# Patient Record
Sex: Female | Born: 1960
Health system: Southern US, Community
[De-identification: ages and names within clinical notes are randomized; demographics above are authoritative.]

## PROBLEM LIST (undated history)

## (undated) DIAGNOSIS — Z973 Presence of spectacles and contact lenses: Secondary | ICD-10-CM

## (undated) DIAGNOSIS — IMO0002 Reserved for concepts with insufficient information to code with codable children: Secondary | ICD-10-CM

## (undated) DIAGNOSIS — Z87442 Personal history of urinary calculi: Secondary | ICD-10-CM

## (undated) DIAGNOSIS — I2510 Atherosclerotic heart disease of native coronary artery without angina pectoris: Secondary | ICD-10-CM

## (undated) DIAGNOSIS — M329 Systemic lupus erythematosus, unspecified: Secondary | ICD-10-CM

## (undated) DIAGNOSIS — R319 Hematuria, unspecified: Secondary | ICD-10-CM

## (undated) DIAGNOSIS — E785 Hyperlipidemia, unspecified: Secondary | ICD-10-CM

## (undated) DIAGNOSIS — Z720 Tobacco use: Secondary | ICD-10-CM

## (undated) DIAGNOSIS — N83209 Unspecified ovarian cyst, unspecified side: Secondary | ICD-10-CM

## (undated) DIAGNOSIS — F329 Major depressive disorder, single episode, unspecified: Secondary | ICD-10-CM

## (undated) DIAGNOSIS — I011 Acute rheumatic endocarditis: Secondary | ICD-10-CM

## (undated) DIAGNOSIS — E119 Type 2 diabetes mellitus without complications: Secondary | ICD-10-CM

## (undated) DIAGNOSIS — R519 Headache, unspecified: Secondary | ICD-10-CM

## (undated) DIAGNOSIS — I10 Essential (primary) hypertension: Secondary | ICD-10-CM

## (undated) DIAGNOSIS — F32A Depression, unspecified: Secondary | ICD-10-CM

## (undated) DIAGNOSIS — L732 Hidradenitis suppurativa: Secondary | ICD-10-CM

## (undated) DIAGNOSIS — N200 Calculus of kidney: Secondary | ICD-10-CM

## (undated) DIAGNOSIS — R350 Frequency of micturition: Secondary | ICD-10-CM

## (undated) DIAGNOSIS — K219 Gastro-esophageal reflux disease without esophagitis: Secondary | ICD-10-CM

## (undated) DIAGNOSIS — I2111 ST elevation (STEMI) myocardial infarction involving right coronary artery: Secondary | ICD-10-CM

## (undated) DIAGNOSIS — J189 Pneumonia, unspecified organism: Secondary | ICD-10-CM

## (undated) DIAGNOSIS — I639 Cerebral infarction, unspecified: Secondary | ICD-10-CM

## (undated) DIAGNOSIS — G4730 Sleep apnea, unspecified: Secondary | ICD-10-CM

## (undated) DIAGNOSIS — M419 Scoliosis, unspecified: Secondary | ICD-10-CM

## (undated) DIAGNOSIS — M069 Rheumatoid arthritis, unspecified: Secondary | ICD-10-CM

## (undated) DIAGNOSIS — Z9889 Other specified postprocedural states: Secondary | ICD-10-CM

## (undated) DIAGNOSIS — I25810 Atherosclerosis of coronary artery bypass graft(s) without angina pectoris: Secondary | ICD-10-CM

## (undated) DIAGNOSIS — E669 Obesity, unspecified: Secondary | ICD-10-CM

## (undated) DIAGNOSIS — F419 Anxiety disorder, unspecified: Secondary | ICD-10-CM

## (undated) DIAGNOSIS — Z95820 Peripheral vascular angioplasty status with implants and grafts: Secondary | ICD-10-CM

## (undated) DIAGNOSIS — R112 Nausea with vomiting, unspecified: Secondary | ICD-10-CM

## (undated) DIAGNOSIS — E236 Other disorders of pituitary gland: Secondary | ICD-10-CM

## (undated) HISTORY — PX: BREAST BIOPSY: SHX20

## (undated) HISTORY — DX: Hyperlipidemia, unspecified: E78.5

## (undated) HISTORY — PX: ABDOMINAL HYSTERECTOMY: SHX81

## (undated) HISTORY — DX: Depression, unspecified: F32.A

## (undated) HISTORY — DX: Cerebral infarction, unspecified: I63.9

## (undated) HISTORY — PX: DILATION AND CURETTAGE OF UTERUS: SHX78

## (undated) HISTORY — DX: Scoliosis, unspecified: M41.9

## (undated) HISTORY — PX: TUBAL LIGATION: SHX77

## (undated) HISTORY — DX: Anxiety disorder, unspecified: F41.9

## (undated) HISTORY — PX: TONSILLECTOMY: SUR1361

## (undated) HISTORY — DX: Major depressive disorder, single episode, unspecified: F32.9

---

## 1997-10-05 ENCOUNTER — Encounter: Admission: RE | Admit: 1997-10-05 | Discharge: 1997-10-05 | Payer: Self-pay | Admitting: Family Medicine

## 1997-11-08 ENCOUNTER — Encounter: Admission: RE | Admit: 1997-11-08 | Discharge: 1997-11-08 | Payer: Self-pay | Admitting: Family Medicine

## 1997-11-15 ENCOUNTER — Encounter: Admission: RE | Admit: 1997-11-15 | Discharge: 1997-11-15 | Payer: Self-pay | Admitting: Family Medicine

## 1997-12-04 ENCOUNTER — Encounter: Admission: RE | Admit: 1997-12-04 | Discharge: 1997-12-04 | Payer: Self-pay | Admitting: Family Medicine

## 1997-12-11 ENCOUNTER — Encounter: Admission: RE | Admit: 1997-12-11 | Discharge: 1997-12-11 | Payer: Self-pay | Admitting: Family Medicine

## 1997-12-11 ENCOUNTER — Other Ambulatory Visit: Admission: RE | Admit: 1997-12-11 | Discharge: 1997-12-11 | Payer: Self-pay | Admitting: *Deleted

## 1997-12-27 ENCOUNTER — Encounter: Admission: RE | Admit: 1997-12-27 | Discharge: 1997-12-27 | Payer: Self-pay | Admitting: Family Medicine

## 1998-01-02 ENCOUNTER — Encounter: Admission: RE | Admit: 1998-01-02 | Discharge: 1998-01-02 | Payer: Self-pay | Admitting: Sports Medicine

## 1998-02-20 ENCOUNTER — Encounter: Admission: RE | Admit: 1998-02-20 | Discharge: 1998-02-20 | Payer: Self-pay | Admitting: Sports Medicine

## 1998-03-02 ENCOUNTER — Encounter: Admission: RE | Admit: 1998-03-02 | Discharge: 1998-03-02 | Payer: Self-pay | Admitting: Family Medicine

## 1998-03-20 ENCOUNTER — Encounter: Admission: RE | Admit: 1998-03-20 | Discharge: 1998-03-20 | Payer: Self-pay | Admitting: Sports Medicine

## 2006-11-19 ENCOUNTER — Encounter: Payer: Self-pay | Admitting: Family Medicine

## 2010-08-21 ENCOUNTER — Inpatient Hospital Stay (INDEPENDENT_AMBULATORY_CARE_PROVIDER_SITE_OTHER)
Admission: RE | Admit: 2010-08-21 | Discharge: 2010-08-21 | Disposition: A | Discharge status: Home / Self Care | Payer: Self-pay | Source: Ambulatory Visit | Attending: Emergency Medicine | Admitting: Emergency Medicine

## 2010-08-21 DIAGNOSIS — I10 Essential (primary) hypertension: Secondary | ICD-10-CM

## 2010-08-21 LAB — POCT I-STAT, CHEM 8
BUN: 9 mg/dL (ref 6–23)
Calcium, Ion: 1.2 mmol/L (ref 1.12–1.32)
Chloride: 105 mEq/L (ref 96–112)
Creatinine, Ser: 0.8 mg/dL (ref 0.4–1.2)
Glucose, Bld: 94 mg/dL (ref 70–99)
HCT: 47 % — ABNORMAL HIGH (ref 36.0–46.0)
Hemoglobin: 16 g/dL — ABNORMAL HIGH (ref 12.0–15.0)
Potassium: 3.3 mEq/L — ABNORMAL LOW (ref 3.5–5.1)
Sodium: 143 mEq/L (ref 135–145)
TCO2: 27 mmol/L (ref 0–100)

## 2011-05-21 ENCOUNTER — Encounter (HOSPITAL_COMMUNITY): Payer: Self-pay | Admitting: *Deleted

## 2011-05-21 ENCOUNTER — Emergency Department (INDEPENDENT_AMBULATORY_CARE_PROVIDER_SITE_OTHER)
Admission: EM | Admit: 2011-05-21 | Discharge: 2011-05-21 | Disposition: A | Discharge status: Home Health | Payer: Self-pay | Source: Home / Self Care | Attending: Family Medicine | Admitting: Family Medicine

## 2011-05-21 DIAGNOSIS — L02411 Cutaneous abscess of right axilla: Secondary | ICD-10-CM

## 2011-05-21 DIAGNOSIS — L0291 Cutaneous abscess, unspecified: Secondary | ICD-10-CM

## 2011-05-21 DIAGNOSIS — IMO0002 Reserved for concepts with insufficient information to code with codable children: Secondary | ICD-10-CM

## 2011-05-21 DIAGNOSIS — L0390 Cellulitis, unspecified: Secondary | ICD-10-CM

## 2011-05-21 HISTORY — DX: Essential (primary) hypertension: I10

## 2011-05-21 HISTORY — DX: Systemic lupus erythematosus, unspecified: M32.9

## 2011-05-21 HISTORY — DX: Reserved for concepts with insufficient information to code with codable children: IMO0002

## 2011-05-21 MED ORDER — HYDROCODONE-ACETAMINOPHEN 5-325 MG PO TABS: ORAL_TABLET | ORAL | Status: AC

## 2011-05-21 MED ORDER — SULFAMETHOXAZOLE-TRIMETHOPRIM 800-160 MG PO TABS: 1.0000 | ORAL_TABLET | Freq: Two times a day (BID) | ORAL | Status: AC

## 2011-05-21 NOTE — ED Notes (Signed)
Pt c/o "knots" under both arms and on breasts for a long time.  States that they come and go, sometimes they drain, sometimes they don't.  States they are painful, can't put deodorant on because of the pain.  Has one on left inner breast that is open, no drainage noted at present time.  Pt has been out of meds for HTN, RA and Lupus for several months.

## 2011-05-21 NOTE — Discharge Instructions (Signed)
Take antibiotics as directed. Apply warm compresses to affected areas 2-3 times a day. Return to care. Should symptoms not improve, or worsen in any way. Or if you have any fever.

## 2011-05-21 NOTE — ED Provider Notes (Signed)
History     CSN: [id number]  Arrival date & time 05/21/11  0804   First MD Initiated Contact with Patient 05/21/11 0813      Chief Complaint  Patient presents with  . Recurrent Skin Infections    (Consider location/radiation/quality/duration/timing/severity/associated sxs/prior treatment) HPI Comments: [name] [name] presents for evaluation of multiple painful, "knots" underneath her right armpit. She also reports 1 over her left breast. She reports a 4 year history of these abscesses. She reports that they mostly spontaneously drained themselves and, that she's never seen a provider for these.  [name] presenting with abscess. The history is provided by the patient.  Abscess  This is a recurrent problem. The problem occurs frequently. The problem has been unchanged. The abscess is present on the right arm (LEFT breast and RIGHT axilla). The abscess is characterized by painfulness and draining.    Past Medical History  Diagnosis Date  . Arthritis   . Hypertension   . Lupus     Past Surgical History  Procedure Date  . Abdominal hysterectomy   . Breast biopsy     No family history on file.  History  Substance Use Topics  . Smoking status: Current Everyday Smoker -- 0.5 packs/day    Types: Cigarettes  . Smokeless tobacco: Not on file  . Alcohol Use: No    OB History    Grav Para Term Preterm Abortions TAB SAB Ect Mult Living                  Review of Systems  Constitutional: Negative.   HENT: Negative.   Eyes: Negative.   Respiratory: Negative.   Cardiovascular: Negative.   Gastrointestinal: Negative.   Genitourinary: Negative.   Musculoskeletal: Negative.   Skin: Negative.        Abscesses   Neurological: Negative.     Allergies  Review of patient's allergies indicates no known allergies.  Home Medications   Current Outpatient Rx  Name Route Sig Dispense Refill  . ACETAMINOPHEN ER 650 MG PO TBCR Oral Take 1,300 mg by mouth every 8  (eight) hours as needed.    Marland Kitchen HYDROCODONE-ACETAMINOPHEN 5-325 MG PO TABS  Take one to two tablets every 4 to 6 hours as needed for pain 20 tablet 0  . SULFAMETHOXAZOLE-TRIMETHOPRIM 800-160 MG PO TABS Oral Take 1 tablet by mouth 2 (two) times daily. 14 tablet 0    BP 175/96  Pulse 90  Temp(Src) 98 F (36.7 C) (Oral)  Resp 18  SpO2 96%  Physical Exam  Nursing note and vitals reviewed. Constitutional: She is oriented to person, place, and time. She appears well-developed and well-nourished.  HENT:  Head: Normocephalic and atraumatic.  Eyes: EOM are normal.  Neck: Normal range of motion.  Pulmonary/Chest: Effort normal.  Musculoskeletal: Normal range of motion.  Neurological: She is alert and oriented to person, place, and time.  Skin: Skin is warm and dry. Lesion noted.          Multiple abscesses beneath RIGHT axilla; one 1 cm lesion over LEFT breast near sternal border, draining bloody and purulent discharge  Psychiatric: Her behavior is normal.    ED Course  INCISION AND DRAINAGE Date/Time: 05/21/2011 9:20 AM Performed by: Richardo Priest Authorized by: Richardo Priest Consent: Verbal consent obtained. Risks and benefits: risks, benefits and alternatives were discussed Consent given by: patient Patient understanding: patient states understanding of the procedure being performed Site marked: the operative site was marked Patient identity confirmed: verbally  with patient and arm band Type: abscess Body area: upper extremity (RIGHT axilla) Anesthesia: local infiltration Local anesthetic: lidocaine 2% without epinephrine Anesthetic total: 4 ml Patient sedated: no Scalpel size: 11 Needle gauge: 27. Incision type: single straight Complexity: simple Drainage: bloody Drainage amount: scant Wound treatment: wound left open   (including critical care time)  Labs Reviewed - No data to display No results found.   1. Abscess of axilla, right   2. Abscess and cellulitis        MDM  I&D RIGHT axillary abscess; rx for TMP-SMX given with hydrocodone PRN; warm compresses, return if no improvement        Richardo Priest, MD 05/21/11 [phone]

## 2011-07-28 ENCOUNTER — Emergency Department (HOSPITAL_COMMUNITY): Payer: Self-pay

## 2011-07-28 ENCOUNTER — Emergency Department (HOSPITAL_COMMUNITY)
Admission: EM | Admit: 2011-07-28 | Discharge: 2011-07-28 | Disposition: A | Discharge status: Home / Self Care | Payer: Self-pay | Attending: Emergency Medicine | Admitting: Emergency Medicine

## 2011-07-28 ENCOUNTER — Encounter (HOSPITAL_COMMUNITY): Payer: Self-pay | Admitting: Emergency Medicine

## 2011-07-28 DIAGNOSIS — R0602 Shortness of breath: Secondary | ICD-10-CM | POA: Insufficient documentation

## 2011-07-28 DIAGNOSIS — R079 Chest pain, unspecified: Secondary | ICD-10-CM | POA: Insufficient documentation

## 2011-07-28 DIAGNOSIS — Z8739 Personal history of other diseases of the musculoskeletal system and connective tissue: Secondary | ICD-10-CM | POA: Insufficient documentation

## 2011-07-28 DIAGNOSIS — I10 Essential (primary) hypertension: Secondary | ICD-10-CM | POA: Insufficient documentation

## 2011-07-28 DIAGNOSIS — R51 Headache: Secondary | ICD-10-CM | POA: Insufficient documentation

## 2011-07-28 DIAGNOSIS — M329 Systemic lupus erythematosus, unspecified: Secondary | ICD-10-CM | POA: Insufficient documentation

## 2011-07-28 LAB — BASIC METABOLIC PANEL
BUN: 9 mg/dL (ref 6–23)
CO2: 28 mEq/L (ref 19–32)
Calcium: 10 mg/dL (ref 8.4–10.5)
Chloride: 101 mEq/L (ref 96–112)
Creatinine, Ser: 0.72 mg/dL (ref 0.50–1.10)
GFR calc Af Amer: >90 mL/min (ref >90)
GFR calc non Af Amer: >90 mL/min (ref >90)
Glucose, Bld: 84 mg/dL (ref 70–99)
Potassium: 3.9 mEq/L (ref 3.5–5.1)
Sodium: 139 mEq/L (ref 135–145)

## 2011-07-28 LAB — CBC
HCT: 47.2 % — ABNORMAL HIGH (ref 36.0–46.0)
Hemoglobin: 15.9 g/dL — ABNORMAL HIGH (ref 12.0–15.0)
MCH: 29.8 pg (ref 26.0–34.0)
MCHC: 33.7 g/dL (ref 30.0–36.0)
MCV: 88.6 fL (ref 78.0–100.0)
Platelets: 335 10*3/uL (ref 150–400)
RBC: 5.33 MIL/uL — ABNORMAL HIGH (ref 3.87–5.11)
RDW: 15.3 % (ref 11.5–15.5)
WBC: 11.7 10*3/uL — ABNORMAL HIGH (ref 4.0–10.5)

## 2011-07-28 LAB — TROPONIN I: Troponin I: <0.3 ng/mL (ref <0.30)

## 2011-07-28 MED ORDER — HYDROCHLOROTHIAZIDE 25 MG PO TABS: 25.0000 mg | ORAL_TABLET | Freq: Every day | ORAL | Status: DC

## 2011-07-28 MED ORDER — LISINOPRIL-HYDROCHLOROTHIAZIDE 20-12.5 MG PO TABS: 1.0000 | ORAL_TABLET | Freq: Every day | ORAL | Status: DC

## 2011-07-28 MED ORDER — KETOROLAC TROMETHAMINE 30 MG/ML IJ SOLN
30.0000 mg | Freq: Once | INTRAMUSCULAR | Status: AC
  Administered 2011-07-28: 30 mg via INTRAVENOUS
  Filled 2011-07-28: qty 1

## 2011-07-28 MED ORDER — SODIUM CHLORIDE 0.9 % IV BOLUS (SEPSIS)
1000.0000 mL | Freq: Once | INTRAVENOUS | Status: AC
  Administered 2011-07-28: 1000 mL via INTRAVENOUS

## 2011-07-28 NOTE — Discharge Instructions (Signed)
Return here if any worsening in your condition.  Followup with resources below her primary doctor.    RESOURCE GUIDE  Dental Problems  Patients with Medicaid: [facility] [phone]                                           [phone] [address] Phone:  [phone]                                                  Phone:  [phone]  If unable to pay or uninsured, contact:  [facility] or [facility]. to become qualified for the adult dental clinic.  Chronic Pain Problems Contact [facility]  [phone] Patients need to be referred by their primary care doctor.  Insufficient Money for Medicine Contact [employer]:  call "211" or [facility] [phone].  No Primary Care Doctor Call [facility]  [phone] Other agencies that provide inexpensive medical care    [facility]  [phone]    [facility]  [phone]    [facility]  [phone]    [facility]  [phone]    [facility]  [phone]    [facility]  [phone]  Psychological Services [facility]  [phone] [facility] Services  [phone] [facility]   [phone] (emergency services [phone])  Substance Abuse Resources [facility]  [phone] [facility] 7813866391 [facility] [place] [phone] Floydene Flock [phone] Residential & Outpatient Substance Abuse Program  [phone]  Abuse/Neglect [facility] Child Abuse Hotline 4097242023 [facility] Child Abuse Hotline 650-512-5958 (After Hours)  Emergency Shelter [facility] [phone]  Maternity Homes [facility] at [facility] [place] [facility] [place] [phone] [facility] [phone]  MRSA Hotline #:   505-290-7142    [facility] Resources  [facility]     [employer]                           [facility] Dept. [address] [place]                       [address]      [address]                                                  Cristobal Goldmann Phone:  [phone]                                   Phone:  [phone]  Phone:  [phone]  [facility] Phone:  [phone]  [facility] Child Abuse Hotline [phone])050-1860 [phone] (After Hours)

## 2011-07-28 NOTE — ED Provider Notes (Signed)
History     CSN: [id number]  Arrival date & time 07/28/11  1652   First MD Initiated Contact with Patient 07/28/11 2035      Chief Complaint  Patient presents with  . Hypertension  . Chest Pain    (Consider location/radiation/quality/duration/timing/severity/associated sxs/prior treatment) HPI Patient presents emergency department with elevated blood pressure and headache for the last 3 days.  She states she chronically has headache, and she states, that she has not had her blood pressure medicines in about a year.  Patient states that she gets off and on, heaviness in her chest and has been gone for a long time and that is not a new issue.  She, states she has been seen for this in the past.  Patient states this, time.  She is not having any problems with that issue here in the emergency department.  Patient states that the headache is spread throughout her entire head as a dull, throbbing headache.  She denies visual changes, weakness, difficulty urinating, nausea, vomiting, abdominal pain, shortness of breath, back pain, fever, or dizziness.  She states that the light seems to bother her eyes and makes her headache, worse.  No other aggravating factors.  Patient states that nothing seems to make her headache better.  Past Medical History  Diagnosis Date  . Arthritis   . Hypertension   . Lupus     Past Surgical History  Procedure Date  . Abdominal hysterectomy   . Breast biopsy     History reviewed. No pertinent family history.  History  Substance Use Topics  . Smoking status: Current Everyday Smoker -- 0.5 packs/day    Types: Cigarettes  . Smokeless tobacco: Not on file  . Alcohol Use: No    OB History    Grav Para Term Preterm Abortions TAB SAB Ect Mult Living                  Review of Systems All other systems negative except as documented in the HPI. All pertinent positives and negatives as reviewed in the HPI.  Allergies  Review of patient's allergies  indicates no known allergies.  Home Medications   Current Outpatient Rx  Name Route Sig Dispense Refill  . ACETAMINOPHEN ER 650 MG PO TBCR Oral Take 1,300 mg by mouth every 8 (eight) hours as needed. For pain      BP 153/91  Pulse 73  Temp(Src) 97.4 F (36.3 C) (Oral)  Resp 18  SpO2 99%  Physical Exam Physical Examination: General appearance - alert, well appearing, and in no distress, oriented to person, place, and time and overweight Mental status - alert, oriented to person, place, and time, normal mood, behavior, speech, dress, motor activity, and thought processes Eyes - pupils equal and reactive, extraocular eye movements intact Ears - bilateral TM's and external ear canals normal Nose - normal and patent, no erythema, discharge or polyps Mouth - mucous membranes moist, pharynx normal without lesions Neck - supple, no significant adenopathy Chest - clear to auscultation, no wheezes, rales or rhonchi, symmetric air entry, no tachypnea, retractions or cyanosis Heart - normal rate, regular rhythm, normal S1, S2, no murmurs, rubs, clicks or gallops Neurological - alert, oriented, normal speech, no focal findings or movement disorder noted, neck supple without rigidity, DTR's normal and symmetric, motor and sensory grossly normal bilaterally, normal muscle tone, no tremors, strength 5/5  ED Course  Procedures (including critical care time)  Labs Reviewed  CBC - Abnormal; Notable for the  following:    WBC 11.7 (*)    RBC 5.33 (*)    Hemoglobin 15.9 (*)    HCT 47.2 (*)    All other components within normal limits  BASIC METABOLIC PANEL  TROPONIN I   Dg Chest 2 View  07/28/2011  *RADIOLOGY REPORT*  Clinical Data: Chest tightness with shortness of breath and cough. Smoker.  History of rheumatoid arthritis and systemic lupus erythematosus.  CHEST - 2 VIEW  Comparison: None.  Findings: The heart size is at the upper limits of normal.  The mediastinal contours are normal.  The  lungs are clear.  There is no pleural effusion or pneumothorax.  A mild scoliosis is noted.  No acute osseous findings are seen.  IMPRESSION: Borderline heart size.  No acute cardiopulmonary process.  Original Report Authenticated By: Gerrianne Scale, M.D.   Patient is not displaying any signs of hypertensive crisis or emergency at this time.  Patient states that this headache is similar to her chronic headaches, but over the last 3 days it seems constant.  She is not having a chest pain, shortness of breath at this time.  Her blood pressure last check was 153 systolically.  Patient is advised that she is going to need a followup with her primary doctor.   MDM   MDM Reviewed: nursing note and vitals Interpretation: labs and x-ray           Carlyle Dolly, [name] 07/28/11 2246

## 2011-07-28 NOTE — ED Notes (Signed)
Reports for last 3 days, having headache, checked BP today 182/115, and pulse 80; reports recent stressors, and has not had BP meds in over year ; reports heaviness in chest and nausea, tingling in L fingers

## 2011-07-28 NOTE — ED Notes (Signed)
rx x 1, pt voiced understanding to f/u with PCP 

## 2011-07-29 NOTE — ED Provider Notes (Signed)
Medical screening examination/treatment/procedure(s) were performed by non-physician practitioner and as supervising physician I was immediately available for consultation/collaboration.  Tayvin Preslar L Karys Meckley, MD 07/29/11 0029 

## 2011-12-09 ENCOUNTER — Emergency Department (HOSPITAL_COMMUNITY)
Admission: EM | Admit: 2011-12-09 | Discharge: 2011-12-09 | Disposition: A | Discharge status: Home / Self Care | Payer: Medicare Other | Attending: Emergency Medicine | Admitting: Emergency Medicine

## 2011-12-09 ENCOUNTER — Encounter (HOSPITAL_COMMUNITY): Payer: Self-pay | Admitting: Emergency Medicine

## 2011-12-09 DIAGNOSIS — I10 Essential (primary) hypertension: Secondary | ICD-10-CM

## 2011-12-09 DIAGNOSIS — L089 Local infection of the skin and subcutaneous tissue, unspecified: Secondary | ICD-10-CM | POA: Insufficient documentation

## 2011-12-09 DIAGNOSIS — L02411 Cutaneous abscess of right axilla: Secondary | ICD-10-CM

## 2011-12-09 DIAGNOSIS — F17200 Nicotine dependence, unspecified, uncomplicated: Secondary | ICD-10-CM | POA: Insufficient documentation

## 2011-12-09 MED ORDER — HYDROCHLOROTHIAZIDE 25 MG PO TABS: 25.0000 mg | ORAL_TABLET | Freq: Every day | ORAL | Status: DC

## 2011-12-09 MED ORDER — SULFAMETHOXAZOLE-TRIMETHOPRIM 800-160 MG PO TABS: 2.0000 | ORAL_TABLET | Freq: Two times a day (BID) | ORAL | Status: DC

## 2011-12-09 MED ORDER — HYDROCODONE-ACETAMINOPHEN 5-325 MG PO TABS: 1.0000 | ORAL_TABLET | ORAL | Status: DC | PRN

## 2011-12-09 MED ORDER — HYDROCODONE-ACETAMINOPHEN 5-325 MG PO TABS
1.0000 | ORAL_TABLET | Freq: Once | ORAL | Status: AC
  Administered 2011-12-09: 1 via ORAL
  Filled 2011-12-09: qty 1

## 2011-12-09 NOTE — ED Provider Notes (Signed)
History     CSN: [id number]  Arrival date & time 12/09/11  1151   First MD Initiated Contact with Patient 12/09/11 1226      Chief Complaint  Patient presents with  . Abscess    (Consider location/radiation/quality/duration/timing/severity/associated sxs/prior treatment) HPI Comments: Patient with hx lupus and rheumatoid arthritis, currently not receiving treatment, reports abscesses in right and left axilla, as well as lesions along her upper thighs.  States she has been getting these intermittently since 2008 when she was diagnosed with lupus.  The lesions on her thighs began in the past few days.  The lesion in her right axilla has been present x 3 weeks - states it is painful, sore, has been increasing in size.  Denies any drainage, denies fevers.  States the lesions usually pop and drain spontaneously, but she has had one I&D of abscess in the left axilla some time ago.    [name] presenting with abscess. The history is provided by the patient.  Abscess  Pertinent negatives include no fever.    Past Medical History  Diagnosis Date  . Arthritis   . Hypertension   . Lupus     Past Surgical History  Procedure Date  . Abdominal hysterectomy   . Breast biopsy     History reviewed. No pertinent family history.  History  Substance Use Topics  . Smoking status: Current Every Day Smoker -- 0.5 packs/day    Types: Cigarettes  . Smokeless tobacco: Not on file  . Alcohol Use: No    OB History    Grav Para Term Preterm Abortions TAB SAB Ect Mult Living                  Review of Systems  Constitutional: Negative for fever and chills.  Musculoskeletal: Negative for myalgias.  Skin: Positive for color change.    Allergies  Review of patient's allergies indicates no known allergies.  Home Medications   Current Outpatient Rx  Name Route Sig Dispense Refill  . ACETAMINOPHEN ER 650 MG PO TBCR Oral Take 1,300 mg by mouth every 8 (eight) hours as  needed. For pain    . LISINOPRIL-HYDROCHLOROTHIAZIDE 20-12.5 MG PO TABS Oral Take 1 tablet by mouth daily. 30 tablet 0    BP 176/104  Pulse 86  Temp 97.9 F (36.6 C) (Oral)  Resp 16  SpO2 94%  Physical Exam  Nursing note and vitals reviewed. Constitutional: She appears well-developed and well-nourished. No distress.  HENT:  Head: Normocephalic and atraumatic.  Neck: Neck supple.  Pulmonary/Chest: Effort normal.  Neurological: She is alert.  Skin: She is not diaphoretic.       ED Course  Procedures (including critical care time)  Labs Reviewed - No data to display No results found.   Filed Vitals:   12/09/11 1155  BP: 176/104  Pulse: 86  Temp: 97.9 F (36.6 C)  Resp: 16   INCISION AND DRAINAGE Performed by: Trixie Dredge B Consent: Verbal consent obtained. Risks and benefits: risks, benefits and alternatives were discussed Type: abscess  Body area: right axilla  Anesthesia: local infiltration  Local anesthetic: lidocaine 2% with epinephrine  Anesthetic total: 6 ml  Complexity: complex Blunt dissection to break up loculations  Drainage: purulent  Drainage amount: moderate  Packing material: 1/4 in iodoform gauze  Patient tolerance: Patient tolerated the procedure well with no immediate complications.  2:01 PM I had a long discussion with the patient about hypertension.  Pt is  currently in 8/10 pain, has been declining pain medications.  States she does have a headache but this is her typical headache that she has had for years, it is unchanged.  Denies CP, SOB, or focal neurological deficits.  No AMS.  Patient is aware of the risks of hypertension.  States that her blood pressure is always elevated, even at home, and is usually around 160/100.  She was previously on lisinopril and HCTZ, but has not been for many years. Pt currently does not have PCP.  Though I believe BP is elevated in part because of pain, the patient knows her pressure is elevated at home  when she is not in pain and is able to tell me numbers.  I therefore have prescribed 25mg  HCTZ for her and have asked her to continue to work to find a PCP (I have given her resources) and to have her BP rechecked within a week.    1. Abscess of right axilla   2. Skin pustule   3. Hypertension       MDM  Pt with right axillary abscess, also with 3 pustules on thigh and small early abscess in left axilla.   Pt is afebrile, nontoxic.  I&D performed in ED.  Left axillary abscess is very small, I do not think it would benefit from I&D attempt at this time.  Pt also found to be hypertensive, please see discussion above.  No CP, SOB, AMS.  Though home medications state patient has lisinopril-HCTZ, pt states she has not been on medications for "a long time."  Pt to have two day follow up in ED for packing removal and recheck of abscess.  Also will need BP recheck within one week.  Discussed diagnosis, care plan, follow up with patient.  Pt given return precautions.  Pt verbalizes understanding and agrees with plan.           Marlene [place], [place] 12/09/11 1407

## 2011-12-09 NOTE — Discharge Instructions (Signed)
Read the information below.  Please return to the ER or your primary care provider in two days for an abscess recheck and packing removal.  If you develop increased redness, swelling, red streaks coming from the area, or fevers greater than 100.4, return to the ER immediately for a recheck.  Use warm moist compresses several times daily on your other lesions and any new lesions in the future.  Please have your blood pressure rechecked within the next week.  Use the prescribed medication as directed.  Please discuss all new medications with your pharmacist.  Do not take additional tylenol while taking the prescribed pain medication to avoid overdose.  You may return to the Emergency Department at any time for worsening condition or any new symptoms that concern you.    Abscess An abscess (boil or furuncle) is an infected area that contains a collection of pus.  SYMPTOMS Signs and symptoms of an abscess include pain, tenderness, redness, or hardness. You may feel a moveable soft area under your skin. An abscess can occur anywhere in the body.  TREATMENT  A surgical cut (incision) may be made over your abscess to drain the pus. Gauze may be packed into the space or a drain may be looped through the abscess cavity (pocket). This provides a drain that will allow the cavity to heal from the inside outwards. The abscess may be painful for a few days, but should feel much better if it was drained.  Your abscess, if seen early, may not have localized and may not have been drained. If not, another appointment may be required if it does not get better on its own or with medications. HOME CARE INSTRUCTIONS   Only take over-the-counter or prescription medicines for pain, discomfort, or fever as directed by your caregiver.   Take your antibiotics as directed if they were prescribed. Finish them even if you start to feel better.   Keep the skin and clothes clean around your abscess.   If the abscess was drained,  you will need to use gauze dressing to collect any draining pus. Dressings will typically need to be changed 3 or more times a day.   The infection may spread by skin contact with others. Avoid skin contact as much as possible.   Practice good hygiene. This includes regular hand washing, cover any draining skin lesions, and do not share personal care items.   If you participate in sports, do not share athletic equipment, towels, whirlpools, or personal care items. Shower after every practice or tournament.   If a draining area cannot be adequately covered:   Do not participate in sports.   Children should not participate in day care until the wound has healed or drainage stops.   If your caregiver has given you a follow-up appointment, it is very important to keep that appointment. Not keeping the appointment could result in a much worse infection, chronic or permanent injury, pain, and disability. If there is any problem keeping the appointment, you must call back to this facility for assistance.  SEEK MEDICAL CARE IF:   You develop increased pain, swelling, redness, drainage, or bleeding in the wound site.   You develop signs of generalized infection including muscle aches, chills, fever, or a general ill feeling.   You have an oral temperature above 102 F (38.9 C).  MAKE SURE YOU:   Understand these instructions.   Will watch your condition.   Will get help right away if you are not  doing well or get worse.  Document Released: 12/11/2004 Document Revised: 02/20/2011 Document Reviewed: 10/05/2007 [facility] Patient Information 2012 [place], [place].  Abscess Care After An abscess (also called a boil or furuncle) is an infected area that contains a collection of pus. Signs and symptoms of an abscess include pain, tenderness, redness, or hardness, or you may feel a moveable soft area under your skin. An abscess can occur anywhere in the body. The infection may spread to surrounding  tissues causing cellulitis. A cut (incision) by the surgeon was made over your abscess and the pus was drained out. Gauze may have been packed into the space to provide a drain that will allow the cavity to heal from the inside outwards. The boil may be painful for 5 to 7 days. Most people with a boil do not have high fevers. Your abscess, if seen early, may not have localized, and may not have been lanced. If not, another appointment may be required for this if it does not get better on its own or with medications. HOME CARE INSTRUCTIONS   Only take over-the-counter or prescription medicines for pain, discomfort, or fever as directed by your caregiver.   When you bathe, soak and then remove gauze or iodoform packs at least daily or as directed by your caregiver. You may then wash the wound gently with mild soapy water. Repack with gauze or do as your caregiver directs.  SEEK IMMEDIATE MEDICAL CARE IF:   You develop increased pain, swelling, redness, drainage, or bleeding in the wound site.   You develop signs of generalized infection including muscle aches, chills, fever, or a general ill feeling.   An oral temperature above 102 F (38.9 C) develops, not controlled by medication.  See your caregiver for a recheck if you develop any of the symptoms described above. If medications (antibiotics) were prescribed, take them as directed. Document Released: 09/19/2004 Document Revised: 02/20/2011 Document Reviewed: 05/17/2007 [facility] Patient Information 2012 [place], [place].  RESOURCE GUIDE  Chronic Pain Problems: Contact [facility]  [phone] Patients need to be referred by their primary care doctor.  Insufficient Money for Medicine: Contact United Way:  call "211" or [facility] 9783020238.  No Primary Care Doctor: - Call [facility]  [phone] - can help you locate a primary care doctor that  accepts your insurance, provides certain services, etc. - Physician  Referral [phone]  Agencies that provide inexpensive medical care: - [facility]  [phone] - [facility]  [phone] - [facility]  [phone] [facility]  [phone] - Planned Parenthood  [phone] [facility]  [phone]  Medicaid-accepting [facility] Providers: - [facility]- [address]  [phone], Mon-Fri 9am-7pm, Sat 9am-1pm - [facility]- [address] [place], [address] [place]  [phone] - [facility]- [address], [address] [place]  [phone] [facility]- [address]  [phone] - Renaye Rakers- [address] [place], [address], [phone]  Only accepts [place] patients after they have their name  applied to their card  Self Pay (no insurance) in [place]: - Sickle Cell Patients: Dr Willey Blade, [facility]  [address] [place], [phone] - [facility]- [address] [place]  [phone]       [facility]- [address] [phone]  S, [address]       -     [employer]- see information above (Speak to [employer] if you do not have insurance)       -  [facility]- [address] [place], [phone]       -  [facility]- [address],  [phone]       -  [facility]- [address], [phone]       -  Dr Julio Sicks-  [address], [address], [place], [phone]       -  [facility]- [address], [phone]       -  [facility]- [address] [phone], also [address], [phone]       -    [facility]- [address] [place], [phone], 1st & 3rd Saturday   every month, 10am-1pm  1) Find a Doctor and Pay Out of Pocket Although you won't have to find out who is covered by your insurance plan, it is a good idea to ask around and get recommendations. You will then need to  call the office and see if the doctor you have chosen will accept you as a new patient and what types of options they offer for patients who are self-pay. Some doctors offer discounts or will set up payment plans for their patients who do not have insurance, but you will need to ask so you aren't surprised when you get to your appointment.  2) Contact Your Local Health Department Not all health departments have doctors that can see patients for sick visits, but many do, so it is worth a call to see if yours does. If you don't know where your local health department is, you can check in your phone book. The CDC also has a tool to help you locate your state's health department, and many state websites also have listings of all of their local health departments.  3) Find a Walk-in Clinic If your illness is not likely to be very severe or complicated, you may want to try a walk in clinic. These are popping up all over the country in pharmacies, drugstores, and shopping centers. They're usually staffed by nurse practitioners or physician assistants that have been trained to treat common illnesses and complaints. They're usually fairly quick and inexpensive. However, if you have serious medical issues or chronic medical problems, these are probably not your best option  STD Testing - [facility] Department of [facility], STD Clinic, [address], [place], phone [phone] or [phone].  Monday - Friday, call for an appointment. [facility] Department of [employer], STD Clinic, [address], [place], phone [phone] or [phone].  Monday - Friday, call for an appointment.  Abuse/Neglect: [facility] Child Abuse Hotline 934-263-2261 [facility] Child Abuse Hotline 434-267-1315 (After Hours)  Emergency Shelter:  [facility] 514-013-8528  Maternity Homes: - [facility] [place] [facility] [phone] - [facility] [phone]  MRSA Hotline #:   517-626-3669  [facility] Resources  [facility]  United Way [facility] Dept. [address]                 [address]         [address] [place] [address]  [address]  Cristobal Goldmann Phone:  [phone]                                  Phone:  [phone]                   Phone:  [phone]  [facility], [phone] - [facility] - [phone]       -     [facility] in [place], [address],                                  [phone], [facility] Child Abuse Hotline 207-510-7551 or 9381185814 (After Hours)   Behavioral Health Services  Substance Abuse Resources: - [facility] Drug [facility]  671-112-8171 - [facility] [phone] - [facility] [phone] Floydene Flock [phone] - Residential & Outpatient Substance Abuse Program  [phone]  Psychological Services: [facility]  334-800-2321 [employer] Services  [phone] - [facility], [phone] [place]. [address] [place], ACCESS LINE: (604)[phone] or (828) [phone], [employer]  Dental Assistance  If unable to pay or uninsured, contact:  [facility] or [facility]. to become qualified for the adult dental clinic.  Patients with Medicaid: [facility] [phone] [address], [phone], [phone]  If unable to pay, or uninsured, contact [facility] [phone]) [phone]) or [facility] Department [phone] in [place], [phone] in [facility]) to become qualified for the adult dental clinic  Other Low-Cost Community Dental Services: - [facility]- [address] [place], [place], [place], [address], [phone],  [address], 2nd and 4th Thursday of the month at 6:30am.  10 clients each day by appointment, can sometimes see walk-in patients if someone does not show for an appointment. [facility]- [address] [place], [place], [address], [phone] - [facility]- [address] [place], [place], [address], [phone] - [facility]- [phone] [facility] Department- [phone] [facility] Department- [phone]

## 2011-12-09 NOTE — ED Notes (Signed)
Pt c/o multiple axillary abscess to left and right side with the more severe on right with pain and swelling

## 2011-12-10 NOTE — ED Provider Notes (Signed)
Medical screening examination/treatment/procedure(s) were performed by non-physician practitioner and as supervising physician I was immediately available for consultation/collaboration.   Lilah Mijangos Y. Daveena Elmore, MD 12/10/11 1343 

## 2011-12-12 ENCOUNTER — Encounter (HOSPITAL_COMMUNITY): Payer: Self-pay | Admitting: Physical Medicine and Rehabilitation

## 2011-12-12 ENCOUNTER — Emergency Department (HOSPITAL_COMMUNITY)
Admission: EM | Admit: 2011-12-12 | Discharge: 2011-12-12 | Disposition: A | Discharge status: Home / Self Care | Payer: Medicare Other | Attending: Emergency Medicine | Admitting: Emergency Medicine

## 2011-12-12 DIAGNOSIS — I10 Essential (primary) hypertension: Secondary | ICD-10-CM | POA: Insufficient documentation

## 2011-12-12 DIAGNOSIS — Z4800 Encounter for change or removal of nonsurgical wound dressing: Secondary | ICD-10-CM | POA: Insufficient documentation

## 2011-12-12 DIAGNOSIS — M329 Systemic lupus erythematosus, unspecified: Secondary | ICD-10-CM | POA: Insufficient documentation

## 2011-12-12 DIAGNOSIS — IMO0002 Reserved for concepts with insufficient information to code with codable children: Secondary | ICD-10-CM | POA: Insufficient documentation

## 2011-12-12 DIAGNOSIS — F17200 Nicotine dependence, unspecified, uncomplicated: Secondary | ICD-10-CM | POA: Insufficient documentation

## 2011-12-12 DIAGNOSIS — M129 Arthropathy, unspecified: Secondary | ICD-10-CM | POA: Insufficient documentation

## 2011-12-12 NOTE — ED Notes (Signed)
Pt presents to department for evaluation of wound re-check. States she had R axillary abscess drained 12/09/11. Wound healing without difficulty. No drainage noted. Denies pain at the time. She is alert and oriented x4. No signs of distress at the time.

## 2011-12-12 NOTE — ED Notes (Signed)
Wound to left axilla opened on Tuesday, today looks well. Packing has came out. Drainage minimal.

## 2011-12-12 NOTE — ED Provider Notes (Signed)
History     CSN: [id number]  Arrival date & time 12/12/11  1301   First MD Initiated Contact with Patient 12/12/11 1501      Chief Complaint  Patient presents with  . Wound Check    (Consider location/radiation/quality/duration/timing/severity/associated sxs/prior treatment) HPI Comments: 51 year old female presents emergency department for recheck and packing removal from an abscess under her right axilla that was drained on September 24. She denies worsening of the abscess. Denies increased redness or swelling. States her pain is a lot less than it was before she had it drained. Denies fever or chills, nausea or vomiting. She is still taking the antibiotics as directed.  [name] presenting with wound check. The history is provided by the patient.  Wound Check     Past Medical History  Diagnosis Date  . Arthritis   . Hypertension   . Lupus     Past Surgical History  Procedure Date  . Abdominal hysterectomy   . Breast biopsy     No family history on file.  History  Substance Use Topics  . Smoking status: Current Every Day Smoker -- 0.5 packs/day    Types: Cigarettes  . Smokeless tobacco: Not on file  . Alcohol Use: No    OB History    Grav Para Term Preterm Abortions TAB SAB Ect Mult Living                  Review of Systems  Constitutional: Negative for fever, chills and activity change.  Gastrointestinal: Negative for nausea and vomiting.  Musculoskeletal: Negative for arthralgias.  Skin: Negative for color change.    Allergies  Review of patient's allergies indicates no known allergies.  Home Medications   Current Outpatient Rx  Name Route Sig Dispense Refill  . ACETAMINOPHEN ER 650 MG PO TBCR Oral Take 1,300 mg by mouth every 8 (eight) hours as needed. For pain    . HYDROCHLOROTHIAZIDE 25 MG PO TABS Oral Take 1 tablet (25 mg total) by mouth daily. 30 tablet 0  . HYDROCODONE-ACETAMINOPHEN 5-325 MG PO TABS Oral Take 1 tablet by  mouth every 4 (four) hours as needed for pain. 10 tablet 0  . SULFAMETHOXAZOLE-TRIMETHOPRIM 800-160 MG PO TABS Oral Take 2 tablets by mouth 2 (two) times daily. 20 tablet 0    BP 158/91  Pulse 100  Temp 98.5 F (36.9 C) (Oral)  Resp 18  SpO2 95%  Physical Exam  Constitutional: She is oriented to person, place, and time. She appears well-developed and well-nourished. No distress.  HENT:  Head: Normocephalic and atraumatic.  Mouth/Throat: Oropharynx is clear and moist.  Eyes: Conjunctivae normal are normal.  Neck: Normal range of motion.  Cardiovascular: Normal rate, regular rhythm and normal heart sounds.   Pulmonary/Chest: Effort normal and breath sounds normal.  Musculoskeletal: Normal range of motion. She exhibits no edema.  Lymphadenopathy:    She has no axillary adenopathy.  Neurological: She is alert and oriented to person, place, and time.  Skin: Skin is warm and dry. She is not diaphoretic.       5 mm lesion present under right axilla without induration. Packing removed. No active drainage. No edema or erythema. Mild tenderness to palpation  Psychiatric: She has a normal mood and affect. Her behavior is normal.    ED Course  Procedures (including critical care time)  Labs Reviewed - No data to display No results found.   1. Abscess packing removal  MDM  51 year old female presenting for packing removal. Abscess is healing well. No erythema, edema or induration. No active drainage. Packing removed without any problem. Reinforced the importance of continuing the entire course of antibiotics. Close return precautions discussed.        Trevor Mace, [name] 12/12/11 1530

## 2011-12-13 NOTE — ED Provider Notes (Signed)
Medical screening examination/treatment/procedure(s) were performed by non-physician practitioner and as supervising physician I was immediately available for consultation/collaboration.   Hurman Horn, MD 12/13/11 2126

## 2011-12-27 ENCOUNTER — Emergency Department (HOSPITAL_COMMUNITY): Payer: Medicare Other

## 2011-12-27 ENCOUNTER — Encounter (HOSPITAL_COMMUNITY): Payer: Self-pay | Admitting: *Deleted

## 2011-12-27 ENCOUNTER — Emergency Department (HOSPITAL_COMMUNITY)
Admission: EM | Admit: 2011-12-27 | Discharge: 2011-12-27 | Disposition: A | Discharge status: Home / Self Care | Payer: Medicare Other | Attending: Emergency Medicine | Admitting: Emergency Medicine

## 2011-12-27 DIAGNOSIS — I10 Essential (primary) hypertension: Secondary | ICD-10-CM | POA: Insufficient documentation

## 2011-12-27 DIAGNOSIS — J069 Acute upper respiratory infection, unspecified: Secondary | ICD-10-CM | POA: Insufficient documentation

## 2011-12-27 DIAGNOSIS — J329 Chronic sinusitis, unspecified: Secondary | ICD-10-CM

## 2011-12-27 DIAGNOSIS — M329 Systemic lupus erythematosus, unspecified: Secondary | ICD-10-CM | POA: Insufficient documentation

## 2011-12-27 DIAGNOSIS — M129 Arthropathy, unspecified: Secondary | ICD-10-CM | POA: Insufficient documentation

## 2011-12-27 DIAGNOSIS — B349 Viral infection, unspecified: Secondary | ICD-10-CM

## 2011-12-27 DIAGNOSIS — F17200 Nicotine dependence, unspecified, uncomplicated: Secondary | ICD-10-CM | POA: Insufficient documentation

## 2011-12-27 MED ORDER — SALINE NASAL SPRAY 0.65 % NA SOLN: 1.0000 | NASAL | Status: DC | PRN

## 2011-12-27 MED ORDER — IBUPROFEN 400 MG PO TABS: 400.0000 mg | ORAL_TABLET | Freq: Four times a day (QID) | ORAL | Status: DC | PRN

## 2011-12-27 MED ORDER — AZITHROMYCIN 250 MG PO TABS: 250.0000 mg | ORAL_TABLET | Freq: Every day | ORAL | Status: DC

## 2011-12-27 NOTE — ED Notes (Signed)
Patient reports she has felt achy for 3 days,  She now has cough.  Patient states today she coughed up thick yellow mucous with blood.  Patient unsure if she has had a fever,  She has had hot/cold spells

## 2011-12-27 NOTE — ED Provider Notes (Addendum)
History    This chart was scribed for [name] Kaplan, MD, MD by [name] [name]. The patient was seen in room TR10C and the patient's care was started at 11:41AM. CSN: [id number]  Arrival date & time 12/27/11  4098    Chief Complaint  Patient presents with  . Generalized Body Aches  . URI    (Consider location/radiation/quality/duration/timing/severity/associated sxs/prior treatment) The history is provided by the patient. No language interpreter was used.   [name] [name] is a 51 y.o. female who presents to the Emergency Department complaining of constant, moderate nonproductive cough onset 4 days ago. Pt reports having nasal congestion with yellow mucous and blood. She reports that she has associated chest wall pain due to cough and generalized body aches. She states she has moderate headache and chills. Denies fever, nausea and vomiting. Pt has hx of RA, lupus and HTN. She is not currently taking medication for lupus due to insurance complications. Pt reports that she smokes 1 pack of cigarettes every 3-4 days. Denies any other pain.   Past Medical History  Diagnosis Date  . Arthritis   . Hypertension   . Lupus     Past Surgical History  Procedure Date  . Abdominal hysterectomy   . Breast biopsy     No family history on file.  History  Substance Use Topics  . Smoking status: Current Every Day Smoker -- 0.5 packs/day    Types: Cigarettes  . Smokeless tobacco: Not on file  . Alcohol Use: No    OB History    Grav Para Term Preterm Abortions TAB SAB Ect Mult Living                  Review of Systems  Constitutional: Positive for chills.  HENT: Positive for congestion and sinus pressure.   Respiratory: Positive for cough.   Neurological: Positive for headaches.  All other systems reviewed and are negative.    Allergies  Review of patient's allergies indicates no known allergies.  Home Medications   Current Outpatient Rx  Name Route Sig Dispense Refill  .  ACETAMINOPHEN ER 650 MG PO TBCR Oral Take 1,300 mg by mouth every 8 (eight) hours as needed. For pain    . HYDROCHLOROTHIAZIDE 25 MG PO TABS Oral Take 1 tablet (25 mg total) by mouth daily. 30 tablet 0  . SULFAMETHOXAZOLE-TRIMETHOPRIM 800-160 MG PO TABS Oral Take 2 tablets by mouth 2 (two) times daily. X 10 DAYS.      BP 157/87  Pulse 90  Temp 99.3 F (37.4 C) (Oral)  Resp 20  Ht 5\' 7"  (1.702 m)  Wt 210 lb (95.255 kg)  BMI 32.89 kg/m2  SpO2 92%  Physical Exam  Nursing note and vitals reviewed. Constitutional: She is oriented to person, place, and time. She appears well-developed and well-nourished. No distress.  HENT:  Head: Normocephalic and atraumatic.  Left Ear: External ear normal.  Mouth/Throat: No oropharyngeal exudate.       Right ear ext erythematous  No bulging on right  positive light reflex on right  Mild erythema of oropharynx   Cardiovascular: Normal rate, regular rhythm and normal heart sounds.   No murmur heard. Pulmonary/Chest: Effort normal and breath sounds normal. No respiratory distress. She has no wheezes. She has no rales.  Lymphadenopathy:    She has cervical adenopathy.  Neurological: She is alert and oriented to person, place, and time.  Skin: Skin is warm and dry.  Psychiatric: She has a normal mood and  affect. Her behavior is normal.    ED Course  Procedures (including critical care time) DIAGNOSTIC STUDIES: Oxygen Saturation is 92% on room air, normal by my interpretation.    COORDINATION OF CARE: 11:47 AM Discussed ED treatment with pt     Labs Reviewed - No data to display Dg Chest 2 View  12/27/2011  *RADIOLOGY REPORT*  Clinical Data: Chest tightness and shortness of breath.  CHEST - 2 VIEW  Comparison: Chest x-ray 07/28/2011.  Findings: Lung volumes are normal.  No consolidative airspace disease.  No pleural effusions.  No pneumothorax.  No pulmonary nodule or mass noted.  Pulmonary vasculature and the cardiomediastinal silhouette are  within normal limits.  Prominent epicardial fat pad again noted on the lateral projection.  IMPRESSION: 1. No radiographic evidence of acute cardiopulmonary disease.   Original Report Authenticated By: Florencia Reasons, M.D.      No diagnosis found.    MDM  Medical screening examination/treatment/procedure(s) were performed by me as the supervising physician. Scribe service was utilized for documentation only.  DDX includes: Viral syndrome Influenza Pharyngitis Sinusitis Pneumonia  Pt comes in with URI like sx, associated with some subjective chills, pleuritic type chest pain, and yellow-green discharge with the sinus drain. Suspect viral syndrome, vs bacterial sinusitis, or superimposed bacterial infection. No fevers. Pt will be sent home with AB for wait and watch approach.   [name] Kaplan, MD 12/27/11 1610  [name] Kaplan, MD 12/27/11 1218

## 2012-02-21 ENCOUNTER — Emergency Department (HOSPITAL_COMMUNITY)
Admission: EM | Admit: 2012-02-21 | Discharge: 2012-02-22 | Disposition: A | Discharge status: Home / Self Care | Payer: Medicare Other | Attending: Emergency Medicine | Admitting: Emergency Medicine

## 2012-02-21 ENCOUNTER — Emergency Department (HOSPITAL_COMMUNITY): Payer: Medicare Other

## 2012-02-21 ENCOUNTER — Encounter (HOSPITAL_COMMUNITY): Payer: Self-pay | Admitting: *Deleted

## 2012-02-21 DIAGNOSIS — N200 Calculus of kidney: Secondary | ICD-10-CM | POA: Insufficient documentation

## 2012-02-21 DIAGNOSIS — Z79899 Other long term (current) drug therapy: Secondary | ICD-10-CM | POA: Insufficient documentation

## 2012-02-21 DIAGNOSIS — R110 Nausea: Secondary | ICD-10-CM | POA: Insufficient documentation

## 2012-02-21 DIAGNOSIS — N83202 Unspecified ovarian cyst, left side: Secondary | ICD-10-CM

## 2012-02-21 DIAGNOSIS — R61 Generalized hyperhidrosis: Secondary | ICD-10-CM | POA: Insufficient documentation

## 2012-02-21 DIAGNOSIS — M329 Systemic lupus erythematosus, unspecified: Secondary | ICD-10-CM | POA: Insufficient documentation

## 2012-02-21 DIAGNOSIS — F17200 Nicotine dependence, unspecified, uncomplicated: Secondary | ICD-10-CM | POA: Insufficient documentation

## 2012-02-21 DIAGNOSIS — I10 Essential (primary) hypertension: Secondary | ICD-10-CM | POA: Insufficient documentation

## 2012-02-21 DIAGNOSIS — Z8739 Personal history of other diseases of the musculoskeletal system and connective tissue: Secondary | ICD-10-CM | POA: Insufficient documentation

## 2012-02-21 DIAGNOSIS — N83209 Unspecified ovarian cyst, unspecified side: Secondary | ICD-10-CM | POA: Insufficient documentation

## 2012-02-21 LAB — CBC WITH DIFFERENTIAL/PLATELET
Basophils Absolute: 0.1 10*3/uL (ref 0.0–0.1)
Basophils Relative: 1 % (ref 0–1)
Eosinophils Absolute: 0.3 10*3/uL (ref 0.0–0.7)
Eosinophils Relative: 3 % (ref 0–5)
HCT: 44.7 % (ref 36.0–46.0)
Hemoglobin: 15.4 g/dL — ABNORMAL HIGH (ref 12.0–15.0)
Lymphocytes Relative: 37 % (ref 12–46)
Lymphs Abs: 4.1 10*3/uL — ABNORMAL HIGH (ref 0.7–4.0)
MCH: 30.7 pg (ref 26.0–34.0)
MCHC: 34.5 g/dL (ref 30.0–36.0)
MCV: 89 fL (ref 78.0–100.0)
Monocytes Absolute: 0.8 10*3/uL (ref 0.1–1.0)
Monocytes Relative: 7 % (ref 3–12)
Neutro Abs: 5.8 10*3/uL (ref 1.7–7.7)
Neutrophils Relative %: 53 % (ref 43–77)
Platelets: 282 10*3/uL (ref 150–400)
RBC: 5.02 MIL/uL (ref 3.87–5.11)
RDW: 14.8 % (ref 11.5–15.5)
WBC: 11.1 10*3/uL — ABNORMAL HIGH (ref 4.0–10.5)

## 2012-02-21 LAB — URINALYSIS, ROUTINE W REFLEX MICROSCOPIC
Bilirubin Urine: NEGATIVE
Glucose, UA: NEGATIVE mg/dL
Ketones, ur: NEGATIVE mg/dL
Leukocytes, UA: NEGATIVE
Nitrite: NEGATIVE
Protein, ur: NEGATIVE mg/dL
Specific Gravity, Urine: 1.018 (ref 1.005–1.030)
Urobilinogen, UA: 1 mg/dL (ref 0.0–1.0)
pH: 7.5 (ref 5.0–8.0)

## 2012-02-21 LAB — COMPREHENSIVE METABOLIC PANEL
ALT: 18 U/L (ref 0–35)
AST: 21 U/L (ref 0–37)
Albumin: 3.6 g/dL (ref 3.5–5.2)
Alkaline Phosphatase: 83 U/L (ref 39–117)
BUN: 13 mg/dL (ref 6–23)
CO2: 23 mEq/L (ref 19–32)
Calcium: 9.4 mg/dL (ref 8.4–10.5)
Chloride: 106 mEq/L (ref 96–112)
Creatinine, Ser: 0.95 mg/dL (ref 0.50–1.10)
GFR calc Af Amer: 78 mL/min — ABNORMAL LOW (ref >90)
GFR calc non Af Amer: 67 mL/min — ABNORMAL LOW (ref >90)
Glucose, Bld: 117 mg/dL — ABNORMAL HIGH (ref 70–99)
Potassium: 4.1 mEq/L (ref 3.5–5.1)
Sodium: 141 mEq/L (ref 135–145)
Total Bilirubin: 0.3 mg/dL (ref 0.3–1.2)
Total Protein: 7.9 g/dL (ref 6.0–8.3)

## 2012-02-21 LAB — URINE MICROSCOPIC-ADD ON

## 2012-02-21 LAB — LIPASE, BLOOD: Lipase: 32 U/L (ref 11–59)

## 2012-02-21 MED ORDER — ONDANSETRON HCL 4 MG/2ML IJ SOLN
4.0000 mg | Freq: Once | INTRAMUSCULAR | Status: AC
  Administered 2012-02-21: 4 mg via INTRAVENOUS
  Filled 2012-02-21: qty 2

## 2012-02-21 MED ORDER — SODIUM CHLORIDE 0.9 % IV SOLN
1000.0000 mL | Freq: Once | INTRAVENOUS | Status: AC
  Administered 2012-02-21: 1000 mL via INTRAVENOUS

## 2012-02-21 MED ORDER — HYDROMORPHONE HCL PF 1 MG/ML IJ SOLN
1.0000 mg | Freq: Once | INTRAMUSCULAR | Status: AC
  Administered 2012-02-21: 1 mg via INTRAVENOUS
  Filled 2012-02-21: qty 1

## 2012-02-21 MED ORDER — FENTANYL CITRATE 0.05 MG/ML IJ SOLN
100.0000 ug | Freq: Once | INTRAMUSCULAR | Status: AC
  Administered 2012-02-21: 100 ug via INTRAVENOUS
  Filled 2012-02-21: qty 2

## 2012-02-21 MED ORDER — KETOROLAC TROMETHAMINE 30 MG/ML IJ SOLN
30.0000 mg | Freq: Once | INTRAMUSCULAR | Status: AC
  Administered 2012-02-21: 30 mg via INTRAVENOUS
  Filled 2012-02-21: qty 1

## 2012-02-21 NOTE — ED Provider Notes (Signed)
History     CSN: [id number]  Arrival date & time 02/21/12  2153   First MD Initiated Contact with Patient 02/21/12 2307      Chief Complaint  Patient presents with  . Abdominal Pain    (Consider location/radiation/quality/duration/timing/severity/associated sxs/prior treatment) HPI Comments: 50 year old female who presents with a complaint of left lower quadrant pain associated with left flank pain and left hip pain which radiates to her left groin. This awoke her from sleep approximately 2 hours ago, has been relatively constant, is poorly described but is associated with nausea and diaphoresis. She vaguely remembers having a several years ago, she was evaluated by a nurse and thought to have had a kidney stone but did not register as an ER patient at that time. She denies any history of other abdominal symptoms though she does have lupus, rheumatoid arthritis and hypertension. She had no medication prior to arrival. There is no pain on the right side of the abdomen, no fevers or chills, no shortness of breath, cough, back pain, chest pain, swelling of the legs, rashes. She does endorse mild loose stools today.  [name] presenting with abdominal pain. The history is provided by the patient.  Abdominal Pain The primary symptoms of the illness include abdominal pain.    Past Medical History  Diagnosis Date  . Arthritis   . Hypertension   . Lupus     Past Surgical History  Procedure Date  . Abdominal hysterectomy   . Breast biopsy     No family history on file.  History  Substance Use Topics  . Smoking status: Current Every Day Smoker -- 0.5 packs/day    Types: Cigarettes  . Smokeless tobacco: Not on file  . Alcohol Use: No    OB History    Grav Para Term Preterm Abortions TAB SAB Ect Mult Living                  Review of Systems  Gastrointestinal: Positive for abdominal pain.  All other systems reviewed and are negative.    Allergies   Review of patient's allergies indicates no known allergies.  Home Medications   Current Outpatient Rx  Name  Route  Sig  Dispense  Refill  . ACETAMINOPHEN ER 650 MG PO TBCR   Oral   Take 1,300 mg by mouth every 8 (eight) hours as needed. For pain         . HYDROCODONE-ACETAMINOPHEN 5-500 MG PO TABS   Oral   Take 1-2 tablets by mouth every 6 (six) hours as needed for pain.   15 tablet   0   . NAPROXEN 500 MG PO TABS   Oral   Take 1 tablet (500 mg total) by mouth 2 (two) times daily with a meal.   30 tablet   0   . PROMETHAZINE HCL 25 MG PO TABS   Oral   Take 1 tablet (25 mg total) by mouth every 6 (six) hours as needed for nausea.   12 tablet   0   . TAMSULOSIN HCL 0.4 MG PO CAPS   Oral   Take 1 capsule (0.4 mg total) by mouth 2 (two) times daily.   10 capsule   0     BP 170/96  Pulse 88  Temp 97.8 F (36.6 C) (Oral)  Resp 16  SpO2 94%  Physical Exam  Nursing note and vitals reviewed. Constitutional: She appears well-developed and well-nourished. No distress.  HENT:  Head: Normocephalic and atraumatic.  Mouth/Throat: Oropharynx is clear and moist. No oropharyngeal exudate.  Eyes: Conjunctivae normal and EOM are normal. Pupils are equal, round, and reactive to light. Right eye exhibits no discharge. Left eye exhibits no discharge. No scleral icterus.  Neck: Normal range of motion. Neck supple. No JVD present. No thyromegaly present.  Cardiovascular: Normal rate, regular rhythm, normal heart sounds and intact distal pulses.  Exam reveals no gallop and no friction rub.   No murmur heard. Pulmonary/Chest: Effort normal and breath sounds normal. No respiratory distress. She has no wheezes. She has no rales.  Abdominal: Soft. Bowel sounds are normal. She exhibits no distension and no mass. There is no tenderness.       No CVA tenderness  Musculoskeletal: Normal range of motion. She exhibits no edema and no tenderness.  Lymphadenopathy:    She has no cervical  adenopathy.  Neurological: She is alert. Coordination normal.  Skin: Skin is warm and dry. No rash noted. No erythema.  Psychiatric: She has a normal mood and affect. Her behavior is normal.    ED Course  Procedures (including critical care time)  Labs Reviewed  CBC WITH DIFFERENTIAL - Abnormal; Notable for the following:    WBC 11.1 (*)     Hemoglobin 15.4 (*)     Lymphs Abs 4.1 (*)     All other components within normal limits  COMPREHENSIVE METABOLIC PANEL - Abnormal; Notable for the following:    Glucose, Bld 117 (*)     GFR calc non Af Amer 67 (*)     GFR calc Af Amer 78 (*)     All other components within normal limits  URINALYSIS, ROUTINE W REFLEX MICROSCOPIC - Abnormal; Notable for the following:    Hgb urine dipstick MODERATE (*)     All other components within normal limits  LIPASE, BLOOD  URINE MICROSCOPIC-ADD ON   Ct Abdomen Pelvis Wo Contrast  02/22/2012  *RADIOLOGY REPORT*  Clinical Data: Left lower back and lower abdominal pain.  Possible nephrolithiasis in the past.  CT ABDOMEN AND PELVIS WITHOUT CONTRAST  Technique:  Multidetector CT imaging of the abdomen and pelvis was performed following the standard protocol without intravenous contrast.  Comparison: Radiographs obtained earlier today.  Findings: Adjacent 10 mm and 3 mm calculi in the lower pole of the left kidney.  Mild to moderate dilatation of the left renal collecting system and ureter to the level of a 6 mm oval calcification in the mid pelvis.  On the sagittal reconstruction image number 68, this appears to be within the ureter at that location.  The ureter distal to that location is normal in caliber. This calcification is visible on the scout image, overlying the inferior aspect of the sacrum.  No bladder, right ureteral or right renal calculi are seen.  Unremarkable noncontrasted appearance of the liver, spleen, gallbladder, pancreas and adrenal glands.  Surgically absent uterus.  Normal appearing right ovary.   Adjacent 5.0 and 1.4 cm left ovarian cysts.  Multiple colonic diverticula without evidence of diverticulitis. Normal appearing appendix in the right mid to lower pelvis.  Mild atheromatous arterial calcifications without aneurysm.  Mild dependent atelectasis at both lung bases.  Lumbar and lower thoracic spine degenerative changes.  IMPRESSION:  1.  6 mm distal left ureteral calculus, causing mild to moderate left hydronephrosis. 2.  Adjacent nonobstructing lower pole left renal calculi. 3.  5.0 and 1.4 cm left ovarian cysts.  These could be better characterized with a  follow-up elective pelvic ultrasound. 4.  Colonic diverticulosis.   Original Report Authenticated By: Beckie Salts, M.D.    Dg Abd Acute W/chest  02/21/2012  *RADIOLOGY REPORT*  Clinical Data: Left lower quadrant pain.  ACUTE ABDOMEN SERIES (ABDOMEN 2 VIEW & CHEST 1 VIEW)  Comparison: Chest x-ray 12/27/2011  Findings: Heart is borderline enlarged.  No confluent airspace opacities or effusions in the lungs.  Nonobstructive bowel gas pattern.  No free air.  Left lower pole renal stones noted, the largest measuring 9 mm.  No suspicious calcifications visualized over the expected course of the ureters. No organomegaly.  No acute bony abnormality.  IMPRESSION: Left lower pole nephrolithiasis.  No evidence of bowel obstruction or free air.  Borderline heart size.   Original Report Authenticated By: Charlett Nose, M.D.      1. Kidney stone on left side   2. Ovarian cyst, left       MDM  The patient is moving a bit gingerly but overall has no tenderness on her exam, I suspect this is likely ureterolithiasis as she does have mild hematuria. She does not appear in acute distress, is giving a complete history and has her memory intact and has no signs of cardiovascular compromise. She does not have a fever, no tachycardia, oxygen levels on my exam are 98%. We'll obtain a CT scan of the abdomen and pelvis to further evaluate her left-sided lower  quadrant pain.  Laboratory results show a mild leukocytosis of 11,000, normal electrolytes, renal function and liver function and a normal lipase. The acute abdominal series was ordered to show nephrolithiasis on the left and no obstructions or free air. There was no signs of pulmonary infiltrates on the x-ray  Toradol, Dilaudid, Zofran.   After medications the patient is much improved. She has a CT scan that shows a distal left ureteral stone with mild hydronephrosis, she has no pain or nausea at this time and has agreed to followup as needed, pain medications given to the patient for home as well as Flomax, she appears stable at this time, she has been made aware of her ovarian cysts and will followup with the OB/GYN.     Vida Roller, MD 02/22/12 [phone]

## 2012-02-21 NOTE — ED Notes (Signed)
Patient transported to X-ray 

## 2012-02-21 NOTE — ED Notes (Signed)
Pt reports left side hip pain, lower abdominal pain, and lower back pain. Pt states that she had similar pain a couple of yrs ago and was told she may have past a kidney stone at that time. Pt reports nausea, and diarrhea with the abdominal pain.Rates pain 10/10 at this time.

## 2012-02-22 MED ORDER — PROMETHAZINE HCL 25 MG PO TABS: 25.0000 mg | ORAL_TABLET | Freq: Four times a day (QID) | ORAL | Status: DC | PRN

## 2012-02-22 MED ORDER — HYDROMORPHONE HCL PF 1 MG/ML IJ SOLN
1.0000 mg | Freq: Once | INTRAMUSCULAR | Status: AC
  Administered 2012-02-22: 1 mg via INTRAVENOUS
  Filled 2012-02-22: qty 1

## 2012-02-22 MED ORDER — TAMSULOSIN HCL 0.4 MG PO CAPS: 0.4000 mg | ORAL_CAPSULE | Freq: Two times a day (BID) | ORAL | Status: DC

## 2012-02-22 MED ORDER — NAPROXEN 500 MG PO TABS: 500.0000 mg | ORAL_TABLET | Freq: Two times a day (BID) | ORAL | Status: DC

## 2012-02-22 MED ORDER — HYDROCODONE-ACETAMINOPHEN 5-500 MG PO TABS: 1.0000 | ORAL_TABLET | Freq: Four times a day (QID) | ORAL | Status: DC | PRN

## 2012-02-22 NOTE — ED Notes (Signed)
Family at bedside. 

## 2012-04-10 DIAGNOSIS — I10 Essential (primary) hypertension: Secondary | ICD-10-CM | POA: Insufficient documentation

## 2012-04-10 DIAGNOSIS — J111 Influenza due to unidentified influenza virus with other respiratory manifestations: Secondary | ICD-10-CM | POA: Insufficient documentation

## 2012-04-10 DIAGNOSIS — J159 Unspecified bacterial pneumonia: Secondary | ICD-10-CM | POA: Insufficient documentation

## 2012-04-10 DIAGNOSIS — F17200 Nicotine dependence, unspecified, uncomplicated: Secondary | ICD-10-CM | POA: Insufficient documentation

## 2012-04-10 DIAGNOSIS — Z8739 Personal history of other diseases of the musculoskeletal system and connective tissue: Secondary | ICD-10-CM | POA: Insufficient documentation

## 2012-04-10 DIAGNOSIS — Z79899 Other long term (current) drug therapy: Secondary | ICD-10-CM | POA: Insufficient documentation

## 2012-04-11 ENCOUNTER — Emergency Department (HOSPITAL_COMMUNITY): Payer: Medicare Other

## 2012-04-11 ENCOUNTER — Encounter (HOSPITAL_COMMUNITY): Payer: Self-pay | Admitting: Emergency Medicine

## 2012-04-11 ENCOUNTER — Emergency Department (HOSPITAL_COMMUNITY)
Admission: EM | Admit: 2012-04-11 | Discharge: 2012-04-11 | Disposition: A | Discharge status: Home / Self Care | Payer: Medicare Other | Attending: Emergency Medicine | Admitting: Emergency Medicine

## 2012-04-11 DIAGNOSIS — J111 Influenza due to unidentified influenza virus with other respiratory manifestations: Secondary | ICD-10-CM

## 2012-04-11 DIAGNOSIS — J189 Pneumonia, unspecified organism: Secondary | ICD-10-CM

## 2012-04-11 LAB — CBC WITH DIFFERENTIAL/PLATELET
Basophils Absolute: 0 10*3/uL (ref 0.0–0.1)
Basophils Relative: 1 % (ref 0–1)
Eosinophils Absolute: 0 10*3/uL (ref 0.0–0.7)
Eosinophils Relative: 0 % (ref 0–5)
HCT: 47.5 % — ABNORMAL HIGH (ref 36.0–46.0)
Hemoglobin: 15.9 g/dL — ABNORMAL HIGH (ref 12.0–15.0)
Lymphocytes Relative: 29 % (ref 12–46)
Lymphs Abs: 2.5 10*3/uL (ref 0.7–4.0)
MCH: 29.3 pg (ref 26.0–34.0)
MCHC: 33.5 g/dL (ref 30.0–36.0)
MCV: 87.6 fL (ref 78.0–100.0)
Monocytes Absolute: 0.9 10*3/uL (ref 0.1–1.0)
Monocytes Relative: 11 % (ref 3–12)
Neutro Abs: 5.1 10*3/uL (ref 1.7–7.7)
Neutrophils Relative %: 59 % (ref 43–77)
Platelets: 270 10*3/uL (ref 150–400)
RBC: 5.42 MIL/uL — ABNORMAL HIGH (ref 3.87–5.11)
RDW: 15.3 % (ref 11.5–15.5)
WBC: 8.6 10*3/uL (ref 4.0–10.5)

## 2012-04-11 LAB — COMPREHENSIVE METABOLIC PANEL
ALT: 16 U/L (ref 0–35)
AST: 20 U/L (ref 0–37)
Albumin: 3.6 g/dL (ref 3.5–5.2)
Alkaline Phosphatase: 96 U/L (ref 39–117)
BUN: 11 mg/dL (ref 6–23)
CO2: 25 mEq/L (ref 19–32)
Calcium: 9.7 mg/dL (ref 8.4–10.5)
Chloride: 100 mEq/L (ref 96–112)
Creatinine, Ser: 0.82 mg/dL (ref 0.50–1.10)
GFR calc Af Amer: >90 mL/min (ref >90)
GFR calc non Af Amer: 80 mL/min — ABNORMAL LOW (ref >90)
Glucose, Bld: 134 mg/dL — ABNORMAL HIGH (ref 70–99)
Potassium: 3.8 mEq/L (ref 3.5–5.1)
Sodium: 138 mEq/L (ref 135–145)
Total Bilirubin: 0.5 mg/dL (ref 0.3–1.2)
Total Protein: 8.3 g/dL (ref 6.0–8.3)

## 2012-04-11 LAB — POCT I-STAT TROPONIN I: Troponin i, poc: 0.02 ng/mL (ref 0.00–0.08)

## 2012-04-11 MED ORDER — CLARITHROMYCIN 500 MG PO TABS: 500.0000 mg | ORAL_TABLET | Freq: Two times a day (BID) | ORAL | Status: DC

## 2012-04-11 MED ORDER — HYDROCOD POLST-CHLORPHEN POLST 10-8 MG/5ML PO LQCR: 5.0000 mL | Freq: Two times a day (BID) | ORAL | Status: DC | PRN

## 2012-04-11 MED ORDER — ACETAMINOPHEN 325 MG PO TABS
650.0000 mg | ORAL_TABLET | Freq: Once | ORAL | Status: AC
  Administered 2012-04-11: 650 mg via ORAL
  Filled 2012-04-11: qty 2

## 2012-04-11 MED ORDER — ALBUTEROL SULFATE HFA 108 (90 BASE) MCG/ACT IN AERS
2.0000 | INHALATION_SPRAY | RESPIRATORY_TRACT | Status: DC | PRN
  Administered 2012-04-11: 2 via RESPIRATORY_TRACT
  Filled 2012-04-11: qty 6.7

## 2012-04-11 NOTE — ED Notes (Signed)
Rx x 2.  Pt voiced understanding to f/u with PCP and return for worsening condition.  

## 2012-04-11 NOTE — ED Notes (Addendum)
Patient complaining of multiple symptoms -- productive cough with yellow sputum production, headache, bilateral ear pain and ringing, fatigue, loss of appetite, chills, generalized body aches, right hip pain, abdominal pain, nausea, and chest pain, and shortness of breath.

## 2012-04-11 NOTE — ED Provider Notes (Signed)
History     CSN: [id number]  Arrival date & time 04/10/12  2356   First MD Initiated Contact with Patient 04/11/12 0120      Chief Complaint  Patient presents with  . Flu-Like Symptoms     (Consider location/radiation/quality/duration/timing/severity/associated sxs/prior treatment) HPI Is a 52 year old female with a five-day history of generalized body aches, fever, cough productive of yellow sputum, nasal congestion, pressure in the ears, mild shortness of breath and nausea. She denies vomiting or diarrhea. She has taken over-the-counter medications without relief. She states her chest and abdomen are sore from coughing. Her symptoms are moderate to severe, but she waited until now because she does not like to have to visit a doctor. She believes she caught this illness from a family member.  Past Medical History  Diagnosis Date  . Arthritis   . Hypertension   . Lupus     Past Surgical History  Procedure Date  . Abdominal hysterectomy   . Breast biopsy     History reviewed. No pertinent family history.  History  Substance Use Topics  . Smoking status: Current Every Day Smoker -- 0.5 packs/day    Types: Cigarettes  . Smokeless tobacco: Not on file  . Alcohol Use: No    OB History    Grav Para Term Preterm Abortions TAB SAB Ect Mult Living                  Review of Systems  All other systems reviewed and are negative.    Allergies  Review of patient's allergies indicates no known allergies.  Home Medications   Current Outpatient Rx  Name  Route  Sig  Dispense  Refill  . ACETAMINOPHEN ER 650 MG PO TBCR   Oral   Take 1,300 mg by mouth every 8 (eight) hours as needed. For pain         . HYDROCODONE-ACETAMINOPHEN 5-500 MG PO TABS   Oral   Take 1-2 tablets by mouth every 6 (six) hours as needed for pain.   15 tablet   0   . NAPROXEN 500 MG PO TABS   Oral   Take 1 tablet (500 mg total) by mouth 2 (two) times daily with a meal.   30 tablet   0   .  PROMETHAZINE HCL 25 MG PO TABS   Oral   Take 1 tablet (25 mg total) by mouth every 6 (six) hours as needed for nausea.   12 tablet   0   . TAMSULOSIN HCL 0.4 MG PO CAPS   Oral   Take 1 capsule (0.4 mg total) by mouth 2 (two) times daily.   10 capsule   0     BP 171/89  Pulse 94  Temp 99.4 F (37.4 C) (Oral)  Resp 22  SpO2 92%  Physical Exam General: Well-developed, well-nourished female in no acute distress; appearance consistent with age of record HENT: normocephalic, atraumatic; no pharyngeal erythema or exudate; no erythema of TMs; nasal congestion Eyes: pupils equal round and reactive to light; extraocular muscles intact Neck: supple Heart: regular rate and rhythm Lungs: clear to auscultation bilaterally but deep breathing stimulates paroxysms of cough Abdomen: soft; nondistended; mild diffuse tenderness; no masses or hepatosplenomegaly; bowel sounds present Extremities: No deformity; full range of motion Neurologic: Awake, alert and oriented; motor function intact in all extremities and symmetric; no facial droop Skin: Warm and dry Psychiatric: Normal mood and affect    ED Course  Procedures (including critical care  time)     MDM   Nursing notes and vitals signs, including pulse oximetry, reviewed.  Summary of this visit's results, reviewed by myself:  Labs:  Results for orders placed during the hospital encounter of 04/11/12 (from the past 24 hour(s))  CBC WITH DIFFERENTIAL     Status: Abnormal   Collection Time   04/11/12 12:12 AM      Component Value Range   WBC 8.6  4.0 - 10.5 K/uL   RBC 5.42 (*) 3.87 - 5.11 MIL/uL   Hemoglobin 15.9 (*) 12.0 - 15.0 g/dL   HCT 16.1 (*) 09.6 - 04.5 %   MCV 87.6  78.0 - 100.0 fL   MCH 29.3  26.0 - 34.0 pg   MCHC 33.5  30.0 - 36.0 g/dL   RDW 40.9  81.1 - 91.4 %   Platelets 270  150 - 400 K/uL   Neutrophils Relative 59  43 - 77 %   Neutro Abs 5.1  1.7 - 7.7 K/uL   Lymphocytes Relative 29  12 - 46 %   Lymphs Abs 2.5   0.7 - 4.0 K/uL   Monocytes Relative 11  3 - 12 %   Monocytes Absolute 0.9  0.1 - 1.0 K/uL   Eosinophils Relative 0  0 - 5 %   Eosinophils Absolute 0.0  0.0 - 0.7 K/uL   Basophils Relative 1  0 - 1 %   Basophils Absolute 0.0  0.0 - 0.1 K/uL  COMPREHENSIVE METABOLIC PANEL     Status: Abnormal   Collection Time   04/11/12 12:12 AM      Component Value Range   Sodium 138  135 - 145 mEq/L   Potassium 3.8  3.5 - 5.1 mEq/L   Chloride 100  96 - 112 mEq/L   CO2 25  19 - 32 mEq/L   Glucose, Bld 134 (*) 70 - 99 mg/dL   BUN 11  6 - 23 mg/dL   Creatinine, Ser 7.82  0.50 - 1.10 mg/dL   Calcium 9.7  8.4 - 95.6 mg/dL   Total Protein 8.3  6.0 - 8.3 g/dL   Albumin 3.6  3.5 - 5.2 g/dL   AST 20  0 - 37 U/L   ALT 16  0 - 35 U/L   Alkaline Phosphatase 96  39 - 117 U/L   Total Bilirubin 0.5  0.3 - 1.2 mg/dL   GFR calc non Af Amer 80 (*) >90 mL/min   GFR calc Af Amer >90  >90 mL/min  POCT I-STAT TROPONIN I     Status: Normal   Collection Time   04/11/12 12:25 AM      Component Value Range   Troponin i, poc 0.02  0.00 - 0.08 ng/mL   Comment 3             Imaging Studies: Dg Chest 2 View  04/11/2012  *RADIOLOGY REPORT*  Clinical Data: Shortness of breath and chest pain; worsening cough and fever.  CHEST - 2 VIEW  Comparison: Chest radiograph performed 02/21/2012  Findings: The lungs are well-aerated.  Mild right basilar airspace opacity raises concern for mild right lower lobe pneumonia.  There is no evidence of pleural effusion or pneumothorax.  The heart is normal in size; the mediastinal contour is within normal limits.  No acute osseous abnormalities are seen.  IMPRESSION: Mild right lower lobe pneumonia noted.   Original Report Authenticated By: Tonia Ghent, M.D.  Hanley Seamen, MD 04/11/12 [phone]

## 2012-05-01 ENCOUNTER — Other Ambulatory Visit: Payer: Self-pay

## 2012-05-15 DIAGNOSIS — I639 Cerebral infarction, unspecified: Secondary | ICD-10-CM

## 2012-05-15 HISTORY — DX: Cerebral infarction, unspecified: I63.9

## 2012-05-27 ENCOUNTER — Emergency Department (INDEPENDENT_AMBULATORY_CARE_PROVIDER_SITE_OTHER)
Admission: EM | Admit: 2012-05-27 | Discharge: 2012-05-27 | Disposition: A | Discharge status: Home / Self Care | Payer: Medicare Other | Source: Home / Self Care | Attending: Emergency Medicine | Admitting: Emergency Medicine

## 2012-05-27 ENCOUNTER — Encounter (HOSPITAL_COMMUNITY): Payer: Self-pay | Admitting: *Deleted

## 2012-05-27 DIAGNOSIS — IMO0002 Reserved for concepts with insufficient information to code with codable children: Secondary | ICD-10-CM

## 2012-05-27 DIAGNOSIS — L02411 Cutaneous abscess of right axilla: Secondary | ICD-10-CM

## 2012-05-27 MED ORDER — SULFAMETHOXAZOLE-TRIMETHOPRIM 800-160 MG PO TABS: 1.0000 | ORAL_TABLET | Freq: Two times a day (BID) | ORAL | Status: DC

## 2012-05-27 NOTE — ED Notes (Signed)
Pt   Reports    An  abcess  Under   r  Armpit       X  1  Week              The  Area  Is tender  To  The  Touch               She  Has    abcess  In  Past

## 2012-05-27 NOTE — ED Provider Notes (Signed)
History     CSN: [id number]  Arrival date & time 05/27/12  1022   First MD Initiated Contact with Patient 05/27/12 1042      Chief Complaint  Patient presents with  . Abscess    (Consider location/radiation/quality/duration/timing/severity/associated sxs/prior treatment) HPI Comments: 52 year old female complaining of an abscess in the right axilla. It began approximately one week ago she has a long history of recurrent abscesses in the legs and groin areas. She has had them incised and drained at the emergency department. She denies fever or other constitutional symptoms.   Past Medical History  Diagnosis Date  . Arthritis   . Hypertension   . Lupus     Past Surgical History  Procedure Laterality Date  . Abdominal hysterectomy    . Breast biopsy      History reviewed. No pertinent family history.  History  Substance Use Topics  . Smoking status: Current Every Day Smoker -- 0.50 packs/day    Types: Cigarettes  . Smokeless tobacco: Not on file  . Alcohol Use: No    OB History   Grav Para Term Preterm Abortions TAB SAB Ect Mult Living                  Review of Systems  Constitutional: Negative.   Gastrointestinal: Negative.   Skin:       As per history of present illness  Neurological: Negative.     Allergies  Review of patient's allergies indicates no known allergies.  Home Medications   Current Outpatient Rx  Name  Route  Sig  Dispense  Refill  . acetaminophen (TYLENOL ARTHRITIS PAIN) 650 MG CR tablet   Oral   Take 1,300 mg by mouth every 8 (eight) hours as needed. For pain         . chlorpheniramine-HYDROcodone (TUSSIONEX PENNKINETIC ER) 10-8 MG/5ML LQCR   Oral   Take 5 mLs by mouth every 12 (twelve) hours as needed (for cough or body aches).   115 mL   0   . clarithromycin (BIAXIN) 500 MG tablet   Oral   Take 1 tablet (500 mg total) by mouth 2 (two) times daily.   14 tablet   0   . HYDROcodone-acetaminophen (VICODIN) 5-500 MG per  tablet   Oral   Take 1-2 tablets by mouth every 6 (six) hours as needed for pain.   15 tablet   0   . naproxen (NAPROSYN) 500 MG tablet   Oral   Take 1 tablet (500 mg total) by mouth 2 (two) times daily with a meal.   30 tablet   0   . promethazine (PHENERGAN) 25 MG tablet   Oral   Take 1 tablet (25 mg total) by mouth every 6 (six) hours as needed for nausea.   12 tablet   0   . sulfamethoxazole-trimethoprim (SEPTRA DS) 800-160 MG per tablet   Oral   Take 1 tablet by mouth 2 (two) times daily. X 5 days   10 tablet   0   . Tamsulosin HCl (FLOMAX) 0.4 MG CAPS   Oral   Take 1 capsule (0.4 mg total) by mouth 2 (two) times daily.   10 capsule   0     BP 172/92  Pulse 81  Temp(Src) 97.7 F (36.5 C) (Oral)  Resp 16  SpO2 100%  Physical Exam  Nursing note and vitals reviewed. Constitutional: She is oriented to person, place, and time. She appears well-developed and well-nourished. No distress.  Neck: Normal range of motion. Neck supple.  Pulmonary/Chest: Effort normal.  Neurological: She is alert and oriented to person, place, and time.  Skin: Skin is warm and dry.  The right axillary lesion is primarily annular in raise. It measures 3 cm across. Exquisitely tender. It is erythematous with mild erythema extending into the surrounding healthy scan by 2 and half centimeters. There is a small pustule on top of the area of swelling and fluctuance.   And he and a will a ED Course  INCISION AND DRAINAGE Date/Time: 05/27/2012 11:38 AM Performed by: Phineas Real, Ardith Test Authorized by: Leslee Home C Consent: Verbal consent obtained. Risks and benefits: risks, benefits and alternatives were discussed Consent given by: patient Patient identity confirmed: verbally with patient Type: abscess Body area: upper extremity Location details: right arm Anesthesia: local infiltration Local anesthetic: lidocaine 2% with epinephrine Anesthetic total: 3 ml Patient sedated: no Scalpel size:  11 Incision type: single straight Complexity: simple Drainage: purulent Drainage amount: copious Wound treatment: drain placed Packing material: 1/4 in gauze   (including critical care time)  Labs Reviewed - No data to display No results found.   1. Abscess of axilla, right       MDM  The abscess was incised and drained. The packing was approximately 1/2-1 inch just to keep it open. The patient is instructed to apply warm compresses and she may remove the packing in 2 days. Is not require that she followup unless she is not getting better or if this gets worse. She has had several of these before and is familiar with the care and progression of disease. She is instructed on hand washing, cleaning of the skin, cleaning the areas which are prone to abscesses with alcohol every day. Culture obtained        Hayden Rasmussen, NP 05/27/12 1139  Hayden Rasmussen, NP 05/27/12 1147

## 2012-05-28 NOTE — ED Provider Notes (Signed)
Medical screening examination/treatment/procedure(s) were performed by non-physician practitioner and as supervising physician I was immediately available for consultation/collaboration.  David Keller, M.D.  David C Keller, MD 05/28/12 2334 

## 2012-05-30 LAB — CULTURE, ROUTINE-ABSCESS

## 2012-05-31 NOTE — ED Notes (Signed)
Abscess culture: Staph. Species (coagulase neg.)  Pt. treated with Septra DS.  Not on sensitivity.  Dr. Lorenz Coaster said it is a contaminant but pt. Should finish antibiotic since she did have an abscess. Vassie Moselle 05/31/2012

## 2012-06-03 ENCOUNTER — Emergency Department (HOSPITAL_COMMUNITY): Payer: Medicare Other

## 2012-06-03 ENCOUNTER — Inpatient Hospital Stay (HOSPITAL_COMMUNITY)
Admission: EM | Admit: 2012-06-03 | Discharge: 2012-06-05 | DRG: 066 | Disposition: A | Discharge status: Home / Self Care | Payer: Medicare Other | Attending: Internal Medicine | Admitting: Internal Medicine

## 2012-06-03 ENCOUNTER — Inpatient Hospital Stay (HOSPITAL_COMMUNITY): Payer: Medicare Other

## 2012-06-03 ENCOUNTER — Encounter (HOSPITAL_COMMUNITY): Payer: Self-pay

## 2012-06-03 DIAGNOSIS — I639 Cerebral infarction, unspecified: Secondary | ICD-10-CM

## 2012-06-03 DIAGNOSIS — Z833 Family history of diabetes mellitus: Secondary | ICD-10-CM

## 2012-06-03 DIAGNOSIS — Z8673 Personal history of transient ischemic attack (TIA), and cerebral infarction without residual deficits: Secondary | ICD-10-CM | POA: Insufficient documentation

## 2012-06-03 DIAGNOSIS — Z794 Long term (current) use of insulin: Secondary | ICD-10-CM | POA: Diagnosis present

## 2012-06-03 DIAGNOSIS — F17200 Nicotine dependence, unspecified, uncomplicated: Secondary | ICD-10-CM | POA: Diagnosis present

## 2012-06-03 DIAGNOSIS — E66811 Obesity, class 1: Secondary | ICD-10-CM | POA: Diagnosis present

## 2012-06-03 DIAGNOSIS — E1149 Type 2 diabetes mellitus with other diabetic neurological complication: Secondary | ICD-10-CM | POA: Diagnosis present

## 2012-06-03 DIAGNOSIS — I6350 Cerebral infarction due to unspecified occlusion or stenosis of unspecified cerebral artery: Principal | ICD-10-CM | POA: Diagnosis present

## 2012-06-03 DIAGNOSIS — IMO0002 Reserved for concepts with insufficient information to code with codable children: Secondary | ICD-10-CM

## 2012-06-03 DIAGNOSIS — E1165 Type 2 diabetes mellitus with hyperglycemia: Secondary | ICD-10-CM | POA: Diagnosis present

## 2012-06-03 DIAGNOSIS — Z823 Family history of stroke: Secondary | ICD-10-CM

## 2012-06-03 DIAGNOSIS — Z6834 Body mass index (BMI) 34.0-34.9, adult: Secondary | ICD-10-CM

## 2012-06-03 DIAGNOSIS — M329 Systemic lupus erythematosus, unspecified: Secondary | ICD-10-CM | POA: Diagnosis present

## 2012-06-03 DIAGNOSIS — E669 Obesity, unspecified: Secondary | ICD-10-CM | POA: Diagnosis present

## 2012-06-03 DIAGNOSIS — E119 Type 2 diabetes mellitus without complications: Secondary | ICD-10-CM | POA: Diagnosis present

## 2012-06-03 DIAGNOSIS — R471 Dysarthria and anarthria: Secondary | ICD-10-CM | POA: Diagnosis present

## 2012-06-03 DIAGNOSIS — Z23 Encounter for immunization: Secondary | ICD-10-CM

## 2012-06-03 DIAGNOSIS — E236 Other disorders of pituitary gland: Secondary | ICD-10-CM | POA: Insufficient documentation

## 2012-06-03 DIAGNOSIS — B1920 Unspecified viral hepatitis C without hepatic coma: Secondary | ICD-10-CM | POA: Diagnosis present

## 2012-06-03 DIAGNOSIS — M069 Rheumatoid arthritis, unspecified: Secondary | ICD-10-CM

## 2012-06-03 DIAGNOSIS — G459 Transient cerebral ischemic attack, unspecified: Secondary | ICD-10-CM

## 2012-06-03 DIAGNOSIS — E1142 Type 2 diabetes mellitus with diabetic polyneuropathy: Secondary | ICD-10-CM | POA: Diagnosis present

## 2012-06-03 DIAGNOSIS — L732 Hidradenitis suppurativa: Secondary | ICD-10-CM

## 2012-06-03 DIAGNOSIS — I10 Essential (primary) hypertension: Secondary | ICD-10-CM | POA: Diagnosis present

## 2012-06-03 HISTORY — DX: Obesity, unspecified: E66.9

## 2012-06-03 HISTORY — DX: Acute rheumatic endocarditis: I01.1

## 2012-06-03 HISTORY — DX: Hidradenitis suppurativa: L73.2

## 2012-06-03 HISTORY — DX: Other disorders of pituitary gland: E23.6

## 2012-06-03 LAB — CBC
HCT: 46.2 % — ABNORMAL HIGH (ref 36.0–46.0)
Hemoglobin: 15.7 g/dL — ABNORMAL HIGH (ref 12.0–15.0)
MCH: 29.7 pg (ref 26.0–34.0)
MCHC: 34 g/dL (ref 30.0–36.0)
MCV: 87.5 fL (ref 78.0–100.0)
Platelets: 301 10*3/uL (ref 150–400)
RBC: 5.28 MIL/uL — ABNORMAL HIGH (ref 3.87–5.11)
RDW: 14.8 % (ref 11.5–15.5)
WBC: 9.4 10*3/uL (ref 4.0–10.5)

## 2012-06-03 LAB — BASIC METABOLIC PANEL
BUN: 13 mg/dL (ref 6–23)
CO2: 20 mEq/L (ref 19–32)
Calcium: 9.5 mg/dL (ref 8.4–10.5)
Chloride: 103 mEq/L (ref 96–112)
Creatinine, Ser: 0.67 mg/dL (ref 0.50–1.10)
GFR calc Af Amer: >90 mL/min (ref >90)
GFR calc non Af Amer: >90 mL/min (ref >90)
Glucose, Bld: 84 mg/dL (ref 70–99)
Potassium: 4.8 mEq/L (ref 3.5–5.1)
Sodium: 136 mEq/L (ref 135–145)

## 2012-06-03 LAB — CBC WITH DIFFERENTIAL/PLATELET
Basophils Absolute: 0.1 10*3/uL (ref 0.0–0.1)
Basophils Relative: 1 % (ref 0–1)
Eosinophils Absolute: 0.4 10*3/uL (ref 0.0–0.7)
Eosinophils Relative: 4 % (ref 0–5)
HCT: 46.1 % — ABNORMAL HIGH (ref 36.0–46.0)
Hemoglobin: 16 g/dL — ABNORMAL HIGH (ref 12.0–15.0)
Lymphocytes Relative: 44 % (ref 12–46)
Lymphs Abs: 3.8 10*3/uL (ref 0.7–4.0)
MCH: 29.6 pg (ref 26.0–34.0)
MCHC: 34.7 g/dL (ref 30.0–36.0)
MCV: 85.2 fL (ref 78.0–100.0)
Monocytes Absolute: 0.6 10*3/uL (ref 0.1–1.0)
Monocytes Relative: 7 % (ref 3–12)
Neutro Abs: 3.8 10*3/uL (ref 1.7–7.7)
Neutrophils Relative %: 44 % (ref 43–77)
Platelets: 272 10*3/uL (ref 150–400)
RBC: 5.41 MIL/uL — ABNORMAL HIGH (ref 3.87–5.11)
RDW: 15.1 % (ref 11.5–15.5)
WBC: 8.5 10*3/uL (ref 4.0–10.5)

## 2012-06-03 LAB — HEPATIC FUNCTION PANEL
ALT: 18 U/L (ref 0–35)
AST: 30 U/L (ref 0–37)
Albumin: 3.5 g/dL (ref 3.5–5.2)
Alkaline Phosphatase: 94 U/L (ref 39–117)
Bilirubin, Direct: <0.1 mg/dL (ref 0.0–0.3)
Total Bilirubin: 0.4 mg/dL (ref 0.3–1.2)
Total Protein: 7.8 g/dL (ref 6.0–8.3)

## 2012-06-03 LAB — CREATININE, SERUM
Creatinine, Ser: 0.74 mg/dL (ref 0.50–1.10)
GFR calc Af Amer: >90 mL/min (ref >90)
GFR calc non Af Amer: >90 mL/min (ref >90)

## 2012-06-03 LAB — ANTITHROMBIN III: AntiThromb III Func: 112 % (ref 75–120)

## 2012-06-03 LAB — APTT: aPTT: 36 seconds (ref 24–37)

## 2012-06-03 LAB — PROTIME-INR
INR: 1.04 (ref 0.00–1.49)
Prothrombin Time: 13.5 seconds (ref 11.6–15.2)

## 2012-06-03 MED ORDER — SULFAMETHOXAZOLE-TRIMETHOPRIM 800-160 MG PO TABS: 1.0000 | ORAL_TABLET | Freq: Two times a day (BID) | ORAL | Status: DC

## 2012-06-03 MED ORDER — SODIUM CHLORIDE 0.9 % IV BOLUS (SEPSIS)
1000.0000 mL | Freq: Once | INTRAVENOUS | Status: AC
  Administered 2012-06-03: 1000 mL via INTRAVENOUS

## 2012-06-03 MED ORDER — LORAZEPAM 2 MG/ML IJ SOLN
0.5000 mg | Freq: Once | INTRAMUSCULAR | Status: AC
  Administered 2012-06-03: 23:00:00 via INTRAVENOUS
  Filled 2012-06-03: qty 1

## 2012-06-03 MED ORDER — TAMSULOSIN HCL 0.4 MG PO CAPS
0.4000 mg | ORAL_CAPSULE | Freq: Two times a day (BID) | ORAL | Status: DC
  Administered 2012-06-03 – 2012-06-04 (×2): 0.4 mg via ORAL
  Filled 2012-06-03 (×7): qty 1

## 2012-06-03 MED ORDER — METOCLOPRAMIDE HCL 5 MG/ML IJ SOLN
10.0000 mg | Freq: Once | INTRAMUSCULAR | Status: DC
  Filled 2012-06-03: qty 2

## 2012-06-03 MED ORDER — HEPARIN SODIUM (PORCINE) 5000 UNIT/ML IJ SOLN
5000.0000 [IU] | Freq: Three times a day (TID) | INTRAMUSCULAR | Status: DC
  Administered 2012-06-03 – 2012-06-05 (×5): 5000 [IU] via SUBCUTANEOUS
  Filled 2012-06-03 (×8): qty 1

## 2012-06-03 MED ORDER — SULFAMETHOXAZOLE-TMP DS 800-160 MG PO TABS
1.0000 | ORAL_TABLET | Freq: Two times a day (BID) | ORAL | Status: DC
  Administered 2012-06-03 – 2012-06-04 (×2): 1 via ORAL
  Filled 2012-06-03 (×4): qty 1

## 2012-06-03 NOTE — H&P (Signed)
[facility] History and Physical  [name] [name] WUJ:[id number] DOB: 11/28/1960 DOA: 06/03/2012  Referring physician: Forrestine Him Resident PCP: Geraldo Pitter, MD  Specialists: Neuro  Chief Complaint: ? TIA  HPI: [name] [name] is a 52 y.o. female with known medical issues and followed by [facility] primarily, presente dot [facility] ed 3/20 after being at [facility] this afternoon.  As she was pulling into her driveway she had a sensation that her mouth was going numb, then she felt like she was talking but was talking "gibberish" and not making any sense-Her daughter-in-law was sitting with her in the car at the time. This had happened before around Thanksgiving 201-she didn't present for medical therpay at the time.  NO physical symptoms accompanied her findings, no Seizure like acitvity, no LOC, No Bowel or urinaery incontinence Was able to walk out of the truck and felt a little nauseous She recently presented to [facility] with an abcess on 05/27/2012 and is still on antibiotics for this She is a tangential historian and tells me that she's been diagnosed with rheumatoid arthritis in 2008 and has also been told that she might have lupus. She goes on to relate that she does not have regular medical care and is in the process of getting her disability. Her disability diagnosis would be rheumatoid arthritis.  She relates that she had 2 vaginal deliveries and that after her second delivery 9010 months later she had a complete hysterectomy because of heavy bleeding and fibroids, making the diagnosis of Sheehan syndrome little less likely   Basic metabolic panel was completely normal Hemoglobin 16.0 with hematocrit of 46.1 indicating potential heme concentration otherwise labs were normal CT of the head showed empty sella partially empty sella  Review of Systems: The patient denies chest pain nausea vomiting shortness of breath blurred vision double vision does have frequent headaches which are not unusual for  her no falls or weakness however did feel unsteady initially after the episode this afternoon. Slurred speech was present but has since resolved. No dysuria no fever no chills    Past Medical History  Diagnosis Date  . Arthritis   . Hypertension   . Lupus    Past Surgical History  Procedure Laterality Date  . Abdominal hysterectomy    . Breast biopsy     Social History:  reports that she has been smoking Cigarettes.  She has been smoking about 0.50 packs per day. She does not have any smokeless tobacco history on file. She reports that she does not drink alcohol or use illicit drugs.   No Known Allergies  History reviewed. No pertinent family history.   Prior to Admission medications   Medication Sig Start Date End Date Taking? Authorizing Provider  acetaminophen (TYLENOL ARTHRITIS PAIN) 650 MG CR tablet Take 1,300 mg by mouth every 8 (eight) hours as needed. For pain   Yes Historical Provider, MD  sulfamethoxazole-trimethoprim (SEPTRA DS) 800-160 MG per tablet Take 1 tablet by mouth 2 (two) times daily. X 5 days 05/27/12  Yes Hayden Rasmussen, NP  Tamsulosin HCl (FLOMAX) 0.4 MG CAPS Take 1 capsule (0.4 mg total) by mouth 2 (two) times daily. 02/22/12  Yes Vida Roller, MD   Physical Exam: Filed Vitals:   06/03/12 1615 06/03/12 1630 06/03/12 1645 06/03/12 1700  BP: 177/87 151/74 139/83 145/92  Pulse: 74 68 76 74  Temp:      TempSrc:      Resp: 24 15 19 18   SpO2: 100% 96% 99% 97%  General:  Present obese after rectal female no apparent distress  Eyes: EOMI NCAT, vision to direct confrontation is normal finger-nose-finger test is normal  ENT: No twisting of the mouth, uvula midline tongue to the midline  Neck: Soft supple, sternocleidomastoid equally strong bilaterally including also temporalis musculature shoulder shrug is normal  Cardiovascular: S1-S2 no murmur or gallop  Respiratory: Clinically clear  Abdomen: Soft nontender nondistended  Skin: No lower extremity  pitting edema however mild swelling  Musculoskeletal: Range of motion  Psychiatric: Euthymic and pleasant  Neurologic: See above in addition power is 5/5 reflexes 2/3 coordination grossly is intact gait not assessed Babinski bilaterally downward  Labs on Admission:  Basic Metabolic Panel:  Recent Labs Lab 06/03/12 1534  NA 136  K 4.8  CL 103  CO2 20  GLUCOSE 84  BUN 13  CREATININE 0.67  CALCIUM 9.5   Liver Function Tests:  Recent Labs Lab 06/03/12 1534  AST 30  ALT 18  ALKPHOS 94  BILITOT 0.4  PROT 7.8  ALBUMIN 3.5   No results found for this basename: LIPASE, AMYLASE,  in the last 168 hours No results found for this basename: AMMONIA,  in the last 168 hours CBC:  Recent Labs Lab 06/03/12 1534  WBC 8.5  NEUTROABS 3.8  HGB 16.0*  HCT 46.1*  MCV 85.2  PLT 272   Cardiac Enzymes: No results found for this basename: CKTOTAL, CKMB, CKMBINDEX, TROPONINI,  in the last 168 hours  BNP (last 3 results) No results found for this basename: PROBNP,  in the last 8760 hours CBG: No results found for this basename: GLUCAP,  in the last 168 hours  Radiological Exams on Admission: Ct Head Wo Contrast  06/03/2012  *RADIOLOGY REPORT*  Clinical Data: Right facial and tongue numbness.  Nausea and headache.  CT HEAD WITHOUT CONTRAST  Technique:  Contiguous axial images were obtained from the base of the skull through the vertex without contrast.  Comparison: None.  Findings: The brain stem, cerebellum, cerebral peduncles, thalami, basal ganglia, basilar cisterns, and ventricular system appear unremarkable.  At least partially empty sella noted. No intracranial hemorrhage, mass lesion, or acute infarction is identified.  IMPRESSION:  1.  Empty sella or partially empty sella. 2.   Otherwise, no significant abnormality identified.   Original Report Authenticated By: Gaylyn Rong, M.D.     EKG: Independently reviewed. None performed  Assessment/Plan Active Problems:    Lupus   TIA (transient ischemic attack)   Empty sella syndrome   HTN (hypertension)   Rheumatoid arthritis   Hidradenitis axillaris   Obesity (BMI 30.0-34.9)   Hypertension   Hepatitis C   1. TIA-will get usual workup including MRI/carotid Dopplers/Echo-I. have already informed hospital service to see patient. Given her young age unusual presentation and prior issue with a stroke probably in November October of last year we'll get a hypercoagulable panel-she will need at least aspirin or now 2. ? Rheumatoid/Lupus-lupus anticoagulant anti-double-stranded DNA as well as rheumatoid factor have been ordered-patient states that she was on treatment with medications as recently as last year [facility] [place] 3. Hypertension-we will start patient on low dose of amlodipine in the morning 4. ? Empty sella syndrome-patient relates that she had excessive bleeding a couple of months after delivery of her last child in 73-1990. This could represent Sheehan syndrome. At present time as the getting MRI anyway, I will await the result of that and then test and screen for hormonal deficiencies associated with same.  It is very unlikely that she would have significant deficiencies given it has been almost 20 years since that event 5. Recent hidradenitis-patient completed course of Bactrim 6. ? Hepatitis C.-will get hepatitis C antibodies by laser screening to screen for the same. If this turns out positive, patient may benefit from consul at [facility] for infectious disease here in [place] as an outpatient  Neuro hospital service was consulted  Code Status: Full (must indicate code status--if unknown or must be presumed, indicate so) Family Communication: Cousin at bedside (indicate person spoken with, if applicable, with phone number if by telephone) Disposition Plan: Inpatient (indicate anticipated LOS)  Time spent: 86  Mahala Menghini [facility] Pager [phone]  If  7PM-7AM, please contact night-coverage [email or web address] Password [facility] 06/03/2012, 5:24 PM

## 2012-06-03 NOTE — ED Provider Notes (Signed)
History     CSN: [id number]  Arrival date & time 06/03/12  1316   First MD Initiated Contact with Patient 06/03/12 1329      Chief Complaint  Patient presents with  . Numbness    (Consider location/radiation/quality/duration/timing/severity/associated sxs/prior treatment) Patient is a 52 y.o. female presenting with neurologic complaint. The history is provided by the patient.  Neurologic Problem The primary symptoms include speech change. Primary symptoms do not include fever, nausea or vomiting. The symptoms began 1 to 2 hours ago. The episode lasted 15 minutes. The symptoms are resolved. Context: spontaneously.  Features of the speech change include inability to articulate.  Associated symptoms comments: Tongue numbness, facial droooping, headache.    Past Medical History  Diagnosis Date  . Arthritis   . Hypertension   . Lupus     Past Surgical History  Procedure Laterality Date  . Abdominal hysterectomy    . Breast biopsy      History reviewed. No pertinent family history.  History  Substance Use Topics  . Smoking status: Current Every Day Smoker -- 0.50 packs/day    Types: Cigarettes  . Smokeless tobacco: Not on file  . Alcohol Use: No    OB History   Grav Para Term Preterm Abortions TAB SAB Ect Mult Living                  Review of Systems  Constitutional: Negative for fever.  HENT: Negative for congestion.   Respiratory: Negative for cough and shortness of breath.   Cardiovascular: Negative for chest pain.  Gastrointestinal: Negative for nausea, vomiting, abdominal pain and diarrhea.  Genitourinary: Negative for difficulty urinating.  Neurological: Positive for speech change.  All other systems reviewed and are negative.    Allergies  Review of patient's allergies indicates no known allergies.  Home Medications   Current Outpatient Rx  Name  Route  Sig  Dispense  Refill  . acetaminophen (TYLENOL ARTHRITIS PAIN) 650 MG CR tablet   Oral  Take 1,300 mg by mouth every 8 (eight) hours as needed. For pain         . sulfamethoxazole-trimethoprim (SEPTRA DS) 800-160 MG per tablet   Oral   Take 1 tablet by mouth 2 (two) times daily. X 5 days   10 tablet   0   . Tamsulosin HCl (FLOMAX) 0.4 MG CAPS   Oral   Take 1 capsule (0.4 mg total) by mouth 2 (two) times daily.   10 capsule   0     BP 153/88  Pulse 78  Temp(Src) 98.6 F (37 C) (Oral)  Resp 21  SpO2 99%  Physical Exam  Nursing note and vitals reviewed. Constitutional: She is oriented to person, place, and time. She appears well-developed and well-nourished. No distress.  HENT:  Head: Normocephalic and atraumatic.  Mouth/Throat: Oropharynx is clear and moist.  Eyes: Conjunctivae are normal. Pupils are equal, round, and reactive to light. No scleral icterus.  Neck: Neck supple.  Cardiovascular: Normal rate, regular rhythm, normal heart sounds and intact distal pulses.   No murmur heard. Pulmonary/Chest: Effort normal and breath sounds normal. No stridor. No respiratory distress. She has no rales.  Abdominal: Soft. Bowel sounds are normal. She exhibits no distension. There is no tenderness.  Musculoskeletal: Normal range of motion.  Neurological: She is alert and oriented to person, place, and time. She has normal strength. No cranial nerve deficit or sensory deficit. Coordination and gait normal. GCS eye subscore is 4. GCS verbal  subscore is 5. GCS motor subscore is 6.  Skin: Skin is warm and dry. No rash noted.  Psychiatric: She has a normal mood and affect. Her behavior is normal.    ED Course  Procedures (including critical care time)  Labs Reviewed  CBC WITH DIFFERENTIAL - Abnormal; Notable for the following:    RBC 5.41 (*)    Hemoglobin 16.0 (*)    HCT 46.1 (*)    All other components within normal limits  BASIC METABOLIC PANEL  HEPATIC FUNCTION PANEL  PROTIME-INR  APTT  ANTITHROMBIN III  PROTEIN C ACTIVITY  PROTEIN C, TOTAL  PROTEIN S  ACTIVITY  PROTEIN S, TOTAL  LUPUS ANTICOAGULANT PANEL  BETA-2-GLYCOPROTEIN I ABS, IGG/M/A  HOMOCYSTEINE  FACTOR 5 LEIDEN  CARDIOLIPIN ANTIBODIES, IGG, IGM, IGA  ANTI-DNA ANTIBODY, DOUBLE-STRANDED  HEPATITIS C ANTIBODY  RHEUMATOID FACTOR   Ct Head Wo Contrast  06/03/2012  *RADIOLOGY REPORT*  Clinical Data: Right facial and tongue numbness.  Nausea and headache.  CT HEAD WITHOUT CONTRAST  Technique:  Contiguous axial images were obtained from the base of the skull through the vertex without contrast.  Comparison: None.  Findings: The brain stem, cerebellum, cerebral peduncles, thalami, basal ganglia, basilar cisterns, and ventricular system appear unremarkable.  At least partially empty sella noted. No intracranial hemorrhage, mass lesion, or acute infarction is identified.  IMPRESSION:  1.  Empty sella or partially empty sella. 2.   Otherwise, no significant abnormality identified.   Original Report Authenticated By: Gaylyn Rong, M.D.   All radiology studies independently viewed by me.      1. TIA   MDM  52 yo female with history concerning for TIA.  Neuro exam normal.  CT head shows nothing acute, does show empty sella (which was relayed to patient).  Plan to admit to internal medicine for TIA workup.          Rennis Petty, MD 06/03/12 [id number]

## 2012-06-03 NOTE — ED Notes (Signed)
Pt presents with onset of numbness to R side of face and tongue while driving from [employer].  Pt reports difficulty talking - occurred x 10 minutes ago.  Pt reports symptoms are resolved at present.  Pt reports nausea and headache that have worsened x 3 months.

## 2012-06-03 NOTE — Consult Note (Signed)
Referring Physician: Dr. Robb Matar    Chief Complaint: Transient facial numbness and speech difficulty.  HPI: [name] [name] is an 52 y.o. female with a history of hypertension, lupus and rheumatoid arthritis who came to the emergency room following an episode of transient speech abnormality and numbness involving left side of her face. She had a headache at the time as well. Symptoms lasted about 20 minutes. Speech was described as gibberish. Patient had a similar spell in November 2013 but did not seek medical attention. She has not been on antiplatelet therapy. CT scan of the head showed no acute intracranial abnormality. NIH score at the time of this evaluation was 0.  LSN:  tPA Given: No: Symptoms resolved MRankin: 0  Past Medical History  Diagnosis Date  . Arthritis   . Hypertension   . Lupus   . Rheumatoid aortitis   . Empty sella   . Hidradenitis axillaris   . Obesity (BMI 30.0-34.9)   . Hepatitis C     Family History  Problem Relation Age of Onset  . Diabetes Mother   . Stroke Maternal Aunt      Medications:  Prior to Admission:  Prescriptions prior to admission  Medication Sig Dispense Refill  . acetaminophen (TYLENOL ARTHRITIS PAIN) 650 MG CR tablet Take 1,300 mg by mouth every 8 (eight) hours as needed. For pain      . sulfamethoxazole-trimethoprim (SEPTRA DS) 800-160 MG per tablet Take 1 tablet by mouth 2 (two) times daily. X 5 days  10 tablet  0  . Tamsulosin HCl (FLOMAX) 0.4 MG CAPS Take 1 capsule (0.4 mg total) by mouth 2 (two) times daily.  10 capsule  0     Physical Examination: Blood pressure 157/98, pulse 74, temperature 97.9 F (36.6 C), temperature source Oral, resp. rate 18, SpO2 100.00%.  Neurologic Examination: Mental Status: Alert, oriented, thought content appropriate.  Speech fluent without evidence of aphasia. Able to follow commands without difficulty. Cranial Nerves: II-Visual fields were normal. III/IV/VI-Pupils were equal and reacted.  Extraocular movements were full and conjugate.    V/VII-no facial numbness and no facial weakness. VIII-normal. X-normal speech and symmetrical palatal movement. XII-midline tongue extension Motor: 5/5 bilaterally with normal tone and bulk Sensory: Normal throughout. Deep Tendon Reflexes: 1+ and symmetric. Plantars: Flexor bilaterally Cerebellar: Normal finger-to-nose testing. Carotid auscultation: Normal  Ct Head Wo Contrast  06/03/2012  *RADIOLOGY REPORT*  Clinical Data: Right facial and tongue numbness.  Nausea and headache.  CT HEAD WITHOUT CONTRAST  Technique:  Contiguous axial images were obtained from the base of the skull through the vertex without contrast.  Comparison: None.  Findings: The brain stem, cerebellum, cerebral peduncles, thalami, basal ganglia, basilar cisterns, and ventricular system appear unremarkable.  At least partially empty sella noted. No intracranial hemorrhage, mass lesion, or acute infarction is identified.  IMPRESSION:  1.  Empty sella or partially empty sella. 2.   Otherwise, no significant abnormality identified.   Original Report Authenticated By: Gaylyn Rong, M.D.     Assessment: 52 y.o. female presenting with probable transient ischemic attack. It's likely that she had transient transient ischemic attack as well in November 2013.  Stroke Risk Factors - hypertension  Plan: 1. HgbA1c, fasting lipid panel 2. MRI, MRA  of the brain without contrast 3. PT consult, OT consult, Speech consult 4. Echocardiogram 5. Carotid dopplers 6. Prophylactic therapy-Antiplatelet med: Aspirin 81 mg per day 7. Risk factor modification 8. Telemetry monitoring   C.R. Roseanne Reno, MD [facility] [phone]  06/03/2012, 9:24 PM

## 2012-06-03 NOTE — ED Provider Notes (Signed)
52 year old female had an episode of questionable facial droop and difficulty speaking. This lasted about 15 minutes. She noted tingling in both hands but no focal numbness. She denies any headache. Similar episode about 3 months ago which resolved and she never sought treatment for it. On exam, cranial nerves are intact and speech is normal. There are no carotid bruits. Lungs are clear and heart has regular rate and rhythm. There no focal neurologic findings. She apparently did have a transient ischemic attack and will need to be admitted for TIA workup.  I saw and evaluated the patient, reviewed the resident's note and I agree with the findings and plan.   Dione Booze, MD 06/03/12 [phone]

## 2012-06-04 ENCOUNTER — Inpatient Hospital Stay (HOSPITAL_COMMUNITY): Payer: Medicare Other

## 2012-06-04 ENCOUNTER — Encounter (HOSPITAL_COMMUNITY): Payer: Self-pay | Admitting: General Practice

## 2012-06-04 DIAGNOSIS — I517 Cardiomegaly: Secondary | ICD-10-CM

## 2012-06-04 DIAGNOSIS — I6350 Cerebral infarction due to unspecified occlusion or stenosis of unspecified cerebral artery: Principal | ICD-10-CM

## 2012-06-04 DIAGNOSIS — E1149 Type 2 diabetes mellitus with other diabetic neurological complication: Secondary | ICD-10-CM

## 2012-06-04 DIAGNOSIS — G459 Transient cerebral ischemic attack, unspecified: Secondary | ICD-10-CM

## 2012-06-04 DIAGNOSIS — E1165 Type 2 diabetes mellitus with hyperglycemia: Secondary | ICD-10-CM | POA: Diagnosis present

## 2012-06-04 DIAGNOSIS — Z794 Long term (current) use of insulin: Secondary | ICD-10-CM | POA: Diagnosis present

## 2012-06-04 DIAGNOSIS — I10 Essential (primary) hypertension: Secondary | ICD-10-CM

## 2012-06-04 LAB — ANTI-DNA ANTIBODY, DOUBLE-STRANDED: ds DNA Ab: 4 IU/mL (ref <30)

## 2012-06-04 LAB — LUPUS ANTICOAGULANT PANEL
DRVVT: 38.5 secs (ref <42.9)
Lupus Anticoagulant: NOT DETECTED
PTT Lupus Anticoagulant: 36.9 secs (ref 28.0–43.0)

## 2012-06-04 LAB — RAPID URINE DRUG SCREEN, HOSP PERFORMED
Amphetamines: NOT DETECTED
Barbiturates: NOT DETECTED
Benzodiazepines: NOT DETECTED
Cocaine: NOT DETECTED
Opiates: NOT DETECTED
Tetrahydrocannabinol: NOT DETECTED

## 2012-06-04 LAB — PROTEIN S ACTIVITY: Protein S Activity: 109 % (ref 69–129)

## 2012-06-04 LAB — GLUCOSE, CAPILLARY
Glucose-Capillary: 113 mg/dL — ABNORMAL HIGH (ref 70–99)
Glucose-Capillary: 128 mg/dL — ABNORMAL HIGH (ref 70–99)
Glucose-Capillary: 154 mg/dL — ABNORMAL HIGH (ref 70–99)
Glucose-Capillary: 169 mg/dL — ABNORMAL HIGH (ref 70–99)
Glucose-Capillary: 219 mg/dL — ABNORMAL HIGH (ref 70–99)

## 2012-06-04 LAB — CARDIOLIPIN ANTIBODIES, IGG, IGM, IGA
Anticardiolipin IgA: 11 APL U/mL — ABNORMAL LOW (ref <22)
Anticardiolipin IgG: 2 GPL U/mL — ABNORMAL LOW (ref <23)
Anticardiolipin IgM: 3 MPL U/mL — ABNORMAL LOW (ref <11)

## 2012-06-04 LAB — BETA-2-GLYCOPROTEIN I ABS, IGG/M/A
Beta-2 Glyco I IgG: 0 G Units (ref <20)
Beta-2-Glycoprotein I IgA: 8 A Units (ref <20)
Beta-2-Glycoprotein I IgM: 4 M Units (ref <20)

## 2012-06-04 LAB — HOMOCYSTEINE: Homocysteine: 11.7 umol/L (ref 4.0–15.4)

## 2012-06-04 LAB — RHEUMATOID FACTOR: Rhuematoid fact SerPl-aCnc: <10 IU/mL (ref <=14)

## 2012-06-04 LAB — LIPID PANEL
Cholesterol: 258 mg/dL — ABNORMAL HIGH (ref 0–200)
HDL: 44 mg/dL (ref >39)
LDL Cholesterol: 144 mg/dL — ABNORMAL HIGH (ref 0–99)
Total CHOL/HDL Ratio: 5.9 RATIO
Triglycerides: 348 mg/dL — ABNORMAL HIGH (ref <150)
VLDL: 70 mg/dL — ABNORMAL HIGH (ref 0–40)

## 2012-06-04 LAB — HEMOGLOBIN A1C
Hgb A1c MFr Bld: 7.3 % — ABNORMAL HIGH (ref <5.7)
Mean Plasma Glucose: 163 mg/dL — ABNORMAL HIGH (ref <117)

## 2012-06-04 LAB — PROTEIN S, TOTAL: Protein S Ag, Total: 100 % (ref 60–150)

## 2012-06-04 LAB — HEPATITIS C ANTIBODY: HCV Ab: NEGATIVE

## 2012-06-04 LAB — PROTEIN C ACTIVITY: Protein C Activity: 200 % — ABNORMAL HIGH (ref 75–133)

## 2012-06-04 LAB — PROTEIN C, TOTAL: Protein C, Total: 128 % (ref 72–160)

## 2012-06-04 MED ORDER — ASPIRIN 325 MG PO TABS
325.0000 mg | ORAL_TABLET | Freq: Every day | ORAL | Status: DC
  Administered 2012-06-04 – 2012-06-05 (×2): 325 mg via ORAL
  Filled 2012-06-04 (×3): qty 1

## 2012-06-04 MED ORDER — LISINOPRIL 10 MG PO TABS
10.0000 mg | ORAL_TABLET | Freq: Every day | ORAL | Status: DC
  Administered 2012-06-04 – 2012-06-05 (×2): 10 mg via ORAL
  Filled 2012-06-04 (×2): qty 1

## 2012-06-04 MED ORDER — ATORVASTATIN CALCIUM 40 MG PO TABS
40.0000 mg | ORAL_TABLET | Freq: Every day | ORAL | Status: DC
  Administered 2012-06-04: 40 mg via ORAL
  Filled 2012-06-04 (×2): qty 1

## 2012-06-04 MED ORDER — INSULIN GLARGINE 100 UNIT/ML ~~LOC~~ SOLN
10.0000 [IU] | Freq: Every day | SUBCUTANEOUS | Status: DC
  Administered 2012-06-04: 10 [IU] via SUBCUTANEOUS
  Filled 2012-06-04 (×2): qty 0.1

## 2012-06-04 NOTE — Discharge Summary (Signed)
[facility] PROGRESS NOTE  Assessment/Plan:  CVA (cerebral infarction): - MRI 3.21.2014 as below, acute stroke. - Carotid doppler no ICA stenosis. - ehco pending. - ASA, statins.  - HBgA1c high, metformin as an outpatient.  DM II: - Lantus for now. - metformin as an outpatient.    Lupus: - stable, follow up with PCP.    Hypertension: - High start lisinopril.    Obesity (BMI 30.0-34.9) - counseling    Code Status: full Family Communication: none  Disposition Plan: home   Consultants:  none  Procedures: - MRI 3.21.2014: Multifocal areas of acute infarction affect the left posterior frontal, and parietal cortex as well as the associated subcortical white matter. No hemorrhage. - carotid doppler 3.21.2014: NO ICA stenosis.   Antibiotics:  none (indicate start date, and stop date if known)  HPI/Subjective: Feels better symptoms almost resolved.  Objective: Filed Vitals:   06/04/12 0107 06/04/12 0307 06/04/12 0536 06/04/12 1000  BP: 133/73 137/79 151/80 142/72  Pulse: 85 81 87 88  Temp: 98.3 F (36.8 C) 97.6 F (36.4 C) 97.9 F (36.6 C) 97.7 F (36.5 C)  TempSrc: Oral Oral Oral Oral  Resp: 16 20 18 18   Height:  5\' 7"  (1.702 m)    Weight:  99.428 kg (219 lb 3.2 oz)    SpO2: 98% 100% 95% 98%   No intake or output data in the 24 hours ending 06/04/12 1149 Filed Weights   06/04/12 0307  Weight: 99.428 kg (219 lb 3.2 oz)    Exam:  General: Alert, awake, oriented x3, in no acute distress.  HEENT: No bruits, no goiter.  Heart: Regular rate and rhythm, without murmurs, rubs, gallops.  Lungs: Good air movement, clear to auscultation Abdomen: Soft, nontender, nondistended, positive bowel sounds.  Neuro: Grossly intact, nonfocal.   Data Reviewed: Basic Metabolic Panel:  Recent Labs Lab 06/03/12 1534 06/03/12 1955  NA 136  --   K 4.8  --   CL 103  --   CO2 20  --   GLUCOSE 84  --   BUN 13  --   CREATININE 0.67 0.74  CALCIUM 9.5  --     Liver Function Tests:  Recent Labs Lab 06/03/12 1534  AST 30  ALT 18  ALKPHOS 94  BILITOT 0.4  PROT 7.8  ALBUMIN 3.5   No results found for this basename: LIPASE, AMYLASE,  in the last 168 hours No results found for this basename: AMMONIA,  in the last 168 hours CBC:  Recent Labs Lab 06/03/12 1534 06/03/12 1955  WBC 8.5 9.4  NEUTROABS 3.8  --   HGB 16.0* 15.7*  HCT 46.1* 46.2*  MCV 85.2 87.5  PLT 272 301   Cardiac Enzymes: No results found for this basename: CKTOTAL, CKMB, CKMBINDEX, TROPONINI,  in the last 168 hours BNP (last 3 results) No results found for this basename: PROBNP,  in the last 8760 hours CBG:  Recent Labs Lab 06/04/12 0051 06/04/12 0646 06/04/12 1118  GLUCAP 169* 154* 113*    Recent Results (from the past 240 hour(s))  CULTURE, ROUTINE-ABSCESS     Status: None   Collection Time    05/27/12 11:47 AM      Result Value Range Status   Specimen Description ABSCESS AXILLA RIGHT   Final   Special Requests NONE   Final   Gram Stain     Final   Value: MODERATE WBC PRESENT,BOTH PMN AND MONONUCLEAR     FEW SQUAMOUS EPITHELIAL CELLS PRESENT  FEW GRAM POSITIVE COCCI IN CLUSTERS     IN PAIRS   Culture     Final   Value: MODERATE STAPHYLOCOCCUS SPECIES (COAGULASE NEGATIVE)     Note: RIFAMPIN AND GENTAMICIN SHOULD NOT BE USED AS SINGLE DRUGS FOR TREATMENT OF STAPH INFECTIONS.   Report Status 05/30/2012 FINAL   Final   Organism ID, Bacteria STAPHYLOCOCCUS SPECIES (COAGULASE NEGATIVE)   Final     Studies: Ct Head Wo Contrast  06/03/2012  *RADIOLOGY REPORT*  Clinical Data: Right facial and tongue numbness.  Nausea and headache.  CT HEAD WITHOUT CONTRAST  Technique:  Contiguous axial images were obtained from the base of the skull through the vertex without contrast.  Comparison: None.  Findings: The brain stem, cerebellum, cerebral peduncles, thalami, basal ganglia, basilar cisterns, and ventricular system appear unremarkable.  At least partially  empty sella noted. No intracranial hemorrhage, mass lesion, or acute infarction is identified.  IMPRESSION:  1.  Empty sella or partially empty sella. 2.   Otherwise, no significant abnormality identified.   Original Report Authenticated By: Gaylyn Rong, M.D.    Mri Brain Without Contrast  06/04/2012  *RADIOLOGY REPORT*  Clinical Data:  Slurred speech and left facial numbness.  Headache. History of lupus.  MRI HEAD WITHOUT CONTRAST MRA HEAD WITHOUT CONTRAST  Technique:  Multiplanar, multiecho pulse sequences of the brain and surrounding structures were obtained without intravenous contrast. Angiographic images of the head were obtained using MRA technique without contrast.  Comparison:  CT head 06/03/2012.  MRI HEAD  Findings:  Multiple areas of restricted diffusion affect the left posterior frontal and parietal cortex as well as the associated subcortical and periventricular white matter consistent with acute infarction.  There is no associated hemorrhage or mass effect.  The ventricles are normal in size and midline.  Slight atrophy is present.  Moderate chronic microvascular ischemic change is noted, nonspecific in nature.  No large vessel infarct.  Marked empty sella.  No tonsillar herniation.  Negative osseous structures. Mild chronic sinus disease.  IMPRESSION: Multifocal areas of acute infarction affect the left posterior frontal, and parietal cortex as well as the associated subcortical white matter.  No hemorrhage.  Chronic white matter changes as described.  MRA HEAD  Findings: The right carotid system is normal.  The left carotid system is normal.  The basilar artery shows mild proximal irregularity without flow limiting stenosis.  Vertebrals are codominant without stenosis.  Unremarkable right ACA and MCA.  Left MCA is abnormal demonstrating a 50-75% stenosis proximal M1 segment and a 75-90% stenosis in the distal M1 segment affecting the trifurcation branches. Only one of the normal three left  MCA M3 branches displays distal flow related enhancement, and that vessel is reduced in size and intensity.  The right PCA is diffusely diseased with no significant abnormality of the left PCA.  There is no cerebellar branch occlusion. There is no visible intracranial aneurysm.  IMPRESSION: Significant narrowing involving the proximal and distal left middle cerebral artery and its major branches with decreased number of visualized proximal M2 and M3 vessels. Similar diffuse changes distal right PCA.  Atherosclerosis versus lupus vasculitis are considerations.  The findings likely represent a hemodynamically significant left MCA intracranial stenosis.  Formal catheter angiogram may be warranted.   Original Report Authenticated By: Davonna Belling, M.D.    Mr Mra Head/brain Wo Cm  06/04/2012  *RADIOLOGY REPORT*  Clinical Data:  Slurred speech and left facial numbness.  Headache. History of lupus.  MRI HEAD WITHOUT CONTRAST  MRA HEAD WITHOUT CONTRAST  Technique:  Multiplanar, multiecho pulse sequences of the brain and surrounding structures were obtained without intravenous contrast. Angiographic images of the head were obtained using MRA technique without contrast.  Comparison:  CT head 06/03/2012.  MRI HEAD  Findings:  Multiple areas of restricted diffusion affect the left posterior frontal and parietal cortex as well as the associated subcortical and periventricular white matter consistent with acute infarction.  There is no associated hemorrhage or mass effect.  The ventricles are normal in size and midline.  Slight atrophy is present.  Moderate chronic microvascular ischemic change is noted, nonspecific in nature.  No large vessel infarct.  Marked empty sella.  No tonsillar herniation.  Negative osseous structures. Mild chronic sinus disease.  IMPRESSION: Multifocal areas of acute infarction affect the left posterior frontal, and parietal cortex as well as the associated subcortical white matter.  No hemorrhage.   Chronic white matter changes as described.  MRA HEAD  Findings: The right carotid system is normal.  The left carotid system is normal.  The basilar artery shows mild proximal irregularity without flow limiting stenosis.  Vertebrals are codominant without stenosis.  Unremarkable right ACA and MCA.  Left MCA is abnormal demonstrating a 50-75% stenosis proximal M1 segment and a 75-90% stenosis in the distal M1 segment affecting the trifurcation branches. Only one of the normal three left MCA M3 branches displays distal flow related enhancement, and that vessel is reduced in size and intensity.  The right PCA is diffusely diseased with no significant abnormality of the left PCA.  There is no cerebellar branch occlusion. There is no visible intracranial aneurysm.  IMPRESSION: Significant narrowing involving the proximal and distal left middle cerebral artery and its major branches with decreased number of visualized proximal M2 and M3 vessels. Similar diffuse changes distal right PCA.  Atherosclerosis versus lupus vasculitis are considerations.  The findings likely represent a hemodynamically significant left MCA intracranial stenosis.  Formal catheter angiogram may be warranted.   Original Report Authenticated By: Davonna Belling, M.D.     Scheduled Meds: . aspirin  325 mg Oral Daily  . heparin  5,000 Units Subcutaneous Q8H  . sulfamethoxazole-trimethoprim  1 tablet Oral Q12H  . tamsulosin  0.4 mg Oral BID   Continuous Infusions:    Marinda Elk  [facility] Pager [phone]. If 8PM-8AM, please contact night-coverage at [email or web address], password [facility] 06/04/2012, 11:49 AM  LOS: 1 day

## 2012-06-04 NOTE — Progress Notes (Signed)
  Echocardiogram 2D Echocardiogram has been performed.  [name] [name] 06/04/2012, 9:55 AM

## 2012-06-04 NOTE — Evaluation (Signed)
Physical Therapy Evaluation Patient Details Name: MALANIA GAWTHROP MRN: [id number] DOB: 12-10-1960 Today's Date: 06/04/2012 Time: 3664-4034 PT Time Calculation (min): 29 min  PT Assessment / Plan / Recommendation Clinical Impression  52 y.o. female with a history of hypertension, lupus and rheumatoid arthritis who came to the emergency room following an episode of transient speech abnormality and numbness involving left side of her face. MRI identified multifocal areas of acute infarction affecting the left posterior frontal, and parietal cortex as well as the associated subcortical white matter.   Presents to PT today at or near her baseline with mobility. Education provided concerning her risk factors for CVA including HTN, sedentary lifestyle and smoking as well as sxs of CVA and the importance of immediate medical attention should she develop any of these symptoms. Pt verbalizes understanding. Education also provided concerning beginning an exercise program and energy conservation especially with her diagnosis of RA. Pt appears very knowledgeable and verbalizes understanding.  No further PT needs at this time. Patient is aware that if her mobility becomes further impaired from the RA she can seek an OPPT prescription.    PT Assessment  Patent does not need any further PT services    Follow Up Recommendations  No PT follow up    Does the patient have the potential to tolerate intense rehabilitation      Barriers to Discharge        Equipment Recommendations  None recommended by PT    Recommendations for Other Services     Frequency      Precautions / Restrictions Precautions Precautions: None Precaution Comments: has RA and Lupus Restrictions Weight Bearing Restrictions: No   Pertinent Vitals/Pain Does endorse a headache but she reports her RN is aware of this      Mobility  Bed Mobility Bed Mobility: Supine to Sit Supine to Sit: 6: Modified independent (Device/Increase  time);With rails;HOB elevated (30 degrees) Transfers Transfers: Sit to Stand;Stand to Sit Sit to Stand: 7: Independent;From bed Stand to Sit: 7: Independent;To bed Ambulation/Gait Ambulation/Gait Assistance: 6: Modified independent (Device/Increase time) Ambulation Distance (Feet): 500 Feet Assistive device: None Ambulation/Gait Assistance Details: walks with a slow pace because of some pain in her feet, able to adjust her speed and avoid objects in the hallway with little difficulty, did stagger a couple of times but able to correct without assist; denies a history of falls Gait Pattern: Within Functional Limits Stairs: Yes Stairs Assistance: 7: Independent Stair Management Technique: No rails;Step to pattern;Forwards Number of Stairs: 3        Visit Information  Last PT Received On: 06/04/12 Assistance Needed: +1    Subjective Data  Subjective: I feel back to normal. 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, she also is disabled but has been managing fine with cooking/cleaning; her son and his wife also live with them and can help out as well but are both full time students; her husband has equipment she can borrow if she needs it (RW, cane, w/c); she reports her family is very attentive and keeps a good eye on her Prior Function Level of Independence: Independent Able to Take Stairs?: Yes Driving: Yes Vocation: On disability Comments: just diagnosed with RA and Lupus in  2012 Communication Communication: No difficulties    Cognition  Cognition Overall Cognitive Status: Appears within functional limits for tasks assessed/performed Arousal/Alertness:  Awake/alert Orientation Level: Appears intact for tasks assessed Behavior During Session: [facility] for tasks performed    Extremity/Trunk Assessment Right Upper Extremity Assessment RUE ROM/Strength/Tone: Within functional levels RUE Sensation: WFL - Light Touch RUE Coordination: WFL - gross/fine motor Left Upper Extremity Assessment LUE ROM/Strength/Tone: Within functional levels LUE Sensation: WFL - Light Touch LUE Coordination: WFL - gross/fine motor Right Lower Extremity Assessment RLE ROM/Strength/Tone: Within functional levels RLE Sensation: WFL - Light Touch RLE Coordination: WFL - gross/fine motor Left Lower Extremity Assessment LLE ROM/Strength/Tone: Within functional levels LLE Sensation: WFL - Light Touch LLE Coordination: WFL - gross/fine motor Trunk Assessment Trunk Assessment: Normal   Balance    End of Session PT - End of Session Equipment Utilized During Treatment: Gait belt Activity Tolerance: Patient tolerated treatment well Patient left: in bed;with call bell/phone within reach Nurse Communication: Mobility status  GP     [facility] HELEN 06/04/2012, 1:25 PM

## 2012-06-04 NOTE — Progress Notes (Addendum)
Pt had brief episode of recurrent CVA symptoms (slurred speech, R sided facial numbness).  All resolved currently, MD notified, STAT head CT ordered. Will continue to monitor closely.

## 2012-06-04 NOTE — Progress Notes (Signed)
*  PRELIMINARY RESULTS* Vascular Ultrasound Carotid Duplex (Doppler) has been completed.  Preliminary findings: Bilateral: No hemodynamically significat ICA stenosis. Vertebral artery flow is antegrade.  Cathie Beams 06/04/2012, 9:26 AM

## 2012-06-04 NOTE — Progress Notes (Signed)
Stroke Team Progress Note  HISTORY  [name] [name] is an 52 y.o. female with a history of hypertension, lupus and rheumatoid arthritis who came to the emergency room following an episode of transient speech abnormality and numbness involving left side of her face. She had a headache at the time as well. Symptoms lasted about 20 minutes. Speech was described as gibberish. Patient had a similar spell in November 2013 but did not seek medical attention. She has not been on antiplatelet therapy. CT scan of the head showed no acute intracranial abnormality. NIH score at the time of this evaluation was 0.   LSN: 1230  tPA Given: No: Symptoms resolved  MRankin: 0  Patient was not a TPA candidate secondary to resolution of symptoms. She was admitted to the neuro floor for further evaluation and treatment.  SUBJECTIVE She is lying in bed with lights out stating lights bother her .  Overall she feels her condition has somewhat improved but still complains of diffuse headache; however states that her speech has remained normal.  OBJECTIVE Most recent Vital Signs: Filed Vitals:   06/04/12 0107 06/04/12 0307 06/04/12 0536 06/04/12 1000  BP: 133/73 137/79 151/80 142/72  Pulse: 85 81 87 88  Temp: 98.3 F (36.8 C) 97.6 F (36.4 C) 97.9 F (36.6 C) 97.7 F (36.5 C)  TempSrc: Oral Oral Oral Oral  Resp: 16 20 18 18   Height:  5\' 7"  (1.702 m)    Weight:  99.428 kg (219 lb 3.2 oz)    SpO2: 98% 100% 95% 98%   CBG (last 3)   Recent Labs  06/04/12 0051 06/04/12 0646 06/04/12 1118  GLUCAP 169* 154* 113*    IV Fluid Intake:     MEDICATIONS  . aspirin  325 mg Oral Daily  . atorvastatin  40 mg Oral q1800  . heparin  5,000 Units Subcutaneous Q8H  . insulin glargine  10 Units Subcutaneous QHS  . lisinopril  10 mg Oral Daily  . tamsulosin  0.4 mg Oral BID   PRN:    Diet:  Cardiac thin liquids Activity:  Bathroom privileges with assistance DVT Prophylaxis:  Heparin 5000u sq q 8  hours  CLINICALLY SIGNIFICANT STUDIES Basic Metabolic Panel:  Recent Labs Lab 06/03/12 1534 06/03/12 1955  NA 136  --   K 4.8  --   CL 103  --   CO2 20  --   GLUCOSE 84  --   BUN 13  --   CREATININE 0.67 0.74  CALCIUM 9.5  --    Liver Function Tests:  Recent Labs Lab 06/03/12 1534  AST 30  ALT 18  ALKPHOS 94  BILITOT 0.4  PROT 7.8  ALBUMIN 3.5   CBC:  Recent Labs Lab 06/03/12 1534 06/03/12 1955  WBC 8.5 9.4  NEUTROABS 3.8  --   HGB 16.0* 15.7*  HCT 46.1* 46.2*  MCV 85.2 87.5  PLT 272 301   Coagulation:  Recent Labs Lab 06/03/12 1854  LABPROT 13.5  INR 1.04   Lipid Panel    Component Value Date/Time   CHOL 258* 06/04/2012 0630   TRIG 348* 06/04/2012 0630   HDL 44 06/04/2012 0630   CHOLHDL 5.9 06/04/2012 0630   VLDL 70* 06/04/2012 0630   LDLCALC 144* 06/04/2012 0630   HgbA1C  Lab Results  Component Value Date   HGBA1C 7.3* 06/03/2012    Urine Drug Screen:     Component Value Date/Time   LABOPIA NONE DETECTED 06/04/2012 0448   COCAINSCRNUR  NONE DETECTED 06/04/2012 0448   LABBENZ NONE DETECTED 06/04/2012 0448   AMPHETMU NONE DETECTED 06/04/2012 0448   THCU NONE DETECTED 06/04/2012 0448   LABBARB NONE DETECTED 06/04/2012 0448    Ct Head Wo Contrast  06/03/2012  *RADIOLOGY REPORT*  Clinical Data: Right facial and tongue numbness.  Nausea and headache.  CT HEAD WITHOUT CONTRAST  Technique:  Contiguous axial images were obtained from the base of the skull through the vertex without contrast.  Comparison: None.  Findings: The brain stem, cerebellum, cerebral peduncles, thalami, basal ganglia, basilar cisterns, and ventricular system appear unremarkable.  At least partially empty sella noted. No intracranial hemorrhage, mass lesion, or acute infarction is identified.  IMPRESSION:  1.  Empty sella or partially empty sella. 2.   Otherwise, no significant abnormality identified.   Original Report Authenticated By: Gaylyn Rong, M.D.    Mri Brain Without  Contrast  06/04/2012  *RADIOLOGY REPORT*  Clinical Data:  Slurred speech and left facial numbness.  Headache. History of lupus.  MRI HEAD WITHOUT CONTRAST MRA HEAD WITHOUT CONTRAST  Technique:  Multiplanar, multiecho pulse sequences of the brain and surrounding structures were obtained without intravenous contrast. Angiographic images of the head were obtained using MRA technique without contrast.  Comparison:  CT head 06/03/2012.  MRI HEAD  Findings:  Multiple areas of restricted diffusion affect the left posterior frontal and parietal cortex as well as the associated subcortical and periventricular white matter consistent with acute infarction.  There is no associated hemorrhage or mass effect.  The ventricles are normal in size and midline.  Slight atrophy is present.  Moderate chronic microvascular ischemic change is noted, nonspecific in nature.  No large vessel infarct.  Marked empty sella.  No tonsillar herniation.  Negative osseous structures. Mild chronic sinus disease.  IMPRESSION: Multifocal areas of acute infarction affect the left posterior frontal, and parietal cortex as well as the associated subcortical white matter.  No hemorrhage.  Chronic white matter changes as described.  MRA HEAD  Findings: The right carotid system is normal.  The left carotid system is normal.  The basilar artery shows mild proximal irregularity without flow limiting stenosis.  Vertebrals are codominant without stenosis.  Unremarkable right ACA and MCA.  Left MCA is abnormal demonstrating a 50-75% stenosis proximal M1 segment and a 75-90% stenosis in the distal M1 segment affecting the trifurcation branches. Only one of the normal three left MCA M3 branches displays distal flow related enhancement, and that vessel is reduced in size and intensity.  The right PCA is diffusely diseased with no significant abnormality of the left PCA.  There is no cerebellar branch occlusion. There is no visible intracranial aneurysm.   IMPRESSION: Significant narrowing involving the proximal and distal left middle cerebral artery and its major branches with decreased number of visualized proximal M2 and M3 vessels. Similar diffuse changes distal right PCA.  Atherosclerosis versus lupus vasculitis are considerations.  The findings likely represent a hemodynamically significant left MCA intracranial stenosis.  Formal catheter angiogram may be warranted.   Original Report Authenticated By: Davonna Belling, M.D.    Mr Mra Head/brain Wo Cm  06/04/2012  *RADIOLOGY REPORT*  Clinical Data:  Slurred speech and left facial numbness.  Headache. History of lupus.  MRI HEAD WITHOUT CONTRAST MRA HEAD WITHOUT CONTRAST  Technique:  Multiplanar, multiecho pulse sequences of the brain and surrounding structures were obtained without intravenous contrast. Angiographic images of the head were obtained using MRA technique without contrast.  Comparison:  CT head  06/03/2012.  MRI HEAD  Findings:  Multiple areas of restricted diffusion affect the left posterior frontal and parietal cortex as well as the associated subcortical and periventricular white matter consistent with acute infarction.  There is no associated hemorrhage or mass effect.  The ventricles are normal in size and midline.  Slight atrophy is present.  Moderate chronic microvascular ischemic change is noted, nonspecific in nature.  No large vessel infarct.  Marked empty sella.  No tonsillar herniation.  Negative osseous structures. Mild chronic sinus disease.  IMPRESSION: Multifocal areas of acute infarction affect the left posterior frontal, and parietal cortex as well as the associated subcortical white matter.  No hemorrhage.  Chronic white matter changes as described.  MRA HEAD  Findings: The right carotid system is normal.  The left carotid system is normal.  The basilar artery shows mild proximal irregularity without flow limiting stenosis.  Vertebrals are codominant without stenosis.  Unremarkable  right ACA and MCA.  Left MCA is abnormal demonstrating a 50-75% stenosis proximal M1 segment and a 75-90% stenosis in the distal M1 segment affecting the trifurcation branches. Only one of the normal three left MCA M3 branches displays distal flow related enhancement, and that vessel is reduced in size and intensity.  The right PCA is diffusely diseased with no significant abnormality of the left PCA.  There is no cerebellar branch occlusion. There is no visible intracranial aneurysm.  IMPRESSION: Significant narrowing involving the proximal and distal left middle cerebral artery and its major branches with decreased number of visualized proximal M2 and M3 vessels. Similar diffuse changes distal right PCA.  Atherosclerosis versus lupus vasculitis are considerations.  The findings likely represent a hemodynamically significant left MCA intracranial stenosis.  Formal catheter angiogram may be warranted.   Original Report Authenticated By: Davonna Belling, M.D.    2D Echocardiogram  EF 65%, no wall motion abnormalities, atrium normal size  Carotid Doppler  Bilateral: No hemodynamically significat ICA stenosis. Vertebral artery flow is antegrade.  Therapy Recommendations no physical therapy follow up recommended   Physical Exam   GENERAL EXAM: Patient is in no distress  CARDIOVASCULAR: Regular rate and rhythm, no murmurs, no carotid bruits  NEUROLOGIC: MENTAL STATUS: awake, alert, language fluent, comprehension intact, naming intact CRANIAL NERVE: pupils equal and reactive to light, visual fields full to confrontation, extraocular muscles intact, no nystagmus, facial sensation and strength symmetric, uvula midline, shoulder shrug symmetric, tongue midline. MOTOR: normal bulk and tone, full strength in the BUE, LLE; SUBTLE RLE WEAKNESS (4+/5). SENSORY: normal and symmetric to light touch, pinprick, temperature, vibration and proprioception COORDINATION: finger-nose-finger, fine finger movements,  heel-shin normal REFLEXES: deep tendon reflexes present and symmetric GAIT/STATION: narrow based gait; able to walk on toes, heels and tandem; romberg is negative     ASSESSMENT [name] [name] is a 52 y.o. female presenting with transient speech abnormalities and left facial numbness. Imaging confirms a left posterior frontal and parietal cortex infarct. Infarct felt to be embolic, workup underway.  On no anticoagulants  prior to admission. Now on aspirin 325 mg orally every day for secondary stroke prevention. Patient with resultant headache.   Hypertension; goal 130/80  Lupus  Rheumatoid arthritis  Hyperlipidemia, LDL 144; goal < 70 in diabetic patients  Diabetes mellitus hgb a1c 7.3; goal < 7.0  Hospital day # 1  TREATMENT/PLAN  Continue aspirin 325 mg orally every day for secondary stroke prevention.  Risk factor modification  Statin added  Diabetes diagnosed and addressed  Hypertension addressed.  Patient to make appt with primary MD (at this time Dr. Parke Simmers) for intensive management of risk factors.  She is to continue routine management of her Lupus at [facility]. She agrees to make appt to be seen soon so that she can discuss this hospitalization with her physician and further care plans involving Lupus treatment.  Please have patient follow up with neurology in 2 months.  Gwendolyn Lima. Manson Passey, [employer] [facility] [employer], MBA, MHA [facility] Pager: [phone] 06/04/2012 2:10 PM  I have personally obtained a history, examined the patient, evaluated imaging results, and formulated the assessment and plan of care. I agree with the above. Left posterior frontal and parietal ischemic infarcts, likely related to intracranial atherosclerosis and artery-artery embolism. Recommend medical management (aspirin, BP, lipid, diabetes control). Given intracranial atherosclerosis > 70%, could consider 3 months of aspirin 81mg  + plavix 75mg  daily. Needs follow up of lupus as well.   Suanne Marker, MD 06/04/2012, 9:09 PM Certified in Neurology, Neurophysiology and Neuroimaging [facility] - Stroke Team  Please refer to [email or web address] for on-call Stroke MD

## 2012-06-05 ENCOUNTER — Other Ambulatory Visit: Payer: Self-pay

## 2012-06-05 LAB — GLUCOSE, CAPILLARY
Glucose-Capillary: 184 mg/dL — ABNORMAL HIGH (ref 70–99)
Glucose-Capillary: 88 mg/dL (ref 70–99)

## 2012-06-05 MED ORDER — ASPIRIN 81 MG PO TABS: 162.0000 mg | ORAL_TABLET | Freq: Every day | ORAL | Status: DC

## 2012-06-05 MED ORDER — METFORMIN HCL 500 MG PO TABS: 500.0000 mg | ORAL_TABLET | Freq: Two times a day (BID) | ORAL | Status: DC

## 2012-06-05 MED ORDER — PRAVASTATIN SODIUM 40 MG PO TABS: 40.0000 mg | ORAL_TABLET | Freq: Every day | ORAL | Status: DC

## 2012-06-05 MED ORDER — LISINOPRIL 10 MG PO TABS: 10.0000 mg | ORAL_TABLET | Freq: Every day | ORAL | Status: DC

## 2012-06-05 MED ORDER — PNEUMOCOCCAL VAC POLYVALENT 25 MCG/0.5ML IJ INJ: 0.5000 mL | INJECTION | INTRAMUSCULAR | Status: DC

## 2012-06-05 MED ORDER — INFLUENZA VIRUS VACC SPLIT PF IM SUSP: 0.5000 mL | INTRAMUSCULAR | Status: DC

## 2012-06-05 NOTE — Progress Notes (Signed)
Stroke Team Progress Note  HISTORY  [name] [name] is a 52 y.o. female with a history of hypertension, lupus and rheumatoid arthritis who came to the emergency room following an episode of transient speech abnormality and numbness involving left side of her face. She had a headache at the time as well. Symptoms lasted about 20 minutes. Speech was described as gibberish. Patient had a similar spell in November 2013 but did not seek medical attention. She has not been on antiplatelet therapy. CT scan of the head showed no acute intracranial abnormality. NIH score at the time of this evaluation was 0.   LSN: 1230  tPA Given: No: Symptoms resolved  MRankin: 0  Patient was not a TPA candidate secondary to resolution of symptoms. She was admitted to the neuro floor for further evaluation and treatment.  SUBJECTIVE No family members present this morning. The patient states that she feels well. She had an episode of dysarthria while talking on the phone last evening that lasted 5-10 minutes. Dr. Roseanne Reno was contacted and a CT scan was ordered. The episode resolved and she has had no further symptoms. We discussed smoking cessation.  OBJECTIVE Most recent Vital Signs: Filed Vitals:   06/04/12 1718 06/04/12 2200 06/05/12 0113 06/05/12 0542  BP: 136/79 145/68 140/85 150/83  Pulse: 87 81 83 79  Temp: 98 F (36.7 C) 98.1 F (36.7 C) 98.2 F (36.8 C) 98 F (36.7 C)  TempSrc: Oral Oral Oral Oral  Resp: 18 18 16 16   Height:      Weight:      SpO2: 100% 100% 100% 98%   CBG (last 3)   Recent Labs  06/04/12 1723 06/04/12 2123 06/05/12 0634  GLUCAP 219* 128* 184*    IV Fluid Intake:     MEDICATIONS  . aspirin  325 mg Oral Daily  . atorvastatin  40 mg Oral q1800  . heparin  5,000 Units Subcutaneous Q8H  . insulin glargine  10 Units Subcutaneous QHS  . lisinopril  10 mg Oral Daily  . tamsulosin  0.4 mg Oral BID   PRN:    Diet:  Cardiac thin liquids Activity:  Bathroom privileges with  assistance DVT Prophylaxis:  Heparin 5000u sq q 8 hours  CLINICALLY SIGNIFICANT STUDIES Basic Metabolic Panel:   Recent Labs Lab 06/03/12 1534 06/03/12 1955  NA 136  --   K 4.8  --   CL 103  --   CO2 20  --   GLUCOSE 84  --   BUN 13  --   CREATININE 0.67 0.74  CALCIUM 9.5  --    Liver Function Tests:   Recent Labs Lab 06/03/12 1534  AST 30  ALT 18  ALKPHOS 94  BILITOT 0.4  PROT 7.8  ALBUMIN 3.5   CBC:   Recent Labs Lab 06/03/12 1534 06/03/12 1955  WBC 8.5 9.4  NEUTROABS 3.8  --   HGB 16.0* 15.7*  HCT 46.1* 46.2*  MCV 85.2 87.5  PLT 272 301   Coagulation:   Recent Labs Lab 06/03/12 1854  LABPROT 13.5  INR 1.04   Lipid Panel    Component Value Date/Time   CHOL 258* 06/04/2012 0630   TRIG 348* 06/04/2012 0630   HDL 44 06/04/2012 0630   CHOLHDL 5.9 06/04/2012 0630   VLDL 70* 06/04/2012 0630   LDLCALC 144* 06/04/2012 0630   HgbA1C  Lab Results  Component Value Date   HGBA1C 7.3* 06/03/2012    Urine Drug Screen:  Component Value Date/Time   LABOPIA NONE DETECTED 06/04/2012 0448   COCAINSCRNUR NONE DETECTED 06/04/2012 0448   LABBENZ NONE DETECTED 06/04/2012 0448   AMPHETMU NONE DETECTED 06/04/2012 0448   THCU NONE DETECTED 06/04/2012 0448   LABBARB NONE DETECTED 06/04/2012 0448    Ct Head Wo Contrast 06/03/12  IMPRESSION:  1.  Empty sella or partially empty sella. 2.   Otherwise, no significant abnormality identified.   CT Head 06/04/2012 IMPRESSION:  No CT evidence of acute intracranial abnormality. The tiny  infarctions identified on recent MR are not well visualized on this  study.  MRI HEAD WITHOUT CONTRAST  06/04/12 IMPRESSION: Multifocal areas of acute infarction affect the left posterior frontal, and parietal cortex as well as the associated subcortical white matter.  No hemorrhage.  Chronic white matter changes as described.    MRA HEAD  06/04/12 The basilar artery shows mild proximal irregularity without flow limiting stenosis.   Vertebrals are codominant without stenosis.  Unremarkable right ACA and MCA.  Left MCA is abnormal demonstrating a 50-75% stenosis proximal M1 segment and a 75-90% stenosis in the distal M1 segment affecting the trifurcation branches. Only one of the normal three left MCA M3 branches displays distal flow related enhancement, and that vessel is reduced in size and intensity.  The right PCA is diffusely diseased with no significant abnormality of the left PCA.  There is no cerebellar branch occlusion. There is no visible intracranial aneurysm.  IMPRESSION: Significant narrowing involving the proximal and distal left middle cerebral artery and its major branches with decreased number of visualized proximal M2 and M3 vessels. Similar diffuse changes distal right PCA.  Atherosclerosis versus lupus vasculitis are considerations.  The findings likely represent a hemodynamically significant left MCA intracranial stenosis.  Formal catheter angiogram may be warranted.     2D Echocardiogram  EF 65%, no wall motion abnormalities, atrium normal size  Carotid Doppler  Bilateral: No hemodynamically significat ICA stenosis. Vertebral artery flow is antegrade.  Therapy Recommendations no physical therapy follow up recommended   Physical Exam   General - pleasant 52 year old female in bed in no acute distress. Heart - Regular rate and rhythm - no murmer Lungs - Clear to auscultation Extremities - Distal pulses weak but intact - trace edema bilaterally. Skin - Warm and dry  NEUROLOGIC: MENTAL STATUS: awake, alert, language fluent, comprehension intact CRANIAL NERVE: pupils equal and reactive to light, extraocular muscles intact, no nystagmus, facial sensation and strength symmetric, uvula midline, tongue midline. MOTOR: normal bulk and tone, full strength in the BUE, LLE; SUBTLE RLE WEAKNESS (4+/5). SENSORY: normal and symmetric to light touch COORDINATION: finger-nose-finger, fine finger  movements  ASSESSMENT [name] is a 52 y.o. female presenting with transient speech abnormalities and left facial numbness. Imaging confirms a left posterior frontal and parietal cortex infarct. Infarct felt to be embolic, workup underway.  On no anticoagulants  prior to admission. Now on aspirin 325 mg orally every day for secondary stroke prevention. Patient with resultant headache. She had a brief episode of dysarthria last evening. A CT of the head was unchanged.   Hypertension; goal 130/80  Lupus  Rheumatoid arthritis  Hyperlipidemia, LDL 144; goal < 70 in diabetic patients  Diabetes mellitus hgb a1c 7.3; goal < 7.0  Tobacco history  Obesity  Hospital day # 2  TREATMENT/PLAN  Continue aspirin 325 mg orally every day for secondary stroke prevention. Consider adding Plavix and decreasing aspirin to 81 mg daily secondary to recurrent symptoms of  dysarthria.  Risk factor modification  Statin added  Diabetes diagnosed and addressed  Hypertension addressed.  Patient to make appt with primary MD (at this time Dr. Parke Simmers) for intensive management of risk factors.  She is to continue routine management of her Lupus at [facility]. She agrees to make appt to be seen soon so that she can discuss this hospitalization with her physician and further care plans involving Lupus treatment.  Please have patient follow up with neurology in 2 months.  Smoking cessation discussed.  Delton See [name] [facility] Pager [phone] 06/05/2012, 8:58 AM  I have evaluated imaging results, and formulated the assessment and plan of care. I agree with the above. Patient was discharged before I could see her today. Left posterior frontal and parietal ischemic infarcts, likely related to intracranial atherosclerosis and artery-artery embolism. Recommend medical management (aspirin, BP, lipid, diabetes control). Given intracranial atherosclerosis > 70%, could consider 3 months  of aspirin 81mg  + plavix 75mg  daily. Needs follow up of lupus as well.  Suanne Marker, MD 06/05/2012, 2:25 PM Certified in Neurology, Neurophysiology and Neuroimaging [facility] - Stroke Team  Please refer to [email or web address] for on-call Stroke MD

## 2012-06-05 NOTE — Discharge Summary (Signed)
Physician Discharge Summary  [name] [name] XBJ:[id number] DOB: [birth year] DOA: 06/03/2012  PCP: Geraldo Pitter, MD  Admit date: 06/03/2012 Discharge date: 06/05/2012  Time spent: 30 minutes  Recommendations for Outpatient Follow-up:  1. Follow up with PCP titrate diabetes medications.  Discharge Diagnoses:  Principal Problem:   Type II or unspecified type diabetes mellitus with neurological manifestations, not stated as uncontrolled(250.60) Active Problems:   CVA (cerebral infarction)   Lupus   Hypertension   Rheumatoid arthritis   Obesity (BMI 30.0-34.9)   Discharge Condition: stable  Diet recommendation: low carb diet.  Filed Weights   06/04/12 0307  Weight: 99.428 kg (219 lb 3.2 oz)    History of present illness:  52 y.o. female with known medical issues and followed by [facility] primarily, presente dot [facility] ed 3/20 after being at [facility] this afternoon. As she was pulling into her driveway she had a sensation that her mouth was going numb, then she felt like she was talking but was talking "gibberish" and not making any sense-Her daughter-in-law was sitting with her in the car at the time.  This had happened before around Thanksgiving 201-she didn't present for medical therpay at the time. NO physical symptoms accompanied her findings, no Seizure like acitvity, no LOC, No Bowel or urinaery incontinence  Was able to walk out of the truck and felt a little nauseous  She recently presented to [facility] with an abcess on 05/27/2012 and is still on antibiotics for this  She is a tangential historian and tells me that she's been diagnosed with rheumatoid arthritis in 2008 and has also been told that she might have lupus. She goes on to relate that she does not have regular medical care and is in the process of getting her disability. Her disability diagnosis would be rheumatoid arthritis.  She relates that she had 2 vaginal deliveries and that after her second delivery 9010 months later she had a  complete hysterectomy because of heavy bleeding and fibroids, making the diagnosis of Sheehan syndrome little less likely  Basic metabolic panel was completely normal  Hemoglobin 16.0 with hematocrit of 46.1 indicating potential heme concentration otherwise labs were normal  CT of the head showed empty sella partially empty sella   Hospital Course:  CVA (cerebral infarction):  - MRI 3.21.2014 as below, acute stroke.  - Carotid doppler no ICA stenosis.  - ehco no AS or thrombus. - ASA, statins.  - HBgA1c high, metformin as an outpatient.   DM II:  - treat with lantus and SSI while in house.  - metformin as an outpatient.   Lupus:  - stable, follow up with PCP.   Hypertension:  - High start lisinopril.   Obesity (BMI 30.0-34.9)  - counseling   Procedures:  ECHO  MRI  Carotid doppler  Consultations:  neurology  Discharge Exam: Filed Vitals:   06/04/12 1718 06/04/12 2200 06/05/12 0113 06/05/12 0542  BP: 136/79 145/68 140/85 150/83  Pulse: 87 81 83 79  Temp: 98 F (36.7 C) 98.1 F (36.7 C) 98.2 F (36.8 C) 98 F (36.7 C)  TempSrc: Oral Oral Oral Oral  Resp: 18 18 16 16   Height:      Weight:      SpO2: 100% 100% 100% 98%    General: A&O x3 Cardiovascular: RRR Respiratory: good air movement CTA B/L  Discharge Instructions  Discharge Orders   Future Orders Complete By Expires     Diet - low sodium heart healthy  As directed  Increase activity slowly  As directed         Medication List    STOP taking these medications       sulfamethoxazole-trimethoprim 800-160 MG per tablet  Commonly known as:  SEPTRA DS     tamsulosin 0.4 MG Caps  Commonly known as:  FLOMAX      TAKE these medications       aspirin 81 MG tablet  Take 2 tablets (162 mg total) by mouth daily.     lisinopril 10 MG tablet  Commonly known as:  PRINIVIL,ZESTRIL  Take 1 tablet (10 mg total) by mouth daily.     metFORMIN 500 MG tablet  Commonly known as:  GLUCOPHAGE   Take 1 tablet (500 mg total) by mouth 2 (two) times daily with a meal.     pravastatin 40 MG tablet  Commonly known as:  PRAVACHOL  Take 1 tablet (40 mg total) by mouth daily.     TYLENOL ARTHRITIS PAIN 650 MG CR tablet  Generic drug:  acetaminophen  Take 1,300 mg by mouth every 8 (eight) hours as needed. For pain           Follow-up Information   Follow up with Geraldo Pitter, MD In 2 weeks. (hospital follow up)    Contact information:   [address] [place] [address] [phone]        The results of significant diagnostics from this hospitalization (including imaging, microbiology, ancillary and laboratory) are listed below for reference.    Significant Diagnostic Studies: Ct Head Wo Contrast  06/04/2012  *RADIOLOGY REPORT*  Clinical Data: Acute left CVA.  Slurred speech and facial numbness  CT HEAD WITHOUT CONTRAST  Technique:  Contiguous axial images were obtained from the base of the skull through the vertex without contrast.  Comparison: 06/03/2012 CT and MRI  Findings: No acute intracranial abnormalities are identified, including mass lesion or mass effect, hydrocephalus, extra-axial fluid collection, midline shift, hemorrhage, or acute infarction.  The visualized bony calvarium is unremarkable.  The tiny left-sided infarctions identified on MR are difficult to visualize on this study.  IMPRESSION: No CT evidence of acute intracranial abnormality.  The tiny infarctions identified on recent MR are not well visualized on this study.   Original Report Authenticated By: Harmon Pier, M.D.    Ct Head Wo Contrast  06/03/2012  *RADIOLOGY REPORT*  Clinical Data: Right facial and tongue numbness.  Nausea and headache.  CT HEAD WITHOUT CONTRAST  Technique:  Contiguous axial images were obtained from the base of the skull through the vertex without contrast.  Comparison: None.  Findings: The brain stem, cerebellum, cerebral peduncles, thalami, basal ganglia, basilar  cisterns, and ventricular system appear unremarkable.  At least partially empty sella noted. No intracranial hemorrhage, mass lesion, or acute infarction is identified.  IMPRESSION:  1.  Empty sella or partially empty sella. 2.   Otherwise, no significant abnormality identified.   Original Report Authenticated By: Gaylyn Rong, M.D.    Mri Brain Without Contrast  06/04/2012  *RADIOLOGY REPORT*  Clinical Data:  Slurred speech and left facial numbness.  Headache. History of lupus.  MRI HEAD WITHOUT CONTRAST MRA HEAD WITHOUT CONTRAST  Technique:  Multiplanar, multiecho pulse sequences of the brain and surrounding structures were obtained without intravenous contrast. Angiographic images of the head were obtained using MRA technique without contrast.  Comparison:  CT head 06/03/2012.  MRI HEAD  Findings:  Multiple areas of restricted diffusion affect the left posterior frontal  and parietal cortex as well as the associated subcortical and periventricular white matter consistent with acute infarction.  There is no associated hemorrhage or mass effect.  The ventricles are normal in size and midline.  Slight atrophy is present.  Moderate chronic microvascular ischemic change is noted, nonspecific in nature.  No large vessel infarct.  Marked empty sella.  No tonsillar herniation.  Negative osseous structures. Mild chronic sinus disease.  IMPRESSION: Multifocal areas of acute infarction affect the left posterior frontal, and parietal cortex as well as the associated subcortical white matter.  No hemorrhage.  Chronic white matter changes as described.  MRA HEAD  Findings: The right carotid system is normal.  The left carotid system is normal.  The basilar artery shows mild proximal irregularity without flow limiting stenosis.  Vertebrals are codominant without stenosis.  Unremarkable right ACA and MCA.  Left MCA is abnormal demonstrating a 50-75% stenosis proximal M1 segment and a 75-90% stenosis in the distal M1  segment affecting the trifurcation branches. Only one of the normal three left MCA M3 branches displays distal flow related enhancement, and that vessel is reduced in size and intensity.  The right PCA is diffusely diseased with no significant abnormality of the left PCA.  There is no cerebellar branch occlusion. There is no visible intracranial aneurysm.  IMPRESSION: Significant narrowing involving the proximal and distal left middle cerebral artery and its major branches with decreased number of visualized proximal M2 and M3 vessels. Similar diffuse changes distal right PCA.  Atherosclerosis versus lupus vasculitis are considerations.  The findings likely represent a hemodynamically significant left MCA intracranial stenosis.  Formal catheter angiogram may be warranted.   Original Report Authenticated By: Davonna Belling, M.D.    Mr Mra Head/brain Wo Cm  06/04/2012  *RADIOLOGY REPORT*  Clinical Data:  Slurred speech and left facial numbness.  Headache. History of lupus.  MRI HEAD WITHOUT CONTRAST MRA HEAD WITHOUT CONTRAST  Technique:  Multiplanar, multiecho pulse sequences of the brain and surrounding structures were obtained without intravenous contrast. Angiographic images of the head were obtained using MRA technique without contrast.  Comparison:  CT head 06/03/2012.  MRI HEAD  Findings:  Multiple areas of restricted diffusion affect the left posterior frontal and parietal cortex as well as the associated subcortical and periventricular white matter consistent with acute infarction.  There is no associated hemorrhage or mass effect.  The ventricles are normal in size and midline.  Slight atrophy is present.  Moderate chronic microvascular ischemic change is noted, nonspecific in nature.  No large vessel infarct.  Marked empty sella.  No tonsillar herniation.  Negative osseous structures. Mild chronic sinus disease.  IMPRESSION: Multifocal areas of acute infarction affect the left posterior frontal, and parietal  cortex as well as the associated subcortical white matter.  No hemorrhage.  Chronic white matter changes as described.  MRA HEAD  Findings: The right carotid system is normal.  The left carotid system is normal.  The basilar artery shows mild proximal irregularity without flow limiting stenosis.  Vertebrals are codominant without stenosis.  Unremarkable right ACA and MCA.  Left MCA is abnormal demonstrating a 50-75% stenosis proximal M1 segment and a 75-90% stenosis in the distal M1 segment affecting the trifurcation branches. Only one of the normal three left MCA M3 branches displays distal flow related enhancement, and that vessel is reduced in size and intensity.  The right PCA is diffusely diseased with no significant abnormality of the left PCA.  There is no cerebellar branch  occlusion. There is no visible intracranial aneurysm.  IMPRESSION: Significant narrowing involving the proximal and distal left middle cerebral artery and its major branches with decreased number of visualized proximal M2 and M3 vessels. Similar diffuse changes distal right PCA.  Atherosclerosis versus lupus vasculitis are considerations.  The findings likely represent a hemodynamically significant left MCA intracranial stenosis.  Formal catheter angiogram may be warranted.   Original Report Authenticated By: Davonna Belling, M.D.     Microbiology: Recent Results (from the past 240 hour(s))  CULTURE, ROUTINE-ABSCESS     Status: None   Collection Time    05/27/12 11:47 AM      Result Value Range Status   Specimen Description ABSCESS AXILLA RIGHT   Final   Special Requests NONE   Final   Gram Stain     Final   Value: MODERATE WBC PRESENT,BOTH PMN AND MONONUCLEAR     FEW SQUAMOUS EPITHELIAL CELLS PRESENT     FEW GRAM POSITIVE COCCI IN CLUSTERS     IN PAIRS   Culture     Final   Value: MODERATE STAPHYLOCOCCUS SPECIES (COAGULASE NEGATIVE)     Note: RIFAMPIN AND GENTAMICIN SHOULD NOT BE USED AS SINGLE DRUGS FOR TREATMENT OF STAPH  INFECTIONS.   Report Status 05/30/2012 FINAL   Final   Organism ID, Bacteria STAPHYLOCOCCUS SPECIES (COAGULASE NEGATIVE)   Final     Labs: Basic Metabolic Panel:  Recent Labs Lab 06/03/12 1534 06/03/12 1955  NA 136  --   K 4.8  --   CL 103  --   CO2 20  --   GLUCOSE 84  --   BUN 13  --   CREATININE 0.67 0.74  CALCIUM 9.5  --    Liver Function Tests:  Recent Labs Lab 06/03/12 1534  AST 30  ALT 18  ALKPHOS 94  BILITOT 0.4  PROT 7.8  ALBUMIN 3.5   No results found for this basename: LIPASE, AMYLASE,  in the last 168 hours No results found for this basename: AMMONIA,  in the last 168 hours CBC:  Recent Labs Lab 06/03/12 1534 06/03/12 1955  WBC 8.5 9.4  NEUTROABS 3.8  --   HGB 16.0* 15.7*  HCT 46.1* 46.2*  MCV 85.2 87.5  PLT 272 301   Cardiac Enzymes: No results found for this basename: CKTOTAL, CKMB, CKMBINDEX, TROPONINI,  in the last 168 hours BNP: BNP (last 3 results) No results found for this basename: PROBNP,  in the last 8760 hours CBG:  Recent Labs Lab 06/04/12 0646 06/04/12 1118 06/04/12 1723 06/04/12 2123 06/05/12 0634  GLUCAP 154* 113* 219* 128* 184*       Signed:  Marinda Elk  [facility] 06/05/2012, 8:17 AM

## 2012-06-05 NOTE — Progress Notes (Signed)
Patient discharge teaching complete and all paperwork signed.  Prescription information given.  IV removed and telemetry discontinued.  Patient awaiting her ride.  Will continue to monitor until patient leaves.  [name]

## 2012-06-07 LAB — FACTOR 5 LEIDEN

## 2012-06-17 ENCOUNTER — Emergency Department (HOSPITAL_COMMUNITY)
Admission: EM | Admit: 2012-06-17 | Discharge: 2012-06-17 | Disposition: A | Discharge status: Home / Self Care | Payer: Medicare Other | Attending: Emergency Medicine | Admitting: Emergency Medicine

## 2012-06-17 ENCOUNTER — Encounter (HOSPITAL_COMMUNITY): Payer: Self-pay | Admitting: *Deleted

## 2012-06-17 DIAGNOSIS — Z79899 Other long term (current) drug therapy: Secondary | ICD-10-CM | POA: Insufficient documentation

## 2012-06-17 DIAGNOSIS — Z7982 Long term (current) use of aspirin: Secondary | ICD-10-CM | POA: Insufficient documentation

## 2012-06-17 DIAGNOSIS — N23 Unspecified renal colic: Secondary | ICD-10-CM

## 2012-06-17 DIAGNOSIS — Z87442 Personal history of urinary calculi: Secondary | ICD-10-CM | POA: Insufficient documentation

## 2012-06-17 DIAGNOSIS — I10 Essential (primary) hypertension: Secondary | ICD-10-CM | POA: Insufficient documentation

## 2012-06-17 DIAGNOSIS — M129 Arthropathy, unspecified: Secondary | ICD-10-CM | POA: Insufficient documentation

## 2012-06-17 DIAGNOSIS — Z8742 Personal history of other diseases of the female genital tract: Secondary | ICD-10-CM | POA: Insufficient documentation

## 2012-06-17 DIAGNOSIS — E669 Obesity, unspecified: Secondary | ICD-10-CM | POA: Insufficient documentation

## 2012-06-17 DIAGNOSIS — Z90710 Acquired absence of both cervix and uterus: Secondary | ICD-10-CM | POA: Insufficient documentation

## 2012-06-17 DIAGNOSIS — F17200 Nicotine dependence, unspecified, uncomplicated: Secondary | ICD-10-CM | POA: Insufficient documentation

## 2012-06-17 DIAGNOSIS — R110 Nausea: Secondary | ICD-10-CM | POA: Insufficient documentation

## 2012-06-17 DIAGNOSIS — Z862 Personal history of diseases of the blood and blood-forming organs and certain disorders involving the immune mechanism: Secondary | ICD-10-CM | POA: Insufficient documentation

## 2012-06-17 DIAGNOSIS — Z8739 Personal history of other diseases of the musculoskeletal system and connective tissue: Secondary | ICD-10-CM | POA: Insufficient documentation

## 2012-06-17 DIAGNOSIS — Z8619 Personal history of other infectious and parasitic diseases: Secondary | ICD-10-CM | POA: Insufficient documentation

## 2012-06-17 DIAGNOSIS — Z872 Personal history of diseases of the skin and subcutaneous tissue: Secondary | ICD-10-CM | POA: Insufficient documentation

## 2012-06-17 DIAGNOSIS — Z8639 Personal history of other endocrine, nutritional and metabolic disease: Secondary | ICD-10-CM | POA: Insufficient documentation

## 2012-06-17 HISTORY — DX: Unspecified ovarian cyst, unspecified side: N83.209

## 2012-06-17 LAB — CBC WITH DIFFERENTIAL/PLATELET
Basophils Absolute: 0 10*3/uL (ref 0.0–0.1)
Basophils Relative: 0 % (ref 0–1)
Eosinophils Absolute: 0.4 10*3/uL (ref 0.0–0.7)
Eosinophils Relative: 4 % (ref 0–5)
HCT: 44.5 % (ref 36.0–46.0)
Hemoglobin: 15.5 g/dL — ABNORMAL HIGH (ref 12.0–15.0)
Lymphocytes Relative: 39 % (ref 12–46)
Lymphs Abs: 3.7 10*3/uL (ref 0.7–4.0)
MCH: 30 pg (ref 26.0–34.0)
MCHC: 34.8 g/dL (ref 30.0–36.0)
MCV: 86.2 fL (ref 78.0–100.0)
Monocytes Absolute: 0.5 10*3/uL (ref 0.1–1.0)
Monocytes Relative: 6 % (ref 3–12)
Neutro Abs: 4.7 10*3/uL (ref 1.7–7.7)
Neutrophils Relative %: 50 % (ref 43–77)
Platelets: 328 10*3/uL (ref 150–400)
RBC: 5.16 MIL/uL — ABNORMAL HIGH (ref 3.87–5.11)
RDW: 14.9 % (ref 11.5–15.5)
WBC: 9.5 10*3/uL (ref 4.0–10.5)

## 2012-06-17 LAB — URINALYSIS, MICROSCOPIC ONLY
Bilirubin Urine: NEGATIVE
Glucose, UA: NEGATIVE mg/dL
Ketones, ur: NEGATIVE mg/dL
Leukocytes, UA: NEGATIVE
Nitrite: NEGATIVE
Protein, ur: 30 mg/dL — AB
Specific Gravity, Urine: 1.024 (ref 1.005–1.030)
Urobilinogen, UA: 1 mg/dL (ref 0.0–1.0)
pH: 5 (ref 5.0–8.0)

## 2012-06-17 LAB — COMPREHENSIVE METABOLIC PANEL
ALT: 18 U/L (ref 0–35)
AST: 19 U/L (ref 0–37)
Albumin: 3.7 g/dL (ref 3.5–5.2)
Alkaline Phosphatase: 96 U/L (ref 39–117)
BUN: 13 mg/dL (ref 6–23)
CO2: 23 mEq/L (ref 19–32)
Calcium: 9.8 mg/dL (ref 8.4–10.5)
Chloride: 105 mEq/L (ref 96–112)
Creatinine, Ser: 0.89 mg/dL (ref 0.50–1.10)
GFR calc Af Amer: 84 mL/min — ABNORMAL LOW (ref >90)
GFR calc non Af Amer: 73 mL/min — ABNORMAL LOW (ref >90)
Glucose, Bld: 111 mg/dL — ABNORMAL HIGH (ref 70–99)
Potassium: 4 mEq/L (ref 3.5–5.1)
Sodium: 140 mEq/L (ref 135–145)
Total Bilirubin: 0.3 mg/dL (ref 0.3–1.2)
Total Protein: 8 g/dL (ref 6.0–8.3)

## 2012-06-17 LAB — LIPASE, BLOOD: Lipase: 27 U/L (ref 11–59)

## 2012-06-17 MED ORDER — HYDROCODONE-ACETAMINOPHEN 5-325 MG PO TABS: 2.0000 | ORAL_TABLET | Freq: Four times a day (QID) | ORAL | Status: DC | PRN

## 2012-06-17 MED ORDER — HYDROCODONE-ACETAMINOPHEN 5-325 MG PO TABS
2.0000 | ORAL_TABLET | Freq: Once | ORAL | Status: AC
  Administered 2012-06-17: 2 via ORAL
  Filled 2012-06-17: qty 2

## 2012-06-17 NOTE — ED Provider Notes (Signed)
History     CSN: [id number]  Arrival date & time 06/17/12  1844   First MD Initiated Contact with Patient 06/17/12 2007      Chief Complaint  Patient presents with  . Abdominal Pain    (Consider location/radiation/quality/duration/timing/severity/associated sxs/prior treatment) Patient is a 52 y.o. female presenting with abdominal pain.  Abdominal Pain  Pt reports moderate aching pain in L flank, gradually moving to LLQ, associated with nausea but no vomiting. Similar to previous kidney stone pain. She has history of ovarian cysts but this pain is different. Recently admission for multiple problems primary stroke, taking ASA. No issues related to that.   Past Medical History  Diagnosis Date  . Arthritis   . Hypertension   . Lupus   . Rheumatoid aortitis   . Empty sella   . Hidradenitis axillaris   . Obesity (BMI 30.0-34.9)   . Hepatitis C   . Renal disorder     kidney stones  . Ovarian cyst     Past Surgical History  Procedure Laterality Date  . Abdominal hysterectomy    . Breast biopsy      Family History  Problem Relation Age of Onset  . Diabetes Mother   . Stroke Maternal Aunt     History  Substance Use Topics  . Smoking status: Current Every Day Smoker -- 0.50 packs/day    Types: Cigarettes  . Smokeless tobacco: Not on file  . Alcohol Use: No    OB History   Grav Para Term Preterm Abortions TAB SAB Ect Mult Living                  Review of Systems  Gastrointestinal: Positive for abdominal pain.   All other systems reviewed and are negative except as noted in HPI.   Allergies  Review of patient's allergies indicates no known allergies.  Home Medications   Current Outpatient Rx  Name  Route  Sig  Dispense  Refill  . acetaminophen (TYLENOL ARTHRITIS PAIN) 650 MG CR tablet   Oral   Take 1,300 mg by mouth every 8 (eight) hours as needed for pain. For pain         . aspirin 81 MG tablet   Oral   Take 81 mg by mouth 2 (two) times daily.        Marland Kitchen lisinopril (PRINIVIL,ZESTRIL) 10 MG tablet   Oral   Take 1 tablet (10 mg total) by mouth daily.   30 tablet   0   . metFORMIN (GLUCOPHAGE) 500 MG tablet   Oral   Take 1 tablet (500 mg total) by mouth 2 (two) times daily with a meal.   30 tablet   0   . pravastatin (PRAVACHOL) 40 MG tablet   Oral   Take 40 mg by mouth at bedtime.           BP 150/91  Pulse 75  Temp(Src) 97.7 F (36.5 C) (Oral)  Resp 20  SpO2 99%  Physical Exam  Nursing note and vitals reviewed. Constitutional: She is oriented to person, place, and time. She appears well-developed and well-nourished.  HENT:  Head: Normocephalic and atraumatic.  Eyes: EOM are normal. Pupils are equal, round, and reactive to light.  Neck: Normal range of motion. Neck supple.  Cardiovascular: Normal rate, normal heart sounds and intact distal pulses.   Pulmonary/Chest: Effort normal and breath sounds normal.  Abdominal: Bowel sounds are normal. She exhibits no distension. There is no tenderness. There is no  rebound and no guarding.  Musculoskeletal: Normal range of motion. She exhibits no edema and no tenderness.  Neurological: She is alert and oriented to person, place, and time. She has normal strength. No cranial nerve deficit or sensory deficit.  Skin: Skin is warm and dry. No rash noted.  Psychiatric: She has a normal mood and affect.    ED Course  Procedures (including critical care time)  Labs Reviewed  CBC WITH DIFFERENTIAL - Abnormal; Notable for the following:    RBC 5.16 (*)    Hemoglobin 15.5 (*)    All other components within normal limits  COMPREHENSIVE METABOLIC PANEL - Abnormal; Notable for the following:    Glucose, Bld 111 (*)    GFR calc non Af Amer 73 (*)    GFR calc Af Amer 84 (*)    All other components within normal limits  URINALYSIS, MICROSCOPIC ONLY - Abnormal; Notable for the following:    APPearance CLOUDY (*)    Hgb urine dipstick LARGE (*)    Protein, ur 30 (*)     Squamous Epithelial / LPF FEW (*)    All other components within normal limits  LIPASE, BLOOD   No results found.   No diagnosis found.    MDM  Bloodwork unremarkable. She has hematuria, no signs of infection. Discussed imaging with the patient, but given her history of stones and symptoms consistent with renal colic with hold off for now. Pain meds, fluids at home. Urology follow up if does not improve.         Charles B. Bernette Mayers, MD 06/17/12 2056

## 2012-06-17 NOTE — ED Notes (Signed)
Pt with L lower back pain last night that has radiated to LLQ.   Hx of kidney stones and ovarian cysts, but states this feels like the last time she had a kidney stone.  C/o dark cloudy urine and denies vag discharge.

## 2012-07-06 ENCOUNTER — Ambulatory Visit (INDEPENDENT_AMBULATORY_CARE_PROVIDER_SITE_OTHER): Payer: Medicare Other | Admitting: Diagnostic Neuroimaging

## 2012-07-06 ENCOUNTER — Encounter: Payer: Self-pay | Admitting: Diagnostic Neuroimaging

## 2012-07-06 VITALS — BP 182/102 | HR 88 | Temp 97.8°F | Ht 67.0 in | Wt 224.0 lb

## 2012-07-06 DIAGNOSIS — I6350 Cerebral infarction due to unspecified occlusion or stenosis of unspecified cerebral artery: Secondary | ICD-10-CM

## 2012-07-06 DIAGNOSIS — I639 Cerebral infarction, unspecified: Secondary | ICD-10-CM

## 2012-07-06 MED ORDER — CLOPIDOGREL BISULFATE 75 MG PO TABS: 75.0000 mg | ORAL_TABLET | Freq: Every day | ORAL | Status: DC

## 2012-07-06 MED ORDER — ASPIRIN 81 MG PO TABS: 81.0000 mg | ORAL_TABLET | Freq: Every day | ORAL | Status: DC

## 2012-07-06 NOTE — Progress Notes (Signed)
[facility]  PATIENT: [name] [name] DOB: [birth year]  REFERRING CLINICIAN: [facility] / [facility] HISTORY FROM: patient REASON FOR VISIT: hospital follow up   HISTORICAL  CHIEF COMPLAINT:  Chief Complaint  Patient presents with  . Cerebrovascular Accident    HISTORY OF PRESENT ILLNESS:   52 y.o. female with a history of hypertension, lupus and rheumatoid arthritis who came to the emergency room following an episode of transient speech abnormality and numbness involving left side of her face. She had a headache at the time as well. Symptoms lasted about 20 minutes. Speech was described as gibberish. Patient had a similar spell in November 2013 but did not seek medical attention. She has not been on antiplatelet therapy. CT scan of the head showed no acute intracranial abnormality. NIH score at the time of this evaluation was 0.  Since hospital discharge, doing well. Some intermittent,    REVIEW OF SYSTEMS: Full 14 system review of systems performed and notable only for fatigue chest pain swelling in legs blurred vision eye pain joint pains joint swelling frequent infections depression anxiety none of sleep decreased energy frequent infections blood in urine headache.  ALLERGIES: No Known Allergies  HOME MEDICATIONS: Outpatient Prescriptions Prior to Visit  Medication Sig Dispense Refill  . metFORMIN (GLUCOPHAGE) 500 MG tablet Take 1 tablet (500 mg total) by mouth 2 (two) times daily with a meal.  30 tablet  0  . pravastatin (PRAVACHOL) 40 MG tablet Take 40 mg by mouth at bedtime.      Marland Kitchen aspirin 81 MG tablet Take 81 mg by mouth 2 (two) times daily.      Marland Kitchen acetaminophen (TYLENOL ARTHRITIS PAIN) 650 MG CR tablet Take 1,300 mg by mouth every 8 (eight) hours as needed for pain. For pain      . HYDROcodone-acetaminophen (NORCO/VICODIN) 5-325 MG per tablet Take 2 tablets by mouth every 6 (six) hours as needed for pain.  30 tablet  0  . lisinopril (PRINIVIL,ZESTRIL) 10  MG tablet Take 1 tablet (10 mg total) by mouth daily.  30 tablet  0   No facility-administered medications prior to visit.    PAST MEDICAL HISTORY: Past Medical History  Diagnosis Date  . Arthritis   . Hypertension   . Lupus   . Rheumatoid aortitis   . Empty sella   . Hidradenitis axillaris   . Obesity (BMI 30.0-34.9)   . Hepatitis C   . Renal disorder     kidney stones  . Ovarian cyst   . CVA (cerebral infarction)   . Scoliosis   . Diabetes mellitus without complication   . Hyperlipemia   . Anxiety and depression   . Stroke March 2014    left MCA branches    PAST SURGICAL HISTORY: Past Surgical History  Procedure Laterality Date  . Abdominal hysterectomy    . Breast biopsy      FAMILY HISTORY: Family History  Problem Relation Age of Onset  . Diabetes Mother   . Heart failure Mother   . Stroke Maternal Aunt   . Diabetes      maternal  . Breast cancer    . Alzheimer's disease      maternal    SOCIAL HISTORY:  History   Social History  . Marital Status: Married    Spouse Name: N/A    Number of Children: 2  . Years of Education: college   Occupational History  . Disability    Social History Main Topics  .  Smoking status: Current Every Day Smoker -- 0.25 packs/day    Types: Cigarettes  . Smokeless tobacco: Not on file  . Alcohol Use: No  . Drug Use: No  . Sexually Active: Not Currently   Other Topics Concern  . Not on file   Social History Narrative   Patient currently lives at home with her disabled husband who was shot in 2009 and he is total care. She lives with her son and daughter and there is an 8 that comes in. Patient currently is on disability   She occasionally drinks caffeine.      PHYSICAL EXAM  Filed Vitals:   07/06/12 0934  BP: 182/102  Pulse: 88  Temp: 97.8 F (36.6 C)  TempSrc: Oral  Height: 5\' 7"  (1.702 m)  Weight: 224 lb (101.606 kg)   Body mass index is 35.08 kg/(m^2).  GENERAL EXAM: Patient is in no  distress  CARDIOVASCULAR: Regular rate and rhythm, no murmurs, no carotid bruits  NEUROLOGIC: MENTAL STATUS: awake, alert, language fluent, comprehension intact, naming intact CRANIAL NERVE: no papilledema on fundoscopic exam, pupils equal and reactive to light, visual fields full to confrontation, extraocular muscles intact, no nystagmus, DECREASED ON LEFT FACE TO LT, FACIAL STRENGTH SYMM, uvula midline, shoulder shrug symmetric, tongue midline. MOTOR: normal bulk and tone, full strength in the BUE, BLE SENSORY: normal and symmetric to light touch, pinprick, temperature, vibration and proprioception COORDINATION: finger-nose-finger, fine finger movements normal REFLEXES: deep tendon reflexes present and symmetric GAIT/STATION: narrow based gait; MILD DIFF WITH TOE, HEEL, TANDEM. romberg is negative   DIAGNOSTIC DATA (LABS, IMAGING, TESTING) - I reviewed patient records, labs, notes, testing and imaging myself where available.  Lab Results  Component Value Date   WBC 9.5 06/17/2012   HGB 15.5* 06/17/2012   HCT 44.5 06/17/2012   MCV 86.2 06/17/2012   PLT 328 06/17/2012      Component Value Date/Time   NA 140 06/17/2012 1854   K 4.0 06/17/2012 1854   CL 105 06/17/2012 1854   CO2 23 06/17/2012 1854   GLUCOSE 111* 06/17/2012 1854   BUN 13 06/17/2012 1854   CREATININE 0.89 06/17/2012 1854   CALCIUM 9.8 06/17/2012 1854   PROT 8.0 06/17/2012 1854   ALBUMIN 3.7 06/17/2012 1854   AST 19 06/17/2012 1854   ALT 18 06/17/2012 1854   ALKPHOS 96 06/17/2012 1854   BILITOT 0.3 06/17/2012 1854   GFRNONAA 73* 06/17/2012 1854   GFRAA 84* 06/17/2012 1854   Lab Results  Component Value Date   CHOL 258* 06/04/2012   HDL 44 06/04/2012   LDLCALC 144* 06/04/2012   TRIG 348* 06/04/2012   CHOLHDL 5.9 06/04/2012   Lab Results  Component Value Date   HGBA1C 7.3* 06/03/2012   No results found for this basename: VITAMINB12   No results found for this basename: TSH    06/04/12 MRI HEAD Multifocal areas of acute infarction affect the  left posterior frontal, and parietal cortex as well as the associated subcortical white matter. No hemorrhage. Chronic white matter changes as described.   06/04/12 MRA HEAD   The basilar artery shows mild proximal irregularity without flow limiting stenosis. Vertebrals are codominant without stenosis. Unremarkable right ACA and MCA. Left MCA is abnormal demonstrating a 50-75% stenosis proximal M1 segment and a 75-90% stenosis in the distal M1 segment affecting the trifurcation branches. Only one of the normal three left MCA M3 branches displays distal flow related enhancement, and that vessel is reduced in size and intensity.  The right PCA is diffusely diseased with no significant abnormality of the left PCA. There is no cerebellar branch occlusion. There is no visible intracranial aneurysm. IMPRESSION: Significant narrowing involving the proximal and distal left middle cerebral artery and its major branches with decreased number of visualized proximal M2 and M3 vessels. Similar diffuse changes distal right PCA. Atherosclerosis versus lupus vasculitis are considerations. The findings likely represent a hemodynamically significant left MCA intracranial stenosis. Formal catheter angiogram may be warranted.   2D Echocardiogram EF 65%, no wall motion abnormalities, atrium normal size   Carotid Doppler Bilateral: No hemodynamically significat ICA stenosis. Vertebral artery flow is antegrade.    ASSESSMENT AND PLAN  52 y.o. year old female  has a past medical history of Arthritis; Hypertension; Lupus; Rheumatoid aortitis; Empty sella; Hidradenitis axillaris; Obesity (BMI 30.0-34.9); Hepatitis C; Renal disorder; Ovarian cyst; CVA (cerebral infarction); Scoliosis; Diabetes mellitus without complication; Hyperlipemia; Anxiety and depression; and Stroke (March 2014). here with left posterior frontal and parietal ischemic infarcts (June 03, 2012), likely related to intracranial atherosclerosis and artery-artery  embolism.   I recommend medical management (aspirin, BP, lipid, diabetes control). Given intracranial atherosclerosis > 70%, I will start plavix 75mg  daily; 3 months of aspirin 81mg  + plavix 75mg  daily, then plavix alone.    Suanne Marker, MD 07/06/2012, 10:03 AM Certified in Neurology, Neurophysiology and Neuroimaging  [facility] [address], [address] [place], [place] [address] [phone]

## 2012-07-06 NOTE — Patient Instructions (Signed)
Stroke (Cerebrovascular Accident) A stroke (cerebrovascular accident, CVA) means you have a brain injury from blocked circulation or bleeding in the brain. Blocked circulation usually comes from a clot. RISK FACTORS  High blood pressure (hypertension).   High cholesterol.   Diabetes.   Heart disease.   The buildup of fatty deposits in the blood vessels (peripheral artery disease or atherosclerosis).   An abnormal heart rhythm (atrial fibrillation).   Obesity.   Smoking.   Taking oral contraceptives (especially in combination with smoking).   Physical inactivity.   A diet high in fats, salt (sodium), and calories.   Alcohol use.   Use of illegal drugs (especially cocaine and methamphetamine).   Being a female.   Being an African American.   Age over 55.   Family history of stroke.   Previous history of blood clots, a "warning stroke" (transient ischemic attack, TIA), or heart attack.   Sickle cell disease.  SYMPTOMS  The symptoms of a stroke depend on the part of the brain that is affected. It is important to seek treatment within 4 hours of the start of symptoms because you may receive a "clot dissolving" medication that cannot be given after that time. Even if you don't know when your symptoms began, get treatment as soon as possible. Symptoms of a stroke may progress or change over the first several days. Symptoms may include:  Sudden weakness or numbness of the face, arm, or leg, especially on one side of the body.   Sudden confusion.   Trouble speaking (aphasia) or understanding.   Sudden trouble seeing in one or both eyes.   Sudden trouble walking.   Dizziness.   Loss of balance or coordination.   Sudden severe headache with no known cause.  HOME CARE INSTRUCTIONS   Medicines: Aspirin and blood thinners may be used to prevent another stroke. Blood thinners need to be used exactly as instructed. Medicines may also be used to control risk factors for a  stroke. Be sure you understand all your medicine instructions.   Diet: Certain diets may be prescribed to address high blood pressure, high cholesterol, diabetes, or obesity. A diet that includes 5 or more servings of fruits and vegetables a day may reduce the risk of stroke. Foods may need to be a special consistency (soft or pureed), or small bites may need to be taken in order to avoid aspirating or choking.   Maintain a healthy weight.   Stay physically active. It is recommended that you get at least 30 minutes of activity on most or all days.   Do not smoke.   Limit alcohol use.   Stop drug abuse.   Home safety: A safe home environment is important to reduce the risk of falls. Your caregiver may arrange for specialists to evaluate your home. Having grab bars in the bedroom and bathroom is often important. Your caregiver may arrange for special equipment to be used at home, such as raised toilets and a seat for the shower.   Physical, occupational, and speech therapy: Ongoing therapy may be needed to maximize your recovery after a stroke. If you have been advised to use a walker or a cane, use it at all times. Be sure to keep your therapy appointments.   Follow all instructions for follow-up with your caregiver. This is VERY important. This includes any referrals, physical therapy, rehabilitation, and laboratory tests. Proper treatment also prevents another stroke from occurring.  SEEK IMMEDIATE MEDICAL CARE IF:   You have   sudden weakness or numbness of the face, arm, or leg, especially on one side of the body.   You have sudden confusion.   You have trouble speaking (aphasia) or understanding.   You have sudden trouble seeing in one or both eyes.   You have sudden trouble walking.   You have dizziness.   You have loss of balance or coordination.   You have a sudden, severe headache with no known cause.   You have a fever.   You are coughing or have difficulty breathing.    You have new chest pain, angina, or an irregular heartbeat.  Any of these symptoms may represent a serious problem that is an emergency. Do not wait to see if the symptoms will go away. Get medical help at once. Call your local emergency services (911 in [place] Do not drive yourself to the hospital. Document Released: 03/03/2005 Document Revised: 09/16/2010 Document Reviewed: 08/01/2009 [employer] Patient Information 2012 [employer]. 

## 2012-08-11 ENCOUNTER — Ambulatory Visit: Payer: Medicare Other | Admitting: Diagnostic Neuroimaging

## 2012-09-24 ENCOUNTER — Encounter (HOSPITAL_COMMUNITY): Payer: Self-pay | Admitting: Emergency Medicine

## 2012-09-24 ENCOUNTER — Emergency Department (HOSPITAL_COMMUNITY): Payer: Medicare Other

## 2012-09-24 ENCOUNTER — Emergency Department (HOSPITAL_COMMUNITY)
Admission: EM | Admit: 2012-09-24 | Discharge: 2012-09-24 | Disposition: A | Discharge status: Home / Self Care | Payer: Medicare Other | Attending: Emergency Medicine | Admitting: Emergency Medicine

## 2012-09-24 DIAGNOSIS — Z8639 Personal history of other endocrine, nutritional and metabolic disease: Secondary | ICD-10-CM | POA: Insufficient documentation

## 2012-09-24 DIAGNOSIS — I10 Essential (primary) hypertension: Secondary | ICD-10-CM | POA: Insufficient documentation

## 2012-09-24 DIAGNOSIS — F17200 Nicotine dependence, unspecified, uncomplicated: Secondary | ICD-10-CM | POA: Insufficient documentation

## 2012-09-24 DIAGNOSIS — Z8659 Personal history of other mental and behavioral disorders: Secondary | ICD-10-CM | POA: Insufficient documentation

## 2012-09-24 DIAGNOSIS — Z8739 Personal history of other diseases of the musculoskeletal system and connective tissue: Secondary | ICD-10-CM | POA: Insufficient documentation

## 2012-09-24 DIAGNOSIS — G459 Transient cerebral ischemic attack, unspecified: Secondary | ICD-10-CM | POA: Insufficient documentation

## 2012-09-24 DIAGNOSIS — E119 Type 2 diabetes mellitus without complications: Secondary | ICD-10-CM | POA: Insufficient documentation

## 2012-09-24 DIAGNOSIS — E669 Obesity, unspecified: Secondary | ICD-10-CM | POA: Insufficient documentation

## 2012-09-24 DIAGNOSIS — Z862 Personal history of diseases of the blood and blood-forming organs and certain disorders involving the immune mechanism: Secondary | ICD-10-CM | POA: Insufficient documentation

## 2012-09-24 DIAGNOSIS — Z7982 Long term (current) use of aspirin: Secondary | ICD-10-CM | POA: Insufficient documentation

## 2012-09-24 DIAGNOSIS — E785 Hyperlipidemia, unspecified: Secondary | ICD-10-CM | POA: Insufficient documentation

## 2012-09-24 DIAGNOSIS — Z79899 Other long term (current) drug therapy: Secondary | ICD-10-CM | POA: Insufficient documentation

## 2012-09-24 DIAGNOSIS — Z872 Personal history of diseases of the skin and subcutaneous tissue: Secondary | ICD-10-CM | POA: Insufficient documentation

## 2012-09-24 DIAGNOSIS — Z87442 Personal history of urinary calculi: Secondary | ICD-10-CM | POA: Insufficient documentation

## 2012-09-24 DIAGNOSIS — Z8673 Personal history of transient ischemic attack (TIA), and cerebral infarction without residual deficits: Secondary | ICD-10-CM | POA: Insufficient documentation

## 2012-09-24 DIAGNOSIS — Z8619 Personal history of other infectious and parasitic diseases: Secondary | ICD-10-CM | POA: Insufficient documentation

## 2012-09-24 DIAGNOSIS — Z8742 Personal history of other diseases of the female genital tract: Secondary | ICD-10-CM | POA: Insufficient documentation

## 2012-09-24 LAB — COMPREHENSIVE METABOLIC PANEL
ALT: 19 U/L (ref 0–35)
AST: 18 U/L (ref 0–37)
Albumin: 3.4 g/dL — ABNORMAL LOW (ref 3.5–5.2)
Alkaline Phosphatase: 93 U/L (ref 39–117)
BUN: 11 mg/dL (ref 6–23)
CO2: 28 mEq/L (ref 19–32)
Calcium: 9.5 mg/dL (ref 8.4–10.5)
Chloride: 102 mEq/L (ref 96–112)
Creatinine, Ser: 0.73 mg/dL (ref 0.50–1.10)
GFR calc Af Amer: >90 mL/min (ref >90)
GFR calc non Af Amer: >90 mL/min (ref >90)
Glucose, Bld: 164 mg/dL — ABNORMAL HIGH (ref 70–99)
Potassium: 3.6 mEq/L (ref 3.5–5.1)
Sodium: 139 mEq/L (ref 135–145)
Total Bilirubin: 0.2 mg/dL — ABNORMAL LOW (ref 0.3–1.2)
Total Protein: 7.6 g/dL (ref 6.0–8.3)

## 2012-09-24 LAB — CBC
HCT: 42.7 % (ref 36.0–46.0)
Hemoglobin: 14.2 g/dL (ref 12.0–15.0)
MCH: 29.2 pg (ref 26.0–34.0)
MCHC: 33.3 g/dL (ref 30.0–36.0)
MCV: 87.7 fL (ref 78.0–100.0)
Platelets: 294 10*3/uL (ref 150–400)
RBC: 4.87 MIL/uL (ref 3.87–5.11)
RDW: 15.8 % — ABNORMAL HIGH (ref 11.5–15.5)
WBC: 10.7 10*3/uL — ABNORMAL HIGH (ref 4.0–10.5)

## 2012-09-24 LAB — POCT I-STAT, CHEM 8
BUN: 11 mg/dL (ref 6–23)
Calcium, Ion: 1.18 mmol/L (ref 1.12–1.23)
Chloride: 104 mEq/L (ref 96–112)
Creatinine, Ser: 0.8 mg/dL (ref 0.50–1.10)
Glucose, Bld: 168 mg/dL — ABNORMAL HIGH (ref 70–99)
HCT: 46 % (ref 36.0–46.0)
Hemoglobin: 15.6 g/dL — ABNORMAL HIGH (ref 12.0–15.0)
Potassium: 3.5 mEq/L (ref 3.5–5.1)
Sodium: 143 mEq/L (ref 135–145)
TCO2: 26 mmol/L (ref 0–100)

## 2012-09-24 LAB — POCT I-STAT TROPONIN I: Troponin i, poc: 0 ng/mL (ref 0.00–0.08)

## 2012-09-24 LAB — DIFFERENTIAL
Basophils Absolute: 0.1 10*3/uL (ref 0.0–0.1)
Basophils Relative: 1 % (ref 0–1)
Eosinophils Absolute: 0.5 10*3/uL (ref 0.0–0.7)
Eosinophils Relative: 4 % (ref 0–5)
Lymphocytes Relative: 38 % (ref 12–46)
Lymphs Abs: 4 10*3/uL (ref 0.7–4.0)
Monocytes Absolute: 0.8 10*3/uL (ref 0.1–1.0)
Monocytes Relative: 8 % (ref 3–12)
Neutro Abs: 5.3 10*3/uL (ref 1.7–7.7)
Neutrophils Relative %: 50 % (ref 43–77)

## 2012-09-24 LAB — GLUCOSE, CAPILLARY: Glucose-Capillary: 117 mg/dL — ABNORMAL HIGH (ref 70–99)

## 2012-09-24 LAB — APTT: aPTT: 32 seconds (ref 24–37)

## 2012-09-24 LAB — PROTIME-INR
INR: 0.99 (ref 0.00–1.49)
Prothrombin Time: 12.9 seconds (ref 11.6–15.2)

## 2012-09-24 LAB — TROPONIN I: Troponin I: <0.3 ng/mL (ref <0.30)

## 2012-09-24 MED ORDER — LORAZEPAM 2 MG/ML IJ SOLN
1.0000 mg | Freq: Once | INTRAMUSCULAR | Status: AC
  Administered 2012-09-24: 1 mg via INTRAMUSCULAR
  Filled 2012-09-24: qty 1

## 2012-09-24 MED ORDER — LORAZEPAM 1 MG PO TABS
1.0000 mg | ORAL_TABLET | Freq: Once | ORAL | Status: AC
  Administered 2012-09-24: 1 mg via ORAL
  Filled 2012-09-24: qty 1

## 2012-09-24 NOTE — ED Notes (Signed)
PT. REPORTS RIGHT FACIAL NUMBNESS  WHILE WAITING WITH FAMILY AT WAITING AREA THIS EVENING, PT. STATED  " STRANGE FEELING" ON MY RIGHT FACE , SPEECH CLEAR , NO FACIAL ASYMMETRY , EQUAL GRIPS , ALERT AND ORIENTED , RESPIRATIONS UNLABORED . PT. STATED HISTORY OF " MINI STROKES".

## 2012-09-24 NOTE — ED Provider Notes (Signed)
Patient signed out to me to follow up on MRI. This brief episode of right facial numbness and tingling. No neurologic deficit noted initially and repeat examination this morning shows no neurologic deficit. Symptoms remain resolved. MRI does not show an acute stroke. MRI was compared to MRI in March. The MRI in March showed an area that was thought to be infarction. MRI today, however, does not show an obvious area of sequela for that area today. Additionally areas of abnormal signal in the deep/periventricular white matter in the right pons. This raises the possibility of multiple sclerosis. Case was briefly discussed with Doctor Amada Jupiter, neurology. He did confirm that no additional imaging or treatment is necessary at this time, outpatient followup with her neurologist is appropriate.  Gilda Crease, MD 09/24/12 [phone]

## 2012-09-24 NOTE — ED Notes (Signed)
PT transported to MRI. MRI contacted this RN to state that pt is requesting medication to help her "relax" EDP aware

## 2012-09-24 NOTE — ED Notes (Addendum)
MD at bedside. 

## 2012-09-24 NOTE — ED Notes (Signed)
Pt refusing cardiac monitoring at this time, states the cardiac leads/stickers are irritating the skin.

## 2012-09-24 NOTE — ED Provider Notes (Signed)
History    CSN: [id number] Arrival date & time 09/24/12  Rich Fuchs  First MD Initiated Contact with Patient 09/24/12 0335     Chief Complaint  Patient presents with  . Numbness   (Consider location/radiation/quality/duration/timing/severity/associated sxs/prior Treatment) HPI HX per PT  - in the waiting room with her husband, developed R facial numbness that lasted seconds maybe up to a minute. No change in speech.  She has h/o TIA with similar presentation but at that time she also had speech deficits - this was about 3 months ago  - had CVA work up at that time. Tonight no weakness, no trouble with vision or gait. She takes ASA and plavix.  Symptoms were MOD in severity.   Past Medical History  Diagnosis Date  . Arthritis   . Hypertension   . Lupus   . Rheumatoid aortitis   . Empty sella   . Hidradenitis axillaris   . Obesity (BMI 30.0-34.9)   . Hepatitis C   . Renal disorder     kidney stones  . Ovarian cyst   . CVA (cerebral infarction)   . Scoliosis   . Diabetes mellitus without complication   . Hyperlipemia   . Anxiety and depression   . Stroke March 2014    left MCA branches   Past Surgical History  Procedure Laterality Date  . Abdominal hysterectomy    . Breast biopsy     Family History  Problem Relation Age of Onset  . Diabetes Mother   . Heart failure Mother   . Stroke Maternal Aunt   . Diabetes      maternal  . Breast cancer    . Alzheimer's disease      maternal   History  Substance Use Topics  . Smoking status: Current Every Day Smoker -- 0.25 packs/day    Types: Cigarettes  . Smokeless tobacco: Not on file  . Alcohol Use: No   OB History   Grav Para Term Preterm Abortions TAB SAB Ect Mult Living                 Review of Systems  Constitutional: Negative for fever and chills.  HENT: Negative for neck pain and neck stiffness.   Eyes: Negative for pain.  Respiratory: Negative for shortness of breath.   Cardiovascular: Negative for chest  pain.  Gastrointestinal: Negative for abdominal pain.  Genitourinary: Negative for dysuria.  Musculoskeletal: Negative for back pain.  Skin: Negative for rash.  Neurological: Positive for numbness. Negative for seizures, weakness and headaches.  All other systems reviewed and are negative.    Allergies  Review of patient's allergies indicates no known allergies.  Home Medications   Current Outpatient Rx  Name  Route  Sig  Dispense  Refill  . aspirin 81 MG tablet   Oral   Take 1 tablet (81 mg total) by mouth daily.   30 tablet      . clopidogrel (PLAVIX) 75 MG tablet   Oral   Take 1 tablet (75 mg total) by mouth daily.   30 tablet   11   . metFORMIN (GLUCOPHAGE) 500 MG tablet   Oral   Take 1 tablet (500 mg total) by mouth 2 (two) times daily with a meal.   30 tablet   0   . Olmesartan-Amlodipine-HCTZ (TRIBENZOR PO)   Oral   Take 1 tablet by mouth daily.         . pravastatin (PRAVACHOL) 40 MG tablet   Oral  Take 40 mg by mouth at bedtime.          BP 131/84  Pulse 70  Temp(Src) 97.5 F (36.4 C) (Oral)  Resp 17  SpO2 98% Physical Exam  Constitutional: She is oriented to person, place, and time. She appears well-developed and well-nourished.  HENT:  Head: Normocephalic and atraumatic.  Eyes: EOM are normal. Pupils are equal, round, and reactive to light.  Neck: Neck supple.  Cardiovascular: Normal rate, regular rhythm and intact distal pulses.   Pulmonary/Chest: Effort normal and breath sounds normal. No respiratory distress.  Abdominal: Soft. She exhibits no distension. There is no tenderness.  Musculoskeletal: Normal range of motion. She exhibits no edema.  Neurological: She is alert and oriented to person, place, and time. She displays normal reflexes. No cranial nerve deficit. She exhibits normal muscle tone. Coordination normal.  No facial droop. No pronator drift. Sensorium to light touch equal and intact throughout  Skin: Skin is warm and dry.     ED Course  Procedures (including critical care time) Labs Reviewed  CBC - Abnormal; Notable for the following:    WBC 10.7 (*)    RDW 15.8 (*)    All other components within normal limits  COMPREHENSIVE METABOLIC PANEL - Abnormal; Notable for the following:    Glucose, Bld 164 (*)    Albumin 3.4 (*)    Total Bilirubin 0.2 (*)    All other components within normal limits  GLUCOSE, CAPILLARY - Abnormal; Notable for the following:    Glucose-Capillary 117 (*)    All other components within normal limits  POCT I-STAT, CHEM 8 - Abnormal; Notable for the following:    Glucose, Bld 168 (*)    Hemoglobin 15.6 (*)    All other components within normal limits  PROTIME-INR  APTT  DIFFERENTIAL  TROPONIN I  POCT I-STAT TROPONIN I   Ct Head (brain) Wo Contrast  09/24/2012   *RADIOLOGY REPORT*  Clinical Data: Stabbing headache with numbness in the right side of the face.  CT HEAD WITHOUT CONTRAST  Technique:  Contiguous axial images were obtained from the base of the skull through the vertex without contrast.  Comparison: 06/04/2012  Findings: Tiny punctate areas of low attenuation change in the deep white matter on the left likely represent old lacunar infarcts. The ventricles and sulci are symmetrical without significant effacement, displacement, or dilatation. No mass effect or midline shift. No abnormal extra-axial fluid collections. The grey-white matter junction is distinct. Basal cisterns are not effaced. No acute intracranial hemorrhage. No depressed skull fractures. Visualized paranasal sinuses and mastoid air cells are not opacified.  No significant changes since the previous study.  IMPRESSION: No acute intracranial abnormalities.   Original Report Authenticated By: Burman Nieves, M.D.   4:07 AM d/w NEU Dr Roseanne Reno - he recs MRI and if negative, does not need any further work up. MRI ordered  MDM  Brief TIA symptoms, recent TIA admit and work up.   CT, labs obtained reviewed  MR  pending - DR Blinda Leatherwood to follow   Sunnie Nielsen, MD 09/24/12 [phone]

## 2012-10-05 ENCOUNTER — Ambulatory Visit (INDEPENDENT_AMBULATORY_CARE_PROVIDER_SITE_OTHER): Payer: Medicare Other | Admitting: Diagnostic Neuroimaging

## 2012-10-05 VITALS — BP 122/76 | HR 87 | Temp 98.5°F | Ht 67.0 in | Wt 221.0 lb

## 2012-10-05 DIAGNOSIS — I639 Cerebral infarction, unspecified: Secondary | ICD-10-CM

## 2012-10-05 DIAGNOSIS — E785 Hyperlipidemia, unspecified: Secondary | ICD-10-CM

## 2012-10-05 DIAGNOSIS — I10 Essential (primary) hypertension: Secondary | ICD-10-CM

## 2012-10-05 DIAGNOSIS — E1149 Type 2 diabetes mellitus with other diabetic neurological complication: Secondary | ICD-10-CM

## 2012-10-05 DIAGNOSIS — I6350 Cerebral infarction due to unspecified occlusion or stenosis of unspecified cerebral artery: Secondary | ICD-10-CM

## 2012-10-05 MED ORDER — PRAVASTATIN SODIUM 40 MG PO TABS: 40.0000 mg | ORAL_TABLET | Freq: Every day | ORAL | Status: DC

## 2012-10-05 NOTE — Progress Notes (Signed)
[facility]  PATIENT: [name] [name] DOB: 1960-10-21   HISTORY FROM: patient, chart REASON FOR VISIT: stroke followup   HISTORICAL  CHIEF COMPLAINT:  Chief Complaint  Patient presents with  . Follow-up    Rv#    HISTORY OF PRESENT ILLNESS: Update 10/05/12:  Patient returns for follow up from stroke on 07/06/12.  She has no residual weakness or speech difficulty.  Having occasional headaches with sharp pains through the top of her head.  She states she is under extreme stress, husband in hospital now in [place] who had repeat abdominal surgery is not doing well, may not survive.  No new neurological problems.  Has been taking Plavix and aspirin daily since discharge.  Prior HPI:  52 y.o. AA female with a history of hypertension, lupus and rheumatoid arthritis who came to the emergency room following an episode of transient speech abnormality and numbness involving left side of her face on 06/04/12. She had a headache at the time as well. Symptoms lasted about 20 minutes. Speech was described as gibberish. Patient had a similar spell in November 2013 but did not seek medical attention. She has not been on antiplatelet therapy. CT scan of the head showed no acute intracranial abnormality. NIH score at the time of this evaluation was 0.  REVIEW OF SYSTEMS: Full 14 system review of systems performed and notable only for:  Constitutional: fatigue  Cardiovascular: swelling in legs  Ear/Nose/Throat: N/A Skin: N/A  Eyes: eye pain  Respiratory: N/A  Gastroitestinal: N/A  Hematology/Lymphatic: N/A  Endocrine: N/A Musculoskeletal:N/A  Allergy/Immunology: N/A  Neurological: headache Psychiatric: Insomnia, sleepiness   ALLERGIES: No Known Allergies  HOME MEDICATIONS: Outpatient Prescriptions Prior to Visit  Medication Sig Dispense Refill  . aspirin 81 MG tablet Take 1 tablet (81 mg total) by mouth daily.  30 tablet    . clopidogrel (PLAVIX) 75 MG tablet Take  75 mg by mouth at bedtime.      . metFORMIN (GLUCOPHAGE) 500 MG tablet Take 1 tablet (500 mg total) by mouth 2 (two) times daily with a meal.  30 tablet  0  . Olmesartan-Amlodipine-HCTZ (TRIBENZOR) 40-10-25 MG TABS Take 1 tablet by mouth daily.      . pravastatin (PRAVACHOL) 40 MG tablet Take 40 mg by mouth at bedtime.       No facility-administered medications prior to visit.    PAST MEDICAL HISTORY: Past Medical History  Diagnosis Date  . Arthritis   . Hypertension   . Lupus   . Rheumatoid aortitis   . Empty sella   . Hidradenitis axillaris   . Obesity (BMI 30.0-34.9)   . Hepatitis C   . Renal disorder     kidney stones  . Ovarian cyst   . CVA (cerebral infarction)   . Scoliosis   . Diabetes mellitus without complication   . Hyperlipemia   . Anxiety and depression   . Stroke March 2014    left MCA branches    PAST SURGICAL HISTORY: Past Surgical History  Procedure Laterality Date  . Abdominal hysterectomy    . Breast biopsy      FAMILY HISTORY: Family History  Problem Relation Age of Onset  . Diabetes Mother   . Heart failure Mother   . Stroke Maternal Aunt   . Diabetes      maternal  . Breast cancer    . Alzheimer's disease      maternal    SOCIAL HISTORY: History   Social History  .  Marital Status: Married    Spouse Name: N/A    Number of Children: 2  . Years of Education: college   Occupational History  . Disability    Social History Main Topics  . Smoking status: Current Every Day Smoker -- 0.25 packs/day    Types: Cigarettes  . Smokeless tobacco: Not on file  . Alcohol Use: No  . Drug Use: No  . Sexually Active: Not Currently   Other Topics Concern  . Not on file   Social History Narrative   Patient currently lives at home with her disabled husband who was shot in 2009 and he is total care. She lives with her son and daughter and there is an 8 that comes in. Patient currently is on disability   She occasionally drinks caffeine.       PHYSICAL EXAM  Filed Vitals:   10/05/12 1047  BP: 122/76  Pulse: 87  Temp: 98.5 F (36.9 C)  TempSrc: Oral  Height: 5\' 7"  (1.702 m)  Weight: 221 lb (100.245 kg)   Body mass index is 34.61 kg/(m^2).  Generalized: In no acute distress, pleasant AA female  Neck: Supple, no carotid bruits   Cardiac: Regular rate rhythm, no murmur   Pulmonary: Clear to auscultation bilaterally   Musculoskeletal: No deformity   Neurological examination   Mentation: Alert oriented to time, place, history taking, language fluent, and causual conversation  Cranial nerve II-XII: Pupils were equal round reactive to light extraocular movements were full, visual field were full on confrontational test. facial sensation and strength were normal. hearing was intact to finger rubbing bilaterally. Uvula tongue midline. head turning and shoulder shrug and were normal and symmetric.Tongue protrusion into cheek strength was normal. MOTOR: normal bulk and tone, full strength in the BUE, BLE, fine finger movements normal, no pronator drift SENSORY: normal and symmetric to light touch, pinprick, temperature, vibration and proprioception COORDINATION: finger-nose-finger, heel-to-shin bilaterally, there was no truncal ataxia REFLEXES: Brachioradialis 2/2, biceps 2/2, triceps 2/2, patellar 2/2, Achilles 2/2, plantar responses were flexor bilaterally. GAIT/STATION: Rising up from seated position without assistance, normal stance, without trunk ataxia, moderate stride, good arm swing, smooth turning, Unable to perform tiptoe(due to pain), heel walking and tandem without difficulty.   DIAGNOSTIC DATA (LABS, IMAGING, TESTING) - I reviewed patient records, labs, notes, testing and imaging myself where available.  Lab Results  Component Value Date   WBC 10.7* 09/24/2012   HGB 15.6* 09/24/2012   HCT 46.0 09/24/2012   MCV 87.7 09/24/2012   PLT 294 09/24/2012      Component Value Date/Time   NA 143 09/24/2012 0058    K 3.5 09/24/2012 0058   CL 104 09/24/2012 0058   CO2 28 09/24/2012 0043   GLUCOSE 168* 09/24/2012 0058   BUN 11 09/24/2012 0058   CREATININE 0.80 09/24/2012 0058   CALCIUM 9.5 09/24/2012 0043   PROT 7.6 09/24/2012 0043   ALBUMIN 3.4* 09/24/2012 0043   AST 18 09/24/2012 0043   ALT 19 09/24/2012 0043   ALKPHOS 93 09/24/2012 0043   BILITOT 0.2* 09/24/2012 0043   GFRNONAA >90 09/24/2012 0043   GFRAA >90 09/24/2012 0043   Lab Results  Component Value Date   CHOL 258* 06/04/2012   HDL 44 06/04/2012   LDLCALC 144* 06/04/2012   TRIG 348* 06/04/2012   CHOLHDL 5.9 06/04/2012   Lab Results  Component Value Date   HGBA1C 7.3* 06/03/2012   No results found for this basename: VITAMINB12   No results found for  this basename: TSH   06/04/12 MRI HEAD  Multifocal areas of acute infarction affect the left posterior frontal, and parietal cortex as well as the associated subcortical white matter. No hemorrhage. Chronic white matter changes as described.  06/04/12 MRA HEAD  The basilar artery shows mild proximal irregularity without flow limiting stenosis. Vertebrals are codominant without stenosis. Unremarkable right ACA and MCA. Left MCA is abnormal demonstrating a 50-75% stenosis proximal M1 segment and a 75-90% stenosis in the distal M1 segment affecting the trifurcation branches. Only one of the normal three left MCA M3 branches displays distal flow related enhancement, and that vessel is reduced in size and intensity. The right PCA is diffusely diseased with no significant abnormality of the left PCA. There is no cerebellar branch occlusion. There is no visible intracranial aneurysm. IMPRESSION: Significant narrowing involving the proximal and distal left middle cerebral artery and its major branches with decreased number of visualized proximal M2 and M3 vessels. Similar diffuse changes distal right PCA. Atherosclerosis versus lupus vasculitis are considerations. The findings likely represent a hemodynamically  significant left MCA intracranial stenosis. Formal catheter angiogram may be warranted.  2D Echocardiogram EF 65%, no wall motion abnormalities, atrium normal size  Carotid Doppler Bilateral: No hemodynamically significat ICA stenosis. Vertebral artery flow is antegrade.   ASSESSMENT AND PLAN 52 y.o. year old female has a past medical history of Arthritis; Hypertension; Lupus; Rheumatoid aortitis; Empty sella; Hidradenitis axillaris; Obesity (BMI 30.0-34.9); Hepatitis C; Renal disorder; Ovarian cyst; CVA (cerebral infarction); Scoliosis; Diabetes mellitus without complication; Hyperlipemia; Anxiety and depression; and Stroke (March 2014). here with left posterior frontal and parietal ischemic infarcts (June 03, 2012), likely related to intracranial atherosclerosis and artery-artery embolism.   PLAN: Continue clopidogrel 75 mg orally every day  for secondary stroke prevention and maintain strict control of hypertension with blood pressure goal below 130/90, diabetes with hemoglobin A1c goal below 6.5% and lipids with LDL cholesterol goal below 70 mg/dL.   Stop taking Aspirin 81mg .  Followup in 3-6 months.  Ambreen Tufte NP-C 10/05/2012, 11:27 AM  [facility] [address] [place], [place] [address] [phone]  up

## 2012-10-05 NOTE — Progress Notes (Signed)
I reviewed note and agree with plan.   Suanne Marker, MD 10/05/2012, 1:30 PM Certified in Neurology, Neurophysiology and Neuroimaging  [facility] [address], [address] [place], [place] [address] [phone]

## 2012-10-05 NOTE — Patient Instructions (Signed)
Continue clopidogrel 75 mg orally every day  for secondary stroke prevention and maintain strict control of hypertension with blood pressure goal below 130/90, diabetes with hemoglobin A1c goal below 6.5% and lipids with LDL cholesterol goal below 100 mg/dL.  Followup in 3-6 months.  STROKE/TIA INSTRUCTIONS SMOKING Cigarette smoking nearly doubles your risk of having a stroke & is the single most alterable risk factor  If you smoke or have smoked in the last 12 months, you are advised to quit smoking for your health.  Most of the excess cardiovascular risk related to smoking disappears within a year of stopping.  Ask you doctor about anti-smoking medications  [place] Quit Line: 1-800-QUIT NOW  Free Smoking Cessation Classes ([phone]  CHOLESTEROL Know your levels; limit fat & cholesterol in your diet  Lab Results  Component Value Date   CHOL 258* 06/04/2012   HDL 44 06/04/2012   LDLCALC 144* 06/04/2012   TRIG 348* 06/04/2012   CHOLHDL 5.9 06/04/2012      Many patients benefit from treatment even if their cholesterol is at goal.  Goal: Total Cholesterol less than 160  Goal:  LDL less than 100  Goal:  HDL greater than 40  Goal:  Triglycerides less than 150  BLOOD PRESSURE American Stroke Association blood pressure target is less that 120/80 mm/Hg  Your discharge blood pressure is:  BP: 122/76 mmHg  Monitor your blood pressure  Limit your salt and alcohol intake  Many individuals will require more than one medication for high blood pressure  DIABETES (A1c is a blood sugar average for last 3 months) Goal A1c is under 7% (A1c is blood sugar average for last 3 months)  Diabetes: Diagnosis of diabetes:  Your A1c:7.3 %    Lab Results  Component Value Date   HGBA1C 7.3* 06/03/2012    Your A1c can be lowered with medications, healthy diet, and exercise.  Check your blood sugar as directed by your physician  Call your physician if you experience unexplained or low blood sugars.   PHYSICAL ACTIVITY/REHABILITATION Goal is 30 minutes at least 4 days per week    Activity decreases your risk of heart attack and stroke and makes your heart stronger.  It helps control your weight and blood pressure; helps you relax and can improve your mood.  Participate in a regular exercise program.  Talk with your doctor about the best form of exercise for you (dancing, walking, swimming, cycling).  DIET/WEIGHT Goal is to maintain a healthy weight  Your height is:  Height: 5\' 7"  (170.2 cm) Your current weight is: Weight: 221 lb (100.245 kg) Your body Mass Index (BMI) is:  BMI (Calculated): 34.7  Following the type of diet specifically designed for you will help prevent another stroke.  Your goal Body Mass Index (BMI) is 19-24.  Healthy food habits can help reduce 3 risk factors for stroke:  High cholesterol, hypertension, and excess weight.

## 2012-10-26 ENCOUNTER — Ambulatory Visit: Payer: Medicare Other | Admitting: Dietician

## 2012-11-17 ENCOUNTER — Encounter: Payer: Self-pay | Admitting: Dietician

## 2012-11-17 ENCOUNTER — Encounter: Payer: Medicare Other | Attending: Family Medicine | Admitting: Dietician

## 2012-11-17 VITALS — Ht 67.0 in | Wt 219.3 lb

## 2012-11-17 DIAGNOSIS — E785 Hyperlipidemia, unspecified: Secondary | ICD-10-CM

## 2012-11-17 DIAGNOSIS — E1149 Type 2 diabetes mellitus with other diabetic neurological complication: Secondary | ICD-10-CM | POA: Insufficient documentation

## 2012-11-17 DIAGNOSIS — E669 Obesity, unspecified: Secondary | ICD-10-CM | POA: Insufficient documentation

## 2012-11-17 DIAGNOSIS — I10 Essential (primary) hypertension: Secondary | ICD-10-CM

## 2012-11-17 DIAGNOSIS — Z713 Dietary counseling and surveillance: Secondary | ICD-10-CM | POA: Insufficient documentation

## 2012-11-17 DIAGNOSIS — I639 Cerebral infarction, unspecified: Secondary | ICD-10-CM

## 2012-11-17 NOTE — Progress Notes (Signed)
Medical Nutrition Therapy:  Appt start time: 0800 end time:  0900.  Assessment:  Pt referred for Overweight/Obesity. Primary concerns today: Hx of CVA, DM type II, HTN, Hyperlipidemia, Lupus. Current Labs include TC 268, TG 263, LDL 162, VLDL 53, HgbA1c 7.1. Recent BP 136/87. Pt c/o ageusia and anosmia transiently.  MEDICATIONS: see list   DIETARY INTAKE: Usual eating pattern includes 1-3 meals and 0-2 snacks per day. Everyday foods include CIB, chix.  Avoided foods include numerous, especially aspartame-sweetened foods, fish b/c daughter is allergic, most legumes.    24-hr recall:  B ( AM): carnation instant breakfast with milk (1%), sometimes OJ  Snk ( AM): none  L ( PM): sometimes no lunch. chix salad on crackers with caffeine free pepsi or sweet tea. Snk ( PM): maybe fruit D ( PM): usually baked chix as protein or fried chix. Sides may include broccoli, string beans, collard greens. Whole wheat pasta with butter. Very little rice, sometimes fried rice from [employer]. Sometimes will skip this meal. Snk ( PM): grapes, bowl of applesauce, crackers.  Beverages: mostly caffeine free tea, caffeine free tea, water, low fat milk. No EtOH.  Refuses to drink diet or sugar free drinks due to distaste. Claims to love nuts and seeds and eat them regularly.   Usual physical activity: no formal exercise. 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 Claims she does not have the energy to "do anything".   Progress Towards Goal(s):  In progress.   Nutritional Diagnosis:  [place]-3.3 Overweight/obesity As related to irregular eating pattern, high intake of sweetened drinks.  As evidenced by BMI>30.    Intervention:  Nutrition counseling provided regarding better control of DM, total CHO reduction, and increasing intake of more heart healthful foods. RD demonstrated the Plate Method for portioning foods for both DM and weight control. Pt claims to not much like plain  water, but agreed to limit sweet beverages to 1 per day, try new tea recipes with different non-nutritive sweeteners. Rd and pt reviewed best foods for heart health, with focus on choosing more high fiber foods (whole grain, veg, fruit), lean proteins, and beneficial fats (fish, nuts, seeds). Because pt is unable to eat fish regularly and dislikes soy, RD recommended 3000 mg fish oil supplement per day to assist in reducing blood lipids, blood pressure. Basics of sodium reduction and DASH diet covered also.  Handouts given during visit include:  How to read supplement label  Heart Healthful Foods  DASH diet  Monitoring/Evaluation:  Dietary intake, exercise, CHO control, and body weight in 6 week(s).

## 2013-01-03 ENCOUNTER — Ambulatory Visit: Payer: Medicare Other | Admitting: Dietician

## 2013-01-20 ENCOUNTER — Other Ambulatory Visit: Payer: Self-pay

## 2013-02-03 ENCOUNTER — Ambulatory Visit: Payer: Medicare Other | Admitting: Diagnostic Neuroimaging

## 2013-02-07 ENCOUNTER — Ambulatory Visit: Payer: Medicare Other | Admitting: Diagnostic Neuroimaging

## 2013-02-16 ENCOUNTER — Ambulatory Visit: Payer: Medicare Other | Admitting: Nurse Practitioner

## 2014-06-22 DIAGNOSIS — L309 Dermatitis, unspecified: Secondary | ICD-10-CM | POA: Diagnosis not present

## 2014-06-22 DIAGNOSIS — E785 Hyperlipidemia, unspecified: Secondary | ICD-10-CM | POA: Diagnosis not present

## 2014-06-22 DIAGNOSIS — Z8673 Personal history of transient ischemic attack (TIA), and cerebral infarction without residual deficits: Secondary | ICD-10-CM | POA: Diagnosis not present

## 2014-06-22 DIAGNOSIS — E1129 Type 2 diabetes mellitus with other diabetic kidney complication: Secondary | ICD-10-CM | POA: Diagnosis not present

## 2014-06-22 DIAGNOSIS — R768 Other specified abnormal immunological findings in serum: Secondary | ICD-10-CM | POA: Diagnosis not present

## 2014-06-22 DIAGNOSIS — I10 Essential (primary) hypertension: Secondary | ICD-10-CM | POA: Diagnosis not present

## 2014-06-22 DIAGNOSIS — E119 Type 2 diabetes mellitus without complications: Secondary | ICD-10-CM | POA: Diagnosis not present

## 2014-06-22 DIAGNOSIS — M069 Rheumatoid arthritis, unspecified: Secondary | ICD-10-CM | POA: Diagnosis not present

## 2014-07-06 DIAGNOSIS — E1121 Type 2 diabetes mellitus with diabetic nephropathy: Secondary | ICD-10-CM | POA: Diagnosis not present

## 2014-07-06 DIAGNOSIS — R809 Proteinuria, unspecified: Secondary | ICD-10-CM | POA: Diagnosis not present

## 2014-07-06 DIAGNOSIS — R319 Hematuria, unspecified: Secondary | ICD-10-CM | POA: Diagnosis not present

## 2014-07-06 DIAGNOSIS — E785 Hyperlipidemia, unspecified: Secondary | ICD-10-CM | POA: Diagnosis not present

## 2014-07-13 DIAGNOSIS — H5203 Hypermetropia, bilateral: Secondary | ICD-10-CM | POA: Diagnosis not present

## 2014-07-13 DIAGNOSIS — H5210 Myopia, unspecified eye: Secondary | ICD-10-CM | POA: Diagnosis not present

## 2014-09-29 DIAGNOSIS — R310 Gross hematuria: Secondary | ICD-10-CM | POA: Diagnosis not present

## 2014-09-29 DIAGNOSIS — E1121 Type 2 diabetes mellitus with diabetic nephropathy: Secondary | ICD-10-CM | POA: Diagnosis not present

## 2014-09-29 DIAGNOSIS — E785 Hyperlipidemia, unspecified: Secondary | ICD-10-CM | POA: Diagnosis not present

## 2014-09-29 DIAGNOSIS — I10 Essential (primary) hypertension: Secondary | ICD-10-CM | POA: Diagnosis not present

## 2015-01-19 DIAGNOSIS — R809 Proteinuria, unspecified: Secondary | ICD-10-CM | POA: Diagnosis not present

## 2015-01-19 DIAGNOSIS — L0293 Carbuncle, unspecified: Secondary | ICD-10-CM | POA: Diagnosis not present

## 2015-01-19 DIAGNOSIS — E1121 Type 2 diabetes mellitus with diabetic nephropathy: Secondary | ICD-10-CM | POA: Diagnosis not present

## 2015-01-19 DIAGNOSIS — I10 Essential (primary) hypertension: Secondary | ICD-10-CM | POA: Diagnosis not present

## 2015-04-04 DIAGNOSIS — E1121 Type 2 diabetes mellitus with diabetic nephropathy: Secondary | ICD-10-CM | POA: Diagnosis not present

## 2015-04-04 DIAGNOSIS — I10 Essential (primary) hypertension: Secondary | ICD-10-CM | POA: Diagnosis not present

## 2015-04-04 DIAGNOSIS — Z7984 Long term (current) use of oral hypoglycemic drugs: Secondary | ICD-10-CM | POA: Diagnosis not present

## 2015-04-04 DIAGNOSIS — E785 Hyperlipidemia, unspecified: Secondary | ICD-10-CM | POA: Diagnosis not present

## 2015-04-04 DIAGNOSIS — Z23 Encounter for immunization: Secondary | ICD-10-CM | POA: Diagnosis not present

## 2015-05-30 ENCOUNTER — Other Ambulatory Visit: Payer: Self-pay

## 2015-05-30 DIAGNOSIS — Z1231 Encounter for screening mammogram for malignant neoplasm of breast: Secondary | ICD-10-CM

## 2015-05-30 DIAGNOSIS — Z803 Family history of malignant neoplasm of breast: Secondary | ICD-10-CM

## 2015-06-12 ENCOUNTER — Emergency Department (HOSPITAL_COMMUNITY): Payer: Commercial Managed Care - HMO

## 2015-06-12 ENCOUNTER — Inpatient Hospital Stay (HOSPITAL_COMMUNITY)
Admission: EM | Admit: 2015-06-12 | Discharge: 2015-06-25 | DRG: 234 | Disposition: A | Discharge status: Home / Self Care | Payer: Commercial Managed Care - HMO | Attending: Cardiothoracic Surgery | Admitting: Cardiothoracic Surgery

## 2015-06-12 ENCOUNTER — Encounter (HOSPITAL_COMMUNITY): Payer: Self-pay | Admitting: Emergency Medicine

## 2015-06-12 ENCOUNTER — Observation Stay (HOSPITAL_COMMUNITY): Payer: Commercial Managed Care - HMO

## 2015-06-12 DIAGNOSIS — Z7902 Long term (current) use of antithrombotics/antiplatelets: Secondary | ICD-10-CM | POA: Diagnosis not present

## 2015-06-12 DIAGNOSIS — Z8673 Personal history of transient ischemic attack (TIA), and cerebral infarction without residual deficits: Secondary | ICD-10-CM

## 2015-06-12 DIAGNOSIS — R9439 Abnormal result of other cardiovascular function study: Secondary | ICD-10-CM | POA: Diagnosis not present

## 2015-06-12 DIAGNOSIS — R0602 Shortness of breath: Secondary | ICD-10-CM

## 2015-06-12 DIAGNOSIS — F17210 Nicotine dependence, cigarettes, uncomplicated: Secondary | ICD-10-CM | POA: Diagnosis present

## 2015-06-12 DIAGNOSIS — R0789 Other chest pain: Secondary | ICD-10-CM | POA: Diagnosis not present

## 2015-06-12 DIAGNOSIS — D62 Acute posthemorrhagic anemia: Secondary | ICD-10-CM | POA: Diagnosis not present

## 2015-06-12 DIAGNOSIS — Z720 Tobacco use: Secondary | ICD-10-CM | POA: Diagnosis present

## 2015-06-12 DIAGNOSIS — E119 Type 2 diabetes mellitus without complications: Secondary | ICD-10-CM | POA: Diagnosis present

## 2015-06-12 DIAGNOSIS — I25110 Atherosclerotic heart disease of native coronary artery with unstable angina pectoris: Secondary | ICD-10-CM | POA: Diagnosis present

## 2015-06-12 DIAGNOSIS — I2510 Atherosclerotic heart disease of native coronary artery without angina pectoris: Secondary | ICD-10-CM

## 2015-06-12 DIAGNOSIS — Z833 Family history of diabetes mellitus: Secondary | ICD-10-CM | POA: Diagnosis not present

## 2015-06-12 DIAGNOSIS — I214 Non-ST elevation (NSTEMI) myocardial infarction: Secondary | ICD-10-CM | POA: Diagnosis present

## 2015-06-12 DIAGNOSIS — I10 Essential (primary) hypertension: Secondary | ICD-10-CM | POA: Diagnosis present

## 2015-06-12 DIAGNOSIS — R079 Chest pain, unspecified: Secondary | ICD-10-CM

## 2015-06-12 DIAGNOSIS — Z8619 Personal history of other infectious and parasitic diseases: Secondary | ICD-10-CM | POA: Diagnosis not present

## 2015-06-12 DIAGNOSIS — E1165 Type 2 diabetes mellitus with hyperglycemia: Secondary | ICD-10-CM | POA: Diagnosis not present

## 2015-06-12 DIAGNOSIS — Z6833 Body mass index (BMI) 33.0-33.9, adult: Secondary | ICD-10-CM

## 2015-06-12 DIAGNOSIS — E8770 Fluid overload, unspecified: Secondary | ICD-10-CM | POA: Diagnosis not present

## 2015-06-12 DIAGNOSIS — M069 Rheumatoid arthritis, unspecified: Secondary | ICD-10-CM | POA: Diagnosis present

## 2015-06-12 DIAGNOSIS — I080 Rheumatic disorders of both mitral and aortic valves: Secondary | ICD-10-CM | POA: Diagnosis not present

## 2015-06-12 DIAGNOSIS — Z794 Long term (current) use of insulin: Secondary | ICD-10-CM | POA: Diagnosis present

## 2015-06-12 DIAGNOSIS — M329 Systemic lupus erythematosus, unspecified: Secondary | ICD-10-CM | POA: Diagnosis present

## 2015-06-12 DIAGNOSIS — Z951 Presence of aortocoronary bypass graft: Secondary | ICD-10-CM

## 2015-06-12 DIAGNOSIS — K5900 Constipation, unspecified: Secondary | ICD-10-CM | POA: Diagnosis not present

## 2015-06-12 DIAGNOSIS — R52 Pain, unspecified: Secondary | ICD-10-CM

## 2015-06-12 DIAGNOSIS — E669 Obesity, unspecified: Secondary | ICD-10-CM | POA: Diagnosis present

## 2015-06-12 DIAGNOSIS — J811 Chronic pulmonary edema: Secondary | ICD-10-CM | POA: Diagnosis not present

## 2015-06-12 DIAGNOSIS — J9811 Atelectasis: Secondary | ICD-10-CM | POA: Diagnosis not present

## 2015-06-12 DIAGNOSIS — E1121 Type 2 diabetes mellitus with diabetic nephropathy: Secondary | ICD-10-CM

## 2015-06-12 DIAGNOSIS — E785 Hyperlipidemia, unspecified: Secondary | ICD-10-CM | POA: Diagnosis present

## 2015-06-12 DIAGNOSIS — M47812 Spondylosis without myelopathy or radiculopathy, cervical region: Secondary | ICD-10-CM | POA: Diagnosis not present

## 2015-06-12 DIAGNOSIS — Z7984 Long term (current) use of oral hypoglycemic drugs: Secondary | ICD-10-CM

## 2015-06-12 HISTORY — DX: Calculus of kidney: N20.0

## 2015-06-12 HISTORY — DX: Rheumatoid arthritis, unspecified: M06.9

## 2015-06-12 HISTORY — DX: Other specified postprocedural states: Z98.890

## 2015-06-12 HISTORY — DX: Tobacco use: Z72.0

## 2015-06-12 HISTORY — DX: Type 2 diabetes mellitus without complications: E11.9

## 2015-06-12 HISTORY — DX: Pneumonia, unspecified organism: J18.9

## 2015-06-12 HISTORY — DX: Nausea with vomiting, unspecified: R11.2

## 2015-06-12 HISTORY — DX: Anxiety disorder, unspecified: F41.9

## 2015-06-12 LAB — COMPREHENSIVE METABOLIC PANEL
ALT: 24 U/L (ref 14–54)
AST: 26 U/L (ref 15–41)
Albumin: 3.8 g/dL (ref 3.5–5.0)
Alkaline Phosphatase: 88 U/L (ref 38–126)
Anion gap: 11 (ref 5–15)
BUN: 11 mg/dL (ref 6–20)
CO2: 26 mmol/L (ref 22–32)
Calcium: 10.1 mg/dL (ref 8.9–10.3)
Chloride: 103 mmol/L (ref 101–111)
Creatinine, Ser: 0.76 mg/dL (ref 0.44–1.00)
GFR calc Af Amer: >60 mL/min (ref >60)
GFR calc non Af Amer: >60 mL/min (ref >60)
Glucose, Bld: 171 mg/dL — ABNORMAL HIGH (ref 65–99)
Potassium: 4 mmol/L (ref 3.5–5.1)
Sodium: 140 mmol/L (ref 135–145)
Total Bilirubin: 0.6 mg/dL (ref 0.3–1.2)
Total Protein: 8.1 g/dL (ref 6.5–8.1)

## 2015-06-12 LAB — LIPID PANEL
Cholesterol: 308 mg/dL — ABNORMAL HIGH (ref 0–200)
HDL: 52 mg/dL (ref >40)
LDL Cholesterol: 197 mg/dL — ABNORMAL HIGH (ref 0–99)
Total CHOL/HDL Ratio: 5.9 RATIO
Triglycerides: 296 mg/dL — ABNORMAL HIGH (ref <150)
VLDL: 59 mg/dL — ABNORMAL HIGH (ref 0–40)

## 2015-06-12 LAB — CBC
HCT: 48.9 % — ABNORMAL HIGH (ref 36.0–46.0)
Hemoglobin: 16.2 g/dL — ABNORMAL HIGH (ref 12.0–15.0)
MCH: 29.7 pg (ref 26.0–34.0)
MCHC: 33.1 g/dL (ref 30.0–36.0)
MCV: 89.7 fL (ref 78.0–100.0)
Platelets: 320 10*3/uL (ref 150–400)
RBC: 5.45 MIL/uL — ABNORMAL HIGH (ref 3.87–5.11)
RDW: 15.2 % (ref 11.5–15.5)
WBC: 9.6 10*3/uL (ref 4.0–10.5)

## 2015-06-12 LAB — I-STAT TROPONIN, ED
Troponin i, poc: 0.02 ng/mL (ref 0.00–0.08)
Troponin i, poc: 0.74 ng/mL (ref 0.00–0.08)

## 2015-06-12 LAB — TROPONIN I: Troponin I: 0.79 ng/mL (ref <0.031)

## 2015-06-12 LAB — GLUCOSE, CAPILLARY
Glucose-Capillary: 167 mg/dL — ABNORMAL HIGH (ref 65–99)
Glucose-Capillary: 171 mg/dL — ABNORMAL HIGH (ref 65–99)

## 2015-06-12 MED ORDER — GI COCKTAIL ~~LOC~~
30.0000 mL | Freq: Once | ORAL | Status: AC
  Administered 2015-06-12: 30 mL via ORAL

## 2015-06-12 MED ORDER — SIMVASTATIN 40 MG PO TABS
40.0000 mg | ORAL_TABLET | Freq: Every day | ORAL | Status: DC
  Administered 2015-06-12 – 2015-06-13 (×2): 40 mg via ORAL
  Filled 2015-06-12 (×2): qty 1

## 2015-06-12 MED ORDER — GI COCKTAIL ~~LOC~~
30.0000 mL | Freq: Once | ORAL | Status: DC
  Filled 2015-06-12: qty 30

## 2015-06-12 MED ORDER — MORPHINE SULFATE (PF) 2 MG/ML IV SOLN: 2.0000 mg | INTRAVENOUS | Status: DC | PRN

## 2015-06-12 MED ORDER — INSULIN ASPART 100 UNIT/ML ~~LOC~~ SOLN: 0.0000 [IU] | Freq: Every day | SUBCUTANEOUS | Status: DC

## 2015-06-12 MED ORDER — GERITOL COMPLETE PO TABS: ORAL_TABLET | Freq: Every day | ORAL | Status: DC

## 2015-06-12 MED ORDER — CANAGLIFLOZIN 300 MG PO TABS
300.0000 mg | ORAL_TABLET | Freq: Every day | ORAL | Status: DC
  Administered 2015-06-14 – 2015-06-18 (×5): 300 mg via ORAL
  Filled 2015-06-12 (×15): qty 1

## 2015-06-12 MED ORDER — ENOXAPARIN SODIUM 40 MG/0.4ML ~~LOC~~ SOLN: 40.0000 mg | SUBCUTANEOUS | Status: DC

## 2015-06-12 MED ORDER — INSULIN ASPART 100 UNIT/ML ~~LOC~~ SOLN: 0.0000 [IU] | Freq: Three times a day (TID) | SUBCUTANEOUS | Status: DC

## 2015-06-12 MED ORDER — OLMESARTAN-AMLODIPINE-HCTZ 40-10-25 MG PO TABS: 1.0000 | ORAL_TABLET | Freq: Every day | ORAL | Status: DC

## 2015-06-12 MED ORDER — ASPIRIN 81 MG PO CHEW
324.0000 mg | CHEWABLE_TABLET | Freq: Once | ORAL | Status: DC
Start: 2015-06-12 — End: 2015-06-19
  Filled 2015-06-12: qty 4

## 2015-06-12 MED ORDER — ASPIRIN 325 MG PO TABS
325.0000 mg | ORAL_TABLET | Freq: Every day | ORAL | Status: DC
Start: 2015-06-13 — End: 2015-06-13

## 2015-06-12 MED ORDER — AMLODIPINE BESYLATE 10 MG PO TABS
10.0000 mg | ORAL_TABLET | Freq: Every day | ORAL | Status: DC
  Administered 2015-06-13 – 2015-06-14 (×2): 10 mg via ORAL
  Filled 2015-06-12 (×2): qty 1

## 2015-06-12 MED ORDER — ONDANSETRON HCL 4 MG/2ML IJ SOLN: 4.0000 mg | Freq: Four times a day (QID) | INTRAMUSCULAR | Status: DC | PRN

## 2015-06-12 MED ORDER — TAB-A-VITE/IRON PO TABS
1.0000 | ORAL_TABLET | Freq: Every day | ORAL | Status: DC
  Administered 2015-06-13 – 2015-06-18 (×6): 1 via ORAL
  Filled 2015-06-12 (×14): qty 1

## 2015-06-12 MED ORDER — ACETAMINOPHEN 325 MG PO TABS: 650.0000 mg | ORAL_TABLET | ORAL | Status: DC | PRN

## 2015-06-12 MED ORDER — CLOPIDOGREL BISULFATE 75 MG PO TABS: 75.0000 mg | ORAL_TABLET | Freq: Every day | ORAL | Status: DC

## 2015-06-12 MED ORDER — NITROGLYCERIN 0.4 MG SL SUBL: 0.4000 mg | SUBLINGUAL_TABLET | SUBLINGUAL | Status: DC | PRN

## 2015-06-12 MED ORDER — IRBESARTAN 150 MG PO TABS: 300.0000 mg | ORAL_TABLET | Freq: Every day | ORAL | Status: DC

## 2015-06-12 MED ORDER — GI COCKTAIL ~~LOC~~
30.0000 mL | Freq: Four times a day (QID) | ORAL | Status: DC | PRN
Start: 2015-06-12 — End: 2015-06-19
  Filled 2015-06-12 (×2): qty 30

## 2015-06-12 MED ORDER — HYDROCHLOROTHIAZIDE 25 MG PO TABS: 25.0000 mg | ORAL_TABLET | Freq: Every day | ORAL | Status: DC

## 2015-06-12 NOTE — Progress Notes (Signed)
2045 Provider on call paged to inform of (+) Troponin-not clearly noted that MD's are aware.  Pt also had short run of NSVT on telemetry.  Upon assessment pt c/o mild indigestion BP 132/68 EKG obtained with no acute changes noted.GI cocktail given per orders. Provider paged and informed EKG was available to view in EPIC. GI cocktail effective and pt reports indigestion resolved. Will continue to monitor. Jessie Foot, RN

## 2015-06-12 NOTE — ED Provider Notes (Signed)
CSN: [id number]     Arrival date & time 06/12/15  1113 History   First MD Initiated Contact with Patient 06/12/15 1125     Chief Complaint  Patient presents with  . Chest Pain     (Consider location/radiation/quality/duration/timing/severity/associated sxs/prior Treatment) HPI Comments: Hx of chest pains/gas pains  Started bilateral jaws to throat, choking sensation, can't pinpoint where it comes from Squeezing pain  Radiate to right arm and left arm This time got worse, usually go somewhere quiet and it will ease off, however this time would not Stayed around Had just walked out to take out trash when pain started Pain not exertional however Pain better with nitro Cough for 3 weeks, neck pain with cough. Cp with cough as well, separate pain then pain described earlier..  No nausea,  No shortness of breath, no sweating  A few years of intermittent CP. Stroke in 2013. DM, possible lupus, htn, hlpd  Left arm still feels funny, mild pain Also notes stress, anxiety since husband passed away in 08/22/22   Patient is a 55 y.o. female presenting with chest pain.  Chest Pain Associated symptoms: cough   Associated symptoms: no abdominal pain, no back pain, no diaphoresis (all the itme no change), no fever, no headache, no nausea, no shortness of breath and not vomiting     Past Medical History  Diagnosis Date  . Arthritis   . Hypertension   . Lupus ([place])   . 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 Empty sella ([place])   . Hidradenitis axillaris   . Obesity (BMI 30.0-34.9)   . Hepatitis C   . Renal disorder     kidney stones  . Ovarian cyst   . CVA (cerebral infarction)   . Scoliosis   . Diabetes mellitus without complication ([place])   . Hyperlipemia   . Anxiety and depression   . Stroke [facility]) March 2014    left MCA branches  . Tobacco abuse    Past Surgical History  Procedure Laterality Date  . Abdominal hysterectomy    . Breast biopsy     Family History  Problem Relation Age of  Onset  . Diabetes Mother   . Heart failure Mother   . Stroke Maternal Aunt   . Diabetes      maternal  . Breast cancer    . Alzheimer's disease      maternal   Social History  Substance Use Topics  . Smoking status: Current Every Day Smoker -- 0.25 packs/day    Types: Cigarettes  . Smokeless tobacco: None     Comment: Attempting to quit, currently smokes about 1/5 to 1/4 pack per day.  . Alcohol Use: No   OB History    No data available     Review of Systems  Constitutional: Negative for fever and diaphoresis (all the itme no change).  HENT: Negative for sore throat.   Eyes: Negative for visual disturbance.  Respiratory: Positive for cough. Negative for shortness of breath.   Cardiovascular: Positive for chest pain.  Gastrointestinal: Negative for nausea, vomiting, abdominal pain and diarrhea.  Genitourinary: Negative for difficulty urinating.  Musculoskeletal: Negative for back pain and neck pain.  Skin: Negative for rash.  Neurological: Negative for syncope, light-headedness and headaches.      Allergies  Review of patient's allergies indicates no known allergies.  Home Medications   Prior to Admission medications   Medication Sig Start Date End Date Taking? Authorizing Provider  Biotin 1 MG CAPS Take  1 mg by mouth daily.   Yes Historical Provider, MD  Cholecalciferol (VITAMIN D3) 50000 units CAPS Take 50,000 Units by mouth once a week. 04/09/15  Yes Historical Provider, MD  clopidogrel (PLAVIX) 75 MG tablet Take 75 mg by mouth at bedtime. 07/06/12  Yes Vikram R Penumalli, MD  INVOKANA 300 MG TABS tablet Take 300 mg by mouth daily. 06/05/15  Yes Historical Provider, MD  Iron-Vitamins (GERITOL COMPLETE PO) Take 1 tablet by mouth daily.   Yes Historical Provider, MD  JANUVIA 100 MG tablet Take 100 mg by mouth daily. 04/06/15  Yes Historical Provider, MD  metFORMIN (GLUCOPHAGE) 1000 MG tablet Take 1,000 mg by mouth 2 (two) times daily. 04/06/15  Yes Historical Provider, MD   Olmesartan-Amlodipine-HCTZ (TRIBENZOR) 40-10-25 MG TABS Take 1 tablet by mouth daily.   Yes Historical Provider, MD  simvastatin (ZOCOR) 40 MG tablet Take 40 mg by mouth daily. 04/10/15  Yes Historical Provider, MD  metFORMIN (GLUCOPHAGE) 500 MG tablet Take 1 tablet (500 mg total) by mouth 2 (two) times daily with a meal. Patient not taking: Reported on 06/12/2015 06/05/12   Charlynne Cousins, MD  pravastatin (PRAVACHOL) 40 MG tablet Take 1 tablet (40 mg total) by mouth at bedtime. Patient not taking: Reported on 06/12/2015 10/05/12   Philmore Pali, NP   BP 133/86 mmHg  Pulse 83  Temp(Src) 98.9 F (37.2 C) (Oral)  Resp 16  Ht 5\' 7"  (1.702 m)  Wt 214 lb 3.2 oz (97.16 kg)  BMI 33.54 kg/m2  SpO2 100% Physical Exam  Constitutional: She is oriented to person, place, and time. She appears well-developed and well-nourished. No distress.  HENT:  Head: Normocephalic and atraumatic.  Eyes: Conjunctivae and EOM are normal.  Neck: Normal range of motion.  Cardiovascular: Normal rate, regular rhythm, normal heart sounds and intact distal pulses.  Exam reveals no gallop and no friction rub.   No murmur heard. Pulmonary/Chest: Effort normal and breath sounds normal. No respiratory distress. She has no wheezes. She has no rales. She exhibits tenderness.  Abdominal: Soft. She exhibits no distension. There is no tenderness. There is no guarding.  Musculoskeletal: She exhibits no edema or tenderness.  Neurological: She is alert and oriented to person, place, and time.  Skin: Skin is warm and dry. No rash noted. She is not diaphoretic. No erythema.  Nursing note and vitals reviewed.   ED Course  Procedures (including critical care time) Labs Review Labs Reviewed  CBC - Abnormal; Notable for the following:    RBC 5.45 (*)    Hemoglobin 16.2 (*)    HCT 48.9 (*)    All other components within normal limits  COMPREHENSIVE METABOLIC PANEL - Abnormal; Notable for the following:    Glucose, Bld 171 (*)     All other components within normal limits  LIPID PANEL - Abnormal; Notable for the following:    Cholesterol 308 (*)    Triglycerides 296 (*)    VLDL 59 (*)    LDL Cholesterol 197 (*)    All other components within normal limits  GLUCOSE, CAPILLARY - Abnormal; Notable for the following:    Glucose-Capillary 167 (*)    All other components within normal limits  I-STAT TROPOININ, ED - Abnormal; Notable for the following:    Troponin i, poc 0.74 (*)    All other components within normal limits  HEMOGLOBIN A1C  TROPONIN I  TROPONIN I  I-STAT TROPOININ, ED  I-STAT TROPOININ, ED    Imaging Review  Dg Chest 2 View  06/12/2015  CLINICAL DATA:  Sharp mid chest pain radiating into the right arm EXAM: CHEST  2 VIEW COMPARISON:  04/11/2012 FINDINGS: There is mild right middle lobe atelectasis. There is no focal parenchymal opacity. There is no pleural effusion or pneumothorax. The heart and mediastinal contours are unremarkable. The osseous structures are unremarkable. IMPRESSION: No active cardiopulmonary disease. Electronically Signed   By: Kathreen Devoid   On: 06/12/2015 13:00   Dg Cervical Spine With Flex & Extend  06/12/2015  CLINICAL DATA:  Neck pain and bilateral hand numbness and tingling for 2 weeks with no known injury EXAM: CERVICAL SPINE COMPLETE WITH FLEXION AND EXTENSION VIEWS COMPARISON:  None. FINDINGS: Normal alignment with no prevertebral soft tissue swelling. No fracture. There is degenerative change at the atlantodental articulation. Moderate C4-5, C5-6, and C6-7 degenerative disc disease with mild degenerative disc disease at C7-T1. Prominent anterior osteophytes are noted at C4-5, C5-6, and C6-7. There is mild C5-6 and C6-7 facet arthropathy. There is no significant foraminal narrowing. IMPRESSION: Degenerative changes with no acute findings Electronically Signed   By: Skipper Cliche M.D.   On: 06/12/2015 15:08   I have personally reviewed and evaluated these images and lab  results as part of my medical decision-making.   EKG Interpretation   Date/Time:  Tuesday June 12 2015 11:18:48 EDT Ventricular Rate:  78 PR Interval:  186 QRS Duration: 98 QT Interval:  392 QTC Calculation: 446 R Axis:   -83 Text Interpretation:  Normal sinus rhythm Left axis deviation Incomplete  right bundle branch block Cannot rule out Anterior infarct , age  undetermined Abnormal ECG No significant change since last tracing  Confirmed by [facility] MD, Samreen Seltzer ([id number]) on 06/12/2015 1:13:56 PM      MDM   Final diagnoses:  Chest pain, unspecified chest pain type    55 year old female with a history of diabetes, hypertension, hyperlipidemia, stroke, possible lupus, possible rheumatoid arthritis presents with concern for chest pain with radiation to the jaw and bilateral arms. EKG shows incomplete right bundle branch block, without significant change from prior. Initial troponin is negative. Patient without dyspnea, no shortness of breath, and have low suspicion for pulmonary embolus. She has equal pulses bilaterally, no findings to suggest dissection on x-ray, and have low suspicion for this based off of history and physical exam.  Patient was given aspirin, and reported improvement of pain with nitroglycerin. Her HEART score is a 6. We will admit to hospitalist medicine for further chest pain evaluation.   [name], MD 06/12/15 [phone]

## 2015-06-12 NOTE — ED Notes (Addendum)
Pt had sharp cp this am at 10 am rads to rt arm no n/v/sob/ sweating on plavix for TIA has iv left wrist 20 1 nitro that helped but no asa die to plavix

## 2015-06-12 NOTE — H&P (Signed)
[facility] History and Physical  [name] [name] [id number] DOB: [birth year] DOA: 06/12/2015  Referring physician: Billy Fischer PCP: Elyn Peers, MD   Chief Complaint: chest pain  HPI: [name] [name] is a 55 y.o. female with a past medical history that includes obesity diabetes, hypertension, previous stroke, hepatitis C, rheumatoid arthritis presents to emergency Department chief complaint of shortness of breath. Initial evaluation concerning for ACS.  Information is obtained from the patient. She reports chronic general pain related to rheumatoid arthritis but states intermittent "different" pain over the last 3-4 days. She reports right anterior chest pain radiating to bilateral jaw associated with a feeling of fullness in her throat worse with cough. In addition she reports bilateral hand numbness and tingling. She denies any upper extremity weakness. She denies any recent falls or history neck injury. She does report a history of degenerative disc disease. She denies any shortness of breath but does report intermittent nonproductive cough. She denies abdominal pain nausea vomiting lower extremity edema or orthopnea. She denies any diaphoresis visual disturbances syncope or near-syncope. She denies any dysuria hematuria frequency or urgency. She denies any constipation diarrhea melena or bright red blood per rectum.  In the emergency department she's afebrile hemodynamically stable and not hypoxic  Review of Systems:  10 point review of systems complete and all systems are negative except as indicated in the history of present illness   Past Medical History  Diagnosis Date  . Arthritis   . Hypertension   . Lupus ([place])   . 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 Empty sella ([place])   . Hidradenitis axillaris   . Obesity (BMI 30.0-34.9)   . Hepatitis C   . Renal disorder     kidney stones  . Ovarian cyst   . CVA (cerebral infarction)   . Scoliosis   . Diabetes mellitus without  complication ([place])   . Hyperlipemia   . Anxiety and depression   . Stroke [facility]) March 2014    left MCA branches  . Tobacco abuse    Past Surgical History  Procedure Laterality Date  . Abdominal hysterectomy    . Breast biopsy     Social History:  reports that she has been smoking Cigarettes.  She has been smoking about 0.25 packs per day. She does not have any smokeless tobacco history on file. She reports that she does not drink alcohol or use illicit drugs. She is currently unemployed she lives at home with her son she is independent with ADLs No Known Allergies  Family History  Problem Relation Age of Onset  . Diabetes Mother   . Heart failure Mother   . Stroke Maternal Aunt   . Diabetes      maternal  . Breast cancer    . Alzheimer's disease      maternal     Prior to Admission medications   Medication Sig Start Date End Date Taking? Authorizing Provider  Biotin 1 MG CAPS Take 1 mg by mouth daily.   Yes Historical Provider, MD  Cholecalciferol (VITAMIN D3) 50000 units CAPS Take 50,000 Units by mouth once a week. 04/09/15  Yes Historical Provider, MD  clopidogrel (PLAVIX) 75 MG tablet Take 75 mg by mouth at bedtime. 07/06/12  Yes Vikram R Penumalli, MD  INVOKANA 300 MG TABS tablet Take 300 mg by mouth daily. 06/05/15  Yes Historical Provider, MD  Iron-Vitamins (GERITOL COMPLETE PO) Take 1 tablet by mouth daily.   Yes Historical Provider, MD  JANUVIA 100 MG  tablet Take 100 mg by mouth daily. 04/06/15  Yes Historical Provider, MD  metFORMIN (GLUCOPHAGE) 1000 MG tablet Take 1,000 mg by mouth 2 (two) times daily. 04/06/15  Yes Historical Provider, MD  Olmesartan-Amlodipine-HCTZ (TRIBENZOR) 40-10-25 MG TABS Take 1 tablet by mouth daily.   Yes Historical Provider, MD  simvastatin (ZOCOR) 40 MG tablet Take 40 mg by mouth daily. 04/10/15  Yes Historical Provider, MD  metFORMIN (GLUCOPHAGE) 500 MG tablet Take 1 tablet (500 mg total) by mouth 2 (two) times daily with a meal. Patient not  taking: Reported on 06/12/2015 06/05/12   Charlynne Cousins, MD  pravastatin (PRAVACHOL) 40 MG tablet Take 1 tablet (40 mg total) by mouth at bedtime. Patient not taking: Reported on 06/12/2015 10/05/12   Philmore Pali, NP   Physical Exam: Filed Vitals:   06/12/15 1119 06/12/15 1215 06/12/15 1253 06/12/15 1330  BP: 142/80 152/94 146/88 131/82  Pulse: 84 83 83 83  Temp: 97.7 F (36.5 C)     TempSrc: Oral     Resp: 20 18 17 15   SpO2: 100% 99% 100% 100%    Wt Readings from Last 3 Encounters:  11/17/12 99.474 kg (219 lb 4.8 oz)  10/05/12 100.245 kg (221 lb)  07/06/12 101.606 kg (224 lb)    General:  Appears calm and comfortable, obese Eyes: PERRL, normal lids, irises & conjunctiva ENT: grossly normal hearing, lips & tongue, mucous membranes of her mouth are moist and pink Neck: no LAD, masses or thyromegaly, full range of motion Cardiovascular: RRR, no m/r/g. No LE edema. Respiratory: CTA bilaterally, no w/r/r. Normal respiratory effort. Abdomen: soft, ntnd obese positive bowel sounds no guarding or rebounding Skin: no rash or induration seen on limited exam Musculoskeletal: grossly normal tone BUE/BLE Psychiatric: grossly normal mood and affect, speech fluent and appropriate Neurologic: grossly non-focal. Alert and oriented cranial nerves II through XII           Labs on Admission:  Basic Metabolic Panel:  Recent Labs Lab 06/12/15 1231  NA 140  K 4.0  CL 103  CO2 26  GLUCOSE 171*  BUN 11  CREATININE 0.76  CALCIUM 10.1   Liver Function Tests:  Recent Labs Lab 06/12/15 1231  AST 26  ALT 24  ALKPHOS 88  BILITOT 0.6  PROT 8.1  ALBUMIN 3.8   No results for input(s): LIPASE, AMYLASE in the last 168 hours. No results for input(s): AMMONIA in the last 168 hours. CBC:  Recent Labs Lab 06/12/15 1231  WBC 9.6  HGB 16.2*  HCT 48.9*  MCV 89.7  PLT 320   Cardiac Enzymes: No results for input(s): CKTOTAL, CKMB, CKMBINDEX, TROPONINI in the last 168 hours.  BNP  (last 3 results) No results for input(s): BNP in the last 8760 hours.  ProBNP (last 3 results) No results for input(s): PROBNP in the last 8760 hours.  CBG: No results for input(s): GLUCAP in the last 168 hours.  Radiological Exams on Admission: Dg Chest 2 View  06/12/2015  CLINICAL DATA:  Sharp mid chest pain radiating into the right arm EXAM: CHEST  2 VIEW COMPARISON:  04/11/2012 FINDINGS: There is mild right middle lobe atelectasis. There is no focal parenchymal opacity. There is no pleural effusion or pneumothorax. The heart and mediastinal contours are unremarkable. The osseous structures are unremarkable. IMPRESSION: No active cardiopulmonary disease. Electronically Signed   By: Kathreen Devoid   On: 06/12/2015 13:00    EKG: Independently reviewed. Normal sinus rhythm Left axis deviation Incomplete right  bundle branch block   Assessment/Plan Principal Problem:   Chest pain at rest Active Problems:   CVA (cerebral infarction)   HTN (hypertension)   Obesity (BMI 30.0-34.9)   Diabetes ([place])   Tobacco abuse  1. Chest pain at rest. Pain quite atypical. Patient with heart score 6. Risk factors include obesity, diabetes, hypertension, lipidemia. Initial troponin negative. EKG sinus rhythm right incomplete complete bundle branch block. Patient pain-free on admission. -Admit to telemetry -Cycle troponin -Serial EKG -Obtain a lipid panel -Provide nitroglycerin and GI cocktail -Continue aspirin and statin -Obtain flexion-extension C-spine film -Patient would likely benefit from a stress test but reports she has appointment with PCP April 11 and would like to arrange outpatient. If she rules out recommend considering this strategy  #2. Diabetes. Patient on oral agents. Reports compliance. Serum glucose 171 on admission -We will obtain a hemoglobin A1c -Hold oral agents for now -Use sliding scale insulin for optimal control.  3.Hypertension. Blood pressure controlled in the emergency  department. Home medications include HCTZ, amlodipine -Continue home medications -Monitor closely  #4. CVA. History of same. Recent on Plavix. No deficits  5. Tobacco use. Cessation counseling offered   Code Status: full DVT Prophylaxis: Family Communication: son at bedside Disposition Plan: home likely 24 hours  Time spent: 63 minutes  [facility]

## 2015-06-13 ENCOUNTER — Encounter (HOSPITAL_COMMUNITY)
Admission: EM | Disposition: A | Discharge status: Home / Self Care | Payer: Self-pay | Source: Home / Self Care | Attending: Internal Medicine

## 2015-06-13 ENCOUNTER — Encounter (HOSPITAL_COMMUNITY): Payer: Self-pay | Admitting: Cardiology

## 2015-06-13 DIAGNOSIS — Z720 Tobacco use: Secondary | ICD-10-CM

## 2015-06-13 DIAGNOSIS — M069 Rheumatoid arthritis, unspecified: Secondary | ICD-10-CM

## 2015-06-13 DIAGNOSIS — E119 Type 2 diabetes mellitus without complications: Secondary | ICD-10-CM | POA: Diagnosis not present

## 2015-06-13 DIAGNOSIS — R9439 Abnormal result of other cardiovascular function study: Secondary | ICD-10-CM | POA: Diagnosis not present

## 2015-06-13 DIAGNOSIS — Z8673 Personal history of transient ischemic attack (TIA), and cerebral infarction without residual deficits: Secondary | ICD-10-CM | POA: Diagnosis not present

## 2015-06-13 DIAGNOSIS — I25110 Atherosclerotic heart disease of native coronary artery with unstable angina pectoris: Secondary | ICD-10-CM | POA: Diagnosis not present

## 2015-06-13 DIAGNOSIS — E785 Hyperlipidemia, unspecified: Secondary | ICD-10-CM | POA: Diagnosis not present

## 2015-06-13 DIAGNOSIS — I2510 Atherosclerotic heart disease of native coronary artery without angina pectoris: Secondary | ICD-10-CM

## 2015-06-13 DIAGNOSIS — I10 Essential (primary) hypertension: Secondary | ICD-10-CM

## 2015-06-13 DIAGNOSIS — M329 Systemic lupus erythematosus, unspecified: Secondary | ICD-10-CM

## 2015-06-13 DIAGNOSIS — E1121 Type 2 diabetes mellitus with diabetic nephropathy: Secondary | ICD-10-CM

## 2015-06-13 DIAGNOSIS — I214 Non-ST elevation (NSTEMI) myocardial infarction: Secondary | ICD-10-CM | POA: Diagnosis not present

## 2015-06-13 DIAGNOSIS — R079 Chest pain, unspecified: Secondary | ICD-10-CM | POA: Diagnosis not present

## 2015-06-13 DIAGNOSIS — E1165 Type 2 diabetes mellitus with hyperglycemia: Secondary | ICD-10-CM

## 2015-06-13 HISTORY — PX: CARDIAC CATHETERIZATION: SHX172

## 2015-06-13 LAB — TSH: TSH: 0.943 u[IU]/mL (ref 0.350–4.500)

## 2015-06-13 LAB — GLUCOSE, CAPILLARY
Glucose-Capillary: 187 mg/dL — ABNORMAL HIGH (ref 65–99)
Glucose-Capillary: 156 mg/dL — ABNORMAL HIGH (ref 65–99)
Glucose-Capillary: 126 mg/dL — ABNORMAL HIGH (ref 65–99)
Glucose-Capillary: 191 mg/dL — ABNORMAL HIGH (ref 65–99)

## 2015-06-13 LAB — PROTIME-INR
INR: 1.14 (ref 0.00–1.49)
Prothrombin Time: 14.8 seconds (ref 11.6–15.2)

## 2015-06-13 LAB — D-DIMER, QUANTITATIVE (NOT AT ARMC): D-Dimer, Quant: <0.27 ug/mL-FEU (ref 0.00–0.50)

## 2015-06-13 LAB — HEMOGLOBIN A1C
Hgb A1c MFr Bld: 8.9 % — ABNORMAL HIGH (ref 4.8–5.6)
Mean Plasma Glucose: 209 mg/dL

## 2015-06-13 LAB — TROPONIN I: Troponin I: 0.75 ng/mL (ref <0.031)

## 2015-06-13 SURGERY — LEFT HEART CATH AND CORONARY ANGIOGRAPHY

## 2015-06-13 MED ORDER — HEPARIN SODIUM (PORCINE) 1000 UNIT/ML IJ SOLN
INTRAMUSCULAR | Status: DC | PRN
  Administered 2015-06-13: 5000 [IU] via INTRAVENOUS

## 2015-06-13 MED ORDER — LIDOCAINE HCL (PF) 1 % IJ SOLN
INTRAMUSCULAR | Status: DC | PRN
  Administered 2015-06-13: 4 mL

## 2015-06-13 MED ORDER — IOPAMIDOL (ISOVUE-370) INJECTION 76%
INTRAVENOUS | Status: DC | PRN
  Administered 2015-06-13: 95 mL via INTRAVENOUS

## 2015-06-13 MED ORDER — HEPARIN (PORCINE) IN NACL 2-0.9 UNIT/ML-% IJ SOLN
INTRAMUSCULAR | Status: DC | PRN
  Administered 2015-06-13: 1000 mL

## 2015-06-13 MED ORDER — VERAPAMIL HCL 2.5 MG/ML IV SOLN
INTRAVENOUS | Status: AC
  Filled 2015-06-13: qty 2

## 2015-06-13 MED ORDER — HEPARIN SODIUM (PORCINE) 1000 UNIT/ML IJ SOLN
INTRAMUSCULAR | Status: AC
  Filled 2015-06-13: qty 1

## 2015-06-13 MED ORDER — IOPAMIDOL (ISOVUE-370) INJECTION 76%
INTRAVENOUS | Status: AC
  Filled 2015-06-13: qty 50

## 2015-06-13 MED ORDER — SODIUM CHLORIDE 0.9% FLUSH
3.0000 mL | Freq: Two times a day (BID) | INTRAVENOUS | Status: DC
  Administered 2015-06-13: 3 mL via INTRAVENOUS

## 2015-06-13 MED ORDER — DOXYCYCLINE HYCLATE 100 MG PO TABS
100.0000 mg | ORAL_TABLET | Freq: Two times a day (BID) | ORAL | Status: DC
  Administered 2015-06-13 – 2015-06-18 (×12): 100 mg via ORAL
  Filled 2015-06-13 (×12): qty 1

## 2015-06-13 MED ORDER — SODIUM CHLORIDE 0.9 % IV SOLN
INTRAVENOUS | Status: DC
Start: 2015-06-13 — End: 2015-06-13
  Administered 2015-06-13: 10:00:00 via INTRAVENOUS

## 2015-06-13 MED ORDER — MOMETASONE FURO-FORMOTEROL FUM 200-5 MCG/ACT IN AERO
2.0000 | INHALATION_SPRAY | Freq: Two times a day (BID) | RESPIRATORY_TRACT | Status: DC
  Administered 2015-06-14 – 2015-06-18 (×9): 2 via RESPIRATORY_TRACT
  Filled 2015-06-13: qty 8.8

## 2015-06-13 MED ORDER — IRBESARTAN 150 MG PO TABS
150.0000 mg | ORAL_TABLET | Freq: Every day | ORAL | Status: DC
  Administered 2015-06-13 – 2015-06-18 (×6): 150 mg via ORAL
  Filled 2015-06-13 (×6): qty 1

## 2015-06-13 MED ORDER — LIDOCAINE HCL (PF) 1 % IJ SOLN
INTRAMUSCULAR | Status: AC
  Filled 2015-06-13: qty 30

## 2015-06-13 MED ORDER — FENTANYL CITRATE (PF) 100 MCG/2ML IJ SOLN
INTRAMUSCULAR | Status: DC | PRN
  Administered 2015-06-13 (×3): 25 ug via INTRAVENOUS

## 2015-06-13 MED ORDER — ASPIRIN 81 MG PO CHEW
81.0000 mg | CHEWABLE_TABLET | Freq: Once | ORAL | Status: AC
  Administered 2015-06-13: 81 mg via ORAL
  Filled 2015-06-13: qty 1

## 2015-06-13 MED ORDER — MIDAZOLAM HCL 2 MG/2ML IJ SOLN
INTRAMUSCULAR | Status: DC | PRN
  Administered 2015-06-13 (×3): 1 mg via INTRAVENOUS

## 2015-06-13 MED ORDER — SODIUM CHLORIDE 0.9 % IV SOLN
250.0000 mL | INTRAVENOUS | Status: DC | PRN
  Administered 2015-06-17: 10 mL/h via INTRAVENOUS

## 2015-06-13 MED ORDER — FENTANYL CITRATE (PF) 100 MCG/2ML IJ SOLN
INTRAMUSCULAR | Status: AC
  Filled 2015-06-13: qty 2

## 2015-06-13 MED ORDER — SODIUM CHLORIDE 0.9% FLUSH: 3.0000 mL | INTRAVENOUS | Status: DC | PRN

## 2015-06-13 MED ORDER — SODIUM CHLORIDE 0.9% FLUSH
3.0000 mL | Freq: Two times a day (BID) | INTRAVENOUS | Status: DC
  Administered 2015-06-14 – 2015-06-16 (×4): 3 mL via INTRAVENOUS

## 2015-06-13 MED ORDER — SODIUM CHLORIDE 0.9 % IV SOLN: 250.0000 mL | INTRAVENOUS | Status: DC | PRN

## 2015-06-13 MED ORDER — NITROGLYCERIN 2 % TD OINT
0.5000 [in_us] | TOPICAL_OINTMENT | Freq: Three times a day (TID) | TRANSDERMAL | Status: DC
  Administered 2015-06-13 – 2015-06-19 (×18): 0.5 [in_us] via TOPICAL
  Filled 2015-06-13: qty 30
  Filled 2015-06-13: qty 0.5

## 2015-06-13 MED ORDER — SODIUM CHLORIDE 0.9 % WEIGHT BASED INFUSION: 3.0000 mL/kg/h | INTRAVENOUS | Status: AC

## 2015-06-13 MED ORDER — HYDROCHLOROTHIAZIDE 25 MG PO TABS
25.0000 mg | ORAL_TABLET | Freq: Every day | ORAL | Status: DC
  Administered 2015-06-14 – 2015-06-18 (×5): 25 mg via ORAL
  Filled 2015-06-13 (×5): qty 1

## 2015-06-13 MED ORDER — MIDAZOLAM HCL 2 MG/2ML IJ SOLN
INTRAMUSCULAR | Status: AC
  Filled 2015-06-13: qty 2

## 2015-06-13 MED ORDER — HEPARIN (PORCINE) IN NACL 2-0.9 UNIT/ML-% IJ SOLN
INTRAMUSCULAR | Status: AC
  Filled 2015-06-13: qty 1000

## 2015-06-13 MED ORDER — VERAPAMIL HCL 2.5 MG/ML IV SOLN
INTRAVENOUS | Status: DC | PRN
  Administered 2015-06-13 (×2): 10 mL via INTRA_ARTERIAL

## 2015-06-13 MED ORDER — IOPAMIDOL (ISOVUE-370) INJECTION 76%
INTRAVENOUS | Status: AC
  Filled 2015-06-13: qty 100

## 2015-06-13 SURGICAL SUPPLY — 13 items
CATH INFINITI 4FR 145 PIGTAIL (CATHETERS) ×1 IMPLANT
CATH INFINITI 5 FR 3DRC (CATHETERS) ×1 IMPLANT
CATH INFINITI 5 FR JL3.5 (CATHETERS) ×1 IMPLANT
CATH INFINITI 5FR AL1 (CATHETERS) ×1 IMPLANT
CATH INFINITI 5FR ANG PIGTAIL (CATHETERS) ×1 IMPLANT
CATH INFINITI JR4 5F (CATHETERS) ×1 IMPLANT
GLIDESHEATH SLEND SS 6F .021 (SHEATH) ×1 IMPLANT
KIT HEART LEFT (KITS) ×2 IMPLANT
PACK CARDIAC CATHETERIZATION (CUSTOM PROCEDURE TRAY) ×2 IMPLANT
SYR MEDRAD MARK V 150ML (SYRINGE) ×2 IMPLANT
TRANSDUCER W/STOPCOCK (MISCELLANEOUS) ×2 IMPLANT
TUBING CIL FLEX 10 FLL-RA (TUBING) ×1 IMPLANT
WIRE SAFE-T 1.5MM-J .035X260CM (WIRE) ×1 IMPLANT

## 2015-06-13 NOTE — Consult Note (Signed)
[address]       [place] [address]             [place] Record [id number] Date of Birth: 09-Nov-1960  Referring: Dr. Raniya Golembeski [name]  Primary Care: [name], [name] Dimes, [name]  Chief Complaint:    Chief Complaint  Patient presents with  . Chest Pain   patient examined, coronary angiograms personally reviewed and counseled with patient.  History of Present Illness:    Very nice 55 year old diabetic AA female smoker presented to the ED with substernal squeezing chest pain, right neck pain of increasing severity awhile walking. She presented to the ED where she had nonspecific EKG changes and positive troponin. She was admitted and prepared for cardiac catheterization. Her chest pain resolved. Cardiac catheterization demonstrated severe three-vessel coronary artery disease with tortuous coronary vessels from her history of hypertension. The patient has been on Plavix since a hypertensive stroke 3 years ago. Catheterization was via right radial artery without complication. Because of her severe three-vessel coronary disease, diabetes and unstable angina she is felt a candidate for surgical coronary revascularization.  The patient denies strong family history for CAD or CABG. Risk factors include active smoking, hypertension, dyslipidemia. Medical history is also positive for mild rheumatoid arthritis, obesity, an empty sella syndrome. Pre-CABG Dopplers and echocardiogram are pending.  Current Activity/ Functional Status: She is disabled and lives with her son and daughter-in-law. She is able to ambulate without assistance.  Zubrod Score: At the time of surgery this patient's most appropriate activity status/level should be described as: []     0    Normal activity, no symptoms []     1    Restricted in physical strenuous activity but ambulatory, able to do out light work [x]     2    Ambulatory and capable of self care,  unable to do work activities, up and about                 more than 50%  Of the time                            []     3    Only limited self care, in bed greater than 50% of waking hours []     4    Completely disabled, no self care, confined to bed or chair []     5    Moribund  Past Medical History  Diagnosis Date  . Hypertension   . Lupus ([place]) dx'd 2008    "they think I have this; have to get retested" (06/12/2015)  . 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 Empty sella ([place])   . Hidradenitis axillaris   . Obesity (BMI 30.0-34.9)   . Ovarian cyst   . Scoliosis   . Hyperlipemia   . Anxiety and depression   . Tobacco abuse   . Kidney stones   . PONV (postoperative nausea and vomiting)   . Anxiety   . Pneumonia X 2  . Type II diabetes mellitus ([place])   . Rheumatoid arthritis ([place])   . Stroke [facility]) March 2014    left MCA branches; "they say I have them all the time", denies residual on 06/12/2015    Past Surgical History  Procedure Laterality Date  . Tonsillectomy  1960s  . Breast biopsy Bilateral   . Dilation and curettage of  uterus    . Abdominal hysterectomy    . Tubal ligation      History  Smoking status  . Current Every Day Smoker -- 0.50 packs/day for 36 years  . Types: Cigarettes  Smokeless tobacco  . Never Used    History  Alcohol Use No    Social History   Social History  . Marital Status: Widowed    Spouse Name: N/A  . Number of Children: 2  . Years of Education: college   Occupational History  . Disability    Social History Main Topics  . Smoking status: Current Every Day Smoker -- 0.50 packs/day for 36 years    Types: Cigarettes  . Smokeless tobacco: Never Used  . Alcohol Use: No  . Drug Use: No  . Sexual Activity: No   Other Topics Concern  . Not on file   Social History Narrative   Patient currently lives at home with her disabled husband who was shot in 2009 and he is total care. She lives with her son and daughter and there is an 8 that comes  in. Patient currently is on disability   She occasionally drinks caffeine.     No Known Allergies  Current Facility-Administered Medications  Medication Dose Route Frequency Provider Last Rate Last Dose  . 0.9 %  sodium chloride infusion  250 mL Intravenous PRN [name] [name]      . 0.9% sodium chloride infusion  3 mL/kg/hr Intravenous Continuous [name] Portman [name] Martinique, [name] 291.9 mL/hr at 06/13/15 1635 3 mL/kg/hr at 06/13/15 1635  . acetaminophen (TYLENOL) tablet 650 mg  650 mg Oral Q4H PRN [name] [name]      . amLODipine (NORVASC) tablet 10 mg  10 mg Oral Daily [name] Quan, [name]   10 mg at 06/13/15 0951  . aspirin chewable tablet 324 mg  324 mg Oral Once [name] Morgan, [name]   324 mg at 06/12/15 1203  . canagliflozin [facility]) tablet 300 mg  300 mg Oral Daily [name] Octave Black, [name]   300 mg at 06/12/15 1700  . doxycycline (VIBRA-TABS) tablet 100 mg  100 mg Oral Q12H [name] K Rai, [name]   100 mg at 06/13/15 0950  . gi cocktail (Maalox,Lidocaine,Donnatal)  30 mL Oral QID PRN [name] [name]      . gi cocktail (Maalox,Lidocaine,Donnatal)  30 mL Oral Once [name] [name]      . Derrill Memo ON 06/14/2015] hydrochlorothiazide (HYDRODIURIL) tablet 25 mg  25 mg Oral Daily [name] K Kilroy, [name]      . insulin aspart (novoLOG) injection 0-15 Units  0-15 Units Subcutaneous TID WC [name] [name]   0 Units at 06/12/15 1700  . insulin aspart (novoLOG) injection 0-5 Units  0-5 Units Subcutaneous QHS [name] [name]   0 Units at 06/12/15 2200  . irbesartan (AVAPRO) tablet 150 mg  150 mg Oral Daily [name] Quan, [name]   150 mg at 06/13/15 0951  . mometasone-formoterol (DULERA) 200-5 MCG/ACT inhaler 2 puff  2 puff Inhalation BID [name] Poot, [name]      . morphine 2 MG/ML injection 2 mg  2 mg Intravenous Q2H PRN [name] [name]      . multivitamins with iron tablet 1 tablet  1 tablet Oral Daily [name] [name]   1 tablet at 06/13/15 [phone]  . nitroGLYCERIN (NITROGLYN) 2 % ointment 0.5 inch  0.5 inch Topical 3  times per day [name] Joiner  K Kilroy, [name]   0.5 inch at 06/13/15 0951  . nitroGLYCERIN (NITROSTAT) SL tablet 0.4 mg  0.4 mg Sublingual Q5 min PRN [name] Morgan, [name]      . ondansetron [facility]) injection 4 mg  4 mg Intravenous Q6H PRN [name] [name]      . simvastatin (ZOCOR) tablet 40 mg  40 mg Oral q1800 [name] [name]   40 mg at 06/13/15 1832  . sodium chloride flush (NS) 0.9 % injection 3 mL  3 mL Intravenous Q12H Avelina Mcclurkin [name] Martinique, [name]      . sodium chloride flush (NS) 0.9 % injection 3 mL  3 mL Intravenous PRN Ethaniel Garfield [name] Martinique, [name]        Prescriptions prior to admission  Medication Sig Dispense Refill Last Dose  . Biotin 1 MG CAPS Take 1 mg by mouth daily.   06/12/2015 at Unknown time  . Cholecalciferol (VITAMIN D3) 50000 units CAPS Take 50,000 Units by mouth once a week.   Past Week at Unknown time  . clopidogrel (PLAVIX) 75 MG tablet Take 75 mg by mouth at bedtime.   06/11/2015 at Unknown time  . INVOKANA 300 MG TABS tablet Take 300 mg by mouth daily.   06/11/2015 at Unknown time  . Iron-Vitamins (GERITOL COMPLETE PO) Take 1 tablet by mouth daily.   06/12/2015 at Unknown time  . JANUVIA 100 MG tablet Take 100 mg by mouth daily.   06/11/2015 at Unknown time  . metFORMIN (GLUCOPHAGE) 1000 MG tablet Take 1,000 mg by mouth 2 (two) times daily.   06/11/2015 at Unknown time  . Olmesartan-Amlodipine-HCTZ (TRIBENZOR) 40-10-25 MG TABS Take 1 tablet by mouth daily.   06/12/2015 at Unknown time  . simvastatin (ZOCOR) 40 MG tablet Take 40 mg by mouth daily.   06/11/2015 at Unknown time  . pravastatin (PRAVACHOL) 40 MG tablet Take 1 tablet (40 mg total) by mouth at bedtime. (Patient not taking: Reported on 06/12/2015) 30 tablet 3 Not Taking at Unknown time  . [DISCONTINUED] metFORMIN (GLUCOPHAGE) 500 MG tablet Take 1 tablet (500 mg total) by mouth 2 (two) times daily with a meal. (Patient not taking: Reported on 06/12/2015) 30 tablet 0 Not Taking at Unknown time    Family History  Problem Relation Age of Onset  .  Diabetes Mother   . Heart failure Mother   . Stroke Maternal Aunt   . Diabetes      maternal  . Breast cancer    . Alzheimer's disease      maternal     Review of Systems:       Cardiac Review of Systems: Y or N  Chest Pain [ yes   ]  Resting SOB [  no ] Exertional SOB  [ no ]  Orthopnea [ no ]   Pedal Edema [  no ]    Palpitations [no  ] Syncope  [ no ]   Presyncope [ no  ]  General Review of Systems: [Y] = yes [  ]=no Constitional: recent weight change [  ]; anorexia [  ]; fatigue [  ]; nausea [  ]; night sweats [  ]; fever [  ]; or chills [  ]  Dental: poor dentition[  ]; Last Dentist visit: One year  Eye : blurred vision [  ]; diplopia [   ]; vision changes [  ];  Amaurosis fugax[  ]; Resp: cough [  ];  wheezing[  ];  hemoptysis[  ]; shortness of breath[  ]; paroxysmal nocturnal dyspnea[  ]; dyspnea on exertion[  ]; or orthopnea[  ];  GI:  gallstones[  ], vomiting[  ];  dysphagia[  ]; melena[  ];  hematochezia [  ]; heartburn[  ];   Hx of  Colonoscopy[  ]; GU: kidney stones [place]  ]; hematuria[  ];   dysuria [  ];  nocturia[  ];  history of     obstruction [  ]; urinary frequency [  ]             Skin: rash, swelling[  ];, hair loss[  ];  peripheral edema[  ];  or itching[  ]; Musculosketetal: myalgias[  ];  joint swelling[yes rheumatoid arthritis of hips and hands ];  joint erythema[  ];  joint pain[  yes rheumatoid arthritis];  back pain[  ];  Heme/Lymph: bruising[  ];  bleeding[ no significant bleeding problem while on Plavix ];  anemia[  ];  Neuro: TIA[  ];  headaches[  ];  stroke[yes old lacunar infarct  ];  vertigo[  ];  seizures[  ];   paresthesias[  ];  difficulty walking[  ]; no numbness of her extremities  Psych:depression[  ]; anxiety[  ];  Endocrine: diabetes[yes on oral metformin ];  thyroid dysfunction[  ];  Immunizations: Flu [  ]; Pneumococcal[  ];  Other: Right-hand dominant  Physical Exam: BP 102/59  mmHg  Pulse 0  Temp(Src) 97.7 F (36.5 C) (Oral)  Resp 23  Ht 5\' 7"  (1.702 [name])  Wt 214 lb 6.4 oz (97.251 kg)  BMI 33.57 kg/m2  SpO2 98%       Physical Exam  General: Pleasant overweight AA female in no distress HEENT: Normocephalic pupils equal , dentition adequate Neck: Supple without JVD, adenopathy, or bruit Chest: Clear to auscultation, symmetrical breath sounds, no rhonchi, no tenderness             or deformity Cardiovascular: Regular rate and rhythm, no murmur, no gallop, peripheral pulses             palpable in all extremities Abdomen:  Soft, nontender, no palpable mass or organomegaly Extremities: Warm, well-perfused, no clubbing cyanosis edema or tenderness, no hematoma at right radial artery catheterization  site              no venous stasis changes of the legs Rectal/GU: Deferred Neuro: Grossly non--focal and symmetrical throughout Skin: Clean and dry without rash or ulceration   Diagnostic Studies & Laboratory data:     Recent Radiology Findings:   Dg Chest 2 View  06/12/2015  CLINICAL DATA:  Sharp mid chest pain radiating into the right arm EXAM: CHEST  2 VIEW COMPARISON:  04/11/2012 FINDINGS: There is mild right middle lobe atelectasis. There is no focal parenchymal opacity. There is no pleural effusion or pneumothorax. The heart and mediastinal contours are unremarkable. The osseous structures are unremarkable. IMPRESSION: No active cardiopulmonary disease. Electronically Signed   By: Kathreen Devoid   On: 06/12/2015 13:00   Dg Cervical Spine With Flex & Extend  06/12/2015  CLINICAL DATA:  Neck pain and bilateral hand numbness and tingling for 2 weeks with no known injury EXAM: CERVICAL SPINE COMPLETE WITH  FLEXION AND EXTENSION VIEWS COMPARISON:  None. FINDINGS: Normal alignment with no prevertebral soft tissue swelling. No fracture. There is degenerative change at the atlantodental articulation. Moderate C4-5, C5-6, and C6-7 degenerative disc disease with mild  degenerative disc disease at C7-T1. Prominent anterior osteophytes are noted at C4-5, C5-6, and C6-7. There is mild C5-6 and C6-7 facet arthropathy. There is no significant foraminal narrowing. IMPRESSION: Degenerative changes with no acute findings Electronically Signed   By: Skipper Cliche [name].D.   On: 06/12/2015 15:08     I have independently reviewed the above radiologic studies. Chest x-ray without evidence of CHF  Recent Lab Findings: Lab Results  Component Value Date   WBC 9.6 06/12/2015   HGB 16.2* 06/12/2015   HCT 48.9* 06/12/2015   PLT 320 06/12/2015   GLUCOSE 171* 06/12/2015   CHOL 308* 06/12/2015   TRIG 296* 06/12/2015   HDL 52 06/12/2015   LDLCALC 197* 06/12/2015   ALT 24 06/12/2015   AST 26 06/12/2015   NA 140 06/12/2015   K 4.0 06/12/2015   CL 103 06/12/2015   CREATININE 0.76 06/12/2015   BUN 11 06/12/2015   CO2 26 06/12/2015   TSH 0.943 06/13/2015   INR 1.14 06/13/2015   HGBA1C 8.9* 06/12/2015      Assessment / Plan:     Non-ST elevation MI with unstable angina   Severe three-vessel coronary artery disease in a diabetic pattern      Hypertension     Rheumatoid arthritis     Chronic Plavix from previous CVA   This patient would benefit from multivessel CABG for reservation of LV function and improved survival. We'll plan CABG this admission after Plavix washout. Platelet assay for P2 Y 12 activity will be checked in a.[name]. The patient understands the plan for CABG and agrees to proceed with surgery.   @ME1 @ 06/13/2015 7:55 PM

## 2015-06-13 NOTE — Care Management Obs Status (Signed)
[place] NOTIFICATION   Patient Details  Name: [name] [name] MRN: [id number] Date of Birth: Jul 29, 1960   Medicare Observation Status Notification Given:  Yes (chest pain)    Bethena Roys, RN 06/13/2015, 12:35 PM

## 2015-06-13 NOTE — Progress Notes (Signed)
Inpatient Diabetes Program Recommendations  AACE/ADA: New Consensus Statement on Inpatient Glycemic Control (2015)  Target Ranges:  Prepandial:   less than 140 mg/dL      Peak postprandial:   less than 180 mg/dL (1-2 hours)      Critically ill patients:  140 - 180 mg/dL   Spoke with patient about diabetes and home regimen for diabetes control. Patient reports that she is followed by her PCP for DM management at [facility] practice and last saw her PCP this past January. Patient reports recently having her medication adjusted. Patient also reports recently joining [employer] and starting to "focus on herself since her husbands passing." Patient has joined the silver shoes program of [employer] and has just recently found out about a food program with them with her insurance. Patient reports that she lives with her son and his family and they buy the groceries. She reports that they cannot afford to eat healthier. Inquired about prior A1C and patient reports that she does not really want to talk about her A1c she knows all about it and she has a plan to decrease her A1c. DM 2 runs in her family and she does not want to be placed on insulin.  Discussed glucose and A1C goals. Discussed importance of checking CBGs and maintaining good CBG control to prevent long-term and short-term complications. Discussed impact of nutrition, exercise, stress, sickness, and medications on diabetes control. Patient verbalized understanding of information discussed and she states that she has no further questions at this time related to diabetes.  Thanks, Tama Headings RN, MSN, [facility] Inpatient Diabetes Coordinator Team Pager [phone] (8a-5p)

## 2015-06-13 NOTE — Progress Notes (Signed)
Nutrition Education Note  RD consulted for nutrition education regarding a CHO modified and Heart Healthy diet.   Lipid Panel     Component Value Date/Time   CHOL 308* 06/12/2015 1514   TRIG 296* 06/12/2015 1514   HDL 52 06/12/2015 1514   CHOLHDL 5.9 06/12/2015 1514   VLDL 59* 06/12/2015 1514   LDLCALC 197* 06/12/2015 1514   HGB A1C MFR BLD  Date/Time Value Ref Range Status  06/12/2015 04:55 PM 8.9* 4.8 - 5.6 % Final    Comment:    (NOTE)         Pre-diabetes: 5.7 - 6.4         Diabetes: >6.4         Glycemic control for adults with diabetes: <7.0     RD provided "Heart Healthy Nutrition Therapy" and CHO counting handouts from the Academy of [employer]etics. Reviewed patient's dietary recall. Provided examples on ways to decrease sodium and fat intake in diet. Discouraged intake of processed foods and use of salt shaker. Encouraged fresh fruits and vegetables as well as whole grain sources of carbohydrates to maximize fiber intake. Teach back method used.  Patient was very talkative, wanted to tell me about her home life and the passing of her husband last June. She does not feel that her dietary needs are supported by her children, who buy the groceries and with whom she lives. Patient is interested in diet education with a dietitian after discharge. She plans to discuss this with her physician during her upcoming appointment April 11. Recommend OP diabetes diet education.   Expect fair compliance.  Body mass index is 33.57 kg/(m^2). Pt meets criteria for obesity based on current BMI.  Current diet order is NPO for a procedure (cardiac cath) today. She typically has a good appetite and eats well. No weight loss noted. Labs and medications reviewed. No further nutrition interventions warranted at this time. RD contact information provided. If additional nutrition issues arise, please re-consult RD.  Molli Barrows, RD, LDN, [place] Pager [phone] After Hours Pager  [phone]
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
## 2015-06-13 NOTE — Progress Notes (Signed)
[facility]                                                                              Patient Demographics  [name] [name], is a 55 y.o. female, DOB - [birth year], [id number]  Admit date - 06/12/2015   Admitting Physician [name] Dickens, MD  Outpatient Primary MD for the patient is [name], [name], MD  LOS -   days    Chief Complaint  Patient presents with  . Chest Pain       Brief HPI   [name] [name] is a 55 y.o. female with a past medical history that includes obesity diabetes, hypertension, previous stroke, hepatitis C, rheumatoid arthritis presented with chest pain. Patient reported chronic general pain related to rheumatoid arthritis but stated aslo intermittent "different" pain over the last 3-4 days. She reports right anterior chest pain radiating to bilateral jaw associated with a feeling of fullness in her throat worse with cough. In addition she reports bilateral hand numbness and tingling.   Assessment & Plan    Principal Problem:   NSTEMI (non-ST elevated myocardial infarction) ([place]) -  Troponins positive, d-dimer less than 0.27  -  Risk factors include obesity, diabetes, hypertension, hyperlipidemia, smoking - Cardiology consulted, recommended cardiac cath  Diabetes mellitus, uncontrolled - Hemoglobin A1c 8.9, patient absolutely does not want to go on insulin.  - place on sliding scale insulin, diabetic coordinator consult, nutrition. Patient admits that she does not follow carb modified diet as her children has been doing the grocery shopping.  Hypertension. Blood pressure controlled in the emergency department. Home medications include HCTZ, amlodipine -Continue home medications  Hyperlipidemia - Cholesterol 208, triglycerides 296, LDL 197, continue statin  History of CVA : Recent on Plavix. No deficits   Tobacco use. - Patient counseled on tobacco cessation, she declined a nicotine patch   Code Status: Full code    Family Communication: Discussed in detail with the patient, all imaging results, lab results explained to the patient   Disposition Plan: Pending cardiac cath today  Time Spent in minutes   25 minutes  Procedures    Consults   Cardiology  DVT Prophylaxis  Lovenox  Medications  Scheduled Meds: . amLODipine  10 mg Oral Daily  . aspirin  324 mg Oral Once  . canagliflozin  300 mg Oral Daily  . clopidogrel  75 mg Oral QHS  . doxycycline  100 mg Oral Q12H  . enoxaparin (LOVENOX) injection  40 mg Subcutaneous Q24H  . gi cocktail  30 mL Oral Once  . [START ON 06/14/2015] hydrochlorothiazide  25 mg Oral Daily  . insulin aspart  0-15 Units Subcutaneous TID WC  . insulin aspart  0-5 Units Subcutaneous QHS  . irbesartan  150 mg Oral Daily  . multivitamins with iron  1 tablet Oral Daily  . nitroGLYCERIN  0.5 inch Topical 3 times per day  . simvastatin  40 mg Oral q1800  . sodium chloride flush  3 mL Intravenous Q12H   Continuous Infusions: . sodium chloride 75 mL/hr at 06/13/15 0952   PRN Meds:.sodium chloride, acetaminophen, gi cocktail, morphine injection, nitroGLYCERIN, ondansetron (ZOFRAN)  IV, sodium chloride flush   Antibiotics   Anti-infectives    Start     Dose/Rate Route Frequency Ordered Stop   06/13/15 1000  doxycycline (VIBRA-TABS) tablet 100 mg     100 mg Oral Every 12 hours 06/13/15 0831          Subjective:   [name] [name] was seen and examined today.  Patient denied any chest pain.  Patient denies dizziness,  abdominal pain, N/V/D/C, new weakness, numbess, tingling. No acute events overnight.    Objective:   Filed Vitals:   06/12/15 1600 06/12/15 1618 06/12/15 2037 06/13/15 0355  BP:  133/86 132/68 101/53  Pulse:  83 77 69  Temp:  98.9 F (37.2 C) 98.4 F (36.9 C) 98.3 F (36.8 C)  TempSrc:  Oral Oral Oral  Resp:  16 18 17   Height: 5\' 7"  (1.702 m)     Weight: 97.16 kg (214 lb 3.2 oz)   97.251 kg (214 lb 6.4 oz)  SpO2:  100% 94% 100%     Intake/Output Summary (Last 24 hours) at 06/13/15 1228 Last data filed at 06/13/15 0829  Gross per 24 hour  Intake      0 ml  Output    500 ml  Net   -500 ml     Wt Readings from Last 3 Encounters:  06/13/15 97.251 kg (214 lb 6.4 oz)  11/17/12 99.474 kg (219 lb 4.8 oz)  10/05/12 100.245 kg (221 lb)     Exam  General: Alert and oriented x 3, NAD  HEENT:  PERRLA, EOMI, Anicteric Sclera, mucous membranes moist.   Neck: Supple, no JVD, no masses  CVS: S1 S2 auscultated, no rubs, murmurs or gallops. Regular rate and rhythm.  Respiratory: Clear to auscultation bilaterally, no wheezing, rales or rhonchi  Abdomen: Soft, nontender, nondistended, + bowel sounds  Ext: no cyanosis clubbing or edema  Neuro: AAOx3, Cr N's II- XII. Strength 5/5 upper and lower extremities bilaterally  Skin: No rashes  Psych: Normal affect and demeanor, alert and oriented x3    Data Reviewed:  I have personally reviewed following labs and imaging studies  Micro Results No results found for this or any previous visit (from the past 240 hour(s)).  Radiology Reports Dg Chest 2 View  06/12/2015  CLINICAL DATA:  Sharp mid chest pain radiating into the right arm EXAM: CHEST  2 VIEW COMPARISON:  04/11/2012 FINDINGS: There is mild right middle lobe atelectasis. There is no focal parenchymal opacity. There is no pleural effusion or pneumothorax. The heart and mediastinal contours are unremarkable. The osseous structures are unremarkable. IMPRESSION: No active cardiopulmonary disease. Electronically Signed   By: Kathreen Devoid   On: 06/12/2015 13:00   Dg Cervical Spine With Flex & Extend  06/12/2015  CLINICAL DATA:  Neck pain and bilateral hand numbness and tingling for 2 weeks with no known injury EXAM: CERVICAL SPINE COMPLETE WITH FLEXION AND EXTENSION VIEWS COMPARISON:  None. FINDINGS: Normal alignment with no prevertebral soft tissue swelling. No fracture. There is degenerative change at the  atlantodental articulation. Moderate C4-5, C5-6, and C6-7 degenerative disc disease with mild degenerative disc disease at C7-T1. Prominent anterior osteophytes are noted at C4-5, C5-6, and C6-7. There is mild C5-6 and C6-7 facet arthropathy. There is no significant foraminal narrowing. IMPRESSION: Degenerative changes with no acute findings Electronically Signed   By: Skipper Cliche M.D.   On: 06/12/2015 15:08    CBC  Recent Labs Lab 06/12/15 1231  WBC 9.6  HGB  16.2*  HCT 48.9*  PLT 320  MCV 89.7  MCH 29.7  MCHC 33.1  RDW 15.2    Chemistries   Recent Labs Lab 06/12/15 1231  NA 140  K 4.0  CL 103  CO2 26  GLUCOSE 171*  BUN 11  CREATININE 0.76  CALCIUM 10.1  AST 26  ALT 24  ALKPHOS 88  BILITOT 0.6   ------------------------------------------------------------------------------------------------------------------ estimated creatinine clearance is 94.1 mL/min (by C-G formula based on Cr of 0.76). ------------------------------------------------------------------------------------------------------------------  Recent Labs  06/12/15 1655  HGBA1C 8.9*   ------------------------------------------------------------------------------------------------------------------  Recent Labs  06/12/15 1514  CHOL 308*  HDL 52  LDLCALC 197*  TRIG 296*  CHOLHDL 5.9   ------------------------------------------------------------------------------------------------------------------  Recent Labs  06/13/15 0904  TSH 0.943   ------------------------------------------------------------------------------------------------------------------ No results for input(s): VITAMINB12, FOLATE, FERRITIN, TIBC, IRON, RETICCTPCT in the last 72 hours.  Coagulation profile  Recent Labs Lab 06/13/15 0904  INR 1.14     Recent Labs  06/13/15 0904  DDIMER <0.27    Cardiac Enzymes  Recent Labs Lab 06/12/15 1932 06/13/15 0400  TROPONINI 0.79* 0.75*    ------------------------------------------------------------------------------------------------------------------ Invalid input(s): POCBNP   Recent Labs  06/12/15 1623 06/12/15 2041 06/13/15 0746 06/13/15 1136  GLUCAP 167* 171* 187* 156*     Britny Riel M.D. [facility] 06/13/2015, 12:28 PM  Pager: [phone] Between 7am to 7pm - call Pager - [phone]  After 7pm go to [email or web address] - password [email or web address]  Call night coverage person covering after 7pm
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
## 2015-06-13 NOTE — Interval H&P Note (Signed)
History and Physical Interval Note:  06/13/2015 2:56 PM  [name] [name]  has presented today for surgery, with the diagnosis of cp  The various methods of treatment have been discussed with the patient and family. After consideration of risks, benefits and other options for treatment, the patient has consented to  Procedure(s): Left Heart Cath and Coronary Angiography (N/A) as a surgical intervention .  The patient's history has been reviewed, patient examined, no change in status, stable for surgery.  I have reviewed the patient's chart and labs.  Questions were answered to the patient's satisfaction.    Cath Lab Visit (complete for each Cath Lab visit)  Clinical Evaluation Leading to the Procedure:   ACS: Yes.    Non-ACS:    Anginal Classification: CCS IV  Anti-ischemic medical therapy: Minimal Therapy (1 class of medications)  Non-Invasive Test Results: No non-invasive testing performed  Prior CABG: No previous CABG       Collier [facility] 06/13/2015 2:56 PM

## 2015-06-14 ENCOUNTER — Other Ambulatory Visit: Payer: Self-pay | Admitting: *Deleted

## 2015-06-14 ENCOUNTER — Observation Stay (HOSPITAL_BASED_OUTPATIENT_CLINIC_OR_DEPARTMENT_OTHER): Payer: Commercial Managed Care - HMO

## 2015-06-14 ENCOUNTER — Observation Stay (HOSPITAL_COMMUNITY): Payer: Commercial Managed Care - HMO

## 2015-06-14 ENCOUNTER — Encounter (HOSPITAL_COMMUNITY): Payer: Self-pay | Admitting: Cardiology

## 2015-06-14 DIAGNOSIS — Z8673 Personal history of transient ischemic attack (TIA), and cerebral infarction without residual deficits: Secondary | ICD-10-CM | POA: Diagnosis not present

## 2015-06-14 DIAGNOSIS — E785 Hyperlipidemia, unspecified: Secondary | ICD-10-CM | POA: Diagnosis not present

## 2015-06-14 DIAGNOSIS — I2510 Atherosclerotic heart disease of native coronary artery without angina pectoris: Secondary | ICD-10-CM

## 2015-06-14 DIAGNOSIS — I10 Essential (primary) hypertension: Secondary | ICD-10-CM | POA: Diagnosis not present

## 2015-06-14 DIAGNOSIS — I214 Non-ST elevation (NSTEMI) myocardial infarction: Secondary | ICD-10-CM | POA: Diagnosis not present

## 2015-06-14 DIAGNOSIS — E119 Type 2 diabetes mellitus without complications: Secondary | ICD-10-CM | POA: Diagnosis not present

## 2015-06-14 LAB — HEPATIC FUNCTION PANEL
ALT: 21 U/L (ref 14–54)
AST: 23 U/L (ref 15–41)
Albumin: 3.4 g/dL — ABNORMAL LOW (ref 3.5–5.0)
Alkaline Phosphatase: 81 U/L (ref 38–126)
Bilirubin, Direct: <0.1 mg/dL — ABNORMAL LOW (ref 0.1–0.5)
Total Bilirubin: 0.5 mg/dL (ref 0.3–1.2)
Total Protein: 7.5 g/dL (ref 6.5–8.1)

## 2015-06-14 LAB — URINALYSIS, ROUTINE W REFLEX MICROSCOPIC
Bilirubin Urine: NEGATIVE
Glucose, UA: >1000 mg/dL — AB
Ketones, ur: NEGATIVE mg/dL
Nitrite: NEGATIVE
Protein, ur: NEGATIVE mg/dL
Specific Gravity, Urine: 1.037 — ABNORMAL HIGH (ref 1.005–1.030)
pH: 5 (ref 5.0–8.0)

## 2015-06-14 LAB — PROTIME-INR
INR: 1.11 (ref 0.00–1.49)
Prothrombin Time: 14.5 seconds (ref 11.6–15.2)

## 2015-06-14 LAB — GLUCOSE, CAPILLARY
Glucose-Capillary: 204 mg/dL — ABNORMAL HIGH (ref 65–99)
Glucose-Capillary: 206 mg/dL — ABNORMAL HIGH (ref 65–99)
Glucose-Capillary: 146 mg/dL — ABNORMAL HIGH (ref 65–99)

## 2015-06-14 LAB — ECHOCARDIOGRAM COMPLETE
Height: 67 in
Weight: 3449.6 oz

## 2015-06-14 LAB — BASIC METABOLIC PANEL
Anion gap: 11 (ref 5–15)
BUN: 15 mg/dL (ref 6–20)
CO2: 23 mmol/L (ref 22–32)
Calcium: 9.5 mg/dL (ref 8.9–10.3)
Chloride: 105 mmol/L (ref 101–111)
Creatinine, Ser: 0.82 mg/dL (ref 0.44–1.00)
GFR calc Af Amer: >60 mL/min (ref >60)
GFR calc non Af Amer: >60 mL/min (ref >60)
Glucose, Bld: 191 mg/dL — ABNORMAL HIGH (ref 65–99)
Potassium: 4.1 mmol/L (ref 3.5–5.1)
Sodium: 139 mmol/L (ref 135–145)

## 2015-06-14 LAB — URINE MICROSCOPIC-ADD ON: Bacteria, UA: NONE SEEN

## 2015-06-14 LAB — PLATELET INHIBITION P2Y12: Platelet Function  P2Y12: 182 [PRU] — ABNORMAL LOW (ref 194–418)

## 2015-06-14 LAB — SURGICAL PCR SCREEN
MRSA, PCR: NEGATIVE
Staphylococcus aureus: NEGATIVE

## 2015-06-14 MED ORDER — ASPIRIN 81 MG PO CHEW
81.0000 mg | CHEWABLE_TABLET | Freq: Every day | ORAL | Status: DC
  Administered 2015-06-14 – 2015-06-18 (×5): 81 mg via ORAL
  Filled 2015-06-14 (×5): qty 1

## 2015-06-14 MED ORDER — FLUCONAZOLE 150 MG PO TABS
150.0000 mg | ORAL_TABLET | Freq: Once | ORAL | Status: AC
Start: 2015-06-14 — End: 2015-06-14
  Administered 2015-06-14: 150 mg via ORAL
  Filled 2015-06-14: qty 1

## 2015-06-14 MED ORDER — IPRATROPIUM-ALBUTEROL 0.5-2.5 (3) MG/3ML IN SOLN: 3.0000 mL | Freq: Four times a day (QID) | RESPIRATORY_TRACT | Status: DC | PRN

## 2015-06-14 MED ORDER — METOPROLOL TARTRATE 25 MG PO TABS
25.0000 mg | ORAL_TABLET | Freq: Two times a day (BID) | ORAL | Status: DC
  Administered 2015-06-14 – 2015-06-18 (×10): 25 mg via ORAL
  Filled 2015-06-14 (×10): qty 1

## 2015-06-14 MED ORDER — ATORVASTATIN CALCIUM 40 MG PO TABS
40.0000 mg | ORAL_TABLET | Freq: Every day | ORAL | Status: DC
  Administered 2015-06-14 – 2015-06-24 (×11): 40 mg via ORAL
  Filled 2015-06-14 (×12): qty 1

## 2015-06-14 MED ORDER — IPRATROPIUM-ALBUTEROL 0.5-2.5 (3) MG/3ML IN SOLN
3.0000 mL | Freq: Three times a day (TID) | RESPIRATORY_TRACT | Status: DC
  Administered 2015-06-14: 3 mL via RESPIRATORY_TRACT
  Filled 2015-06-14 (×2): qty 3

## 2015-06-14 MED ORDER — INSULIN GLARGINE 100 UNIT/ML ~~LOC~~ SOLN
5.0000 [IU] | Freq: Every day | SUBCUTANEOUS | Status: DC
  Administered 2015-06-15 – 2015-06-16 (×2): 5 [IU] via SUBCUTANEOUS
  Filled 2015-06-14 (×4): qty 0.05

## 2015-06-14 MED ORDER — INSULIN ASPART 100 UNIT/ML ~~LOC~~ SOLN: 4.0000 [IU] | Freq: Three times a day (TID) | SUBCUTANEOUS | Status: DC

## 2015-06-14 NOTE — Progress Notes (Signed)
1 Day Post-Op Procedure(s) (LRB): Left Heart Cath and Coronary Angiography (N/A) Subjective: Stable on iv heparin plavix washout in progress for later CABG  Objective: Vital signs in last 24 hours: Temp:  [98.2 F (36.8 C)-98.6 F (37 C)] 98.2 F (36.8 C) (03/30 1425) Pulse Rate:  [77-78] 77 (03/30 1425) Cardiac Rhythm:  [-] Normal sinus rhythm (03/30 0746) Resp:  [18] 18 (03/30 1425) BP: (102-128)/(53-91) 124/70 mmHg (03/30 1425) SpO2:  [95 %-98 %] 95 % (03/30 1425) Weight:  [215 lb 9.6 oz (97.796 kg)] 215 lb 9.6 oz (97.796 kg) (03/30 0540)  Hemodynamic parameters for last 24 hours:    Intake/Output from previous day: 03/29 0701 - 03/30 0700 In: 480 [P.O.:480] Out: 800 [Urine:800] Intake/Output this shift: Total I/O In: 480 [P.O.:480] Out: -     Lab Results:  Recent Labs  06/12/15 1231  WBC 9.6  HGB 16.2*  HCT 48.9*  PLT 320   BMET:  Recent Labs  06/12/15 1231 06/14/15 0740  NA 140 139  K 4.0 4.1  CL 103 105  CO2 26 23  GLUCOSE 171* 191*  BUN 11 15  CREATININE 0.76 0.82  CALCIUM 10.1 9.5    PT/INR:  Recent Labs  06/14/15 0325  LABPROT 14.5  INR 1.11   ABG    Component Value Date/Time   TCO2 26 09/24/2012 0058   CBG (last 3)   Recent Labs  06/13/15 2123 06/14/15 0741 06/14/15 1626  GLUCAP 191* 204* 206*    Assessment/Plan: S/P Procedure(s) (LRB): Left Heart Cath and Coronary Angiography (N/A) Her P2Y12 is low- CABG scheduled for tues am April 4      [name] III 06/14/2015

## 2015-06-14 NOTE — Progress Notes (Signed)
Patient Name: MANASVI BERNDT Date of Encounter: 06/14/2015   SUBJECTIVE  Complain of diet. No chest pain and SOB.   CURRENT MEDS . amLODipine  10 mg Oral Daily  . aspirin  324 mg Oral Once  . canagliflozin  300 mg Oral Daily  . doxycycline  100 mg Oral Q12H  . gi cocktail  30 mL Oral Once  . hydrochlorothiazide  25 mg Oral Daily  . insulin aspart  0-15 Units Subcutaneous TID WC  . insulin aspart  0-5 Units Subcutaneous QHS  . ipratropium-albuterol  3 mL Nebulization TID  . irbesartan  150 mg Oral Daily  . mometasone-formoterol  2 puff Inhalation BID  . multivitamins with iron  1 tablet Oral Daily  . nitroGLYCERIN  0.5 inch Topical 3 times per day  . simvastatin  40 mg Oral q1800  . sodium chloride flush  3 mL Intravenous Q12H    OBJECTIVE  Filed Vitals:   06/13/15 1845 06/13/15 2108 06/14/15 0540 06/14/15 0925  BP: 102/59 128/77 105/53 126/69  Pulse:  77 78   Temp:  98.6 F (37 C) 98.3 F (36.8 C)   TempSrc:  Oral Oral   Resp:  18 18   Height:      Weight:   215 lb 9.6 oz (97.796 kg)   SpO2:  98% 95%     Intake/Output Summary (Last 24 hours) at 06/14/15 0952 Last data filed at 06/14/15 0541  Gross per 24 hour  Intake    480 ml  Output    800 ml  Net   -320 ml   Filed Weights   06/12/15 1600 06/13/15 0355 06/14/15 0540  Weight: 214 lb 3.2 oz (97.16 kg) 214 lb 6.4 oz (97.251 kg) 215 lb 9.6 oz (97.796 kg)    PHYSICAL EXAM  General: Pleasant, NAD. Neuro: Alert and oriented X 3. Moves all extremities spontaneously. Psych: Normal affect. HEENT:  Normal  Neck: Supple without bruits or JVD. Lungs:  Resp regular and unlabored, CTA with course breath sound. Just had nebulizer treatment.  Heart: RRR no s3, s4, or murmurs. Abdomen: Soft, non-tender, non-distended, BS + x 4.  Extremities: No clubbing, cyanosis or edema. DP/PT/Radials 2+ and equal bilaterally. R radial cath site without hematoma.   Accessory Clinical Findings  CBC  Recent Labs   06/12/15 1231  WBC 9.6  HGB 16.2*  HCT 48.9*  MCV 89.7  PLT 99991111   Basic Metabolic Panel  Recent Labs  06/12/15 1231 06/14/15 0740  NA 140 139  K 4.0 4.1  CL 103 105  CO2 26 23  GLUCOSE 171* 191*  BUN 11 15  CREATININE 0.76 0.82  CALCIUM 10.1 9.5   Liver Function Tests  Recent Labs  06/12/15 1231 06/14/15 0325  AST 26 23  ALT 24 21  ALKPHOS 88 81  BILITOT 0.6 0.5  PROT 8.1 7.5  ALBUMIN 3.8 3.4*   No results for input(s): LIPASE, AMYLASE in the last 72 hours. Cardiac Enzymes  Recent Labs  06/12/15 1932 06/13/15 0400  TROPONINI 0.79* 0.75*   BNP Invalid input(s): POCBNP D-Dimer  Recent Labs  06/13/15 0904  DDIMER <0.27   Hemoglobin A1C  Recent Labs  06/12/15 1655  HGBA1C 8.9*   Fasting Lipid Panel  Recent Labs  06/12/15 1514  CHOL 308*  HDL 52  LDLCALC 197*  TRIG 296*  CHOLHDL 5.9   Thyroid Function Tests  Recent Labs  06/13/15 0904  TSH 0.943    TELE  Sinus  rhythm  Radiology/Studies  Dg Chest 2 View  06/12/2015  CLINICAL DATA:  Sharp mid chest pain radiating into the right arm EXAM: CHEST  2 VIEW COMPARISON:  04/11/2012 FINDINGS: There is mild right middle lobe atelectasis. There is no focal parenchymal opacity. There is no pleural effusion or pneumothorax. The heart and mediastinal contours are unremarkable. The osseous structures are unremarkable. IMPRESSION: No active cardiopulmonary disease. Electronically Signed   By: Kathreen Devoid   On: 06/12/2015 13:00   Dg Cervical Spine With Flex & Extend  06/12/2015  CLINICAL DATA:  Neck pain and bilateral hand numbness and tingling for 2 weeks with no known injury EXAM: CERVICAL SPINE COMPLETE WITH FLEXION AND EXTENSION VIEWS COMPARISON:  None. FINDINGS: Normal alignment with no prevertebral soft tissue swelling. No fracture. There is degenerative change at the atlantodental articulation. Moderate C4-5, C5-6, and C6-7 degenerative disc disease with mild degenerative disc disease at  C7-T1. Prominent anterior osteophytes are noted at C4-5, C5-6, and C6-7. There is mild C5-6 and C6-7 facet arthropathy. There is no significant foraminal narrowing. IMPRESSION: Degenerative changes with no acute findings Electronically Signed   By: Skipper Cliche M.D.   On: 06/12/2015 15:08   Cath 06/13/15 Left Heart Cath and Coronary Angiography    Conclusion     Ost RCA to Mid RCA lesion, 80% stenosed.  Prox LAD lesion, 85% stenosed.  Lat 1st Mrg-1 lesion, 90% stenosed.  Lat 1st Mrg-2 lesion, 85% stenosed.  The left ventricular systolic function is normal.  1. Severe 3 vessel obstructive CAD 2. Normal LV function  Plan: I reviewed study with Dr. Burt Knack. She has complex 3 vessel disease. Percutaneous intervention would be difficult given marked tortuosity of the LCx/OM and diffuse disease in the RCA. With history of DM she may be more appropriately treated with CABG. Will consult surgery. If she is felt not to be a surgical candidate then complex PCI would be an option. For future cath procedures I would recommend a femoral approach.    ASSESSMENT AND PLAN  1. NSTEMI (non-ST elevated myocardial infarction) (HCC) - Troponin of 0.79-->0.75. Cath showed evere three-vessel coronary artery disease with tortuous coronary vessels from her history of hypertension. Seen by surgery plan for CABG this admission once washed out plavix. P2Y12 of 182. LDL 197. A1c of 8.9. Needs better control of DM and cholesterol.  - On Zocor to 40mg --> consider change to lipitor 80mg  and adding BB.    Otherwise per primary   Lupus ([place])   History of stroke   HTN (hypertension)   Rheumatoid arthritis ([place])   Obesity (BMI 30.0-34.9)   Type 2 diabetes mellitus, uncontrolled (HCC)   Tobacco abuse   Dyslipidemia-LDL 197   Pain in the chest   Diabetes mellitus without complication ([place])     Signed, Bhagat,Bhavinkumar [name] Pager [phone]  Personally seen and examined. Agree with  above.  -CABG -P2Y12 182, mildly inhibited. Off Plavix.  -Will change zocor to atorva 40mg .  -Will stop amlodipine and start metoprolol 25mg  BID  Candee Furbish, MD

## 2015-06-14 NOTE — Progress Notes (Addendum)
Pre-op Cardiac Surgery  Carotid Findings:  There is no obvious evidence of hemodynamically significant right internal carotid artery stenosis. The left internal carotid artery exhibits 1-39% stenosis. Vertebral arteries are patent with antegrade flow.  Upper Extremity Right Left  Brachial Pressures 139-Triphasic 136-Triphasic  Radial Waveforms Triphasic Triphasic  Ulnar Waveforms Triphasic Triphasic  Palmar Arch (Allen's Test) Signal is unaffected with radial compression, signal obliterates with ulnar compression. Signal is unaffected with radial compression, decreases >50% with ulnar compression.    Lower  Extremity Right Left  Dorsalis Pedis 156-Monophasic 135-Biphasic  Anterior Tibial    Posterior Tibial 137-Biphasic 115-Biphasic  Ankle/Brachial Indices 1.12 0.97   Bilateral ABIs are within normal limits.  06/14/2015 1:01 PM Maudry Mayhew, RVT, RDCS, [place], [place]

## 2015-06-14 NOTE — Progress Notes (Signed)
  Echocardiogram 2D Echocardiogram has been performed.  Johny Chess 06/14/2015, 12:18 PM

## 2015-06-14 NOTE — Progress Notes (Addendum)
[facility]                                                                              Patient Demographics  [name] [name], is a 55 y.o. female, DOB - [birth year], LE:[id number]  Admit date - 06/12/2015   Admitting Physician Waldemar Dickens, MD  Outpatient Primary MD for the patient is [name], [name], MD  LOS -   days    Chief Complaint  Patient presents with  . Chest Pain       Brief HPI   [name] [name] is a 55 y.o. female with a past medical history that includes obesity diabetes, hypertension, previous stroke, hepatitis C, rheumatoid arthritis presented with chest pain. Patient reported chronic general pain related to rheumatoid arthritis but stated aslo intermittent "different" pain over the last 3-4 days. She reports right anterior chest pain radiating to bilateral jaw associated with a feeling of fullness in her throat worse with cough. In addition she reports bilateral hand numbness and tingling.   Assessment & Plan    Principal Problem:   NSTEMI (non-ST elevated myocardial infarction) ([place])-  Risk factors include obesity, diabetes, hypertension, hyperlipidemia, smoking -  Troponins positive, d-dimer less than 0.27  - [facility] cath 3/29 showed severe three-vessel obstructive CAD with normal LV function. Patient was recommended CABG. Patient has been seen by cardiothoracic surgery, defer timing of CABG to [facility]  Diabetes mellitus, uncontrolled - Hemoglobin A1c 8.9, patient absolutely does not want to go on insulin.  - place on sliding scale insulin, diabetic coordinator consult, nutrition. Patient admits that she does not follow carb modified diet as her children has been doing the grocery shopping. - In the setting of pending surgery, will control her blood sugars more aggressively, placed on meal coverage, Lantus, continue sliding scale insulin  Hypertension. Blood pressure controlled in the emergency department. Home medications include  HCTZ, amlodipine -Per cardiology, stop amlodipine and patient placed on beta blocker  Hyperlipidemia - Cholesterol 208, triglycerides 296, LDL 197, continue statin - Zocor changed to atorvastatin 40 mg daily  History of CVA : Recent on Plavix. No deficits   Tobacco use. - Patient counseled on tobacco cessation, she declined a nicotine patch   Depression - Patient's husband had passed last year, his birthday was in April, hence patient is feeling anxious. She however declines to be on any antianxiety or antidepressants.  Boils in the axilla likely due to staph infection, patient reports recurrent boils - Placed on doxycycline on 3/29  Code Status: Full code   Family Communication: Discussed in detail with the patient, all imaging results, lab results explained to the patient   Disposition Plan: Pending cardiac cath today  Time Spent in minutes   25 minutes  Procedures    Consults   Cardiology  DVT Prophylaxis  Lovenox  Medications  Scheduled Meds: . aspirin  324 mg Oral Once  . aspirin  81 mg Oral Daily  . atorvastatin  40 mg Oral q1800  . canagliflozin  300 mg Oral Daily  . doxycycline  100 mg Oral Q12H  . gi cocktail  30 mL Oral Once  .  hydrochlorothiazide  25 mg Oral Daily  . insulin aspart  0-15 Units Subcutaneous TID WC  . insulin aspart  0-5 Units Subcutaneous QHS  . ipratropium-albuterol  3 mL Nebulization TID  . irbesartan  150 mg Oral Daily  . metoprolol tartrate  25 mg Oral BID  . mometasone-formoterol  2 puff Inhalation BID  . multivitamins with iron  1 tablet Oral Daily  . nitroGLYCERIN  0.5 inch Topical 3 times per day  . sodium chloride flush  3 mL Intravenous Q12H   Continuous Infusions:   PRN Meds:.sodium chloride, acetaminophen, gi cocktail, morphine injection, nitroGLYCERIN, ondansetron (ZOFRAN) IV, sodium chloride flush   Antibiotics   Anti-infectives    Start     Dose/Rate Route Frequency Ordered Stop   06/14/15 0930  fluconazole  (DIFLUCAN) tablet 150 mg     150 mg Oral  Once 06/14/15 0855 06/14/15 0950   06/13/15 1000  doxycycline (VIBRA-TABS) tablet 100 mg     100 mg Oral Every 12 hours 06/13/15 0831          Subjective:   [name] [name] was seen and examined today.  Patient denied any chest pain. Patient denies dizziness,  abdominal pain, N/V/D/C, new weakness, numbess, tingling. No acute events overnight.    Objective:   Filed Vitals:   06/13/15 1845 06/13/15 2108 06/14/15 0540 06/14/15 0925  BP: 102/59 128/77 105/53 126/69  Pulse:  77 78   Temp:  98.6 F (37 C) 98.3 F (36.8 C)   TempSrc:  Oral Oral   Resp:  18 18   Height:      Weight:   97.796 kg (215 lb 9.6 oz)   SpO2:  98% 95%     Intake/Output Summary (Last 24 hours) at 06/14/15 1200 Last data filed at 06/14/15 1031  Gross per 24 hour  Intake    720 ml  Output    800 ml  Net    -80 ml     Wt Readings from Last 3 Encounters:  06/14/15 97.796 kg (215 lb 9.6 oz)  11/17/12 99.474 kg (219 lb 4.8 oz)  10/05/12 100.245 kg (221 lb)     Exam  General: Alert and oriented x 3, NAD  HEENT:    Neck:   CVS: S1 S2 clear, RRR   Respiratory: Clear to auscultation bilaterally, no wheezing, rales or rhonchi  Abdomen: Soft, nontender, nondistended, + bowel sounds  Ext: no cyanosis clubbing or edema  Neuro: no new deficits  Skin: No rashes  Psych: Normal affect and demeanor, alert and oriented x3    Data Reviewed:  I have personally reviewed following labs and imaging studies  Micro Results Recent Results (from the past 240 hour(s))  Surgical pcr screen     Status: None   Collection Time: 06/13/15 10:37 PM  Result Value Ref Range Status   MRSA, PCR NEGATIVE NEGATIVE Final   Staphylococcus aureus NEGATIVE NEGATIVE Final    Comment:        The Xpert SA Assay (FDA approved for NASAL specimens in patients over 30 years of age), is one component of a comprehensive surveillance program.  Test performance has been validated by  [facility] for patients greater than or equal to 75 year old. It is not intended to diagnose infection nor to guide or monitor treatment.     Radiology Reports Dg Chest 2 View  06/12/2015  CLINICAL DATA:  Sharp mid chest pain radiating into the right arm EXAM: CHEST  2 VIEW COMPARISON:  04/11/2012 FINDINGS: There is mild right middle lobe atelectasis. There is no focal parenchymal opacity. There is no pleural effusion or pneumothorax. The heart and mediastinal contours are unremarkable. The osseous structures are unremarkable. IMPRESSION: No active cardiopulmonary disease. Electronically Signed   By: Kathreen Devoid   On: 06/12/2015 13:00   Dg Cervical Spine With Flex & Extend  06/12/2015  CLINICAL DATA:  Neck pain and bilateral hand numbness and tingling for 2 weeks with no known injury EXAM: CERVICAL SPINE COMPLETE WITH FLEXION AND EXTENSION VIEWS COMPARISON:  None. FINDINGS: Normal alignment with no prevertebral soft tissue swelling. No fracture. There is degenerative change at the atlantodental articulation. Moderate C4-5, C5-6, and C6-7 degenerative disc disease with mild degenerative disc disease at C7-T1. Prominent anterior osteophytes are noted at C4-5, C5-6, and C6-7. There is mild C5-6 and C6-7 facet arthropathy. There is no significant foraminal narrowing. IMPRESSION: Degenerative changes with no acute findings Electronically Signed   By: Skipper Cliche M.D.   On: 06/12/2015 15:08    CBC  Recent Labs Lab 06/12/15 1231  WBC 9.6  HGB 16.2*  HCT 48.9*  PLT 320  MCV 89.7  MCH 29.7  MCHC 33.1  RDW 15.2    Chemistries   Recent Labs Lab 06/12/15 1231 06/14/15 0325 06/14/15 0740  NA 140  --  139  K 4.0  --  4.1  CL 103  --  105  CO2 26  --  23  GLUCOSE 171*  --  191*  BUN 11  --  15  CREATININE 0.76  --  0.82  CALCIUM 10.1  --  9.5  AST 26 23  --   ALT 24 21  --   ALKPHOS 88 81  --   BILITOT 0.6 0.5  --     ------------------------------------------------------------------------------------------------------------------ estimated creatinine clearance is 92 mL/min (by C-G formula based on Cr of 0.82). ------------------------------------------------------------------------------------------------------------------  Recent Labs  06/12/15 1655  HGBA1C 8.9*   ------------------------------------------------------------------------------------------------------------------  Recent Labs  06/12/15 1514  CHOL 308*  HDL 52  LDLCALC 197*  TRIG 296*  CHOLHDL 5.9   ------------------------------------------------------------------------------------------------------------------  Recent Labs  06/13/15 0904  TSH 0.943   ------------------------------------------------------------------------------------------------------------------ No results for input(s): VITAMINB12, FOLATE, FERRITIN, TIBC, IRON, RETICCTPCT in the last 72 hours.  Coagulation profile  Recent Labs Lab 06/13/15 0904 06/14/15 0325  INR 1.14 1.11     Recent Labs  06/13/15 0904  DDIMER <0.27    Cardiac Enzymes  Recent Labs Lab 06/12/15 1932 06/13/15 0400  TROPONINI 0.79* 0.75*   ------------------------------------------------------------------------------------------------------------------ Invalid input(s): POCBNP   Recent Labs  06/12/15 2041 06/13/15 0746 06/13/15 1136 06/13/15 1624 06/13/15 2123 06/14/15 0741  GLUCAP 171* 187* 156* 126* 191* 204*     Yusra Ravert M.D. [facility] 06/14/2015, 12:00 PM  Pager: [phone] Between 7am to 7pm - call Pager - [phone]  After 7pm go to [email or web address] - password [email or web address]  Call night coverage person covering after 7pm

## 2015-06-14 NOTE — Progress Notes (Signed)
[id number] Pt is very upset and wants to go home until surgery date. Listened to pt and she told me how she is missing her husband who died in 08-24-2022 and feels depressed. Offered to get chaplain to see pt but she declined. Pt did not want to walk at this time. 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 ECHO came to get pt so I left [facility] booklet, care guide and wrote down how to view pre op video if she wants to watch.  Will follow up at a later date. Graylon Good RN BSN 06/14/2015 11:34 AM

## 2015-06-15 ENCOUNTER — Observation Stay (HOSPITAL_COMMUNITY): Payer: Commercial Managed Care - HMO

## 2015-06-15 DIAGNOSIS — E119 Type 2 diabetes mellitus without complications: Secondary | ICD-10-CM | POA: Diagnosis present

## 2015-06-15 DIAGNOSIS — Z8673 Personal history of transient ischemic attack (TIA), and cerebral infarction without residual deficits: Secondary | ICD-10-CM | POA: Diagnosis not present

## 2015-06-15 DIAGNOSIS — I2510 Atherosclerotic heart disease of native coronary artery without angina pectoris: Secondary | ICD-10-CM | POA: Diagnosis not present

## 2015-06-15 DIAGNOSIS — R0789 Other chest pain: Secondary | ICD-10-CM

## 2015-06-15 DIAGNOSIS — E1165 Type 2 diabetes mellitus with hyperglycemia: Secondary | ICD-10-CM | POA: Diagnosis present

## 2015-06-15 DIAGNOSIS — I10 Essential (primary) hypertension: Secondary | ICD-10-CM | POA: Diagnosis present

## 2015-06-15 DIAGNOSIS — Z7984 Long term (current) use of oral hypoglycemic drugs: Secondary | ICD-10-CM | POA: Diagnosis not present

## 2015-06-15 DIAGNOSIS — R079 Chest pain, unspecified: Secondary | ICD-10-CM | POA: Diagnosis present

## 2015-06-15 DIAGNOSIS — E785 Hyperlipidemia, unspecified: Secondary | ICD-10-CM | POA: Diagnosis present

## 2015-06-15 DIAGNOSIS — R9439 Abnormal result of other cardiovascular function study: Secondary | ICD-10-CM | POA: Diagnosis not present

## 2015-06-15 DIAGNOSIS — I25110 Atherosclerotic heart disease of native coronary artery with unstable angina pectoris: Secondary | ICD-10-CM | POA: Diagnosis present

## 2015-06-15 DIAGNOSIS — Z833 Family history of diabetes mellitus: Secondary | ICD-10-CM | POA: Diagnosis not present

## 2015-06-15 DIAGNOSIS — E669 Obesity, unspecified: Secondary | ICD-10-CM

## 2015-06-15 DIAGNOSIS — F17210 Nicotine dependence, cigarettes, uncomplicated: Secondary | ICD-10-CM | POA: Diagnosis present

## 2015-06-15 DIAGNOSIS — R52 Pain, unspecified: Secondary | ICD-10-CM

## 2015-06-15 DIAGNOSIS — K5900 Constipation, unspecified: Secondary | ICD-10-CM | POA: Diagnosis not present

## 2015-06-15 DIAGNOSIS — Z8619 Personal history of other infectious and parasitic diseases: Secondary | ICD-10-CM | POA: Diagnosis not present

## 2015-06-15 DIAGNOSIS — I214 Non-ST elevation (NSTEMI) myocardial infarction: Secondary | ICD-10-CM | POA: Diagnosis present

## 2015-06-15 DIAGNOSIS — M069 Rheumatoid arthritis, unspecified: Secondary | ICD-10-CM | POA: Diagnosis present

## 2015-06-15 DIAGNOSIS — E8770 Fluid overload, unspecified: Secondary | ICD-10-CM | POA: Diagnosis not present

## 2015-06-15 DIAGNOSIS — Z6833 Body mass index (BMI) 33.0-33.9, adult: Secondary | ICD-10-CM | POA: Diagnosis not present

## 2015-06-15 DIAGNOSIS — Z7902 Long term (current) use of antithrombotics/antiplatelets: Secondary | ICD-10-CM | POA: Diagnosis not present

## 2015-06-15 DIAGNOSIS — D62 Acute posthemorrhagic anemia: Secondary | ICD-10-CM | POA: Diagnosis not present

## 2015-06-15 DIAGNOSIS — M329 Systemic lupus erythematosus, unspecified: Secondary | ICD-10-CM | POA: Diagnosis present

## 2015-06-15 LAB — BASIC METABOLIC PANEL
Anion gap: 14 (ref 5–15)
BUN: 16 mg/dL (ref 6–20)
CO2: 21 mmol/L — ABNORMAL LOW (ref 22–32)
Calcium: 9.9 mg/dL (ref 8.9–10.3)
Chloride: 102 mmol/L (ref 101–111)
Creatinine, Ser: 0.87 mg/dL (ref 0.44–1.00)
GFR calc Af Amer: >60 mL/min (ref >60)
GFR calc non Af Amer: >60 mL/min (ref >60)
Glucose, Bld: 223 mg/dL — ABNORMAL HIGH (ref 65–99)
Potassium: 3.8 mmol/L (ref 3.5–5.1)
Sodium: 137 mmol/L (ref 135–145)

## 2015-06-15 LAB — PULMONARY FUNCTION TEST
DL/VA % pred: 93 %
DL/VA: 4.7 ml/min/mmHg/L
DLCO cor % pred: 62 %
DLCO cor: 16.75 ml/min/mmHg
DLCO unc % pred: 64 %
DLCO unc: 17.48 ml/min/mmHg
FEF 25-75 Post: 1.33 L/sec
FEF 25-75 Pre: 1.73 L/sec
FEF2575-%Change-Post: -23 %
FEF2575-%Pred-Post: 56 %
FEF2575-%Pred-Pre: 73 %
FEV1-%Change-Post: -4 %
FEV1-%Pred-Post: 73 %
FEV1-%Pred-Pre: 77 %
FEV1-Post: 1.73 L
FEV1-Pre: 1.82 L
FEV1FVC-%Change-Post: 4 %
FEV1FVC-%Pred-Pre: 99 %
FEV6-%Change-Post: -8 %
FEV6-%Pred-Post: 72 %
FEV6-%Pred-Pre: 79 %
FEV6-Post: 2.09 L
FEV6-Pre: 2.29 L
FEV6FVC-%Change-Post: 0 %
FEV6FVC-%Pred-Post: 103 %
FEV6FVC-%Pred-Pre: 103 %
FVC-%Change-Post: -8 %
FVC-%Pred-Post: 70 %
FVC-%Pred-Pre: 77 %
FVC-Post: 2.09 L
FVC-Pre: 2.29 L
Post FEV1/FVC ratio: 83 %
Post FEV6/FVC ratio: 100 %
Pre FEV1/FVC ratio: 79 %
Pre FEV6/FVC Ratio: 100 %
RV % pred: 96 %
RV: 1.94 L
TLC % pred: 80 %
TLC: 4.33 L

## 2015-06-15 LAB — GLUCOSE, CAPILLARY
Glucose-Capillary: 177 mg/dL — ABNORMAL HIGH (ref 65–99)
Glucose-Capillary: 162 mg/dL — ABNORMAL HIGH (ref 65–99)
Glucose-Capillary: 169 mg/dL — ABNORMAL HIGH (ref 65–99)
Glucose-Capillary: 180 mg/dL — ABNORMAL HIGH (ref 65–99)

## 2015-06-15 LAB — HEPARIN LEVEL (UNFRACTIONATED): Heparin Unfractionated: 0.22 IU/mL — ABNORMAL LOW (ref 0.30–0.70)

## 2015-06-15 LAB — CBC
HCT: 45.6 % (ref 36.0–46.0)
Hemoglobin: 14.9 g/dL (ref 12.0–15.0)
MCH: 29.2 pg (ref 26.0–34.0)
MCHC: 32.7 g/dL (ref 30.0–36.0)
MCV: 89.2 fL (ref 78.0–100.0)
Platelets: 316 10*3/uL (ref 150–400)
RBC: 5.11 MIL/uL (ref 3.87–5.11)
RDW: 15.4 % (ref 11.5–15.5)
WBC: 10.4 10*3/uL (ref 4.0–10.5)

## 2015-06-15 LAB — PLATELET INHIBITION P2Y12: Platelet Function  P2Y12: 150 [PRU] — ABNORMAL LOW (ref 194–418)

## 2015-06-15 MED ORDER — ALBUTEROL SULFATE (2.5 MG/3ML) 0.083% IN NEBU: 2.5000 mg | INHALATION_SOLUTION | Freq: Once | RESPIRATORY_TRACT | Status: DC

## 2015-06-15 MED ORDER — HEPARIN BOLUS VIA INFUSION
4000.0000 [IU] | Freq: Once | INTRAVENOUS | Status: AC
  Administered 2015-06-15: 4000 [IU] via INTRAVENOUS
  Filled 2015-06-15: qty 4000

## 2015-06-15 MED ORDER — HEPARIN (PORCINE) IN NACL 100-0.45 UNIT/ML-% IJ SOLN
1400.0000 [IU]/h | INTRAMUSCULAR | Status: DC
  Administered 2015-06-15: 1250 [IU]/h via INTRAVENOUS
  Administered 2015-06-15 – 2015-06-18 (×5): 1400 [IU]/h via INTRAVENOUS
  Filled 2015-06-15 (×7): qty 250

## 2015-06-15 NOTE — Progress Notes (Signed)
ANTICOAGULATION CONSULT NOTE - Initial Consult  Pharmacy Consult for heparin Indication: CAD awaiting Plavix washout prior to OR  No Known Allergies  Patient Measurements: Height: 5\' 7"  (170.2 cm) Weight: 213 lb 3.2 oz (96.707 kg) IBW/kg (Calculated) : 61.6 Heparin Dosing Weight: 83  Vital Signs: Temp: 98.1 F (36.7 C) (03/31 0505) Temp Source: Oral (03/31 0505) BP: 116/72 mmHg (03/31 0505) Pulse Rate: 79 (03/31 0505)  Labs:  Recent Labs  06/12/15 1231 06/12/15 1932 06/13/15 0400 06/13/15 0904 06/14/15 0325 06/14/15 0740 06/15/15 0453  HGB 16.2*  --   --   --   --   --  14.9  HCT 48.9*  --   --   --   --   --  45.6  PLT 320  --   --   --   --   --  316  LABPROT  --   --   --  14.8 14.5  --   --   INR  --   --   --  1.14 1.11  --   --   CREATININE 0.76  --   --   --   --  0.82 0.87  TROPONINI  --  0.79* 0.75*  --   --   --   --     Estimated Creatinine Clearance: 86.2 mL/min (by C-G formula based on Cr of 0.87).   Medical History: Past Medical History  Diagnosis Date  . Hypertension   . Lupus ([place]) dx'd 2008    "they think I have this; have to get retested" (06/12/2015)  . 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 Empty sella ([place])   . Hidradenitis axillaris   . Obesity (BMI 30.0-34.9)   . Ovarian cyst   . Scoliosis   . Hyperlipemia   . Anxiety and depression   . Tobacco abuse   . Kidney stones   . PONV (postoperative nausea and vomiting)   . Anxiety   . Pneumonia X 2  . Type II diabetes mellitus ([place])   . Rheumatoid arthritis ([place])   . Stroke [facility]) March 2014    left MCA branches; "they say I have them all the time", denies residual on 06/12/2015    Medications:  Scheduled:  . albuterol  2.5 mg Nebulization Once  . aspirin  324 mg Oral Once  . aspirin  81 mg Oral Daily  . atorvastatin  40 mg Oral q1800  . canagliflozin  300 mg Oral Daily  . doxycycline  100 mg Oral Q12H  . gi cocktail  30 mL Oral Once  . hydrochlorothiazide  25 mg Oral Daily  . insulin  aspart  0-15 Units Subcutaneous TID WC  . insulin aspart  0-5 Units Subcutaneous QHS  . insulin aspart  4 Units Subcutaneous TID WC  . insulin glargine  5 Units Subcutaneous QHS  . irbesartan  150 mg Oral Daily  . metoprolol tartrate  25 mg Oral BID  . mometasone-formoterol  2 puff Inhalation BID  . multivitamins with iron  1 tablet Oral Daily  . nitroGLYCERIN  0.5 inch Topical 3 times per day  . sodium chloride flush  3 mL Intravenous Q12H    Assessment: 55 yo female admitted with ACS, cath showed severe 3V CAD, planning for CABG on Tuesday after Plavix washout.  Pharmacy asked by Dr. Prescott Gum to start IV heparin until procedure.  Scr 0.87, CrCl ~ 86 ml/min.  Spoke to patient, no known hx bleeding, no anticoag PTA.  Goal of Therapy:  Heparin level 0.3-0.7 units/ml Monitor platelets by anticoagulation protocol: Yes   Plan:  1. Start IV heparin with bolus of 4000 units x 1. 2. Then start heparin gtt at 1250 units/hr. 3. Check heparin level and CBC in 6 hrs. 4. Daily heparin level and CBC.  Uvaldo Rising, BCPS  Clinical Pharmacist Pager [phone]  06/15/2015 8:48 AM

## 2015-06-15 NOTE — Progress Notes (Signed)
ANTICOAGULATION CONSULT NOTE - Initial Consult  Pharmacy Consult for heparin Indication: CAD awaiting Plavix washout prior to OR  No Known Allergies  Patient Measurements: Height: 5\' 7"  (170.2 cm) Weight: 213 lb 3.2 oz (96.707 kg) IBW/kg (Calculated) : 61.6 Heparin Dosing Weight: 83  Vital Signs: Temp: 98.1 F (36.7 C) (03/31 0505) Temp Source: Oral (03/31 0505) BP: 129/79 mmHg (03/31 0853) Pulse Rate: 81 (03/31 0853)  Labs:  Recent Labs  06/12/15 1932 06/13/15 0400 06/13/15 0904 06/14/15 0325 06/14/15 0740 06/15/15 0453 06/15/15 1545  HGB  --   --   --   --   --  14.9  --   HCT  --   --   --   --   --  45.6  --   PLT  --   --   --   --   --  316  --   LABPROT  --   --  14.8 14.5  --   --   --   INR  --   --  1.14 1.11  --   --   --   HEPARINUNFRC  --   --   --   --   --   --  0.22*  CREATININE  --   --   --   --  0.82 0.87  --   TROPONINI 0.79* 0.75*  --   --   --   --   --     Estimated Creatinine Clearance: 86.2 mL/min (by C-G formula based on Cr of 0.87).   Medical History: Past Medical History  Diagnosis Date  . Hypertension   . Lupus ([place]) dx'd 2008    "they think I have this; have to get retested" (06/12/2015)  . 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 Empty sella ([place])   . Hidradenitis axillaris   . Obesity (BMI 30.0-34.9)   . Ovarian cyst   . Scoliosis   . Hyperlipemia   . Anxiety and depression   . Tobacco abuse   . Kidney stones   . PONV (postoperative nausea and vomiting)   . Anxiety   . Pneumonia X 2  . Type II diabetes mellitus ([place])   . Rheumatoid arthritis ([place])   . Stroke [facility]) March 2014    left MCA branches; "they say I have them all the time", denies residual on 06/12/2015    Medications:  Scheduled:  . albuterol  2.5 mg Nebulization Once  . aspirin  324 mg Oral Once  . aspirin  81 mg Oral Daily  . atorvastatin  40 mg Oral q1800  . canagliflozin  300 mg Oral Daily  . doxycycline  100 mg Oral Q12H  . gi cocktail  30 mL Oral Once  .  hydrochlorothiazide  25 mg Oral Daily  . insulin aspart  0-15 Units Subcutaneous TID WC  . insulin aspart  0-5 Units Subcutaneous QHS  . insulin aspart  4 Units Subcutaneous TID WC  . insulin glargine  5 Units Subcutaneous QHS  . irbesartan  150 mg Oral Daily  . metoprolol tartrate  25 mg Oral BID  . mometasone-formoterol  2 puff Inhalation BID  . multivitamins with iron  1 tablet Oral Daily  . nitroGLYCERIN  0.5 inch Topical 3 times per day  . sodium chloride flush  3 mL Intravenous Q12H    Assessment: 55 yo female admitted with ACS, cath showed severe 3V CAD, planning for CABG on Tuesday after Plavix washout.  Pharmacy asked by  Dr. Prescott Gum to start IV heparin until procedure.  No anticoag PTA. First HL came back slightly low at 0.22.   Goal of Therapy:  Heparin level 0.3-0.7 units/ml Monitor platelets by anticoagulation protocol: Yes   Plan:  Increase heparin gtt to 1400 units/hr Check 6 hr HL Monitor daily HL, CBC, s/s of bleed  Elenor Quinones, PharmD, [facility] Clinical Pharmacist Pager [phone] 06/15/2015 4:45 PM

## 2015-06-15 NOTE — Progress Notes (Signed)
Patient Name: [name] [name] Date of Encounter: 06/15/2015   SUBJECTIVE  Complain of diet. No chest pain and SOB. Dr. Darcey Nora note reviewed.   CURRENT MEDS . albuterol  2.5 mg Nebulization Once  . aspirin  324 mg Oral Once  . aspirin  81 mg Oral Daily  . atorvastatin  40 mg Oral q1800  . canagliflozin  300 mg Oral Daily  . doxycycline  100 mg Oral Q12H  . gi cocktail  30 mL Oral Once  . heparin  4,000 Units Intravenous Once  . hydrochlorothiazide  25 mg Oral Daily  . insulin aspart  0-15 Units Subcutaneous TID WC  . insulin aspart  0-5 Units Subcutaneous QHS  . insulin aspart  4 Units Subcutaneous TID WC  . insulin glargine  5 Units Subcutaneous QHS  . irbesartan  150 mg Oral Daily  . metoprolol tartrate  25 mg Oral BID  . mometasone-formoterol  2 puff Inhalation BID  . multivitamins with iron  1 tablet Oral Daily  . nitroGLYCERIN  0.5 inch Topical 3 times per day  . sodium chloride flush  3 mL Intravenous Q12H    OBJECTIVE  Filed Vitals:   06/14/15 1953 06/14/15 2155 06/15/15 0505 06/15/15 0853  BP:  136/71 116/72 129/79  Pulse:  80 79 81  Temp:  97.9 F (36.6 C) 98.1 F (36.7 C)   TempSrc:  Oral Oral   Resp:  18 18   Height:      Weight:   213 lb 3.2 oz (96.707 kg)   SpO2: 96% 97% 100%     Intake/Output Summary (Last 24 hours) at 06/15/15 0930 Last data filed at 06/15/15 0400  Gross per 24 hour  Intake   1200 ml  Output      0 ml  Net   1200 ml   Filed Weights   06/13/15 0355 06/14/15 0540 06/15/15 0505  Weight: 214 lb 6.4 oz (97.251 kg) 215 lb 9.6 oz (97.796 kg) 213 lb 3.2 oz (96.707 kg)    PHYSICAL EXAM  General: Pleasant, NAD. Neuro: Alert and oriented X 3. Moves all extremities spontaneously. Psych: Normal affect. HEENT:  Normal  Neck: Supple without bruits or JVD. Lungs:  Resp regular and unlabored, CTA with course breath sound. Just had nebulizer treatment.  Heart: RRR no s3, s4, or murmurs. Abdomen: Soft, non-tender, non-distended, BS +  x 4.  Extremities: No clubbing, cyanosis or edema. DP/PT/Radials 2+ and equal bilaterally. R radial cath site without hematoma.   Accessory Clinical Findings  CBC  Recent Labs  06/12/15 1231 06/15/15 0453  WBC 9.6 10.4  HGB 16.2* 14.9  HCT 48.9* 45.6  MCV 89.7 89.2  PLT 320 [id number]   Basic Metabolic Panel  Recent Labs  06/14/15 0740 06/15/15 0453  NA 139 137  K 4.1 3.8  CL 105 102  CO2 23 21*  GLUCOSE 191* 223*  BUN 15 16  CREATININE 0.82 0.87  CALCIUM 9.5 9.9   Liver Function Tests  Recent Labs  06/12/15 1231 06/14/15 0325  AST 26 23  ALT 24 21  ALKPHOS 88 81  BILITOT 0.6 0.5  PROT 8.1 7.5  ALBUMIN 3.8 3.4*   No results for input(s): LIPASE, AMYLASE in the last 72 hours. Cardiac Enzymes  Recent Labs  06/12/15 1932 06/13/15 0400  TROPONINI 0.79* 0.75*   BNP Invalid input(s): POCBNP D-Dimer  Recent Labs  06/13/15 0904  DDIMER <0.27   Hemoglobin A1C  Recent Labs  06/12/15 1655  HGBA1C 8.9*   Fasting Lipid Panel  Recent Labs  06/12/15 1514  CHOL 308*  HDL 52  LDLCALC 197*  TRIG 296*  CHOLHDL 5.9   Thyroid Function Tests  Recent Labs  06/13/15 0904  TSH 0.943    TELE  Sinus rhythm  Radiology/Studies  Dg Chest 2 View  06/12/2015  CLINICAL DATA:  Sharp mid chest pain radiating into the right arm EXAM: CHEST  2 VIEW COMPARISON:  04/11/2012 FINDINGS: There is mild right middle lobe atelectasis. There is no focal parenchymal opacity. There is no pleural effusion or pneumothorax. The heart and mediastinal contours are unremarkable. The osseous structures are unremarkable. IMPRESSION: No active cardiopulmonary disease. Electronically Signed   By: Kathreen Devoid   On: 06/12/2015 13:00   Dg Cervical Spine With Flex & Extend  06/12/2015  CLINICAL DATA:  Neck pain and bilateral hand numbness and tingling for 2 weeks with no known injury EXAM: CERVICAL SPINE COMPLETE WITH FLEXION AND EXTENSION VIEWS COMPARISON:  None. FINDINGS: Normal  alignment with no prevertebral soft tissue swelling. No fracture. There is degenerative change at the atlantodental articulation. Moderate C4-5, C5-6, and C6-7 degenerative disc disease with mild degenerative disc disease at C7-T1. Prominent anterior osteophytes are noted at C4-5, C5-6, and C6-7. There is mild C5-6 and C6-7 facet arthropathy. There is no significant foraminal narrowing. IMPRESSION: Degenerative changes with no acute findings Electronically Signed   By: Skipper Cliche M.D.   On: 06/12/2015 15:08   Cath 06/13/15 Left Heart Cath and Coronary Angiography    Conclusion     Ost RCA to Mid RCA lesion, 80% stenosed.  Prox LAD lesion, 85% stenosed.  Lat 1st Mrg-1 lesion, 90% stenosed.  Lat 1st Mrg-2 lesion, 85% stenosed.  The left ventricular systolic function is normal.  1. Severe 3 vessel obstructive CAD 2. Normal LV function  Plan: I reviewed study with Dr. Burt Knack. She has complex 3 vessel disease. Percutaneous intervention would be difficult given marked tortuosity of the LCx/OM and diffuse disease in the RCA. With history of DM she may be more appropriately treated with CABG. Will consult surgery. If she is felt not to be a surgical candidate then complex PCI would be an option. For future cath procedures I would recommend a femoral approach.    ASSESSMENT AND PLAN  1. NSTEMI (non-ST elevated myocardial infarction) ([facility]) - Troponin of 0.79-->0.75. Cath showed evere three-vessel coronary artery disease with tortuous coronary vessels from her history of hypertension. Seen by surgery plan for CABG this admission once washed out plavix. P2Y12 of 182. LDL 197. A1c of 8.9. Needs better control of DM and cholesterol.  - On Zocor to 40mg --> consider change to lipitor 80mg  and adding BB.    Otherwise per primary   Lupus ([place])   History of stroke   HTN (hypertension)   Rheumatoid arthritis ([place])   Obesity (BMI 30.0-34.9)   Type 2 diabetes mellitus, uncontrolled (HCC)    Tobacco abuse   Dyslipidemia-LDL 197   Pain in the chest   Diabetes mellitus without complication ([place])     -CABG Tuesday 06/19/15. -P2Y12 182, mildly inhibited. Off Plavix. Washout -Changed zocor to atorva 40mg .  -Stopped amlodipine and started metoprolol 25mg  BID  Candee Furbish, MD

## 2015-06-15 NOTE — Progress Notes (Signed)
CARDIAC REHAB PHASE I   PRE:  Rate/Rhythm: 80 SR  BP:  Supine:   Sitting: 122/75  Standing:    SaO2: 97%RA  MODE:  Ambulation: 550 ft   POST:  Rate/Rhythm: 77 SR  BP:  Supine:   Sitting: 125/79  Standing:    SaO2: 97%RA 1100-1145 Pt in better spirits today. Very talkative. Pt walked 550 ft on RA with steady gait and no CP. Pt has IS and stated no difficulty in use. Stated she has started looking at [facility] booklet but has not seen video yet. Discussed with pt importance of walking and IS after surgery and that she would need to adhere to sternal precautions. Pt stated she is aware of all that. Discussed with pt that she will need someone with her 24/7 after discharge and she stated that would have to be worked out as her son works nights and sleeps during day plus has a 57 year old. Will need to see case manager after surgery. 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

## 2015-06-15 NOTE — Progress Notes (Signed)
[facility]                                                                              Patient Demographics  [name] [name], is a 55 y.o. female, DOB - Sep 17, 1960, [id number]  Admit date - 06/12/2015   Admitting Physician Waldemar Dickens, MD  Outpatient Primary MD for the patient is [name], [name], MD  LOS -   days    Chief Complaint  Patient presents with  . Chest Pain       Brief HPI   [name] [name] is a 55 y.o. female with a past medical history that includes obesity diabetes, hypertension, previous stroke, hepatitis C, rheumatoid arthritis presented with chest pain. Patient reported chronic general pain related to rheumatoid arthritis but stated aslo intermittent "different" pain over the last 3-4 days. She reports right anterior chest pain radiating to bilateral jaw associated with a feeling of fullness in her throat worse with cough. In addition she reports bilateral hand numbness and tingling.   Assessment & Plan    Principal Problem:   NSTEMI (non-ST elevated myocardial infarction) ([place])-  Risk factors include obesity, diabetes, hypertension, hyperlipidemia, smoking -  Troponins positive, d-dimer less than 0.27  - Hardy cath 3/29 showed severe three-vessel obstructive CAD with normal LV function. Patient was recommended CABG. Patient has been seen by cardiothoracic surgery, defer timing of CABG to CTVS - 2-D echo showed EF of 50-55%  Diabetes mellitus, uncontrolled - Hemoglobin A1c 8.9, patient absolutely does not want to go on insulin.  - place on sliding scale insulin, diabetic coordinator consult, nutrition. Patient admits that she does not follow carb modified diet as her children has been doing the grocery shopping. - In the setting of pending surgery, will control her blood sugars more aggressively, placed on meal coverage, Lantus, continue sliding scale insulin. Blood sugars better controlled  Hypertension. Blood pressure  controlled in the emergency department. Home medications include HCTZ, amlodipine -Per cardiology, stop amlodipine and patient placed on beta blocker  Hyperlipidemia - Cholesterol 208, triglycerides 296, LDL 197, continue statin - Zocor changed to atorvastatin 80 mg   History of CVA : Recent on Plavix. No deficits   Tobacco use. - Patient counseled on tobacco cessation, she declined a nicotine patch   Depression - Patient's husband had passed last year, his birthday was in April, hence patient is feeling anxious. She however declines to be on any antianxiety or antidepressants.  Boils in the axilla likely due to staph infection, patient reports recurrent boils - Placed on doxycycline on 3/29  Code Status: Full code   Family Communication: Discussed in detail with the patient, all imaging results, lab results explained to the patient   Disposition Plan: CABG planned next week  Time Spent in minutes   15 minutes  Procedures  Cardiac cath  Consults   Cardiology CTVS  DVT Prophylaxis  Lovenox  Medications  Scheduled Meds: . albuterol  2.5 mg Nebulization Once  . aspirin  324 mg Oral Once  . aspirin  81 mg Oral Daily  . atorvastatin  40 mg Oral q1800  . canagliflozin  300 mg  Oral Daily  . doxycycline  100 mg Oral Q12H  . gi cocktail  30 mL Oral Once  . hydrochlorothiazide  25 mg Oral Daily  . insulin aspart  0-15 Units Subcutaneous TID WC  . insulin aspart  0-5 Units Subcutaneous QHS  . insulin aspart  4 Units Subcutaneous TID WC  . insulin glargine  5 Units Subcutaneous QHS  . irbesartan  150 mg Oral Daily  . metoprolol tartrate  25 mg Oral BID  . mometasone-formoterol  2 puff Inhalation BID  . multivitamins with iron  1 tablet Oral Daily  . nitroGLYCERIN  0.5 inch Topical 3 times per day  . sodium chloride flush  3 mL Intravenous Q12H   Continuous Infusions: . heparin 1,250 Units/hr (06/15/15 0958)   PRN Meds:.sodium chloride, acetaminophen, gi cocktail,  ipratropium-albuterol, morphine injection, nitroGLYCERIN, ondansetron (ZOFRAN) IV, sodium chloride flush   Antibiotics   Anti-infectives    Start     Dose/Rate Route Frequency Ordered Stop   06/14/15 0930  fluconazole (DIFLUCAN) tablet 150 mg     150 mg Oral  Once 06/14/15 0855 06/14/15 0950   06/13/15 1000  doxycycline (VIBRA-TABS) tablet 100 mg     100 mg Oral Every 12 hours 06/13/15 0831          Subjective:   [name] [name] was seen and examined today.  Patient denied any chest pain. Patient denies dizziness,  abdominal pain, N/V/D/C, new weakness, numbess, tingling. No acute events overnight.    Objective:   Filed Vitals:   06/14/15 1953 06/14/15 2155 06/15/15 0505 06/15/15 0853  BP:  136/71 116/72 129/79  Pulse:  80 79 81  Temp:  97.9 F (36.6 C) 98.1 F (36.7 C)   TempSrc:  Oral Oral   Resp:  18 18   Height:      Weight:   96.707 kg (213 lb 3.2 oz)   SpO2: 96% 97% 100%     Intake/Output Summary (Last 24 hours) at 06/15/15 1225 Last data filed at 06/15/15 0400  Gross per 24 hour  Intake    960 ml  Output      0 ml  Net    960 ml     Wt Readings from Last 3 Encounters:  06/15/15 96.707 kg (213 lb 3.2 oz)  11/17/12 99.474 kg (219 lb 4.8 oz)  10/05/12 100.245 kg (221 lb)     Exam  General: Alert and oriented x 3, NAD  HEENT:    Neck:   CVS: S1 S2 clear, RRR   Respiratory: Clear to auscultation bilaterally, no wheezing, rales or rhonchi  Abdomen: Soft, nontender, nondistended, + bowel sounds  Ext: no cyanosis clubbing or edema  Neuro: no new deficits  Skin: No rashes  Psych: Normal affect and demeanor, alert and oriented x3    Data Reviewed:  I have personally reviewed following labs and imaging studies  Micro Results Recent Results (from the past 240 hour(s))  Surgical pcr screen     Status: None   Collection Time: 06/13/15 10:37 PM  Result Value Ref Range Status   MRSA, PCR NEGATIVE NEGATIVE Final   Staphylococcus aureus NEGATIVE  NEGATIVE Final    Comment:        The Xpert SA Assay (FDA approved for NASAL specimens in patients over 51 years of age), is one component of a comprehensive surveillance program.  Test performance has been validated by [facility] for patients greater than or equal to 69 year old. It is not  intended to diagnose infection nor to guide or monitor treatment.     Radiology Reports Dg Chest 2 View  06/12/2015  CLINICAL DATA:  Sharp mid chest pain radiating into the right arm EXAM: CHEST  2 VIEW COMPARISON:  04/11/2012 FINDINGS: There is mild right middle lobe atelectasis. There is no focal parenchymal opacity. There is no pleural effusion or pneumothorax. The heart and mediastinal contours are unremarkable. The osseous structures are unremarkable. IMPRESSION: No active cardiopulmonary disease. Electronically Signed   By: Kathreen Devoid   On: 06/12/2015 13:00   Dg Cervical Spine With Flex & Extend  06/12/2015  CLINICAL DATA:  Neck pain and bilateral hand numbness and tingling for 2 weeks with no known injury EXAM: CERVICAL SPINE COMPLETE WITH FLEXION AND EXTENSION VIEWS COMPARISON:  None. FINDINGS: Normal alignment with no prevertebral soft tissue swelling. No fracture. There is degenerative change at the atlantodental articulation. Moderate C4-5, C5-6, and C6-7 degenerative disc disease with mild degenerative disc disease at C7-T1. Prominent anterior osteophytes are noted at C4-5, C5-6, and C6-7. There is mild C5-6 and C6-7 facet arthropathy. There is no significant foraminal narrowing. IMPRESSION: Degenerative changes with no acute findings Electronically Signed   By: Skipper Cliche M.D.   On: 06/12/2015 15:08    CBC  Recent Labs Lab 06/12/15 1231 06/15/15 0453  WBC 9.6 10.4  HGB 16.2* 14.9  HCT 48.9* 45.6  PLT 320 316  MCV 89.7 89.2  MCH 29.7 29.2  MCHC 33.1 32.7  RDW 15.2 15.4    Chemistries   Recent Labs Lab 06/12/15 1231 06/14/15 0325 06/14/15 0740 06/15/15 0453  NA  140  --  139 137  K 4.0  --  4.1 3.8  CL 103  --  105 102  CO2 26  --  23 21*  GLUCOSE 171*  --  191* 223*  BUN 11  --  15 16  CREATININE 0.76  --  0.82 0.87  CALCIUM 10.1  --  9.5 9.9  AST 26 23  --   --   ALT 24 21  --   --   ALKPHOS 88 81  --   --   BILITOT 0.6 0.5  --   --    ------------------------------------------------------------------------------------------------------------------ estimated creatinine clearance is 86.2 mL/min (by C-G formula based on Cr of 0.87). ------------------------------------------------------------------------------------------------------------------  Recent Labs  06/12/15 1655  HGBA1C 8.9*   ------------------------------------------------------------------------------------------------------------------  Recent Labs  06/12/15 1514  CHOL 308*  HDL 52  LDLCALC 197*  TRIG 296*  CHOLHDL 5.9   ------------------------------------------------------------------------------------------------------------------  Recent Labs  06/13/15 0904  TSH 0.943   ------------------------------------------------------------------------------------------------------------------ No results for input(s): VITAMINB12, FOLATE, FERRITIN, TIBC, IRON, RETICCTPCT in the last 72 hours.  Coagulation profile  Recent Labs Lab 06/13/15 0904 06/14/15 0325  INR 1.14 1.11     Recent Labs  06/13/15 0904  DDIMER <0.27    Cardiac Enzymes  Recent Labs Lab 06/12/15 1932 06/13/15 0400  TROPONINI 0.79* 0.75*   ------------------------------------------------------------------------------------------------------------------ Invalid input(s): POCBNP   Recent Labs  06/13/15 2123 06/14/15 0741 06/14/15 1626 06/14/15 2157 06/15/15 0721 06/15/15 1210  GLUCAP 191* 204* 206* 146* 177* 162*     RAI,RIPUDEEP M.D. [facility] 06/15/2015, 12:25 PM  Pager: [phone] Between 7am to 7pm - call Pager - [phone]  After 7pm go to [email or web address] -  password [email or web address]  Call night coverage person covering after 7pm

## 2015-06-16 DIAGNOSIS — R9439 Abnormal result of other cardiovascular function study: Secondary | ICD-10-CM

## 2015-06-16 LAB — BASIC METABOLIC PANEL
Anion gap: 14 (ref 5–15)
BUN: 19 mg/dL (ref 6–20)
CO2: 20 mmol/L — ABNORMAL LOW (ref 22–32)
Calcium: 10 mg/dL (ref 8.9–10.3)
Chloride: 103 mmol/L (ref 101–111)
Creatinine, Ser: 0.78 mg/dL (ref 0.44–1.00)
GFR calc Af Amer: >60 mL/min (ref >60)
GFR calc non Af Amer: >60 mL/min (ref >60)
Glucose, Bld: 158 mg/dL — ABNORMAL HIGH (ref 65–99)
Potassium: 4 mmol/L (ref 3.5–5.1)
Sodium: 137 mmol/L (ref 135–145)

## 2015-06-16 LAB — CBC
HCT: 46.8 % — ABNORMAL HIGH (ref 36.0–46.0)
Hemoglobin: 15.7 g/dL — ABNORMAL HIGH (ref 12.0–15.0)
MCH: 29.7 pg (ref 26.0–34.0)
MCHC: 33.5 g/dL (ref 30.0–36.0)
MCV: 88.6 fL (ref 78.0–100.0)
Platelets: 257 10*3/uL (ref 150–400)
RBC: 5.28 MIL/uL — ABNORMAL HIGH (ref 3.87–5.11)
RDW: 15.2 % (ref 11.5–15.5)
WBC: 11 10*3/uL — ABNORMAL HIGH (ref 4.0–10.5)

## 2015-06-16 LAB — GLUCOSE, CAPILLARY
Glucose-Capillary: 205 mg/dL — ABNORMAL HIGH (ref 65–99)
Glucose-Capillary: 217 mg/dL — ABNORMAL HIGH (ref 65–99)
Glucose-Capillary: 164 mg/dL — ABNORMAL HIGH (ref 65–99)
Glucose-Capillary: 192 mg/dL — ABNORMAL HIGH (ref 65–99)

## 2015-06-16 LAB — HEPARIN LEVEL (UNFRACTIONATED)
Heparin Unfractionated: 0.43 IU/mL (ref 0.30–0.70)
Heparin Unfractionated: 0.46 IU/mL (ref 0.30–0.70)

## 2015-06-16 NOTE — Progress Notes (Signed)
ANTICOAGULATION CONSULT NOTE - Follow Up Consult  Pharmacy Consult for Heparin  Indication: Awaiting CABG  No Known Allergies  Patient Measurements: Height: 5\' 7"  (170.2 cm) Weight: 213 lb 3.2 oz (96.707 kg) IBW/kg (Calculated) : 61.6  Vital Signs: Temp: 98.9 F (37.2 C) (03/31 1950) Temp Source: Oral (03/31 1950) BP: 125/79 mmHg (03/31 1950) Pulse Rate: 87 (03/31 1950)  Labs:  Recent Labs  06/13/15 0400 06/13/15 0904 06/14/15 0325 06/14/15 0740 06/15/15 0453 06/15/15 1545 06/15/15 2346  HGB  --   --   --   --  14.9  --   --   HCT  --   --   --   --  45.6  --   --   PLT  --   --   --   --  316  --   --   LABPROT  --  14.8 14.5  --   --   --   --   INR  --  1.14 1.11  --   --   --   --   HEPARINUNFRC  --   --   --   --   --  0.22* 0.46  CREATININE  --   --   --  0.82 0.87  --   --   TROPONINI 0.75*  --   --   --   --   --   --     Estimated Creatinine Clearance: 86.2 mL/min (by C-G formula based on Cr of 0.87).  Assessment: On heparin awaiting CABG, heparin level is therapeutic x 1 after rate increase  Goal of Therapy:  Heparin level 0.3-0.7 units/ml Monitor platelets by anticoagulation protocol: Yes   Plan:  -Cont heparin at 1400 units/hr -Confirmatory HL with AM labs  Narda Bonds 06/16/2015,12:37 AM

## 2015-06-16 NOTE — Progress Notes (Addendum)
ANTICOAGULATION CONSULT NOTE - Follow Up Consult  Pharmacy Consult for Heparin  Indication: Awaiting CABG  No Known Allergies  Patient Measurements: Height: 5\' 7"  (170.2 cm) Weight: 211 lb 9.6 oz (95.981 kg) IBW/kg (Calculated) : 61.6  Vital Signs: Temp: 98 F (36.7 C) (04/01 0540) Temp Source: Oral (04/01 0540) BP: 127/74 mmHg (04/01 0540) Pulse Rate: 76 (04/01 0540)  Labs:  Recent Labs  06/13/15 0904 06/14/15 0325 06/14/15 0740 06/15/15 0453 06/15/15 1545 06/15/15 2346 06/16/15 0420 06/16/15 0428  HGB  --   --   --  14.9  --   --  15.7*  --   HCT  --   --   --  45.6  --   --  46.8*  --   PLT  --   --   --  316  --   --  257  --   LABPROT 14.8 14.5  --   --   --   --   --   --   INR 1.14 1.11  --   --   --   --   --   --   HEPARINUNFRC  --   --   --   --  0.22* 0.46  --  0.43  CREATININE  --   --  0.82 0.87  --   --  0.78  --     Estimated Creatinine Clearance: 93.5 mL/min (by C-G formula based on Cr of 0.78).  Assessment: 55 yo woman admitted 06/12/2015 for NSTEMI found to have severe 3-vessel disease. Pharmacy consulted to dose heparin awaiting CABG after Plavix washout.  PMH obesity, diabetes, hypertension, previous stroke, hepatitis C, rheumatoid arthritis   HL 0.43 therapeutic on heparin 1400 units/h. Hgb 15.7, plt wnl. No noted bleeding. 3/31 P2Y12 150.   Goal of Therapy:  Heparin level 0.3-0.7 units/ml Monitor platelets by anticoagulation protocol: Yes   Plan:  -Continue heparin 1400 units/hr -Daily HL, CBC -Monitor s/sx bleeding -Planned CABG Tuesday   Heloise Ochoa, [place].D., BCPS PGY2 Cardiology Pharmacy Resident Pager: [phone] 06/16/2015,8:58 AM

## 2015-06-16 NOTE — Progress Notes (Signed)
[facility]                                                                              Patient Demographics  [name] [name], is a 55 y.o. female, DOB - 01/24/1961, [id number]  Admit date - 06/12/2015   Admitting Physician Waldemar Dickens, MD  Outpatient Primary MD for the patient is [name], [name], MD  LOS -   days    Chief Complaint  Patient presents with  . Chest Pain       Brief HPI   [name] [name] is a 55 y.o. female with a past medical history that includes obesity diabetes, hypertension, previous stroke, hepatitis C, rheumatoid arthritis presented with chest pain. Patient reported chronic general pain related to rheumatoid arthritis but stated aslo intermittent "different" pain over the last 3-4 days. She reports right anterior chest pain radiating to bilateral jaw associated with a feeling of fullness in her throat worse with cough. In addition she reports bilateral hand numbness and tingling.   Assessment & Plan    Principal Problem:   NSTEMI (non-ST elevated myocardial infarction) ([place])-  Risk factors include obesity, diabetes, hypertension, hyperlipidemia, smoking -  Troponins positive, d-dimer less than 0.27  - Hardy cath 3/29 showed severe three-vessel obstructive CAD with normal LV function. Patient was recommended CABG. Patient has been seen by cardiothoracic surgery, defer timing of CABG to CTVS - 2-D echo showed EF of 50-55%  Diabetes mellitus, uncontrolled - Hemoglobin A1c 8.9, patient absolutely does not want to go on insulin.  - place on sliding scale insulin, diabetic coordinator consult, nutrition. Patient admits that she does not follow carb modified diet as her children has been doing the grocery shopping. - In the setting of pending surgery, will control her blood sugars more aggressively, placed on meal coverage, Lantus, continue sliding scale insulin. Blood sugars better controlled  Hypertension. Blood pressure  controlled in the emergency department. Home medications include HCTZ, amlodipine -Per cardiology, stop amlodipine and patient placed on beta blocker  Hyperlipidemia - Cholesterol 208, triglycerides 296, LDL 197, continue statin - Zocor changed to atorvastatin 80 mg   History of CVA : Recent on Plavix. No deficits   Tobacco use. - Patient counseled on tobacco cessation, she declined a nicotine patch   Depression - Patient's husband had passed last year, his birthday was in April, hence patient is feeling anxious. She however declines to be on any antianxiety or antidepressants.  Boils in the axilla likely due to staph infection, patient reports recurrent boils - Placed on doxycycline on 3/29  Code Status: Full code   Family Communication: Discussed in detail with the patient, all imaging results, lab results explained to the patient   Disposition Plan: CABG planned next week  Time Spent in minutes   15 minutes  Procedures  Cardiac cath  Consults   Cardiology CTVS  DVT Prophylaxis  Lovenox  Medications  Scheduled Meds: . albuterol  2.5 mg Nebulization Once  . aspirin  324 mg Oral Once  . aspirin  81 mg Oral Daily  . atorvastatin  40 mg Oral q1800  . canagliflozin  300 mg  Oral Daily  . doxycycline  100 mg Oral Q12H  . gi cocktail  30 mL Oral Once  . hydrochlorothiazide  25 mg Oral Daily  . insulin aspart  0-15 Units Subcutaneous TID WC  . insulin aspart  0-5 Units Subcutaneous QHS  . insulin aspart  4 Units Subcutaneous TID WC  . insulin glargine  5 Units Subcutaneous QHS  . irbesartan  150 mg Oral Daily  . metoprolol tartrate  25 mg Oral BID  . mometasone-formoterol  2 puff Inhalation BID  . multivitamins with iron  1 tablet Oral Daily  . nitroGLYCERIN  0.5 inch Topical 3 times per day  . sodium chloride flush  3 mL Intravenous Q12H   Continuous Infusions: . heparin 1,400 Units/hr (06/15/15 2348)   PRN Meds:.sodium chloride, acetaminophen, gi cocktail,  ipratropium-albuterol, morphine injection, nitroGLYCERIN, ondansetron (ZOFRAN) IV, sodium chloride flush   Antibiotics   Anti-infectives    Start     Dose/Rate Route Frequency Ordered Stop   06/14/15 0930  fluconazole (DIFLUCAN) tablet 150 mg     150 mg Oral  Once 06/14/15 0855 06/14/15 0950   06/13/15 1000  doxycycline (VIBRA-TABS) tablet 100 mg     100 mg Oral Every 12 hours 06/13/15 0831          Subjective:   [name] [name] was seen and examined today. No acute issues, no complaints. Awaiting CABG early next week, denies dizziness,  abdominal pain, N/V/D/C, new weakness, numbess, tingling. No acute events overnight.    Objective:   Filed Vitals:   06/15/15 1950 06/15/15 2138 06/16/15 0540 06/16/15 0851  BP: 125/79  127/74   Pulse: 87  76   Temp: 98.9 F (37.2 C)  98 F (36.7 C)   TempSrc: Oral  Oral   Resp: 18  18   Height:      Weight:   95.981 kg (211 lb 9.6 oz)   SpO2: 97% 98% 95% 97%    Intake/Output Summary (Last 24 hours) at 06/16/15 1053 Last data filed at 06/15/15 2105  Gross per 24 hour  Intake    480 ml  Output    910 ml  Net   -430 ml     Wt Readings from Last 3 Encounters:  06/16/15 95.981 kg (211 lb 9.6 oz)  11/17/12 99.474 kg (219 lb 4.8 oz)  10/05/12 100.245 kg (221 lb)     Exam  General: Alert and oriented x 3, NAD  HEENT:    Neck:   CVS: S1 S2 clear, RRR   Respiratory: CTAB  Abdomen: Soft, nontender, nondistended, + bowel sounds  Ext: no cyanosis clubbing or edema  Neuro: no new deficits  Skin: No rashes  Psych: Normal affect and demeanor, alert and oriented x3    Data Reviewed:  I have personally reviewed following labs and imaging studies  Micro Results Recent Results (from the past 240 hour(s))  Surgical pcr screen     Status: None   Collection Time: 06/13/15 10:37 PM  Result Value Ref Range Status   MRSA, PCR NEGATIVE NEGATIVE Final   Staphylococcus aureus NEGATIVE NEGATIVE Final    Comment:        The Xpert  SA Assay (FDA approved for NASAL specimens in patients over 62 years of age), is one component of a comprehensive surveillance program.  Test performance has been validated by [facility] for patients greater than or equal to 57 year old. It is not intended to diagnose infection nor to guide or  monitor treatment.     Radiology Reports Dg Chest 2 View  06/12/2015  CLINICAL DATA:  Sharp mid chest pain radiating into the right arm EXAM: CHEST  2 VIEW COMPARISON:  04/11/2012 FINDINGS: There is mild right middle lobe atelectasis. There is no focal parenchymal opacity. There is no pleural effusion or pneumothorax. The heart and mediastinal contours are unremarkable. The osseous structures are unremarkable. IMPRESSION: No active cardiopulmonary disease. Electronically Signed   By: Kathreen Devoid   On: 06/12/2015 13:00   Dg Cervical Spine With Flex & Extend  06/12/2015  CLINICAL DATA:  Neck pain and bilateral hand numbness and tingling for 2 weeks with no known injury EXAM: CERVICAL SPINE COMPLETE WITH FLEXION AND EXTENSION VIEWS COMPARISON:  None. FINDINGS: Normal alignment with no prevertebral soft tissue swelling. No fracture. There is degenerative change at the atlantodental articulation. Moderate C4-5, C5-6, and C6-7 degenerative disc disease with mild degenerative disc disease at C7-T1. Prominent anterior osteophytes are noted at C4-5, C5-6, and C6-7. There is mild C5-6 and C6-7 facet arthropathy. There is no significant foraminal narrowing. IMPRESSION: Degenerative changes with no acute findings Electronically Signed   By: Skipper Cliche M.D.   On: 06/12/2015 15:08    CBC  Recent Labs Lab 06/12/15 1231 06/15/15 0453 06/16/15 0420  WBC 9.6 10.4 11.0*  HGB 16.2* 14.9 15.7*  HCT 48.9* 45.6 46.8*  PLT 320 316 257  MCV 89.7 89.2 88.6  MCH 29.7 29.2 29.7  MCHC 33.1 32.7 33.5  RDW 15.2 15.4 15.2    Chemistries   Recent Labs Lab 06/12/15 1231 06/14/15 0325 06/14/15 0740  06/15/15 0453 06/16/15 0420  NA 140  --  139 137 137  K 4.0  --  4.1 3.8 4.0  CL 103  --  105 102 103  CO2 26  --  23 21* 20*  GLUCOSE 171*  --  191* 223* 158*  BUN 11  --  15 16 19   CREATININE 0.76  --  0.82 0.87 0.78  CALCIUM 10.1  --  9.5 9.9 10.0  AST 26 23  --   --   --   ALT 24 21  --   --   --   ALKPHOS 88 81  --   --   --   BILITOT 0.6 0.5  --   --   --    ------------------------------------------------------------------------------------------------------------------ estimated creatinine clearance is 93.5 mL/min (by C-G formula based on Cr of 0.78). ------------------------------------------------------------------------------------------------------------------ No results for input(s): HGBA1C in the last 72 hours. ------------------------------------------------------------------------------------------------------------------ No results for input(s): CHOL, HDL, LDLCALC, TRIG, CHOLHDL, LDLDIRECT in the last 72 hours. ------------------------------------------------------------------------------------------------------------------ No results for input(s): TSH, T4TOTAL, T3FREE, THYROIDAB in the last 72 hours.  Invalid input(s): FREET3 ------------------------------------------------------------------------------------------------------------------ No results for input(s): VITAMINB12, FOLATE, FERRITIN, TIBC, IRON, RETICCTPCT in the last 72 hours.  Coagulation profile  Recent Labs Lab 06/13/15 0904 06/14/15 0325  INR 1.14 1.11    No results for input(s): DDIMER in the last 72 hours.  Cardiac Enzymes  Recent Labs Lab 06/12/15 1932 06/13/15 0400  TROPONINI 0.79* 0.75*   ------------------------------------------------------------------------------------------------------------------ Invalid input(s): POCBNP   Recent Labs  06/14/15 2157 06/15/15 0721 06/15/15 1210 06/15/15 1612 06/15/15 2137 06/16/15 0725  GLUCAP 146* 177* 162* 169* 180* 205*      RAI,RIPUDEEP M.D. [facility] 06/16/2015, 10:53 AM  Pager: [phone] Between 7am to 7pm - call Pager - [phone]  After 7pm go to [email or web address] - password [email or web address]  Call night coverage person covering after 7pm

## 2015-06-16 NOTE — Progress Notes (Signed)
CARDIAC REHAB PHASE I   PRE:  Rate/Rhythm: Sinus 77  BP:    Sitting: 115/61     SaO2: 99% room air  MODE:  Ambulation: 800 ft   POST:  Rate/Rhythem: 78  BP:    Sitting: 120/67     SaO2: 99% room air  [name] [name] Patient  Ambulated in the hallway  Without complaints or symptoms, tolerated ambulation without difficulty. Patient assisted  back to bed  With family present.

## 2015-06-16 NOTE — Progress Notes (Signed)
Patient Name: [name] [name] Date of Encounter: 06/16/2015   SUBJECTIVE  No CP or dyspnea  CURRENT MEDS . albuterol  2.5 mg Nebulization Once  . aspirin  324 mg Oral Once  . aspirin  81 mg Oral Daily  . atorvastatin  40 mg Oral q1800  . canagliflozin  300 mg Oral Daily  . doxycycline  100 mg Oral Q12H  . gi cocktail  30 mL Oral Once  . hydrochlorothiazide  25 mg Oral Daily  . insulin aspart  0-15 Units Subcutaneous TID WC  . insulin aspart  0-5 Units Subcutaneous QHS  . insulin aspart  4 Units Subcutaneous TID WC  . insulin glargine  5 Units Subcutaneous QHS  . irbesartan  150 mg Oral Daily  . metoprolol tartrate  25 mg Oral BID  . mometasone-formoterol  2 puff Inhalation BID  . multivitamins with iron  1 tablet Oral Daily  . nitroGLYCERIN  0.5 inch Topical 3 times per day  . sodium chloride flush  3 mL Intravenous Q12H    OBJECTIVE  Filed Vitals:   06/15/15 1950 06/15/15 2138 06/16/15 0540 06/16/15 0851  BP: 125/79  127/74   Pulse: 87  76   Temp: 98.9 F (37.2 C)  98 F (36.7 C)   TempSrc: Oral  Oral   Resp: 18  18   Height:      Weight:   211 lb 9.6 oz (95.981 kg)   SpO2: 97% 98% 95% 97%    Intake/Output Summary (Last 24 hours) at 06/16/15 0929 Last data filed at 06/15/15 2105  Gross per 24 hour  Intake    480 ml  Output    910 ml  Net   -430 ml   Filed Weights   06/14/15 0540 06/15/15 0505 06/16/15 0540  Weight: 215 lb 9.6 oz (97.796 kg) 213 lb 3.2 oz (96.707 kg) 211 lb 9.6 oz (95.981 kg)    PHYSICAL EXAM  General: Pleasant, NAD. Neuro: Alert and oriented X 3. Moves all extremities spontaneously. HEENT:  Normal  Neck: Supple  Lungs:  CTA  Heart: RRR  Abdomen: Soft, non-tender, non-distended Extremities: No edema. R radial cath site without hematoma.   Accessory Clinical Findings  CBC  Recent Labs  06/15/15 0453 06/16/15 0420  WBC 10.4 11.0*  HGB 14.9 15.7*  HCT 45.6 46.8*  MCV 89.2 88.6  PLT 316 [id number]   Basic Metabolic  Panel  Recent Labs  06/15/15 0453 06/16/15 0420  NA 137 137  K 3.8 4.0  CL 102 103  CO2 21* 20*  GLUCOSE 223* 158*  BUN 16 19  CREATININE 0.87 0.78  CALCIUM 9.9 10.0   Liver Function Tests  Recent Labs  06/14/15 0325  AST 23  ALT 21  ALKPHOS 81  BILITOT 0.5  PROT 7.5  ALBUMIN 3.4*    TELE  Sinus rhythm  Radiology/Studies  Dg Chest 2 View  06/12/2015  CLINICAL DATA:  Sharp mid chest pain radiating into the right arm EXAM: CHEST  2 VIEW COMPARISON:  04/11/2012 FINDINGS: There is mild right middle lobe atelectasis. There is no focal parenchymal opacity. There is no pleural effusion or pneumothorax. The heart and mediastinal contours are unremarkable. The osseous structures are unremarkable. IMPRESSION: No active cardiopulmonary disease. Electronically Signed   By: Kathreen Devoid   On: 06/12/2015 13:00   Dg Cervical Spine With Flex & Extend  06/12/2015  CLINICAL DATA:  Neck pain and bilateral hand numbness and tingling for 2 weeks with  no known injury EXAM: CERVICAL SPINE COMPLETE WITH FLEXION AND EXTENSION VIEWS COMPARISON:  None. FINDINGS: Normal alignment with no prevertebral soft tissue swelling. No fracture. There is degenerative change at the atlantodental articulation. Moderate C4-5, C5-6, and C6-7 degenerative disc disease with mild degenerative disc disease at C7-T1. Prominent anterior osteophytes are noted at C4-5, C5-6, and C6-7. There is mild C5-6 and C6-7 facet arthropathy. There is no significant foraminal narrowing. IMPRESSION: Degenerative changes with no acute findings Electronically Signed   By: Skipper Cliche M.D.   On: 06/12/2015 15:08   Cath 06/13/15 Left Heart Cath and Coronary Angiography    Conclusion     Ost RCA to Mid RCA lesion, 80% stenosed.  Prox LAD lesion, 85% stenosed.  Lat 1st Mrg-1 lesion, 90% stenosed.  Lat 1st Mrg-2 lesion, 85% stenosed.  The left ventricular systolic function is normal.  1. Severe 3 vessel obstructive CAD 2.  Normal LV function  Plan: I reviewed study with Dr. Burt Knack. She has complex 3 vessel disease. Percutaneous intervention would be difficult given marked tortuosity of the LCx/OM and diffuse disease in the RCA. With history of DM she may be more appropriately treated with CABG. Will consult surgery. If she is felt not to be a surgical candidate then complex PCI would be an option. For future cath procedures I would recommend a femoral approach.    ASSESSMENT AND PLAN  1. NSTEMI (non-ST elevated myocardial infarction) (HCC) - Troponin of 0.79-->0.75. Cath showed evere three-vessel coronary artery disease with tortuous coronary vessels from her history of hypertension. Seen by surgery plan for CABG 06/19/15. 2. Hyperlipidemia-continue statin. 3. Hypertension-continue present medications.  Kirk Ruths, MD

## 2015-06-17 LAB — CBC
HCT: 44.9 % (ref 36.0–46.0)
Hemoglobin: 14.5 g/dL (ref 12.0–15.0)
MCH: 28.6 pg (ref 26.0–34.0)
MCHC: 32.3 g/dL (ref 30.0–36.0)
MCV: 88.6 fL (ref 78.0–100.0)
Platelets: 309 10*3/uL (ref 150–400)
RBC: 5.07 MIL/uL (ref 3.87–5.11)
RDW: 15.2 % (ref 11.5–15.5)
WBC: 11.5 10*3/uL — ABNORMAL HIGH (ref 4.0–10.5)

## 2015-06-17 LAB — BASIC METABOLIC PANEL
Anion gap: 14 (ref 5–15)
BUN: 23 mg/dL — ABNORMAL HIGH (ref 6–20)
CO2: 24 mmol/L (ref 22–32)
Calcium: 10 mg/dL (ref 8.9–10.3)
Chloride: 99 mmol/L — ABNORMAL LOW (ref 101–111)
Creatinine, Ser: 0.95 mg/dL (ref 0.44–1.00)
GFR calc Af Amer: >60 mL/min (ref >60)
GFR calc non Af Amer: >60 mL/min (ref >60)
Glucose, Bld: 161 mg/dL — ABNORMAL HIGH (ref 65–99)
Potassium: 3.7 mmol/L (ref 3.5–5.1)
Sodium: 137 mmol/L (ref 135–145)

## 2015-06-17 LAB — GLUCOSE, CAPILLARY
Glucose-Capillary: 184 mg/dL — ABNORMAL HIGH (ref 65–99)
Glucose-Capillary: 159 mg/dL — ABNORMAL HIGH (ref 65–99)
Glucose-Capillary: 174 mg/dL — ABNORMAL HIGH (ref 65–99)
Glucose-Capillary: 206 mg/dL — ABNORMAL HIGH (ref 65–99)

## 2015-06-17 LAB — HEPARIN LEVEL (UNFRACTIONATED): Heparin Unfractionated: 0.53 IU/mL (ref 0.30–0.70)

## 2015-06-17 LAB — PLATELET INHIBITION P2Y12: Platelet Function  P2Y12: 191 [PRU] — ABNORMAL LOW (ref 194–418)

## 2015-06-17 MED ORDER — INSULIN GLARGINE 100 UNIT/ML ~~LOC~~ SOLN
8.0000 [IU] | Freq: Every day | SUBCUTANEOUS | Status: DC
  Administered 2015-06-17 – 2015-06-18 (×2): 8 [IU] via SUBCUTANEOUS
  Filled 2015-06-17 (×3): qty 0.08

## 2015-06-17 NOTE — Progress Notes (Signed)
Pt refuses day shift insulin, states they only take it before bed.

## 2015-06-17 NOTE — Progress Notes (Signed)
[facility]                                                                              Patient Demographics  [name] [name], is a 55 y.o. female, DOB - 07-31-1960, [id number]  Admit date - 06/12/2015   Admitting Physician Waldemar Dickens, MD  Outpatient Primary MD for the patient is [name], [name], MD  LOS -   days    Chief Complaint  Patient presents with  . Chest Pain       Brief HPI   [name] [name] is a 55 y.o. female with a past medical history that includes obesity diabetes, hypertension, previous stroke, hepatitis C, rheumatoid arthritis presented with chest pain. Patient reported chronic general pain related to rheumatoid arthritis but stated aslo intermittent "different" pain over the last 3-4 days. She reports right anterior chest pain radiating to bilateral jaw associated with a feeling of fullness in her throat worse with cough. In addition she reports bilateral hand numbness and tingling.   Assessment & Plan    Principal Problem:   NSTEMI (non-ST elevated myocardial infarction) ([place])-  Risk factors include obesity, diabetes, hypertension, hyperlipidemia, smoking -  Troponins positive, d-dimer less than 0.27  - [facility] cath 3/29 showed severe three-vessel obstructive CAD with normal LV function. Patient was recommended CABG, planned on Tuesday, 4/4.  - 2-D echo showed EF of 50-55%  Diabetes mellitus, uncontrolled - Hemoglobin A1c 8.9, patient absolutely does not want to go on insulin.  - place on sliding scale insulin, diabetic coordinator consult, nutrition. Patient admits that she does not follow carb modified diet as her children has been doing the grocery shopping. -Patient refuses sliding scale insulin, increase Lantus to 8 units at bedtime  Hypertension. Blood pressure controlled in the emergency department. Home medications include HCTZ, amlodipine -Per cardiology, stop amlodipine and patient placed on beta  blocker  Hyperlipidemia - Cholesterol 208, triglycerides 296, LDL 197, continue statin - Zocor changed to atorvastatin 80 mg   History of CVA : Recent on Plavix. No deficits   Tobacco use. - Patient counseled on tobacco cessation, she declined a nicotine patch   Depression - Patient's husband had passed last year, his birthday was in April, hence patient is feeling anxious. She however declines to be on any antianxiety or antidepressants.  Boils in the axilla likely due to staph infection, patient reports recurrent boils - Placed on doxycycline on 3/29, dc 4/4  Code Status: Full code   Family Communication: Discussed in detail with the patient, all imaging results, lab results explained to the patient   Disposition Plan: CABG planned next week  Time Spent in minutes   15 minutes  Procedures  Cardiac cath  Consults   Cardiology CTVS  DVT Prophylaxis  Lovenox  Medications  Scheduled Meds: . albuterol  2.5 mg Nebulization Once  . aspirin  324 mg Oral Once  . aspirin  81 mg Oral Daily  . atorvastatin  40 mg Oral q1800  . canagliflozin  300 mg Oral Daily  . doxycycline  100 mg Oral Q12H  . gi cocktail  30 mL Oral Once  .  hydrochlorothiazide  25 mg Oral Daily  . insulin aspart  0-15 Units Subcutaneous TID WC  . insulin aspart  0-5 Units Subcutaneous QHS  . insulin aspart  4 Units Subcutaneous TID WC  . insulin glargine  5 Units Subcutaneous QHS  . irbesartan  150 mg Oral Daily  . metoprolol tartrate  25 mg Oral BID  . mometasone-formoterol  2 puff Inhalation BID  . multivitamins with iron  1 tablet Oral Daily  . nitroGLYCERIN  0.5 inch Topical 3 times per day  . sodium chloride flush  3 mL Intravenous Q12H   Continuous Infusions: . heparin 1,400 Units/hr (06/17/15 1133)   PRN Meds:.sodium chloride, acetaminophen, gi cocktail, ipratropium-albuterol, morphine injection, nitroGLYCERIN, ondansetron (ZOFRAN) IV, sodium chloride flush   Antibiotics    Anti-infectives    Start     Dose/Rate Route Frequency Ordered Stop   06/14/15 0930  fluconazole (DIFLUCAN) tablet 150 mg     150 mg Oral  Once 06/14/15 0855 06/14/15 0950   06/13/15 1000  doxycycline (VIBRA-TABS) tablet 100 mg     100 mg Oral Every 12 hours 06/13/15 0831          Subjective:   [name] [name] was seen and examined today. No acute issues, no complaints. Denies dizziness,  abdominal pain, N/V/D/C, new weakness, numbess, tingling. No acute events overnight.    Objective:   Filed Vitals:   06/16/15 2100 06/17/15 0511 06/17/15 0912 06/17/15 1019  BP: 125/73 109/59  117/77  Pulse: 70 72  83  Temp: 98.2 F (36.8 C) 98.1 F (36.7 C)    TempSrc:      Resp: 21 20    Height:      Weight:  95.709 kg (211 lb)    SpO2: 99% 99% 97%     Intake/Output Summary (Last 24 hours) at 06/17/15 1233 Last data filed at 06/17/15 1133  Gross per 24 hour  Intake  827.7 ml  Output    700 ml  Net  127.7 ml     Wt Readings from Last 3 Encounters:  06/17/15 95.709 kg (211 lb)  11/17/12 99.474 kg (219 lb 4.8 oz)  10/05/12 100.245 kg (221 lb)     Exam  General: Alert and oriented x 3, NAD  HEENT:    Neck:   CVS: S1 S2 clear, RRR   Respiratory: CTAB  Abdomen: Soft, NT, ND, NBS  Ext: no cyanosis clubbing or edema  Neuro: no new deficits  Skin: No rashes  Psych: Normal affect and demeanor, alert and oriented x3    Data Reviewed:  I have personally reviewed following labs and imaging studies  Micro Results Recent Results (from the past 240 hour(s))  Surgical pcr screen     Status: None   Collection Time: 06/13/15 10:37 PM  Result Value Ref Range Status   MRSA, PCR NEGATIVE NEGATIVE Final   Staphylococcus aureus NEGATIVE NEGATIVE Final    Comment:        The Xpert SA Assay (FDA approved for NASAL specimens in patients over 60 years of age), is one component of a comprehensive surveillance program.  Test performance has been validated by [facility]  for patients greater than or equal to 57 year old. It is not intended to diagnose infection nor to guide or monitor treatment.     Radiology Reports Dg Chest 2 View  06/12/2015  CLINICAL DATA:  Sharp mid chest pain radiating into the right arm EXAM: CHEST  2 VIEW COMPARISON:  04/11/2012  FINDINGS: There is mild right middle lobe atelectasis. There is no focal parenchymal opacity. There is no pleural effusion or pneumothorax. The heart and mediastinal contours are unremarkable. The osseous structures are unremarkable. IMPRESSION: No active cardiopulmonary disease. Electronically Signed   By: Kathreen Devoid   On: 06/12/2015 13:00   Dg Cervical Spine With Flex & Extend  06/12/2015  CLINICAL DATA:  Neck pain and bilateral hand numbness and tingling for 2 weeks with no known injury EXAM: CERVICAL SPINE COMPLETE WITH FLEXION AND EXTENSION VIEWS COMPARISON:  None. FINDINGS: Normal alignment with no prevertebral soft tissue swelling. No fracture. There is degenerative change at the atlantodental articulation. Moderate C4-5, C5-6, and C6-7 degenerative disc disease with mild degenerative disc disease at C7-T1. Prominent anterior osteophytes are noted at C4-5, C5-6, and C6-7. There is mild C5-6 and C6-7 facet arthropathy. There is no significant foraminal narrowing. IMPRESSION: Degenerative changes with no acute findings Electronically Signed   By: Skipper Cliche M.D.   On: 06/12/2015 15:08    CBC  Recent Labs Lab 06/12/15 1231 06/15/15 0453 06/16/15 0420 06/17/15 0559  WBC 9.6 10.4 11.0* 11.5*  HGB 16.2* 14.9 15.7* 14.5  HCT 48.9* 45.6 46.8* 44.9  PLT 320 316 257 309  MCV 89.7 89.2 88.6 88.6  MCH 29.7 29.2 29.7 28.6  MCHC 33.1 32.7 33.5 32.3  RDW 15.2 15.4 15.2 15.2    Chemistries   Recent Labs Lab 06/12/15 1231 06/14/15 0325 06/14/15 0740 06/15/15 0453 06/16/15 0420 06/17/15 0559  NA 140  --  139 137 137 137  K 4.0  --  4.1 3.8 4.0 3.7  CL 103  --  105 102 103 99*  CO2 26  --  23  21* 20* 24  GLUCOSE 171*  --  191* 223* 158* 161*  BUN 11  --  15 16 19  23*  CREATININE 0.76  --  0.82 0.87 0.78 0.95  CALCIUM 10.1  --  9.5 9.9 10.0 10.0  AST 26 23  --   --   --   --   ALT 24 21  --   --   --   --   ALKPHOS 88 81  --   --   --   --   BILITOT 0.6 0.5  --   --   --   --    ------------------------------------------------------------------------------------------------------------------ estimated creatinine clearance is 78.5 mL/min (by C-G formula based on Cr of 0.95). ------------------------------------------------------------------------------------------------------------------ No results for input(s): HGBA1C in the last 72 hours. ------------------------------------------------------------------------------------------------------------------ No results for input(s): CHOL, HDL, LDLCALC, TRIG, CHOLHDL, LDLDIRECT in the last 72 hours. ------------------------------------------------------------------------------------------------------------------ No results for input(s): TSH, T4TOTAL, T3FREE, THYROIDAB in the last 72 hours.  Invalid input(s): FREET3 ------------------------------------------------------------------------------------------------------------------ No results for input(s): VITAMINB12, FOLATE, FERRITIN, TIBC, IRON, RETICCTPCT in the last 72 hours.  Coagulation profile  Recent Labs Lab 06/13/15 0904 06/14/15 0325  INR 1.14 1.11    No results for input(s): DDIMER in the last 72 hours.  Cardiac Enzymes  Recent Labs Lab 06/12/15 1932 06/13/15 0400  TROPONINI 0.79* 0.75*   ------------------------------------------------------------------------------------------------------------------ Invalid input(s): POCBNP   Recent Labs  06/16/15 0725 06/16/15 1141 06/16/15 1623 06/16/15 2143 06/17/15 0751 06/17/15 1150  GLUCAP 205* 217* 164* 192* 184* 159*     RAI,RIPUDEEP M.D. [facility] 06/17/2015, 12:33 PM  Pager: [phone] Between  7am to 7pm - call Pager - [phone]  After 7pm go to [email or web address] - password [email or web address]  Call night coverage person covering after 7pm

## 2015-06-17 NOTE — Progress Notes (Signed)
ANTICOAGULATION CONSULT NOTE - Follow Up Consult  Pharmacy Consult for Heparin  Indication: Awaiting CABG  No Known Allergies  Patient Measurements: Height: 5\' 7"  (170.2 cm) Weight: 211 lb (95.709 kg) IBW/kg (Calculated) : 61.6  Vital Signs: Temp: 98.1 F (36.7 C) (04/02 0511) BP: 109/59 mmHg (04/02 0511) Pulse Rate: 72 (04/02 0511)  Labs:  Recent Labs  06/15/15 0453  06/15/15 2346 06/16/15 0420 06/16/15 0428 06/17/15 0559  HGB 14.9  --   --  15.7*  --  14.5  HCT 45.6  --   --  46.8*  --  44.9  PLT 316  --   --  257  --  309  HEPARINUNFRC  --   < > 0.46  --  0.43 0.53  CREATININE 0.87  --   --  0.78  --  0.95  < > = values in this interval not displayed.  Estimated Creatinine Clearance: 78.5 mL/min (by C-G formula based on Cr of 0.95).  Assessment: 55 yo woman admitted 06/12/2015 for NSTEMI found to have severe 3-vessel disease. Pharmacy consulted to dose heparin awaiting CABG after Plavix washout.  PMH obesity, diabetes, hypertension, previous stroke, hepatitis C, rheumatoid arthritis   HL 0.53, continues to be therapeutic on heparin 1400 units/h. CBC stable, wnl. No noted bleeding. 4/01 P2Y12 191.   Goal of Therapy:  Heparin level 0.3-0.7 units/ml Monitor platelets by anticoagulation protocol: Yes   Plan:  -Continue heparin 1400 units/hr -Daily HL, CBC -Monitor s/sx bleeding -Planned CABG Tuesday   Heloise Ochoa, [place].D., BCPS PGY2 Cardiology Pharmacy Resident Pager: [phone] 06/17/2015,9:04 AM

## 2015-06-17 NOTE — Progress Notes (Signed)
Patient Name: [name] [name] Date of Encounter: 06/17/2015   SUBJECTIVE  No CP or dyspnea  CURRENT MEDS . albuterol  2.5 mg Nebulization Once  . aspirin  324 mg Oral Once  . aspirin  81 mg Oral Daily  . atorvastatin  40 mg Oral q1800  . canagliflozin  300 mg Oral Daily  . doxycycline  100 mg Oral Q12H  . gi cocktail  30 mL Oral Once  . hydrochlorothiazide  25 mg Oral Daily  . insulin aspart  0-15 Units Subcutaneous TID WC  . insulin aspart  0-5 Units Subcutaneous QHS  . insulin aspart  4 Units Subcutaneous TID WC  . insulin glargine  5 Units Subcutaneous QHS  . irbesartan  150 mg Oral Daily  . metoprolol tartrate  25 mg Oral BID  . mometasone-formoterol  2 puff Inhalation BID  . multivitamins with iron  1 tablet Oral Daily  . nitroGLYCERIN  0.5 inch Topical 3 times per day  . sodium chloride flush  3 mL Intravenous Q12H    OBJECTIVE  Filed Vitals:   06/16/15 1103 06/16/15 1351 06/16/15 2100 06/17/15 0511  BP: 114/64 100/65 125/73 109/59  Pulse:  81 70 72  Temp:  98.7 F (37.1 C) 98.2 F (36.8 C) 98.1 F (36.7 C)  TempSrc:  Oral    Resp:   21 20  Height:      Weight:    211 lb (95.709 kg)  SpO2:  97% 99% 99%    Intake/Output Summary (Last 24 hours) at 06/17/15 0815 Last data filed at 06/17/15 0514  Gross per 24 hour  Intake    680 ml  Output    700 ml  Net    -20 ml   Filed Weights   06/15/15 0505 06/16/15 0540 06/17/15 0511  Weight: 213 lb 3.2 oz (96.707 kg) 211 lb 9.6 oz (95.981 kg) 211 lb (95.709 kg)    PHYSICAL EXAM  General: Pleasant, NAD. Neuro: Alert and oriented X 3. Moves all extremities spontaneously. HEENT:  Normal  Neck: Supple  Lungs:  CTA  Heart: RRR  Abdomen: Soft, non-tender, non-distended Extremities: No edema. R radial cath site without hematoma.   Accessory Clinical Findings  CBC  Recent Labs  06/16/15 0420 06/17/15 0559  WBC 11.0* 11.5*  HGB 15.7* 14.5  HCT 46.8* 44.9  MCV 88.6 88.6  PLT 257 [id number]   Basic Metabolic  Panel  Recent Labs  06/15/15 0453 06/16/15 0420  NA 137 137  K 3.8 4.0  CL 102 103  CO2 21* 20*  GLUCOSE 223* 158*  BUN 16 19  CREATININE 0.87 0.78  CALCIUM 9.9 10.0   Liver Function Tests No results for input(s): AST, ALT, ALKPHOS, BILITOT, PROT, ALBUMIN in the last 72 hours.  TELE  Sinus rhythm  Radiology/Studies  Dg Chest 2 View  06/12/2015  CLINICAL DATA:  Sharp mid chest pain radiating into the right arm EXAM: CHEST  2 VIEW COMPARISON:  04/11/2012 FINDINGS: There is mild right middle lobe atelectasis. There is no focal parenchymal opacity. There is no pleural effusion or pneumothorax. The heart and mediastinal contours are unremarkable. The osseous structures are unremarkable. IMPRESSION: No active cardiopulmonary disease. Electronically Signed   By: Kathreen Devoid   On: 06/12/2015 13:00   Dg Cervical Spine With Flex & Extend  06/12/2015  CLINICAL DATA:  Neck pain and bilateral hand numbness and tingling for 2 weeks with no known injury EXAM: CERVICAL SPINE COMPLETE WITH FLEXION AND EXTENSION  VIEWS COMPARISON:  None. FINDINGS: Normal alignment with no prevertebral soft tissue swelling. No fracture. There is degenerative change at the atlantodental articulation. Moderate C4-5, C5-6, and C6-7 degenerative disc disease with mild degenerative disc disease at C7-T1. Prominent anterior osteophytes are noted at C4-5, C5-6, and C6-7. There is mild C5-6 and C6-7 facet arthropathy. There is no significant foraminal narrowing. IMPRESSION: Degenerative changes with no acute findings Electronically Signed   By: Skipper Cliche M.D.   On: 06/12/2015 15:08   Cath 06/13/15 Left Heart Cath and Coronary Angiography    Conclusion     Ost RCA to Mid RCA lesion, 80% stenosed.  Prox LAD lesion, 85% stenosed.  Lat 1st Mrg-1 lesion, 90% stenosed.  Lat 1st Mrg-2 lesion, 85% stenosed.  The left ventricular systolic function is normal.  1. Severe 3 vessel obstructive CAD 2. Normal LV  function  Plan: I reviewed study with Dr. Burt Knack. She has complex 3 vessel disease. Percutaneous intervention would be difficult given marked tortuosity of the LCx/OM and diffuse disease in the RCA. With history of DM she may be more appropriately treated with CABG. Will consult surgery. If she is felt not to be a surgical candidate then complex PCI would be an option. For future cath procedures I would recommend a femoral approach.    ASSESSMENT AND PLAN  1. NSTEMI (non-ST elevated myocardial infarction) (HCC) - Troponin of 0.79-->0.75. Cath showed evere three-vessel coronary artery disease with tortuous coronary vessels from her history of hypertension. Seen by surgery plan for CABG 06/19/15. 2. Hyperlipidemia-continue statin. 3. Hypertension-continue present medications.  Kirk Ruths, MD

## 2015-06-17 NOTE — Plan of Care (Signed)
Problem: Phase I Progression Outcomes Goal: Anginal pain relieved Outcome: Progressing Pt ambulated in hall 500 ft, minimal assist, no assistive devices. No complaints of chest pain at this time. Awaiting son for further education for CABG planned for Tuesday. Pt has no further questions at this time. VSS. Will continue to monitor.

## 2015-06-18 ENCOUNTER — Ambulatory Visit: Payer: Medicare Other

## 2015-06-18 DIAGNOSIS — I2510 Atherosclerotic heart disease of native coronary artery without angina pectoris: Secondary | ICD-10-CM

## 2015-06-18 LAB — ABO/RH: ABO/RH(D): B POS

## 2015-06-18 LAB — CBC
HCT: 46.3 % — ABNORMAL HIGH (ref 36.0–46.0)
Hemoglobin: 15.1 g/dL — ABNORMAL HIGH (ref 12.0–15.0)
MCH: 28.8 pg (ref 26.0–34.0)
MCHC: 32.6 g/dL (ref 30.0–36.0)
MCV: 88.4 fL (ref 78.0–100.0)
Platelets: 316 10*3/uL (ref 150–400)
RBC: 5.24 MIL/uL — ABNORMAL HIGH (ref 3.87–5.11)
RDW: 15.2 % (ref 11.5–15.5)
WBC: 11.5 10*3/uL — ABNORMAL HIGH (ref 4.0–10.5)

## 2015-06-18 LAB — BLOOD GAS, ARTERIAL
Acid-base deficit: 0.4 mmol/L (ref 0.0–2.0)
Bicarbonate: 23.8 mEq/L (ref 20.0–24.0)
Drawn by: 362771
FIO2: 0.21
O2 Saturation: 95.3 %
Patient temperature: 98.6
TCO2: 25 mmol/L (ref 0–100)
pCO2 arterial: 39.8 mmHg (ref 35.0–45.0)
pH, Arterial: 7.394 (ref 7.350–7.450)
pO2, Arterial: 85.2 mmHg (ref 80.0–100.0)

## 2015-06-18 LAB — GLUCOSE, CAPILLARY
Glucose-Capillary: 248 mg/dL — ABNORMAL HIGH (ref 65–99)
Glucose-Capillary: 166 mg/dL — ABNORMAL HIGH (ref 65–99)
Glucose-Capillary: 159 mg/dL — ABNORMAL HIGH (ref 65–99)
Glucose-Capillary: 279 mg/dL — ABNORMAL HIGH (ref 65–99)

## 2015-06-18 LAB — HEPARIN LEVEL (UNFRACTIONATED): Heparin Unfractionated: 0.55 IU/mL (ref 0.30–0.70)

## 2015-06-18 LAB — PREPARE RBC (CROSSMATCH)

## 2015-06-18 MED ORDER — SODIUM CHLORIDE 0.9 % IV SOLN
INTRAVENOUS | Status: DC
  Filled 2015-06-18: qty 30

## 2015-06-18 MED ORDER — PLASMA-LYTE 148 IV SOLN
INTRAVENOUS | Status: AC
  Administered 2015-06-19: 500 mL
  Filled 2015-06-18: qty 2.5

## 2015-06-18 MED ORDER — BISACODYL 5 MG PO TBEC
5.0000 mg | DELAYED_RELEASE_TABLET | Freq: Once | ORAL | Status: AC
  Administered 2015-06-18: 5 mg via ORAL
  Filled 2015-06-18: qty 1

## 2015-06-18 MED ORDER — PHENYLEPHRINE HCL 10 MG/ML IJ SOLN
30.0000 ug/min | INTRAMUSCULAR | Status: DC
  Filled 2015-06-18: qty 2

## 2015-06-18 MED ORDER — POTASSIUM CHLORIDE 2 MEQ/ML IV SOLN
80.0000 meq | INTRAVENOUS | Status: DC
  Filled 2015-06-18: qty 40

## 2015-06-18 MED ORDER — TRANEXAMIC ACID (OHS) BOLUS VIA INFUSION
15.0000 mg/kg | INTRAVENOUS | Status: DC
Start: 2015-06-19 — End: 2015-06-19
  Filled 2015-06-18: qty 1428

## 2015-06-18 MED ORDER — DEXTROSE 5 % IV SOLN
1.5000 g | INTRAVENOUS | Status: DC
  Filled 2015-06-18: qty 1.5

## 2015-06-18 MED ORDER — TEMAZEPAM 15 MG PO CAPS: 15.0000 mg | ORAL_CAPSULE | Freq: Once | ORAL | Status: DC | PRN

## 2015-06-18 MED ORDER — SODIUM CHLORIDE 0.9 % IV SOLN
INTRAVENOUS | Status: DC
  Administered 2015-06-19: 70 mL/h via INTRAVENOUS
  Filled 2015-06-18: qty 40

## 2015-06-18 MED ORDER — EPINEPHRINE HCL 1 MG/ML IJ SOLN
0.0000 ug/min | INTRAVENOUS | Status: DC
  Filled 2015-06-18: qty 4

## 2015-06-18 MED ORDER — DOPAMINE-DEXTROSE 3.2-5 MG/ML-% IV SOLN
0.0000 ug/kg/min | INTRAVENOUS | Status: DC
  Filled 2015-06-18: qty 250

## 2015-06-18 MED ORDER — DEXMEDETOMIDINE HCL IN NACL 400 MCG/100ML IV SOLN
0.1000 ug/kg/h | INTRAVENOUS | Status: DC
  Administered 2015-06-19: .3 ug/kg/h via INTRAVENOUS
  Filled 2015-06-18: qty 100

## 2015-06-18 MED ORDER — METOPROLOL TARTRATE 12.5 MG HALF TABLET
12.5000 mg | ORAL_TABLET | Freq: Once | ORAL | Status: AC
  Administered 2015-06-19: 12.5 mg via ORAL
  Filled 2015-06-18: qty 1

## 2015-06-18 MED ORDER — ALPRAZOLAM 0.25 MG PO TABS: 0.2500 mg | ORAL_TABLET | ORAL | Status: DC | PRN

## 2015-06-18 MED ORDER — CHLORHEXIDINE GLUCONATE 4 % EX LIQD
60.0000 mL | Freq: Once | CUTANEOUS | Status: AC
  Administered 2015-06-19: 4 via TOPICAL
  Filled 2015-06-18: qty 60

## 2015-06-18 MED ORDER — NITROGLYCERIN IN D5W 200-5 MCG/ML-% IV SOLN
2.0000 ug/min | INTRAVENOUS | Status: AC
  Administered 2015-06-19: 10 ug/min via INTRAVENOUS
  Administered 2015-06-19: 25 ug/min via INTRAVENOUS
  Administered 2015-06-19: 10 ug/min via INTRAVENOUS
  Filled 2015-06-18: qty 250

## 2015-06-18 MED ORDER — SODIUM CHLORIDE 0.9 % IV SOLN
INTRAVENOUS | Status: AC
  Administered 2015-06-19: 2.6 [IU]/h via INTRAVENOUS
  Administered 2015-06-19: 1 [IU]/h via INTRAVENOUS
  Filled 2015-06-18: qty 2.5

## 2015-06-18 MED ORDER — DEXTROSE 5 % IV SOLN
750.0000 mg | INTRAVENOUS | Status: DC
  Filled 2015-06-18: qty 750

## 2015-06-18 MED ORDER — DIAZEPAM 5 MG PO TABS
5.0000 mg | ORAL_TABLET | Freq: Once | ORAL | Status: AC
  Administered 2015-06-19: 5 mg via ORAL
  Filled 2015-06-18: qty 1

## 2015-06-18 MED ORDER — CHLORHEXIDINE GLUCONATE 0.12 % MT SOLN
15.0000 mL | Freq: Once | OROMUCOSAL | Status: AC
  Administered 2015-06-19: 15 mL via OROMUCOSAL
  Filled 2015-06-18: qty 15

## 2015-06-18 MED ORDER — MAGNESIUM SULFATE 50 % IJ SOLN
40.0000 meq | INTRAMUSCULAR | Status: DC
  Filled 2015-06-18: qty 10

## 2015-06-18 MED ORDER — CHLORHEXIDINE GLUCONATE 4 % EX LIQD
60.0000 mL | Freq: Once | CUTANEOUS | Status: AC
  Administered 2015-06-18: 4 via TOPICAL
  Filled 2015-06-18: qty 60

## 2015-06-18 MED ORDER — VANCOMYCIN HCL 10 G IV SOLR
1500.0000 mg | INTRAVENOUS | Status: DC
  Filled 2015-06-18: qty 1500

## 2015-06-18 NOTE — Care Management Note (Addendum)
Case Management Note  Patient Details  Name: [name] [name] MRN: [id number] Date of Birth: [birth year]  Subjective/Objective:   Pt admitted for CAD/NSTEMI: Plan for CABG 06-19-15 (Plavix Washout). Pt has family support.  Pt stays with son and his family.                  Action/Plan: CM to continue to monitor post procedure for additional needs.    Expected Discharge Date:                  Expected Discharge Plan:  Home/Self Care  In-House Referral:  NA  Discharge planning Services  CM Consult  Post Acute Care Choice:    Choice offered to:     DME Arranged:    DME Agency:     HH Arranged:    HH Agency:     Status of Service:  In process, will continue to follow  Medicare Important Message Given:  Yes Date Medicare IM Given:    Medicare IM give by:    Date Additional Medicare IM Given:    Additional Medicare Important Message give by:     If discussed at [facility] of Stay Meetings, dates discussed:    Additional Comments:  Bethena Roys, RN 06/18/2015, 1:53 PM

## 2015-06-18 NOTE — Progress Notes (Signed)
5 Days Post-Op Procedure(s) (LRB): Left Heart Cath and Coronary Angiography (N/A) Subjective: Platelet function improving slowly with plavix washout Stable on iv heparin Plan CABG in AM  Objective: Vital signs in last 24 hours: Temp:  [98.2 F (36.8 C)-98.3 F (36.8 C)] 98.2 F (36.8 C) (04/03 0505) Pulse Rate:  [68-83] 68 (04/03 0505) Cardiac Rhythm:  [-] Normal sinus rhythm (04/02 2040) Resp:  [18-21] 21 (04/03 0505) BP: (117-131)/(68-77) 125/68 mmHg (04/03 0505) SpO2:  [97 %-99 %] 99 % (04/03 0505) Weight:  [209 lb 14.4 oz (95.21 kg)] 209 lb 14.4 oz (95.21 kg) (04/03 0505)  Hemodynamic parameters for last 24 hours:  nsr  Intake/Output from previous day: 04/02 0701 - 04/03 0700 In: 1593.4 [P.O.:1120; I.V.:473.4] Out: 2500 [Urine:2500] Intake/Output this shift:         Exam    General- alert and comfortable   Lungs- clear without rales, wheezes   Cor- regular rate and rhythm, no murmur , gallop   Abdomen- soft, non-tender   Extremities - warm, non-tender, minimal edema   Neuro- oriented, appropriate, no focal weakness   Lab Results:  Recent Labs  06/17/15 0559 06/18/15 0518  WBC 11.5* 11.5*  HGB 14.5 15.1*  HCT 44.9 46.3*  PLT 309 316   BMET:  Recent Labs  06/16/15 0420 06/17/15 0559  NA 137 137  K 4.0 3.7  CL 103 99*  CO2 20* 24  GLUCOSE 158* 161*  BUN 19 23*  CREATININE 0.78 0.95  CALCIUM 10.0 10.0    PT/INR: No results for input(s): LABPROT, INR in the last 72 hours. ABG    Component Value Date/Time   TCO2 26 09/24/2012 0058   CBG (last 3)   Recent Labs  06/17/15 1150 06/17/15 1630 06/17/15 2056  GLUCAP 159* 174* 206*    Assessment/Plan: S/P Procedure(s) (LRB): Left Heart Cath and Coronary Angiography (N/A) CABG in am Orders placed Procedure, benefits and risks discussed with patient   LOS: 3 days    Tharon Aquas Trigt III 06/18/2015

## 2015-06-18 NOTE — Progress Notes (Addendum)
SUBJECTIVE:  No CP. Ready for surgery.  OBJECTIVE:   Vitals:   Filed Vitals:   06/17/15 2008 06/17/15 2052 06/18/15 0505 06/18/15 0810  BP:  131/70 125/68   Pulse:  81 68 76  Temp:  98.3 F (36.8 C) 98.2 F (36.8 C)   TempSrc:  Oral    Resp:  18 21 20   Height:      Weight:   209 lb 14.4 oz (95.21 kg)   SpO2: 97% 99% 99% 96%   I&O's:   Intake/Output Summary (Last 24 hours) at 06/18/15 0852 Last data filed at 06/18/15 0700  Gross per 24 hour  Intake 1353.43 ml  Output   2500 ml  Net -1146.57 ml   TELEMETRY: Reviewed telemetry pt in NSR:     PHYSICAL EXAM General: Well developed, well nourished, in no acute distress Head:   Normal cephalic and atramatic  Lungs:   Clear bilaterally to auscultation. Heart:   HRRR S1 S2  No JVD.   Abdomen: abdomen soft and non-tender Msk:  Back normal,  Normal strength and tone for age. Extremities:  Tr ankle edema bilaterally.   Neuro: Alert and oriented. Psych:  Normal affect, responds appropriately Skin: No rash   LABS: Basic Metabolic Panel:  Recent Labs  06/16/15 0420 06/17/15 0559  NA 137 137  K 4.0 3.7  CL 103 99*  CO2 20* 24  GLUCOSE 158* 161*  BUN 19 23*  CREATININE 0.78 0.95  CALCIUM 10.0 10.0   Liver Function Tests: No results for input(s): AST, ALT, ALKPHOS, BILITOT, PROT, ALBUMIN in the last 72 hours. No results for input(s): LIPASE, AMYLASE in the last 72 hours. CBC:  Recent Labs  06/17/15 0559 06/18/15 0518  WBC 11.5* 11.5*  HGB 14.5 15.1*  HCT 44.9 46.3*  MCV 88.6 88.4  PLT 309 316   Cardiac Enzymes: No results for input(s): CKTOTAL, CKMB, CKMBINDEX, TROPONINI in the last 72 hours. BNP: Invalid input(s): POCBNP D-Dimer: No results for input(s): DDIMER in the last 72 hours. Hemoglobin A1C: No results for input(s): HGBA1C in the last 72 hours. Fasting Lipid Panel: No results for input(s): CHOL, HDL, LDLCALC, TRIG, CHOLHDL, LDLDIRECT in the last 72 hours. Thyroid Function Tests: No  results for input(s): TSH, T4TOTAL, T3FREE, THYROIDAB in the last 72 hours.  Invalid input(s): FREET3 Anemia Panel: No results for input(s): VITAMINB12, FOLATE, FERRITIN, TIBC, IRON, RETICCTPCT in the last 72 hours. Coag Panel:   Lab Results  Component Value Date   INR 1.11 06/14/2015   INR 1.14 06/13/2015   INR 0.99 09/24/2012    RADIOLOGY: Dg Chest 2 View  06/12/2015  CLINICAL DATA:  Sharp mid chest pain radiating into the right arm EXAM: CHEST  2 VIEW COMPARISON:  04/11/2012 FINDINGS: There is mild right middle lobe atelectasis. There is no focal parenchymal opacity. There is no pleural effusion or pneumothorax. The heart and mediastinal contours are unremarkable. The osseous structures are unremarkable. IMPRESSION: No active cardiopulmonary disease. Electronically Signed   By: Kathreen Devoid   On: 06/12/2015 13:00   Dg Cervical Spine With Flex & Extend  06/12/2015  CLINICAL DATA:  Neck pain and bilateral hand numbness and tingling for 2 weeks with no known injury EXAM: CERVICAL SPINE COMPLETE WITH FLEXION AND EXTENSION VIEWS COMPARISON:  None. FINDINGS: Normal alignment with no prevertebral soft tissue swelling. No fracture. There is degenerative change at the atlantodental articulation. Moderate C4-5, C5-6, and C6-7 degenerative disc disease with mild degenerative disc disease at C7-T1. Prominent  anterior osteophytes are noted at C4-5, C5-6, and C6-7. There is mild C5-6 and C6-7 facet arthropathy. There is no significant foraminal narrowing. IMPRESSION: Degenerative changes with no acute findings Electronically Signed   By: Skipper Cliche M.D.   On: 06/12/2015 15:08      ASSESSMENT: Kathyrn Lass:  1)   CAD/NSTEMI: Plan for CABG in AM tomorrow.  She is mentally ready to have this done.  No angina.  She wants to know which leg will be used for vein harvesting. No sx of CHF.  Hyperlipidemia: Continue statin given CAD.  LDL very high, 197 in 3/17.  BP stable.  Questions answered.   [name], MD  06/18/2015  8:52 AM

## 2015-06-18 NOTE — Progress Notes (Signed)
[facility]                                                                              Patient Demographics  [name] [name], is a 55 y.o. female, DOB - 1961/02/27, LE:[id number]  Admit date - 06/12/2015   Admitting Physician Waldemar Dickens, MD  Outpatient Primary MD for the patient is [name], [name], MD  LOS -   days    Chief Complaint  Patient presents with  . Chest Pain       Brief HPI   [name] [name] is a 55 y.o. female with a past medical history that includes obesity diabetes, hypertension, previous stroke, hepatitis C, rheumatoid arthritis presented with chest pain. Patient reported chronic general pain related to rheumatoid arthritis but stated aslo intermittent "different" pain over the last 3-4 days. She reports right anterior chest pain radiating to bilateral jaw associated with a feeling of fullness in her throat worse with cough. In addition she reports bilateral hand numbness and tingling.   Assessment & Plan    Principal Problem:   NSTEMI (non-ST elevated myocardial infarction) ([place])-  Risk factors include obesity, diabetes, hypertension, hyperlipidemia, smoking -  Troponins positive, d-dimer less than 0.27  - Hardy cath 3/29 showed severe three-vessel obstructive CAD with normal LV function. Patient was recommended CABG, planned on Tuesday, 4/4.  - 2-D echo showed EF of 50-55%  Diabetes mellitus, uncontrolled - Hemoglobin A1c 8.9, patient absolutely does not want to go on insulin.  - place on sliding scale insulin, diabetic coordinator consult, nutrition. Patient admits that she does not follow carb modified diet as her children has been doing the grocery shopping. -Patient refuses sliding scale insulin, increase Lantus to 8 units at bedtime  Hypertension. Blood pressure controlled in the emergency department. Home medications include HCTZ, amlodipine -Per cardiology, stop amlodipine and patient placed on beta  blocker  Hyperlipidemia - Cholesterol 208, triglycerides 296, LDL 197, continue statin - Zocor changed to atorvastatin 80 mg   History of CVA : Recent on Plavix. No deficits   Tobacco use. - Patient counseled on tobacco cessation, she declined a nicotine patch   Depression, anxiety due to her late husband's birthday in April - Feels a lot better now. Patient has good family support.  Boils in the axilla likely due to staph infection, patient reports recurrent boils - Placed on doxycycline on 3/29, dc 4/4  Code Status: Full code   Family Communication: Discussed in detail with the patient, all imaging results, lab results explained to the patient   Disposition Plan: CABG planned tomorrow. No acute medical issues.   Time Spent in minutes   15 minutes  Procedures  Cardiac cath  Consults   Cardiology CTVS  DVT Prophylaxis  Lovenox  Medications  Scheduled Meds: . albuterol  2.5 mg Nebulization Once  . [START ON 06/19/2015] aminocaproic acid (AMICAR) for OHS   Intravenous To OR  . aspirin  324 mg Oral Once  . aspirin  81 mg Oral Daily  . atorvastatin  40 mg Oral q1800  . bisacodyl  5 mg Oral Once  . canagliflozin  300 mg Oral  Daily  . [START ON 06/19/2015] cefUROXime (ZINACEF)  IV  1.5 g Intravenous To OR  . [START ON 06/19/2015] cefUROXime (ZINACEF)  IV  750 mg Intravenous To OR  . chlorhexidine  60 mL Topical Once   And  . [START ON 06/19/2015] chlorhexidine  60 mL Topical Once  . [START ON 06/19/2015] chlorhexidine  15 mL Mouth/Throat Once  . [START ON 06/19/2015] dexmedetomidine  0.1-0.7 mcg/kg/hr Intravenous To OR  . [START ON 06/19/2015] diazepam  5 mg Oral Once  . [START ON 06/19/2015] DOPamine  0-10 mcg/kg/min Intravenous To OR  . doxycycline  100 mg Oral Q12H  . [START ON 06/19/2015] epinephrine  0-10 mcg/min Intravenous To OR  . gi cocktail  30 mL Oral Once  . [START ON 06/19/2015] heparin-papaverine-plasmalyte irrigation   Irrigation To OR  . [START ON 06/19/2015] heparin  30,000 units/NS 1000 mL solution for CELLSAVER   Other To OR  . hydrochlorothiazide  25 mg Oral Daily  . insulin aspart  0-15 Units Subcutaneous TID WC  . insulin aspart  0-5 Units Subcutaneous QHS  . insulin aspart  4 Units Subcutaneous TID WC  . insulin glargine  8 Units Subcutaneous QHS  . [START ON 06/19/2015] insulin (NOVOLIN-R) infusion   Intravenous To OR  . irbesartan  150 mg Oral Daily  . [START ON 06/19/2015] magnesium sulfate  40 mEq Other To OR  . [START ON 06/19/2015] metoprolol tartrate  12.5 mg Oral Once  . metoprolol tartrate  25 mg Oral BID  . mometasone-formoterol  2 puff Inhalation BID  . multivitamins with iron  1 tablet Oral Daily  . nitroGLYCERIN  0.5 inch Topical 3 times per day  . [START ON 06/19/2015] nitroGLYCERIN  2-200 mcg/min Intravenous To OR  . [START ON 06/19/2015] phenylephrine (NEO-SYNEPHRINE) Adult infusion  30-200 mcg/min Intravenous To OR  . [START ON 06/19/2015] potassium chloride  80 mEq Other To OR  . sodium chloride flush  3 mL Intravenous Q12H  . [START ON 06/19/2015] tranexamic acid  15 mg/kg Intravenous To OR  . [START ON 06/19/2015] vancomycin  1,500 mg Intravenous To OR   Continuous Infusions: . heparin 1,400 Units/hr (06/18/15 0537)   PRN Meds:.sodium chloride, acetaminophen, ALPRAZolam, gi cocktail, ipratropium-albuterol, morphine injection, nitroGLYCERIN, ondansetron (ZOFRAN) IV, sodium chloride flush, temazepam   Antibiotics   Anti-infectives    Start     Dose/Rate Route Frequency Ordered Stop   06/19/15 0400  vancomycin (VANCOCIN) 1,500 mg in sodium chloride 0.9 % 250 mL IVPB     1,500 mg 125 mL/hr over 120 Minutes Intravenous To Surgery 06/18/15 1023 06/20/15 0400   06/19/15 0400  cefUROXime (ZINACEF) 1.5 g in dextrose 5 % 50 mL IVPB     1.5 g 100 mL/hr over 30 Minutes Intravenous To Surgery 06/18/15 1023 06/20/15 0400   06/19/15 0400  cefUROXime (ZINACEF) 750 mg in dextrose 5 % 50 mL IVPB     750 mg 100 mL/hr over 30 Minutes Intravenous To  Surgery 06/18/15 0856 06/20/15 0400   06/14/15 0930  fluconazole (DIFLUCAN) tablet 150 mg     150 mg Oral  Once 06/14/15 0855 06/14/15 0950   06/13/15 1000  doxycycline (VIBRA-TABS) tablet 100 mg     100 mg Oral Every 12 hours 06/13/15 0831          Subjective:   [name] [name] was seen and examined today. No complaints, asking about vein harvesting. No acute issues, no complaints. Denies dizziness,  abdominal pain, N/V/D/C, new weakness,  numbess, tingling. No acute events overnight.    Objective:   Filed Vitals:   06/17/15 2008 06/17/15 2052 06/18/15 0505 06/18/15 0810  BP:  131/70 125/68   Pulse:  81 68 76  Temp:  98.3 F (36.8 C) 98.2 F (36.8 C)   TempSrc:  Oral    Resp:  18 21 20   Height:      Weight:   95.21 kg (209 lb 14.4 oz)   SpO2: 97% 99% 99% 96%    Intake/Output Summary (Last 24 hours) at 06/18/15 1351 Last data filed at 06/18/15 0700  Gross per 24 hour  Intake 909.73 ml  Output   1700 ml  Net -790.27 ml     Wt Readings from Last 3 Encounters:  06/18/15 95.21 kg (209 lb 14.4 oz)  11/17/12 99.474 kg (219 lb 4.8 oz)  10/05/12 100.245 kg (221 lb)     Exam  General: Alert and oriented x 3, NAD  HEENT:    Neck:   CVS: S1 S2 clear, RRR   Respiratory: CTAB  Abdomen: Soft, NT, ND, NBS  Ext: no cyanosis clubbing or edema  Neuro: no new deficits  Skin: No rashes  Psych: Normal affect and demeanor, alert and oriented x3    Data Reviewed:  I have personally reviewed following labs and imaging studies  Micro Results Recent Results (from the past 240 hour(s))  Surgical pcr screen     Status: None   Collection Time: 06/13/15 10:37 PM  Result Value Ref Range Status   MRSA, PCR NEGATIVE NEGATIVE Final   Staphylococcus aureus NEGATIVE NEGATIVE Final    Comment:        The Xpert SA Assay (FDA approved for NASAL specimens in patients over 13 years of age), is one component of a comprehensive surveillance program.  Test performance has been  validated by [facility] for patients greater than or equal to 11 year old. It is not intended to diagnose infection nor to guide or monitor treatment.     Radiology Reports Dg Chest 2 View  06/12/2015  CLINICAL DATA:  Sharp mid chest pain radiating into the right arm EXAM: CHEST  2 VIEW COMPARISON:  04/11/2012 FINDINGS: There is mild right middle lobe atelectasis. There is no focal parenchymal opacity. There is no pleural effusion or pneumothorax. The heart and mediastinal contours are unremarkable. The osseous structures are unremarkable. IMPRESSION: No active cardiopulmonary disease. Electronically Signed   By: Kathreen Devoid   On: 06/12/2015 13:00   Dg Cervical Spine With Flex & Extend  06/12/2015  CLINICAL DATA:  Neck pain and bilateral hand numbness and tingling for 2 weeks with no known injury EXAM: CERVICAL SPINE COMPLETE WITH FLEXION AND EXTENSION VIEWS COMPARISON:  None. FINDINGS: Normal alignment with no prevertebral soft tissue swelling. No fracture. There is degenerative change at the atlantodental articulation. Moderate C4-5, C5-6, and C6-7 degenerative disc disease with mild degenerative disc disease at C7-T1. Prominent anterior osteophytes are noted at C4-5, C5-6, and C6-7. There is mild C5-6 and C6-7 facet arthropathy. There is no significant foraminal narrowing. IMPRESSION: Degenerative changes with no acute findings Electronically Signed   By: Skipper Cliche M.D.   On: 06/12/2015 15:08    CBC  Recent Labs Lab 06/12/15 1231 06/15/15 0453 06/16/15 0420 06/17/15 0559 06/18/15 0518  WBC 9.6 10.4 11.0* 11.5* 11.5*  HGB 16.2* 14.9 15.7* 14.5 15.1*  HCT 48.9* 45.6 46.8* 44.9 46.3*  PLT 320 316 257 309 316  MCV 89.7 89.2 88.6 88.6  88.4  MCH 29.7 29.2 29.7 28.6 28.8  MCHC 33.1 32.7 33.5 32.3 32.6  RDW 15.2 15.4 15.2 15.2 15.2    Chemistries   Recent Labs Lab 06/12/15 1231 06/14/15 0325 06/14/15 0740 06/15/15 0453 06/16/15 0420 06/17/15 0559  NA 140  --  139 137  137 137  K 4.0  --  4.1 3.8 4.0 3.7  CL 103  --  105 102 103 99*  CO2 26  --  23 21* 20* 24  GLUCOSE 171*  --  191* 223* 158* 161*  BUN 11  --  15 16 19  23*  CREATININE 0.76  --  0.82 0.87 0.78 0.95  CALCIUM 10.1  --  9.5 9.9 10.0 10.0  AST 26 23  --   --   --   --   ALT 24 21  --   --   --   --   ALKPHOS 88 81  --   --   --   --   BILITOT 0.6 0.5  --   --   --   --    ------------------------------------------------------------------------------------------------------------------ estimated creatinine clearance is 78.3 mL/min (by C-G formula based on Cr of 0.95). ------------------------------------------------------------------------------------------------------------------ No results for input(s): HGBA1C in the last 72 hours. ------------------------------------------------------------------------------------------------------------------ No results for input(s): CHOL, HDL, LDLCALC, TRIG, CHOLHDL, LDLDIRECT in the last 72 hours. ------------------------------------------------------------------------------------------------------------------ No results for input(s): TSH, T4TOTAL, T3FREE, THYROIDAB in the last 72 hours.  Invalid input(s): FREET3 ------------------------------------------------------------------------------------------------------------------ No results for input(s): VITAMINB12, FOLATE, FERRITIN, TIBC, IRON, RETICCTPCT in the last 72 hours.  Coagulation profile  Recent Labs Lab 06/13/15 0904 06/14/15 0325  INR 1.14 1.11    No results for input(s): DDIMER in the last 72 hours.  Cardiac Enzymes  Recent Labs Lab 06/12/15 1932 06/13/15 0400  TROPONINI 0.79* 0.75*   ------------------------------------------------------------------------------------------------------------------ Invalid input(s): POCBNP   Recent Labs  06/17/15 0751 06/17/15 1150 06/17/15 1630 06/17/15 2056 06/18/15 0845 06/18/15 1148  GLUCAP 184* 159* 174* 61* 248* 166*      Austine Kelsay M.D. [facility] 06/18/2015, 1:51 PM  Pager: [phone] Between 7am to 7pm - call Pager - [phone]  After 7pm go to [email or web address] - password [email or web address]  Call night coverage person covering after 7pm

## 2015-06-18 NOTE — Progress Notes (Signed)
CARDIAC REHAB PHASE I   PRE:  Rate/Rhythm: 76 SR  BP:  Sitting: 131/84        SaO2: 98 RA  MODE:  Ambulation: 700 ft   POST:  Rate/Rhythm: 77 SR  BP:  Sitting: 132/77         SaO2: 100 RA  Pt eager to walk, talkative. Pt ambulated 700 ft on RA, IV, independent, steady gait tolerated well, no complaints. Answered pt's questions regarding post-op activity progression. Pt to bed per pt request after walk, call bell within reach. Will follow post-op.   [id number]  Lenna Sciara, RN, BSN 06/18/2015 11:50 AM

## 2015-06-18 NOTE — Care Management Important Message (Signed)
Important Message  Patient Details  Name: [name] [name] MRN: [id number] Date of Birth: 11/16/1960   Medicare Important Message Given:  Yes    Nathen May 06/18/2015, 11:34 AM

## 2015-06-18 NOTE — Progress Notes (Signed)
ANTICOAGULATION CONSULT NOTE - Follow Up Consult  Pharmacy Consult for Heparin  Indication: Awaiting CABG  No Known Allergies  Patient Measurements: Height: 5\' 7"  (170.2 cm) Weight: 209 lb 14.4 oz (95.21 kg) IBW/kg (Calculated) : 61.6  Vital Signs: Temp: 98.2 F (36.8 C) (04/03 0505) BP: 125/68 mmHg (04/03 0505) Pulse Rate: 76 (04/03 0810)  Labs:  Recent Labs  06/16/15 0420 06/16/15 0428 06/17/15 0559 06/18/15 0518  HGB 15.7*  --  14.5 15.1*  HCT 46.8*  --  44.9 46.3*  PLT 257  --  309 316  HEPARINUNFRC  --  0.43 0.53 0.55  CREATININE 0.78  --  0.95  --     Estimated Creatinine Clearance: 78.3 mL/min (by C-G formula based on Cr of 0.95).  Assessment: 55 yo woman admitted 06/12/2015 for NSTEMI found to have severe 3-vessel disease. Pharmacy consulted to dose heparin awaiting CABG after Plavix washout.  PMH obesity, diabetes, hypertension, previous stroke, hepatitis C, rheumatoid arthritis   Heparin level therapeutic at 0.53>0.55 on heparin 1400 units/h. CBC stable, wnl. No noted bleeding. 4/01 P2Y12 191.   Goal of Therapy:  Heparin level 0.3-0.7 units/ml Monitor platelets by anticoagulation protocol: Yes   Plan:  -Continue heparin 1400 units/hr -Daily HL, CBC -Monitor s/sx bleeding -Planned CABG Tuesday   Thank you for allowing [place] to participate in this patients care. Jens Som, PharmD Pager: [phone]  06/18/2015,10:07 AM

## 2015-06-19 ENCOUNTER — Encounter (HOSPITAL_COMMUNITY): Payer: Self-pay | Admitting: Anesthesiology

## 2015-06-19 ENCOUNTER — Inpatient Hospital Stay (HOSPITAL_COMMUNITY): Payer: Commercial Managed Care - HMO

## 2015-06-19 ENCOUNTER — Inpatient Hospital Stay (HOSPITAL_COMMUNITY): Payer: Commercial Managed Care - HMO | Admitting: Anesthesiology

## 2015-06-19 ENCOUNTER — Encounter (HOSPITAL_COMMUNITY)
Admission: EM | Disposition: A | Discharge status: Home / Self Care | Payer: Self-pay | Source: Home / Self Care | Attending: Internal Medicine

## 2015-06-19 HISTORY — PX: CORONARY ARTERY BYPASS GRAFT: SHX141

## 2015-06-19 HISTORY — PX: TEE WITHOUT CARDIOVERSION: SHX5443

## 2015-06-19 LAB — POCT I-STAT 3, ART BLOOD GAS (G3+)
Acid-Base Excess: 1 mmol/L (ref 0.0–2.0)
Acid-Base Excess: 1 mmol/L (ref 0.0–2.0)
Acid-Base Excess: 1 mmol/L (ref 0.0–2.0)
Acid-base deficit: 3 mmol/L — ABNORMAL HIGH (ref 0.0–2.0)
Bicarbonate: 25.3 mEq/L — ABNORMAL HIGH (ref 20.0–24.0)
Bicarbonate: 26.4 mEq/L — ABNORMAL HIGH (ref 20.0–24.0)
Bicarbonate: 24.8 mEq/L — ABNORMAL HIGH (ref 20.0–24.0)
Bicarbonate: 23.6 mEq/L (ref 20.0–24.0)
Bicarbonate: 26.3 mEq/L — ABNORMAL HIGH (ref 20.0–24.0)
Bicarbonate: 26.3 mEq/L — ABNORMAL HIGH (ref 20.0–24.0)
O2 Saturation: 100 %
O2 Saturation: 100 %
O2 Saturation: 99 %
O2 Saturation: 89 %
O2 Saturation: 95 %
O2 Saturation: 95 %
Patient temperature: 36.3
Patient temperature: 36.8
Patient temperature: 37.5
TCO2: 27 mmol/L (ref 0–100)
TCO2: 28 mmol/L (ref 0–100)
TCO2: 26 mmol/L (ref 0–100)
TCO2: 25 mmol/L (ref 0–100)
TCO2: 28 mmol/L (ref 0–100)
TCO2: 28 mmol/L (ref 0–100)
pCO2 arterial: 41.8 mmHg (ref 35.0–45.0)
pCO2 arterial: 42.5 mmHg (ref 35.0–45.0)
pCO2 arterial: 39.2 mmHg (ref 35.0–45.0)
pCO2 arterial: 47.5 mmHg — ABNORMAL HIGH (ref 35.0–45.0)
pCO2 arterial: 44.7 mmHg (ref 35.0–45.0)
pCO2 arterial: 45.9 mmHg — ABNORMAL HIGH (ref 35.0–45.0)
pH, Arterial: 7.39 (ref 7.350–7.450)
pH, Arterial: 7.401 (ref 7.350–7.450)
pH, Arterial: 7.41 (ref 7.350–7.450)
pH, Arterial: 7.301 — ABNORMAL LOW (ref 7.350–7.450)
pH, Arterial: 7.377 (ref 7.350–7.450)
pH, Arterial: 7.367 (ref 7.350–7.450)
pO2, Arterial: 406 mmHg — ABNORMAL HIGH (ref 80.0–100.0)
pO2, Arterial: 484 mmHg — ABNORMAL HIGH (ref 80.0–100.0)
pO2, Arterial: 147 mmHg — ABNORMAL HIGH (ref 80.0–100.0)
pO2, Arterial: 60 mmHg — ABNORMAL LOW (ref 80.0–100.0)
pO2, Arterial: 76 mmHg — ABNORMAL LOW (ref 80.0–100.0)
pO2, Arterial: 79 mmHg — ABNORMAL LOW (ref 80.0–100.0)

## 2015-06-19 LAB — POCT I-STAT, CHEM 8
BUN: 24 mg/dL — ABNORMAL HIGH (ref 6–20)
BUN: 19 mg/dL (ref 6–20)
BUN: 22 mg/dL — ABNORMAL HIGH (ref 6–20)
BUN: 17 mg/dL (ref 6–20)
BUN: 20 mg/dL (ref 6–20)
BUN: 17 mg/dL (ref 6–20)
BUN: 17 mg/dL (ref 6–20)
BUN: 13 mg/dL (ref 6–20)
Calcium, Ion: 1.31 mmol/L — ABNORMAL HIGH (ref 1.12–1.23)
Calcium, Ion: 1.04 mmol/L — ABNORMAL LOW (ref 1.12–1.23)
Calcium, Ion: 1.25 mmol/L — ABNORMAL HIGH (ref 1.12–1.23)
Calcium, Ion: 0.99 mmol/L — ABNORMAL LOW (ref 1.12–1.23)
Calcium, Ion: 1.09 mmol/L — ABNORMAL LOW (ref 1.12–1.23)
Calcium, Ion: 0.94 mmol/L — ABNORMAL LOW (ref 1.12–1.23)
Calcium, Ion: 1.21 mmol/L (ref 1.12–1.23)
Calcium, Ion: 1.18 mmol/L (ref 1.12–1.23)
Chloride: 102 mmol/L (ref 101–111)
Chloride: 103 mmol/L (ref 101–111)
Chloride: 96 mmol/L — ABNORMAL LOW (ref 101–111)
Chloride: 100 mmol/L — ABNORMAL LOW (ref 101–111)
Chloride: 97 mmol/L — ABNORMAL LOW (ref 101–111)
Chloride: 99 mmol/L — ABNORMAL LOW (ref 101–111)
Chloride: 103 mmol/L (ref 101–111)
Chloride: 106 mmol/L (ref 101–111)
Creatinine, Ser: 0.7 mg/dL (ref 0.44–1.00)
Creatinine, Ser: 0.4 mg/dL — ABNORMAL LOW (ref 0.44–1.00)
Creatinine, Ser: 0.6 mg/dL (ref 0.44–1.00)
Creatinine, Ser: 0.5 mg/dL (ref 0.44–1.00)
Creatinine, Ser: 0.5 mg/dL (ref 0.44–1.00)
Creatinine, Ser: 0.5 mg/dL (ref 0.44–1.00)
Creatinine, Ser: 0.5 mg/dL (ref 0.44–1.00)
Creatinine, Ser: 0.6 mg/dL (ref 0.44–1.00)
Glucose, Bld: 186 mg/dL — ABNORMAL HIGH (ref 65–99)
Glucose, Bld: 148 mg/dL — ABNORMAL HIGH (ref 65–99)
Glucose, Bld: 174 mg/dL — ABNORMAL HIGH (ref 65–99)
Glucose, Bld: 129 mg/dL — ABNORMAL HIGH (ref 65–99)
Glucose, Bld: 151 mg/dL — ABNORMAL HIGH (ref 65–99)
Glucose, Bld: 126 mg/dL — ABNORMAL HIGH (ref 65–99)
Glucose, Bld: 168 mg/dL — ABNORMAL HIGH (ref 65–99)
Glucose, Bld: 129 mg/dL — ABNORMAL HIGH (ref 65–99)
HCT: 47 % — ABNORMAL HIGH (ref 36.0–46.0)
HCT: 35 % — ABNORMAL LOW (ref 36.0–46.0)
HCT: 44 % (ref 36.0–46.0)
HCT: 33 % — ABNORMAL LOW (ref 36.0–46.0)
HCT: 37 % (ref 36.0–46.0)
HCT: 32 % — ABNORMAL LOW (ref 36.0–46.0)
HCT: 35 % — ABNORMAL LOW (ref 36.0–46.0)
HCT: 42 % (ref 36.0–46.0)
Hemoglobin: 16 g/dL — ABNORMAL HIGH (ref 12.0–15.0)
Hemoglobin: 11.9 g/dL — ABNORMAL LOW (ref 12.0–15.0)
Hemoglobin: 15 g/dL (ref 12.0–15.0)
Hemoglobin: 11.2 g/dL — ABNORMAL LOW (ref 12.0–15.0)
Hemoglobin: 12.6 g/dL (ref 12.0–15.0)
Hemoglobin: 10.9 g/dL — ABNORMAL LOW (ref 12.0–15.0)
Hemoglobin: 11.9 g/dL — ABNORMAL LOW (ref 12.0–15.0)
Hemoglobin: 14.3 g/dL (ref 12.0–15.0)
Potassium: 4.2 mmol/L (ref 3.5–5.1)
Potassium: 4 mmol/L (ref 3.5–5.1)
Potassium: 4 mmol/L (ref 3.5–5.1)
Potassium: 3.6 mmol/L (ref 3.5–5.1)
Potassium: 4.5 mmol/L (ref 3.5–5.1)
Potassium: 4.9 mmol/L (ref 3.5–5.1)
Potassium: 3.6 mmol/L (ref 3.5–5.1)
Potassium: 4.6 mmol/L (ref 3.5–5.1)
Sodium: 137 mmol/L (ref 135–145)
Sodium: 132 mmol/L — ABNORMAL LOW (ref 135–145)
Sodium: 136 mmol/L (ref 135–145)
Sodium: 134 mmol/L — ABNORMAL LOW (ref 135–145)
Sodium: 135 mmol/L (ref 135–145)
Sodium: 138 mmol/L (ref 135–145)
Sodium: 138 mmol/L (ref 135–145)
Sodium: 140 mmol/L (ref 135–145)
TCO2: 28 mmol/L (ref 0–100)
TCO2: 26 mmol/L (ref 0–100)
TCO2: 27 mmol/L (ref 0–100)
TCO2: 25 mmol/L (ref 0–100)
TCO2: 26 mmol/L (ref 0–100)
TCO2: 21 mmol/L (ref 0–100)
TCO2: 23 mmol/L (ref 0–100)
TCO2: 22 mmol/L (ref 0–100)

## 2015-06-19 LAB — BASIC METABOLIC PANEL
Anion gap: 15 (ref 5–15)
BUN: 24 mg/dL — ABNORMAL HIGH (ref 6–20)
CO2: 23 mmol/L (ref 22–32)
Calcium: 10.1 mg/dL (ref 8.9–10.3)
Chloride: 99 mmol/L — ABNORMAL LOW (ref 101–111)
Creatinine, Ser: 1.11 mg/dL — ABNORMAL HIGH (ref 0.44–1.00)
GFR calc Af Amer: >60 mL/min (ref >60)
GFR calc non Af Amer: 54 mL/min — ABNORMAL LOW (ref >60)
Glucose, Bld: 176 mg/dL — ABNORMAL HIGH (ref 65–99)
Potassium: 4 mmol/L (ref 3.5–5.1)
Sodium: 137 mmol/L (ref 135–145)

## 2015-06-19 LAB — CBC
HCT: 45.5 % (ref 36.0–46.0)
HCT: 41.2 % (ref 36.0–46.0)
HCT: 38.2 % (ref 36.0–46.0)
Hemoglobin: 14.9 g/dL (ref 12.0–15.0)
Hemoglobin: 13.3 g/dL (ref 12.0–15.0)
Hemoglobin: 12.7 g/dL (ref 12.0–15.0)
MCH: 28.9 pg (ref 26.0–34.0)
MCH: 28.5 pg (ref 26.0–34.0)
MCH: 29.5 pg (ref 26.0–34.0)
MCHC: 32.7 g/dL (ref 30.0–36.0)
MCHC: 32.3 g/dL (ref 30.0–36.0)
MCHC: 33.2 g/dL (ref 30.0–36.0)
MCV: 88.3 fL (ref 78.0–100.0)
MCV: 88.4 fL (ref 78.0–100.0)
MCV: 88.8 fL (ref 78.0–100.0)
Platelets: 290 10*3/uL (ref 150–400)
Platelets: 192 10*3/uL (ref 150–400)
Platelets: 191 10*3/uL (ref 150–400)
RBC: 5.15 MIL/uL — ABNORMAL HIGH (ref 3.87–5.11)
RBC: 4.66 MIL/uL (ref 3.87–5.11)
RBC: 4.3 MIL/uL (ref 3.87–5.11)
RDW: 15.2 % (ref 11.5–15.5)
RDW: 14.8 % (ref 11.5–15.5)
RDW: 15.1 % (ref 11.5–15.5)
WBC: 12 10*3/uL — ABNORMAL HIGH (ref 4.0–10.5)
WBC: 20.7 10*3/uL — ABNORMAL HIGH (ref 4.0–10.5)
WBC: 15.3 10*3/uL — ABNORMAL HIGH (ref 4.0–10.5)

## 2015-06-19 LAB — HEMOGLOBIN AND HEMATOCRIT, BLOOD
HCT: 29.9 % — ABNORMAL LOW (ref 36.0–46.0)
Hemoglobin: 10.1 g/dL — ABNORMAL LOW (ref 12.0–15.0)

## 2015-06-19 LAB — PLATELET COUNT: Platelets: 203 10*3/uL (ref 150–400)

## 2015-06-19 LAB — POCT I-STAT 4, (NA,K, GLUC, HGB,HCT)
Glucose, Bld: 122 mg/dL — ABNORMAL HIGH (ref 65–99)
HCT: 41 % (ref 36.0–46.0)
Hemoglobin: 13.9 g/dL (ref 12.0–15.0)
Potassium: 3.4 mmol/L — ABNORMAL LOW (ref 3.5–5.1)
Sodium: 142 mmol/L (ref 135–145)

## 2015-06-19 LAB — APTT
aPTT: 71 seconds — ABNORMAL HIGH (ref 24–37)
aPTT: 33 seconds (ref 24–37)

## 2015-06-19 LAB — PROTIME-INR
INR: 1.51 — ABNORMAL HIGH (ref 0.00–1.49)
Prothrombin Time: 18.3 seconds — ABNORMAL HIGH (ref 11.6–15.2)

## 2015-06-19 LAB — CREATININE, SERUM
Creatinine, Ser: 0.7 mg/dL (ref 0.44–1.00)
GFR calc Af Amer: >60 mL/min (ref >60)
GFR calc non Af Amer: >60 mL/min (ref >60)

## 2015-06-19 LAB — GLUCOSE, CAPILLARY: Glucose-Capillary: 209 mg/dL — ABNORMAL HIGH (ref 65–99)

## 2015-06-19 LAB — HEPARIN LEVEL (UNFRACTIONATED): Heparin Unfractionated: 0.45 IU/mL (ref 0.30–0.70)

## 2015-06-19 LAB — MAGNESIUM: Magnesium: 2.8 mg/dL — ABNORMAL HIGH (ref 1.7–2.4)

## 2015-06-19 SURGERY — CORONARY ARTERY BYPASS GRAFTING (CABG)
Anesthesia: General | Site: Chest

## 2015-06-19 MED ORDER — VECURONIUM BROMIDE 10 MG IV SOLR
INTRAVENOUS | Status: DC | PRN
  Administered 2015-06-19 (×5): 5 mg via INTRAVENOUS

## 2015-06-19 MED ORDER — SODIUM CHLORIDE 0.9% FLUSH
3.0000 mL | Freq: Two times a day (BID) | INTRAVENOUS | Status: DC
  Administered 2015-06-20 – 2015-06-24 (×7): 3 mL via INTRAVENOUS

## 2015-06-19 MED ORDER — MORPHINE SULFATE (PF) 2 MG/ML IV SOLN
2.0000 mg | INTRAVENOUS | Status: DC | PRN
  Administered 2015-06-19 – 2015-06-20 (×6): 4 mg via INTRAVENOUS
  Administered 2015-06-21 (×2): 2 mg via INTRAVENOUS
  Filled 2015-06-19: qty 1
  Filled 2015-06-19 (×3): qty 2
  Filled 2015-06-19 (×2): qty 1
  Filled 2015-06-19 (×2): qty 2

## 2015-06-19 MED ORDER — FENTANYL CITRATE (PF) 250 MCG/5ML IJ SOLN
INTRAMUSCULAR | Status: AC
  Filled 2015-06-19: qty 30

## 2015-06-19 MED ORDER — TRAMADOL HCL 50 MG PO TABS
50.0000 mg | ORAL_TABLET | ORAL | Status: DC | PRN
  Administered 2015-06-20 – 2015-06-24 (×10): 100 mg via ORAL
  Filled 2015-06-19 (×11): qty 2

## 2015-06-19 MED ORDER — ACETAMINOPHEN 500 MG PO TABS
1000.0000 mg | ORAL_TABLET | Freq: Four times a day (QID) | ORAL | Status: AC
  Administered 2015-06-20 – 2015-06-24 (×19): 1000 mg via ORAL
  Filled 2015-06-19 (×21): qty 2

## 2015-06-19 MED ORDER — DEXTROSE 5 % IV SOLN
1.5000 g | Freq: Two times a day (BID) | INTRAVENOUS | Status: DC
  Administered 2015-06-19 – 2015-06-20 (×2): 1.5 g via INTRAVENOUS
  Filled 2015-06-19 (×2): qty 1.5

## 2015-06-19 MED ORDER — DEXTROSE 5 % IV SOLN
INTRAVENOUS | Status: DC | PRN
  Administered 2015-06-19: 08:00:00 via INTRAVENOUS

## 2015-06-19 MED ORDER — CHLORHEXIDINE GLUCONATE 0.12% ORAL RINSE (MEDLINE KIT)
15.0000 mL | Freq: Two times a day (BID) | OROMUCOSAL | Status: DC
Start: 2015-06-19 — End: 2015-06-19

## 2015-06-19 MED ORDER — SODIUM CHLORIDE 0.9 % IV SOLN
INTRAVENOUS | Status: DC
  Filled 2015-06-19 (×3): qty 2.5

## 2015-06-19 MED ORDER — SODIUM CHLORIDE 0.9 % IJ SOLN
INTRAMUSCULAR | Status: DC | PRN
  Administered 2015-06-19 (×3): via TOPICAL

## 2015-06-19 MED ORDER — DEXTROSE 5 % IV SOLN
1.5000 g | INTRAVENOUS | Status: DC | PRN
  Administered 2015-06-19: .75 g via INTRAVENOUS
  Administered 2015-06-19: 1.5 g via INTRAVENOUS

## 2015-06-19 MED ORDER — METOPROLOL TARTRATE 12.5 MG HALF TABLET
12.5000 mg | ORAL_TABLET | Freq: Two times a day (BID) | ORAL | Status: DC
Start: 2015-06-19 — End: 2015-06-25
  Administered 2015-06-20 – 2015-06-25 (×10): 12.5 mg via ORAL
  Filled 2015-06-19 (×11): qty 1

## 2015-06-19 MED ORDER — INSULIN REGULAR BOLUS VIA INFUSION
0.0000 [IU] | Freq: Three times a day (TID) | INTRAVENOUS | Status: DC
  Filled 2015-06-19: qty 10

## 2015-06-19 MED ORDER — OXYCODONE HCL 5 MG PO TABS
5.0000 mg | ORAL_TABLET | ORAL | Status: DC | PRN
  Administered 2015-06-23: 5 mg via ORAL
  Administered 2015-06-23: 10 mg via ORAL
  Filled 2015-06-19: qty 2
  Filled 2015-06-19: qty 1

## 2015-06-19 MED ORDER — POTASSIUM CHLORIDE 10 MEQ/50ML IV SOLN
10.0000 meq | Freq: Once | INTRAVENOUS | Status: AC
  Administered 2015-06-19: 10 meq via INTRAVENOUS

## 2015-06-19 MED ORDER — MAGNESIUM SULFATE 4 GM/100ML IV SOLN
4.0000 g | Freq: Once | INTRAVENOUS | Status: AC
  Administered 2015-06-19: 4 g via INTRAVENOUS
  Filled 2015-06-19: qty 100

## 2015-06-19 MED ORDER — BISACODYL 5 MG PO TBEC
10.0000 mg | DELAYED_RELEASE_TABLET | Freq: Every day | ORAL | Status: DC
  Administered 2015-06-20 – 2015-06-24 (×5): 10 mg via ORAL
  Filled 2015-06-19 (×5): qty 2

## 2015-06-19 MED ORDER — BISACODYL 10 MG RE SUPP: 10.0000 mg | Freq: Every day | RECTAL | Status: DC

## 2015-06-19 MED ORDER — VANCOMYCIN HCL IN DEXTROSE 1-5 GM/200ML-% IV SOLN
1000.0000 mg | Freq: Once | INTRAVENOUS | Status: AC
  Administered 2015-06-19: 1000 mg via INTRAVENOUS
  Filled 2015-06-19: qty 200

## 2015-06-19 MED ORDER — PROTAMINE SULFATE 10 MG/ML IV SOLN
INTRAVENOUS | Status: AC
  Filled 2015-06-19: qty 5

## 2015-06-19 MED ORDER — MIDAZOLAM HCL 10 MG/2ML IJ SOLN
INTRAMUSCULAR | Status: AC
  Filled 2015-06-19: qty 2

## 2015-06-19 MED ORDER — ONDANSETRON HCL 4 MG/2ML IJ SOLN
4.0000 mg | Freq: Four times a day (QID) | INTRAMUSCULAR | Status: DC | PRN
  Administered 2015-06-20: 4 mg via INTRAVENOUS
  Filled 2015-06-19: qty 2

## 2015-06-19 MED ORDER — VANCOMYCIN HCL 1000 MG IV SOLR
1500.0000 mg | INTRAVENOUS | Status: DC | PRN
  Administered 2015-06-19: 1500 mg via INTRAVENOUS

## 2015-06-19 MED ORDER — FAMOTIDINE IN NACL 20-0.9 MG/50ML-% IV SOLN
20.0000 mg | Freq: Two times a day (BID) | INTRAVENOUS | Status: DC
  Administered 2015-06-19: 20 mg via INTRAVENOUS

## 2015-06-19 MED ORDER — PROTAMINE SULFATE 10 MG/ML IV SOLN
INTRAVENOUS | Status: DC | PRN
  Administered 2015-06-19: 350 mg via INTRAVENOUS

## 2015-06-19 MED ORDER — ASPIRIN 81 MG PO CHEW: 324.0000 mg | CHEWABLE_TABLET | Freq: Every day | ORAL | Status: DC

## 2015-06-19 MED ORDER — ACETAMINOPHEN 160 MG/5ML PO SOLN: 1000.0000 mg | Freq: Four times a day (QID) | ORAL | Status: AC

## 2015-06-19 MED ORDER — LACTATED RINGERS IV SOLN: 500.0000 mL | Freq: Once | INTRAVENOUS | Status: DC | PRN

## 2015-06-19 MED ORDER — DEXMEDETOMIDINE HCL IN NACL 200 MCG/50ML IV SOLN
0.0000 ug/kg/h | INTRAVENOUS | Status: DC
  Filled 2015-06-19: qty 50

## 2015-06-19 MED ORDER — DOPAMINE-DEXTROSE 3.2-5 MG/ML-% IV SOLN: 0.0000 ug/kg/min | INTRAVENOUS | Status: DC

## 2015-06-19 MED ORDER — SODIUM CHLORIDE 0.9 % IV SOLN: INTRAVENOUS | Status: DC

## 2015-06-19 MED ORDER — DOPAMINE-DEXTROSE 1.6-5 MG/ML-% IV SOLN
INTRAVENOUS | Status: DC | PRN
  Administered 2015-06-19: 3 ug/kg/min via INTRAVENOUS

## 2015-06-19 MED ORDER — CALCIUM CHLORIDE 10 % IV SOLN
INTRAVENOUS | Status: DC | PRN
  Administered 2015-06-19: 250 mg via INTRAVENOUS
  Administered 2015-06-19: 500 mg via INTRAVENOUS

## 2015-06-19 MED ORDER — HEMOSTATIC AGENTS (NO CHARGE) OPTIME
TOPICAL | Status: DC | PRN
  Administered 2015-06-19: 1 via TOPICAL

## 2015-06-19 MED ORDER — POTASSIUM CHLORIDE 10 MEQ/50ML IV SOLN
10.0000 meq | INTRAVENOUS | Status: AC
  Administered 2015-06-19 (×3): 10 meq via INTRAVENOUS

## 2015-06-19 MED ORDER — LIDOCAINE HCL (CARDIAC) 20 MG/ML IV SOLN
INTRAVENOUS | Status: DC | PRN
  Administered 2015-06-19: 80 mg via INTRAVENOUS

## 2015-06-19 MED ORDER — MIDAZOLAM HCL 5 MG/5ML IJ SOLN
INTRAMUSCULAR | Status: DC | PRN
  Administered 2015-06-19: 3 mg via INTRAVENOUS
  Administered 2015-06-19: 2 mg via INTRAVENOUS
  Administered 2015-06-19: 4 mg via INTRAVENOUS
  Administered 2015-06-19 (×2): 3 mg via INTRAVENOUS
  Administered 2015-06-19: 2 mg via INTRAVENOUS
  Administered 2015-06-19: 3 mg via INTRAVENOUS

## 2015-06-19 MED ORDER — SODIUM CHLORIDE 0.9 % IV SOLN: 250.0000 mL | INTRAVENOUS | Status: DC

## 2015-06-19 MED ORDER — FENTANYL CITRATE (PF) 250 MCG/5ML IJ SOLN
INTRAMUSCULAR | Status: AC
  Filled 2015-06-19: qty 10

## 2015-06-19 MED ORDER — HEPARIN SODIUM (PORCINE) 1000 UNIT/ML IJ SOLN
INTRAMUSCULAR | Status: AC
  Filled 2015-06-19: qty 1

## 2015-06-19 MED ORDER — ALBUMIN HUMAN 5 % IV SOLN
250.0000 mL | INTRAVENOUS | Status: AC | PRN
  Administered 2015-06-19 (×3): 250 mL via INTRAVENOUS
  Filled 2015-06-19: qty 250

## 2015-06-19 MED ORDER — METOPROLOL TARTRATE 1 MG/ML IV SOLN: 2.5000 mg | INTRAVENOUS | Status: DC | PRN

## 2015-06-19 MED ORDER — 0.9 % SODIUM CHLORIDE (POUR BTL) OPTIME
TOPICAL | Status: DC | PRN
  Administered 2015-06-19: 6000 mL

## 2015-06-19 MED ORDER — SODIUM CHLORIDE 0.9 % IV SOLN
INTRAVENOUS | Status: DC | PRN
  Administered 2015-06-19: 08:00:00 via INTRAVENOUS

## 2015-06-19 MED ORDER — LEVALBUTEROL HCL 1.25 MG/0.5ML IN NEBU
1.2500 mg | INHALATION_SOLUTION | Freq: Four times a day (QID) | RESPIRATORY_TRACT | Status: DC
  Administered 2015-06-19 – 2015-06-20 (×2): 1.25 mg via RESPIRATORY_TRACT
  Filled 2015-06-19 (×2): qty 0.5

## 2015-06-19 MED ORDER — PHENYLEPHRINE HCL 10 MG/ML IJ SOLN
INTRAMUSCULAR | Status: DC | PRN
  Administered 2015-06-19: 80 ug via INTRAVENOUS
  Administered 2015-06-19: 120 ug via INTRAVENOUS
  Administered 2015-06-19: 80 ug via INTRAVENOUS

## 2015-06-19 MED ORDER — LACTATED RINGERS IV SOLN
INTRAVENOUS | Status: DC | PRN
  Administered 2015-06-19 (×2): via INTRAVENOUS

## 2015-06-19 MED ORDER — PROPOFOL 10 MG/ML IV BOLUS
INTRAVENOUS | Status: DC | PRN
  Administered 2015-06-19: 150 mg via INTRAVENOUS

## 2015-06-19 MED ORDER — LACTATED RINGERS IV SOLN: INTRAVENOUS | Status: DC

## 2015-06-19 MED ORDER — CHLORHEXIDINE GLUCONATE 0.12 % MT SOLN
15.0000 mL | OROMUCOSAL | Status: AC
  Administered 2015-06-19: 15 mL via OROMUCOSAL

## 2015-06-19 MED ORDER — ACETAMINOPHEN 650 MG RE SUPP
650.0000 mg | Freq: Once | RECTAL | Status: AC
  Administered 2015-06-19: 650 mg via RECTAL

## 2015-06-19 MED ORDER — ASPIRIN EC 325 MG PO TBEC
325.0000 mg | DELAYED_RELEASE_TABLET | Freq: Every day | ORAL | Status: DC
  Administered 2015-06-20: 325 mg via ORAL
  Filled 2015-06-19: qty 1

## 2015-06-19 MED ORDER — SODIUM CHLORIDE 0.9% FLUSH
3.0000 mL | INTRAVENOUS | Status: DC | PRN
Start: 2015-06-20 — End: 2015-06-25

## 2015-06-19 MED ORDER — MORPHINE SULFATE (PF) 2 MG/ML IV SOLN
1.0000 mg | INTRAVENOUS | Status: AC | PRN
  Filled 2015-06-19: qty 2

## 2015-06-19 MED ORDER — METOPROLOL TARTRATE 25 MG/10 ML ORAL SUSPENSION: 12.5000 mg | Freq: Two times a day (BID) | ORAL | Status: DC

## 2015-06-19 MED ORDER — PANTOPRAZOLE SODIUM 40 MG PO TBEC
40.0000 mg | DELAYED_RELEASE_TABLET | Freq: Every day | ORAL | Status: DC
  Administered 2015-06-21 – 2015-06-25 (×5): 40 mg via ORAL
  Filled 2015-06-19 (×5): qty 1

## 2015-06-19 MED ORDER — MOMETASONE FURO-FORMOTEROL FUM 200-5 MCG/ACT IN AERO
2.0000 | INHALATION_SPRAY | Freq: Two times a day (BID) | RESPIRATORY_TRACT | Status: DC
  Administered 2015-06-19 – 2015-06-24 (×10): 2 via RESPIRATORY_TRACT
  Filled 2015-06-19 (×2): qty 8.8

## 2015-06-19 MED ORDER — SODIUM CHLORIDE 0.45 % IV SOLN: INTRAVENOUS | Status: DC | PRN

## 2015-06-19 MED ORDER — MIDAZOLAM HCL 2 MG/2ML IJ SOLN: 2.0000 mg | INTRAMUSCULAR | Status: DC | PRN

## 2015-06-19 MED ORDER — DOCUSATE SODIUM 100 MG PO CAPS
200.0000 mg | ORAL_CAPSULE | Freq: Every day | ORAL | Status: DC
  Administered 2015-06-20 – 2015-06-25 (×6): 200 mg via ORAL
  Filled 2015-06-19 (×6): qty 2

## 2015-06-19 MED ORDER — ARTIFICIAL TEARS OP OINT
TOPICAL_OINTMENT | OPHTHALMIC | Status: DC | PRN
  Administered 2015-06-19: 1 via OPHTHALMIC

## 2015-06-19 MED ORDER — PROTAMINE SULFATE 10 MG/ML IV SOLN
INTRAVENOUS | Status: AC
  Filled 2015-06-19: qty 25

## 2015-06-19 MED ORDER — SODIUM CHLORIDE 0.9 % IV SOLN
20.0000 mg | INTRAVENOUS | Status: DC | PRN
  Administered 2015-06-19: 20 ug/min via INTRAVENOUS

## 2015-06-19 MED ORDER — ACETAMINOPHEN 160 MG/5ML PO SOLN: 650.0000 mg | Freq: Once | ORAL | Status: AC

## 2015-06-19 MED ORDER — NITROGLYCERIN IN D5W 200-5 MCG/ML-% IV SOLN: 0.0000 ug/min | INTRAVENOUS | Status: DC

## 2015-06-19 MED ORDER — CETYLPYRIDINIUM CHLORIDE 0.05 % MT LIQD: 7.0000 mL | Freq: Two times a day (BID) | OROMUCOSAL | Status: DC

## 2015-06-19 MED ORDER — ROCURONIUM BROMIDE 100 MG/10ML IV SOLN
INTRAVENOUS | Status: DC | PRN
  Administered 2015-06-19: 50 mg via INTRAVENOUS

## 2015-06-19 MED ORDER — PROPOFOL 10 MG/ML IV BOLUS
INTRAVENOUS | Status: AC
  Filled 2015-06-19: qty 20

## 2015-06-19 MED ORDER — FENTANYL CITRATE (PF) 100 MCG/2ML IJ SOLN
INTRAMUSCULAR | Status: DC | PRN
  Administered 2015-06-19: 250 ug via INTRAVENOUS
  Administered 2015-06-19: 300 ug via INTRAVENOUS
  Administered 2015-06-19: 250 ug via INTRAVENOUS
  Administered 2015-06-19: 100 ug via INTRAVENOUS
  Administered 2015-06-19 (×2): 250 ug via INTRAVENOUS
  Administered 2015-06-19: 100 ug via INTRAVENOUS
  Administered 2015-06-19: 500 ug via INTRAVENOUS

## 2015-06-19 MED ORDER — CHLORHEXIDINE GLUCONATE 0.12 % MT SOLN
15.0000 mL | Freq: Two times a day (BID) | OROMUCOSAL | Status: DC
  Administered 2015-06-19: 15 mL via OROMUCOSAL

## 2015-06-19 MED ORDER — METHYLPREDNISOLONE SODIUM SUCC 125 MG IJ SOLR
INTRAMUSCULAR | Status: AC
  Filled 2015-06-19: qty 2

## 2015-06-19 MED ORDER — ANTISEPTIC ORAL RINSE SOLUTION (CORINZ)
7.0000 mL | OROMUCOSAL | Status: DC
  Administered 2015-06-19 (×2): 7 mL via OROMUCOSAL

## 2015-06-19 MED ORDER — PHENYLEPHRINE HCL 10 MG/ML IJ SOLN
0.0000 ug/min | INTRAVENOUS | Status: DC
  Filled 2015-06-19 (×2): qty 2

## 2015-06-19 MED ORDER — HEPARIN SODIUM (PORCINE) 1000 UNIT/ML IJ SOLN
INTRAMUSCULAR | Status: DC | PRN
  Administered 2015-06-19: 3000 [IU] via INTRAVENOUS
  Administered 2015-06-19: 37000 [IU] via INTRAVENOUS

## 2015-06-19 MED FILL — Magnesium Sulfate Inj 50%: INTRAMUSCULAR | Qty: 10 | Status: AC

## 2015-06-19 MED FILL — Potassium Chloride Inj 2 mEq/ML: INTRAVENOUS | Qty: 40 | Status: AC

## 2015-06-19 MED FILL — Heparin Sodium (Porcine) Inj 1000 Unit/ML: INTRAMUSCULAR | Qty: 30 | Status: AC

## 2015-06-19 SURGICAL SUPPLY — 107 items
ADAPTER CARDIO PERF ANTE/RETRO (ADAPTER) ×4 IMPLANT
ADPR PRFSN 84XANTGRD RTRGD (ADAPTER) ×2
BAG DECANTER FOR FLEXI CONT (MISCELLANEOUS) ×4 IMPLANT
BANDAGE ACE 4X5 VEL STRL LF (GAUZE/BANDAGES/DRESSINGS) ×2 IMPLANT
BANDAGE ACE 6X5 VEL STRL LF (GAUZE/BANDAGES/DRESSINGS) ×2 IMPLANT
BANDAGE ELASTIC 4 VELCRO ST LF (GAUZE/BANDAGES/DRESSINGS) ×4 IMPLANT
BANDAGE ELASTIC 6 VELCRO ST LF (GAUZE/BANDAGES/DRESSINGS) ×4 IMPLANT
BASKET HEART  (ORDER IN 25'S) (MISCELLANEOUS) ×1
BASKET HEART (ORDER IN 25'S) (MISCELLANEOUS) ×1
BASKET HEART (ORDER IN 25S) (MISCELLANEOUS) ×2 IMPLANT
BINDER BREAST XXLRG (GAUZE/BANDAGES/DRESSINGS) ×2 IMPLANT
BLADE STERNUM SYSTEM 6 (BLADE) ×4 IMPLANT
BLADE SURG 12 STRL SS (BLADE) ×4 IMPLANT
BLADE SURG ROTATE 9660 (MISCELLANEOUS) IMPLANT
BNDG GAUZE ELAST 4 BULKY (GAUZE/BANDAGES/DRESSINGS) ×4 IMPLANT
CANISTER SUCTION 2500CC (MISCELLANEOUS) ×8 IMPLANT
CANNULA ARTERIAL NVNT 3/8 20FR (MISCELLANEOUS) ×2 IMPLANT
CANNULA GUNDRY RCSP 15FR (MISCELLANEOUS) ×4 IMPLANT
CATH CPB KIT VANTRIGT (MISCELLANEOUS) ×4 IMPLANT
CATH ROBINSON RED A/P 18FR (CATHETERS) ×12 IMPLANT
CATH THORACIC 36FR RT ANG (CATHETERS) ×4 IMPLANT
CLIP FOGARTY SPRING 6M (CLIP) ×2 IMPLANT
CLIP RETRACTION 3.0MM CORONARY (MISCELLANEOUS) ×2 IMPLANT
COVER SURGICAL LIGHT HANDLE (MISCELLANEOUS) ×4 IMPLANT
CRADLE DONUT ADULT HEAD (MISCELLANEOUS) ×4 IMPLANT
DRAIN CHANNEL 32F RND 10.7 FF (WOUND CARE) ×4 IMPLANT
DRAIN WOUND SNY 15 RND (WOUND CARE) ×2 IMPLANT
DRAPE CARDIOVASCULAR INCISE (DRAPES) ×4
DRAPE SLUSH/WARMER DISC (DRAPES) ×4 IMPLANT
DRAPE SRG 135X102X78XABS (DRAPES) ×2 IMPLANT
DRSG AQUACEL AG ADV 3.5X14 (GAUZE/BANDAGES/DRESSINGS) ×4 IMPLANT
ELECT BLADE 4.0 EZ CLEAN MEGAD (MISCELLANEOUS) ×4
ELECT BLADE 6.5 EXT (BLADE) ×4 IMPLANT
ELECT CAUTERY BLADE 6.4 (BLADE) ×4 IMPLANT
ELECT REM PT RETURN 9FT ADLT (ELECTROSURGICAL) ×8
ELECTRODE BLDE 4.0 EZ CLN MEGD (MISCELLANEOUS) ×2 IMPLANT
ELECTRODE REM PT RTRN 9FT ADLT (ELECTROSURGICAL) ×4 IMPLANT
EVACUATOR SILICONE 100CC (DRAIN) ×2 IMPLANT
FELT TEFLON 1X6 (MISCELLANEOUS) ×4 IMPLANT
GAUZE SPONGE 4X4 12PLY STRL (GAUZE/BANDAGES/DRESSINGS) ×8 IMPLANT
GLOVE BIO SURGEON STRL SZ7.5 (GLOVE) ×12 IMPLANT
GOWN STRL REUS W/ TWL LRG LVL3 (GOWN DISPOSABLE) ×8 IMPLANT
GOWN STRL REUS W/TWL LRG LVL3 (GOWN DISPOSABLE) ×28
GRAFT VASC ANGIOGRAFT 61-70 (Tissue) IMPLANT
HEMOSTAT POWDER SURGIFOAM 1G (HEMOSTASIS) ×12 IMPLANT
HEMOSTAT SURGICEL 2X14 (HEMOSTASIS) ×4 IMPLANT
INSERT FOGARTY XLG (MISCELLANEOUS) IMPLANT
KIT BASIN OR (CUSTOM PROCEDURE TRAY) ×4 IMPLANT
KIT ROOM TURNOVER OR (KITS) ×4 IMPLANT
KIT SUCTION CATH 14FR (SUCTIONS) ×4 IMPLANT
KIT VASOVIEW 6 PRO VH 2400 (KITS) ×4 IMPLANT
LEAD PACING MYOCARDI (MISCELLANEOUS) ×4 IMPLANT
MARKER GRAFT CORONARY BYPASS (MISCELLANEOUS) ×12 IMPLANT
NS IRRIG 1000ML POUR BTL (IV SOLUTION) ×22 IMPLANT
PACK OPEN HEART (CUSTOM PROCEDURE TRAY) ×4 IMPLANT
PAD ARMBOARD 7.5X6 YLW CONV (MISCELLANEOUS) ×8 IMPLANT
PAD ELECT DEFIB RADIOL ZOLL (MISCELLANEOUS) ×4 IMPLANT
PENCIL BUTTON HOLSTER BLD 10FT (ELECTRODE) ×4 IMPLANT
PUNCH AORTIC ROTATE 4.0MM (MISCELLANEOUS) IMPLANT
PUNCH AORTIC ROTATE 4.5MM 8IN (MISCELLANEOUS) IMPLANT
PUNCH AORTIC ROTATE 5MM 8IN (MISCELLANEOUS) IMPLANT
SET CARDIOPLEGIA MPS 5001102 (MISCELLANEOUS) ×2 IMPLANT
SPONGE GAUZE 4X4 12PLY STER LF (GAUZE/BANDAGES/DRESSINGS) ×4 IMPLANT
SPONGE LAP 4X18 X RAY DECT (DISPOSABLE) ×2 IMPLANT
SURGIFLO W/THROMBIN 8M KIT (HEMOSTASIS) ×4 IMPLANT
SUT BONE WAX W31G (SUTURE) ×4 IMPLANT
SUT ETHILON 3 0 FSL (SUTURE) ×4 IMPLANT
SUT MNCRL AB 4-0 PS2 18 (SUTURE) IMPLANT
SUT PROLENE 3 0 SH DA (SUTURE) ×6 IMPLANT
SUT PROLENE 3 0 SH1 36 (SUTURE) IMPLANT
SUT PROLENE 4 0 RB 1 (SUTURE) ×4
SUT PROLENE 4 0 SH DA (SUTURE) ×4 IMPLANT
SUT PROLENE 4-0 RB1 .5 CRCL 36 (SUTURE) ×2 IMPLANT
SUT PROLENE 5 0 C 1 36 (SUTURE) IMPLANT
SUT PROLENE 6 0 C 1 30 (SUTURE) ×4 IMPLANT
SUT PROLENE 6 0 CC (SUTURE) ×12 IMPLANT
SUT PROLENE 8 0 BV175 6 (SUTURE) IMPLANT
SUT PROLENE BLUE 7 0 (SUTURE) ×8 IMPLANT
SUT SILK  1 MH (SUTURE) ×2
SUT SILK 1 MH (SUTURE) IMPLANT
SUT SILK 2 0 SH CR/8 (SUTURE) IMPLANT
SUT SILK 3 0 SH CR/8 (SUTURE) IMPLANT
SUT STEEL 6MS V (SUTURE) ×6 IMPLANT
SUT STEEL SZ 6 DBL 3X14 BALL (SUTURE) ×6 IMPLANT
SUT VIC AB 1 CTX 36 (SUTURE) ×12
SUT VIC AB 1 CTX36XBRD ANBCTR (SUTURE) ×4 IMPLANT
SUT VIC AB 2-0 CT1 27 (SUTURE) ×16
SUT VIC AB 2-0 CT1 TAPERPNT 27 (SUTURE) IMPLANT
SUT VIC AB 2-0 CTX 27 (SUTURE) ×2 IMPLANT
SUT VIC AB 3-0 SH 27 (SUTURE) ×8
SUT VIC AB 3-0 SH 27X BRD (SUTURE) IMPLANT
SUT VIC AB 3-0 X1 27 (SUTURE) ×6 IMPLANT
SUTURE E-PAK OPEN HEART (SUTURE) ×4 IMPLANT
SYSTEM SAHARA CHEST DRAIN ATS (WOUND CARE) ×4 IMPLANT
TAPE CLOTH SURG 4X10 WHT LF (GAUZE/BANDAGES/DRESSINGS) ×2 IMPLANT
TAPE CLOTH SURG 6X10 WHT LF (GAUZE/BANDAGES/DRESSINGS) ×2 IMPLANT
TAPE PAPER 2X10 WHT MICROPORE (GAUZE/BANDAGES/DRESSINGS) ×2 IMPLANT
TOWEL OR 17X24 6PK STRL BLUE (TOWEL DISPOSABLE) ×8 IMPLANT
TOWEL OR 17X26 10 PK STRL BLUE (TOWEL DISPOSABLE) ×8 IMPLANT
TRAY FOLEY IC TEMP SENS 16FR (CATHETERS) ×4 IMPLANT
TUBE CONNECTING 20'X1/4 (TUBING) ×2
TUBE CONNECTING 20X1/4 (TUBING) ×2 IMPLANT
TUBING INSUFFLATION (TUBING) ×4 IMPLANT
UNDERPAD 30X30 INCONTINENT (UNDERPADS AND DIAPERS) ×4 IMPLANT
VEIN SAPHENOUS CRYO  61-70CM (Tissue) ×2 IMPLANT
VEIN SAPHENOUS CRYO 61-70CM (Tissue) ×2 IMPLANT
WATER STERILE IRR 1000ML POUR (IV SOLUTION) ×8 IMPLANT

## 2015-06-19 NOTE — Plan of Care (Signed)
Patient undergoing CABG today, will be under CTVS care post surgery. I will sign off.   Tallyn Holroyd M.D. [facility] 06/19/2015, 10:59 AM  Pager: [phone]

## 2015-06-19 NOTE — Anesthesia Preprocedure Evaluation (Addendum)
Anesthesia Evaluation  Patient identified by MRN, date of birth, ID band Patient awake    Reviewed: Allergy & Precautions, NPO status , Patient's Chart, lab work & pertinent test results, reviewed documented beta blocker date and time   History of Anesthesia Complications (+) PONV and history of anesthetic complications  Airway Mallampati: II  TM Distance: >3 FB Neck ROM: Full    Dental  (+) Dental Advisory Given, Missing   Pulmonary Current Smoker,    Pulmonary exam normal breath sounds clear to auscultation       Cardiovascular hypertension, Pt. on home beta blockers and Pt. on medications + CAD, + Past MI and + Peripheral Vascular Disease  Normal cardiovascular exam Rhythm:Regular Rate:Normal  cath 3/29 showed severe three-vessel obstructive CAD with normal LV function  Echo 06/14/15: Study Conclusions  - Left ventricle: The cavity size was normal. Wall thickness was increased in a pattern of mild LVH. Systolic function was normal. The estimated ejection fraction was in the range of 55% to 60%. Wall motion was normal; there were no regional wall motion abnormalities. Doppler parameters are consistent with abnormal left ventricular relaxation (grade 1 diastolic dysfunction). The  E/e&' ratio is between 8-15, suggesting indeterminate LV filling pressure. - Left atrium: The atrium was normal in size.  Impressions:  - LVEF 55-60%, mild to moderate LVH, normal wall motion, diastolic dysfunction with indeterminate LV Filling pressure, normal LA size.   Neuro/Psych PSYCHIATRIC DISORDERS Anxiety CVA, No Residual Symptoms    GI/Hepatic negative GI ROS, Neg liver ROS, (+) Hepatitis -, C  Endo/Other  diabetes, Type 2Lupus Obesity  Renal/GU negative Renal ROS     Musculoskeletal  (+) Arthritis , Rheumatoid disorders,    Abdominal   Peds  Hematology negative hematology ROS (+)   Anesthesia Other Findings Day of  surgery medications reviewed with the patient.  Reproductive/Obstetrics                        Anesthesia Physical Anesthesia Plan  ASA: IV  Anesthesia Plan: General   Post-op Pain Management:    Induction: Intravenous  Airway Management Planned: Oral ETT  Additional Equipment: Arterial line, CVP, PA Cath, Ultrasound Guidance Line Placement and TEE  Intra-op Plan:   Post-operative Plan: Post-operative intubation/ventilation  Informed Consent: I have reviewed the patients History and Physical, chart, labs and discussed the procedure including the risks, benefits and alternatives for the proposed anesthesia with the patient or authorized representative who has indicated his/her understanding and acceptance.   Dental advisory given  Plan Discussed with: CRNA  Anesthesia Plan Comments: (Risks/benefits of general anesthesia discussed with patient including risk of damage to teeth, lips, gum, and tongue, nausea/vomiting, allergic reactions to medications, and the possibility of heart attack, stroke and death.  All patient questions answered.  Patient wishes to proceed.)        Anesthesia Quick Evaluation

## 2015-06-19 NOTE — Procedures (Signed)
Extubation Procedure Note  Patient Details:   Name: [name] [name] DOB: 09-Jun-1960 MRN: [id number]   Airway Documentation: Pt had audible cuff leak prior to extubation, following commands, awake. NIF -50 cmh20 and VC 0.9 L/min.  Pt extubated to 4 lpm Gray sat 98% strong cough with splinting.  Tolerated well IS instruction given.  Will continue to monitor pt.    Evaluation  O2 sats: stable throughout Complications: No apparent complications Patient did tolerate procedure well. Bilateral Breath Sounds: Clear, Diminished   Yes  Ned Grace 06/19/2015, 6:11 PM

## 2015-06-19 NOTE — Anesthesia Procedure Notes (Addendum)
Procedure Name: Intubation Date/Time: 06/19/2015 7:45 AM Performed by: Jacquiline Doe A Pre-anesthesia Checklist: Patient identified, Timeout performed, Emergency Drugs available, Suction available and Patient being monitored Patient Re-evaluated:Patient Re-evaluated prior to inductionOxygen Delivery Method: Circle system utilized Preoxygenation: Pre-oxygenation with 100% oxygen Intubation Type: IV induction and Cricoid Pressure applied Ventilation: Mask ventilation without difficulty Laryngoscope Size: Mac and 4 Grade View: Grade I Tube type: Oral Tube size: 8.0 mm Number of attempts: 1 Airway Equipment and Method: Stylet Placement Confirmation: ETT inserted through vocal cords under direct vision,  breath sounds checked- equal and bilateral and positive ETCO2 Secured at: 22 cm Tube secured with: Tape Dental Injury: Teeth and Oropharynx as per pre-operative assessment

## 2015-06-19 NOTE — Progress Notes (Deleted)
  Echocardiogram 2D Echocardiogram has been performed.  Jennette Dubin 06/19/2015, 8:12 AM

## 2015-06-19 NOTE — Progress Notes (Signed)
TCTS BRIEF SICU PROGRESS NOTE  Day of Surgery  S/P Procedure(s) (LRB): CORONARY ARTERY BYPASS GRAFTING (CABG) TIMES FOUR USING LEFT INTERNAL MAMMARY, RIGHT SAPHENOUS LEG VEIN AND CRYO SAPHENOUS VEIN (N/A) TRANSESOPHAGEAL ECHOCARDIOGRAM (TEE) (N/A)   Extubated uneventfully NSR w/ stable hemodynamics on Neo drip Breathing comfortably w/ O2 sats 99% on 4 L/min Chest tube output low UOP excellent Labs okay  Plan: Continue routine early postop  Rexene Alberts, MD 06/19/2015 8:54 PM

## 2015-06-19 NOTE — Progress Notes (Signed)
The patient was examined and preop studies reviewed. There has been no change from the prior exam and the patient is ready for surgery.  Plan CABG on [name] [name]

## 2015-06-19 NOTE — Transfer of Care (Signed)
Immediate Anesthesia Transfer of Care Note  Patient: [name] [name]  Procedure(s) Performed: Procedure(s): CORONARY ARTERY BYPASS GRAFTING (CABG) TIMES FOUR USING LEFT INTERNAL MAMMARY, RIGHT SAPHENOUS LEG VEIN AND CRYO SAPHENOUS VEIN (N/A) TRANSESOPHAGEAL ECHOCARDIOGRAM (TEE) (N/A)  Patient Location: SICU  Anesthesia Type:General  Level of Consciousness: sedated and Patient remains intubated per anesthesia plan  Airway & Oxygen Therapy: Patient remains intubated per anesthesia plan and Patient placed on Ventilator (see vital sign flow sheet for setting)  Post-op Assessment: Report given to RN and Post -op Vital signs reviewed and stable  Post vital signs: Reviewed and stable  Last Vitals:  Filed Vitals:   06/19/15 0525 06/19/15 0537  BP: 122/79 127/70  Pulse: 76 73  Temp: 36.8 C 37.1 C  Resp: 20     Complications: No apparent anesthesia complications

## 2015-06-19 NOTE — Brief Op Note (Signed)
06/12/2015 - 06/19/2015      [address]       [place] [address]             [phone]     06/12/2015 - 06/19/2015  12:50 PM  PATIENT:  [name] [name]  55 y.o. female  PRE-OPERATIVE DIAGNOSIS:  CAD  POST-OPERATIVE DIAGNOSIS:  CAD  PROCEDURE:  Procedure(s): CORONARY ARTERY BYPASS GRAFTING (CABG) TIMES FOUR USING LEFT INTERNAL MAMMARY, RIGHT SAPHENOUS LEG VEIN AND CRYO SAPHENOUS VEIN.(LIMA-LAD; SVG-RCA; CRYOVEIN-OM) TRANSESOPHAGEAL ECHOCARDIOGRAM (TEE)  SURGEON:  Surgeon(s): [name], MD  PHYSICIAN ASSISTANT: WAYNE GOLD [name]  ANESTHESIA:   general  PATIENT CONDITION:  ICU - intubated and hemodynamically stable.  PRE-OPERATIVE WEIGHT: 94kg  COMP: NO KNOWN

## 2015-06-19 NOTE — Progress Notes (Signed)
  Echocardiogram Echocardiogram Transesophageal has been performed.  Jennette Dubin 06/19/2015, 8:12 AM

## 2015-06-20 ENCOUNTER — Encounter (HOSPITAL_COMMUNITY): Payer: Self-pay | Admitting: Cardiothoracic Surgery

## 2015-06-20 ENCOUNTER — Inpatient Hospital Stay (HOSPITAL_COMMUNITY): Payer: Commercial Managed Care - HMO

## 2015-06-20 LAB — GLUCOSE, CAPILLARY
Glucose-Capillary: 107 mg/dL — ABNORMAL HIGH (ref 65–99)
Glucose-Capillary: 108 mg/dL — ABNORMAL HIGH (ref 65–99)
Glucose-Capillary: 117 mg/dL — ABNORMAL HIGH (ref 65–99)
Glucose-Capillary: 118 mg/dL — ABNORMAL HIGH (ref 65–99)
Glucose-Capillary: 118 mg/dL — ABNORMAL HIGH (ref 65–99)
Glucose-Capillary: 119 mg/dL — ABNORMAL HIGH (ref 65–99)
Glucose-Capillary: 120 mg/dL — ABNORMAL HIGH (ref 65–99)
Glucose-Capillary: 121 mg/dL — ABNORMAL HIGH (ref 65–99)
Glucose-Capillary: 123 mg/dL — ABNORMAL HIGH (ref 65–99)
Glucose-Capillary: 129 mg/dL — ABNORMAL HIGH (ref 65–99)
Glucose-Capillary: 129 mg/dL — ABNORMAL HIGH (ref 65–99)
Glucose-Capillary: 130 mg/dL — ABNORMAL HIGH (ref 65–99)
Glucose-Capillary: 133 mg/dL — ABNORMAL HIGH (ref 65–99)
Glucose-Capillary: 141 mg/dL — ABNORMAL HIGH (ref 65–99)
Glucose-Capillary: 144 mg/dL — ABNORMAL HIGH (ref 65–99)
Glucose-Capillary: 146 mg/dL — ABNORMAL HIGH (ref 65–99)
Glucose-Capillary: 153 mg/dL — ABNORMAL HIGH (ref 65–99)
Glucose-Capillary: 157 mg/dL — ABNORMAL HIGH (ref 65–99)
Glucose-Capillary: 160 mg/dL — ABNORMAL HIGH (ref 65–99)
Glucose-Capillary: 168 mg/dL — ABNORMAL HIGH (ref 65–99)
Glucose-Capillary: 172 mg/dL — ABNORMAL HIGH (ref 65–99)
Glucose-Capillary: 173 mg/dL — ABNORMAL HIGH (ref 65–99)
Glucose-Capillary: 176 mg/dL — ABNORMAL HIGH (ref 65–99)
Glucose-Capillary: 89 mg/dL (ref 65–99)
Glucose-Capillary: 98 mg/dL (ref 65–99)

## 2015-06-20 LAB — CBC
HCT: 38.4 % (ref 36.0–46.0)
HCT: 37.7 % (ref 36.0–46.0)
Hemoglobin: 12 g/dL (ref 12.0–15.0)
Hemoglobin: 11.9 g/dL — ABNORMAL LOW (ref 12.0–15.0)
MCH: 27.6 pg (ref 26.0–34.0)
MCH: 28.1 pg (ref 26.0–34.0)
MCHC: 31.3 g/dL (ref 30.0–36.0)
MCHC: 31.6 g/dL (ref 30.0–36.0)
MCV: 88.5 fL (ref 78.0–100.0)
MCV: 88.9 fL (ref 78.0–100.0)
Platelets: 195 10*3/uL (ref 150–400)
Platelets: 168 10*3/uL (ref 150–400)
RBC: 4.34 MIL/uL (ref 3.87–5.11)
RBC: 4.24 MIL/uL (ref 3.87–5.11)
RDW: 15.1 % (ref 11.5–15.5)
RDW: 15.3 % (ref 11.5–15.5)
WBC: 15.6 10*3/uL — ABNORMAL HIGH (ref 4.0–10.5)
WBC: 18.8 10*3/uL — ABNORMAL HIGH (ref 4.0–10.5)

## 2015-06-20 LAB — POCT I-STAT, CHEM 8
BUN: 15 mg/dL (ref 6–20)
Calcium, Ion: 1.23 mmol/L (ref 1.12–1.23)
Chloride: 101 mmol/L (ref 101–111)
Creatinine, Ser: 0.8 mg/dL (ref 0.44–1.00)
Glucose, Bld: 148 mg/dL — ABNORMAL HIGH (ref 65–99)
HCT: 41 % (ref 36.0–46.0)
Hemoglobin: 13.9 g/dL (ref 12.0–15.0)
Potassium: 4.2 mmol/L (ref 3.5–5.1)
Sodium: 140 mmol/L (ref 135–145)
TCO2: 25 mmol/L (ref 0–100)

## 2015-06-20 LAB — MAGNESIUM
Magnesium: 2.4 mg/dL (ref 1.7–2.4)
Magnesium: 2.3 mg/dL (ref 1.7–2.4)

## 2015-06-20 LAB — DIFFERENTIAL
Basophils Absolute: 0 10*3/uL (ref 0.0–0.1)
Basophils Relative: 0 %
Eosinophils Absolute: 0 10*3/uL (ref 0.0–0.7)
Eosinophils Relative: 0 %
Lymphocytes Relative: 10 %
Lymphs Abs: 1.5 10*3/uL (ref 0.7–4.0)
Monocytes Absolute: 1.4 10*3/uL — ABNORMAL HIGH (ref 0.1–1.0)
Monocytes Relative: 9 %
Neutro Abs: 12.7 10*3/uL — ABNORMAL HIGH (ref 1.7–7.7)
Neutrophils Relative %: 81 %

## 2015-06-20 LAB — POCT I-STAT 3, ART BLOOD GAS (G3+)
Bicarbonate: 25.4 mEq/L — ABNORMAL HIGH (ref 20.0–24.0)
O2 Saturation: 92 %
Patient temperature: 100.6
TCO2: 27 mmol/L (ref 0–100)
pCO2 arterial: 44 mmHg (ref 35.0–45.0)
pH, Arterial: 7.374 (ref 7.350–7.450)
pO2, Arterial: 68 mmHg — ABNORMAL LOW (ref 80.0–100.0)

## 2015-06-20 LAB — BASIC METABOLIC PANEL
Anion gap: 10 (ref 5–15)
BUN: 9 mg/dL (ref 6–20)
CO2: 22 mmol/L (ref 22–32)
Calcium: 8.5 mg/dL — ABNORMAL LOW (ref 8.9–10.3)
Chloride: 107 mmol/L (ref 101–111)
Creatinine, Ser: 0.72 mg/dL (ref 0.44–1.00)
GFR calc Af Amer: >60 mL/min (ref >60)
GFR calc non Af Amer: >60 mL/min (ref >60)
Glucose, Bld: 120 mg/dL — ABNORMAL HIGH (ref 65–99)
Potassium: 4.3 mmol/L (ref 3.5–5.1)
Sodium: 139 mmol/L (ref 135–145)

## 2015-06-20 LAB — CREATININE, SERUM
Creatinine, Ser: 0.93 mg/dL (ref 0.44–1.00)
GFR calc Af Amer: >60 mL/min (ref >60)
GFR calc non Af Amer: >60 mL/min (ref >60)

## 2015-06-20 MED ORDER — FUROSEMIDE 10 MG/ML IJ SOLN
20.0000 mg | Freq: Two times a day (BID) | INTRAMUSCULAR | Status: AC
  Administered 2015-06-21: 20 mg via INTRAVENOUS
  Filled 2015-06-20: qty 2

## 2015-06-20 MED ORDER — METOCLOPRAMIDE HCL 5 MG/ML IJ SOLN
10.0000 mg | Freq: Four times a day (QID) | INTRAMUSCULAR | Status: DC
  Administered 2015-06-20 – 2015-06-23 (×14): 10 mg via INTRAVENOUS
  Filled 2015-06-20 (×14): qty 2

## 2015-06-20 MED ORDER — FUROSEMIDE 10 MG/ML IJ SOLN
40.0000 mg | Freq: Once | INTRAMUSCULAR | Status: AC
  Administered 2015-06-20: 40 mg via INTRAVENOUS
  Filled 2015-06-20: qty 4

## 2015-06-20 MED ORDER — LEVALBUTEROL HCL 1.25 MG/0.5ML IN NEBU
1.2500 mg | INHALATION_SOLUTION | Freq: Four times a day (QID) | RESPIRATORY_TRACT | Status: DC
  Administered 2015-06-20 – 2015-06-21 (×6): 1.25 mg via RESPIRATORY_TRACT
  Filled 2015-06-20 (×6): qty 0.5

## 2015-06-20 MED ORDER — KETOROLAC TROMETHAMINE 15 MG/ML IJ SOLN
15.0000 mg | Freq: Once | INTRAMUSCULAR | Status: AC
  Administered 2015-06-20: 15 mg via INTRAVENOUS
  Filled 2015-06-20: qty 1

## 2015-06-20 MED ORDER — FUROSEMIDE 10 MG/ML IJ SOLN
20.0000 mg | Freq: Once | INTRAMUSCULAR | Status: AC
  Administered 2015-06-20: 20 mg via INTRAVENOUS
  Filled 2015-06-20: qty 2

## 2015-06-20 MED ORDER — INSULIN DETEMIR 100 UNIT/ML ~~LOC~~ SOLN
14.0000 [IU] | Freq: Two times a day (BID) | SUBCUTANEOUS | Status: DC
  Administered 2015-06-20 – 2015-06-22 (×6): 14 [IU] via SUBCUTANEOUS
  Filled 2015-06-20 (×8): qty 0.14

## 2015-06-20 MED ORDER — INSULIN ASPART 100 UNIT/ML ~~LOC~~ SOLN
0.0000 [IU] | SUBCUTANEOUS | Status: DC
  Administered 2015-06-20 (×3): 2 [IU] via SUBCUTANEOUS

## 2015-06-20 MED ORDER — ASPIRIN EC 81 MG PO TBEC
81.0000 mg | DELAYED_RELEASE_TABLET | Freq: Every day | ORAL | Status: DC
  Administered 2015-06-21 – 2015-06-25 (×5): 81 mg via ORAL
  Filled 2015-06-20 (×5): qty 1

## 2015-06-20 MED ORDER — CLOPIDOGREL BISULFATE 75 MG PO TABS
75.0000 mg | ORAL_TABLET | Freq: Every day | ORAL | Status: DC
  Administered 2015-06-21 – 2015-06-25 (×5): 75 mg via ORAL
  Filled 2015-06-20 (×5): qty 1

## 2015-06-20 NOTE — Anesthesia Postprocedure Evaluation (Signed)
Anesthesia Post Note  Patient: [name] [name]  Procedure(s) Performed: Procedure(s) (LRB): CORONARY ARTERY BYPASS GRAFTING (CABG) TIMES FOUR USING LEFT INTERNAL MAMMARY, RIGHT SAPHENOUS LEG VEIN AND CRYO SAPHENOUS VEIN (N/A) TRANSESOPHAGEAL ECHOCARDIOGRAM (TEE) (N/A)  Patient location during evaluation: SICU Anesthesia Type: General Level of consciousness: sedated and patient remains intubated per anesthesia plan Pain management: pain level controlled Vital Signs Assessment: post-procedure vital signs reviewed and stable Respiratory status: patient remains intubated per anesthesia plan and patient on ventilator - see flowsheet for VS Cardiovascular status: stable (phenylephrine gtt) Anesthetic complications: no    Last Vitals:  Filed Vitals:   06/20/15 0600 06/20/15 0700  BP: 128/62 116/79  Pulse: 80 81  Temp: 37.5 C 37.5 C  Resp: 21 21    Last Pain:  Filed Vitals:   06/20/15 0720  PainSc: 0-No pain                 Catalina Gravel

## 2015-06-20 NOTE — Op Note (Signed)
[name] [name], ALTHEIDE NO.:  [id number]  MEDICAL RECORD NO.:  [id number]  LOCATION:  2S04C                        FACILITY:  [facility]  PHYSICIAN:  [name], M.D.  DATE OF BIRTH:  07/07/1960  DATE OF PROCEDURE:  06/19/2015 DATE OF DISCHARGE:                              OPERATIVE REPORT   OPERATION: 1. Coronary artery bypass grafting X3 (left internal mammary artery     LAD, saphenous vein graft to right coronary artery, cryopreserved     saphenous vein graft to circumflex marginal 2). 2. Endoscopic harvest of right leg vein.  Exposure and exploration of     left leg but no vein harvested.  SURGEON:  [name], M.D.  ASSISTANT:  John Giovanni, P.A.-C.  PREOPERATIVE DIAGNOSIS:  Severe three-vessel coronary artery disease, non-ST-elevation myocardial infarction.  POSTOPERATIVE DIAGNOSIS:  Severe three-vessel coronary artery disease, non-ST-elevation myocardial infarction.  ANESTHESIA:  General.  CLINICAL NOTE:  The patient is a 56 year old, obese, diabetic smoker who presented with non-ST-elevation MI and symptoms of unstable angina. Cardiac echo and catheterization were performed which demonstrated preserved LV systolic function both with LVH and severe three-vessel coronary disease in a diabetic pattern.  She was recommended for surgical coronary revascularization.  Prior to surgery, I reviewed the results of the cardiac catheterization and echo with the patient.  I discussed the indications and expected benefits of coronary bypass surgery for treatment of her severe coronary disease.  I discussed the major aspects of the surgery including the use of general anesthesia in cardiopulmonary bypass, the location of the surgical incisions, and the expected postoperative hospital recovery.  I discussed in detail with the patient the risks of the operation to her including risk of stroke, MI, bleeding, blood transfusion requirement, postoperative  infection, and postoperative pulmonary problems including pleural effusion.  After reviewing these issues, she demonstrated her understanding and agreed to proceed with surgery under what I felt was an informed consent.  OPERATIVE FINDINGS: 1. Mammary artery was small but with adequate flow. 2. Extremely poor saphenous vein conduit.  No vein was found usable in     the left leg.  Only a short segment of vein was found in the right     leg using an open thigh incision.  Because of the poor status of     the patient's native saphenous vein, a segment of cryopreserved     vein was used to the circumflex marginal -- this was a fairly long     graft and her native vein was not long enough to reach the     circumflex anastomosis. 3. Post-cardiopulmonary bypass LV function was preserved, normal.  OPERATIVE PROCEDURE:  The patient was brought to the operating room and placed supine on the operating table.  General anesthesia was induced under invasive hemodynamic monitoring.  A transesophageal echo probe was placed by the anesthesiologist.  The chest, abdomen, and legs were prepped with Betadine and draped as a sterile field.  A sternal incision was made as both legs were opened to find adequate saphenous vein conduit.  The left internal mammary artery was harvested as a pedicle graft from its origin at the subclavian vessels.  It was a small but adequate vessel with adequate flow.  A sternal retractor was placed using the deep blades due to the patient's obese body habitus.  The pericardium was opened and suspended. Pursestrings were placed in ascending aorta and right atrium.  After the saphenous vein had been harvested, the patient was heparinized and the ACT was documented as being therapeutic.  The patient was then cannulated through the pursestrings and placed on cardiopulmonary bypass.  The coronaries were identified for grafting.  The coronaries were diffusely diseased in the  diabetic type pattern.  The LAD was intramyocardial.  A proximal site for the anastomosis was identified. The circumflex was diffusely diseased and the anastomosis was placed distally.  The right coronary had high-grade proximal and mid-vessel disease and the anastomosis was placed to the distal RCA before the bifurcation.  The native saphenous vein, the cryopreserved saphenous vein, and the mammary artery were all prepared for the distal anastomoses. Cardioplegia cannulas were placed both antegrade and retrograde cold blood cardioplegia.  The patient was cooled to 32 degrees and the aortic crossclamp was applied.  A liter of cold blood cardioplegia was delivered in split doses between the antegrade aortic and retrograde coronary sinus catheters.  There was good cardioplegic arrest and septal temperature dropped less than 14 degrees.  Cardioplegia was delivered every 20 minutes.  The distal coronary anastomoses were performed.  The first distal anastomosis was to the distal RCA.  This was a 1.5-mm vessel.  It was thickened.  It had a proximal 80% stenosis.  A reverse saphenous vein of the native vein was sewn end-to-side with running 7-0 Prolene with good flow through the graft.  Cardioplegia was redosed.  The second distal anastomosis was the distal branch of the circumflex marginal.  This had diffuse disease.  It was 1.5-mm vessel.  A cryopreserved saphenous vein was sewn end-to-side with running 7-0 Prolene.  There was good flow through the graft.  Cardioplegia was redosed.  The third distal anastomosis was to the proximal portion of the LAD. More distally, it was deeply intramyocardial.  It was 1.7-mm vessel. The left IMA pedicle was brought through an opening and the left lateral pericardium was brought down onto the LAD and sewn end-to-side with running 8-0 Prolene.  There was good flow through the anastomosis after briefly releasing the pedicle bulldog on the mammary artery.   The bulldog was reapplied and the pedicle was secured to the epicardium with 6-0 Prolene.  Cardioplegia was redosed.  While the crossclamp was still in place, 2 proximal anastomoses were performed using a 4.5-mm punch and running 6-0 Prolene.  Prior to tying down the final proximal anastomosis, air was vented from the coronaries with a dose of retrograde warm blood cardioplegia.  The crossclamp was removed.  The heart resumed a spontaneous rhythm.  The vein grafts were de-aired and opened and each had good flow and hemostasis was documented at the proximal distal anastomoses.  The patient was rewarmed and reperfused.  Temporary pacing wires were applied.  The lungs were expanded.  It should be noted that the patient's left pleural space was obliterated with adhesions.  The patient was then weaned off cardiopulmonary bypass without difficulty without needing significant inotrope.  Echo showed preserved LV function.  Protamine was administered without adverse reaction.  The cannulas were removed.  The mediastinum was irrigated. The superior pericardial fat was closed over the aorta.  Anterior mediastinal left pleural chest tubes were placed and brought out through separate incisions.  The sternum was closed with wire.  The pectoralis fascia was closed with a running #1 Vicryl.  The subcutaneous and skin layers were closed in running Vicryl and sterile dressings were applied.  Total cardiopulmonary bypass time was 130 minutes.     [name], M.D.     PV/MEDQ  D:  06/19/2015  T:  06/20/2015  Job:  [id number]

## 2015-06-20 NOTE — Progress Notes (Signed)
1 Day Post-Op Procedure(s) (LRB): CORONARY ARTERY BYPASS GRAFTING (CABG) TIMES FOUR USING LEFT INTERNAL MAMMARY, RIGHT SAPHENOUS LEG VEIN AND CRYO SAPHENOUS VEIN (N/A) TRANSESOPHAGEAL ECHOCARDIOGRAM (TEE) (N/A) Subjective: Doing well after CABG 3 Stable hemodynamics off drips Progression orders in place Plan diuresis and mobilization today Because patient had cryopreserved vein use because of lack of native conduit she will resume her postop Plavix  Objective: Vital signs in last 24 hours: Temp:  [96.8 F (36 C)-100.6 F (38.1 C)] 99.5 F (37.5 C) (04/05 0800) Pulse Rate:  [70-90] 84 (04/05 0800) Cardiac Rhythm:  [-] Normal sinus rhythm (04/05 0800) Resp:  [12-25] 22 (04/05 0800) BP: (81-141)/(49-79) 129/66 mmHg (04/05 0800) SpO2:  [92 %-100 %] 95 % (04/05 0800) FiO2 (%):  [40 %-60 %] 40 % (04/04 1800) Weight:  [222 lb 0.1 oz (100.7 kg)] 222 lb 0.1 oz (100.7 kg) (04/05 0500)  Hemodynamic parameters for last 24 hours: PAP: (22-38)/(8-20) 36/17 mmHg CO:  [3.9 L/min-6.2 L/min] 6.2 L/min CI:  [1.9 L/min/m2-3 L/min/m2] 3 L/min/m2  Intake/Output from previous day: 04/04 0701 - 04/05 0700 In: 6343.1 [I.V.:4429.1; Blood:754; NG/GT:30; IV [id number] Out: [id number]; Drains:50; Blood:1400; Chest Tube:330] Intake/Output this shift: Total I/O In: 50 [IV Piggyback:50] Out: 225 [Urine:225]       Exam    General- alert and comfortable   Lungs- clear without rales, wheezes   Cor- regular rate and rhythm, no murmur , gallop   Abdomen- soft, non-tender   Extremities - warm, non-tender, minimal edema   Neuro- oriented, appropriate, no focal weakness   Lab Results:  Recent Labs  06/19/15 2039 06/20/15 0308  WBC 15.3* 15.6*  HGB 12.7 12.0  HCT 38.2 38.4  PLT 191 195   BMET:  Recent Labs  06/19/15 0407  06/19/15 2036 06/19/15 2039 06/20/15 0308  NA 137  < > 140  --  139  K 4.0  < > 4.6  --  4.3  CL 99*  < > 106  --  107  CO2 23  --   --   --  22  GLUCOSE  176*  < > 129*  --  120*  BUN 24*  < > 13  --  9  CREATININE 1.11*  < > 0.60 0.70 0.72  CALCIUM 10.1  --   --   --  8.5*  < > = values in this interval not displayed.  PT/INR:  Recent Labs  06/19/15 1455  LABPROT 18.3*  INR 1.51*   ABG    Component Value Date/Time   PHART 7.374 06/20/2015 0305   HCO3 25.4* 06/20/2015 0305   TCO2 27 06/20/2015 0305   ACIDBASEDEF 3.0* 06/19/2015 1435   O2SAT 92.0 06/20/2015 0305   CBG (last 3)   Recent Labs  06/20/15 0409 06/20/15 0514 06/20/15 0604  GLUCAP 129* 119* 133*    Assessment/Plan: S/P Procedure(s) (LRB): CORONARY ARTERY BYPASS GRAFTING (CABG) TIMES FOUR USING LEFT INTERNAL MAMMARY, RIGHT SAPHENOUS LEG VEIN AND CRYO SAPHENOUS VEIN (N/A) TRANSESOPHAGEAL ECHOCARDIOGRAM (TEE) (N/A) Mobilize Diuresis Diabetes control d/c tubes/lines See progression orders   LOS: 5 days    Tharon Aquas Trigt III 06/20/2015

## 2015-06-20 NOTE — Progress Notes (Signed)
Pt with persistent nausea despite PRN Zofran; MD paged for poss Reglan orders; new orders placed; will administer when available; pt sitting up in chair at this time; VSS; will cont. To monitor.  [name] [name] Reason

## 2015-06-20 NOTE — Progress Notes (Signed)
Patient ID: [name] [name], female   DOB: [birth year], 55 y.o.   MRN: [id number]   SICU Evening Rounds:   Hemodynamically stable   Walked a little today   Urine output good  CT output low  CBC    Component Value Date/Time   WBC 18.8* 06/20/2015 1650   RBC 4.24 06/20/2015 1650   HGB 13.9 06/20/2015 1653   HCT 41.0 06/20/2015 1653   PLT 168 06/20/2015 1650   MCV 88.9 06/20/2015 1650   MCH 28.1 06/20/2015 1650   MCHC 31.6 06/20/2015 1650   RDW 15.3 06/20/2015 1650   LYMPHSABS 1.5 06/20/2015 0308   MONOABS 1.4* 06/20/2015 0308   EOSABS 0.0 06/20/2015 0308   BASOSABS 0.0 06/20/2015 0308     BMET    Component Value Date/Time   NA 140 06/20/2015 1653   K 4.2 06/20/2015 1653   CL 101 06/20/2015 1653   CO2 22 06/20/2015 0308   GLUCOSE 148* 06/20/2015 1653   BUN 15 06/20/2015 1653   CREATININE 0.80 06/20/2015 1653   CALCIUM 8.5* 06/20/2015 0308   GFRNONAA >60 06/20/2015 1650   GFRAA >60 06/20/2015 1650     A/P:  Stable postop course. Continue current plans

## 2015-06-21 ENCOUNTER — Inpatient Hospital Stay (HOSPITAL_COMMUNITY): Payer: Commercial Managed Care - HMO

## 2015-06-21 LAB — BASIC METABOLIC PANEL
Anion gap: 9 (ref 5–15)
BUN: 12 mg/dL (ref 6–20)
CO2: 28 mmol/L (ref 22–32)
Calcium: 8.8 mg/dL — ABNORMAL LOW (ref 8.9–10.3)
Chloride: 103 mmol/L (ref 101–111)
Creatinine, Ser: 0.75 mg/dL (ref 0.44–1.00)
GFR calc Af Amer: >60 mL/min (ref >60)
GFR calc non Af Amer: >60 mL/min (ref >60)
Glucose, Bld: 128 mg/dL — ABNORMAL HIGH (ref 65–99)
Potassium: 4.1 mmol/L (ref 3.5–5.1)
Sodium: 140 mmol/L (ref 135–145)

## 2015-06-21 LAB — GLUCOSE, CAPILLARY
Glucose-Capillary: 100 mg/dL — ABNORMAL HIGH (ref 65–99)
Glucose-Capillary: 104 mg/dL — ABNORMAL HIGH (ref 65–99)
Glucose-Capillary: 110 mg/dL — ABNORMAL HIGH (ref 65–99)
Glucose-Capillary: 145 mg/dL — ABNORMAL HIGH (ref 65–99)
Glucose-Capillary: 161 mg/dL — ABNORMAL HIGH (ref 65–99)
Glucose-Capillary: 97 mg/dL (ref 65–99)

## 2015-06-21 LAB — CBC
HCT: 36.7 % (ref 36.0–46.0)
Hemoglobin: 11.5 g/dL — ABNORMAL LOW (ref 12.0–15.0)
MCH: 28.3 pg (ref 26.0–34.0)
MCHC: 31.3 g/dL (ref 30.0–36.0)
MCV: 90.2 fL (ref 78.0–100.0)
Platelets: 188 10*3/uL (ref 150–400)
RBC: 4.07 MIL/uL (ref 3.87–5.11)
RDW: 15.5 % (ref 11.5–15.5)
WBC: 20.4 10*3/uL — ABNORMAL HIGH (ref 4.0–10.5)

## 2015-06-21 MED ORDER — INSULIN ASPART 100 UNIT/ML ~~LOC~~ SOLN
0.0000 [IU] | Freq: Three times a day (TID) | SUBCUTANEOUS | Status: DC
  Administered 2015-06-21: 4 [IU] via SUBCUTANEOUS
  Administered 2015-06-21 – 2015-06-25 (×8): 2 [IU] via SUBCUTANEOUS

## 2015-06-21 MED ORDER — SODIUM CHLORIDE 0.9 % IV SOLN
250.0000 mL | INTRAVENOUS | Status: DC | PRN
Start: 2015-06-21 — End: 2015-06-25

## 2015-06-21 MED ORDER — ALUM & MAG HYDROXIDE-SIMETH 200-200-20 MG/5ML PO SUSP: 15.0000 mL | ORAL | Status: DC | PRN

## 2015-06-21 MED ORDER — MAGNESIUM HYDROXIDE 400 MG/5ML PO SUSP: 30.0000 mL | Freq: Every day | ORAL | Status: DC | PRN

## 2015-06-21 MED ORDER — POTASSIUM CHLORIDE CRYS ER 20 MEQ PO TBCR
20.0000 meq | EXTENDED_RELEASE_TABLET | Freq: Every day | ORAL | Status: DC
  Administered 2015-06-21 – 2015-06-25 (×5): 20 meq via ORAL
  Filled 2015-06-21 (×4): qty 1

## 2015-06-21 MED ORDER — LEVALBUTEROL HCL 1.25 MG/0.5ML IN NEBU
1.2500 mg | INHALATION_SOLUTION | Freq: Two times a day (BID) | RESPIRATORY_TRACT | Status: DC
  Administered 2015-06-21 – 2015-06-23 (×5): 1.25 mg via RESPIRATORY_TRACT
  Filled 2015-06-21 (×5): qty 0.5

## 2015-06-21 MED ORDER — SODIUM CHLORIDE 0.9% FLUSH: 3.0000 mL | INTRAVENOUS | Status: DC | PRN

## 2015-06-21 MED ORDER — MOVING RIGHT ALONG BOOK
Freq: Once | Status: AC
  Administered 2015-06-21: 12:00:00
  Filled 2015-06-21: qty 1

## 2015-06-21 MED ORDER — GUAIFENESIN ER 600 MG PO TB12
600.0000 mg | ORAL_TABLET | Freq: Two times a day (BID) | ORAL | Status: DC
  Administered 2015-06-21 – 2015-06-25 (×9): 600 mg via ORAL
  Filled 2015-06-21 (×9): qty 1

## 2015-06-21 MED ORDER — SODIUM CHLORIDE 0.9% FLUSH
3.0000 mL | Freq: Two times a day (BID) | INTRAVENOUS | Status: DC
  Administered 2015-06-21 – 2015-06-24 (×4): 3 mL via INTRAVENOUS

## 2015-06-21 MED ORDER — FUROSEMIDE 40 MG PO TABS
40.0000 mg | ORAL_TABLET | Freq: Every day | ORAL | Status: DC
  Administered 2015-06-22 – 2015-06-25 (×4): 40 mg via ORAL
  Filled 2015-06-21 (×4): qty 1

## 2015-06-21 MED FILL — Sodium Bicarbonate IV Soln 8.4%: INTRAVENOUS | Qty: 50 | Status: AC

## 2015-06-21 MED FILL — Electrolyte-R (PH 7.4) Solution: INTRAVENOUS | Qty: 6000 | Status: AC

## 2015-06-21 MED FILL — Lidocaine HCl IV Inj 20 MG/ML: INTRAVENOUS | Qty: 5 | Status: AC

## 2015-06-21 MED FILL — Mannitol IV Soln 20%: INTRAVENOUS | Qty: 500 | Status: AC

## 2015-06-21 MED FILL — Heparin Sodium (Porcine) Inj 1000 Unit/ML: INTRAMUSCULAR | Qty: 10 | Status: AC

## 2015-06-21 MED FILL — Sodium Chloride IV Soln 0.9%: INTRAVENOUS | Qty: 3000 | Status: AC

## 2015-06-21 NOTE — Progress Notes (Signed)
2 Days Post-Op Procedure(s) (LRB): CORONARY ARTERY BYPASS GRAFTING (CABG) TIMES FOUR USING LEFT INTERNAL MAMMARY, RIGHT SAPHENOUS LEG VEIN AND CRYO SAPHENOUS VEIN (N/A) TRANSESOPHAGEAL ECHOCARDIOGRAM (TEE) (N/A) Subjective: Nausea better Walked in hallway  Objective: Vital signs in last 24 hours: Temp:  [97.1 F (36.2 C)-99.5 F (37.5 C)] 98.4 F (36.9 C) (04/06 0400) Pulse Rate:  [72-94] 87 (04/06 0700) Cardiac Rhythm:  [-] Normal sinus rhythm (04/06 0700) Resp:  [13-27] 16 (04/06 0700) BP: (99-134)/(32-96) 102/55 mmHg (04/06 0700) SpO2:  [92 %-96 %] 92 % (04/06 0700) Weight:  [215 lb 13.3 oz (97.9 kg)] 215 lb 13.3 oz (97.9 kg) (04/06 0625)  Hemodynamic parameters for last 24 hours: PAP: (36)/(17) 36/17 mmHg  Intake/Output from previous day: 04/05 0701 - 04/06 0700 In: 1050 [P.O.:420; I.V.:580; IV Piggyback:50] Out: 2885 [Urine:2795; Drains:20; Chest Tube:70] Intake/Output this shift:         Exam    General- alert and comfortable   Lungs- clear without rales, wheezes   Cor- regular rate and rhythm, no murmur , gallop   Abdomen- soft, non-tender   Extremities - warm, non-tender, minimal edema   Neuro- oriented, appropriate, no focal weakness   Lab Results:  Recent Labs  06/20/15 1650 06/20/15 1653 06/21/15 0421  WBC 18.8*  --  20.4*  HGB 11.9* 13.9 11.5*  HCT 37.7 41.0 36.7  PLT 168  --  188   BMET:  Recent Labs  06/20/15 0308  06/20/15 1653 06/21/15 0421  NA 139  --  140 140  K 4.3  --  4.2 4.1  CL 107  --  101 103  CO2 22  --   --  28  GLUCOSE 120*  --  148* 128*  BUN 9  --  15 12  CREATININE 0.72  < > 0.80 0.75  CALCIUM 8.5*  --   --  8.8*  < > = values in this interval not displayed.  PT/INR:  Recent Labs  06/19/15 1455  LABPROT 18.3*  INR 1.51*   ABG    Component Value Date/Time   PHART 7.374 06/20/2015 0305   HCO3 25.4* 06/20/2015 0305   TCO2 25 06/20/2015 1653   ACIDBASEDEF 3.0* 06/19/2015 1435   O2SAT 92.0 06/20/2015 0305    CBG (last 3)   Recent Labs  06/20/15 1934 06/21/15 0001 06/21/15 0412  GLUCAP 146* 110* 104*    Assessment/Plan: S/P Procedure(s) (LRB): CORONARY ARTERY BYPASS GRAFTING (CABG) TIMES FOUR USING LEFT INTERNAL MAMMARY, RIGHT SAPHENOUS LEG VEIN AND CRYO SAPHENOUS VEIN (N/A) TRANSESOPHAGEAL ECHOCARDIOGRAM (TEE) (N/A) Diabetes control d/c tubes/lines Plan for transfer to step-down: see transfer orders cont plavix for cryovein conduit   LOS: 6 days    [name] [name] 06/21/2015

## 2015-06-21 NOTE — Progress Notes (Signed)
Foley removed. Pt due to void at 9:15PM on 4/6. JP drain discontinued per order. Gauze dressing applied to R thigh. Will continue to monitor.   Fritz Pickerel, RN

## 2015-06-21 NOTE — Progress Notes (Signed)
Pt received from 2S RN. Pt oriented to room and equipment. VSS, CCMD notified. Call light within reach, will continue to monitor.  Fritz Pickerel, RN

## 2015-06-21 NOTE — Progress Notes (Signed)
CARDIAC REHAB PHASE I   PRE:  Rate/Rhythm: 90 SR  BP:  Supine: 106/61  Sitting:   Standing:    SaO2: 93% 3L  MODE:  Ambulation: 280 ft   POST:  Rate/Rhythm: 100  BP:  Supine:   Sitting: 106/49  Standing:    SaO2: 88% 3L to 100% with rest 1500-1540 Pt walked 280 ft on 3L with rolling walker and asst x 1 with steady gait. Tolerated well. Stopped a few times to cough. To recliner after walk with call bell. Sats low on 3L at 88% but soon to 100% with rest.  Left on 3L.   Graylon Good, RN BSN  06/21/2015 3:34 PM

## 2015-06-21 NOTE — Progress Notes (Signed)
Chest tube removed without incident, pt tolerated well

## 2015-06-22 ENCOUNTER — Inpatient Hospital Stay (HOSPITAL_COMMUNITY): Payer: Commercial Managed Care - HMO

## 2015-06-22 ENCOUNTER — Other Ambulatory Visit: Payer: Self-pay

## 2015-06-22 LAB — CBC
HCT: 34.3 % — ABNORMAL LOW (ref 36.0–46.0)
Hemoglobin: 10.7 g/dL — ABNORMAL LOW (ref 12.0–15.0)
MCH: 28.4 pg (ref 26.0–34.0)
MCHC: 31.2 g/dL (ref 30.0–36.0)
MCV: 91 fL (ref 78.0–100.0)
Platelets: 194 10*3/uL (ref 150–400)
RBC: 3.77 MIL/uL — ABNORMAL LOW (ref 3.87–5.11)
RDW: 15.6 % — ABNORMAL HIGH (ref 11.5–15.5)
WBC: 19.9 10*3/uL — ABNORMAL HIGH (ref 4.0–10.5)

## 2015-06-22 LAB — TYPE AND SCREEN
ABO/RH(D): B POS
Antibody Screen: NEGATIVE
Unit division: 0
Unit division: 0

## 2015-06-22 LAB — BASIC METABOLIC PANEL
Anion gap: 10 (ref 5–15)
BUN: 12 mg/dL (ref 6–20)
CO2: 27 mmol/L (ref 22–32)
Calcium: 8.9 mg/dL (ref 8.9–10.3)
Chloride: 103 mmol/L (ref 101–111)
Creatinine, Ser: 0.71 mg/dL (ref 0.44–1.00)
GFR calc Af Amer: >60 mL/min (ref >60)
GFR calc non Af Amer: >60 mL/min (ref >60)
Glucose, Bld: 127 mg/dL — ABNORMAL HIGH (ref 65–99)
Potassium: 4.1 mmol/L (ref 3.5–5.1)
Sodium: 140 mmol/L (ref 135–145)

## 2015-06-22 LAB — GLUCOSE, CAPILLARY
Glucose-Capillary: 115 mg/dL — ABNORMAL HIGH (ref 65–99)
Glucose-Capillary: 113 mg/dL — ABNORMAL HIGH (ref 65–99)
Glucose-Capillary: 104 mg/dL — ABNORMAL HIGH (ref 65–99)
Glucose-Capillary: 107 mg/dL — ABNORMAL HIGH (ref 65–99)

## 2015-06-22 NOTE — Progress Notes (Signed)
CARDIAC REHAB PHASE I   PRE:  Rate/Rhythm: 81 SR    BP: sitting 114/52    SaO2: 89 1L  MODE:  Ambulation: 440 ft   POST:  Rate/Rhythm: 104 ST    BP: sitting 117/72     SaO2: 92 2L  Pt needs motivation to walk. Steady walking with RW (has one at home). Used 2L, SaO2 88-89 2L while walking, 92 2L on return to room. Encouraged x2 more walks today and also IS more often. Pt to bed, sts she has been in recliner all morning. Ed completed with fair reception. Sts she is motivated to quit smoking. Will send referral to [facility]. [place], ACSM 06/22/2015 11:56 AM

## 2015-06-22 NOTE — Progress Notes (Addendum)
Pacing wires removed. Pt VSS. Will continue to monitor. Bedrest until 1:15 PM. Bed rest completed. Pt VSS. No complaints. Call light within reach, will continue to monitor.  Fritz Pickerel, RN

## 2015-06-22 NOTE — Consult Note (Signed)
   [facility] CM Inpatient Consult   06/22/2015  [name] [name] 08/03/1960 [id number] Patient evaluated for community based chronic disease management services with [facility] as a benefit of patient's [facility]. Patient is a S/P CABG.  HX of DM.  Met  with patient at bedside to explain [facility] services.  Patient states she would like the support of post hospital monitoring as her daughter works in the day and son works at night but sleeps during the day.  She feels sure that her son will be able to take her to appointments but she would like follow up on her progress and new medications.  Consent form signed. Patient is hopeful to discharge over the weekend.  Patient will receive post hospital discharge call and will be evaluated for monthly home visits for assessments and disease process education.  Left contact information and [facility] literature at bedside. Made Inpatient Case Manager aware that [facility] following. Of note, [facility] Care Management services does not replace or interfere with any services that are arranged by inpatient case management or social work.  For additional questions or referrals please contact:   Natividad Brood, RN BSN [facility] Liaison  [phone] business mobile phone Toll free office [phone]
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
## 2015-06-22 NOTE — Care Management Note (Signed)
Case Management Note Previous CM note initiated by Jacqlyn Krauss RN, CM  Patient Details  Name: [name] [name] MRN: [id number] Date of Birth: Feb 12, 1961  Subjective/Objective:   Pt admitted for CAD/NSTEMI: Plan for CABG 06-19-15 (Plavix [place]). Pt has family support.  Pt stays with son and his family.                  Action/Plan: CM to continue to monitor post procedure for additional needs.    Expected Discharge Date:                  Expected Discharge Plan:  Home/Self Care  In-House Referral:  NA  Discharge planning Services  CM Consult  Post Acute Care Choice:    Choice offered to:     DME Arranged:    DME Agency:     HH Arranged:    HH Agency:     Status of Service:  In process, will continue to follow  Medicare Important Message Given:  Yes Date Medicare IM Given:    Medicare IM give by:    Date Additional Medicare IM Given:    Additional Medicare Important Message give by:     If discussed at [place] of Stay Meetings, dates discussed:    Additional Comments:  06/22/15- 1130- Marvetta Gibbons RN, BSN- pt tx from 2S -4/6- s/p CABG on 06/19/15-- pt stays with son and his family- anticipate return home with family- CM to follow  Dahlia Client Romeo Rabon, RN 06/22/2015, 11:34 AM

## 2015-06-22 NOTE — Care Management Important Message (Signed)
Important Message  Patient Details  Name: [name] [name] MRN: [id number] Date of Birth: 09/17/1960   Medicare Important Message Given:  Yes    Tawn Fitzner Abena 06/22/2015, 10:59 AM

## 2015-06-22 NOTE — Progress Notes (Addendum)
      [address]       [place] [address]             [phone]      3 Days Post-Op Procedure(s) (LRB): CORONARY ARTERY BYPASS GRAFTING (CABG) TIMES FOUR USING LEFT INTERNAL MAMMARY, RIGHT SAPHENOUS LEG VEIN AND CRYO SAPHENOUS VEIN (N/A) TRANSESOPHAGEAL ECHOCARDIOGRAM (TEE) (N/A)   Subjective:  [name] [name] states she is doing well for the most part.  She has some mild chest discomfort and she states her right leg up near her groin is tender.  Objective: Vital signs in last 24 hours: Temp:  [97.9 F (36.6 C)-98.6 F (37 C)] 98.4 F (36.9 C) (04/07 0432) Pulse Rate:  [80-90] 80 (04/07 0432) Cardiac Rhythm:  [-] Normal sinus rhythm (04/06 1900) Resp:  [14-21] 18 (04/07 0432) BP: (102-111)/(47-61) 105/58 mmHg (04/07 0432) SpO2:  [91 %-97 %] 97 % (04/07 0432) Weight:  [213 lb 6.5 oz (96.8 kg)] 213 lb 6.5 oz (96.8 kg) (04/07 0432)  Intake/Output from previous day: 04/06 0701 - 04/07 0700 In: -  Out: 1190 [Urine:1180; Drains:10]  General appearance: alert, cooperative and no distress Heart: regular rate and rhythm Lungs: clear to auscultation bilaterally Abdomen: soft, non-tender; bowel sounds normal; no masses,  no organomegaly Extremities: edema trace Wound: clean and dry  Lab Results:  Recent Labs  06/21/15 0421 06/22/15 0330  WBC 20.4* 19.9*  HGB 11.5* 10.7*  HCT 36.7 34.3*  PLT 188 194   BMET:  Recent Labs  06/21/15 0421 06/22/15 0330  NA 140 140  K 4.1 4.1  CL 103 103  CO2 28 27  GLUCOSE 128* 127*  BUN 12 12  CREATININE 0.75 0.71  CALCIUM 8.8* 8.9    PT/INR:  Recent Labs  06/19/15 1455  LABPROT 18.3*  INR 1.51*   ABG    Component Value Date/Time   PHART 7.374 06/20/2015 0305   HCO3 25.4* 06/20/2015 0305   TCO2 25 06/20/2015 1653   ACIDBASEDEF 3.0* 06/19/2015 1435   O2SAT 92.0 06/20/2015 0305   CBG (last 3)   Recent Labs  06/21/15 1657 06/21/15 2149 06/22/15 0641  GLUCAP 161* 97 115*     Assessment/Plan: S/P Procedure(s) (LRB): CORONARY ARTERY BYPASS GRAFTING (CABG) TIMES FOUR USING LEFT INTERNAL MAMMARY, RIGHT SAPHENOUS LEG VEIN AND CRYO SAPHENOUS VEIN (N/A) TRANSESOPHAGEAL ECHOCARDIOGRAM (TEE) (N/A)  1. CV- NSR, HR, BP controlled- will continue Lopressor, if BP improves will add low dose ACE prior to discharge 2. Pulm- wean oxygen as tolerated, continue IS, CXR with bilateral atelectasis 3. Renal- creatinine stable, weight is trending down, continue Lasix 4. DM-CBGs controlled, continue insulin today, can transition to oral medications tomorrow 5. Expected blood loss anemia, stable Hgb is at 10.7 6. Dispo- patient stable, maintaining NSR will d/c EPW today, likely d/c over the weekend once off oxygen   LOS: 7 days    [name], [name] 06/22/2015  Follow groin incision for open leg saphenous vein harvest Encourage ambulation and deep breathing patient examined and medical record reviewed,agree with above note. [name] [name] 06/22/2015

## 2015-06-22 NOTE — Progress Notes (Signed)
Utilization review completed.  

## 2015-06-23 ENCOUNTER — Encounter (HOSPITAL_COMMUNITY): Payer: Self-pay | Admitting: *Deleted

## 2015-06-23 LAB — GLUCOSE, CAPILLARY
Glucose-Capillary: 85 mg/dL (ref 65–99)
Glucose-Capillary: 146 mg/dL — ABNORMAL HIGH (ref 65–99)
Glucose-Capillary: 133 mg/dL — ABNORMAL HIGH (ref 65–99)
Glucose-Capillary: 144 mg/dL — ABNORMAL HIGH (ref 65–99)

## 2015-06-23 MED ORDER — OXYCODONE HCL 5 MG PO TABS
5.0000 mg | ORAL_TABLET | ORAL | Status: DC | PRN
  Administered 2015-06-24 – 2015-06-25 (×2): 5 mg via ORAL
  Filled 2015-06-23 (×2): qty 1

## 2015-06-23 MED ORDER — SORBITOL 70 % PO SOLN: 60.0000 mL | Freq: Once | ORAL | Status: DC

## 2015-06-23 MED ORDER — METFORMIN HCL 500 MG PO TABS
500.0000 mg | ORAL_TABLET | Freq: Two times a day (BID) | ORAL | Status: DC
  Administered 2015-06-23 – 2015-06-25 (×5): 500 mg via ORAL
  Filled 2015-06-23 (×5): qty 1

## 2015-06-23 MED ORDER — LINAGLIPTIN 5 MG PO TABS
5.0000 mg | ORAL_TABLET | Freq: Every day | ORAL | Status: DC
  Administered 2015-06-23 – 2015-06-25 (×3): 5 mg via ORAL
  Filled 2015-06-23 (×3): qty 1

## 2015-06-23 MED ORDER — SORBITOL 70 % SOLN
60.0000 mL | Freq: Once | Status: AC
  Administered 2015-06-23: 60 mL via ORAL
  Filled 2015-06-23: qty 60

## 2015-06-23 NOTE — Progress Notes (Signed)
Pt refused ambulation at this time due to being sleepy and incisional soreness. Pt reports recent pain medication made her groggy. Pt states she will walk 3 times today with nursing staff.  Pt instructed on importance of ambulation for her recovery. Pt verbalized understanding.

## 2015-06-23 NOTE — Progress Notes (Addendum)
      [address]       [place] [address]             [phone]        4 Days Post-Op Procedure(s) (LRB): CORONARY ARTERY BYPASS GRAFTING (CABG) TIMES FOUR USING LEFT INTERNAL MAMMARY, RIGHT SAPHENOUS LEG VEIN AND CRYO SAPHENOUS VEIN (N/A) TRANSESOPHAGEAL ECHOCARDIOGRAM (TEE) (N/A)  Subjective: Patient receiving breathing treatment this am.  Objective: Vital signs in last 24 hours: Temp:  [98.1 F (36.7 C)-98.6 F (37 C)] 98.1 F (36.7 C) (04/08 0532) Pulse Rate:  [72-80] 73 (04/08 0532) Cardiac Rhythm:  [-] Normal sinus rhythm (04/08 0700) Resp:  [18] 18 (04/08 0532) BP: (92-116)/(48-64) 116/64 mmHg (04/08 0532) SpO2:  [93 %-98 %] 97 % (04/08 0532) Weight:  [213 lb 3.2 oz (96.707 kg)] 213 lb 3.2 oz (96.707 kg) (04/08 0532)  Pre op weight 95 kg Current Weight  06/23/15 213 lb 3.2 oz (96.707 kg)       Intake/Output from previous day: 04/07 0701 - 04/08 0700 In: 840 [P.O.:840] Out: 1550 [Urine:1550]   Physical Exam:  Cardiovascular: RRR Pulmonary: Diminished at bases Abdomen: Soft, non tender, bowel sounds present. Extremities: Mild bilateral lower extremity edema. Wounds: Aquacel removed today. Sternal wound is clean and dry. Moisture under breasts so dry gauze placed. Right upper thigh wound is clean and dry  Lab Results: CBC: Recent Labs  06/21/15 0421 06/22/15 0330  WBC 20.4* 19.9*  HGB 11.5* 10.7*  HCT 36.7 34.3*  PLT 188 194   BMET:  Recent Labs  06/21/15 0421 06/22/15 0330  NA 140 140  K 4.1 4.1  CL 103 103  CO2 28 27  GLUCOSE 128* 127*  BUN 12 12  CREATININE 0.75 0.71  CALCIUM 8.8* 8.9    PT/INR:  Lab Results  Component Value Date   INR 1.51* 06/19/2015   INR 1.11 06/14/2015   INR 1.14 06/13/2015   ABG:  INR: Will add last result for INR, ABG once components are confirmed Will add last 4 CBG results once components are confirmed  Assessment/Plan:  1. CV - SR in the 70's. On Lopressor 12.5 mg bid,  Plavix 75 mg daily. 2.  Pulmonary - On 1 liter of oxygen via [place]. Wean to room air as tolerates. On Mucinex 600 mg bid. Encourage incentive spirometer. 3. Volume Overload - On Lasix 40 mg daily. 4.  Acute blood loss anemia - H and H yesterday 10.7 and 34.3 5. DM-CBGs 104/107/85. On Insulin. Pre op HGA1C 8.9. Will restart Metformin and Januvia as taken pre op.Will need close follow up with medical doctor after discharge. 6. Remove EPW  ZIMMERMAN,DONIELLE MPA-C 06/23/2015,8:58 AM  Continue daily Lasix on discharge Mon patient examined and medical record reviewed,agree with above note. Tharon Aquas Trigt III 06/23/2015

## 2015-06-24 LAB — CBC
HCT: 33.6 % — ABNORMAL LOW (ref 36.0–46.0)
Hemoglobin: 10.5 g/dL — ABNORMAL LOW (ref 12.0–15.0)
MCH: 28.2 pg (ref 26.0–34.0)
MCHC: 31.3 g/dL (ref 30.0–36.0)
MCV: 90.3 fL (ref 78.0–100.0)
Platelets: 293 10*3/uL (ref 150–400)
RBC: 3.72 MIL/uL — ABNORMAL LOW (ref 3.87–5.11)
RDW: 15.4 % (ref 11.5–15.5)
WBC: 15.6 10*3/uL — ABNORMAL HIGH (ref 4.0–10.5)

## 2015-06-24 LAB — GLUCOSE, CAPILLARY
Glucose-Capillary: 127 mg/dL — ABNORMAL HIGH (ref 65–99)
Glucose-Capillary: 116 mg/dL — ABNORMAL HIGH (ref 65–99)
Glucose-Capillary: 140 mg/dL — ABNORMAL HIGH (ref 65–99)
Glucose-Capillary: 128 mg/dL — ABNORMAL HIGH (ref 65–99)

## 2015-06-24 MED ORDER — LEVALBUTEROL HCL 1.25 MG/0.5ML IN NEBU: 1.2500 mg | INHALATION_SOLUTION | Freq: Three times a day (TID) | RESPIRATORY_TRACT | Status: DC | PRN

## 2015-06-24 NOTE — Progress Notes (Signed)
Patient ambulated 500 ft on room air with walker. She tolerated it well.

## 2015-06-24 NOTE — Progress Notes (Addendum)
      [address]       [place] [address]             [phone]        5 Days Post-Op Procedure(s) (LRB): CORONARY ARTERY BYPASS GRAFTING (CABG) TIMES FOUR USING LEFT INTERNAL MAMMARY, RIGHT SAPHENOUS LEG VEIN AND CRYO SAPHENOUS VEIN (N/A) TRANSESOPHAGEAL ECHOCARDIOGRAM (TEE) (N/A)  Subjective: Patient with constipation yesterday. Given laxatives and had a good bowel movement.  Objective: Vital signs in last 24 hours: Temp:  [98.2 F (36.8 C)-99.2 F (37.3 C)] 98.6 F (37 C) (04/09 0424) Pulse Rate:  [79-84] 83 (04/09 0424) Cardiac Rhythm:  [-] Normal sinus rhythm (04/09 0700) Resp:  [18-20] 18 (04/09 0424) BP: (122-133)/(49-68) 133/68 mmHg (04/09 0424) SpO2:  [92 %-99 %] 97 % (04/09 0424) Weight:  [209 lb 14.4 oz (95.21 kg)] 209 lb 14.4 oz (95.21 kg) (04/09 0424)  Pre op weight 95 kg Current Weight  06/24/15 209 lb 14.4 oz (95.21 kg)       Intake/Output from previous day: 04/08 0701 - 04/09 0700 In: -  Out: 800 [Urine:800]   Physical Exam:  Cardiovascular: RRR Pulmonary: Slightly diminished at bases Abdomen: Soft, non tender, bowel sounds present. Extremities: Mild bilateral lower extremity edema. Wounds: Sternal wound is clean and dry. Right upper thigh wound is clean and dry  Lab Results: CBC:  Recent Labs  06/22/15 0330 06/24/15 0326  WBC 19.9* 15.6*  HGB 10.7* 10.5*  HCT 34.3* 33.6*  PLT 194 293   BMET:   Recent Labs  06/22/15 0330  NA 140  K 4.1  CL 103  CO2 27  GLUCOSE 127*  BUN 12  CREATININE 0.71  CALCIUM 8.9    PT/INR:  Lab Results  Component Value Date   INR 1.51* 06/19/2015   INR 1.11 06/14/2015   INR 1.14 06/13/2015   ABG:  INR: Will add last result for INR, ABG once components are confirmed Will add last 4 CBG results once components are confirmed  Assessment/Plan:  1. CV - SR in the 70's. On Lopressor 12.5 mg bid, Plavix 75 mg daily. May be able to restart Tribenzor at reduced dose in  am. 2.  Pulmonary - On room air. On Mucinex 600 mg bid. Encourage incentive spirometer. 3. Volume Overload - On Lasix 40 mg daily. 4.  Acute blood loss anemia - H and H stable at 10.5 and 33.6 5. DM-CBGs 133/144/127. On Insulin. Pre op HGA1C 8.9. Will restart Metformin and Januvia as taken pre op.Will need close follow up with medical doctor after discharge. 6. Likely discharge in am  ZIMMERMAN,DONIELLE MPA-C 06/24/2015,8:27 AM   Plan discharge Monday a.m. patient examined and medical record reviewed,agree with above note. Tharon Aquas Trigt III 06/24/2015

## 2015-06-24 NOTE — Progress Notes (Signed)
Patient ambulated 549ft with walker and on room air. She tolerated it well.

## 2015-06-25 ENCOUNTER — Telehealth: Payer: Self-pay | Admitting: Thoracic Surgery (Cardiothoracic Vascular Surgery)

## 2015-06-25 LAB — GLUCOSE, CAPILLARY: Glucose-Capillary: 152 mg/dL — ABNORMAL HIGH (ref 65–99)

## 2015-06-25 MED ORDER — PROMETHAZINE HCL 12.5 MG PO TABS: 12.5000 mg | ORAL_TABLET | Freq: Four times a day (QID) | ORAL | Status: DC | PRN

## 2015-06-25 MED ORDER — OLMESARTAN-AMLODIPINE-HCTZ 20-5-12.5 MG PO TABS: 1.0000 | ORAL_TABLET | Freq: Every day | ORAL | Status: DC

## 2015-06-25 MED ORDER — OXYCODONE HCL 5 MG PO TABS: 5.0000 mg | ORAL_TABLET | ORAL | Status: DC | PRN

## 2015-06-25 MED ORDER — METOPROLOL TARTRATE 25 MG PO TABS: 12.5000 mg | ORAL_TABLET | Freq: Two times a day (BID) | ORAL | Status: DC

## 2015-06-25 MED ORDER — ASPIRIN 81 MG PO TBEC: 81.0000 mg | DELAYED_RELEASE_TABLET | Freq: Every day | ORAL | Status: DC

## 2015-06-25 NOTE — Progress Notes (Addendum)
Orders for discharge received from [facility], [place].  Pt assessment complete and consistent with report received from offgoing nightshift RN. Pt is comfortable and ready for discharge.  Chest tube sutures removed, per Donielle's instructions, lower mid-sternal incisions to remain in place until follow up appt and covered with gauze to keep the moist area dry.  Pt understand the importance of keeping dry gauze in place, sending home with several packs.  IV and telemetry removed, CCMD notified.  Pt son should be here between 9:30-10:00 for pickup.  D/C instructions to be reviewed following unit progression meeting this morning.

## 2015-06-25 NOTE — Discharge Instructions (Signed)
Use dry 2x2 for lower sternal wound. Change dressing daily. Please call office if develops redness, drainage, fever, or chills.  Endoscopic Saphenous Vein Harvesting, Care After Refer to this sheet in the next few weeks. These instructions provide you with information on caring for yourself after your procedure. Your health care provider may also give you more specific instructions. Your treatment has been planned according to current medical practices, but problems sometimes occur. Call your health care provider if you have any problems or questions after your procedure. HOME CARE INSTRUCTIONS Medicine  Take whatever pain medicine your surgeon prescribes. Follow the directions carefully. Do not take over-the-counter pain medicine unless your surgeon says it is okay. Some pain medicine can cause bleeding problems for several weeks after surgery.  Follow your surgeon's instructions about driving. You will probably not be permitted to drive after heart surgery.  Take any medicines your surgeon prescribes. Any medicines you took before your heart surgery should be checked with your health care provider before you start taking them again. Wound care  If your surgeon has prescribed an elastic bandage or stocking, ask how long you should wear it.  Check the area around your surgical cuts (incisions) whenever your bandages (dressings) are changed. Look for any redness or swelling.  You will need to return to have the stitches (sutures) or staples taken out. Ask your surgeon when to do that.  Ask your surgeon when you can shower or bathe. Activity  Try to keep your legs raised when you are sitting.  Do any exercises your health care providers have given you. These may include deep breathing exercises, coughing, walking, or other exercises. SEEK MEDICAL CARE IF:  You have any questions about your medicines.  You have more leg pain, especially if your pain medicine stops working.  New or  growing bruises develop on your leg.  Your leg swells, feels tight, or becomes red.  You have numbness in your leg. SEEK IMMEDIATE MEDICAL CARE IF:  Your pain gets much worse.  Blood or fluid leaks from any of the incisions.  Your incisions become warm, swollen, or red.  You have chest pain.  You have trouble breathing.  You have a fever.  You have more pain near your leg incision. MAKE SURE YOU:  Understand these instructions.  Will watch your condition.  Will get help right away if you are not doing well or get worse.   This information is not intended to replace advice given to you by your health care provider. Make sure you discuss any questions you have with your health care provider.   Document Released: 11/13/2010 Document Revised: 03/24/2014 Document Reviewed: 11/13/2010 Elsevier Interactive Patient Education 2016 Elsevier [employer]. Coronary Artery Bypass Grafting, Care After These instructions give you information on caring for yourself after your procedure. Your doctor may also give you more specific instructions. Call your doctor if you have any problems or questions after your procedure.  HOME CARE  Only take medicine as told by your doctor. Take medicines exactly as told. Do not stop taking medicines or start any new medicines without talking to your doctor first.  Take your pulse as told by your doctor.  Do deep breathing as told by your doctor. Use your breathing device (incentive spirometer), if given, to practice deep breathing several times a day. Support your chest with a pillow or your arms when you take deep breaths or cough.  Keep the area clean, dry, and protected where the surgery cuts (incisions)  were made. Remove bandages (dressings) only as told by your doctor. If strips were applied to surgical area, do not take them off. They fall off on their own.  Check the surgery area daily for puffiness (swelling), redness, or leaking fluid.  If surgery cuts  were made in your legs:  Avoid crossing your legs.  Avoid sitting for long periods of time. Change positions every 30 minutes.  Raise your legs when you are sitting. Place them on pillows.  Wear stockings that help keep blood clots from forming in your legs (compression stockings).  Only take sponge baths until your doctor says it is okay to take showers. Pat the surgery area dry. Do not rub the surgery area with a washcloth or towel. Do not bathe, swim, or use a hot tub until your doctor says it is okay.  Eat foods that are high in fiber. These include raw fruits and vegetables, whole grains, beans, and nuts. Choose lean meats. Avoid canned, processed, and fried foods.  Drink enough fluids to keep your pee (urine) clear or pale yellow.  Weigh yourself every day.  Rest and limit activity as told by your doctor. You may be told to:  Stop any activity if you have chest pain, shortness of breath, changes in heartbeat, or dizziness. Get help right away if this happens.  Move around often for short amounts of time or take short walks as told by your doctor. Gradually become more active. You may need help to strengthen your muscles and build endurance.  Avoid lifting, pushing, or pulling anything heavier than 10 pounds (4.5 kg) for at least 6 weeks after surgery.  Do not drive until your doctor says it is okay.  Ask your doctor when you can go back to work.  Ask your doctor when you can begin sexual activity again.  Follow up with your doctor as told. GET HELP IF:  You have puffiness, redness, more pain, or fluid draining from the incision site.  You have a fever.  You have puffiness in your ankles or legs.  You have pain in your legs.  You gain 2 or more pounds (0.9 kg) a day.  You feel sick to your stomach (nauseous) or throw up (vomit).  You have watery poop (diarrhea). GET HELP RIGHT AWAY IF:  You have chest pain that goes to your jaw or arms.  You have shortness of  breath.  You have a fast or irregular heartbeat.  You notice a "clicking" in your breastbone when you move.  You have numbness or weakness in your arms or legs.  You feel dizzy or light-headed. MAKE SURE YOU:  Understand these instructions.  Will watch your condition.  Will get help right away if you are not doing well or get worse.   This information is not intended to replace advice given to you by your health care provider. Make sure you discuss any questions you have with your health care provider.   Document Released: 03/08/2013 Document Reviewed: 03/08/2013 Elsevier Interactive Patient Education [employer].

## 2015-06-25 NOTE — Progress Notes (Addendum)
      [id number]       [place] [address]             [phone]        6 Days Post-Op Procedure(s) (LRB): CORONARY ARTERY BYPASS GRAFTING (CABG) TIMES FOUR USING LEFT INTERNAL MAMMARY, RIGHT SAPHENOUS LEG VEIN AND CRYO SAPHENOUS VEIN (N/A) TRANSESOPHAGEAL ECHOCARDIOGRAM (TEE) (N/A)  Subjective: Patient looking forward to going home.  Objective: Vital signs in last 24 hours: Temp:  [98.4 F (36.9 C)-98.9 F (37.2 C)] 98.9 F (37.2 C) (04/10 0449) Pulse Rate:  [80-89] 84 (04/10 0449) Cardiac Rhythm:  [-] Normal sinus rhythm (04/09 2026) Resp:  [16-20] 16 (04/10 0449) BP: (132-155)/(69-83) 155/77 mmHg (04/10 0449) SpO2:  [96 %-99 %] 96 % (04/10 0449) Weight:  [208 lb 14.4 oz (94.756 kg)] 208 lb 14.4 oz (94.756 kg) (04/10 0449)  Pre op weight 95 kg Current Weight  06/25/15 208 lb 14.4 oz (94.756 kg)       Intake/Output from previous day: 04/09 0701 - 04/10 0700 In: 720 [P.O.:720] Out: -    Physical Exam:  Cardiovascular: RRR Pulmonary: Slightly diminished at bases Abdomen: Soft, non tender, bowel sounds present. Extremities: Mild bilateral lower extremity edema. Wounds: Sternal wound is clean and dry. Inferior sternal wound has 2 nylon sutures. These will remain until after discharge. No erythema or drainage.Right upper thigh wound is clean and dry   Lab Results: CBC:  Recent Labs  06/24/15 0326  WBC 15.6*  HGB 10.5*  HCT 33.6*  PLT 293   BMET:  No results for input(s): NA, K, CL, CO2, GLUCOSE, BUN, CREATININE, CALCIUM in the last 72 hours.  PT/INR:  Lab Results  Component Value Date   INR 1.51* 06/19/2015   INR 1.11 06/14/2015   INR 1.14 06/13/2015   ABG:  INR: Will add last result for INR, ABG once components are confirmed Will add last 4 CBG results once components are confirmed  Assessment/Plan:  1. CV - SR in the 70's. On Lopressor 12.5 mg bid, Plavix 75 mg daily. Will restart Tribenzor at reduced dose at  discharge. 2.  Pulmonary - On room air. On Mucinex 600 mg bid. Encourage incentive spirometer. 3. Volume Overload - On Lasix 40 mg daily. 4.  Acute blood loss anemia - H and H stable at 10.5 and 33.6 5. DM-CBGs 140/128/152. On Insulin. Pre op HGA1C 8.9. Continue Metformin and Januvia as taken pre op.Will need close follow up with medical doctor after discharge. 6. Patient instructed to use dry 2x2s with tape for bottom of sternal wound. She is to keep area dry and change dressing daily. 7. Discharge  ZIMMERMAN,DONIELLE MPA-C 06/25/2015,7:21 AM

## 2015-06-25 NOTE — Telephone Encounter (Signed)
Discharged today Had requested nausea med but none prescribed Prescription for phenergan placed electronically

## 2015-06-26 ENCOUNTER — Other Ambulatory Visit: Payer: Self-pay | Admitting: *Deleted

## 2015-06-26 NOTE — Patient Outreach (Signed)
Referral received from hospital liaison to initiate transition of care program.  Member admitted to hospital with coronary artery disease, status post coronary artery bypass grafting (CABG).  According to chart, she also has history of lupus, stroke, hypertension, and diabetes.  She was discharged on 4/10.    Call placed to member to start program, this care manager introduces self and purpose of call.  She reports that she is "doing well."  She states that she remains sore, but is not having a lot of pain.  She confirms that she did pick up her prescription for Phenergan for nausea relief.  She denies any ongoing nausea.  She also denies any signs/symptoms of infection, and report she is compliant with her restrictions.  She reports taking all medications as prescribed, and state she has checked her weight and blood pressure today, and will continue with daily monitoring.  She inquires about her Olmesartan-Amlodipine-HCTZ dose, which has been cut in half.  She state she is waiting for the pharmacy to send the new dose in the mail.  Advised to continue the current pills she already has, cutting the pill in half until she has the correct dose.  She verbalizes understanding.  Member confirms follow up appointment with cardiology and cardiac surgeon, states that her son will provide transportation.  She agrees to continue transition of care program, and agrees to initial home visit, scheduled for next week.  She denies the need for an urgent home visit as she lives with her son and daughter in law and state they are "policing me, making sure I do what I'm supposed to."  Contact information provided, encouraged to contact with concerns/questions.  Valente David, BSN, [place] [facility]  [facility] Care Manager [phone]

## 2015-06-30 ENCOUNTER — Encounter: Payer: Self-pay | Admitting: *Deleted

## 2015-07-05 ENCOUNTER — Other Ambulatory Visit: Payer: Self-pay | Admitting: *Deleted

## 2015-07-05 ENCOUNTER — Encounter: Payer: Self-pay | Admitting: *Deleted

## 2015-07-05 NOTE — Patient Outreach (Signed)
[facility]) Care Management   07/05/2015  [name] [name] May 14, 1960 [id number]  [name] [name] is an 55 y.o. female  Subjective:   Member state she has been "doing pretty good."  She state she has been getting better since her discharge, following restriction guidelines.  "I have no choice but to follow orders, my son and daughter in law are going to make sure of that."  She does state she experiences some soreness, but denies chest pain.  She report she has quit smoking and has no desire for another cigarette.  She report taking all medications as prescribed, using [employer] mail order to decrease cost.  Denies any problems with refills.  She state that she has made an appointment with her PCP for tomorrow, her son will take her.  Objective:   Review of Systems  Constitutional: Negative.   HENT: Negative.   Eyes: Negative.   Respiratory: Negative.   Cardiovascular: Negative.   Gastrointestinal: Negative.   Genitourinary: Negative.   Musculoskeletal: Negative.   Skin: Negative.   Neurological: Negative.   Endo/Heme/Allergies: Negative.   Psychiatric/Behavioral: Negative.     Physical Exam  Constitutional: She is oriented to person, place, and time. She appears well-developed and well-nourished.  Neck: Normal range of motion.  Cardiovascular: Normal rate, regular rhythm and normal heart sounds.   Respiratory: Effort normal and breath sounds normal.  GI: Soft. Bowel sounds are normal.  Musculoskeletal: Normal range of motion.  Neurological: She is alert and oriented to person, place, and time.  Skin: Skin is warm and dry.    BP 118/68 mmHg  Pulse 97  Ht 1.676 m (5\' 6" )  Wt 209 lb (94.802 kg)  BMI 33.75 kg/m2  SpO2 96%   Encounter Medications:   Outpatient Encounter Prescriptions as of 07/05/2015  Medication Sig Note  . aspirin EC 81 MG EC tablet Take 1 tablet (81 mg total) by mouth daily.   . Biotin 1 MG CAPS Take 1 mg by mouth daily.   .  Cholecalciferol (VITAMIN D3) 50000 units CAPS Take 50,000 Units by mouth once a week. 06/12/2015: Received from: External Pharmacy Received Sig:   . clopidogrel (PLAVIX) 75 MG tablet Take 75 mg by mouth at bedtime.   . INVOKANA 300 MG TABS tablet Take 300 mg by mouth daily. 06/12/2015: Received from: External Pharmacy Received Sig:   . Iron-Vitamins (GERITOL COMPLETE PO) Take 1 tablet by mouth daily.   Marland Kitchen JANUVIA 100 MG tablet Take 100 mg by mouth daily. 06/12/2015: Received from: External Pharmacy Received Sig:   . metFORMIN (GLUCOPHAGE) 1000 MG tablet Take 1,000 mg by mouth 2 (two) times daily. 06/12/2015: Received from: External Pharmacy Received Sig:   . metoprolol tartrate (LOPRESSOR) 25 MG tablet Take 0.5 tablets (12.5 mg total) by mouth 2 (two) times daily.   . Olmesartan-Amlodipine-HCTZ 20-5-12.5 MG TABS Take 1 tablet by mouth daily.   Marland Kitchen oxyCODONE (OXY IR/ROXICODONE) 5 MG immediate release tablet Take 1 tablet (5 mg total) by mouth every 4 (four) hours as needed for severe pain.   . promethazine (PHENERGAN) 12.5 MG tablet Take 1 tablet (12.5 mg total) by mouth every 6 (six) hours as needed for nausea or vomiting.   . simvastatin (ZOCOR) 40 MG tablet Take 40 mg by mouth daily. 06/12/2015: Received from: External Pharmacy Received Sig:    No facility-administered encounter medications on file as of 07/05/2015.    Functional Status:   In your present state of health, do you have any  difficulty performing the following activities: 07/05/2015 06/12/2015  Hearing? N -  Vision? N -  Difficulty concentrating or making decisions? N -  Walking or climbing stairs? N -  Dressing or bathing? N -  Doing errands, shopping? N N  Preparing Food and eating ? N -  Using the Toilet? N -  In the past six months, have you accidently leaked urine? N -  Do you have problems with loss of bowel control? N -  Managing your Medications? N -  Managing your Finances? N -  Housekeeping or managing your Housekeeping? N  -    Fall/Depression Screening:    PHQ 2/9 Scores 07/05/2015  PHQ - 2 Score 0   Fall Risk  07/05/2015  Falls in the past year? No    Assessment:    Member's mobility is improving.  Her incision is healing with no signs/symptoms of infection.  She denies fever.  She state she has a glucose meter, scale, and blood pressure monitor but has not started her self-monitoring yet.  She state she was trying to readjust to being back at home and will start this week.  Discussed the importance of self-monitoring: blood pressure for hypertension (decrease risk of stroke and heart attack), daily weights (monitor fluid status), and blood sugars (management of diabetes and proper wound healing).  Proper diet also discussed (heart healthy and diabetic).  She verbalizes understanding and state that she is interested in seeing a nutritionist to gain more knowledge on proper food choices.  She will discuss with her PCP tomorrow.    She denies any Education officer, museum or pharmacist needs at this time.  She is provided with [facility] calendar to record readings.  Denies any other concerns at this time.  Contact information provided, encouraged to contact with questions or concerns.  Plan:   Will continue with transition of care calls next week. Will email EMMI education videos on recover of coronary artery bypass grafting and management of diabetes, including diet control.  THN CM Care Plan Problem One        Most Recent Value   Care Plan Problem One  Recent hospitalization    Role Documenting the Problem One  Care Management Coordinator   Care Plan for Problem One  Active   THN Long Term Goal (31-90 days)  Member will not be readmitted to hospital within the next 31 days   THN Long Term Goal Start Date  06/26/15   Interventions for Problem One Long Term Goal  Discussed with member the importance of following discharge instructions, including follow up appointments, medications, diet, to decrease the risk of readmission    THN CM Short Term Goal #1 (0-30 days)  Member will have follow up appointment within the next 2 weeks   THN CM Short Term Goal #1 Start Date  06/26/15   Interventions for Short Term Goal #1  Discussed with member the importance of following discharge instructions, including follow up appointments, medications, diet, to decrease the risk of readmission.  Appointment with cardiology confirmed for 4/25, appointment with PCP confirmed for 4/21, transportation confirmed   THN CM Short Term Goal #2 (0-30 days)  Member will report taking all medications as prescribed over the next 4 weeks   THN CM Short Term Goal #2 Start Date  06/26/15   Interventions for Short Term Goal #2  Discussed with member the importance of following discharge instructions, including follow up appointments, medications, diet, to decrease the risk of readmission.  Medications reviewed,  Valente David, BSN, [place] [facility]  [facility] Care Manager [phone]

## 2015-07-06 ENCOUNTER — Other Ambulatory Visit: Payer: Self-pay | Admitting: *Deleted

## 2015-07-06 DIAGNOSIS — I10 Essential (primary) hypertension: Secondary | ICD-10-CM | POA: Diagnosis not present

## 2015-07-06 DIAGNOSIS — G8918 Other acute postprocedural pain: Secondary | ICD-10-CM

## 2015-07-06 DIAGNOSIS — Z7984 Long term (current) use of oral hypoglycemic drugs: Secondary | ICD-10-CM | POA: Diagnosis not present

## 2015-07-06 DIAGNOSIS — I2510 Atherosclerotic heart disease of native coronary artery without angina pectoris: Secondary | ICD-10-CM | POA: Diagnosis not present

## 2015-07-06 DIAGNOSIS — Z09 Encounter for follow-up examination after completed treatment for conditions other than malignant neoplasm: Secondary | ICD-10-CM | POA: Diagnosis not present

## 2015-07-06 DIAGNOSIS — E1121 Type 2 diabetes mellitus with diabetic nephropathy: Secondary | ICD-10-CM | POA: Diagnosis not present

## 2015-07-06 DIAGNOSIS — E785 Hyperlipidemia, unspecified: Secondary | ICD-10-CM | POA: Diagnosis not present

## 2015-07-06 MED ORDER — OXYCODONE HCL 5 MG PO TABS: 5.0000 mg | ORAL_TABLET | ORAL | Status: DC | PRN

## 2015-07-06 NOTE — Telephone Encounter (Signed)
Ms. Harkless has called requesting a refill for Oxycodone s/p CABG on 06/19/15 and discharged on 06/25/15. I informed her that a new signed script would be available today at our office and she agreed.

## 2015-07-06 NOTE — Telephone Encounter (Signed)
Per message from patient left on the refill voicemail, she would like the oxycodone rx sent to [facility]

## 2015-07-10 ENCOUNTER — Ambulatory Visit (INDEPENDENT_AMBULATORY_CARE_PROVIDER_SITE_OTHER): Payer: Commercial Managed Care - HMO | Admitting: Physician Assistant

## 2015-07-10 ENCOUNTER — Encounter: Payer: Self-pay | Admitting: Physician Assistant

## 2015-07-10 VITALS — BP 120/76 | HR 88 | Ht 66.0 in | Wt 205.0 lb

## 2015-07-10 DIAGNOSIS — L089 Local infection of the skin and subcutaneous tissue, unspecified: Secondary | ICD-10-CM

## 2015-07-10 DIAGNOSIS — T814XXA Infection following a procedure, initial encounter: Secondary | ICD-10-CM

## 2015-07-10 DIAGNOSIS — E669 Obesity, unspecified: Secondary | ICD-10-CM

## 2015-07-10 DIAGNOSIS — E785 Hyperlipidemia, unspecified: Secondary | ICD-10-CM | POA: Diagnosis not present

## 2015-07-10 DIAGNOSIS — I10 Essential (primary) hypertension: Secondary | ICD-10-CM

## 2015-07-10 DIAGNOSIS — IMO0001 Reserved for inherently not codable concepts without codable children: Secondary | ICD-10-CM

## 2015-07-10 DIAGNOSIS — T8141XA Infection following a procedure, superficial incisional surgical site, initial encounter: Secondary | ICD-10-CM | POA: Insufficient documentation

## 2015-07-10 DIAGNOSIS — Z951 Presence of aortocoronary bypass graft: Secondary | ICD-10-CM

## 2015-07-10 DIAGNOSIS — I2510 Atherosclerotic heart disease of native coronary artery without angina pectoris: Secondary | ICD-10-CM

## 2015-07-10 MED ORDER — CEPHALEXIN 500 MG PO CAPS: 500.0000 mg | ORAL_CAPSULE | Freq: Two times a day (BID) | ORAL | Status: DC

## 2015-07-10 NOTE — Patient Instructions (Signed)
Medication Instructions:   START TAKING KEFLEX 500 MG TWICE A DAY FOR 10 DAYS  If you need a refill on your cardiac medications before your next appointment, please call your pharmacy.  Labwork: NONE ORDER TODAY    Testing/Procedures: NONE ORDER TODAY    Follow-Up: WITH DR Martinique IN 3 MONTHS     Any Other Special Instructions Will Be Listed Below (If Applicable).
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
## 2015-07-12 ENCOUNTER — Other Ambulatory Visit: Payer: Self-pay | Admitting: *Deleted

## 2015-07-12 NOTE — Patient Outreach (Signed)
Weekly transition of care call placed to member, no answer, HIPPA compliant voice message left.  Will await call back and continue with weekly calls next week.  Valente David, BSN, [place] Management  [facility] Care Manager [phone]

## 2015-07-12 NOTE — Patient Outreach (Signed)
Call received back from member.  She state that her follow up appointments with her PCP and cardiologist were "good."  She state that her PCP is scheduling her to see a dietitian and reports now being on an antibiotic for an infection at her surgical site on her leg, stating that she bumped it.  She state today that it looks better and is no longer draining.  She denies any signs/symptoms of infection.  She denies any chest pain/discomfort, and reports a follow up visit with the surgeon next week.  She state she has started checking her blood pressure and weight, but have not do so today.  She states "my blood pressure is good and my weight is the same."  She is aware of when to contact the physician.  She does express some sadness about not being able to perform her normal activities.  Reassurance provided, stating that she will continue to improve and will be able to do more soon.  She is aware that cardiac rehab is a recommendation and is looking forward to starting.  Denies any concerns at this time, encouraged to contact with questions.  Valente David, BSN, [facility] Management  [facility] Care Manager [phone]

## 2015-07-17 ENCOUNTER — Other Ambulatory Visit: Payer: Self-pay | Admitting: Cardiothoracic Surgery

## 2015-07-17 DIAGNOSIS — Z951 Presence of aortocoronary bypass graft: Secondary | ICD-10-CM

## 2015-07-18 ENCOUNTER — Ambulatory Visit
Admission: RE | Admit: 2015-07-18 | Discharge: 2015-07-18 | Disposition: A | Discharge status: Home / Self Care | Payer: Commercial Managed Care - HMO | Source: Ambulatory Visit | Attending: Cardiothoracic Surgery | Admitting: Cardiothoracic Surgery

## 2015-07-18 ENCOUNTER — Other Ambulatory Visit: Payer: Self-pay | Admitting: *Deleted

## 2015-07-18 ENCOUNTER — Encounter: Payer: Self-pay | Admitting: Cardiothoracic Surgery

## 2015-07-18 ENCOUNTER — Ambulatory Visit (INDEPENDENT_AMBULATORY_CARE_PROVIDER_SITE_OTHER): Payer: Self-pay | Admitting: Cardiothoracic Surgery

## 2015-07-18 VITALS — BP 123/76 | HR 86 | Resp 16 | Ht 66.0 in | Wt 205.0 lb

## 2015-07-18 DIAGNOSIS — J9811 Atelectasis: Secondary | ICD-10-CM | POA: Diagnosis not present

## 2015-07-18 DIAGNOSIS — Z951 Presence of aortocoronary bypass graft: Secondary | ICD-10-CM

## 2015-07-18 DIAGNOSIS — G8918 Other acute postprocedural pain: Secondary | ICD-10-CM

## 2015-07-18 DIAGNOSIS — I25110 Atherosclerotic heart disease of native coronary artery with unstable angina pectoris: Secondary | ICD-10-CM

## 2015-07-18 MED ORDER — OXYCODONE HCL 5 MG PO TABS: 5.0000 mg | ORAL_TABLET | ORAL | Status: DC | PRN

## 2015-07-18 NOTE — Progress Notes (Signed)
PCP is [name], [name], MD Referring Provider is Martinique, Peter M, MD  Chief Complaint  Patient presents with  . Routine Post Op    s/p CABG X 4..06/19/15 with a CXR    HPI:55 year old returns for one month followup after multivessel CABG. The patient has diabetes and rheumatoid arthritis. The patient had poor saphenous vein conduit and a segment of CryoVein was used for one of the 3 bypasses. She was sent home on Plavix. She's had no recurrent angina or symptoms of CHF. The sternal incision is well-healed. The distal left leg incision has opened and is nonhealing. She is been taking Keflex as prescribed by her primary physician. This currently appears clean but is a defect in the subcutaneous tissue of 2 x 0.5 cm this was cleaned with peroxide and saline, topical silver was applied and a Neosporin dressing.  The patient is interested in starting phase II cardiac rehabilitation and this will be facilitated. The patient can resume driving in light lifting but nothing more than 15 pounds until July. She was given prescription for Plavix refill and her combination antihypertensive refill   Past Medical History  Diagnosis Date  . Hypertension   . Lupus ([place]) dx'd 2008    "they think I have this; have to get retested" (06/12/2015)  . 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 Empty sella ([place])   . Hidradenitis axillaris   . Obesity (BMI 30.0-34.9)   . Ovarian cyst   . Scoliosis   . Hyperlipemia   . Anxiety and depression   . Tobacco abuse   . Kidney stones   . PONV (postoperative nausea and vomiting)   . Anxiety   . Pneumonia X 2  . Type II diabetes mellitus ([place])   . Rheumatoid arthritis ([place])   . Stroke [facility]) March 2014    left MCA branches; "they say I have them all the time", denies residual on 06/12/2015    Past Surgical History  Procedure Laterality Date  . Tonsillectomy  1960s  . Breast biopsy Bilateral   . Dilation and curettage of uterus    . Abdominal hysterectomy     . Tubal ligation    . Cardiac catheterization N/A 06/13/2015    Procedure: Left Heart Cath and Coronary Angiography;  Surgeon: Peter M Martinique, MD;  Location: [place] [facility];  Service: Cardiovascular;  Laterality: N/A;  . Coronary artery bypass graft N/A 06/19/2015    Procedure: CORONARY ARTERY BYPASS GRAFTING (CABG) TIMES FOUR USING LEFT INTERNAL MAMMARY, RIGHT SAPHENOUS LEG VEIN AND CRYO SAPHENOUS VEIN;  Surgeon: [name], MD;  Location: [place];  Service: Open Heart Surgery;  Laterality: N/A;  . Tee without cardioversion N/A 06/19/2015    Procedure: TRANSESOPHAGEAL ECHOCARDIOGRAM (TEE);  Surgeon: [name], MD;  Location: [place];  Service: Open Heart Surgery;  Laterality: N/A;    Family History  Problem Relation Age of Onset  . Diabetes Mother   . Heart failure Mother   . Stroke Maternal Aunt   . Diabetes      maternal  . Breast cancer    . Alzheimer's disease      maternal    Social History Social History  Substance Use Topics  . Smoking status: Former Smoker -- 0.50 packs/day for 36 years    Types: Cigarettes    Quit date: 06/12/2014  . Smokeless tobacco: Never Used  . Alcohol Use: No    Current Outpatient Prescriptions  Medication Sig Dispense Refill  . aspirin EC  81 MG EC tablet Take 1 tablet (81 mg total) by mouth daily.    . cephALEXin (KEFLEX) 500 MG capsule Take 1 capsule (500 mg total) by mouth 2 (two) times daily. 20 capsule 0  . Cholecalciferol (VITAMIN D3) 50000 units CAPS Take 50,000 Units by mouth once a week.    . clopidogrel (PLAVIX) 75 MG tablet Take 75 mg by mouth at bedtime.    . INVOKANA 300 MG TABS tablet Take 300 mg by mouth daily.    . Iron-Vitamins (GERITOL COMPLETE PO) Take 1 tablet by mouth daily.    Marland Kitchen JANUVIA 100 MG tablet Take 100 mg by mouth daily.    . metFORMIN (GLUCOPHAGE) 1000 MG tablet Take 1,000 mg by mouth 2 (two) times daily.    . metoprolol tartrate (LOPRESSOR) 25 MG tablet Take 0.5 tablets (12.5 mg total) by mouth 2 (two)  times daily. 30 tablet 1  . Olmesartan-Amlodipine-HCTZ 20-5-12.5 MG TABS Take 1 tablet by mouth daily. (Patient taking differently: Take 0.5 tablets by mouth daily. ) 30 tablet 1  . promethazine (PHENERGAN) 12.5 MG tablet Take 1 tablet (12.5 mg total) by mouth every 6 (six) hours as needed for nausea or vomiting. 15 tablet 0  . simvastatin (ZOCOR) 40 MG tablet Take 40 mg by mouth daily.    Marland Kitchen oxyCODONE (OXY IR/ROXICODONE) 5 MG immediate release tablet Take 1 tablet (5 mg total) by mouth every 4 (four) hours as needed for severe pain. 40 tablet 0   No current facility-administered medications for this visit.    No Known Allergies  Review of Systems   Minimal pain Improving exercise tolerance and appetite and strength  BP 123/76 mmHg  Pulse 86  Resp 16  Ht 5\' 6"  (1.676 m)  Wt 205 lb (92.987 kg)  BMI 33.10 kg/m2  SpO2 98% Physical Exam Alert and comfortable Lungs clear Sternum stable well-healed Heart rhythm or air without murmur or gallop Right leg incision well-healed Distal left leg incision has opened and has a superficial ulceration which appears clean without cellulitis  Diagnostic Tests: Chest x-rays clear  Impression: Doing well one month postop CABG She'll start cardiac rehabilitation and driving She will treat the superficial ulceration of her left leg with soap and water and Neosporin Band-Aid Return in 10 days for left leg wound check Return in 4 weeks for general review of her recovery     Len Childs, MD [facility] [phone]

## 2015-07-19 ENCOUNTER — Other Ambulatory Visit: Payer: Self-pay | Admitting: *Deleted

## 2015-07-19 NOTE — Patient Outreach (Signed)
Weekly transition of care call placed to member.  She state she is doing "pretty good."  She had a follow up appointment with the cardiac surgeon yesterday, no concerns discussed.  She state that she is healing well, leg incision still healing.  She reports that she has another appointment on 5/15 to assess her leg again.  She state she has been cleared to drive and do some light housework.  She continues to weigh herself (205 pounds today) but again deny checking her blood sugar and blood pressure.  Again discussed the importance of daily self monitoring to manage her diabetes and hypertension, she verbalizes understanding.    Member states that the heart surgeon discussed involvement with cardiac rehab, which will also involve nutrition management.  She denies checking her email for assigned EMMI education.  Encouraged to check email and review material.    She denies any questions or concerns at this time.  Will follow up next week with final transition of care call.  Valente David, BSN, [place] Management  [facility] Care Manager [phone]

## 2015-07-23 ENCOUNTER — Telehealth (HOSPITAL_COMMUNITY): Payer: Self-pay | Admitting: *Deleted

## 2015-07-23 NOTE — Telephone Encounter (Signed)
Returned call from message left by pt.  Pt given CPT [id number]) for contacting [facility] Medicare. Pt to call to verify coverage for cardiac rehab and any financial responsibility.  Pt given contact information for cardiac rehab. Cherre Huger, BSN

## 2015-07-25 ENCOUNTER — Other Ambulatory Visit: Payer: Self-pay | Admitting: *Deleted

## 2015-07-25 DIAGNOSIS — G8918 Other acute postprocedural pain: Secondary | ICD-10-CM

## 2015-07-25 MED ORDER — TRAMADOL HCL 50 MG PO TABS: 50.0000 mg | ORAL_TABLET | Freq: Four times a day (QID) | ORAL | Status: DC | PRN

## 2015-07-25 MED ORDER — TRAMADOL HCL 50 MG PO TABS
50.0000 mg | ORAL_TABLET | Freq: Four times a day (QID) | ORAL | Status: DC | PRN
Start: 2015-07-25 — End: 2015-09-10

## 2015-07-27 ENCOUNTER — Other Ambulatory Visit: Payer: Self-pay | Admitting: *Deleted

## 2015-07-27 ENCOUNTER — Encounter: Payer: Self-pay | Admitting: *Deleted

## 2015-07-27 NOTE — Patient Outreach (Signed)
Weekly transition of care call placed to member.  She state she is "pretty good."  She reports that although she has not started driving again, she has been out more over the past week.  She state she has been a little down some days, especially with this weekend's holiday coming up.  She has not taken the time to begin self-monitoring, stating she has not felt like doing it, "I just been lazy."  With the anniversary of her husband's death approaching, along with other recent stressors, discussed with member the opportunity to seek counseling or therapy.  She denies the need, stating "I know what I need to do, I just need to take my time and deal with it."    Member state she continue to gain her appetite back, and is looking forward to the nutrition/diabetes class that she will begin later this month.  She will begin cardiac rehab next month, but report that she will only be able to attend for 2 weeks, 6 sessions, due to her copay.  She state someone in the department is looking into other options so that she will be able to attend longer.  She also expresses concern regarding her hospital bill, stating that she will apply for indigent funds.  She is a former employee of the Systems developer for [employer] and state she is aware of how to complete the paperwork.  She has follow up appointments with the cardiologist and cardiac surgeon later this month and next month.    Made aware that her transition of care program has been completed, all goals met.  Discussed with member the opportunity of working with a Engineer, maintenance for ongoing education and management of diabetes (A1C 8.9).  She denies the need, stating that her biggest concern was her diet.  She feels that she will have all the education she need once she meet with the dietician and attend the classes.  Encouraged to contact [facility] if she feel she is in need of additional resources after her class.  She verbalizes understanding.  Will notify care  management assistant and PCP of case closure.  Valente David, BSN, [place] Management  [facility] Care Manager [phone]

## 2015-07-30 ENCOUNTER — Ambulatory Visit (INDEPENDENT_AMBULATORY_CARE_PROVIDER_SITE_OTHER): Payer: Self-pay | Admitting: Surgical

## 2015-07-30 VITALS — BP 138/86 | HR 94 | Resp 20 | Ht 66.0 in | Wt 205.0 lb

## 2015-07-30 DIAGNOSIS — T814XXD Infection following a procedure, subsequent encounter: Secondary | ICD-10-CM

## 2015-07-30 DIAGNOSIS — I25110 Atherosclerotic heart disease of native coronary artery with unstable angina pectoris: Secondary | ICD-10-CM

## 2015-07-30 DIAGNOSIS — Z951 Presence of aortocoronary bypass graft: Secondary | ICD-10-CM

## 2015-07-30 DIAGNOSIS — IMO0001 Reserved for inherently not codable concepts without codable children: Secondary | ICD-10-CM

## 2015-07-30 NOTE — Progress Notes (Signed)
[address]       [place] [address]             [phone]                  Lalanya C Durocher [facility] Medical Record [id number] Date of Birth: [birth year]  Referring [id number], Ander Slade, MD Primary Cardiology: Primary Care:[name], [name], MD  Chief Complaint:  Follow Up Visit Status post CABG with left lower extremity EVH site wound infection. History of Present Illness:      The patient is a 55 year old female status post CABG times 3 on 06/19/2015. He underwent bilateral lower extremity attempted vein harvest, however the vein on the left was not usable and was not removed. She did develop a wound infection and has subsequently been treated with oral antibiotics and dressing changes. She is seen on today's date for follow-up.       Zubrod Score: At the time of surgery this patient's most appropriate activity status/level should be described as: []     0    Normal activity, no symptoms []     1    Restricted in physical strenuous activity but ambulatory, able to do out light work []     2    Ambulatory and capable of self care, unable to do work activities, up and about                 >50 % of waking hours                                                                                   []     3    Only limited self care, in bed greater than 50% of waking hours []     4    Completely disabled, no self care, confined to bed or chair []     5    Moribund  History  Smoking status  . Former Smoker -- 0.50 packs/day for 36 years  . Types: Cigarettes  . Quit date: 06/12/2014  Smokeless tobacco  . Never Used       No Known Allergies  Current Outpatient Prescriptions  Medication Sig Dispense Refill  . aspirin EC 81 MG EC tablet Take 1 tablet (81 mg total) by mouth daily.    . Cholecalciferol (VITAMIN D3) 50000 units CAPS Take 50,000 Units by mouth once a week.    . clopidogrel (PLAVIX) 75 MG tablet Take 75 mg by mouth at bedtime.    .  INVOKANA 300 MG TABS tablet Take 300 mg by mouth daily.    . Iron-Vitamins (GERITOL COMPLETE PO) Take 1 tablet by mouth daily.    Marland Kitchen JANUVIA 100 MG tablet Take 100 mg by mouth daily.    . metFORMIN (GLUCOPHAGE) 1000 MG tablet Take 1,000 mg by mouth 2 (two) times daily.    . metoprolol tartrate (LOPRESSOR) 25 MG tablet Take 0.5 tablets (12.5 mg total) by mouth 2 (two) times daily. 30 tablet 1  . Olmesartan-Amlodipine-HCTZ 20-5-12.5 MG TABS Take 1 tablet by mouth daily. (Patient taking differently: Take 0.5 tablets by mouth daily. ) 30 tablet 1  .  oxyCODONE (OXY IR/ROXICODONE) 5 MG immediate release tablet Take 1 tablet (5 mg total) by mouth every 4 (four) hours as needed for severe pain. 40 tablet 0  . promethazine (PHENERGAN) 12.5 MG tablet Take 1 tablet (12.5 mg total) by mouth every 6 (six) hours as needed for nausea or vomiting. 15 tablet 0  . simvastatin (ZOCOR) 40 MG tablet Take 40 mg by mouth daily.    . traMADol (ULTRAM) 50 MG tablet Take 1 tablet (50 mg total) by mouth every 6 (six) hours as needed. 30 tablet 0  . cephALEXin (KEFLEX) 500 MG capsule Take 1 capsule (500 mg total) by mouth 2 (two) times daily. (Patient not taking: Reported on 07/30/2015) 20 capsule 0   No current facility-administered medications for this visit.       Physical Exam: BP 138/86 mmHg  Pulse 94  Resp 20  Ht 5\' 6"  (1.676 m)  Wt 205 lb (92.987 kg)  BMI 33.10 kg/m2  SpO2 98%  Wound: The left lower leg EVH site is examined and shows moderate amount of beefy red granulation tissue. There is no evidence of cellulitis or subcutaneous necrosis/purulence  Diagnostic Studies & Laboratory data:         Recent Radiology Findings: No results found.    I have independently reviewed the above radiology findings and reviewed findings  with the patient.  Recent Labs: Lab Results  Component Value Date   WBC 15.6* 06/24/2015   HGB 10.5* 06/24/2015   HCT 33.6* 06/24/2015   PLT 293 06/24/2015   GLUCOSE 127*  06/22/2015   CHOL 308* 06/12/2015   TRIG 296* 06/12/2015   HDL 52 06/12/2015   LDLCALC 197* 06/12/2015   ALT 21 06/14/2015   AST 23 06/14/2015   NA 140 06/22/2015   K 4.1 06/22/2015   CL 103 06/22/2015   CREATININE 0.71 06/22/2015   BUN 12 06/22/2015   CO2 27 06/22/2015   TSH 0.943 06/13/2015   INR 1.51* 06/19/2015   HGBA1C 8.9* 06/12/2015      Assessment / Plan:  Left lower extremity EVH site infection with superficial dehiscence that is healing well. She does not require antibiotics at this time. We'll change dressing to wet-to-dry saline 2 x 2's twice a day. She is scheduled for follow-up appointment on 531 with Dr. Prescott Gum.        Ireene Ballowe E 07/30/2015 2:03 PM

## 2015-07-30 NOTE — Patient Instructions (Signed)
Changed to normal saline wet to dry dressings,  follow-up as scheduled.

## 2015-08-02 ENCOUNTER — Encounter: Payer: Self-pay | Admitting: *Deleted

## 2015-08-02 NOTE — Progress Notes (Signed)
Patient ID: [name] [name], female   DOB: [birth year], 55 y.o.   MRN: [id number] On 07/19/15 a referral was faxed to [facility] to contact [name] to begin Phase II per the request of Dr. Tharon Aquas Trigt.

## 2015-08-09 ENCOUNTER — Ambulatory Visit: Payer: Commercial Managed Care - HMO | Admitting: Dietician

## 2015-08-15 ENCOUNTER — Encounter: Payer: Self-pay | Admitting: Cardiothoracic Surgery

## 2015-08-22 ENCOUNTER — Ambulatory Visit (INDEPENDENT_AMBULATORY_CARE_PROVIDER_SITE_OTHER): Payer: Self-pay | Admitting: Cardiothoracic Surgery

## 2015-08-22 ENCOUNTER — Encounter: Payer: Self-pay | Admitting: Cardiothoracic Surgery

## 2015-08-22 VITALS — BP 150/90 | HR 90 | Resp 20 | Ht 66.0 in | Wt 205.0 lb

## 2015-08-22 DIAGNOSIS — Z951 Presence of aortocoronary bypass graft: Secondary | ICD-10-CM

## 2015-08-22 DIAGNOSIS — IMO0001 Reserved for inherently not codable concepts without codable children: Secondary | ICD-10-CM

## 2015-08-22 DIAGNOSIS — T814XXD Infection following a procedure, subsequent encounter: Secondary | ICD-10-CM

## 2015-08-22 DIAGNOSIS — I25110 Atherosclerotic heart disease of native coronary artery with unstable angina pectoris: Secondary | ICD-10-CM

## 2015-08-22 NOTE — Progress Notes (Signed)
PCP is [name], [name], MD Referring Provider is Martinique, Rabab Currington M, MD  Chief Complaint  Patient presents with  . Routine Post Op    [id number] office visit for wound check of left leg saphenous vein harvest site which developed skin separation and was treated with local dressing changes for healing by secondary intent. Is now healed with a dry eschar. Patient had CABG over 2 months ago. She is ready to start cardiac rehabilitation and normal activity levels. She knows she should not lift more than 20 pounds until 3 months after surgery. Smoking cessation was emphasized to the patient. Past Medical History  Diagnosis Date  . Hypertension   . Lupus ([place]) dx'd 2008    "they think I have this; have to get retested" (06/12/2015)  . 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 Empty sella ([place])   . Hidradenitis axillaris   . Obesity (BMI 30.0-34.9)   . Ovarian cyst   . Scoliosis   . Hyperlipemia   . Anxiety and depression   . Tobacco abuse   . Kidney stones   . PONV (postoperative nausea and vomiting)   . Anxiety   . Pneumonia X 2  . Type II diabetes mellitus ([place])   . Rheumatoid arthritis ([place])   . Stroke [facility]) March 2014    left MCA branches; "they say I have them all the time", denies residual on 06/12/2015    Past Surgical History  Procedure Laterality Date  . Tonsillectomy  1960s  . Breast biopsy Bilateral   . Dilation and curettage of uterus    . Abdominal hysterectomy    . Tubal ligation    . Cardiac catheterization N/A 06/13/2015    Procedure: Left Heart Cath and Coronary Angiography;  Surgeon: Mckenzi Buonomo M [place]ique, MD;  Location: [facility];  Service: Cardiovascular;  Laterality: N/A;  . Coronary artery bypass graft N/A 06/19/2015    Procedure: CORONARY ARTERY BYPASS GRAFTING (CABG) TIMES FOUR USING LEFT INTERNAL MAMMARY, RIGHT SAPHENOUS LEG VEIN AND CRYO SAPHENOUS VEIN;  Surgeon: [name], MD;  Location: [place];  Service: Open Heart Surgery;  Laterality: N/A;  . Tee  without cardioversion N/A 06/19/2015    Procedure: TRANSESOPHAGEAL ECHOCARDIOGRAM (TEE);  Surgeon: [name], MD;  Location: [place];  Service: Open Heart Surgery;  Laterality: N/A;    Family History  Problem Relation Age of Onset  . Diabetes Mother   . Heart failure Mother   . Stroke Maternal Aunt   . Diabetes      maternal  . Breast cancer    . Alzheimer's disease      maternal    Social History Social History  Substance Use Topics  . Smoking status: Former Smoker -- 0.50 packs/day for 36 years    Types: Cigarettes    Quit date: 06/12/2014  . Smokeless tobacco: Never Used  . Alcohol Use: No    Current Outpatient Prescriptions  Medication Sig Dispense Refill  . aspirin EC 81 MG EC tablet Take 1 tablet (81 mg total) by mouth daily.    . Cholecalciferol (VITAMIN D3) 50000 units CAPS Take 50,000 Units by mouth once a week.    . clopidogrel (PLAVIX) 75 MG tablet Take 75 mg by mouth at bedtime.    . INVOKANA 300 MG TABS tablet Take 300 mg by mouth daily.    . Iron-Vitamins (GERITOL COMPLETE PO) Take 1 tablet by mouth daily.    Marland Kitchen JANUVIA 100 MG tablet Take 100 mg by mouth  daily.    . metFORMIN (GLUCOPHAGE) 1000 MG tablet Take 1,000 mg by mouth 2 (two) times daily.    . metoprolol tartrate (LOPRESSOR) 25 MG tablet Take 0.5 tablets (12.5 mg total) by mouth 2 (two) times daily. 30 tablet 1  . Olmesartan-Amlodipine-HCTZ 20-5-12.5 MG TABS Take 1 tablet by mouth daily. (Patient taking differently: Take 0.5 tablets by mouth daily. ) 30 tablet 1  . promethazine (PHENERGAN) 12.5 MG tablet Take 1 tablet (12.5 mg total) by mouth every 6 (six) hours as needed for nausea or vomiting. 15 tablet 0  . simvastatin (ZOCOR) 40 MG tablet Take 40 mg by mouth daily.    . traMADol (ULTRAM) 50 MG tablet Take 1 tablet (50 mg total) by mouth every 6 (six) hours as needed. 30 tablet 0   No current facility-administered medications for this visit.    No Known Allergies  Review of Systems   No  drainage or pain at the left leg harvest site.  BP 150/90 mmHg  Pulse 90  Resp 20  Ht 5\' 6"  (1.676 m)  Wt 205 lb (92.987 kg)  BMI 33.10 kg/m2  SpO2 98% Physical Exam  Alert and comfortable Breath sounds clear Heart rhythm regular No pedal edema Sternal incision well-healed Left leg incision with healed superficial skin separation  Diagnostic Tests: None  Impression: Leg incision healed Continue with postop recovery, cardiac rehabilitation, healthy diet and regular exercise Recommend long-term Plavix for improved patency of cryopreserved saphenous vein allograft used for a vein bypass graft Plan: Return as needed  Len Childs, MD [facility] [phone]

## 2015-08-28 ENCOUNTER — Encounter (HOSPITAL_COMMUNITY): Payer: Self-pay

## 2015-08-28 ENCOUNTER — Encounter (HOSPITAL_COMMUNITY)
Admission: RE | Admit: 2015-08-28 | Discharge: 2015-08-28 | Disposition: A | Discharge status: Home / Self Care | Payer: Commercial Managed Care - HMO | Source: Ambulatory Visit | Attending: Cardiology | Admitting: Cardiology

## 2015-08-28 VITALS — BP 138/88 | HR 91 | Ht 67.0 in | Wt 211.2 lb

## 2015-08-28 DIAGNOSIS — Z951 Presence of aortocoronary bypass graft: Secondary | ICD-10-CM

## 2015-08-28 DIAGNOSIS — Z79899 Other long term (current) drug therapy: Secondary | ICD-10-CM | POA: Diagnosis not present

## 2015-08-28 DIAGNOSIS — I214 Non-ST elevation (NSTEMI) myocardial infarction: Secondary | ICD-10-CM | POA: Diagnosis not present

## 2015-08-29 NOTE — Progress Notes (Signed)
Cardiac Individual Treatment Plan  Patient Details  Name: [name] [name] MRN: [id number] Date of Birth: [birth year] Referring Provider:        CARDIAC REHAB PHASE II ORIENTATION from 08/28/2015 in [place]   Referring Provider  Doren Custard MD      Initial Encounter Date:       CARDIAC REHAB PHASE II ORIENTATION from 08/28/2015 in [place]   Date  08/28/15   Referring Provider  Doren Custard MD      Visit Diagnosis: S/P CABG (coronary artery bypass graft)  Patient's Home Medications on Admission:  Current outpatient prescriptions:  .  aspirin EC 81 MG EC tablet, Take 1 tablet (81 mg total) by mouth daily., Disp: , Rfl:  .  Cholecalciferol (VITAMIN D3) 50000 units CAPS, Take 50,000 Units by mouth once a week., Disp: , Rfl:  .  clopidogrel (PLAVIX) 75 MG tablet, Take 75 mg by mouth at bedtime., Disp: , Rfl:  .  INVOKANA 300 MG TABS tablet, Take 300 mg by mouth daily., Disp: , Rfl:  .  Iron-Vitamins (GERITOL COMPLETE PO), Take 1 tablet by mouth daily., Disp: , Rfl:  .  JANUVIA 100 MG tablet, Take 100 mg by mouth daily., Disp: , Rfl:  .  metFORMIN (GLUCOPHAGE) 1000 MG tablet, Take 1,000 mg by mouth 2 (two) times daily., Disp: , Rfl:  .  metoprolol tartrate (LOPRESSOR) 25 MG tablet, Take 0.5 tablets (12.5 mg total) by mouth 2 (two) times daily., Disp: 30 tablet, Rfl: 1 .  Olmesartan-Amlodipine-HCTZ 20-5-12.5 MG TABS, Take 1 tablet by mouth daily. (Patient taking differently: Take 0.5 tablets by mouth daily. ), Disp: 30 tablet, Rfl: 1 .  promethazine (PHENERGAN) 12.5 MG tablet, Take 1 tablet (12.5 mg total) by mouth every 6 (six) hours as needed for nausea or vomiting., Disp: 15 tablet, Rfl: 0 .  simvastatin (ZOCOR) 40 MG tablet, Take 40 mg by mouth daily., Disp: , Rfl:  .  traMADol (ULTRAM) 50 MG tablet, Take 1 tablet (50 mg total) by mouth every 6 (six) hours as needed., Disp: 30 tablet, Rfl: 0  Past Medical  History: Past Medical History  Diagnosis Date  . Hypertension   . Lupus ([place]) dx'd 2008    "they think I have this; have to get retested" (06/12/2015)  . 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 Empty sella ([place])   . Hidradenitis axillaris   . Obesity (BMI 30.0-34.9)   . Ovarian cyst   . Scoliosis   . Hyperlipemia   . Anxiety and depression   . Tobacco abuse   . Kidney stones   . PONV (postoperative nausea and vomiting)   . Anxiety   . Pneumonia X 2  . Type II diabetes mellitus ([place])   . Rheumatoid arthritis ([place])   . Stroke [facility]) March 2014    left MCA branches; "they say I have them all the time", denies residual on 06/12/2015    Tobacco Use: History  Smoking status  . Former Smoker -- 0.50 packs/day for 36 years  . Types: Cigarettes  . Quit date: 06/12/2014  Smokeless tobacco  . Never Used    Labs: Recent Review Flowsheet Data    Labs for ITP Cardiac and Pulmonary Rehab Latest Ref Rng 06/19/2015 06/19/2015 06/19/2015 06/20/2015 06/20/2015   PHART 7.350 - 7.450 7.377 7.367 - 7.374 -   PCO2ART 35.0 - 45.0 mmHg 44.7 45.9(H) - 44.0 -   HCO3 20.0 - 24.0 mEq/L 26.3(H)  26.3(H) - 25.4(H) -   TCO2 0 - 100 mmol/L 28 28 22 27 25    O2SAT - 95.0 95.0 - 92.0 -      Capillary Blood Glucose: Lab Results  Component Value Date   GLUCAP 152* 06/25/2015   GLUCAP 128* 06/24/2015   GLUCAP 140* 06/24/2015   GLUCAP 116* 06/24/2015   GLUCAP 127* 06/24/2015     Exercise Target Goals: Date: 08/28/15  Exercise Program Goal: Individual exercise prescription set with THRR, safety & activity barriers. Participant demonstrates ability to understand and report RPE using BORG scale, to self-measure pulse accurately, and to acknowledge the importance of the exercise prescription.  Exercise Prescription Goal: Starting with aerobic activity 30 plus minutes a day, 3 days per week for initial exercise prescription. Provide home exercise prescription and guidelines that participant acknowledges understanding  prior to discharge.  Activity Barriers & Risk Stratification:     Activity Barriers & Cardiac Risk Stratification - 08/28/15 1703    Activity Barriers & Cardiac Risk Stratification   Activity Barriers Arthritis   Cardiac Risk Stratification High      6 Minute Walk:     6 Minute Walk      08/28/15 1705       6 Minute Walk   Phase Initial     Distance 1610 feet     Walk Time 6 minutes     # of Rest Breaks 0     MPH 3.05     METS 4.13     RPE 11     VO2 Peak 14.45     Symptoms Yes (comment)     Comments Bilateral ankle soreness secondary to RA and bone spurs.     Resting HR 91 bpm     Resting BP 138/88 mmHg     Max Ex. HR 114 bpm     Max Ex. BP 144/90 mmHg     2 Minute Post BP 132/90 mmHg        Initial Exercise Prescription:     Initial Exercise Prescription - 08/29/15 0700    Date of Initial Exercise RX and Referring Provider   Date 08/28/15   Referring Provider Doren Custard MD   Treadmill   MPH 2.5   Grade 1   Minutes 10   METs 3.26   Bike   Level 1.2   Minutes 10   METs 3.38   NuStep   Level 3   Minutes 10   METs 2.5   Prescription Details   Frequency (times per week) 3   Duration Progress to 30 minutes of continuous aerobic without signs/symptoms of physical distress   Intensity   THRR 40-80% of Max Heartrate 66-132   Ratings of Perceived Exertion 11-13   Progression   Progression Continue to progress workloads to maintain intensity without signs/symptoms of physical distress.   Resistance Training   Training Prescription Yes   Weight 2 lb   Reps 10-12      Perform Capillary Blood Glucose checks as needed.  Exercise Prescription Changes:   Exercise Comments:   Discharge Exercise Prescription (Final Exercise Prescription Changes):   Nutrition:  Target Goals: Understanding of nutrition guidelines, daily intake of sodium 1500mg , cholesterol 200mg , calories 30% from fat and 7% or less from saturated fats, daily to have 5 or more  servings of fruits and vegetables.  Biometrics:     Pre Biometrics - 08/28/15 1701    Pre Biometrics   Waist Circumference 38.75 inches   Hip Circumference  45.75 inches   Waist to Hip Ratio 0.85 %   Triceps Skinfold 42 mm   % Body Fat 43.9 %   Grip Strength 31.5 kg   Flexibility 7.5 in   Single Leg Stand 4.31 seconds       Nutrition Therapy Plan and Nutrition Goals:   Nutrition Discharge: Nutrition Scores:   Nutrition Goals Re-Evaluation:   Psychosocial: Target Goals: Acknowledge presence or absence of depression, maximize coping skills, provide positive support system. Participant is able to verbalize types and ability to use techniques and skills needed for reducing stress and depression.  Initial Review & Psychosocial Screening:     Initial Psych Review & Screening - 08/28/15 1649    Initial Review   Current issues with History of Depression;Current Anxiety/Panic;Current Stress Concerns   Family Dynamics   Good Support System? No   Concerns No support system;Recent loss of significant other   Comments pt husband passed away one year ago, patient was his caregiver    Barriers   Psychosocial barriers to participate in program The patient should benefit from training in stress management and relaxation.   Screening Interventions   Interventions Encouraged to exercise;Program counselor consult      Quality of Life Scores:   PHQ-9:     Recent Review Flowsheet Data    Depression screen [facility] 2/9 07/05/2015   Decreased Interest 0   Down, Depressed, Hopeless 0   PHQ - 2 Score 0      Psychosocial Evaluation and Intervention:   Psychosocial Re-Evaluation:   Vocational Rehabilitation: Provide vocational rehab assistance to qualifying candidates.   Vocational Rehab Evaluation & Intervention:     Vocational Rehab - 08/28/15 1650    Initial Vocational Rehab Evaluation & Intervention   Assessment shows need for Vocational Rehabilitation No       Education: Education Goals: Education classes will be provided on a weekly basis, covering required topics. Participant will state understanding/return demonstration of topics presented.  Learning Barriers/Preferences:     Learning Barriers/Preferences - 08/28/15 1644    Learning Barriers/Preferences   Learning Barriers Sight   Learning Preferences Skilled Demonstration;Verbal Instruction      Education Topics: Count Your Pulse:  -Group instruction provided by verbal instruction, demonstration, patient participation and written materials to support subject.  Instructors address importance of being able to find your pulse and how to count your pulse when at home without a heart monitor.  Patients get hands on experience counting their pulse with staff help and individually.   Heart Attack, Angina, and Risk Factor Modification:  -Group instruction provided by verbal instruction, video, and written materials to support subject.  Instructors address signs and symptoms of angina and heart attacks.    Also discuss risk factors for heart disease and how to make changes to improve heart health risk factors.   Functional Fitness:  -Group instruction provided by verbal instruction, demonstration, patient participation, and written materials to support subject.  Instructors address safety measures for doing things around the house.  Discuss how to get up and down off the floor, how to pick things up properly, how to safely get out of a chair without assistance, and balance training.   Meditation and Mindfulness:  -Group instruction provided by verbal instruction, patient participation, and written materials to support subject.  Instructor addresses importance of mindfulness and meditation practice to help reduce stress and improve awareness.  Instructor also leads participants through a meditation exercise.    Stretching for Flexibility and Mobility:  -  Group instruction provided by verbal  instruction, patient participation, and written materials to support subject.  Instructors lead participants through series of stretches that are designed to increase flexibility thus improving mobility.  These stretches are additional exercise for major muscle groups that are typically performed during regular warm up and cool down.   Hands Only CPR Anytime:  -Group instruction provided by verbal instruction, video, patient participation and written materials to support subject.  Instructors co-teach with AHA video for hands only CPR.  Participants get hands on experience with mannequins.   Nutrition I class: Heart Healthy Eating:  -Group instruction provided by PowerPoint slides, verbal discussion, and written materials to support subject matter. The instructor gives an explanation and review of the Therapeutic Lifestyle Changes diet recommendations, which includes a discussion on lipid goals, dietary fat, sodium, fiber, plant stanol/sterol esters, sugar, and the components of a well-balanced, healthy diet.   Nutrition II class: Lifestyle Skills:  -Group instruction provided by PowerPoint slides, verbal discussion, and written materials to support subject matter. The instructor gives an explanation and review of label reading, grocery shopping for heart health, heart healthy recipe modifications, and ways to make healthier choices when eating out.   Diabetes Question & Answer:  -Group instruction provided by PowerPoint slides, verbal discussion, and written materials to support subject matter. The instructor gives an explanation and review of diabetes co-morbidities, pre- and post-prandial blood glucose goals, pre-exercise blood glucose goals, signs, symptoms, and treatment of hypoglycemia and hyperglycemia, and foot care basics.   Diabetes Blitz:  -Group instruction provided by PowerPoint slides, verbal discussion, and written materials to support subject matter. The instructor gives an  explanation and review of the physiology behind type 1 and type 2 diabetes, diabetes medications and rational behind using different medications, pre- and post-prandial blood glucose recommendations and Hemoglobin A1c goals, diabetes diet, and exercise including blood glucose guidelines for exercising safely.    Portion Distortion:  -Group instruction provided by PowerPoint slides, verbal discussion, written materials, and food models to support subject matter. The instructor gives an explanation of serving size versus portion size, changes in portions sizes over the last 20 years, and what consists of a serving from each food group.   Stress Management:  -Group instruction provided by verbal instruction, video, and written materials to support subject matter.  Instructors review role of stress in heart disease and how to cope with stress positively.     Exercising on Your Own:  -Group instruction provided by verbal instruction, power point, and written materials to support subject.  Instructors discuss benefits of exercise, components of exercise, frequency and intensity of exercise, and end points for exercise.  Also discuss use of nitroglycerin and activating EMS.  Review options of places to exercise outside of rehab.  Review guidelines for sex with heart disease.   Cardiac Drugs I:  -Group instruction provided by verbal instruction and written materials to support subject.  Instructor reviews cardiac drug classes: antiplatelets, anticoagulants, beta blockers, and statins.  Instructor discusses reasons, side effects, and lifestyle considerations for each drug class.   Cardiac Drugs II:  -Group instruction provided by verbal instruction and written materials to support subject.  Instructor reviews cardiac drug classes: angiotensin converting enzyme inhibitors (ACE-I), angiotensin II receptor blockers (ARBs), nitrates, and calcium channel blockers.  Instructor discusses reasons, side effects,  and lifestyle considerations for each drug class.   Anatomy and Physiology of the Circulatory System:  -Group instruction provided by verbal instruction, video, and written materials to  support subject.  Reviews functional anatomy of heart, how it relates to various diagnoses, and what role the heart plays in the overall system.   Knowledge Questionnaire Score:   Core Components/Risk Factors/Patient Goals at Admission:     Personal Goals and Risk Factors at Admission - 08/28/15 1645    Core Components/Risk Factors/Patient Goals on Admission    Weight Management Obesity;Yes   Intervention Obesity: Provide education and appropriate resources to help participant work on and attain dietary goals.;Weight Management/Obesity: Establish reasonable short term and long term weight goals.   Admit Weight 211 lb 3.2 oz (95.8 kg)   Expected Outcomes Short Term: Continue to assess and modify interventions until short term weight is achieved;Long Term: Adherence to nutrition and physical activity/exercise program aimed toward attainment of established weight goal   Sedentary Yes   Intervention Provide advice, education, support and counseling about physical activity/exercise needs.;Develop an individualized exercise prescription for aerobic and resistive training based on initial evaluation findings, risk stratification, comorbidities and participant's personal goals.   Expected Outcomes Achievement of increased cardiorespiratory fitness and enhanced flexibility, muscular endurance and strength shown through measurements of functional capacity and personal statement of participant.   Increase Strength and Stamina Yes   Intervention Provide advice, education, support and counseling about physical activity/exercise needs.;Develop an individualized exercise prescription for aerobic and resistive training based on initial evaluation findings, risk stratification, comorbidities and participant's personal goals.    Expected Outcomes Achievement of increased cardiorespiratory fitness and enhanced flexibility, muscular endurance and strength shown through measurements of functional capacity and personal statement of participant.   Tobacco Cessation Yes   Intervention Assist the participant in steps to quit. Provide individualized education and counseling about committing to Tobacco Cessation, relapse prevention, and pharmacological support that can be provided by physician.;Advice worker, assist with locating and accessing local/national Quit Smoking programs, and support quit date choice.   Expected Outcomes Short Term: Will demonstrate readiness to quit, by selecting a quit date.   Diabetes Yes   Intervention Provide education about proper nutrition, including hydration, and aerobic/resistive exercise prescription along with prescribed medications to achieve blood glucose in normal ranges: Fasting glucose 65-99 mg/dL;Provide education about signs/symptoms and action to take for hypo/hyperglycemia.   Expected Outcomes Short Term: Participant verbalizes understanding of the signs/symptoms and immediate care of hyper/hypoglycemia, proper foot care and importance of medication, aerobic/resistive exercise and nutrition plan for blood glucose control.   Hypertension Yes   Intervention Provide education on lifestyle modifcations including regular physical activity/exercise, weight management, moderate sodium restriction and increased consumption of fresh fruit, vegetables, and low fat dairy, alcohol moderation, and smoking cessation.;Monitor prescription use compliance.   Expected Outcomes Short Term: Continued assessment and intervention until BP is < 140/72mm HG in hypertensive participants. < 130/74mm HG in hypertensive participants with diabetes, heart failure or chronic kidney disease.   Lipids Yes   Intervention Provide education and support for participant on nutrition & aerobic/resistive exercise  along with prescribed medications to achieve LDL 70mg , HDL >40mg .   Expected Outcomes Short Term: Participant states understanding of desired cholesterol values and is compliant with medications prescribed. Participant is following exercise prescription and nutrition guidelines.   Stress Yes   Intervention Offer individual and/or small group education and counseling on adjustment to heart disease, stress management and health-related lifestyle change. Teach and support self-help strategies.;Refer participants experiencing significant psychosocial distress to appropriate mental health specialists for further evaluation and treatment. When possible, include family members and significant others in education/counseling sessions.  Expected Outcomes Short Term: Participant demonstrates changes in health-related behavior, relaxation and other stress management skills, ability to obtain effective social support, and compliance with psychotropic medications if prescribed.;Long Term: Emotional wellbeing is indicated by absence of clinically significant psychosocial distress or social isolation.      Core Components/Risk Factors/Patient Goals Review:    Core Components/Risk Factors/Patient Goals at Discharge (Final Review):    ITP Comments:     ITP Comments      08/28/15 1642           ITP Comments Dr. Fransico Him, MD Medical Director           Comments: Patient attended orientation from 1330 to 1530 to review rules and guidelines for program. Completed 6 minute walk test, Intitial ITP, and exercise prescription.  VSS. Telemetry-sinus rhythm, occasional PAC.  Asymptomatic.

## 2015-09-03 ENCOUNTER — Encounter (HOSPITAL_COMMUNITY): Payer: Self-pay

## 2015-09-03 ENCOUNTER — Encounter (HOSPITAL_COMMUNITY)
Admission: RE | Admit: 2015-09-03 | Discharge: 2015-09-03 | Disposition: A | Discharge status: Home / Self Care | Payer: Commercial Managed Care - HMO | Source: Ambulatory Visit | Attending: Cardiology | Admitting: Cardiology

## 2015-09-03 DIAGNOSIS — I214 Non-ST elevation (NSTEMI) myocardial infarction: Secondary | ICD-10-CM | POA: Diagnosis not present

## 2015-09-03 DIAGNOSIS — Z951 Presence of aortocoronary bypass graft: Secondary | ICD-10-CM

## 2015-09-03 DIAGNOSIS — Z79899 Other long term (current) drug therapy: Secondary | ICD-10-CM | POA: Diagnosis not present

## 2015-09-03 LAB — GLUCOSE, CAPILLARY
Glucose-Capillary: 95 mg/dL (ref 65–99)
Glucose-Capillary: 105 mg/dL — ABNORMAL HIGH (ref 65–99)

## 2015-09-03 NOTE — Progress Notes (Signed)
Daily Session Note  Patient Details  Name: [name] [name] MRN: [id number] Date of Birth: 03-23-1960 Referring Provider:        CARDIAC REHAB PHASE II ORIENTATION from 08/28/2015 in [place]   Referring Provider  Doren Custard MD      Encounter Date: 09/03/2015  Check In:     Session Check In - 09/03/15 1418    Check-In   Location [facility]   Staff Present [name], RN, BSN;Amber Fair, MS, ACSM RCEP, Exercise Physiologist;Maria Whitaker, RN, BSN   Supervising physician immediately available to respond to emergencies [facility] immediately available   Physician(s) Dr. Marily Memos    Medication changes reported     No   Fall or balance concerns reported    No   Warm-up and Cool-down Performed as group-led instruction   Resistance Training Performed Yes   VAD Patient? No   Pain Assessment   Currently in Pain? No/denies      Capillary Blood Glucose: Results for orders placed or performed during the hospital encounter of 09/03/15 (from the past 24 hour(s))  Glucose, capillary     Status: None   Collection Time: 09/03/15  2:07 PM  Result Value Ref Range   Glucose-Capillary 95 65 - 99 mg/dL     Goals Met:  Exercise tolerated well  Goals Unmet:  Not Applicable  Comments: Pt started cardiac rehab today.  Pt tolerated light exercise without difficulty. VSS, telemetry-sinus rhythm, asymptomatic.  Medication list reconciled. Pt denies barriers to medicaiton compliance.  PSYCHOSOCIAL ASSESSMENT:  PHQ-1. Pt exhibits positive coping skills, hopeful outlook with supportive family.  Pt husband passed away one year ago.  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  Pt does have grandchildren in her home.  No psychosocial needs identified at this time, no psychosocial interventions necessary.    Pt enjoys playing crossword puzzles, reading and is planning to work on a  genealogy project or pursue continuing her education.  .   Pt oriented to exercise equipment and routine.    Understanding verbalized.    Dr. Fransico Him is Medical Director for Cardiac Rehab at [facility].

## 2015-09-05 ENCOUNTER — Encounter (HOSPITAL_COMMUNITY)
Admission: RE | Admit: 2015-09-05 | Discharge: 2015-09-05 | Disposition: A | Discharge status: Home / Self Care | Payer: Commercial Managed Care - HMO | Source: Ambulatory Visit | Attending: Cardiology | Admitting: Cardiology

## 2015-09-05 DIAGNOSIS — Z951 Presence of aortocoronary bypass graft: Secondary | ICD-10-CM

## 2015-09-07 ENCOUNTER — Encounter (HOSPITAL_COMMUNITY)
Admission: RE | Admit: 2015-09-07 | Discharge: 2015-09-07 | Disposition: A | Discharge status: Home / Self Care | Payer: Commercial Managed Care - HMO | Source: Ambulatory Visit | Attending: Cardiology | Admitting: Cardiology

## 2015-09-07 DIAGNOSIS — I214 Non-ST elevation (NSTEMI) myocardial infarction: Secondary | ICD-10-CM | POA: Diagnosis not present

## 2015-09-07 DIAGNOSIS — Z951 Presence of aortocoronary bypass graft: Secondary | ICD-10-CM | POA: Diagnosis not present

## 2015-09-07 DIAGNOSIS — Z79899 Other long term (current) drug therapy: Secondary | ICD-10-CM | POA: Diagnosis not present

## 2015-09-07 NOTE — Progress Notes (Signed)
(  late entry for 09/05/15 1330)  Pt arrived at cardiac rehab tearful, c/o stress, anxiety and duress from her family situation. Pt was upset with her adult son's behavior and attitude.  Apparently pt works nights, sleeps during the day and needed to rest before he was able to provide transportation for pt to visit the housing office. Pt offered emotional support and reassurance. Pt did not exercise.  Pt given snack.  Pt offered counseling appt with Jeanella Craze. Pt declined at this time. Will continue to monitor and provide stress management education.

## 2015-09-07 NOTE — Progress Notes (Signed)
Cardiac Individual Treatment Plan  Patient Details  Name: [name] [name] MRN: [id number] Date of Birth: 19-Apr-1960 Referring Provider:        CARDIAC REHAB PHASE II ORIENTATION from 08/28/2015 in [place]   Referring Provider  Doren Custard MD      Initial Encounter Date:       CARDIAC REHAB PHASE II ORIENTATION from 08/28/2015 in [place]   Date  08/28/15   Referring Provider  Doren Custard MD      Visit Diagnosis: No diagnosis found.  Patient's Home Medications on Admission:  Current outpatient prescriptions:  .  aspirin EC 81 MG EC tablet, Take 1 tablet (81 mg total) by mouth daily., Disp: , Rfl:  .  Cholecalciferol (VITAMIN D3) 50000 units CAPS, Take 50,000 Units by mouth once a week., Disp: , Rfl:  .  clopidogrel (PLAVIX) 75 MG tablet, Take 75 mg by mouth at bedtime., Disp: , Rfl:  .  INVOKANA 300 MG TABS tablet, Take 300 mg by mouth daily., Disp: , Rfl:  .  Iron-Vitamins (GERITOL COMPLETE PO), Take 1 tablet by mouth daily., Disp: , Rfl:  .  JANUVIA 100 MG tablet, Take 100 mg by mouth daily., Disp: , Rfl:  .  metFORMIN (GLUCOPHAGE) 1000 MG tablet, Take 1,000 mg by mouth 2 (two) times daily., Disp: , Rfl:  .  metoprolol tartrate (LOPRESSOR) 25 MG tablet, Take 0.5 tablets (12.5 mg total) by mouth 2 (two) times daily., Disp: 30 tablet, Rfl: 1 .  Olmesartan-Amlodipine-HCTZ 20-5-12.5 MG TABS, Take 1 tablet by mouth daily. (Patient taking differently: Take 0.5 tablets by mouth daily. ), Disp: 30 tablet, Rfl: 1 .  promethazine (PHENERGAN) 12.5 MG tablet, Take 1 tablet (12.5 mg total) by mouth every 6 (six) hours as needed for nausea or vomiting., Disp: 15 tablet, Rfl: 0 .  simvastatin (ZOCOR) 40 MG tablet, Take 40 mg by mouth daily., Disp: , Rfl:  .  traMADol (ULTRAM) 50 MG tablet, Take 1 tablet (50 mg total) by mouth every 6 (six) hours as needed., Disp: 30 tablet, Rfl: 0  Past Medical History: Past Medical History   Diagnosis Date  . Hypertension   . Lupus ([place]) dx'd 2008    "they think I have this; have to get retested" (06/12/2015)  . 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 Empty sella ([place])   . Hidradenitis axillaris   . Obesity (BMI 30.0-34.9)   . Ovarian cyst   . Scoliosis   . Hyperlipemia   . Anxiety and depression   . Tobacco abuse   . Kidney stones   . PONV (postoperative nausea and vomiting)   . Anxiety   . Pneumonia X 2  . Type II diabetes mellitus ([place])   . Rheumatoid arthritis ([place])   . Stroke [facility]) March 2014    left MCA branches; "they say I have them all the time", denies residual on 06/12/2015    Tobacco Use: History  Smoking status  . Former Smoker -- 0.50 packs/day for 36 years  . Types: Cigarettes  . Quit date: 06/12/2014  Smokeless tobacco  . Never Used    Labs: Recent Review Flowsheet Data    Labs for ITP Cardiac and Pulmonary Rehab Latest Ref Rng 06/19/2015 06/19/2015 06/19/2015 06/20/2015 06/20/2015   PHART 7.350 - 7.450 7.377 7.367 - 7.374 -   PCO2ART 35.0 - 45.0 mmHg 44.7 45.9(H) - 44.0 -   HCO3 20.0 - 24.0 mEq/L 26.3(H) 26.3(H) - 25.4(H) -  TCO2 0 - 100 mmol/L 28 28 22 27 25    O2SAT - 95.0 95.0 - 92.0 -      Capillary Blood Glucose: Lab Results  Component Value Date   GLUCAP 95 09/03/2015   GLUCAP 105* 09/03/2015   GLUCAP 152* 06/25/2015   GLUCAP 128* 06/24/2015   GLUCAP 140* 06/24/2015     Exercise Target Goals:    Exercise Program Goal: Individual exercise prescription set with THRR, safety & activity barriers. Participant demonstrates ability to understand and report RPE using BORG scale, to self-measure pulse accurately, and to acknowledge the importance of the exercise prescription.  Exercise Prescription Goal: Starting with aerobic activity 30 plus minutes a day, 3 days per week for initial exercise prescription. Provide home exercise prescription and guidelines that participant acknowledges understanding prior to discharge.  Activity Barriers & Risk  Stratification:     Activity Barriers & Cardiac Risk Stratification - 08/28/15 1703    Activity Barriers & Cardiac Risk Stratification   Activity Barriers Arthritis   Cardiac Risk Stratification High      6 Minute Walk:     6 Minute Walk      08/28/15 1705       6 Minute Walk   Phase Initial     Distance 1610 feet     Walk Time 6 minutes     # of Rest Breaks 0     MPH 3.05     METS 4.13     RPE 11     VO2 Peak 14.45     Symptoms Yes (comment)     Comments Bilateral ankle soreness secondary to RA and bone spurs.     Resting HR 91 bpm     Resting BP 138/88 mmHg     Max Ex. HR 114 bpm     Max Ex. BP 144/90 mmHg     2 Minute Post BP 132/90 mmHg        Initial Exercise Prescription:     Initial Exercise Prescription - 08/29/15 0700    Date of Initial Exercise RX and Referring Provider   Date 08/28/15   Referring Provider Doren Custard MD   Treadmill   MPH 2.5   Grade 1   Minutes 10   METs 3.26   Bike   Level 1.2   Minutes 10   METs 3.38   NuStep   Level 3   Minutes 10   METs 2.5   Prescription Details   Frequency (times per week) 3   Duration Progress to 30 minutes of continuous aerobic without signs/symptoms of physical distress   Intensity   THRR 40-80% of Max Heartrate 66-132   Ratings of Perceived Exertion 11-13   Progression   Progression Continue to progress workloads to maintain intensity without signs/symptoms of physical distress.   Resistance Training   Training Prescription Yes   Weight 2 lb   Reps 10-12      Perform Capillary Blood Glucose checks as needed.  Exercise Prescription Changes:   Exercise Comments:   Discharge Exercise Prescription (Final Exercise Prescription Changes):   Nutrition:  Target Goals: Understanding of nutrition guidelines, daily intake of sodium 1500mg , cholesterol 200mg , calories 30% from fat and 7% or less from saturated fats, daily to have 5 or more servings of fruits and vegetables.  Biometrics:      Pre Biometrics - 08/28/15 1701    Pre Biometrics   Waist Circumference 38.75 inches   Hip Circumference 45.75 inches   Waist to  Hip Ratio 0.85 %   Triceps Skinfold 42 mm   % Body Fat 43.9 %   Grip Strength 31.5 kg   Flexibility 7.5 in   Single Leg Stand 4.31 seconds       Nutrition Therapy Plan and Nutrition Goals:     Nutrition Therapy & Goals - 09/03/15 1017    Nutrition Therapy   Diet Diabetic, Therapeutic Lifestyle Changes   Personal Nutrition Goals   Personal Goal #1 0.5-2 lb wt loss/week to a goal wt loss of 6-24 lb at graduation from Cardiac Rehab   Personal Goal #2 Improved glycemic control as evidenced by a decrease in HbA1c from 8.9 to a goal of 7.0 or less   Intervention Plan   Intervention Prescribe, educate and counsel regarding individualized specific dietary modifications aiming towards targeted core components such as weight, hypertension, lipid management, diabetes, heart failure and other comorbidities.   Expected Outcomes Short Term Goal: Understand basic principles of dietary content, such as calories, fat, sodium, cholesterol and nutrients.;Long Term Goal: Adherence to prescribed nutrition plan.      Nutrition Discharge: Nutrition Scores:     Nutrition Assessments - 09/03/15 1016    MEDFICTS Scores   Pre Score --      Nutrition Goals Re-Evaluation:   Psychosocial: Target Goals: Acknowledge presence or absence of depression, maximize coping skills, provide positive support system. Participant is able to verbalize types and ability to use techniques and skills needed for reducing stress and depression.  Initial Review & Psychosocial Screening:     Initial Psych Review & Screening - 08/28/15 1649    Initial Review   Current issues with History of Depression;Current Anxiety/Panic;Current Stress Concerns   Family Dynamics   Good Support System? No   Concerns No support system;Recent loss of significant other   Comments pt husband passed away  one year ago, patient was his caregiver    Barriers   Psychosocial barriers to participate in program The patient should benefit from training in stress management and relaxation.   Screening Interventions   Interventions Encouraged to exercise;Program counselor consult      Quality of Life Scores:   PHQ-9:     Recent Review Flowsheet Data    Depression screen [facility] 2/9 09/03/2015 07/05/2015   Decreased Interest 0 0   Down, Depressed, Hopeless 1 0   PHQ - 2 Score 1 0      Psychosocial Evaluation and Intervention:   Psychosocial Re-Evaluation:   Vocational Rehabilitation: Provide vocational rehab assistance to qualifying candidates.   Vocational Rehab Evaluation & Intervention:     Vocational Rehab - 08/28/15 1650    Initial Vocational Rehab Evaluation & Intervention   Assessment shows need for Vocational Rehabilitation No      Education: Education Goals: Education classes will be provided on a weekly basis, covering required topics. Participant will state understanding/return demonstration of topics presented.  Learning Barriers/Preferences:     Learning Barriers/Preferences - 08/28/15 1644    Learning Barriers/Preferences   Learning Barriers Sight   Learning Preferences Skilled Demonstration;Verbal Instruction      Education Topics: Count Your Pulse:  -Group instruction provided by verbal instruction, demonstration, patient participation and written materials to support subject.  Instructors address importance of being able to find your pulse and how to count your pulse when at home without a heart monitor.  Patients get hands on experience counting their pulse with staff help and individually.   Heart Attack, Angina, and Risk Factor Modification:  -Group instruction  provided by verbal instruction, video, and written materials to support subject.  Instructors address signs and symptoms of angina and heart attacks.    Also discuss risk factors for heart disease  and how to make changes to improve heart health risk factors.   Functional Fitness:  -Group instruction provided by verbal instruction, demonstration, patient participation, and written materials to support subject.  Instructors address safety measures for doing things around the house.  Discuss how to get up and down off the floor, how to pick things up properly, how to safely get out of a chair without assistance, and balance training.   Meditation and Mindfulness:  -Group instruction provided by verbal instruction, patient participation, and written materials to support subject.  Instructor addresses importance of mindfulness and meditation practice to help reduce stress and improve awareness.  Instructor also leads participants through a meditation exercise.    Stretching for Flexibility and Mobility:  -Group instruction provided by verbal instruction, patient participation, and written materials to support subject.  Instructors lead participants through series of stretches that are designed to increase flexibility thus improving mobility.  These stretches are additional exercise for major muscle groups that are typically performed during regular warm up and cool down.          CARDIAC REHAB PHASE II EXERCISE from 09/05/2015 in [place]   Date  09/05/15   Educator  Luetta Nutting Iline Oven   Instruction Review Code  2- meets goals/outcomes      Hands Only CPR Anytime:  -Group instruction provided by verbal instruction, video, patient participation and written materials to support subject.  Instructors co-teach with AHA video for hands only CPR.  Participants get hands on experience with mannequins.   Nutrition I class: Heart Healthy Eating:  -Group instruction provided by PowerPoint slides, verbal discussion, and written materials to support subject matter. The instructor gives an explanation and review of the Therapeutic Lifestyle Changes diet  recommendations, which includes a discussion on lipid goals, dietary fat, sodium, fiber, plant stanol/sterol esters, sugar, and the components of a well-balanced, healthy diet.   Nutrition II class: Lifestyle Skills:  -Group instruction provided by PowerPoint slides, verbal discussion, and written materials to support subject matter. The instructor gives an explanation and review of label reading, grocery shopping for heart health, heart healthy recipe modifications, and ways to make healthier choices when eating out.   Diabetes Question & Answer:  -Group instruction provided by PowerPoint slides, verbal discussion, and written materials to support subject matter. The instructor gives an explanation and review of diabetes co-morbidities, pre- and post-prandial blood glucose goals, pre-exercise blood glucose goals, signs, symptoms, and treatment of hypoglycemia and hyperglycemia, and foot care basics.   Diabetes Blitz:  -Group instruction provided by PowerPoint slides, verbal discussion, and written materials to support subject matter. The instructor gives an explanation and review of the physiology behind type 1 and type 2 diabetes, diabetes medications and rational behind using different medications, pre- and post-prandial blood glucose recommendations and Hemoglobin A1c goals, diabetes diet, and exercise including blood glucose guidelines for exercising safely.    Portion Distortion:  -Group instruction provided by PowerPoint slides, verbal discussion, written materials, and food models to support subject matter. The instructor gives an explanation of serving size versus portion size, changes in portions sizes over the last 20 years, and what consists of a serving from each food group.   Stress Management:  -Group instruction provided by verbal instruction, video, and written materials to  support subject matter.  Instructors review role of stress in heart disease and how to cope with stress  positively.     Exercising on Your Own:  -Group instruction provided by verbal instruction, power point, and written materials to support subject.  Instructors discuss benefits of exercise, components of exercise, frequency and intensity of exercise, and end points for exercise.  Also discuss use of nitroglycerin and activating EMS.  Review options of places to exercise outside of rehab.  Review guidelines for sex with heart disease.   Cardiac Drugs I:  -Group instruction provided by verbal instruction and written materials to support subject.  Instructor reviews cardiac drug classes: antiplatelets, anticoagulants, beta blockers, and statins.  Instructor discusses reasons, side effects, and lifestyle considerations for each drug class.   Cardiac Drugs II:  -Group instruction provided by verbal instruction and written materials to support subject.  Instructor reviews cardiac drug classes: angiotensin converting enzyme inhibitors (ACE-I), angiotensin II receptor blockers (ARBs), nitrates, and calcium channel blockers.  Instructor discusses reasons, side effects, and lifestyle considerations for each drug class.   Anatomy and Physiology of the Circulatory System:  -Group instruction provided by verbal instruction, video, and written materials to support subject.  Reviews functional anatomy of heart, how it relates to various diagnoses, and what role the heart plays in the overall system.   Knowledge Questionnaire Score:   Core Components/Risk Factors/Patient Goals at Admission:     Personal Goals and Risk Factors at Admission - 08/28/15 1645    Core Components/Risk Factors/Patient Goals on Admission    Weight Management Obesity;Yes   Intervention Obesity: Provide education and appropriate resources to help participant work on and attain dietary goals.;Weight Management/Obesity: Establish reasonable short term and long term weight goals.   Admit Weight 211 lb 3.2 oz (95.8 kg)   Expected  Outcomes Short Term: Continue to assess and modify interventions until short term weight is achieved;Long Term: Adherence to nutrition and physical activity/exercise program aimed toward attainment of established weight goal   Sedentary Yes   Intervention Provide advice, education, support and counseling about physical activity/exercise needs.;Develop an individualized exercise prescription for aerobic and resistive training based on initial evaluation findings, risk stratification, comorbidities and participant's personal goals.   Expected Outcomes Achievement of increased cardiorespiratory fitness and enhanced flexibility, muscular endurance and strength shown through measurements of functional capacity and personal statement of participant.   Increase Strength and Stamina Yes   Intervention Provide advice, education, support and counseling about physical activity/exercise needs.;Develop an individualized exercise prescription for aerobic and resistive training based on initial evaluation findings, risk stratification, comorbidities and participant's personal goals.   Expected Outcomes Achievement of increased cardiorespiratory fitness and enhanced flexibility, muscular endurance and strength shown through measurements of functional capacity and personal statement of participant.   Tobacco Cessation Yes   Intervention Assist the participant in steps to quit. Provide individualized education and counseling about committing to Tobacco Cessation, relapse prevention, and pharmacological support that can be provided by physician.;Advice worker, assist with locating and accessing local/national Quit Smoking programs, and support quit date choice.   Expected Outcomes Short Term: Will demonstrate readiness to quit, by selecting a quit date.   Diabetes Yes   Intervention Provide education about proper nutrition, including hydration, and aerobic/resistive exercise prescription along with  prescribed medications to achieve blood glucose in normal ranges: Fasting glucose 65-99 mg/dL;Provide education about signs/symptoms and action to take for hypo/hyperglycemia.   Expected Outcomes Short Term: Participant verbalizes understanding of the signs/symptoms and  immediate care of hyper/hypoglycemia, proper foot care and importance of medication, aerobic/resistive exercise and nutrition plan for blood glucose control.   Hypertension Yes   Intervention Provide education on lifestyle modifcations including regular physical activity/exercise, weight management, moderate sodium restriction and increased consumption of fresh fruit, vegetables, and low fat dairy, alcohol moderation, and smoking cessation.;Monitor prescription use compliance.   Expected Outcomes Short Term: Continued assessment and intervention until BP is < 140/69mm HG in hypertensive participants. < 130/62mm HG in hypertensive participants with diabetes, heart failure or chronic kidney disease.   Lipids Yes   Intervention Provide education and support for participant on nutrition & aerobic/resistive exercise along with prescribed medications to achieve LDL 70mg , HDL >40mg .   Expected Outcomes Short Term: Participant states understanding of desired cholesterol values and is compliant with medications prescribed. Participant is following exercise prescription and nutrition guidelines.   Stress Yes   Intervention Offer individual and/or small group education and counseling on adjustment to heart disease, stress management and health-related lifestyle change. Teach and support self-help strategies.;Refer participants experiencing significant psychosocial distress to appropriate mental health specialists for further evaluation and treatment. When possible, include family members and significant others in education/counseling sessions.   Expected Outcomes Short Term: Participant demonstrates changes in health-related behavior, relaxation and  other stress management skills, ability to obtain effective social support, and compliance with psychotropic medications if prescribed.;Long Term: Emotional wellbeing is indicated by absence of clinically significant psychosocial distress or social isolation.      Core Components/Risk Factors/Patient Goals Review:    Core Components/Risk Factors/Patient Goals at Discharge (Final Review):    ITP Comments:     ITP Comments      08/28/15 1642           ITP Comments Dr. Fransico Him, MD Medical Director           Comments: Pt is making expected progress toward personal goals after completing 3 sessions. Recommend continued exercise and life style modification education including  stress management and relaxation techniques to decrease cardiac risk profile.

## 2015-09-10 ENCOUNTER — Other Ambulatory Visit: Payer: Self-pay | Admitting: *Deleted

## 2015-09-10 ENCOUNTER — Encounter (HOSPITAL_COMMUNITY)
Admission: RE | Admit: 2015-09-10 | Discharge: 2015-09-10 | Disposition: A | Discharge status: Home / Self Care | Payer: Commercial Managed Care - HMO | Source: Ambulatory Visit | Attending: Cardiology | Admitting: Cardiology

## 2015-09-10 DIAGNOSIS — I214 Non-ST elevation (NSTEMI) myocardial infarction: Secondary | ICD-10-CM | POA: Diagnosis not present

## 2015-09-10 DIAGNOSIS — Z951 Presence of aortocoronary bypass graft: Secondary | ICD-10-CM

## 2015-09-10 DIAGNOSIS — Z79899 Other long term (current) drug therapy: Secondary | ICD-10-CM | POA: Diagnosis not present

## 2015-09-10 DIAGNOSIS — G8918 Other acute postprocedural pain: Secondary | ICD-10-CM

## 2015-09-10 MED ORDER — TRAMADOL HCL 50 MG PO TABS: 50.0000 mg | ORAL_TABLET | Freq: Four times a day (QID) | ORAL | Status: DC | PRN

## 2015-09-12 ENCOUNTER — Encounter (HOSPITAL_COMMUNITY)
Admission: RE | Admit: 2015-09-12 | Discharge: 2015-09-12 | Disposition: A | Discharge status: Home / Self Care | Payer: Commercial Managed Care - HMO | Source: Ambulatory Visit | Attending: Cardiology | Admitting: Cardiology

## 2015-09-12 DIAGNOSIS — Z79899 Other long term (current) drug therapy: Secondary | ICD-10-CM | POA: Diagnosis not present

## 2015-09-12 DIAGNOSIS — Z951 Presence of aortocoronary bypass graft: Secondary | ICD-10-CM

## 2015-09-12 DIAGNOSIS — I214 Non-ST elevation (NSTEMI) myocardial infarction: Secondary | ICD-10-CM | POA: Diagnosis not present

## 2015-09-14 ENCOUNTER — Encounter (HOSPITAL_COMMUNITY)
Admission: RE | Admit: 2015-09-14 | Discharge: 2015-09-14 | Disposition: A | Discharge status: Home / Self Care | Payer: Commercial Managed Care - HMO | Source: Ambulatory Visit | Attending: Cardiology | Admitting: Cardiology

## 2015-09-14 ENCOUNTER — Encounter (HOSPITAL_COMMUNITY): Payer: Self-pay

## 2015-09-14 DIAGNOSIS — Z79899 Other long term (current) drug therapy: Secondary | ICD-10-CM | POA: Diagnosis not present

## 2015-09-14 DIAGNOSIS — Z951 Presence of aortocoronary bypass graft: Secondary | ICD-10-CM

## 2015-09-14 DIAGNOSIS — I214 Non-ST elevation (NSTEMI) myocardial infarction: Secondary | ICD-10-CM | POA: Diagnosis not present

## 2015-09-14 NOTE — Progress Notes (Signed)
[name] [name] 55 y.o. female Nutrition Note Spoke with pt. Nutrition Plan discussed with pt. Pt has not returned her Nutrition Survey at this time. Pt has been trying to follow a Therapeutic Lifestyle Changes diet. Pt's ability to follow diet limited by pt finances and pt lives with her son and daughter-in-law, who buy the groceries. Pt is diabetic. Last A1c indicates blood poorly controlled. Pt wants to lose wt. Wt loss tips reviewed. Pt expressed understanding of the information reviewed. Pt aware of nutrition education classes offered.  Lab Results  Component Value Date   HGBA1C 8.9* 06/12/2015   Wt Readings from Last 3 Encounters:  08/28/15 211 lb 3.2 oz (95.8 kg)  08/22/15 205 lb (92.987 kg)  07/30/15 205 lb (92.987 kg)    Nutrition Diagnosis ? Food-and nutrition-related knowledge deficit related to lack of exposure to information as related to diagnosis of: ? CVD ? DM ? Obesity related to excessive energy intake as evidenced by a BMI of 33.1  Nutrition Intervention ? Pt's individual nutrition plan reviewed with pt. ? Benefits of adopting Therapeutic Lifestyle Changes discussed when Medficts reviewed. ? Pt to attend the Portion Distortion class ? Pt to attend the Diabetes Q & A class ? Pt to attend the   ? Nutrition I class                  ? Nutrition II class     ? Diabetes Blitz class ? Pt given handouts for: ? Nutrition I class ? Nutrition II class ? Diabetes Blitz Class ? 1500 kcal, 5 day menu ideas ? Continue client-centered nutrition education by RD, as part of interdisciplinary care. Goal(s) ? Pt to identify and limit food sources of saturated fat, trans fat, and sodium ? Pt to identify food quantities necessary to achieve weight loss of 6-24 lb (2.7-10.9 kg) at graduation from cardiac rehab.  ? CBG concentrations in the normal range or as close to normal as is safely possible. Monitor and Evaluate progress toward nutrition goal with team. Derek Mound, M.Ed, RD, LDN,  CDE 09/14/2015 2:48 PM

## 2015-09-14 NOTE — Progress Notes (Signed)
Discharge Summary  Patient Details  Name: [name] [name] MRN: [id number] Date of Birth: 18-Mar-1960 Referring Provider:        [place] [facility] from 08/28/2015 in [place]   Referring Provider  Doren Custard MD       Number of Visits: 6 Reason for Discharge:  Early Exit:  Insurance, pt with high insurance copayment, exited program early. Pt plans to exercise on her own at local [employer] gym.   Smoking History:  History  Smoking status  . Former Smoker -- 0.50 packs/day for 36 years  . Types: Cigarettes  . Quit date: 06/12/2014  Smokeless tobacco  . Never Used    Diagnosis:  S/P CABG (coronary artery bypass graft)  ADL [facility]:   Initial Exercise Prescription:     Initial Exercise Prescription - 08/29/15 0700    Date of Initial Exercise RX and Referring Provider   Date 08/28/15   Referring Provider Doren Custard MD   Treadmill   MPH 2.5   Grade 1   Minutes 10   METs 3.26   Bike   Level 1.2   Minutes 10   METs 3.38   NuStep   Level 3   Minutes 10   METs 2.5   Prescription Details   Frequency (times per week) 3   Duration Progress to 30 minutes of continuous aerobic without signs/symptoms of physical distress   Intensity   THRR 40-80% of Max Heartrate 66-132   Ratings of Perceived Exertion 11-13   Progression   Progression Continue to progress workloads to maintain intensity without signs/symptoms of physical distress.   Resistance Training   Training Prescription Yes   Weight 2 lb   Reps 10-12      Discharge Exercise Prescription (Final Exercise Prescription Changes):   Functional Capacity:     6 Minute Walk      08/28/15 1705       6 Minute Walk   Phase Initial     Distance 1610 feet     Walk Time 6 minutes     # of Rest Breaks 0     MPH 3.05     METS 4.13     RPE 11     VO2 Peak 14.45     Symptoms Yes (comment)     Comments Bilateral ankle soreness secondary to RA and bone spurs.     Resting HR 91 bpm     Resting BP 138/88 mmHg     Max Ex. HR 114 bpm     Max Ex. BP 144/90 mmHg     2 Minute Post BP 132/90 mmHg        Psychological, QOL, Others - Outcomes: PHQ 2/9: Depression screen [facility] 2/9 09/14/2015 09/03/2015 07/05/2015  Decreased Interest 0 0 0  Down, Depressed, Hopeless 1 1 0  PHQ - 2 Score 1 1 0    Quality of Life:   Personal Goals: Goals established at orientation with interventions provided to work toward goal.     Personal Goals and Risk Factors at Admission - 08/28/15 1645    Core Components/Risk Factors/Patient Goals on Admission    Weight Management Obesity;Yes   Intervention Obesity: Provide education and appropriate resources to help participant work on and attain dietary goals.;Weight Management/Obesity: Establish reasonable short term and long term weight goals.   Admit Weight 211 lb 3.2 oz (95.8 kg)   Expected Outcomes Short Term: Continue to assess and modify interventions until short  term weight is achieved;Long Term: Adherence to nutrition and physical activity/exercise program aimed toward attainment of established weight goal   Sedentary Yes   Intervention Provide advice, education, support and counseling about physical activity/exercise needs.;Develop an individualized exercise prescription for aerobic and resistive training based on initial evaluation findings, risk stratification, comorbidities and participant's personal goals.   Expected Outcomes Achievement of increased cardiorespiratory fitness and enhanced flexibility, muscular endurance and strength shown through measurements of functional capacity and personal statement of participant.   Increase Strength and Stamina Yes   Intervention Provide advice, education, support and counseling about physical activity/exercise needs.;Develop an individualized exercise prescription for aerobic and resistive training based on initial evaluation findings, risk stratification, comorbidities and  participant's personal goals.   Expected Outcomes Achievement of increased cardiorespiratory fitness and enhanced flexibility, muscular endurance and strength shown through measurements of functional capacity and personal statement of participant.   Tobacco Cessation Yes   Intervention Assist the participant in steps to quit. Provide individualized education and counseling about committing to Tobacco Cessation, relapse prevention, and pharmacological support that can be provided by physician.;Advice worker, assist with locating and accessing local/national Quit Smoking programs, and support quit date choice.   Expected Outcomes Short Term: Will demonstrate readiness to quit, by selecting a quit date.   Diabetes Yes   Intervention Provide education about proper nutrition, including hydration, and aerobic/resistive exercise prescription along with prescribed medications to achieve blood glucose in normal ranges: Fasting glucose 65-99 mg/dL;Provide education about signs/symptoms and action to take for hypo/hyperglycemia.   Expected Outcomes Short Term: Participant verbalizes understanding of the signs/symptoms and immediate care of hyper/hypoglycemia, proper foot care and importance of medication, aerobic/resistive exercise and nutrition plan for blood glucose control.   Hypertension Yes   Intervention Provide education on lifestyle modifcations including regular physical activity/exercise, weight management, moderate sodium restriction and increased consumption of fresh fruit, vegetables, and low fat dairy, alcohol moderation, and smoking cessation.;Monitor prescription use compliance.   Expected Outcomes Short Term: Continued assessment and intervention until BP is < 140/79mm HG in hypertensive participants. < 130/57mm HG in hypertensive participants with diabetes, heart failure or chronic kidney disease.   Lipids Yes   Intervention Provide education and support for participant on  nutrition & aerobic/resistive exercise along with prescribed medications to achieve LDL 70mg , HDL >40mg .   Expected Outcomes Short Term: Participant states understanding of desired cholesterol values and is compliant with medications prescribed. Participant is following exercise prescription and nutrition guidelines.   Stress Yes   Intervention Offer individual and/or small group education and counseling on adjustment to heart disease, stress management and health-related lifestyle change. Teach and support self-help strategies.;Refer participants experiencing significant psychosocial distress to appropriate mental health specialists for further evaluation and treatment. When possible, include family members and significant others in education/counseling sessions.   Expected Outcomes Short Term: Participant demonstrates changes in health-related behavior, relaxation and other stress management skills, ability to obtain effective social support, and compliance with psychotropic medications if prescribed.;Long Term: Emotional wellbeing is indicated by absence of clinically significant psychosocial distress or social isolation.       Personal Goals Discharge:   Nutrition & Weight - Outcomes:     Pre Biometrics - 08/28/15 1701    Pre Biometrics   Waist Circumference 38.75 inches   Hip Circumference 45.75 inches   Waist to Hip Ratio 0.85 %   Triceps Skinfold 42 mm   % Body Fat 43.9 %   Grip Strength 31.5 kg   Flexibility  7.5 in   Single Leg Stand 4.31 seconds       Nutrition:     Nutrition Therapy & Goals - 09/14/15 1456    Nutrition Therapy   Diet Carb Modified, Therapeutic Lifestyle Changes   Personal Nutrition Goals   Personal Goal #1 1-2 lb wt loss per week for a wt loss goal of 6-24 lb   Personal Goal #2 Improved glycemic control as evidenced by a decrease in pt's HbA1c with an ultimate goal of < 7.0   Intervention Plan   Intervention Prescribe, educate and counsel regarding  individualized specific dietary modifications aiming towards targeted core components such as weight, hypertension, lipid management, diabetes, heart failure and other comorbidities.;Nutrition handout(s) given to patient.  1500 kcal, 5 day menu ideas   Expected Outcomes Short Term Goal: Understand basic principles of dietary content, such as calories, fat, sodium, cholesterol and nutrients.;Long Term Goal: Adherence to prescribed nutrition plan.      Nutrition Discharge:     Nutrition Assessments - 09/14/15 1457    MEDFICTS Scores   Pre Score --  pt has not returned nutrition survey      Education Questionnaire Score:   Goals reviewed with patient; copy given to patient.

## 2015-09-30 ENCOUNTER — Other Ambulatory Visit: Payer: Self-pay | Admitting: Physician Assistant

## 2015-10-03 ENCOUNTER — Telehealth: Payer: Self-pay | Admitting: Cardiology

## 2015-10-03 MED ORDER — NITROGLYCERIN 0.4 MG SL SUBL: 0.4000 mg | SUBLINGUAL_TABLET | SUBLINGUAL | Status: DC | PRN

## 2015-10-03 NOTE — Telephone Encounter (Signed)
No available APP appointments this week.  Patient added on for EKG/VS check 7/21 NTG Rx sent to pharmacy - patient educated on use of this medication

## 2015-10-03 NOTE — Telephone Encounter (Signed)
New message  Pt c/o of Chest Pain: STAT if CP now or developed within 24 hours  1. Are you having CP right now? yes  2. Are you experiencing any other symptoms (ex. SOB, nausea, vomiting, sweating)? sob 3. How long have you been experiencing CP? 3 week  4. Is your CP continuous or coming and going? continuous  5. Have you taken Nitroglycerin? no ?

## 2015-10-03 NOTE — Telephone Encounter (Signed)
Per Dr. Oval Linsey - it is advised that patient have a PRN Rx for NTG 0.4 SL to use as needed for chest pain. Keep planned follow up with Dr. Martinique. Would be preferable to have patient seen this week by cardiology provider, if unable to schedule, bring in for EKG/VS check

## 2015-10-03 NOTE — Telephone Encounter (Signed)
Spoke with patient. She states she has been having chest pain for 3 weeks - s/p CABG x3 in April 2017. She describes the pain as a tightness and then describes as an ache "like when you have the flu". She states her chest hurts when she breathes in, when she yawns, when she burps. She states her pain is worse with upper extremity movement but she has been having left shoulder pain for 3 days. She states her arms are numb in the AM and her hands feel tight. She has shortness of breath associated w/onset of chest pain - she gets out of breath when walking to mailbox daily. She did not endorse SOB prior to chest pain onset. She is treating her pain with tramadol, which is what she has to take to help her rest. She complains of nausea. She states she feels hot and then cold. She also reports her sweat glands are infected and she is trying to get in to see her PCP for this and she has an active yeast infection for which she is taking OTC medications.   Advised patient that Dr. Martinique is OOO but will speak with DOD, Dr. Oval Linsey, for advice. Informed her that the recommendation may be that she need to go to ED for evaluation. She voiced understanding.

## 2015-10-04 ENCOUNTER — Encounter (HOSPITAL_COMMUNITY)
Admission: EM | Disposition: A | Discharge status: Home / Self Care | Payer: Self-pay | Source: Home / Self Care | Attending: Cardiology

## 2015-10-04 ENCOUNTER — Emergency Department (HOSPITAL_COMMUNITY): Payer: Commercial Managed Care - HMO

## 2015-10-04 ENCOUNTER — Inpatient Hospital Stay (HOSPITAL_COMMUNITY)
Admission: EM | Admit: 2015-10-04 | Discharge: 2015-10-08 | DRG: 247 | Disposition: A | Discharge status: Home / Self Care | Payer: Commercial Managed Care - HMO | Attending: Cardiology | Admitting: Cardiology

## 2015-10-04 ENCOUNTER — Encounter (HOSPITAL_COMMUNITY): Payer: Self-pay | Admitting: Student

## 2015-10-04 DIAGNOSIS — I10 Essential (primary) hypertension: Secondary | ICD-10-CM | POA: Diagnosis present

## 2015-10-04 DIAGNOSIS — Z7982 Long term (current) use of aspirin: Secondary | ICD-10-CM | POA: Diagnosis not present

## 2015-10-04 DIAGNOSIS — Z87891 Personal history of nicotine dependence: Secondary | ICD-10-CM | POA: Diagnosis not present

## 2015-10-04 DIAGNOSIS — I213 ST elevation (STEMI) myocardial infarction of unspecified site: Secondary | ICD-10-CM

## 2015-10-04 DIAGNOSIS — F419 Anxiety disorder, unspecified: Secondary | ICD-10-CM | POA: Diagnosis present

## 2015-10-04 DIAGNOSIS — E785 Hyperlipidemia, unspecified: Secondary | ICD-10-CM | POA: Diagnosis not present

## 2015-10-04 DIAGNOSIS — Z7984 Long term (current) use of oral hypoglycemic drugs: Secondary | ICD-10-CM | POA: Diagnosis not present

## 2015-10-04 DIAGNOSIS — I2119 ST elevation (STEMI) myocardial infarction involving other coronary artery of inferior wall: Principal | ICD-10-CM

## 2015-10-04 DIAGNOSIS — I25810 Atherosclerosis of coronary artery bypass graft(s) without angina pectoris: Secondary | ICD-10-CM | POA: Diagnosis present

## 2015-10-04 DIAGNOSIS — Z95820 Peripheral vascular angioplasty status with implants and grafts: Secondary | ICD-10-CM

## 2015-10-04 DIAGNOSIS — Z7902 Long term (current) use of antithrombotics/antiplatelets: Secondary | ICD-10-CM

## 2015-10-04 DIAGNOSIS — E1159 Type 2 diabetes mellitus with other circulatory complications: Secondary | ICD-10-CM

## 2015-10-04 DIAGNOSIS — Z955 Presence of coronary angioplasty implant and graft: Secondary | ICD-10-CM

## 2015-10-04 DIAGNOSIS — E119 Type 2 diabetes mellitus without complications: Secondary | ICD-10-CM | POA: Diagnosis present

## 2015-10-04 DIAGNOSIS — M069 Rheumatoid arthritis, unspecified: Secondary | ICD-10-CM | POA: Diagnosis present

## 2015-10-04 DIAGNOSIS — I2111 ST elevation (STEMI) myocardial infarction involving right coronary artery: Secondary | ICD-10-CM

## 2015-10-04 DIAGNOSIS — I252 Old myocardial infarction: Secondary | ICD-10-CM | POA: Diagnosis not present

## 2015-10-04 DIAGNOSIS — T82857A Stenosis of cardiac prosthetic devices, implants and grafts, initial encounter: Secondary | ICD-10-CM | POA: Diagnosis present

## 2015-10-04 DIAGNOSIS — Z8673 Personal history of transient ischemic attack (TIA), and cerebral infarction without residual deficits: Secondary | ICD-10-CM | POA: Diagnosis not present

## 2015-10-04 DIAGNOSIS — Z79899 Other long term (current) drug therapy: Secondary | ICD-10-CM | POA: Diagnosis not present

## 2015-10-04 DIAGNOSIS — E876 Hypokalemia: Secondary | ICD-10-CM | POA: Diagnosis present

## 2015-10-04 DIAGNOSIS — I2510 Atherosclerotic heart disease of native coronary artery without angina pectoris: Secondary | ICD-10-CM

## 2015-10-04 DIAGNOSIS — R079 Chest pain, unspecified: Secondary | ICD-10-CM | POA: Diagnosis present

## 2015-10-04 HISTORY — DX: Atherosclerosis of coronary artery bypass graft(s) without angina pectoris: I25.810

## 2015-10-04 HISTORY — DX: ST elevation (STEMI) myocardial infarction involving right coronary artery: I21.11

## 2015-10-04 HISTORY — PX: CARDIAC CATHETERIZATION: SHX172

## 2015-10-04 HISTORY — DX: Peripheral vascular angioplasty status with implants and grafts: Z95.820

## 2015-10-04 HISTORY — DX: Atherosclerotic heart disease of native coronary artery without angina pectoris: I25.10

## 2015-10-04 LAB — POCT I-STAT, CHEM 8
BUN: 17 mg/dL (ref 6–20)
Calcium, Ion: 1.06 mmol/L — ABNORMAL LOW (ref 1.13–1.30)
Chloride: 100 mmol/L — ABNORMAL LOW (ref 101–111)
Creatinine, Ser: 0.8 mg/dL (ref 0.44–1.00)
Glucose, Bld: 193 mg/dL — ABNORMAL HIGH (ref 65–99)
HCT: 47 % — ABNORMAL HIGH (ref 36.0–46.0)
Hemoglobin: 16 g/dL — ABNORMAL HIGH (ref 12.0–15.0)
Potassium: 3.6 mmol/L (ref 3.5–5.1)
Sodium: 136 mmol/L (ref 135–145)
TCO2: 24 mmol/L (ref 0–100)

## 2015-10-04 LAB — BASIC METABOLIC PANEL
Anion gap: 13 (ref 5–15)
BUN: 15 mg/dL (ref 6–20)
CO2: 21 mmol/L — ABNORMAL LOW (ref 22–32)
Calcium: 9.8 mg/dL (ref 8.9–10.3)
Chloride: 100 mmol/L — ABNORMAL LOW (ref 101–111)
Creatinine, Ser: 1 mg/dL (ref 0.44–1.00)
GFR calc Af Amer: >60 mL/min (ref >60)
GFR calc non Af Amer: >60 mL/min (ref >60)
Glucose, Bld: 187 mg/dL — ABNORMAL HIGH (ref 65–99)
Potassium: 3.7 mmol/L (ref 3.5–5.1)
Sodium: 134 mmol/L — ABNORMAL LOW (ref 135–145)

## 2015-10-04 LAB — PROTIME-INR
INR: 1.23 (ref 0.00–1.49)
Prothrombin Time: 15.7 seconds — ABNORMAL HIGH (ref 11.6–15.2)

## 2015-10-04 LAB — MRSA PCR SCREENING: MRSA by PCR: NEGATIVE

## 2015-10-04 LAB — GLUCOSE, CAPILLARY: Glucose-Capillary: 198 mg/dL — ABNORMAL HIGH (ref 65–99)

## 2015-10-04 LAB — POCT I-STAT TROPONIN I: Troponin i, poc: 29.38 ng/mL (ref 0.00–0.08)

## 2015-10-04 SURGERY — LEFT HEART CATH AND CORONARY ANGIOGRAPHY
Anesthesia: LOCAL

## 2015-10-04 MED ORDER — LIDOCAINE HCL (PF) 1 % IJ SOLN
INTRAMUSCULAR | Status: AC
  Filled 2015-10-04: qty 30

## 2015-10-04 MED ORDER — SIMVASTATIN 40 MG PO TABS
40.0000 mg | ORAL_TABLET | Freq: Every day | ORAL | Status: DC
  Administered 2015-10-04: 40 mg via ORAL
  Filled 2015-10-04: qty 1

## 2015-10-04 MED ORDER — METOPROLOL TARTRATE 5 MG/5ML IV SOLN
5.0000 mg | Freq: Once | INTRAVENOUS | Status: AC
  Administered 2015-10-04: 5 mg via INTRAVENOUS
  Filled 2015-10-04: qty 5

## 2015-10-04 MED ORDER — HEPARIN (PORCINE) IN NACL 100-0.45 UNIT/ML-% IJ SOLN
1150.0000 [IU]/h | INTRAMUSCULAR | Status: DC
  Administered 2015-10-04: 1150 [IU]/h via INTRAVENOUS
  Filled 2015-10-04: qty 250

## 2015-10-04 MED ORDER — NITROGLYCERIN 0.4 MG SL SUBL: 0.4000 mg | SUBLINGUAL_TABLET | SUBLINGUAL | Status: DC | PRN

## 2015-10-04 MED ORDER — TICAGRELOR 90 MG PO TABS
90.0000 mg | ORAL_TABLET | Freq: Two times a day (BID) | ORAL | Status: DC
  Administered 2015-10-04 – 2015-10-08 (×8): 90 mg via ORAL
  Filled 2015-10-04 (×8): qty 1

## 2015-10-04 MED ORDER — LINAGLIPTIN 5 MG PO TABS
5.0000 mg | ORAL_TABLET | Freq: Every day | ORAL | Status: DC
  Administered 2015-10-05 – 2015-10-08 (×4): 5 mg via ORAL
  Filled 2015-10-04 (×4): qty 1

## 2015-10-04 MED ORDER — VERAPAMIL HCL 2.5 MG/ML IV SOLN
INTRAVENOUS | Status: AC
  Filled 2015-10-04: qty 2

## 2015-10-04 MED ORDER — ENOXAPARIN SODIUM 40 MG/0.4ML ~~LOC~~ SOLN: 40.0000 mg | SUBCUTANEOUS | Status: DC

## 2015-10-04 MED ORDER — SODIUM CHLORIDE 0.9 % IV SOLN: Freq: Once | INTRAVENOUS | Status: DC

## 2015-10-04 MED ORDER — TICAGRELOR 90 MG PO TABS
ORAL_TABLET | ORAL | Status: AC
  Filled 2015-10-04: qty 2

## 2015-10-04 MED ORDER — SODIUM CHLORIDE 0.9% FLUSH: 3.0000 mL | INTRAVENOUS | Status: DC | PRN

## 2015-10-04 MED ORDER — SODIUM CHLORIDE 0.9 % IV BOLUS (SEPSIS)
500.0000 mL | Freq: Once | INTRAVENOUS | Status: AC
  Administered 2015-10-04: 500 mL via INTRAVENOUS

## 2015-10-04 MED ORDER — HEPARIN (PORCINE) IN NACL 2-0.9 UNIT/ML-% IJ SOLN
INTRAMUSCULAR | Status: DC | PRN
  Administered 2015-10-04: 16:00:00

## 2015-10-04 MED ORDER — ONDANSETRON HCL 4 MG/2ML IJ SOLN
4.0000 mg | Freq: Four times a day (QID) | INTRAMUSCULAR | Status: DC | PRN
  Administered 2015-10-04: 4 mg via INTRAVENOUS
  Filled 2015-10-04: qty 2

## 2015-10-04 MED ORDER — BIVALIRUDIN 250 MG IV SOLR
INTRAVENOUS | Status: AC
  Filled 2015-10-04: qty 250

## 2015-10-04 MED ORDER — NITROGLYCERIN 1 MG/10 ML FOR IR/CATH LAB
INTRA_ARTERIAL | Status: AC
  Filled 2015-10-04: qty 10

## 2015-10-04 MED ORDER — IOPAMIDOL (ISOVUE-370) INJECTION 76%
INTRAVENOUS | Status: DC | PRN
  Administered 2015-10-04: 220 mL via INTRA_ARTERIAL

## 2015-10-04 MED ORDER — ONDANSETRON HCL 4 MG/2ML IJ SOLN: 4.0000 mg | Freq: Four times a day (QID) | INTRAMUSCULAR | Status: DC | PRN

## 2015-10-04 MED ORDER — IOPAMIDOL (ISOVUE-370) INJECTION 76%
INTRAVENOUS | Status: AC
  Filled 2015-10-04: qty 125

## 2015-10-04 MED ORDER — FENTANYL CITRATE (PF) 100 MCG/2ML IJ SOLN
INTRAMUSCULAR | Status: DC | PRN
  Administered 2015-10-04 (×3): 25 ug via INTRAVENOUS

## 2015-10-04 MED ORDER — CLOPIDOGREL BISULFATE 75 MG PO TABS: 75.0000 mg | ORAL_TABLET | Freq: Every day | ORAL | Status: DC

## 2015-10-04 MED ORDER — BIVALIRUDIN BOLUS VIA INFUSION - CUPID
INTRAVENOUS | Status: DC | PRN
  Administered 2015-10-04: 72 mg via INTRAVENOUS

## 2015-10-04 MED ORDER — ACETAMINOPHEN 325 MG PO TABS: 650.0000 mg | ORAL_TABLET | ORAL | Status: DC | PRN

## 2015-10-04 MED ORDER — MIDAZOLAM HCL 2 MG/2ML IJ SOLN
INTRAMUSCULAR | Status: AC
  Filled 2015-10-04: qty 2

## 2015-10-04 MED ORDER — SODIUM CHLORIDE 0.9 % IV SOLN
250.0000 mg | INTRAVENOUS | Status: DC | PRN
  Administered 2015-10-04: 1 mg/kg/h via INTRAVENOUS

## 2015-10-04 MED ORDER — SODIUM CHLORIDE 0.9 % WEIGHT BASED INFUSION
1.0000 mL/kg/h | INTRAVENOUS | Status: AC
  Administered 2015-10-04: 1 mL/kg/h via INTRAVENOUS

## 2015-10-04 MED ORDER — IOPAMIDOL (ISOVUE-370) INJECTION 76%
INTRAVENOUS | Status: AC
  Filled 2015-10-04: qty 100

## 2015-10-04 MED ORDER — OLMESARTAN-AMLODIPINE-HCTZ 20-5-12.5 MG PO TABS: 0.5000 | ORAL_TABLET | Freq: Every day | ORAL | Status: DC

## 2015-10-04 MED ORDER — ACETAMINOPHEN 325 MG PO TABS
650.0000 mg | ORAL_TABLET | ORAL | Status: DC | PRN
  Administered 2015-10-04 – 2015-10-07 (×5): 650 mg via ORAL
  Filled 2015-10-04 (×6): qty 2

## 2015-10-04 MED ORDER — TICAGRELOR 90 MG PO TABS
ORAL_TABLET | ORAL | Status: DC | PRN
  Administered 2015-10-04: 180 mg via ORAL

## 2015-10-04 MED ORDER — HEPARIN (PORCINE) IN NACL 2-0.9 UNIT/ML-% IJ SOLN
INTRAMUSCULAR | Status: AC
  Filled 2015-10-04: qty 1000

## 2015-10-04 MED ORDER — SODIUM CHLORIDE 0.9% FLUSH
3.0000 mL | Freq: Two times a day (BID) | INTRAVENOUS | Status: DC
  Administered 2015-10-04 – 2015-10-07 (×7): 3 mL via INTRAVENOUS

## 2015-10-04 MED ORDER — NITROGLYCERIN 1 MG/10 ML FOR IR/CATH LAB
INTRA_ARTERIAL | Status: DC | PRN
Start: 2015-10-04 — End: 2015-10-04
  Administered 2015-10-04: 200 ug via INTRACORONARY

## 2015-10-04 MED ORDER — HEPARIN SODIUM (PORCINE) 5000 UNIT/ML IJ SOLN
5000.0000 [IU] | Freq: Three times a day (TID) | INTRAMUSCULAR | Status: DC
  Administered 2015-10-04 – 2015-10-07 (×2): 5000 [IU] via SUBCUTANEOUS
  Filled 2015-10-04 (×2): qty 1

## 2015-10-04 MED ORDER — METOPROLOL TARTRATE 12.5 MG HALF TABLET
12.5000 mg | ORAL_TABLET | Freq: Two times a day (BID) | ORAL | Status: DC
  Administered 2015-10-04: 12.5 mg via ORAL
  Filled 2015-10-04: qty 1

## 2015-10-04 MED ORDER — ASPIRIN 81 MG PO CHEW
324.0000 mg | CHEWABLE_TABLET | Freq: Once | ORAL | Status: AC
  Administered 2015-10-04: 324 mg via ORAL
  Filled 2015-10-04: qty 4

## 2015-10-04 MED ORDER — FENTANYL CITRATE (PF) 100 MCG/2ML IJ SOLN
INTRAMUSCULAR | Status: AC
  Filled 2015-10-04: qty 2

## 2015-10-04 MED ORDER — INSULIN ASPART 100 UNIT/ML ~~LOC~~ SOLN
0.0000 [IU] | Freq: Three times a day (TID) | SUBCUTANEOUS | Status: DC
  Administered 2015-10-05 (×3): 3 [IU] via SUBCUTANEOUS
  Administered 2015-10-06: 2 [IU] via SUBCUTANEOUS
  Administered 2015-10-06: 3 [IU] via SUBCUTANEOUS
  Administered 2015-10-06: 2 [IU] via SUBCUTANEOUS
  Administered 2015-10-07: 3 [IU] via SUBCUTANEOUS
  Administered 2015-10-07: 2 [IU] via SUBCUTANEOUS
  Administered 2015-10-08 (×2): 3 [IU] via SUBCUTANEOUS

## 2015-10-04 MED ORDER — MIDAZOLAM HCL 2 MG/2ML IJ SOLN
INTRAMUSCULAR | Status: DC | PRN
  Administered 2015-10-04 (×2): 1 mg via INTRAVENOUS

## 2015-10-04 MED ORDER — LIDOCAINE HCL (PF) 1 % IJ SOLN
INTRAMUSCULAR | Status: DC | PRN
  Administered 2015-10-04: 20 mL via INTRADERMAL

## 2015-10-04 MED ORDER — SODIUM CHLORIDE 0.9 % IV SOLN: 250.0000 mL | INTRAVENOUS | Status: DC | PRN

## 2015-10-04 MED ORDER — ASPIRIN 81 MG PO CHEW: 81.0000 mg | CHEWABLE_TABLET | Freq: Every day | ORAL | Status: DC

## 2015-10-04 MED ORDER — TRAZODONE HCL 50 MG PO TABS
50.0000 mg | ORAL_TABLET | Freq: Every evening | ORAL | Status: DC | PRN
  Administered 2015-10-04 – 2015-10-06 (×2): 50 mg via ORAL
  Filled 2015-10-04 (×2): qty 1

## 2015-10-04 MED ORDER — ASPIRIN EC 81 MG PO TBEC
81.0000 mg | DELAYED_RELEASE_TABLET | Freq: Every day | ORAL | Status: DC
  Administered 2015-10-05 – 2015-10-08 (×4): 81 mg via ORAL
  Filled 2015-10-04 (×4): qty 1

## 2015-10-04 MED ORDER — HEPARIN BOLUS VIA INFUSION
4000.0000 [IU] | Freq: Once | INTRAVENOUS | Status: AC
  Administered 2015-10-04: 4000 [IU] via INTRAVENOUS
  Filled 2015-10-04: qty 4000

## 2015-10-04 SURGICAL SUPPLY — 19 items
BALLN EMERGE MR 2.0X12 (BALLOONS) ×2
BALLOON EMERGE MR 2.0X12 (BALLOONS) IMPLANT
CATH INFINITI 5 FR RCB (CATHETERS) ×1 IMPLANT
CATH INFINITI 5FR AL1 (CATHETERS) ×1 IMPLANT
CATH INFINITI 5FR MULTPACK ANG (CATHETERS) ×1 IMPLANT
DEVICE WIRE ANGIOSEAL 6FR (Vascular Products) ×1 IMPLANT
GUIDE CATH RUNWAY 6FR AL 1 (CATHETERS) ×1 IMPLANT
GUIDE CATH RUNWAY 6FR CLS3.5 (CATHETERS) ×1 IMPLANT
KIT ENCORE 26 ADVANTAGE (KITS) ×1 IMPLANT
KIT HEART LEFT (KITS) ×2 IMPLANT
PACK CARDIAC CATHETERIZATION (CUSTOM PROCEDURE TRAY) ×2 IMPLANT
SHEATH PINNACLE 6F 10CM (SHEATH) ×1 IMPLANT
STENT PROMUS PREM MR 2.25X12 (Permanent Stent) ×1 IMPLANT
TRANSDUCER W/STOPCOCK (MISCELLANEOUS) ×2 IMPLANT
TUBING CIL FLEX 10 FLL-RA (TUBING) ×2 IMPLANT
VALVE GUARDIAN II ~~LOC~~ HEMO (MISCELLANEOUS) ×1 IMPLANT
WIRE ASAHI FIELDER XT 190CM (WIRE) ×2 IMPLANT
WIRE ASAHI PROWATER 180CM (WIRE) ×1 IMPLANT
WIRE EMERALD 3MM-J .035X150CM (WIRE) ×1 IMPLANT

## 2015-10-04 NOTE — ED Notes (Signed)
EDP and resident at bedside

## 2015-10-04 NOTE — ED Notes (Signed)
Patient to Cath Lab with RN/ED tech

## 2015-10-04 NOTE — Progress Notes (Signed)
Pt. Is complaining of intermittent shortness of breath.  Oxygen saturation is normal on room air and lungs are clear. Pt. Refused oxygen and said she thinks it might just be anxiety.  She thought she would be ok with something to help her sleep.  Pt. Was able to have long conversations with me and family over phone without becoming SOB.  It seems to be related to anxiety. MD made aware and trazodone ordered for sleep.  Will continue to monitor closely.

## 2015-10-04 NOTE — H&P (Signed)
History & Physical    Patient ID: [name] [name] MRN: [id number], DOB/AGE: 1960/12/29   Admit date: 10/04/2015   Primary Physician: Ileana Roup, MD Primary Cardiologist: Dr. Martinique   History of Present Illness    [name] [name] is a 55 y.o. female with past medical history of CAD (s/p CABG on 06/19/2015 w/ LIMA-LAD, SVG-RCA, SVG-OM2), Lupus, Type 2 DM, HTN, and HLD who presents to [facility] ED on 10/04/2015 for evaluation of chest pain.   She was recently hospitalized from 06/12/2015 for 06/25/2015. She initially presented with an NSTEMI with peak troponin of 0.79. Cath showed severe 3-vessel disease with 80% RCA stenosis, 85% Prox LAD stenosis, and 90% 1st Mrg with 85% 2nd Mrg. CABG was recommended and this was performed on 4/4 with the LIMA-LAD, SVG-RCA, and SVG-OM2. She tolerated the procedure and no complications were noted. She was started on ASA and Plavix. She was discharged in stable condition on 4/10.  She did develop a mild infection of her LLE and was on Keflex. On 5/15, she was seen by CT Surgery and noted to have superficial dehiscence of her vein graft site and was instructed to apply dressings. She went to cardiac rehab during the month of June and progressed well.  She presents today, reporting episodes of chest discomfort for the past 3 weeks. The pain can present at rest or with exertion. She reports being under intense stress due to the passing of her husband last year, and multiple aunts and uncles in the past few months and thinks her pain is worse when she is stressed. The pain has continued to intensity over the past few weeks, more notable worse yesterday. She had associated nausea and vomiting, and took several SL NTG throughout the day with minimal relief. She reports good compliance with her ASA and Plavix, having not missed any doses.  She reports giving up cigarettes in 05/2015 and has "not touched one since".   Upon arrival to the ED, she was noted to  have ST elevation in the inferior and lateral leads of her EKG. She was therefore taken emergently to the cardiac catheterization lab.   Past Medical History    Past Medical History  Diagnosis Date  . Hypertension   . Lupus ([place]) dx'd 2008    "they think I have this; have to get retested" (06/12/2015)  . 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 Empty sella ([place])   . Hidradenitis axillaris   . Obesity (BMI 30.0-34.9)   . Ovarian cyst   . Scoliosis   . Hyperlipemia   . Anxiety and depression   . Tobacco abuse   . Kidney stones   . PONV (postoperative nausea and vomiting)   . Anxiety   . Pneumonia X 2  . Type II diabetes mellitus ([place])   . Rheumatoid arthritis ([place])   . Stroke [facility]) March 2014    left MCA branches; "they say I have them all the time", denies residual on 06/12/2015  . CAD (coronary artery disease)     a. CABG 06/2015: LIMA-LAD, SVG-RCA, SVG-OM2    Past Surgical History  Procedure Laterality Date  . Tonsillectomy  1960s  . Breast biopsy Bilateral   . Dilation and curettage of uterus    . Abdominal hysterectomy    . Tubal ligation    . Cardiac catheterization N/A 06/13/2015    Procedure: Left Heart Cath and Coronary Angiography;  Surgeon: Peter M Martinique, MD;  Location: [facility] [facility];  Service: Cardiovascular;  Laterality: N/A;  . Coronary artery bypass graft N/A 06/19/2015    Procedure: CORONARY ARTERY BYPASS GRAFTING (CABG) TIMES FOUR USING LEFT INTERNAL MAMMARY, RIGHT SAPHENOUS LEG VEIN AND CRYO SAPHENOUS VEIN;  Surgeon: [name], MD;  Location: [place];  Service: Open Heart Surgery;  Laterality: N/A;  . Tee without cardioversion N/A 06/19/2015    Procedure: TRANSESOPHAGEAL ECHOCARDIOGRAM (TEE);  Surgeon: [name], MD;  Location: [place];  Service: Open Heart Surgery;  Laterality: N/A;     Allergies  No Known Allergies   Home Medications    Prior to Admission medications   Medication Sig Start Date End Date Taking? Authorizing Provider  aspirin EC 81  MG EC tablet Take 1 tablet (81 mg total) by mouth daily. 06/25/15   Donielle Liston Alba, [name]  Cholecalciferol (VITAMIN D3) 50000 units CAPS Take 50,000 Units by mouth once a week. 04/09/15   Historical Provider, MD  clopidogrel (PLAVIX) 75 MG tablet Take 75 mg by mouth at bedtime. 07/06/12   Penni Bombard, MD  INVOKANA 300 MG TABS tablet Take 300 mg by mouth daily. 06/05/15   Historical Provider, MD  Iron-Vitamins (GERITOL COMPLETE PO) Take 1 tablet by mouth daily.    Historical Provider, MD  JANUVIA 100 MG tablet Take 100 mg by mouth daily. 04/06/15   Historical Provider, MD  metFORMIN (GLUCOPHAGE) 1000 MG tablet Take 1,000 mg by mouth 2 (two) times daily. 04/06/15   Historical Provider, MD  metoprolol tartrate (LOPRESSOR) 25 MG tablet Take 0.5 tablets (12.5 mg total) by mouth 2 (two) times daily. 06/25/15   Donielle Liston Alba, [name]  nitroGLYCERIN (NITROSTAT) 0.4 MG SL tablet Place 1 tablet (0.4 mg total) under the tongue every 5 (five) minutes as needed for chest pain. MAX 3 doses 10/03/15   Peter M Martinique, MD  Olmesartan-Amlodipine-HCTZ 20-5-12.5 MG TABS Take 1 tablet by mouth daily. Patient taking differently: Take 0.5 tablets by mouth daily.  06/25/15   Donielle Liston Alba, [name]  promethazine (PHENERGAN) 12.5 MG tablet Take 1 tablet (12.5 mg total) by mouth every 6 (six) hours as needed for nausea or vomiting. 06/25/15   Melrose Nakayama, MD  simvastatin (ZOCOR) 40 MG tablet Take 40 mg by mouth daily. 04/10/15   Historical Provider, MD  traMADol (ULTRAM) 50 MG tablet Take 1 tablet (50 mg total) by mouth every 6 (six) hours as needed. 09/10/15   Grace Isaac, MD    Family History    Family History  Problem Relation Age of Onset  . Diabetes Mother   . Heart failure Mother   . Stroke Maternal Aunt   . Diabetes      maternal  . Breast cancer    . Alzheimer's disease      maternal    Social History    Social History   Social History  . Marital Status: Widowed    Spouse  Name: N/A  . Number of Children: 2  . Years of Education: college   Occupational History  . Disability    Social History Main Topics  . Smoking status: Former Smoker -- 0.50 packs/day for 36 years    Types: Cigarettes    Quit date: 06/12/2014  . Smokeless tobacco: Never Used  . Alcohol Use: No  . Drug Use: No  . Sexual Activity: No   Other Topics Concern  . Not on file   Social History Narrative   Patient currently lives at home with her disabled husband  who was shot in 2009 and he is total care. She lives with her son and daughter and there is an 8 that comes in. Patient currently is on disability   She occasionally drinks caffeine.      Review of Systems    General:  No chills, fever, night sweats or weight changes.  Cardiovascular:  No dyspnea on exertion, edema, orthopnea, palpitations, paroxysmal nocturnal dyspnea. Positive for chest pain.  Dermatological: No rash, lesions/masses Respiratory: No cough, dyspnea Urologic: No hematuria, dysuria Abdominal: No diarrhea, bright red blood per rectum, melena, or hematemesis. Positive for nausea and vomiting. Neurologic:  No visual changes, wkns, changes in mental status. All other systems reviewed and are otherwise negative except as noted above.  Physical Exam    Blood pressure 115/72, pulse 96, temperature 100.4 F (38 C), temperature source Oral, resp. rate 15, SpO2 98 %.  General: Well developed, well nourished,female in no acute distress. Head: Normocephalic, atraumatic, sclera non-icteric, no xanthomas, nares are without discharge. Dentition:  Neck: No carotid bruits. JVD not elevated.  Lungs: Respirations regular and unlabored, without wheezes or rales.  Heart: Regular rate and rhythm. No S3 or S4.  No murmur, no rubs, or gallops appreciated. Abdomen: Soft, non-tender, non-distended with normoactive bowel sounds. No hepatomegaly. No rebound/guarding. No obvious abdominal masses. Msk:  Strength and tone appear normal  for age. No joint deformities or effusions. Extremities: No clubbing or cyanosis. No edema.  Distal pedal pulses are 2+ bilaterally. Neuro: Alert and oriented X 3. Moves all extremities spontaneously. No focal deficits noted. Psych:  Responds to questions appropriately with a normal affect. Skin: No rashes or lesions noted  Labs    Troponin (Point of Care Test)  Recent Labs  10/04/15 1539  TROPIPOC 29.38*   No results for input(s): CKTOTAL, CKMB, TROPONINI in the last 72 hours. Lab Results  Component Value Date   WBC 15.6* 06/24/2015   HGB 16.0* 10/04/2015   HCT 47.0* 10/04/2015   MCV 90.3 06/24/2015   PLT 293 06/24/2015     Recent Labs Lab 10/04/15 1525 10/04/15 1552  NA 134* 136  K 3.7 3.6  CL 100* 100*  CO2 21*  --   BUN 15 17  CREATININE 1.00 0.80  CALCIUM 9.8  --   GLUCOSE 187* 193*   Lab Results  Component Value Date   CHOL 308* 06/12/2015   HDL 52 06/12/2015   LDLCALC 197* 06/12/2015   TRIG 296* 06/12/2015   Lab Results  Component Value Date   DDIMER <0.27 06/13/2015    No results found for: BNP No results found for: PROBNP  Recent Labs  10/04/15 1520  INR 1.23      Radiology Studies    No results found.  EKG & Cardiac Imaging    EKG: Sinus tachycardia, HR 122, ST elevation in the inferior and lateral leads.  ECHOCARDIOGRAM: 06/14/2015 Study Conclusions  - Left ventricle: The cavity size was normal. Wall thickness was  increased in a pattern of mild LVH. Systolic function was normal.  The estimated ejection fraction was in the range of 55% to 60%.  Wall motion was normal; there were no regional wall motion  abnormalities. Doppler parameters are consistent with abnormal  left ventricular relaxation (grade 1 diastolic dysfunction). The  E/e&' ratio is between 8-15, suggesting indeterminate LV filling  pressure. - Left atrium: The atrium was normal in size.  Impressions:  - LVEF 55-60%, mild to moderate LVH, normal wall  motion, diastolic  dysfunction with  indeterminate LV Filling pressure, normal LA  size. Assessment & Plan    1. STEMI - presents with 3 weeks of chest discomfort, present with exertion or at rest. Acutely worsened yesterday afternoon and associated with nausea and vomiting. Took SL NTG without any relief. - reports good compliance with ASA and Plavix following CABG.  - initial EKG shows ST elevation in the inferior and lateral leads. She was therefore taken emergently to the cardiac catheterization lab. Will cycle cardiac enzymes. Further recommendations pending cardiac catheterization results.  2. CAD - s/p CABG on 06/19/2015 w/ LIMA-LAD, SVG-RCA, SVG-OM2. - continue ASA, statin, and BB.  3. Type 2 DM - will hold Metformin for 48 hours following her cath. - SSI while admitted  4. HLD - currently on Simvastatin 40mg  daily. Consider switching to Atorvastatin 80mg  daily.   Signed, Erma Heritage, [name] 10/04/2015, 5:11 PM Pager: [phone]  I have examined the patient and reviewed assessment and plan and discussed with patient.  Agree with above as stated.  I personally examined the patient and reviewed her ECG and made the decision for her to have emergent cardiac cath. SVG to RCA and SVG to OM were occluded.  PCI of distal RCA was performed.  THere is a large distal OM which is occluded.   We were unable to cross this occlusion with a wire due to a side branch at the occlusion.  Continue aggressive secondary prevention.  Change Plavix to Brilinta.    Larae Grooms

## 2015-10-04 NOTE — Progress Notes (Signed)
[place] responded to Code STEMI. Patient was transferred to Cath lab. I escorted family to [facility] waiting area and informed nurse their location. I will be available for follow up as needed.  Darene Lamer Meliza Kage    10/04/15 1600  Clinical Encounter Type  Visited With Patient;Family  Visit Type Initial;Code;ED;Trauma (STEMI)  Referral From Nurse;Physician  Spiritual Encounters  Spiritual Needs Prayer;Emotional

## 2015-10-04 NOTE — ED Notes (Addendum)
Cp and left arm pain x 3 weeks  Nauseous no vomiting, took some nitro earlier in the week. Hurts to yawn and cough, pt states  .Bypass  Surgery 06/19/15

## 2015-10-04 NOTE — Progress Notes (Signed)
ANTICOAGULATION CONSULT NOTE - Initial Consult  Pharmacy Consult for Heparin Indication: chest pain/ACS  No Known Allergies  Patient Measurements:   Heparin Dosing Weight: 83 kg  Vital Signs: Temp: 100.4 F (38 C) (07/20 1523) Temp Source: Oral (07/20 1523) BP: 111/64 mmHg (07/20 1523) Pulse Rate: 114 (07/20 1523)  Labs: No results for input(s): HGB, HCT, PLT, APTT, LABPROT, INR, HEPARINUNFRC, HEPRLOWMOCWT, CREATININE, CKTOTAL, CKMB, TROPONINI in the last 72 hours.  CrCl cannot be calculated (Unknown ideal weight.).   Medical History: Past Medical History  Diagnosis Date  . Hypertension   . Lupus ([place]) dx'd 2008    "they think I have this; have to get retested" (06/12/2015)  . 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 Empty sella ([place])   . Hidradenitis axillaris   . Obesity (BMI 30.0-34.9)   . Ovarian cyst   . Scoliosis   . Hyperlipemia   . Anxiety and depression   . Tobacco abuse   . Kidney stones   . PONV (postoperative nausea and vomiting)   . Anxiety   . Pneumonia X 2  . Type II diabetes mellitus ([place])   . Rheumatoid arthritis ([place])   . Stroke [facility]) March 2014    left MCA branches; "they say I have them all the time", denies residual on 06/12/2015    Medications:   (Not in a hospital admission) Scheduled:  . metoprolol  5 mg Intravenous Once   Infusions:  . sodium chloride    . sodium chloride      Assessment: 55yo female presents with CP and L arm pain for 3 weeks. Pharmacy is consulted to dose heparin for ACS/chest pain.  Goal of Therapy:  Heparin level 0.3-0.7 units/ml Monitor platelets by anticoagulation protocol: Yes   Plan:  Give 4000 units bolus x 1 Start heparin infusion at 1150 units/hr Check anti-Xa level in 6 hours and daily while on heparin Continue to monitor H&H and platelets  Andrey Cota. Diona Foley, PharmD, [place] Clinical Pharmacist Pager [phone] 10/04/2015,3:37 PM

## 2015-10-04 NOTE — ED Provider Notes (Signed)
CSN: [id number]     Arrival date & time 10/04/15  1514 History   First MD Initiated Contact with Patient 10/04/15 1532     Chief Complaint  Patient presents with  . Chest Pain  . Arm Pain     (Consider location/radiation/quality/duration/timing/severity/associated sxs/prior Treatment) Patient is a 55 y.o. female presenting with chest pain and arm pain. The history is provided by the patient.  Chest Pain Pain location:  Substernal area Pain quality: pressure   Pain radiates to:  L arm Pain radiates to the back: no   Pain severity:  Severe Onset quality:  Gradual Duration:  3 weeks Timing:  Constant Progression:  Worsening Chronicity:  New Associated symptoms: nausea   Associated symptoms: no palpitations   Arm Pain Associated symptoms include chest pain and nausea.    Past Medical History  Diagnosis Date  . Hypertension   . Lupus ([place]) dx'd 2008    "they think I have this; have to get retested" (06/12/2015)  . 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 Empty sella ([place])   . Hidradenitis axillaris   . Obesity (BMI 30.0-34.9)   . Ovarian cyst   . Scoliosis   . Hyperlipemia   . Anxiety and depression   . Tobacco abuse   . Kidney stones   . PONV (postoperative nausea and vomiting)   . Anxiety   . Pneumonia X 2  . Type II diabetes mellitus ([place])   . Rheumatoid arthritis ([place])   . Stroke [facility]) March 2014    left MCA branches; "they say I have them all the time", denies residual on 06/12/2015   Past Surgical History  Procedure Laterality Date  . Tonsillectomy  1960s  . Breast biopsy Bilateral   . Dilation and curettage of uterus    . Abdominal hysterectomy    . Tubal ligation    . Cardiac catheterization N/A 06/13/2015    Procedure: Left Heart Cath and Coronary Angiography;  Surgeon: Peter M Martinique, MD;  Location: [place] [facility];  Service: Cardiovascular;  Laterality: N/A;  . Coronary artery bypass graft N/A 06/19/2015    Procedure: CORONARY ARTERY BYPASS GRAFTING (CABG) TIMES  FOUR USING LEFT INTERNAL MAMMARY, RIGHT SAPHENOUS LEG VEIN AND CRYO SAPHENOUS VEIN;  Surgeon: [name], MD;  Location: [facility];  Service: Open Heart Surgery;  Laterality: N/A;  . Tee without cardioversion N/A 06/19/2015    Procedure: TRANSESOPHAGEAL ECHOCARDIOGRAM (TEE);  Surgeon: [name], MD;  Location: [place];  Service: Open Heart Surgery;  Laterality: N/A;   Family History  Problem Relation Age of Onset  . Diabetes Mother   . Heart failure Mother   . Stroke Maternal Aunt   . Diabetes      maternal  . Breast cancer    . Alzheimer's disease      maternal   Social History  Substance Use Topics  . Smoking status: Former Smoker -- 0.50 packs/day for 36 years    Types: Cigarettes    Quit date: 06/12/2014  . Smokeless tobacco: Never Used  . Alcohol Use: No   OB History    No data available     Review of Systems  Unable to perform ROS: Acuity of condition  Cardiovascular: Positive for chest pain. Negative for palpitations and leg swelling.  Gastrointestinal: Positive for nausea.      Allergies  Review of patient's allergies indicates no known allergies.  Home Medications   Prior to Admission medications   Medication Sig Start Date  End Date Taking? Authorizing Provider  aspirin EC 81 MG EC tablet Take 1 tablet (81 mg total) by mouth daily. 06/25/15   Donielle Liston Alba, [name]  Cholecalciferol (VITAMIN D3) 50000 units CAPS Take 50,000 Units by mouth once a week. 04/09/15   Historical Provider, MD  clopidogrel (PLAVIX) 75 MG tablet Take 75 mg by mouth at bedtime. 07/06/12   Penni Bombard, MD  INVOKANA 300 MG TABS tablet Take 300 mg by mouth daily. 06/05/15   Historical Provider, MD  Iron-Vitamins (GERITOL COMPLETE PO) Take 1 tablet by mouth daily.    Historical Provider, MD  JANUVIA 100 MG tablet Take 100 mg by mouth daily. 04/06/15   Historical Provider, MD  metFORMIN (GLUCOPHAGE) 1000 MG tablet Take 1,000 mg by mouth 2 (two) times daily. 04/06/15   Historical  Provider, MD  metoprolol tartrate (LOPRESSOR) 25 MG tablet Take 0.5 tablets (12.5 mg total) by mouth 2 (two) times daily. 06/25/15   Donielle Liston Alba, [name]  nitroGLYCERIN (NITROSTAT) 0.4 MG SL tablet Place 1 tablet (0.4 mg total) under the tongue every 5 (five) minutes as needed for chest pain. MAX 3 doses 10/03/15   Peter M Martinique, MD  Olmesartan-Amlodipine-HCTZ 20-5-12.5 MG TABS Take 1 tablet by mouth daily. Patient taking differently: Take 0.5 tablets by mouth daily.  06/25/15   Donielle Liston Alba, [name]  promethazine (PHENERGAN) 12.5 MG tablet Take 1 tablet (12.5 mg total) by mouth every 6 (six) hours as needed for nausea or vomiting. 06/25/15   Melrose Nakayama, MD  simvastatin (ZOCOR) 40 MG tablet Take 40 mg by mouth daily. 04/10/15   Historical Provider, MD  traMADol (ULTRAM) 50 MG tablet Take 1 tablet (50 mg total) by mouth every 6 (six) hours as needed. 09/10/15   Grace Isaac, MD   BP 115/72 mmHg  Pulse 96  Temp(Src) 100.4 F (38 C) (Oral)  Resp 15  SpO2 98% Physical Exam  Constitutional: She is oriented to person, place, and time. She appears well-developed and well-nourished. No distress.  HENT:  Head: Normocephalic and atraumatic.  Eyes: Conjunctivae and EOM are normal. Pupils are equal, round, and reactive to light.  Neck: Normal range of motion. Neck supple.  Cardiovascular: Regular rhythm and normal heart sounds.  Tachycardia present.   Pulses:      Radial pulses are 2+ on the right side, and 2+ on the left side.  Pulmonary/Chest: Effort normal and breath sounds normal. No respiratory distress.  Abdominal: Soft. Bowel sounds are normal. There is no tenderness.  Musculoskeletal: Normal range of motion.  Neurological: She is alert and oriented to person, place, and time. She has normal reflexes. No cranial nerve deficit.  Skin: Skin is warm and dry. She is not diaphoretic.  Psychiatric: She has a normal mood and affect.    ED Course  Procedures (including  critical care time) Labs Review Labs Reviewed  POCT I-STAT TROPONIN I - Abnormal; Notable for the following:    Troponin i, poc 29.38 (*)    All other components within normal limits  BASIC METABOLIC PANEL  CBC  PROTIME-INR  HEPARIN LEVEL (UNFRACTIONATED)  I-STAT TROPOININ, ED  I-STAT TROPOININ, ED    Imaging Review No results found. I have personally reviewed and evaluated these images and lab results as part of my medical decision-making.   EKG Interpretation   Date/Time:  Thursday October 04 2015 15:21:54 EDT Ventricular Rate:  122 PR Interval:  160 QRS Duration: 94 QT Interval:  342 QTC Calculation: 487  R Axis:   81 Text Interpretation:   Critical Test Result: STEMI  AGE AND GENDER  SPECIFIC ECG ANALYSIs Sinus tachycardia with occasional Premature  ventricular complexes Possible Left atrial enlargement Inferior-posterior  infarct , possibly acute Anterolateral infarct , possibly acute ** **  ACUTE MI / STEMI ** ** Consider right ventricular involvement in acute  inferior infarct Abnormal ECG Confirmed by BELFI  MD, MELANIE [id number]) on  10/04/2015 3:27:16 PM Also confirmed by Jeneen Rinks  MD, [place] ([id number])  on  10/04/2015 3:34:03 PM      MDM   Final diagnoses:  ST elevation myocardial infarction (STEMI), unspecified artery [facility])    Patient is a 55 year old female with a history of CABG 3 months prior presented today for chest pain. Reports increasing nausea and chest pain over the last 3 weeks not radiate into her left arm. Call cardiologist to advise come to the emergency part.  On arrival found to have inferior lateral STEMI. No hypotension but tachycardic. Bolus started heparin bolusm aspirin, and 5 of Lopressor given. Pads placed on patient.  Cardiology was immediately at bedside. Patient subsequently taken to the Cath Lab without further acute events in the emergency department.  ECG were viewed by myself and incorporated into medical decision making.  Discussed pertinent  finding with patient or caregiver prior to admission with no further questions.  Pt care supervised by my attending Dr. Danella Sensing, MD PGY-3 Emergency Medicine     Geronimo Boot, MD 10/04/15 [place], MD 10/04/15 1600

## 2015-10-05 ENCOUNTER — Inpatient Hospital Stay (HOSPITAL_COMMUNITY): Payer: Commercial Managed Care - HMO

## 2015-10-05 ENCOUNTER — Encounter (HOSPITAL_COMMUNITY): Payer: Self-pay | Admitting: Interventional Cardiology

## 2015-10-05 DIAGNOSIS — I10 Essential (primary) hypertension: Secondary | ICD-10-CM

## 2015-10-05 DIAGNOSIS — I2510 Atherosclerotic heart disease of native coronary artery without angina pectoris: Secondary | ICD-10-CM

## 2015-10-05 DIAGNOSIS — E119 Type 2 diabetes mellitus without complications: Secondary | ICD-10-CM

## 2015-10-05 DIAGNOSIS — E785 Hyperlipidemia, unspecified: Secondary | ICD-10-CM

## 2015-10-05 DIAGNOSIS — Z951 Presence of aortocoronary bypass graft: Secondary | ICD-10-CM

## 2015-10-05 LAB — BASIC METABOLIC PANEL WITH GFR
Anion gap: 10 (ref 5–15)
BUN: 14 mg/dL (ref 6–20)
CO2: 26 mmol/L (ref 22–32)
Calcium: 9.4 mg/dL (ref 8.9–10.3)
Chloride: 102 mmol/L (ref 101–111)
Creatinine, Ser: 0.89 mg/dL (ref 0.44–1.00)
GFR calc Af Amer: >60 mL/min (ref >60)
GFR calc non Af Amer: >60 mL/min (ref >60)
Glucose, Bld: 167 mg/dL — ABNORMAL HIGH (ref 65–99)
Potassium: 3.4 mmol/L — ABNORMAL LOW (ref 3.5–5.1)
Sodium: 138 mmol/L (ref 135–145)

## 2015-10-05 LAB — GLUCOSE, CAPILLARY
Glucose-Capillary: 151 mg/dL — ABNORMAL HIGH (ref 65–99)
Glucose-Capillary: 151 mg/dL — ABNORMAL HIGH (ref 65–99)
Glucose-Capillary: 169 mg/dL — ABNORMAL HIGH (ref 65–99)
Glucose-Capillary: 170 mg/dL — ABNORMAL HIGH (ref 65–99)

## 2015-10-05 LAB — LIPID PANEL
Cholesterol: 160 mg/dL (ref 0–200)
HDL: 56 mg/dL (ref >40)
LDL Cholesterol: 76 mg/dL (ref 0–99)
Total CHOL/HDL Ratio: 2.9 RATIO
Triglycerides: 142 mg/dL (ref <150)
VLDL: 28 mg/dL (ref 0–40)

## 2015-10-05 LAB — TROPONIN I
Troponin I: 13.47 ng/mL (ref <0.03)
Troponin I: 14.97 ng/mL (ref <0.03)
Troponin I: 12.98 ng/mL (ref <0.03)

## 2015-10-05 LAB — POCT ACTIVATED CLOTTING TIME: Activated Clotting Time: 428 seconds

## 2015-10-05 MED ORDER — ISOSORBIDE MONONITRATE ER 30 MG PO TB24
15.0000 mg | ORAL_TABLET | Freq: Every day | ORAL | Status: DC
  Administered 2015-10-05 – 2015-10-08 (×4): 15 mg via ORAL
  Filled 2015-10-05 (×4): qty 1

## 2015-10-05 MED ORDER — METOPROLOL TARTRATE 25 MG PO TABS
25.0000 mg | ORAL_TABLET | Freq: Two times a day (BID) | ORAL | Status: DC
  Administered 2015-10-05 – 2015-10-06 (×4): 25 mg via ORAL
  Filled 2015-10-05 (×4): qty 1

## 2015-10-05 MED ORDER — POTASSIUM CHLORIDE CRYS ER 20 MEQ PO TBCR
40.0000 meq | EXTENDED_RELEASE_TABLET | Freq: Once | ORAL | Status: AC
  Administered 2015-10-05: 40 meq via ORAL
  Filled 2015-10-05: qty 2

## 2015-10-05 MED ORDER — ATORVASTATIN CALCIUM 80 MG PO TABS
80.0000 mg | ORAL_TABLET | Freq: Every day | ORAL | Status: DC
  Administered 2015-10-05 – 2015-10-07 (×3): 80 mg via ORAL
  Filled 2015-10-05 (×3): qty 1

## 2015-10-05 MED FILL — Bivalirudin For IV Soln 250 MG: INTRAVENOUS | Qty: 250 | Status: AC

## 2015-10-05 MED FILL — Sodium Chloride IV Soln 0.9%: INTRAVENOUS | Qty: 50 | Status: AC

## 2015-10-05 MED FILL — Verapamil HCl IV Soln 2.5 MG/ML: INTRAVENOUS | Qty: 2 | Status: AC

## 2015-10-05 NOTE — Progress Notes (Signed)
TELEMETRY: Reviewed telemetry pt in NSR: Filed Vitals:   10/05/15 0403 10/05/15 0500 10/05/15 0600 10/05/15 0732  BP: 112/74 108/66 101/61 111/71  Pulse: 92 95 89 86  Temp: 98.9 F (37.2 C)   98.7 F (37.1 C)  TempSrc: Oral   Oral  Resp: 29 22 21 21   Height:      Weight: 204 lb 12.9 oz (92.9 kg)     SpO2: 93% 96% 95% 93%    Intake/Output Summary (Last 24 hours) at 10/05/15 0752 Last data filed at 10/05/15 0400  Gross per 24 hour  Intake 889.05 ml  Output    500 ml  Net 389.05 ml   Filed Weights   10/04/15 1741 10/05/15 0403  Weight: 202 lb 13.2 oz (92 kg) 204 lb 12.9 oz (92.9 kg)    Subjective Still having some mild chest and left shoulder pain. Groin is sore when she coughs.  . sodium chloride   Intravenous Once  . aspirin EC  81 mg Oral Daily  . heparin  5,000 Units Subcutaneous Q8H  . insulin aspart  0-15 Units Subcutaneous TID WC  . linagliptin  5 mg Oral Daily  . metoprolol tartrate  12.5 mg Oral BID  . simvastatin  40 mg Oral Daily  . sodium chloride flush  3 mL Intravenous Q12H  . ticagrelor  90 mg Oral BID      LABS: Basic Metabolic Panel:  Recent Labs  10/04/15 1525 10/04/15 1552 10/05/15 0305  NA 134* 136 138  K 3.7 3.6 3.4*  CL 100* 100* 102  CO2 21*  --  26  GLUCOSE 187* 193* 167*  BUN 15 17 14   CREATININE 1.00 0.80 0.89  CALCIUM 9.8  --  9.4   Liver Function Tests: No results for input(s): AST, ALT, ALKPHOS, BILITOT, PROT, ALBUMIN in the last 72 hours. No results for input(s): LIPASE, AMYLASE in the last 72 hours. CBC:  Recent Labs  10/04/15 1552  HGB 16.0*  HCT 47.0*   Cardiac Enzymes: No results for input(s): CKTOTAL, CKMB, CKMBINDEX, TROPONINI in the last 72 hours. BNP: No results for input(s): PROBNP in the last 72 hours. D-Dimer: No results for input(s): DDIMER in the last 72 hours. Hemoglobin A1C: No results for input(s): HGBA1C in the last 72 hours. Fasting Lipid Panel:  Recent Labs  10/05/15 0305  CHOL 160    HDL 56  LDLCALC 76  TRIG 142  CHOLHDL 2.9   Thyroid Function Tests: No results for input(s): TSH, T4TOTAL, T3FREE, THYROIDAB in the last 72 hours.  Invalid input(s): FREET3   Radiology/Studies:  No results found.   Ecg today shows NSR with Q waves inferiorly and persistent ST elevation in the inferior leads.   Procedures    Coronary Stent Intervention   Left Heart Cath and Coronary Angiography    Conclusion     Prox LAD lesion, 85% stenosed. Patent LIMA to LAD.  Severe native 3 vessel CAD.  Lat 1st Mrg-2 lesion, 100% stenosed. Occluded SVG to OM.  Ost RCA to Mid RCA lesion, 70% stenosed. Ocluded SVG to RCA,  Dist RCA lesion, 99% stenosed. Post intervention with a 2.25 x 12 Promus Premier, there is a 0% residual stenosis.  Normal LVEDP.  Switch to Brilinta from Plavix. She needs aggressive secondary prevention. Patient with residual ST elevation in the inferior leads but her chest pain has improved significantly. Proximal RCA disease did not seem critical, compared to distal lesion.      Indications  Acute MI, inferior wall, initial episode of care ([place]) [I21.19 (ICD-10-CM)]    Technique and Indications    The risks, benefits, and details of the procedure were explained to the patient. The patient verbalized understanding and wanted to proceed. Informed written consent was obtained.  PROCEDURE TECHNIQUE: After Xylocaine anesthesia, a 4F sheath was placed in the right femoral artery with a single anterior needle wall stick. Left coronary angiography was done using a Judkins L4 guide catheter. Right coronary angiography was done using a Judkins R4 guide catheter. Left heart cath was done using a RCB catheter. The JR4, RCB nad AL1 were used for the SVGs. The IMA was engaged with the [place].   We first attempted to cross the OM. We were unable to cross due to a branch right at the occlusion. THen the PCI of the distal RCA , through a very tortuous vessel was  performed. IC NTG was given. Angioseal for hemostasis. .     Contrast: 220 cc  During this procedure the patient is administered a total of Versed 2 mg and Fentanyl 75 mg to achieve and maintain moderate conscious sedation. The patient's heart rate, blood pressure, and oxygen saturation are monitored continuously during the procedure. The period of conscious sedation is 76 minutes, of which I was present face-to-face 100% of this time.  Estimated blood loss <50 mL. There were no immediate complications during the procedure.    Coronary Findings    Dominance: Right   Left Main  . Vessel is normal in caliber. Vessel is angiographically normal.     Left Anterior Descending   . Prox LAD lesion, 85% stenosed. Discrete eccentric. Hazy     Left Circumflex   . First Obtuse Marginal Branch   The vessel is large in size.   . Lateral First Obtuse Marginal Branch   The vessel is large in size.   . Lat 1st Mrg-1 lesion, 90% stenosed. Discrete.   . Lat 1st Mrg-2 lesion, 100% stenosed.     Right Coronary Artery  . Vessel is normal in caliber. The RCA arises anteriorly   . Ost RCA to Mid RCA lesion, 70% stenosed. Diffuse.   . Dist RCA lesion, 99% stenosed.   Marland Kitchen PCI: The pre-interventional distal flow is normal (TIMI 3). Pre-stent angioplasty was performed. A drug-eluting stent was placed. The strut is apposed. No post-stent angioplasty was performed. The post-interventional distal flow is normal (TIMI 3). The intervention was successful. No complications occurred at this lesion. First attempted to cross OM occlusion but prowater and fielder would not cross.  . Supplies used: BALLOON EMERGE MR 2.0X12; STENT PROMUS PREM MR [id number]; ANGIOSEAL 6FR  . There is no residual stenosis post intervention.       Graft Angiography    LIMA Graft to Mid LAD  is large, and is anatomically normal.     Free Graft to RPDA  SVG There is severe disease in the graft.   . Origin lesion, 100% stenosed.       Free Graft to Lat 1st Mrg  SVG There is severe disease in the graft.   . Origin to Prox Graft lesion, 100% stenosed.          Left Heart    Aortic Valve There is no aortic valve stenosis.    Coronary Diagrams    Diagnostic Diagram           Post-Intervention Diagram            PHYSICAL EXAM General:  Well developed, well nourished, in no acute distress. Head: Normocephalic, atraumatic, sclera non-icteric, oropharynx is clear Neck: Negative for carotid bruits. JVD not elevated. No adenopathy Lungs: Clear bilaterally to auscultation without wheezes, rales, or rhonchi. Breathing is unlabored. Heart: RRR S1 S2 without murmurs, rubs, or gallops.  Abdomen: Soft, non-tender, non-distended with normoactive bowel sounds. No hepatomegaly. No rebound/guarding. No obvious abdominal masses. Msk:  Strength and tone appears normal for age. Extremities: No clubbing, cyanosis or edema.  Distal pedal pulses are 2+ and equal bilaterally. Right groin without hematoma or bruit. Neuro: Alert and oriented X 3. Moves all extremities spontaneously. Psych:  Responds to questions appropriately with a normal affect.  ASSESSMENT AND PLAN: 1. Acute inferior STEMI. S/p DES of distal RCA. Patient with early graft failure s/p CABG in April. SVG to OM and SVG to RCA occluded. LIMA to LAD patent. OM 1 now occluded. Dr Irish Lack tried to cross with wire without success. Distal RCA successfully stented. The proximal to mid RCA is diffusely diseased to 70%. Still having low grade chest pain. ST elevation persists post PCI. Will check Echo. Repeat troponin levels. Will need to intensify medical therapy. Increase metoprolol. Add oral nitrates. Change to high dose lipitor. Continue ASA and Brilinta. Will hold Tribenzor with low BP to allow titration of antianginal therapy. Keep in ICU today. 2. DM on Januvia and metformin at home. Metformin now on hold. On SSI. 3. Hypercholesterolemia. Switch to lipitor 80  mg daily. 4. HTN controlled. 5. Former smoker- quit March 2016.  6. Hypokalemia- will replete.   Present on Admission:  . STEMI (ST elevation myocardial infarction) ([place]) . Acute MI, inferior wall, initial episode of care ([place]) . Acute ST elevation myocardial infarction (STEMI) ([place]) . Dyslipidemia-LDL 197 . HTN (hypertension)  Signed, [name], [place] 10/05/2015 7:52 AM

## 2015-10-05 NOTE — Care Management Note (Signed)
Case Management Note  Patient Details  Name: [name] [name] MRN: [id number] Date of Birth: 02-Aug-1960  Subjective/Objective:                    Action/Plan:  Consult for Brilinta , 30 day free card given to nurse  . Patient in rest room   Entered benefits check and asked weekend case manager to be included  Expected Discharge Date:                  Expected Discharge Plan:  Home/Self Care  In-House Referral:     Discharge planning Services  CM Consult, Medication Assistance  Post Acute Care Choice:    Choice offered to:     DME Arranged:    DME Agency:     HH Arranged:    HH Agency:     Status of Service:  In process, will continue to follow  If discussed at Long Length of Stay Meetings, dates discussed:    Additional Comments:  Marilu Favre, RN 10/05/2015, 4:18 PM

## 2015-10-05 NOTE — Progress Notes (Addendum)
CARDIAC REHAB PHASE I   PRE:  Rate/Rhythm:94 SR  BP:  Sitting: 105/66        SaO2: 97 RA  MODE:  Ambulation: 270 ft   POST:  Rate/Rhythm: 95 SR  BP:  Sitting: 101/65         SaO2: 98 RA  Pt last seen by cardiac rehab in April 2017. Pt ambulated 270 ft on RA, handheld assist, steady gait, tolerated well.  Pt c/o of mild DOE, denies cp, dizziness, declined rest stop. Completed MI/stent education. Reviewed risk factors, MI book, anti-platelet therapy, stent card, activity restrictions, ntg, exercise, heart healthy diet, carb counting, portion control, and phase 2 cardiac rehab. Pt verbalized understanding, receptive to education. Encouraged ambulation as tolerated. Pt agrees to phase 2 cardiac rehab referral, will send to [facility] per pt request.  Pt to see case manager regarding brilinta prior to discharge. Pt to bed after walk, call bell within reach. Will follow.   BD:[id number]  Lenna Sciara, RN, BSN 10/05/2015 3:01 PM

## 2015-10-05 NOTE — Progress Notes (Signed)
  Echocardiogram 2D Echocardiogram has been performed.  [name] [name] 10/05/2015, 5:51 PM

## 2015-10-05 NOTE — Care Management Important Message (Signed)
Important Message  Patient Details  Name: [name] [name] MRN: [id number] Date of Birth: 1960-08-17   Medicare Important Message Given:  Yes    Loann Quill 10/05/2015, 8:16 AM

## 2015-10-05 NOTE — Progress Notes (Signed)
EKG CRITICAL VALUE     12 lead EKG performed.  Critical value noted.  Lanelle Bal, RN notified.   Martie Lee, CCT 10/05/2015 6:42 AM

## 2015-10-06 LAB — BASIC METABOLIC PANEL
Anion gap: 10 (ref 5–15)
BUN: 12 mg/dL (ref 6–20)
CO2: 22 mmol/L (ref 22–32)
Calcium: 9.2 mg/dL (ref 8.9–10.3)
Chloride: 105 mmol/L (ref 101–111)
Creatinine, Ser: 0.82 mg/dL (ref 0.44–1.00)
GFR calc Af Amer: >60 mL/min (ref >60)
GFR calc non Af Amer: >60 mL/min (ref >60)
Glucose, Bld: 171 mg/dL — ABNORMAL HIGH (ref 65–99)
Potassium: 4 mmol/L (ref 3.5–5.1)
Sodium: 137 mmol/L (ref 135–145)

## 2015-10-06 LAB — ECHOCARDIOGRAM COMPLETE
E decel time: 172 msec
E/e' ratio: 9.81
FS: 15 % — AB (ref 28–44)
Height: 67 in
IVS/LV PW RATIO, ED: 0.93
LA ID, A-P, ES: 35 mm
LA diam end sys: 35 mm
LA diam index: 1.72 cm/m2
LA vol A4C: 37.4 ml
LA vol index: 21 mL/m2
LA vol: 42.9 mL
LV E/e' medial: 9.81
LV E/e'average: 9.81
LV PW d: 10.7 mm — AB (ref 0.6–1.1)
LV e' LATERAL: 7.51 cm/s
LVOT area: 2.84 cm2
LVOT diameter: 19 mm
Lateral S' vel: 10.8 cm/s
MV Dec: 172
MV Peak grad: 2 mmHg
MV pk A vel: 104 m/s
MV pk E vel: 73.7 m/s
TAPSE: 11.9 mm
TDI e' lateral: 7.51
TDI e' medial: 8.27
Weight: 3276.92 oz

## 2015-10-06 LAB — GLUCOSE, CAPILLARY
Glucose-Capillary: 159 mg/dL — ABNORMAL HIGH (ref 65–99)
Glucose-Capillary: 147 mg/dL — ABNORMAL HIGH (ref 65–99)
Glucose-Capillary: 129 mg/dL — ABNORMAL HIGH (ref 65–99)
Glucose-Capillary: 125 mg/dL — ABNORMAL HIGH (ref 65–99)

## 2015-10-06 NOTE — Progress Notes (Signed)
CARDIAC REHAB PHASE I   PRE:  Rate/Rhythm: 87 SR  BP:   Sitting: 110/73     SaO2: 97% RA  MODE:  Ambulation: 350 ft   POST:  Rate/Rhythm: 91 SR  BP:   Sitting: 115/75     SaO2: 97% RA  1135-1204  Pt ambulated 344ft independently and with a steady gait. Pt denies SOB, dizziness, lightheadedness and CP. Returned pt to recliner with LE elevated and VSS. Call bell in reach and further discussion about CRPII at [facility].   [name] [name] D Jerrine Urschel,MS,ACSM-RCEP 10/06/2015 12:01 PM

## 2015-10-06 NOTE — Progress Notes (Signed)
DAILY PROGRESS NOTE  Subjective:  Symptoms improved somewhat overnight. Still with low grade chest pain and inferior ST elevation. Troponin peaked at 14.97 and trended down. Tachycardic this morning. Will review echo that was performed yesterday.  Objective:  Temp:  [98.2 F (36.8 C)-98.9 F (37.2 C)] 98.2 F (36.8 C) (07/22 0800) Pulse Rate:  [72-106] 83 (07/22 0800) Resp:  [16-27] 20 (07/21 2022) BP: (89-131)/(50-78) 107/61 mmHg (07/22 0800) SpO2:  [89 %-99 %] 97 % (07/22 0800) Weight:  [204 lb 12.9 oz (92.9 kg)] 204 lb 12.9 oz (92.9 kg) (07/22 0600) Weight change: 1 lb 15.8 oz (0.9 kg)  Intake/Output from previous day: 07/21 0701 - 07/22 0700 In: 680 [P.O.:680] Out: -   Intake/Output from this shift:    Medications: Current Facility-Administered Medications  Medication Dose Route Frequency Provider Last Rate Last Dose  . 0.9 %  sodium chloride infusion   Intravenous Once Tanna Furry, MD   0  at 10/04/15 1530  . 0.9 %  sodium chloride infusion  250 mL Intravenous PRN [name], MD      . acetaminophen (TYLENOL) tablet 650 mg  650 mg Oral Q4H PRN [name], MD   650 mg at 10/05/15 1808  . aspirin EC tablet 81 mg  81 mg Oral Daily Erma Heritage, [place]   81 mg at 10/05/15 1058  . atorvastatin (LIPITOR) tablet 80 mg  80 mg Oral q1800 Peter M Martinique, MD   80 mg at 10/05/15 1808  . heparin injection 5,000 Units  5,000 Units Subcutaneous Q8H [name], MD   5,000 Units at 10/04/15 2105  . insulin aspart (novoLOG) injection 0-15 Units  0-15 Units Subcutaneous TID [facility] Erma Heritage, [place]   3 Units at 10/06/15 [phone]  . isosorbide mononitrate (IMDUR) 24 hr tablet 15 mg  15 mg Oral Daily Peter M Martinique, MD   15 mg at 10/05/15 1058  . linagliptin (TRADJENTA) tablet 5 mg  5 mg Oral Daily Erma Heritage, [place]   5 mg at 10/05/15 1057  . metoprolol tartrate (LOPRESSOR) tablet 25 mg  25 mg Oral BID Peter M Martinique, MD   25 mg at 10/05/15 2117  .  nitroGLYCERIN (NITROSTAT) SL tablet 0.4 mg  0.4 mg Sublingual Q5 Min x 3 PRN Erma Heritage, [place]      . ondansetron [facility]) injection 4 mg  4 mg Intravenous Q6H PRN [name], MD   4 mg at 10/04/15 2025  . sodium chloride flush (NS) 0.9 % injection 3 mL  3 mL Intravenous Q12H [name], MD   3 mL at 10/05/15 2200  . sodium chloride flush (NS) 0.9 % injection 3 mL  3 mL Intravenous PRN [name], MD      . ticagrelor [facility]) tablet 90 mg  90 mg Oral BID [name], MD   90 mg at 10/05/15 2117  . traZODone (DESYREL) tablet 50 mg  50 mg Oral QHS PRN Cristina Gong, MD   50 mg at 10/04/15 2146    Physical Exam: General appearance: alert and no distress Neck: no carotid bruit and no JVD Lungs: clear to auscultation bilaterally Heart: regular rate and rhythm, S1, S2 normal, no murmur, click, rub or gallop Abdomen: soft, non-tender; bowel sounds normal; no masses,  no organomegaly Extremities: extremities normal, atraumatic, no cyanosis or edema Pulses: 2+ and symmetric Skin: Skin color, texture, turgor normal. No rashes or lesions Neurologic: Grossly normal  Psych: Pleasant  Lab Results: Results for orders placed or performed during the hospital encounter of 10/04/15 (from the past 48 hour(s))  Protime-INR     Status: Abnormal   Collection Time: 10/04/15  3:20 PM  Result Value Ref Range   Prothrombin Time 15.7 (H) 11.6 - 15.2 seconds   INR 1.23 0.00 - 1.49  Basic metabolic panel     Status: Abnormal   Collection Time: 10/04/15  3:25 PM  Result Value Ref Range   Sodium 134 (L) 135 - 145 mmol/L   Potassium 3.7 3.5 - 5.1 mmol/L   Chloride 100 (L) 101 - 111 mmol/L   CO2 21 (L) 22 - 32 mmol/L   Glucose, Bld 187 (H) 65 - 99 mg/dL   BUN 15 6 - 20 mg/dL   Creatinine, Ser 7.31 0.44 - 1.00 mg/dL   Calcium 9.8 8.9 - 06.5 mg/dL   GFR calc non Af Amer >60 >60 mL/min   GFR calc Af Amer >60 >60 mL/min    Comment: (NOTE) The eGFR has been calculated using  the CKD EPI equation. This calculation has not been validated in all clinical situations. eGFR's persistently <60 mL/min signify possible Chronic Kidney Disease.    Anion gap 13 5 - 15  POCT i-Stat troponin I     Status: Abnormal   Collection Time: 10/04/15  3:39 PM  Result Value Ref Range   Troponin i, poc 29.38 (HH) 0.00 - 0.08 ng/mL   Comment NOTIFIED PHYSICIAN    Comment 3            Comment: Due to the release kinetics of cTnI, a negative result within the first hours of the onset of symptoms does not rule out myocardial infarction with certainty. If myocardial infarction is still suspected, repeat the test at appropriate intervals.   I-STAT, chem 8     Status: Abnormal   Collection Time: 10/04/15  3:52 PM  Result Value Ref Range   Sodium 136 135 - 145 mmol/L   Potassium 3.6 3.5 - 5.1 mmol/L   Chloride 100 (L) 101 - 111 mmol/L   BUN 17 6 - 20 mg/dL   Creatinine, Ser 3.28 0.44 - 1.00 mg/dL   Glucose, Bld 671 (H) 65 - 99 mg/dL   Calcium, Ion 8.88 (L) 1.13 - 1.30 mmol/L   TCO2 24 0 - 100 mmol/L   Hemoglobin 16.0 (H) 12.0 - 15.0 g/dL   HCT 38.6 (H) 75.2 - 56.5 %  POCT Activated clotting time     Status: None   Collection Time: 10/04/15  4:39 PM  Result Value Ref Range   Activated Clotting Time 428 seconds  MRSA PCR Screening     Status: None   Collection Time: 10/04/15  5:59 PM  Result Value Ref Range   MRSA by PCR NEGATIVE NEGATIVE    Comment:        The GeneXpert MRSA Assay (FDA approved for NASAL specimens only), is one component of a comprehensive MRSA colonization surveillance program. It is not intended to diagnose MRSA infection nor to guide or monitor treatment for MRSA infections.   Glucose, capillary     Status: Abnormal   Collection Time: 10/04/15  9:17 PM  Result Value Ref Range   Glucose-Capillary 198 (H) 65 - 99 mg/dL   Comment 1 Capillary Specimen   Basic metabolic panel     Status: Abnormal   Collection Time: 10/05/15  3:05 AM  Result Value  Ref Range   Sodium 138  135 - 145 mmol/L   Potassium 3.4 (L) 3.5 - 5.1 mmol/L   Chloride 102 101 - 111 mmol/L   CO2 26 22 - 32 mmol/L   Glucose, Bld 167 (H) 65 - 99 mg/dL   BUN 14 6 - 20 mg/dL   Creatinine, Ser 0.89 0.44 - 1.00 mg/dL   Calcium 9.4 8.9 - 10.3 mg/dL   GFR calc non Af Amer >60 >60 mL/min   GFR calc Af Amer >60 >60 mL/min    Comment: (NOTE) The eGFR has been calculated using the CKD EPI equation. This calculation has not been validated in all clinical situations. eGFR's persistently <60 mL/min signify possible Chronic Kidney Disease.    Anion gap 10 5 - 15  Lipid panel     Status: None   Collection Time: 10/05/15  3:05 AM  Result Value Ref Range   Cholesterol 160 0 - 200 mg/dL   Triglycerides 142 <150 mg/dL   HDL 56 >40 mg/dL   Total CHOL/HDL Ratio 2.9 RATIO   VLDL 28 0 - 40 mg/dL   LDL Cholesterol 76 0 - 99 mg/dL    Comment:        Total Cholesterol/HDL:CHD Risk Coronary Heart Disease Risk Table                     Men   Women  1/2 Average Risk   3.4   3.3  Average Risk       5.0   4.4  2 X Average Risk   9.6   7.1  3 X Average Risk  23.4   11.0        Use the calculated Patient Ratio above and the CHD Risk Table to determine the patient's CHD Risk.        ATP III CLASSIFICATION (LDL):  <100     mg/dL   Optimal  100-129  mg/dL   Near or Above                    Optimal  130-159  mg/dL   Borderline  160-189  mg/dL   High  >190     mg/dL   Very High   Glucose, capillary     Status: Abnormal   Collection Time: 10/05/15  7:30 AM  Result Value Ref Range   Glucose-Capillary 151 (H) 65 - 99 mg/dL   Comment 1 Capillary Specimen   Troponin I     Status: Abnormal   Collection Time: 10/05/15  8:08 AM  Result Value Ref Range   Troponin I 13.47 (HH) <0.03 ng/mL    Comment: CRITICAL RESULT CALLED TO, READ BACK BY AND VERIFIED WITH: M.BAILEY,RN 10/05/15 0926 BY BSLADE   Glucose, capillary     Status: Abnormal   Collection Time: 10/05/15 12:23 PM  Result  Value Ref Range   Glucose-Capillary 151 (H) 65 - 99 mg/dL   Comment 1 Capillary Specimen   Troponin I     Status: Abnormal   Collection Time: 10/05/15  1:49 PM  Result Value Ref Range   Troponin I 14.97 (HH) <0.03 ng/mL    Comment: CRITICAL VALUE NOTED.  VALUE IS CONSISTENT WITH PREVIOUSLY REPORTED AND CALLED VALUE.  Glucose, capillary     Status: Abnormal   Collection Time: 10/05/15  3:53 PM  Result Value Ref Range   Glucose-Capillary 169 (H) 65 - 99 mg/dL   Comment 1 Capillary Specimen   Troponin I     Status: Abnormal  Collection Time: 10/05/15  7:40 PM  Result Value Ref Range   Troponin I 12.98 (HH) <0.03 ng/mL    Comment: CRITICAL VALUE NOTED.  VALUE IS CONSISTENT WITH PREVIOUSLY REPORTED AND CALLED VALUE.  Glucose, capillary     Status: Abnormal   Collection Time: 10/05/15 10:05 PM  Result Value Ref Range   Glucose-Capillary 170 (H) 65 - 99 mg/dL   Comment 1 Notify RN   Basic metabolic panel     Status: Abnormal   Collection Time: 10/06/15  3:24 AM  Result Value Ref Range   Sodium 137 135 - 145 mmol/L   Potassium 4.0 3.5 - 5.1 mmol/L   Chloride 105 101 - 111 mmol/L   CO2 22 22 - 32 mmol/L   Glucose, Bld 171 (H) 65 - 99 mg/dL   BUN 12 6 - 20 mg/dL   Creatinine, Ser 0.82 0.44 - 1.00 mg/dL   Calcium 9.2 8.9 - 10.3 mg/dL   GFR calc non Af Amer >60 >60 mL/min   GFR calc Af Amer >60 >60 mL/min    Comment: (NOTE) The eGFR has been calculated using the CKD EPI equation. This calculation has not been validated in all clinical situations. eGFR's persistently <60 mL/min signify possible Chronic Kidney Disease.    Anion gap 10 5 - 15    Imaging: No results found.  Assessment:  1. Principal Problem: 2.   STEMI (ST elevation myocardial infarction) ([place]) 3. Active Problems: 4.   HTN (hypertension) 5.   Dyslipidemia-LDL 197 6.   Diabetes mellitus without complication ([place]) 7.   S/P CABG x 3 8.   Acute MI, inferior wall, initial episode of care ([place]) 9.   Acute ST  elevation myocardial infarction (STEMI) ([place]) 10.   Plan:  1. Improving from STEMI with early loss of SVG to OM (CABG 3 months ago) - successful PCI to distal RCA lesion with a small Promus Premier stent. Now on Brillinta. Still with persistent ST elevation inferiorly and tachycardia. BP low normal. On metoprolol 25 mg BID, imdur 15 mg and now on Lipitor 80 mg daily. Ok to ambulate with CR today - given mild pain and persistent ST changes, will keep in the ICU again today.  Time Spent Directly with Patient:  30 minutes  Length of Stay:  LOS: 2 days   Pixie Casino, MD, [facility] Attending Cardiologist [place] 10/06/2015, 9:08 AM

## 2015-10-07 LAB — BASIC METABOLIC PANEL
Anion gap: 12 (ref 5–15)
BUN: 9 mg/dL (ref 6–20)
CO2: 23 mmol/L (ref 22–32)
Calcium: 9.6 mg/dL (ref 8.9–10.3)
Chloride: 102 mmol/L (ref 101–111)
Creatinine, Ser: 0.84 mg/dL (ref 0.44–1.00)
GFR calc Af Amer: >60 mL/min (ref >60)
GFR calc non Af Amer: >60 mL/min (ref >60)
Glucose, Bld: 164 mg/dL — ABNORMAL HIGH (ref 65–99)
Potassium: 4.5 mmol/L (ref 3.5–5.1)
Sodium: 137 mmol/L (ref 135–145)

## 2015-10-07 LAB — GLUCOSE, CAPILLARY
Glucose-Capillary: 158 mg/dL — ABNORMAL HIGH (ref 65–99)
Glucose-Capillary: 179 mg/dL — ABNORMAL HIGH (ref 65–99)
Glucose-Capillary: 144 mg/dL — ABNORMAL HIGH (ref 65–99)
Glucose-Capillary: 139 mg/dL — ABNORMAL HIGH (ref 65–99)

## 2015-10-07 MED ORDER — METOPROLOL TARTRATE 25 MG PO TABS
37.5000 mg | ORAL_TABLET | Freq: Two times a day (BID) | ORAL | Status: DC
  Administered 2015-10-07 – 2015-10-08 (×2): 37.5 mg via ORAL
  Filled 2015-10-07 (×2): qty 1

## 2015-10-07 NOTE — Progress Notes (Signed)
DAILY PROGRESS NOTE  Subjective:  Feels better today. Echo yesterday shows LVEF 55-60%, inferior hypokinesis and relatively hyperdynamic anterior wall motion. Still with persistent mild ST elevation - mild chest discomfort, more chest wall soreness.  Objective:  Temp:  [97.7 F (36.5 C)-100 F (37.8 C)] 100 F (37.8 C) (07/23 0800) Pulse Rate:  [68-88] 88 (07/23 0600) Resp:  [18] 18 (07/23 0800) BP: (90-185)/(53-165) 111/80 (07/23 0800) SpO2:  [95 %-98 %] 98 % (07/23 0800) Weight:  [205 lb 6.4 oz (93.2 kg)] 205 lb 6.4 oz (93.2 kg) (07/23 0600) Weight change: 9.5 oz (0.269 kg)  Intake/Output from previous day: 07/22 0701 - 07/23 0700 In: 640 [P.O.:640] Out: -   Intake/Output from this shift: No intake/output data recorded.  Medications: Current Facility-Administered Medications  Medication Dose Route Frequency Provider Last Rate Last Dose  . 0.9 %  sodium chloride infusion   Intravenous Once Tanna Furry, MD      . 0.9 %  sodium chloride infusion  250 mL Intravenous PRN [name], MD      . acetaminophen (TYLENOL) tablet 650 mg  650 mg Oral Q4H PRN [name], MD   650 mg at 10/06/15 1801  . aspirin EC tablet 81 mg  81 mg Oral Daily Erma Heritage, [place]   81 mg at 10/06/15 1032  . atorvastatin (LIPITOR) tablet 80 mg  80 mg Oral q1800 Peter M Martinique, MD   80 mg at 10/06/15 1801  . heparin injection 5,000 Units  5,000 Units Subcutaneous Q8H [name], MD   5,000 Units at 10/04/15 2105  . insulin aspart (novoLOG) injection 0-15 Units  0-15 Units Subcutaneous TID [facility] Erma Heritage, [place]   3 Units at 10/07/15 [id number]  . isosorbide mononitrate (IMDUR) 24 hr tablet 15 mg  15 mg Oral Daily Peter M Martinique, MD   15 mg at 10/06/15 1032  . linagliptin (TRADJENTA) tablet 5 mg  5 mg Oral Daily Erma Heritage, [place]   5 mg at 10/06/15 1032  . metoprolol tartrate (LOPRESSOR) tablet 25 mg  25 mg Oral BID Peter M Martinique, MD   25 mg at 10/06/15 2248  .  nitroGLYCERIN (NITROSTAT) SL tablet 0.4 mg  0.4 mg Sublingual Q5 Min x 3 PRN Erma Heritage, [place]      . ondansetron [facility]) injection 4 mg  4 mg Intravenous Q6H PRN [name], MD   4 mg at 10/04/15 2025  . sodium chloride flush (NS) 0.9 % injection 3 mL  3 mL Intravenous Q12H [name], MD   3 mL at 10/06/15 2200  . sodium chloride flush (NS) 0.9 % injection 3 mL  3 mL Intravenous PRN [name], MD      . ticagrelor [facility]) tablet 90 mg  90 mg Oral BID [name], MD   90 mg at 10/06/15 2248  . traZODone (DESYREL) tablet 50 mg  50 mg Oral QHS PRN Cristina Gong, MD   50 mg at 10/06/15 2248    Physical Exam: General appearance: alert and no distress Neck: no carotid bruit and no JVD Lungs: clear to auscultation bilaterally Heart: regular rate and rhythm, S1, S2 normal, no murmur, click, rub or gallop Abdomen: soft, non-tender; bowel sounds normal; no masses,  no organomegaly Extremities: extremities normal, atraumatic, no cyanosis or edema Pulses: 2+ and symmetric Skin: Skin color, texture, turgor normal. No rashes or lesions Neurologic: Grossly normal Psych: Pleasant  Lab Results:  Results for orders placed or performed during the hospital encounter of 10/04/15 (from the past 48 hour(s))  Glucose, capillary     Status: Abnormal   Collection Time: 10/05/15 12:23 PM  Result Value Ref Range   Glucose-Capillary 151 (H) 65 - 99 mg/dL   Comment 1 Capillary Specimen   Troponin I     Status: Abnormal   Collection Time: 10/05/15  1:49 PM  Result Value Ref Range   Troponin I 14.97 (HH) <0.03 ng/mL    Comment: CRITICAL VALUE NOTED.  VALUE IS CONSISTENT WITH PREVIOUSLY REPORTED AND CALLED VALUE.  Glucose, capillary     Status: Abnormal   Collection Time: 10/05/15  3:53 PM  Result Value Ref Range   Glucose-Capillary 169 (H) 65 - 99 mg/dL   Comment 1 Capillary Specimen   Troponin I     Status: Abnormal   Collection Time: 10/05/15  7:40 PM  Result  Value Ref Range   Troponin I 12.98 (HH) <0.03 ng/mL    Comment: CRITICAL VALUE NOTED.  VALUE IS CONSISTENT WITH PREVIOUSLY REPORTED AND CALLED VALUE.  Glucose, capillary     Status: Abnormal   Collection Time: 10/05/15 10:05 PM  Result Value Ref Range   Glucose-Capillary 170 (H) 65 - 99 mg/dL   Comment 1 Notify RN   Basic metabolic panel     Status: Abnormal   Collection Time: 10/06/15  3:24 AM  Result Value Ref Range   Sodium 137 135 - 145 mmol/L   Potassium 4.0 3.5 - 5.1 mmol/L   Chloride 105 101 - 111 mmol/L   CO2 22 22 - 32 mmol/L   Glucose, Bld 171 (H) 65 - 99 mg/dL   BUN 12 6 - 20 mg/dL   Creatinine, Ser 0.82 0.44 - 1.00 mg/dL   Calcium 9.2 8.9 - 10.3 mg/dL   GFR calc non Af Amer >60 >60 mL/min   GFR calc Af Amer >60 >60 mL/min    Comment: (NOTE) The eGFR has been calculated using the CKD EPI equation. This calculation has not been validated in all clinical situations. eGFR's persistently <60 mL/min signify possible Chronic Kidney Disease.    Anion gap 10 5 - 15  Glucose, capillary     Status: Abnormal   Collection Time: 10/06/15  7:52 AM  Result Value Ref Range   Glucose-Capillary 159 (H) 65 - 99 mg/dL   Comment 1 Capillary Specimen   Glucose, capillary     Status: Abnormal   Collection Time: 10/06/15 12:28 PM  Result Value Ref Range   Glucose-Capillary 147 (H) 65 - 99 mg/dL   Comment 1 Capillary Specimen   Glucose, capillary     Status: Abnormal   Collection Time: 10/06/15  5:59 PM  Result Value Ref Range   Glucose-Capillary 129 (H) 65 - 99 mg/dL   Comment 1 Capillary Specimen   Glucose, capillary     Status: Abnormal   Collection Time: 10/06/15 10:53 PM  Result Value Ref Range   Glucose-Capillary 125 (H) 65 - 99 mg/dL   Comment 1 Capillary Specimen   Glucose, capillary     Status: Abnormal   Collection Time: 10/07/15  7:42 AM  Result Value Ref Range   Glucose-Capillary 158 (H) 65 - 99 mg/dL   Comment 1 Notify RN     Imaging: No results  found.  Assessment:  Principal Problem:   STEMI (ST elevation myocardial infarction) ([place]) Active Problems:   HTN (hypertension)   Dyslipidemia-LDL 197   Diabetes mellitus without  complication (HCC)   S/P CABG x 3   Acute MI, inferior wall, initial episode of care [facility])   Acute ST elevation myocardial infarction (STEMI) ([place])   Plan:  1. Improving from STEMI with early loss of SVG to OM (CABG 3 months ago) - successful PCI to distal RCA lesion with a small Promus Premier stent. Now on Brillinta. HR remains elevated, increase b-blocker today. Echo shows preserved LVEF. Ok to transfer to telemetry today. Anticipate d/c tomorrow.  Time Spent Directly with Patient:  30 minutes  Length of Stay:  LOS: 3 days   Pixie Casino, MD, [facility] Attending Cardiologist [place] 10/07/2015, 10:04 AM

## 2015-10-08 ENCOUNTER — Telehealth: Payer: Self-pay | Admitting: Cardiology

## 2015-10-08 DIAGNOSIS — I25810 Atherosclerosis of coronary artery bypass graft(s) without angina pectoris: Secondary | ICD-10-CM

## 2015-10-08 DIAGNOSIS — Z95820 Peripheral vascular angioplasty status with implants and grafts: Secondary | ICD-10-CM

## 2015-10-08 HISTORY — DX: Peripheral vascular angioplasty status with implants and grafts: Z95.820

## 2015-10-08 HISTORY — DX: Atherosclerosis of coronary artery bypass graft(s) without angina pectoris: I25.810

## 2015-10-08 LAB — GLUCOSE, CAPILLARY
Glucose-Capillary: 158 mg/dL — ABNORMAL HIGH (ref 65–99)
Glucose-Capillary: 160 mg/dL — ABNORMAL HIGH (ref 65–99)

## 2015-10-08 MED ORDER — ISOSORBIDE MONONITRATE ER 30 MG PO TB24: 15.0000 mg | ORAL_TABLET | Freq: Every day | ORAL | 0 refills | Status: DC

## 2015-10-08 MED ORDER — ACETAMINOPHEN 325 MG PO TABS: 650.0000 mg | ORAL_TABLET | ORAL | Status: DC | PRN

## 2015-10-08 MED ORDER — TICAGRELOR 90 MG PO TABS: 90.0000 mg | ORAL_TABLET | Freq: Two times a day (BID) | ORAL | 0 refills | Status: DC

## 2015-10-08 MED ORDER — ATORVASTATIN CALCIUM 80 MG PO TABS: 80.0000 mg | ORAL_TABLET | Freq: Every day | ORAL | 0 refills | Status: DC

## 2015-10-08 MED ORDER — METOPROLOL TARTRATE 37.5 MG PO TABS: 37.5000 mg | ORAL_TABLET | Freq: Two times a day (BID) | ORAL | 3 refills | Status: DC

## 2015-10-08 MED ORDER — METFORMIN HCL 500 MG PO TABS: 1000.0000 mg | ORAL_TABLET | Freq: Two times a day (BID) | ORAL | Status: DC

## 2015-10-08 NOTE — Plan of Care (Signed)
Problem: Safety: Goal: Ability to remain free from injury will improve Outcome: Completed/Met Date Met: 10/08/15 Patient uses call light and phone appropriately to verbalize her needs and is able to get OOB independently without any problems. Patient is able to express her needs and know to call if she needs assistance, all personal belongings are within her reach.

## 2015-10-08 NOTE — Care Management Important Message (Signed)
Important Message  Patient Details  Name: [name] [name] MRN: [id number] Date of Birth: 11-09-1960   Medicare Important Message Given:  Yes    Virginio Isidore Abena 10/08/2015, 11:06 AM

## 2015-10-08 NOTE — Progress Notes (Signed)
CARDIAC REHAB PHASE I   PRE:  Rate/Rhythm: 83 SR    BP: sitting 121/71    SaO2: 96 RA  MODE:  Ambulation: 420 ft   POST:  Rate/Rhythm: 107 ST    BP: sitting 139/83     SaO2: 98 RA  Tolerated well, no c/o. Able to talk and walk. Reviewed NTG use, importance of Brilinta, and CRPII. Voiced understanding. [id number]   [place], ACSM 10/08/2015 10:51 AM

## 2015-10-08 NOTE — Discharge Summary (Signed)
Physician Discharge Summary       Patient ID: CHE BLATCHFORD MRN: [id number] DOB/AGE: March 26, 1960 55 y.o.  Admit date: 10/04/2015 Discharge date: 10/08/2015 Primary Cardiologist:Dr. Martinique   Discharge Diagnoses:  Principal Problem:   Acute ST elevation myocardial infarction (STEMI) involving right coronary artery [facility]) Active Problems:   S/P angioplasty with stent; 10/04/15 to distal RCA lesion. with Promus premier.   HTN (hypertension)   Dyslipidemia-LDL 197, goal < 70   Diabetes mellitus without complication (HCC)   S/P CABG x 3   Acute MI, inferior wall, initial episode of care [facility])   Acute ST elevation myocardial infarction (STEMI) ([place])   CAD (coronary artery disease) of artery bypass graft; occluded SVG to RCA and occluded SVG to OM   Discharged Condition: good  Procedures: 10/04/15 emergent cardiac cath and PCI by Dr. Irish Lack  Procedures   Coronary Stent Intervention  Left Heart Cath and Coronary Angiography  Conclusion    Prox LAD lesion, 85% stenosed. Patent LIMA to LAD.  Severe native 3 vessel CAD.  Lat 1st Mrg-2 lesion, 100% stenosed. Occluded SVG to OM.  Ost RCA to Mid RCA lesion, 70% stenosed. Ocluded SVG to RCA,  Dist RCA lesion, 99% stenosed. Post intervention with a 2.25 x 12 Promus Premier, there is a 0% residual stenosis.  Normal LVEDP.   Switch to Brilinta from Plavix.  She needs aggressive secondary prevention.  Patient with residual ST elevation in the inferior leads but her chest pain has improved significantly.  Proximal RCA disease did not seem critical, compared to distal lesion.    Indications   Acute MI, inferior wall, initial episode of care ([place]) [I21.19 (ICD-10-CM)]   ECHO 10/05/15:  LV EF: 55% -   60%  ------------------------------------------------------------------- Indications:      CAD of native vessels 414.01.  ------------------------------------------------------------------- History:   Risk factors:  Hypertension.  Diabetes mellitus. Dyslipidemia.  ------------------------------------------------------------------- Study Conclusions  - Left ventricle: The cavity size was normal. Wall thickness was   increased in a pattern of mild LVH. Systolic function was normal.   The estimated ejection fraction was in the range of 55% to 60%.   Inferior wall hypokinesis. Doppler parameters are consistent with   abnormal left ventricular relaxation (grade 1 diastolic   dysfunction). The E/e&' ratio is between 8-15, suggesting   indeterminate LV filling pressure. - Ventricular septum: Septal motion showed abnormal function and   dyssynergy. - Aortic valve: Trileaflet. Sclerosis without stenosis. There was   no regurgitation. - Mitral valve: Mildly thickened leaflets . There was trivial   regurgitation. - Right ventricle: The cavity size was normal. Systolic function is   low normal. - Right atrium: The atrium was normal in size. - Tricuspid valve: There was trivial regurgitation. - Pulmonary arteries: PA peak pressure: 24 mm Hg (S). - Inferior vena cava: The vessel was normal in size. The   respirophasic diameter changes were in the normal range (>= 50%),   consistent with normal central venous pressure.  Impressions:  - Compared to a recent echo in 06/2015, the LVEF is stable at   55-60%, however, there is inferior hypokinesis and relatively   hyperdynamic anterior wall motion.   Hospital Course:  55 y.o. female with past medical history of CAD (s/p CABG on 06/19/2015 w/ LIMA-LAD, SVG-RCA, SVG-OM2), Lupus, Type 2 DM, HTN, and HLD who presented to [facility] ED on 10/04/2015 for evaluation of chest pain.  In April hospitalized with NSTEMI and cath with severe 3-vessel disease with 80%  RCA stenosis, 85% Prox LAD stenosis, and 90% 1st Mrg with 85% 2nd Mrg. CABG was recommended and this was performed on 4/4 with the LIMA-LAD, SVG-RCA, and SVG-OM2. She was started on ASA and Plavix.  On presentation on  10/04/15 she complained of chest discomfort for the past 3 weeks. The pain came at rest or with exertion. She reported being under intense stress due to the passing of her husband last year, and multiple aunts and uncles in the past few months and thinks her pain is worse when she is stressed. The pain has continued to intensity over the past few weeks, more notable worse day prior to admit. She had associated nausea and vomiting, and took several SL NTG throughout the day with minimal relief. She reported good compliance with her ASA and Plavix, having not missed any doses.  She reports giving up cigarettes in 05/2015 and has "not touched one since".   Upon arrival to the ED, she was noted to have ST elevation in the inferior and lateral leads of her EKG. She was therefore taken emergently to the cardiac catheterization lab.  Cath revealed Patent LIMA to the LAD, occluded SVG to OM, occluded SVG to RCA.  Dis. RCA 99% stenosed and she underwent emergent PCI with Promus DES to distal RCA.     Post procedure she did well, her metformin was held with recent cath and she was placed on SSI.  She had hypokalemia and this was replaced.     Her troponin peaked at 14 and then began trending downward.  Now on Brillinta changed from plavix.  Still with persistent ST elevation inferiorly and tachycardia. BP low normal. On metoprolol 25 mg BID, imdur 15 mg and now on Lipitor 80 mg daily. Also ASA.   Echo with EF 55-60%.  BB increased 10/07/15.    10/08/15 pt is stable- she just walked with cardiac rehab without any complaints.  She continues with mild upper chest "twinges" but this does not increase with exertion.   She is seen by Dr. Ron Parker and found stable for discharge.    EKG yesterday with SR and continued ST elevation in inf leads with reciprocal changes in lat leads.     She has ambulated with cardiac rehab and has been recommended for her to resume at different level phase II.     She is on high dose  statin, she may resume her diabetic meds. Including metformin.  Glucose is controlled. No need for ACE with normal EF.  Pt was on Olmesartan-amlodipine-Hctz prior to admit.  Her BP here has been borderline so we did not resume at discharge.  This may need to be resumed on post hospital visit.  We changed her zocor to lipitor.  She will need refills on post hospital visit through her mail away pharmacy.    She will follow up TOC appt and then keep appt with Dr. Martinique in August.    Consults: None  Significant Diagnostic Studies:  BMP Latest Ref Rng & Units 10/07/2015 10/06/2015 10/05/2015  Glucose 65 - 99 mg/dL 164(H) 171(H) 167(H)  BUN 6 - 20 mg/dL 9 12 14   Creatinine 0.44 - 1.00 mg/dL 0.84 0.82 0.89  Sodium 135 - 145 mmol/L 137 137 138  Potassium 3.5 - 5.1 mmol/L 4.5 4.0 3.4(L)  Chloride 101 - 111 mmol/L 102 105 102  CO2 22 - 32 mmol/L 23 22 26   Calcium 8.9 - 10.3 mg/dL 9.6 9.2 9.4   CBC Latest Ref Rng &  Units 10/04/2015 06/24/2015 06/22/2015  WBC 4.0 - 10.5 K/uL - 15.6(H) 19.9(H)  Hemoglobin 12.0 - 15.0 g/dL 16.0(H) 10.5(L) 10.7(L)  Hematocrit 36.0 - 46.0 % 47.0(H) 33.6(L) 34.3(L)  Platelets 150 - 400 K/uL - 293 194   Troponin  29.38;  13.47; 14.97; 12.98  Lipid Panel     Component Value Date/Time   CHOL 160 10/05/2015 0305   TRIG 142 10/05/2015 0305   HDL 56 10/05/2015 0305   CHOLHDL 2.9 10/05/2015 0305   VLDL 28 10/05/2015 0305   LDLCALC 76 10/05/2015 0305    Discharge Exam: Blood pressure 115/73, pulse 87, temperature 98.2 F (36.8 C), resp. rate (!) 22, height 5\' 7"  (1.702 m), weight 206 lb (93.4 kg), SpO2 97 %. General:Pleasant affect, NAD Skin:Warm and dry, brisk capillary refill HEENT:normocephalic, sclera clear, mucus membranes moist Neck:supple, no JVD  Heart:S1S2 RRR without murmur, gallup, rub or click Lungs:clear without rales, rhonchi, or wheezes [id number], non tender, + BS, do not palpate liver spleen or masses Ext:no lower ext edema, 2+ pedal pulses, 2+  radial pulses, rt axilla area with cyst, not inflamed.  Neuro:alert and oriented X 3, MAE, follows commands, + facial symmetry Tele: SR with occ PVCs    Disposition: 01-Home or Self Care  Discharge Instructions    Amb Referral to Cardiac Rehabilitation    Complete by:  As directed   Diagnosis:   STEMI Coronary Stents         Medication List    STOP taking these medications   clopidogrel 75 MG tablet Commonly known as:  PLAVIX   Olmesartan-Amlodipine-HCTZ 20-5-12.5 MG Tabs   promethazine 12.5 MG tablet Commonly known as:  PHENERGAN   simvastatin 40 MG tablet Commonly known as:  ZOCOR     TAKE these medications   acetaminophen 325 MG tablet Commonly known as:  TYLENOL Take 2 tablets (650 mg total) by mouth every 4 (four) hours as needed for headache or mild pain.   aspirin 81 MG EC tablet Take 1 tablet (81 mg total) by mouth daily.   atorvastatin 80 MG tablet Commonly known as:  LIPITOR Take 1 tablet (80 mg total) by mouth daily at 6 PM.   GERITOL COMPLETE PO Take 1 tablet by mouth daily.   INVOKANA 300 MG Tabs tablet Generic drug:  canagliflozin Take 300 mg by mouth daily.   isosorbide mononitrate 30 MG 24 hr tablet Commonly known as:  IMDUR Take 0.5 tablets (15 mg total) by mouth daily.   JANUVIA 100 MG tablet Generic drug:  sitaGLIPtin Take 100 mg by mouth daily.   metFORMIN 1000 MG tablet Commonly known as:  GLUCOPHAGE Take 1,000 mg by mouth 2 (two) times daily.   Metoprolol Tartrate 37.5 MG Tabs Take 37.5 mg by mouth 2 (two) times daily. What changed:  medication strength  how much to take   nitroGLYCERIN 0.4 MG SL tablet Commonly known as:  NITROSTAT Place 1 tablet (0.4 mg total) under the tongue every 5 (five) minutes as needed for chest pain. MAX 3 doses   ticagrelor 90 MG Tabs tablet Commonly known as:  BRILINTA Take 1 tablet (90 mg total) by mouth 2 (two) times daily.   traMADol 50 MG tablet Commonly known as:  ULTRAM Take 1  tablet (50 mg total) by mouth every 6 (six) hours as needed.   Vitamin D3 50000 units Caps Take 50,000 Units by mouth once a week.      Follow-up Information    Angelena Form, [name] Follow up  on 10/12/2015.   Specialties:  Cardiology, Radiology Why:  at 11:30 AM at the [facility]-  this is Dr. Doug Sou PA.   Contact information: [address] [place] [address] [phone]        [name], MD Follow up on 10/25/2015.   Specialty:  Cardiology Why:  keep appointment with Dr. Martinique at [facility] office at 9: 15 AM Contact information: [address] [place] [address] [phone]            Discharge Instructions: Heart Healthy Diabetic Diet.   Call [facility] at [phone] if any bleeding, swelling or drainage at cath site.  May shower, no tub baths for 48 hours for groin sticks. No lifting over 5 pounds for 9 days.  No Driving for 7 days  No work until cleared in office  Do Not stop asprin and Brilinta- stopping could cause a heart attack.  Take 1 NTG, under your tongue, while sitting.  If no relief of pain may repeat NTG, one tab every 5 minutes up to 3 tablets total over 15 minutes.  If no relief CALL 911.  If you have dizziness/lightheadness  while taking NTG, stop taking and call 911.        Bring all meds with you to appt.   Signed: Cecilie Kicks Nurse Practitioner-Certified [facility]: [facility] 10/08/2015, 11:26 AM  Time spent on discharge : > 30 minutes.   Patient seen and examined. I agree with the assessment and plan as detailed above. See also my additional thoughts below.   The patient is bradycardia for discharge. The mild symptoms that she continues to have are not cardiac in origin. I have reviewed all of the data and I have spoken with her. I agree with the discharge note outlined above. I made the decision for discharge.  Dola Argyle, MD, [facility] 10/08/2015 11:42  AM

## 2015-10-08 NOTE — Care Management (Signed)
Late Entry: CM did provide pt with 30 day free card for Brilinta. CM did call [facility] on [address] and medication is available. No further needs from CM at this time. Bethena Roys, RN,BSN [phone]

## 2015-10-08 NOTE — Discharge Instructions (Signed)
Heart Healthy Diabetic Diet.   Call [facility] at [phone] if any bleeding, swelling or drainage at cath site.  May shower, no tub baths for 48 hours for groin sticks. No lifting over 5 pounds for 9 days.  No Driving for 7 days  No work until cleared in office  Do Not stop asprin and Brilinta- stopping could cause a heart attack.  Take 1 NTG, under your tongue, while sitting.  If no relief of pain may repeat NTG, one tab every 5 minutes up to 3 tablets total over 15 minutes.  If no relief CALL 911.  If you have dizziness/lightheadness  while taking NTG, stop taking and call 911.        Bring all meds with you to appointment.     Acute Coronary Syndrome Acute coronary syndrome (ACS) is a serious problem in which there is suddenly not enough blood and oxygen supplied to the heart. ACS may mean that one or more of the blood vessels in your heart (coronary arteries) may be blocked. ACS can result in chest pain or a heart attack (myocardial infarction or MI). CAUSES This condition is caused by atherosclerosis, which is the buildup of fat and cholesterol (plaque) on the inside of the arteries. Over time, the plaque may narrow or block the artery, and this will lessen blood flow to the heart. Plaque can also become weak and break off within a coronary artery to form a clot and cause a sudden blockage. RISK FACTORS The risks factors of this condition include:  High cholesterol levels.  High blood pressure (hypertension).  Smoking.  Diabetes.  Age.  Family history of chest pain, heart disease, or stroke.  Lack of exercise. SYMPTOMS The most common signs of this condition include:  Chest pain, which can be:  A crushing or squeezing in the chest.  A tightness, pressure, fullness, or heaviness in the chest.  Present for more than a few minutes, or it can stop and recur.  Pain in the arms, neck, jaw, or back.  Unexplained heartburn or  indigestion.  Shortness of breath.  Nausea.  Sudden cold sweats.  Feeling light-headed or dizzy. Sometimes, this condition has no symptoms. DIAGNOSIS ACS may be diagnosed through the following tests:  Electrocardiogram (ECG).  Blood tests.  Coronary angiogram. This is a procedure to look at the coronary arteries to see if there is any blockage. TREATMENT Treatment for ACS may include:  Healthy behavioral changes to reduce or control risk factors.  Medicine.  Coronary stenting.A stent helps to keep an artery open.  Coronary angioplasty. This procedure widens a narrowed or blocked artery.  Coronary artery bypass surgery. This will allow your blood to pass the blockage (bypass) to reach your heart. HOME CARE INSTRUCTIONS Eating and Drinking  Follow a heart-healthy diet. A dietitian can you help to educate you about healthy food options and changes.  Use healthy cooking methods such as roasting, grilling, broiling, baking, poaching, steaming, or stir-frying. Talk to a dietitian to learn more about healthy cooking methods. Medicines  Take medicines only as directed by your health care provider.  Do not take the following medicines unless your health care provider approves:  Nonsteroidal anti-inflammatory drugs (NSAIDs), such as ibuprofen, naproxen, or celecoxib.  Vitamin supplements that contain vitamin A, vitamin E, or both.  Hormone replacement therapy that contains estrogen with or without progestin.  Stop illegal drug use. Activities  Follow an exercise program that is approved by your health care provider.  Plan rest periods when you are fatigued. Lifestyle  Do not use any tobacco products, including cigarettes, chewing tobacco, or electronic cigarettes. If you need help quitting, ask your health care provider.  If you drink alcohol, and your health care provider approves, limit your alcohol intake to no more than 1 drink per day. One drink equals 12 ounces  of beer, 5 ounces of wine, or 1 ounces of hard liquor.  Learn to manage stress.  Maintain a healthy weight. Lose weight as approved by your health care provider. General Instructions  Manage other health conditions, such as hypertension and diabetes, as directed by your health care provider.  Keep all follow-up visits as directed by your health care provider. This is important.  Your health care provider may ask you to monitor your blood pressure. A blood pressure reading consists of a higher number over a lower number, such as 110 over 72, written as 110/72. Ideally, your blood pressure should be:  Below 140/90 if you have no other medical conditions.  Below 130/80 if you have diabetes or kidney disease. SEEK IMMEDIATE MEDICAL CARE IF:  You have pain in your chest, neck, arm, jaw, stomach, or back that lasts more than a few minutes, is recurring, or is not relieved by taking medicine under your tongue (sublingual nitroglycerin).  You have profuse sweating without cause.  You have unexplained:  Heartburn or indigestion.  Shortness of breath or difficulty breathing.  Nausea or vomiting.  Fatigue.  Feelings of nervousness or anxiety.  Weakness.  Diarrhea.  You have sudden light-headedness or dizziness.  You faint. These symptoms may represent a serious problem that is an emergency. Do not wait to see if the symptoms will go away. Get medical help right away. Call your local emergency services (911 in the [place] Do not drive yourself to the clinic or hospital.   This information is not intended to replace advice given to you by your health care provider. Make sure you discuss any questions you have with your health care provider.   Document Released: 03/03/2005 Document Revised: 03/24/2014 Document Reviewed: 07/05/2013 Elsevier Interactive Patient Education [employer].

## 2015-10-08 NOTE — Telephone Encounter (Signed)
TCM per Mickel Baas  7/28 @ 1130 w/ Joellen Jersey

## 2015-10-08 NOTE — Telephone Encounter (Signed)
Patient contacted regarding discharge from [facility] on 10/08/2015.  Patient understands to follow up with provider Marita Snellen on 07/28 at 11:30 at [facility]. Patient understands discharge instructions? Yes Patient understands medications and regiment? Yes Patient understands to bring all medications to this visit? Yes

## 2015-10-08 NOTE — Plan of Care (Signed)
Problem: Education: Goal: Knowledge of [facility] Education information/materials will improve Outcome: Completed/Met Date Met: 10/08/15 Patient has information and Welcome booklet and is very well educated on her procedures, meds, POC and discharge plans, all questions reviewed and answered and patient verbalized understanding.

## 2015-10-12 ENCOUNTER — Encounter: Payer: Self-pay | Admitting: Physician Assistant

## 2015-10-12 ENCOUNTER — Ambulatory Visit (INDEPENDENT_AMBULATORY_CARE_PROVIDER_SITE_OTHER): Payer: Commercial Managed Care - HMO | Admitting: Physician Assistant

## 2015-10-12 DIAGNOSIS — E119 Type 2 diabetes mellitus without complications: Secondary | ICD-10-CM | POA: Diagnosis not present

## 2015-10-12 DIAGNOSIS — I25708 Atherosclerosis of coronary artery bypass graft(s), unspecified, with other forms of angina pectoris: Secondary | ICD-10-CM

## 2015-10-12 DIAGNOSIS — I10 Essential (primary) hypertension: Secondary | ICD-10-CM

## 2015-10-12 DIAGNOSIS — Z951 Presence of aortocoronary bypass graft: Secondary | ICD-10-CM | POA: Insufficient documentation

## 2015-10-12 DIAGNOSIS — E785 Hyperlipidemia, unspecified: Secondary | ICD-10-CM

## 2015-10-12 NOTE — Patient Instructions (Signed)
Medication Instructions:  .Your physician recommends that you continue on your current medications as directed. Please refer to the Current Medication list given to you today.    Labwork: None ordered  Testing/Procedures: None ordered  Follow-Up: Your physician recommends that you schedule a follow-up appointment in: KEEP YOUR SCHEDULED APPT WITH DR. Martinique    Any Other Special Instructions Will Be Listed Below (If Applicable).     If you need a refill on your cardiac medications before your next appointment, please call your pharmacy.

## 2015-10-12 NOTE — Progress Notes (Signed)
Cardiology Office Note    Date:  10/12/2015   ID:  EIMAN EHRGOTT, DOB [birth year], MRN [id number]  PCP:  Kandice Hams, MD  Cardiologist:  Dr. Martinique  Chief Complaint: Hospital follow up s/p  STEMI and stent placement   History of Present Illness:   [name] [name] is a 55 y.o. female with past medical history of CAD (s/p CABG on 06/19/2015 w/ LIMA-LAD, SVG-RCA, SVG-OM2), Lupus, Type 2 DM, HTN, and HLD who presented for hospital follow up.   In April hospitalized with NSTEMI and cath with severe 3-vessel disease with 80% RCA stenosis, 85% Prox LAD stenosis, and 90% 1st Mrg with 85% 2nd Mrg. CABG was recommended and this was performed on 4/4 with the LIMA-LAD, SVG-RCA, and SVG-OM2. She was started on ASA and Plavix.  She presented 10/04/15 to [facility] with complained of chest discomfort for the past 3 weeks. Upon arrival to the ED, she was noted to have ST elevation in the inferior and lateral leads of her EKG. Emergently to cardiac catheterization revealed Patent LIMA to the LAD, occluded SVG to OM, occluded SVG to RCA.  Dis. RCA 99% stenosed and she underwent emergent PCI with Promus DES to distal RCA. Her troponin peaked at 14 and then began trending downward. Plavix changed to [place]. Pt had persistent ST elevation inferiorly and tachycardia. Increased BB.Echo with EF 55-60% inferior hypokinesis and relatively hyperdynamic anterior wall motion.  She continues with mild upper chest "twinges" but this does not increase with exertion. Zocor changed to lipitor. Pt was on Olmesartan-amlodipine-Hctz prior to admit.  Her BP here has been borderline so did not resume at discharge. Plan to restart during outpatient visit.   Presents today for follow up. Felling well. No exertional or rest chest pain or sob. Denies orthopnea, PND or syncope. She is planning to restart [employer] this fall (mostly online). She does not work.    Past Medical History:  Diagnosis Date  . Acute ST elevation myocardial  infarction (STEMI) involving right coronary artery ([place]) 10/04/2015  . Anxiety   . Anxiety and depression   . CAD (coronary artery disease)    a. CABG 06/2015: LIMA-LAD, SVG-RCA, SVG-OM2 b. STEMI s/p PCI to distal RCA lesion with a small Promus Premier stent. ( patent LIMA to the LAD, occluded SVG to OM, occluded SVG to RCA.)  . CAD (coronary artery disease) of artery bypass graft; occluded SVG to RCA and occluded SVG to OM 10/08/2015  . Empty sella ([place])   . Hidradenitis axillaris   . Hyperlipemia   . Hypertension   . Kidney stones   . Lupus ([place]) dx'd 2008   "they think I have this; have to get retested" (06/12/2015)  . Obesity (BMI 30.0-34.9)   . Ovarian cyst   . Pneumonia X 2  . PONV (postoperative nausea and vomiting)   . 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 Rheumatoid arthritis ([place])   . S/P angioplasty with stent; 10/04/15 to distal RCA lesion. with Promus premier. 10/08/2015  . Scoliosis   . Stroke [facility]) March 2014   left MCA branches; "they say I have them all the time", denies residual on 06/12/2015  . Tobacco abuse   . Type II diabetes mellitus ([place])     Past Surgical History:  Procedure Laterality Date  . ABDOMINAL HYSTERECTOMY    . BREAST BIOPSY Bilateral   . CARDIAC CATHETERIZATION N/A 06/13/2015   Procedure: Left Heart Cath and Coronary Angiography;  Surgeon: Peter M Martinique, MD;  Location:  [place] INVASIVE CV LAB;  Service: Cardiovascular;  Laterality: N/A;  . CARDIAC CATHETERIZATION N/A 10/04/2015   Procedure: Left Heart Cath and Coronary Angiography;  Surgeon: [name], MD;  Location: [place] CV LAB;  Service: Cardiovascular;  Laterality: N/A;  . CARDIAC CATHETERIZATION N/A 10/04/2015   Procedure: Coronary Stent Intervention;  Surgeon: [name], MD;  Location: [place] CV LAB;  Service: Cardiovascular;  Laterality: N/A;  . CORONARY ARTERY BYPASS GRAFT N/A 06/19/2015   Procedure: CORONARY ARTERY BYPASS GRAFTING (CABG) TIMES FOUR USING LEFT INTERNAL MAMMARY,  RIGHT SAPHENOUS LEG VEIN AND CRYO SAPHENOUS VEIN;  Surgeon: [name], MD;  Location: [place];  Service: Open Heart Surgery;  Laterality: N/A;  . DILATION AND CURETTAGE OF UTERUS    . TEE WITHOUT CARDIOVERSION N/A 06/19/2015   Procedure: TRANSESOPHAGEAL ECHOCARDIOGRAM (TEE);  Surgeon: [name], MD;  Location: [place];  Service: Open Heart Surgery;  Laterality: N/A;  . TONSILLECTOMY  1960s  . TUBAL LIGATION      Current Medications: Prior to Admission medications   Medication Sig Start Date End Date Taking? Authorizing Provider  acetaminophen (TYLENOL) 325 MG tablet Take 2 tablets (650 mg total) by mouth every 4 (four) hours as needed for headache or mild pain. 10/08/15   Isaiah Serge, NP  aspirin EC 81 MG EC tablet Take 1 tablet (81 mg total) by mouth daily. 06/25/15   Donielle Liston Alba, [name]  atorvastatin (LIPITOR) 80 MG tablet Take 1 tablet (80 mg total) by mouth daily at 6 PM. 10/08/15   Isaiah Serge, NP  Cholecalciferol (VITAMIN D3) 50000 units CAPS Take 50,000 Units by mouth once a week. 04/09/15   Historical Provider, MD  INVOKANA 300 MG TABS tablet Take 300 mg by mouth daily. 06/05/15   Historical Provider, MD  Iron-Vitamins (GERITOL COMPLETE PO) Take 1 tablet by mouth daily.    Historical Provider, MD  isosorbide mononitrate (IMDUR) 30 MG 24 hr tablet Take 0.5 tablets (15 mg total) by mouth daily. 10/08/15   Isaiah Serge, NP  JANUVIA 100 MG tablet Take 100 mg by mouth daily. 04/06/15   Historical Provider, MD  metFORMIN (GLUCOPHAGE) 1000 MG tablet Take 1,000 mg by mouth 2 (two) times daily. 04/06/15   Historical Provider, MD  Metoprolol Tartrate 37.5 MG TABS Take 37.5 mg by mouth 2 (two) times daily. 10/08/15   Isaiah Serge, NP  nitroGLYCERIN (NITROSTAT) 0.4 MG SL tablet Place 1 tablet (0.4 mg total) under the tongue every 5 (five) minutes as needed for chest pain. MAX 3 doses 10/03/15   Peter M Martinique, MD  ticagrelor (BRILINTA) 90 MG TABS tablet Take 1 tablet (90 mg total) by  mouth 2 (two) times daily. 10/08/15   Isaiah Serge, NP  traMADol (ULTRAM) 50 MG tablet Take 1 tablet (50 mg total) by mouth every 6 (six) hours as needed. 09/10/15   Grace Isaac, MD    Allergies:   Review of patient's allergies indicates no known allergies.   Social History   Social History  . Marital status: Widowed    Spouse name: N/A  . Number of children: 2  . Years of education: college   Occupational History  . Disability    Social History Main Topics  . Smoking status: Former Smoker    Packs/day: 0.50    Years: 36.00    Types: Cigarettes    Quit date: 06/12/2014  . Smokeless tobacco: Never Used  . Alcohol use No  .  Drug use: No  . Sexual activity: No   Other Topics Concern  . None   Social History Narrative   Patient currently lives at home with her disabled husband who was shot in 2009 and he is total care. She lives with her son and daughter and there is an 8 that comes in. Patient currently is on disability   She occasionally drinks caffeine.      Family History:  The patient's family history includes Diabetes in her mother; Heart failure in her mother; Stroke in her maternal aunt.   ROS:   Please see the history of present illness.    ROS All other systems reviewed and are negative.   PHYSICAL EXAM:   VS:  BP 120/70   Pulse 80   Ht 5\' 6"  (1.676 m)   Wt 209 lb (94.8 kg)   BMI 33.73 kg/m    GEN: Well nourished, well developed, in no acute distress  HEENT: normal  Neck: no JVD, carotid bruits, or masses Cardiac: RRR; no murmurs, rubs, or gallops,no edema. R groin cath site stable Respiratory:  clear to auscultation bilaterally, normal work of breathing GI: soft, nontender, nondistended, + BS MS: no deformity or atrophy  Skin: warm and dry, no rash Neuro:  Alert and Oriented x 3, Strength and sensation are intact Psych: euthymic mood, full affect  Wt Readings from Last 3 Encounters:  10/12/15 209 lb (94.8 kg)  10/08/15 206 lb (93.4 kg)    08/28/15 211 lb 3.2 oz (95.8 kg)      Studies/Labs Reviewed:   EKG:  EKG is ordered today.  The ekg ordered today demonstrates sinus rhythm at rate of 81 bpm, persistent ST elevated in inferior leads which seems improved. New TWI in lateral leads.   Recent Labs: 06/13/2015: TSH 0.943 06/14/2015: ALT 21 06/20/2015: Magnesium 2.3 06/24/2015: Platelets 293 10/04/2015: Hemoglobin 16.0 10/07/2015: BUN 9; Creatinine, Ser 0.84; Potassium 4.5; Sodium 137   Lipid Panel    Component Value Date/Time   CHOL 160 10/05/2015 0305   TRIG 142 10/05/2015 0305   HDL 56 10/05/2015 0305   CHOLHDL 2.9 10/05/2015 0305   VLDL 28 10/05/2015 0305   LDLCALC 76 10/05/2015 0305    Additional studies/ records that were reviewed today include:   Echocardiogram: 10/05/15 LV EF: 55% -   60%  ------------------------------------------------------------------- Indications:      CAD of native vessels 414.01.  ------------------------------------------------------------------- History:   Risk factors:  Hypertension. Diabetes mellitus. Dyslipidemia.  ------------------------------------------------------------------- Study Conclusions  - Left ventricle: The cavity size was normal. Wall thickness was   increased in a pattern of mild LVH. Systolic function was normal.   The estimated ejection fraction was in the range of 55% to 60%.   Inferior wall hypokinesis. Doppler parameters are consistent with   abnormal left ventricular relaxation (grade 1 diastolic   dysfunction). The E/e&' ratio is between 8-15, suggesting   indeterminate LV filling pressure. - Ventricular septum: Septal motion showed abnormal function and   dyssynergy. - Aortic valve: Trileaflet. Sclerosis without stenosis. There was   no regurgitation. - Mitral valve: Mildly thickened leaflets . There was trivial   regurgitation. - Right ventricle: The cavity size was normal. Systolic function is   low normal. - Right atrium: The atrium was  normal in size. - Tricuspid valve: There was trivial regurgitation. - Pulmonary arteries: PA peak pressure: 24 mm Hg (S). - Inferior vena cava: The vessel was normal in size. The   respirophasic diameter changes were  in the normal range (>= 50%),   consistent with normal central venous pressure.  Impressions:  - Compared to a recent echo in 06/2015, the LVEF is stable at   55-60%, however, there is inferior hypokinesis and relatively   hyperdynamic anterior wall motion.  Cardiac Catheterization: 10/04/15    Prox LAD lesion, 85% stenosed. Patent LIMA to LAD.  Severe native 3 vessel CAD.  Lat 1st Mrg-2 lesion, 100% stenosed. Occluded SVG to OM.  Ost RCA to Mid RCA lesion, 70% stenosed. Ocluded SVG to RCA,  Dist RCA lesion, 99% stenosed. Post intervention with a 2.25 x 12 Promus Premier, there is a 0% residual stenosis.  Normal LVEDP.   Switch to Brilinta from Plavix.  She needs aggressive secondary prevention.  Patient with residual ST elevation in the inferior leads but her chest pain has improved significantly.  Proximal RCA disease did not seem critical, compared to distal lesion.       ASSESSMENT & PLAN:    1. CAD s/p CABG 06/2015 - STEMI 10/04/15 with early loss of SVG to OM & SVG to RCA  s/p PCI to distal RCA lesion with a small Promus Premier stent. Patent LIMA to LAD.  Echo with EF 55-60% inferior hypokinesis and relatively hyperdynamic anterior wall motion.   - EKG today showed persistent ST elevated in inferior leads which seems improved. New TWI in lateral leads. She is asymptomatic currently. Will continue to follow closely. Advised to call or go to ER if chest pain returns.  - Continue ASA,  Brillinta (chnaged from plavix), statin, metoprolol and imdur.   2. HLD - 10/05/2015: Cholesterol 160; HDL 56; LDL Cholesterol 76; Triglycerides 142; VLDL 28  - Continue statin  3. HTN - BP stable and well controlled. Will continue BB and imdur. Would not resume   Olmesartan-amlodipine-Hctz today as bp stable. Will continue to follow.   4. DM - Managed by PCP   Medication Adjustments/Labs and Tests Ordered: Current medicines are reviewed at length with the patient today.  Concerns regarding medicines are outlined above.  Medication changes, Labs and Tests ordered today are listed in the Patient Instructions below. Patient Instructions  Medication Instructions:  .Your physician recommends that you continue on your current medications as directed. Please refer to the Current Medication list given to you today.    Labwork: None ordered  Testing/Procedures: None ordered  Follow-Up: Your physician recommends that you schedule a follow-up appointment in: KEEP YOUR SCHEDULED APPT WITH DR. Martinique    Any Other Special Instructions Will Be Listed Below (If Applicable).     If you need a refill on your cardiac medications before your next appointment, please call your pharmacy.      Jarrett Soho, [place]  10/12/2015 12:16 PM    [facility] [place], [place], [place]  [address] Phone: [phone]; Fax: [phone]

## 2015-10-15 ENCOUNTER — Ambulatory Visit: Payer: Medicare Other

## 2015-10-22 DIAGNOSIS — I2510 Atherosclerotic heart disease of native coronary artery without angina pectoris: Secondary | ICD-10-CM | POA: Diagnosis not present

## 2015-10-22 DIAGNOSIS — Z1231 Encounter for screening mammogram for malignant neoplasm of breast: Secondary | ICD-10-CM | POA: Diagnosis not present

## 2015-10-22 DIAGNOSIS — I10 Essential (primary) hypertension: Secondary | ICD-10-CM | POA: Diagnosis not present

## 2015-10-22 DIAGNOSIS — Z7984 Long term (current) use of oral hypoglycemic drugs: Secondary | ICD-10-CM | POA: Diagnosis not present

## 2015-10-22 DIAGNOSIS — E1121 Type 2 diabetes mellitus with diabetic nephropathy: Secondary | ICD-10-CM | POA: Diagnosis not present

## 2015-10-22 DIAGNOSIS — Z8673 Personal history of transient ischemic attack (TIA), and cerebral infarction without residual deficits: Secondary | ICD-10-CM | POA: Diagnosis not present

## 2015-10-22 DIAGNOSIS — E785 Hyperlipidemia, unspecified: Secondary | ICD-10-CM | POA: Diagnosis not present

## 2015-10-24 NOTE — Progress Notes (Signed)
Cardiology Office Note    Date:  10/25/2015   ID:  [name] [name], DOB 25-Apr-1960, MRN [id number]  PCP:  Leeroy Cha  Cardiologist:  Dr. Martinique  Chief Complaint: follow up CAD  History of Present Illness:   [name] [name] is a 55 y.o. female with past medical history of CAD (s/p CABG on 06/19/2015 w/ LIMA-LAD, SVG-RCA, SVG-OM2), Lupus, Type 2 DM, HTN, and HLD who presented for hospital follow up.   In April hospitalized with NSTEMI and cath with severe 3-vessel disease with 80% RCA stenosis, 85% Prox LAD stenosis, and 90% 1st Mrg with 85% 2nd Mrg. CABG was recommended and this was performed on 4/4 with the LIMA-LAD, SVG-RCA, and SVG-OM2. She was started on ASA and Plavix.  She presented 10/04/15 to [facility] with complained of chest discomfort for the past 3 weeks. Upon arrival to the ED, she was noted to have ST elevation in the inferior and lateral leads of her EKG. Emergently to cardiac catheterization revealed Patent LIMA to the LAD, occluded SVG to OM, occluded SVG to RCA.  Dis. RCA 99% stenosed and she underwent emergent PCI with Promus DES to distal RCA. Her troponin peaked at 14 and then began trending downward. Plavix changed to [place]. Pt had persistent ST elevation inferiorly and tachycardia. Increased BB.Echo with EF 55-60% inferior hypokinesis and relatively hyperdynamic anterior wall motion.  Zocor changed to lipitor.   Presents today for follow up. Feeling well. No exertional or rest chest pain or sob. Denies orthopnea, PND or syncope. She is planning to restart classes at [employer] this fall (mostly online). She does not work. She is walking daily. Wants to resume Cardiac Rehab. She is going to see Dr. Buddy Duty about her diabetes and is going to see a nutritionist as well. Very motivated to take care of herself.    Past Medical History:  Diagnosis Date  . Acute ST elevation myocardial infarction (STEMI) involving right coronary artery ([place]) 10/04/2015  . Anxiety   . Anxiety  and depression   . CAD (coronary artery disease)    a. CABG 06/2015: LIMA-LAD, SVG-RCA, SVG-OM2 b. STEMI s/p PCI to distal RCA lesion with a small Promus Premier stent. ( patent LIMA to the LAD, occluded SVG to OM, occluded SVG to RCA.)  . CAD (coronary artery disease) of artery bypass graft; occluded SVG to RCA and occluded SVG to OM 10/08/2015  . Empty sella ([place])   . Hidradenitis axillaris   . Hyperlipemia   . Hypertension   . Kidney stones   . Lupus ([place]) dx'd 2008   "they think I have this; have to get retested" (06/12/2015)  . Obesity (BMI 30.0-34.9)   . Ovarian cyst   . Pneumonia X 2  . PONV (postoperative nausea and vomiting)   . 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 Rheumatoid arthritis ([place])   . S/P angioplasty with stent; 10/04/15 to distal RCA lesion. with Promus premier. 10/08/2015  . Scoliosis   . Stroke [facility]) March 2014   left MCA branches; "they say I have them all the time", denies residual on 06/12/2015  . Tobacco abuse   . Type II diabetes mellitus ([place])     Past Surgical History:  Procedure Laterality Date  . ABDOMINAL HYSTERECTOMY    . BREAST BIOPSY Bilateral   . CARDIAC CATHETERIZATION N/A 06/13/2015   Procedure: Left Heart Cath and Coronary Angiography;  Surgeon: Peter M Martinique, MD;  Location: [place] CV LAB;  Service: Cardiovascular;  Laterality: N/A;  .  CARDIAC CATHETERIZATION N/A 10/04/2015   Procedure: Left Heart Cath and Coronary Angiography;  Surgeon: [name], MD;  Location: [place] CV LAB;  Service: Cardiovascular;  Laterality: N/A;  . CARDIAC CATHETERIZATION N/A 10/04/2015   Procedure: Coronary Stent Intervention;  Surgeon: [name], MD;  Location: [place] CV LAB;  Service: Cardiovascular;  Laterality: N/A;  . CORONARY ARTERY BYPASS GRAFT N/A 06/19/2015   Procedure: CORONARY ARTERY BYPASS GRAFTING (CABG) TIMES FOUR USING LEFT INTERNAL MAMMARY, RIGHT SAPHENOUS LEG VEIN AND CRYO SAPHENOUS VEIN;  Surgeon: [name], MD;  Location:  [place];  Service: Open Heart Surgery;  Laterality: N/A;  . DILATION AND CURETTAGE OF UTERUS    . TEE WITHOUT CARDIOVERSION N/A 06/19/2015   Procedure: TRANSESOPHAGEAL ECHOCARDIOGRAM (TEE);  Surgeon: [name], MD;  Location: [place];  Service: Open Heart Surgery;  Laterality: N/A;  . TONSILLECTOMY  1960s  . TUBAL LIGATION      Current Medications: Prior to Admission medications   Medication Sig Start Date End Date Taking? Authorizing Provider  acetaminophen (TYLENOL) 325 MG tablet Take 2 tablets (650 mg total) by mouth every 4 (four) hours as needed for headache or mild pain. 10/08/15   Isaiah Serge, NP  aspirin EC 81 MG EC tablet Take 1 tablet (81 mg total) by mouth daily. 06/25/15   Donielle Liston [place], [name]  atorvastatin (LIPITOR) 80 MG tablet Take 1 tablet (80 mg total) by mouth daily at 6 PM. 10/08/15   Isaiah Serge, NP  Cholecalciferol (VITAMIN D3) 50000 units CAPS Take 50,000 Units by mouth once a week. 04/09/15   Historical Provider, MD  INVOKANA 300 MG TABS tablet Take 300 mg by mouth daily. 06/05/15   Historical Provider, MD  Iron-Vitamins (GERITOL COMPLETE PO) Take 1 tablet by mouth daily.    Historical Provider, MD  isosorbide mononitrate (IMDUR) 30 MG 24 hr tablet Take 0.5 tablets (15 mg total) by mouth daily. 10/08/15   Isaiah Serge, NP  JANUVIA 100 MG tablet Take 100 mg by mouth daily. 04/06/15   Historical Provider, MD  metFORMIN (GLUCOPHAGE) 1000 MG tablet Take 1,000 mg by mouth 2 (two) times daily. 04/06/15   Historical Provider, MD  Metoprolol Tartrate 37.5 MG TABS Take 37.5 mg by mouth 2 (two) times daily. 10/08/15   Isaiah Serge, NP  nitroGLYCERIN (NITROSTAT) 0.4 MG SL tablet Place 1 tablet (0.4 mg total) under the tongue every 5 (five) minutes as needed for chest pain. MAX 3 doses 10/03/15   Peter M Martinique, MD  ticagrelor (BRILINTA) 90 MG TABS tablet Take 1 tablet (90 mg total) by mouth 2 (two) times daily. 10/08/15   Isaiah Serge, NP  traMADol (ULTRAM) 50 MG tablet Take  1 tablet (50 mg total) by mouth every 6 (six) hours as needed. 09/10/15   Grace Isaac, MD    Allergies:   Review of patient's allergies indicates no known allergies.   Social History   Social History  . Marital status: Widowed    Spouse name: N/A  . Number of children: 2  . Years of education: college   Occupational History  . Disability    Social History Main Topics  . Smoking status: Former Smoker    Packs/day: 0.50    Years: 36.00    Types: Cigarettes    Quit date: 06/12/2014  . Smokeless tobacco: Never Used  . Alcohol use No  . Drug use: No  . Sexual activity: No  Other Topics Concern  . None   Social History Narrative   Patient currently lives at home with her disabled husband who was shot in 2009 and he is total care. She lives with her son and daughter and there is an 8 that comes in. Patient currently is on disability   She occasionally drinks caffeine.      Family History:  The patient's family history includes Diabetes in her mother; Heart failure in her mother; Stroke in her maternal aunt.   ROS:   Please see the history of present illness.    ROS All other systems reviewed and are negative.   PHYSICAL EXAM:   VS:  BP 118/82   Pulse 80   Ht 5\' 7"  (1.702 m)   Wt 205 lb 9.6 oz (93.3 kg)   SpO2 99%   BMI 32.20 kg/m    GEN: Well nourished, well developed, in no acute distress  HEENT: normal  Neck: no JVD, carotid bruits, or masses Cardiac: RRR; no murmurs, rubs, or gallops,no edema. Respiratory:  clear to auscultation bilaterally, normal work of breathing GI: soft, nontender, nondistended, + BS MS: no deformity or atrophy  Skin: warm and dry, no rash Neuro:  Alert and Oriented x 3, Strength and sensation are intact Psych: euthymic mood, full affect  Wt Readings from Last 3 Encounters:  10/25/15 205 lb 9.6 oz (93.3 kg)  10/12/15 209 lb (94.8 kg)  10/08/15 206 lb (93.4 kg)      Studies/Labs Reviewed:   EKG:  EKG is not ordered today.       Recent Labs: 06/13/2015: TSH 0.943 06/14/2015: ALT 21 06/20/2015: Magnesium 2.3 06/24/2015: Platelets 293 10/04/2015: Hemoglobin 16.0 10/07/2015: BUN 9; Creatinine, Ser 0.84; Potassium 4.5; Sodium 137   Lipid Panel    Component Value Date/Time   CHOL 160 10/05/2015 0305   TRIG 142 10/05/2015 0305   HDL 56 10/05/2015 0305   CHOLHDL 2.9 10/05/2015 0305   VLDL 28 10/05/2015 0305   LDLCALC 76 10/05/2015 0305    Additional studies/ records that were reviewed today include:   Echocardiogram: 10/05/15 LV EF: 55% -   60%  ------------------------------------------------------------------- Indications:      CAD of native vessels 414.01.  ------------------------------------------------------------------- History:   Risk factors:  Hypertension. Diabetes mellitus. Dyslipidemia.  ------------------------------------------------------------------- Study Conclusions  - Left ventricle: The cavity size was normal. Wall thickness was   increased in a pattern of mild LVH. Systolic function was normal.   The estimated ejection fraction was in the range of 55% to 60%.   Inferior wall hypokinesis. Doppler parameters are consistent with   abnormal left ventricular relaxation (grade 1 diastolic   dysfunction). The E/e&' ratio is between 8-15, suggesting   indeterminate LV filling pressure. - Ventricular septum: Septal motion showed abnormal function and   dyssynergy. - Aortic valve: Trileaflet. Sclerosis without stenosis. There was   no regurgitation. - Mitral valve: Mildly thickened leaflets . There was trivial   regurgitation. - Right ventricle: The cavity size was normal. Systolic function is   low normal. - Right atrium: The atrium was normal in size. - Tricuspid valve: There was trivial regurgitation. - Pulmonary arteries: PA peak pressure: 24 mm Hg (S). - Inferior vena cava: The vessel was normal in size. The   respirophasic diameter changes were in the normal range (>= 50%),    consistent with normal central venous pressure.  Impressions:  - Compared to a recent echo in 06/2015, the LVEF is stable at   55-60%,  however, there is inferior hypokinesis and relatively   hyperdynamic anterior wall motion.  Cardiac Catheterization: 10/04/15    Prox LAD lesion, 85% stenosed. Patent LIMA to LAD.  Severe native 3 vessel CAD.  Lat 1st Mrg-2 lesion, 100% stenosed. Occluded SVG to OM.  Ost RCA to Mid RCA lesion, 70% stenosed. Ocluded SVG to RCA,  Dist RCA lesion, 99% stenosed. Post intervention with a 2.25 x 12 Promus Premier, there is a 0% residual stenosis.  Normal LVEDP.   Switch to Brilinta from Plavix.  She needs aggressive secondary prevention.  Patient with residual ST elevation in the inferior leads but her chest pain has improved significantly.  Proximal RCA disease did not seem critical, compared to distal lesion.       ASSESSMENT & PLAN:    1. CAD s/p CABG 06/2015 - STEMI 10/04/15 with early loss of SVG to OM & SVG to RCA  s/p PCI to distal RCA lesion with a  Promus Premier stent. Patent LIMA to LAD.  Echo with EF 55-60% inferior hypokinesis and relatively hyperdynamic anterior wall motion.   -  She is asymptomatic currently. Will refer back to cardiac Rehab. - Continue ASA,  Brillinta (changed from plavix), statin, metoprolol and imdur.   2. HLD - 10/05/2015: Cholesterol 160; HDL 56; LDL Cholesterol 76; Triglycerides 142; VLDL 28  - Continue statin  3. HTN - BP stable and well controlled. Will continue BB and imdur. Would not resume  Olmesartan-amlodipine-Hctz today as bp stable. Will continue to follow.   4. DM - Managed by PCP/ Dr. Buddy Duty   Medication Adjustments/Labs and Tests Ordered: Current medicines are reviewed at length with the patient today.  Concerns regarding medicines are outlined above.  Medication changes, Labs and Tests ordered today are listed in the Patient Instructions below. Patient Instructions  Continue your current  therapy  Do some aerobic activity daily  We will refer you back to Cardiac Rehab.  I will see you in 3 months.    Signed, [name], MD  10/25/2015 10:29 AM    [facility] [address], [place], [place]  [address] Phone: [phone]; Fax: [phone]

## 2015-10-25 ENCOUNTER — Telehealth (HOSPITAL_COMMUNITY): Payer: Self-pay | Admitting: *Deleted

## 2015-10-25 ENCOUNTER — Ambulatory Visit (INDEPENDENT_AMBULATORY_CARE_PROVIDER_SITE_OTHER): Payer: Commercial Managed Care - HMO | Admitting: Cardiology

## 2015-10-25 ENCOUNTER — Encounter: Payer: Self-pay | Admitting: Cardiology

## 2015-10-25 VITALS — BP 118/82 | HR 80 | Ht 67.0 in | Wt 205.6 lb

## 2015-10-25 DIAGNOSIS — I2510 Atherosclerotic heart disease of native coronary artery without angina pectoris: Secondary | ICD-10-CM

## 2015-10-25 DIAGNOSIS — E1121 Type 2 diabetes mellitus with diabetic nephropathy: Secondary | ICD-10-CM | POA: Diagnosis not present

## 2015-10-25 DIAGNOSIS — E785 Hyperlipidemia, unspecified: Secondary | ICD-10-CM

## 2015-10-25 DIAGNOSIS — I10 Essential (primary) hypertension: Secondary | ICD-10-CM

## 2015-10-25 DIAGNOSIS — E1165 Type 2 diabetes mellitus with hyperglycemia: Secondary | ICD-10-CM

## 2015-10-25 NOTE — Patient Instructions (Signed)
Continue your current therapy  Do some aerobic activity daily  We will refer you back to Cardiac Rehab.  I will see you in 3 months.

## 2015-10-25 NOTE — Telephone Encounter (Signed)
Pt seen in follow up at the office by Dr Martinique. Pt ok to return to cardiac rehab after her 7/20 stemi, stent. Requested pt to please call to schedule appt.  Contact information provided. Cherre Huger, BSN

## 2015-11-02 ENCOUNTER — Other Ambulatory Visit: Payer: Self-pay | Admitting: Internal Medicine

## 2015-11-02 DIAGNOSIS — Z803 Family history of malignant neoplasm of breast: Secondary | ICD-10-CM

## 2015-11-02 DIAGNOSIS — Z1231 Encounter for screening mammogram for malignant neoplasm of breast: Secondary | ICD-10-CM

## 2015-11-04 ENCOUNTER — Other Ambulatory Visit: Payer: Self-pay | Admitting: Cardiology

## 2015-11-06 ENCOUNTER — Telehealth: Payer: Self-pay | Admitting: Cardiology

## 2015-11-06 ENCOUNTER — Telehealth: Payer: Self-pay | Admitting: *Deleted

## 2015-11-06 MED ORDER — TICAGRELOR 90 MG PO TABS: 90.0000 mg | ORAL_TABLET | Freq: Two times a day (BID) | ORAL | 1 refills | Status: DC

## 2015-11-06 NOTE — Telephone Encounter (Signed)
[facility] at [address] left a msg on the refill vm stating that the patients insurance is refusing to let the claim go through for the brilinta until they are notified that the patient no longer takes clopidogrel. The number provided to call was [phone] and patients id number is [id number]. The patient also left a msg requesting a call at [phone] to discuss. She stated that she will be out of medication on Wednesday. Thanks, MI

## 2015-11-06 NOTE — Telephone Encounter (Signed)
[facility] local [facility]

## 2015-11-06 NOTE — Telephone Encounter (Signed)
New message    Chrystal verbalized that a call is needed for the pt to keep receiving Berlinta 90mg  2x day she will be completely out of the medication 11/07/15    *STAT* If patient is at the pharmacy, call can be transferred to refill team.   1. Which medications need to be refilled? (please list name of each medication and dose if known) Berlinta 90mg   2. Which pharmacy/location (including street and city if local pharmacy) is medication to be sent to? [facility]rystal verbalized that the location is unknown) 3. Do they need a 30 day or 90 day supply? unknown

## 2015-11-07 ENCOUNTER — Encounter: Payer: Self-pay | Admitting: Cardiology

## 2015-11-07 DIAGNOSIS — L02412 Cutaneous abscess of left axilla: Secondary | ICD-10-CM | POA: Diagnosis not present

## 2015-11-07 DIAGNOSIS — E1121 Type 2 diabetes mellitus with diabetic nephropathy: Secondary | ICD-10-CM | POA: Diagnosis not present

## 2015-11-07 DIAGNOSIS — Z7984 Long term (current) use of oral hypoglycemic drugs: Secondary | ICD-10-CM | POA: Diagnosis not present

## 2015-11-07 NOTE — Telephone Encounter (Signed)
This encounter was created in error - please disregard.

## 2015-11-07 NOTE — Telephone Encounter (Signed)
Spoke with pt pharmacy, questions regarding plavix and brilinta answered. Urgent request take 24 hours to complete. Spoke with pt, aware of above, samples of brilinta placed at the front desk of the [address] office for pt to pick up today. Ref # [id number].

## 2015-11-07 NOTE — Telephone Encounter (Signed)
New message  Pt call requesting to speak with Rn. Pt states she is having a difficult time refilling her medication because the insurance company is not approving the med. Pt ask to speak with nurse further, but would like RN to call the insurance company and explain pt med issue. Please call back to discuss

## 2015-11-12 NOTE — Addendum Note (Signed)
Encounter addended by: Rien Marland D Randy Whitener on: 11/12/2015 10:40 AM<BR>    Actions taken: Visit Navigator Flowsheet section accepted

## 2015-11-20 ENCOUNTER — Other Ambulatory Visit: Payer: Self-pay | Admitting: *Deleted

## 2015-11-20 DIAGNOSIS — Z0000 Encounter for general adult medical examination without abnormal findings: Secondary | ICD-10-CM | POA: Diagnosis not present

## 2015-11-20 DIAGNOSIS — I10 Essential (primary) hypertension: Secondary | ICD-10-CM | POA: Diagnosis not present

## 2015-11-20 DIAGNOSIS — N760 Acute vaginitis: Secondary | ICD-10-CM | POA: Diagnosis not present

## 2015-11-20 DIAGNOSIS — E1121 Type 2 diabetes mellitus with diabetic nephropathy: Secondary | ICD-10-CM | POA: Diagnosis not present

## 2015-11-20 DIAGNOSIS — Z780 Asymptomatic menopausal state: Secondary | ICD-10-CM | POA: Diagnosis not present

## 2015-11-20 DIAGNOSIS — Z1239 Encounter for other screening for malignant neoplasm of breast: Secondary | ICD-10-CM | POA: Diagnosis not present

## 2015-11-20 DIAGNOSIS — Z23 Encounter for immunization: Secondary | ICD-10-CM | POA: Diagnosis not present

## 2015-11-20 DIAGNOSIS — I2510 Atherosclerotic heart disease of native coronary artery without angina pectoris: Secondary | ICD-10-CM | POA: Diagnosis not present

## 2015-11-20 DIAGNOSIS — Z1211 Encounter for screening for malignant neoplasm of colon: Secondary | ICD-10-CM | POA: Diagnosis not present

## 2015-11-20 DIAGNOSIS — E785 Hyperlipidemia, unspecified: Secondary | ICD-10-CM | POA: Diagnosis not present

## 2015-11-20 MED ORDER — ISOSORBIDE MONONITRATE ER 30 MG PO TB24: 15.0000 mg | ORAL_TABLET | Freq: Every day | ORAL | 3 refills | Status: DC

## 2015-11-20 MED ORDER — NITROGLYCERIN 0.4 MG SL SUBL
0.4000 mg | SUBLINGUAL_TABLET | SUBLINGUAL | 1 refills | Status: DC | PRN
Start: 2015-11-20 — End: 2016-11-18

## 2015-11-20 MED ORDER — ATORVASTATIN CALCIUM 80 MG PO TABS: ORAL_TABLET | ORAL | 3 refills | Status: DC

## 2015-11-20 MED ORDER — METOPROLOL TARTRATE 37.5 MG PO TABS: 37.5000 mg | ORAL_TABLET | Freq: Two times a day (BID) | ORAL | 3 refills | Status: DC

## 2015-11-22 ENCOUNTER — Encounter (HOSPITAL_COMMUNITY): Payer: Self-pay

## 2015-11-22 ENCOUNTER — Encounter (HOSPITAL_COMMUNITY)
Admission: RE | Admit: 2015-11-22 | Discharge: 2015-11-22 | Disposition: A | Discharge status: Home / Self Care | Payer: Commercial Managed Care - HMO | Source: Ambulatory Visit | Attending: Cardiology | Admitting: Cardiology

## 2015-11-22 VITALS — BP 124/88 | HR 67 | Ht 67.0 in | Wt 209.2 lb

## 2015-11-22 DIAGNOSIS — I214 Non-ST elevation (NSTEMI) myocardial infarction: Secondary | ICD-10-CM | POA: Insufficient documentation

## 2015-11-22 DIAGNOSIS — Z79899 Other long term (current) drug therapy: Secondary | ICD-10-CM | POA: Insufficient documentation

## 2015-11-22 DIAGNOSIS — Z951 Presence of aortocoronary bypass graft: Secondary | ICD-10-CM | POA: Diagnosis not present

## 2015-11-22 DIAGNOSIS — I2111 ST elevation (STEMI) myocardial infarction involving right coronary artery: Secondary | ICD-10-CM

## 2015-11-22 NOTE — Progress Notes (Signed)
Cardiac Individual Treatment Plan  Patient Details  Name: [name] [name] MRN: [id number] Date of Birth: [birth year] Referring Provider:   Flowsheet Row CARDIAC REHAB PHASE II ORIENTATION from 11/22/2015 in [place]  Referring Provider  Martinique, Peter MD      Initial Encounter Date:  [place] from 11/22/2015 in [place]  Date  11/22/15  Referring Provider  Martinique, Peter MD      Visit Diagnosis: 10/11/15 ST elevation myocardial infarction involving right coronary artery [facility])  Patient's Home Medications on Admission:  Current Outpatient Prescriptions:  .  acetaminophen (TYLENOL) 325 MG tablet, Take 2 tablets (650 mg total) by mouth every 4 (four) hours as needed for headache or mild pain., Disp: , Rfl:  .  aspirin EC 81 MG EC tablet, Take 1 tablet (81 mg total) by mouth daily., Disp: , Rfl:  .  atorvastatin (LIPITOR) 80 MG tablet, TAKE 1 TABLET BY MOUTH DAILY AT 6PM, Disp: 90 tablet, Rfl: 3 .  Biotin 1 MG CAPS, Take 2 capsules by mouth., Disp: , Rfl:  .  Cholecalciferol (VITAMIN D3) 50000 units CAPS, Take 50,000 Units by mouth once a week., Disp: , Rfl:  .  INVOKANA 300 MG TABS tablet, Take 300 mg by mouth daily., Disp: , Rfl:  .  Iron-Vitamins (GERITOL COMPLETE PO), Take 1 tablet by mouth daily., Disp: , Rfl:  .  isosorbide mononitrate (IMDUR) 30 MG 24 hr tablet, Take 0.5 tablets (15 mg total) by mouth daily., Disp: 45 tablet, Rfl: 3 .  JANUVIA 100 MG tablet, Take 100 mg by mouth daily., Disp: , Rfl:  .  metFORMIN (GLUCOPHAGE) 1000 MG tablet, Take 1,000 mg by mouth 2 (two) times daily., Disp: , Rfl:  .  Metoprolol Tartrate 37.5 MG TABS, Take 37.5 mg by mouth 2 (two) times daily., Disp: 180 tablet, Rfl: 3 .  nitroGLYCERIN (NITROSTAT) 0.4 MG SL tablet, Place 1 tablet (0.4 mg total) under the tongue every 5 (five) minutes as needed for chest pain. MAX 3 doses, Disp: 25 tablet, Rfl: 1 .   ticagrelor (BRILINTA) 90 MG TABS tablet, Take 1 tablet (90 mg total) by mouth 2 (two) times daily., Disp: 180 tablet, Rfl: 1 .  traMADol (ULTRAM) 50 MG tablet, Take 1 tablet (50 mg total) by mouth every 6 (six) hours as needed. (Patient not taking: Reported on 11/22/2015), Disp: 30 tablet, Rfl: 0  Past Medical History: Past Medical History:  Diagnosis Date  . Acute ST elevation myocardial infarction (STEMI) involving right coronary artery ([place]) 10/04/2015  . Anxiety   . Anxiety and depression   . CAD (coronary artery disease)    a. CABG 06/2015: LIMA-LAD, SVG-RCA, SVG-OM2 b. STEMI s/p PCI to distal RCA lesion with a small Promus Premier stent. ( patent LIMA to the LAD, occluded SVG to OM, occluded SVG to RCA.)  . CAD (coronary artery disease) of artery bypass graft; occluded SVG to RCA and occluded SVG to OM 10/08/2015  . Empty sella ([place])   . Hidradenitis axillaris   . Hyperlipemia   . Hypertension   . Kidney stones   . Lupus ([place]) dx'd 2008   "they think I have this; have to get retested" (06/12/2015)  . Obesity (BMI 30.0-34.9)   . Ovarian cyst   . Pneumonia X 2  . PONV (postoperative nausea and vomiting)   . 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 Rheumatoid arthritis ([place])   . S/P angioplasty  with stent; 10/04/15 to distal RCA lesion. with Promus premier. 10/08/2015  . Scoliosis   . Stroke [facility]) March 2014   left MCA branches; "they say I have them all the time", denies residual on 06/12/2015  . Tobacco abuse   . Type II diabetes mellitus (H[facility])     Tobacco Use: History  Smoking Status  . Former Smoker  . Packs/day: 0.50  . Years: 36.00  . Types: Cigarettes  . Quit date: 06/12/2014  Smokeless Tobacco  . Never Used    Labs: Recent Review Flowsheet Data    Labs for ITP Cardiac and Pulmonary Rehab Latest Ref Rng & Units 06/19/2015 06/20/2015 06/20/2015 10/04/2015 10/05/2015   Cholestrol 0 - 200 mg/dL - - - - 160   LDLCALC 0 - 99 mg/dL - - - - 76   HDL >40 mg/dL - - - - 56   Trlycerides <150  mg/dL - - - - 142   Hemoglobin A1c 4.8 - 5.6 % - - - - -   PHART 7.350 - 7.450 - 7.374 - - -   PCO2ART 35.0 - 45.0 mmHg - 44.0 - - -   HCO3 20.0 - 24.0 mEq/L - 25.4(H) - - -   TCO2 0 - 100 mmol/L 22 27 25 24  -   ACIDBASEDEF 0.0 - 2.0 mmol/L - - - - -   O2SAT % - 92.0 - - -      Capillary Blood Glucose: Lab Results  Component Value Date   GLUCAP 160 (H) 10/08/2015   GLUCAP 158 (H) 10/08/2015   GLUCAP 139 (H) 10/07/2015   GLUCAP 144 (H) 10/07/2015   GLUCAP 179 (H) 10/07/2015     Exercise Target Goals: Date: 11/22/15  Exercise Program Goal: Individual exercise prescription set with THRR, safety & activity barriers. Participant demonstrates ability to understand and report RPE using BORG scale, to self-measure pulse accurately, and to acknowledge the importance of the exercise prescription.  Exercise Prescription Goal: Starting with aerobic activity 30 plus minutes a day, 3 days per week for initial exercise prescription. Provide home exercise prescription and guidelines that participant acknowledges understanding prior to discharge.  Activity Barriers & Risk Stratification:     Activity Barriers & Cardiac Risk Stratification - 11/22/15 0822      Activity Barriers & Cardiac Risk Stratification   Activity Barriers Back Problems;Other (comment)   Comments R upper arm pain    Cardiac Risk Stratification High      6 Minute Walk:     6 Minute Walk    Row Name 08/28/15 1705 11/22/15 1228       6 Minute Walk   Phase Initial Initial    Distance 1610 feet 1328 feet    Walk Time 6 minutes 6 minutes    # of Rest Breaks 0 0    MPH 3.05 2.52    METS 4.13 3.3    RPE 11 11    VO2 Peak 14.45 11.56    Symptoms Yes (comment) No    Comments Bilateral ankle soreness secondary to RA and bone spurs.  -    Resting HR 91 bpm 67 bpm    Resting BP 138/88 124/88    Max Ex. HR 114 bpm 94 bpm    Max Ex. BP 144/90 132/90    2 Minute Post BP 132/90 134/80       Initial Exercise  Prescription:     Initial Exercise Prescription - 11/22/15 1200      Date of Initial  Exercise RX and Referring Provider   Date 11/22/15   Referring Provider Martinique, Peter MD     Bike   Level --   Minutes --   METs --     Recumbant Bike   Level 2   Minutes 10   METs 2     NuStep   Level 3   Minutes 10   METs 2     Track   Laps 10   Minutes 10   METs 2.74     Prescription Details   Frequency (times per week) 3   Duration Progress to 30 minutes of continuous aerobic without signs/symptoms of physical distress     Intensity   THRR 40-80% of Max Heartrate 65-131   Ratings of Perceived Exertion 11-13   Perceived Dyspnea 0-4     Progression   Progression Continue to progress workloads to maintain intensity without signs/symptoms of physical distress.     Resistance Training   Training Prescription Yes   Weight 2 lbs   Reps 10-12      Perform Capillary Blood Glucose checks as needed.  Exercise Prescription Changes:     Exercise Prescription Changes    Row Name 09/17/15 1500             Exercise Review   Progression Yes         Response to Exercise   Blood Pressure (Admit) 132/80       Blood Pressure (Exercise) 128/90       Blood Pressure (Exit) 120/60       Heart Rate (Exercise) 99 bpm       Heart Rate (Exit) 136 bpm       Oxygen Saturation (Admit) 99 %       Rating of Perceived Exertion (Exercise) 11       Duration Progress to 30 minutes of continuous aerobic without signs/symptoms of physical distress       Intensity THRR unchanged         Progression   Progression Continue to progress workloads to maintain intensity without signs/symptoms of physical distress.       Average METs 2.5         Resistance Training   Training Prescription Yes       Weight 3LBS       Reps 10-12         Treadmill   MPH -       Grade -       Minutes -       METs -         Bike   Level 1.2       Minutes 10       METs 3.38         NuStep   Level 3        Minutes 10       METs 2         Track   Laps 11       Minutes 10       METs 2.92          Exercise Comments:   Discharge Exercise Prescription (Final Exercise Prescription Changes):     Exercise Prescription Changes - 09/17/15 1500      Exercise Review   Progression Yes     Response to Exercise   Blood Pressure (Admit) 132/80   Blood Pressure (Exercise) 128/90   Blood Pressure (Exit) 120/60   Heart Rate (Exercise) 99 bpm  Heart Rate (Exit) 136 bpm   Oxygen Saturation (Admit) 99 %   Rating of Perceived Exertion (Exercise) 11   Duration Progress to 30 minutes of continuous aerobic without signs/symptoms of physical distress   Intensity THRR unchanged     Progression   Progression Continue to progress workloads to maintain intensity without signs/symptoms of physical distress.   Average METs 2.5     Resistance Training   Training Prescription Yes   Weight 3LBS   Reps 10-12     Treadmill   MPH --   Grade --   Minutes --   METs --     Bike   Level 1.2   Minutes 10   METs 3.38     NuStep   Level 3   Minutes 10   METs 2     Track   Laps 11   Minutes 10   METs 2.92      Nutrition:  Target Goals: Understanding of nutrition guidelines, daily intake of sodium 1500mg , cholesterol 200mg , calories 30% from fat and 7% or less from saturated fats, daily to have 5 or more servings of fruits and vegetables.  Biometrics:     Pre Biometrics - 11/22/15 1336      Pre Biometrics   Waist Circumference 44 inches   Hip Circumference 47.75 inches   Waist to Hip Ratio 0.92 %   Triceps Skinfold 45 mm   % Body Fat 35.7 %   Grip Strength 32.5 kg   Flexibility 8 in   Single Leg Stand 4.8 seconds       Nutrition Therapy Plan and Nutrition Goals:     Nutrition Therapy & Goals - 09/14/15 1456      Nutrition Therapy   Diet Carb Modified, Therapeutic Lifestyle Changes     Personal Nutrition Goals   Personal Goal #1 1-2 lb wt loss per week for a wt loss goal  of 6-24 lb   Personal Goal #2 Improved glycemic control as evidenced by a decrease in pt's HbA1c with an ultimate goal of < 7.0     Intervention Plan   Intervention Prescribe, educate and counsel regarding individualized specific dietary modifications aiming towards targeted core components such as weight, hypertension, lipid management, diabetes, heart failure and other comorbidities.;Nutrition handout(s) given to patient.  1500 kcal, 5 day menu ideas   Expected Outcomes Short Term Goal: Understand basic principles of dietary content, such as calories, fat, sodium, cholesterol and nutrients.;Long Term Goal: Adherence to prescribed nutrition plan.      Nutrition Discharge: Nutrition Scores:     Nutrition Assessments - 09/14/15 1457      MEDFICTS Scores   Pre Score --  pt has not returned nutrition survey      Nutrition Goals Re-Evaluation:   Psychosocial: Target Goals: Acknowledge presence or absence of depression, maximize coping skills, provide positive support system. Participant is able to verbalize types and ability to use techniques and skills needed for reducing stress and depression.  Initial Review & Psychosocial Screening:     Initial Psych Review & Screening - 08/28/15 1649      Initial Review   Current issues with History of Depression;Current Anxiety/Panic;Current Stress Concerns     Family Dynamics   Good Support System? No   Concerns No support system;Recent loss of significant other   Comments pt husband passed away one year ago, patient was his caregiver      Barriers   Psychosocial barriers to participate in program The patient should  benefit from training in stress management and relaxation.     Screening Interventions   Interventions Encouraged to exercise;Program counselor consult      Quality of Life Scores:     Quality of Life - 11/12/15 1039      Quality of Life Scores   Health/Function Post 24 %   Socioeconomic Post 21.43 %    Psych/Spiritual Post 21.43 %   Family Post 24 %   GLOBAL Post 22.91 %      PHQ-9: Recent Review Flowsheet Data    Depression screen [facility] 2/9 09/14/2015 09/03/2015 07/05/2015   Decreased Interest 0 0 0   Down, Depressed, Hopeless 1 1 0   PHQ - 2 Score 1 1 0      Psychosocial Evaluation and Intervention:     Psychosocial Evaluation - 09/14/15 1615      Discharge Psychosocial Assessment & Intervention   Discharge Continue support measures as needed   Comments pt encouraged to exercise on her own at home and continue stress management techniques       Psychosocial Re-Evaluation:   Vocational Rehabilitation: Provide vocational rehab assistance to qualifying candidates.   Vocational Rehab Evaluation & Intervention:     Vocational Rehab - 08/28/15 1650      Initial Vocational Rehab Evaluation & Intervention   Assessment shows need for Vocational Rehabilitation No      Education: Education Goals: Education classes will be provided on a weekly basis, covering required topics. Participant will state understanding/return demonstration of topics presented.  Learning Barriers/Preferences:     Learning Barriers/Preferences - 11/22/15 0827      Learning Barriers/Preferences   Learning Barriers Sight   Learning Preferences Skilled Demonstration;Verbal Instruction      Education Topics: Count Your Pulse:  -Group instruction provided by verbal instruction, demonstration, patient participation and written materials to support subject.  Instructors address importance of being able to find your pulse and how to count your pulse when at home without a heart monitor.  Patients get hands on experience counting their pulse with staff help and individually.   Heart Attack, Angina, and Risk Factor Modification:  -Group instruction provided by verbal instruction, video, and written materials to support subject.  Instructors address signs and symptoms of angina and heart attacks.    Also  discuss risk factors for heart disease and how to make changes to improve heart health risk factors. Flowsheet Row CARDIAC REHAB PHASE II EXERCISE from 09/14/2015 in [place]  Date  09/07/15  Instruction Review Code  2- meets goals/outcomes      Functional Fitness:  -Group instruction provided by verbal instruction, demonstration, patient participation, and written materials to support subject.  Instructors address safety measures for doing things around the house.  Discuss how to get up and down off the floor, how to pick things up properly, how to safely get out of a chair without assistance, and balance training.   Meditation and Mindfulness:  -Group instruction provided by verbal instruction, patient participation, and written materials to support subject.  Instructor addresses importance of mindfulness and meditation practice to help reduce stress and improve awareness.  Instructor also leads participants through a meditation exercise.    Stretching for Flexibility and Mobility:  -Group instruction provided by verbal instruction, patient participation, and written materials to support subject.  Instructors lead participants through series of stretches that are designed to increase flexibility thus improving mobility.  These stretches are additional exercise for major muscle groups that  are typically performed during regular warm up and cool down. Flowsheet Row CARDIAC REHAB PHASE II EXERCISE from 09/14/2015 in [place]  Date  09/05/15  Educator  Luetta Nutting Iline Oven  Instruction Review Code  2- meets goals/outcomes      Hands Only CPR Anytime:  -Group instruction provided by verbal instruction, video, patient participation and written materials to support subject.  Instructors co-teach with AHA video for hands only CPR.  Participants get hands on experience with mannequins.   Nutrition I class: Heart Healthy Eating:   -Group instruction provided by PowerPoint slides, verbal discussion, and written materials to support subject matter. The instructor gives an explanation and review of the Therapeutic Lifestyle Changes diet recommendations, which includes a discussion on lipid goals, dietary fat, sodium, fiber, plant stanol/sterol esters, sugar, and the components of a well-balanced, healthy diet. Flowsheet Row CARDIAC REHAB PHASE II EXERCISE from 09/14/2015 in [place]  Date  09/14/15  Educator  RD  Instruction Review Code  Not applicable [class handout given]      Nutrition II class: Lifestyle Skills:  -Group instruction provided by PowerPoint slides, verbal discussion, and written materials to support subject matter. The instructor gives an explanation and review of label reading, grocery shopping for heart health, heart healthy recipe modifications, and ways to make healthier choices when eating out. Flowsheet Row CARDIAC REHAB PHASE II EXERCISE from 09/14/2015 in [place]  Date  09/14/15  Educator  RD  Instruction Review Code  Not applicable [class handout given]      Diabetes Question & Answer:  -Group instruction provided by PowerPoint slides, verbal discussion, and written materials to support subject matter. The instructor gives an explanation and review of diabetes co-morbidities, pre- and post-prandial blood glucose goals, pre-exercise blood glucose goals, signs, symptoms, and treatment of hypoglycemia and hyperglycemia, and foot care basics. Flowsheet Row CARDIAC REHAB PHASE II EXERCISE from 09/14/2015 in [place]  Date  09/14/15  Educator  RD  Instruction Review Code  Not applicable [class handout given]      Diabetes Blitz:  -Group instruction provided by PowerPoint slides, verbal discussion, and written materials to support subject matter. The instructor gives an explanation and review of the  physiology behind type 1 and type 2 diabetes, diabetes medications and rational behind using different medications, pre- and post-prandial blood glucose recommendations and Hemoglobin A1c goals, diabetes diet, and exercise including blood glucose guidelines for exercising safely.  Flowsheet Row CARDIAC REHAB PHASE II EXERCISE from 09/14/2015 in [place]  Date  09/14/15  Educator  RD  Instruction Review Code  Not applicable [class handout given]      Portion Distortion:  -Group instruction provided by PowerPoint slides, verbal discussion, written materials, and food models to support subject matter. The instructor gives an explanation of serving size versus portion size, changes in portions sizes over the last 20 years, and what consists of a serving from each food group. Flowsheet Row CARDIAC REHAB PHASE II EXERCISE from 09/14/2015 in [place]  Date  09/12/15  Educator  RD  Instruction Review Code  2- meets goals/outcomes      Stress Management:  -Group instruction provided by verbal instruction, video, and written materials to support subject matter.  Instructors review role of stress in heart disease and how to cope with stress positively.     Exercising on Your Own:  -  Group instruction provided by verbal instruction, power point, and written materials to support subject.  Instructors discuss benefits of exercise, components of exercise, frequency and intensity of exercise, and end points for exercise.  Also discuss use of nitroglycerin and activating EMS.  Review options of places to exercise outside of rehab.  Review guidelines for sex with heart disease.   Cardiac Drugs I:  -Group instruction provided by verbal instruction and written materials to support subject.  Instructor reviews cardiac drug classes: antiplatelets, anticoagulants, beta blockers, and statins.  Instructor discusses reasons, side effects, and lifestyle  considerations for each drug class.   Cardiac Drugs II:  -Group instruction provided by verbal instruction and written materials to support subject.  Instructor reviews cardiac drug classes: angiotensin converting enzyme inhibitors (ACE-I), angiotensin II receptor blockers (ARBs), nitrates, and calcium channel blockers.  Instructor discusses reasons, side effects, and lifestyle considerations for each drug class.   Anatomy and Physiology of the Circulatory System:  -Group instruction provided by verbal instruction, video, and written materials to support subject.  Reviews functional anatomy of heart, how it relates to various diagnoses, and what role the heart plays in the overall system.   Knowledge Questionnaire Score:     Knowledge Questionnaire Score - 11/12/15 1022      Knowledge Questionnaire Score   Post Score 20/24      Core Components/Risk Factors/Patient Goals at Admission:     Personal Goals and Risk Factors at Admission - 11/22/15 0828      Core Components/Risk Factors/Patient Goals on Admission    Weight Management Obesity;Weight Loss;Yes   Intervention Weight Management: Develop a combined nutrition and exercise program designed to reach desired caloric intake, while maintaining appropriate intake of nutrient and fiber, sodium and fats, and appropriate energy expenditure required for the weight goal.;Weight Management: Provide education and appropriate resources to help participant work on and attain dietary goals.;Weight Management/Obesity: Establish reasonable short term and long term weight goals.;Obesity: Provide education and appropriate resources to help participant work on and attain dietary goals.   Expected Outcomes Long Term: Adherence to nutrition and physical activity/exercise program aimed toward attainment of established weight goal;Short Term: Continue to assess and modify interventions until short term weight is achieved;Weight Maintenance: Understanding of  the daily nutrition guidelines, which includes 25-35% calories from fat, 7% or less cal from saturated fats, less than 200mg  cholesterol, less than 1.5gm of sodium, & 5 or more servings of fruits and vegetables daily;Weight Loss: Understanding of general recommendations for a balanced deficit meal plan, which promotes 1-2 lb weight loss per week and includes a negative energy balance of [phone] kcal/d;Understanding recommendations for meals to include 15-35% energy as protein, 25-35% energy from fat, 35-60% energy from carbohydrates, less than 200mg  of dietary cholesterol, 20-35 gm of total fiber daily;Understanding of distribution of calorie intake throughout the day with the consumption of 4-5 meals/snacks   Increase Strength and Stamina Yes   Intervention Provide advice, education, support and counseling about physical activity/exercise needs.;Develop an individualized exercise prescription for aerobic and resistive training based on initial evaluation findings, risk stratification, comorbidities and participant's personal goals.   Expected Outcomes Achievement of increased cardiorespiratory fitness and enhanced flexibility, muscular endurance and strength shown through measurements of functional capacity and personal statement of participant.   Improve shortness of breath with ADL's Yes   Intervention Provide education, individualized exercise plan and daily activity instruction to help decrease symptoms of SOB with activities of daily living.   Expected Outcomes Short Term: Achieves  a reduction of symptoms when performing activities of daily living.   Diabetes Yes   Intervention Provide education about signs/symptoms and action to take for hypo/hyperglycemia.;Provide education about proper nutrition, including hydration, and aerobic/resistive exercise prescription along with prescribed medications to achieve blood glucose in normal ranges: Fasting glucose 65-99 mg/dL   Expected Outcomes Short Term:  Participant verbalizes understanding of the signs/symptoms and immediate care of hyper/hypoglycemia, proper foot care and importance of medication, aerobic/resistive exercise and nutrition plan for blood glucose control.;Long Term: Attainment of HbA1C < 7%.   Hypertension Yes   Intervention Provide education on lifestyle modifcations including regular physical activity/exercise, weight management, moderate sodium restriction and increased consumption of fresh fruit, vegetables, and low fat dairy, alcohol moderation, and smoking cessation.;Monitor prescription use compliance.   Expected Outcomes Short Term: Continued assessment and intervention until BP is < 140/13mm HG in hypertensive participants. < 130/67mm HG in hypertensive participants with diabetes, heart failure or chronic kidney disease.;Long Term: Maintenance of blood pressure at goal levels.   Lipids Yes   Intervention Provide education and support for participant on nutrition & aerobic/resistive exercise along with prescribed medications to achieve LDL 70mg , HDL >40mg .   Expected Outcomes Short Term: Participant states understanding of desired cholesterol values and is compliant with medications prescribed. Participant is following exercise prescription and nutrition guidelines.;Long Term: Cholesterol controlled with medications as prescribed, with individualized exercise RX and with personalized nutrition plan. Value goals: LDL < 70mg , HDL > 40 mg.   Stress Yes   Intervention Offer individual and/or small group education and counseling on adjustment to heart disease, stress management and health-related lifestyle change. Teach and support self-help strategies.;Refer participants experiencing significant psychosocial distress to appropriate mental health specialists for further evaluation and treatment. When possible, include family members and significant others in education/counseling sessions.   Expected Outcomes Short Term: Participant  demonstrates changes in health-related behavior, relaxation and other stress management skills, ability to obtain effective social support, and compliance with psychotropic medications if prescribed.;Long Term: Emotional wellbeing is indicated by absence of clinically significant psychosocial distress or social isolation.   Personal Goal Other Yes   Personal Goal To "get some me time" and focus on self. Gain independence and get life back   Intervention Provide stress management classes and coping mechanisms to assist with improving self assurance and self confidence   Expected Outcomes Pt will be able to live independently, get life back and focus on self      Core Components/Risk Factors/Patient Goals Review:    Core Components/Risk Factors/Patient Goals at Discharge (Final Review):    ITP Comments:     ITP Comments    Row Name 08/28/15 1642 11/22/15 0819         ITP Comments Dr. Fransico Him, MD Medical Director  Dr. Fransico Him, MD Medical Director          Comments: Pt in today for cardiac rehab orientation from 0800 to 1000.  As part of pt cardiac rehab orientation, pt completed 6 minute walk test.  Pt tolerated well with no complaints of cp. Pt with mild shortness of breath she attributes to taking Brillinta.  Pt with some mild right arm discomfort that she has reported to Dr. Martinique.  Felt not to be cardiac in nature and mostly from  musculoskeletal from her heart surgery and pulling her book bag for school.  Monitor showed SR with T wave inversion.  This is present on pt most recent 12 lead ekg.  The above reading  reflect pt post cardiac rehab with prior cardiac event.  Pt is to bring back her present  questionnaires  On her first full day of exercise.  Pt is looking forward to focusing on herself. Cherre Huger, BSN

## 2015-11-22 NOTE — Progress Notes (Signed)
Cardiac Rehab Medication Review by a Pharmacist  Does the patient  feel that his/her medications are working for him/her?  yes  Has the patient been experiencing any side effects to the medications prescribed?  yes  Does the patient measure his/her own blood pressure or blood glucose at home?  yes   Does the patient have any problems obtaining medications due to transportation or finances?   no  Understanding of regimen: good Understanding of indications: good Potential of compliance: good   Pharmacist comments: [address] presenting for orientation to cardiac rehab. She has good understanding of her drug regimen and does not have problems with cost, especially since going to mail order. She does have some nausea occasionally, but isn't sure if this is due to any medication since she has had sporadic nausea throughout her life. She does check her blood pressure and sugars at home.    Myer Peer Grayland Ormond), PharmD  PGY1 Pharmacy Resident Pager: [phone] 11/22/2015 8:54 AM

## 2015-11-23 ENCOUNTER — Other Ambulatory Visit: Payer: Self-pay

## 2015-11-23 NOTE — Patient Outreach (Signed)
[facility]) Care Management  11/23/2015  [name] [name] Feb 24, 1961 [id number]   REFERRAL SOURCE: [facility] REFERRAL REASON: 2 myocardial infarctions within the past 3 months.  Previously active with [facility] care management and could benefit from follow up related to diet,exerecise, and overall health concerns due to haviing 2 myocardial infarctions so close together.    Telephone call to patient regarding [facility] referral.  Unable to reach patient. HIPAA compliant voice message left with call back phone number.   Plan: RNCM will attempt 2nd telephone outreach to patient within 1 week.   Quinn Plowman RN,BSN,CCM [facility] Telephonic  [phone]

## 2015-11-26 ENCOUNTER — Encounter (HOSPITAL_COMMUNITY): Payer: Self-pay

## 2015-11-26 ENCOUNTER — Other Ambulatory Visit: Payer: Self-pay

## 2015-11-26 ENCOUNTER — Encounter (HOSPITAL_COMMUNITY): Payer: Commercial Managed Care - HMO

## 2015-11-26 NOTE — Patient Outreach (Signed)
[facility]) Care Management  11/26/2015  [name] [name] 10-07-1960 [id number]   REFERRAL SOURCE: [facility] REFERRAL REASON: 2 myocardial infarctions within the past 3 months.  Previously active with [facility] care management and could benefit from follow up related to diet,exerecise, and overall health concerns due to haviing 2 myocardial infarctions so close together.    Second telephone call to patient regarding [facility] referral.  Unable to reach.  HIPAA compliant voice message left with call back phone number.   PLAN; RNCM will attempt 3rd telephone call to patient within  1 week.  Quinn Plowman RN,BSN,CCM [facility] Telephonic  [phone]

## 2015-11-27 ENCOUNTER — Encounter: Payer: Commercial Managed Care - HMO | Attending: Internal Medicine | Admitting: Skilled Nursing Facility1

## 2015-11-27 ENCOUNTER — Encounter: Payer: Self-pay | Admitting: Skilled Nursing Facility1

## 2015-11-27 DIAGNOSIS — E1121 Type 2 diabetes mellitus with diabetic nephropathy: Secondary | ICD-10-CM | POA: Insufficient documentation

## 2015-11-27 DIAGNOSIS — E1165 Type 2 diabetes mellitus with hyperglycemia: Secondary | ICD-10-CM

## 2015-11-27 DIAGNOSIS — Z713 Dietary counseling and surveillance: Secondary | ICD-10-CM | POA: Diagnosis not present

## 2015-11-27 NOTE — Patient Instructions (Addendum)
-  Try cooking your fish on the gill on the porch or cooking it in parchment paper with rosemary and olive oil --Always bring your meter with you everywhere you go -Always Properly dispose of your needles:  -Discard in a hard plastic/metal container with a lid (something the needle can't puncture)  -Write Do Not Recycle on the outside of the container  -Example: A laundry detergent bottle -Never use the same needle more than once -Eat 2-3 carbohydrate choices for each meal and 1 for each snack -A meal: carbohydrates, protein, vegetable -A snack: A Fruit OR Vegetable AND Protein  -Try to be more active -Always pay attention to your body keeping watchful of possible low blood sugar (below 70) or high blood sugar (above 200)  -For trail mix list: peanuts, raisins, sunflower seeds, cashews, pumpkin seeds, dark chocolate morsal  -Check your blood sugar every day and at different times throughout the day and write down what you have eaten  -Work on adapting stress relief mechanisms  -Test your blood sugar when you wake up at 2-3am with hunger

## 2015-11-27 NOTE — Progress Notes (Signed)
Diabetes Self-Management Education  Visit Type: First/Initial  Appt. Start Time: 2:00 Appt. End Time: 3:50  11/27/2015  [name], identified by name and date of birth, is a 55 y.o. female with a diagnosis of Diabetes: Type 2.   ASSESSMENT  Height 5\' 7"  (1.702 m), weight 206 lb (93.4 kg). Body mass index is 32.26 kg/m. Pt states she loves sweet tea. Pt states she starts cardiac rehab tomorrow. Pt states her husband passed away a year ago. Pt states she wants to write a book about caring for her husband who had sepsis from a gunshot wound. Pt states her physician told her she has a bacterial infection/sepsis. Pt states she struggles financially but does not qualify for food stamps so her children provide food for her but their food stamps are getting cut. Pt states she is in a college program currently. Pt states her first heart attack felt like she had the flu. Pt states one of her sons is in prison and the other son lives with her and his family. Pt states she wants to live alone. Pt states she feels like there are too many people in her space bothering her. Pt states she is lactose intolerant. Pt states she has been working on decreasing her sugar intake. Pt states she loves fish but she cannot have it because her daughter in law is allergic as well as shellfish due to her son being allergic. Pt states her physician wants her to test her blood sugar 7 times. Pt states her numbers have not been over 120-checking a couple times a week but never after a meal.  Pt is talkative.  For next visit: diet information.      Diabetes Self-Management Education - 11/27/15 1413      Visit Information   Visit Type First/Initial     Initial Visit   Diabetes Type Type 2   Are you currently following a meal plan? No   Are you taking your medications as prescribed? Yes   Date Diagnosed March 2014     Health Coping   How would you rate your overall health? Fair     Psychosocial Assessment    Patient Belief/Attitude about Diabetes Motivated to manage diabetes     Pre-Education Assessment   Patient understands the diabetes disease and treatment process. Needs Instruction   Patient understands incorporating nutritional management into lifestyle. Needs Instruction   Patient undertands incorporating physical activity into lifestyle. Needs Instruction   Patient understands using medications safely. Needs Instruction   Patient understands monitoring blood glucose, interpreting and using results Needs Instruction   Patient understands prevention, detection, and treatment of acute complications. Needs Instruction   Patient understands prevention, detection, and treatment of chronic complications. Needs Instruction   Patient understands how to develop strategies to address psychosocial issues. Needs Instruction   Patient understands how to develop strategies to promote health/change behavior. Needs Instruction     Complications   Last HgB A1C per patient/outside source 7.6 %   Have you had a dilated eye exam in the past 12 months? No   Have you had a dental exam in the past 12 months? No   Are you checking your feet? Yes   How many days per week are you checking your feet? 7     Dietary Intake   Breakfast none----maybe fruit somtimes or enlgish muffin with peanut butter----eggs   Beverage(s) sweet tea     Exercise   Exercise Type Light (walking / raking leaves)  How many days per week to you exercise? 7     Patient Education   Previous Diabetes Education No   Disease state  Definition of diabetes, type 1 and 2, and the diagnosis of diabetes;Factors that contribute to the development of diabetes   Nutrition management  Role of diet in the treatment of diabetes and the relationship between the three main macronutrients and blood glucose level;Food label reading, portion sizes and measuring food.;Carbohydrate counting;Reviewed blood glucose goals for pre and post meals and how to  evaluate the patients' food intake on their blood glucose level.;Meal timing in regards to the patients' current diabetes medication.;Information on hints to eating out and maintain blood glucose control.   Physical activity and exercise  Role of exercise on diabetes management, blood pressure control and cardiac health.   Monitoring Taught/evaluated SMBG meter.;Purpose and frequency of SMBG.;Yearly dilated eye exam;Daily foot exams;Identified appropriate SMBG and/or A1C goals.   Chronic complications Relationship between chronic complications and blood glucose control;Lipid levels, blood glucose control and heart disease;Dental care;Nephropathy, what it is, prevention of, the use of ACE, ARB's and early detection of through urine microalbumia.;Reviewed with patient heart disease, higher risk of, and prevention;Retinopathy and reason for yearly dilated eye exams;Assessed and discussed foot care and prevention of foot problems   Psychosocial adjustment Worked with patient to identify barriers to care and solutions;Role of stress on diabetes     Individualized Goals (developed by patient)   Nutrition Follow meal plan discussed;Adjust meds/carbs with exercise as discussed   Physical Activity Exercise 5-7 days per week;30 minutes per day   Monitoring  test blood glucose pre and post meals as discussed     Post-Education Assessment   Patient understands the diabetes disease and treatment process. Demonstrates understanding / competency   Patient understands incorporating nutritional management into lifestyle. Demonstrates understanding / competency   Patient undertands incorporating physical activity into lifestyle. Demonstrates understanding / competency   Patient understands using medications safely. Demonstrates understanding / competency   Patient understands monitoring blood glucose, interpreting and using results Demonstrates understanding / competency   Patient understands prevention, detection,  and treatment of acute complications. Demonstrates understanding / competency   Patient understands prevention, detection, and treatment of chronic complications. Demonstrates understanding / competency   Patient understands how to develop strategies to address psychosocial issues. Demonstrates understanding / competency   Patient understands how to develop strategies to promote health/change behavior. Demonstrates understanding / competency     Outcomes   Expected Outcomes Demonstrated interest in learning. Expect positive outcomes   Future DMSE PRN   Program Status Completed      Individualized Plan for Diabetes Self-Management Training:   Learning Objective:  Patient will have a greater understanding of diabetes self-management. Patient education plan is to attend individual and/or group sessions per assessed needs and concerns.   Plan:   Patient Instructions  -Try cooking your fish on the gill on the porch or cooking it in parchment paper with rosemary and olive oil --Always bring your meter with you everywhere you go -Always Properly dispose of your needles:  -Discard in a hard plastic/metal container with a lid (something the needle can't puncture)  -Write Do Not Recycle on the outside of the container  -Example: A laundry detergent bottle -Never use the same needle more than once -Eat 2-3 carbohydrate choices for each meal and 1 for each snack -A meal: carbohydrates, protein, vegetable -A snack: A Fruit OR Vegetable AND Protein  -Try to be more active -  Always pay attention to your body keeping watchful of possible low blood sugar (below 70) or high blood sugar (above 200)  -For trail mix list: peanuts, raisins, sunflower seeds, cashews, pumpkin seeds, dark chocolate morsal  -Check your blood sugar every day and at different times throughout the day and write down what you have eaten  -   Expected Outcomes:  Demonstrated interest in learning. Expect positive  outcomes  Education material provided: Living Well with Diabetes, Meal plan card, My Plate, Snack sheet and Support group flyer  If problems or questions, patient to contact team via:  Phone  Future DSME appointment: PRN

## 2015-11-28 ENCOUNTER — Encounter (HOSPITAL_COMMUNITY)
Admission: RE | Admit: 2015-11-28 | Discharge: 2015-11-28 | Disposition: A | Discharge status: Home / Self Care | Payer: Commercial Managed Care - HMO | Source: Ambulatory Visit | Attending: Cardiology | Admitting: Cardiology

## 2015-11-28 DIAGNOSIS — I214 Non-ST elevation (NSTEMI) myocardial infarction: Secondary | ICD-10-CM | POA: Diagnosis not present

## 2015-11-28 DIAGNOSIS — Z79899 Other long term (current) drug therapy: Secondary | ICD-10-CM | POA: Diagnosis not present

## 2015-11-28 DIAGNOSIS — I2111 ST elevation (STEMI) myocardial infarction involving right coronary artery: Secondary | ICD-10-CM

## 2015-11-28 DIAGNOSIS — Z951 Presence of aortocoronary bypass graft: Secondary | ICD-10-CM | POA: Diagnosis not present

## 2015-11-28 LAB — GLUCOSE, CAPILLARY
Glucose-Capillary: 120 mg/dL — ABNORMAL HIGH (ref 65–99)
Glucose-Capillary: 98 mg/dL (ref 65–99)

## 2015-11-28 NOTE — Progress Notes (Signed)
Daily Session Note  Patient Details  Name: [name] [name] MRN: [id number] Date of Birth: 10/31/1960 Referring Provider:   Flowsheet Row CARDIAC REHAB PHASE II ORIENTATION from 11/22/2015 in [place]  Referring Provider  Martinique, Peter MD      Encounter Date: 11/28/2015  Check In:   Capillary Blood Glucose: Results for orders placed or performed during the hospital encounter of 11/28/15 (from the past 24 hour(s))  Glucose, capillary     Status: None   Collection Time: 11/28/15  9:39 AM  Result Value Ref Range   Glucose-Capillary 98 65 - 99 mg/dL     Goals Met:  Exercise tolerated well Personal goals reviewed  Goals Unmet:  Not Applicable  Comments:  Pt started cardiac rehab today.  Pt tolerated light exercise without difficulty for exercise but did cut her third station due to headache. VSS slight elevation in bp. Medication list reconciled. Pt denies barriers to medicaiton compliance.  PSYCHOSOCIAL ASSESSMENT:  PHQ-0. Pt exhibits positive coping skills, hopeful outlook with supportive family. Pt admits to lessen anxiety due to having cardiac events so close together. Pt makes a conscious effort to remain positive and upbeat. Pt is in college and taking courses at [employer].  Pt is struggling a bit in her computer class but is seeking the assistance of tutors. Pt with financial difficulties regarding heart healthy choices.  Pt must depend on her family for food purchases.  Discussed pt needs with dietician. Pt given food from the cardiac rehab food pantry.    Pt enjoys reading, listening to music and cooking.Pt desires to get some me time prioritize self and gain independence with getting her life back. Pt is hopeful participation in cardiac rehab will help her in this process.   Pt oriented to exercise equipment and routine.    Understanding verbalized. Maurice Small RN, BSN  Dr. Fransico Him is Medical Director for Cardiac Rehab at [facility].

## 2015-11-29 ENCOUNTER — Other Ambulatory Visit: Payer: Self-pay

## 2015-11-29 ENCOUNTER — Encounter: Payer: Self-pay | Admitting: Skilled Nursing Facility1

## 2015-11-29 NOTE — Patient Outreach (Signed)
[facility]) Care Management  11/29/2015  [name] [name] 08/12/1960 [id number]   REFERRAL SOURCE: [facility] REFERRAL REASON: 2 myocardial infarctions within the past 3 months. Previously active with [facility] care management and could benefit from follow up related to diet,exerecise, and overall health concerns due to haviing 2 myocardial infarctions so close together.   Third telephone call to patient regarding [facility] referral. Unable to reach.  HIPAA compliant voice message left with call back phone number  PLAN; RNCM will send patient outreach letter.   Quinn Plowman RN,BSN,CCM [facility] Telephonic  [phone]

## 2015-11-30 ENCOUNTER — Encounter (HOSPITAL_COMMUNITY)
Admission: RE | Admit: 2015-11-30 | Discharge: 2015-11-30 | Disposition: A | Discharge status: Home / Self Care | Payer: Commercial Managed Care - HMO | Source: Ambulatory Visit | Attending: Cardiology | Admitting: Cardiology

## 2015-11-30 DIAGNOSIS — Z79899 Other long term (current) drug therapy: Secondary | ICD-10-CM | POA: Diagnosis not present

## 2015-11-30 DIAGNOSIS — Z951 Presence of aortocoronary bypass graft: Secondary | ICD-10-CM

## 2015-11-30 DIAGNOSIS — I214 Non-ST elevation (NSTEMI) myocardial infarction: Secondary | ICD-10-CM | POA: Diagnosis not present

## 2015-11-30 DIAGNOSIS — I2111 ST elevation (STEMI) myocardial infarction involving right coronary artery: Secondary | ICD-10-CM

## 2015-11-30 LAB — GLUCOSE, CAPILLARY
Glucose-Capillary: 112 mg/dL — ABNORMAL HIGH (ref 65–99)
Glucose-Capillary: 129 mg/dL — ABNORMAL HIGH (ref 65–99)

## 2015-11-30 NOTE — Progress Notes (Addendum)
Pt c/o ear pain with exercise at cardiac rehab while riding recumbent bicycle. Pt rates pain 5/10.  BP:  130/80, sinus rhythm, no acute ST-T changes on telemetry. Pain resolved immediately with rest. Exercise stopped. Pt describes as her anginal equivalent, however denies other episodes of pain since her cardiac event. Will continue to monitor. Pt instructed on proper NTG use including when to call 911. Understanding verbalized.

## 2015-11-30 NOTE — Progress Notes (Signed)
Cardiac Individual Treatment Plan  Patient Details  Name: [name] [name] MRN: [id number] Date of Birth: 1960/07/06 Referring Provider:   Flowsheet Row CARDIAC REHAB PHASE II ORIENTATION from 11/22/2015 in [place]  Referring Provider  Martinique, Peter MD      Initial Encounter Date:  [place] from 11/22/2015 in [place]  Date  11/22/15  Referring Provider  Martinique, Peter MD      Visit Diagnosis: 10/11/15 ST elevation myocardial infarction involving right coronary artery (HCC)  S/P CABG (coronary artery bypass graft)  Patient's Home Medications on Admission:  Current Outpatient Prescriptions:  .  acetaminophen (TYLENOL) 325 MG tablet, Take 2 tablets (650 mg total) by mouth every 4 (four) hours as needed for headache or mild pain., Disp: , Rfl:  .  aspirin EC 81 MG EC tablet, Take 1 tablet (81 mg total) by mouth daily., Disp: , Rfl:  .  atorvastatin (LIPITOR) 80 MG tablet, TAKE 1 TABLET BY MOUTH DAILY AT 6PM, Disp: 90 tablet, Rfl: 3 .  Biotin 1 MG CAPS, Take 2 capsules by mouth., Disp: , Rfl:  .  Cholecalciferol (VITAMIN D3) 50000 units CAPS, Take 50,000 Units by mouth once a week., Disp: , Rfl:  .  INVOKANA 300 MG TABS tablet, Take 300 mg by mouth daily., Disp: , Rfl:  .  Iron-Vitamins (GERITOL COMPLETE PO), Take 1 tablet by mouth daily., Disp: , Rfl:  .  isosorbide mononitrate (IMDUR) 30 MG 24 hr tablet, Take 0.5 tablets (15 mg total) by mouth daily., Disp: 45 tablet, Rfl: 3 .  JANUVIA 100 MG tablet, Take 100 mg by mouth daily., Disp: , Rfl:  .  metFORMIN (GLUCOPHAGE) 1000 MG tablet, Take 1,000 mg by mouth 2 (two) times daily., Disp: , Rfl:  .  Metoprolol Tartrate 37.5 MG TABS, Take 37.5 mg by mouth 2 (two) times daily., Disp: 180 tablet, Rfl: 3 .  nitroGLYCERIN (NITROSTAT) 0.4 MG SL tablet, Place 1 tablet (0.4 mg total) under the tongue every 5 (five) minutes as needed for chest  pain. MAX 3 doses, Disp: 25 tablet, Rfl: 1 .  ticagrelor (BRILINTA) 90 MG TABS tablet, Take 1 tablet (90 mg total) by mouth 2 (two) times daily., Disp: 180 tablet, Rfl: 1 .  traMADol (ULTRAM) 50 MG tablet, Take 1 tablet (50 mg total) by mouth every 6 (six) hours as needed., Disp: 30 tablet, Rfl: 0  Past Medical History: Past Medical History:  Diagnosis Date  . Acute ST elevation myocardial infarction (STEMI) involving right coronary artery ([place]) 10/04/2015  . Anxiety   . Anxiety and depression   . CAD (coronary artery disease)    a. CABG 06/2015: LIMA-LAD, SVG-RCA, SVG-OM2 b. STEMI s/p PCI to distal RCA lesion with a small Promus Premier stent. ( patent LIMA to the LAD, occluded SVG to OM, occluded SVG to RCA.)  . CAD (coronary artery disease) of artery bypass graft; occluded SVG to RCA and occluded SVG to OM 10/08/2015  . Empty sella ([place])   . Hidradenitis axillaris   . Hyperlipemia   . Hypertension   . Kidney stones   . Lupus ([place]) dx'd 2008   "they think I have this; have to get retested" (06/12/2015)  . Obesity (BMI 30.0-34.9)   . Ovarian cyst   . Pneumonia X 2  . PONV (postoperative nausea and vomiting)   . 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 Rheumatoid arthritis ([place])   . S/P  angioplasty with stent; 10/04/15 to distal RCA lesion. with Promus premier. 10/08/2015  . Scoliosis   . Stroke [facility]) March 2014   left MCA branches; "they say I have them all the time", denies residual on 06/12/2015  . Tobacco abuse   . Type II diabetes mellitus (HCC)     Tobacco Use: History  Smoking Status  . Former Smoker  . Packs/day: 0.50  . Years: 36.00  . Types: Cigarettes  . Quit date: 06/12/2014  Smokeless Tobacco  . Never Used    Labs: Recent Review Flowsheet Data    Labs for ITP Cardiac and Pulmonary Rehab Latest Ref Rng & Units 06/19/2015 06/20/2015 06/20/2015 10/04/2015 10/05/2015   Cholestrol 0 - 200 mg/dL - - - - 160   LDLCALC 0 - 99 mg/dL - - - - 76   HDL >40 mg/dL - - - - 56   Trlycerides  <150 mg/dL - - - - 142   Hemoglobin A1c 4.8 - 5.6 % - - - - -   PHART 7.350 - 7.450 - 7.374 - - -   PCO2ART 35.0 - 45.0 mmHg - 44.0 - - -   HCO3 20.0 - 24.0 mEq/L - 25.4(H) - - -   TCO2 0 - 100 mmol/L 22 27 25 24  -   ACIDBASEDEF 0.0 - 2.0 mmol/L - - - - -   O2SAT % - 92.0 - - -      Capillary Blood Glucose: Lab Results  Component Value Date   GLUCAP 112 (H) 11/30/2015   GLUCAP 129 (H) 11/30/2015   GLUCAP 98 11/28/2015   GLUCAP 120 (H) 11/28/2015   GLUCAP 160 (H) 10/08/2015     Exercise Target Goals:    Exercise Program Goal: Individual exercise prescription set with THRR, safety & activity barriers. Participant demonstrates ability to understand and report RPE using BORG scale, to self-measure pulse accurately, and to acknowledge the importance of the exercise prescription.  Exercise Prescription Goal: Starting with aerobic activity 30 plus minutes a day, 3 days per week for initial exercise prescription. Provide home exercise prescription and guidelines that participant acknowledges understanding prior to discharge.  Activity Barriers & Risk Stratification:     Activity Barriers & Cardiac Risk Stratification - 11/22/15 0822      Activity Barriers & Cardiac Risk Stratification   Activity Barriers Back Problems;Other (comment)   Comments R upper arm pain    Cardiac Risk Stratification High      6 Minute Walk:     6 Minute Walk    Row Name 08/28/15 1705 11/22/15 1228       6 Minute Walk   Phase Initial Initial    Distance 1610 feet 1328 feet    Walk Time 6 minutes 6 minutes    # of Rest Breaks 0 0    MPH 3.05 2.52    METS 4.13 3.3    RPE 11 11    VO2 Peak 14.45 11.56    Symptoms Yes (comment) No    Comments Bilateral ankle soreness secondary to RA and bone spurs.  -    Resting HR 91 bpm 67 bpm    Resting BP 138/88 124/88    Max Ex. HR 114 bpm 94 bpm    Max Ex. BP 144/90 132/90    2 Minute Post BP 132/90 134/80       Initial Exercise Prescription:      Initial Exercise Prescription - 11/22/15 1200      Date of Initial  Exercise RX and Referring Provider   Date 11/22/15   Referring Provider Martinique, Peter MD     Bike   Level --   Minutes --   METs --     Recumbant Bike   Level 2   Minutes 10   METs 2     NuStep   Level 3   Minutes 10   METs 2     Track   Laps 10   Minutes 10   METs 2.74     Prescription Details   Frequency (times per week) 3   Duration Progress to 30 minutes of continuous aerobic without signs/symptoms of physical distress     Intensity   THRR 40-80% of Max Heartrate 65-131   Ratings of Perceived Exertion 11-13   Perceived Dyspnea 0-4     Progression   Progression Continue to progress workloads to maintain intensity without signs/symptoms of physical distress.     Resistance Training   Training Prescription Yes   Weight 2 lbs   Reps 10-12      Perform Capillary Blood Glucose checks as needed.  Exercise Prescription Changes:     Exercise Prescription Changes    Row Name 09/17/15 1500 11/29/15 1100           Exercise Review   Progression Yes Yes        Response to Exercise   Blood Pressure (Admit) 132/80 128/90      Blood Pressure (Exercise) 128/90 142/80      Blood Pressure (Exit) 120/60 138/84      Heart Rate (Admit)  - 82 bpm      Heart Rate (Exercise) 99 bpm 103 bpm      Heart Rate (Exit) 136 bpm 83 bpm      Oxygen Saturation (Admit) 99 %  -      Rating of Perceived Exertion (Exercise) 11 11      Duration Progress to 30 minutes of continuous aerobic without signs/symptoms of physical distress Progress to 30 minutes of continuous aerobic without signs/symptoms of physical distress      Intensity THRR unchanged THRR unchanged        Progression   Progression Continue to progress workloads to maintain intensity without signs/symptoms of physical distress. Continue to progress workloads to maintain intensity without signs/symptoms of physical distress.      Average METs 2.5  2.6        Resistance Training   Training Prescription Yes Yes      Weight 3LBS 2 lbs      Reps 10-12 10-12        Treadmill   MPH -  -      Grade -  -      Minutes -  -      METs -  -        Bike   Level 1.2  -      Minutes 10  -      METs 3.38  -        Recumbant Bike   Level  - 2      Minutes  - 10      METs  - 2        NuStep   Level 3 3      Minutes 10 10      METs 2 2.1        Track   Laps 11 13      Minutes 10 10  METs 2.92 3.26         Exercise Comments:     Exercise Comments    Row Name 11/29/15 1121           Exercise Comments Pt is tolerating exercise well; there are no changes to current Ex Rx. Will continue to monitor pt progress with activity/exercisr          Discharge Exercise Prescription (Final Exercise Prescription Changes):     Exercise Prescription Changes - 11/29/15 1100      Exercise Review   Progression Yes     Response to Exercise   Blood Pressure (Admit) 128/90   Blood Pressure (Exercise) 142/80   Blood Pressure (Exit) 138/84   Heart Rate (Admit) 82 bpm   Heart Rate (Exercise) 103 bpm   Heart Rate (Exit) 83 bpm   Rating of Perceived Exertion (Exercise) 11   Duration Progress to 30 minutes of continuous aerobic without signs/symptoms of physical distress   Intensity THRR unchanged     Progression   Progression Continue to progress workloads to maintain intensity without signs/symptoms of physical distress.   Average METs 2.6     Resistance Training   Training Prescription Yes   Weight 2 lbs   Reps 10-12     Recumbant Bike   Level 2   Minutes 10   METs 2     NuStep   Level 3   Minutes 10   METs 2.1     Track   Laps 13   Minutes 10   METs 3.26      Nutrition:  Target Goals: Understanding of nutrition guidelines, daily intake of sodium 1500mg , cholesterol 200mg , calories 30% from fat and 7% or less from saturated fats, daily to have 5 or more servings of fruits and vegetables.  Biometrics:      Pre Biometrics - 11/22/15 1336      Pre Biometrics   Waist Circumference 44 inches   Hip Circumference 47.75 inches   Waist to Hip Ratio 0.92 %   Triceps Skinfold 45 mm   % Body Fat 35.7 %   Grip Strength 32.5 kg   Flexibility 8 in   Single Leg Stand 4.8 seconds       Nutrition Therapy Plan and Nutrition Goals:     Nutrition Therapy & Goals - 09/14/15 1456      Nutrition Therapy   Diet Carb Modified, Therapeutic Lifestyle Changes     Personal Nutrition Goals   Personal Goal #1 1-2 lb wt loss per week for a wt loss goal of 6-24 lb   Personal Goal #2 Improved glycemic control as evidenced by a decrease in pt's HbA1c with an ultimate goal of < 7.0     Intervention Plan   Intervention Prescribe, educate and counsel regarding individualized specific dietary modifications aiming towards targeted core components such as weight, hypertension, lipid management, diabetes, heart failure and other comorbidities.;Nutrition handout(s) given to patient.  1500 kcal, 5 day menu ideas   Expected Outcomes Short Term Goal: Understand basic principles of dietary content, such as calories, fat, sodium, cholesterol and nutrients.;Long Term Goal: Adherence to prescribed nutrition plan.      Nutrition Discharge: Nutrition Scores:     Nutrition Assessments - 11/23/15 1359      MEDFICTS Scores   Pre Score 6      Nutrition Goals Re-Evaluation:   Psychosocial: Target Goals: Acknowledge presence or absence of depression, maximize coping skills, provide positive support system. Participant is able to  verbalize types and ability to use techniques and skills needed for reducing stress and depression.  Initial Review & Psychosocial Screening:     Initial Psych Review & Screening - 11/30/15 1154      Initial Review   Current issues with History of Depression     Family Dynamics   Good Support System? No   Concerns Recent loss of significant other;Inappropriate over/under dependence on  family/friends   Comments pt overdependent on adult son.  pt husband passes away one year ago.       Barriers   Psychosocial barriers to participate in program The patient should benefit from training in stress management and relaxation.     Screening Interventions   Interventions Encouraged to exercise      Quality of Life Scores:     Quality of Life - 11/28/15 1230      Quality of Life Scores   Health/Function Pre 22.61 %   Socioeconomic Pre 19.2 %   Psych/Spiritual Pre 21.43 %   Family Pre 22.5 %   GLOBAL Pre 21.75 %      PHQ-9: Recent Review Flowsheet Data    Depression screen [facility] 2/9 11/29/2015 11/28/2015 09/14/2015 09/03/2015 07/05/2015   Decreased Interest 0 0 0 0 0   Down, Depressed, Hopeless 0 0 1 1 0   PHQ - 2 Score 0 0 1 1 0      Psychosocial Evaluation and Intervention:     Psychosocial Evaluation - 09/14/15 1615      Discharge Psychosocial Assessment & Intervention   Discharge Continue support measures as needed   Comments pt encouraged to exercise on her own at home and continue stress management techniques       Psychosocial Re-Evaluation:   Vocational Rehabilitation: Provide vocational rehab assistance to qualifying candidates.   Vocational Rehab Evaluation & Intervention:     Vocational Rehab - 08/28/15 1650      Initial Vocational Rehab Evaluation & Intervention   Assessment shows need for Vocational Rehabilitation No      Education: Education Goals: Education classes will be provided on a weekly basis, covering required topics. Participant will state understanding/return demonstration of topics presented.  Learning Barriers/Preferences:     Learning Barriers/Preferences - 11/22/15 0827      Learning Barriers/Preferences   Learning Barriers Sight   Learning Preferences Skilled Demonstration;Verbal Instruction      Education Topics: Count Your Pulse:  -Group instruction provided by verbal instruction, demonstration, patient  participation and written materials to support subject.  Instructors address importance of being able to find your pulse and how to count your pulse when at home without a heart monitor.  Patients get hands on experience counting their pulse with staff help and individually.   Heart Attack, Angina, and Risk Factor Modification:  -Group instruction provided by verbal instruction, video, and written materials to support subject.  Instructors address signs and symptoms of angina and heart attacks.    Also discuss risk factors for heart disease and how to make changes to improve heart health risk factors. Flowsheet Row CARDIAC REHAB PHASE II EXERCISE from 11/28/2015 in [place]  Date  09/07/15  Instruction Review Code  2- meets goals/outcomes      Functional Fitness:  -Group instruction provided by verbal instruction, demonstration, patient participation, and written materials to support subject.  Instructors address safety measures for doing things around the house.  Discuss how to get up and down off the floor, how to pick  things up properly, how to safely get out of a chair without assistance, and balance training.   Meditation and Mindfulness:  -Group instruction provided by verbal instruction, patient participation, and written materials to support subject.  Instructor addresses importance of mindfulness and meditation practice to help reduce stress and improve awareness.  Instructor also leads participants through a meditation exercise.    Stretching for Flexibility and Mobility:  -Group instruction provided by verbal instruction, patient participation, and written materials to support subject.  Instructors lead participants through series of stretches that are designed to increase flexibility thus improving mobility.  These stretches are additional exercise for major muscle groups that are typically performed during regular warm up and cool down. Flowsheet Row  CARDIAC REHAB PHASE II EXERCISE from 11/28/2015 in [place]  Date  09/05/15  Educator  Luetta Nutting Iline Oven  Instruction Review Code  2- meets goals/outcomes      Hands Only CPR Anytime:  -Group instruction provided by verbal instruction, video, patient participation and written materials to support subject.  Instructors co-teach with AHA video for hands only CPR.  Participants get hands on experience with mannequins.   Nutrition I class: Heart Healthy Eating:  -Group instruction provided by PowerPoint slides, verbal discussion, and written materials to support subject matter. The instructor gives an explanation and review of the Therapeutic Lifestyle Changes diet recommendations, which includes a discussion on lipid goals, dietary fat, sodium, fiber, plant stanol/sterol esters, sugar, and the components of a well-balanced, healthy diet. Flowsheet Row CARDIAC REHAB PHASE II EXERCISE from 11/28/2015 in [place]  Date  09/14/15  Educator  RD  Instruction Review Code  Not applicable [class handout given]      Nutrition II class: Lifestyle Skills:  -Group instruction provided by PowerPoint slides, verbal discussion, and written materials to support subject matter. The instructor gives an explanation and review of label reading, grocery shopping for heart health, heart healthy recipe modifications, and ways to make healthier choices when eating out. Flowsheet Row CARDIAC REHAB PHASE II EXERCISE from 11/28/2015 in [place]  Date  09/14/15  Educator  RD  Instruction Review Code  Not applicable [class handout given]      Diabetes Question & Answer:  -Group instruction provided by PowerPoint slides, verbal discussion, and written materials to support subject matter. The instructor gives an explanation and review of diabetes co-morbidities, pre- and post-prandial blood glucose goals, pre-exercise  blood glucose goals, signs, symptoms, and treatment of hypoglycemia and hyperglycemia, and foot care basics. Flowsheet Row CARDIAC REHAB PHASE II EXERCISE from 11/28/2015 in [place]  Date  09/14/15  Educator  RD  Instruction Review Code  Not applicable [class handout given]      Diabetes Blitz:  -Group instruction provided by PowerPoint slides, verbal discussion, and written materials to support subject matter. The instructor gives an explanation and review of the physiology behind type 1 and type 2 diabetes, diabetes medications and rational behind using different medications, pre- and post-prandial blood glucose recommendations and Hemoglobin A1c goals, diabetes diet, and exercise including blood glucose guidelines for exercising safely.  Flowsheet Row CARDIAC REHAB PHASE II EXERCISE from 11/28/2015 in [place]  Date  09/14/15  Educator  RD  Instruction Review Code  Not applicable [class handout given]      Portion Distortion:  -Group instruction provided by PowerPoint slides, verbal discussion, written materials, and food models  to support subject matter. The instructor gives an explanation of serving size versus portion size, changes in portions sizes over the last 20 years, and what consists of a serving from each food group. Flowsheet Row CARDIAC REHAB PHASE II EXERCISE from 11/28/2015 in [place]  Date  09/12/15  Educator  RD  Instruction Review Code  2- meets goals/outcomes      Stress Management:  -Group instruction provided by verbal instruction, video, and written materials to support subject matter.  Instructors review role of stress in heart disease and how to cope with stress positively.     Exercising on Your Own:  -Group instruction provided by verbal instruction, power point, and written materials to support subject.  Instructors discuss benefits of exercise, components  of exercise, frequency and intensity of exercise, and end points for exercise.  Also discuss use of nitroglycerin and activating EMS.  Review options of places to exercise outside of rehab.  Review guidelines for sex with heart disease.   Cardiac Drugs I:  -Group instruction provided by verbal instruction and written materials to support subject.  Instructor reviews cardiac drug classes: antiplatelets, anticoagulants, beta blockers, and statins.  Instructor discusses reasons, side effects, and lifestyle considerations for each drug class.   Cardiac Drugs II:  -Group instruction provided by verbal instruction and written materials to support subject.  Instructor reviews cardiac drug classes: angiotensin converting enzyme inhibitors (ACE-I), angiotensin II receptor blockers (ARBs), nitrates, and calcium channel blockers.  Instructor discusses reasons, side effects, and lifestyle considerations for each drug class. Flowsheet Row CARDIAC REHAB PHASE II EXERCISE from 11/28/2015 in [place]  Date  11/28/15  Educator  pharmacy  Instruction Review Code  2- meets goals/outcomes      Anatomy and Physiology of the Circulatory System:  -Group instruction provided by verbal instruction, video, and written materials to support subject.  Reviews functional anatomy of heart, how it relates to various diagnoses, and what role the heart plays in the overall system.   Knowledge Questionnaire Score:     Knowledge Questionnaire Score - 11/23/15 1359      Knowledge Questionnaire Score   Pre Score     DM 13/15      Core Components/Risk Factors/Patient Goals at Admission:     Personal Goals and Risk Factors at Admission - 11/22/15 0828      Core Components/Risk Factors/Patient Goals on Admission    Weight Management Obesity;Weight Loss;Yes   Intervention Weight Management: Develop a combined nutrition and exercise program designed to reach desired caloric intake, while  maintaining appropriate intake of nutrient and fiber, sodium and fats, and appropriate energy expenditure required for the weight goal.;Weight Management: Provide education and appropriate resources to help participant work on and attain dietary goals.;Weight Management/Obesity: Establish reasonable short term and long term weight goals.;Obesity: Provide education and appropriate resources to help participant work on and attain dietary goals.   Expected Outcomes Long Term: Adherence to nutrition and physical activity/exercise program aimed toward attainment of established weight goal;Short Term: Continue to assess and modify interventions until short term weight is achieved;Weight Maintenance: Understanding of the daily nutrition guidelines, which includes 25-35% calories from fat, 7% or less cal from saturated fats, less than 200mg  cholesterol, less than 1.5gm of sodium, & 5 or more servings of fruits and vegetables daily;Weight Loss: Understanding of general recommendations for a balanced deficit meal plan, which promotes 1-2 lb weight loss per week and includes a negative energy balance  of [phone] kcal/d;Understanding recommendations for meals to include 15-35% energy as protein, 25-35% energy from fat, 35-60% energy from carbohydrates, less than 200mg  of dietary cholesterol, 20-35 gm of total fiber daily;Understanding of distribution of calorie intake throughout the day with the consumption of 4-5 meals/snacks   Increase Strength and Stamina Yes   Intervention Provide advice, education, support and counseling about physical activity/exercise needs.;Develop an individualized exercise prescription for aerobic and resistive training based on initial evaluation findings, risk stratification, comorbidities and participant's personal goals.   Expected Outcomes Achievement of increased cardiorespiratory fitness and enhanced flexibility, muscular endurance and strength shown through measurements of functional  capacity and personal statement of participant.   Improve shortness of breath with ADL's Yes   Intervention Provide education, individualized exercise plan and daily activity instruction to help decrease symptoms of SOB with activities of daily living.   Expected Outcomes Short Term: Achieves a reduction of symptoms when performing activities of daily living.   Diabetes Yes   Intervention Provide education about signs/symptoms and action to take for hypo/hyperglycemia.;Provide education about proper nutrition, including hydration, and aerobic/resistive exercise prescription along with prescribed medications to achieve blood glucose in normal ranges: Fasting glucose 65-99 mg/dL   Expected Outcomes Short Term: Participant verbalizes understanding of the signs/symptoms and immediate care of hyper/hypoglycemia, proper foot care and importance of medication, aerobic/resistive exercise and nutrition plan for blood glucose control.;Long Term: Attainment of HbA1C < 7%.   Hypertension Yes   Intervention Provide education on lifestyle modifcations including regular physical activity/exercise, weight management, moderate sodium restriction and increased consumption of fresh fruit, vegetables, and low fat dairy, alcohol moderation, and smoking cessation.;Monitor prescription use compliance.   Expected Outcomes Short Term: Continued assessment and intervention until BP is < 140/74mm HG in hypertensive participants. < 130/36mm HG in hypertensive participants with diabetes, heart failure or chronic kidney disease.;Long Term: Maintenance of blood pressure at goal levels.   Lipids Yes   Intervention Provide education and support for participant on nutrition & aerobic/resistive exercise along with prescribed medications to achieve LDL 70mg , HDL >40mg .   Expected Outcomes Short Term: Participant states understanding of desired cholesterol values and is compliant with medications prescribed. Participant is following  exercise prescription and nutrition guidelines.;Long Term: Cholesterol controlled with medications as prescribed, with individualized exercise RX and with personalized nutrition plan. Value goals: LDL < 70mg , HDL > 40 mg.   Stress Yes   Intervention Offer individual and/or small group education and counseling on adjustment to heart disease, stress management and health-related lifestyle change. Teach and support self-help strategies.;Refer participants experiencing significant psychosocial distress to appropriate mental health specialists for further evaluation and treatment. When possible, include family members and significant others in education/counseling sessions.   Expected Outcomes Short Term: Participant demonstrates changes in health-related behavior, relaxation and other stress management skills, ability to obtain effective social support, and compliance with psychotropic medications if prescribed.;Long Term: Emotional wellbeing is indicated by absence of clinically significant psychosocial distress or social isolation.   Personal Goal Other Yes   Personal Goal To "get some me time" and focus on self. Gain independence and get life back   Intervention Provide stress management classes and coping mechanisms to assist with improving self assurance and self confidence   Expected Outcomes Pt will be able to live independently, get life back and focus on self      Core Components/Risk Factors/Patient Goals Review:    Core Components/Risk Factors/Patient Goals at Discharge (Final Review):    ITP Comments:  ITP Comments    Row Name 08/28/15 1642 11/22/15 0819         ITP Comments Dr. Fransico Him, MD Medical Director  Dr. Fransico Him, MD Medical Director          Comments: Pt is making expected progress toward personal goals after completing 2sessions.  Pt has returned to complete program.  Pt has better outlook and coping skills.  Pt reports she lives with her son and his family  including young children. There is little food in the home, especially heart healthy, diabetic  Items.   Recommend continued exercise and life style modification education including  stress management and relaxation techniques to decrease cardiac risk profile.

## 2015-11-30 NOTE — Patient Outreach (Signed)
Assigned to [employer].

## 2015-11-30 NOTE — Patient Outreach (Signed)
Assigned to Pharmacy.

## 2015-11-30 NOTE — Patient Outreach (Signed)
[facility]) Care Management  11/30/2015  [name] [name] [birth year] [id number]   REFERRAL SOURCE: [facility] REFERRAL REASON: 2 myocardial infarctions within the past 3 months. Previously active with [facility] care management and could benefit from follow up related to diet,exerecise, and overall health concerns due to haviing 2 myocardial infarctions so close together.  CONSENT;  Self/ patient  PROVIDERS:  Dr. Leeroy Cha, primary MD Dr. [name]  Cardiology Patient currently in cardiac rehab at [place]:  Patient lives with family. Transportation: Patient takes city bus  SUBJECTIVE: Telephone call to patient regarding [facility] referral.  HIPAA verified with patient.  Discussed and offered [facility] care management services to patient.  Patient states she is familiar with [facility] care management services because she has had them before. Patient verbally agreed to [facility] care management services.  Patient states she had open heart surgery in April 2017 and then had a heart attack in July 2017.  Patient states she just started cardiac rehab 11/28/15.  Patient states she is also in school.   Patient states she has a follow up appointment with her cardiologist in December 2017.  Patient states she is has follow up visits with her cardiologist every 3 months.  Patient reports she has diabetes.  Patient states her current A1c is 7.6 which is coming down. Patient states she has recently meet with a diabetic educator and she has implemented a plan for her to follow. Patient states she has started a food journal and is recording her emotional states as well.  Patient states she has a follow up visit with the diabetic education on December 27, 2015. Patient states her primary MD has referred her to an endocrinologist.  Patient states she has not had her first visit yet.  Patient states she does not feel she needs nursing follow up with [facility] care management at this time.  Patient states she feels she has sufficient follow up with her doctors, cardiac rehab, and the doctors and programs her primary has referred her to.  Patient states she has a very busy schedule.   Patient states her main concern is being able to afford groceries and pay her bills. Patient states this has become very stressful for her. Patient states has attempted in the past to get food stamps but was turned down. Patient states she receives a disability check monthly. Patient states she has heard about and looked into the [employer] program for food but is concerned it may not be worth the money if she is given food that she is not able to have due to her health problems.  Patient reports she is able to pay her copays to the doctor but is not able to pay her outstanding medical bills.   Patient states she needs assistance with transportation and would like to work on her advance directive. Patient states she is currently on approximately 17 medications. Patient states she is able to afford her medications at this time  Due to medication assistance program.   Patient verbally agreed to follow up with social worker. Patient verbally agreed to follow up with pharmacy to review her medications.  Patient verbally agreed to next outreach from [facility] care management.  ASSESSMENT: [facility] referral.  Patient would benefit from social work and pharmacy referral. Patient reports her main concerns/ needs are for community resources.  Due to polypharmacy referral to pharmacy will be of benefit for medication review. Per patients medical record,history of tobacco use.  Will  request [facility] care management pharmacist to discuss cessation program if still in use.    PLAN; RNCM will refer patient to Education officer, museum and pharmacy.    Quinn Plowman RN,BSN,CCM [facility] Telephonic  [phone]

## 2015-12-03 ENCOUNTER — Other Ambulatory Visit: Payer: Self-pay | Admitting: *Deleted

## 2015-12-03 ENCOUNTER — Encounter: Payer: Self-pay | Admitting: *Deleted

## 2015-12-03 ENCOUNTER — Encounter (HOSPITAL_COMMUNITY): Payer: Commercial Managed Care - HMO

## 2015-12-03 NOTE — Patient Outreach (Signed)
[name] [name]) Care Management  12/03/2015  [name] [name] 14-Jul-1960 [id number]    CSW received a new referral on patient from [name] [name], Telephonic Nurse Case Manager with [place] [facility], indicating that patient would benefit from social work services and resources to assist with referrals to various community agencies.  Mrs. [name] [name] reported that patient was referred to [place] through the [facility].  Patient had open heart surgery in April of 2017 and heart attack in July of 2017.  Mrs. [name] [name] reported that patient is refusing RNCM services, but may be interested in receiving CSW services.   Patient admitted to the representative with the [facility] that she is interested in obtaining food resources, such as [employer] through [employer] of [place], Physicist, medical through the [place] and information on the [employer], The [place] in [place] and the [place] in [place].  Apparently, patient is also in need of financial assistance, transportation assistance and assistance with completion of Advanced Directives ([place] documents). CSW was able to make initial contact with patient today to try and offer social work services and resources, as well as complete the initial phone assessment; however, patient denied needing social work services through [place] at this time.  CSW explained to patient that CSW will be mailing patient a packet of resource information which will include all of the following: Mobile Meals through [employer] of [facility] for [employer] through the [place] on the [employer] on The [place] in [place] on [address] in [place] and Public relations account executive on [employer] through [employer] of [place] on Wellsite geologist through [employer] on [place] for Tax adviser ([place] documents) [place] provided patient with CSW's contact information, encouraging patient to contact CSW in the near future if patient changes her mind and wants to receive social work services through [place] with [place] [facility].  Patient voiced understanding and was agreeable to this plan. [name] [name], BSW, MSW, LCSW  Licensed Education officer, environmental Health System  Mailing [place] [address] [place], [place] [address] Physical Address-[address], [place], [place] [address] Toll Free Main # [phone] Fax # [phone] Cell # [phone]  Fax # [phone]  [email or web address]

## 2015-12-05 ENCOUNTER — Telehealth (HOSPITAL_COMMUNITY): Payer: Self-pay | Admitting: Cardiac Rehabilitation

## 2015-12-05 ENCOUNTER — Encounter (HOSPITAL_COMMUNITY)
Admission: RE | Admit: 2015-12-05 | Discharge: 2015-12-05 | Disposition: A | Discharge status: Home / Self Care | Payer: Commercial Managed Care - HMO | Source: Ambulatory Visit | Attending: Cardiology | Admitting: Cardiology

## 2015-12-05 DIAGNOSIS — Z951 Presence of aortocoronary bypass graft: Secondary | ICD-10-CM

## 2015-12-05 DIAGNOSIS — I2111 ST elevation (STEMI) myocardial infarction involving right coronary artery: Secondary | ICD-10-CM

## 2015-12-05 NOTE — Telephone Encounter (Signed)
-----   Message from Francis Gaines,  sent at 12/05/2015 12:02 PM EDT ----- Regarding: RE: cardiac rehab  Thank you.  I have actually provided patient with a List of Psychologist, sport and exercise.  In addition, patient has been encouraged to complete [place] Applications with the [place], which has also been provided to patient.  Thanks, Nat Christen, BSW, MSW, [employer]  Licensed Clinical Social Worker  [place]  Mailing [place] [address] [place], [place] [address] Physical Address-[address], [place], [place] [address] Toll Free Main # [phone] Fax # [phone] Cell # [phone]  Fax # [phone]  [email or web address]       ----- Message ----- From: Lowell Guitar, RN Sent: 12/05/2015  11:16 AM To: Francis Gaines, LCSW Subject: cardiac rehab                                  Dear Di Kindle,  Pt is enrolled in cardiac rehab.  She was unable to exercise today due to stress and anxiety related to financial worries and emotional distress. Pt exhibits inappropriate coping skills.  I have recommended professional counseling for pt however she declined because she thought you would help arrange this for her.  I have called behavioral health on her behalf and set up counseling.  However she is interested in talking with you for financial  assistance.  I asked her to contact you today to discuss arranging your services.    Thank you, [name], RN, BSN Cardiac Pulmonary Rehab

## 2015-12-07 ENCOUNTER — Encounter (HOSPITAL_COMMUNITY)
Admission: RE | Admit: 2015-12-07 | Discharge: 2015-12-07 | Disposition: A | Discharge status: Home / Self Care | Payer: Commercial Managed Care - HMO | Source: Ambulatory Visit | Attending: Cardiology | Admitting: Cardiology

## 2015-12-07 ENCOUNTER — Telehealth: Payer: Self-pay | Admitting: Cardiology

## 2015-12-07 NOTE — Telephone Encounter (Signed)
Follow Up ° ° ° ° °Pt is returning call from earlier. Please call. °

## 2015-12-07 NOTE — Telephone Encounter (Signed)
Returned call to patient.She stated she needed a letter from [place] saying she needs air conditioning in her home due to her heart condition.Stated if she does not have letter by [place] 12/10/15 her power will be disconnected.Stated she wants to pick up letter at [facility] office.Spoke to Derek Mound RN in triage she will leave letter at front desk for pick up.

## 2015-12-07 NOTE — Telephone Encounter (Signed)
Please call,she needs a letter for Social Service for her light bill. She will give you the details
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
## 2015-12-07 NOTE — Telephone Encounter (Signed)
Returned call to patient no answer.LMTC. 

## 2015-12-07 NOTE — Progress Notes (Signed)
Pt arrived at cardiac rehab reporting is she is unable to exercise due to working with community agencies to get the resources she needs to pay her electric bill. Message sent to [facility] CSW to contact pt to given information.  Also counseling appt scheduled with Legrand Pitts at [facility] on 12/24/15 @ 10:00.  Pt plans to return to exercise on next regulary scheduled appt. Pt states she is in better spirits today.  Will continue to monitor.

## 2015-12-10 ENCOUNTER — Encounter (HOSPITAL_COMMUNITY)
Admission: RE | Admit: 2015-12-10 | Discharge: 2015-12-10 | Disposition: A | Discharge status: Home / Self Care | Payer: Commercial Managed Care - HMO | Source: Ambulatory Visit | Attending: Cardiology | Admitting: Cardiology

## 2015-12-10 ENCOUNTER — Other Ambulatory Visit: Payer: Self-pay | Admitting: *Deleted
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
## 2015-12-12 ENCOUNTER — Encounter (HOSPITAL_COMMUNITY)
Admission: RE | Admit: 2015-12-12 | Discharge: 2015-12-12 | Disposition: A | Discharge status: Home / Self Care | Payer: Commercial Managed Care - HMO | Source: Ambulatory Visit | Attending: Cardiology | Admitting: Cardiology

## 2015-12-12 ENCOUNTER — Other Ambulatory Visit: Payer: Self-pay | Admitting: *Deleted

## 2015-12-12 DIAGNOSIS — I2111 ST elevation (STEMI) myocardial infarction involving right coronary artery: Secondary | ICD-10-CM

## 2015-12-12 DIAGNOSIS — Z79899 Other long term (current) drug therapy: Secondary | ICD-10-CM | POA: Diagnosis not present

## 2015-12-12 DIAGNOSIS — Z951 Presence of aortocoronary bypass graft: Secondary | ICD-10-CM

## 2015-12-12 DIAGNOSIS — I214 Non-ST elevation (NSTEMI) myocardial infarction: Secondary | ICD-10-CM | POA: Diagnosis not present

## 2015-12-12 LAB — GLUCOSE, CAPILLARY: Glucose-Capillary: 129 mg/dL — ABNORMAL HIGH (ref 65–99)

## 2015-12-12 NOTE — Progress Notes (Signed)
Pt c/o sharp fleeting right sided chest pain while exercising at cardiac rehab today. Pain resolved spontaneously. Pt report similar symptoms in abdomen yesterday which she describes as "gas".  In fact, pt reports she has had frequent inconsistent stools over past few months which she attributes to medication side effect. Pt advised to discuss symptoms with her PCP. Pt states she contacted PCP yesterday and is awaiting reply.  Pt also reports she has not had communication with [facility] CSW.  LM for CSW to contact pt and left cell phone number. Pt also used her cell phone to call CSW.  Pt given information about [place] support group, [facility] Support group and appt card for [employer] counseling appt.  Pt verbalized understanding.

## 2015-12-12 NOTE — Patient Outreach (Signed)
[facility]) Care Management  12/12/2015  [name] [name] [birth year] [id number]   SW received a voicemail message from patient today, requesting that CSW contact her at CSW's earliest convenience.  CSW returned patient's call, but patient was unavailable.  A HIPAA complaint message was left for patient on voicemail.  CSW is currently awaiting a return call. Nat Christen, BSW, MSW, LCSW  Licensed Education officer, environmental Health System  Mailing [place] [address] [place], [place] [address] Physical Address-[address], [place],  [address] Toll Free Main # [phone] Fax # [phone] Cell # [phone]  Fax # [phone]  [email or web address]

## 2015-12-14 ENCOUNTER — Encounter (HOSPITAL_COMMUNITY): Payer: Commercial Managed Care - HMO

## 2015-12-17 ENCOUNTER — Other Ambulatory Visit: Payer: Self-pay | Admitting: *Deleted

## 2015-12-17 ENCOUNTER — Other Ambulatory Visit: Payer: Self-pay | Admitting: Cardiology

## 2015-12-17 ENCOUNTER — Encounter (HOSPITAL_COMMUNITY)
Admission: RE | Admit: 2015-12-17 | Discharge: 2015-12-17 | Disposition: A | Discharge status: Home / Self Care | Payer: Commercial Managed Care - HMO | Source: Ambulatory Visit | Attending: Cardiology | Admitting: Cardiology

## 2015-12-17 DIAGNOSIS — Z951 Presence of aortocoronary bypass graft: Secondary | ICD-10-CM | POA: Diagnosis not present

## 2015-12-17 DIAGNOSIS — I214 Non-ST elevation (NSTEMI) myocardial infarction: Secondary | ICD-10-CM | POA: Insufficient documentation

## 2015-12-17 DIAGNOSIS — Z79899 Other long term (current) drug therapy: Secondary | ICD-10-CM | POA: Diagnosis not present

## 2015-12-17 DIAGNOSIS — I2111 ST elevation (STEMI) myocardial infarction involving right coronary artery: Secondary | ICD-10-CM

## 2015-12-17 LAB — GLUCOSE, CAPILLARY
Glucose-Capillary: 100 mg/dL — ABNORMAL HIGH (ref 65–99)
Glucose-Capillary: 152 mg/dL — ABNORMAL HIGH (ref 65–99)

## 2015-12-17 MED ORDER — TICAGRELOR 90 MG PO TABS: 90.0000 mg | ORAL_TABLET | Freq: Two times a day (BID) | ORAL | 1 refills | Status: DC

## 2015-12-17 NOTE — Patient Outreach (Signed)
[facility]) Care Management  12/17/2015  [name] [name] Jul 30, 1960 [id number]   CSW continues to try and make contact with patient to ensure that patient received the packet of resource information that CSW mailed to patient's home, as well as answer any questions she may have; however, patient has been unavailable.  CSW will make another outreach attempt in one week, if CSW does not receive a return call from patient in the meantime. Nat Christen, BSW, MSW, LCSW  Licensed Education officer, environmental Health System  Mailing [place] [address] [place], [place] [address] Physical Address-[address] [place], [place], [place] [address] Toll Free Main # [phone] Fax # [phone] Cell # [phone]  Fax # [phone]  [email or web address]

## 2015-12-17 NOTE — Progress Notes (Signed)
[name] [name] 55 y.o. female Nutrition Note Spoke with pt. Nutrition Plan and Nutrition Survey goals reviewed with pt. Pt is following Step 2 of the Therapeutic Lifestyle Changes diet. Pt c/o decreased appetite. Yet, pt wt is up 1.8 kg. Pt wants to lose wt. Pt states she is overwhelmed with her problems at this time. Pt is diabetic. Last A1c indicates blood glucose poorly controlled. This Probation officer went over Diabetes Education test results. Pt checks CBG's once daily. Fasting CBG's reportedly 117-125 mg/dL. Pt reports financial difficulty buying food. Pt expressed understanding of the information reviewed. Pt aware of nutrition education classes offered. Pt declined to attend nutrition classes and nutrition class handouts.  Lab Results  Component Value Date   HGBA1C 8.9 (H) 06/12/2015   Wt Readings from Last 3 Encounters:  11/27/15 206 lb (93.4 kg)  11/22/15 209 lb 3.5 oz (94.9 kg)  10/25/15 205 lb 9.6 oz (93.3 kg)    Nutrition Diagnosis ? Food-and nutrition-related knowledge deficit related to lack of exposure to information as related to diagnosis of: ? CVD ? DM  ? Obesity related to excessive energy intake as evidenced by a BMI of 32.8  Nutrition Intervention ? Pt's individual nutrition plan reviewed with pt. ? Benefits of adopting Therapeutic Lifestyle Changes discussed when Medficts reviewed. ? Handout given for: Resources for Patients with Difficulty Buying Food ? Pt to attend the Portion Distortion class ? Pt to attend the Diabetes Q & A class ? Pt to attend the   ? Nutrition I class                   ? Nutrition II class     ? Diabetes Blitz class    ? Continue client-centered nutrition education by RD, as part of interdisciplinary care. Goal(s) ? Pt to identify food quantities necessary to achieve weight loss of 6-24 lb (2.7-10.9 kg) at graduation from cardiac rehab.  ? CBG concentrations in the normal range or as close to normal as is safely possible. Monitor and Evaluate  progress toward nutrition goal with team. Derek Mound, M.Ed, RD, LDN, CDE 12/17/2015 9:11 AM

## 2015-12-17 NOTE — Telephone Encounter (Signed)
Request sent to pharmacy

## 2015-12-19 ENCOUNTER — Encounter (HOSPITAL_COMMUNITY)
Admission: RE | Admit: 2015-12-19 | Discharge: 2015-12-19 | Disposition: A | Discharge status: Home / Self Care | Payer: Commercial Managed Care - HMO | Source: Ambulatory Visit | Attending: Cardiology | Admitting: Cardiology

## 2015-12-19 DIAGNOSIS — Z79899 Other long term (current) drug therapy: Secondary | ICD-10-CM | POA: Diagnosis not present

## 2015-12-19 DIAGNOSIS — Z951 Presence of aortocoronary bypass graft: Secondary | ICD-10-CM

## 2015-12-19 DIAGNOSIS — I2111 ST elevation (STEMI) myocardial infarction involving right coronary artery: Secondary | ICD-10-CM

## 2015-12-19 DIAGNOSIS — I214 Non-ST elevation (NSTEMI) myocardial infarction: Secondary | ICD-10-CM | POA: Diagnosis not present

## 2015-12-19 LAB — GLUCOSE, CAPILLARY: Glucose-Capillary: 128 mg/dL — ABNORMAL HIGH (ref 65–99)

## 2015-12-19 NOTE — Progress Notes (Signed)
Reviewed home exercise with pt today.  Pt plans to walk 10-15 min. bouts 2-3x/day; 3-4x/week in addition to coming to cardiac rehab. Reviewed THR, pulse, RPE, sign and symptoms, NTG use, and when to call 911 or MD.  Also discussed weather considerations and indoor options.  Pt voiced understanding.   Gillie Fleites [employer]

## 2015-12-21 ENCOUNTER — Encounter (HOSPITAL_COMMUNITY)
Admission: RE | Admit: 2015-12-21 | Discharge: 2015-12-21 | Disposition: A | Discharge status: Home / Self Care | Payer: Commercial Managed Care - HMO | Source: Ambulatory Visit | Attending: Cardiology | Admitting: Cardiology

## 2015-12-21 DIAGNOSIS — Z951 Presence of aortocoronary bypass graft: Secondary | ICD-10-CM | POA: Diagnosis not present

## 2015-12-21 DIAGNOSIS — I214 Non-ST elevation (NSTEMI) myocardial infarction: Secondary | ICD-10-CM | POA: Diagnosis not present

## 2015-12-21 DIAGNOSIS — I2111 ST elevation (STEMI) myocardial infarction involving right coronary artery: Secondary | ICD-10-CM

## 2015-12-21 DIAGNOSIS — Z79899 Other long term (current) drug therapy: Secondary | ICD-10-CM | POA: Diagnosis not present

## 2015-12-21 LAB — GLUCOSE, CAPILLARY: Glucose-Capillary: 141 mg/dL — ABNORMAL HIGH (ref 65–99)

## 2015-12-24 ENCOUNTER — Encounter (HOSPITAL_COMMUNITY): Payer: Commercial Managed Care - HMO

## 2015-12-24 ENCOUNTER — Ambulatory Visit (INDEPENDENT_AMBULATORY_CARE_PROVIDER_SITE_OTHER): Payer: Commercial Managed Care - HMO | Admitting: Psychology

## 2015-12-24 ENCOUNTER — Encounter: Payer: Self-pay | Admitting: *Deleted

## 2015-12-24 ENCOUNTER — Other Ambulatory Visit: Payer: Self-pay | Admitting: *Deleted

## 2015-12-24 DIAGNOSIS — F4320 Adjustment disorder, unspecified: Secondary | ICD-10-CM

## 2015-12-24 NOTE — Patient Outreach (Signed)
[facility]) Care Management  12/24/2015  [name] [name] May 22, 1960 [id number]   CSW made a third and final attempt to try and contact patient today to perform phone assessment, as well as assess and assist with social work needs and services, without success.  A HIPAA compliant message was left for patient on voicemail.  CSW  continues to await a return call.  CSW will mail an outreach letter to patient's home, encouraging patient to contact CSW at their earliest convenience, if patient is interested in receiving social work services through [place] with [facility].  If CSW does not receive a return call from patient within the next 10 business days, CSW will proceed with case closure.  Required number of phone attempts will have been made and outreach letter mailed.   Nat Christen, BSW, MSW, LCSW  Licensed Education officer, environmental Health System  Mailing [place] [address] [place], [place] [address] Physical Address-[address], [place], [place] [address] Toll Free Main # [phone] Fax # [phone] Cell # [phone]  Fax # [phone]  [email or web address]

## 2015-12-26 ENCOUNTER — Encounter (HOSPITAL_COMMUNITY)
Admission: RE | Admit: 2015-12-26 | Discharge: 2015-12-26 | Disposition: A | Discharge status: Home / Self Care | Payer: Commercial Managed Care - HMO | Source: Ambulatory Visit | Attending: Cardiology | Admitting: Cardiology

## 2015-12-26 ENCOUNTER — Encounter (HOSPITAL_COMMUNITY): Payer: Self-pay | Admitting: Psychology

## 2015-12-26 NOTE — Progress Notes (Signed)
Cardiac Individual Treatment Plan  Patient Details  Name: [name] [name] MRN: [id number] Date of Birth: May 21, 1960 Referring Provider:   Flowsheet Row CARDIAC REHAB PHASE II ORIENTATION from 11/22/2015 in [place]  Referring Provider  Martinique, Peter MD      Initial Encounter Date:  [place] PHASE II ORIENTATION from 11/22/2015 in [place]  Date  11/22/15  Referring Provider  Martinique, Peter MD      Visit Diagnosis: No diagnosis found.  Patient's Home Medications on Admission:  Current Outpatient Prescriptions:  .  acetaminophen (TYLENOL) 325 MG tablet, Take 2 tablets (650 mg total) by mouth every 4 (four) hours as needed for headache or mild pain., Disp: , Rfl:  .  aspirin EC 81 MG EC tablet, Take 1 tablet (81 mg total) by mouth daily., Disp: , Rfl:  .  atorvastatin (LIPITOR) 80 MG tablet, TAKE 1 TABLET BY MOUTH DAILY AT 6PM, Disp: 90 tablet, Rfl: 3 .  Biotin 1 MG CAPS, Take 2 capsules by mouth., Disp: , Rfl:  .  Cholecalciferol (VITAMIN D3) 50000 units CAPS, Take 50,000 Units by mouth once a week., Disp: , Rfl:  .  INVOKANA 300 MG TABS tablet, Take 300 mg by mouth daily., Disp: , Rfl:  .  Iron-Vitamins (GERITOL COMPLETE PO), Take 1 tablet by mouth daily., Disp: , Rfl:  .  isosorbide mononitrate (IMDUR) 30 MG 24 hr tablet, Take 0.5 tablets (15 mg total) by mouth daily., Disp: 45 tablet, Rfl: 3 .  JANUVIA 100 MG tablet, Take 100 mg by mouth daily., Disp: , Rfl:  .  metFORMIN (GLUCOPHAGE) 1000 MG tablet, Take 1,000 mg by mouth 2 (two) times daily., Disp: , Rfl:  .  Metoprolol Tartrate 37.5 MG TABS, Take 37.5 mg by mouth 2 (two) times daily., Disp: 180 tablet, Rfl: 3 .  nitroGLYCERIN (NITROSTAT) 0.4 MG SL tablet, Place 1 tablet (0.4 mg total) under the tongue every 5 (five) minutes as needed for chest pain. MAX 3 doses, Disp: 25 tablet, Rfl: 1 .  ticagrelor (BRILINTA) 90 MG TABS tablet, Take 1 tablet (90  mg total) by mouth 2 (two) times daily., Disp: 180 tablet, Rfl: 1 .  traMADol (ULTRAM) 50 MG tablet, Take 1 tablet (50 mg total) by mouth every 6 (six) hours as needed., Disp: 30 tablet, Rfl: 0  Past Medical History: Past Medical History:  Diagnosis Date  . Acute ST elevation myocardial infarction (STEMI) involving right coronary artery ([place]) 10/04/2015  . Anxiety   . Anxiety and depression   . CAD (coronary artery disease)    a. CABG 06/2015: LIMA-LAD, SVG-RCA, SVG-OM2 b. STEMI s/p PCI to distal RCA lesion with a small Promus Premier stent. ( patent LIMA to the LAD, occluded SVG to OM, occluded SVG to RCA.)  . CAD (coronary artery disease) of artery bypass graft; occluded SVG to RCA and occluded SVG to OM 10/08/2015  . Empty sella ([place])   . Hidradenitis axillaris   . Hyperlipemia   . Hypertension   . Kidney stones   . Lupus dx'd 2008   "they think I have this; have to get retested" (06/12/2015)  . Obesity (BMI 30.0-34.9)   . Ovarian cyst   . Pneumonia X 2  . PONV (postoperative nausea and vomiting)   . Rheumatoid aortitis   . Rheumatoid arthritis ([place])   . S/P angioplasty with stent; 10/04/15 to distal RCA lesion. with Promus premier. 10/08/2015  . Scoliosis   .  Stroke [facility]) March 2014   left MCA branches; "they say I have them all the time", denies residual on 06/12/2015  . Tobacco abuse   . Type II diabetes mellitus (HCC)     Tobacco Use: History  Smoking Status  . Former Smoker  . Packs/day: 0.50  . Years: 36.00  . Types: Cigarettes  . Quit date: 06/12/2014  Smokeless Tobacco  . Never Used    Labs: Recent Review Flowsheet Data    Labs for ITP Cardiac and Pulmonary Rehab Latest Ref Rng & Units 06/19/2015 06/20/2015 06/20/2015 10/04/2015 10/05/2015   Cholestrol 0 - 200 mg/dL - - - - 160   LDLCALC 0 - 99 mg/dL - - - - 76   HDL >40 mg/dL - - - - 56   Trlycerides <150 mg/dL - - - - 142   Hemoglobin A1c 4.8 - 5.6 % - - - - -   PHART 7.350 - 7.450 - 7.374 - - -   PCO2ART 35.0 -  45.0 mmHg - 44.0 - - -   HCO3 20.0 - 24.0 mEq/L - 25.4(H) - - -   TCO2 0 - 100 mmol/L '22 27 25 24 '$ -   ACIDBASEDEF 0.0 - 2.0 mmol/L - - - - -   O2SAT % - 92.0 - - -      Capillary Blood Glucose: Lab Results  Component Value Date   GLUCAP 141 (H) 12/21/2015   GLUCAP 128 (H) 12/19/2015   GLUCAP 152 (H) 12/17/2015   GLUCAP 100 (H) 12/17/2015   GLUCAP 129 (H) 12/12/2015     Exercise Target Goals:    Exercise Program Goal: Individual exercise prescription set with THRR, safety & activity barriers. Participant demonstrates ability to understand and report RPE using BORG scale, to self-measure pulse accurately, and to acknowledge the importance of the exercise prescription.  Exercise Prescription Goal: Starting with aerobic activity 30 plus minutes a day, 3 days per week for initial exercise prescription. Provide home exercise prescription and guidelines that participant acknowledges understanding prior to discharge.  Activity Barriers & Risk Stratification:     Activity Barriers & Cardiac Risk Stratification - 11/22/15 0822      Activity Barriers & Cardiac Risk Stratification   Activity Barriers Back Problems;Other (comment)   Comments R upper arm pain    Cardiac Risk Stratification High      6 Minute Walk:     6 Minute Walk    Row Name 08/28/15 1705 11/22/15 1228       6 Minute Walk   Phase Initial Initial    Distance 1610 feet 1328 feet    Walk Time 6 minutes 6 minutes    # of Rest Breaks 0 0    MPH 3.05 2.52    METS 4.13 3.3    RPE 11 11    VO2 Peak 14.45 11.56    Symptoms Yes (comment) No    Comments Bilateral ankle soreness secondary to RA and bone spurs.  -    Resting HR 91 bpm 67 bpm    Resting BP 138/88 124/88    Max Ex. HR 114 bpm 94 bpm    Max Ex. BP 144/90 132/90    2 Minute Post BP 132/90 134/80       Initial Exercise Prescription:     Initial Exercise Prescription - 11/22/15 1200      Date of Initial Exercise RX and Referring Provider    Date 11/22/15   Referring Provider Martinique, Peter MD  Bike   Level --   Minutes --   METs --     Recumbant Bike   Level 2   Minutes 10   METs 2     NuStep   Level 3   Minutes 10   METs 2     Track   Laps 10   Minutes 10   METs 2.74     Prescription Details   Frequency (times per week) 3   Duration Progress to 30 minutes of continuous aerobic without signs/symptoms of physical distress     Intensity   THRR 40-80% of Max Heartrate 65-131   Ratings of Perceived Exertion 11-13   Perceived Dyspnea 0-4     Progression   Progression Continue to progress workloads to maintain intensity without signs/symptoms of physical distress.     Resistance Training   Training Prescription Yes   Weight 2 lbs   Reps 10-12      Perform Capillary Blood Glucose checks as needed.  Exercise Prescription Changes:      Exercise Prescription Changes    Row Name 09/17/15 1500 11/29/15 1100 12/19/15 1600         Exercise Review   Progression Yes Yes  -       Response to Exercise   Blood Pressure (Admit) 132/80 128/90 128/84     Blood Pressure (Exercise) 128/90 142/80 122/80     Blood Pressure (Exit) 120/60 138/84 116/74     Heart Rate (Admit)  - 82 bpm 78 bpm     Heart Rate (Exercise) 99 bpm 103 bpm 95 bpm     Heart Rate (Exit) 136 bpm 83 bpm 74 bpm     Oxygen Saturation (Admit) 99 %  -  -     Rating of Perceived Exertion (Exercise) '11 11 11     '$ Comments  -  - Reviewed on 12/19/15     Duration Progress to 30 minutes of continuous aerobic without signs/symptoms of physical distress Progress to 30 minutes of continuous aerobic without signs/symptoms of physical distress Progress to 30 minutes of continuous aerobic without signs/symptoms of physical distress     Intensity THRR unchanged THRR unchanged THRR unchanged       Progression   Progression Continue to progress workloads to maintain intensity without signs/symptoms of physical distress. Continue to progress workloads to  maintain intensity without signs/symptoms of physical distress. Continue to progress workloads to maintain intensity without signs/symptoms of physical distress.     Average METs 2.5 2.6 2.2       Resistance Training   Training Prescription Yes Yes Yes     Weight 3LBS 2 lbs 2 lbs     Reps 10-12 10-12 10-12       Treadmill   MPH -  -  -     Grade -  -  -     Minutes -  -  -     METs -  -  -       Bike   Level 1.2  -  -     Minutes 10  -  -     METs 3.38  -  -       Recumbant Bike   Level  - 2 2.5     Minutes  - 10 10     METs  - 2 2       NuStep   Level '3 3 3     '$ Minutes 10 10 10  METs 2 2.1 1.7       Track   Laps '11 13 12     '$ Minutes '10 10 10     '$ METs 2.92 3.26 3.09       Home Exercise Plan   Plans to continue exercise at  -  - Home  Reviewed HEP on 12/19/15. See progress note     Frequency  -  - Add 2 additional days to program exercise sessions.        Exercise Comments:      Exercise Comments    Row Name 11/29/15 1121 12/19/15 1641         Exercise Comments Pt is tolerating exercise well; there are no changes to current Ex Rx. Will continue to monitor pt progress with activity/exercisr Reviewed METs and goals. Pt is tolerating exercise well; will continue to monitor exercise progression.         Discharge Exercise Prescription (Final Exercise Prescription Changes):     Exercise Prescription Changes - 12/19/15 1600      Response to Exercise   Blood Pressure (Admit) 128/84   Blood Pressure (Exercise) 122/80   Blood Pressure (Exit) 116/74   Heart Rate (Admit) 78 bpm   Heart Rate (Exercise) 95 bpm   Heart Rate (Exit) 74 bpm   Rating of Perceived Exertion (Exercise) 11   Comments Reviewed on 12/19/15   Duration Progress to 30 minutes of continuous aerobic without signs/symptoms of physical distress   Intensity THRR unchanged     Progression   Progression Continue to progress workloads to maintain intensity without signs/symptoms of physical  distress.   Average METs 2.2     Resistance Training   Training Prescription Yes   Weight 2 lbs   Reps 10-12     Recumbant Bike   Level 2.5   Minutes 10   METs 2     NuStep   Level 3   Minutes 10   METs 1.7     Track   Laps 12   Minutes 10   METs 3.09     Home Exercise Plan   Plans to continue exercise at [place] on 12/19/15. See progress note   Frequency Add 2 additional days to program exercise sessions.      Nutrition:  Target Goals: Understanding of nutrition guidelines, daily intake of sodium '1500mg'$ , cholesterol '200mg'$ , calories 30% from fat and 7% or less from saturated fats, daily to have 5 or more servings of fruits and vegetables.  Biometrics:     Pre Biometrics - 11/22/15 1336      Pre Biometrics   Waist Circumference 44 inches   Hip Circumference 47.75 inches   Waist to Hip Ratio 0.92 %   Triceps Skinfold 45 mm   % Body Fat 35.7 %   Grip Strength 32.5 kg   Flexibility 8 in   Single Leg Stand 4.8 seconds       Nutrition Therapy Plan and Nutrition Goals:     Nutrition Therapy & Goals - 09/14/15 1456      Nutrition Therapy   Diet Carb Modified, Therapeutic Lifestyle Changes     Personal Nutrition Goals   Personal Goal #1 1-2 lb wt loss per week for a wt loss goal of 6-24 lb   Personal Goal #2 Improved glycemic control as evidenced by a decrease in pt's HbA1c with an ultimate goal of < 7.0     Intervention Plan   Intervention Prescribe, educate and counsel regarding  individualized specific dietary modifications aiming towards targeted core components such as weight, hypertension, lipid management, diabetes, heart failure and other comorbidities.;Nutrition handout(s) given to patient.  1500 kcal, 5 day menu ideas   Expected Outcomes Short Term Goal: Understand basic principles of dietary content, such as calories, fat, sodium, cholesterol and nutrients.;Long Term Goal: Adherence to prescribed nutrition plan.      Nutrition  Discharge: Nutrition Scores:     Nutrition Assessments - 11/23/15 1359      MEDFICTS Scores   Pre Score 6      Nutrition Goals Re-Evaluation:     Nutrition Goals Re-Evaluation    Row Name 12/17/15 0920             Personal Goal #1 Re-Evaluation   Personal Goal #1 1-2 lb wt loss per week for a wt loss goal of 6-24 lb       Goal Progress Seen No       Comments Pt wt is up 1.8 kg.         Personal Goal #2 Re-Evaluation   Personal Goal #2 Improved glycemic control as evidenced by a decrease in pt's HbA1c with an ultimate goal of < 7.0       Goal Progress Seen Yes       Comments Per pt, last A1c 11/2015 was 7.6         Intervention Plan   Intervention Continue to educate, counsel and set short/long term goals regarding individualized specific personal dietary modifications.          Psychosocial: Target Goals: Acknowledge presence or absence of depression, maximize coping skills, provide positive support system. Participant is able to verbalize types and ability to use techniques and skills needed for reducing stress and depression.  Initial Review & Psychosocial Screening:     Initial Psych Review & Screening - 11/30/15 1154      Initial Review   Current issues with History of Depression     Family Dynamics   Good Support System? No   Concerns Recent loss of significant other;Inappropriate over/under dependence on family/friends   Comments pt overdependent on adult son.  pt husband passes away one year ago.       Barriers   Psychosocial barriers to participate in program The patient should benefit from training in stress management and relaxation.     Screening Interventions   Interventions Encouraged to exercise      Quality of Life Scores:     Quality of Life - 12/12/15 1721      Quality of Life Scores   Socioeconomic Pre --  pt presently lives with her son.  there are financial barriers iin the home.  difficulty affording food and utilities.  pt is  being followed by [facility] CSW   GLOBAL Pre --  pt has altered grief pattern from death of spouse 1 year ago. pt suffers from depression, stress and anxiety. pt has been referred to [place] for counseling.       PHQ-9: Recent Review Flowsheet Data    Depression screen [facility] 2/9 12/24/2015 11/29/2015 11/28/2015 09/14/2015 09/03/2015   Decreased Interest 0 0 0 0 0   Down, Depressed, Hopeless 0 0 0 1 1   PHQ - 2 Score 0 0 0 1 1   Altered sleeping 1 - - - -   Tired, decreased energy 1 - - - -   Change in appetite 1 - - - -   Feeling bad or failure about yourself  0 - - - -   Trouble concentrating 0 - - - -   Moving slowly or fidgety/restless 0 - - - -   Suicidal thoughts 0 - - - -   PHQ-9 Score 3 - - - -   Difficult doing work/chores Somewhat difficult - - - -      Psychosocial Evaluation and Intervention:     Psychosocial Evaluation - 12/26/15 1009      Psychosocial Evaluation & Interventions   Interventions Stress management education;Relaxation education;Encouraged to exercise with the program and follow exercise prescription;Therapist referral   Comments pt demonstrates significant situational stress with poor coping skills, pt referred to [facility]. 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 pt has submitted SCAT application and has met with her school advisor to make plans for academic success    Continued Psychosocial Services Needed Yes      Psychosocial Re-Evaluation:     Psychosocial Re-Evaluation    Row Name 12/07/15 0640 12/26/15 1015           Psychosocial Re-Evaluation   Interventions Therapist referral;Physician referral;Stress management education;Relaxation education;Encouraged to attend Cardiac Rehabilitation for the exercise Therapist referral;Stress management education;Relaxation education;Physician referral;Encouraged to attend Cardiac Rehabilitation for the exercise      Comments pt  has significant depression, situational stress and unresolved grief pattern.    -      Continued Psychosocial Services Needed Yes Yes         Vocational Rehabilitation: Provide vocational rehab assistance to qualifying candidates.   Vocational Rehab Evaluation & Intervention:     Vocational Rehab - 08/28/15 1650      Initial Vocational Rehab Evaluation & Intervention   Assessment shows need for Vocational Rehabilitation No      Education: Education Goals: Education classes will be provided on a weekly basis, covering required topics. Participant will state understanding/return demonstration of topics presented.  Learning Barriers/Preferences:     Learning Barriers/Preferences - 11/22/15 0827      Learning Barriers/Preferences   Learning Barriers Sight   Learning Preferences Skilled Demonstration;Verbal Instruction      Education Topics: Count Your Pulse:  -Group instruction provided by verbal instruction, demonstration, patient participation and written materials to support subject.  Instructors address importance of being able to find your pulse and how to count your pulse when at home without a heart monitor.  Patients get hands on experience counting their pulse with staff help and individually.   Heart Attack, Angina, and Risk Factor Modification:  -Group instruction provided by verbal instruction, video, and written materials to support subject.  Instructors address signs and symptoms of angina and heart attacks.    Also discuss risk factors for heart disease and how to make changes to improve heart health risk factors. Flowsheet Row CARDIAC REHAB PHASE II EXERCISE from 12/21/2015 in [facility] CARDIAC REHAB  Date  09/07/15  Instruction Review Code  2- meets goals/outcomes      Functional Fitness:  -Group instruction provided by verbal instruction, demonstration, patient participation, and written materials to support subject.  Instructors address  safety measures for doing things around the house.  Discuss how to get up and down off the floor, how to pick things up properly, how to safely get out of a chair without assistance, and balance training.   Meditation and Mindfulness:  -Group instruction provided by verbal instruction, patient participation, and written materials to support subject.  Instructor addresses importance of mindfulness and  meditation practice to help reduce stress and improve awareness.  Instructor also leads participants through a meditation exercise.    Stretching for Flexibility and Mobility:  -Group instruction provided by verbal instruction, patient participation, and written materials to support subject.  Instructors lead participants through series of stretches that are designed to increase flexibility thus improving mobility.  These stretches are additional exercise for major muscle groups that are typically performed during regular warm up and cool down. Flowsheet Row CARDIAC REHAB PHASE II EXERCISE from 12/21/2015 in [place]  Date  09/05/15  Educator  Luetta Nutting Iline Oven  Instruction Review Code  2- meets goals/outcomes      Hands Only CPR Anytime:  -Group instruction provided by verbal instruction, video, patient participation and written materials to support subject.  Instructors co-teach with AHA video for hands only CPR.  Participants get hands on experience with mannequins.   Nutrition I class: Heart Healthy Eating:  -Group instruction provided by PowerPoint slides, verbal discussion, and written materials to support subject matter. The instructor gives an explanation and review of the Therapeutic Lifestyle Changes diet recommendations, which includes a discussion on lipid goals, dietary fat, sodium, fiber, plant stanol/sterol esters, sugar, and the components of a well-balanced, healthy diet. Flowsheet Row CARDIAC REHAB PHASE II EXERCISE from 12/21/2015 in [place]  Date  09/14/15  Educator  RD  Instruction Review Code  Not applicable [class handout given]      Nutrition II class: Lifestyle Skills:  -Group instruction provided by PowerPoint slides, verbal discussion, and written materials to support subject matter. The instructor gives an explanation and review of label reading, grocery shopping for heart health, heart healthy recipe modifications, and ways to make healthier choices when eating out. Flowsheet Row CARDIAC REHAB PHASE II EXERCISE from 12/21/2015 in [place]  Date  09/14/15  Educator  RD  Instruction Review Code  Not applicable [class handout given]      Diabetes Question & Answer:  -Group instruction provided by PowerPoint slides, verbal discussion, and written materials to support subject matter. The instructor gives an explanation and review of diabetes co-morbidities, pre- and post-prandial blood glucose goals, pre-exercise blood glucose goals, signs, symptoms, and treatment of hypoglycemia and hyperglycemia, and foot care basics. Flowsheet Row CARDIAC REHAB PHASE II EXERCISE from 12/21/2015 in [place]  Date  09/14/15  Educator  RD  Instruction Review Code  Not applicable [class handout given]      Diabetes Blitz:  -Group instruction provided by PowerPoint slides, verbal discussion, and written materials to support subject matter. The instructor gives an explanation and review of the physiology behind type 1 and type 2 diabetes, diabetes medications and rational behind using different medications, pre- and post-prandial blood glucose recommendations and Hemoglobin A1c goals, diabetes diet, and exercise including blood glucose guidelines for exercising safely.  Flowsheet Row CARDIAC REHAB PHASE II EXERCISE from 12/21/2015 in [place]  Date  09/14/15  Educator  RD  Instruction Review Code  Not  applicable [class handout given]      Portion Distortion:  -Group instruction provided by PowerPoint slides, verbal discussion, written materials, and food models to support subject matter. The instructor gives an explanation of serving size versus portion size, changes in portions sizes over the last 20 years, and what consists of a serving from each food group. Flowsheet Row CARDIAC REHAB PHASE II EXERCISE from 12/21/2015  in [place]  Date  12/19/15  Educator  RD  Instruction Review Code  2- meets goals/outcomes      Stress Management:  -Group instruction provided by verbal instruction, video, and written materials to support subject matter.  Instructors review role of stress in heart disease and how to cope with stress positively.   Flowsheet Row CARDIAC REHAB PHASE II EXERCISE from 12/21/2015 in [place]  Date  12/12/15  Instruction Review Code  2- meets goals/outcomes      Exercising on Your Own:  -Group instruction provided by verbal instruction, power point, and written materials to support subject.  Instructors discuss benefits of exercise, components of exercise, frequency and intensity of exercise, and end points for exercise.  Also discuss use of nitroglycerin and activating EMS.  Review options of places to exercise outside of rehab.  Review guidelines for sex with heart disease. Flowsheet Row CARDIAC REHAB PHASE II EXERCISE from 12/21/2015 in [place]  Date  12/05/15  Instruction Review Code  2- meets goals/outcomes      Cardiac Drugs I:  -Group instruction provided by verbal instruction and written materials to support subject.  Instructor reviews cardiac drug classes: antiplatelets, anticoagulants, beta blockers, and statins.  Instructor discusses reasons, side effects, and lifestyle considerations for each drug class.   Cardiac Drugs II:  -Group instruction provided by  verbal instruction and written materials to support subject.  Instructor reviews cardiac drug classes: angiotensin converting enzyme inhibitors (ACE-I), angiotensin II receptor blockers (ARBs), nitrates, and calcium channel blockers.  Instructor discusses reasons, side effects, and lifestyle considerations for each drug class. Flowsheet Row CARDIAC REHAB PHASE II EXERCISE from 12/21/2015 in [place]  Date  11/28/15  Educator  pharmacy  Instruction Review Code  2- meets goals/outcomes      Anatomy and Physiology of the Circulatory System:  -Group instruction provided by verbal instruction, video, and written materials to support subject.  Reviews functional anatomy of heart, how it relates to various diagnoses, and what role the heart plays in the overall system.   Knowledge Questionnaire Score:     Knowledge Questionnaire Score - 11/23/15 1359      Knowledge Questionnaire Score   Pre Score     DM 13/15      Core Components/Risk Factors/Patient Goals at Admission:     Personal Goals and Risk Factors at Admission - 11/22/15 0828      Core Components/Risk Factors/Patient Goals on Admission    Weight Management Obesity;Weight Loss;Yes   Intervention Weight Management: Develop a combined nutrition and exercise program designed to reach desired caloric intake, while maintaining appropriate intake of nutrient and fiber, sodium and fats, and appropriate energy expenditure required for the weight goal.;Weight Management: Provide education and appropriate resources to help participant work on and attain dietary goals.;Weight Management/Obesity: Establish reasonable short term and long term weight goals.;Obesity: Provide education and appropriate resources to help participant work on and attain dietary goals.   Expected Outcomes Long Term: Adherence to nutrition and physical activity/exercise program aimed toward attainment of established weight goal;Short Term:  Continue to assess and modify interventions until short term weight is achieved;Weight Maintenance: Understanding of the daily nutrition guidelines, which includes 25-35% calories from fat, 7% or less cal from saturated fats, less than '200mg'$  cholesterol, less than 1.5gm of sodium, & 5 or more servings of fruits and vegetables daily;Weight Loss: Understanding of general recommendations for  a balanced deficit meal plan, which promotes 1-2 lb weight loss per week and includes a negative energy balance of [phone] kcal/d;Understanding recommendations for meals to include 15-35% energy as protein, 25-35% energy from fat, 35-60% energy from carbohydrates, less than '200mg'$  of dietary cholesterol, 20-35 gm of total fiber daily;Understanding of distribution of calorie intake throughout the day with the consumption of 4-5 meals/snacks   Increase Strength and Stamina Yes   Intervention Provide advice, education, support and counseling about physical activity/exercise needs.;Develop an individualized exercise prescription for aerobic and resistive training based on initial evaluation findings, risk stratification, comorbidities and participant's personal goals.   Expected Outcomes Achievement of increased cardiorespiratory fitness and enhanced flexibility, muscular endurance and strength shown through measurements of functional capacity and personal statement of participant.   Improve shortness of breath with ADL's Yes   Intervention Provide education, individualized exercise plan and daily activity instruction to help decrease symptoms of SOB with activities of daily living.   Expected Outcomes Short Term: Achieves a reduction of symptoms when performing activities of daily living.   Diabetes Yes   Intervention Provide education about signs/symptoms and action to take for hypo/hyperglycemia.;Provide education about proper nutrition, including hydration, and aerobic/resistive exercise prescription along with prescribed  medications to achieve blood glucose in normal ranges: Fasting glucose 65-99 mg/dL   Expected Outcomes Short Term: Participant verbalizes understanding of the signs/symptoms and immediate care of hyper/hypoglycemia, proper foot care and importance of medication, aerobic/resistive exercise and nutrition plan for blood glucose control.;Long Term: Attainment of HbA1C < 7%.   Hypertension Yes   Intervention Provide education on lifestyle modifcations including regular physical activity/exercise, weight management, moderate sodium restriction and increased consumption of fresh fruit, vegetables, and low fat dairy, alcohol moderation, and smoking cessation.;Monitor prescription use compliance.   Expected Outcomes Short Term: Continued assessment and intervention until BP is < 140/79m HG in hypertensive participants. < 130/852mHG in hypertensive participants with diabetes, heart failure or chronic kidney disease.;Long Term: Maintenance of blood pressure at goal levels.   Lipids Yes   Intervention Provide education and support for participant on nutrition & aerobic/resistive exercise along with prescribed medications to achieve LDL '70mg'$ , HDL >'40mg'$ .   Expected Outcomes Short Term: Participant states understanding of desired cholesterol values and is compliant with medications prescribed. Participant is following exercise prescription and nutrition guidelines.;Long Term: Cholesterol controlled with medications as prescribed, with individualized exercise RX and with personalized nutrition plan. Value goals: LDL < '70mg'$ , HDL > 40 mg.   Stress Yes   Intervention Offer individual and/or small group education and counseling on adjustment to heart disease, stress management and health-related lifestyle change. Teach and support self-help strategies.;Refer participants experiencing significant psychosocial distress to appropriate mental health specialists for further evaluation and treatment. When possible, include family  members and significant others in education/counseling sessions.   Expected Outcomes Short Term: Participant demonstrates changes in health-related behavior, relaxation and other stress management skills, ability to obtain effective social support, and compliance with psychotropic medications if prescribed.;Long Term: Emotional wellbeing is indicated by absence of clinically significant psychosocial distress or social isolation.   Personal Goal Other Yes   Personal Goal To "get some me time" and focus on self. Gain independence and get life back   Intervention Provide stress management classes and coping mechanisms to assist with improving self assurance and self confidence   Expected Outcomes Pt will be able to live independently, get life back and focus on self      Core Components/Risk Factors/Patient Goals  Review:      Goals and Risk Factor Review    Row Name 12/19/15 1642             Core Components/Risk Factors/Patient Goals Review   Personal Goals Review Other       Review Pt struggles to get "me time"; lives in a high stressfull situation. Discussed coping mechanisms for stress and different ways to distress and focus on self.        Expected Outcomes Pt will learn better coping mechanisms for stress and take better care of self.          Core Components/Risk Factors/Patient Goals at Discharge (Final Review):      Goals and Risk Factor Review - 12/19/15 1642      Core Components/Risk Factors/Patient Goals Review   Personal Goals Review Other   Review Pt struggles to get "me time"; lives in a high stressfull situation. Discussed coping mechanisms for stress and different ways to distress and focus on self.    Expected Outcomes Pt will learn better coping mechanisms for stress and take better care of self.      ITP Comments:     ITP Comments    Row Name 08/28/15 1642 11/22/15 0819 12/21/15 1351       ITP Comments Dr. Fransico Him, MD Medical Director  Dr. Fransico Him, MD Medical Director  Patient attended the "Taking the high out of high blood pressure" video/lecture education class on 12/21/15. Met outcomes/goals.        Comments: Pt is not  making expected progress toward personal goals after completing 5 sessions. Pt has frequent absences due to her rehab barriers, including transportation, college and poor coping skills.   Recommend continued exercise and life style modification education including  stress management and relaxation techniques to decrease cardiac risk profile.
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
## 2015-12-27 ENCOUNTER — Ambulatory Visit: Payer: Commercial Managed Care - HMO | Admitting: Skilled Nursing Facility1

## 2015-12-28 ENCOUNTER — Encounter (HOSPITAL_COMMUNITY)
Admission: RE | Admit: 2015-12-28 | Discharge: 2015-12-28 | Disposition: A | Discharge status: Home / Self Care | Payer: Commercial Managed Care - HMO | Source: Ambulatory Visit | Attending: Cardiology | Admitting: Cardiology

## 2015-12-28 DIAGNOSIS — I214 Non-ST elevation (NSTEMI) myocardial infarction: Secondary | ICD-10-CM | POA: Diagnosis not present

## 2015-12-28 DIAGNOSIS — Z951 Presence of aortocoronary bypass graft: Secondary | ICD-10-CM | POA: Diagnosis not present

## 2015-12-28 DIAGNOSIS — I2111 ST elevation (STEMI) myocardial infarction involving right coronary artery: Secondary | ICD-10-CM

## 2015-12-28 DIAGNOSIS — Z79899 Other long term (current) drug therapy: Secondary | ICD-10-CM | POA: Diagnosis not present

## 2015-12-31 ENCOUNTER — Encounter (HOSPITAL_COMMUNITY)
Admission: RE | Admit: 2015-12-31 | Discharge: 2015-12-31 | Disposition: A | Discharge status: Home / Self Care | Payer: Commercial Managed Care - HMO | Source: Ambulatory Visit | Attending: Cardiology | Admitting: Cardiology

## 2015-12-31 DIAGNOSIS — Z951 Presence of aortocoronary bypass graft: Secondary | ICD-10-CM

## 2015-12-31 DIAGNOSIS — I2111 ST elevation (STEMI) myocardial infarction involving right coronary artery: Secondary | ICD-10-CM

## 2015-12-31 DIAGNOSIS — Z79899 Other long term (current) drug therapy: Secondary | ICD-10-CM | POA: Diagnosis not present

## 2015-12-31 DIAGNOSIS — I214 Non-ST elevation (NSTEMI) myocardial infarction: Secondary | ICD-10-CM | POA: Diagnosis not present

## 2015-12-31 LAB — GLUCOSE, CAPILLARY
Glucose-Capillary: 99 mg/dL (ref 65–99)
Glucose-Capillary: 125 mg/dL — ABNORMAL HIGH (ref 65–99)

## 2016-01-02 ENCOUNTER — Encounter (HOSPITAL_COMMUNITY)
Admission: RE | Admit: 2016-01-02 | Discharge: 2016-01-02 | Disposition: A | Discharge status: Home / Self Care | Payer: Commercial Managed Care - HMO | Source: Ambulatory Visit | Attending: Cardiology | Admitting: Cardiology

## 2016-01-02 DIAGNOSIS — I214 Non-ST elevation (NSTEMI) myocardial infarction: Secondary | ICD-10-CM | POA: Diagnosis not present

## 2016-01-02 DIAGNOSIS — Z79899 Other long term (current) drug therapy: Secondary | ICD-10-CM | POA: Diagnosis not present

## 2016-01-02 DIAGNOSIS — Z951 Presence of aortocoronary bypass graft: Secondary | ICD-10-CM | POA: Diagnosis not present

## 2016-01-02 DIAGNOSIS — I2111 ST elevation (STEMI) myocardial infarction involving right coronary artery: Secondary | ICD-10-CM

## 2016-01-04 ENCOUNTER — Encounter (HOSPITAL_COMMUNITY)
Admission: RE | Admit: 2016-01-04 | Discharge: 2016-01-04 | Disposition: A | Discharge status: Home / Self Care | Payer: Commercial Managed Care - HMO | Source: Ambulatory Visit | Attending: Cardiology | Admitting: Cardiology

## 2016-01-04 DIAGNOSIS — I214 Non-ST elevation (NSTEMI) myocardial infarction: Secondary | ICD-10-CM | POA: Diagnosis not present

## 2016-01-04 DIAGNOSIS — Z951 Presence of aortocoronary bypass graft: Secondary | ICD-10-CM | POA: Diagnosis not present

## 2016-01-04 DIAGNOSIS — I2111 ST elevation (STEMI) myocardial infarction involving right coronary artery: Secondary | ICD-10-CM

## 2016-01-04 DIAGNOSIS — Z79899 Other long term (current) drug therapy: Secondary | ICD-10-CM | POA: Diagnosis not present

## 2016-01-07 ENCOUNTER — Other Ambulatory Visit: Payer: Self-pay | Admitting: *Deleted

## 2016-01-07 ENCOUNTER — Encounter (HOSPITAL_COMMUNITY)
Admission: RE | Admit: 2016-01-07 | Discharge: 2016-01-07 | Disposition: A | Discharge status: Home / Self Care | Payer: Commercial Managed Care - HMO | Source: Ambulatory Visit | Attending: Cardiology | Admitting: Cardiology

## 2016-01-07 ENCOUNTER — Encounter: Payer: Self-pay | Admitting: *Deleted

## 2016-01-07 DIAGNOSIS — Z79899 Other long term (current) drug therapy: Secondary | ICD-10-CM | POA: Diagnosis not present

## 2016-01-07 DIAGNOSIS — I214 Non-ST elevation (NSTEMI) myocardial infarction: Secondary | ICD-10-CM | POA: Diagnosis not present

## 2016-01-07 DIAGNOSIS — I2111 ST elevation (STEMI) myocardial infarction involving right coronary artery: Secondary | ICD-10-CM

## 2016-01-07 DIAGNOSIS — Z951 Presence of aortocoronary bypass graft: Secondary | ICD-10-CM

## 2016-01-07 NOTE — Progress Notes (Signed)
Completed [facility] eligibility form for patient to received services from [facility] due to her inability to  negotiate the terrain in her neighborhood to reach the bus stop. Pt given the completed form to take to [facility]. Pt appreciated the assistance. Cherre Huger, BSN

## 2016-01-07 NOTE — Patient Outreach (Signed)
[facility]) Care Management  01/07/2016  [name] [name] 1960/12/20 [id number]   CSW will proceed with case closure on patient, due to inability to establish initial phone contact, despite required number of attempts made and outreach letter mailed to patient's house, with no response.    CSW will notify patient's RNCM with [facility], of CSW's plans to close patient's case.  CSW will fax an update to patient's Primary Care Physician, Dr. Emilio Aspen ensure that they are aware of CSW's involvement with patient's plan of care.  CSW will submit a case closure request to Verlon Setting, Care Management Assistant with [facility], in the form of an In [employer].   Nat Christen, BSW, MSW, LCSW  Licensed Education officer, environmental [facility]  Mailing [place] [address] [place], [place] [address] Physical Address-[address], [place], [place] [address] Toll Free Main # [phone] Fax # [phone] Cell # [phone]  Fax # [phone]  [email or web address]

## 2016-01-09 ENCOUNTER — Encounter (HOSPITAL_COMMUNITY)
Admission: RE | Admit: 2016-01-09 | Discharge: 2016-01-09 | Disposition: A | Discharge status: Home / Self Care | Payer: Commercial Managed Care - HMO | Source: Ambulatory Visit | Attending: Cardiology | Admitting: Cardiology

## 2016-01-09 DIAGNOSIS — Z951 Presence of aortocoronary bypass graft: Secondary | ICD-10-CM

## 2016-01-09 DIAGNOSIS — I2111 ST elevation (STEMI) myocardial infarction involving right coronary artery: Secondary | ICD-10-CM

## 2016-01-09 DIAGNOSIS — Z79899 Other long term (current) drug therapy: Secondary | ICD-10-CM | POA: Diagnosis not present

## 2016-01-09 DIAGNOSIS — I214 Non-ST elevation (NSTEMI) myocardial infarction: Secondary | ICD-10-CM | POA: Diagnosis not present

## 2016-01-10 ENCOUNTER — Other Ambulatory Visit: Payer: Self-pay

## 2016-01-10 NOTE — Patient Outreach (Signed)
[name] [name] October 12, 1960 [id number]  Subjective: [name] [name] is a 25 YOF who was referred to [facility] by Quinn Plowman, RN for polypharmacy and complete medication review. Patient denies issues remembering (uses pillbox) or affording her medications at this time. Patient reports "not touching a cigarette" since March 2017. Quay Burow, [place], performed a CMR and provided her with [facility] pharmacy phone numbers to use if anything medication related is needed.   Objective:  Current Medications:  APAP 325 mg: 2 tabs q4h prn mild pain and headaches. Pt reports not needing for about 2 weeks.  ASA 81 mg: 1 tab daily. Atorvastatin 80 mg: 1 tab daily at 6pm. Biotin 1 mg: 2 caps daily. Vitamin D3 50,000 units: 1 cap wkly. Pt reports taking on Sundays. Invokana 300 mg: 1 tab daily. Geritol Complete: 1 tab daily. Isosorbide mononitrate 30 mg: 0.5 tab daily Metformin 1000 mg: 1 tab BID. Metoprolol tartrate 37.5 mg: 1 tab BID. Nitroglycerin 0.4 mg: 1 tab SL q16min prn chest pain, max 3 doses. Ticagrelor 90 mg: 1 tab BID. Tramadol 50 mg: 1 tab q6h prn. Pt reports not needing since July. OTC hydrocortisone: apply to incision site rash (from surgery) daily. Pt is self-treating and reports to have applied to rash for 1 week but has not worked. Cephalexin 500 mg: Pt reports finishing course (unknown duration) 1 month ago. Pt reports receiving this medication "every so often" for exacerbations of her overactive sweat glands.  OTC vaginal cream used for "yeast infections caused by either Januvia or invokana" for duration of 7 days, which works for 1-2 weeks until yeast infection returns.  Allergies: NKDA   Assessment:  Drugs sorted by system:  Neurologic/Psychologic: none reported.   Cardiovascular: ASA, atorvastatin, isosorbide mononitrate, metoprolol tartrate, nitroglycerin, ticagrelor.  Pulmonary/Allergy: none reported.  Gastrointestinal: none reported.  Endocrine:  invokana, Januvia, metformin.  Renal: none reported.   Topical: otc hydrocortisone  Pain: tramadol, apap  Ocular: none reported.  Miscellaneous: biotin, vit d3, geritol complete, cephalexin.  Duplications in therapy: none.  Gaps in therapy: Pt has DM, HTN, and CAD s/p CABG without an ACEI/ARB on profile currently. Pt was on olmesartan/hctz/amlodipine combo from April admission until July admission per 10/08/15 discharge summary due to stable borderline BP, to be reassessed at a follow-up appointment. No later notes mentioning ACEI/ARB found on Epic.  Medications to avoid in the elderly: Patient is under 5.  Drug interactions: none  Other issues noted: none  Plan: 1. If albumin to creatinine ration becomes elevated, consider adding and ACEI/ARB to preserve renal function in diabetes. 2. No other drug interaction or problems noted.  3. I will close her out to pharmacy since all her pharmacy issues have been resolved.  I am happy to assist in the future if any pharmacy issues arise again.   Deanne Coffer, PharmD, [place] [phone]

## 2016-01-11 ENCOUNTER — Encounter (HOSPITAL_COMMUNITY)
Admission: RE | Admit: 2016-01-11 | Discharge: 2016-01-11 | Disposition: A | Discharge status: Home / Self Care | Payer: Commercial Managed Care - HMO | Source: Ambulatory Visit | Attending: Cardiology | Admitting: Cardiology

## 2016-01-11 DIAGNOSIS — Z951 Presence of aortocoronary bypass graft: Secondary | ICD-10-CM | POA: Diagnosis not present

## 2016-01-11 DIAGNOSIS — I214 Non-ST elevation (NSTEMI) myocardial infarction: Secondary | ICD-10-CM | POA: Diagnosis not present

## 2016-01-11 DIAGNOSIS — I2111 ST elevation (STEMI) myocardial infarction involving right coronary artery: Secondary | ICD-10-CM

## 2016-01-11 DIAGNOSIS — Z79899 Other long term (current) drug therapy: Secondary | ICD-10-CM | POA: Diagnosis not present

## 2016-01-11 LAB — GLUCOSE, CAPILLARY: Glucose-Capillary: 114 mg/dL — ABNORMAL HIGH (ref 65–99)

## 2016-01-11 NOTE — Progress Notes (Signed)
Pt returns to rehab today.  Pt with multiple complaints ranging from shortness of breath to dizziness.  Pt reports feeling extremely stressed with family and school life.  Bp  Elevated 150/86.  Pt did not take her medications today.  Blood glucose checked 114.  Pt ate a plum for breakfast.  Pt given a snack to eat and was able to exercise two stations and felt better.  Difficult to assess physical symptoms due to pt emotional state and constant stress.  Pt feels her life is out of control and she is not supported by her family. Pt has a paper due today that she has not finished and she feels stressed about this. Pt desires to move out of the place she is living but due to limited resources she is unable to   Pt is considering counseling support but is reluctant to share the details of her life with someone.  Pt could go to a counselor at [facility] at no additional financial cost but struggles with  trust issues.  Pt plans to talk with her academeic counselor regarding course load.  Pt thinks she may sit out a semester and not take any classes for the spring.  Continue to support and encourage pt. Cherre Huger, BSN

## 2016-01-14 ENCOUNTER — Encounter (HOSPITAL_COMMUNITY)
Admission: RE | Admit: 2016-01-14 | Discharge: 2016-01-14 | Disposition: A | Discharge status: Home / Self Care | Payer: Commercial Managed Care - HMO | Source: Ambulatory Visit | Attending: Cardiology | Admitting: Cardiology

## 2016-01-14 DIAGNOSIS — I214 Non-ST elevation (NSTEMI) myocardial infarction: Secondary | ICD-10-CM | POA: Diagnosis not present

## 2016-01-14 DIAGNOSIS — Z79899 Other long term (current) drug therapy: Secondary | ICD-10-CM | POA: Diagnosis not present

## 2016-01-14 DIAGNOSIS — I2111 ST elevation (STEMI) myocardial infarction involving right coronary artery: Secondary | ICD-10-CM

## 2016-01-14 DIAGNOSIS — Z951 Presence of aortocoronary bypass graft: Secondary | ICD-10-CM

## 2016-01-16 ENCOUNTER — Encounter (HOSPITAL_COMMUNITY)
Admission: RE | Admit: 2016-01-16 | Discharge: 2016-01-16 | Disposition: A | Discharge status: Home / Self Care | Payer: Commercial Managed Care - HMO | Source: Ambulatory Visit | Attending: Cardiology | Admitting: Cardiology

## 2016-01-16 DIAGNOSIS — Z951 Presence of aortocoronary bypass graft: Secondary | ICD-10-CM

## 2016-01-16 DIAGNOSIS — I2111 ST elevation (STEMI) myocardial infarction involving right coronary artery: Secondary | ICD-10-CM

## 2016-01-16 DIAGNOSIS — I214 Non-ST elevation (NSTEMI) myocardial infarction: Secondary | ICD-10-CM | POA: Insufficient documentation

## 2016-01-16 DIAGNOSIS — Z79899 Other long term (current) drug therapy: Secondary | ICD-10-CM | POA: Insufficient documentation

## 2016-01-17 ENCOUNTER — Encounter: Payer: Commercial Managed Care - HMO | Attending: Internal Medicine | Admitting: Skilled Nursing Facility1

## 2016-01-17 ENCOUNTER — Encounter: Payer: Self-pay | Admitting: Skilled Nursing Facility1

## 2016-01-17 DIAGNOSIS — E1121 Type 2 diabetes mellitus with diabetic nephropathy: Secondary | ICD-10-CM | POA: Insufficient documentation

## 2016-01-17 DIAGNOSIS — Z713 Dietary counseling and surveillance: Secondary | ICD-10-CM | POA: Diagnosis not present

## 2016-01-17 DIAGNOSIS — E119 Type 2 diabetes mellitus without complications: Secondary | ICD-10-CM

## 2016-01-17 NOTE — Progress Notes (Signed)
  Diabetes Self-Management Education  Visit Type:    Appt. Start Time: 2:00 Appt. End Time: 3:50  01/17/2016  [name], identified by name and date of birth, is a 55 y.o. female with a diagnosis of Diabetes:  .   ASSESSMENT  There were no vitals taken for this visit. There is no height or weight on file to calculate BMI. Pt states she loves sweet tea. Pt states she starts cardiac rehab tomorrow. Pt states her husband passed away a year ago. Pt states she wants to write a book about caring for her husband who had sepsis from a gunshot wound. Pt states her physician told her she has a bacterial infection/sepsis. Pt states she struggles financially but does not qualify for food stamps so her children provide food for her but their food stamps are getting cut. Pt states she is in a college program currently. Pt states her first heart attack felt like she had the flu. Pt states one of her sons is in prison and the other son lives with her and his family. Pt states she wants to live alone. Pt states she feels like there are too many people in her space bothering her. Pt states she is lactose intolerant. Pt states she has been working on decreasing her sugar intake. Pt states she loves fish but she cannot have it because her daughter in law is allergic as well as shellfish due to her son being allergic. Pt states her physician wants her to test her blood sugar 7 times. Pt states her numbers have not been over 120-checking a couple times a week but never after a meal.  Pt is talkative.  For next visit: diet information.     Pt states she has not been working on anything but focusing on school including diabetes management. Pt states school ends the ninth of december and she is feeling very depressed. Pt states she just needs to clear her mind for a while. Pt states food is not a issue because she barley eats. Pt states she is working on her affording her groceries and living issue. Pt states her  son will get out of prison 2019 and she wants him to live with her. Pt states once she gets pre qualified for a mortgage.

## 2016-01-18 ENCOUNTER — Encounter (HOSPITAL_COMMUNITY)
Admission: RE | Admit: 2016-01-18 | Discharge: 2016-01-18 | Disposition: A | Discharge status: Home / Self Care | Payer: Commercial Managed Care - HMO | Source: Ambulatory Visit | Attending: Cardiology | Admitting: Cardiology

## 2016-01-18 DIAGNOSIS — Z951 Presence of aortocoronary bypass graft: Secondary | ICD-10-CM | POA: Diagnosis not present

## 2016-01-18 DIAGNOSIS — I2111 ST elevation (STEMI) myocardial infarction involving right coronary artery: Secondary | ICD-10-CM

## 2016-01-18 DIAGNOSIS — I214 Non-ST elevation (NSTEMI) myocardial infarction: Secondary | ICD-10-CM | POA: Diagnosis not present

## 2016-01-18 DIAGNOSIS — Z79899 Other long term (current) drug therapy: Secondary | ICD-10-CM | POA: Diagnosis not present

## 2016-01-21 ENCOUNTER — Encounter (HOSPITAL_COMMUNITY)
Admission: RE | Admit: 2016-01-21 | Discharge: 2016-01-21 | Disposition: A | Discharge status: Home / Self Care | Payer: Commercial Managed Care - HMO | Source: Ambulatory Visit | Attending: Cardiology | Admitting: Cardiology

## 2016-01-21 DIAGNOSIS — Z79899 Other long term (current) drug therapy: Secondary | ICD-10-CM | POA: Diagnosis not present

## 2016-01-21 DIAGNOSIS — Z951 Presence of aortocoronary bypass graft: Secondary | ICD-10-CM | POA: Diagnosis not present

## 2016-01-21 DIAGNOSIS — I2111 ST elevation (STEMI) myocardial infarction involving right coronary artery: Secondary | ICD-10-CM

## 2016-01-21 DIAGNOSIS — I214 Non-ST elevation (NSTEMI) myocardial infarction: Secondary | ICD-10-CM | POA: Diagnosis not present

## 2016-01-23 ENCOUNTER — Encounter (HOSPITAL_COMMUNITY)
Admission: RE | Admit: 2016-01-23 | Discharge: 2016-01-23 | Disposition: A | Discharge status: Home / Self Care | Payer: Commercial Managed Care - HMO | Source: Ambulatory Visit | Attending: Cardiology | Admitting: Cardiology

## 2016-01-23 DIAGNOSIS — Z951 Presence of aortocoronary bypass graft: Secondary | ICD-10-CM

## 2016-01-23 DIAGNOSIS — I2111 ST elevation (STEMI) myocardial infarction involving right coronary artery: Secondary | ICD-10-CM

## 2016-01-23 DIAGNOSIS — Z79899 Other long term (current) drug therapy: Secondary | ICD-10-CM | POA: Diagnosis not present

## 2016-01-23 DIAGNOSIS — I214 Non-ST elevation (NSTEMI) myocardial infarction: Secondary | ICD-10-CM | POA: Diagnosis not present

## 2016-01-23 LAB — GLUCOSE, CAPILLARY: Glucose-Capillary: 123 mg/dL — ABNORMAL HIGH (ref 65–99)

## 2016-01-23 NOTE — Progress Notes (Signed)
Cardiac Individual Treatment Plan  Patient Details  Name: [name] [name] MRN: [id number] Date of Birth: Jun 26, 1960 Referring Provider:   Flowsheet Row CARDIAC REHAB PHASE II ORIENTATION from 11/22/2015 in [facility]  Referring Provider  Martinique, Peter MD      Initial Encounter Date:  [facility] from 11/22/2015 in [place]  Date  11/22/15  Referring Provider  Martinique, Peter MD      Visit Diagnosis: 10/11/15 ST elevation myocardial infarction involving right coronary artery (HCC)  S/P CABG (coronary artery bypass graft)  Patient's Home Medications on Admission:  Current Outpatient Prescriptions:  .  acetaminophen (TYLENOL) 325 MG tablet, Take 2 tablets (650 mg total) by mouth every 4 (four) hours as needed for headache or mild pain., Disp: , Rfl:  .  aspirin EC 81 MG EC tablet, Take 1 tablet (81 mg total) by mouth daily., Disp: , Rfl:  .  atorvastatin (LIPITOR) 80 MG tablet, TAKE 1 TABLET BY MOUTH DAILY AT 6PM, Disp: 90 tablet, Rfl: 3 .  Biotin 1 MG CAPS, Take 2 capsules by mouth., Disp: , Rfl:  .  Cholecalciferol (VITAMIN D3) 50000 units CAPS, Take 50,000 Units by mouth once a week., Disp: , Rfl:  .  INVOKANA 300 MG TABS tablet, Take 300 mg by mouth daily., Disp: , Rfl:  .  Iron-Vitamins (GERITOL COMPLETE PO), Take 1 tablet by mouth daily., Disp: , Rfl:  .  isosorbide mononitrate (IMDUR) 30 MG 24 hr tablet, Take 0.5 tablets (15 mg total) by mouth daily., Disp: 45 tablet, Rfl: 3 .  JANUVIA 100 MG tablet, Take 100 mg by mouth daily., Disp: , Rfl:  .  metFORMIN (GLUCOPHAGE) 1000 MG tablet, Take 1,000 mg by mouth 2 (two) times daily., Disp: , Rfl:  .  Metoprolol Tartrate 37.5 MG TABS, Take 37.5 mg by mouth 2 (two) times daily., Disp: 180 tablet, Rfl: 3 .  nitroGLYCERIN (NITROSTAT) 0.4 MG SL tablet, Place 1 tablet (0.4 mg total) under the tongue every 5 (five) minutes as needed for chest  pain. MAX 3 doses, Disp: 25 tablet, Rfl: 1 .  ticagrelor (BRILINTA) 90 MG TABS tablet, Take 1 tablet (90 mg total) by mouth 2 (two) times daily., Disp: 180 tablet, Rfl: 1 .  traMADol (ULTRAM) 50 MG tablet, Take 1 tablet (50 mg total) by mouth every 6 (six) hours as needed., Disp: 30 tablet, Rfl: 0  Past Medical History: Past Medical History:  Diagnosis Date  . Acute ST elevation myocardial infarction (STEMI) involving right coronary artery ([place]) 10/04/2015  . Anxiety   . Anxiety and depression   . CAD (coronary artery disease)    a. CABG 06/2015: LIMA-LAD, SVG-RCA, SVG-OM2 b. STEMI s/p PCI to distal RCA lesion with a small Promus Premier stent. ( patent LIMA to the LAD, occluded SVG to OM, occluded SVG to RCA.)  . CAD (coronary artery disease) of artery bypass graft; occluded SVG to RCA and occluded SVG to OM 10/08/2015  . Empty sella ([place])   . Hidradenitis axillaris   . Hyperlipemia   . Hypertension   . Kidney stones   . Lupus dx'd 2008   "they think I have this; have to get retested" (06/12/2015)  . Obesity (BMI 30.0-34.9)   . Ovarian cyst   . Pneumonia X 2  . PONV (postoperative nausea and vomiting)   . Rheumatoid aortitis   . Rheumatoid arthritis ([place])   . S/P angioplasty with  stent; 10/04/15 to distal RCA lesion. with Promus premier. 10/08/2015  . Scoliosis   . Stroke [facility]) March 2014   left MCA branches; "they say I have them all the time", denies residual on 06/12/2015  . Tobacco abuse   . Type II diabetes mellitus (HCC)     Tobacco Use: History  Smoking Status  . Former Smoker  . Packs/day: 0.50  . Years: 36.00  . Types: Cigarettes  . Quit date: 06/12/2014  Smokeless Tobacco  . Never Used    Labs: Recent Review Flowsheet Data    Labs for ITP Cardiac and Pulmonary Rehab Latest Ref Rng & Units 06/19/2015 06/20/2015 06/20/2015 10/04/2015 10/05/2015   Cholestrol 0 - 200 mg/dL - - - - 160   LDLCALC 0 - 99 mg/dL - - - - 76   HDL >40 mg/dL - - - - 56   Trlycerides <150 mg/dL - -  - - 142   Hemoglobin A1c 4.8 - 5.6 % - - - - -   PHART 7.350 - 7.450 - 7.374 - - -   PCO2ART 35.0 - 45.0 mmHg - 44.0 - - -   HCO3 20.0 - 24.0 mEq/L - 25.4(H) - - -   TCO2 0 - 100 mmol/L _0 -   ACIDBASEDEF 0.0 - 2.0 mmol/L - - - - -   O2SAT % - 92.0 - - -      Capillary Blood Glucose: Lab Results  Component Value Date   GLUCAP 123 (H) 01/23/2016   GLUCAP 114 (H) 01/11/2016   GLUCAP 125 (H) 12/31/2015   GLUCAP 99 12/31/2015   GLUCAP 141 (H) 12/21/2015     Exercise Target Goals:    Exercise Program Goal: Individual exercise prescription set with THRR, safety & activity barriers. Participant demonstrates ability to understand and report RPE using BORG scale, to self-measure pulse accurately, and to acknowledge the importance of the exercise prescription.  Exercise Prescription Goal: Starting with aerobic activity 30 plus minutes a day, 3 days per week for initial exercise prescription. Provide home exercise prescription and guidelines that participant acknowledges understanding prior to discharge.  Activity Barriers & Risk Stratification:     Activity Barriers & Cardiac Risk Stratification - 11/22/15 0822      Activity Barriers & Cardiac Risk Stratification   Activity Barriers Back Problems;Other (comment)   Comments R upper arm pain    Cardiac Risk Stratification High      6 Minute Walk:     6 Minute Walk    Row Name 08/28/15 1705 11/22/15 1228       6 Minute Walk   Phase Initial Initial    Distance 1610 feet 1328 feet    Walk Time 6 minutes 6 minutes    # of Rest Breaks 0 0    MPH 3.05 2.52    METS 4.13 3.3    RPE 11 11    VO2 Peak 14.45 11.56    Symptoms Yes (comment) No    Comments Bilateral ankle soreness secondary to RA and bone spurs.  -    Resting HR 91 bpm 67 bpm    Resting BP 138/88 124/88    Max Ex. HR 114 bpm 94 bpm    Max Ex. BP 144/90 132/90    2 Minute Post BP 132/90 134/80       Initial Exercise Prescription:     Initial  Exercise Prescription - 11/22/15 1200      Date of Initial Exercise RX  and Referring Provider   Date 11/22/15   Referring Provider Martinique, Peter MD     Bike   Level --   Minutes --   METs --     Recumbant Bike   Level 2   Minutes 10   METs 2     NuStep   Level 3   Minutes 10   METs 2     Track   Laps 10   Minutes 10   METs 2.74     Prescription Details   Frequency (times per week) 3   Duration Progress to 30 minutes of continuous aerobic without signs/symptoms of physical distress     Intensity   THRR 40-80% of Max Heartrate 65-131   Ratings of Perceived Exertion 11-13   Perceived Dyspnea 0-4     Progression   Progression Continue to progress workloads to maintain intensity without signs/symptoms of physical distress.     Resistance Training   Training Prescription Yes   Weight 2 lbs   Reps 10-12      Perform Capillary Blood Glucose checks as needed.  Exercise Prescription Changes:     Exercise Prescription Changes    Row Name 09/17/15 1500 11/29/15 1100 12/19/15 1600 01/22/16 0900       Exercise Review   Progression Yes Yes  -  -      Response to Exercise   Blood Pressure (Admit) 132/80 128/90 128/84 158/96    Blood Pressure (Exercise) 128/90 142/80 122/80 174/98    Blood Pressure (Exit) 120/60 138/84 116/74 134/86    Heart Rate (Admit)  - 82 bpm 78 bpm 86 bpm    Heart Rate (Exercise) 99 bpm 103 bpm 95 bpm 102 bpm    Heart Rate (Exit) 136 bpm 83 bpm 74 bpm 79 bpm    Oxygen Saturation (Admit) 99 %  -  -  -    Rating of Perceived Exertion (Exercise) _0 Comments  -  - Reviewed on 12/19/15 Reviewed on 12/19/15    Duration Progress to 30 minutes of continuous aerobic without signs/symptoms of physical distress Progress to 30 minutes of continuous aerobic without signs/symptoms of physical distress Progress to 30 minutes of continuous aerobic without signs/symptoms of physical distress Progress to 30 minutes of continuous aerobic without  signs/symptoms of physical distress    Intensity THRR unchanged THRR unchanged THRR unchanged THRR unchanged      Progression   Progression Continue to progress workloads to maintain intensity without signs/symptoms of physical distress. Continue to progress workloads to maintain intensity without signs/symptoms of physical distress. Continue to progress workloads to maintain intensity without signs/symptoms of physical distress. Continue to progress workloads to maintain intensity without signs/symptoms of physical distress.    Average METs 2.5 2.6 2.2 2.6      Resistance Training   Training Prescription Yes Yes Yes Yes    Weight 3LBS 2 lbs 2 lbs 2lbs    Reps 10-12 10-12 10-12 10-12      Treadmill   MPH -  -  -  -    Grade -  -  -  -    Minutes -  -  -  -    METs -  -  -  -      Bike   Level 1.2  -  -  -    Minutes 10  -  -  -    METs 3.38  -  -  -  Recumbant Bike   Level  - 2 2.5 2.5    Minutes  - _0 METs  - 2 2 2.1      NuStep   Level _1 Minutes _2 METs 2 2.1 1.7 2.6      Track   Laps _3 Minutes _4 METs 2.92 3.26 3.09 3.09      Home Exercise Plan   Plans to continue exercise at  -  - Home  Reviewed HEP on 12/19/15. See progress note Home  Reviewed HEP on 12/19/15. See progress note    Frequency  -  - Add 2 additional days to program exercise sessions. Add 2 additional days to program exercise sessions.       Exercise Comments:     Exercise Comments    Row Name 11/29/15 1121 12/19/15 1641 01/22/16 0946       Exercise Comments Pt is tolerating exercise well; there are no changes to current Ex Rx. Will continue to monitor pt progress with activity/exercisr Reviewed METs and goals. Pt is tolerating exercise well; will continue to monitor exercise progression. Reviewed METs and goals. Pt is tolerating exercise well; will continue to monitor exercise progression.        Discharge Exercise Prescription (Final  Exercise Prescription Changes):     Exercise Prescription Changes - 01/22/16 0900      Response to Exercise   Blood Pressure (Admit) 158/96   Blood Pressure (Exercise) 174/98   Blood Pressure (Exit) 134/86   Heart Rate (Admit) 86 bpm   Heart Rate (Exercise) 102 bpm   Heart Rate (Exit) 79 bpm   Rating of Perceived Exertion (Exercise) 13   Comments Reviewed on 12/19/15   Duration Progress to 30 minutes of continuous aerobic without signs/symptoms of physical distress   Intensity THRR unchanged     Progression   Progression Continue to progress workloads to maintain intensity without signs/symptoms of physical distress.   Average METs 2.6     Resistance Training   Training Prescription Yes   Weight 2lbs   Reps 10-12     Recumbant Bike   Level 2.5   Minutes 10   METs 2.1     NuStep   Level 3   Minutes 10   METs 2.6     Track   Laps 12   Minutes 10   METs 3.09     Home Exercise Plan   Plans to continue exercise at [place] on 12/19/15. See progress note   Frequency Add 2 additional days to program exercise sessions.      Nutrition:  Target Goals: Understanding of nutrition guidelines, daily intake of sodium <1565m, cholesterol <209m calories 30% from fat and 7% or less from saturated fats, daily to have 5 or more servings of fruits and vegetables.  Biometrics:     Pre Biometrics - 11/22/15 1336      Pre Biometrics   Waist Circumference 44 inches   Hip Circumference 47.75 inches   Waist to Hip Ratio 0.92 %   Triceps Skinfold 45 mm   % Body Fat 35.7 %   Grip Strength 32.5 kg   Flexibility 8 in   Single Leg Stand 4.8 seconds       Nutrition Therapy Plan and Nutrition Goals:     Nutrition Therapy & Goals - 09/14/15  1456      Nutrition Therapy   Diet Carb Modified, Therapeutic Lifestyle Changes     Personal Nutrition Goals   Personal Goal #1 1-2 lb wt loss per week for a wt loss goal of 6-24 lb   Personal Goal #2 Improved glycemic  control as evidenced by a decrease in pt's HbA1c with an ultimate goal of < 7.0     Intervention Plan   Intervention Prescribe, educate and counsel regarding individualized specific dietary modifications aiming towards targeted core components such as weight, hypertension, lipid management, diabetes, heart failure and other comorbidities.;Nutrition handout(s) given to patient.  1500 kcal, 5 day menu ideas   Expected Outcomes Short Term Goal: Understand basic principles of dietary content, such as calories, fat, sodium, cholesterol and nutrients.;Long Term Goal: Adherence to prescribed nutrition plan.      Nutrition Discharge: Nutrition Scores:     Nutrition Assessments - 11/23/15 1359      MEDFICTS Scores   Pre Score 6      Nutrition Goals Re-Evaluation:     Nutrition Goals Re-Evaluation    Row Name 12/17/15 0920             Personal Goal #1 Re-Evaluation   Personal Goal #1 1-2 lb wt loss per week for a wt loss goal of 6-24 lb       Goal Progress Seen No       Comments Pt wt is up 1.8 kg.         Personal Goal #2 Re-Evaluation   Personal Goal #2 Improved glycemic control as evidenced by a decrease in pt's HbA1c with an ultimate goal of < 7.0       Goal Progress Seen Yes       Comments Per pt, last A1c 11/2015 was 7.6         Intervention Plan   Intervention Continue to educate, counsel and set short/long term goals regarding individualized specific personal dietary modifications.          Psychosocial: Target Goals: Acknowledge presence or absence of depression, maximize coping skills, provide positive support system. Participant is able to verbalize types and ability to use techniques and skills needed for reducing stress and depression.  Initial Review & Psychosocial Screening:     Initial Psych Review & Screening - 11/30/15 1154      Initial Review   Current issues with History of Depression     Family Dynamics   Good Support System? No   Concerns Recent  loss of significant other;Inappropriate over/under dependence on family/friends   Comments pt overdependent on adult son.  pt husband passes away one year ago.       Barriers   Psychosocial barriers to participate in program The patient should benefit from training in stress management and relaxation.     Screening Interventions   Interventions Encouraged to exercise      Quality of Life Scores:     Quality of Life - 12/12/15 1721      Quality of Life Scores   Socioeconomic Pre --  pt presently lives with her son.  there are financial barriers iin the home.  difficulty affording food and utilities.  pt is being followed by [facility] CSW   GLOBAL Pre --  pt has altered grief pattern from death of spouse 1 year ago. pt suffers from depression, stress and anxiety. pt has been referred to [place] for counseling.       PHQ-9: Recent Review Flowsheet Data  Depression screen [facility] 2/9 12/24/2015 11/29/2015 11/28/2015 09/14/2015 09/03/2015   Decreased Interest 0 0 0 0 0   Down, Depressed, Hopeless 0 0 0 1 1   PHQ - 2 Score 0 0 0 1 1   Altered sleeping 1 - - - -   Tired, decreased energy 1 - - - -   Change in appetite 1 - - - -   Feeling bad or failure about yourself  0 - - - -   Trouble concentrating 0 - - - -   Moving slowly or fidgety/restless 0 - - - -   Suicidal thoughts 0 - - - -   PHQ-9 Score 3 - - - -   Difficult doing work/chores Somewhat difficult - - - -      Psychosocial Evaluation and Intervention:     Psychosocial Evaluation - 12/26/15 1009      Psychosocial Evaluation & Interventions   Interventions Stress management education;Relaxation education;Encouraged to exercise with the program and follow exercise prescription;Therapist referral   Comments pt demonstrates significant situational stress with poor coping skills, pt referred to [facility]. 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 pt has  submitted [employer] application and has met with her school advisor to make plans for academic success    Continued Psychosocial Services Needed Yes      Psychosocial Re-Evaluation:     Psychosocial Re-Evaluation    [place] Name 12/07/15 0640 12/26/15 1015 01/22/16 0921         Psychosocial Re-Evaluation   Interventions Therapist referral;Physician referral;Stress management education;Relaxation education;Encouraged to attend Cardiac Rehabilitation for the exercise Therapist referral;Stress management education;Relaxation education;Physician referral;Encouraged to attend Cardiac Rehabilitation for the exercise Encouraged to attend Cardiac Rehabilitation for the exercise;Stress management education;Relaxation education     Comments pt has significant depression, situational stress and unresolved grief pattern.    - pt depression symptoms somewhat improving. pt continues to not be accepting of offers for physician or therapy referrals to help manage symptoms.  pt is learning to manage her psychosocial barriers such as family situational stress and transportation. pt has successfully applied for [employer] transportation and is looking into resources to live independently.       Continued Psychosocial Services Needed Yes Yes Yes        Vocational Rehabilitation: Provide vocational rehab assistance to qualifying candidates.   Vocational Rehab Evaluation & Intervention:     Vocational Rehab - 08/28/15 1650      Initial Vocational Rehab Evaluation & Intervention   Assessment shows need for Vocational Rehabilitation No      Education: Education Goals: Education classes will be provided on a weekly basis, covering required topics. Participant will state understanding/return demonstration of topics presented.  Learning Barriers/Preferences:     Learning Barriers/Preferences - 11/22/15 0827      Learning Barriers/Preferences   Learning Barriers Sight   Learning Preferences Skilled  Demonstration;Verbal Instruction      Education Topics: Count Your Pulse:  -Group instruction provided by verbal instruction, demonstration, patient participation and written materials to support subject.  Instructors address importance of being able to find your pulse and how to count your pulse when at home without a heart monitor.  Patients get hands on experience counting their pulse with staff help and individually.   Heart Attack, Angina, and Risk Factor Modification:  -Group instruction provided by verbal instruction, video, and written materials to support subject.  Instructors address signs and symptoms of angina and heart  attacks.    Also discuss risk factors for heart disease and how to make changes to improve heart health risk factors. Flowsheet Row CARDIAC REHAB PHASE II EXERCISE from 01/16/2016 in [place]  Date  01/02/16  Instruction Review Code  2- meets goals/outcomes      Functional Fitness:  -Group instruction provided by verbal instruction, demonstration, patient participation, and written materials to support subject.  Instructors address safety measures for doing things around the house.  Discuss how to get up and down off the floor, how to pick things up properly, how to safely get out of a chair without assistance, and balance training.   Meditation and Mindfulness:  -Group instruction provided by verbal instruction, patient participation, and written materials to support subject.  Instructor addresses importance of mindfulness and meditation practice to help reduce stress and improve awareness.  Instructor also leads participants through a meditation exercise.    Stretching for Flexibility and Mobility:  -Group instruction provided by verbal instruction, patient participation, and written materials to support subject.  Instructors lead participants through series of stretches that are designed to increase flexibility thus improving  mobility.  These stretches are additional exercise for major muscle groups that are typically performed during regular warm up and cool down. Flowsheet Row CARDIAC REHAB PHASE II EXERCISE from 01/16/2016 in [place]  Date  09/05/15  Educator  Luetta Nutting Iline Oven  Instruction Review Code  2- meets goals/outcomes      Hands Only CPR Anytime:  -Group instruction provided by verbal instruction, video, patient participation and written materials to support subject.  Instructors co-teach with AHA video for hands only CPR.  Participants get hands on experience with mannequins.   Nutrition I class: Heart Healthy Eating:  -Group instruction provided by PowerPoint slides, verbal discussion, and written materials to support subject matter. The instructor gives an explanation and review of the Therapeutic Lifestyle Changes diet recommendations, which includes a discussion on lipid goals, dietary fat, sodium, fiber, plant stanol/sterol esters, sugar, and the components of a well-balanced, healthy diet. Flowsheet Row CARDIAC REHAB PHASE II EXERCISE from 01/16/2016 in [place]  Date  09/14/15  Educator  RD  Instruction Review Code  Not applicable [class handout given]      Nutrition II class: Lifestyle Skills:  -Group instruction provided by PowerPoint slides, verbal discussion, and written materials to support subject matter. The instructor gives an explanation and review of label reading, grocery shopping for heart health, heart healthy recipe modifications, and ways to make healthier choices when eating out. Flowsheet Row CARDIAC REHAB PHASE II EXERCISE from 01/16/2016 in [place]  Date  09/14/15  Educator  RD  Instruction Review Code  Not applicable [class handout given]      Diabetes Question & Answer:  -Group instruction provided by PowerPoint slides, verbal discussion, and written materials  to support subject matter. The instructor gives an explanation and review of diabetes co-morbidities, pre- and post-prandial blood glucose goals, pre-exercise blood glucose goals, signs, symptoms, and treatment of hypoglycemia and hyperglycemia, and foot care basics. Flowsheet Row CARDIAC REHAB PHASE II EXERCISE from 01/16/2016 in [place]  Date  12/28/15  Educator  RD  Instruction Review Code  2- meets goals/outcomes [class handout given]      Diabetes Blitz:  -Group instruction provided by PowerPoint slides, verbal discussion, and written materials to support subject matter. The instructor gives  an explanation and review of the physiology behind type 1 and type 2 diabetes, diabetes medications and rational behind using different medications, pre- and post-prandial blood glucose recommendations and Hemoglobin A1c goals, diabetes diet, and exercise including blood glucose guidelines for exercising safely.  Flowsheet Row CARDIAC REHAB PHASE II EXERCISE from 01/16/2016 in [place]  Date  09/14/15  Educator  RD  Instruction Review Code  Not applicable [class handout given]      Portion Distortion:  -Group instruction provided by PowerPoint slides, verbal discussion, written materials, and food models to support subject matter. The instructor gives an explanation of serving size versus portion size, changes in portions sizes over the last 20 years, and what consists of a serving from each food group. Flowsheet Row CARDIAC REHAB PHASE II EXERCISE from 01/16/2016 in [place]  Date  12/19/15  Educator  RD  Instruction Review Code  2- meets goals/outcomes      Stress Management:  -Group instruction provided by verbal instruction, video, and written materials to support subject matter.  Instructors review role of stress in heart disease and how to cope with stress positively.   Flowsheet Row CARDIAC  REHAB PHASE II EXERCISE from 01/16/2016 in [place]  Date  12/12/15  Instruction Review Code  2- meets goals/outcomes      Exercising on Your Own:  -Group instruction provided by verbal instruction, power point, and written materials to support subject.  Instructors discuss benefits of exercise, components of exercise, frequency and intensity of exercise, and end points for exercise.  Also discuss use of nitroglycerin and activating EMS.  Review options of places to exercise outside of rehab.  Review guidelines for sex with heart disease. Flowsheet Row CARDIAC REHAB PHASE II EXERCISE from 01/16/2016 in [place]  Date  12/05/15  Instruction Review Code  2- meets goals/outcomes      Cardiac Drugs I:  -Group instruction provided by verbal instruction and written materials to support subject.  Instructor reviews cardiac drug classes: antiplatelets, anticoagulants, beta blockers, and statins.  Instructor discusses reasons, side effects, and lifestyle considerations for each drug class.   Cardiac Drugs II:  -Group instruction provided by verbal instruction and written materials to support subject.  Instructor reviews cardiac drug classes: angiotensin converting enzyme inhibitors (ACE-I), angiotensin II receptor blockers (ARBs), nitrates, and calcium channel blockers.  Instructor discusses reasons, side effects, and lifestyle considerations for each drug class. Flowsheet Row CARDIAC REHAB PHASE II EXERCISE from 01/16/2016 in [place]  Date  11/28/15  Educator  pharmacy  Instruction Review Code  2- meets goals/outcomes      Anatomy and Physiology of the Circulatory System:  -Group instruction provided by verbal instruction, video, and written materials to support subject.  Reviews functional anatomy of heart, how it relates to various diagnoses, and what role the heart plays in the overall  system. Flowsheet Row CARDIAC REHAB PHASE II EXERCISE from 01/16/2016 in [facility]  Date  01/16/16  Instruction Review Code  2- meets goals/outcomes      Knowledge Questionnaire Score:     Knowledge Questionnaire Score - 11/23/15 1359      Knowledge Questionnaire Score   Pre Score     DM 13/15      Core Components/Risk Factors/Patient Goals at Admission:     Personal Goals and Risk Factors at Admission - 11/22/15 2992  Core Components/Risk Factors/Patient Goals on Admission    Weight Management Obesity;Weight Loss;Yes   Intervention Weight Management: Develop a combined nutrition and exercise program designed to reach desired caloric intake, while maintaining appropriate intake of nutrient and fiber, sodium and fats, and appropriate energy expenditure required for the weight goal.;Weight Management: Provide education and appropriate resources to help participant work on and attain dietary goals.;Weight Management/Obesity: Establish reasonable short term and long term weight goals.;Obesity: Provide education and appropriate resources to help participant work on and attain dietary goals.   Expected Outcomes Long Term: Adherence to nutrition and physical activity/exercise program aimed toward attainment of established weight goal;Short Term: Continue to assess and modify interventions until short term weight is achieved;Weight Maintenance: Understanding of the daily nutrition guidelines, which includes 25-35% calories from fat, 7% or less cal from saturated fats, less than 274m cholesterol, less than 1.5gm of sodium, & 5 or more servings of fruits and vegetables daily;Weight Loss: Understanding of general recommendations for a balanced deficit meal plan, which promotes 1-2 lb weight loss per week and includes a negative energy balance of [phone] kcal/d;Understanding recommendations for meals to include 15-35% energy as protein, 25-35% energy from fat,  35-60% energy from carbohydrates, less than 2033mof dietary cholesterol, 20-35 gm of total fiber daily;Understanding of distribution of calorie intake throughout the day with the consumption of 4-5 meals/snacks   Increase Strength and Stamina Yes   Intervention Provide advice, education, support and counseling about physical activity/exercise needs.;Develop an individualized exercise prescription for aerobic and resistive training based on initial evaluation findings, risk stratification, comorbidities and participant's personal goals.   Expected Outcomes Achievement of increased cardiorespiratory fitness and enhanced flexibility, muscular endurance and strength shown through measurements of functional capacity and personal statement of participant.   Improve shortness of breath with ADL's Yes   Intervention Provide education, individualized exercise plan and daily activity instruction to help decrease symptoms of SOB with activities of daily living.   Expected Outcomes Short Term: Achieves a reduction of symptoms when performing activities of daily living.   Diabetes Yes   Intervention Provide education about signs/symptoms and action to take for hypo/hyperglycemia.;Provide education about proper nutrition, including hydration, and aerobic/resistive exercise prescription along with prescribed medications to achieve blood glucose in normal ranges: Fasting glucose 65-99 mg/dL   Expected Outcomes Short Term: Participant verbalizes understanding of the signs/symptoms and immediate care of hyper/hypoglycemia, proper foot care and importance of medication, aerobic/resistive exercise and nutrition plan for blood glucose control.;Long Term: Attainment of HbA1C < 7%.   Hypertension Yes   Intervention Provide education on lifestyle modifcations including regular physical activity/exercise, weight management, moderate sodium restriction and increased consumption of fresh fruit, vegetables, and low fat dairy,  alcohol moderation, and smoking cessation.;Monitor prescription use compliance.   Expected Outcomes Short Term: Continued assessment and intervention until BP is < 140/9049mG in hypertensive participants. < 130/21m41m in hypertensive participants with diabetes, heart failure or chronic kidney disease.;Long Term: Maintenance of blood pressure at goal levels.   Lipids Yes   Intervention Provide education and support for participant on nutrition & aerobic/resistive exercise along with prescribed medications to achieve LDL <70mg30mL >40mg.57mxpected Outcomes Short Term: Participant states understanding of desired cholesterol values and is compliant with medications prescribed. Participant is following exercise prescription and nutrition guidelines.;Long Term: Cholesterol controlled with medications as prescribed, with individualized exercise RX and with personalized nutrition plan. Value goals: LDL < 70mg, 42m> 40 mg.   Stress Yes  Intervention Offer individual and/or small group education and counseling on adjustment to heart disease, stress management and health-related lifestyle change. Teach and support self-help strategies.;Refer participants experiencing significant psychosocial distress to appropriate mental health specialists for further evaluation and treatment. When possible, include family members and significant others in education/counseling sessions.   Expected Outcomes Short Term: Participant demonstrates changes in health-related behavior, relaxation and other stress management skills, ability to obtain effective social support, and compliance with psychotropic medications if prescribed.;Long Term: Emotional wellbeing is indicated by absence of clinically significant psychosocial distress or social isolation.   Personal Goal Other Yes   Personal Goal To "get some me time" and focus on self. Gain independence and get life back   Intervention Provide stress management classes and coping  mechanisms to assist with improving self assurance and self confidence   Expected Outcomes Pt will be able to live independently, get life back and focus on self      Core Components/Risk Factors/Patient Goals Review:      Goals and Risk Factor Review    Row Name 12/19/15 1642 01/22/16 0946           Core Components/Risk Factors/Patient Goals Review   Personal Goals Review Other Other;Stress;Weight Management/Obesity      Review Pt struggles to get "me time"; lives in a high stressfull situation. Discussed coping mechanisms for stress and different ways to distress and focus on self.  Pt is not eating and has many stressors that has caused her to gain weight.      Expected Outcomes Pt will learn better coping mechanisms for stress and take better care of self. Pt will learn better coping mechanisms for stress and take better care of self.         Core Components/Risk Factors/Patient Goals at Discharge (Final Review):      Goals and Risk Factor Review - 01/22/16 0946      Core Components/Risk Factors/Patient Goals Review   Personal Goals Review Other;Stress;Weight Management/Obesity   Review Pt is not eating and has many stressors that has caused her to gain weight.   Expected Outcomes Pt will learn better coping mechanisms for stress and take better care of self.      ITP Comments:     ITP Comments    Row Name 08/28/15 1642 11/22/15 0819 12/21/15 1351       ITP Comments Dr. Fransico Him, MD Medical Director  Dr. Fransico Him, MD Medical Director  Patient attended the "Taking the high out of high blood pressure" video/lecture education class on 12/21/15. Met outcomes/goals.        Comments: Pt is making expected progress toward personal goals after completing 16 sessions. Recommend continued exercise and life style modification education including  stress management and relaxation techniques to decrease cardiac risk profile.

## 2016-01-25 ENCOUNTER — Encounter (HOSPITAL_COMMUNITY): Payer: Commercial Managed Care - HMO

## 2016-01-28 ENCOUNTER — Encounter (HOSPITAL_COMMUNITY): Payer: Commercial Managed Care - HMO

## 2016-01-29 DIAGNOSIS — Z780 Asymptomatic menopausal state: Secondary | ICD-10-CM | POA: Diagnosis not present

## 2016-01-30 ENCOUNTER — Encounter (HOSPITAL_COMMUNITY)
Admission: RE | Admit: 2016-01-30 | Discharge: 2016-01-30 | Disposition: A | Discharge status: Home / Self Care | Payer: Commercial Managed Care - HMO | Source: Ambulatory Visit | Attending: Cardiology | Admitting: Cardiology

## 2016-01-30 DIAGNOSIS — Z79899 Other long term (current) drug therapy: Secondary | ICD-10-CM | POA: Diagnosis not present

## 2016-01-30 DIAGNOSIS — Z951 Presence of aortocoronary bypass graft: Secondary | ICD-10-CM | POA: Diagnosis not present

## 2016-01-30 DIAGNOSIS — I2111 ST elevation (STEMI) myocardial infarction involving right coronary artery: Secondary | ICD-10-CM

## 2016-01-30 DIAGNOSIS — I214 Non-ST elevation (NSTEMI) myocardial infarction: Secondary | ICD-10-CM | POA: Diagnosis not present

## 2016-01-30 LAB — GLUCOSE, CAPILLARY: Glucose-Capillary: 135 mg/dL — ABNORMAL HIGH (ref 65–99)

## 2016-02-01 ENCOUNTER — Encounter (HOSPITAL_COMMUNITY)
Admission: RE | Admit: 2016-02-01 | Discharge: 2016-02-01 | Disposition: A | Discharge status: Home / Self Care | Payer: Commercial Managed Care - HMO | Source: Ambulatory Visit | Attending: Cardiology | Admitting: Cardiology

## 2016-02-01 DIAGNOSIS — I214 Non-ST elevation (NSTEMI) myocardial infarction: Secondary | ICD-10-CM | POA: Diagnosis not present

## 2016-02-01 DIAGNOSIS — I2111 ST elevation (STEMI) myocardial infarction involving right coronary artery: Secondary | ICD-10-CM

## 2016-02-01 DIAGNOSIS — Z79899 Other long term (current) drug therapy: Secondary | ICD-10-CM | POA: Diagnosis not present

## 2016-02-01 DIAGNOSIS — Z951 Presence of aortocoronary bypass graft: Secondary | ICD-10-CM | POA: Diagnosis not present

## 2016-02-01 LAB — GLUCOSE, CAPILLARY: Glucose-Capillary: 127 mg/dL — ABNORMAL HIGH (ref 65–99)

## 2016-02-04 ENCOUNTER — Encounter (HOSPITAL_COMMUNITY)
Admission: RE | Admit: 2016-02-04 | Discharge: 2016-02-04 | Disposition: A | Discharge status: Home / Self Care | Payer: Commercial Managed Care - HMO | Source: Ambulatory Visit | Attending: Cardiology | Admitting: Cardiology

## 2016-02-04 DIAGNOSIS — Z951 Presence of aortocoronary bypass graft: Secondary | ICD-10-CM | POA: Diagnosis not present

## 2016-02-04 DIAGNOSIS — Z79899 Other long term (current) drug therapy: Secondary | ICD-10-CM | POA: Diagnosis not present

## 2016-02-04 DIAGNOSIS — I214 Non-ST elevation (NSTEMI) myocardial infarction: Secondary | ICD-10-CM | POA: Diagnosis not present

## 2016-02-04 DIAGNOSIS — I2111 ST elevation (STEMI) myocardial infarction involving right coronary artery: Secondary | ICD-10-CM

## 2016-02-04 LAB — GLUCOSE, CAPILLARY: Glucose-Capillary: 123 mg/dL — ABNORMAL HIGH (ref 65–99)

## 2016-02-06 ENCOUNTER — Encounter (HOSPITAL_COMMUNITY)
Admission: RE | Admit: 2016-02-06 | Discharge: 2016-02-06 | Disposition: A | Discharge status: Home / Self Care | Payer: Commercial Managed Care - HMO | Source: Ambulatory Visit | Attending: Cardiology | Admitting: Cardiology

## 2016-02-06 DIAGNOSIS — Z951 Presence of aortocoronary bypass graft: Secondary | ICD-10-CM | POA: Diagnosis not present

## 2016-02-06 DIAGNOSIS — I2111 ST elevation (STEMI) myocardial infarction involving right coronary artery: Secondary | ICD-10-CM

## 2016-02-06 DIAGNOSIS — I214 Non-ST elevation (NSTEMI) myocardial infarction: Secondary | ICD-10-CM | POA: Diagnosis not present

## 2016-02-06 DIAGNOSIS — Z79899 Other long term (current) drug therapy: Secondary | ICD-10-CM | POA: Diagnosis not present

## 2016-02-06 LAB — GLUCOSE, CAPILLARY: Glucose-Capillary: 126 mg/dL — ABNORMAL HIGH (ref 65–99)

## 2016-02-08 ENCOUNTER — Emergency Department (HOSPITAL_COMMUNITY)
Admission: EM | Admit: 2016-02-08 | Discharge: 2016-02-08 | Disposition: A | Discharge status: Home / Self Care | Payer: Commercial Managed Care - HMO | Attending: Emergency Medicine | Admitting: Emergency Medicine

## 2016-02-08 ENCOUNTER — Encounter (HOSPITAL_COMMUNITY): Payer: Commercial Managed Care - HMO

## 2016-02-08 ENCOUNTER — Encounter (HOSPITAL_COMMUNITY): Payer: Self-pay | Admitting: Nurse Practitioner

## 2016-02-08 DIAGNOSIS — Y9389 Activity, other specified: Secondary | ICD-10-CM | POA: Insufficient documentation

## 2016-02-08 DIAGNOSIS — Z23 Encounter for immunization: Secondary | ICD-10-CM | POA: Insufficient documentation

## 2016-02-08 DIAGNOSIS — Z955 Presence of coronary angioplasty implant and graft: Secondary | ICD-10-CM | POA: Insufficient documentation

## 2016-02-08 DIAGNOSIS — Y929 Unspecified place or not applicable: Secondary | ICD-10-CM | POA: Diagnosis not present

## 2016-02-08 DIAGNOSIS — Z8673 Personal history of transient ischemic attack (TIA), and cerebral infarction without residual deficits: Secondary | ICD-10-CM | POA: Insufficient documentation

## 2016-02-08 DIAGNOSIS — W260XXA Contact with knife, initial encounter: Secondary | ICD-10-CM | POA: Diagnosis not present

## 2016-02-08 DIAGNOSIS — I2510 Atherosclerotic heart disease of native coronary artery without angina pectoris: Secondary | ICD-10-CM | POA: Diagnosis not present

## 2016-02-08 DIAGNOSIS — Z87891 Personal history of nicotine dependence: Secondary | ICD-10-CM | POA: Insufficient documentation

## 2016-02-08 DIAGNOSIS — E119 Type 2 diabetes mellitus without complications: Secondary | ICD-10-CM | POA: Diagnosis not present

## 2016-02-08 DIAGNOSIS — S61214A Laceration without foreign body of right ring finger without damage to nail, initial encounter: Secondary | ICD-10-CM | POA: Diagnosis not present

## 2016-02-08 DIAGNOSIS — Y999 Unspecified external cause status: Secondary | ICD-10-CM | POA: Diagnosis not present

## 2016-02-08 DIAGNOSIS — I10 Essential (primary) hypertension: Secondary | ICD-10-CM | POA: Diagnosis not present

## 2016-02-08 DIAGNOSIS — Z951 Presence of aortocoronary bypass graft: Secondary | ICD-10-CM | POA: Insufficient documentation

## 2016-02-08 DIAGNOSIS — Z7982 Long term (current) use of aspirin: Secondary | ICD-10-CM | POA: Diagnosis not present

## 2016-02-08 DIAGNOSIS — S61219A Laceration without foreign body of unspecified finger without damage to nail, initial encounter: Secondary | ICD-10-CM

## 2016-02-08 DIAGNOSIS — I252 Old myocardial infarction: Secondary | ICD-10-CM | POA: Diagnosis not present

## 2016-02-08 DIAGNOSIS — S6991XA Unspecified injury of right wrist, hand and finger(s), initial encounter: Secondary | ICD-10-CM | POA: Diagnosis present

## 2016-02-08 MED ORDER — LIDOCAINE HCL (PF) 1 % IJ SOLN
5.0000 mL | Freq: Once | INTRAMUSCULAR | Status: AC
  Administered 2016-02-08: 5 mL
  Filled 2016-02-08: qty 5

## 2016-02-08 MED ORDER — TETANUS-DIPHTH-ACELL PERTUSSIS 5-2.5-18.5 LF-MCG/0.5 IM SUSP
0.5000 mL | Freq: Once | INTRAMUSCULAR | Status: AC
  Administered 2016-02-08: 0.5 mL via INTRAMUSCULAR
  Filled 2016-02-08: qty 0.5

## 2016-02-08 NOTE — ED Triage Notes (Signed)
Pt presents with c/o finger laceration. She cut the tip of her R 4th digit on a knife while washing dishes tonight. Moderate pain at the site, oozing blood.

## 2016-02-08 NOTE — ED Notes (Signed)
Pt verbalized understanding discharge instructions and denies any further needs or questions at this time. VS stable, ambulatory and steady gait.   

## 2016-02-08 NOTE — ED Provider Notes (Signed)
[facility] DEPT Provider Note   CSN: [id number] Arrival date & time: 02/08/16  1740   By signing my name below, I, Neta Mends, attest that this documentation has been prepared under the direction and in the presence of Debroah Baller, NP. Electronically Signed: Neta Mends, ED Scribe. 02/08/2016. 6:16 PM.   History   Chief Complaint Chief Complaint  Patient presents with  . Laceration     The history is provided by the patient. No language interpreter was used.  Laceration   The incident occurred 1 to 2 hours ago. The laceration is located on the right hand. The laceration is 1 cm in size. The laceration mechanism was a a metal edge. The pain is moderate. The pain has been constant since onset. Her tetanus status is out of date.   HPI Comments:  [name] [name] is a 55 y.o. female with PMHx of DM and MI who presents to the Emergency Department complaining of a laceration that she sustained PTA. Pt reports that she cut the tip of her right 4th finger on a knife while washing dishes in the sink. Pt complains of moderate pain to the area. Tetanus in not UTD. Pt takes anticoagulates due to a previous MI. Pt has been able to partially control the bleeding. No other alleviating factors noted. Pt denies other associated symptoms.  Past Medical History:  Diagnosis Date  . Acute ST elevation myocardial infarction (STEMI) involving right coronary artery ([place]) 10/04/2015  . Anxiety   . Anxiety and depression   . CAD (coronary artery disease)    a. CABG 06/2015: LIMA-LAD, SVG-RCA, SVG-OM2 b. STEMI s/p PCI to distal RCA lesion with a small Promus Premier stent. ( patent LIMA to the LAD, occluded SVG to OM, occluded SVG to RCA.)  . CAD (coronary artery disease) of artery bypass graft; occluded SVG to RCA and occluded SVG to OM 10/08/2015  . Empty sella ([place])   . Hidradenitis axillaris   . Hyperlipemia   . Hypertension   . Kidney stones   . Lupus dx'd 2008   "they think I have  this; have to get retested" (06/12/2015)  . Obesity (BMI 30.0-34.9)   . Ovarian cyst   . Pneumonia X 2  . PONV (postoperative nausea and vomiting)   . Rheumatoid aortitis   . Rheumatoid arthritis ([place])   . S/P angioplasty with stent; 10/04/15 to distal RCA lesion. with Promus premier. 10/08/2015  . Scoliosis   . Stroke [facility]) March 2014   left MCA branches; "they say I have them all the time", denies residual on 06/12/2015  . Tobacco abuse   . Type II diabetes mellitus [facility])     Patient Active Problem List   Diagnosis Date Noted  . CAD (coronary artery disease)   . CAD (coronary artery disease) of artery bypass graft; occluded SVG to RCA and occluded SVG to OM 10/08/2015  . S/P angioplasty with stent; 10/04/15 to distal RCA lesion. with Promus premier. 10/08/2015  . Acute ST elevation myocardial infarction (STEMI) involving right coronary artery ([place]) 10/04/2015  . Acute ST elevation myocardial infarction (STEMI) ([place]) 10/04/2015  . Acute MI, inferior wall, initial episode of care ([place])   . Superficial incisional surgical site infection, medial left lower extremity 07/10/2015  . S/P CABG x 3 06/19/2015  . Abnormal stress test 06/15/2015  . Coronary artery disease involving native coronary artery of native heart without angina pectoris   . Pain   . NSTEMI (non-ST elevated myocardial infarction) (  [place]) 06/13/2015  . Dyslipidemia-LDL 197, goal < 70 06/13/2015  . Pain in the chest   . Diabetes mellitus without complication ([place])   . Chest pain 06/12/2015  . Tobacco abuse   . Type 2 diabetes mellitus, uncontrolled ([place]) 06/04/2012  . Lupus ([place]) 06/03/2012  . History of stroke 06/03/2012  . Empty sella syndrome ([place]) 06/03/2012  . HTN (hypertension) 06/03/2012  . Rheumatoid arthritis ([place]) 06/03/2012  . Hidradenitis axillaris 06/03/2012  . Obesity (BMI 30.0-34.9)     Past Surgical History:  Procedure Laterality Date  . ABDOMINAL HYSTERECTOMY    . BREAST BIOPSY Bilateral   .  CARDIAC CATHETERIZATION N/A 06/13/2015   Procedure: Left Heart Cath and Coronary Angiography;  Surgeon: Peter M Martinique, MD;  Location: [facility] CV LAB;  Service: Cardiovascular;  Laterality: N/A;  . CARDIAC CATHETERIZATION N/A 10/04/2015   Procedure: Left Heart Cath and Coronary Angiography;  Surgeon: [name], MD;  Location: [facility];  Service: Cardiovascular;  Laterality: N/A;  . CARDIAC CATHETERIZATION N/A 10/04/2015   Procedure: Coronary Stent Intervention;  Surgeon: [name], MD;  Location: [facility];  Service: Cardiovascular;  Laterality: N/A;  . CORONARY ARTERY BYPASS GRAFT N/A 06/19/2015   Procedure: CORONARY ARTERY BYPASS GRAFTING (CABG) TIMES FOUR USING LEFT INTERNAL MAMMARY, RIGHT SAPHENOUS LEG VEIN AND CRYO SAPHENOUS VEIN;  Surgeon: [name], MD;  Location: [facility];  Service: Open Heart Surgery;  Laterality: N/A;  . DILATION AND CURETTAGE OF UTERUS    . TEE WITHOUT CARDIOVERSION N/A 06/19/2015   Procedure: TRANSESOPHAGEAL ECHOCARDIOGRAM (TEE);  Surgeon: [name], MD;  Location: [facility];  Service: Open Heart Surgery;  Laterality: N/A;  . TONSILLECTOMY  1960s  . TUBAL LIGATION      OB History    No data available       Home Medications    Prior to Admission medications   Medication Sig Start Date End Date Taking? Authorizing Provider  acetaminophen (TYLENOL) 325 MG tablet Take 2 tablets (650 mg total) by mouth every 4 (four) hours as needed for headache or mild pain. 10/08/15   Isaiah Serge, NP  aspirin EC 81 MG EC tablet Take 1 tablet (81 mg total) by mouth daily. 06/25/15   Donielle Liston Alba, [name]  atorvastatin (LIPITOR) 80 MG tablet TAKE 1 TABLET BY MOUTH DAILY AT 6PM 11/20/15   Peter M Martinique, MD  Biotin 1 MG CAPS Take 2 capsules by mouth.    Historical Provider, MD  Cholecalciferol (VITAMIN D3) 50000 units CAPS Take 50,000 Units by mouth once a week. 04/09/15   Historical Provider, MD  INVOKANA 300 MG TABS tablet Take 300 mg  by mouth daily. 06/05/15   Historical Provider, MD  Iron-Vitamins (GERITOL COMPLETE PO) Take 1 tablet by mouth daily.    Historical Provider, MD  isosorbide mononitrate (IMDUR) 30 MG 24 hr tablet Take 0.5 tablets (15 mg total) by mouth daily. 11/20/15   Peter M Martinique, MD  JANUVIA 100 MG tablet Take 100 mg by mouth daily. 04/06/15   Historical Provider, MD  metFORMIN (GLUCOPHAGE) 1000 MG tablet Take 1,000 mg by mouth 2 (two) times daily. 04/06/15   Historical Provider, MD  Metoprolol Tartrate 37.5 MG TABS Take 37.5 mg by mouth 2 (two) times daily. 11/20/15   Peter M Martinique, MD  nitroGLYCERIN (NITROSTAT) 0.4 MG SL tablet Place 1 tablet (0.4 mg total) under the tongue every 5 (five) minutes as needed for chest pain. MAX 3  doses 11/20/15   Peter M Martinique, MD  ticagrelor (BRILINTA) 90 MG TABS tablet Take 1 tablet (90 mg total) by mouth 2 (two) times daily. 12/17/15   Peter M Martinique, MD  traMADol (ULTRAM) 50 MG tablet Take 1 tablet (50 mg total) by mouth every 6 (six) hours as needed. 09/10/15   Grace Isaac, MD    Family History Family History  Problem Relation Age of Onset  . Diabetes Mother   . Heart failure Mother   . Stroke Maternal Aunt   . Diabetes      maternal  . Breast cancer    . Alzheimer's disease      maternal  . Depression Son     Social History Social History  Substance Use Topics  . Smoking status: Former Smoker    Packs/day: 0.50    Years: 36.00    Types: Cigarettes    Quit date: 06/12/2014  . Smokeless tobacco: Never Used  . Alcohol use No     Allergies   Patient has no known allergies.   Review of Systems Review of Systems  Constitutional: Negative for fever.  Skin: Positive for wound.  all other systems negataive   Physical Exam Updated Vital Signs BP 132/84 (BP Location: Right Arm)   Pulse 74   Temp 98.1 F (36.7 C) (Oral)   Resp 17   SpO2 98%   Physical Exam  Constitutional: She appears well-developed and well-nourished. No distress.  HENT:    Head: Normocephalic and atraumatic.  Eyes: Conjunctivae and EOM are normal.  Neck: Neck supple.  Cardiovascular: Normal rate.   Pulmonary/Chest: Effort normal.  Abdominal: She exhibits no distension.  Musculoskeletal: Normal range of motion.       Right hand: She exhibits tenderness and laceration. Normal sensation noted. Normal strength noted.       Hands: 1 cm laceration to the palmar aspect of the distal aspect of the right ring finger.   Neurological: She is alert.  Skin: Skin is warm and dry.  Psychiatric: She has a normal mood and affect.  Nursing note and vitals reviewed.    ED Treatments / Results  DIAGNOSTIC STUDIES:  Oxygen Saturation is 100% on RA, normal by my interpretation.    COORDINATION OF CARE:  6:16 PM Discussed treatment plan with pt at bedside and pt agreed to plan.   Labs (all labs ordered are listed, but only abnormal results are displayed) Labs Reviewed - No data to display  Radiology No results found.  Procedures Procedures (including critical care time)  Medications Ordered in ED Medications  Tdap (BOOSTRIX) injection 0.5 mL (0.5 mLs Intramuscular Given 02/08/16 1846)  lidocaine (PF) (XYLOCAINE) 1 % injection 5 mL (5 mLs Infiltration Given 02/08/16 1846)     Initial Impression / Assessment and Plan / ED Course  Tetanus updated in ED. Laceration occurred < 12 hours prior to repair. Discussed laceration care with pt and answered questions. She will return for any signs of infection. Pt is hemodynamically stable with no complaints prior to dc.      Cleaned wound and irrigated it with normal saline 12ml, and attempted to place first suture, and patient jerked and stated that she did not need stitches. Will now use dermabond.   7:41 PM Dermabond used for lac repair. Discussed with the patient wound care.   I have reviewed the triage vital signs and the nursing notes.   Clinical Course     Final Clinical Impressions(s) / ED Diagnoses  Final diagnoses:  Laceration of finger, right, initial encounter    New Prescriptions Discharge Medication List as of 02/08/2016  7:40 PM    I personally performed the services described in this documentation, which was scribed in my presence. The recorded information has been reviewed and is accurate.     [place], [place] 02/09/16 2237    Leo Grosser, MD 02/09/16 [phone]

## 2016-02-11 ENCOUNTER — Encounter (HOSPITAL_COMMUNITY): Admission: RE | Admit: 2016-02-11 | Payer: Commercial Managed Care - HMO | Source: Ambulatory Visit

## 2016-02-11 DIAGNOSIS — H66003 Acute suppurative otitis media without spontaneous rupture of ear drum, bilateral: Secondary | ICD-10-CM | POA: Diagnosis not present

## 2016-02-11 DIAGNOSIS — Z7984 Long term (current) use of oral hypoglycemic drugs: Secondary | ICD-10-CM | POA: Diagnosis not present

## 2016-02-11 DIAGNOSIS — E1121 Type 2 diabetes mellitus with diabetic nephropathy: Secondary | ICD-10-CM | POA: Diagnosis not present

## 2016-02-11 DIAGNOSIS — I10 Essential (primary) hypertension: Secondary | ICD-10-CM | POA: Diagnosis not present

## 2016-02-13 ENCOUNTER — Encounter (HOSPITAL_COMMUNITY): Payer: Commercial Managed Care - HMO

## 2016-02-15 ENCOUNTER — Encounter (HOSPITAL_COMMUNITY): Payer: Commercial Managed Care - HMO

## 2016-02-18 ENCOUNTER — Encounter (HOSPITAL_COMMUNITY)
Admission: RE | Admit: 2016-02-18 | Discharge: 2016-02-18 | Disposition: A | Discharge status: Home / Self Care | Payer: Commercial Managed Care - HMO | Source: Ambulatory Visit | Attending: Cardiology | Admitting: Cardiology

## 2016-02-18 DIAGNOSIS — Z951 Presence of aortocoronary bypass graft: Secondary | ICD-10-CM | POA: Diagnosis not present

## 2016-02-18 DIAGNOSIS — Z79899 Other long term (current) drug therapy: Secondary | ICD-10-CM | POA: Insufficient documentation

## 2016-02-18 DIAGNOSIS — I214 Non-ST elevation (NSTEMI) myocardial infarction: Secondary | ICD-10-CM | POA: Insufficient documentation

## 2016-02-18 DIAGNOSIS — I2111 ST elevation (STEMI) myocardial infarction involving right coronary artery: Secondary | ICD-10-CM

## 2016-02-18 LAB — GLUCOSE, CAPILLARY: Glucose-Capillary: 131 mg/dL — ABNORMAL HIGH (ref 65–99)

## 2016-02-18 NOTE — Progress Notes (Signed)
Incomplete Session Note  Patient Details  Name: [name] [name] MRN: [id number] Date of Birth: 1960/08/12 Referring Provider:   Flowsheet Row CARDIAC REHAB PHASE II ORIENTATION from 11/22/2015 in [facility]  Referring Provider  Martinique, Peter MD      [name] did not complete her rehab session.  Pt met with exercise physiologist about her MET and Goals review. Pt c/o emotional distress and depression associated with family situation in her home. Pt declined offer for appt with PCP or counselor to discuss her concerns.  Pt has her niece with her.  Pt is also recovering from recent upper respiratory illness.  Pt plans to return to exercise on next schedule session.

## 2016-02-19 DIAGNOSIS — E1121 Type 2 diabetes mellitus with diabetic nephropathy: Secondary | ICD-10-CM | POA: Diagnosis not present

## 2016-02-19 DIAGNOSIS — E785 Hyperlipidemia, unspecified: Secondary | ICD-10-CM | POA: Diagnosis not present

## 2016-02-19 DIAGNOSIS — I10 Essential (primary) hypertension: Secondary | ICD-10-CM | POA: Diagnosis not present

## 2016-02-19 DIAGNOSIS — Z7984 Long term (current) use of oral hypoglycemic drugs: Secondary | ICD-10-CM | POA: Diagnosis not present

## 2016-02-19 DIAGNOSIS — I2510 Atherosclerotic heart disease of native coronary artery without angina pectoris: Secondary | ICD-10-CM | POA: Diagnosis not present

## 2016-02-20 ENCOUNTER — Encounter (HOSPITAL_COMMUNITY)
Admission: RE | Admit: 2016-02-20 | Discharge: 2016-02-20 | Disposition: A | Discharge status: Home / Self Care | Payer: Commercial Managed Care - HMO | Source: Ambulatory Visit | Attending: Cardiology | Admitting: Cardiology

## 2016-02-20 DIAGNOSIS — Z79899 Other long term (current) drug therapy: Secondary | ICD-10-CM | POA: Diagnosis not present

## 2016-02-20 DIAGNOSIS — Z951 Presence of aortocoronary bypass graft: Secondary | ICD-10-CM

## 2016-02-20 DIAGNOSIS — I214 Non-ST elevation (NSTEMI) myocardial infarction: Secondary | ICD-10-CM | POA: Diagnosis not present

## 2016-02-20 DIAGNOSIS — I2111 ST elevation (STEMI) myocardial infarction involving right coronary artery: Secondary | ICD-10-CM

## 2016-02-20 LAB — GLUCOSE, CAPILLARY: Glucose-Capillary: 194 mg/dL — ABNORMAL HIGH (ref 65–99)

## 2016-02-20 NOTE — Progress Notes (Signed)
Cardiac Individual Treatment Plan  Patient Details  Name: [name] [name] MRN: [id number] Date of Birth: Jun 26, 1960 Referring Provider:   Flowsheet Row CARDIAC REHAB PHASE II ORIENTATION from 11/22/2015 in [place]  Referring Provider  Martinique, Peter MD      Initial Encounter Date:  [facility] from 11/22/2015 in [place]  Date  11/22/15  Referring Provider  Martinique, Peter MD      Visit Diagnosis: 10/11/15 ST elevation myocardial infarction involving right coronary artery (HCC)  S/P CABG (coronary artery bypass graft)  Patient's Home Medications on Admission:  Current Outpatient Prescriptions:  .  acetaminophen (TYLENOL) 325 MG tablet, Take 2 tablets (650 mg total) by mouth every 4 (four) hours as needed for headache or mild pain., Disp: , Rfl:  .  aspirin EC 81 MG EC tablet, Take 1 tablet (81 mg total) by mouth daily., Disp: , Rfl:  .  atorvastatin (LIPITOR) 80 MG tablet, TAKE 1 TABLET BY MOUTH DAILY AT 6PM, Disp: 90 tablet, Rfl: 3 .  Biotin 1 MG CAPS, Take 2 capsules by mouth., Disp: , Rfl:  .  Cholecalciferol (VITAMIN D3) 50000 units CAPS, Take 50,000 Units by mouth once a week., Disp: , Rfl:  .  INVOKANA 300 MG TABS tablet, Take 300 mg by mouth daily., Disp: , Rfl:  .  Iron-Vitamins (GERITOL COMPLETE PO), Take 1 tablet by mouth daily., Disp: , Rfl:  .  isosorbide mononitrate (IMDUR) 30 MG 24 hr tablet, Take 0.5 tablets (15 mg total) by mouth daily., Disp: 45 tablet, Rfl: 3 .  JANUVIA 100 MG tablet, Take 100 mg by mouth daily., Disp: , Rfl:  .  metFORMIN (GLUCOPHAGE) 1000 MG tablet, Take 1,000 mg by mouth 2 (two) times daily., Disp: , Rfl:  .  Metoprolol Tartrate 37.5 MG TABS, Take 37.5 mg by mouth 2 (two) times daily., Disp: 180 tablet, Rfl: 3 .  nitroGLYCERIN (NITROSTAT) 0.4 MG SL tablet, Place 1 tablet (0.4 mg total) under the tongue every 5 (five) minutes as needed for chest  pain. MAX 3 doses, Disp: 25 tablet, Rfl: 1 .  ticagrelor (BRILINTA) 90 MG TABS tablet, Take 1 tablet (90 mg total) by mouth 2 (two) times daily., Disp: 180 tablet, Rfl: 1 .  traMADol (ULTRAM) 50 MG tablet, Take 1 tablet (50 mg total) by mouth every 6 (six) hours as needed., Disp: 30 tablet, Rfl: 0  Past Medical History: Past Medical History:  Diagnosis Date  . Acute ST elevation myocardial infarction (STEMI) involving right coronary artery ([place]) 10/04/2015  . Anxiety   . Anxiety and depression   . CAD (coronary artery disease)    a. CABG 06/2015: LIMA-LAD, SVG-RCA, SVG-OM2 b. STEMI s/p PCI to distal RCA lesion with a small Promus Premier stent. ( patent LIMA to the LAD, occluded SVG to OM, occluded SVG to RCA.)  . CAD (coronary artery disease) of artery bypass graft; occluded SVG to RCA and occluded SVG to OM 10/08/2015  . Empty sella ([place])   . Hidradenitis axillaris   . Hyperlipemia   . Hypertension   . Kidney stones   . Lupus dx'd 2008   "they think I have this; have to get retested" (06/12/2015)  . Obesity (BMI 30.0-34.9)   . Ovarian cyst   . Pneumonia X 2  . PONV (postoperative nausea and vomiting)   . Rheumatoid aortitis   . Rheumatoid arthritis ([place])   . S/P angioplasty with  stent; 10/04/15 to distal RCA lesion. with Promus premier. 10/08/2015  . Scoliosis   . Stroke [facility]) March 2014   left MCA branches; "they say I have them all the time", denies residual on 06/12/2015  . Tobacco abuse   . Type II diabetes mellitus (HCC)     Tobacco Use: History  Smoking Status  . Former Smoker  . Packs/day: 0.50  . Years: 36.00  . Types: Cigarettes  . Quit date: 06/12/2014  Smokeless Tobacco  . Never Used    Labs: Recent Review Flowsheet Data    Labs for ITP Cardiac and Pulmonary Rehab Latest Ref Rng & Units 06/19/2015 06/20/2015 06/20/2015 10/04/2015 10/05/2015   Cholestrol 0 - 200 mg/dL - - - - 160   LDLCALC 0 - 99 mg/dL - - - - 76   HDL >40 mg/dL - - - - 56   Trlycerides <150 mg/dL - -  - - 142   Hemoglobin A1c 4.8 - 5.6 % - - - - -   PHART 7.350 - 7.450 - 7.374 - - -   PCO2ART 35.0 - 45.0 mmHg - 44.0 - - -   HCO3 20.0 - 24.0 mEq/L - 25.4(H) - - -   TCO2 0 - 100 mmol/L _0 -   ACIDBASEDEF 0.0 - 2.0 mmol/L - - - - -   O2SAT % - 92.0 - - -      Capillary Blood Glucose: Lab Results  Component Value Date   GLUCAP 194 (H) 02/20/2016   GLUCAP 131 (H) 02/18/2016   GLUCAP 126 (H) 02/06/2016   GLUCAP 123 (H) 02/04/2016   GLUCAP 127 (H) 02/01/2016     Exercise Target Goals:    Exercise Program Goal: Individual exercise prescription set with THRR, safety & activity barriers. Participant demonstrates ability to understand and report RPE using BORG scale, to self-measure pulse accurately, and to acknowledge the importance of the exercise prescription.  Exercise Prescription Goal: Starting with aerobic activity 30 plus minutes a day, 3 days per week for initial exercise prescription. Provide home exercise prescription and guidelines that participant acknowledges understanding prior to discharge.  Activity Barriers & Risk Stratification:     Activity Barriers & Cardiac Risk Stratification - 11/22/15 0822      Activity Barriers & Cardiac Risk Stratification   Activity Barriers Back Problems;Other (comment)   Comments R upper arm pain    Cardiac Risk Stratification High      6 Minute Walk:     6 Minute Walk    Row Name 08/28/15 1705 11/22/15 1228       6 Minute Walk   Phase Initial Initial    Distance 1610 feet 1328 feet    Walk Time 6 minutes 6 minutes    # of Rest Breaks 0 0    MPH 3.05 2.52    METS 4.13 3.3    RPE 11 11    VO2 Peak 14.45 11.56    Symptoms Yes (comment) No    Comments Bilateral ankle soreness secondary to RA and bone spurs.  -    Resting HR 91 bpm 67 bpm    Resting BP 138/88 124/88    Max Ex. HR 114 bpm 94 bpm    Max Ex. BP 144/90 132/90    2 Minute Post BP 132/90 134/80       Initial Exercise Prescription:      Initial Exercise Prescription - 11/22/15 1200      Date of Initial Exercise  RX and Referring Provider   Date 11/22/15   Referring Provider Martinique, Peter MD     Bike   Level --   Minutes --   METs --     Recumbant Bike   Level 2   Minutes 10   METs 2     NuStep   Level 3   Minutes 10   METs 2     Track   Laps 10   Minutes 10   METs 2.74     Prescription Details   Frequency (times per week) 3   Duration Progress to 30 minutes of continuous aerobic without signs/symptoms of physical distress     Intensity   THRR 40-80% of Max Heartrate 65-131   Ratings of Perceived Exertion 11-13   Perceived Dyspnea 0-4     Progression   Progression Continue to progress workloads to maintain intensity without signs/symptoms of physical distress.     Resistance Training   Training Prescription Yes   Weight 2 lbs   Reps 10-12      Perform Capillary Blood Glucose checks as needed.  Exercise Prescription Changes:     Exercise Prescription Changes    Row Name 09/17/15 1500 11/29/15 1100 12/19/15 1600 01/22/16 0900 02/19/16 1000     Exercise Review   Progression Yes Yes  -  -  -     Response to Exercise   Blood Pressure (Admit) 132/80 128/90 128/84 158/96 160/108  150 98   Blood Pressure (Exercise) 128/90 142/80 122/80 174/98 140/100   Blood Pressure (Exit) 120/60 138/84 116/74 134/86 155/104  137 83   Heart Rate (Admit)  - 82 bpm 78 bpm 86 bpm 79 bpm   Heart Rate (Exercise) 99 bpm 103 bpm 95 bpm 102 bpm 104 bpm   Heart Rate (Exit) 136 bpm 83 bpm 74 bpm 79 bpm 78 bpm   Oxygen Saturation (Admit) 99 %  -  -  -  -   Rating of Perceived Exertion (Exercise) _0 Symptoms  -  -  -  - none   Comments  -  - Reviewed on 12/19/15 Reviewed on 12/19/15 Reviewed on 12/19/15   Duration Progress to 30 minutes of continuous aerobic without signs/symptoms of physical distress Progress to 30 minutes of continuous aerobic without signs/symptoms of physical distress Progress to 30  minutes of continuous aerobic without signs/symptoms of physical distress Progress to 30 minutes of continuous aerobic without signs/symptoms of physical distress Progress to 30 minutes of continuous aerobic without signs/symptoms of physical distress   Intensity _1      Progression   Progression Continue to progress workloads to maintain intensity without signs/symptoms of physical distress. Continue to progress workloads to maintain intensity without signs/symptoms of physical distress. Continue to progress workloads to maintain intensity without signs/symptoms of physical distress. Continue to progress workloads to maintain intensity without signs/symptoms of physical distress. Continue to progress workloads to maintain intensity without signs/symptoms of physical distress.   Average METs 2.5 2.6 2.2 2.6 3     Resistance Training   Training Prescription _2    Weight 3LBS 2 lbs 2 lbs 2lbs 2lbs   Reps 10-12 10-12 10-12 10-12 10-12     Treadmill   MPH -  -  -  -  -   Grade -  -  -  -  -   Minutes -  -  -  -  -  METs -  -  -  -  -     Bike   Level 1.2  -  -  -  -   Minutes 10  -  -  -  -   METs 3.38  -  -  -  -     Recumbant Bike   Level  - 2 2.5 2.5  -   Minutes  - _0 -   METs  - 2 2 2.1  -     NuStep   Level _1 Minutes _2 METs 2 2.1 1.7 2.6 2.6     Track   Laps _3 Minutes _4 3.43   METs 2.92 3.26 3.09 3.09 3.09     Home Exercise Plan   Plans to continue exercise at  -  - Home  Reviewed HEP on 12/19/15. See progress note Home  Reviewed HEP on 12/19/15. See progress note Home  Reviewed HEP on 12/19/15. See progress note   Frequency  -  - Add 2 additional days to program exercise sessions. Add 2 additional days to program exercise sessions. Add 2 additional days to program exercise sessions.      Exercise Comments:     Exercise  Comments    Row Name 11/29/15 1121 12/19/15 1641 01/22/16 0946 02/19/16 1052     Exercise Comments Pt is tolerating exercise well; there are no changes to current Ex Rx. Will continue to monitor pt progress with activity/exercisr Reviewed METs and goals. Pt is tolerating exercise well; will continue to monitor exercise progression. Reviewed METs and goals. Pt is tolerating exercise well; will continue to monitor exercise progression. Reviewed METs and goals. Pt is tolerating exercise well; will continue to monitor exercise progression.       Discharge Exercise Prescription (Final Exercise Prescription Changes):     Exercise Prescription Changes - 02/19/16 1000      Response to Exercise   Blood Pressure (Admit) 160/108  150 98   Blood Pressure (Exercise) 140/100   Blood Pressure (Exit) 155/104  137 83   Heart Rate (Admit) 79 bpm   Heart Rate (Exercise) 104 bpm   Heart Rate (Exit) 78 bpm   Rating of Perceived Exertion (Exercise) 13   Symptoms none   Comments Reviewed on 12/19/15   Duration Progress to 30 minutes of continuous aerobic without signs/symptoms of physical distress   Intensity THRR unchanged     Progression   Progression Continue to progress workloads to maintain intensity without signs/symptoms of physical distress.   Average METs 3     Resistance Training   Training Prescription Yes   Weight 2lbs   Reps 10-12     NuStep   Level 4   Minutes 10   METs 2.6     Track   Laps 14   Minutes 3.43   METs 3.09     Home Exercise Plan   Plans to continue exercise at [facility] on 12/19/15. See progress note   Frequency Add 2 additional days to program exercise sessions.      Nutrition:  Target Goals: Understanding of nutrition guidelines, daily intake of sodium <1532m, cholesterol <2075m calories 30% from fat and 7% or less from saturated fats, daily to have 5 or more servings of fruits and vegetables.  Biometrics:     Pre Biometrics - 11/22/15 1336  Pre Biometrics   Waist Circumference 44 inches   Hip Circumference 47.75 inches   Waist to Hip Ratio 0.92 %   Triceps Skinfold 45 mm   % Body Fat 35.7 %   Grip Strength 32.5 kg   Flexibility 8 in   Single Leg Stand 4.8 seconds       Nutrition Therapy Plan and Nutrition Goals:     Nutrition Therapy & Goals - 09/14/15 1456      Nutrition Therapy   Diet Carb Modified, Therapeutic Lifestyle Changes     Personal Nutrition Goals   Personal Goal #1 1-2 lb wt loss per week for a wt loss goal of 6-24 lb   Personal Goal #2 Improved glycemic control as evidenced by a decrease in pt's HbA1c with an ultimate goal of < 7.0     Intervention Plan   Intervention Prescribe, educate and counsel regarding individualized specific dietary modifications aiming towards targeted core components such as weight, hypertension, lipid management, diabetes, heart failure and other comorbidities.;Nutrition handout(s) given to patient.  1500 kcal, 5 day menu ideas   Expected Outcomes Short Term Goal: Understand basic principles of dietary content, such as calories, fat, sodium, cholesterol and nutrients.;Long Term Goal: Adherence to prescribed nutrition plan.      Nutrition Discharge: Nutrition Scores:     Nutrition Assessments - 11/23/15 1359      MEDFICTS Scores   Pre Score 6      Nutrition Goals Re-Evaluation:     Nutrition Goals Re-Evaluation    Row Name 12/17/15 0920             Personal Goal #1 Re-Evaluation   Personal Goal #1 1-2 lb wt loss per week for a wt loss goal of 6-24 lb       Goal Progress Seen No       Comments Pt wt is up 1.8 kg.         Personal Goal #2 Re-Evaluation   Personal Goal #2 Improved glycemic control as evidenced by a decrease in pt's HbA1c with an ultimate goal of < 7.0       Goal Progress Seen Yes       Comments Per pt, last A1c 11/2015 was 7.6         Intervention Plan   Intervention Continue to educate, counsel and set short/long term goals  regarding individualized specific personal dietary modifications.          Psychosocial: Target Goals: Acknowledge presence or absence of depression, maximize coping skills, provide positive support system. Participant is able to verbalize types and ability to use techniques and skills needed for reducing stress and depression.  Initial Review & Psychosocial Screening:     Initial Psych Review & Screening - 11/30/15 1154      Initial Review   Current issues with History of Depression     Family Dynamics   Good Support System? No   Concerns Recent loss of significant other;Inappropriate over/under dependence on family/friends   Comments pt overdependent on adult son.  pt husband passes away one year ago.       Barriers   Psychosocial barriers to participate in program The patient should benefit from training in stress management and relaxation.     Screening Interventions   Interventions Encouraged to exercise      Quality of Life Scores:     Quality of Life - 12/12/15 1721      Quality of Life Scores   Socioeconomic Pre --  pt presently lives with her son.  there are financial barriers iin the home.  difficulty affording food and utilities.  pt is being followed by [facility] CSW   GLOBAL Pre --  pt has altered grief pattern from death of spouse 1 year ago. pt suffers from depression, stress and anxiety. pt has been referred to [facility] for counseling.       PHQ-9: Recent Review Flowsheet Data    Depression screen [facility] 2/9 11/29/2015 11/28/2015 09/14/2015 09/03/2015 07/05/2015   Decreased Interest 0 0 0 0 0   Down, Depressed, Hopeless 0 0 1 1 0   PHQ - 2 Score 0 0 1 1 0      Psychosocial Evaluation and Intervention:     Psychosocial Evaluation - 12/26/15 1009      Psychosocial Evaluation & Interventions   Interventions Stress management education;Relaxation education;Encouraged to exercise with the program and follow exercise prescription;Therapist referral    Comments pt demonstrates significant situational stress with poor coping skills, pt referred to [facility] 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 pt has submitted [employer] application and has met with her school advisor to make plans for academic success    Continued Psychosocial Services Needed Yes      Psychosocial Re-Evaluation:     Psychosocial Re-Evaluation    Row Name 12/07/15 0640 12/26/15 1015 01/22/16 0921 02/20/16 0849 02/20/16 1016     Psychosocial Re-Evaluation   Interventions Therapist referral;Physician referral;Stress management education;Relaxation education;Encouraged to attend Cardiac Rehabilitation for the exercise Therapist referral;Stress management education;Relaxation education;Physician referral;Encouraged to attend Cardiac Rehabilitation for the exercise Encouraged to attend Cardiac Rehabilitation for the exercise;Stress management education;Relaxation education (P)  Encouraged to attend Cardiac Rehabilitation for the exercise;Stress management education;Relaxation education Encouraged to attend Cardiac Rehabilitation for the exercise;Relaxation education;Stress management education   Comments pt has significant depression, situational stress and unresolved grief pattern.    - pt depression symptoms somewhat improving. pt continues to not be accepting of offers for physician or therapy referrals to help manage symptoms.  pt is learning to manage her psychosocial barriers such as family situational stress and transportation. pt has successfully applied for [employer] transportation and is looking into resources to live independently.    - pt with continued depression symptoms.  pt continues to decline offer for physician and therapy referrals to help manage symptoms. however pt did keep scheduled PCP appt yesterday to address chronic health conditions.  pt with continued family situational stress.  pt encouraged to  continue learning coping strategies and self care strategies to more effectively manage stress.  pt has qualified for [employer] transportation which has been very beneficial for her.  pt is nearing the end of her cardiac rehab sessions.  pt is undecided how many more sessions she would like to attend. pt refused to take exit homework packet.    Continued Psychosocial Services Needed Yes Yes Yes  - No      Vocational Rehabilitation: Provide vocational rehab assistance to qualifying candidates.   Vocational Rehab Evaluation & Intervention:     Vocational Rehab - 08/28/15 1650      Initial Vocational Rehab Evaluation & Intervention   Assessment shows need for Vocational Rehabilitation No      Education: Education Goals: Education classes will be provided on a weekly basis, covering required topics. Participant will state understanding/return demonstration of topics presented.  Learning Barriers/Preferences:     Learning Barriers/Preferences - 11/22/15 0827      Learning  Barriers/Preferences   Learning Barriers Sight   Learning Preferences Skilled Demonstration;Verbal Instruction      Education Topics: Count Your Pulse:  -Group instruction provided by verbal instruction, demonstration, patient participation and written materials to support subject.  Instructors address importance of being able to find your pulse and how to count your pulse when at home without a heart monitor.  Patients get hands on experience counting their pulse with staff help and individually.   Heart Attack, Angina, and Risk Factor Modification:  -Group instruction provided by verbal instruction, video, and written materials to support subject.  Instructors address signs and symptoms of angina and heart attacks.    Also discuss risk factors for heart disease and how to make changes to improve heart health risk factors. Flowsheet Row CARDIAC REHAB PHASE II EXERCISE from 01/30/2016 in [place]  Date  01/02/16  Instruction Review Code  2- meets goals/outcomes      Functional Fitness:  -Group instruction provided by verbal instruction, demonstration, patient participation, and written materials to support subject.  Instructors address safety measures for doing things around the house.  Discuss how to get up and down off the floor, how to pick things up properly, how to safely get out of a chair without assistance, and balance training.   Meditation and Mindfulness:  -Group instruction provided by verbal instruction, patient participation, and written materials to support subject.  Instructor addresses importance of mindfulness and meditation practice to help reduce stress and improve awareness.  Instructor also leads participants through a meditation exercise.    Stretching for Flexibility and Mobility:  -Group instruction provided by verbal instruction, patient participation, and written materials to support subject.  Instructors lead participants through series of stretches that are designed to increase flexibility thus improving mobility.  These stretches are additional exercise for major muscle groups that are typically performed during regular warm up and cool down. Flowsheet Row CARDIAC REHAB PHASE II EXERCISE from 01/30/2016 in [place]  Date  09/05/15  Educator  Luetta Nutting Iline Oven  Instruction Review Code  2- meets goals/outcomes      Hands Only CPR Anytime:  -Group instruction provided by verbal instruction, video, patient participation and written materials to support subject.  Instructors co-teach with AHA video for hands only CPR.  Participants get hands on experience with mannequins.   Nutrition I class: Heart Healthy Eating:  -Group instruction provided by PowerPoint slides, verbal discussion, and written materials to support subject matter. The instructor gives an explanation and review of the Therapeutic  Lifestyle Changes diet recommendations, which includes a discussion on lipid goals, dietary fat, sodium, fiber, plant stanol/sterol esters, sugar, and the components of a well-balanced, healthy diet. Flowsheet Row CARDIAC REHAB PHASE II EXERCISE from 01/30/2016 in [place]  Date  09/14/15  Educator  RD  Instruction Review Code  Not applicable [class handout given]      Nutrition II class: Lifestyle Skills:  -Group instruction provided by PowerPoint slides, verbal discussion, and written materials to support subject matter. The instructor gives an explanation and review of label reading, grocery shopping for heart health, heart healthy recipe modifications, and ways to make healthier choices when eating out. Flowsheet Row CARDIAC REHAB PHASE II EXERCISE from 01/30/2016 in [place]  Date  09/14/15  Educator  RD  Instruction Review Code  Not applicable [class handout given]      Diabetes Question &  Answer:  -Group instruction provided by PowerPoint slides, verbal discussion, and written materials to support subject matter. The instructor gives an explanation and review of diabetes co-morbidities, pre- and post-prandial blood glucose goals, pre-exercise blood glucose goals, signs, symptoms, and treatment of hypoglycemia and hyperglycemia, and foot care basics. Flowsheet Row CARDIAC REHAB PHASE II EXERCISE from 01/30/2016 in [place]  Date  12/28/15  Educator  RD  Instruction Review Code  2- meets goals/outcomes [class handout given]      Diabetes Blitz:  -Group instruction provided by PowerPoint slides, verbal discussion, and written materials to support subject matter. The instructor gives an explanation and review of the physiology behind type 1 and type 2 diabetes, diabetes medications and rational behind using different medications, pre- and post-prandial blood glucose recommendations and  Hemoglobin A1c goals, diabetes diet, and exercise including blood glucose guidelines for exercising safely.  Flowsheet Row CARDIAC REHAB PHASE II EXERCISE from 01/30/2016 in [place]  Date  09/14/15  Educator  RD  Instruction Review Code  Not applicable [class handout given]      Portion Distortion:  -Group instruction provided by PowerPoint slides, verbal discussion, written materials, and food models to support subject matter. The instructor gives an explanation of serving size versus portion size, changes in portions sizes over the last 20 years, and what consists of a serving from each food group. Flowsheet Row CARDIAC REHAB PHASE II EXERCISE from 01/30/2016 in [place]  Date  12/19/15  Educator  RD  Instruction Review Code  2- meets goals/outcomes      Stress Management:  -Group instruction provided by verbal instruction, video, and written materials to support subject matter.  Instructors review role of stress in heart disease and how to cope with stress positively.   Flowsheet Row CARDIAC REHAB PHASE II EXERCISE from 01/30/2016 in [place]  Date  01/30/16  Instruction Review Code  2- meets goals/outcomes      Exercising on Your Own:  -Group instruction provided by verbal instruction, power point, and written materials to support subject.  Instructors discuss benefits of exercise, components of exercise, frequency and intensity of exercise, and end points for exercise.  Also discuss use of nitroglycerin and activating EMS.  Review options of places to exercise outside of rehab.  Review guidelines for sex with heart disease. Flowsheet Row CARDIAC REHAB PHASE II EXERCISE from 01/30/2016 in [place]  Date  12/05/15  Instruction Review Code  2- meets goals/outcomes      Cardiac Drugs I:  -Group instruction provided by verbal instruction and  written materials to support subject.  Instructor reviews cardiac drug classes: antiplatelets, anticoagulants, beta blockers, and statins.  Instructor discusses reasons, side effects, and lifestyle considerations for each drug class.   Cardiac Drugs II:  -Group instruction provided by verbal instruction and written materials to support subject.  Instructor reviews cardiac drug classes: angiotensin converting enzyme inhibitors (ACE-I), angiotensin II receptor blockers (ARBs), nitrates, and calcium channel blockers.  Instructor discusses reasons, side effects, and lifestyle considerations for each drug class. Flowsheet Row CARDIAC REHAB PHASE II EXERCISE from 01/30/2016 in [place]  Date  11/28/15  Educator  pharmacy  Instruction Review Code  2- meets goals/outcomes      Anatomy and Physiology of the Circulatory System:  -Group instruction provided by verbal instruction, video, and written materials to support subject.  Reviews functional anatomy of heart, how it relates to various diagnoses, and what role the heart plays in the overall system. Flowsheet Row CARDIAC REHAB PHASE II EXERCISE from 01/30/2016 in [place]  Date  01/16/16  Instruction Review Code  2- meets goals/outcomes      Knowledge Questionnaire Score:     Knowledge Questionnaire Score - 11/23/15 1359      Knowledge Questionnaire Score   Pre Score     DM 13/15      Core Components/Risk Factors/Patient Goals at Admission:     Personal Goals and Risk Factors at Admission - 11/22/15 0828      Core Components/Risk Factors/Patient Goals on Admission    Weight Management Obesity;Weight Loss;Yes   Intervention Weight Management: Develop a combined nutrition and exercise program designed to reach desired caloric intake, while maintaining appropriate intake of nutrient and fiber, sodium and fats, and appropriate energy expenditure required for the weight  goal.;Weight Management: Provide education and appropriate resources to help participant work on and attain dietary goals.;Weight Management/Obesity: Establish reasonable short term and long term weight goals.;Obesity: Provide education and appropriate resources to help participant work on and attain dietary goals.   Expected Outcomes Long Term: Adherence to nutrition and physical activity/exercise program aimed toward attainment of established weight goal;Short Term: Continue to assess and modify interventions until short term weight is achieved;Weight Maintenance: Understanding of the daily nutrition guidelines, which includes 25-35% calories from fat, 7% or less cal from saturated fats, less than 228m cholesterol, less than 1.5gm of sodium, & 5 or more servings of fruits and vegetables daily;Weight Loss: Understanding of general recommendations for a balanced deficit meal plan, which promotes 1-2 lb weight loss per week and includes a negative energy balance of [phone] kcal/d;Understanding recommendations for meals to include 15-35% energy as protein, 25-35% energy from fat, 35-60% energy from carbohydrates, less than 2045mof dietary cholesterol, 20-35 gm of total fiber daily;Understanding of distribution of calorie intake throughout the day with the consumption of 4-5 meals/snacks   Increase Strength and Stamina Yes   Intervention Provide advice, education, support and counseling about physical activity/exercise needs.;Develop an individualized exercise prescription for aerobic and resistive training based on initial evaluation findings, risk stratification, comorbidities and participant's personal goals.   Expected Outcomes Achievement of increased cardiorespiratory fitness and enhanced flexibility, muscular endurance and strength shown through measurements of functional capacity and personal statement of participant.   Improve shortness of breath with ADL's Yes   Intervention Provide education,  individualized exercise plan and daily activity instruction to help decrease symptoms of SOB with activities of daily living.   Expected Outcomes Short Term: Achieves a reduction of symptoms when performing activities of daily living.   Diabetes Yes   Intervention Provide education about signs/symptoms and action to take for hypo/hyperglycemia.;Provide education about proper nutrition, including hydration, and aerobic/resistive exercise prescription along with prescribed medications to achieve blood glucose in normal ranges: Fasting glucose 65-99 mg/dL   Expected Outcomes Short Term: Participant verbalizes understanding of the signs/symptoms and immediate care of hyper/hypoglycemia, proper foot care and importance of medication, aerobic/resistive exercise and nutrition plan for blood glucose control.;Long Term: Attainment of HbA1C < 7%.   Hypertension Yes   Intervention Provide education on lifestyle modifcations including regular physical activity/exercise, weight management, moderate sodium restriction and increased consumption of fresh fruit, vegetables, and low fat dairy, alcohol moderation, and smoking cessation.;Monitor prescription use compliance.   Expected Outcomes Short Term: Continued assessment and intervention until BP  is < 140/22m HG in hypertensive participants. < 130/898mHG in hypertensive participants with diabetes, heart failure or chronic kidney disease.;Long Term: Maintenance of blood pressure at goal levels.   Lipids Yes   Intervention Provide education and support for participant on nutrition & aerobic/resistive exercise along with prescribed medications to achieve LDL <7064mHDL >75m62m Expected Outcomes Short Term: Participant states understanding of desired cholesterol values and is compliant with medications prescribed. Participant is following exercise prescription and nutrition guidelines.;Long Term: Cholesterol controlled with medications as prescribed, with individualized  exercise RX and with personalized nutrition plan. Value goals: LDL < 70mg50mL > 40 mg.   Stress Yes   Intervention Offer individual and/or small group education and counseling on adjustment to heart disease, stress management and health-related lifestyle change. Teach and support self-help strategies.;Refer participants experiencing significant psychosocial distress to appropriate mental health specialists for further evaluation and treatment. When possible, include family members and significant others in education/counseling sessions.   Expected Outcomes Short Term: Participant demonstrates changes in health-related behavior, relaxation and other stress management skills, ability to obtain effective social support, and compliance with psychotropic medications if prescribed.;Long Term: Emotional wellbeing is indicated by absence of clinically significant psychosocial distress or social isolation.   Personal Goal Other Yes   Personal Goal To "get some me time" and focus on self. Gain independence and get life back   Intervention Provide stress management classes and coping mechanisms to assist with improving self assurance and self confidence   Expected Outcomes Pt will be able to live independently, get life back and focus on self      Core Components/Risk Factors/Patient Goals Review:      Goals and Risk Factor Review    Row Name 12/19/15 1642 01/22/16 0946 02/19/16 1052         Core Components/Risk Factors/Patient Goals Review   Personal Goals Review Other Other;Stress;Weight Management/Obesity Other;Stress     Review Pt struggles to get "me time"; lives in a high stressfull situation. Discussed coping mechanisms for stress and different ways to distress and focus on self.  Pt is not eating and has many stressors that has caused her to gain weight. Pt is depressed and has alot of family stress. Discussed meeting with counselor or Bob HJeanella Crazeis not interested at this time.     Expected  Outcomes Pt will learn better coping mechanisms for stress and take better care of self. Pt will learn better coping mechanisms for stress and take better care of self. Pt will learn better coping mechanisms for stress and take better care of self.        Core Components/Risk Factors/Patient Goals at Discharge (Final Review):      Goals and Risk Factor Review - 02/19/16 1052      Core Components/Risk Factors/Patient Goals Review   Personal Goals Review Other;Stress   Review Pt is depressed and has alot of family stress. Discussed meeting with counselor or Bob HJeanella Crazeis not interested at this time.   Expected Outcomes Pt will learn better coping mechanisms for stress and take better care of self.      ITP Comments:     ITP Comments    Row Name 08/28/15 1642 11/22/15 0819 12/21/15 1351       ITP Comments Dr. TraciFransico HimMedical Director  Dr. TraciFransico HimMedical Director  Patient attended the "Taking the high out of high blood pressure" video/lecture education class on 12/21/15. Met outcomes/goals.  Comments: Pt is making expected progress toward personal goals after completing 23 sessions. Recommend continued exercise and life style modification education including  stress management and relaxation techniques to decrease cardiac risk profile.

## 2016-02-20 NOTE — Progress Notes (Signed)
Cardiology Office Note    Date:  02/22/2016   ID:  [name] [name], DOB [birth year], MRN [id number]  PCP:  Leeroy Cha, MD  Cardiologist:  Dr. Martinique  Chief Complaint: follow up CAD  History of Present Illness:   [name] is a 55 y.o. female with past medical history of CAD (s/p CABG on 06/19/2015 w/ LIMA-LAD, SVG-RCA, SVG-OM2), Lupus, Type 2 DM, HTN, and HLD who is seen for follow up CAD.   In April hospitalized with NSTEMI and cath with severe 3-vessel disease with 80% RCA stenosis, 85% Prox LAD stenosis, and 90% 1st Mrg with 85% 2nd Mrg. CABG was recommended and this was performed on 4/4 with the LIMA-LAD, SVG-RCA, and SVG-OM2. She was started on ASA and Plavix.  She presented 10/04/15 to [facility] with complained of chest discomfort for the past 3 weeks. Upon arrival to the ED, she was noted to have ST elevation in the inferior and lateral leads of her EKG. Emergently to cardiac catheterization revealed Patent LIMA to the LAD, occluded SVG to OM, occluded SVG to RCA.  Dis. RCA 99% stenosed and she underwent emergent PCI with Promus DES to distal RCA. Her troponin peaked at 14 and then began trending downward. Plavix changed to [place]. Pt had persistent ST elevation inferiorly and tachycardia. Increased BB.Echo with EF 55-60% inferior hypokinesis and relatively hyperdynamic anterior wall motion.  Zocor changed to lipitor.   Presents today for follow up. Feeling well. No exertional or rest chest pain or sob. Denies orthopnea, PND or syncope. She is walking daily and eating more healthy. Last A1c down to 6.9%. Recent bronchitis treated with antibiotics. She has a lot of life stressors but maintains a positive attitude.    Past Medical History:  Diagnosis Date  . Acute ST elevation myocardial infarction (STEMI) involving right coronary artery ([place]) 10/04/2015  . Anxiety   . Anxiety and depression   . CAD (coronary artery disease)    a. CABG 06/2015: LIMA-LAD, SVG-RCA, SVG-OM2  b. STEMI s/p PCI to distal RCA lesion with a small Promus Premier stent. ( patent LIMA to the LAD, occluded SVG to OM, occluded SVG to RCA.)  . CAD (coronary artery disease) of artery bypass graft; occluded SVG to RCA and occluded SVG to OM 10/08/2015  . Empty sella ([place])   . Hidradenitis axillaris   . Hyperlipemia   . Hypertension   . Kidney stones   . Lupus dx'd 2008   "they think I have this; have to get retested" (06/12/2015)  . Obesity (BMI 30.0-34.9)   . Ovarian cyst   . Pneumonia X 2  . PONV (postoperative nausea and vomiting)   . Rheumatoid aortitis   . Rheumatoid arthritis ([place])   . S/P angioplasty with stent; 10/04/15 to distal RCA lesion. with Promus premier. 10/08/2015  . Scoliosis   . Stroke [facility]) March 2014   left MCA branches; "they say I have them all the time", denies residual on 06/12/2015  . Tobacco abuse   . Type II diabetes mellitus ([place])     Past Surgical History:  Procedure Laterality Date  . ABDOMINAL HYSTERECTOMY    . BREAST BIOPSY Bilateral   . CARDIAC CATHETERIZATION N/A 06/13/2015   Procedure: Left Heart Cath and Coronary Angiography;  Surgeon: Darlina Mccaughey M Martinique, MD;  Location: [place] CV [facility];  Service: Cardiovascular;  Laterality: N/A;  . CARDIAC CATHETERIZATION N/A 10/04/2015   Procedure: Left Heart Cath and Coronary Angiography;  Surgeon: [name], MD;  Location: [place] CV [facility];  Service: Cardiovascular;  Laterality: N/A;  . CARDIAC CATHETERIZATION N/A 10/04/2015   Procedure: Coronary Stent Intervention;  Surgeon: [name], MD;  Location: [place] CV [facility];  Service: Cardiovascular;  Laterality: N/A;  . CORONARY ARTERY BYPASS GRAFT N/A 06/19/2015   Procedure: CORONARY ARTERY BYPASS GRAFTING (CABG) TIMES FOUR USING LEFT INTERNAL MAMMARY, RIGHT SAPHENOUS LEG VEIN AND CRYO SAPHENOUS VEIN;  Surgeon: [name], MD;  Location: [place];  Service: Open Heart Surgery;  Laterality: N/A;  . DILATION AND CURETTAGE OF UTERUS    . TEE WITHOUT  CARDIOVERSION N/A 06/19/2015   Procedure: TRANSESOPHAGEAL ECHOCARDIOGRAM (TEE);  Surgeon: [name], MD;  Location: [place];  Service: Open Heart Surgery;  Laterality: N/A;  . TONSILLECTOMY  1960s  . TUBAL LIGATION      Current Medications: Prior to Admission medications   Medication Sig Start Date End Date Taking? Authorizing Provider  acetaminophen (TYLENOL) 325 MG tablet Take 2 tablets (650 mg total) by mouth every 4 (four) hours as needed for headache or mild pain. 10/08/15   Isaiah Serge, NP  aspirin EC 81 MG EC tablet Take 1 tablet (81 mg total) by mouth daily. 06/25/15   Donielle Liston Alba, [name]  atorvastatin (LIPITOR) 80 MG tablet Take 1 tablet (80 mg total) by mouth daily at 6 PM. 10/08/15   Isaiah Serge, NP  Cholecalciferol (VITAMIN D3) 50000 units CAPS Take 50,000 Units by mouth once a week. 04/09/15   Historical Provider, MD  INVOKANA 300 MG TABS tablet Take 300 mg by mouth daily. 06/05/15   Historical Provider, MD  Iron-Vitamins (GERITOL COMPLETE PO) Take 1 tablet by mouth daily.    Historical Provider, MD  isosorbide mononitrate (IMDUR) 30 MG 24 hr tablet Take 0.5 tablets (15 mg total) by mouth daily. 10/08/15   Isaiah Serge, NP  JANUVIA 100 MG tablet Take 100 mg by mouth daily. 04/06/15   Historical Provider, MD  metFORMIN (GLUCOPHAGE) 1000 MG tablet Take 1,000 mg by mouth 2 (two) times daily. 04/06/15   Historical Provider, MD  Metoprolol Tartrate 37.5 MG TABS Take 37.5 mg by mouth 2 (two) times daily. 10/08/15   Isaiah Serge, NP  nitroGLYCERIN (NITROSTAT) 0.4 MG SL tablet Place 1 tablet (0.4 mg total) under the tongue every 5 (five) minutes as needed for chest pain. MAX 3 doses 10/03/15   Artavius Stearns M Martinique, MD  ticagrelor (BRILINTA) 90 MG TABS tablet Take 1 tablet (90 mg total) by mouth 2 (two) times daily. 10/08/15   Isaiah Serge, NP  traMADol (ULTRAM) 50 MG tablet Take 1 tablet (50 mg total) by mouth every 6 (six) hours as needed. 09/10/15   Grace Isaac, MD     Allergies:   Patient has no known allergies.   Social History   Social History  . Marital status: Widowed    Spouse name: N/A  . Number of children: 2  . Years of education: college   Occupational History  . Disability    Social History Main Topics  . Smoking status: Former Smoker    Packs/day: 0.50    Years: 36.00    Types: Cigarettes    Quit date: 06/12/2014  . Smokeless tobacco: Never Used  . Alcohol use No  . Drug use: No  . Sexual activity: No   Other Topics Concern  . None   Social History Narrative   Patient currently lives at home with her disabled husband who  was shot in 2009 and he is total care. She lives with her son and daughter and there is an 8 that comes in. Patient currently is on disability   She occasionally drinks caffeine.      Family History:  The patient's family history includes Depression in her son; Diabetes in her mother; Heart failure in her mother; Stroke in her maternal aunt.   ROS:   Please see the history of present illness.    ROS All other systems reviewed and are negative.   PHYSICAL EXAM:   VS:  BP (!) 142/88   Pulse 74   Ht 5\' 6"  (1.676 m)   Wt 212 lb 6.4 oz (96.3 kg)   BMI 34.28 kg/m    GEN: Well nourished, well developed, in no acute distress  HEENT: normal  Neck: no JVD, carotid bruits, or masses Cardiac: RRR; no murmurs, rubs, or gallops,no edema. Respiratory:  clear to auscultation bilaterally, normal work of breathing GI: soft, nontender, nondistended, + BS MS: no deformity or atrophy  Skin: warm and dry, no rash Neuro:  Alert and Oriented x 3, Strength and sensation are intact Psych: euthymic mood, full affect  Wt Readings from Last 3 Encounters:  02/22/16 212 lb 6.4 oz (96.3 kg)  01/17/16 208 lb (94.3 kg)  11/27/15 206 lb (93.4 kg)      Studies/Labs Reviewed:   EKG:  EKG is not ordered today.     Recent Labs: 06/13/2015: TSH 0.943 06/14/2015: ALT 21 06/20/2015: Magnesium 2.3 06/24/2015: Platelets  293 10/04/2015: Hemoglobin 16.0 10/07/2015: BUN 9; Creatinine, Ser 0.84; Potassium 4.5; Sodium 137   Lipid Panel    Component Value Date/Time   CHOL 160 10/05/2015 0305   TRIG 142 10/05/2015 0305   HDL 56 10/05/2015 0305   CHOLHDL 2.9 10/05/2015 0305   VLDL 28 10/05/2015 0305   LDLCALC 76 10/05/2015 0305    Additional studies/ records that were reviewed today include:   Labs from primary care: 10/22/15: cholesterol 151, triglycerides 180, LDL 67, HDL 48. A1c 7.6%. CMET and CBC normal.   Echocardiogram: 10/05/15 LV EF: 55% -   60%  ------------------------------------------------------------------- Indications:      CAD of native vessels 414.01.  ------------------------------------------------------------------- History:   Risk factors:  Hypertension. Diabetes mellitus. Dyslipidemia.  ------------------------------------------------------------------- Study Conclusions  - Left ventricle: The cavity size was normal. Wall thickness was   increased in a pattern of mild LVH. Systolic function was normal.   The estimated ejection fraction was in the range of 55% to 60%.   Inferior wall hypokinesis. Doppler parameters are consistent with   abnormal left ventricular relaxation (grade 1 diastolic   dysfunction). The E/e&' ratio is between 8-15, suggesting   indeterminate LV filling pressure. - Ventricular septum: Septal motion showed abnormal function and   dyssynergy. - Aortic valve: Trileaflet. Sclerosis without stenosis. There was   no regurgitation. - Mitral valve: Mildly thickened leaflets . There was trivial   regurgitation. - Right ventricle: The cavity size was normal. Systolic function is   low normal. - Right atrium: The atrium was normal in size. - Tricuspid valve: There was trivial regurgitation. - Pulmonary arteries: PA peak pressure: 24 mm Hg (S). - Inferior vena cava: The vessel was normal in size. The   respirophasic diameter changes were in the normal range  (>= 50%),   consistent with normal central venous pressure.  Impressions:  - Compared to a recent echo in 06/2015, the LVEF is stable at   55-60%, however, there  is inferior hypokinesis and relatively   hyperdynamic anterior wall motion.  Cardiac Catheterization: 10/04/15    Prox LAD lesion, 85% stenosed. Patent LIMA to LAD.  Severe native 3 vessel CAD.  Lat 1st Mrg-2 lesion, 100% stenosed. Occluded SVG to OM.  Ost RCA to Mid RCA lesion, 70% stenosed. Ocluded SVG to RCA,  Dist RCA lesion, 99% stenosed. Post intervention with a 2.25 x 12 Promus Premier, there is a 0% residual stenosis.  Normal LVEDP.   Switch to Brilinta from Plavix.  She needs aggressive secondary prevention.  Patient with residual ST elevation in the inferior leads but her chest pain has improved significantly.  Proximal RCA disease did not seem critical, compared to distal lesion.       ASSESSMENT & PLAN:    1. CAD s/p CABG 06/2015 with early graft failure.  - STEMI 10/04/15 with early loss of SVG to OM & SVG to RCA  s/p PCI to distal RCA lesion with a  Promus Premier stent. Patent LIMA to LAD.  Echo with EF 55-60% inferior hypokinesis and relatively hyperdynamic anterior wall motion.   -  She is asymptomatic currently. She is graduating from Cardiac Rehab this month. - Continue ASA,  Brillinta (changed from plavix), statin, metoprolol and imdur.   2. HLD - Continue statin  3. HTN - BP stable and well controlled based on records from Rehab. Will continue BB and imdur. Would not resume  Olmesartan-amlodipine-Hctz today as bp stable. Will continue to follow.   4. DM - Managed by PCP   Medication Adjustments/Labs and Tests Ordered: Current medicines are reviewed at length with the patient today.  Concerns regarding medicines are outlined above.  Medication changes, Labs and Tests ordered today are listed in the Patient Instructions below. Patient Instructions  Continue your lifestyle modifications with  diet and exercise  Continue your current therapy  Watch your blood pressure and let me know if it stays high.    Signed, Monterrius Cardosa Martinique, MD  02/22/2016 12:43 PM    [facility] [address], [place], [place]  [address] Phone: [phone]; Fax: [phone]

## 2016-02-21 DIAGNOSIS — L732 Hidradenitis suppurativa: Secondary | ICD-10-CM | POA: Diagnosis not present

## 2016-02-22 ENCOUNTER — Ambulatory Visit (INDEPENDENT_AMBULATORY_CARE_PROVIDER_SITE_OTHER): Payer: Commercial Managed Care - HMO | Admitting: Cardiology

## 2016-02-22 ENCOUNTER — Encounter: Payer: Self-pay | Admitting: Cardiology

## 2016-02-22 ENCOUNTER — Encounter (HOSPITAL_COMMUNITY): Payer: Commercial Managed Care - HMO

## 2016-02-22 VITALS — BP 142/88 | HR 74 | Ht 66.0 in | Wt 212.4 lb

## 2016-02-22 DIAGNOSIS — I25708 Atherosclerosis of coronary artery bypass graft(s), unspecified, with other forms of angina pectoris: Secondary | ICD-10-CM

## 2016-02-22 DIAGNOSIS — Z951 Presence of aortocoronary bypass graft: Secondary | ICD-10-CM

## 2016-02-22 DIAGNOSIS — E119 Type 2 diabetes mellitus without complications: Secondary | ICD-10-CM

## 2016-02-22 DIAGNOSIS — M069 Rheumatoid arthritis, unspecified: Secondary | ICD-10-CM

## 2016-02-22 DIAGNOSIS — I10 Essential (primary) hypertension: Secondary | ICD-10-CM | POA: Diagnosis not present

## 2016-02-22 DIAGNOSIS — E1165 Type 2 diabetes mellitus with hyperglycemia: Secondary | ICD-10-CM

## 2016-02-22 DIAGNOSIS — E1121 Type 2 diabetes mellitus with diabetic nephropathy: Secondary | ICD-10-CM

## 2016-02-22 NOTE — Patient Instructions (Signed)
Continue your lifestyle modifications with diet and exercise  Continue your current therapy  Watch your blood pressure and let me know if it stays high.

## 2016-02-25 ENCOUNTER — Encounter (HOSPITAL_COMMUNITY): Payer: Commercial Managed Care - HMO

## 2016-02-27 ENCOUNTER — Encounter (HOSPITAL_COMMUNITY): Admission: RE | Admit: 2016-02-27 | Payer: Commercial Managed Care - HMO | Source: Ambulatory Visit

## 2016-02-29 ENCOUNTER — Encounter (HOSPITAL_COMMUNITY): Payer: Commercial Managed Care - HMO

## 2016-03-03 ENCOUNTER — Encounter (HOSPITAL_COMMUNITY): Payer: Commercial Managed Care - HMO

## 2016-03-05 ENCOUNTER — Encounter (HOSPITAL_COMMUNITY): Payer: Commercial Managed Care - HMO

## 2016-03-07 ENCOUNTER — Encounter (HOSPITAL_COMMUNITY): Admission: RE | Admit: 2016-03-07 | Payer: Commercial Managed Care - HMO | Source: Ambulatory Visit

## 2016-03-10 ENCOUNTER — Encounter (HOSPITAL_COMMUNITY): Payer: Commercial Managed Care - HMO

## 2016-03-12 ENCOUNTER — Encounter (HOSPITAL_COMMUNITY)
Admission: RE | Admit: 2016-03-12 | Discharge: 2016-03-12 | Disposition: A | Discharge status: Home / Self Care | Payer: Commercial Managed Care - HMO | Source: Ambulatory Visit | Attending: Cardiology | Admitting: Cardiology

## 2016-03-12 ENCOUNTER — Encounter (HOSPITAL_COMMUNITY): Payer: Self-pay

## 2016-03-12 VITALS — Ht 67.0 in | Wt 212.1 lb

## 2016-03-12 DIAGNOSIS — Z951 Presence of aortocoronary bypass graft: Secondary | ICD-10-CM | POA: Diagnosis not present

## 2016-03-12 DIAGNOSIS — I214 Non-ST elevation (NSTEMI) myocardial infarction: Secondary | ICD-10-CM | POA: Diagnosis not present

## 2016-03-12 DIAGNOSIS — Z79899 Other long term (current) drug therapy: Secondary | ICD-10-CM | POA: Diagnosis not present

## 2016-03-12 DIAGNOSIS — I2111 ST elevation (STEMI) myocardial infarction involving right coronary artery: Secondary | ICD-10-CM

## 2016-03-12 LAB — GLUCOSE, CAPILLARY: Glucose-Capillary: 130 mg/dL — ABNORMAL HIGH (ref 65–99)

## 2016-03-14 ENCOUNTER — Encounter (HOSPITAL_COMMUNITY): Payer: Commercial Managed Care - HMO

## 2016-03-17 ENCOUNTER — Encounter (HOSPITAL_COMMUNITY): Payer: Commercial Managed Care - HMO

## 2016-03-19 ENCOUNTER — Encounter (HOSPITAL_COMMUNITY): Payer: Commercial Managed Care - HMO

## 2016-03-21 ENCOUNTER — Encounter (HOSPITAL_COMMUNITY): Payer: Commercial Managed Care - HMO

## 2016-03-24 ENCOUNTER — Encounter (HOSPITAL_COMMUNITY): Payer: Commercial Managed Care - HMO

## 2016-03-31 NOTE — Addendum Note (Signed)
Encounter addended by: Jewel Baize, RD on: 03/31/2016  2:34 PM<BR>    Actions taken: Flowsheet data copied forward, Visit Navigator Flowsheet section accepted

## 2016-04-17 ENCOUNTER — Encounter (HOSPITAL_COMMUNITY): Payer: Self-pay | Admitting: Psychology

## 2016-04-17 NOTE — Progress Notes (Signed)
[name] [name] is a 56 y.o. female patient discharged from counseling as pt chose on intake to f/u w/ college she is attending for care.  Outpatient Therapist Discharge Summary  [facility]    1961/01/07   Admission Date: 12/24/15   Discharge Date:  04/17/16 Reason for Discharge:  Pt chose another provider of care Diagnosis:  Adjustment D/O  Comments:  Pt may return as needed  [facility], [facility]

## 2016-04-22 DIAGNOSIS — B373 Candidiasis of vulva and vagina: Secondary | ICD-10-CM | POA: Diagnosis not present

## 2016-04-22 DIAGNOSIS — H6693 Otitis media, unspecified, bilateral: Secondary | ICD-10-CM | POA: Diagnosis not present

## 2016-04-22 DIAGNOSIS — I2510 Atherosclerotic heart disease of native coronary artery without angina pectoris: Secondary | ICD-10-CM | POA: Diagnosis not present

## 2016-05-05 ENCOUNTER — Other Ambulatory Visit: Payer: Self-pay | Admitting: Cardiology

## 2016-05-06 NOTE — Telephone Encounter (Signed)
Rx(s) sent to pharmacy electronically.  

## 2016-05-19 DIAGNOSIS — R159 Full incontinence of feces: Secondary | ICD-10-CM | POA: Diagnosis not present

## 2016-06-05 DIAGNOSIS — H524 Presbyopia: Secondary | ICD-10-CM | POA: Diagnosis not present

## 2016-06-05 DIAGNOSIS — H2513 Age-related nuclear cataract, bilateral: Secondary | ICD-10-CM | POA: Diagnosis not present

## 2016-06-05 DIAGNOSIS — E119 Type 2 diabetes mellitus without complications: Secondary | ICD-10-CM | POA: Diagnosis not present

## 2016-06-18 DIAGNOSIS — E1121 Type 2 diabetes mellitus with diabetic nephropathy: Secondary | ICD-10-CM | POA: Diagnosis not present

## 2016-06-18 DIAGNOSIS — B373 Candidiasis of vulva and vagina: Secondary | ICD-10-CM | POA: Diagnosis not present

## 2016-06-18 DIAGNOSIS — I10 Essential (primary) hypertension: Secondary | ICD-10-CM | POA: Diagnosis not present

## 2016-06-18 DIAGNOSIS — I2510 Atherosclerotic heart disease of native coronary artery without angina pectoris: Secondary | ICD-10-CM | POA: Diagnosis not present

## 2016-06-18 DIAGNOSIS — Z1231 Encounter for screening mammogram for malignant neoplasm of breast: Secondary | ICD-10-CM | POA: Diagnosis not present

## 2016-06-18 DIAGNOSIS — Z7984 Long term (current) use of oral hypoglycemic drugs: Secondary | ICD-10-CM | POA: Diagnosis not present

## 2016-06-23 ENCOUNTER — Other Ambulatory Visit: Payer: Self-pay | Admitting: Internal Medicine

## 2016-06-23 DIAGNOSIS — Z1231 Encounter for screening mammogram for malignant neoplasm of breast: Secondary | ICD-10-CM

## 2016-07-09 ENCOUNTER — Ambulatory Visit: Payer: Self-pay

## 2016-07-29 ENCOUNTER — Ambulatory Visit: Payer: Self-pay

## 2016-09-03 ENCOUNTER — Telehealth: Payer: Self-pay

## 2016-09-03 NOTE — Telephone Encounter (Signed)
Received vocational rehab form.Dr.Jordan completed.Form faxed back to fax # [phone].

## 2016-09-09 ENCOUNTER — Ambulatory Visit
Admission: RE | Admit: 2016-09-09 | Discharge: 2016-09-09 | Disposition: A | Discharge status: Home / Self Care | Payer: Medicare HMO | Source: Ambulatory Visit | Attending: Internal Medicine | Admitting: Internal Medicine

## 2016-09-09 DIAGNOSIS — Z1231 Encounter for screening mammogram for malignant neoplasm of breast: Secondary | ICD-10-CM

## 2016-09-18 NOTE — Progress Notes (Signed)
Cardiology Office Note    Date:  09/19/2016   ID:  [name] [name], DOB December 16, 1960, MRN [id number]  PCP:  Leeroy Cha, MD  Cardiologist:  Dr. Martinique  Chief Complaint: follow up CAD  History of Present Illness:   [name] [name] is a 56 y.o. female with past medical history of CAD (s/p CABG on 06/19/2015 w/ LIMA-LAD, SVG-RCA, SVG-OM2), Lupus, Type 2 DM, HTN, and HLD who is seen for follow up CAD.   In April 2017 hospitalized with NSTEMI and cath with severe 3-vessel disease with 80% RCA stenosis, 85% Prox LAD stenosis, and 90% 1st Mrg with 85% 2nd Mrg. CABG was recommended and this was performed on 4/4 with the LIMA-LAD, SVG-RCA, and SVG-OM2. She was started on ASA and Plavix.  She presented 10/04/15 to [facility] with complained of chest discomfort for the past 3 weeks. Upon arrival to the ED, she was noted to have ST elevation in the inferior and lateral leads of her EKG. Emergently to cardiac catheterization revealed Patent LIMA to the LAD, occluded SVG to OM, occluded SVG to RCA.  Dis. RCA 99% stenosed and she underwent emergent PCI with Promus DES to distal RCA. Her troponin peaked at 14 and then began trending downward. Plavix changed to [place]. Pt had persistent ST elevation inferiorly and tachycardia. Increased BB.Echo with EF 55-60% inferior hypokinesis and relatively hyperdynamic anterior wall motion.  Zocor changed to lipitor.   Presents today for follow up. Feeling well. No exertional or rest chest pain. She does have chronic SOB which she attributes to Brilinta.  Denies orthopnea, PND or syncope. She is walking daily and trying to eat healthy.  She has a lot of life stressors but maintains a positive attitude.    Past Medical History:  Diagnosis Date  . Acute ST elevation myocardial infarction (STEMI) involving right coronary artery ([place]) 10/04/2015  . Anxiety   . Anxiety and depression   . CAD (coronary artery disease)    a. CABG 06/2015: LIMA-LAD, SVG-RCA, SVG-OM2 b.  STEMI s/p PCI to distal RCA lesion with a small Promus Premier stent. ( patent LIMA to the LAD, occluded SVG to OM, occluded SVG to RCA.)  . CAD (coronary artery disease) of artery bypass graft; occluded SVG to RCA and occluded SVG to OM 10/08/2015  . Empty sella ([place])   . Hidradenitis axillaris   . Hyperlipemia   . Hypertension   . Kidney stones   . Lupus dx'd 2008   "they think I have this; have to get retested" (06/12/2015)  . Obesity (BMI 30.0-34.9)   . Ovarian cyst   . Pneumonia X 2  . PONV (postoperative nausea and vomiting)   . Rheumatoid aortitis   . Rheumatoid arthritis ([place])   . S/P angioplasty with stent; 10/04/15 to distal RCA lesion. with Promus premier. 10/08/2015  . Scoliosis   . Stroke [facility]) March 2014   left MCA branches; "they say I have them all the time", denies residual on 06/12/2015  . Tobacco abuse   . Type II diabetes mellitus ([place])     Past Surgical History:  Procedure Laterality Date  . ABDOMINAL HYSTERECTOMY    . BREAST BIOPSY Left   . CARDIAC CATHETERIZATION N/A 06/13/2015   Procedure: Left Heart Cath and Coronary Angiography;  Surgeon: Navin Dogan M [place], MD;  Location: [place] [facility];  Service: Cardiovascular;  Laterality: N/A;  . CARDIAC CATHETERIZATION N/A 10/04/2015   Procedure: Left Heart Cath and Coronary Angiography;  Surgeon: [name],  MD;  Location: [place] [facility];  Service: Cardiovascular;  Laterality: N/A;  . CARDIAC CATHETERIZATION N/A 10/04/2015   Procedure: Coronary Stent Intervention;  Surgeon: [name], MD;  Location: [place] [facility];  Service: Cardiovascular;  Laterality: N/A;  . CORONARY ARTERY BYPASS GRAFT N/A 06/19/2015   Procedure: CORONARY ARTERY BYPASS GRAFTING (CABG) TIMES FOUR USING LEFT INTERNAL MAMMARY, RIGHT SAPHENOUS LEG VEIN AND CRYO SAPHENOUS VEIN;  Surgeon: [name], MD;  Location: [place];  Service: Open Heart Surgery;  Laterality: N/A;  . DILATION AND CURETTAGE OF UTERUS    . TEE WITHOUT  CARDIOVERSION N/A 06/19/2015   Procedure: TRANSESOPHAGEAL ECHOCARDIOGRAM (TEE);  Surgeon: [name], MD;  Location: [place];  Service: Open Heart Surgery;  Laterality: N/A;  . TONSILLECTOMY  1960s  . TUBAL LIGATION      Current Medications: Prior to Admission medications   Medication Sig Start Date End Date Taking? Authorizing Provider  acetaminophen (TYLENOL) 325 MG tablet Take 2 tablets (650 mg total) by mouth every 4 (four) hours as needed for headache or mild pain. 10/08/15   Isaiah Serge, NP  aspirin EC 81 MG EC tablet Take 1 tablet (81 mg total) by mouth daily. 06/25/15   Donielle Liston Alba, [name]  atorvastatin (LIPITOR) 80 MG tablet Take 1 tablet (80 mg total) by mouth daily at 6 PM. 10/08/15   Isaiah Serge, NP  Cholecalciferol (VITAMIN D3) 50000 units CAPS Take 50,000 Units by mouth once a week. 04/09/15   Historical Provider, MD  INVOKANA 300 MG TABS tablet Take 300 mg by mouth daily. 06/05/15   Historical Provider, MD  Iron-Vitamins (GERITOL COMPLETE PO) Take 1 tablet by mouth daily.    Historical Provider, MD  isosorbide mononitrate (IMDUR) 30 MG 24 hr tablet Take 0.5 tablets (15 mg total) by mouth daily. 10/08/15   Isaiah Serge, NP  JANUVIA 100 MG tablet Take 100 mg by mouth daily. 04/06/15   Historical Provider, MD  metFORMIN (GLUCOPHAGE) 1000 MG tablet Take 1,000 mg by mouth 2 (two) times daily. 04/06/15   Historical Provider, MD  Metoprolol Tartrate 37.5 MG TABS Take 37.5 mg by mouth 2 (two) times daily. 10/08/15   Isaiah Serge, NP  nitroGLYCERIN (NITROSTAT) 0.4 MG SL tablet Place 1 tablet (0.4 mg total) under the tongue every 5 (five) minutes as needed for chest pain. MAX 3 doses 10/03/15   Lesley Atkin M [place], MD  ticagrelor (BRILINTA) 90 MG TABS tablet Take 1 tablet (90 mg total) by mouth 2 (two) times daily. 10/08/15   Isaiah Serge, NP  traMADol (ULTRAM) 50 MG tablet Take 1 tablet (50 mg total) by mouth every 6 (six) hours as needed. 09/10/15   Grace Isaac, MD     Allergies:   Patient has no known allergies.   Social History   Social History  . Marital status: Widowed    Spouse name: N/A  . Number of children: 2  . Years of education: college   Occupational History  . Disability    Social History Main Topics  . Smoking status: Former Smoker    Packs/day: 0.50    Years: 36.00    Types: Cigarettes    Quit date: 06/12/2014  . Smokeless tobacco: Never Used  . Alcohol use No  . Drug use: No  . Sexual activity: No   Other Topics Concern  . None   Social History Narrative   Patient currently lives at home with her disabled  husband who was shot in 2009 and he is total care. She lives with her son and daughter and there is an 8 that comes in. Patient currently is on disability   She occasionally drinks caffeine.      Family History:  The patient's family history includes Breast cancer in her mother; Depression in her son; Diabetes in her mother; Heart failure in her mother; Stroke in her maternal aunt.   ROS:   Please see the history of present illness.    ROS All other systems reviewed and are negative.   PHYSICAL EXAM:   VS:  BP 122/84   Pulse 70   Ht 5\' 7"  (1.702 m)   Wt 220 lb (99.8 kg)   BMI 34.46 kg/m    GEN: Well nourished, well developed, in no acute distress  HEENT: normal  Neck: no JVD, carotid bruits, or masses Cardiac: RRR; no murmurs, rubs, or gallops,no edema. Respiratory:  clear to auscultation bilaterally, normal work of breathing GI: soft, nontender, nondistended, + BS MS: no deformity or atrophy  Skin: warm and dry, no rash Neuro:  Alert and Oriented x 3, Strength and sensation are intact Psych: euthymic mood, full affect  Wt Readings from Last 3 Encounters:  09/19/16 220 lb (99.8 kg)  03/25/16 212 lb 1.3 oz (96.2 kg)  02/22/16 212 lb 6.4 oz (96.3 kg)      Studies/Labs Reviewed:   EKG:  EKG is  ordered today.   NSR rate 70. Old inferior infarct. I have personally reviewed and interpreted this  study.   Recent Labs: 10/04/2015: Hemoglobin 16.0 10/07/2015: BUN 9; Creatinine, Ser 0.84; Potassium 4.5; Sodium 137   Lipid Panel    Component Value Date/Time   CHOL 160 10/05/2015 0305   TRIG 142 10/05/2015 0305   HDL 56 10/05/2015 0305   CHOLHDL 2.9 10/05/2015 0305   VLDL 28 10/05/2015 0305   LDLCALC 76 10/05/2015 0305    Additional studies/ records that were reviewed today include:   Labs from primary care: 10/22/15: cholesterol 151, triglycerides 180, LDL 67, HDL 48. A1c 7.6%. CMET and CBC normal.   Echocardiogram: 10/05/15 LV EF: 55% -   60%  ------------------------------------------------------------------- Indications:      CAD of native vessels 414.01.  ------------------------------------------------------------------- History:   Risk factors:  Hypertension. Diabetes mellitus. Dyslipidemia.  ------------------------------------------------------------------- Study Conclusions  - Left ventricle: The cavity size was normal. Wall thickness was   increased in a pattern of mild LVH. Systolic function was normal.   The estimated ejection fraction was in the range of 55% to 60%.   Inferior wall hypokinesis. Doppler parameters are consistent with   abnormal left ventricular relaxation (grade 1 diastolic   dysfunction). The E/e&' ratio is between 8-15, suggesting   indeterminate LV filling pressure. - Ventricular septum: Septal motion showed abnormal function and   dyssynergy. - Aortic valve: Trileaflet. Sclerosis without stenosis. There was   no regurgitation. - Mitral valve: Mildly thickened leaflets . There was trivial   regurgitation. - Right ventricle: The cavity size was normal. Systolic function is   low normal. - Right atrium: The atrium was normal in size. - Tricuspid valve: There was trivial regurgitation. - Pulmonary arteries: PA peak pressure: 24 mm Hg (S). - Inferior vena cava: The vessel was normal in size. The   respirophasic diameter changes were  in the normal range (>= 50%),   consistent with normal central venous pressure.  Impressions:  - Compared to a recent echo in 06/2015, the LVEF  is stable at   55-60%, however, there is inferior hypokinesis and relatively   hyperdynamic anterior wall motion.  Cardiac Catheterization: 10/04/15    Prox LAD lesion, 85% stenosed. Patent LIMA to LAD.  Severe native 3 vessel CAD.  Lat 1st Mrg-2 lesion, 100% stenosed. Occluded SVG to OM.  Ost RCA to Mid RCA lesion, 70% stenosed. Ocluded SVG to RCA,  Dist RCA lesion, 99% stenosed. Post intervention with a 2.25 x 12 Promus Premier, there is a 0% residual stenosis.  Normal LVEDP.   Switch to Brilinta from Plavix.  She needs aggressive secondary prevention.  Patient with residual ST elevation in the inferior leads but her chest pain has improved significantly.  Proximal RCA disease did not seem critical, compared to distal lesion.       ASSESSMENT & PLAN:    1. CAD s/p CABG 06/2015 with early graft failure.  - STEMI 10/04/15 with early loss of SVG to OM & SVG to RCA  s/p PCI to distal RCA lesion with a  Promus Premier stent. Patent LIMA to LAD.  Echo with EF 55-60% inferior hypokinesis and relatively hyperdynamic anterior wall motion.   -  She is asymptomatic currently.  - Continue ASA,  statin, metoprolol and imdur. She may discontinue Brilinta July 20.   2. HLD - Continue statin - will check complete blood work today.  3. HTN - BP stable and well controlled - Will continue BB and imdur.   4. DM - Managed by PCP   Medication Adjustments/Labs and Tests Ordered: Current medicines are reviewed at length with the patient today.  Concerns regarding medicines are outlined above.  Medication changes, Labs and Tests ordered today are listed in the Patient Instructions below. Patient Instructions  Continue your current therapy but you can stop Brilinta after July 20th  We will check lab work today  I will see you in 6 months.     Signed, Jhoan Schmieder [place], MD  09/19/2016 8:43 AM    [facility] [address], [place], [place]  [address] Phone: [phone]; Fax: [phone]

## 2016-09-19 ENCOUNTER — Ambulatory Visit (INDEPENDENT_AMBULATORY_CARE_PROVIDER_SITE_OTHER): Payer: Medicare HMO | Admitting: Cardiology

## 2016-09-19 ENCOUNTER — Encounter: Payer: Self-pay | Admitting: Cardiology

## 2016-09-19 VITALS — BP 122/84 | HR 70 | Ht 67.0 in | Wt 220.0 lb

## 2016-09-19 DIAGNOSIS — E785 Hyperlipidemia, unspecified: Secondary | ICD-10-CM

## 2016-09-19 DIAGNOSIS — Z951 Presence of aortocoronary bypass graft: Secondary | ICD-10-CM

## 2016-09-19 DIAGNOSIS — I10 Essential (primary) hypertension: Secondary | ICD-10-CM

## 2016-09-19 DIAGNOSIS — E119 Type 2 diabetes mellitus without complications: Secondary | ICD-10-CM

## 2016-09-19 DIAGNOSIS — I25708 Atherosclerosis of coronary artery bypass graft(s), unspecified, with other forms of angina pectoris: Secondary | ICD-10-CM

## 2016-09-19 NOTE — Patient Instructions (Signed)
Continue your current therapy but you can stop Brilinta after July 20th  We will check lab work today  I will see you in 6 months.

## 2016-09-22 LAB — BASIC METABOLIC PANEL
BUN/Creatinine Ratio: 13 (ref 9–23)
BUN: 11 mg/dL (ref 6–24)
CO2: 21 mmol/L (ref 20–29)
Calcium: 9.9 mg/dL (ref 8.7–10.2)
Chloride: 104 mmol/L (ref 96–106)
Creatinine, Ser: 0.85 mg/dL (ref 0.57–1.00)
GFR calc Af Amer: 88 mL/min/{1.73_m2} (ref >59)
GFR calc non Af Amer: 76 mL/min/{1.73_m2} (ref >59)
Glucose: 103 mg/dL — ABNORMAL HIGH (ref 65–99)
Potassium: 4.6 mmol/L (ref 3.5–5.2)
Sodium: 143 mmol/L (ref 134–144)

## 2016-09-22 LAB — CBC WITH DIFFERENTIAL/PLATELET
Basophils Absolute: 0.1 10*3/uL (ref 0.0–0.2)
Basos: 1 %
EOS (ABSOLUTE): 0.4 10*3/uL (ref 0.0–0.4)
Eos: 4 %
Hematocrit: 43.1 % (ref 34.0–46.6)
Hemoglobin: 14.2 g/dL (ref 11.1–15.9)
Immature Grans (Abs): 0 10*3/uL (ref 0.0–0.1)
Immature Granulocytes: 0 %
Lymphocytes Absolute: 2.8 10*3/uL (ref 0.7–3.1)
Lymphs: 32 %
MCH: 28.5 pg (ref 26.6–33.0)
MCHC: 32.9 g/dL (ref 31.5–35.7)
MCV: 86 fL (ref 79–97)
Monocytes Absolute: 0.7 10*3/uL (ref 0.1–0.9)
Monocytes: 8 %
Neutrophils Absolute: 4.7 10*3/uL (ref 1.4–7.0)
Neutrophils: 55 %
Platelets: 349 10*3/uL (ref 150–379)
RBC: 4.99 x10E6/uL (ref 3.77–5.28)
RDW: 15.4 % (ref 12.3–15.4)
WBC: 8.6 10*3/uL (ref 3.4–10.8)

## 2016-09-22 LAB — LIPID PANEL
Chol/HDL Ratio: 2.5 ratio (ref 0.0–4.4)
Cholesterol, Total: 157 mg/dL (ref 100–199)
HDL: 62 mg/dL (ref >39)
LDL Calculated: 77 mg/dL (ref 0–99)
Triglycerides: 89 mg/dL (ref 0–149)
VLDL Cholesterol Cal: 18 mg/dL (ref 5–40)

## 2016-09-22 LAB — HEPATIC FUNCTION PANEL
ALT: 31 IU/L (ref 0–32)
AST: 26 IU/L (ref 0–40)
Albumin: 4.3 g/dL (ref 3.5–5.5)
Alkaline Phosphatase: 98 IU/L (ref 39–117)
Bilirubin Total: 0.5 mg/dL (ref 0.0–1.2)
Bilirubin, Direct: 0.13 mg/dL (ref 0.00–0.40)
Total Protein: 7.5 g/dL (ref 6.0–8.5)

## 2016-09-22 LAB — HEMOGLOBIN A1C
Est. average glucose Bld gHb Est-mCnc: 146 mg/dL
Hgb A1c MFr Bld: 6.7 % — ABNORMAL HIGH (ref 4.8–5.6)

## 2016-11-18 ENCOUNTER — Other Ambulatory Visit: Payer: Self-pay | Admitting: Cardiology

## 2016-11-18 MED ORDER — METOPROLOL TARTRATE 37.5 MG PO TABS: 37.5000 mg | ORAL_TABLET | Freq: Two times a day (BID) | ORAL | 6 refills | Status: DC

## 2016-11-18 MED ORDER — ATORVASTATIN CALCIUM 80 MG PO TABS: ORAL_TABLET | ORAL | 6 refills | Status: DC

## 2016-11-18 MED ORDER — NITROGLYCERIN 0.4 MG SL SUBL: 0.4000 mg | SUBLINGUAL_TABLET | SUBLINGUAL | 6 refills | Status: DC | PRN

## 2016-11-18 MED ORDER — ISOSORBIDE MONONITRATE ER 30 MG PO TB24: 15.0000 mg | ORAL_TABLET | Freq: Every day | ORAL | 6 refills | Status: DC

## 2016-11-18 NOTE — Telephone Encounter (Signed)
°*  STAT* If patient is at the pharmacy, call can be transferred to refill team.   1. Which medications need to be refilled? (please list name of each medication and dose if known) Metoprolol 37.5 mg twice a day ( needs a new prescription sent )   2. Which pharmacy/location (including street and city if local pharmacy) is medication to be sent to?[facility] on [address]   3. Do they need a 30 day or 90 day supply? [place]

## 2016-11-25 DIAGNOSIS — E1121 Type 2 diabetes mellitus with diabetic nephropathy: Secondary | ICD-10-CM | POA: Diagnosis not present

## 2016-11-25 DIAGNOSIS — E785 Hyperlipidemia, unspecified: Secondary | ICD-10-CM | POA: Diagnosis not present

## 2016-11-25 DIAGNOSIS — I2102 ST elevation (STEMI) myocardial infarction involving left anterior descending coronary artery: Secondary | ICD-10-CM | POA: Diagnosis not present

## 2016-11-25 DIAGNOSIS — Z833 Family history of diabetes mellitus: Secondary | ICD-10-CM | POA: Diagnosis not present

## 2016-11-25 DIAGNOSIS — Z0000 Encounter for general adult medical examination without abnormal findings: Secondary | ICD-10-CM | POA: Diagnosis not present

## 2016-11-25 DIAGNOSIS — Z23 Encounter for immunization: Secondary | ICD-10-CM | POA: Diagnosis not present

## 2016-11-25 DIAGNOSIS — I10 Essential (primary) hypertension: Secondary | ICD-10-CM | POA: Diagnosis not present

## 2016-11-25 DIAGNOSIS — R159 Full incontinence of feces: Secondary | ICD-10-CM | POA: Diagnosis not present

## 2016-11-25 DIAGNOSIS — I2510 Atherosclerotic heart disease of native coronary artery without angina pectoris: Secondary | ICD-10-CM | POA: Diagnosis not present

## 2016-12-03 DIAGNOSIS — J029 Acute pharyngitis, unspecified: Secondary | ICD-10-CM | POA: Diagnosis not present

## 2016-12-03 DIAGNOSIS — J302 Other seasonal allergic rhinitis: Secondary | ICD-10-CM | POA: Diagnosis not present

## 2017-02-14 ENCOUNTER — Other Ambulatory Visit: Payer: Self-pay | Admitting: Cardiology

## 2017-02-27 DIAGNOSIS — Z1211 Encounter for screening for malignant neoplasm of colon: Secondary | ICD-10-CM | POA: Diagnosis not present

## 2017-02-27 DIAGNOSIS — K5730 Diverticulosis of large intestine without perforation or abscess without bleeding: Secondary | ICD-10-CM | POA: Diagnosis not present

## 2017-02-27 DIAGNOSIS — D126 Benign neoplasm of colon, unspecified: Secondary | ICD-10-CM | POA: Diagnosis not present

## 2017-03-02 ENCOUNTER — Other Ambulatory Visit: Payer: Self-pay

## 2017-03-02 ENCOUNTER — Observation Stay (HOSPITAL_COMMUNITY)
Admission: EM | Admit: 2017-03-02 | Discharge: 2017-03-03 | Disposition: A | Discharge status: Home / Self Care | Payer: Medicare HMO | Attending: Cardiology | Admitting: Cardiology

## 2017-03-02 ENCOUNTER — Encounter (HOSPITAL_COMMUNITY): Payer: Self-pay

## 2017-03-02 ENCOUNTER — Emergency Department (HOSPITAL_COMMUNITY): Payer: Medicare HMO

## 2017-03-02 DIAGNOSIS — I10 Essential (primary) hypertension: Secondary | ICD-10-CM | POA: Diagnosis not present

## 2017-03-02 DIAGNOSIS — Z95820 Peripheral vascular angioplasty status with implants and grafts: Secondary | ICD-10-CM

## 2017-03-02 DIAGNOSIS — Z87891 Personal history of nicotine dependence: Secondary | ICD-10-CM | POA: Diagnosis not present

## 2017-03-02 DIAGNOSIS — R079 Chest pain, unspecified: Secondary | ICD-10-CM | POA: Diagnosis not present

## 2017-03-02 DIAGNOSIS — Z8673 Personal history of transient ischemic attack (TIA), and cerebral infarction without residual deficits: Secondary | ICD-10-CM

## 2017-03-02 DIAGNOSIS — E785 Hyperlipidemia, unspecified: Secondary | ICD-10-CM | POA: Diagnosis not present

## 2017-03-02 DIAGNOSIS — I5043 Acute on chronic combined systolic (congestive) and diastolic (congestive) heart failure: Secondary | ICD-10-CM | POA: Diagnosis not present

## 2017-03-02 DIAGNOSIS — M329 Systemic lupus erythematosus, unspecified: Secondary | ICD-10-CM | POA: Diagnosis present

## 2017-03-02 DIAGNOSIS — Z951 Presence of aortocoronary bypass graft: Secondary | ICD-10-CM

## 2017-03-02 DIAGNOSIS — E119 Type 2 diabetes mellitus without complications: Secondary | ICD-10-CM | POA: Diagnosis not present

## 2017-03-02 DIAGNOSIS — R0902 Hypoxemia: Secondary | ICD-10-CM | POA: Diagnosis not present

## 2017-03-02 DIAGNOSIS — Z7982 Long term (current) use of aspirin: Secondary | ICD-10-CM | POA: Diagnosis not present

## 2017-03-02 DIAGNOSIS — I2510 Atherosclerotic heart disease of native coronary artery without angina pectoris: Secondary | ICD-10-CM | POA: Insufficient documentation

## 2017-03-02 DIAGNOSIS — I5033 Acute on chronic diastolic (congestive) heart failure: Secondary | ICD-10-CM | POA: Diagnosis not present

## 2017-03-02 DIAGNOSIS — E1165 Type 2 diabetes mellitus with hyperglycemia: Secondary | ICD-10-CM | POA: Diagnosis present

## 2017-03-02 DIAGNOSIS — Z7984 Long term (current) use of oral hypoglycemic drugs: Secondary | ICD-10-CM | POA: Diagnosis not present

## 2017-03-02 DIAGNOSIS — E669 Obesity, unspecified: Secondary | ICD-10-CM | POA: Insufficient documentation

## 2017-03-02 DIAGNOSIS — M069 Rheumatoid arthritis, unspecified: Secondary | ICD-10-CM | POA: Insufficient documentation

## 2017-03-02 DIAGNOSIS — I110 Hypertensive heart disease with heart failure: Secondary | ICD-10-CM | POA: Diagnosis not present

## 2017-03-02 DIAGNOSIS — Z955 Presence of coronary angioplasty implant and graft: Secondary | ICD-10-CM | POA: Insufficient documentation

## 2017-03-02 DIAGNOSIS — Z794 Long term (current) use of insulin: Secondary | ICD-10-CM | POA: Diagnosis present

## 2017-03-02 LAB — CBC
HCT: 47.1 % — ABNORMAL HIGH (ref 36.0–46.0)
HCT: 45.9 % (ref 36.0–46.0)
Hemoglobin: 15.3 g/dL — ABNORMAL HIGH (ref 12.0–15.0)
Hemoglobin: 15.1 g/dL — ABNORMAL HIGH (ref 12.0–15.0)
MCH: 28.6 pg (ref 26.0–34.0)
MCH: 28.8 pg (ref 26.0–34.0)
MCHC: 32.5 g/dL (ref 30.0–36.0)
MCHC: 32.9 g/dL (ref 30.0–36.0)
MCV: 88 fL (ref 78.0–100.0)
MCV: 87.6 fL (ref 78.0–100.0)
Platelets: 248 10*3/uL (ref 150–400)
Platelets: 279 10*3/uL (ref 150–400)
RBC: 5.35 MIL/uL — ABNORMAL HIGH (ref 3.87–5.11)
RBC: 5.24 MIL/uL — ABNORMAL HIGH (ref 3.87–5.11)
RDW: 16.4 % — ABNORMAL HIGH (ref 11.5–15.5)
RDW: 16 % — ABNORMAL HIGH (ref 11.5–15.5)
WBC: 10.2 10*3/uL (ref 4.0–10.5)
WBC: 11.5 10*3/uL — ABNORMAL HIGH (ref 4.0–10.5)

## 2017-03-02 LAB — BASIC METABOLIC PANEL
Anion gap: 11 (ref 5–15)
BUN: 14 mg/dL (ref 6–20)
CO2: 24 mmol/L (ref 22–32)
Calcium: 9.7 mg/dL (ref 8.9–10.3)
Chloride: 107 mmol/L (ref 101–111)
Creatinine, Ser: 0.82 mg/dL (ref 0.44–1.00)
GFR calc Af Amer: >60 mL/min (ref >60)
GFR calc non Af Amer: >60 mL/min (ref >60)
Glucose, Bld: 106 mg/dL — ABNORMAL HIGH (ref 65–99)
Potassium: 4.9 mmol/L (ref 3.5–5.1)
Sodium: 142 mmol/L (ref 135–145)

## 2017-03-02 LAB — I-STAT BETA HCG BLOOD, ED (MC, WL, AP ONLY): I-stat hCG, quantitative: <5 m[IU]/mL (ref <5)

## 2017-03-02 LAB — CREATININE, SERUM
Creatinine, Ser: 1.01 mg/dL — ABNORMAL HIGH (ref 0.44–1.00)
GFR calc Af Amer: >60 mL/min (ref >60)
GFR calc non Af Amer: >60 mL/min (ref >60)

## 2017-03-02 LAB — TROPONIN I
Troponin I: <0.03 ng/mL (ref <0.03)
Troponin I: <0.03 ng/mL (ref <0.03)

## 2017-03-02 LAB — I-STAT TROPONIN, ED: Troponin i, poc: 0 ng/mL (ref 0.00–0.08)

## 2017-03-02 LAB — BRAIN NATRIURETIC PEPTIDE: B Natriuretic Peptide: 79.5 pg/mL (ref 0.0–100.0)

## 2017-03-02 LAB — GLUCOSE, CAPILLARY
Glucose-Capillary: 153 mg/dL — ABNORMAL HIGH (ref 65–99)
Glucose-Capillary: 121 mg/dL — ABNORMAL HIGH (ref 65–99)

## 2017-03-02 LAB — LIPASE, BLOOD: Lipase: 30 U/L (ref 11–51)

## 2017-03-02 LAB — D-DIMER, QUANTITATIVE: D-Dimer, Quant: 0.34 ug/mL-FEU (ref 0.00–0.50)

## 2017-03-02 MED ORDER — NITROGLYCERIN 0.4 MG SL SUBL: 0.4000 mg | SUBLINGUAL_TABLET | SUBLINGUAL | Status: DC | PRN

## 2017-03-02 MED ORDER — ZOLPIDEM TARTRATE 5 MG PO TABS
5.0000 mg | ORAL_TABLET | Freq: Every evening | ORAL | Status: DC | PRN
  Administered 2017-03-02: 5 mg via ORAL
  Filled 2017-03-02: qty 1

## 2017-03-02 MED ORDER — HYDROMORPHONE HCL 1 MG/ML IJ SOLN
0.5000 mg | Freq: Once | INTRAMUSCULAR | Status: AC
  Administered 2017-03-02: 0.5 mg via INTRAVENOUS
  Filled 2017-03-02: qty 1

## 2017-03-02 MED ORDER — FUROSEMIDE 10 MG/ML IJ SOLN
40.0000 mg | Freq: Once | INTRAMUSCULAR | Status: AC
  Administered 2017-03-02: 40 mg via INTRAVENOUS
  Filled 2017-03-02: qty 4

## 2017-03-02 MED ORDER — ASPIRIN 300 MG RE SUPP: 300.0000 mg | RECTAL | Status: AC

## 2017-03-02 MED ORDER — HEPARIN SODIUM (PORCINE) 5000 UNIT/ML IJ SOLN
5000.0000 [IU] | Freq: Three times a day (TID) | INTRAMUSCULAR | Status: DC
  Administered 2017-03-03: 5000 [IU] via SUBCUTANEOUS
  Filled 2017-03-02: qty 1

## 2017-03-02 MED ORDER — ASPIRIN EC 81 MG PO TBEC
81.0000 mg | DELAYED_RELEASE_TABLET | Freq: Every day | ORAL | Status: DC
Start: 2017-03-03 — End: 2017-03-03
  Administered 2017-03-03: 81 mg via ORAL
  Filled 2017-03-02: qty 1

## 2017-03-02 MED ORDER — INSULIN ASPART 100 UNIT/ML ~~LOC~~ SOLN: 0.0000 [IU] | Freq: Every day | SUBCUTANEOUS | Status: DC

## 2017-03-02 MED ORDER — SODIUM CHLORIDE 0.9 % IV BOLUS (SEPSIS)
1000.0000 mL | Freq: Once | INTRAVENOUS | Status: AC
  Administered 2017-03-02: 1000 mL via INTRAVENOUS

## 2017-03-02 MED ORDER — KETOROLAC TROMETHAMINE 15 MG/ML IJ SOLN
15.0000 mg | Freq: Once | INTRAMUSCULAR | Status: AC
  Administered 2017-03-02: 15 mg via INTRAVENOUS
  Filled 2017-03-02: qty 1

## 2017-03-02 MED ORDER — OXYCODONE-ACETAMINOPHEN 5-325 MG PO TABS
2.0000 | ORAL_TABLET | Freq: Once | ORAL | Status: AC
  Administered 2017-03-02: 2 via ORAL
  Filled 2017-03-02: qty 2

## 2017-03-02 MED ORDER — INSULIN ASPART 100 UNIT/ML ~~LOC~~ SOLN: 0.0000 [IU] | Freq: Three times a day (TID) | SUBCUTANEOUS | Status: DC

## 2017-03-02 MED ORDER — ASPIRIN 81 MG PO CHEW: 324.0000 mg | CHEWABLE_TABLET | ORAL | Status: AC

## 2017-03-02 MED ORDER — ACETAMINOPHEN 325 MG PO TABS: 650.0000 mg | ORAL_TABLET | ORAL | Status: DC | PRN

## 2017-03-02 MED ORDER — ONDANSETRON HCL 4 MG/2ML IJ SOLN: 4.0000 mg | Freq: Four times a day (QID) | INTRAMUSCULAR | Status: DC | PRN

## 2017-03-02 MED ORDER — ONDANSETRON HCL 4 MG/2ML IJ SOLN
4.0000 mg | Freq: Once | INTRAMUSCULAR | Status: AC
  Administered 2017-03-02: 4 mg via INTRAVENOUS
  Filled 2017-03-02: qty 2

## 2017-03-02 NOTE — ED Triage Notes (Signed)
Pt from home with chest pain that started this morning. Pt took 3 nitros with some relief and 324mg  ASA. Pt is also having some shoulder pain on the left side. Pt has history of MI

## 2017-03-02 NOTE — ED Provider Notes (Signed)
[facility] EMERGENCY DEPARTMENT Provider Note   CSN: [id number] Arrival date & time: 03/02/17  3662     History   Chief Complaint Chief Complaint  Patient presents with  . Chest Pain    HPI [name] [name] is a 56 y.o. female.  HPI   56 year old female with extensive past medical history as below including coronary artery disease with MI x2 status post CABG who presents with left-sided upper chest pain.  The patient reports that over the last several days, she has had a sharp, pleuritic, but also persistent and occasional dull left upper chest pain.  The pain is worse with deep inspiration as well as exertion.  She has had associated moderate shortness of breath.  No cough.  No leg swelling.  She states that her symptoms do feel similar to her last myocardial infarction last year, so she subsequently presents.  She took 325 of aspirin and nitroglycerin, which did not help her pain.  Denies any history of blood clots.  Denies any recent immobilization.  She has been compliant with her aspirin.  Past Medical History:  Diagnosis Date  . Acute ST elevation myocardial infarction (STEMI) involving right coronary artery ([place]) 10/04/2015  . Anxiety   . Anxiety and depression   . CAD (coronary artery disease)    a. CABG 06/2015: LIMA-LAD, SVG-RCA, SVG-OM2 b. STEMI s/p PCI to distal RCA lesion with a small Promus Premier stent. ( patent LIMA to the LAD, occluded SVG to OM, occluded SVG to RCA.)  . CAD (coronary artery disease) of artery bypass graft; occluded SVG to RCA and occluded SVG to OM 10/08/2015  . Empty sella ([place])   . Hidradenitis axillaris   . Hyperlipemia   . Hypertension   . Kidney stones   . Lupus dx'd 2008   "they think I have this; have to get retested" (06/12/2015)  . Obesity (BMI 30.0-34.9)   . Ovarian cyst   . Pneumonia X 2  . PONV (postoperative nausea and vomiting)   . Rheumatoid aortitis   . Rheumatoid arthritis ([place])   . S/P angioplasty with  stent; 10/04/15 to distal RCA lesion. with Promus premier. 10/08/2015  . Scoliosis   . Stroke [facility]) March 2014   left MCA branches; "they say I have them all the time", denies residual on 06/12/2015  . Tobacco abuse   . Type II diabetes mellitus [facility])     Patient Active Problem List   Diagnosis Date Noted  . Chest pain in adult 03/02/2017  . Hypoxia   . Acute on chronic diastolic CHF (congestive heart failure) ([place])   . Hx of CABG   . S/P angioplasty with stent; 10/04/15 to distal RCA lesion. with Promus premier. 10/08/2015  . History of ST elevation myocardial infarction (STEMI) 10/04/2015  . Superficial incisional surgical site infection, medial left lower extremity 07/10/2015  . Dyslipidemia-LDL 197, goal < 70 06/13/2015  . Chest pain with moderate risk of acute coronary syndrome   . Tobacco abuse   . Type 2 diabetes mellitus, uncontrolled ([place]) 06/04/2012  . Lupus ([place]) 06/03/2012  . History of stroke 06/03/2012  . Empty sella syndrome ([place]) 06/03/2012  . HTN (hypertension) 06/03/2012  . Rheumatoid arthritis ([place]) 06/03/2012  . Hidradenitis axillaris 06/03/2012  . Obesity (BMI 30.0-34.9)     Past Surgical History:  Procedure Laterality Date  . ABDOMINAL HYSTERECTOMY    . BREAST BIOPSY Left   . CARDIAC CATHETERIZATION N/A 06/13/2015   Procedure: Left Heart Cath  and Coronary Angiography;  Surgeon: Peter M Martinique, MD;  Location: [facility] CV LAB;  Service: Cardiovascular;  Laterality: N/A;  . CARDIAC CATHETERIZATION N/A 10/04/2015   Procedure: Left Heart Cath and Coronary Angiography;  Surgeon: [name], MD;  Location: [facility] CV LAB;  Service: Cardiovascular;  Laterality: N/A;  . CARDIAC CATHETERIZATION N/A 10/04/2015   Procedure: Coronary Stent Intervention;  Surgeon: [name], MD;  Location: [facility] CV LAB;  Service: Cardiovascular;  Laterality: N/A;  . CORONARY ARTERY BYPASS GRAFT N/A 06/19/2015   Procedure: CORONARY ARTERY BYPASS GRAFTING (CABG)  TIMES FOUR USING LEFT INTERNAL MAMMARY, RIGHT SAPHENOUS LEG VEIN AND CRYO SAPHENOUS VEIN;  Surgeon: [name], MD;  Location: [facility];  Service: Open Heart Surgery;  Laterality: N/A;  . DILATION AND CURETTAGE OF UTERUS    . TEE WITHOUT CARDIOVERSION N/A 06/19/2015   Procedure: TRANSESOPHAGEAL ECHOCARDIOGRAM (TEE);  Surgeon: [name], MD;  Location: [facility];  Service: Open Heart Surgery;  Laterality: N/A;  . TONSILLECTOMY  1960s  . TUBAL LIGATION      OB History    No data available       Home Medications    Prior to Admission medications   Medication Sig Start Date End Date Taking? Authorizing Provider  acetaminophen (TYLENOL) 325 MG tablet Take 2 tablets (650 mg total) by mouth every 4 (four) hours as needed for headache or mild pain. 10/08/15  Yes Isaiah Serge, NP  aspirin EC 81 MG EC tablet Take 1 tablet (81 mg total) by mouth daily. 06/25/15  Yes Lars Pinks M, [name]  atorvastatin (LIPITOR) 80 MG tablet TAKE 1 TABLET BY MOUTH DAILY AT 6PM 11/18/16  Yes Martinique, Peter M, MD  BIOTIN PO Take 1 tablet by mouth daily.   Yes [provider]  cetirizine (ZYRTEC) 10 MG tablet Take 10 mg by mouth daily. 02/18/17  Yes [provider]  Cholecalciferol (VITAMIN D3) 50000 units CAPS Take 50,000 Units by mouth once a week. 04/09/15  Yes [provider]  INVOKANA 300 MG TABS tablet Take 300 mg by mouth daily. 06/05/15  Yes [provider]  Iron-Vitamins (GERITOL COMPLETE PO) Take 1 tablet by mouth daily.   Yes [provider]  isosorbide mononitrate (IMDUR) 30 MG 24 hr tablet Take 0.5 tablets (15 mg total) by mouth daily. 11/18/16  Yes Martinique, Peter M, MD  JANUVIA 100 MG tablet Take 100 mg by mouth daily. 04/06/15  Yes [provider]  metFORMIN (GLUCOPHAGE) 1000 MG tablet Take 1,000 mg by mouth 2 (two) times daily. 04/06/15  Yes [provider]  Metoprolol Tartrate 37.5 MG TABS TAKE 1 TABLET TWICE DAILY Patient taking  differently: TAKE 37.5MG  BY MOUTH TWICE DAILY 02/16/17  Yes Martinique, Peter M, MD  nitroGLYCERIN (NITROSTAT) 0.4 MG SL tablet Place 1 tablet (0.4 mg total) under the tongue every 5 (five) minutes as needed for chest pain. MAX 3 doses 11/18/16  Yes Martinique, Peter M, MD  ranitidine (ZANTAC) 150 MG tablet Take 150 mg by mouth 2 (two) times daily. 02/18/17  Yes [provider]  ticagrelor (BRILINTA) 90 MG TABS tablet Take 1 tablet (90 mg total) by mouth 2 (two) times daily. Patient not taking: Reported on 03/02/2017 05/06/16   Martinique, Peter M, MD    Family History Family History  Problem Relation Age of Onset  . Diabetes Mother   . Heart failure Mother   . Breast cancer Mother   . Stroke Maternal Aunt   .  Diabetes Unknown        maternal  . Breast cancer Unknown   . Alzheimer's disease Unknown        maternal  . Depression Son     Social History Social History   Tobacco Use  . Smoking status: Former Smoker    Packs/day: 0.50    Years: 36.00    Pack years: 18.00    Types: Cigarettes    Last attempt to quit: 06/12/2014    Years since quitting: 2.7  . Smokeless tobacco: Never Used  Substance Use Topics  . Alcohol use: No    Alcohol/week: 0.0 oz  . Drug use: No     Allergies   Patient has no known allergies.   Review of Systems Review of Systems  Constitutional: Positive for fatigue.  Respiratory: Positive for chest tightness and shortness of breath.   Cardiovascular: Positive for chest pain.  All other systems reviewed and are negative.    Physical Exam Updated Vital Signs BP 105/65   Pulse 79   Temp 98.2 F (36.8 C)   Resp 14   Ht 5\' 6"  (1.676 m)   Wt 95.3 kg (210 lb)   SpO2 93%   BMI 33.89 kg/m   Physical Exam  Constitutional: She is oriented to person, place, and time. She appears well-developed and well-nourished. No distress.  HENT:  Head: Normocephalic and atraumatic.  Eyes: Conjunctivae are normal.  Neck: Neck supple.  Cardiovascular: Normal  rate, regular rhythm and normal heart sounds. Exam reveals no friction rub.  No murmur heard. Pulmonary/Chest: Effort normal. No respiratory distress. She has no wheezes. She has rales in the right lower field and the left lower field.  Abdominal: She exhibits no distension.  Musculoskeletal: She exhibits no edema.  Mild edema to the bilateral lower extremities  Neurological: She is alert and oriented to person, place, and time. She exhibits normal muscle tone.  Skin: Skin is warm. Capillary refill takes less than 2 seconds.  Psychiatric: She has a normal mood and affect.  Nursing note and vitals reviewed.    ED Treatments / Results  Labs (all labs ordered are listed, but only abnormal results are displayed) Labs Reviewed  BASIC METABOLIC PANEL - Abnormal; Notable for the following components:      Result Value   Glucose, Bld 106 (*)    All other components within normal limits  CBC - Abnormal; Notable for the following components:   RBC 5.35 (*)    Hemoglobin 15.3 (*)    HCT 47.1 (*)    RDW 16.4 (*)    All other components within normal limits  CBC - Abnormal; Notable for the following components:   WBC 11.5 (*)    RBC 5.24 (*)    Hemoglobin 15.1 (*)    RDW 16.0 (*)    All other components within normal limits  BRAIN NATRIURETIC PEPTIDE  D-DIMER, QUANTITATIVE (NOT AT [facility])  LIPASE, BLOOD  TROPONIN I  TROPONIN I  HIV ANTIBODY (ROUTINE TESTING)  CREATININE, SERUM  I-STAT TROPONIN, ED  I-STAT BETA HCG BLOOD, ED ([facility], [facility], [facility] ONLY)    EKG  EKG Interpretation  Date/Time:  Monday March 02 2017 07:37:37 EST Ventricular Rate:  80 PR Interval:    QRS Duration: 90 QT Interval:  442 QTC Calculation: 510 R Axis:   0 Text Interpretation:  Sinus rhythm Probable left atrial enlargement Indeterminate axis Low voltage, precordial leads RSR' in V1 or V2, right VCD or RVH Borderline T abnormalities,  anterior leads Borderline prolonged QT interval Sicne last EKG, STEMI has  resolved Q waves in inferior leads are less evident Confirmed by Duffy Bruce [phone]) on 03/02/2017 7:47:23 AM Also confirmed by Duffy Bruce [phone]), editor Laurena Spies [phone])  on 03/02/2017 11:25:22 AM       Radiology Dg Chest 2 View  Result Date: 03/02/2017 CLINICAL DATA:  Chest pain and left shoulder pain EXAM: CHEST  2 VIEW COMPARISON:  07/18/2015 FINDINGS: Mild-to-moderate enlargement of the cardiopericardial silhouette. Prior CABG. Mild upper zone pulmonary vascular prominence with questionable Kerley B-lines peripherally at the left lung base. No pleural effusion. No discrete airspace opacities. Calcifications projecting over the anterior spine on the lateral projection probably represent the patient's prior left renal calculi. IMPRESSION: 1. Mild to moderate enlargement of the cardiopericardial silhouette with pulmonary venous hypertension and subtle Kerley B-lines at the left lung base suspicious for early interstitial edema. No overt airspace edema. Electronically Signed   By: Van Clines M.D.   On: 03/02/2017 08:21    Procedures Procedures (including critical care time)  Medications Ordered in ED Medications  aspirin chewable tablet 324 mg (324 mg Oral Not Given 03/02/17 1338)    Or  aspirin suppository 300 mg ( Rectal See Alternative 03/02/17 1338)  aspirin EC tablet 81 mg (not administered)  nitroGLYCERIN (NITROSTAT) SL tablet 0.4 mg (not administered)  acetaminophen (TYLENOL) tablet 650 mg (not administered)  ondansetron (ZOFRAN) injection 4 mg (not administered)  heparin injection 5,000 Units (not administered)  zolpidem (AMBIEN) tablet 5 mg (not administered)  insulin aspart (novoLOG) injection 0-15 Units (not administered)  insulin aspart (novoLOG) injection 0-5 Units (not administered)  furosemide (LASIX) injection 40 mg (not administered)  sodium chloride 0.9 % bolus 1,000 mL (0 mLs Intravenous Stopped 03/02/17 0923)  HYDROmorphone (DILAUDID)  injection 0.5 mg (0.5 mg Intravenous Given 03/02/17 0907)  ondansetron (ZOFRAN) injection 4 mg (4 mg Intravenous Given 03/02/17 0904)  furosemide (LASIX) injection 40 mg (40 mg Intravenous Given 03/02/17 0914)  ketorolac (TORADOL) 15 MG/ML injection 15 mg (15 mg Intravenous Given 03/02/17 1151)  oxyCODONE-acetaminophen (PERCOCET/ROXICET) 5-325 MG per tablet 2 tablet (2 tablets Oral Given 03/02/17 1151)     Initial Impression / Assessment and Plan / ED Course  I have reviewed the triage vital signs and the nursing notes.  Pertinent labs & imaging results that were available during my care of the patient were reviewed by me and considered in my medical decision making (see chart for details).     56 year old female with history of CABG and CAD here with atypical chest pain, though this is similar to her previous MIs.  She is borderline hypoxic and tachypneic on exam.  Chest x-ray concerning for new pulmonary edema.  She does not have a known history of CHF per her report, but she does have some inferolateral hypokinesis on her last echocardiogram with diastolic dysfunction.  Her symptoms are improving after nitro and aspirin.  Will consult cardiology.  Cardiology has evaluated.  Patient feels markedly improved after diuresing 1.5 L in the ED. Current plan of care is to follow-up delta troponin and reassess.  Patient able to ambulate with lowest sat of 90%.  However, in the room, patient has regular, good waveform sats of 88-89.  She endorses persistent severe orthopnea.  Given her ongoing orthopnea and dyspnea with hypoxia, will admit.  Final Clinical Impressions(s) / ED Diagnoses   Final diagnoses:  Acute on chronic combined systolic and diastolic congestive heart failure ([place])  Duffy Bruce, MD 03/02/17 [phone]

## 2017-03-02 NOTE — ED Notes (Signed)
Gave pt a [place] sandwich and something to drink

## 2017-03-02 NOTE — ED Notes (Signed)
Placed pt on 2L for sats in the 80's, pt sats came back up to low 90's

## 2017-03-02 NOTE — H&P (Addendum)
Cardiology Admission History and Physical:   Patient ID: [name] [name] [name]; MRN: [id number]; DOB: 07/07/1960   Admission date: 03/02/2017  Primary Care Provider: Leeroy Cha, MD Primary Cardiologist: Dr [name] [name]  Chief Complaint:  Lt shoulder and Lt lateral chest pain with inspiration  Patient Profile:   [name] [name] [name] is a 56 y.o. female with a history of CAD, DM, HTN, and HLD, seen in the ED with complaints of Lt shoulder and Lt lateral chest pain.  History of Present Illness:   [name] [name] [name] is a pleasant 56 y/o A female who we saw in March 2017 when she presented with a NSTEMI. Cath revealed 3V CAD and she underwent CABG x 3 on 07/05/15 with a LIMA-LAD, SVG-OM, SVG_RCA. Her LVF was normal. In July 2017 she presented with a STEMI. Cath revealed occlusion of her SVG-OM and SVG-RCA with distal RCA occlusion. She was treated with PCI and DES to her dRCA. She has done well since on medical Rx. LOV was in July 2018, Brilinta stopped then.  This past Friday she had a colonoscopy with polypectomy (2) by Dr [name] [name] [name]). She has had some gas pains over the weekend. Last night she developed Lt shoulder discomfort and Lt lateral chest pain, worse with inspiration. She denies any SOB or recent DOE.    Past Medical History:  Diagnosis Date  . Acute ST elevation myocardial infarction (STEMI) involving right coronary artery ([place]) 10/04/2015  . Anxiety   . Anxiety and depression   . CAD (coronary artery disease)    a. CABG 06/2015: LIMA-LAD, SVG-RCA, SVG-OM2 b. STEMI s/p PCI to distal RCA lesion with a small Promus Premier stent. ( patent LIMA to the LAD, occluded SVG to OM, occluded SVG to RCA.)  . CAD (coronary artery disease) of artery bypass graft; occluded SVG to RCA and occluded SVG to OM 10/08/2015  . Empty sella ([place])   . Hidradenitis axillaris   . Hyperlipemia   . Hypertension   . Kidney stones   . Lupus dx'd 2008   "they think I have this; have to get retested" (06/12/2015)    . Obesity (BMI 30.0-34.9)   . Ovarian cyst   . Pneumonia X 2  . PONV (postoperative nausea and vomiting)   . Rheumatoid aortitis   . Rheumatoid arthritis ([place])   . S/P angioplasty with stent; 10/04/15 to distal RCA lesion. with Promus premier. 10/08/2015  . Scoliosis   . Stroke [facility]) March 2014   left MCA branches; "they say I have them all the time", denies residual on 06/12/2015  . Tobacco abuse   . Type II diabetes mellitus ([place])     Past Surgical History:  Procedure Laterality Date  . ABDOMINAL HYSTERECTOMY    . BREAST BIOPSY Left   . CARDIAC CATHETERIZATION N/A 06/13/2015   Procedure: Left Heart Cath and Coronary Angiography;  Surgeon: Macsen Nuttall M Martinique, MD;  Location: [place] [facility];  Service: Cardiovascular;  Laterality: N/A;  . CARDIAC CATHETERIZATION N/A 10/04/2015   Procedure: Left Heart Cath and Coronary Angiography;  Surgeon: [name], MD;  Location: [place] [facility];  Service: Cardiovascular;  Laterality: N/A;  . CARDIAC CATHETERIZATION N/A 10/04/2015   Procedure: Coronary Stent Intervention;  Surgeon: [name], MD;  Location: [place] [facility];  Service: Cardiovascular;  Laterality: N/A;  . CORONARY ARTERY BYPASS GRAFT N/A 06/19/2015   Procedure: CORONARY ARTERY BYPASS GRAFTING (CABG) TIMES FOUR USING LEFT INTERNAL MAMMARY, RIGHT SAPHENOUS LEG VEIN AND CRYO SAPHENOUS VEIN;  Surgeon: [name], MD;  Location: [place];  Service: Open Heart Surgery;  Laterality: N/A;  . DILATION AND CURETTAGE OF UTERUS    . TEE WITHOUT CARDIOVERSION N/A 06/19/2015   Procedure: TRANSESOPHAGEAL ECHOCARDIOGRAM (TEE);  Surgeon: [name], MD;  Location: [place];  Service: Open Heart Surgery;  Laterality: N/A;  . TONSILLECTOMY  1960s  . TUBAL LIGATION       Medications Prior to Admission: Prior to Admission medications   Medication Sig Start Date End Date Taking? Authorizing Provider  acetaminophen (TYLENOL) 325 MG tablet Take 2 tablets (650 mg total) by mouth every  4 (four) hours as needed for headache or mild pain. 10/08/15  Yes Isaiah Serge, NP  aspirin EC 81 MG EC tablet Take 1 tablet (81 mg total) by mouth daily. 06/25/15  Yes Lars Pinks M, [name]  atorvastatin (LIPITOR) 80 MG tablet TAKE 1 TABLET BY MOUTH DAILY AT 6PM 11/18/16  Yes [name] [name], Gualberto Wahlen M, MD  BIOTIN PO Take 1 tablet by mouth daily.   Yes [provider]  cetirizine (ZYRTEC) 10 MG tablet Take 10 mg by mouth daily. 02/18/17  Yes [provider]  Cholecalciferol (VITAMIN D3) 50000 units CAPS Take 50,000 Units by mouth once a week. 04/09/15  Yes [provider]  INVOKANA 300 MG TABS tablet Take 300 mg by mouth daily. 06/05/15  Yes [provider]  Iron-Vitamins (GERITOL COMPLETE PO) Take 1 tablet by mouth daily.   Yes [provider]  isosorbide mononitrate (IMDUR) 30 MG 24 hr tablet Take 0.5 tablets (15 mg total) by mouth daily. 11/18/16  Yes [name] [name], Burna Atlas M, MD  JANUVIA 100 MG tablet Take 100 mg by mouth daily. 04/06/15  Yes [provider]  metFORMIN (GLUCOPHAGE) 1000 MG tablet Take 1,000 mg by mouth 2 (two) times daily. 04/06/15  Yes [provider]  Metoprolol Tartrate 37.5 MG TABS TAKE 1 TABLET TWICE DAILY Patient taking differently: TAKE 37.5MG  BY MOUTH TWICE DAILY 02/16/17  Yes [name] [name], Davieon Stockham M, MD  nitroGLYCERIN (NITROSTAT) 0.4 MG SL tablet Place 1 tablet (0.4 mg total) under the tongue every 5 (five) minutes as needed for chest pain. MAX 3 doses 11/18/16  Yes [name] [name], Jerett Odonohue M, MD  ranitidine (ZANTAC) 150 MG tablet Take 150 mg by mouth 2 (two) times daily. 02/18/17  Yes [provider]  ticagrelor (BRILINTA) 90 MG TABS tablet Take 1 tablet (90 mg total) by mouth 2 (two) times daily. Patient not taking: Reported on 03/02/2017 05/06/16   [name] [name], Okey Zelek M, MD     Allergies:   No Known Allergies  Social History:   Social History   Socioeconomic History  . Marital status: Widowed    Spouse name: Not on file  . Number of  children: 2  . Years of education: college  . Highest education level: Not on file  Social Needs  . Financial resource strain: Not on file  . Food insecurity - worry: Not on file  . Food insecurity - inability: Not on file  . Transportation needs - medical: Not on file  . Transportation needs - non-medical: Not on file  Occupational History  . Occupation: Disability  Tobacco Use  . Smoking status: Former Smoker    Packs/day: 0.50    Years: 36.00    Pack years: 18.00    Types: Cigarettes    Last attempt to quit: 06/12/2014    Years since quitting: 2.7  . Smokeless tobacco: Never Used  Substance and Sexual Activity  .  Alcohol use: No    Alcohol/week: 0.0 oz  . Drug use: No  . Sexual activity: No  Other Topics Concern  . Not on file  Social History Narrative   Patient currently lives at home with her disabled husband who was shot in 2009 and he is total care. She lives with her son and daughter and there is an 8 that comes in. Patient currently is on disability   She occasionally drinks caffeine.     Family History:   The patient's family history includes Alzheimer's disease in her unknown relative; Breast cancer in her mother and unknown relative; Depression in her son; Diabetes in her mother and unknown relative; Heart failure in her mother; Stroke in her maternal aunt.    ROS:  Please see the history of present illness.  All other ROS reviewed and negative.     Physical Exam/Data:   Vitals:   03/02/17 0930 03/02/17 0946 03/02/17 1000 03/02/17 1015  BP: (!) 148/95   (!) 153/107  Pulse: 81 82 83 82  Resp: 18 19 18 13   Temp:      SpO2: (!) 89% 94% 94% 96%  Weight:      Height:        Intake/Output Summary (Last 24 hours) at 03/02/2017 1118 Last data filed at 03/02/2017 1027 Gross per 24 hour  Intake 1000 ml  Output 800 ml  Net 200 ml   Filed Weights   03/02/17 0738  Weight: 210 lb (95.3 kg)   Body mass index is 33.89 kg/m.  General:  Well nourished, well  developed, in no acute distress HEENT: normal Lymph: no adenopathy Neck: no JVD Endocrine:  No thryomegaly Vascular: No carotid bruits; FA pulses 2+ bilaterally without bruits  Cardiac:  normal S1, S2; RRR; no murmur or rub Lungs:  clear to auscultation bilaterally, no wheezing, rhonchi or rales  Abd: soft, nontender, no hepatomegaly  Ext: no edema Musculoskeletal:  No deformities, BUE and BLE strength normal and equal Skin: warm and dry  Neuro:  CNs 2-12 intact, no focal abnormalities noted Psych:  Normal affect    EKG:  The ECG that was done 03/02/17 was personally reviewed and demonstrates NSR, incomplete RBBB, NSST changes  Relevant CV Studies: Echo 10/05/15- Impressions:  - Compared to a recent echo in 06/2015, the LVEF is stable at   55-60%, however, there is inferior hypokinesis and relatively   hyperdynamic anterior wall motion.   Laboratory Data:  Chemistry Recent Labs  Lab 03/02/17 0743  NA 142  K 4.9  CL 107  CO2 24  GLUCOSE 106*  BUN 14  CREATININE 0.82  CALCIUM 9.7  GFRNONAA >60  GFRAA >60  ANIONGAP 11    No results for input(s): PROT, ALBUMIN, AST, ALT, ALKPHOS, BILITOT in the last 168 hours. Hematology Recent Labs  Lab 03/02/17 0743  WBC 10.2  RBC 5.35*  HGB 15.3*  HCT 47.1*  MCV 88.0  MCH 28.6  MCHC 32.5  RDW 16.4*  PLT 248   Cardiac Enzymes Recent Labs  Lab 03/02/17 0743  TROPONINI <0.03    Recent Labs  Lab 03/02/17 0755  TROPIPOC 0.00    BNP Recent Labs  Lab 03/02/17 0743  BNP 79.5    DDimer  Recent Labs  Lab 03/02/17 0743  DDIMER 0.34    Radiology/Studies:  Dg Chest 2 View  Result Date: 03/02/2017 CLINICAL DATA:  Chest pain and left shoulder pain EXAM: CHEST  2 VIEW COMPARISON:  07/18/2015 FINDINGS: Mild-to-moderate enlargement  of the cardiopericardial silhouette. Prior CABG. Mild upper zone pulmonary vascular prominence with questionable Kerley B-lines peripherally at the left lung base. No pleural effusion.  No discrete airspace opacities. Calcifications projecting over the anterior spine on the lateral projection probably represent the patient's prior left renal calculi. IMPRESSION: 1. Mild to moderate enlargement of the cardiopericardial silhouette with pulmonary venous hypertension and subtle Kerley B-lines at the left lung base suspicious for early interstitial edema. No overt airspace edema. Electronically Signed   By: Van Clines M.D.   On: 03/02/2017 08:21    Assessment and Plan:   Chest pain- Atypical for [place] or CHF, probably referred pain from retained intestinal air from her colonoscopy.   CAD- S/p CABG x 3 in April 2017, s/p RCA PCI in July 2017  NIDDM- On Glucophage and Januvia  HLD- Last LDL 79   Plan: will check a second Troponin, if negative she can probably go home. Dr [name] [name] to see.    Severity of Illness: The appropriate patient status for this patient is OBSERVATION. Observation status is judged to be reasonable and necessary in order to provide the required intensity of service to ensure the patient's safety. The patient's presenting symptoms, physical exam findings, and initial radiographic and laboratory data in the context of their medical condition is felt to place them at decreased risk for further clinical deterioration. Furthermore, it is anticipated that the patient will be medically stable for discharge from the hospital within 2 midnights of admission. The following factors support the patient status of observation.   " The patient's presenting symptoms include chest pain. " The physical exam findings include . " The initial radiographic and laboratory data are unremarkable.     For questions or updates, please contact [place] Please consult [email or web address] for contact info under Cardiology/STEMI.    Signed, Kerin Ransom, [name]  03/02/2017 11:18 AM   Patient seen and examined and history reviewed. Agree with above findings and plan. Mrs.  [name] [name] is well known to me. History of CAD s/p CABG with early graft failure and subsequent PCI. Presents to ED with c/o left shoulder pain and pain in left lateral ribs. Pain in ribs worse with deep breath. She is s/p colonoscopy and polypectomy on Friday. She is passing a lot of gas. No other chest pain. On exam she is in no distress. Lungs are clear She does have pain on palpation in left anterior and lateral ribs  CV RRR without gallop or murmur No edema.  CXR reviewed - reported early edema but I am not impressed. BNP normal. Clear lungs. Ecg shows nonspecific TWA without acute change. I have personally reviewed and interpreted this study.  Troponin negative x 2.   My impression is that this is more of a musculoskeletal pain. It is somewhat difficult since her prior cardiac pain was atypical. Given negative assessment in the ED I think we should treat her conservatively with analgesics. Continue current cardiac meds. I would not recommend invasive evaluation with recent polypectomy and with atypical presentation.   [name] [name] [name], [place] 03/02/2017 12:14 PM  Returned to evaluate patient in the ED. Repeat troponin negative. Patient has persistent hypoxia on RA. Denies recent cough or asthma. Maybe slight swelling in feet. No orthopnea or PND. CXR suggests mild edema but BNP normal. D-dimer is normal so PE is unlikely. She has received IV lasix in the ED with excellent response negative 800 cc so far. Will admit overnight observation. Diuresis. Check Echo.   [name] Salina Martinique  MD, [facility]  03/02/2017 2:10 PM

## 2017-03-02 NOTE — Plan of Care (Signed)
VSS and WNL. No c/o pain or signs of distress 

## 2017-03-02 NOTE — ED Notes (Signed)
Checked pt pulse ox while ambulating; pt O2 sat 94% at start, 92% while ambulating, 96% upon returning to bedside.

## 2017-03-02 NOTE — ED Notes (Signed)
Patient transported to X-ray 

## 2017-03-03 ENCOUNTER — Observation Stay (HOSPITAL_BASED_OUTPATIENT_CLINIC_OR_DEPARTMENT_OTHER): Payer: Medicare HMO

## 2017-03-03 DIAGNOSIS — R079 Chest pain, unspecified: Secondary | ICD-10-CM

## 2017-03-03 DIAGNOSIS — I5043 Acute on chronic combined systolic (congestive) and diastolic (congestive) heart failure: Secondary | ICD-10-CM | POA: Diagnosis not present

## 2017-03-03 DIAGNOSIS — M069 Rheumatoid arthritis, unspecified: Secondary | ICD-10-CM | POA: Diagnosis not present

## 2017-03-03 DIAGNOSIS — Z1211 Encounter for screening for malignant neoplasm of colon: Secondary | ICD-10-CM | POA: Diagnosis not present

## 2017-03-03 DIAGNOSIS — M329 Systemic lupus erythematosus, unspecified: Secondary | ICD-10-CM | POA: Diagnosis not present

## 2017-03-03 DIAGNOSIS — D126 Benign neoplasm of colon, unspecified: Secondary | ICD-10-CM | POA: Diagnosis not present

## 2017-03-03 DIAGNOSIS — E785 Hyperlipidemia, unspecified: Secondary | ICD-10-CM | POA: Diagnosis not present

## 2017-03-03 DIAGNOSIS — I2510 Atherosclerotic heart disease of native coronary artery without angina pectoris: Secondary | ICD-10-CM | POA: Diagnosis not present

## 2017-03-03 DIAGNOSIS — I110 Hypertensive heart disease with heart failure: Secondary | ICD-10-CM | POA: Diagnosis not present

## 2017-03-03 DIAGNOSIS — E119 Type 2 diabetes mellitus without complications: Secondary | ICD-10-CM | POA: Diagnosis not present

## 2017-03-03 DIAGNOSIS — E669 Obesity, unspecified: Secondary | ICD-10-CM | POA: Diagnosis not present

## 2017-03-03 DIAGNOSIS — Z951 Presence of aortocoronary bypass graft: Secondary | ICD-10-CM | POA: Diagnosis not present

## 2017-03-03 LAB — BASIC METABOLIC PANEL
Anion gap: 13 (ref 5–15)
BUN: 15 mg/dL (ref 6–20)
CO2: 23 mmol/L (ref 22–32)
Calcium: 9.6 mg/dL (ref 8.9–10.3)
Chloride: 101 mmol/L (ref 101–111)
Creatinine, Ser: 0.99 mg/dL (ref 0.44–1.00)
GFR calc Af Amer: >60 mL/min (ref >60)
GFR calc non Af Amer: >60 mL/min (ref >60)
Glucose, Bld: 173 mg/dL — ABNORMAL HIGH (ref 65–99)
Potassium: 3.7 mmol/L (ref 3.5–5.1)
Sodium: 137 mmol/L (ref 135–145)

## 2017-03-03 LAB — ECHOCARDIOGRAM COMPLETE
Ao-asc: 35 cm
Area-P 1/2: 3.28 cm2
E decel time: 229 msec
E/e' ratio: 10.51
FS: 23 % — AB (ref 28–44)
Height: 66 in
IVS/LV PW RATIO, ED: 0.73
LA ID, A-P, ES: 41 mm
LA diam end sys: 41 mm
LA diam index: 1.9 cm/m2
LA vol A4C: 16.6 ml
LA vol index: 11.3 mL/m2
LA vol: 24.3 mL
LV E/e' medial: 10.51
LV E/e'average: 10.51
LV PW d: 15 mm — AB (ref 0.6–1.1)
LV e' LATERAL: 5.68 cm/s
LVOT area: 2.54 cm2
LVOT diameter: 18 mm
Lateral S' vel: 7.54 cm/s
MV Dec: 229
MV pk A vel: 82.9 m/s
MV pk E vel: 59.7 m/s
P 1/2 time: 67 ms
TAPSE: 12.6 mm
TDI e' lateral: 5.68
TDI e' medial: 4.87
Weight: 3396.8 oz

## 2017-03-03 LAB — GLUCOSE, CAPILLARY
Glucose-Capillary: 115 mg/dL — ABNORMAL HIGH (ref 65–99)
Glucose-Capillary: 113 mg/dL — ABNORMAL HIGH (ref 65–99)

## 2017-03-03 LAB — CBC
HCT: 46.6 % — ABNORMAL HIGH (ref 36.0–46.0)
Hemoglobin: 15 g/dL (ref 12.0–15.0)
MCH: 28.1 pg (ref 26.0–34.0)
MCHC: 32.2 g/dL (ref 30.0–36.0)
MCV: 87.4 fL (ref 78.0–100.0)
Platelets: 265 10*3/uL (ref 150–400)
RBC: 5.33 MIL/uL — ABNORMAL HIGH (ref 3.87–5.11)
RDW: 16 % — ABNORMAL HIGH (ref 11.5–15.5)
WBC: 8.8 10*3/uL (ref 4.0–10.5)

## 2017-03-03 LAB — HIV ANTIBODY (ROUTINE TESTING W REFLEX): HIV Screen 4th Generation wRfx: NONREACTIVE

## 2017-03-03 NOTE — Discharge Summary (Signed)
Discharge Summary    Patient ID: [name] [name],  MRN: [id number], DOB/AGE: 58-Oct-1962 58 y.o.  Admit date: 03/02/2017 Discharge date: 03/03/2017  Primary Care Provider: Leeroy Cha Primary Cardiologist: Dr. [name]  Discharge Diagnoses    Principal Problem:   Chest pain with moderate risk of acute coronary syndrome Active Problems:   Lupus ([place])   History of stroke   HTN (hypertension)   Rheumatoid arthritis ([place])   Obesity (BMI 30.0-34.9)   Type 2 diabetes mellitus, uncontrolled ([place])   Dyslipidemia-LDL 197, goal < 70   S/P angioplasty with stent; 10/04/15 to distal RCA lesion. with Promus premier.   Hx of CABG   Chest pain in adult   Allergies No Known Allergies  Diagnostic Studies/Procedures    TTE 03/03/17  Study Conclusions  - Left ventricle: The cavity size was normal. Wall thickness was   increased in a pattern of mild LVH. Systolic function was mildly   reduced. The estimated ejection fraction was in the range of 45%   to 50%. Severe inferior hypokinesis to akinesis. Doppler   parameters are consistent with abnormal left ventricular   relaxation (grade 1 diastolic dysfunction). The E/e&' ratio is   between 8-15, suggesting indeterminate LV filling pressure. - Aortic valve: Trileaflet. Sclerosis without stenosis. There was   no regurgitation. - Mitral valve: Mildly thickened leaflets . There was trivial   regurgitation. - Left atrium: The atrium was normal in size. - Inferior vena cava: The vessel was normal in size. The   respirophasic diameter changes were in the normal range (>= 50%),   consistent with normal central venous pressure. _____________   History of Present Illness     [name] [name] is a pleasant 56 y/o A female who we saw in March 2017 when she presented with a NSTEMI. Cath revealed 3V CAD and she underwent CABG x 3 on 07/05/15 with a LIMA-LAD, SVG-OM, SVG_RCA. Her LVF was normal. In July 2017 she presented with a  STEMI. Cath revealed occlusion of her SVG-OM and SVG-RCA with distal RCA occlusion managed with PCI/DES to her dRCA. She has done well since on medical Rx. LOV was in July 2018, Brilinta stopped then.  This past Friday she had a colonoscopy with polypectomy (2) by Dr Ruthann Cancer [facility]). She has had some gas pains over the weekend. Overnight 12/16 she developed Lt shoulder discomfort and Lt lateral chest pain, worse with inspiration. She denies any SOB or recent DOE.     Hospital Course     Consultants: None  1. Chest pain - Troponin negative x2. EKG non-ischemic - Telemetry monitoring without events - Chest pain is reproducible, possibly 2/2 strained muscle from cough vs referred pain from retained intestinal air from colonoscopy - L shoulder pain resolved - Deferring invasive ischemic evaluation at this time  2. CAD: NSTEMI 05/2015 s/p CABG 06/2015 with LIMA-LAD, SVG-OM, SVG-RCA; STEMI 09/2015 (found to have occlusion of SVG-OM and SVG-RCA, and dRCA occlusion managed with PCI/DES to dRCA) - Continued ASA, atorvastatin, and nitroglycerin - Imdur and metoprolol resumed at discharge   3. Early interstitial edema on CXR - BNP 79.5 - O2 sat decreased to 84% on RA yesterday (patient reports sleeping at the time) - now stable at 95% on RA - S/p Lasix 40mg  IV 12/17 - net output negative 1.3L - TTE with slightly reduced EF (45-50%), and severe inferior hypokinesis to akinesis; grade 1 diastolic dysfunction. Personally reviewed by Dr. Martinique who noted TTE to be overall  unchanged from 09/2015 - noted basal inferior wall is thinner but consistent with prior infarct.  - Should consider outpatient sleep study to assess for OSA  4. DM - On ISS inpatient and titrate as needed - Can resume home Invokana, Januvia, and metformin at discharge   _____________  Discharge Vitals Blood pressure (!) 141/78, pulse 96, temperature 98.9 F (37.2 C), temperature source Oral, resp. rate 16, height 5\' 6"  (1.676  m), weight 212 lb 4.8 oz (96.3 kg), SpO2 95 %.  Filed Weights   03/02/17 0738 03/02/17 1525 03/03/17 0419  Weight: 210 lb (95.3 kg) 211 lb 14.4 oz (96.1 kg) 212 lb 4.8 oz (96.3 kg)    Labs & Radiologic Studies    CBC Recent Labs    03/02/17 1333 03/03/17 0557  WBC 11.5* 8.8  HGB 15.1* 15.0  HCT 45.9 46.6*  MCV 87.6 87.4  PLT 279 606   Basic Metabolic Panel Recent Labs    03/02/17 0743 03/02/17 1333 03/03/17 0557  NA 142  --  137  K 4.9  --  3.7  CL 107  --  101  CO2 24  --  23  GLUCOSE 106*  --  173*  BUN 14  --  15  CREATININE 0.82 1.01* 0.99  CALCIUM 9.7  --  9.6   Liver Function Tests No results for input(s): AST, ALT, ALKPHOS, BILITOT, PROT, ALBUMIN in the last 72 hours. Recent Labs    03/02/17 0743  LIPASE 30   Cardiac Enzymes Recent Labs    03/02/17 0743 03/02/17 1159  TROPONINI <0.03 <0.03   BNP Invalid input(s): POCBNP D-Dimer Recent Labs    03/02/17 0743  DDIMER 0.34   Hemoglobin A1C No results for input(s): HGBA1C in the last 72 hours. Fasting Lipid Panel No results for input(s): CHOL, HDL, LDLCALC, TRIG, CHOLHDL, LDLDIRECT in the last 72 hours. Thyroid Function Tests No results for input(s): TSH, T4TOTAL, T3FREE, THYROIDAB in the last 72 hours.  Invalid input(s): FREET3 _____________  Dg Chest 2 View  Result Date: 03/02/2017 CLINICAL DATA:  Chest pain and left shoulder pain EXAM: CHEST  2 VIEW COMPARISON:  07/18/2015 FINDINGS: Mild-to-moderate enlargement of the cardiopericardial silhouette. Prior CABG. Mild upper zone pulmonary vascular prominence with questionable Kerley B-lines peripherally at the left lung base. No pleural effusion. No discrete airspace opacities. Calcifications projecting over the anterior spine on the lateral projection probably represent the patient's prior left renal calculi. IMPRESSION: 1. Mild to moderate enlargement of the cardiopericardial silhouette with pulmonary venous hypertension and subtle Kerley B-lines  at the left lung base suspicious for early interstitial edema. No overt airspace edema. Electronically Signed   By: Van Clines M.D.   On: 03/02/2017 08:21   Disposition   Pt is being discharged home today in good condition.  Follow-up Plans & Appointments    Follow-up Information    Martinique, Peter M, MD Follow up on 04/02/2017.   Specialty:  Cardiology Why:  Please arrive at 7:45am for your 8:00am appointment.  Contact information: [address] [place] [address] [phone]          Discharge Instructions    Diet - low sodium heart healthy   Complete by:  As directed    Increase activity slowly   Complete by:  As directed       Discharge Medications   Allergies as of 03/03/2017   No Known Allergies     Medication List    STOP taking these medications  ticagrelor 90 MG Tabs tablet Commonly known as:  BRILINTA     TAKE these medications   acetaminophen 325 MG tablet Commonly known as:  TYLENOL Take 2 tablets (650 mg total) by mouth every 4 (four) hours as needed for headache or mild pain.   aspirin 81 MG EC tablet Take 1 tablet (81 mg total) by mouth daily.   atorvastatin 80 MG tablet Commonly known as:  LIPITOR TAKE 1 TABLET BY MOUTH DAILY AT 6PM   BIOTIN PO Take 1 tablet by mouth daily.   cetirizine 10 MG tablet Commonly known as:  ZYRTEC Take 10 mg by mouth daily.   GERITOL COMPLETE PO Take 1 tablet by mouth daily.   INVOKANA 300 MG Tabs tablet Generic drug:  canagliflozin Take 300 mg by mouth daily.   isosorbide mononitrate 30 MG 24 hr tablet Commonly known as:  IMDUR Take 0.5 tablets (15 mg total) by mouth daily.   JANUVIA 100 MG tablet Generic drug:  sitaGLIPtin Take 100 mg by mouth daily.   metFORMIN 1000 MG tablet Commonly known as:  GLUCOPHAGE Take 1,000 mg by mouth 2 (two) times daily.   Metoprolol Tartrate 37.5 MG Tabs TAKE 1 TABLET TWICE DAILY What changed:    how much to take  how to take  this  when to take this   nitroGLYCERIN 0.4 MG SL tablet Commonly known as:  NITROSTAT Place 1 tablet (0.4 mg total) under the tongue every 5 (five) minutes as needed for chest pain. MAX 3 doses   ranitidine 150 MG tablet Commonly known as:  ZANTAC Take 150 mg by mouth 2 (two) times daily.   Vitamin D3 50000 units Caps Take 50,000 Units by mouth once a week.      Outstanding Labs/Studies   N/A  Duration of Discharge Encounter   Greater than 30 minutes including physician time.  Signed, Abigail Butts NP 03/03/2017, 2:50 PM  [phone]

## 2017-03-03 NOTE — Care Management Obs Status (Signed)
[place] NOTIFICATION   Patient Details  Name: [name] [name] MRN: [id number] Date of Birth: [birth year]   Medicare Observation Status Notification Given:  Yes    Carles Collet, RN 03/03/2017, 2:25 PM

## 2017-03-03 NOTE — Progress Notes (Signed)
Progress Note  Patient Name: [name] [name] Date of Encounter: 03/03/2017  Primary Cardiologist: Dr. Martinique  Subjective   Patient reports she was in her usual state of health until yesterday morning. Noted L lower chest pain radiating to L shoulder which was consistent with previous MI symptoms. States L shoulder pain has since resolved but is still with persistent L lower rib/chest pain. Does report some increased life stressors over the past month. Reports cough x1 week with some sputum. Denies any changes in activity - no heavy lifting, no injury to L shoulder, no new activities.   Inpatient Medications    Scheduled Meds: . aspirin  324 mg Oral NOW   Or  . aspirin  300 mg Rectal NOW  . aspirin EC  81 mg Oral Daily  . heparin  5,000 Units Subcutaneous Q8H  . insulin aspart  0-15 Units Subcutaneous TID WC  . insulin aspart  0-5 Units Subcutaneous QHS   Continuous Infusions:  PRN Meds: acetaminophen, nitroGLYCERIN, ondansetron (ZOFRAN) IV, zolpidem   Vital Signs    Vitals:   03/02/17 1525 03/02/17 1534 03/02/17 2116 03/03/17 0419  BP:  121/68 114/68 (!) 141/78  Pulse:  81 88 96  Resp:  18  15  Temp:  97.8 F (36.6 C) 98.7 F (37.1 C) 98.9 F (37.2 C)  TempSrc:  Oral Oral Oral  SpO2:   92% 95%  Weight: 211 lb 14.4 oz (96.1 kg)   212 lb 4.8 oz (96.3 kg)  Height: 5\' 6"  (1.676 m)       Intake/Output Summary (Last 24 hours) at 03/03/2017 0943 Last data filed at 03/03/2017 0422 Gross per 24 hour  Intake 698 ml  Output 2000 ml  Net -1302 ml   Filed Weights   03/02/17 0738 03/02/17 1525 03/03/17 0419  Weight: 210 lb (95.3 kg) 211 lb 14.4 oz (96.1 kg) 212 lb 4.8 oz (96.3 kg)    Telemetry    NSR. PVCs - Personally Reviewed  ECG    EKG 03/03/17 - Rate 89, NSR, non-ischemic - Personally Reviewed  Physical Exam   GEN: Sitting on the side of the bed in NAD Neck: No JVD Cardiac: RRR, no murmurs, rubs, or gallops.  Respiratory: Clear to auscultation  bilaterally; no wheezes, rales, rhonchi GI: NABS, Soft, nontender, non-distended  MS: Trace b/l LE edema; No deformity. TTP L lateral lower ribs under breast. No TTP of L shoulder Neuro:  Nonfocal  Psych: Normal affect   Labs    Chemistry Recent Labs  Lab 03/02/17 0743 03/02/17 1333 03/03/17 0557  NA 142  --  137  K 4.9  --  3.7  CL 107  --  101  CO2 24  --  23  GLUCOSE 106*  --  173*  BUN 14  --  15  CREATININE 0.82 1.01* 0.99  CALCIUM 9.7  --  9.6  GFRNONAA >60 >60 >60  GFRAA >60 >60 >60  ANIONGAP 11  --  13     Hematology Recent Labs  Lab 03/02/17 0743 03/02/17 1333 03/03/17 0557  WBC 10.2 11.5* 8.8  RBC 5.35* 5.24* 5.33*  HGB 15.3* 15.1* 15.0  HCT 47.1* 45.9 46.6*  MCV 88.0 87.6 87.4  MCH 28.6 28.8 28.1  MCHC 32.5 32.9 32.2  RDW 16.4* 16.0* 16.0*  PLT 248 279 265    Cardiac Enzymes Recent Labs  Lab 03/02/17 0743 03/02/17 1159  TROPONINI <0.03 <0.03    Recent Labs  Lab 03/02/17 0755  TROPIPOC  0.00     BNP Recent Labs  Lab 03/02/17 0743  BNP 79.5     DDimer  Recent Labs  Lab 03/02/17 0743  DDIMER 0.34     Radiology    Dg Chest 2 View  Result Date: 03/02/2017 CLINICAL DATA:  Chest pain and left shoulder pain EXAM: CHEST  2 VIEW COMPARISON:  07/18/2015 FINDINGS: Mild-to-moderate enlargement of the cardiopericardial silhouette. Prior CABG. Mild upper zone pulmonary vascular prominence with questionable Kerley B-lines peripherally at the left lung base. No pleural effusion. No discrete airspace opacities. Calcifications projecting over the anterior spine on the lateral projection probably represent the patient's prior left renal calculi. IMPRESSION: 1. Mild to moderate enlargement of the cardiopericardial silhouette with pulmonary venous hypertension and subtle Kerley B-lines at the left lung base suspicious for early interstitial edema. No overt airspace edema. Electronically Signed   By: Van Clines M.D.   On: 03/02/2017 08:21     Cardiac Studies   Ordered for TTE today.  Patient Profile     56 y.o. female with PMH of NSTEMI 05/2015 (found to have 3V CAD on cath, s/p CABG 06/2015 with LIMA-LAD, SVG-OM, SVG-RCA), STEMI 09/2015 (found to have occlusion of SVG-OM and SVG-RCA, and dRCA occlusion managed with PCI/DES to dRCA), last seen in the office 09/2016 at which time Brilinta was discontinued; now presenting s/p colonoscopy 12/14 with L lateral lower chest pain and L shoulder pain consistent with previous MI symptoms.   Assessment & Plan    1. Chest pain - Troponin negative x2. EKG non-ischemic - Chest pain is reproducible, possibly 2/2 strained muscle from cough vs referred pain from retained intestinal air from colonoscopy - L shoulder pain resolved - Deferring invasive ischemic evaluation at this time  2. CAD: NSTEMI 05/2015 s/p CABG 06/2015 with LIMA-LAD, SVG-OM, SVG-RCA; STEMI 09/2015 (found to have occlusion of SVG-OM and SVG-RCA, and dRCA occlusion managed with PCI/DES to dRCA) - Continue ASA, atorvastatin, and nitroglycerin - Can likely resume imdur, metoprolol at discharge   3. Early interstitial edema on CXR - BNP 79.5 - O2 sat decreased to 84% on RA yesterday (patient reports sleeping at the time) - now stable at 95% on RA - S/p Lasix 40mg  IV 12/17 - net output negative 1.3L - Will follow-up TTE today - Can consider outpatient sleep study to assess for OSA  4. DM - Continue ISS and titrate as needed - Can resume home Invokana, Januvia, and metformin at discharge  For questions or updates, please contact [facility] Please consult [email or web address] for contact info under Cardiology/STEMI.      Signed, Abigail Butts, [name]  03/03/2017, 9:43 AM   [phone]

## 2017-03-03 NOTE — Progress Notes (Signed)
  Echocardiogram 2D Echocardiogram has been performed.  [name] [name] 03/03/2017, 12:47 PM

## 2017-03-19 DIAGNOSIS — I5033 Acute on chronic diastolic (congestive) heart failure: Secondary | ICD-10-CM | POA: Diagnosis not present

## 2017-03-19 DIAGNOSIS — M059 Rheumatoid arthritis with rheumatoid factor, unspecified: Secondary | ICD-10-CM | POA: Diagnosis not present

## 2017-03-19 DIAGNOSIS — E559 Vitamin D deficiency, unspecified: Secondary | ICD-10-CM | POA: Diagnosis not present

## 2017-03-19 DIAGNOSIS — N393 Stress incontinence (female) (male): Secondary | ICD-10-CM | POA: Diagnosis not present

## 2017-03-19 DIAGNOSIS — E1165 Type 2 diabetes mellitus with hyperglycemia: Secondary | ICD-10-CM | POA: Diagnosis not present

## 2017-03-19 DIAGNOSIS — I2102 ST elevation (STEMI) myocardial infarction involving left anterior descending coronary artery: Secondary | ICD-10-CM | POA: Diagnosis not present

## 2017-03-19 DIAGNOSIS — N760 Acute vaginitis: Secondary | ICD-10-CM | POA: Diagnosis not present

## 2017-03-27 ENCOUNTER — Ambulatory Visit: Payer: Medicare HMO | Admitting: Physical Therapy

## 2017-03-31 NOTE — Progress Notes (Signed)
Cardiology Office Note    Date:  04/02/2017   ID:  [name] [name], DOB 1960/09/06, MRN [id number]  PCP:  Leeroy Cha, MD  Cardiologist:  Dr. Martinique  Chief Complaint: follow up CAD  History of Present Illness:   [name] [name] is a 57 y.o. female with past medical history of CAD (s/p CABG on 06/19/2015 w/ LIMA-LAD, SVG-RCA, SVG-OM2), Lupus, Type 2 DM, HTN, and HLD who is seen for follow up CAD.   In April 2017 hospitalized with NSTEMI and cath with severe 3-vessel disease with 80% RCA stenosis, 85% Prox LAD stenosis, and 90% 1st Mrg with 85% 2nd Mrg. CABG was recommended and this was performed on 4/4 with the LIMA-LAD, SVG-RCA, and SVG-OM2. She was started on ASA and Plavix.  She presented 10/04/15 to [facility] with complained of chest discomfort for the past 3 weeks. Upon arrival to the ED, she was noted to have ST elevation in the inferior and lateral leads of her EKG. Emergently to cardiac catheterization revealed Patent LIMA to the LAD, occluded SVG to OM, occluded SVG to RCA.  Dis. RCA 99% stenosed and she underwent emergent PCI with Promus DES to distal RCA. Her troponin peaked at 14 and then began trending downward. Plavix changed to Cora. Pt had persistent ST elevation inferiorly and tachycardia. Increased BB.Echo with EF 55-60% inferior hypokinesis and relatively hyperdynamic anterior wall motion.  Zocor changed to lipitor.   She was seen in December with atypical lateral chest pain, shoulder pain- worse with deep breathing. Troponin levels were normal. She had colonoscopy with polypectomy several days prior. There was mild edema noted on CXR and she was given IV lasix x1. Echo showed EF 45-50% that on my review was not significantly changed from prior. She was observed overnight and DC the next day.   Presents today for follow up. She reports she had a fever and diffuse myalgias last night. No fever this am.  She reports she has been under a lot of stress related to her  living arrangement. She does have some chest pain but mostly on right side toward her shoulder and changes with movement.   Denies orthopnea, PND or syncope. She has a hard time eating healthy since she is living with her son and eats basically whatever he brings home.    Past Medical History:  Diagnosis Date  . Acute ST elevation myocardial infarction (STEMI) involving right coronary artery ([place]) 10/04/2015  . Anxiety   . Anxiety and depression   . CAD (coronary artery disease)    a. CABG 06/2015: LIMA-LAD, SVG-RCA, SVG-OM2 b. STEMI s/p PCI to distal RCA lesion with a small Promus Premier stent. ( patent LIMA to the LAD, occluded SVG to OM, occluded SVG to RCA.)  . CAD (coronary artery disease) of artery bypass graft; occluded SVG to RCA and occluded SVG to OM 10/08/2015  . Empty sella ([place])   . Hidradenitis axillaris   . Hyperlipemia   . Hypertension   . Kidney stones   . Lupus dx'd 2008   "they think I have this; have to get retested" (06/12/2015)  . Obesity (BMI 30.0-34.9)   . Ovarian cyst   . Pneumonia X 2  . PONV (postoperative nausea and vomiting)   . Rheumatoid aortitis   . Rheumatoid arthritis ([place])   . S/P angioplasty with stent; 10/04/15 to distal RCA lesion. with Promus premier. 10/08/2015  . Scoliosis   . Stroke [facility]) March 2014   left MCA branches; "they say  I have them all the time", denies residual on 06/12/2015  . Tobacco abuse   . Type II diabetes mellitus ([place])     Past Surgical History:  Procedure Laterality Date  . ABDOMINAL HYSTERECTOMY    . BREAST BIOPSY Left   . CARDIAC CATHETERIZATION N/A 06/13/2015   Procedure: Left Heart Cath and Coronary Angiography;  Surgeon: Lylla Eifler M Martinique, MD;  Location: [place] CV LAB;  Service: Cardiovascular;  Laterality: N/A;  . CARDIAC CATHETERIZATION N/A 10/04/2015   Procedure: Left Heart Cath and Coronary Angiography;  Surgeon: [name], MD;  Location: [place] CV LAB;  Service: Cardiovascular;  Laterality: N/A;    . CARDIAC CATHETERIZATION N/A 10/04/2015   Procedure: Coronary Stent Intervention;  Surgeon: [name], MD;  Location: [place] CV LAB;  Service: Cardiovascular;  Laterality: N/A;  . CORONARY ARTERY BYPASS GRAFT N/A 06/19/2015   Procedure: CORONARY ARTERY BYPASS GRAFTING (CABG) TIMES FOUR USING LEFT INTERNAL MAMMARY, RIGHT SAPHENOUS LEG VEIN AND CRYO SAPHENOUS VEIN;  Surgeon: [name], MD;  Location: [place];  Service: Open Heart Surgery;  Laterality: N/A;  . DILATION AND CURETTAGE OF UTERUS    . TEE WITHOUT CARDIOVERSION N/A 06/19/2015   Procedure: TRANSESOPHAGEAL ECHOCARDIOGRAM (TEE);  Surgeon: [name], MD;  Location: [place];  Service: Open Heart Surgery;  Laterality: N/A;  . TONSILLECTOMY  1960s  . TUBAL LIGATION      Current Medications: Allergies as of 04/02/2017   No Known Allergies     Medication List        Accurate as of 04/02/17  8:23 AM. Always use your most recent med list.          acetaminophen 325 MG tablet Commonly known as:  TYLENOL Take 2 tablets (650 mg total) by mouth every 4 (four) hours as needed for headache or mild pain.   aspirin 81 MG EC tablet Take 1 tablet (81 mg total) by mouth daily.   atorvastatin 80 MG tablet Commonly known as:  LIPITOR TAKE 1 TABLET BY MOUTH DAILY AT 6PM   BIOTIN PO Take 1 tablet by mouth daily.   cetirizine 10 MG tablet Commonly known as:  ZYRTEC Take 10 mg by mouth daily.   GERITOL COMPLETE PO Take 1 tablet by mouth daily.   INVOKANA 300 MG Tabs tablet Generic drug:  canagliflozin Take 300 mg by mouth daily.   isosorbide mononitrate 30 MG 24 hr tablet Commonly known as:  IMDUR Take 0.5 tablets (15 mg total) by mouth daily.   JANUVIA 100 MG tablet Generic drug:  sitaGLIPtin Take 100 mg by mouth daily.   metFORMIN 1000 MG tablet Commonly known as:  GLUCOPHAGE Take 1,000 mg by mouth 2 (two) times daily.   Metoprolol Tartrate 37.5 MG Tabs TAKE 1 TABLET TWICE DAILY   nitroGLYCERIN 0.4 MG  SL tablet Commonly known as:  NITROSTAT Place 1 tablet (0.4 mg total) under the tongue every 5 (five) minutes as needed for chest pain. MAX 3 doses   ranitidine 150 MG tablet Commonly known as:  ZANTAC Take 150 mg by mouth 2 (two) times daily.   Vitamin D 2000 units tablet Take 2,000 Units by mouth daily. Not taking yet       Allergies:   Patient has no known allergies.   Social History   Socioeconomic History  . Marital status: Widowed    Spouse name: None  . Number of children: 2  . Years of education: college  . Highest education  level: None  Social Needs  . Financial resource strain: None  . Food insecurity - worry: None  . Food insecurity - inability: None  . Transportation needs - medical: None  . Transportation needs - non-medical: None  Occupational History  . Occupation: Disability  Tobacco Use  . Smoking status: Former Smoker    Packs/day: 0.50    Years: 36.00    Pack years: 18.00    Types: Cigarettes    Last attempt to quit: 06/12/2014    Years since quitting: 2.8  . Smokeless tobacco: Never Used  Substance and Sexual Activity  . Alcohol use: No    Alcohol/week: 0.0 oz  . Drug use: No  . Sexual activity: No  Other Topics Concern  . None  Social History Narrative   Patient currently lives at home with her disabled husband who was shot in 2009 and he is total care. She lives with her son and daughter and there is an 8 that comes in. Patient currently is on disability   She occasionally drinks caffeine.      Family History:  The patient's family history includes Alzheimer's disease in her unknown relative; Breast cancer in her mother and unknown relative; Depression in her son; Diabetes in her mother and unknown relative; Heart failure in her mother; Stroke in her maternal aunt.   ROS:   Please see the history of present illness.    ROS All other systems reviewed and are negative.   PHYSICAL EXAM:   VS:  BP 140/90 (BP Location: Right Arm, Patient  Position: Sitting, Cuff Size: Large)   Pulse 72   Ht 5\' 6"  (1.676 m)   Wt 219 lb 12.8 oz (99.7 kg)   BMI 35.48 kg/m    GENERAL:  Well appearing BF HEENT:  PERRL, EOMI, sclera are clear. Oropharynx is clear. NECK:  No jugular venous distention, carotid upstroke brisk and symmetric, no bruits, no thyromegaly or adenopathy LUNGS:  Clear to auscultation bilaterally CHEST:  Unremarkable HEART:  RRR,  PMI not displaced or sustained,S1 and S2 within normal limits, no S3, no S4: no clicks, no rubs, no murmurs ABD:  Soft, nontender. BS +, no masses or bruits. No hepatomegaly, no splenomegaly EXT:  2 + pulses throughout, no edema, no cyanosis no clubbing SKIN:  Warm and dry.  No rashes NEURO:  Alert and oriented x 3. Cranial nerves II through XII intact. PSYCH:  Cognitively intact    Wt Readings from Last 3 Encounters:  04/02/17 219 lb 12.8 oz (99.7 kg)  03/03/17 212 lb 4.8 oz (96.3 kg)  09/19/16 220 lb (99.8 kg)      Studies/Labs Reviewed:   EKG:  EKG is not ordered today.    Recent Labs: 09/19/2016: ALT 31 03/02/2017: B Natriuretic Peptide 79.5 03/03/2017: BUN 15; Creatinine, Ser 0.99; Hemoglobin 15.0; Platelets 265; Potassium 3.7; Sodium 137   Lipid Panel    Component Value Date/Time   CHOL 157 09/19/2016 0900   TRIG 89 09/19/2016 0900   HDL 62 09/19/2016 0900   CHOLHDL 2.5 09/19/2016 0900   CHOLHDL 2.9 10/05/2015 0305   VLDL 28 10/05/2015 0305   LDLCALC 77 09/19/2016 0900    Additional studies/ records that were reviewed today include:   Labs from primary care: 10/22/15: cholesterol 151, triglycerides 180, LDL 67, HDL 48. A1c 7.6%. CMET and CBC normal.  Dated 11/25/16: cholesterol 183, triglycerides 117, HDL 62, LDL 98. A1c 7.1%.    Echocardiogram: 10/05/15 LV EF: 55% -   60%  -------------------------------------------------------------------  Indications:      CAD of native vessels  414.01.  ------------------------------------------------------------------- History:   Risk factors:  Hypertension. Diabetes mellitus. Dyslipidemia.  ------------------------------------------------------------------- Study Conclusions  - Left ventricle: The cavity size was normal. Wall thickness was   increased in a pattern of mild LVH. Systolic function was normal.   The estimated ejection fraction was in the range of 55% to 60%.   Inferior wall hypokinesis. Doppler parameters are consistent with   abnormal left ventricular relaxation (grade 1 diastolic   dysfunction). The E/e&' ratio is between 8-15, suggesting   indeterminate LV filling pressure. - Ventricular septum: Septal motion showed abnormal function and   dyssynergy. - Aortic valve: Trileaflet. Sclerosis without stenosis. There was   no regurgitation. - Mitral valve: Mildly thickened leaflets . There was trivial   regurgitation. - Right ventricle: The cavity size was normal. Systolic function is   low normal. - Right atrium: The atrium was normal in size. - Tricuspid valve: There was trivial regurgitation. - Pulmonary arteries: PA peak pressure: 24 mm Hg (S). - Inferior vena cava: The vessel was normal in size. The   respirophasic diameter changes were in the normal range (>= 50%),   consistent with normal central venous pressure.  Impressions:  - Compared to a recent echo in 06/2015, the LVEF is stable at   55-60%, however, there is inferior hypokinesis and relatively   hyperdynamic anterior wall motion.  Cardiac Catheterization: 10/04/15    Prox LAD lesion, 85% stenosed. Patent LIMA to LAD.  Severe native 3 vessel CAD.  Lat 1st Mrg-2 lesion, 100% stenosed. Occluded SVG to OM.  Ost RCA to Mid RCA lesion, 70% stenosed. Ocluded SVG to RCA,  Dist RCA lesion, 99% stenosed. Post intervention with a 2.25 x 12 Promus Premier, there is a 0% residual stenosis.  Normal LVEDP.   Switch to Brilinta from  Plavix.  She needs aggressive secondary prevention.  Patient with residual ST elevation in the inferior leads but her chest pain has improved significantly.  Proximal RCA disease did not seem critical, compared to distal lesion.    Echo 03/03/18: Study Conclusions  - Left ventricle: The cavity size was normal. Wall thickness was   increased in a pattern of mild LVH. Systolic function was mildly   reduced. The estimated ejection fraction was in the range of 45%   to 50%. Severe inferior hypokinesis to akinesis. Doppler   parameters are consistent with abnormal left ventricular   relaxation (grade 1 diastolic dysfunction). The E/e&' ratio is   between 8-15, suggesting indeterminate LV filling pressure. - Aortic valve: Trileaflet. Sclerosis without stenosis. There was   no regurgitation. - Mitral valve: Mildly thickened leaflets . There was trivial   regurgitation. - Left atrium: The atrium was normal in size. - Inferior vena cava: The vessel was normal in size. The   respirophasic diameter changes were in the normal range (>= 50%),   consistent with normal central venous pressure.  Impressions:  - Compared to a prior study in 09/2015, the LVEF is lower at 45-50%   - global hypokinesis with inferior wall hypokinesis is noted.   ASSESSMENT & PLAN:    1. CAD s/p CABG 06/2015 with early graft failure.  - STEMI 10/04/15 with early loss of SVG to OM & SVG to RCA  s/p PCI to distal RCA lesion with a  Promus Premier stent. Patent LIMA to LAD.   -  She has only atypical chest pain currently that does not sound anginal.  -  Continue ASA,  statin, metoprolol and imdur.   2. HLD - Continue statin - noted her last LDL in September had increased from 67/77 up to 98. Attribute this mostly to poor diet. Reinforced need for healthy food choices.   3. HTN - BP stable  - Will continue BB and imdur.   4. DM - Managed by PCP on good regimen.   Medication Adjustments/Labs and Tests  Ordered: Current medicines are reviewed at length with the patient today.  Concerns regarding medicines are outlined above.  Medication changes, Labs and Tests ordered today are listed in the Patient Instructions below. Patient Instructions  Continue your current therapy  Work on eating a healthy diet  I will see you in 6 months      Signed, Keiton Cosma Martinique, MD  04/02/2017 8:22 AM    [facility] [place], [place], [place]  [address] Phone: [phone]; Fax: [phone]

## 2017-04-02 ENCOUNTER — Encounter: Payer: Self-pay | Admitting: Cardiology

## 2017-04-02 ENCOUNTER — Ambulatory Visit (INDEPENDENT_AMBULATORY_CARE_PROVIDER_SITE_OTHER): Payer: Medicare HMO | Admitting: Cardiology

## 2017-04-02 VITALS — BP 140/90 | HR 72 | Ht 66.0 in | Wt 219.8 lb

## 2017-04-02 DIAGNOSIS — Z951 Presence of aortocoronary bypass graft: Secondary | ICD-10-CM

## 2017-04-02 DIAGNOSIS — E119 Type 2 diabetes mellitus without complications: Secondary | ICD-10-CM

## 2017-04-02 DIAGNOSIS — I2510 Atherosclerotic heart disease of native coronary artery without angina pectoris: Secondary | ICD-10-CM

## 2017-04-02 DIAGNOSIS — I10 Essential (primary) hypertension: Secondary | ICD-10-CM | POA: Diagnosis not present

## 2017-04-02 DIAGNOSIS — E785 Hyperlipidemia, unspecified: Secondary | ICD-10-CM | POA: Diagnosis not present

## 2017-04-02 NOTE — Patient Instructions (Signed)
Continue your current therapy  Work on eating a healthy diet  I will see you in 6 months

## 2017-04-06 IMAGING — DX DG CHEST 2V
2 series · 2 of 2 positions shown · non-contrast
Comparison: 04/11/2012

CLINICAL DATA: Sharp mid chest pain radiating into the right arm

EXAM:
CHEST  2 VIEW

[w chest pa]
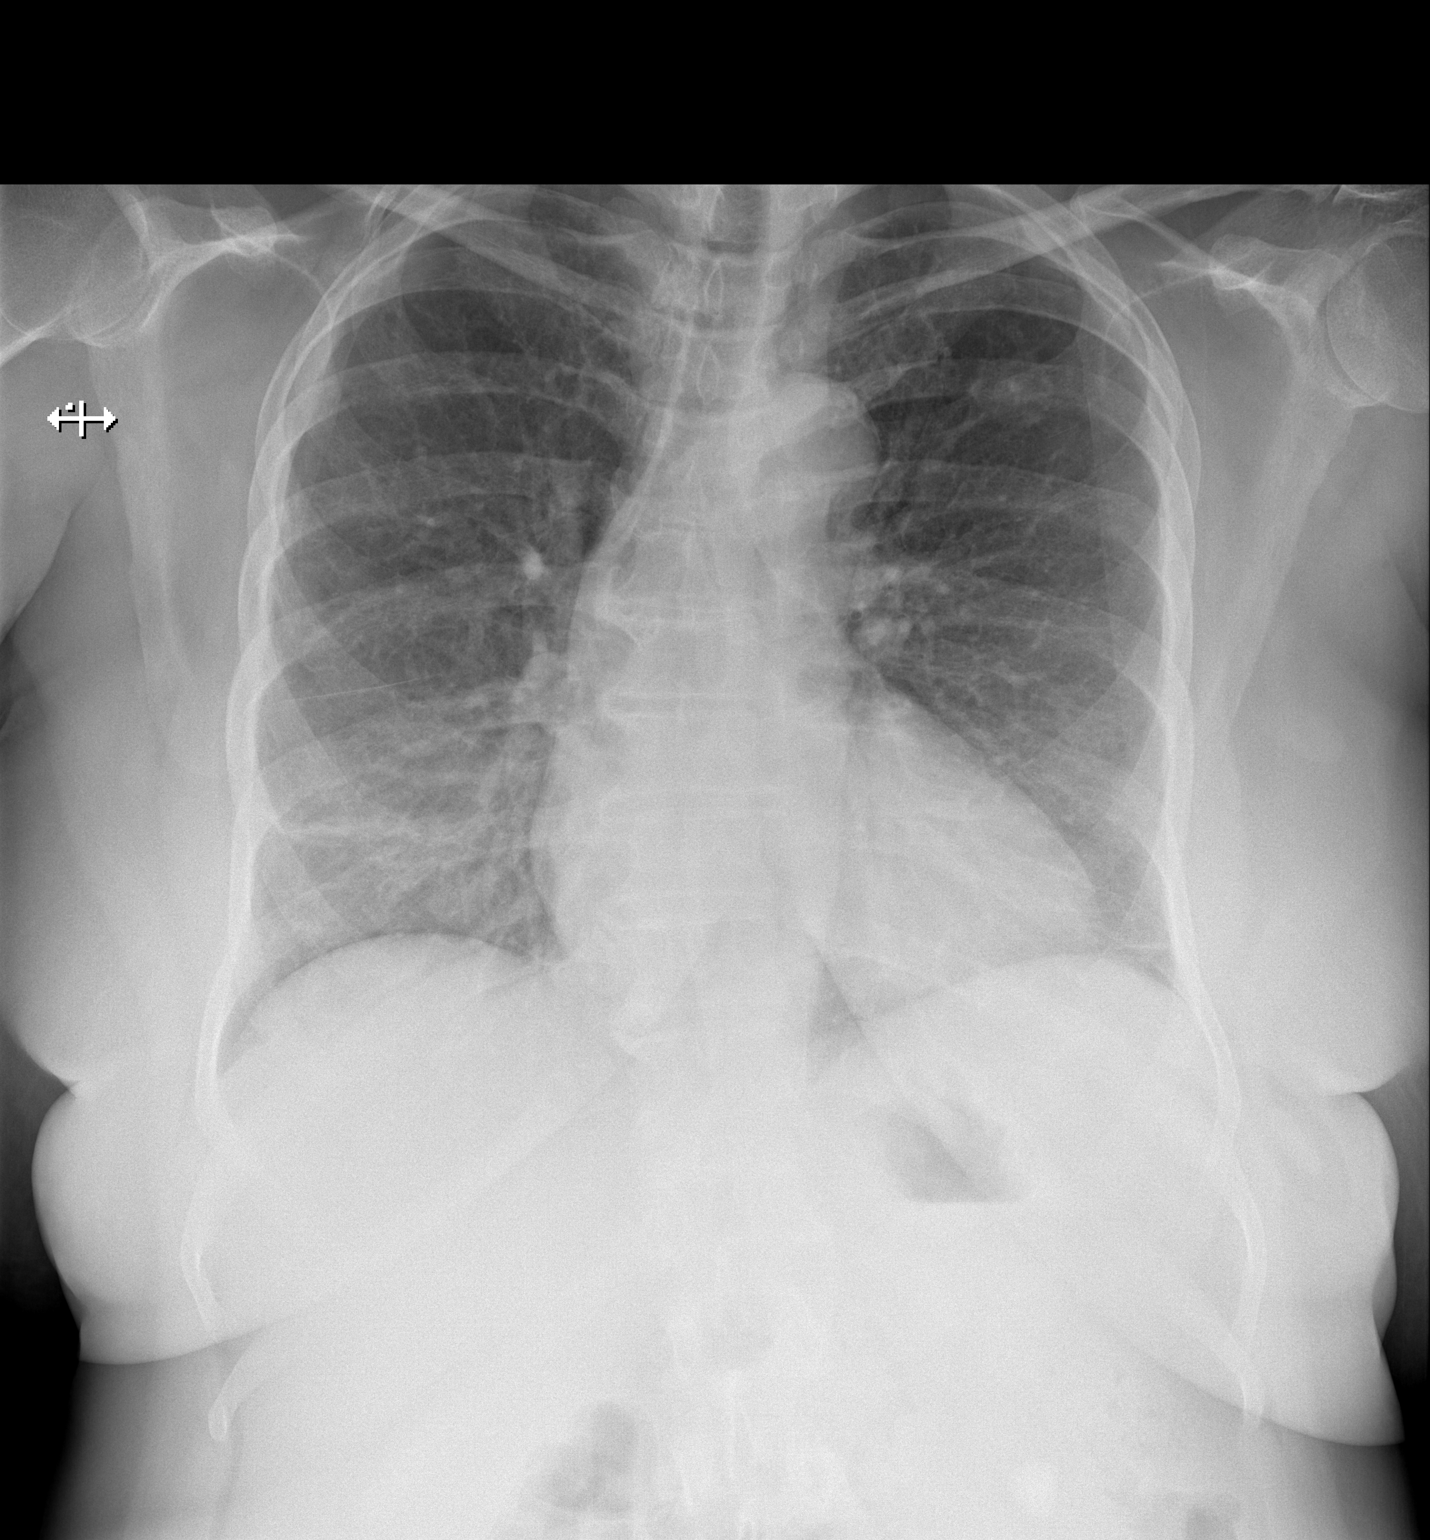

[w chest lat]
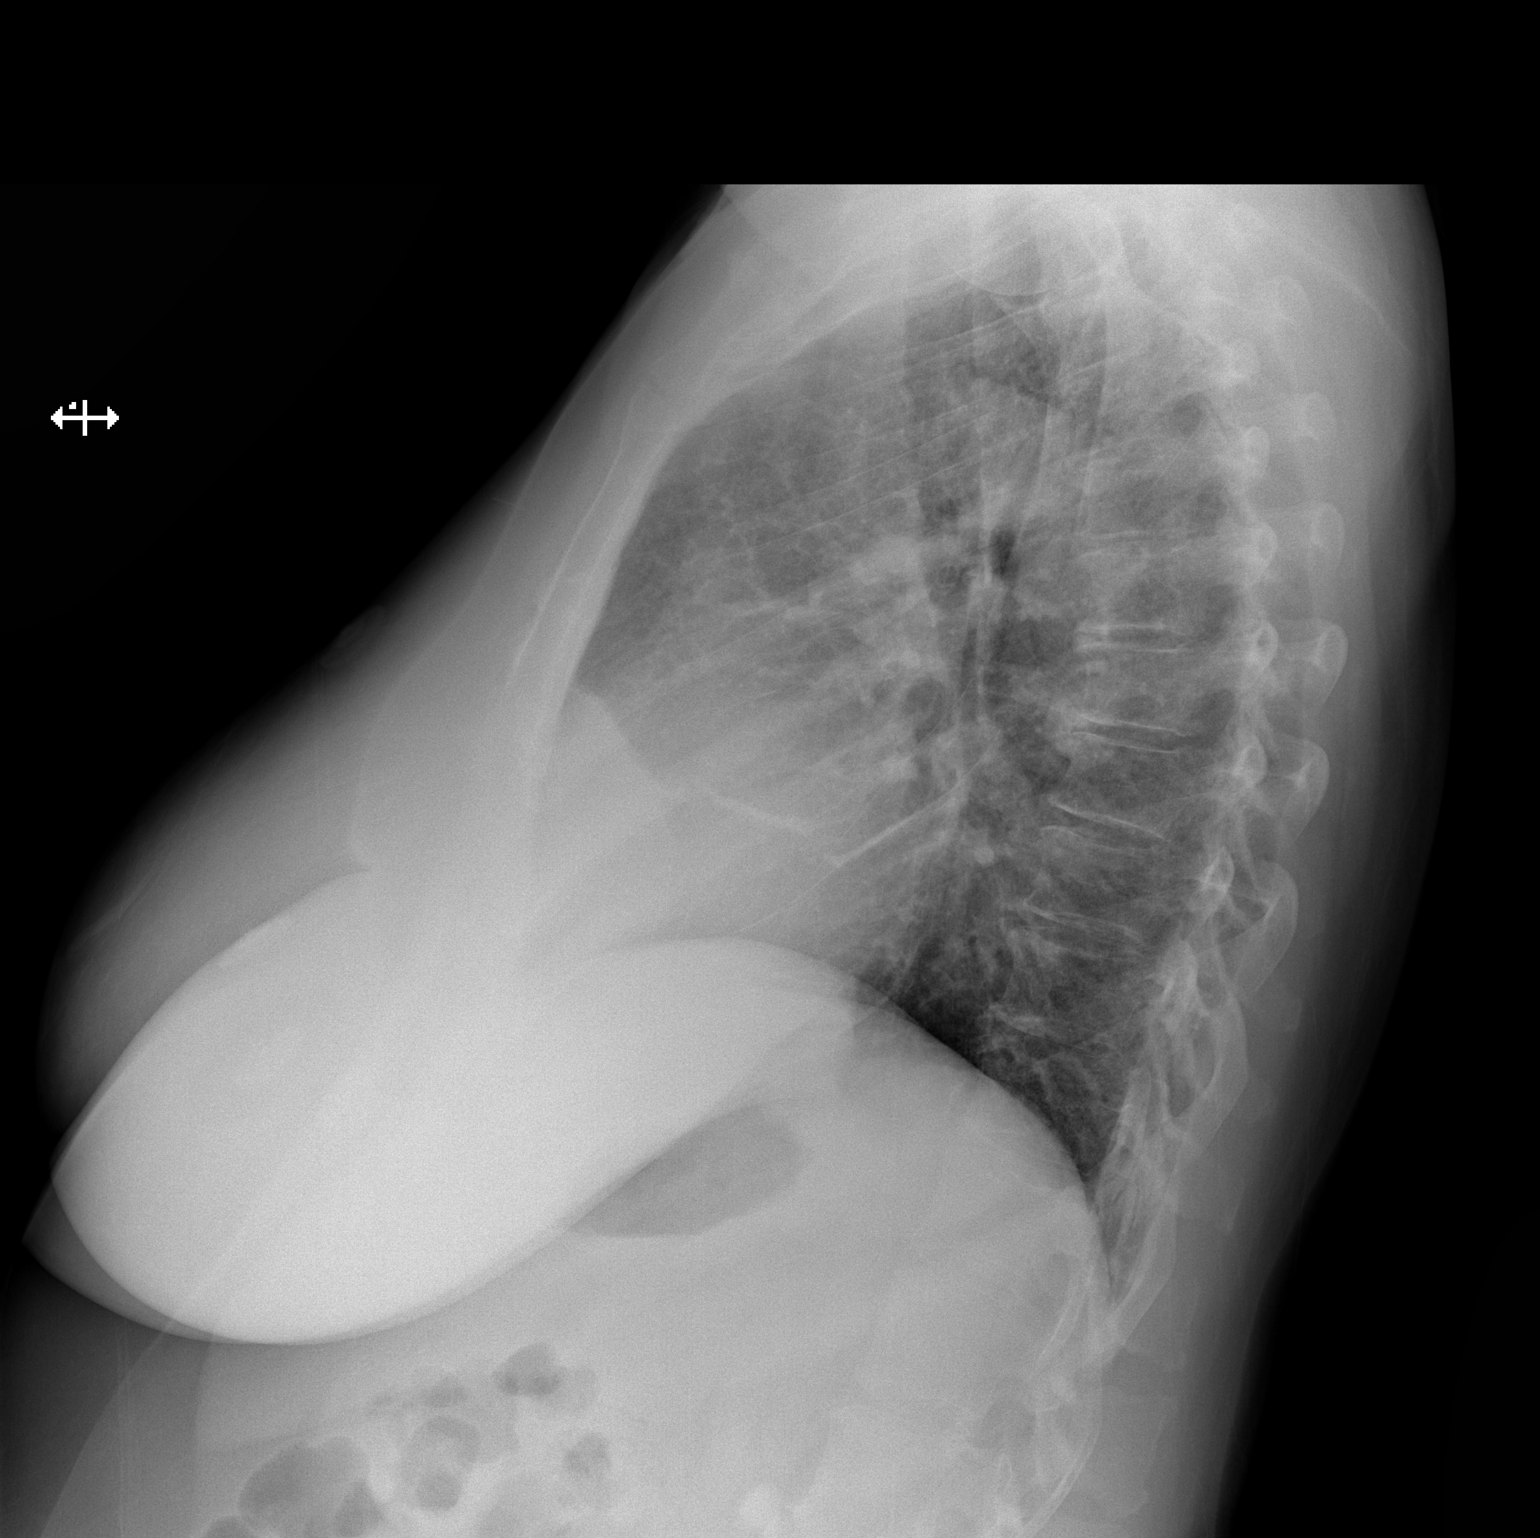

[2 of 2 positions shown; findings below may reference images not displayed]

FINDINGS: There is mild right middle lobe atelectasis. There is no focal
parenchymal opacity. There is no pleural effusion or pneumothorax.
The heart and mediastinal contours are unremarkable.

The osseous structures are unremarkable.
IMPRESSION: No active cardiopulmonary disease.

## 2017-04-06 IMAGING — DX DG CERVICAL SPINE WITH FLEX & EXTEND
8 series · 8 of 8 positions shown · non-contrast
Comparison: None.

CLINICAL DATA: Neck pain and bilateral hand numbness and tingling
for 2 weeks with no known injury

EXAM:
CERVICAL SPINE COMPLETE WITH FLEXION AND EXTENSION VIEWS

[w cervical spine lat]
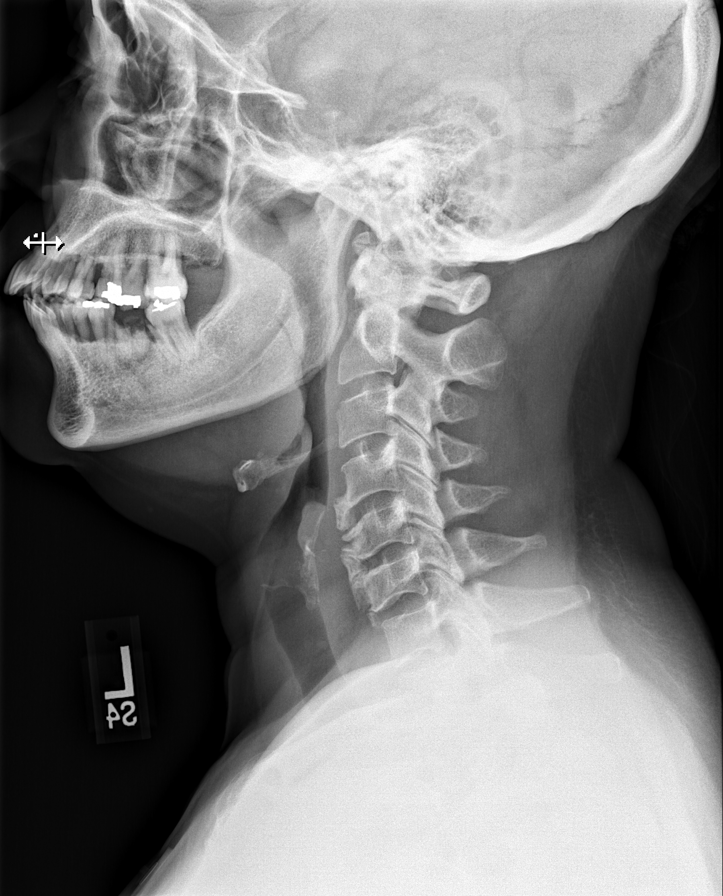

[w cervical spine ap_obl (1 of 2)]
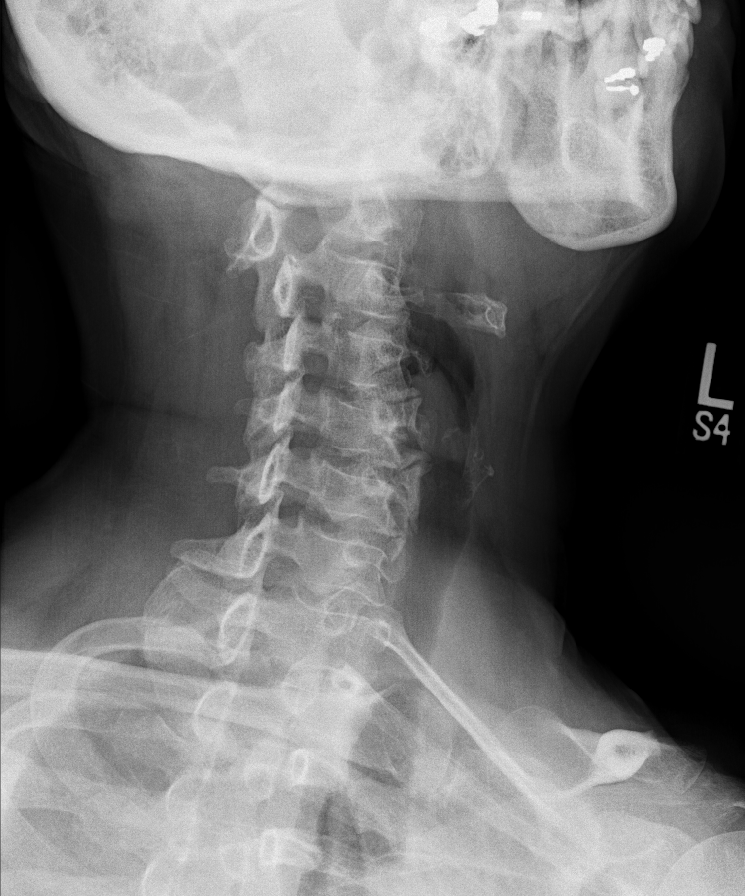

[w cervical spine ap_obl (2 of 2)]
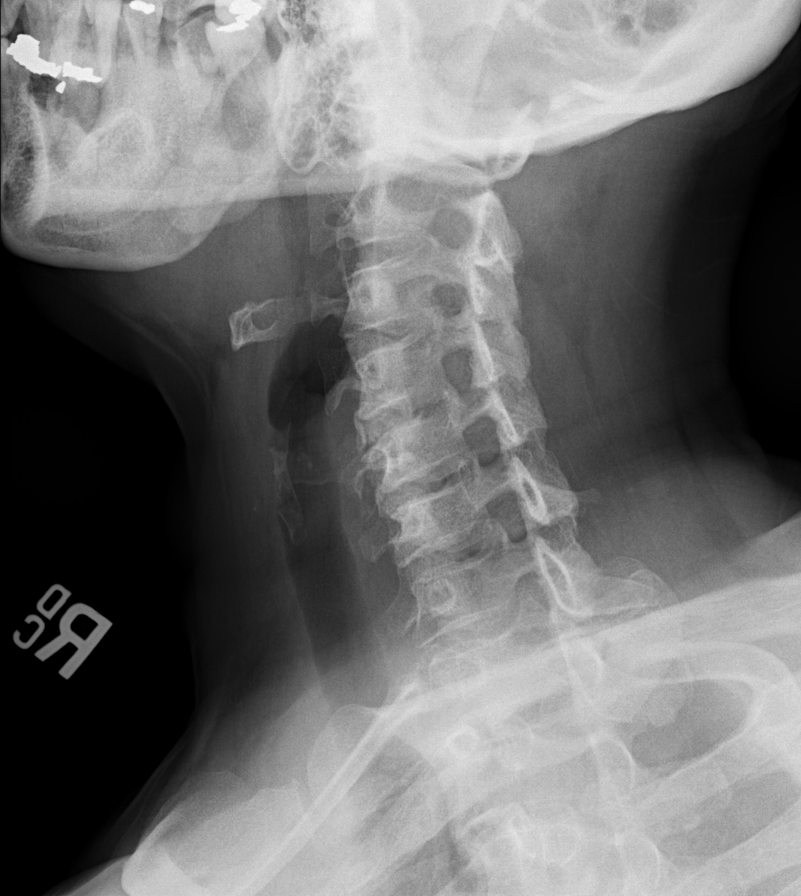

[w cervical spine ap]
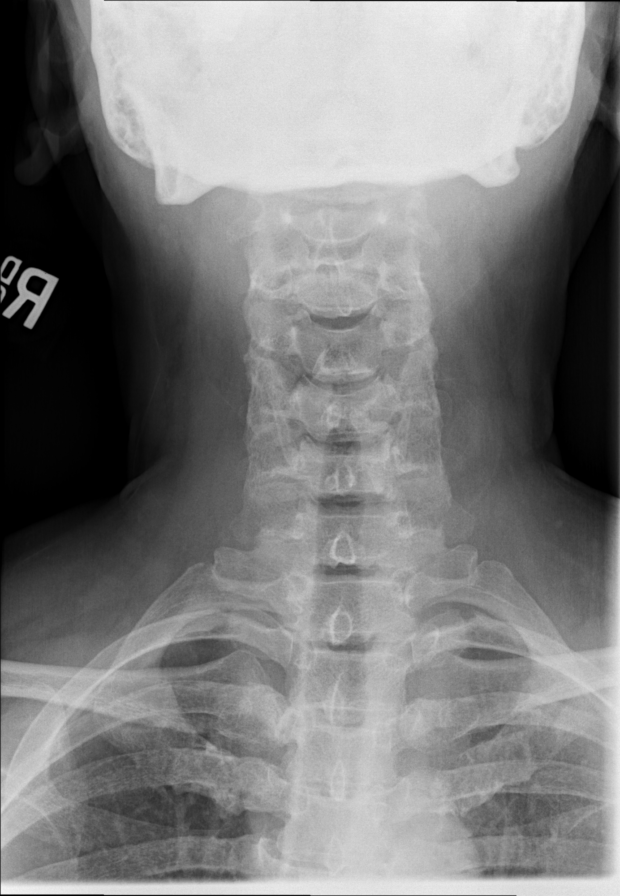

[w cervical spine odontoid]
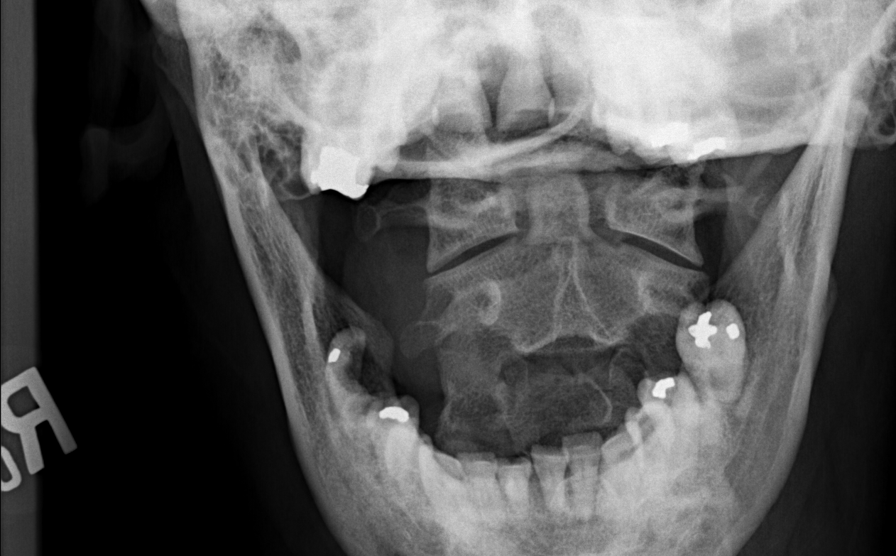

[w cervical swimmers]
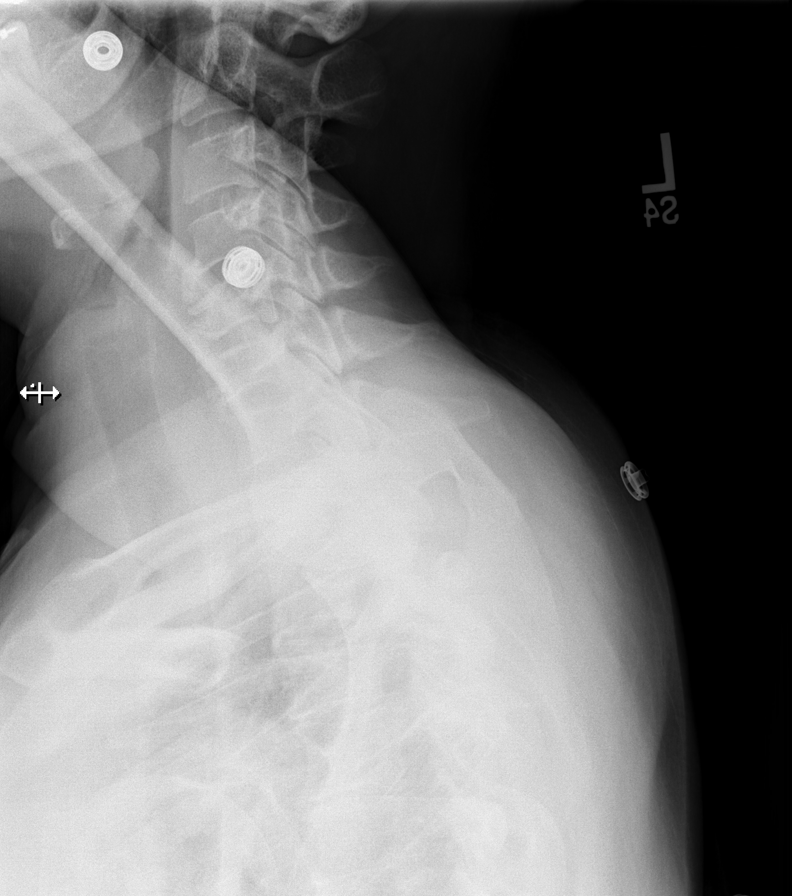

[w cervical spine flexion]
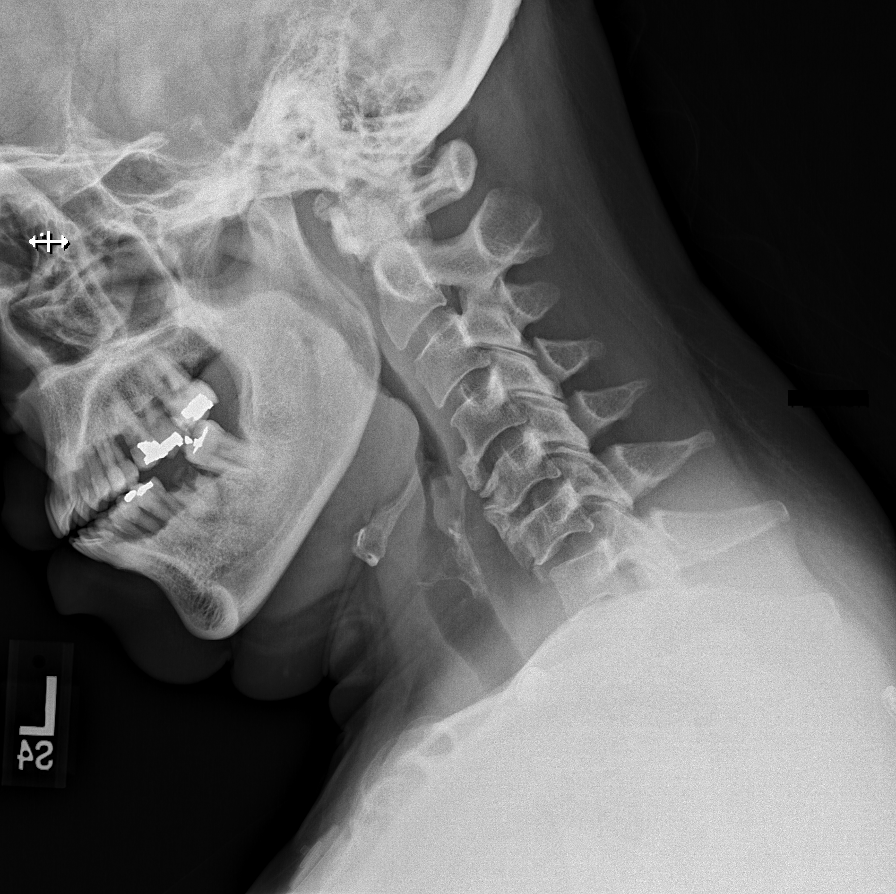

[w cervical spine extension]
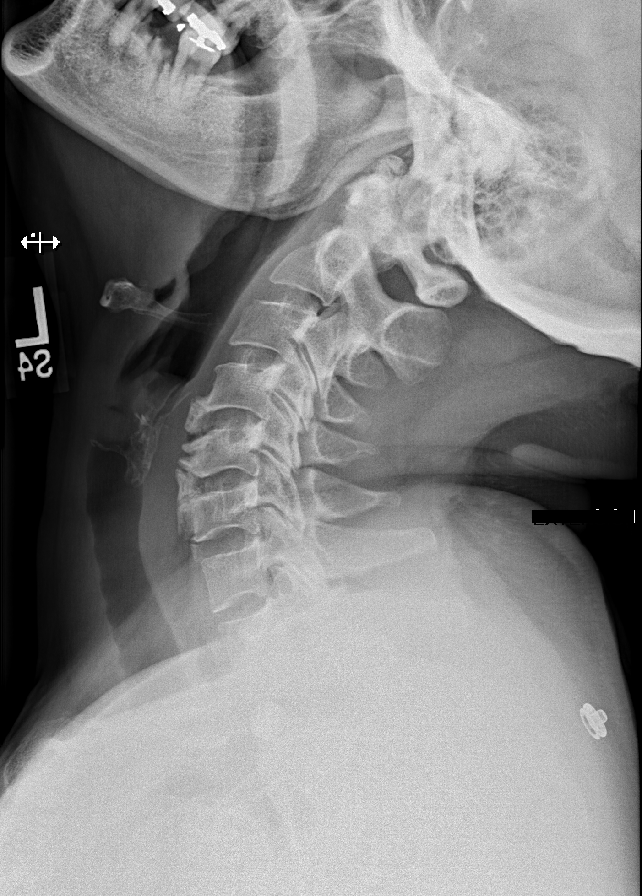

[8 of 8 positions shown; findings below may reference images not displayed]

FINDINGS: Normal alignment with no prevertebral soft tissue swelling. No
fracture. There is degenerative change at the atlantodental
articulation. Moderate C4-5, C5-6, and C6-7 degenerative disc
disease with mild degenerative disc disease at C7-T1. Prominent
anterior osteophytes are noted at C4-5, C5-6, and C6-7. There is
mild C5-6 and C6-7 facet arthropathy. There is no significant
foraminal narrowing.
IMPRESSION: Degenerative changes with no acute findings

## 2017-04-20 ENCOUNTER — Ambulatory Visit: Payer: Medicare HMO | Admitting: Physical Therapy

## 2017-05-15 ENCOUNTER — Other Ambulatory Visit: Payer: Self-pay | Admitting: Cardiology

## 2017-05-19 ENCOUNTER — Telehealth: Payer: Self-pay

## 2017-05-19 MED ORDER — METOPROLOL TARTRATE 25 MG PO TABS: ORAL_TABLET | ORAL | 6 refills | Status: DC

## 2017-05-19 NOTE — Telephone Encounter (Signed)
Spoke to patient received a letter from your pharmacy stating metoprolol 37.5 mg on back order.Advised metoprolol 25 mg take 1&1/2 tablets twice a day sent to your pharmacy.

## 2017-05-28 ENCOUNTER — Other Ambulatory Visit: Payer: Self-pay | Admitting: Cardiology

## 2017-06-02 DIAGNOSIS — I2510 Atherosclerotic heart disease of native coronary artery without angina pectoris: Secondary | ICD-10-CM | POA: Diagnosis not present

## 2017-06-02 DIAGNOSIS — I10 Essential (primary) hypertension: Secondary | ICD-10-CM | POA: Diagnosis not present

## 2017-06-02 DIAGNOSIS — E1121 Type 2 diabetes mellitus with diabetic nephropathy: Secondary | ICD-10-CM | POA: Diagnosis not present

## 2017-06-02 DIAGNOSIS — H66003 Acute suppurative otitis media without spontaneous rupture of ear drum, bilateral: Secondary | ICD-10-CM | POA: Diagnosis not present

## 2017-06-03 ENCOUNTER — Telehealth: Payer: Self-pay

## 2017-06-03 NOTE — Telephone Encounter (Signed)
Received a call from patient.She was calling to ask Dr.Jordan if ok to receive a Hepatitis B vaccine.Dr.Jordan advised ok.

## 2017-08-04 DIAGNOSIS — R102 Pelvic and perineal pain: Secondary | ICD-10-CM | POA: Diagnosis not present

## 2017-08-04 DIAGNOSIS — R1032 Left lower quadrant pain: Secondary | ICD-10-CM | POA: Diagnosis not present

## 2017-08-19 DIAGNOSIS — R0683 Snoring: Secondary | ICD-10-CM | POA: Diagnosis not present

## 2017-08-19 DIAGNOSIS — G4719 Other hypersomnia: Secondary | ICD-10-CM | POA: Diagnosis not present

## 2017-08-19 DIAGNOSIS — E785 Hyperlipidemia, unspecified: Secondary | ICD-10-CM | POA: Diagnosis not present

## 2017-08-19 DIAGNOSIS — G478 Other sleep disorders: Secondary | ICD-10-CM | POA: Diagnosis not present

## 2017-08-19 DIAGNOSIS — E1121 Type 2 diabetes mellitus with diabetic nephropathy: Secondary | ICD-10-CM | POA: Diagnosis not present

## 2017-08-19 DIAGNOSIS — I2510 Atherosclerotic heart disease of native coronary artery without angina pectoris: Secondary | ICD-10-CM | POA: Diagnosis not present

## 2017-08-19 DIAGNOSIS — G4700 Insomnia, unspecified: Secondary | ICD-10-CM | POA: Diagnosis not present

## 2017-08-19 DIAGNOSIS — K219 Gastro-esophageal reflux disease without esophagitis: Secondary | ICD-10-CM | POA: Diagnosis not present

## 2017-08-19 DIAGNOSIS — I10 Essential (primary) hypertension: Secondary | ICD-10-CM | POA: Diagnosis not present

## 2017-08-24 DIAGNOSIS — E1165 Type 2 diabetes mellitus with hyperglycemia: Secondary | ICD-10-CM | POA: Diagnosis not present

## 2017-08-24 DIAGNOSIS — E785 Hyperlipidemia, unspecified: Secondary | ICD-10-CM | POA: Diagnosis not present

## 2017-08-24 DIAGNOSIS — I2510 Atherosclerotic heart disease of native coronary artery without angina pectoris: Secondary | ICD-10-CM | POA: Diagnosis not present

## 2017-08-24 DIAGNOSIS — I10 Essential (primary) hypertension: Secondary | ICD-10-CM | POA: Diagnosis not present

## 2017-08-24 DIAGNOSIS — Z7984 Long term (current) use of oral hypoglycemic drugs: Secondary | ICD-10-CM | POA: Diagnosis not present

## 2017-08-24 DIAGNOSIS — M059 Rheumatoid arthritis with rheumatoid factor, unspecified: Secondary | ICD-10-CM | POA: Diagnosis not present

## 2017-08-24 DIAGNOSIS — I5033 Acute on chronic diastolic (congestive) heart failure: Secondary | ICD-10-CM | POA: Diagnosis not present

## 2017-08-24 DIAGNOSIS — I2102 ST elevation (STEMI) myocardial infarction involving left anterior descending coronary artery: Secondary | ICD-10-CM | POA: Diagnosis not present

## 2017-09-15 ENCOUNTER — Other Ambulatory Visit: Payer: Self-pay

## 2017-09-15 MED ORDER — METOPROLOL TARTRATE 25 MG PO TABS: ORAL_TABLET | ORAL | 1 refills | Status: DC

## 2017-09-30 DIAGNOSIS — G4733 Obstructive sleep apnea (adult) (pediatric): Secondary | ICD-10-CM | POA: Diagnosis not present

## 2017-10-01 DIAGNOSIS — G4733 Obstructive sleep apnea (adult) (pediatric): Secondary | ICD-10-CM | POA: Diagnosis not present

## 2017-10-02 ENCOUNTER — Encounter: Payer: Self-pay | Admitting: Cardiology

## 2017-10-02 DIAGNOSIS — E1121 Type 2 diabetes mellitus with diabetic nephropathy: Secondary | ICD-10-CM | POA: Diagnosis not present

## 2017-10-02 DIAGNOSIS — N3000 Acute cystitis without hematuria: Secondary | ICD-10-CM | POA: Diagnosis not present

## 2017-10-05 DIAGNOSIS — Z8673 Personal history of transient ischemic attack (TIA), and cerebral infarction without residual deficits: Secondary | ICD-10-CM | POA: Diagnosis not present

## 2017-10-05 DIAGNOSIS — M79674 Pain in right toe(s): Secondary | ICD-10-CM | POA: Diagnosis not present

## 2017-10-05 DIAGNOSIS — Z5181 Encounter for therapeutic drug level monitoring: Secondary | ICD-10-CM | POA: Diagnosis not present

## 2017-10-05 DIAGNOSIS — N390 Urinary tract infection, site not specified: Secondary | ICD-10-CM | POA: Diagnosis not present

## 2017-10-05 DIAGNOSIS — I2510 Atherosclerotic heart disease of native coronary artery without angina pectoris: Secondary | ICD-10-CM | POA: Diagnosis not present

## 2017-10-05 DIAGNOSIS — E1165 Type 2 diabetes mellitus with hyperglycemia: Secondary | ICD-10-CM | POA: Diagnosis not present

## 2017-10-14 ENCOUNTER — Other Ambulatory Visit: Payer: Self-pay | Admitting: Internal Medicine

## 2017-10-14 DIAGNOSIS — Z1231 Encounter for screening mammogram for malignant neoplasm of breast: Secondary | ICD-10-CM

## 2017-10-16 ENCOUNTER — Ambulatory Visit
Admission: RE | Admit: 2017-10-16 | Discharge: 2017-10-16 | Disposition: A | Discharge status: Home / Self Care | Payer: Medicare HMO | Source: Ambulatory Visit | Attending: Internal Medicine | Admitting: Internal Medicine

## 2017-10-16 DIAGNOSIS — Z1231 Encounter for screening mammogram for malignant neoplasm of breast: Secondary | ICD-10-CM | POA: Diagnosis not present

## 2017-10-23 ENCOUNTER — Encounter: Payer: Self-pay | Admitting: Cardiology

## 2017-10-23 ENCOUNTER — Telehealth: Payer: Self-pay | Admitting: Cardiology

## 2017-10-23 NOTE — Telephone Encounter (Signed)
Received records from [place] on 10/23/17, Appt 11/12/17 @ 10:20AM. [place]

## 2017-11-05 DIAGNOSIS — I10 Essential (primary) hypertension: Secondary | ICD-10-CM | POA: Diagnosis not present

## 2017-11-05 DIAGNOSIS — M059 Rheumatoid arthritis with rheumatoid factor, unspecified: Secondary | ICD-10-CM | POA: Diagnosis not present

## 2017-11-05 DIAGNOSIS — I2102 ST elevation (STEMI) myocardial infarction involving left anterior descending coronary artery: Secondary | ICD-10-CM | POA: Diagnosis not present

## 2017-11-05 DIAGNOSIS — E785 Hyperlipidemia, unspecified: Secondary | ICD-10-CM | POA: Diagnosis not present

## 2017-11-05 DIAGNOSIS — Z7984 Long term (current) use of oral hypoglycemic drugs: Secondary | ICD-10-CM | POA: Diagnosis not present

## 2017-11-05 DIAGNOSIS — I5033 Acute on chronic diastolic (congestive) heart failure: Secondary | ICD-10-CM | POA: Diagnosis not present

## 2017-11-05 DIAGNOSIS — I2510 Atherosclerotic heart disease of native coronary artery without angina pectoris: Secondary | ICD-10-CM | POA: Diagnosis not present

## 2017-11-05 DIAGNOSIS — E1165 Type 2 diabetes mellitus with hyperglycemia: Secondary | ICD-10-CM | POA: Diagnosis not present

## 2017-11-06 NOTE — Progress Notes (Signed)
Cardiology Office Note    Date:  11/12/2017   ID:  [name] [name], DOB 05-27-1960, MRN [id number]  PCP:  Leeroy Cha, MD  Cardiologist:  Dr. Martinique  Chief Complaint: follow up CAD  History of Present Illness:   [name] [name] is a 57 y.o. female with past medical history of CAD (s/p CABG on 06/19/2015 w/ LIMA-LAD, SVG-RCA, SVG-OM2), Lupus, Type 2 DM, HTN, and HLD who is seen for follow up CAD.   In April 2017 hospitalized with NSTEMI and cath with severe 3-vessel disease with 80% RCA stenosis, 85% Prox LAD stenosis, and 90% 1st Mrg with 85% 2nd Mrg. CABG was recommended and this was performed on 4/4 with the LIMA-LAD, SVG-RCA, and SVG-OM2. She was started on ASA and Plavix.  She presented 10/04/15 to [facility] with complained of chest discomfort for the past 3 weeks. Upon arrival to the ED, she was noted to have ST elevation in the inferior and lateral leads of her EKG. Emergently to cardiac catheterization revealed Patent LIMA to the LAD, occluded SVG to OM, occluded SVG to RCA.  Distal RCA 99% stenosed and she underwent emergent PCI with Promus DES to distal RCA. Her troponin peaked at 14. Plavix changed to [place]. Pt had persistent ST elevation inferiorly and tachycardia. Increased BB.Echo with EF 55-60% inferior hypokinesis and relatively hyperdynamic anterior wall motion.  Zocor changed to lipitor.   She was seen in December with atypical lateral chest pain, shoulder pain- worse with deep breathing. Troponin levels were normal. She had colonoscopy with polypectomy several days prior. There was mild edema noted on CXR and she was given IV lasix x1. Echo showed EF 45-50% that on my review was not significantly changed from prior. She was observed overnight and DC the next day.   On follow up today she is doing well. Denies any chest pain or SOB. Not exercising regularly. Now on Victoza for DM. Last A1c 7.5%. Still has a difficult time with dietary choices which she attributes to her  current living situation. She is going to see Rheumatology in Dec. Is going to get fitted for a dental appliance for sleep apnea but needs to see her dentist first.    Past Medical History:  Diagnosis Date  . Acute ST elevation myocardial infarction (STEMI) involving right coronary artery ([place]) 10/04/2015  . Anxiety   . Anxiety and depression   . CAD (coronary artery disease)    a. CABG 06/2015: LIMA-LAD, SVG-RCA, SVG-OM2 b. STEMI s/p PCI to distal RCA lesion with a small Promus Premier stent. ( patent LIMA to the LAD, occluded SVG to OM, occluded SVG to RCA.)  . CAD (coronary artery disease) of artery bypass graft; occluded SVG to RCA and occluded SVG to OM 10/08/2015  . Empty sella ([place])   . Hidradenitis axillaris   . Hyperlipemia   . Hypertension   . Kidney stones   . Lupus ([place]) dx'd 2008   "they think I have this; have to get retested" (06/12/2015)  . Obesity (BMI 30.0-34.9)   . Ovarian cyst   . Pneumonia X 2  . PONV (postoperative nausea and vomiting)   . Rheumatoid aortitis   . Rheumatoid arthritis ([place])   . S/P angioplasty with stent; 10/04/15 to distal RCA lesion. with Promus premier. 10/08/2015  . Scoliosis   . Stroke [facility]) March 2014   left MCA branches; "they say I have them all the time", denies residual on 06/12/2015  . Tobacco abuse   . Type  II diabetes mellitus ([place])     Past Surgical History:  Procedure Laterality Date  . ABDOMINAL HYSTERECTOMY    . BREAST BIOPSY Left   . CARDIAC CATHETERIZATION N/A 06/13/2015   Procedure: Left Heart Cath and Coronary Angiography;  Surgeon: Bellina Tokarczyk M [place], MD;  Location: [place] [facility];  Service: Cardiovascular;  Laterality: N/A;  . CARDIAC CATHETERIZATION N/A 10/04/2015   Procedure: Left Heart Cath and Coronary Angiography;  Surgeon: [name], MD;  Location: [place] [facility];  Service: Cardiovascular;  Laterality: N/A;  . CARDIAC CATHETERIZATION N/A 10/04/2015   Procedure: Coronary Stent Intervention;  Surgeon:  [name], MD;  Location: [place] [facility];  Service: Cardiovascular;  Laterality: N/A;  . CORONARY ARTERY BYPASS GRAFT N/A 06/19/2015   Procedure: CORONARY ARTERY BYPASS GRAFTING (CABG) TIMES FOUR USING LEFT INTERNAL MAMMARY, RIGHT SAPHENOUS LEG VEIN AND CRYO SAPHENOUS VEIN;  Surgeon: [name], MD;  Location: [place];  Service: Open Heart Surgery;  Laterality: N/A;  . DILATION AND CURETTAGE OF UTERUS    . TEE WITHOUT CARDIOVERSION N/A 06/19/2015   Procedure: TRANSESOPHAGEAL ECHOCARDIOGRAM (TEE);  Surgeon: [name], MD;  Location: [place];  Service: Open Heart Surgery;  Laterality: N/A;  . TONSILLECTOMY  1960s  . TUBAL LIGATION      Current Medications: Allergies as of 11/12/2017   No Known Allergies     Medication List        Accurate as of 11/12/17 10:30 AM. Always use your most recent med list.          acetaminophen 325 MG tablet Commonly known as:  TYLENOL Take 2 tablets (650 mg total) by mouth every 4 (four) hours as needed for headache or mild pain.   aspirin 81 MG EC tablet Take 1 tablet (81 mg total) by mouth daily.   atorvastatin 80 MG tablet Commonly known as:  LIPITOR TAKE 1 TABLET EVERY DAY  AT  6PM   BIOTIN PO Take 1 tablet by mouth daily.   cetirizine 10 MG tablet Commonly known as:  ZYRTEC Take 10 mg by mouth daily.   GERITOL COMPLETE PO Take 1 tablet by mouth daily.   isosorbide mononitrate 30 MG 24 hr tablet Commonly known as:  IMDUR TAKE 1/2 TABLET EVERY DAY   liraglutide 18 MG/3ML Sopn Commonly known as:  VICTOZA Inject 1.2 mg into the skin daily.   metFORMIN 1000 MG tablet Commonly known as:  GLUCOPHAGE Take 1,000 mg by mouth 2 (two) times daily.   metoprolol tartrate 25 MG tablet Commonly known as:  LOPRESSOR Take one and a half (1.5) tablets (37.5 mg) by mouth twice (2) daily.   nitroGLYCERIN 0.4 MG SL tablet Commonly known as:  NITROSTAT Place 1 tablet (0.4 mg total) under the tongue every 5 (five) minutes as needed  for chest pain. MAX 3 doses   ranitidine 150 MG tablet Commonly known as:  ZANTAC Take 150 mg by mouth 2 (two) times daily.   Vitamin D 2000 units tablet Take 2,000 Units by mouth daily. Not taking yet       Allergies:   Patient has no known allergies.   Social History   Socioeconomic History  . Marital status: Widowed    Spouse name: Not on file  . Number of children: 2  . Years of education: college  . Highest education level: Not on file  Occupational History  . Occupation: Disability  Social Needs  . Financial resource strain: Not on file  .  Food insecurity:    Worry: Not on file    Inability: Not on file  . Transportation needs:    Medical: Not on file    Non-medical: Not on file  Tobacco Use  . Smoking status: Former Smoker    Packs/day: 0.50    Years: 36.00    Pack years: 18.00    Types: Cigarettes    Last attempt to quit: 06/12/2014    Years since quitting: 3.4  . Smokeless tobacco: Never Used  Substance and Sexual Activity  . Alcohol use: No    Alcohol/week: 0.0 standard drinks  . Drug use: No  . Sexual activity: Never  Lifestyle  . Physical activity:    Days per week: Not on file    Minutes per session: Not on file  . Stress: Not on file  Relationships  . Social connections:    Talks on phone: Not on file    Gets together: Not on file    Attends religious service: Not on file    Active member of club or organization: Not on file    Attends meetings of clubs or organizations: Not on file    Relationship status: Not on file  Other Topics Concern  . Not on file  Social History Narrative   Patient currently lives at home with her disabled husband who was shot in 2009 and he is total care. She lives with her son and daughter and there is an 8 that comes in. Patient currently is on disability   She occasionally drinks caffeine.      Family History:  The patient's family history includes Alzheimer's disease in her unknown relative; Breast cancer in  her mother and unknown relative; Depression in her son; Diabetes in her mother and unknown relative; Heart failure in her mother; Stroke in her maternal aunt.   ROS:   Please see the history of present illness.    ROS All other systems reviewed and are negative.   PHYSICAL EXAM:   VS:  BP (!) 138/96 (BP Location: Left Arm, Patient Position: Sitting, Cuff Size: Normal)   Pulse 92   Ht 5\' 6"  (1.676 m)   Wt 220 lb (99.8 kg)   BMI 35.51 kg/m    GENERAL:  Well appearing, overweight BF in NAD HEENT:  PERRL, EOMI, sclera are clear. Oropharynx is clear. NECK:  No jugular venous distention, carotid upstroke brisk and symmetric, no bruits, no thyromegaly or adenopathy LUNGS:  Clear to auscultation bilaterally CHEST:  Unremarkable HEART:  RRR,  PMI not displaced or sustained,S1 and S2 within normal limits, no S3, no S4: no clicks, no rubs, no murmurs ABD:  Soft, nontender. BS +, no masses or bruits. No hepatomegaly, no splenomegaly EXT:  2 + pulses throughout, no edema, no cyanosis no clubbing SKIN:  Warm and dry.  No rashes NEURO:  Alert and oriented x 3. Cranial nerves II through XII intact. PSYCH:  Cognitively intact      Wt Readings from Last 3 Encounters:  11/12/17 220 lb (99.8 kg)  04/02/17 219 lb 12.8 oz (99.7 kg)  03/03/17 212 lb 4.8 oz (96.3 kg)      Studies/Labs Reviewed:   EKG:  EKG is  ordered today. NSR with old inferoposterior infarct. I have personally reviewed and interpreted this study.    Recent Labs: 03/02/2017: B Natriuretic Peptide 79.5 03/03/2017: BUN 15; Creatinine, Ser 0.99; Hemoglobin 15.0; Platelets 265; Potassium 3.7; Sodium 137   Lipid Panel    Component Value Date/Time  CHOL 157 09/19/2016 0900   TRIG 89 09/19/2016 0900   HDL 62 09/19/2016 0900   CHOLHDL 2.5 09/19/2016 0900   CHOLHDL 2.9 10/05/2015 0305   VLDL 28 10/05/2015 0305   LDLCALC 77 09/19/2016 0900    Additional studies/ records that were reviewed today include:   Labs from  primary care: 10/22/15: cholesterol 151, triglycerides 180, LDL 67, HDL 48. A1c 7.6%. CMET and CBC normal.  Dated 11/25/16: cholesterol 183, triglycerides 117, HDL 62, LDL 98. A1c 7.1%.    Echocardiogram: 10/05/15 LV EF: 55% -   60%  ------------------------------------------------------------------- Indications:      CAD of native vessels 414.01.  ------------------------------------------------------------------- History:   Risk factors:  Hypertension. Diabetes mellitus. Dyslipidemia.  ------------------------------------------------------------------- Study Conclusions  - Left ventricle: The cavity size was normal. Wall thickness was   increased in a pattern of mild LVH. Systolic function was normal.   The estimated ejection fraction was in the range of 55% to 60%.   Inferior wall hypokinesis. Doppler parameters are consistent with   abnormal left ventricular relaxation (grade 1 diastolic   dysfunction). The E/e&' ratio is between 8-15, suggesting   indeterminate LV filling pressure. - Ventricular septum: Septal motion showed abnormal function and   dyssynergy. - Aortic valve: Trileaflet. Sclerosis without stenosis. There was   no regurgitation. - Mitral valve: Mildly thickened leaflets . There was trivial   regurgitation. - Right ventricle: The cavity size was normal. Systolic function is   low normal. - Right atrium: The atrium was normal in size. - Tricuspid valve: There was trivial regurgitation. - Pulmonary arteries: PA peak pressure: 24 mm Hg (S). - Inferior vena cava: The vessel was normal in size. The   respirophasic diameter changes were in the normal range (>= 50%),   consistent with normal central venous pressure.  Impressions:  - Compared to a recent echo in 06/2015, the LVEF is stable at   55-60%, however, there is inferior hypokinesis and relatively   hyperdynamic anterior wall motion.  Cardiac Catheterization: 10/04/15    Prox LAD lesion, 85%  stenosed. Patent LIMA to LAD.  Severe native 3 vessel CAD.  Lat 1st Mrg-2 lesion, 100% stenosed. Occluded SVG to OM.  Ost RCA to Mid RCA lesion, 70% stenosed. Ocluded SVG to RCA,  Dist RCA lesion, 99% stenosed. Post intervention with a 2.25 x 12 Promus Premier, there is a 0% residual stenosis.  Normal LVEDP.   Switch to Brilinta from Plavix.  She needs aggressive secondary prevention.  Patient with residual ST elevation in the inferior leads but her chest pain has improved significantly.  Proximal RCA disease did not seem critical, compared to distal lesion.    Echo 03/03/18: Study Conclusions  - Left ventricle: The cavity size was normal. Wall thickness was   increased in a pattern of mild LVH. Systolic function was mildly   reduced. The estimated ejection fraction was in the range of 45%   to 50%. Severe inferior hypokinesis to akinesis. Doppler   parameters are consistent with abnormal left ventricular   relaxation (grade 1 diastolic dysfunction). The E/e&' ratio is   between 8-15, suggesting indeterminate LV filling pressure. - Aortic valve: Trileaflet. Sclerosis without stenosis. There was   no regurgitation. - Mitral valve: Mildly thickened leaflets . There was trivial   regurgitation. - Left atrium: The atrium was normal in size. - Inferior vena cava: The vessel was normal in size. The   respirophasic diameter changes were in the normal range (>= 50%),  consistent with normal central venous pressure.  Impressions:  - Compared to a prior study in 09/2015, the LVEF is lower at 45-50%   - global hypokinesis with inferior wall hypokinesis is noted.   ASSESSMENT & PLAN:    1. CAD s/p CABG 06/2015 with early graft failure.  - STEMI 10/04/15 with early loss of SVG to OM & SVG to RCA  s/p PCI to distal RCA lesion with a  Promus Premier stent. Patent LIMA to LAD.   -  She has only atypical chest pain currently that is more musculoskeletal - Continue ASA,  statin, metoprolol  and imdur.   2. HLD - Continue statin - noted her last LDL in September 2018  had increased from 67/77 up to 98. Will repeat lipid panel today. She is on high dose statin.   3. HTN - BP stable  - Will continue BB and imdur.   4. DM - Managed by PCP    Medication Adjustments/Labs and Tests Ordered: Current medicines are reviewed at length with the patient today.  Concerns regarding medicines are outlined above.  Medication changes, Labs and Tests ordered today are listed in the Patient Instructions below. Patient Instructions  We will check your cholesterol today  I will see you in 6 months.     Signed, Hai Grabe [place], MD  11/12/2017 10:30 AM    [facility] [address], [place], [place]  [address] Phone: [phone]; Fax: [phone]

## 2017-11-12 ENCOUNTER — Ambulatory Visit (INDEPENDENT_AMBULATORY_CARE_PROVIDER_SITE_OTHER): Payer: Medicare HMO | Admitting: Cardiology

## 2017-11-12 ENCOUNTER — Encounter: Payer: Self-pay | Admitting: Cardiology

## 2017-11-12 VITALS — BP 138/96 | HR 92 | Ht 66.0 in | Wt 220.0 lb

## 2017-11-12 DIAGNOSIS — I2510 Atherosclerotic heart disease of native coronary artery without angina pectoris: Secondary | ICD-10-CM | POA: Diagnosis not present

## 2017-11-12 DIAGNOSIS — E785 Hyperlipidemia, unspecified: Secondary | ICD-10-CM

## 2017-11-12 DIAGNOSIS — I10 Essential (primary) hypertension: Secondary | ICD-10-CM

## 2017-11-12 DIAGNOSIS — Z951 Presence of aortocoronary bypass graft: Secondary | ICD-10-CM | POA: Diagnosis not present

## 2017-11-12 LAB — LIPID PANEL W/O CHOL/HDL RATIO
Cholesterol, Total: 149 mg/dL (ref 100–199)
HDL: 59 mg/dL (ref >39)
LDL Calculated: 69 mg/dL (ref 0–99)
Triglycerides: 103 mg/dL (ref 0–149)
VLDL Cholesterol Cal: 21 mg/dL (ref 5–40)

## 2017-11-12 NOTE — Patient Instructions (Signed)
We will check your cholesterol today  I will see you in 6 months.

## 2017-11-30 DIAGNOSIS — M059 Rheumatoid arthritis with rheumatoid factor, unspecified: Secondary | ICD-10-CM | POA: Diagnosis not present

## 2017-11-30 DIAGNOSIS — Z23 Encounter for immunization: Secondary | ICD-10-CM | POA: Diagnosis not present

## 2017-11-30 DIAGNOSIS — F329 Major depressive disorder, single episode, unspecified: Secondary | ICD-10-CM | POA: Diagnosis not present

## 2017-11-30 DIAGNOSIS — E785 Hyperlipidemia, unspecified: Secondary | ICD-10-CM | POA: Diagnosis not present

## 2017-11-30 DIAGNOSIS — E1165 Type 2 diabetes mellitus with hyperglycemia: Secondary | ICD-10-CM | POA: Diagnosis not present

## 2017-11-30 DIAGNOSIS — Z1389 Encounter for screening for other disorder: Secondary | ICD-10-CM | POA: Diagnosis not present

## 2017-11-30 DIAGNOSIS — I2102 ST elevation (STEMI) myocardial infarction involving left anterior descending coronary artery: Secondary | ICD-10-CM | POA: Diagnosis not present

## 2017-11-30 DIAGNOSIS — Z0000 Encounter for general adult medical examination without abnormal findings: Secondary | ICD-10-CM | POA: Diagnosis not present

## 2017-11-30 DIAGNOSIS — Z7984 Long term (current) use of oral hypoglycemic drugs: Secondary | ICD-10-CM | POA: Diagnosis not present

## 2017-11-30 DIAGNOSIS — G4733 Obstructive sleep apnea (adult) (pediatric): Secondary | ICD-10-CM | POA: Diagnosis not present

## 2017-11-30 DIAGNOSIS — E119 Type 2 diabetes mellitus without complications: Secondary | ICD-10-CM | POA: Diagnosis not present

## 2017-11-30 DIAGNOSIS — I10 Essential (primary) hypertension: Secondary | ICD-10-CM | POA: Diagnosis not present

## 2017-12-18 DIAGNOSIS — L409 Psoriasis, unspecified: Secondary | ICD-10-CM | POA: Diagnosis not present

## 2017-12-18 DIAGNOSIS — Z9119 Patient's noncompliance with other medical treatment and regimen: Secondary | ICD-10-CM | POA: Diagnosis not present

## 2017-12-18 DIAGNOSIS — R768 Other specified abnormal immunological findings in serum: Secondary | ICD-10-CM | POA: Diagnosis not present

## 2017-12-18 DIAGNOSIS — Z6835 Body mass index (BMI) 35.0-35.9, adult: Secondary | ICD-10-CM | POA: Diagnosis not present

## 2017-12-18 DIAGNOSIS — M2550 Pain in unspecified joint: Secondary | ICD-10-CM | POA: Diagnosis not present

## 2017-12-18 DIAGNOSIS — R8281 Pyuria: Secondary | ICD-10-CM | POA: Diagnosis not present

## 2017-12-18 DIAGNOSIS — E669 Obesity, unspecified: Secondary | ICD-10-CM | POA: Diagnosis not present

## 2017-12-31 ENCOUNTER — Other Ambulatory Visit: Payer: Self-pay | Admitting: Cardiology

## 2018-01-22 DIAGNOSIS — I10 Essential (primary) hypertension: Secondary | ICD-10-CM | POA: Diagnosis not present

## 2018-01-22 DIAGNOSIS — E785 Hyperlipidemia, unspecified: Secondary | ICD-10-CM | POA: Diagnosis not present

## 2018-01-22 DIAGNOSIS — I5033 Acute on chronic diastolic (congestive) heart failure: Secondary | ICD-10-CM | POA: Diagnosis not present

## 2018-01-22 DIAGNOSIS — E1121 Type 2 diabetes mellitus with diabetic nephropathy: Secondary | ICD-10-CM | POA: Diagnosis not present

## 2018-01-22 DIAGNOSIS — E1165 Type 2 diabetes mellitus with hyperglycemia: Secondary | ICD-10-CM | POA: Diagnosis not present

## 2018-01-22 DIAGNOSIS — M059 Rheumatoid arthritis with rheumatoid factor, unspecified: Secondary | ICD-10-CM | POA: Diagnosis not present

## 2018-01-22 DIAGNOSIS — F329 Major depressive disorder, single episode, unspecified: Secondary | ICD-10-CM | POA: Diagnosis not present

## 2018-01-22 DIAGNOSIS — I2510 Atherosclerotic heart disease of native coronary artery without angina pectoris: Secondary | ICD-10-CM | POA: Diagnosis not present

## 2018-01-22 DIAGNOSIS — I2102 ST elevation (STEMI) myocardial infarction involving left anterior descending coronary artery: Secondary | ICD-10-CM | POA: Diagnosis not present

## 2018-02-22 DIAGNOSIS — E1121 Type 2 diabetes mellitus with diabetic nephropathy: Secondary | ICD-10-CM | POA: Diagnosis not present

## 2018-02-22 DIAGNOSIS — F329 Major depressive disorder, single episode, unspecified: Secondary | ICD-10-CM | POA: Diagnosis not present

## 2018-02-22 DIAGNOSIS — I2510 Atherosclerotic heart disease of native coronary artery without angina pectoris: Secondary | ICD-10-CM | POA: Diagnosis not present

## 2018-02-22 DIAGNOSIS — R768 Other specified abnormal immunological findings in serum: Secondary | ICD-10-CM | POA: Diagnosis not present

## 2018-02-22 DIAGNOSIS — M2550 Pain in unspecified joint: Secondary | ICD-10-CM | POA: Diagnosis not present

## 2018-02-22 DIAGNOSIS — I5033 Acute on chronic diastolic (congestive) heart failure: Secondary | ICD-10-CM | POA: Diagnosis not present

## 2018-02-22 DIAGNOSIS — E1165 Type 2 diabetes mellitus with hyperglycemia: Secondary | ICD-10-CM | POA: Diagnosis not present

## 2018-02-22 DIAGNOSIS — Z9119 Patient's noncompliance with other medical treatment and regimen: Secondary | ICD-10-CM | POA: Diagnosis not present

## 2018-02-22 DIAGNOSIS — L4050 Arthropathic psoriasis, unspecified: Secondary | ICD-10-CM | POA: Diagnosis not present

## 2018-02-22 DIAGNOSIS — L409 Psoriasis, unspecified: Secondary | ICD-10-CM | POA: Diagnosis not present

## 2018-02-22 DIAGNOSIS — E669 Obesity, unspecified: Secondary | ICD-10-CM | POA: Diagnosis not present

## 2018-02-22 DIAGNOSIS — I2102 ST elevation (STEMI) myocardial infarction involving left anterior descending coronary artery: Secondary | ICD-10-CM | POA: Diagnosis not present

## 2018-02-22 DIAGNOSIS — I10 Essential (primary) hypertension: Secondary | ICD-10-CM | POA: Diagnosis not present

## 2018-02-22 DIAGNOSIS — M059 Rheumatoid arthritis with rheumatoid factor, unspecified: Secondary | ICD-10-CM | POA: Diagnosis not present

## 2018-02-22 DIAGNOSIS — Z6836 Body mass index (BMI) 36.0-36.9, adult: Secondary | ICD-10-CM | POA: Diagnosis not present

## 2018-02-22 DIAGNOSIS — E785 Hyperlipidemia, unspecified: Secondary | ICD-10-CM | POA: Diagnosis not present

## 2018-03-31 DIAGNOSIS — M059 Rheumatoid arthritis with rheumatoid factor, unspecified: Secondary | ICD-10-CM | POA: Diagnosis not present

## 2018-03-31 DIAGNOSIS — F329 Major depressive disorder, single episode, unspecified: Secondary | ICD-10-CM | POA: Diagnosis not present

## 2018-03-31 DIAGNOSIS — I10 Essential (primary) hypertension: Secondary | ICD-10-CM | POA: Diagnosis not present

## 2018-03-31 DIAGNOSIS — E1121 Type 2 diabetes mellitus with diabetic nephropathy: Secondary | ICD-10-CM | POA: Diagnosis not present

## 2018-03-31 DIAGNOSIS — E785 Hyperlipidemia, unspecified: Secondary | ICD-10-CM | POA: Diagnosis not present

## 2018-03-31 DIAGNOSIS — I5033 Acute on chronic diastolic (congestive) heart failure: Secondary | ICD-10-CM | POA: Diagnosis not present

## 2018-03-31 DIAGNOSIS — E1165 Type 2 diabetes mellitus with hyperglycemia: Secondary | ICD-10-CM | POA: Diagnosis not present

## 2018-03-31 DIAGNOSIS — I2102 ST elevation (STEMI) myocardial infarction involving left anterior descending coronary artery: Secondary | ICD-10-CM | POA: Diagnosis not present

## 2018-03-31 DIAGNOSIS — I2510 Atherosclerotic heart disease of native coronary artery without angina pectoris: Secondary | ICD-10-CM | POA: Diagnosis not present

## 2018-04-15 ENCOUNTER — Encounter: Payer: Self-pay | Admitting: Cardiology

## 2018-04-26 DIAGNOSIS — I5033 Acute on chronic diastolic (congestive) heart failure: Secondary | ICD-10-CM | POA: Diagnosis not present

## 2018-04-26 DIAGNOSIS — I2102 ST elevation (STEMI) myocardial infarction involving left anterior descending coronary artery: Secondary | ICD-10-CM | POA: Diagnosis not present

## 2018-04-26 DIAGNOSIS — I10 Essential (primary) hypertension: Secondary | ICD-10-CM | POA: Diagnosis not present

## 2018-04-26 DIAGNOSIS — F329 Major depressive disorder, single episode, unspecified: Secondary | ICD-10-CM | POA: Diagnosis not present

## 2018-04-26 DIAGNOSIS — I2510 Atherosclerotic heart disease of native coronary artery without angina pectoris: Secondary | ICD-10-CM | POA: Diagnosis not present

## 2018-04-26 DIAGNOSIS — E1121 Type 2 diabetes mellitus with diabetic nephropathy: Secondary | ICD-10-CM | POA: Diagnosis not present

## 2018-04-26 DIAGNOSIS — E1165 Type 2 diabetes mellitus with hyperglycemia: Secondary | ICD-10-CM | POA: Diagnosis not present

## 2018-04-26 DIAGNOSIS — E785 Hyperlipidemia, unspecified: Secondary | ICD-10-CM | POA: Diagnosis not present

## 2018-04-26 DIAGNOSIS — M059 Rheumatoid arthritis with rheumatoid factor, unspecified: Secondary | ICD-10-CM | POA: Diagnosis not present

## 2018-05-10 ENCOUNTER — Ambulatory Visit: Payer: Medicare HMO | Admitting: Cardiology

## 2018-06-09 ENCOUNTER — Other Ambulatory Visit: Payer: Self-pay | Admitting: *Deleted

## 2018-06-09 MED ORDER — NITROGLYCERIN 0.4 MG SL SUBL: 0.4000 mg | SUBLINGUAL_TABLET | SUBLINGUAL | 1 refills | Status: DC | PRN

## 2018-06-18 ENCOUNTER — Telehealth: Payer: Self-pay | Admitting: Cardiology

## 2018-06-18 MED ORDER — ISOSORBIDE MONONITRATE ER 30 MG PO TB24: 15.0000 mg | ORAL_TABLET | Freq: Every day | ORAL | 3 refills | Status: DC

## 2018-06-18 NOTE — Telephone Encounter (Signed)
OK to refill Imdur  Brinkley Peet Martin[place] MD, [facility]

## 2018-06-18 NOTE — Telephone Encounter (Signed)
Refill sent to pharmacy.   

## 2018-06-18 NOTE — Telephone Encounter (Signed)
°*  STAT* If patient is at the pharmacy, call can be transferred to refill team.   1. Which medications need to be refilled? (please list name of each medication and dose if known)  isosorbide mononitrate (IMDUR) 30 MG 24 hr tablet  2. Which pharmacy/location (including street and city if local pharmacy) is medication to be sent to? [facility], [place]  3. Do they need a 30 day or 90 day supply?90 days  Pharmacy wanted to make you aware this is an XR tablet and should not be cut in half as patient is doing, if provider is ok with patient doing this it needs to be marked on the prescription so it won't hold it up and the won't have to call to verify it.

## 2018-06-18 NOTE — Addendum Note (Signed)
Addended by: Kathyrn Lass on: 06/18/2018 03:58 PM   Modules accepted: Orders

## 2018-06-21 ENCOUNTER — Other Ambulatory Visit: Payer: Self-pay

## 2018-06-21 MED ORDER — ISOSORBIDE MONONITRATE ER 30 MG PO TB24: 15.0000 mg | ORAL_TABLET | Freq: Every day | ORAL | 3 refills | Status: DC

## 2018-06-22 ENCOUNTER — Other Ambulatory Visit: Payer: Self-pay | Admitting: *Deleted

## 2018-06-23 ENCOUNTER — Telehealth: Payer: Self-pay | Admitting: Cardiology

## 2018-06-23 NOTE — Telephone Encounter (Signed)
New Message         Pharmacy  Is calling for clarification "Imdur" Ref # [id number]

## 2018-06-23 NOTE — Telephone Encounter (Signed)
Called pharmacy, advised PharmD that correct dose and sig was sent in per previous note from MD.

## 2018-06-28 ENCOUNTER — Telehealth: Payer: Self-pay | Admitting: Cardiology

## 2018-06-28 NOTE — Progress Notes (Deleted)
{Choose 1 Note Type (Telehealth Visit or Telephone Visit):[phone]}   Evaluation Performed:  Follow-up visit  Date:  06/28/2018   ID:  [name] [name], DOB [birth year], MRN [id number]  {Patient Location:[phone]::"Home"}  {Provider Location:[id number]}  PCP:  Leeroy Cha, MD  Cardiologist:  Reakwon Barren Martinique, MD *** Electrophysiologist:  None   Chief Complaint:  ***  History of Present Illness:    [name] [name] is a 58 y.o. female who presents via audio/video conferencing for a telehealth visit today.  She has a PMH of CAD (s/p CABG on 06/19/2015 w/ LIMA-LAD, SVG-RCA, SVG-OM2), Lupus, Type 2 DM, HTN, and HLD who is seen for follow up CAD.   In April 2017 hospitalized with NSTEMI and cath withsevere 3-vessel disease with 80% RCA stenosis, 85% Prox LAD stenosis, and 90% 1st Mrg with 85% 2nd Mrg. CABG was recommended and this was performed on 4/4 with the LIMA-LAD, SVG-RCA, and SVG-OM2. She was started on ASA and Plavix.  She presented 10/04/15 to [facility] with complained of chest discomfort for the past 3 weeks. Upon arrival to the ED, she was noted to have ST elevation in the inferior and lateral leads of her EKG. Emergently to cardiac catheterization revealed Patent LIMA to the LAD, occluded SVG to OM, occluded SVG to RCA. Distal RCA 99% stenosed and she underwent emergent PCI with Promus DES to distal RCA. Her troponin peaked at 14. Plavix changed to [place]. Pt had persistent ST elevation inferiorly and tachycardia. Increased BB.Echo with EF 55-60% inferior hypokinesis and relatively hyperdynamic anterior wall motion.  Zocor changed to lipitor.   She was seen in December 2018 with atypical lateral chest pain, shoulder pain- worse with deep breathing. Troponin levels were normal. She had colonoscopy with polypectomy several days prior. There was mild edema noted on CXR and she was given IV lasix x1. Echo showed EF 45-50% that on my review was not significantly changed from prior.  She was observed overnight and DC the next day.   ***  The patient {does/does not:200015} have symptoms concerning for COVID-19 infection (fever, chills, cough, or new shortness of breath).    Past Medical History:  Diagnosis Date  . Acute ST elevation myocardial infarction (STEMI) involving right coronary artery ([place]) 10/04/2015  . Anxiety   . Anxiety and depression   . CAD (coronary artery disease)    a. CABG 06/2015: LIMA-LAD, SVG-RCA, SVG-OM2 b. STEMI s/p PCI to distal RCA lesion with a small Promus Premier stent. ( patent LIMA to the LAD, occluded SVG to OM, occluded SVG to RCA.)  . CAD (coronary artery disease) of artery bypass graft; occluded SVG to RCA and occluded SVG to OM 10/08/2015  . Empty sella ([place])   . Hidradenitis axillaris   . Hyperlipemia   . Hypertension   . Kidney stones   . Lupus ([place]) dx'd 2008   "they think I have this; have to get retested" (06/12/2015)  . Obesity (BMI 30.0-34.9)   . Ovarian cyst   . Pneumonia X 2  . PONV (postoperative nausea and vomiting)   . Rheumatoid aortitis   . Rheumatoid arthritis ([place])   . S/P angioplasty with stent; 10/04/15 to distal RCA lesion. with Promus premier. 10/08/2015  . Scoliosis   . Stroke [facility] March 2014   left MCA branches; "they say I have them all the time", denies residual on 06/12/2015  . Tobacco abuse   . Type II diabetes mellitus ([place])    Past Surgical History:  Procedure Laterality Date  .  ABDOMINAL HYSTERECTOMY    . BREAST BIOPSY Left   . CARDIAC CATHETERIZATION N/A 06/13/2015   Procedure: Left Heart Cath and Coronary Angiography;  Surgeon: Dylann Gallier M Martinique, MD;  Location: [place] CV [facility];  Service: Cardiovascular;  Laterality: N/A;  . CARDIAC CATHETERIZATION N/A 10/04/2015   Procedure: Left Heart Cath and Coronary Angiography;  Surgeon: [name], MD;  Location: [place] CV [facility];  Service: Cardiovascular;  Laterality: N/A;  . CARDIAC CATHETERIZATION N/A 10/04/2015   Procedure: Coronary Stent  Intervention;  Surgeon: [name], MD;  Location: [place] CV [facility];  Service: Cardiovascular;  Laterality: N/A;  . CORONARY ARTERY BYPASS GRAFT N/A 06/19/2015   Procedure: CORONARY ARTERY BYPASS GRAFTING (CABG) TIMES FOUR USING LEFT INTERNAL MAMMARY, RIGHT SAPHENOUS LEG VEIN AND CRYO SAPHENOUS VEIN;  Surgeon: [name], MD;  Location: [place];  Service: Open Heart Surgery;  Laterality: N/A;  . DILATION AND CURETTAGE OF UTERUS    . TEE WITHOUT CARDIOVERSION N/A 06/19/2015   Procedure: TRANSESOPHAGEAL ECHOCARDIOGRAM (TEE);  Surgeon: [name], MD;  Location: [place];  Service: Open Heart Surgery;  Laterality: N/A;  . TONSILLECTOMY  1960s  . TUBAL LIGATION       No outpatient medications have been marked as taking for the 06/29/18 encounter (Appointment) with Martinique, Song Myre M, MD.     Allergies:   Patient has no known allergies.   Social History   Tobacco Use  . Smoking status: Former Smoker    Packs/day: 0.50    Years: 36.00    Pack years: 18.00    Types: Cigarettes    Last attempt to quit: 06/12/2014    Years since quitting: 4.0  . Smokeless tobacco: Never Used  Substance Use Topics  . Alcohol use: No    Alcohol/week: 0.0 standard drinks  . Drug use: No     Family Hx: The patient's family history includes Alzheimer's disease in an other family member; Breast cancer in her mother and another family member; Depression in her son; Diabetes in her mother and another family member; Heart failure in her mother; Stroke in her maternal aunt.  ROS:   Please see the history of present illness.    *** All other systems reviewed and are negative.   Prior CV studies:   The following studies were reviewed today:  Echo 03/03/17: Study Conclusions  - Left ventricle: The cavity size was normal. Wall thickness was   increased in a pattern of mild LVH. Systolic function was mildly   reduced. The estimated ejection fraction was in the range of 45%   to 50%. Severe inferior  hypokinesis to akinesis. Doppler   parameters are consistent with abnormal left ventricular   relaxation (grade 1 diastolic dysfunction). The E/e&' ratio is   between 8-15, suggesting indeterminate LV filling pressure. - Aortic valve: Trileaflet. Sclerosis without stenosis. There was   no regurgitation. - Mitral valve: Mildly thickened leaflets . There was trivial   regurgitation. - Left atrium: The atrium was normal in size. - Inferior vena cava: The vessel was normal in size. The   respirophasic diameter changes were in the normal range (>= 50%),   consistent with normal central venous pressure.  Impressions:  - Compared to a prior study in 09/2015, the LVEF is lower at 45-50%   - global hypokinesis with inferior wall hypokinesis is noted.   Labs/Other Tests and Data Reviewed:    EKG:  No ECG reviewed.  Recent Labs: No results found for  requested labs within last 8760 hours.   Recent Lipid Panel Lab Results  Component Value Date/Time   CHOL 149 11/12/2017 10:49 AM   TRIG 103 11/12/2017 10:49 AM   HDL 59 11/12/2017 10:49 AM   CHOLHDL 2.5 09/19/2016 09:00 AM   CHOLHDL 2.9 10/05/2015 03:05 AM   LDLCALC 69 11/12/2017 10:49 AM    Wt Readings from Last 3 Encounters:  11/12/17 220 lb (99.8 kg)  04/02/17 219 lb 12.8 oz (99.7 kg)  03/03/17 212 lb 4.8 oz (96.3 kg)     Objective:    Vital Signs:  There were no vitals taken for this visit.   Well nourished, well developed female in no*** acute distress. ***  ASSESSMENT & PLAN:    1. CAD s/p CABG 06/2015 with early graft failure.  - STEMI 10/04/15 with early loss of SVG to OM & SVG to RCA  s/p PCI to distal RCA lesion with a  Promus Premier stent. Patent LIMA to LAD.   -  She has only atypical chest pain currently that is more musculoskeletal - Continue ASA,  statin, metoprolol and imdur.   2. HLD - Continue statin - noted her last LDL in September 2018  had increased from 67/77 up to 98. Will repeat lipid panel  today. She is on high dose statin.   3. HTN - BP stable  - Will continue BB and imdur.   4. DM - Managed by PCP   COVID-19 Education: The signs and symptoms of COVID-19 were discussed with the patient and how to seek care for testing (follow up with PCP or arrange E-visit).  ***The importance of social distancing was discussed today.  Time:   Today, I have spent *** minutes with the patient with telehealth technology discussing the above problems.     Medication Adjustments/Labs and Tests Ordered: Current medicines are reviewed at length with the patient today.  Concerns regarding medicines are outlined above.  Tests Ordered: No orders of the defined types were placed in this encounter.  Medication Changes: No orders of the defined types were placed in this encounter.   Disposition:  Follow up {follow up:15908}  Signed, Kelise Kuch Martinique, MD  06/28/2018 9:05 AM    [place]

## 2018-06-28 NOTE — Telephone Encounter (Signed)
Smart phone/MyChart/pre reg complete 06-28-18 ST Patient consents to phone visit. 06-28-18 ST

## 2018-06-28 NOTE — Telephone Encounter (Signed)
LVM to pre reg. 06-28-18 ST °

## 2018-06-28 NOTE — Telephone Encounter (Signed)
Follow Up:; ° ° °Returning your call. °

## 2018-06-29 ENCOUNTER — Telehealth: Payer: Medicare Other | Admitting: Cardiology

## 2018-06-29 ENCOUNTER — Telehealth (INDEPENDENT_AMBULATORY_CARE_PROVIDER_SITE_OTHER): Payer: Medicare Other | Admitting: Cardiology

## 2018-06-29 ENCOUNTER — Encounter: Payer: Self-pay | Admitting: Cardiology

## 2018-06-29 VITALS — BP 128/71 | HR 71 | Ht 66.0 in | Wt 216.5 lb

## 2018-06-29 DIAGNOSIS — E785 Hyperlipidemia, unspecified: Secondary | ICD-10-CM

## 2018-06-29 DIAGNOSIS — I10 Essential (primary) hypertension: Secondary | ICD-10-CM

## 2018-06-29 DIAGNOSIS — I2510 Atherosclerotic heart disease of native coronary artery without angina pectoris: Secondary | ICD-10-CM

## 2018-06-29 DIAGNOSIS — Z951 Presence of aortocoronary bypass graft: Secondary | ICD-10-CM

## 2018-06-29 NOTE — Patient Instructions (Signed)
Medication Instructions:  Continue same medications If you need a refill on your cardiac medications before your next appointment, please call your pharmacy.   Lab work: None ordered   Testing/Procedures: None ordered  Follow-Up: At [facility], you and your health needs are our priority.  As part of our continuing mission to provide you with exceptional heart care, we have created designated Provider Care Teams.  These Care Teams include your primary Cardiologist (physician) and Advanced Practice Providers (APPs -  Physician Assistants and Nurse Practitioners) who all work together to provide you with the care you need, when you need it. . Schedule follow up visit with Dr.Jordan in 6 months.  Call 3 months before to schedule.

## 2018-06-29 NOTE — Progress Notes (Signed)
Virtual Visit via Telephone Note   This visit type was conducted due to national recommendations for restrictions regarding the COVID-19 Pandemic (e.g. social distancing) in an effort to limit this patient's exposure and mitigate transmission in our community.  Due to her co-morbid illnesses, this patient is at least at moderate risk for complications without adequate follow up.  This format is felt to be most appropriate for this patient at this time.  The patient did not have access to video technology/had technical difficulties with video requiring transitioning to audio format only (telephone).  All issues noted in this document were discussed and addressed.  No physical exam could be performed with this format.  Please refer to the patient's chart for her  consent to telehealth for [facility].   Evaluation Performed:  Follow-up visit  Date:  06/29/2018   ID:  [name] [name], DOB 27-Mar-1960, MRN [id number]  Patient Location: Home  Provider Location: Home  PCP:  Leeroy Cha, MD  Cardiologist:  Tramell Piechota Martinique, MD  Electrophysiologist:  None   Chief Complaint:  Follow up CAD  History of Present Illness:    [name] [name] is a 58 y.o. female who presents via audio/video conferencing for a telehealth visit today.    She has apast medical history of CAD (s/p CABG on 06/19/2015 w/ LIMA-LAD, SVG-RCA, SVG-OM2), Lupus, Type 2 DM, HTN, and HLD.    She presented 10/04/15 to [facility] with complained of chest discomfort for the past 3 weeks. Upon arrival to the ED, she was noted to have ST elevation in the inferior and lateral leads of her EKG. Emergently to cardiac catheterization revealed Patent LIMA to the LAD, occluded SVG to OM, occluded SVG to RCA. Distal RCA 99% stenosed and she underwent emergent PCI with Promus DES to distal RCA. Her troponin peaked at 14. Plavix changed to [place]. Pt had persistent ST elevation inferiorly and tachycardia. Increased BB.Echo with EF 55-60%  inferior hypokinesis and relatively hyperdynamic anterior wall motion.  Zocor changed to lipitor. Was taken off Brilinta later.   She was seen in December 2018 with atypical lateral chest pain, shoulder pain- worse with deep breathing. Troponin levels were normal. She had colonoscopy with polypectomy several days prior. There was mild edema noted on CXR and she was given IV lasix x1. Echo showed EF 45-50% that on my review was not significantly changed from prior. She was observed overnight and DC the next day.  She is doing well from a cardiac standpoint. She denies any chest pain or SOB. No palpitations or dyspnea. She has been diagnosed with psoriatic arthritis. Followed by Rheumatology. No on [place] but this is making her nauseated. She does note BP is elevated in the am but returns to normal after taking medication. She has not been able to get to the dentist to get an oral appliance for her sleep apnea. She is keeping social distance.   The patient does not have symptoms concerning for COVID-19 infection (fever, chills, cough, or new shortness of breath).    Past Medical History:  Diagnosis Date  . Acute ST elevation myocardial infarction (STEMI) involving right coronary artery ([place]) 10/04/2015  . Anxiety   . Anxiety and depression   . CAD (coronary artery disease)    a. CABG 06/2015: LIMA-LAD, SVG-RCA, SVG-OM2 b. STEMI s/p PCI to distal RCA lesion with a small Promus Premier stent. ( patent LIMA to the LAD, occluded SVG to OM, occluded SVG to RCA.)  . CAD (coronary artery disease)  of artery bypass graft; occluded SVG to RCA and occluded SVG to OM 10/08/2015  . Empty sella ([place])   . Hidradenitis axillaris   . Hyperlipemia   . Hypertension   . Kidney stones   . Lupus ([place]) dx'd 2008   "they think I have this; have to get retested" (06/12/2015)  . Obesity (BMI 30.0-34.9)   . Ovarian cyst   . Pneumonia X 2  . PONV (postoperative nausea and vomiting)   . Rheumatoid aortitis   .  Rheumatoid arthritis ([place])   . S/P angioplasty with stent; 10/04/15 to distal RCA lesion. with Promus premier. 10/08/2015  . Scoliosis   . Stroke [facility]) March 2014   left MCA branches; "they say I have them all the time", denies residual on 06/12/2015  . Tobacco abuse   . Type II diabetes mellitus ([place])    Past Surgical History:  Procedure Laterality Date  . ABDOMINAL HYSTERECTOMY    . BREAST BIOPSY Left   . CARDIAC CATHETERIZATION N/A 06/13/2015   Procedure: Left Heart Cath and Coronary Angiography;  Surgeon: Monetta Lick M Martinique, MD;  Location: [place] CV LAB;  Service: Cardiovascular;  Laterality: N/A;  . CARDIAC CATHETERIZATION N/A 10/04/2015   Procedure: Left Heart Cath and Coronary Angiography;  Surgeon: [name], MD;  Location: [place] [facility];  Service: Cardiovascular;  Laterality: N/A;  . CARDIAC CATHETERIZATION N/A 10/04/2015   Procedure: Coronary Stent Intervention;  Surgeon: [name], MD;  Location: [place] CV LAB;  Service: Cardiovascular;  Laterality: N/A;  . CORONARY ARTERY BYPASS GRAFT N/A 06/19/2015   Procedure: CORONARY ARTERY BYPASS GRAFTING (CABG) TIMES FOUR USING LEFT INTERNAL MAMMARY, RIGHT SAPHENOUS LEG VEIN AND CRYO SAPHENOUS VEIN;  Surgeon: [name], MD;  Location: [place];  Service: Open Heart Surgery;  Laterality: N/A;  . DILATION AND CURETTAGE OF UTERUS    . TEE WITHOUT CARDIOVERSION N/A 06/19/2015   Procedure: TRANSESOPHAGEAL ECHOCARDIOGRAM (TEE);  Surgeon: [name], MD;  Location: [place];  Service: Open Heart Surgery;  Laterality: N/A;  . TONSILLECTOMY  1960s  . TUBAL LIGATION       No outpatient medications have been marked as taking for the 06/29/18 encounter (Telemedicine) with Martinique, Tamantha Saline M, MD.     Allergies:   Patient has no known allergies.   Social History   Tobacco Use  . Smoking status: Former Smoker    Packs/day: 0.50    Years: 36.00    Pack years: 18.00    Types: Cigarettes    Last attempt to quit: 06/12/2014     Years since quitting: 4.0  . Smokeless tobacco: Never Used  Substance Use Topics  . Alcohol use: No    Alcohol/week: 0.0 standard drinks  . Drug use: No     Family Hx: The patient's family history includes Alzheimer's disease in an other family member; Breast cancer in her mother and another family member; Depression in her son; Diabetes in her mother and another family member; Heart failure in her mother; Stroke in her maternal aunt.  ROS:   Please see the history of present illness.    All other systems reviewed and are negative.   Prior CV studies:   The following studies were reviewed today:  Echo 03/03/17: Study Conclusions  - Left ventricle: The cavity size was normal. Wall thickness was   increased in a pattern of mild LVH. Systolic function was mildly   reduced. The estimated ejection fraction was in the range of  45%   to 50%. Severe inferior hypokinesis to akinesis. Doppler   parameters are consistent with abnormal left ventricular   relaxation (grade 1 diastolic dysfunction). The E/e&' ratio is   between 8-15, suggesting indeterminate LV filling pressure. - Aortic valve: Trileaflet. Sclerosis without stenosis. There was   no regurgitation. - Mitral valve: Mildly thickened leaflets . There was trivial   regurgitation. - Left atrium: The atrium was normal in size. - Inferior vena cava: The vessel was normal in size. The   respirophasic diameter changes were in the normal range (>= 50%),   consistent with normal central venous pressure.   Labs/Other Tests and Data Reviewed:    EKG:  No ECG reviewed.  Recent Labs: No results found for requested labs within last 8760 hours.   Labs dated 11/30/17: chemistries and TSH normal.  Dated 05/21/18: A1c 7.3%.   Recent Lipid Panel Lab Results  Component Value Date/Time   CHOL 149 11/12/2017 10:49 AM   TRIG 103 11/12/2017 10:49 AM   HDL 59 11/12/2017 10:49 AM   CHOLHDL 2.5 09/19/2016 09:00 AM   CHOLHDL 2.9  10/05/2015 03:05 AM   LDLCALC 69 11/12/2017 10:49 AM    Wt Readings from Last 3 Encounters:  06/29/18 216 lb 8 oz (98.2 kg)  11/12/17 220 lb (99.8 kg)  04/02/17 219 lb 12.8 oz (99.7 kg)     Objective:    Vital Signs:  BP 128/71   Pulse 71   Ht 5\' 6"  (1.676 m)   Wt 216 lb 8 oz (98.2 kg)   SpO2 97%   BMI 34.94 kg/m     ASSESSMENT & PLAN:    1. CAD s/p CABG 06/2015 with early graft failure.  - STEMI 10/04/15 with early loss of SVG to OM & SVG to RCA  s/p PCI to distal RCA lesion with a  Promus Premier stent. Patent LIMA to LAD.   -  She has no anginal symptoms - Continue ASA,  statin, metoprolol and imdur.   2. HLD - Continue statin - noted her last LDL was 69 on high dose statin.   3. HTN - BP stable  - Will continue BB and imdur.   4. DM - Managed by PCP   5. Psoriatric arthritis- per Rheumatology  COVID-19 Education: The signs and symptoms of COVID-19 were discussed with the patient and how to seek care for testing (follow up with PCP or arrange E-visit).  The importance of social distancing was discussed today.  Time:   Today, I have spent 15 minutes with the patient with telehealth technology discussing the above problems.     Medication Adjustments/Labs and Tests Ordered: Current medicines are reviewed at length with the patient today.  Concerns regarding medicines are outlined above.   Tests Ordered: No orders of the defined types were placed in this encounter.   Medication Changes: No orders of the defined types were placed in this encounter.   Disposition:  Follow up in 6 month(s)  Signed, Sequoya Hogsett Martinique, MD  06/29/2018 1:29 PM    [facility]

## 2018-08-10 ENCOUNTER — Other Ambulatory Visit: Payer: Self-pay | Admitting: Cardiology

## 2018-08-10 NOTE — Telephone Encounter (Signed)
Rx request sent to pharmacy.  

## 2018-09-29 ENCOUNTER — Other Ambulatory Visit: Payer: Self-pay | Admitting: Internal Medicine

## 2018-09-29 DIAGNOSIS — R319 Hematuria, unspecified: Secondary | ICD-10-CM

## 2018-10-01 ENCOUNTER — Other Ambulatory Visit: Payer: Self-pay | Admitting: Internal Medicine

## 2018-10-01 DIAGNOSIS — Z1231 Encounter for screening mammogram for malignant neoplasm of breast: Secondary | ICD-10-CM

## 2018-10-06 ENCOUNTER — Ambulatory Visit
Admission: RE | Admit: 2018-10-06 | Discharge: 2018-10-06 | Disposition: A | Discharge status: Home / Self Care | Payer: Medicare Other | Source: Ambulatory Visit | Attending: Internal Medicine | Admitting: Internal Medicine

## 2018-10-06 DIAGNOSIS — R319 Hematuria, unspecified: Secondary | ICD-10-CM

## 2018-10-08 ENCOUNTER — Other Ambulatory Visit: Payer: Self-pay | Admitting: Internal Medicine

## 2018-10-08 DIAGNOSIS — N1330 Unspecified hydronephrosis: Secondary | ICD-10-CM

## 2018-10-19 ENCOUNTER — Other Ambulatory Visit: Payer: Medicare Other

## 2018-10-26 ENCOUNTER — Ambulatory Visit
Admission: RE | Admit: 2018-10-26 | Discharge: 2018-10-26 | Disposition: A | Discharge status: Home / Self Care | Payer: Medicare Other | Source: Ambulatory Visit | Attending: Internal Medicine | Admitting: Internal Medicine

## 2018-10-26 DIAGNOSIS — N1330 Unspecified hydronephrosis: Secondary | ICD-10-CM

## 2018-11-11 ENCOUNTER — Telehealth: Payer: Self-pay | Admitting: Cardiology

## 2018-11-11 NOTE — Telephone Encounter (Signed)
Please advise if okay for letter? Thanks!

## 2018-11-11 NOTE — Telephone Encounter (Signed)
  Patient is calling because she wants to do vocational rehab so that she can go back to work. They stated that she will need a letter stating that it is okay for her to go to work and what her restrictions are.

## 2018-11-12 NOTE — Telephone Encounter (Signed)
OK to do vocational rehab. No cardiac limitations  Sabian Kuba Martinique MD, [facility]

## 2018-11-12 NOTE — Telephone Encounter (Signed)
I would say less than 20 lbs  [name] MD, [facility]

## 2018-11-12 NOTE — Telephone Encounter (Signed)
Left message on patient's personal voice mail Dr.Jordan's recommendation.Advised I will mail letter on Mon 11/15/18.

## 2018-11-12 NOTE — Telephone Encounter (Signed)
Spoke to patient she wanted to ask Dr.Jordan how many lbs is ok to lift.Message sent to [place] for advice.

## 2018-11-15 ENCOUNTER — Other Ambulatory Visit: Payer: Self-pay | Admitting: Cardiology

## 2018-11-17 ENCOUNTER — Ambulatory Visit: Payer: Medicare Other

## 2018-12-09 ENCOUNTER — Other Ambulatory Visit (HOSPITAL_COMMUNITY): Payer: Self-pay | Admitting: Urology

## 2018-12-09 ENCOUNTER — Other Ambulatory Visit: Payer: Self-pay | Admitting: Urology

## 2018-12-09 DIAGNOSIS — N200 Calculus of kidney: Secondary | ICD-10-CM

## 2018-12-10 ENCOUNTER — Telehealth: Payer: Self-pay | Admitting: Cardiology

## 2018-12-10 NOTE — Telephone Encounter (Signed)
Dr. Martinique to review, last PCI was July 2017, ok with you to hold aspirin for 5 days prior to nephrolithotomy?   Please send your response to P CV DIV PREOP

## 2018-12-10 NOTE — Telephone Encounter (Signed)
New Message     [facility] Pre-operative Risk Assessment    Request for surgical clearance:  1. What type of surgery is being performed? Left Percutaneous Nephrolithotomy  2. When is this surgery scheduled? 01/10/19  3. What type of clearance is required (medical clearance vs. Pharmacy clearance to hold med vs. Both)? Both  4. Are there any medications that need to be held prior to surgery and how long? Hold Aspirin 5 day Prior   5. Practice name and name of physician performing surgery? Kathie Rhodes MD with [facility]   6. What is your office phone number [phone]   7.   What is your office fax number [phone]  8.   Anesthesia type (None, local, MAC, general) ? General   Jenkins Rouge 12/10/2018, 2:19 PM  _________________________________________________________________   (provider comments below)

## 2018-12-11 NOTE — Telephone Encounter (Signed)
Yes OK to hold ASA for surgery.  Laren Whaling Martinique MD, [facility]

## 2018-12-13 NOTE — Telephone Encounter (Signed)
   Primary Cardiologist: [name], MD  Chart reviewed as part of pre-operative protocol coverage. Patient was contacted 12/13/2018 in reference to pre-operative risk assessment for pending surgery as outlined below.  [name] [name] was last seen on 06/29/18 by Dr. Martinique.  Since that day, [name] [name] has done well.  She can complete more than 4.0 METS without anginal symptoms. She has a history of CAD s/p CABG with last PCI in 2017; last echo with midly reduced EF. She has a moderate but acceptable risk of cardiac complications during the perioperative period (6.6%).  Per Dr. Martinique,  [name] [name] to hold ASA for 5 days prior to the procedure.   Therefore, based on ACC/AHA guidelines, the patient would be at acceptable risk for the planned procedure without further cardiovascular testing.   I will route this recommendation to the requesting party via Epic fax function and remove from pre-op pool.  Please call with questions.  Tami Lin Giliana Vantil, PA 12/13/2018, 10:39 AM

## 2018-12-24 ENCOUNTER — Other Ambulatory Visit: Payer: Self-pay

## 2018-12-24 ENCOUNTER — Ambulatory Visit
Admission: RE | Admit: 2018-12-24 | Discharge: 2018-12-24 | Disposition: A | Discharge status: Home / Self Care | Payer: Medicare Other | Source: Ambulatory Visit | Attending: Internal Medicine | Admitting: Internal Medicine

## 2018-12-24 DIAGNOSIS — Z1231 Encounter for screening mammogram for malignant neoplasm of breast: Secondary | ICD-10-CM

## 2018-12-31 NOTE — Progress Notes (Signed)
PCP -  Cardiologist - Fabian Sharp, PA  w/ cardiac [place] on 12-10-18   Chest x-ray -  EKG -  Stress Test -  ECHO - 03-03-17  Cardiac Cath -   Sleep Study -  CPAP -   Fasting Blood Sugar -  Checks Blood Sugar _____ times a day  Blood Thinner Instructions: Aspirin Instructions:  To hold 5 days prior to surgery. Last Dose:  Anesthesia review:   Patient denies shortness of breath, fever, cough and chest pain at PAT appointment   Patient verbalized understanding of instructions that were given to them at the PAT appointment. Patient was also instructed that they will need to review over the PAT instructions again at home before surgery.

## 2019-01-03 ENCOUNTER — Other Ambulatory Visit: Payer: Self-pay

## 2019-01-03 ENCOUNTER — Encounter (HOSPITAL_COMMUNITY)
Admission: RE | Admit: 2019-01-03 | Discharge: 2019-01-03 | Disposition: A | Discharge status: Home / Self Care | Payer: Medicare Other | Source: Ambulatory Visit | Attending: Urology | Admitting: Urology

## 2019-01-03 ENCOUNTER — Encounter (HOSPITAL_COMMUNITY): Payer: Self-pay

## 2019-01-03 DIAGNOSIS — N200 Calculus of kidney: Secondary | ICD-10-CM | POA: Diagnosis not present

## 2019-01-03 DIAGNOSIS — Z01818 Encounter for other preprocedural examination: Secondary | ICD-10-CM | POA: Diagnosis not present

## 2019-01-03 DIAGNOSIS — I10 Essential (primary) hypertension: Secondary | ICD-10-CM | POA: Diagnosis not present

## 2019-01-03 LAB — BASIC METABOLIC PANEL
Anion gap: 10 (ref 5–15)
BUN: 14 mg/dL (ref 6–20)
CO2: 26 mmol/L (ref 22–32)
Calcium: 9.5 mg/dL (ref 8.9–10.3)
Chloride: 106 mmol/L (ref 98–111)
Creatinine, Ser: 0.99 mg/dL (ref 0.44–1.00)
GFR calc Af Amer: >60 mL/min (ref >60)
GFR calc non Af Amer: >60 mL/min (ref >60)
Glucose, Bld: 153 mg/dL — ABNORMAL HIGH (ref 70–99)
Potassium: 4 mmol/L (ref 3.5–5.1)
Sodium: 142 mmol/L (ref 135–145)

## 2019-01-03 LAB — GLUCOSE, CAPILLARY: Glucose-Capillary: 79 mg/dL (ref 70–99)

## 2019-01-03 LAB — CBC
HCT: 46.5 % — ABNORMAL HIGH (ref 36.0–46.0)
Hemoglobin: 14.2 g/dL (ref 12.0–15.0)
MCH: 28.2 pg (ref 26.0–34.0)
MCHC: 30.5 g/dL (ref 30.0–36.0)
MCV: 92.3 fL (ref 80.0–100.0)
Platelets: 350 10*3/uL (ref 150–400)
RBC: 5.04 MIL/uL (ref 3.87–5.11)
RDW: 15.9 % — ABNORMAL HIGH (ref 11.5–15.5)
WBC: 7.8 10*3/uL (ref 4.0–10.5)
nRBC: 0 % (ref 0.0–0.2)

## 2019-01-03 NOTE — Patient Instructions (Addendum)
DUE TO COVID-19 ONLY ONE VISITOR IS ALLOWED TO COME WITH YOU AND STAY IN THE WAITING ROOM ONLY DURING PRE OP AND PROCEDURE DAY OF SURGERY. THE 1 VISITOR MAY VISIT WITH YOU AFTER SURGERY IN YOUR PRIVATE ROOM DURING VISITING HOURS ONLY!  YOU NEED TO HAVE A COVID 19 TEST ON 01-06-19  @ 1:30 PM, THIS TEST MUST BE DONE BEFORE SURGERY, COME  [place], [place] , [id number].  ([place]) ONCE YOUR COVID TEST IS COMPLETED, PLEASE BEGIN THE QUARANTINE INSTRUCTIONS AS OUTLINED IN YOUR HANDOUT.                [name] [name]  01/03/2019   Your procedure is scheduled on: 01-10-19    Report to [facility] Main  Entrance    Report to Admitting at 8:00 AM to Interventional Radiology     Call this number if you have problems the morning of surgery [phone]    Remember: Do not eat food or drink liquids :After Midnight.      Take these medicines the morning of surgery with A SIP OF WATER: Certirizine, Isosorbide Mononitrate ( Imdur), and Metoprolol   BRUSH YOUR TEETH MORNING OF SURGERY AND RINSE YOUR MOUTH OUT, NO CHEWING GUM CANDY OR MINTS.  DO NOT TAKE ANY DIABETIC MEDICATIONS DAY OF YOUR SURGERY                               You may not have any metal on your body including hair pins and              piercings    Do not wear jewelry, make-up, lotions, powders or perfumes, deodorant              Do not wear nail polish on your fingernails.  Do not shave  48 hours prior to surgery.             Do not bring valuables to the hospital. [place].  Contacts, dentures or bridgework may not be worn into surgery.  Leave suitcase in the car. After surgery it may be brought to your room.     Special Instructions: N/A              Please read over the following fact sheets you were given: _____________________________________________________________________  How to Manage Your Diabetes Before and After  Surgery  Why is it important to control my blood sugar before and after surgery? . Improving blood sugar levels before and after surgery helps healing and can limit problems. . A way of improving blood sugar control is eating a healthy diet by: o  Eating less sugar and carbohydrates o  Increasing activity/exercise o  Talking with your doctor about reaching your blood sugar goals . High blood sugars (greater than 180 mg/dL) can raise your risk of infections and slow your recovery, so you will need to focus on controlling your diabetes during the weeks before surgery. . Make sure that the doctor who takes care of your diabetes knows about your planned surgery including the date and location.  How do I manage my blood sugar before surgery? . Check your blood sugar at least 4 times a day, starting 2 days before surgery, to make sure that the level is not too high or low. o Check  your blood sugar the morning of your surgery when you wake up and every 2 hours until you get to the Short Stay unit. . If your blood sugar is less than 70 mg/dL, you will need to treat for low blood sugar: o Do not take insulin. o Treat a low blood sugar (less than 70 mg/dL) with  cup of clear juice (cranberry or apple), 4 glucose tablets, OR glucose gel. o Recheck blood sugar in 15 minutes after treatment (to make sure it is greater than 70 mg/dL). If your blood sugar is not greater than 70 mg/dL on recheck, call [phone] for further instructions. . Report your blood sugar to the short stay nurse when you get to Short Stay.  . If you are admitted to the hospital after surgery: o Your blood sugar will be checked by the staff and you will probably be given insulin after surgery (instead of oral diabetes medicines) to make sure you have good blood sugar levels. o The goal for blood sugar control after surgery is 80-180 mg/dL.   WHAT DO I DO ABOUT MY DIABETES MEDICATION?  Marland Kitchen Do not take oral diabetes medicines  (pills) the morning of surgery.  . THE DAY BEFORE SURGERY, take your usual Metformin     Reviewed and Endorsed by [facility] Patient Education Committee, August 2015           [facility] - Preparing for Surgery Before surgery, you can play an important role.  Because skin is not sterile, your skin needs to be as free of germs as possible.  You can reduce the number of germs on your skin by washing with CHG (chlorahexidine gluconate) soap before surgery.  CHG is an antiseptic cleaner which kills germs and bonds with the skin to continue killing germs even after washing. Please DO NOT use if you have an allergy to CHG or antibacterial soaps.  If your skin becomes reddened/irritated stop using the CHG and inform your nurse when you arrive at Short Stay. Do not shave (including legs and underarms) for at least 48 hours prior to the first CHG shower.  You may shave your face/neck. Please follow these instructions carefully:  1.  Shower with CHG Soap the night before surgery and the  morning of Surgery.  2.  If you choose to wash your hair, wash your hair first as usual with your  normal  shampoo.  3.  After you shampoo, rinse your hair and body thoroughly to remove the  shampoo.                           4.  Use CHG as you would any other liquid soap.  You can apply chg directly  to the skin and wash                       Gently with a scrungie or clean washcloth.  5.  Apply the CHG Soap to your body ONLY FROM THE NECK DOWN.   Do not use on face/ open                           Wound or open sores. Avoid contact with eyes, ears mouth and genitals (private parts).                       Wash face,  Genitals (private parts) with  your normal soap.             6.  Wash thoroughly, paying special attention to the area where your surgery  will be performed.  7.  Thoroughly rinse your body with warm water from the neck down.  8.  DO NOT shower/wash with your normal soap after using and rinsing off  the CHG  Soap.                9.  Pat yourself dry with a clean towel.            10.  Wear clean pajamas.            11.  Place clean sheets on your bed the night of your first shower and do not  sleep with pets. Day of Surgery : Do not apply any lotions/deodorants the morning of surgery.  Please wear clean clothes to the hospital/surgery center.  FAILURE TO FOLLOW THESE INSTRUCTIONS MAY RESULT IN THE CANCELLATION OF YOUR SURGERY PATIENT SIGNATURE_________________________________  NURSE SIGNATURE__________________________________  ________________________________________________________________________

## 2019-01-04 LAB — HEMOGLOBIN A1C
Hgb A1c MFr Bld: 7.3 % — ABNORMAL HIGH (ref 4.8–5.6)
Mean Plasma Glucose: 162.81 mg/dL

## 2019-01-04 NOTE — Progress Notes (Signed)
Anesthesia Chart Review   Case: [id number] Date/Time: 01/10/19 1045   Procedure: NEPHROLITHOTOMY PERCUTANEOUS (Left )   Anesthesia type: General   Pre-op diagnosis: LEFT RENAL CALCULI   Location: [facility]   Surgeon: [name] Rhodes, MD      DISCUSSION:58 y.o. former smoker (18 pack years, quit 06/12/14) with h/o PONV, RA, CAD (CABG 2017), HTN, DM II, CVA 2014, left renal calculi scheduled for above procedure 01/10/2019 with Dr. Kathie [name].   Pt cleared by cardiology 12/13/2018.  Per [name] Sharp, [name], "[name] [name] was last seen on 06/29/18 by Dr. Martinique.  Since that day, [name] [name] has done well.  She can complete more than 4.0 METS without anginal symptoms. She has a history of CAD s/p CABG with last PCI in 2017; last echo with midly reduced EF. She has a moderate but acceptable risk of cardiac complications during the perioperative period (6.6%). Per Dr. Martinique,  [name] [name] to hold ASA for 5 days prior to the procedure.   Therefore, based on ACC/AHA guidelines, the patient would be at acceptable risk for the planned procedure without further cardiovascular testing."  Anticipate pt can proceed with planned procedure barring acute status change.   VS: BP (!) 170/98   Pulse 81   Temp 36.9 C (Oral)   Resp 16   Ht 5\' 6"  (1.676 m)   Wt 98.7 kg   SpO2 99%   BMI 35.12 kg/m   PROVIDERS: [name] Cha, MD is PCP   [name], Peter, MD is Cardiologist  LABS: Labs reviewed: Acceptable for surgery. (all labs ordered are listed, but only abnormal results are displayed)  Labs Reviewed  HEMOGLOBIN A1C - Abnormal; Notable for the following components:      Result Value   Hgb A1c MFr Bld 7.3 (*)    All other components within normal limits  BASIC METABOLIC PANEL - Abnormal; Notable for the following components:   Glucose, Bld 153 (*)    All other components within normal limits  CBC - Abnormal; Notable for the following components:   HCT 46.5 (*)    RDW 15.9 (*)     All other components within normal limits  GLUCOSE, CAPILLARY     IMAGES:   EKG: 01/03/2019 Rate 80 bpm Normal sinus rhythm Possible Left atrial enlargement Incomplete right bundle branch block Cannot rule out Anterior infarct , age undetermined Abnormal ECG  CV: Echo 03/03/2017 Study Conclusions  - Left ventricle: The cavity size was normal. Wall thickness was   increased in a pattern of mild LVH. Systolic function was mildly   reduced. The estimated ejection fraction was in the range of 45%   to 50%. Severe inferior hypokinesis to akinesis. Doppler   parameters are consistent with abnormal left ventricular   relaxation (grade 1 diastolic dysfunction). The E/e&' ratio is   between 8-15, suggesting indeterminate LV filling pressure. - Aortic valve: Trileaflet. Sclerosis without stenosis. There was   no regurgitation. - Mitral valve: Mildly thickened leaflets . There was trivial   regurgitation. - Left atrium: The atrium was normal in size. - Inferior vena cava: The vessel was normal in size. The   respirophasic diameter changes were in the normal range (>= 50%),   consistent with normal central venous pressure.  Impressions:  - Compared to a prior study in 09/2015, the LVEF is lower at 45-50%   - global hypokinesis with inferior wall hypokinesis is noted.  Cardiac Cath 10/04/2015  Prox LAD lesion, 85% stenosed.  Patent LIMA to LAD.  Severe native 3 vessel CAD.  Lat 1st Mrg-2 lesion, 100% stenosed. Occluded SVG to OM.  Ost RCA to Mid RCA lesion, 70% stenosed. Ocluded SVG to RCA,  Dist RCA lesion, 99% stenosed. Post intervention with a 2.25 x 12 Promus Premier, there is a 0% residual stenosis.  Normal LVEDP.   Switch to Brilinta from Plavix.  She needs aggressive secondary prevention.  Patient with residual ST elevation in the inferior leads but her chest pain has improved significantly.  Proximal RCA disease did not seem critical, compared to distal lesion.   Past Medical History:  Diagnosis Date  . Acute ST elevation myocardial infarction (STEMI) involving right coronary artery ([name] [name]) 10/04/2015  . Anxiety   . Anxiety and depression   . CAD (coronary artery disease)    a. CABG 06/2015: LIMA-LAD, SVG-RCA, SVG-OM2 b. STEMI s/p PCI to distal RCA lesion with a small Promus Premier stent. ( patent LIMA to the LAD, occluded SVG to OM, occluded SVG to RCA.)  . CAD (coronary artery disease) of artery bypass graft; occluded SVG to RCA and occluded SVG to OM 10/08/2015  . Empty sella ([name] [name])   . Hidradenitis axillaris   . Hyperlipemia   . Hypertension   . Kidney stones   . Lupus ([name] [name]) dx'd 2008   "they think I have this; have to get retested" (06/12/2015)  . Obesity (BMI 30.0-34.9)   . Ovarian cyst   . Pneumonia X 2  . PONV (postoperative nausea and vomiting)   . Rheumatoid aortitis   . Rheumatoid arthritis ([name] [name])   . S/P angioplasty with stent; 10/04/15 to distal RCA lesion. with Promus premier. 10/08/2015  . Scoliosis   . Stroke [name] [name]) March 2014   left MCA branches; "they say I have them all the time", denies residual on 06/12/2015  . Tobacco abuse   . Type II diabetes mellitus ([name] [name])     Past Surgical History:  Procedure Laterality Date  . ABDOMINAL HYSTERECTOMY    . BREAST BIOPSY Left   . CARDIAC CATHETERIZATION N/A 06/13/2015   Procedure: Left Heart Cath and Coronary Angiography;  Surgeon: [name] M Martinique, MD;  Location: [name];  Service: Cardiovascular;  Laterality: N/A;  . CARDIAC CATHETERIZATION N/A 10/04/2015   Procedure: Left Heart Cath and Coronary Angiography;  Surgeon: [name] Booze, MD;  Location: [name];  Service: Cardiovascular;  Laterality: N/A;  . CARDIAC CATHETERIZATION N/A 10/04/2015   Procedure: Coronary Stent Intervention;  Surgeon: [name] Booze, MD;  Location: [name];  Service: Cardiovascular;  Laterality: N/A;  . CORONARY ARTERY BYPASS GRAFT N/A 06/19/2015   Procedure: CORONARY  ARTERY BYPASS GRAFTING (CABG) TIMES FOUR USING LEFT INTERNAL MAMMARY, RIGHT SAPHENOUS LEG VEIN AND CRYO SAPHENOUS VEIN;  Surgeon: [name] Poot, MD;  Location: [facility];  Service: Open Heart Surgery;  Laterality: N/A;  . DILATION AND CURETTAGE OF UTERUS    . TEE WITHOUT CARDIOVERSION N/A 06/19/2015   Procedure: TRANSESOPHAGEAL ECHOCARDIOGRAM (TEE);  Surgeon: [name] Poot, MD;  Location: [facility];  Service: Open Heart Surgery;  Laterality: N/A;  . TONSILLECTOMY  1960s  . TUBAL LIGATION      MEDICATIONS: . acetaminophen (TYLENOL) 325 MG tablet  . aspirin EC 81 MG EC tablet  . atorvastatin (LIPITOR) 80 MG tablet  . Biotin w/ Vitamins C & E (HAIR/SKIN/NAILS PO)  . cetirizine (ZYRTEC) 10 MG tablet  . Cholecalciferol (VITAMIN D) 2000 units tablet  . famotidine (PEPCID) 20  MG tablet  . Iron-Vitamins (GERITOL COMPLETE) TABS  . isosorbide mononitrate (IMDUR) 30 MG 24 hr tablet  . liraglutide (VICTOZA) 18 MG/3ML SOPN  . metFORMIN (GLUCOPHAGE) 1000 MG tablet  . metoprolol tartrate (LOPRESSOR) 25 MG tablet  . nitroGLYCERIN (NITROSTAT) 0.4 MG SL tablet   No current facility-administered medications for this encounter.      Maia Plan [facility] Pre-Surgical Testing [phone] 01/04/19  12:54 PM

## 2019-01-04 NOTE — Anesthesia Preprocedure Evaluation (Addendum)
Anesthesia Evaluation  Patient identified by MRN, date of birth, ID band Patient awake    Reviewed: Allergy & Precautions, NPO status , Patient's Chart, lab work & pertinent test results  Airway Mallampati: II  TM Distance: >3 FB Neck ROM: Full    Dental  (+) Teeth Intact, Dental Advisory Given   Pulmonary former smoker,    breath sounds clear to auscultation       Cardiovascular hypertension,  Rhythm:Regular Rate:Normal     Neuro/Psych    GI/Hepatic   Endo/Other  diabetes  Renal/GU      Musculoskeletal   Abdominal   Peds  Hematology   Anesthesia Other Findings   Reproductive/Obstetrics                            Anesthesia Physical Anesthesia Plan  ASA: III  Anesthesia Plan: General   Post-op Pain Management:    Induction: Intravenous  PONV Risk Score and Plan: Dexamethasone and Ondansetron  Airway Management Planned: Oral ETT  Additional Equipment:   Intra-op Plan:   Post-operative Plan: Extubation in OR  Informed Consent: I have reviewed the patients History and Physical, chart, labs and discussed the procedure including the risks, benefits and alternatives for the proposed anesthesia with the patient or authorized representative who has indicated his/her understanding and acceptance.     Dental advisory given  Plan Discussed with: CRNA and Anesthesiologist  Anesthesia Plan Comments: (See PAT note 01/03/2019, Konrad Felix, [name])       Anesthesia Quick Evaluation

## 2019-01-05 NOTE — Progress Notes (Signed)
Anesthesia follow-up: See APP note by Chip Boer, [name]. Cardiology clearance for surgery outlined by Fabian Sharp, [name] on 12/13/2018.  Final EKG report from 01/03/2019: Normal sinus rhythm Possible Left atrial enlargement Incomplete right bundle branch block Inferior ischemia Lateral ischemia Abnormal ECG Compared to follow 18 2018, inferior Q waves less prominent. T wave inversion in the inferior and lateral leads new. Confirmed by Adrian Prows ([id number]) on 01/04/2019 9:42:14 PM  Although T wave inversion is new in inferior and lateral leads since 03/03/2017 tracing, her most recent comparison tracing done at [facility] on 11/12/2017 does show inferior and lateral lead inversion, so I do not feel that most recent tracing is significantly changed when compared to 11/12/2017 EKG.   Myra Gianotti, [name] Surgical Short Stay/Anesthesiology [facility] Phone [phone] [facility] Phone [phone] 01/05/2019 10:33 AM

## 2019-01-06 ENCOUNTER — Other Ambulatory Visit (HOSPITAL_COMMUNITY)
Admission: RE | Admit: 2019-01-06 | Discharge: 2019-01-06 | Disposition: A | Discharge status: Home / Self Care | Payer: Medicare Other | Source: Ambulatory Visit | Attending: Urology | Admitting: Urology

## 2019-01-06 DIAGNOSIS — Z20828 Contact with and (suspected) exposure to other viral communicable diseases: Secondary | ICD-10-CM | POA: Insufficient documentation

## 2019-01-06 DIAGNOSIS — Z01812 Encounter for preprocedural laboratory examination: Secondary | ICD-10-CM | POA: Insufficient documentation

## 2019-01-08 LAB — NOVEL CORONAVIRUS, NAA (HOSP ORDER, SEND-OUT TO REF LAB; TAT 18-24 HRS): SARS-CoV-2, NAA: NOT DETECTED

## 2019-01-09 NOTE — H&P (Signed)
CC: I have kidney stones.  HPI: [name] [name] is a 58 year-old female patient with left renal calculi.  The problem is on the left side. Her symptoms include flank pain, nausea, and blood in urine. Patient denies having back pain, groin pain, vomiting, fever, and chills. She first began experiencing symptoms 09/17/2018. This is not her first kidney stone. She has had 1 stones prior to getting this one. She is not currently having flank pain, back pain, groin pain, nausea, vomiting, fever or chills. She has not caught a stone in her urine strainer since her symptoms began.   She has never had surgical treatment for calculi in the past. This condition would be considered of mild to moderate severity with no modifying factors or associated signs or symptoms other than as noted above.   11/18/18:  A CT scan on 10/26/18 revealed an oval stone in the area of the renal pelvis on the left-hand side that measured 2.1 cm with Hounsfield units of 1400 with a 2nd stone that was larger measuring 2.5 cm and located in the lower pole with an additional stone in that kidney as well. There were no right renal calculi.  She reports she had an episode of gross hematuria associated with no pain but there was some nausea she described as mild back in 7/20. She had previous episode of gross hematuria in 2011 when she had associated severe left flank pain and believe she passed a stone at that time. She reports to me that she has a history of urinary tract infections but is not having any symptoms to suggest infection currently.     ALLERGIES: None   MEDICATIONS: Aspirin  Cipro  Metformin Hcl  Metoprolol Tartrate  Zyrtec  Acetaminophen 325 mg capsule  Atorvastatin Calcium 80 mg tablet  Biotin  Isosorbide Dinitrate 30 mg tablet  Nitroglycerin 0.3 mg tablet, sublingual  Victoza  Vitamin D2     GU PSH: Hysterectomy     NON-GU PSH: Bilateral Tubal Ligation Breast Biopsy CABG (coronary artery bypass  grafting) Tonsillectomy     GU PMH: Renal calculus (Acute), Left - 10/26/2018 Ureteral obstruction secondary to calculous (Acute), Left - 10/26/2018    NON-GU PMH: Arthritis Depression Diabetes Type 2 Encounter for general adult medical examination without abnormal findings, Encounter for preventive health examination GERD Heart disease, unspecified Hypercholesterolemia Hypertension Stroke/TIA    FAMILY HISTORY: None   SOCIAL HISTORY: Marital Status: Widowed Preferred Language: English; Ethnicity: Not Hispanic Or Latino; Race: Black or African American Current Smoking Status: Patient does not smoke anymore. Smoked 1/2 pack per day.   Tobacco Use Assessment Completed: Used Tobacco in last 30 days? Has never drank.  Does not drink caffeine.    REVIEW OF SYSTEMS:    GU Review Female:   Patient reports frequent urination, hard to postpone urination, get up at night to urinate, and leakage of urine. Patient denies burning /pain with urination, stream starts and stops, trouble starting your stream, have to strain to urinate, and being pregnant.  Gastrointestinal (Upper):   Patient denies nausea, vomiting, and indigestion/ heartburn.  Gastrointestinal (Lower):   Patient denies diarrhea and constipation.  Constitutional:   Patient denies fever, night sweats, weight loss, and fatigue.  Skin:   Patient denies skin rash/ lesion and itching.  Eyes:   Patient denies blurred vision and double vision.  Ears/ Nose/ Throat:   Patient denies sore throat and sinus problems.  Hematologic/Lymphatic:   Patient denies swollen glands and easy bruising.  Cardiovascular:  Patient denies leg swelling and chest pains.  Respiratory:   Patient denies cough and shortness of breath.  Endocrine:   Patient denies excessive thirst.  Musculoskeletal:   Patient denies joint pain and back pain.  Neurological:   Patient denies headaches and dizziness.  Psychologic:   Patient denies depression and anxiety.    VITAL SIGNS:    Weight 210 lb / 95.25 kg  Height 67 in / 170.18 cm  BP 145/1 mmHg  Pulse 90 /min  Temperature 98.0 F / 36.6 C  BMI 32.9 kg/m   MULTI-SYSTEM PHYSICAL EXAMINATION:    Constitutional: Well-nourished. No physical deformities. Normally developed. Good grooming.  Neck: Neck symmetrical, not swollen. Normal tracheal position.  Respiratory: No labored breathing, no use of accessory muscles.   Cardiovascular: Normal temperature, normal extremity pulses, no swelling, no varicosities.  Lymphatic: No enlargement of neck, axillae, groin.  Skin: No paleness, no jaundice, no cyanosis. No lesion, no ulcer, no rash.  Neurologic / Psychiatric: Oriented to time, oriented to place, oriented to person. No depression, no anxiety, no agitation.  Gastrointestinal: No mass, no tenderness, no rigidity, non obese abdomen.  Eyes: Normal conjunctivae. Normal eyelids.  Ears, Nose, Mouth, and Throat: Left ear no scars, no lesions, no masses. Right ear no scars, no lesions, no masses. Nose no scars, no lesions, no masses. Normal hearing. Normal lips.  Musculoskeletal: Normal gait and station of head and neck.     PAST DATA REVIEWED:  Source Of History:  Patient  Lab Test Review:   BUN/Creatinine, Calcium  X-Ray Review: C.T. Abdomen/Pelvis: Reviewed Films. Reviewed Report. Discussed With Patient.    Notes:                     A creatinine in 12/18 was 0.99 with a normal serum calcium at that time of 9.6   ASSESSMENT/PLAN:     ICD-10 Details  1 GU:   Renal calculus - N20.0 Left,  We discussed the management of urinary stones. These options include observation, ureteroscopy, shockwave lithotripsy, and PCNL. We discussed which options are relevant to these particular stones. We discussed the natural history of stones as well as the complications of untreated stones and the impact on quality of life without treatment as well as with each of the above listed treatments. We also discussed the efficacy  of each treatment in its ability to clear the stone burden. With any of these management options I discussed the signs and symptoms of infection and the need for emergent treatment should these be experienced. For each option we discussed the ability of each procedure to clear the patient of their stone burden.   For observation I described the risks which include but are not limited to silent renal damage, life-threatening infection, need for emergent surgery, failure to pass stone, and pain.   For ureteroscopy I described the risks which include heart attack, stroke, pulmonary embolus, death, bleeding, infection, damage to contiguous structures, positioning injury, ureteral stricture, ureteral avulsion, ureteral injury, need for ureteral stent, inability to perform ureteroscopy, need for an interval procedure, inability to clear stone burden, stent discomfort and pain.   For shockwave lithotripsy I described the risks which include arrhythmia, kidney contusion, kidney hemorrhage, need for transfusion, long-term risk of diabetes or hypertension, back discomfort, flank ecchymosis, flank abrasion, inability to break up stone, inability to pass stone fragments, Steinstrasse, infection associated with obstructing stones, need for different surgical procedure, need for repeat shockwave lithotripsy, and death.   For  PCNL I described the risks including heart attack, sure, pulmonary embolus, death, positioning injury, pneumothorax, hydrothorax, need for chest tube, inability to clear stone burden, renal laceration, arterial venous fistula or malformation, need for embolization of kidney, loss of kidney or renal function, need for repeat procedure, need for prolonged nephrostomy tube, ureteral avulsion, fistula.   She has several large renal calculi and therefore we have discussed proceeding with PCNL. She is in agreement.

## 2019-01-09 NOTE — Discharge Instructions (Signed)

## 2019-01-10 ENCOUNTER — Encounter (HOSPITAL_COMMUNITY)
Admission: RE | Disposition: A | Discharge status: Home / Self Care | Payer: Self-pay | Source: Home / Self Care | Attending: Urology

## 2019-01-10 ENCOUNTER — Ambulatory Visit (HOSPITAL_COMMUNITY)
Admission: RE | Admit: 2019-01-10 | Discharge: 2019-01-10 | Disposition: A | Discharge status: Home / Self Care | Payer: Medicare Other | Source: Ambulatory Visit | Attending: Urology | Admitting: Urology

## 2019-01-10 ENCOUNTER — Other Ambulatory Visit: Payer: Self-pay

## 2019-01-10 ENCOUNTER — Ambulatory Visit (HOSPITAL_COMMUNITY): Payer: Medicare Other | Admitting: Vascular Surgery

## 2019-01-10 ENCOUNTER — Encounter (HOSPITAL_COMMUNITY): Payer: Self-pay | Admitting: *Deleted

## 2019-01-10 ENCOUNTER — Observation Stay (HOSPITAL_COMMUNITY)
Admission: RE | Admit: 2019-01-10 | Discharge: 2019-01-11 | Disposition: A | Discharge status: Home / Self Care | Payer: Medicare Other | Attending: Urology | Admitting: Urology

## 2019-01-10 ENCOUNTER — Ambulatory Visit (HOSPITAL_COMMUNITY): Payer: Medicare Other

## 2019-01-10 ENCOUNTER — Ambulatory Visit (HOSPITAL_COMMUNITY): Payer: Medicare Other | Admitting: Physician Assistant

## 2019-01-10 ENCOUNTER — Other Ambulatory Visit: Payer: Self-pay | Admitting: Radiology

## 2019-01-10 ENCOUNTER — Observation Stay (HOSPITAL_COMMUNITY): Payer: Medicare Other

## 2019-01-10 DIAGNOSIS — I10 Essential (primary) hypertension: Secondary | ICD-10-CM | POA: Diagnosis not present

## 2019-01-10 DIAGNOSIS — Z87891 Personal history of nicotine dependence: Secondary | ICD-10-CM | POA: Insufficient documentation

## 2019-01-10 DIAGNOSIS — Z7982 Long term (current) use of aspirin: Secondary | ICD-10-CM | POA: Diagnosis not present

## 2019-01-10 DIAGNOSIS — E119 Type 2 diabetes mellitus without complications: Secondary | ICD-10-CM | POA: Insufficient documentation

## 2019-01-10 DIAGNOSIS — E7800 Pure hypercholesterolemia, unspecified: Secondary | ICD-10-CM | POA: Insufficient documentation

## 2019-01-10 DIAGNOSIS — N132 Hydronephrosis with renal and ureteral calculous obstruction: Principal | ICD-10-CM | POA: Insufficient documentation

## 2019-01-10 DIAGNOSIS — Z951 Presence of aortocoronary bypass graft: Secondary | ICD-10-CM | POA: Diagnosis not present

## 2019-01-10 DIAGNOSIS — Z8673 Personal history of transient ischemic attack (TIA), and cerebral infarction without residual deficits: Secondary | ICD-10-CM | POA: Insufficient documentation

## 2019-01-10 DIAGNOSIS — N200 Calculus of kidney: Secondary | ICD-10-CM

## 2019-01-10 DIAGNOSIS — I252 Old myocardial infarction: Secondary | ICD-10-CM | POA: Insufficient documentation

## 2019-01-10 DIAGNOSIS — Z79899 Other long term (current) drug therapy: Secondary | ICD-10-CM | POA: Diagnosis not present

## 2019-01-10 DIAGNOSIS — I2510 Atherosclerotic heart disease of native coronary artery without angina pectoris: Secondary | ICD-10-CM | POA: Diagnosis not present

## 2019-01-10 DIAGNOSIS — Z7984 Long term (current) use of oral hypoglycemic drugs: Secondary | ICD-10-CM | POA: Insufficient documentation

## 2019-01-10 HISTORY — PX: NEPHROLITHOTOMY: SHX5134

## 2019-01-10 HISTORY — PX: IR URETERAL STENT LEFT NEW ACCESS W/O SEP NEPHROSTOMY CATH: IMG6075

## 2019-01-10 LAB — BASIC METABOLIC PANEL
Anion gap: 9 (ref 5–15)
BUN: 21 mg/dL — ABNORMAL HIGH (ref 6–20)
CO2: 24 mmol/L (ref 22–32)
Calcium: 9.7 mg/dL (ref 8.9–10.3)
Chloride: 106 mmol/L (ref 98–111)
Creatinine, Ser: 1.08 mg/dL — ABNORMAL HIGH (ref 0.44–1.00)
GFR calc Af Amer: >60 mL/min (ref >60)
GFR calc non Af Amer: 56 mL/min — ABNORMAL LOW (ref >60)
Glucose, Bld: 119 mg/dL — ABNORMAL HIGH (ref 70–99)
Potassium: 4.1 mmol/L (ref 3.5–5.1)
Sodium: 139 mmol/L (ref 135–145)

## 2019-01-10 LAB — CBC WITH DIFFERENTIAL/PLATELET
Abs Immature Granulocytes: 0.01 10*3/uL (ref 0.00–0.07)
Basophils Absolute: 0.1 10*3/uL (ref 0.0–0.1)
Basophils Relative: 1 %
Eosinophils Absolute: 0.3 10*3/uL (ref 0.0–0.5)
Eosinophils Relative: 4 %
HCT: 46.3 % — ABNORMAL HIGH (ref 36.0–46.0)
Hemoglobin: 14.4 g/dL (ref 12.0–15.0)
Immature Granulocytes: 0 %
Lymphocytes Relative: 39 %
Lymphs Abs: 3.3 10*3/uL (ref 0.7–4.0)
MCH: 28.1 pg (ref 26.0–34.0)
MCHC: 31.1 g/dL (ref 30.0–36.0)
MCV: 90.4 fL (ref 80.0–100.0)
Monocytes Absolute: 0.6 10*3/uL (ref 0.1–1.0)
Monocytes Relative: 7 %
Neutro Abs: 4.2 10*3/uL (ref 1.7–7.7)
Neutrophils Relative %: 49 %
Platelets: 340 10*3/uL (ref 150–400)
RBC: 5.12 MIL/uL — ABNORMAL HIGH (ref 3.87–5.11)
RDW: 15.8 % — ABNORMAL HIGH (ref 11.5–15.5)
WBC: 8.5 10*3/uL (ref 4.0–10.5)
nRBC: 0 % (ref 0.0–0.2)

## 2019-01-10 LAB — GLUCOSE, CAPILLARY
Glucose-Capillary: 126 mg/dL — ABNORMAL HIGH (ref 70–99)
Glucose-Capillary: 125 mg/dL — ABNORMAL HIGH (ref 70–99)

## 2019-01-10 LAB — PROTIME-INR
INR: 0.9 (ref 0.8–1.2)
Prothrombin Time: 12.3 s (ref 11.4–15.2)

## 2019-01-10 SURGERY — NEPHROLITHOTOMY PERCUTANEOUS
Anesthesia: General | Laterality: Left

## 2019-01-10 MED ORDER — PROPOFOL 10 MG/ML IV BOLUS
INTRAVENOUS | Status: AC
  Filled 2019-01-10: qty 20

## 2019-01-10 MED ORDER — OXYCODONE HCL 5 MG/5ML PO SOLN: 5.0000 mg | Freq: Once | ORAL | Status: AC | PRN

## 2019-01-10 MED ORDER — ONDANSETRON HCL 4 MG/2ML IJ SOLN: 4.0000 mg | INTRAMUSCULAR | Status: DC | PRN

## 2019-01-10 MED ORDER — FENTANYL CITRATE (PF) 100 MCG/2ML IJ SOLN
INTRAMUSCULAR | Status: AC
  Filled 2019-01-10: qty 4

## 2019-01-10 MED ORDER — MIDAZOLAM HCL 2 MG/2ML IJ SOLN
INTRAMUSCULAR | Status: AC | PRN
  Administered 2019-01-10 (×2): 1 mg via INTRAVENOUS

## 2019-01-10 MED ORDER — LABETALOL HCL 5 MG/ML IV SOLN
10.0000 mg | Freq: Once | INTRAVENOUS | Status: AC
  Administered 2019-01-10: 15:00:00 10 mg via INTRAVENOUS

## 2019-01-10 MED ORDER — PROPOFOL 10 MG/ML IV BOLUS
INTRAVENOUS | Status: DC | PRN
  Administered 2019-01-10: 150 mg via INTRAVENOUS

## 2019-01-10 MED ORDER — LIDOCAINE 2% (20 MG/ML) 5 ML SYRINGE
INTRAMUSCULAR | Status: DC | PRN
  Administered 2019-01-10: 60 mg via INTRAVENOUS

## 2019-01-10 MED ORDER — CEFAZOLIN SODIUM-DEXTROSE 1-4 GM/50ML-% IV SOLN
1.0000 g | Freq: Three times a day (TID) | INTRAVENOUS | Status: AC
  Administered 2019-01-10 – 2019-01-11 (×2): 1 g via INTRAVENOUS
  Filled 2019-01-10 (×2): qty 50

## 2019-01-10 MED ORDER — OXYCODONE HCL 5 MG PO TABS
ORAL_TABLET | ORAL | Status: AC
  Filled 2019-01-10: qty 1

## 2019-01-10 MED ORDER — BACITRACIN-NEOMYCIN-POLYMYXIN 400-5-5000 EX OINT: 1.0000 "application " | TOPICAL_OINTMENT | Freq: Three times a day (TID) | CUTANEOUS | Status: DC | PRN

## 2019-01-10 MED ORDER — HYDROMORPHONE HCL 1 MG/ML IJ SOLN
0.5000 mg | INTRAMUSCULAR | Status: DC | PRN
  Administered 2019-01-10: 1 mg via INTRAVENOUS
  Filled 2019-01-10: qty 1

## 2019-01-10 MED ORDER — MIDAZOLAM HCL 2 MG/2ML IJ SOLN
INTRAMUSCULAR | Status: AC
  Filled 2019-01-10: qty 2

## 2019-01-10 MED ORDER — DEXAMETHASONE SODIUM PHOSPHATE 10 MG/ML IJ SOLN
INTRAMUSCULAR | Status: DC | PRN
  Administered 2019-01-10: 5 mg via INTRAVENOUS

## 2019-01-10 MED ORDER — OXYCODONE HCL 5 MG PO TABS
5.0000 mg | ORAL_TABLET | Freq: Once | ORAL | Status: AC | PRN
  Administered 2019-01-10: 5 mg via ORAL

## 2019-01-10 MED ORDER — OXYBUTYNIN CHLORIDE 5 MG PO TABS: 5.0000 mg | ORAL_TABLET | Freq: Three times a day (TID) | ORAL | Status: DC | PRN

## 2019-01-10 MED ORDER — SODIUM CHLORIDE 0.9 % IR SOLN
Status: DC | PRN
  Administered 2019-01-10: 21000 mL

## 2019-01-10 MED ORDER — ACETAMINOPHEN 500 MG PO TABS
1000.0000 mg | ORAL_TABLET | Freq: Four times a day (QID) | ORAL | Status: DC
  Administered 2019-01-10 – 2019-01-11 (×3): 1000 mg via ORAL
  Filled 2019-01-10 (×3): qty 2

## 2019-01-10 MED ORDER — ROCURONIUM BROMIDE 10 MG/ML (PF) SYRINGE
PREFILLED_SYRINGE | INTRAVENOUS | Status: DC | PRN
  Administered 2019-01-10 (×2): 10 mg via INTRAVENOUS

## 2019-01-10 MED ORDER — SODIUM CHLORIDE 0.9 % IV SOLN
INTRAVENOUS | Status: DC
  Administered 2019-01-10 – 2019-01-11 (×3): via INTRAVENOUS

## 2019-01-10 MED ORDER — IOHEXOL 300 MG/ML  SOLN
INTRAMUSCULAR | Status: DC | PRN
  Administered 2019-01-10: 10 mL

## 2019-01-10 MED ORDER — IOHEXOL 300 MG/ML  SOLN
50.0000 mL | Freq: Once | INTRAMUSCULAR | Status: AC | PRN
  Administered 2019-01-10: 15 mL

## 2019-01-10 MED ORDER — ONDANSETRON HCL 4 MG/2ML IJ SOLN
INTRAMUSCULAR | Status: AC
  Filled 2019-01-10: qty 2

## 2019-01-10 MED ORDER — FENTANYL CITRATE (PF) 100 MCG/2ML IJ SOLN
INTRAMUSCULAR | Status: AC
  Filled 2019-01-10: qty 2

## 2019-01-10 MED ORDER — OXYCODONE-ACETAMINOPHEN 5-325 MG PO TABS: 1.0000 | ORAL_TABLET | ORAL | Status: DC | PRN

## 2019-01-10 MED ORDER — FENTANYL CITRATE (PF) 100 MCG/2ML IJ SOLN
25.0000 ug | INTRAMUSCULAR | Status: DC | PRN
  Administered 2019-01-10: 25 ug via INTRAVENOUS

## 2019-01-10 MED ORDER — LIDOCAINE HCL (PF) 1 % IJ SOLN
INTRAMUSCULAR | Status: AC | PRN
  Administered 2019-01-10: 10 mL

## 2019-01-10 MED ORDER — LABETALOL HCL 5 MG/ML IV SOLN
INTRAVENOUS | Status: AC
  Administered 2019-01-10: 10 mg via INTRAVENOUS
  Filled 2019-01-10: qty 4

## 2019-01-10 MED ORDER — ONDANSETRON HCL 4 MG/2ML IJ SOLN: 4.0000 mg | Freq: Once | INTRAMUSCULAR | Status: DC | PRN

## 2019-01-10 MED ORDER — MIDAZOLAM HCL 5 MG/5ML IJ SOLN
INTRAMUSCULAR | Status: DC | PRN
  Administered 2019-01-10: 1 mg via INTRAVENOUS

## 2019-01-10 MED ORDER — 0.9 % SODIUM CHLORIDE (POUR BTL) OPTIME
TOPICAL | Status: DC | PRN
  Administered 2019-01-10: 1000 mL

## 2019-01-10 MED ORDER — CEFAZOLIN SODIUM-DEXTROSE 2-4 GM/100ML-% IV SOLN
2.0000 g | INTRAVENOUS | Status: DC
  Filled 2019-01-10: qty 100

## 2019-01-10 MED ORDER — FENTANYL CITRATE (PF) 250 MCG/5ML IJ SOLN
INTRAMUSCULAR | Status: AC
  Filled 2019-01-10: qty 5

## 2019-01-10 MED ORDER — FENTANYL CITRATE (PF) 100 MCG/2ML IJ SOLN
INTRAMUSCULAR | Status: AC | PRN
  Administered 2019-01-10 (×2): 50 ug via INTRAVENOUS

## 2019-01-10 MED ORDER — MIDAZOLAM HCL 2 MG/2ML IJ SOLN
INTRAMUSCULAR | Status: AC
  Filled 2019-01-10: qty 6

## 2019-01-10 MED ORDER — CEFAZOLIN SODIUM-DEXTROSE 2-4 GM/100ML-% IV SOLN
2.0000 g | Freq: Once | INTRAVENOUS | Status: AC
  Administered 2019-01-10: 2 g via INTRAVENOUS

## 2019-01-10 MED ORDER — LACTATED RINGERS IV SOLN
INTRAVENOUS | Status: DC
  Administered 2019-01-10 (×2): via INTRAVENOUS

## 2019-01-10 MED ORDER — SUGAMMADEX SODIUM 200 MG/2ML IV SOLN
INTRAVENOUS | Status: DC | PRN
  Administered 2019-01-10: 200 mg via INTRAVENOUS

## 2019-01-10 MED ORDER — LIDOCAINE HCL 1 % IJ SOLN
INTRAMUSCULAR | Status: AC
  Filled 2019-01-10: qty 20

## 2019-01-10 MED ORDER — FENTANYL CITRATE (PF) 100 MCG/2ML IJ SOLN
INTRAMUSCULAR | Status: DC | PRN
  Administered 2019-01-10 (×6): 50 ug via INTRAVENOUS

## 2019-01-10 MED ORDER — SUCCINYLCHOLINE CHLORIDE 20 MG/ML IJ SOLN
INTRAMUSCULAR | Status: DC | PRN
  Administered 2019-01-10: 120 mg via INTRAVENOUS

## 2019-01-10 MED ORDER — ONDANSETRON HCL 4 MG/2ML IJ SOLN
INTRAMUSCULAR | Status: DC | PRN
  Administered 2019-01-10: 4 mg via INTRAVENOUS

## 2019-01-10 SURGICAL SUPPLY — 58 items
AGENT HMST KT MTR STRL THRMB (HEMOSTASIS) ×1
APL ESCP 34 STRL LF DISP (HEMOSTASIS) ×1
APL PRP STRL LF DISP 70% ISPRP (MISCELLANEOUS)
APL SKNCLS STERI-STRIP NONHPOA (GAUZE/BANDAGES/DRESSINGS) ×1
APPLICATOR SURGIFLO ENDO (HEMOSTASIS) ×1 IMPLANT
BAG URINE DRAINAGE (UROLOGICAL SUPPLIES) IMPLANT
BASKET ZERO TIP NITINOL 2.4FR (BASKET) ×1 IMPLANT
BENZOIN TINCTURE PRP APPL 2/3 (GAUZE/BANDAGES/DRESSINGS) ×2 IMPLANT
BLADE SURG 15 STRL LF DISP TIS (BLADE) ×1 IMPLANT
BLADE SURG 15 STRL SS (BLADE) ×2
BSKT STON RTRVL ZERO TP 2.4FR (BASKET) ×1
CATH FOLEY 2W COUNCIL 20FR 5CC (CATHETERS) IMPLANT
CATH FOLEY 2WAY SLVR  5CC 18FR (CATHETERS) ×1
CATH FOLEY 2WAY SLVR 5CC 18FR (CATHETERS) IMPLANT
CATH IMAGER II 65CM (CATHETERS) IMPLANT
CATH X-FORCE N30 NEPHROSTOMY (TUBING) ×2 IMPLANT
CHLORAPREP W/TINT 26 (MISCELLANEOUS) ×1 IMPLANT
COVER SURGICAL LIGHT HANDLE (MISCELLANEOUS) ×2 IMPLANT
COVER WAND RF STERILE (DRAPES) IMPLANT
DRAPE C-ARM 42X120 X-RAY (DRAPES) ×2 IMPLANT
DRAPE LINGEMAN PERC (DRAPES) ×2 IMPLANT
DRAPE SURG IRRIG POUCH 19X23 (DRAPES) ×2 IMPLANT
DRSG PAD ABDOMINAL 8X10 ST (GAUZE/BANDAGES/DRESSINGS) ×4 IMPLANT
DRSG TEGADERM 4X4.75 (GAUZE/BANDAGES/DRESSINGS) ×1 IMPLANT
DRSG TEGADERM 8X12 (GAUZE/BANDAGES/DRESSINGS) IMPLANT
FIBER LASER TRAC TIP (UROLOGICAL SUPPLIES) IMPLANT
GAUZE SPONGE 2X2 8PLY STRL LF (GAUZE/BANDAGES/DRESSINGS) IMPLANT
GAUZE SPONGE 4X4 12PLY STRL (GAUZE/BANDAGES/DRESSINGS) ×2 IMPLANT
GLOVE BIOGEL M 8.0 STRL (GLOVE) ×2 IMPLANT
GOWN STRL REUS W/TWL XL LVL3 (GOWN DISPOSABLE) ×2 IMPLANT
GUIDEWIRE AMPLAZ .035X145 (WIRE) ×4 IMPLANT
GUIDEWIRE STR DUAL SENSOR (WIRE) ×1 IMPLANT
HOLDER FOLEY CATH W/STRAP (MISCELLANEOUS) ×2 IMPLANT
KIT BASIN OR (CUSTOM PROCEDURE TRAY) ×2 IMPLANT
KIT PROBE 340X3.4XDISP GRN (MISCELLANEOUS) IMPLANT
KIT PROBE TRILOGY 3.4X340 (MISCELLANEOUS)
KIT PROBE TRILOGY 3.9X350 (MISCELLANEOUS) ×1 IMPLANT
KIT TURNOVER KIT A (KITS) IMPLANT
MANIFOLD NEPTUNE II (INSTRUMENTS) ×2 IMPLANT
NS IRRIG 1000ML POUR BTL (IV SOLUTION) ×2 IMPLANT
PACK CYSTO (CUSTOM PROCEDURE TRAY) ×2 IMPLANT
SHEATH PEELAWAY SET 9 (SHEATH) ×2 IMPLANT
SPONGE GAUZE 2X2 STER 10/PKG (GAUZE/BANDAGES/DRESSINGS) ×1
SPONGE LAP 4X18 RFD (DISPOSABLE) ×2 IMPLANT
STENT URET 6FRX24 CONTOUR (STENTS) ×1 IMPLANT
STOPCOCK 4 WAY LG BORE MALE ST (IV SETS) ×1 IMPLANT
SURGIFLO W/THROMBIN 8M KIT (HEMOSTASIS) ×1 IMPLANT
SUT MNCRL AB 4-0 PS2 18 (SUTURE) ×1 IMPLANT
SUT SILK 2 0 30  PSL (SUTURE)
SUT SILK 2 0 30 PSL (SUTURE) IMPLANT
SYR 10ML LL (SYRINGE) ×2 IMPLANT
SYR 20ML LL LF (SYRINGE) ×4 IMPLANT
TOWEL OR 17X26 10 PK STRL BLUE (TOWEL DISPOSABLE) ×2 IMPLANT
TOWEL OR NON WOVEN STRL DISP B (DISPOSABLE) ×2 IMPLANT
TRAY FOLEY MTR SLVR 16FR STAT (SET/KITS/TRAYS/PACK) ×2 IMPLANT
TUBING CONNECTING 10 (TUBING) ×4 IMPLANT
TUBING STONE CATCHER TRILOGY (MISCELLANEOUS) IMPLANT
TUBING UROLOGY SET (TUBING) ×2 IMPLANT

## 2019-01-10 NOTE — Transfer of Care (Signed)
Immediate Anesthesia Transfer of Care Note  Patient: MODEST DUBICKI  Procedure(s) Performed: NEPHROLITHOTOMY PERCUTANEOUS (Left )  Patient Location: PACU  Anesthesia Type:General  Level of Consciousness: awake, alert  and oriented  Airway & Oxygen Therapy: Patient Spontanous Breathing and Patient connected to face mask oxygen  Post-op Assessment: Report given to RN and Post -op Vital signs reviewed and stable  Post vital signs: Reviewed and stable  Last Vitals:  Vitals Value Taken Time  BP 161/99 01/10/19 1400  Temp    Pulse 72 01/10/19 1402  Resp 13 01/10/19 1402  SpO2 96 % 01/10/19 1402  Vitals shown include unvalidated device data.  Last Pain:  Vitals:   01/10/19 1145  TempSrc: Oral         Complications: No apparent anesthesia complications

## 2019-01-10 NOTE — Progress Notes (Signed)
Received from PACU 58 yo female. A/o x 4 conversing w/ staff. Ambulated to bed from hallway. tol well. NAD noted foley in place .
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
## 2019-01-10 NOTE — Anesthesia Procedure Notes (Signed)
Procedure Name: Intubation Performed by: Gean Maidens, CRNA Pre-anesthesia Checklist: Patient identified, Emergency Drugs available, Suction available, Patient being monitored and Timeout performed Patient Re-evaluated:Patient Re-evaluated prior to induction Oxygen Delivery Method: Circle system utilized Preoxygenation: Pre-oxygenation with 100% oxygen Induction Type: IV induction Ventilation: Mask ventilation without difficulty Laryngoscope Size: Mac and 4 Grade View: Grade II Tube type: Oral Tube size: 7.0 mm Number of attempts: 1 Airway Equipment and Method: Stylet Placement Confirmation: ETT inserted through vocal cords under direct vision,  positive ETCO2,  CO2 detector and breath sounds checked- equal and bilateral Secured at: 21 cm Tube secured with: Tape Dental Injury: Teeth and Oropharynx as per pre-operative assessment  Comments: Intubation performed by C. [place], [place]

## 2019-01-10 NOTE — Procedures (Signed)
Interventional Radiology Procedure Note  Procedure: Image guided left percutaneous nephro-ureteral catheter. Findings: Access is into lower-posterior calyx containing multiple mobile stones.  Access is a 20F catheter to the urinary bladder, which will accept any 035 wire.  The 20F catheter is through a 20F short sheath access (from a Accustick set).   Complications: None  Recommendations:  - To the OR now - continue NPO  Signed,  Dulcy Fanny. Earleen Newport, DO

## 2019-01-10 NOTE — Progress Notes (Signed)
To CT

## 2019-01-10 NOTE — Anesthesia Postprocedure Evaluation (Signed)
Anesthesia Post Note  Patient: [name] [name]  Procedure(s) Performed: NEPHROLITHOTOMY PERCUTANEOUS (Left )     Patient location during evaluation: PACU Anesthesia Type: General Level of consciousness: sedated and awake Pain management: pain level controlled Vital Signs Assessment: post-procedure vital signs reviewed and stable Respiratory status: spontaneous breathing, nonlabored ventilation, respiratory function stable and patient connected to nasal cannula oxygen Cardiovascular status: blood pressure returned to baseline and stable Postop Assessment: no apparent nausea or vomiting Anesthetic complications: no    Last Vitals:  Vitals:   01/10/19 1357 01/10/19 1400  BP: (!) 161/102 (!) 161/99  Pulse: 79 73  Resp: 14 12  Temp:  (!) 36.4 C  SpO2: 97% 96%    Last Pain:  Vitals:   01/10/19 1400  TempSrc:   PainSc: Asleep                 Kennisha Qin COKER

## 2019-01-11 ENCOUNTER — Encounter (HOSPITAL_COMMUNITY): Payer: Self-pay | Admitting: Urology

## 2019-01-11 DIAGNOSIS — N132 Hydronephrosis with renal and ureteral calculous obstruction: Secondary | ICD-10-CM | POA: Diagnosis not present

## 2019-01-11 MED ORDER — OXYCODONE HCL 10 MG PO TABS: 10.0000 mg | ORAL_TABLET | ORAL | 0 refills | Status: DC | PRN

## 2019-01-11 MED ORDER — CHLORHEXIDINE GLUCONATE CLOTH 2 % EX PADS: 6.0000 | MEDICATED_PAD | Freq: Every day | CUTANEOUS | Status: DC

## 2019-01-11 MED ORDER — PHENAZOPYRIDINE HCL 200 MG PO TABS: 200.0000 mg | ORAL_TABLET | Freq: Three times a day (TID) | ORAL | 0 refills | Status: DC | PRN

## 2019-01-11 MED ORDER — LIP MEDEX EX OINT
TOPICAL_OINTMENT | CUTANEOUS | Status: DC | PRN
  Administered 2019-01-11: 01:00:00 via TOPICAL
  Filled 2019-01-11: qty 7

## 2019-01-11 NOTE — Discharge Summary (Signed)
Physician Discharge Summary      Patient ID: [name] [name] MRN: [id number] DOB/AGE: 60/20/1962 60 y.o.  Admit date: 01/10/2019 Discharge date: 01/11/2019  Admission Diagnoses: LEFT RENAL CALCULI  Discharge Diagnoses:  Active Problems:   Renal calculus, left   Discharged Condition: good  Hospital Course: She was admitted electively for a left PCNL due to large left renal calculi occupying the lower pole and renal pelvic region.  She underwent successful PCNL on 01/10/2019 with minimal blood loss and therefore the procedure was performed "tubeless" with the use of a ureteral stent only.  She did well overnight.  No significant pain.  No nausea or vomiting and tolerating a regular diet.  A postoperative CT revealed a few stones present within the kidney.  I did note some small round stones at the time of the procedure and it appears there are a few of those left in the kidney.  They have a very good probability of spontaneous passage due to their round, smooth nature and small size.  She is felt to be ready for discharge at this time and her Foley catheter will be removed and she will be discharged home with follow-up instructions given.  Discharge Exam: Blood pressure (!) 163/86, pulse 93, temperature 97.8 F (36.6 C), temperature source Oral, resp. rate 14, height 5\' 6"  (1.676 m), weight 103 kg, SpO2 95 %. General: Awake, alert and in no apparent distress. Chest: Normal respiratory effort. Cardiovascular: Regular rate and rhythm. Abdomen: Soft, nontender, nondistended. Back: Her nephrostomy site has a small, dry dressing in place with no evidence of ecchymoses or discharge.   Disposition: Discharge disposition: 01-Home or Self Care        Allergies as of 01/11/2019   No Known Allergies     Medication List    TAKE these medications   acetaminophen 325 MG tablet Commonly known as: TYLENOL Take 2 tablets (650 mg total) by mouth every 4 (four) hours as needed for  headache or mild pain.   aspirin 81 MG EC tablet Take 1 tablet (81 mg total) by mouth daily.   atorvastatin 80 MG tablet Commonly known as: LIPITOR TAKE 1 TABLET EVERY DAY  AT  6PM What changed:   how much to take  how to take this  when to take this  additional instructions   cetirizine 10 MG tablet Commonly known as: ZYRTEC Take 10 mg by mouth daily.   famotidine 20 MG tablet Commonly known as: PEPCID Take 20 mg by mouth at bedtime as needed for heartburn or indigestion.   Geritol Complete Tabs Take 1 tablet by mouth daily.   HAIR/SKIN/NAILS PO Take 5,000 mg by mouth daily at 12 noon.   isosorbide mononitrate 30 MG 24 hr tablet Commonly known as: IMDUR Take 0.5 tablets (15 mg total) by mouth daily.   liraglutide 18 MG/3ML Sopn Commonly known as: VICTOZA Inject 1.2 mg into the skin at bedtime.   metFORMIN 1000 MG tablet Commonly known as: GLUCOPHAGE Take 1,000 mg by mouth 2 (two) times daily.   metoprolol tartrate 25 MG tablet Commonly known as: LOPRESSOR Take one and a half (1.5) tablets (37.5 mg) by mouth twice (2) daily. What changed:   how much to take  how to take this  when to take this  additional instructions   nitroGLYCERIN 0.4 MG SL tablet Commonly known as: NITROSTAT DISSOLVE 1 TABLET UNDER THE TONGUE EVERY 5 MINUTES AS  NEEDED FOR CHEST PAIN. MAX  OF 3 TABLETS IN 15 MINUTES.  CALL 911 IF PAIN PERSISTS. What changed: See the new instructions.   Oxycodone HCl 10 MG Tabs Take 1 tablet (10 mg total) by mouth every 4 (four) hours as needed.   phenazopyridine 200 MG tablet Commonly known as: Pyridium Take 1 tablet (200 mg total) by mouth 3 (three) times daily as needed for pain.   Vitamin D 50 MCG (2000 UT) tablet Take 2,000 Units by mouth daily.      Follow-up Information    [facility] On 01/17/2019.   Why: For your appiontment at 10:45 Contact information: [address] [place]          Signed: Claybon Jabs 01/11/2019, 7:20 AM

## 2019-01-27 ENCOUNTER — Encounter: Payer: Self-pay | Admitting: Cardiology

## 2019-02-15 ENCOUNTER — Telehealth: Payer: Self-pay | Admitting: Cardiology

## 2019-02-15 NOTE — Telephone Encounter (Signed)
New message   Patient states that she is f/u on transportation forms that need to be filled out. Please call to discuss.

## 2019-02-21 NOTE — Telephone Encounter (Signed)
Returned call to patient left message on personal voice mail I never received any transportation forms.Advised to call me back.

## 2019-02-22 NOTE — Telephone Encounter (Signed)
Spoke to patient never received paper work she needs Dr.Jordan to sign.She will bring to office next week.

## 2019-02-22 NOTE — Telephone Encounter (Signed)
Spoke to patient she will refax paper work for Dr.Jordan to sign.

## 2019-03-03 NOTE — Telephone Encounter (Signed)
Received [facility] paperwork.Dr.Jordan signed.Forms faxed to [facility] eligibility dept at fax # [phone].Copy of forms mailed to patient.

## 2019-03-21 DIAGNOSIS — E1165 Type 2 diabetes mellitus with hyperglycemia: Secondary | ICD-10-CM | POA: Diagnosis not present

## 2019-03-21 DIAGNOSIS — I5033 Acute on chronic diastolic (congestive) heart failure: Secondary | ICD-10-CM | POA: Diagnosis not present

## 2019-03-21 DIAGNOSIS — I2510 Atherosclerotic heart disease of native coronary artery without angina pectoris: Secondary | ICD-10-CM | POA: Diagnosis not present

## 2019-03-21 DIAGNOSIS — I10 Essential (primary) hypertension: Secondary | ICD-10-CM | POA: Diagnosis not present

## 2019-03-21 DIAGNOSIS — M059 Rheumatoid arthritis with rheumatoid factor, unspecified: Secondary | ICD-10-CM | POA: Diagnosis not present

## 2019-03-21 DIAGNOSIS — E785 Hyperlipidemia, unspecified: Secondary | ICD-10-CM | POA: Diagnosis not present

## 2019-03-21 DIAGNOSIS — I2102 ST elevation (STEMI) myocardial infarction involving left anterior descending coronary artery: Secondary | ICD-10-CM | POA: Diagnosis not present

## 2019-03-21 DIAGNOSIS — F329 Major depressive disorder, single episode, unspecified: Secondary | ICD-10-CM | POA: Diagnosis not present

## 2019-03-21 DIAGNOSIS — E1121 Type 2 diabetes mellitus with diabetic nephropathy: Secondary | ICD-10-CM | POA: Diagnosis not present

## 2019-03-22 DIAGNOSIS — Z20828 Contact with and (suspected) exposure to other viral communicable diseases: Secondary | ICD-10-CM | POA: Diagnosis not present

## 2019-03-29 DIAGNOSIS — N200 Calculus of kidney: Secondary | ICD-10-CM | POA: Diagnosis not present

## 2019-03-29 NOTE — Progress Notes (Signed)
Cardiology Office Note    Date:  04/04/2019   ID:  [name] [name], DOB [birth year], MRN [id number]  PCP:  Leeroy Cha, MD  Cardiologist:  Dr. Martinique  Chief Complaint: follow up CAD  History of Present Illness:   [name] [name] is a 59 y.o. female with past medical history of CAD (s/p CABG on 06/19/2015 w/ LIMA-LAD, SVG-RCA, SVG-OM2), Lupus, Type 2 DM, HTN, and HLD who is seen for follow up CAD.   In April 2017 hospitalized with NSTEMI and cath with severe 3-vessel disease with 80% RCA stenosis, 85% Prox LAD stenosis, and 90% 1st Mrg with 85% 2nd Mrg. CABG was recommended and this was performed on 4/4 with the LIMA-LAD, SVG-RCA, and SVG-OM2. She was started on ASA and Plavix.  She presented 10/04/15 to [facility] with complained of chest discomfort for the past 3 weeks. Upon arrival to the ED, she was noted to have ST elevation in the inferior and lateral leads of her EKG. Emergently to cardiac catheterization revealed Patent LIMA to the LAD, occluded SVG to OM, occluded SVG to RCA.  Distal RCA 99% stenosed and she underwent emergent PCI with Promus DES to distal RCA. Her troponin peaked at 14. Plavix changed to [place]. Pt had persistent ST elevation inferiorly and tachycardia. Increased BB.Echo with EF 55-60% inferior hypokinesis and relatively hyperdynamic anterior wall motion.  Zocor changed to lipitor.   She was seen in December 2018 with atypical lateral chest pain, shoulder pain- worse with deep breathing. Troponin levels were normal. She had colonoscopy with polypectomy several days prior. There was mild edema noted on CXR and she was given IV lasix x1. Echo showed EF 45-50% that on my review was not significantly changed from prior. She was observed overnight and DC the next day.   She has been diagnosed with psoriatic arthritis. Followed by Rheumatology  This past fall she had recurrent UTIs and kidney stones. Had surgery but stones have recurred.   On follow up today she  denies any chest pain or SOB. She works for [employer]. She is eating healthier and reports good glycemic control at home. Doesn't take BP every day but usually OK. She is not smoking. Is in a difficult living situation and being evicted from apartment.   Past Medical History:  Diagnosis Date  . Acute ST elevation myocardial infarction (STEMI) involving right coronary artery ([place]) 10/04/2015  . Anxiety   . Anxiety and depression   . CAD (coronary artery disease)    a. CABG 06/2015: LIMA-LAD, SVG-RCA, SVG-OM2 b. STEMI s/p PCI to distal RCA lesion with a small Promus Premier stent. ( patent LIMA to the LAD, occluded SVG to OM, occluded SVG to RCA.)  . CAD (coronary artery disease) of artery bypass graft; occluded SVG to RCA and occluded SVG to OM 10/08/2015  . Empty sella ([place])   . Hidradenitis axillaris   . Hyperlipemia   . Hypertension   . Kidney stones   . Lupus ([place]) dx'd 2008   "they think I have this; have to get retested" (06/12/2015)  . Obesity (BMI 30.0-34.9)   . Ovarian cyst   . Pneumonia X 2  . PONV (postoperative nausea and vomiting)   . Rheumatoid aortitis   . Rheumatoid arthritis ([place])   . S/P angioplasty with stent; 10/04/15 to distal RCA lesion. with Promus premier. 10/08/2015  . Scoliosis   . Stroke [facility]) March 2014   left MCA branches; "they say I have them all the time",  denies residual on 06/12/2015  . Tobacco abuse   . Type II diabetes mellitus ([place])     Past Surgical History:  Procedure Laterality Date  . ABDOMINAL HYSTERECTOMY    . BREAST BIOPSY Left   . CARDIAC CATHETERIZATION N/A 06/13/2015   Procedure: Left Heart Cath and Coronary Angiography;  Surgeon: Yaa Donnellan M Martinique, MD;  Location: [place] [facility];  Service: Cardiovascular;  Laterality: N/A;  . CARDIAC CATHETERIZATION N/A 10/04/2015   Procedure: Left Heart Cath and Coronary Angiography;  Surgeon: [name], MD;  Location: [place] [facility];  Service: Cardiovascular;  Laterality: N/A;  .  CARDIAC CATHETERIZATION N/A 10/04/2015   Procedure: Coronary Stent Intervention;  Surgeon: [name], MD;  Location: [place] [facility];  Service: Cardiovascular;  Laterality: N/A;  . CORONARY ARTERY BYPASS GRAFT N/A 06/19/2015   Procedure: CORONARY ARTERY BYPASS GRAFTING (CABG) TIMES FOUR USING LEFT INTERNAL MAMMARY, RIGHT SAPHENOUS LEG VEIN AND CRYO SAPHENOUS VEIN;  Surgeon: [name], MD;  Location: [facility];  Service: Open Heart Surgery;  Laterality: N/A;  . DILATION AND CURETTAGE OF UTERUS    . IR URETERAL STENT LEFT NEW ACCESS W/O SEP NEPHROSTOMY CATH  01/10/2019  . NEPHROLITHOTOMY Left 01/10/2019   Procedure: NEPHROLITHOTOMY PERCUTANEOUS;  Surgeon: Kathie Rhodes, MD;  Location: [facility];  Service: Urology;  Laterality: Left;  . TEE WITHOUT CARDIOVERSION N/A 06/19/2015   Procedure: TRANSESOPHAGEAL ECHOCARDIOGRAM (TEE);  Surgeon: [name], MD;  Location: [facility];  Service: Open Heart Surgery;  Laterality: N/A;  . TONSILLECTOMY  1960s  . TUBAL LIGATION      Current Medications: Allergies as of 04/04/2019   No Known Allergies     Medication List       Accurate as of April 04, 2019  2:31 PM. If you have any questions, ask your nurse or doctor.        acetaminophen 325 MG tablet Commonly known as: TYLENOL Take 2 tablets (650 mg total) by mouth every 4 (four) hours as needed for headache or mild pain.   aspirin 81 MG EC tablet Take 1 tablet (81 mg total) by mouth daily.   atorvastatin 80 MG tablet Commonly known as: LIPITOR TAKE 1 TABLET EVERY DAY  AT  6PM What changed:   how much to take  how to take this  when to take this  additional instructions   cetirizine 10 MG tablet Commonly known as: ZYRTEC Take 10 mg by mouth daily.   famotidine 20 MG tablet Commonly known as: PEPCID Take 20 mg by mouth at bedtime as needed for heartburn or indigestion.   Geritol Complete Tabs Take 1 tablet by mouth daily.   HAIR/SKIN/NAILS PO Take 5,000 mg by mouth  daily at 12 noon.   isosorbide mononitrate 30 MG 24 hr tablet Commonly known as: IMDUR Take 0.5 tablets (15 mg total) by mouth daily.   liraglutide 18 MG/3ML Sopn Commonly known as: VICTOZA Inject 1.2 mg into the skin at bedtime.   metFORMIN 1000 MG tablet Commonly known as: GLUCOPHAGE Take 1,000 mg by mouth 2 (two) times daily.   metoprolol tartrate 25 MG tablet Commonly known as: LOPRESSOR Take one and a half (1.5) tablets (37.5 mg) by mouth twice (2) daily. What changed:   how much to take  how to take this  when to take this  additional instructions   nitroGLYCERIN 0.4 MG SL tablet Commonly known as: NITROSTAT DISSOLVE 1 TABLET UNDER THE TONGUE EVERY 5 MINUTES AS  NEEDED FOR CHEST PAIN. MAX  OF 3 TABLETS IN 15 MINUTES. CALL 911 IF PAIN PERSISTS. What changed: See the new instructions.   Oxycodone HCl 10 MG Tabs Take 1 tablet (10 mg total) by mouth every 4 (four) hours as needed.   phenazopyridine 200 MG tablet Commonly known as: Pyridium Take 1 tablet (200 mg total) by mouth 3 (three) times daily as needed for pain.   Vitamin D 50 MCG (2000 UT) tablet Take 2,000 Units by mouth daily.       Allergies:   Patient has no known allergies.   Social History   Socioeconomic History  . Marital status: Widowed    Spouse name: Not on file  . Number of children: 2  . Years of education: college  . Highest education level: Not on file  Occupational History  . Occupation: Disability  Tobacco Use  . Smoking status: Former Smoker    Packs/day: 0.50    Years: 36.00    Pack years: 18.00    Types: Cigarettes    Quit date: 06/12/2014    Years since quitting: 4.8  . Smokeless tobacco: Never Used  Substance and Sexual Activity  . Alcohol use: No    Alcohol/week: 0.0 standard drinks  . Drug use: No  . Sexual activity: Not Currently  Other Topics Concern  . Not on file  Social History Narrative   Patient currently lives at home with her disabled husband who was  shot in 2009 and he is total care. She lives with her son and daughter and there is an 8 that comes in. Patient currently is on disability   She occasionally drinks caffeine.    Social Determinants of Health   Financial Resource Strain:   . Difficulty of Paying Living Expenses: Not on file  Food Insecurity:   . Worried About Charity fundraiser in the Last Year: Not on file  . Ran Out of Food in the Last Year: Not on file  Transportation Needs:   . Lack of Transportation (Medical): Not on file  . Lack of Transportation (Non-Medical): Not on file  Physical Activity:   . Days of Exercise per Week: Not on file  . Minutes of Exercise per Session: Not on file  Stress:   . Feeling of Stress : Not on file  Social Connections:   . Frequency of Communication with Friends and Family: Not on file  . Frequency of Social Gatherings with Friends and Family: Not on file  . Attends Religious Services: Not on file  . Active Member of Clubs or Organizations: Not on file  . Attends Archivist Meetings: Not on file  . Marital Status: Not on file     Family History:  The patient's family history includes Alzheimer's disease in an other family member; Breast cancer in her mother and another family member; Depression in her son; Diabetes in her mother and another family member; Heart failure in her mother; Stroke in her maternal aunt.   ROS:   Please see the history of present illness.    ROS All other systems reviewed and are negative.   PHYSICAL EXAM:   VS:  BP (!) 172/93   Pulse 82   Ht 5\' 6"  (1.676 m)   Wt 223 lb (101.2 kg)   SpO2 98%   BMI 35.99 kg/m    GENERAL:  Well appearing, overweight BF in NAD HEENT:  PERRL, EOMI, sclera are clear. Oropharynx is clear. NECK:  No jugular venous  distention, carotid upstroke brisk and symmetric, no bruits, no thyromegaly or adenopathy LUNGS:  Clear to auscultation bilaterally CHEST:  Unremarkable HEART:  RRR,  PMI not displaced or  sustained,S1 and S2 within normal limits, no S3, no S4: no clicks, no rubs, no murmurs ABD:  Soft, nontender. BS +, no masses or bruits. No hepatomegaly, no splenomegaly EXT:  2 + pulses throughout, no edema, no cyanosis no clubbing SKIN:  Warm and dry.  No rashes NEURO:  Alert and oriented x 3. Cranial nerves II through XII intact. PSYCH:  Cognitively intact      Wt Readings from Last 3 Encounters:  04/04/19 223 lb (101.2 kg)  01/10/19 227 lb 1.2 oz (103 kg)  01/03/19 217 lb 9.6 oz (98.7 kg)      Studies/Labs Reviewed:   EKG:  EKG is not  ordered today.     Recent Labs: 01/10/2019: BUN 21; Creatinine, Ser 1.08; Hemoglobin 14.4; Platelets 340; Potassium 4.1; Sodium 139   Lipid Panel    Component Value Date/Time   CHOL 149 11/12/2017 1049   TRIG 103 11/12/2017 1049   HDL 59 11/12/2017 1049   CHOLHDL 2.5 09/19/2016 0900   CHOLHDL 2.9 10/05/2015 0305   VLDL 28 10/05/2015 0305   LDLCALC 69 11/12/2017 1049    Additional studies/ records that were reviewed today include:   Labs from primary care: 10/22/15: cholesterol 151, triglycerides 180, LDL 67, HDL 48. A1c 7.6%. CMET and CBC normal.  Dated 11/25/16: cholesterol 183, triglycerides 117, HDL 62, LDL 98. A1c 7.1%.  Dated 12/10/18: cholesterol 216, triglycerides 146, HDL 52, LDL 134. CMET normal Dated 01/03/19: A1c 7.3. CBC normal.  Echocardiogram: 10/05/15 LV EF: 55% -   60%  ------------------------------------------------------------------- Indications:      CAD of native vessels 414.01.  ------------------------------------------------------------------- History:   Risk factors:  Hypertension. Diabetes mellitus. Dyslipidemia.  ------------------------------------------------------------------- Study Conclusions  - Left ventricle: The cavity size was normal. Wall thickness was   increased in a pattern of mild LVH. Systolic function was normal.   The estimated ejection fraction was in the range of 55% to 60%.    Inferior wall hypokinesis. Doppler parameters are consistent with   abnormal left ventricular relaxation (grade 1 diastolic   dysfunction). The E/e&' ratio is between 8-15, suggesting   indeterminate LV filling pressure. - Ventricular septum: Septal motion showed abnormal function and   dyssynergy. - Aortic valve: Trileaflet. Sclerosis without stenosis. There was   no regurgitation. - Mitral valve: Mildly thickened leaflets . There was trivial   regurgitation. - Right ventricle: The cavity size was normal. Systolic function is   low normal. - Right atrium: The atrium was normal in size. - Tricuspid valve: There was trivial regurgitation. - Pulmonary arteries: PA peak pressure: 24 mm Hg (S). - Inferior vena cava: The vessel was normal in size. The   respirophasic diameter changes were in the normal range (>= 50%),   consistent with normal central venous pressure.  Impressions:  - Compared to a recent echo in 06/2015, the LVEF is stable at   55-60%, however, there is inferior hypokinesis and relatively   hyperdynamic anterior wall motion.  Cardiac Catheterization: 10/04/15    Prox LAD lesion, 85% stenosed. Patent LIMA to LAD.  Severe native 3 vessel CAD.  Lat 1st Mrg-2 lesion, 100% stenosed. Occluded SVG to OM.  Ost RCA to Mid RCA lesion, 70% stenosed. Ocluded SVG to RCA,  Dist RCA lesion, 99% stenosed. Post intervention with a 2.25 x 12 [employer],  there is a 0% residual stenosis.  Normal LVEDP.   Switch to Brilinta from Plavix.  She needs aggressive secondary prevention.  Patient with residual ST elevation in the inferior leads but her chest pain has improved significantly.  Proximal RCA disease did not seem critical, compared to distal lesion.    Echo 03/03/18: Study Conclusions  - Left ventricle: The cavity size was normal. Wall thickness was   increased in a pattern of mild LVH. Systolic function was mildly   reduced. The estimated ejection fraction was in the  range of 45%   to 50%. Severe inferior hypokinesis to akinesis. Doppler   parameters are consistent with abnormal left ventricular   relaxation (grade 1 diastolic dysfunction). The E/e&' ratio is   between 8-15, suggesting indeterminate LV filling pressure. - Aortic valve: Trileaflet. Sclerosis without stenosis. There was   no regurgitation. - Mitral valve: Mildly thickened leaflets . There was trivial   regurgitation. - Left atrium: The atrium was normal in size. - Inferior vena cava: The vessel was normal in size. The   respirophasic diameter changes were in the normal range (>= 50%),   consistent with normal central venous pressure.  Impressions:  - Compared to a prior study in 09/2015, the LVEF is lower at 45-50%   - global hypokinesis with inferior wall hypokinesis is noted.   ASSESSMENT & PLAN:    1. CAD s/p CABG 06/2015 with early graft failure.  - STEMI 10/04/15 with early loss of SVG to OM & SVG to RCA  s/p PCI to distal RCA lesion with a  Promus Premier stent. Patent LIMA to LAD.   -  She is doing well with no significant angina - Continue ASA,  statin, metoprolol and imdur.   2. HLD - Continue statin - noted her last LDL was 134. She is on maximal lipitor therapy. Will ask Pharmacy to see to consider PCSK 9 inhibitor. Cost may be an issue. ? If we can help with foundation.   3. HTN - BP is high today - Will check her BP daily at home. She understands goal is < 130/80. Will let us know if it remains high.   4. DM - Managed by PCP   5. Psoriatic arthritis.   6. Renal stones. Per Urology.   Medication Adjustments/Labs and Tests Ordered: Current medicines are reviewed at length with the patient today.  Concerns regarding medicines are outlined above.  Medication changes, Labs and Tests ordered today are listed in the Patient Instructions below. Patient Instructions  Medication Instructions:  NO CHANGES *If you need a refill on your cardiac medications before  your next appointment, please call your pharmacy*  Lab Work: NONE If you have labs (blood work) drawn today and your tests are completely normal, you will receive your results only by: Marland Kitchen MyChart Message (if you have MyChart) OR . A paper copy in the mail If you have any lab test that is abnormal or we need to change your treatment, we will call you to review the results.    Follow-Up: At [facility], you and your health needs are our priority.  As part of our continuing mission to provide you with exceptional heart care, we have created designated Provider Care Teams.  These Care Teams include your primary Cardiologist (physician) and Advanced Practice Providers (APPs -  Physician Assistants and Nurse Practitioners) who all work together to provide you with the care you need, when you need it.  Your next appointment:  6 month(s)  The format for your next appointment:   In Person  Provider:   Everette Dimauro, Martinique MD  Other Instructions SCHEDULE [place]     Signed, Mishika Flippen Martinique, MD  04/04/2019 2:31 PM    [facility] [address], [place], [place]  [address] Phone: [phone]; Fax: [phone]

## 2019-03-30 ENCOUNTER — Telehealth: Payer: Self-pay | Admitting: Cardiology

## 2019-03-30 NOTE — Telephone Encounter (Signed)
Spoke to patient she stated she has no symptoms of covid.Advised ok to keep appointment as planned with Dr.Jordan 04/04/19 at 2:00 pm.Advised it is reccommended she receive covid vaccine.

## 2019-03-30 NOTE — Telephone Encounter (Signed)
New Message:       Pt was around somebody that had COVID. She tested negative and have been in quarantine for 10 days as of midnight tonight. She have an appt with Dr Martinique on Monday, will she be able to keep her appt or need to reschedule? She also wants to know with her heart problems, will it be alright to have the COVID Vaccine?

## 2019-04-04 ENCOUNTER — Other Ambulatory Visit: Payer: Self-pay

## 2019-04-04 ENCOUNTER — Ambulatory Visit (INDEPENDENT_AMBULATORY_CARE_PROVIDER_SITE_OTHER): Payer: Medicare HMO | Admitting: Cardiology

## 2019-04-04 ENCOUNTER — Encounter: Payer: Self-pay | Admitting: Cardiology

## 2019-04-04 VITALS — BP 172/93 | HR 82 | Ht 66.0 in | Wt 223.0 lb

## 2019-04-04 DIAGNOSIS — E785 Hyperlipidemia, unspecified: Secondary | ICD-10-CM | POA: Diagnosis not present

## 2019-04-04 DIAGNOSIS — E118 Type 2 diabetes mellitus with unspecified complications: Secondary | ICD-10-CM | POA: Diagnosis not present

## 2019-04-04 DIAGNOSIS — Z951 Presence of aortocoronary bypass graft: Secondary | ICD-10-CM | POA: Diagnosis not present

## 2019-04-04 DIAGNOSIS — I2510 Atherosclerotic heart disease of native coronary artery without angina pectoris: Secondary | ICD-10-CM

## 2019-04-04 DIAGNOSIS — I10 Essential (primary) hypertension: Secondary | ICD-10-CM | POA: Diagnosis not present

## 2019-04-04 NOTE — Patient Instructions (Signed)
Medication Instructions:  NO CHANGES *If you need a refill on your cardiac medications before your next appointment, please call your pharmacy*  Lab Work: NONE If you have labs (blood work) drawn today and your tests are completely normal, you will receive your results only by: Marland Kitchen MyChart Message (if you have MyChart) OR . A paper copy in the mail If you have any lab test that is abnormal or we need to change your treatment, we will call you to review the results.    Follow-Up: At [facility], you and your health needs are our priority.  As part of our continuing mission to provide you with exceptional heart care, we have created designated Provider Care Teams.  These Care Teams include your primary Cardiologist (physician) and Advanced Practice Providers (APPs -  Physician Assistants and Nurse Practitioners) who all work together to provide you with the care you need, when you need it.  Your next appointment:   6 month(s)  The format for your next appointment:   In Person  Provider:   PETER, Martinique MD  Other Instructions SCHEDULE [place]

## 2019-04-13 ENCOUNTER — Encounter: Payer: Self-pay | Admitting: Cardiovascular Disease

## 2019-04-13 DIAGNOSIS — E1121 Type 2 diabetes mellitus with diabetic nephropathy: Secondary | ICD-10-CM | POA: Diagnosis not present

## 2019-04-13 DIAGNOSIS — Z7984 Long term (current) use of oral hypoglycemic drugs: Secondary | ICD-10-CM | POA: Diagnosis not present

## 2019-04-13 DIAGNOSIS — Z23 Encounter for immunization: Secondary | ICD-10-CM | POA: Diagnosis not present

## 2019-04-13 DIAGNOSIS — I2510 Atherosclerotic heart disease of native coronary artery without angina pectoris: Secondary | ICD-10-CM | POA: Diagnosis not present

## 2019-04-13 DIAGNOSIS — I10 Essential (primary) hypertension: Secondary | ICD-10-CM | POA: Diagnosis not present

## 2019-04-13 DIAGNOSIS — E1151 Type 2 diabetes mellitus with diabetic peripheral angiopathy without gangrene: Secondary | ICD-10-CM | POA: Diagnosis not present

## 2019-04-21 ENCOUNTER — Encounter: Payer: Self-pay | Admitting: Pharmacist Clinician (PhC)/ Clinical Pharmacy Specialist

## 2019-04-21 ENCOUNTER — Other Ambulatory Visit: Payer: Self-pay

## 2019-04-21 ENCOUNTER — Ambulatory Visit (INDEPENDENT_AMBULATORY_CARE_PROVIDER_SITE_OTHER): Payer: Medicare HMO | Admitting: Pharmacist Clinician (PhC)/ Clinical Pharmacy Specialist

## 2019-04-21 DIAGNOSIS — E785 Hyperlipidemia, unspecified: Secondary | ICD-10-CM | POA: Diagnosis not present

## 2019-04-21 NOTE — Progress Notes (Signed)
04/21/2019 [name] [name] [birth year] [id number]   HPI:  [name] [name] is a 59 y.o. female patient of Dr Martinique, who presents today for a lipid clinic evaluation.  See pertinent past medical history below.  In April of 2017 she had a triple bypass, then in July of that same year was found to have a 99% distal RCA stenosis and 2 of the 3 bypasses were stenosed.  Since then she has had a few episodes of chest pain but no further interventions.  At the time of her bypass surgery her LDL cholesterol was 197.   Today she is in the office to discuss options for treating her cholesterol.  She has been feeling well lately, although, like most, she is getting tired of restrictions as well as living with her son.  She is hopeful to get her own place soon, as she would love to have a dedicated space to put an exercise bike or elliptical.    Past Medical History: CAD S/p  NSTEMI with CABG x 3 4/17;  STEMI 7/17 - DES to distal RCA, 2/3 bypasses occluded; hx of stroke  HTN Well controlled, on metoprolol tartrate 37.5 mg bid  DM2 Metformin 1 gm bid, Victoza 1.2 mg hs  A1c.9 1/21  CHF Most recent echo 2018 showed EF at 45-50%  Psoriatic arthritis otezla caused nausea chronically    Current Medications: atorvastatin 80 mg daily  Cholesterol Goals: LDL < 70   Intolerant/previously tried: pravastatin 40, simvastatin 10 and 40 - could not get to goal  Family history: mother died from CHF with DM at 56 died from breast cancer;  Father died when pt was 43, age 63 4 brothers, 2 sisters  - all half siblings from mother;  1 sister hit by car at 99 died,  1 brother died from DM complications 2 sons - younger is 26 with DM   Diet: does not do takeout except rarely (maybe once a month); tries to avoid processed foods, but lives with son, has to eat what he buys; prefers veggies and fruits but doesn't always get in as much as she would like  Exercise:  Has free membership to [employer] but hesitant to go because of  COVID.  Has a cousin who volunteered to take her when going for walks, but states they walk at different speeds  Labs: TC 188, TG 151 HDL 54 LDL 108  Current Outpatient Medications  Medication Sig Dispense Refill  . acetaminophen (TYLENOL) 325 MG tablet Take 2 tablets (650 mg total) by mouth every 4 (four) hours as needed for headache or mild pain.    Marland Kitchen aspirin EC 81 MG EC tablet Take 1 tablet (81 mg total) by mouth daily.    Marland Kitchen atorvastatin (LIPITOR) 80 MG tablet TAKE 1 TABLET EVERY DAY  AT  6PM (Patient taking differently: Take 80 mg by mouth daily at 6 PM. ) 90 tablet 6  . Biotin w/ Vitamins C & E (HAIR/SKIN/NAILS PO) Take 5,000 mg by mouth daily at 12 noon.    . cetirizine (ZYRTEC) 10 MG tablet Take 10 mg by mouth daily.  2  . Cholecalciferol (VITAMIN D) 2000 units tablet Take 2,000 Units by mouth daily.     . famotidine (PEPCID) 20 MG tablet Take 20 mg by mouth at bedtime as needed for heartburn or indigestion.    . Iron-Vitamins (GERITOL COMPLETE) TABS Take 1 tablet by mouth daily.     . isosorbide mononitrate (IMDUR) 30 MG 24 hr  tablet Take 0.5 tablets (15 mg total) by mouth daily. 45 tablet 3  . liraglutide (VICTOZA) 18 MG/3ML SOPN Inject 1.2 mg into the skin at bedtime.     . metFORMIN (GLUCOPHAGE) 1000 MG tablet Take 1,000 mg by mouth 2 (two) times daily.    . metoprolol tartrate (LOPRESSOR) 25 MG tablet Take one and a half (1.5) tablets (37.5 mg) by mouth twice (2) daily. (Patient taking differently: Take 37.5 mg by mouth 2 (two) times daily. ) 270 tablet 1  . nitroGLYCERIN (NITROSTAT) 0.4 MG SL tablet DISSOLVE 1 TABLET UNDER THE TONGUE EVERY 5 MINUTES AS  NEEDED FOR CHEST PAIN. MAX  OF 3 TABLETS IN 15 MINUTES. CALL 911 IF PAIN PERSISTS. (Patient taking differently: Place 0.4 mg under the tongue every 5 (five) minutes as needed for chest pain. ) 100 tablet 3   No current facility-administered medications for this visit.    No Known Allergies  Past Medical History:  Diagnosis Date   . Acute ST elevation myocardial infarction (STEMI) involving right coronary artery ([place]) 10/04/2015  . Anxiety   . Anxiety and depression   . CAD (coronary artery disease)    a. CABG 06/2015: LIMA-LAD, SVG-RCA, SVG-OM2 b. STEMI s/p PCI to distal RCA lesion with a small Promus Premier stent. ( patent LIMA to the LAD, occluded SVG to OM, occluded SVG to RCA.)  . CAD (coronary artery disease) of artery bypass graft; occluded SVG to RCA and occluded SVG to OM 10/08/2015  . Empty sella ([place])   . Hidradenitis axillaris   . Hyperlipemia   . Hypertension   . Kidney stones   . Lupus ([place]) dx'd 2008   "they think I have this; have to get retested" (06/12/2015)  . Obesity (BMI 30.0-34.9)   . Ovarian cyst   . Pneumonia X 2  . PONV (postoperative nausea and vomiting)   . Rheumatoid aortitis   . Rheumatoid arthritis ([place])   . S/P angioplasty with stent; 10/04/15 to distal RCA lesion. with Promus premier. 10/08/2015  . Scoliosis   . Stroke [facility]) March 2014   left MCA branches; "they say I have them all the time", denies residual on 06/12/2015  . Tobacco abuse   . Type II diabetes mellitus (HCC)     Height 5' 6.25" (1.683 m).   Dyslipidemia-LDL 197, goal < 70 Patient with familial hyperlipidemia (baseline LDL 197 in March 2017), now better with atorvastatin 80, but still not to goal of < 70.  Reviewed options of Repatha and Nexletol.  Discussed mechanisms of action, side effects, dosing and potential LDL decreases.  Patient agreeable to start Repatha 420 mg pushtronix once monthly.   We will start paperwork to get approved by insurance and patient was given information on [employer] to help with costs of both the Repatha and her atorvastatin.  She will need to repeat labs after 3 doses to be sure effective.     Tommy Medal PharmD CPP [facility] [address] [place], [place] [address] [phone]

## 2019-04-21 NOTE — Assessment & Plan Note (Signed)
Patient with familial hyperlipidemia (baseline LDL 197 in March 2017), now better with atorvastatin 80, but still not to goal of < 70.  Reviewed options of Repatha and Nexletol.  Discussed mechanisms of action, side effects, dosing and potential LDL decreases.  Patient agreeable to start Repatha 420 mg pushtronix once monthly.   We will start paperwork to get approved by insurance and patient was given information on [employer] to help with costs of both the Repatha and her atorvastatin.  She will need to repeat labs after 3 doses to be sure effective.

## 2019-04-21 NOTE — Patient Instructions (Addendum)
Your Results:             Your most recent labs Goal  Total Cholesterol 188 < 200  Triglycerides 151 < 150  HDL (happy/good cholesterol) 54 > 40  LDL (lousy/bad cholesterol 108 < 70      Medication changes:  We will start the paperwork to get you approved for Repatha once monthly infusor.  Once approved we will send to [facility] mail order  Lab orders:  After 3 doses of the Repatha we will repeat your labs.  We will mail the lab orders to you in about 2 months.   Patient Assistance:  The [employer] Well foundation offers assistance to help pay for medication copays.  They will cover copays for all cholesterol lowering meds, including statins, fibrates, omega-3 oils, ezetimibe, Repatha, Praluent, Nexletol, Nexlizet.  The cards are usually good for $2,500 or 12 months, whichever comes first. 1. Go to healthwellfoundation.org 2. Click on "Apply Now" 3. Answer questions as to whom is applying (patient or representative) 4. Your disease fund will be "hypercholesterolemia - Medicare access" 5. They will ask questions about finances and which medications you are taking for cholesterol 6. When you submit, the approval is usually within minutes.  You will need to print the card information from the site 7. You will need to show this information to your pharmacy, they will bill your Medicare Part D plan first -then bill Health Well --for the copay.   You can also call them at [phone], although the hold times can be quite long.   Thank you for choosing [facility]

## 2019-04-25 ENCOUNTER — Telehealth: Payer: Self-pay

## 2019-04-25 MED ORDER — REPATHA PUSHTRONEX SYSTEM 420 MG/3.5ML ~~LOC~~ SOCT: 420.0000 mg | SUBCUTANEOUS | 11 refills | Status: DC

## 2019-04-25 NOTE — Telephone Encounter (Signed)
Called and spoke w/pt regarding the approval of the pushtronix they voiced understanding and sent the rx. I instructed the pt to callback if they have any other issues

## 2019-04-29 DIAGNOSIS — E119 Type 2 diabetes mellitus without complications: Secondary | ICD-10-CM | POA: Diagnosis not present

## 2019-05-11 ENCOUNTER — Ambulatory Visit
Admission: RE | Admit: 2019-05-11 | Discharge: 2019-05-11 | Disposition: A | Discharge status: Home / Self Care | Payer: Medicare HMO | Source: Ambulatory Visit | Attending: Internal Medicine | Admitting: Internal Medicine

## 2019-05-11 ENCOUNTER — Other Ambulatory Visit: Payer: Self-pay

## 2019-05-11 ENCOUNTER — Other Ambulatory Visit: Payer: Self-pay | Admitting: Internal Medicine

## 2019-05-11 DIAGNOSIS — E785 Hyperlipidemia, unspecified: Secondary | ICD-10-CM | POA: Diagnosis not present

## 2019-05-11 DIAGNOSIS — S8992XA Unspecified injury of left lower leg, initial encounter: Secondary | ICD-10-CM | POA: Diagnosis not present

## 2019-05-17 DIAGNOSIS — N200 Calculus of kidney: Secondary | ICD-10-CM | POA: Diagnosis not present

## 2019-05-20 DIAGNOSIS — M545 Low back pain: Secondary | ICD-10-CM | POA: Diagnosis not present

## 2019-05-20 DIAGNOSIS — N200 Calculus of kidney: Secondary | ICD-10-CM | POA: Diagnosis not present

## 2019-05-20 DIAGNOSIS — R8279 Other abnormal findings on microbiological examination of urine: Secondary | ICD-10-CM | POA: Diagnosis not present

## 2019-05-24 DIAGNOSIS — E119 Type 2 diabetes mellitus without complications: Secondary | ICD-10-CM | POA: Diagnosis not present

## 2019-06-09 DIAGNOSIS — E1165 Type 2 diabetes mellitus with hyperglycemia: Secondary | ICD-10-CM | POA: Diagnosis not present

## 2019-06-09 DIAGNOSIS — I2510 Atherosclerotic heart disease of native coronary artery without angina pectoris: Secondary | ICD-10-CM | POA: Diagnosis not present

## 2019-06-09 DIAGNOSIS — M059 Rheumatoid arthritis with rheumatoid factor, unspecified: Secondary | ICD-10-CM | POA: Diagnosis not present

## 2019-06-09 DIAGNOSIS — E785 Hyperlipidemia, unspecified: Secondary | ICD-10-CM | POA: Diagnosis not present

## 2019-06-09 DIAGNOSIS — I5033 Acute on chronic diastolic (congestive) heart failure: Secondary | ICD-10-CM | POA: Diagnosis not present

## 2019-06-09 DIAGNOSIS — E1121 Type 2 diabetes mellitus with diabetic nephropathy: Secondary | ICD-10-CM | POA: Diagnosis not present

## 2019-06-09 DIAGNOSIS — I10 Essential (primary) hypertension: Secondary | ICD-10-CM | POA: Diagnosis not present

## 2019-06-09 DIAGNOSIS — I2102 ST elevation (STEMI) myocardial infarction involving left anterior descending coronary artery: Secondary | ICD-10-CM | POA: Diagnosis not present

## 2019-06-09 DIAGNOSIS — F329 Major depressive disorder, single episode, unspecified: Secondary | ICD-10-CM | POA: Diagnosis not present

## 2019-07-08 DIAGNOSIS — F329 Major depressive disorder, single episode, unspecified: Secondary | ICD-10-CM | POA: Diagnosis not present

## 2019-07-08 DIAGNOSIS — I5033 Acute on chronic diastolic (congestive) heart failure: Secondary | ICD-10-CM | POA: Diagnosis not present

## 2019-07-08 DIAGNOSIS — I2510 Atherosclerotic heart disease of native coronary artery without angina pectoris: Secondary | ICD-10-CM | POA: Diagnosis not present

## 2019-07-08 DIAGNOSIS — E785 Hyperlipidemia, unspecified: Secondary | ICD-10-CM | POA: Diagnosis not present

## 2019-07-08 DIAGNOSIS — E1121 Type 2 diabetes mellitus with diabetic nephropathy: Secondary | ICD-10-CM | POA: Diagnosis not present

## 2019-07-08 DIAGNOSIS — M059 Rheumatoid arthritis with rheumatoid factor, unspecified: Secondary | ICD-10-CM | POA: Diagnosis not present

## 2019-07-08 DIAGNOSIS — I2102 ST elevation (STEMI) myocardial infarction involving left anterior descending coronary artery: Secondary | ICD-10-CM | POA: Diagnosis not present

## 2019-07-08 DIAGNOSIS — I10 Essential (primary) hypertension: Secondary | ICD-10-CM | POA: Diagnosis not present

## 2019-07-08 DIAGNOSIS — E1165 Type 2 diabetes mellitus with hyperglycemia: Secondary | ICD-10-CM | POA: Diagnosis not present

## 2019-07-11 DIAGNOSIS — E119 Type 2 diabetes mellitus without complications: Secondary | ICD-10-CM | POA: Diagnosis not present

## 2019-07-29 DIAGNOSIS — I10 Essential (primary) hypertension: Secondary | ICD-10-CM | POA: Diagnosis not present

## 2019-07-29 DIAGNOSIS — E1165 Type 2 diabetes mellitus with hyperglycemia: Secondary | ICD-10-CM | POA: Diagnosis not present

## 2019-07-29 DIAGNOSIS — E1121 Type 2 diabetes mellitus with diabetic nephropathy: Secondary | ICD-10-CM | POA: Diagnosis not present

## 2019-07-29 DIAGNOSIS — I2510 Atherosclerotic heart disease of native coronary artery without angina pectoris: Secondary | ICD-10-CM | POA: Diagnosis not present

## 2019-07-29 DIAGNOSIS — F329 Major depressive disorder, single episode, unspecified: Secondary | ICD-10-CM | POA: Diagnosis not present

## 2019-07-29 DIAGNOSIS — E785 Hyperlipidemia, unspecified: Secondary | ICD-10-CM | POA: Diagnosis not present

## 2019-07-29 DIAGNOSIS — I5033 Acute on chronic diastolic (congestive) heart failure: Secondary | ICD-10-CM | POA: Diagnosis not present

## 2019-07-29 DIAGNOSIS — M059 Rheumatoid arthritis with rheumatoid factor, unspecified: Secondary | ICD-10-CM | POA: Diagnosis not present

## 2019-07-29 DIAGNOSIS — I2102 ST elevation (STEMI) myocardial infarction involving left anterior descending coronary artery: Secondary | ICD-10-CM | POA: Diagnosis not present

## 2019-08-12 DIAGNOSIS — E119 Type 2 diabetes mellitus without complications: Secondary | ICD-10-CM | POA: Diagnosis not present

## 2019-08-19 DIAGNOSIS — I10 Essential (primary) hypertension: Secondary | ICD-10-CM | POA: Diagnosis not present

## 2019-08-19 DIAGNOSIS — E1121 Type 2 diabetes mellitus with diabetic nephropathy: Secondary | ICD-10-CM | POA: Diagnosis not present

## 2019-08-29 DIAGNOSIS — I10 Essential (primary) hypertension: Secondary | ICD-10-CM | POA: Diagnosis not present

## 2019-08-29 DIAGNOSIS — I2510 Atherosclerotic heart disease of native coronary artery without angina pectoris: Secondary | ICD-10-CM | POA: Diagnosis not present

## 2019-08-29 DIAGNOSIS — M059 Rheumatoid arthritis with rheumatoid factor, unspecified: Secondary | ICD-10-CM | POA: Diagnosis not present

## 2019-08-29 DIAGNOSIS — E1121 Type 2 diabetes mellitus with diabetic nephropathy: Secondary | ICD-10-CM | POA: Diagnosis not present

## 2019-08-29 DIAGNOSIS — E1165 Type 2 diabetes mellitus with hyperglycemia: Secondary | ICD-10-CM | POA: Diagnosis not present

## 2019-08-29 DIAGNOSIS — E785 Hyperlipidemia, unspecified: Secondary | ICD-10-CM | POA: Diagnosis not present

## 2019-08-29 DIAGNOSIS — F329 Major depressive disorder, single episode, unspecified: Secondary | ICD-10-CM | POA: Diagnosis not present

## 2019-08-29 DIAGNOSIS — I5033 Acute on chronic diastolic (congestive) heart failure: Secondary | ICD-10-CM | POA: Diagnosis not present

## 2019-08-29 DIAGNOSIS — I2102 ST elevation (STEMI) myocardial infarction involving left anterior descending coronary artery: Secondary | ICD-10-CM | POA: Diagnosis not present

## 2019-08-31 DIAGNOSIS — R8279 Other abnormal findings on microbiological examination of urine: Secondary | ICD-10-CM | POA: Diagnosis not present

## 2019-08-31 DIAGNOSIS — N200 Calculus of kidney: Secondary | ICD-10-CM | POA: Diagnosis not present

## 2019-09-07 DIAGNOSIS — Z7984 Long term (current) use of oral hypoglycemic drugs: Secondary | ICD-10-CM | POA: Diagnosis not present

## 2019-09-07 DIAGNOSIS — H5203 Hypermetropia, bilateral: Secondary | ICD-10-CM | POA: Diagnosis not present

## 2019-09-07 DIAGNOSIS — H524 Presbyopia: Secondary | ICD-10-CM | POA: Diagnosis not present

## 2019-09-07 DIAGNOSIS — E1136 Type 2 diabetes mellitus with diabetic cataract: Secondary | ICD-10-CM | POA: Diagnosis not present

## 2019-09-07 DIAGNOSIS — H2513 Age-related nuclear cataract, bilateral: Secondary | ICD-10-CM | POA: Diagnosis not present

## 2019-09-29 NOTE — Progress Notes (Signed)
Cardiology Office Note    Date:  10/03/2019   ID:  [name] [name], DOB [birth year], MRN [id number]  PCP:  Leeroy Cha, MD  Cardiologist:  Dr. Martinique  Chief Complaint: follow up CAD  History of Present Illness:   [name] [name] is a 59 y.o. female with past medical history of CAD (s/p CABG on 06/19/2015 w/ LIMA-LAD, SVG-RCA, SVG-OM2), Lupus, Type 2 DM, HTN, and HLD who is seen for follow up CAD.   In April 2017 hospitalized with NSTEMI and cath with severe 3-vessel disease with 80% RCA stenosis, 85% Prox LAD stenosis, and 90% 1st Mrg with 85% 2nd Mrg. CABG was recommended and this was performed on 4/4 with the LIMA-LAD, SVG-RCA, and SVG-OM2. She was started on ASA and Plavix.  She presented 10/04/15 to [facility] with complained of chest discomfort for the past 3 weeks. Upon arrival to the ED, she was noted to have ST elevation in the inferior and lateral leads of her EKG. Emergently to cardiac catheterization revealed Patent LIMA to the LAD, occluded SVG to OM, occluded SVG to RCA.  Distal RCA 99% stenosed and she underwent emergent PCI with Promus DES to distal RCA. Her troponin peaked at 14. Plavix changed to [place]. Pt had persistent ST elevation inferiorly and tachycardia. Increased BB.Echo with EF 55-60% inferior hypokinesis and relatively hyperdynamic anterior wall motion.  Zocor changed to lipitor.   She was seen in December 2018 with atypical lateral chest pain, shoulder pain- worse with deep breathing. Troponin levels were normal. She had colonoscopy with polypectomy several days prior. There was mild edema noted on CXR and she was given IV lasix x1. Echo showed EF 45-50% that on my review was not significantly changed from prior. She was observed overnight and DC the next day.   She has been diagnosed with psoriatic arthritis. Followed by Rheumatology  This past fall she had recurrent UTIs and kidney stones. Had surgery but stone recurred. Followed by Dr Karsten Ro.   She was  seen in our lipid clinic in February. Recommended starting Repatha/ she is tolerating this well.   On follow up today she denies any chest pain or SOB. She works for [employer]. She is eating healthier and working with a nutritionist now. She is walking 3 days a week.   She is not smoking.    Past Medical History:  Diagnosis Date  . Acute ST elevation myocardial infarction (STEMI) involving right coronary artery ([place]) 10/04/2015  . Anxiety   . Anxiety and depression   . CAD (coronary artery disease)    a. CABG 06/2015: LIMA-LAD, SVG-RCA, SVG-OM2 b. STEMI s/p PCI to distal RCA lesion with a small Promus Premier stent. ( patent LIMA to the LAD, occluded SVG to OM, occluded SVG to RCA.)  . CAD (coronary artery disease) of artery bypass graft; occluded SVG to RCA and occluded SVG to OM 10/08/2015  . Empty sella ([place])   . Hidradenitis axillaris   . Hyperlipemia   . Hypertension   . Kidney stones   . Lupus ([place]) dx'd 2008   "they think I have this; have to get retested" (06/12/2015)  . Obesity (BMI 30.0-34.9)   . Ovarian cyst   . Pneumonia X 2  . PONV (postoperative nausea and vomiting)   . Rheumatoid aortitis   . Rheumatoid arthritis ([place])   . S/P angioplasty with stent; 10/04/15 to distal RCA lesion. with Promus premier. 10/08/2015  . Scoliosis   . Stroke [facility]) March 2014  left MCA branches; "they say I have them all the time", denies residual on 06/12/2015  . Tobacco abuse   . Type II diabetes mellitus ([place])     Past Surgical History:  Procedure Laterality Date  . ABDOMINAL HYSTERECTOMY    . BREAST BIOPSY Left   . CARDIAC CATHETERIZATION N/A 06/13/2015   Procedure: Left Heart Cath and Coronary Angiography;  Surgeon: Joceline Hinchcliff M Martinique, MD;  Location: [facility];  Service: Cardiovascular;  Laterality: N/A;  . CARDIAC CATHETERIZATION N/A 10/04/2015   Procedure: Left Heart Cath and Coronary Angiography;  Surgeon: [name], MD;  Location: [facility] [facility];   Service: Cardiovascular;  Laterality: N/A;  . CARDIAC CATHETERIZATION N/A 10/04/2015   Procedure: Coronary Stent Intervention;  Surgeon: [name], MD;  Location: [place] [facility];  Service: Cardiovascular;  Laterality: N/A;  . CORONARY ARTERY BYPASS GRAFT N/A 06/19/2015   Procedure: CORONARY ARTERY BYPASS GRAFTING (CABG) TIMES FOUR USING LEFT INTERNAL MAMMARY, RIGHT SAPHENOUS LEG VEIN AND CRYO SAPHENOUS VEIN;  Surgeon: [name], MD;  Location: [facility];  Service: Open Heart Surgery;  Laterality: N/A;  . DILATION AND CURETTAGE OF UTERUS    . IR URETERAL STENT LEFT NEW ACCESS W/O SEP NEPHROSTOMY CATH  01/10/2019  . NEPHROLITHOTOMY Left 01/10/2019   Procedure: NEPHROLITHOTOMY PERCUTANEOUS;  Surgeon: Kathie Rhodes, MD;  Location: [facility];  Service: Urology;  Laterality: Left;  . TEE WITHOUT CARDIOVERSION N/A 06/19/2015   Procedure: TRANSESOPHAGEAL ECHOCARDIOGRAM (TEE);  Surgeon: [name], MD;  Location: [facility];  Service: Open Heart Surgery;  Laterality: N/A;  . TONSILLECTOMY  1960s  . TUBAL LIGATION      Current Medications: Allergies as of 10/03/2019   No Known Allergies     Medication List       Accurate as of October 03, 2019  2:42 PM. If you have any questions, ask your nurse or doctor.        acetaminophen 325 MG tablet Commonly known as: TYLENOL Take 2 tablets (650 mg total) by mouth every 4 (four) hours as needed for headache or mild pain.   aspirin 81 MG EC tablet Take 1 tablet (81 mg total) by mouth daily.   atorvastatin 80 MG tablet Commonly known as: LIPITOR TAKE 1 TABLET EVERY DAY  AT  6PM What changed:   how much to take  how to take this  when to take this  additional instructions   cetirizine 10 MG tablet Commonly known as: ZYRTEC Take 10 mg by mouth daily.   famotidine 20 MG tablet Commonly known as: PEPCID Take 20 mg by mouth at bedtime as needed for heartburn or indigestion.   Geritol Complete Tabs Take 1 tablet by mouth daily.    HAIR/SKIN/NAILS PO Take 5,000 mg by mouth daily at 12 noon.   isosorbide mononitrate 30 MG 24 hr tablet Commonly known as: IMDUR Take 0.5 tablets (15 mg total) by mouth daily.   liraglutide 18 MG/3ML Sopn Commonly known as: VICTOZA Inject 1.2 mg into the skin at bedtime.   metFORMIN 1000 MG tablet Commonly known as: GLUCOPHAGE Take 1,000 mg by mouth 2 (two) times daily.   metoprolol tartrate 25 MG tablet Commonly known as: LOPRESSOR Take one and a half (1.5) tablets (37.5 mg) by mouth twice (2) daily. What changed:   how much to take  how to take this  when to take this  additional instructions   nitroGLYCERIN 0.4 MG SL tablet Commonly known as: NITROSTAT  DISSOLVE 1 TABLET UNDER THE TONGUE EVERY 5 MINUTES AS  NEEDED FOR CHEST PAIN. MAX  OF 3 TABLETS IN 15 MINUTES. CALL 911 IF PAIN PERSISTS. What changed: See the new instructions.   Repatha Pushtronex System 420 MG/3.5ML Soct Generic drug: Evolocumab with Infusor Inject 420 mg into the skin every 30 (thirty) days.   Vitamin D 50 MCG (2000 UT) tablet Take 2,000 Units by mouth daily.       Allergies:   Patient has no known allergies.   Social History   Socioeconomic History  . Marital status: Widowed    Spouse name: Not on file  . Number of children: 2  . Years of education: college  . Highest education level: Not on file  Occupational History  . Occupation: Disability  Tobacco Use  . Smoking status: Former Smoker    Packs/day: 0.50    Years: 36.00    Pack years: 18.00    Types: Cigarettes    Quit date: 06/12/2014    Years since quitting: 5.3  . Smokeless tobacco: Never Used  Vaping Use  . Vaping Use: Never used  Substance and Sexual Activity  . Alcohol use: No    Alcohol/week: 0.0 standard drinks  . Drug use: No  . Sexual activity: Not Currently  Other Topics Concern  . Not on file  Social History Narrative   Patient currently lives at home with her disabled husband who was shot in 2009 and he  is total care. She lives with her son and daughter and there is an 8 that comes in. Patient currently is on disability   She occasionally drinks caffeine.    Social Determinants of Health   Financial Resource Strain:   . Difficulty of Paying Living Expenses:   Food Insecurity:   . Worried About Charity fundraiser in the Last Year:   . Arboriculturist in the Last Year:   Transportation Needs:   . Film/video editor (Medical):   Marland Kitchen Lack of Transportation (Non-Medical):   Physical Activity:   . Days of Exercise per Week:   . Minutes of Exercise per Session:   Stress:   . Feeling of Stress :   Social Connections:   . Frequency of Communication with Friends and Family:   . Frequency of Social Gatherings with Friends and Family:   . Attends Religious Services:   . Active Member of Clubs or Organizations:   . Attends Archivist Meetings:   Marland Kitchen Marital Status:      Family History:  The patient's family history includes Alzheimer's disease in an other family member; Breast cancer in her mother and another family member; Depression in her son; Diabetes in her mother and another family member; Heart failure in her mother; Stroke in her maternal aunt.   ROS:   Please see the history of present illness.    ROS All other systems reviewed and are negative.   PHYSICAL EXAM:   VS:  BP (!) 142/80   Pulse 86   Ht 5\' 6"  (1.676 m)   Wt 224 lb (101.6 kg)   BMI 36.15 kg/m    GENERAL:  Well appearing, overweight BF in NAD HEENT:  PERRL, EOMI, sclera are clear. Oropharynx is clear. NECK:  No jugular venous distention, carotid upstroke brisk and symmetric, no bruits, no thyromegaly or adenopathy LUNGS:  Clear to auscultation bilaterally CHEST:  Unremarkable HEART:  RRR,  PMI not displaced or sustained,S1 and S2 within normal limits, no  S3, no S4: no clicks, no rubs, no murmurs ABD:  Soft, nontender. BS +, no masses or bruits. No hepatomegaly, no splenomegaly EXT:  2 + pulses  throughout, no edema, no cyanosis no clubbing SKIN:  Warm and dry.  No rashes NEURO:  Alert and oriented x 3. Cranial nerves II through XII intact. PSYCH:  Cognitively intact      Wt Readings from Last 3 Encounters:  10/03/19 224 lb (101.6 kg)  04/04/19 223 lb (101.2 kg)  01/10/19 227 lb 1.2 oz (103 kg)      Studies/Labs Reviewed:   EKG:  EKG is not  ordered today.     Recent Labs: 01/10/2019: BUN 21; Creatinine, Ser 1.08; Hemoglobin 14.4; Platelets 340; Potassium 4.1; Sodium 139   Lipid Panel    Component Value Date/Time   CHOL 149 11/12/2017 1049   TRIG 103 11/12/2017 1049   HDL 59 11/12/2017 1049   CHOLHDL 2.5 09/19/2016 0900   CHOLHDL 2.9 10/05/2015 0305   VLDL 28 10/05/2015 0305   LDLCALC 69 11/12/2017 1049    Additional studies/ records that were reviewed today include:   Labs from primary care: 10/22/15: cholesterol 151, triglycerides 180, LDL 67, HDL 48. A1c 7.6%. CMET and CBC normal.  Dated 11/25/16: cholesterol 183, triglycerides 117, HDL 62, LDL 98. A1c 7.1%.  Dated 12/10/18: cholesterol 216, triglycerides 146, HDL 52, LDL 134. CMET normal Dated 01/03/19: A1c 7.3. CBC normal. Dated 08/19/19: A1c 7.8%.   Echocardiogram: 10/05/15 LV EF: 55% -   60%  ------------------------------------------------------------------- Indications:      CAD of native vessels 414.01.  ------------------------------------------------------------------- History:   Risk factors:  Hypertension. Diabetes mellitus. Dyslipidemia.  ------------------------------------------------------------------- Study Conclusions  - Left ventricle: The cavity size was normal. Wall thickness was   increased in a pattern of mild LVH. Systolic function was normal.   The estimated ejection fraction was in the range of 55% to 60%.   Inferior wall hypokinesis. Doppler parameters are consistent with   abnormal left ventricular relaxation (grade 1 diastolic   dysfunction). The E/e&' ratio is  between 8-15, suggesting   indeterminate LV filling pressure. - Ventricular septum: Septal motion showed abnormal function and   dyssynergy. - Aortic valve: Trileaflet. Sclerosis without stenosis. There was   no regurgitation. - Mitral valve: Mildly thickened leaflets . There was trivial   regurgitation. - Right ventricle: The cavity size was normal. Systolic function is   low normal. - Right atrium: The atrium was normal in size. - Tricuspid valve: There was trivial regurgitation. - Pulmonary arteries: PA peak pressure: 24 mm Hg (S). - Inferior vena cava: The vessel was normal in size. The   respirophasic diameter changes were in the normal range (>= 50%),   consistent with normal central venous pressure.  Impressions:  - Compared to a recent echo in 06/2015, the LVEF is stable at   55-60%, however, there is inferior hypokinesis and relatively   hyperdynamic anterior wall motion.  Cardiac Catheterization: 10/04/15    Prox LAD lesion, 85% stenosed. Patent LIMA to LAD.  Severe native 3 vessel CAD.  Lat 1st Mrg-2 lesion, 100% stenosed. Occluded SVG to OM.  Ost RCA to Mid RCA lesion, 70% stenosed. Ocluded SVG to RCA,  Dist RCA lesion, 99% stenosed. Post intervention with a 2.25 x 12 Promus Premier, there is a 0% residual stenosis.  Normal LVEDP.   Switch to Brilinta from Plavix.  She needs aggressive secondary prevention.  Patient with residual ST elevation in the inferior leads but  her chest pain has improved significantly.  Proximal RCA disease did not seem critical, compared to distal lesion.    Echo 03/03/18: Study Conclusions  - Left ventricle: The cavity size was normal. Wall thickness was   increased in a pattern of mild LVH. Systolic function was mildly   reduced. The estimated ejection fraction was in the range of 45%   to 50%. Severe inferior hypokinesis to akinesis. Doppler   parameters are consistent with abnormal left ventricular   relaxation (grade 1  diastolic dysfunction). The E/e&' ratio is   between 8-15, suggesting indeterminate LV filling pressure. - Aortic valve: Trileaflet. Sclerosis without stenosis. There was   no regurgitation. - Mitral valve: Mildly thickened leaflets . There was trivial   regurgitation. - Left atrium: The atrium was normal in size. - Inferior vena cava: The vessel was normal in size. The   respirophasic diameter changes were in the normal range (>= 50%),   consistent with normal central venous pressure.  Impressions:  - Compared to a prior study in 09/2015, the LVEF is lower at 45-50%   - global hypokinesis with inferior wall hypokinesis is noted.   ASSESSMENT & PLAN:    1. CAD s/p CABG 06/2015 with early vein graft failure.  - STEMI 10/04/15 with early loss of SVG to OM & SVG to RCA  s/p PCI to distal RCA lesion with a  Promus Premier stent. Patent LIMA to LAD.   -  She is doing well with no significant angina - Continue ASA,  statin, metoprolol and imdur.   2. HLD - Continue statin and Repatha - will repeat lipid panel today  3. HTN - BP is controlled.   4. DM - Managed by PCP , last A1c 7.8%. working on diet now with nutritionist.   5. Psoriatic arthritis.   6. Renal stones. Per Urology.  Follow up 6 months   Medication Adjustments/Labs and Tests Ordered: Current medicines are reviewed at length with the patient today.  Concerns regarding medicines are outlined above.  Medication changes, Labs and Tests ordered today are listed in the Patient Instructions below. There are no Patient Instructions on file for this visit.   Signed, Mailey Landstrom Martinique, MD  10/03/2019 2:42 PM    [facility] [address], [place], [place]  [address] Phone: [phone]; Fax: [phone]

## 2019-10-03 ENCOUNTER — Ambulatory Visit: Payer: Medicare HMO | Admitting: Cardiology

## 2019-10-03 ENCOUNTER — Encounter: Payer: Self-pay | Admitting: Cardiology

## 2019-10-03 ENCOUNTER — Other Ambulatory Visit: Payer: Self-pay

## 2019-10-03 VITALS — BP 142/80 | HR 86 | Ht 66.0 in | Wt 224.0 lb

## 2019-10-03 DIAGNOSIS — E1151 Type 2 diabetes mellitus with diabetic peripheral angiopathy without gangrene: Secondary | ICD-10-CM | POA: Diagnosis not present

## 2019-10-03 DIAGNOSIS — E118 Type 2 diabetes mellitus with unspecified complications: Secondary | ICD-10-CM

## 2019-10-03 DIAGNOSIS — Z951 Presence of aortocoronary bypass graft: Secondary | ICD-10-CM

## 2019-10-03 DIAGNOSIS — I25118 Atherosclerotic heart disease of native coronary artery with other forms of angina pectoris: Secondary | ICD-10-CM

## 2019-10-03 DIAGNOSIS — E785 Hyperlipidemia, unspecified: Secondary | ICD-10-CM

## 2019-10-03 DIAGNOSIS — I10 Essential (primary) hypertension: Secondary | ICD-10-CM | POA: Diagnosis not present

## 2019-10-03 DIAGNOSIS — L4050 Arthropathic psoriasis, unspecified: Secondary | ICD-10-CM | POA: Diagnosis not present

## 2019-10-03 NOTE — Addendum Note (Signed)
Addended by: Kathyrn Lass on: 10/03/2019 02:47 PM   Modules accepted: Orders

## 2019-10-04 LAB — BASIC METABOLIC PANEL
BUN/Creatinine Ratio: 13 (ref 12–28)
BUN: 13 mg/dL (ref 8–27)
CO2: 23 mmol/L (ref 20–29)
Calcium: 9.9 mg/dL (ref 8.7–10.3)
Chloride: 105 mmol/L (ref 96–106)
Creatinine, Ser: 1 mg/dL (ref 0.57–1.00)
GFR calc Af Amer: 71 mL/min/{1.73_m2} (ref >59)
GFR calc non Af Amer: 61 mL/min/{1.73_m2} (ref >59)
Glucose: 73 mg/dL (ref 65–99)
Potassium: 4.9 mmol/L (ref 3.5–5.2)
Sodium: 145 mmol/L — ABNORMAL HIGH (ref 134–144)

## 2019-10-04 LAB — LIPID PANEL
Chol/HDL Ratio: 2 ratio (ref 0.0–4.4)
Cholesterol, Total: 128 mg/dL (ref 100–199)
HDL: 63 mg/dL (ref >39)
LDL Chol Calc (NIH): 32 mg/dL (ref 0–99)
Triglycerides: 211 mg/dL — ABNORMAL HIGH (ref 0–149)
VLDL Cholesterol Cal: 33 mg/dL (ref 5–40)

## 2019-10-04 LAB — HEPATIC FUNCTION PANEL
ALT: 21 IU/L (ref 0–32)
AST: 19 IU/L (ref 0–40)
Albumin: 4.2 g/dL (ref 3.8–4.9)
Alkaline Phosphatase: 112 IU/L (ref 48–121)
Bilirubin Total: 0.4 mg/dL (ref 0.0–1.2)
Bilirubin, Direct: 0.15 mg/dL (ref 0.00–0.40)
Total Protein: 7.3 g/dL (ref 6.0–8.5)

## 2019-10-07 DIAGNOSIS — H66013 Acute suppurative otitis media with spontaneous rupture of ear drum, bilateral: Secondary | ICD-10-CM | POA: Diagnosis not present

## 2019-10-07 DIAGNOSIS — I10 Essential (primary) hypertension: Secondary | ICD-10-CM | POA: Diagnosis not present

## 2019-10-11 DIAGNOSIS — I2102 ST elevation (STEMI) myocardial infarction involving left anterior descending coronary artery: Secondary | ICD-10-CM | POA: Diagnosis not present

## 2019-10-11 DIAGNOSIS — I2510 Atherosclerotic heart disease of native coronary artery without angina pectoris: Secondary | ICD-10-CM | POA: Diagnosis not present

## 2019-10-11 DIAGNOSIS — E785 Hyperlipidemia, unspecified: Secondary | ICD-10-CM | POA: Diagnosis not present

## 2019-10-11 DIAGNOSIS — M059 Rheumatoid arthritis with rheumatoid factor, unspecified: Secondary | ICD-10-CM | POA: Diagnosis not present

## 2019-10-11 DIAGNOSIS — I10 Essential (primary) hypertension: Secondary | ICD-10-CM | POA: Diagnosis not present

## 2019-10-11 DIAGNOSIS — E1165 Type 2 diabetes mellitus with hyperglycemia: Secondary | ICD-10-CM | POA: Diagnosis not present

## 2019-10-11 DIAGNOSIS — E1121 Type 2 diabetes mellitus with diabetic nephropathy: Secondary | ICD-10-CM | POA: Diagnosis not present

## 2019-10-11 DIAGNOSIS — F329 Major depressive disorder, single episode, unspecified: Secondary | ICD-10-CM | POA: Diagnosis not present

## 2019-10-11 DIAGNOSIS — I5033 Acute on chronic diastolic (congestive) heart failure: Secondary | ICD-10-CM | POA: Diagnosis not present

## 2019-11-10 DIAGNOSIS — F329 Major depressive disorder, single episode, unspecified: Secondary | ICD-10-CM | POA: Diagnosis not present

## 2019-11-10 DIAGNOSIS — I2510 Atherosclerotic heart disease of native coronary artery without angina pectoris: Secondary | ICD-10-CM | POA: Diagnosis not present

## 2019-11-10 DIAGNOSIS — M059 Rheumatoid arthritis with rheumatoid factor, unspecified: Secondary | ICD-10-CM | POA: Diagnosis not present

## 2019-11-10 DIAGNOSIS — E1165 Type 2 diabetes mellitus with hyperglycemia: Secondary | ICD-10-CM | POA: Diagnosis not present

## 2019-11-10 DIAGNOSIS — E785 Hyperlipidemia, unspecified: Secondary | ICD-10-CM | POA: Diagnosis not present

## 2019-11-10 DIAGNOSIS — I5033 Acute on chronic diastolic (congestive) heart failure: Secondary | ICD-10-CM | POA: Diagnosis not present

## 2019-11-10 DIAGNOSIS — I10 Essential (primary) hypertension: Secondary | ICD-10-CM | POA: Diagnosis not present

## 2019-11-10 DIAGNOSIS — E1121 Type 2 diabetes mellitus with diabetic nephropathy: Secondary | ICD-10-CM | POA: Diagnosis not present

## 2019-11-10 DIAGNOSIS — I2102 ST elevation (STEMI) myocardial infarction involving left anterior descending coronary artery: Secondary | ICD-10-CM | POA: Diagnosis not present

## 2019-11-23 ENCOUNTER — Other Ambulatory Visit: Payer: Self-pay | Admitting: Internal Medicine

## 2019-11-23 DIAGNOSIS — Z1231 Encounter for screening mammogram for malignant neoplasm of breast: Secondary | ICD-10-CM

## 2019-11-29 ENCOUNTER — Ambulatory Visit: Payer: Self-pay | Admitting: Surgery

## 2019-11-29 DIAGNOSIS — L732 Hidradenitis suppurativa: Secondary | ICD-10-CM | POA: Diagnosis not present

## 2019-11-29 NOTE — H&P (Signed)
History of Present Illness ([name] Furuya K. Stephenie Navejas MD; 11/29/2019 3:26 PM) The patient is a 59 year old female who presents with a complaint of Hidradenitis. PCP - Dr. Varadarajan Reason: Right axillary hidradenitis  This is a 59-year-old female with a long history of axillary hidradenitis. She was evaluated 4 years ago for tenderness in her right axilla. However at that time, there was nothing that needed be addressed surgically. The patient states that she has frequent flareups that spontaneously burst and then drain purulent fluid. Subsequently things heal up for a few weeks and then flares up again. She presents now to discuss a more definitive surgical intervention.     Problem List/Past Medical (Judea Riches K. Kamile Fassler, MD; 11/29/2019 3:26 PM) INCONTINENCE OF FECES, UNSPECIFIED FECAL INCONTINENCE TYPE (R15.9) HIDRADENITIS AXILLARIS (L73.2)  Past Surgical History (Versie Fleener K. Nevelyn Mellott, MD; 11/29/2019 3:26 PM) Breast Biopsy Bilateral. Colon Polyp Removal - Colonoscopy Hysterectomy (not due to cancer) - Complete Tonsillectomy Foot Surgery Right. Hysterectomy (not due to cancer) - Partial  Diagnostic Studies History (Lisa Caldwell, RMA; 11/29/2019 2:36 PM) Mammogram within last year >3 years ago Pap Smear >5 years ago  Allergies (Lisa Caldwell, RMA; 11/29/2019 2:36 PM) No Known Drug Allergies [02/21/2016]: Allergies Reconciled  Medication History (Lisa Caldwell, RMA; 11/29/2019 2:39 PM) Accu-Chek Softclix Lancets Active. Accu-Chek Aviva Plus (w/Device Kit,) Active. Atorvastatin Calcium (80MG Tablet, Oral) Active. MetFORMIN HCl (1000MG Tablet, Oral) Active. Metoprolol Tartrate (25MG Tablet, Oral) Active. Mucinex (600MG Tablet ER 12HR, Oral) Active. Victoza (18MG/3ML Soln Pen-inj, Subcutaneous) Active. Repatha Pushtronex System (420MG/3.5ML Soln Cartridge, Subcutaneous) Active. Potassium Citrate ER (15 MEQ(1620 MG) Tablet ER, Oral) Active. Medications  Reconciled Brilinta (90MG Tablet, Oral) Active. Clopidogrel Bisulfate (75MG Tablet, Oral) Active. Invokana (300MG Tablet, Oral) Active. Isosorbide Mononitrate ER (30MG Tablet ER 24HR, Oral) Active. Januvia (100MG Tablet, Oral) Active. Metoprolol Tartrate (37.5MG Tablet, Oral) Active. Simvastatin (40MG Tablet, Oral) Active. Nitroglycerin (0.4MG Tab Sublingual, Sublingual as needed) Active. Accu-Chek Aviva (In Vitro) Active. Accu-Chek Aviva Plus (In Vitro) Active. Vitamin D3 (50000UNIT Capsule, Oral) Active. Amoxicillin (500MG Capsule, Oral) Active.  Social History (Tyreik Delahoussaye K. Luceal Hollibaugh, MD; 11/29/2019 3:26 PM) Alcohol use Occasional alcohol use. Caffeine use Carbonated beverages, Tea. No drug use Tobacco use Former smoker. No alcohol use  Family History (Lisa Caldwell, RMA; 11/29/2019 2:36 PM) Arthritis Mother. Breast Cancer Mother. Diabetes Mellitus Brother, Mother, Son. Hypertension Daughter, Mother. Kidney Disease Father. Respiratory Condition Son. Thyroid problems Mother.  Pregnancy / Birth History (Persephanie Laatsch K. Aalivia Mcgraw, MD; 11/29/2019 3:26 PM) Age at menarche 7 years. Contraceptive History Oral contraceptives. Gravida 4 2 Length (months) of breastfeeding 3-6 Maternal age 15-20 21-25 Para 2 Age of menopause <45  Other Problems (Trevor Wilkie K. Jaymere Alen, MD; 11/29/2019 3:26 PM) Arthritis Cerebrovascular Accident Diabetes Mellitus Gastroesophageal Reflux Disease General anesthesia - complications High blood pressure Hypercholesterolemia Kidney Stone Myocardial infarction Sleep Apnea Congestive Heart Failure Depression     Review of Systems (Lisa Caldwell RMA; 11/29/2019 2:36 PM) General Present- Night Sweats. Not Present- Appetite Loss, Chills, Fatigue, Fever, Weight Gain and Weight Loss. Skin Not Present- Change in Wart/Mole, Dryness, Hives, Jaundice, New Lesions, Non-Healing Wounds, Rash and Ulcer. Female Genitourinary Present-  Frequency. Not Present- Nocturia, Painful Urination, Pelvic Pain and Urgency. Neurological Not Present- Decreased Memory, Fainting, Headaches, Numbness, Seizures, Tingling, Tremor, Trouble walking and Weakness. Psychiatric Not Present- Anxiety, Bipolar, Change in Sleep Pattern, Depression, Fearful and Frequent crying.  Vitals (Lisa Caldwell RMA; 11/29/2019 2:40 PM) 11/29/2019 2:39 PM Weight: 220.13 lb Height: 66in Body Surface Area: 2.08 m Body Mass Index: 35.53   kg/m  Temp.: 97.33F  Pulse: 87 (Regular)  P.OX: 98% (Room air) BP: 138/78(Sitting, Left Arm, Standard)        Physical Exam Rodman Key K. Anelly Samarin MD; 11/29/2019 3:26 PM)  The physical exam findings are as follows: Note:Well-developed well-nourished in no apparent distress Right axilla shows a 3 x 2 cm area of thickening. There is no gross purulence this time. This seems to be healing up after the most recent flareup. She is mildly tender palpation. No overlying erythema or induration. The left axilla is currently negative.    Assessment & Plan Imogene Burn. Liam Bossman MD; 11/29/2019 3:26 PM)  HIDRADENITIS AXILLARIS (L73.2)  Current Plans Schedule for Surgery - Excision of right axillary hidradenitis. The surgical procedure has been discussed with the patient. Potential risks, benefits, alternative treatments, and expected outcomes have been explained. All of the patient's questions at this time have been answered. The likelihood of reaching the patient's treatment goal is good. The patient understand the proposed surgical procedure and wishes to proceed. Note:We will perform a wide excision of all of the thickened area. We may be able to close is primarily if there is no gross purulence at the time of surgery.  Imogene Burn. Georgette Dover, MD, [facility]  General/ Trauma Surgery   11/29/2019 3:28 PM

## 2019-11-29 NOTE — H&P (View-Only) (Signed)
History of Present Illness [name] [name]. [name] MD; 11/29/2019 3:26 PM) The patient is a 59 year old female who presents with a complaint of Hidradenitis. PCP - Dr. Fara Olden Reason: Right axillary hidradenitis  This is a 59 year old female with a long history of axillary hidradenitis. She was evaluated 4 years ago for tenderness in her right axilla. However at that time, there was nothing that needed be addressed surgically. The patient states that she has frequent flareups that spontaneously burst and then drain purulent fluid. Subsequently things heal up for a few weeks and then flares up again. She presents now to discuss a more definitive surgical intervention.     Problem List/Past Medical [name], MD; 11/29/2019 3:26 PM) INCONTINENCE OF FECES, UNSPECIFIED FECAL INCONTINENCE TYPE (R15.9) HIDRADENITIS AXILLARIS (L73.2)  Past Surgical History (Saman Umstead K. Dayanara Sherrill, MD; 11/29/2019 3:26 PM) Breast Biopsy Bilateral. Colon Polyp Removal - Colonoscopy Hysterectomy (not due to cancer) - Complete Tonsillectomy Foot Surgery Right. Hysterectomy (not due to cancer) - Partial  Diagnostic Studies History [name], [place]; 11/29/2019 2:36 PM) Mammogram within last year >3 years ago Pap Smear >5 years ago  Allergies [name], RMA; 11/29/2019 2:36 PM) No Known Drug Allergies [02/21/2016]: Allergies Reconciled  Medication History [name], [place]; 11/29/2019 2:39 PM) Accu-Chek Softclix Lancets Active. Accu-Chek Aviva Plus (w/Device Kit,) Active. Atorvastatin Calcium (80MG Tablet, Oral) Active. MetFORMIN HCl (1000MG Tablet, Oral) Active. Metoprolol Tartrate (25MG Tablet, Oral) Active. Mucinex (600MG Tablet ER 12HR, Oral) Active. Victoza (18MG/3ML Soln Pen-inj, Subcutaneous) Active. Repatha Pushtronex System (420MG/3.5ML Soln Cartridge, Subcutaneous) Active. Potassium Citrate ER (15 MEQ(1620 MG) Tablet ER, Oral) Active. Medications  Reconciled Brilinta (90MG Tablet, Oral) Active. Clopidogrel Bisulfate (75MG Tablet, Oral) Active. Invokana (300MG Tablet, Oral) Active. Isosorbide Mononitrate ER (30MG Tablet ER 24HR, Oral) Active. Januvia (100MG Tablet, Oral) Active. Metoprolol Tartrate (37.5MG Tablet, Oral) Active. Simvastatin (40MG Tablet, Oral) Active. Nitroglycerin (0.4MG Tab Sublingual, Sublingual as needed) Active. Accu-Chek Aviva (In Vitro) Active. Accu-Chek Aviva Plus (In Vitro) Active. Vitamin D3 (50000UNIT Capsule, Oral) Active. Amoxicillin (500MG Capsule, Oral) Active.  Social History [name] [name]. [name], MD; 11/29/2019 3:26 PM) Alcohol use Occasional alcohol use. Caffeine use Carbonated beverages, Tea. No drug use Tobacco use Former smoker. No alcohol use  Family History [name], [place]; 11/29/2019 2:36 PM) Arthritis Mother. Breast Cancer Mother. Diabetes Mellitus Brother, Mother, Son. Hypertension Daughter, Mother. Kidney Disease Father. Respiratory Condition Son. Thyroid problems Mother.  Pregnancy / Birth History [name] [name]. [name], MD; 11/29/2019 3:26 PM) Age at menarche 16 years. Contraceptive History Oral contraceptives. Gravida 4 2 Length (months) of breastfeeding 3-6 Maternal age 70-20 53-25 54 2 Age of menopause <45  Other Problems [name] [name]. [name], MD; 11/29/2019 3:26 PM) Arthritis Cerebrovascular Accident Diabetes Mellitus Gastroesophageal Reflux Disease General anesthesia - complications High blood pressure Hypercholesterolemia Kidney Stone Myocardial infarction Sleep Apnea Congestive Heart Failure Depression     Review of Systems [name] RMA; 11/29/2019 2:36 PM) General Present- Night Sweats. Not Present- Appetite Loss, Chills, Fatigue, Fever, Weight Gain and Weight Loss. Skin Not Present- Change in Wart/Mole, Dryness, Hives, Jaundice, New Lesions, Non-Healing Wounds, Rash and Ulcer. Female Genitourinary Present-  Frequency. Not Present- Nocturia, Painful Urination, Pelvic Pain and Urgency. Neurological Not Present- Decreased Memory, Fainting, Headaches, Numbness, Seizures, Tingling, Tremor, Trouble walking and Weakness. Psychiatric Not Present- Anxiety, Bipolar, Change in Sleep Pattern, Depression, Fearful and Frequent crying.  Vitals [name] RMA; 11/29/2019 2:40 PM) 11/29/2019 2:39 PM Weight: 220.13 lb Height: 66in Body Surface Area: 2.08 m Body Mass Index: 35.53  kg/m  Temp.: 97.33F  Pulse: 87 (Regular)  P.OX: 98% (Room air) BP: 138/78(Sitting, Left Arm, Standard)        Physical Exam [name] MD; 11/29/2019 3:26 PM)  The physical exam findings are as follows: Note:Well-developed well-nourished in no apparent distress Right axilla shows a 3 x 2 cm area of thickening. There is no gross purulence this time. This seems to be healing up after the most recent flareup. She is mildly tender palpation. No overlying erythema or induration. The left axilla is currently negative.    Assessment & Plan [name] [name]. Tsuei MD; 11/29/2019 3:26 PM)  HIDRADENITIS AXILLARIS (L73.2)  Current Plans Schedule for Surgery - Excision of right axillary hidradenitis. The surgical procedure has been discussed with the patient. Potential risks, benefits, alternative treatments, and expected outcomes have been explained. All of the patient's questions at this time have been answered. The likelihood of reaching the patient's treatment goal is good. The patient understand the proposed surgical procedure and wishes to proceed. Note:We will perform a wide excision of all of the thickened area. We may be able to close is primarily if there is no gross purulence at the time of surgery.  [name] [name]. Georgette Dover, MD, [facility]  General/ Trauma Surgery   11/29/2019 3:28 PM

## 2019-11-30 ENCOUNTER — Telehealth: Payer: Self-pay | Admitting: Cardiology

## 2019-11-30 DIAGNOSIS — E119 Type 2 diabetes mellitus without complications: Secondary | ICD-10-CM | POA: Diagnosis not present

## 2019-11-30 NOTE — Telephone Encounter (Signed)
° °  [facility] Pre-operative Risk Assessment    Request for surgical clearance:  1. What type of surgery is being performed? Excision of right axillary hidradenitis   2. When is this surgery scheduled? TBD   3. What type of clearance is required (medical clearance vs. Pharmacy clearance to hold med vs. Both)? Both  4. Are there any medications that need to be held prior to surgery and how long? None specified - on ASA   5. Practice name and name of physician performing surgery? Donnie Mesa MD with [facility]    6. What is the office phone number? [phone]    7.   What is the office fax number? [phone] (Att: Jacqueline Haggett RMA)  8.   Anesthesia type (None, local, MAC, general) ? Modified anesthesia    Fidel Levy 11/30/2019, 3:56 PM  _________________________________________________________________   (provider comments below)

## 2019-12-02 NOTE — Telephone Encounter (Signed)
Yes ok to hold ASA for procedure  [name] MD, [facility]

## 2019-12-02 NOTE — Telephone Encounter (Signed)
   Primary Cardiologist: [name], MD  Chart reviewed as part of pre-operative protocol coverage. Patient was contacted 12/02/2019 in reference to pre-operative risk assessment for pending surgery as outlined below.  ASHRITA CHRISMER was last seen on 10/03/19 by Dr. Sharene Butters.  Since that day, [name] [name] has done well.  She is pending scheduling for excision of right axillary hidradenitis with Dr. Georgette Dover of [facility] using modified anesthesia. SHe has hx of CAD s/p SVG-OM & SVG-RCA s/p PCI to distal RCA lesion with Promus Premier stent in 2017.  Per her primary cardiologist she may hold her Aspirin prior to procedure at direction of her surgeon.   Therefore, based on ACC/AHA guidelines, the patient would be at acceptable risk for the planned procedure without further cardiovascular testing.   The patient was advised that if she develops new symptoms prior to surgery to contact our office to arrange for a follow-up visit, and she verbalized understanding.  I will route this recommendation to the requesting party via Epic fax function and remove from pre-op pool. Please call with questions.  Loel Dubonnet, NP 12/02/2019, 9:37 AM

## 2019-12-02 NOTE — Telephone Encounter (Signed)
LMTCB

## 2019-12-09 NOTE — Progress Notes (Signed)
[id number] [address], [place] [address] [place] [address] Phone: [phone] Fax: [phone]  [facility] - [address] [place] [address] Phone: [phone] Fax: [phone]      Your procedure is scheduled on Tuesday, October 5th.  Report to [facility] Main Entrance "A" at 9:15 A.M., and check in at the Admitting office.  Call this number if you have problems the morning of surgery:  [phone]  Call [phone] if you have any questions prior to your surgery date Monday-Friday 8am-4pm    Remember:  Do not eat after midnight the night before your surgery  You may drink clear liquids until 8:15 AM the morning of your surgery.   Clear liquids allowed are: Water, Non-Citrus Juices (without pulp), Carbonated Beverages, Clear Tea, Black Coffee Only, and Gatorade    Take these medicines the morning of surgery with A SIP OF WATER   Tylenol - if needed  Atorvastatin (Lipitor)  Zyrtec - if needed  Isosorbide Mononitrate (Imdur)  Metoprolol  Nitroglycerin tablet - if needed   Follow your surgeon's instructions on when to stop Aspirin.  If no instructions were given by your surgeon then you will need to call the office to get those instructions.    As of today, STOP taking Aleve, Naproxen, Ibuprofen, Motrin, Advil, Goody's, BC's, all herbal medications, fish oil, and all vitamins.   WHAT DO I DO ABOUT MY DIABETES MEDICATION?   Marland Kitchen Do not take oral diabetes medicines (pills) the morning of surgery. - Metformin  . THE NIGHT BEFORE SURGERY, do NOT take Victoza       . The day of surgery, do not take other diabetes injectables, including Victoza (liraglutide)    HOW TO MANAGE YOUR DIABETES BEFORE AND AFTER SURGERY  Why is it important to control my blood sugar before and after surgery? . Improving blood sugar levels before and after surgery helps healing and can limit  problems. . A way of improving blood sugar control is eating a healthy diet by: o  Eating less sugar and carbohydrates o  Increasing activity/exercise o  Talking with your doctor about reaching your blood sugar goals . High blood sugars (greater than 180 mg/dL) can raise your risk of infections and slow your recovery, so you will need to focus on controlling your diabetes during the weeks before surgery. . Make sure that the doctor who takes care of your diabetes knows about your planned surgery including the date and location.  How do I manage my blood sugar before surgery? . Check your blood sugar at least 4 times a day, starting 2 days before surgery, to make sure that the level is not too high or low. . Check your blood sugar the morning of your surgery when you wake up and every 2 hours until you get to the Short Stay unit. o If your blood sugar is less than 70 mg/dL, you will need to treat for low blood sugar: - Do not take insulin. - Treat a low blood sugar (less than 70 mg/dL) with  cup of clear juice (cranberry or apple), 4 glucose tablets, OR glucose gel. - Recheck blood sugar in 15 minutes after treatment (to make sure it is greater than 70 mg/dL). If your blood sugar is not greater than 70 mg/dL on recheck, call [phone] for further instructions. . Report your blood sugar to the short stay nurse when you get to  Short Stay.  . If you are admitted to the hospital after surgery: o Your blood sugar will be checked by the staff and you will probably be given insulin after surgery (instead of oral diabetes medicines) to make sure you have good blood sugar levels. o The goal for blood sugar control after surgery is 80-180 mg/dL.               DAY OF SURGERY:                   Do not wear jewelry, make up, or nail polish            Do not wear lotions, powders, perfumes, or deodorant.            Do not shave 48 hours prior to surgery.              Do not bring valuables to the  hospital.            [facility] is not responsible for any belongings or valuables.  Do NOT Smoke (Tobacco/Vaping) or drink Alcohol 24 hours prior to your procedure If you use a CPAP at night, you may bring all equipment for your overnight stay.   Contacts, glasses, dentures or bridgework may not be worn into surgery.      For patients admitted to the hospital, discharge time will be determined by your treatment team.   Patients discharged the day of surgery will not be allowed to drive home, and someone needs to stay with them for 24 hours.    Special instructions:   [place]- Preparing For Surgery  Before surgery, you can play an important role. Because skin is not sterile, your skin needs to be as free of germs as possible. You can reduce the number of germs on your skin by washing with CHG (chlorahexidine gluconate) Soap before surgery.  CHG is an antiseptic cleaner which kills germs and bonds with the skin to continue killing germs even after washing.    Oral Hygiene is also important to reduce your risk of infection.  Remember - BRUSH YOUR TEETH THE MORNING OF SURGERY WITH YOUR REGULAR TOOTHPASTE  Please do not use if you have an allergy to CHG or antibacterial soaps. If your skin becomes reddened/irritated stop using the CHG.  Do not shave (including legs and underarms) for at least 48 hours prior to first CHG shower. It is OK to shave your face.  Please follow these instructions carefully.   1. Shower the NIGHT BEFORE SURGERY and the MORNING OF SURGERY with CHG Soap.   2. If you chose to wash your hair, wash your hair first as usual with your normal shampoo.  3. After you shampoo, rinse your hair and body thoroughly to remove the shampoo.  4. Use CHG as you would any other liquid soap. You can apply CHG directly to the skin and wash gently with a scrungie or a clean washcloth.   5. Apply the CHG Soap to your body ONLY FROM THE NECK DOWN.  Do not use on open wounds or  open sores. Avoid contact with your eyes, ears, mouth and genitals (private parts). Wash Face and genitals (private parts)  with your normal soap.   6. Wash thoroughly, paying special attention to the area where your surgery will be performed.  7. Thoroughly rinse your body with warm water from the neck down.  8. DO NOT shower/wash with your normal soap after using and  rinsing off the CHG Soap.  9. Pat yourself dry with a CLEAN TOWEL.  10. Wear CLEAN PAJAMAS to bed the night before surgery  11. Place CLEAN SHEETS on your bed the night of your first shower and DO NOT SLEEP WITH PETS.   Day of Surgery: Wear Clean/Comfortable clothing the morning of surgery Do not apply any deodorants/lotions.   Remember to brush your teeth WITH YOUR REGULAR TOOTHPASTE.   Please read over the following fact sheets that you were given.

## 2019-12-12 ENCOUNTER — Encounter (HOSPITAL_COMMUNITY)
Admission: RE | Admit: 2019-12-12 | Discharge: 2019-12-12 | Disposition: A | Discharge status: Home / Self Care | Payer: Medicare HMO | Source: Ambulatory Visit | Attending: Surgery | Admitting: Surgery

## 2019-12-12 ENCOUNTER — Encounter (HOSPITAL_COMMUNITY): Payer: Self-pay

## 2019-12-12 ENCOUNTER — Other Ambulatory Visit: Payer: Self-pay

## 2019-12-12 DIAGNOSIS — Z01812 Encounter for preprocedural laboratory examination: Secondary | ICD-10-CM | POA: Insufficient documentation

## 2019-12-12 DIAGNOSIS — E119 Type 2 diabetes mellitus without complications: Secondary | ICD-10-CM | POA: Diagnosis not present

## 2019-12-12 HISTORY — DX: Personal history of urinary calculi: Z87.442

## 2019-12-12 HISTORY — DX: Sleep apnea, unspecified: G47.30

## 2019-12-12 LAB — GLUCOSE, CAPILLARY: Glucose-Capillary: 98 mg/dL (ref 70–99)

## 2019-12-12 LAB — BASIC METABOLIC PANEL
Anion gap: 11 (ref 5–15)
BUN: 8 mg/dL (ref 8–23)
CO2: 27 mmol/L (ref 22–32)
Calcium: 10.2 mg/dL (ref 8.9–10.3)
Chloride: 106 mmol/L (ref 98–111)
Creatinine, Ser: 0.95 mg/dL (ref 0.44–1.00)
GFR calc Af Amer: >60 mL/min (ref >60)
GFR calc non Af Amer: >60 mL/min (ref >60)
Glucose, Bld: 98 mg/dL (ref 70–99)
Potassium: 4.5 mmol/L (ref 3.5–5.1)
Sodium: 144 mmol/L (ref 135–145)

## 2019-12-12 LAB — CBC
HCT: 51.5 % — ABNORMAL HIGH (ref 36.0–46.0)
Hemoglobin: 15.9 g/dL — ABNORMAL HIGH (ref 12.0–15.0)
MCH: 28.4 pg (ref 26.0–34.0)
MCHC: 30.9 g/dL (ref 30.0–36.0)
MCV: 92 fL (ref 80.0–100.0)
Platelets: 296 10*3/uL (ref 150–400)
RBC: 5.6 MIL/uL — ABNORMAL HIGH (ref 3.87–5.11)
RDW: 15.1 % (ref 11.5–15.5)
WBC: 7.6 10*3/uL (ref 4.0–10.5)
nRBC: 0 % (ref 0.0–0.2)

## 2019-12-12 LAB — HEMOGLOBIN A1C
Hgb A1c MFr Bld: 7 % — ABNORMAL HIGH (ref 4.8–5.6)
Mean Plasma Glucose: 154.2 mg/dL

## 2019-12-12 NOTE — Progress Notes (Addendum)
PCP - Dr. Drema Halon @ [facility] Cardiologist - Dr [name]  PPM/ICD - n/a Device Orders -  Rep Notified -   Chest x-ray -  EKG - 01/03/2019 Stress Test - patient denies ECHO - 2018 Cardiac Cath - 2017  Sleep Study - patient states she did a home sleep study a few years ago; does have mild OSA - requested sleep study results from PCP CPAP - no  Fasting Blood Sugar - 100-120 Checks Blood Sugar 2-3 times a week  Blood Thinner Instructions: n/a Aspirin Instructions: per Dr. Martinique ok to hold, patient states she will look at the information given to her from Dr. Vonna Kotyk office  ERAS Protcol - clears until 0815 the morning of surgery PRE-SURGERY Ensure or G2- no drink ordered  COVID TEST- scheduled for 12/16/2019   Anesthesia review: yes, history of CAD and cardiac testing, abnormal EKG  Patient denies shortness of breath, fever, cough and chest pain at PAT appointment.  Patient's BP was elevated upon arrival to PAT appointment.  Patient stated she is compliant with medications and her BP is not normally as high but has a lot of stress currently.  Patient left PAT before her BP could be rechecked.  All instructions explained to the patient, with a verbal understanding of the material. Patient agrees to go over the instructions while at home for a better understanding. Patient also instructed to self quarantine after being tested for COVID-19. The opportunity to ask questions was provided.

## 2019-12-13 NOTE — Anesthesia Preprocedure Evaluation (Addendum)
Anesthesia Evaluation  Patient identified by MRN, date of birth, ID band Patient awake    Reviewed: Allergy & Precautions, NPO status , Patient's Chart, lab work & pertinent test results  History of Anesthesia Complications (+) PONV  Airway Mallampati: I  TM Distance: >3 FB Neck ROM: Full    Dental  (+) Teeth Intact, Dental Advisory Given   Pulmonary sleep apnea , former smoker,    breath sounds clear to auscultation       Cardiovascular hypertension, + CAD, + Cardiac Stents, + CABG and +CHF   Rhythm:Regular Rate:Normal     Neuro/Psych PSYCHIATRIC DISORDERS Anxiety Depression CVA    GI/Hepatic negative GI ROS, Neg liver ROS,   Endo/Other  diabetes, Type 2, Oral Hypoglycemic Agents  Renal/GU Renal disease     Musculoskeletal  (+) Arthritis , Rheumatoid disorders,    Abdominal Normal abdominal exam  (+)   Peds  Hematology   Anesthesia Other Findings   Reproductive/Obstetrics                           Anesthesia Physical Anesthesia Plan  ASA: III  Anesthesia Plan: General   Post-op Pain Management:    Induction: Intravenous  PONV Risk Score and Plan: 4 or greater and Ondansetron, Dexamethasone, Midazolam and Scopolamine patch - Pre-op  Airway Management Planned: LMA  Additional Equipment: None  Intra-op Plan:   Post-operative Plan: Extubation in OR  Informed Consent: I have reviewed the patients History and Physical, chart, labs and discussed the procedure including the risks, benefits and alternatives for the proposed anesthesia with the patient or authorized representative who has indicated his/her understanding and acceptance.       Plan Discussed with: CRNA  Anesthesia Plan Comments: (PAT note by Karoline Caldwell, [name]: Follows with cardiology for history of hyperlipidemia, hypertension, CAD s/p CABG 06/2015 with early vein graft failure. STEMI 10/04/15 with early loss of SVG  to OM & SVG to RCA  s/p PCI to distal RCA lesion with a  Promus Premier stent. Patent LIMA to LAD.  Last seen by Dr. Martinique 10/03/2019 and she was doing well at that time and denied any chest pain or SOB.  Cardiac clearance per telephone encounter 12/02/2019, "Chart reviewed as part of pre-operative protocol coverage. Patient was contacted 12/02/2019 in reference to pre-operative risk assessment for pending surgery as outlined below.  [name] [name] was last seen on 10/03/19 by Dr. Sharene Butters.  Since that day, [name] [name] has done well. She is pending scheduling for excision of right axillary hidradenitis with Dr. Georgette Dover of [facility] using modified anesthesia. SHe has hx of CAD s/p SVG-OM & SVG-RCA s/p PCI to distal RCA lesion with Promus Premier stent in 2017. Per her primary cardiologist she may hold her Aspirin prior to procedure at direction of her surgeon. Therefore, based on ACC/AHA guidelines, the patient would be at acceptable risk for the planned procedure without further cardiovascular testing."  Mild OSA not on CPAP.  Preop labs reviewed, unremarkable.  DM2 reasonably well-controlled with A1c 7.0.  EKG 01/03/2019: Normal sinus rhythm.  Rate 80.  Possible Left atrial enlargement. Incomplete right bundle branch block. Inferior ischemia. Lateral ischemia.  Of note, inferior and lateral lead T wave abnormalities also evident on 11/12/2017 EKG.  TTE 03/03/2017: - Left ventricle: The cavity size was normal. Wall thickness was  increased in a pattern of mild LVH. Systolic function was mildly  reduced. The estimated ejection fraction was  in the range of 45%  to 50%. Severe inferior hypokinesis to akinesis. Doppler  parameters are consistent with abnormal left ventricular  relaxation (grade 1 diastolic dysfunction). The E/e&' ratio is  between 8-15, suggesting indeterminate LV filling pressure.  - Aortic valve: Trileaflet. Sclerosis without stenosis. There was  no  regurgitation.  - Mitral valve: Mildly thickened leaflets . There was trivial  regurgitation.  - Left atrium: The atrium was normal in size.  - Inferior vena cava: The vessel was normal in size. The  respirophasic diameter changes were in the normal range (>= 50%),  consistent with normal central venous pressure.   Impressions:   - Compared to a prior study in 09/2015, the LVEF is lower at 45-50%  - global hypokinesis with inferior wall hypokinesis is noted.   Cath and PCI 10/04/2015: Prox LAD lesion, 85% stenosed. Patent LIMA to LAD. Severe native 3 vessel CAD. Lat 1st Mrg-2 lesion, 100% stenosed. Occluded SVG to OM. Ost RCA to Mid RCA lesion, 70% stenosed. Ocluded SVG to RCA, Dist RCA lesion, 99% stenosed. Post intervention with a 2.25 x 12 Promus Premier, there is a 0% residual stenosis. Normal LVEDP.   Switch to Brilinta from Plavix.  She needs aggressive secondary prevention.  Patient with residual ST elevation in the inferior leads but her chest pain has improved significantly.  Proximal RCA disease did not seem critical, compared to distal lesion.   )      Anesthesia Quick Evaluation

## 2019-12-13 NOTE — Progress Notes (Addendum)
Anesthesia Chart Review:  Follows with cardiology for history of hyperlipidemia, hypertension, CAD s/p CABG 06/2015 with early vein graft failure. STEMI 10/04/15 with early loss of SVG to OM & SVG to RCA  s/p PCI to distal RCA lesion with a  Promus Premier stent. Patent LIMA to LAD.  Last seen by Dr. Martinique 10/03/2019 and she was doing well at that time and denied any chest pain or SOB.  Cardiac clearance per telephone encounter 12/02/2019, "Chart reviewed as part of pre-operative protocol coverage. Patient was contacted 12/02/2019 in reference to pre-operative risk assessment for pending surgery as outlined below.  [name] [name] was last seen on 10/03/19 by Dr. Sharene Butters.  Since that day, [name] [name] has done well. She is pending scheduling for excision of right axillary hidradenitis with Dr. Georgette Dover of [facility] using modified anesthesia. SHe has hx of CAD s/p SVG-OM & SVG-RCA s/p PCI to distal RCA lesion with Promus Premier stent in 2017. Per her primary cardiologist she may hold her Aspirin prior to procedure at direction of her surgeon. Therefore, based on ACC/AHA guidelines, the patient would be at acceptable risk for the planned procedure without further cardiovascular testing."  Mild OSA not on CPAP.  Preop labs reviewed, unremarkable.  DM2 reasonably well-controlled with A1c 7.0.  EKG 01/03/2019: Normal sinus rhythm.  Rate 80.  Possible Left atrial enlargement. Incomplete right bundle branch block. Inferior ischemia. Lateral ischemia.  Of note, inferior and lateral lead T wave abnormalities also evident on 11/12/2017 EKG.  TTE 03/03/2017: - Left ventricle: The cavity size was normal. Wall thickness was  increased in a pattern of mild LVH. Systolic function was mildly  reduced. The estimated ejection fraction was in the range of 45%  to 50%. Severe inferior hypokinesis to akinesis. Doppler  parameters are consistent with abnormal left ventricular  relaxation (grade 1  diastolic dysfunction). The E/e&' ratio is  between 8-15, suggesting indeterminate LV filling pressure.  - Aortic valve: Trileaflet. Sclerosis without stenosis. There was  no regurgitation.  - Mitral valve: Mildly thickened leaflets . There was trivial  regurgitation.  - Left atrium: The atrium was normal in size.  - Inferior vena cava: The vessel was normal in size. The  respirophasic diameter changes were in the normal range (>= 50%),  consistent with normal central venous pressure.   Impressions:   - Compared to a prior study in 09/2015, the LVEF is lower at 45-50%  - global hypokinesis with inferior wall hypokinesis is noted.   Cath and PCI 10/04/2015:  Prox LAD lesion, 85% stenosed. Patent LIMA to LAD.  Severe native 3 vessel CAD.  Lat 1st Mrg-2 lesion, 100% stenosed. Occluded SVG to OM.  Ost RCA to Mid RCA lesion, 70% stenosed. Ocluded SVG to RCA,  Dist RCA lesion, 99% stenosed. Post intervention with a 2.25 x 12 Promus Premier, there is a 0% residual stenosis.  Normal LVEDP.   Switch to Brilinta from Plavix.  She needs aggressive secondary prevention.  Patient with residual ST elevation in the inferior leads but her chest pain has improved significantly.  Proximal RCA disease did not seem critical, compared to distal lesion.    Wynonia Musty [facility] Short Stay Center/Anesthesiology Phone [phone] 12/13/2019 3:06 PM

## 2019-12-14 DIAGNOSIS — Z0000 Encounter for general adult medical examination without abnormal findings: Secondary | ICD-10-CM | POA: Diagnosis not present

## 2019-12-14 DIAGNOSIS — E1165 Type 2 diabetes mellitus with hyperglycemia: Secondary | ICD-10-CM | POA: Diagnosis not present

## 2019-12-14 DIAGNOSIS — I10 Essential (primary) hypertension: Secondary | ICD-10-CM | POA: Diagnosis not present

## 2019-12-14 DIAGNOSIS — Z1231 Encounter for screening mammogram for malignant neoplasm of breast: Secondary | ICD-10-CM | POA: Diagnosis not present

## 2019-12-14 DIAGNOSIS — Z1389 Encounter for screening for other disorder: Secondary | ICD-10-CM | POA: Diagnosis not present

## 2019-12-14 DIAGNOSIS — I2510 Atherosclerotic heart disease of native coronary artery without angina pectoris: Secondary | ICD-10-CM | POA: Diagnosis not present

## 2019-12-14 DIAGNOSIS — M059 Rheumatoid arthritis with rheumatoid factor, unspecified: Secondary | ICD-10-CM | POA: Diagnosis not present

## 2019-12-14 DIAGNOSIS — Z23 Encounter for immunization: Secondary | ICD-10-CM | POA: Diagnosis not present

## 2019-12-15 DIAGNOSIS — E1165 Type 2 diabetes mellitus with hyperglycemia: Secondary | ICD-10-CM | POA: Diagnosis not present

## 2019-12-15 DIAGNOSIS — F329 Major depressive disorder, single episode, unspecified: Secondary | ICD-10-CM | POA: Diagnosis not present

## 2019-12-15 DIAGNOSIS — M059 Rheumatoid arthritis with rheumatoid factor, unspecified: Secondary | ICD-10-CM | POA: Diagnosis not present

## 2019-12-15 DIAGNOSIS — I5033 Acute on chronic diastolic (congestive) heart failure: Secondary | ICD-10-CM | POA: Diagnosis not present

## 2019-12-15 DIAGNOSIS — I2102 ST elevation (STEMI) myocardial infarction involving left anterior descending coronary artery: Secondary | ICD-10-CM | POA: Diagnosis not present

## 2019-12-15 DIAGNOSIS — E785 Hyperlipidemia, unspecified: Secondary | ICD-10-CM | POA: Diagnosis not present

## 2019-12-15 DIAGNOSIS — E1121 Type 2 diabetes mellitus with diabetic nephropathy: Secondary | ICD-10-CM | POA: Diagnosis not present

## 2019-12-15 DIAGNOSIS — I2510 Atherosclerotic heart disease of native coronary artery without angina pectoris: Secondary | ICD-10-CM | POA: Diagnosis not present

## 2019-12-15 DIAGNOSIS — I10 Essential (primary) hypertension: Secondary | ICD-10-CM | POA: Diagnosis not present

## 2019-12-16 ENCOUNTER — Other Ambulatory Visit (HOSPITAL_COMMUNITY)
Admission: RE | Admit: 2019-12-16 | Discharge: 2019-12-16 | Disposition: A | Discharge status: Home / Self Care | Payer: Medicare HMO | Source: Ambulatory Visit | Attending: Surgery | Admitting: Surgery

## 2019-12-16 DIAGNOSIS — Z20822 Contact with and (suspected) exposure to covid-19: Secondary | ICD-10-CM | POA: Insufficient documentation

## 2019-12-16 DIAGNOSIS — Z01812 Encounter for preprocedural laboratory examination: Secondary | ICD-10-CM | POA: Diagnosis not present

## 2019-12-16 LAB — SARS CORONAVIRUS 2 (TAT 6-24 HRS): SARS Coronavirus 2: NEGATIVE

## 2019-12-20 ENCOUNTER — Encounter (HOSPITAL_COMMUNITY)
Admission: RE | Disposition: A | Discharge status: Home / Self Care | Payer: Self-pay | Source: Home / Self Care | Attending: Surgery

## 2019-12-20 ENCOUNTER — Ambulatory Visit (HOSPITAL_COMMUNITY)
Admission: RE | Admit: 2019-12-20 | Discharge: 2019-12-20 | Disposition: A | Discharge status: Home / Self Care | Payer: Medicare HMO | Attending: Surgery | Admitting: Surgery

## 2019-12-20 ENCOUNTER — Other Ambulatory Visit: Payer: Self-pay

## 2019-12-20 ENCOUNTER — Ambulatory Visit (HOSPITAL_COMMUNITY): Payer: Medicare HMO | Admitting: Certified Registered Nurse Anesthetist

## 2019-12-20 ENCOUNTER — Encounter (HOSPITAL_COMMUNITY): Payer: Self-pay | Admitting: Surgery

## 2019-12-20 ENCOUNTER — Ambulatory Visit (HOSPITAL_COMMUNITY): Payer: Medicare HMO | Admitting: Physician Assistant

## 2019-12-20 DIAGNOSIS — G4730 Sleep apnea, unspecified: Secondary | ICD-10-CM | POA: Diagnosis not present

## 2019-12-20 DIAGNOSIS — E119 Type 2 diabetes mellitus without complications: Secondary | ICD-10-CM | POA: Insufficient documentation

## 2019-12-20 DIAGNOSIS — Z8261 Family history of arthritis: Secondary | ICD-10-CM | POA: Insufficient documentation

## 2019-12-20 DIAGNOSIS — Z833 Family history of diabetes mellitus: Secondary | ICD-10-CM | POA: Insufficient documentation

## 2019-12-20 DIAGNOSIS — Z87891 Personal history of nicotine dependence: Secondary | ICD-10-CM | POA: Diagnosis not present

## 2019-12-20 DIAGNOSIS — I110 Hypertensive heart disease with heart failure: Secondary | ICD-10-CM | POA: Insufficient documentation

## 2019-12-20 DIAGNOSIS — L732 Hidradenitis suppurativa: Secondary | ICD-10-CM | POA: Insufficient documentation

## 2019-12-20 DIAGNOSIS — M1990 Unspecified osteoarthritis, unspecified site: Secondary | ICD-10-CM | POA: Insufficient documentation

## 2019-12-20 DIAGNOSIS — E7800 Pure hypercholesterolemia, unspecified: Secondary | ICD-10-CM | POA: Diagnosis not present

## 2019-12-20 DIAGNOSIS — I509 Heart failure, unspecified: Secondary | ICD-10-CM | POA: Insufficient documentation

## 2019-12-20 DIAGNOSIS — I252 Old myocardial infarction: Secondary | ICD-10-CM | POA: Insufficient documentation

## 2019-12-20 DIAGNOSIS — I5033 Acute on chronic diastolic (congestive) heart failure: Secondary | ICD-10-CM | POA: Diagnosis not present

## 2019-12-20 DIAGNOSIS — K219 Gastro-esophageal reflux disease without esophagitis: Secondary | ICD-10-CM | POA: Insufficient documentation

## 2019-12-20 DIAGNOSIS — Z8249 Family history of ischemic heart disease and other diseases of the circulatory system: Secondary | ICD-10-CM | POA: Diagnosis not present

## 2019-12-20 DIAGNOSIS — E785 Hyperlipidemia, unspecified: Secondary | ICD-10-CM | POA: Diagnosis not present

## 2019-12-20 HISTORY — PX: HYDRADENITIS EXCISION: SHX5243

## 2019-12-20 LAB — GLUCOSE, CAPILLARY
Glucose-Capillary: 130 mg/dL — ABNORMAL HIGH (ref 70–99)
Glucose-Capillary: 109 mg/dL — ABNORMAL HIGH (ref 70–99)
Glucose-Capillary: 85 mg/dL (ref 70–99)

## 2019-12-20 SURGERY — EXCISION, HIDRADENITIS, AXILLA
Anesthesia: General | Laterality: Right

## 2019-12-20 MED ORDER — MIDAZOLAM HCL 2 MG/2ML IJ SOLN
INTRAMUSCULAR | Status: AC
  Filled 2019-12-20: qty 2

## 2019-12-20 MED ORDER — MIDAZOLAM HCL 2 MG/2ML IJ SOLN
INTRAMUSCULAR | Status: DC | PRN
  Administered 2019-12-20: 2 mg via INTRAVENOUS

## 2019-12-20 MED ORDER — CHLORHEXIDINE GLUCONATE 0.12 % MT SOLN
OROMUCOSAL | Status: AC
  Administered 2019-12-20: 15 mL via OROMUCOSAL
  Filled 2019-12-20: qty 15

## 2019-12-20 MED ORDER — ROCURONIUM BROMIDE 10 MG/ML (PF) SYRINGE
PREFILLED_SYRINGE | INTRAVENOUS | Status: AC
  Filled 2019-12-20: qty 10

## 2019-12-20 MED ORDER — CHLORHEXIDINE GLUCONATE 0.12 % MT SOLN: 15.0000 mL | Freq: Once | OROMUCOSAL | Status: AC

## 2019-12-20 MED ORDER — 0.9 % SODIUM CHLORIDE (POUR BTL) OPTIME
TOPICAL | Status: DC | PRN
  Administered 2019-12-20: 1000 mL

## 2019-12-20 MED ORDER — SUCCINYLCHOLINE CHLORIDE 200 MG/10ML IV SOSY
PREFILLED_SYRINGE | INTRAVENOUS | Status: AC
  Filled 2019-12-20: qty 20

## 2019-12-20 MED ORDER — SCOPOLAMINE 1 MG/3DAYS TD PT72
MEDICATED_PATCH | TRANSDERMAL | Status: AC
  Administered 2019-12-20: 1.5 mg via TRANSDERMAL
  Filled 2019-12-20: qty 1

## 2019-12-20 MED ORDER — SCOPOLAMINE 1 MG/3DAYS TD PT72: 1.0000 | MEDICATED_PATCH | TRANSDERMAL | Status: DC

## 2019-12-20 MED ORDER — LACTATED RINGERS IV SOLN: INTRAVENOUS | Status: DC

## 2019-12-20 MED ORDER — PHENYLEPHRINE HCL (PRESSORS) 10 MG/ML IV SOLN
INTRAVENOUS | Status: DC | PRN
  Administered 2019-12-20: 80 ug via INTRAVENOUS

## 2019-12-20 MED ORDER — LIDOCAINE 2% (20 MG/ML) 5 ML SYRINGE
INTRAMUSCULAR | Status: DC | PRN
  Administered 2019-12-20: 40 mg via INTRAVENOUS

## 2019-12-20 MED ORDER — PROPOFOL 10 MG/ML IV BOLUS
INTRAVENOUS | Status: DC | PRN
  Administered 2019-12-20: 140 mg via INTRAVENOUS

## 2019-12-20 MED ORDER — CEFAZOLIN SODIUM-DEXTROSE 2-4 GM/100ML-% IV SOLN
2.0000 g | INTRAVENOUS | Status: AC
  Administered 2019-12-20: 2 g via INTRAVENOUS

## 2019-12-20 MED ORDER — ORAL CARE MOUTH RINSE: 15.0000 mL | Freq: Once | OROMUCOSAL | Status: AC

## 2019-12-20 MED ORDER — FENTANYL CITRATE (PF) 250 MCG/5ML IJ SOLN
INTRAMUSCULAR | Status: DC | PRN
Start: 2019-12-20 — End: 2019-12-20
  Administered 2019-12-20: 50 ug via INTRAVENOUS
  Administered 2019-12-20: 25 ug via INTRAVENOUS

## 2019-12-20 MED ORDER — CHLORHEXIDINE GLUCONATE CLOTH 2 % EX PADS: 6.0000 | MEDICATED_PAD | Freq: Once | CUTANEOUS | Status: DC

## 2019-12-20 MED ORDER — FENTANYL CITRATE (PF) 250 MCG/5ML IJ SOLN
INTRAMUSCULAR | Status: AC
  Filled 2019-12-20: qty 5

## 2019-12-20 MED ORDER — PROPOFOL 10 MG/ML IV BOLUS
INTRAVENOUS | Status: AC
  Filled 2019-12-20: qty 20

## 2019-12-20 MED ORDER — PHENYLEPHRINE 40 MCG/ML (10ML) SYRINGE FOR IV PUSH (FOR BLOOD PRESSURE SUPPORT)
PREFILLED_SYRINGE | INTRAVENOUS | Status: AC
  Filled 2019-12-20: qty 20

## 2019-12-20 MED ORDER — OXYCODONE HCL 5 MG PO TABS: 5.0000 mg | ORAL_TABLET | Freq: Four times a day (QID) | ORAL | 0 refills | Status: DC | PRN

## 2019-12-20 MED ORDER — DEXAMETHASONE SODIUM PHOSPHATE 10 MG/ML IJ SOLN
INTRAMUSCULAR | Status: DC | PRN
  Administered 2019-12-20: 5 mg via INTRAVENOUS

## 2019-12-20 MED ORDER — ONDANSETRON HCL 4 MG/2ML IJ SOLN
INTRAMUSCULAR | Status: DC | PRN
  Administered 2019-12-20: 4 mg via INTRAVENOUS

## 2019-12-20 MED ORDER — LIDOCAINE 2% (20 MG/ML) 5 ML SYRINGE
INTRAMUSCULAR | Status: AC
  Filled 2019-12-20: qty 20

## 2019-12-20 MED ORDER — CEFAZOLIN SODIUM-DEXTROSE 2-4 GM/100ML-% IV SOLN
INTRAVENOUS | Status: AC
  Filled 2019-12-20: qty 100

## 2019-12-20 MED ORDER — BUPIVACAINE HCL (PF) 0.25 % IJ SOLN
INTRAMUSCULAR | Status: DC | PRN
  Administered 2019-12-20: 30 mL

## 2019-12-20 SURGICAL SUPPLY — 43 items
APL PRP STRL LF DISP 70% ISPRP (MISCELLANEOUS) ×1
APPLIER CLIP 9.375 MED OPEN (MISCELLANEOUS) ×2
APR CLP MED 9.3 20 MLT OPN (MISCELLANEOUS) ×1
CANISTER SUCT 3000ML PPV (MISCELLANEOUS) ×2 IMPLANT
CHLORAPREP W/TINT 26 (MISCELLANEOUS) ×2 IMPLANT
CLIP APPLIE 9.375 MED OPEN (MISCELLANEOUS) ×1 IMPLANT
CLSR STERI-STRIP ANTIMIC 1/2X4 (GAUZE/BANDAGES/DRESSINGS) ×1 IMPLANT
CNTNR URN SCR LID CUP LEK RST (MISCELLANEOUS) IMPLANT
CONT SPEC 4OZ STRL OR WHT (MISCELLANEOUS)
COVER PROBE W GEL 5X96 (DRAPES) IMPLANT
COVER SURGICAL LIGHT HANDLE (MISCELLANEOUS) ×2 IMPLANT
COVER WAND RF STERILE (DRAPES) ×2 IMPLANT
DECANTER SPIKE VIAL GLASS SM (MISCELLANEOUS) ×2 IMPLANT
DRAIN CHANNEL 19F RND (DRAIN) ×4 IMPLANT
DRAPE LAPAROSCOPIC ABDOMINAL (DRAPES) ×2 IMPLANT
DRSG TEGADERM 4X4.75 (GAUZE/BANDAGES/DRESSINGS) ×1 IMPLANT
ELECT CAUTERY BLADE 6.4 (BLADE) IMPLANT
ELECT REM PT RETURN 9FT ADLT (ELECTROSURGICAL) ×2
ELECTRODE REM PT RTRN 9FT ADLT (ELECTROSURGICAL) ×1 IMPLANT
EVACUATOR SILICONE 100CC (DRAIN) ×4 IMPLANT
GAUZE SPONGE 4X4 12PLY STRL (GAUZE/BANDAGES/DRESSINGS) ×2 IMPLANT
GLOVE BIO SURGEON STRL SZ7 (GLOVE) ×2 IMPLANT
GLOVE BIOGEL PI IND STRL 7.5 (GLOVE) ×1 IMPLANT
GLOVE BIOGEL PI INDICATOR 7.5 (GLOVE) ×1
GOWN STRL REUS W/ TWL LRG LVL3 (GOWN DISPOSABLE) ×2 IMPLANT
GOWN STRL REUS W/TWL LRG LVL3 (GOWN DISPOSABLE) ×4
KIT BASIN OR (CUSTOM PROCEDURE TRAY) ×2 IMPLANT
KIT TURNOVER KIT B (KITS) ×2 IMPLANT
NDL 18GX1X1/2 (RX/OR ONLY) (NEEDLE) IMPLANT
NDL HYPO 25GX1X1/2 BEV (NEEDLE) IMPLANT
NEEDLE 18GX1X1/2 (RX/OR ONLY) (NEEDLE) IMPLANT
NEEDLE HYPO 25GX1X1/2 BEV (NEEDLE) IMPLANT
NS IRRIG 1000ML POUR BTL (IV SOLUTION) ×2 IMPLANT
PACK GENERAL/GYN (CUSTOM PROCEDURE TRAY) ×2 IMPLANT
PAD ARMBOARD 7.5X6 YLW CONV (MISCELLANEOUS) ×4 IMPLANT
PENCIL SMOKE EVACUATOR (MISCELLANEOUS) ×2 IMPLANT
SPECIMEN JAR LARGE (MISCELLANEOUS) ×2 IMPLANT
STAPLER VISISTAT 35W (STAPLE) ×2 IMPLANT
SUT ETHILON 3 0 FSL (SUTURE) ×4 IMPLANT
SUT VIC AB 3-0 SH 18 (SUTURE) ×2 IMPLANT
SYR CONTROL 10ML LL (SYRINGE) IMPLANT
TOWEL GREEN STERILE (TOWEL DISPOSABLE) ×2 IMPLANT
TOWEL GREEN STERILE FF (TOWEL DISPOSABLE) ×2 IMPLANT

## 2019-12-20 NOTE — Transfer of Care (Signed)
Immediate Anesthesia Transfer of Care Note  Patient: [name] [name]  Procedure(s) Performed: EXCISION OF RIGHT  HIDRADENITIS AXILLARY (Right )  Patient Location: PACU  Anesthesia Type:General  Level of Consciousness: awake, alert  and oriented  Airway & Oxygen Therapy: Patient Spontanous Breathing  Post-op Assessment: Report given to RN and Post -op Vital signs reviewed and stable  Post vital signs: Reviewed and stable  Last Vitals:  Vitals Value Taken Time  BP 143/97 12/20/19 1310  Temp 36.5 C 12/20/19 1310  Pulse 75 12/20/19 1311  Resp 20 12/20/19 1311  SpO2 96 % 12/20/19 1311  Vitals shown include unvalidated device data.  Last Pain:  Vitals:   12/20/19 1310  PainSc: 0-No pain      Patients Stated Pain Goal: 4 (40/00/50 5678)  Complications: No complications documented.

## 2019-12-20 NOTE — Discharge Instructions (Signed)
[place] Office Phone Number [phone]   POST OP INSTRUCTIONS  Always review your discharge instruction sheet given to you by the facility where your surgery was performed.  IF YOU HAVE DISABILITY OR FAMILY LEAVE FORMS, YOU MUST BRING THEM TO THE OFFICE FOR PROCESSING.  DO NOT GIVE THEM TO YOUR DOCTOR.  1. A prescription for pain medication may be given to you upon discharge.  Take your pain medication as prescribed, if needed.  If narcotic pain medicine is not needed, then you may take acetaminophen (Tylenol) or ibuprofen (Advil) as needed. 2. Take your usually prescribed medications unless otherwise directed 3. If you need a refill on your pain medication, please contact your pharmacy.  They will contact our office to request authorization.  Prescriptions will not be filled after 5pm or on week-ends. 4. You should eat very light the first 24 hours after surgery, such as soup, crackers, pudding, etc.  Resume your normal diet the day after surgery. 5. Most patients will experience some swelling and bruising around the surgical site.  Ice packs will help.  Swelling and bruising can take several days to resolve.  6. It is common to experience some constipation if taking pain medication after surgery.  Increasing fluid intake and taking a stool softener will usually help or prevent this problem from occurring.  A mild laxative (Milk of Magnesia or Miralax) should be taken according to package directions if there are no bowel movements after 48 hours. 7. You may remove your bandages 48 hours after surgery, and you may shower at that time.  You will have steri-strips (small skin tapes) in place directly over the incision.  These strips should be left on the skin for 7-10 days.   8. ACTIVITIES:  You may resume regular daily activities (gradually increasing) beginning the next day.   You may have sexual intercourse when it is comfortable. a. You may drive when you no longer are taking  prescription pain medication, you can comfortably wear a seatbelt, and you can safely maneuver your car and apply brakes. b. RETURN TO WORK:  1-2 weeks 9. You should see your doctor in the office for a follow-up appointment approximately two to three weeks after your surgery.    WHEN TO CALL YOUR DOCTOR: 1. Fever over 101.0 2. Nausea and/or vomiting. 3. Extreme swelling or bruising. 4. Continued bleeding from incision. 5. Increased pain, redness, or drainage from the incision.  The clinic staff is available to answer your questions during regular business hours.  Please don't hesitate to call and ask to speak to one of the nurses for clinical concerns.  If you have a medical emergency, go to the nearest emergency room or call 911.  A surgeon from [facility] is always on call at the hospital.  For further questions, please visit [email or web address]

## 2019-12-20 NOTE — Anesthesia Procedure Notes (Signed)
Procedure Name: LMA Insertion Date/Time: 12/20/2019 12:17 PM Performed by: Clearnce Sorrel, CRNA Pre-anesthesia Checklist: Patient identified, Emergency Drugs available, Suction available, Patient being monitored and Timeout performed Patient Re-evaluated:Patient Re-evaluated prior to induction Oxygen Delivery Method: Circle system utilized Preoxygenation: Pre-oxygenation with 100% oxygen Induction Type: IV induction LMA: LMA inserted LMA Size: 4.0 Number of attempts: 1 Placement Confirmation: positive ETCO2 and breath sounds checked- equal and bilateral Tube secured with: Tape Dental Injury: Teeth and Oropharynx as per pre-operative assessment

## 2019-12-20 NOTE — Anesthesia Postprocedure Evaluation (Signed)
Anesthesia Post Note  Patient: [name] [name]  Procedure(s) Performed: EXCISION OF RIGHT  HIDRADENITIS AXILLARY (Right )     Patient location during evaluation: PACU Anesthesia Type: General Level of consciousness: awake and alert Pain management: pain level controlled Vital Signs Assessment: post-procedure vital signs reviewed and stable Respiratory status: spontaneous breathing, nonlabored ventilation, respiratory function stable and patient connected to nasal cannula oxygen Cardiovascular status: blood pressure returned to baseline and stable Postop Assessment: no apparent nausea or vomiting Anesthetic complications: no   No complications documented.  Last Vitals:  Vitals:   12/20/19 1325 12/20/19 1330  BP: (!) 156/93 (!) 162/98  Pulse: 74 69  Resp: 14 18  Temp:  36.5 C  SpO2: 95% 95%    Last Pain:  Vitals:   12/20/19 1330  PainSc: 0-No pain                 Effie Berkshire

## 2019-12-20 NOTE — Op Note (Signed)
Preop diagnosis: Recurrent hidradenitis right axilla Postop diagnosis: Same Procedure performed: Excision of right axillary hidradenitis Surgeon:Sujata Maines K Delmore Sear Resident Surgeon: Dr. Mikle Bosworth. I was personally present during the key and critical portions of this procedure and immediately available throughout the entire procedure, as documented in my operative note.  Anesthesia: General Indications: This is a 59 year old female with a long history of axillary hidradenitis.  Recently she has had multiple frequent flareups with swelling and spontaneous drainage.  She was examined in the office and was felt to be a candidate for excision of this area.  Description of procedure: The patient is brought to the operating room placed in supine position on operating room table.  After an adequate level of general anesthesia was obtained, the right axilla was shaved, prepped with ChloraPrep and draped in sterile fashion.  A timeout was taken to ensure the proper patient and proper procedure.  She has a 2.5 cm area of thickening as well as a 7 mm protruding area just below this.  We made an elliptical incision that encompassed all of this area.  First we infiltrated with local anesthetic.  We dissected down in the subcutaneous tissues with cautery.  We excised all of the firm scar tissue leaving only healthy adipose tissue.  We inspected carefully for hemostasis.  We irrigated thoroughly.  The wound was closed with a deep layer of 3-0 Vicryl in a subcuticular layer 4-0 Monocryl.  Benzoin and Steri-Strips were applied.  A sterile dressing was applied.  The patient was then extubated and brought to the recovery room in stable condition.  All sponge, instrument, and needle counts are correct.  Imogene Burn. Georgette Dover, MD, [facility]  General/ Trauma Surgery   12/20/2019 12:57 PM

## 2019-12-20 NOTE — Interval H&P Note (Signed)
History and Physical Interval Note:  12/20/2019 11:11 AM  [name] [name]  has presented today for surgery, with the diagnosis of RIGHT AXILLARY HIDRADENITIS.  The various methods of treatment have been discussed with the patient and family. After consideration of risks, benefits and other options for treatment, the patient has consented to  Procedure(s): EXCISION OF RIGHT  HIDRADENITIS AXILLARY (Right) as a surgical intervention.  The patient's history has been reviewed, patient examined, no change in status, stable for surgery.  I have reviewed the patient's chart and labs.  Questions were answered to the patient's satisfaction.     [name]

## 2019-12-21 ENCOUNTER — Encounter (HOSPITAL_COMMUNITY): Payer: Self-pay | Admitting: Surgery

## 2019-12-21 LAB — SURGICAL PATHOLOGY

## 2019-12-22 DIAGNOSIS — I10 Essential (primary) hypertension: Secondary | ICD-10-CM | POA: Diagnosis not present

## 2019-12-22 DIAGNOSIS — E1121 Type 2 diabetes mellitus with diabetic nephropathy: Secondary | ICD-10-CM | POA: Diagnosis not present

## 2019-12-22 DIAGNOSIS — E785 Hyperlipidemia, unspecified: Secondary | ICD-10-CM | POA: Diagnosis not present

## 2019-12-22 DIAGNOSIS — F329 Major depressive disorder, single episode, unspecified: Secondary | ICD-10-CM | POA: Diagnosis not present

## 2019-12-22 DIAGNOSIS — E1165 Type 2 diabetes mellitus with hyperglycemia: Secondary | ICD-10-CM | POA: Diagnosis not present

## 2019-12-27 ENCOUNTER — Ambulatory Visit: Payer: Medicare HMO

## 2020-02-29 ENCOUNTER — Other Ambulatory Visit: Payer: Self-pay

## 2020-02-29 ENCOUNTER — Ambulatory Visit
Admission: RE | Admit: 2020-02-29 | Discharge: 2020-02-29 | Disposition: A | Discharge status: Home / Self Care | Payer: Medicare HMO | Source: Ambulatory Visit | Attending: Internal Medicine | Admitting: Internal Medicine

## 2020-02-29 DIAGNOSIS — Z1231 Encounter for screening mammogram for malignant neoplasm of breast: Secondary | ICD-10-CM

## 2020-03-15 DIAGNOSIS — I10 Essential (primary) hypertension: Secondary | ICD-10-CM | POA: Diagnosis not present

## 2020-03-15 DIAGNOSIS — I2102 ST elevation (STEMI) myocardial infarction involving left anterior descending coronary artery: Secondary | ICD-10-CM | POA: Diagnosis not present

## 2020-03-15 DIAGNOSIS — M059 Rheumatoid arthritis with rheumatoid factor, unspecified: Secondary | ICD-10-CM | POA: Diagnosis not present

## 2020-03-15 DIAGNOSIS — K219 Gastro-esophageal reflux disease without esophagitis: Secondary | ICD-10-CM | POA: Diagnosis not present

## 2020-03-15 DIAGNOSIS — I2510 Atherosclerotic heart disease of native coronary artery without angina pectoris: Secondary | ICD-10-CM | POA: Diagnosis not present

## 2020-03-15 DIAGNOSIS — I5033 Acute on chronic diastolic (congestive) heart failure: Secondary | ICD-10-CM | POA: Diagnosis not present

## 2020-03-15 DIAGNOSIS — E1165 Type 2 diabetes mellitus with hyperglycemia: Secondary | ICD-10-CM | POA: Diagnosis not present

## 2020-03-15 DIAGNOSIS — E1121 Type 2 diabetes mellitus with diabetic nephropathy: Secondary | ICD-10-CM | POA: Diagnosis not present

## 2020-03-15 DIAGNOSIS — E785 Hyperlipidemia, unspecified: Secondary | ICD-10-CM | POA: Diagnosis not present

## 2020-04-03 NOTE — Progress Notes (Deleted)
Cardiology Office Note    Date:  04/03/2020   ID:  DEVORIA SCHEIER, DOB [birth year], MRN [id number]  PCP:  Leeroy Cha, MD  Cardiologist:  Dr. Martinique  Chief Complaint: follow up CAD  History of Present Illness:   [name] [name] is a 60 y.o. female with past medical history of CAD (s/p CABG on 06/19/2015 w/ LIMA-LAD, SVG-RCA, SVG-OM2), Lupus, Type 2 DM, HTN, and HLD who is seen for follow up CAD.   In April 2017 hospitalized with NSTEMI and cath with severe 3-vessel disease with 80% RCA stenosis, 85% Prox LAD stenosis, and 90% 1st Mrg with 85% 2nd Mrg. CABG was recommended and this was performed on 4/4 with the LIMA-LAD, SVG-RCA, and SVG-OM2. She was started on ASA and Plavix.  She presented 10/04/15 to [facility] with complained of chest discomfort for the past 3 weeks. Upon arrival to the ED, she was noted to have ST elevation in the inferior and lateral leads of her EKG. Emergently to cardiac catheterization revealed Patent LIMA to the LAD, occluded SVG to OM, occluded SVG to RCA.  Distal RCA 99% stenosed and she underwent emergent PCI with Promus DES to distal RCA. Her troponin peaked at 14. Plavix changed to [place]. Pt had persistent ST elevation inferiorly and tachycardia. Increased BB.Echo with EF 55-60% inferior hypokinesis and relatively hyperdynamic anterior wall motion.  Zocor changed to lipitor.   She was seen in December 2018 with atypical lateral chest pain, shoulder pain- worse with deep breathing. Troponin levels were normal. She had colonoscopy with polypectomy several days prior. There was mild edema noted on CXR and she was given IV lasix x1. Echo showed EF 45-50% that on my review was not significantly changed from prior. She was observed overnight and DC the next day.   She has been diagnosed with psoriatic arthritis. Followed by Rheumatology  This past fall she had recurrent UTIs and kidney stones. Had surgery but stone recurred. Followed by Dr Karsten Ro.   She was  seen in our lipid clinic in February. Recommended starting Repatha/ she is tolerating this well.   On follow up today she denies any chest pain or SOB. She works for [employer]. She is eating healthier and working with a nutritionist now. She is walking 3 days a week.   She is not smoking.    Past Medical History:  Diagnosis Date  . Acute ST elevation myocardial infarction (STEMI) involving right coronary artery ([place]) 10/04/2015  . Anxiety   . Anxiety and depression   . CAD (coronary artery disease)    a. CABG 06/2015: LIMA-LAD, SVG-RCA, SVG-OM2 b. STEMI s/p PCI to distal RCA lesion with a small Promus Premier stent. ( patent LIMA to the LAD, occluded SVG to OM, occluded SVG to RCA.)  . CAD (coronary artery disease) of artery bypass graft; occluded SVG to RCA and occluded SVG to OM 10/08/2015  . Empty sella ([place])   . Hidradenitis axillaris   . History of kidney stones   . Hyperlipemia   . Hypertension   . Kidney stones   . Lupus ([place]) dx'd 2008   "they think I have this; have to get retested" (06/12/2015)  . Obesity (BMI 30.0-34.9)   . Ovarian cyst   . Pneumonia X 2  . PONV (postoperative nausea and vomiting)   . Rheumatoid aortitis   . Rheumatoid arthritis ([place])   . S/P angioplasty with stent; 10/04/15 to distal RCA lesion. with Promus premier. 10/08/2015  . Scoliosis   .  Sleep apnea    patient states "it is mild"  . Stroke [facility]) March 2014   left MCA branches; "they say I have them all the time", denies residual on 06/12/2015  . Tobacco abuse   . Type II diabetes mellitus ([place])     Past Surgical History:  Procedure Laterality Date  . ABDOMINAL HYSTERECTOMY    . BREAST BIOPSY Bilateral   . CARDIAC CATHETERIZATION N/A 06/13/2015   Procedure: Left Heart Cath and Coronary Angiography;  Surgeon: Jaylanni Eltringham M Martinique, MD;  Location: [place] [facility];  Service: Cardiovascular;  Laterality: N/A;  . CARDIAC CATHETERIZATION N/A 10/04/2015   Procedure: Left Heart Cath and Coronary  Angiography;  Surgeon: [name], MD;  Location: [place] [facility];  Service: Cardiovascular;  Laterality: N/A;  . CARDIAC CATHETERIZATION N/A 10/04/2015   Procedure: Coronary Stent Intervention;  Surgeon: [name], MD;  Location: [place] [facility];  Service: Cardiovascular;  Laterality: N/A;  . CORONARY ARTERY BYPASS GRAFT N/A 06/19/2015   Procedure: CORONARY ARTERY BYPASS GRAFTING (CABG) TIMES FOUR USING LEFT INTERNAL MAMMARY, RIGHT SAPHENOUS LEG VEIN AND CRYO SAPHENOUS VEIN;  Surgeon: [name], MD;  Location: [place];  Service: Open Heart Surgery;  Laterality: N/A;  . DILATION AND CURETTAGE OF UTERUS    . HYDRADENITIS EXCISION Right 12/20/2019   Procedure: EXCISION OF RIGHT  HIDRADENITIS AXILLARY;  Surgeon: Donnie Mesa, MD;  Location: [place];  Service: General;  Laterality: Right;  . IR URETERAL STENT LEFT NEW ACCESS W/O SEP NEPHROSTOMY CATH  01/10/2019  . NEPHROLITHOTOMY Left 01/10/2019   Procedure: NEPHROLITHOTOMY PERCUTANEOUS;  Surgeon: Kathie Rhodes, MD;  Location: [facility];  Service: Urology;  Laterality: Left;  . TEE WITHOUT CARDIOVERSION N/A 06/19/2015   Procedure: TRANSESOPHAGEAL ECHOCARDIOGRAM (TEE);  Surgeon: [name], MD;  Location: [place];  Service: Open Heart Surgery;  Laterality: N/A;  . TONSILLECTOMY  1960s  . TUBAL LIGATION      Current Medications: Allergies as of 04/06/2020      Reactions   Other Nausea And Vomiting   Anesthesia      Medication List       Accurate as of April 03, 2020  4:38 PM. If you have any questions, ask your nurse or doctor.        acetaminophen 325 MG tablet Commonly known as: TYLENOL Take 2 tablets (650 mg total) by mouth every 4 (four) hours as needed for headache or mild pain.   aspirin 81 MG EC tablet Take 1 tablet (81 mg total) by mouth daily.   atorvastatin 80 MG tablet Commonly known as: LIPITOR TAKE 1 TABLET EVERY DAY  AT  6PM What changed:   how much to take  how to take this  when to take  this  additional instructions   cetirizine 10 MG tablet Commonly known as: ZYRTEC Take 10 mg by mouth daily.   cholecalciferol 25 MCG (1000 UNIT) tablet Commonly known as: VITAMIN D Take 1,000 Units by mouth in the morning and at bedtime.   famotidine 20 MG tablet Commonly known as: PEPCID Take 20 mg by mouth at bedtime as needed for heartburn or indigestion.   guaiFENesin 600 MG 12 hr tablet Commonly known as: MUCINEX Take 600 mg by mouth 2 (two) times daily as needed (congestion.).   HAIR/SKIN/NAILS PO Take 1-2 tablets by mouth See admin instructions. Take 1 tablet in the morning & take 2 tablets in the evening.   isosorbide mononitrate 30 MG 24 hr  tablet Commonly known as: IMDUR Take 0.5 tablets (15 mg total) by mouth daily.   liraglutide 18 MG/3ML Sopn Commonly known as: VICTOZA Inject 1.8 mg into the skin at bedtime.   metFORMIN 1000 MG tablet Commonly known as: GLUCOPHAGE Take 1,000 mg by mouth 2 (two) times daily.   metoprolol tartrate 25 MG tablet Commonly known as: LOPRESSOR Take one and a half (1.5) tablets (37.5 mg) by mouth twice (2) daily. What changed:   how much to take  how to take this  when to take this  additional instructions   multivitamin with minerals Tabs tablet Take 1 tablet by mouth daily. Centrum Silver   nitroGLYCERIN 0.4 MG SL tablet Commonly known as: NITROSTAT DISSOLVE 1 TABLET UNDER THE TONGUE EVERY 5 MINUTES AS  NEEDED FOR CHEST PAIN. MAX  OF 3 TABLETS IN 15 MINUTES. CALL 911 IF PAIN PERSISTS. What changed: See the new instructions.   oxyCODONE 5 MG immediate release tablet Commonly known as: Oxy IR/ROXICODONE Take 1 tablet (5 mg total) by mouth every 6 (six) hours as needed for severe pain.   Potassium Citrate 15 MEQ (1620 MG) Tbcr Take 1,620 mg by mouth in the morning and at bedtime.   Repatha Pushtronex System 420 MG/3.5ML Soct Generic drug: Evolocumab with Infusor Inject 420 mg into the skin every 30 (thirty)  days.       Allergies:   Other   Social History   Socioeconomic History  . Marital status: Widowed    Spouse name: Not on file  . Number of children: 2  . Years of education: college  . Highest education level: Not on file  Occupational History  . Occupation: Disability  Tobacco Use  . Smoking status: Former Smoker    Packs/day: 0.50    Years: 36.00    Pack years: 18.00    Types: Cigarettes    Quit date: 06/12/2014    Years since quitting: 5.8  . Smokeless tobacco: Never Used  Vaping Use  . Vaping Use: Never used  Substance and Sexual Activity  . Alcohol use: No    Alcohol/week: 0.0 standard drinks  . Drug use: No  . Sexual activity: Not Currently  Other Topics Concern  . Not on file  Social History Narrative   Patient currently lives at home with her disabled husband who was shot in 2009 and he is total care. She lives with her son and daughter and there is an 8 that comes in. Patient currently is on disability   She occasionally drinks caffeine.    Social Determinants of Health   Financial Resource Strain: Not on file  Food Insecurity: Not on file  Transportation Needs: Not on file  Physical Activity: Not on file  Stress: Not on file  Social Connections: Not on file     Family History:  The patient's family history includes Alzheimer's disease in an other family member; Breast cancer in her mother and another family member; Depression in her son; Diabetes in her mother and another family member; Heart failure in her mother; Stroke in her maternal aunt.   ROS:   Please see the history of present illness.    ROS All other systems reviewed and are negative.   PHYSICAL EXAM:   VS:  There were no vitals taken for this visit.   GENERAL:  Well appearing, overweight BF in NAD HEENT:  PERRL, EOMI, sclera are clear. Oropharynx is clear. NECK:  No jugular venous distention, carotid upstroke brisk and symmetric, no  bruits, no thyromegaly or adenopathy LUNGS:  Clear  to auscultation bilaterally CHEST:  Unremarkable HEART:  RRR,  PMI not displaced or sustained,S1 and S2 within normal limits, no S3, no S4: no clicks, no rubs, no murmurs ABD:  Soft, nontender. BS +, no masses or bruits. No hepatomegaly, no splenomegaly EXT:  2 + pulses throughout, no edema, no cyanosis no clubbing SKIN:  Warm and dry.  No rashes NEURO:  Alert and oriented x 3. Cranial nerves II through XII intact. PSYCH:  Cognitively intact      Wt Readings from Last 3 Encounters:  12/20/19 219 lb 9.3 oz (99.6 kg)  12/12/19 219 lb 8 oz (99.6 kg)  10/03/19 224 lb (101.6 kg)      Studies/Labs Reviewed:   EKG:  EKG is not  ordered today.     Recent Labs: 10/03/2019: ALT 21 12/12/2019: BUN 8; Creatinine, Ser 0.95; Hemoglobin 15.9; Platelets 296; Potassium 4.5; Sodium 144   Lipid Panel    Component Value Date/Time   CHOL 128 10/03/2019 1455   TRIG 211 (H) 10/03/2019 1455   HDL 63 10/03/2019 1455   CHOLHDL 2.0 10/03/2019 1455   CHOLHDL 2.9 10/05/2015 0305   VLDL 28 10/05/2015 0305   LDLCALC 32 10/03/2019 1455    Additional studies/ records that were reviewed today include:   Labs from primary care: 10/22/15: cholesterol 151, triglycerides 180, LDL 67, HDL 48. A1c 7.6%. CMET and CBC normal.  Dated 11/25/16: cholesterol 183, triglycerides 117, HDL 62, LDL 98. A1c 7.1%.  Dated 12/10/18: cholesterol 216, triglycerides 146, HDL 52, LDL 134. CMET normal Dated 01/03/19: A1c 7.3. CBC normal. Dated 08/19/19: A1c 7.8%.  Dated 12/14/19: A1c 7.1%  Echocardiogram: 10/05/15 LV EF: 55% -   60%  ------------------------------------------------------------------- Indications:      CAD of native vessels 414.01.  ------------------------------------------------------------------- History:   Risk factors:  Hypertension. Diabetes mellitus. Dyslipidemia.  ------------------------------------------------------------------- Study Conclusions  - Left ventricle: The cavity size was normal.  Wall thickness was   increased in a pattern of mild LVH. Systolic function was normal.   The estimated ejection fraction was in the range of 55% to 60%.   Inferior wall hypokinesis. Doppler parameters are consistent with   abnormal left ventricular relaxation (grade 1 diastolic   dysfunction). The E/e&' ratio is between 8-15, suggesting   indeterminate LV filling pressure. - Ventricular septum: Septal motion showed abnormal function and   dyssynergy. - Aortic valve: Trileaflet. Sclerosis without stenosis. There was   no regurgitation. - Mitral valve: Mildly thickened leaflets . There was trivial   regurgitation. - Right ventricle: The cavity size was normal. Systolic function is   low normal. - Right atrium: The atrium was normal in size. - Tricuspid valve: There was trivial regurgitation. - Pulmonary arteries: PA peak pressure: 24 mm Hg (S). - Inferior vena cava: The vessel was normal in size. The   respirophasic diameter changes were in the normal range (>= 50%),   consistent with normal central venous pressure.  Impressions:  - Compared to a recent echo in 06/2015, the LVEF is stable at   55-60%, however, there is inferior hypokinesis and relatively   hyperdynamic anterior wall motion.  Cardiac Catheterization: 10/04/15    Prox LAD lesion, 85% stenosed. Patent LIMA to LAD.  Severe native 3 vessel CAD.  Lat 1st Mrg-2 lesion, 100% stenosed. Occluded SVG to OM.  Ost RCA to Mid RCA lesion, 70% stenosed. Ocluded SVG to RCA,  Dist RCA lesion, 99% stenosed. Post intervention with  a 2.25 x 12 Promus Premier, there is a 0% residual stenosis.  Normal LVEDP.   Switch to Brilinta from Plavix.  She needs aggressive secondary prevention.  Patient with residual ST elevation in the inferior leads but her chest pain has improved significantly.  Proximal RCA disease did not seem critical, compared to distal lesion.    Echo 03/03/18: Study Conclusions  - Left ventricle: The cavity  size was normal. Wall thickness was   increased in a pattern of mild LVH. Systolic function was mildly   reduced. The estimated ejection fraction was in the range of 45%   to 50%. Severe inferior hypokinesis to akinesis. Doppler   parameters are consistent with abnormal left ventricular   relaxation (grade 1 diastolic dysfunction). The E/e&' ratio is   between 8-15, suggesting indeterminate LV filling pressure. - Aortic valve: Trileaflet. Sclerosis without stenosis. There was   no regurgitation. - Mitral valve: Mildly thickened leaflets . There was trivial   regurgitation. - Left atrium: The atrium was normal in size. - Inferior vena cava: The vessel was normal in size. The   respirophasic diameter changes were in the normal range (>= 50%),   consistent with normal central venous pressure.  Impressions:  - Compared to a prior study in 09/2015, the LVEF is lower at 45-50%   - global hypokinesis with inferior wall hypokinesis is noted.   ASSESSMENT & PLAN:    1. CAD s/p CABG 06/2015 with early vein graft failure.  - STEMI 10/04/15 with early loss of SVG to OM & SVG to RCA  s/p PCI to distal RCA lesion with a  Promus Premier stent. Patent LIMA to LAD.   -  She is doing well with no significant angina - Continue ASA,  statin, metoprolol and imdur.   2. HLD - Continue statin and Repatha - will repeat lipid panel today  3. HTN - BP is controlled.   4. DM - Managed by PCP , last A1c 7.8%. working on diet now with nutritionist.   5. Psoriatic arthritis.   6. Renal stones. Per Urology.  Follow up 6 months   Medication Adjustments/Labs and Tests Ordered: Current medicines are reviewed at length with the patient today.  Concerns regarding medicines are outlined above.  Medication changes, Labs and Tests ordered today are listed in the Patient Instructions below. There are no Patient Instructions on file for this visit.   Signed, Shanya Ferriss Martin[place], MD  04/03/2020 4:38 PM    [address], [place], [place]  [address] Phone: [phone]; Fax: [phone]

## 2020-04-06 ENCOUNTER — Observation Stay (HOSPITAL_COMMUNITY)
Admission: EM | Admit: 2020-04-06 | Discharge: 2020-04-07 | Disposition: A | Discharge status: Home / Self Care | Payer: Medicare HMO | Attending: Cardiovascular Disease | Admitting: Cardiovascular Disease

## 2020-04-06 ENCOUNTER — Ambulatory Visit: Payer: Medicare HMO | Admitting: Cardiology

## 2020-04-06 ENCOUNTER — Emergency Department (HOSPITAL_COMMUNITY): Payer: Medicare HMO

## 2020-04-06 DIAGNOSIS — Z7982 Long term (current) use of aspirin: Secondary | ICD-10-CM | POA: Insufficient documentation

## 2020-04-06 DIAGNOSIS — Z79899 Other long term (current) drug therapy: Secondary | ICD-10-CM | POA: Diagnosis not present

## 2020-04-06 DIAGNOSIS — Z7984 Long term (current) use of oral hypoglycemic drugs: Secondary | ICD-10-CM | POA: Insufficient documentation

## 2020-04-06 DIAGNOSIS — E119 Type 2 diabetes mellitus without complications: Secondary | ICD-10-CM | POA: Insufficient documentation

## 2020-04-06 DIAGNOSIS — I2510 Atherosclerotic heart disease of native coronary artery without angina pectoris: Secondary | ICD-10-CM | POA: Diagnosis not present

## 2020-04-06 DIAGNOSIS — I5033 Acute on chronic diastolic (congestive) heart failure: Secondary | ICD-10-CM | POA: Diagnosis not present

## 2020-04-06 DIAGNOSIS — Z87891 Personal history of nicotine dependence: Secondary | ICD-10-CM | POA: Diagnosis not present

## 2020-04-06 DIAGNOSIS — I10 Essential (primary) hypertension: Secondary | ICD-10-CM

## 2020-04-06 DIAGNOSIS — I110 Hypertensive heart disease with heart failure: Secondary | ICD-10-CM | POA: Diagnosis not present

## 2020-04-06 DIAGNOSIS — I25798 Atherosclerosis of other coronary artery bypass graft(s) with other forms of angina pectoris: Secondary | ICD-10-CM | POA: Diagnosis not present

## 2020-04-06 DIAGNOSIS — E785 Hyperlipidemia, unspecified: Secondary | ICD-10-CM | POA: Diagnosis not present

## 2020-04-06 DIAGNOSIS — R0789 Other chest pain: Secondary | ICD-10-CM | POA: Diagnosis not present

## 2020-04-06 DIAGNOSIS — Z20822 Contact with and (suspected) exposure to covid-19: Secondary | ICD-10-CM | POA: Insufficient documentation

## 2020-04-06 DIAGNOSIS — R079 Chest pain, unspecified: Principal | ICD-10-CM | POA: Insufficient documentation

## 2020-04-06 DIAGNOSIS — K219 Gastro-esophageal reflux disease without esophagitis: Secondary | ICD-10-CM | POA: Insufficient documentation

## 2020-04-06 LAB — CBC WITH DIFFERENTIAL/PLATELET
Abs Immature Granulocytes: 0.03 10*3/uL (ref 0.00–0.07)
Basophils Absolute: 0.1 10*3/uL (ref 0.0–0.1)
Basophils Relative: 1 %
Eosinophils Absolute: 0.3 10*3/uL (ref 0.0–0.5)
Eosinophils Relative: 5 %
HCT: 43 % (ref 36.0–46.0)
Hemoglobin: 13.1 g/dL (ref 12.0–15.0)
Immature Granulocytes: 1 %
Lymphocytes Relative: 35 %
Lymphs Abs: 1.9 10*3/uL (ref 0.7–4.0)
MCH: 28.9 pg (ref 26.0–34.0)
MCHC: 30.5 g/dL (ref 30.0–36.0)
MCV: 94.9 fL (ref 80.0–100.0)
Monocytes Absolute: 0.4 10*3/uL (ref 0.1–1.0)
Monocytes Relative: 7 %
Neutro Abs: 2.7 10*3/uL (ref 1.7–7.7)
Neutrophils Relative %: 51 %
Platelets: 198 10*3/uL (ref 150–400)
RBC: 4.53 MIL/uL (ref 3.87–5.11)
RDW: 15.1 % (ref 11.5–15.5)
WBC: 5.3 10*3/uL (ref 4.0–10.5)
nRBC: 0 % (ref 0.0–0.2)

## 2020-04-06 LAB — TROPONIN I (HIGH SENSITIVITY)
Troponin I (High Sensitivity): 23 ng/L — ABNORMAL HIGH (ref <18)
Troponin I (High Sensitivity): 29 ng/L — ABNORMAL HIGH (ref <18)

## 2020-04-06 LAB — BASIC METABOLIC PANEL
Anion gap: 10 (ref 5–15)
BUN: 10 mg/dL (ref 8–23)
CO2: 18 mmol/L — ABNORMAL LOW (ref 22–32)
Calcium: 7.4 mg/dL — ABNORMAL LOW (ref 8.9–10.3)
Chloride: 114 mmol/L — ABNORMAL HIGH (ref 98–111)
Creatinine, Ser: 0.63 mg/dL (ref 0.44–1.00)
GFR, Estimated: >60 mL/min (ref >60)
Glucose, Bld: 156 mg/dL — ABNORMAL HIGH (ref 70–99)
Potassium: 4.7 mmol/L (ref 3.5–5.1)
Sodium: 142 mmol/L (ref 135–145)

## 2020-04-06 LAB — SARS CORONAVIRUS 2 BY RT PCR (HOSPITAL ORDER, PERFORMED IN ~~LOC~~ HOSPITAL LAB): SARS Coronavirus 2: NEGATIVE

## 2020-04-06 LAB — CREATININE, SERUM
Creatinine, Ser: 0.88 mg/dL (ref 0.44–1.00)
GFR, Estimated: >60 mL/min (ref >60)

## 2020-04-06 LAB — GLUCOSE, CAPILLARY: Glucose-Capillary: 155 mg/dL — ABNORMAL HIGH (ref 70–99)

## 2020-04-06 LAB — HIV ANTIBODY (ROUTINE TESTING W REFLEX): HIV Screen 4th Generation wRfx: NONREACTIVE

## 2020-04-06 LAB — CBC
HCT: 46.7 % — ABNORMAL HIGH (ref 36.0–46.0)
Hemoglobin: 14.9 g/dL (ref 12.0–15.0)
MCH: 29.4 pg (ref 26.0–34.0)
MCHC: 31.9 g/dL (ref 30.0–36.0)
MCV: 92.1 fL (ref 80.0–100.0)
Platelets: 268 10*3/uL (ref 150–400)
RBC: 5.07 MIL/uL (ref 3.87–5.11)
RDW: 15 % (ref 11.5–15.5)
WBC: 8.6 10*3/uL (ref 4.0–10.5)
nRBC: 0 % (ref 0.0–0.2)

## 2020-04-06 LAB — CBG MONITORING, ED: Glucose-Capillary: 136 mg/dL — ABNORMAL HIGH (ref 70–99)

## 2020-04-06 LAB — HEMOGLOBIN A1C
Hgb A1c MFr Bld: 7.4 % — ABNORMAL HIGH (ref 4.8–5.6)
Mean Plasma Glucose: 165.68 mg/dL

## 2020-04-06 LAB — BRAIN NATRIURETIC PEPTIDE: B Natriuretic Peptide: 187.5 pg/mL — ABNORMAL HIGH (ref 0.0–100.0)

## 2020-04-06 MED ORDER — ACETAMINOPHEN 325 MG PO TABS: 650.0000 mg | ORAL_TABLET | ORAL | Status: DC | PRN

## 2020-04-06 MED ORDER — INSULIN ASPART 100 UNIT/ML ~~LOC~~ SOLN: 0.0000 [IU] | Freq: Three times a day (TID) | SUBCUTANEOUS | Status: DC

## 2020-04-06 MED ORDER — FAMOTIDINE 20 MG PO TABS: 20.0000 mg | ORAL_TABLET | Freq: Every evening | ORAL | Status: DC | PRN

## 2020-04-06 MED ORDER — NITROGLYCERIN 0.4 MG SL SUBL: 0.4000 mg | SUBLINGUAL_TABLET | SUBLINGUAL | Status: DC | PRN

## 2020-04-06 MED ORDER — ISOSORBIDE MONONITRATE ER 30 MG PO TB24: 15.0000 mg | ORAL_TABLET | Freq: Every day | ORAL | Status: DC

## 2020-04-06 MED ORDER — AMLODIPINE BESYLATE 5 MG PO TABS
5.0000 mg | ORAL_TABLET | Freq: Every day | ORAL | Status: DC
  Administered 2020-04-07: 5 mg via ORAL
  Filled 2020-04-06 (×2): qty 1

## 2020-04-06 MED ORDER — METOPROLOL TARTRATE 25 MG PO TABS
37.5000 mg | ORAL_TABLET | Freq: Two times a day (BID) | ORAL | Status: DC
  Administered 2020-04-06 – 2020-04-07 (×2): 37.5 mg via ORAL
  Filled 2020-04-06 (×2): qty 1

## 2020-04-06 MED ORDER — ASPIRIN EC 81 MG PO TBEC: 81.0000 mg | DELAYED_RELEASE_TABLET | Freq: Every day | ORAL | Status: DC

## 2020-04-06 MED ORDER — ASPIRIN EC 81 MG PO TBEC
81.0000 mg | DELAYED_RELEASE_TABLET | Freq: Every day | ORAL | Status: DC
Start: 2020-04-07 — End: 2020-04-07
  Administered 2020-04-07: 81 mg via ORAL
  Filled 2020-04-06: qty 1

## 2020-04-06 MED ORDER — ASPIRIN 81 MG PO CHEW
324.0000 mg | CHEWABLE_TABLET | ORAL | Status: DC
  Filled 2020-04-06: qty 4

## 2020-04-06 MED ORDER — METFORMIN HCL 500 MG PO TABS
1000.0000 mg | ORAL_TABLET | Freq: Two times a day (BID) | ORAL | Status: DC
  Administered 2020-04-06 – 2020-04-07 (×2): 1000 mg via ORAL
  Filled 2020-04-06 (×2): qty 2

## 2020-04-06 MED ORDER — ASPIRIN 300 MG RE SUPP: 300.0000 mg | RECTAL | Status: DC

## 2020-04-06 MED ORDER — HEPARIN SODIUM (PORCINE) 5000 UNIT/ML IJ SOLN
5000.0000 [IU] | Freq: Three times a day (TID) | INTRAMUSCULAR | Status: DC
  Filled 2020-04-06 (×2): qty 1

## 2020-04-06 MED ORDER — ONDANSETRON HCL 4 MG/2ML IJ SOLN: 4.0000 mg | Freq: Four times a day (QID) | INTRAMUSCULAR | Status: DC | PRN

## 2020-04-06 MED ORDER — ATORVASTATIN CALCIUM 80 MG PO TABS
80.0000 mg | ORAL_TABLET | Freq: Every day | ORAL | Status: DC
  Filled 2020-04-06: qty 8

## 2020-04-06 MED ORDER — ISOSORBIDE MONONITRATE ER 30 MG PO TB24
30.0000 mg | ORAL_TABLET | Freq: Every day | ORAL | Status: DC
  Administered 2020-04-07: 30 mg via ORAL
  Filled 2020-04-06 (×2): qty 1

## 2020-04-06 NOTE — ED Triage Notes (Signed)
[facility] from [facility]. Pt called EMS for CP. Has Cardiology appt this afternoon. Also having nausea and a cough.

## 2020-04-06 NOTE — ED Notes (Addendum)
Pt is refusing to take meds stating that she is not going to take any medications until she gets something else to eat because she has not eaten all day. Pt was given a [place] sandwich bag and received a dinner tray however, pt was unpleased with the dinner tray. Pt is wanting to speak with MD about medications ordered pt states these aren't the meds she takes at home. MD has been notified.

## 2020-04-06 NOTE — H&P (Addendum)
Cardiology Admission History and Physical:   Patient ID: JAMIRACLE HAUSNER MRN: [id number]; DOB: 10/10/1960   Admission date: 04/06/2020  Primary Care Provider: Leeroy Cha, [facility] Cardiologist: [name], MD   Chief Complaint:  Chest pain   Patient Profile:   [name] [name] is a 60 y.o. female with a hx of CAD (s/p CABG on 06/19/2015 w/ LIMA-LAD, SVG-RCA-occluded, SVG-OM2-occluded), lupus, DM2, psoriatic arthritis followed by Rheumatology, HTN, and HLD who presented to [facility] after experiencing an episode of chest pain earlier today.   History of Present Illness:   [name] [name] is a 60yo F with a hx as stated above who presented to [facility] after an episode of chest pain that began approximately 9am this morning. She states she has been in her usual state of health until this morning after she called for her ride to go to [employer] earlier today.  At that time, she reports midsternal and epigastric chest pressure with no radiation and no exertional symptoms.  She decided to proceed to her destination despite her symptoms.  On arrival, she was sitting at a computer in [employer] when her symptoms worsened.  She went to the bathroom due to nausea however there was no emesis.  Given her history with worrisome symptoms, she asked the librarian to call EMS for further investigation.  She does report increased stress since last October as she lost her apartment has been staying in an extended stay facility.  She has lost most of her income due to the high cost of living there.  She also reports that although her son has been very supportive, he is also going through lots of medical issues at this time as well which causes her tremendous stress.  She is currently chest pain-free.  Other than the episode earlier today, she reports no other anginal symptoms.  She denies shortness of breath, LE edema, orthopnea, palpitations, presyncopal or syncopal episodes.  She has had no recent illness  with fever, chills or cough.  She does state that more recently due to her stress, she has bought cigarettes however is only intermittently smoked several of them as she feels somewhat embarrassed to have done so.  She wishes to get herself back on track.  She states although staying at the extended stay hotel, she continues to watch her diet and walks on a regular basis.  She continues to work 20 hours/week to keep her self busy.  She is currently attempting to find resources to purchase at home.  On EMS arrival, she was given SL NTG and ASA with some improvement. EKG on presentation with no ischemic changes, non specific TW abnormalitites. HsT found to be 23>>29. BNP mild elevated at 187. CXR with no pleural effusion or edema. Cr stable at 0.63.  BP is elevated.   She has a hx of CAD dating back to 2017 after presenting to the hospital with a NSTEMI. LHC at that time showed severe three vessel disease with 80% RCA stenosis, 85% Prox LAD stenosis, and 90% 1st Mrg with 85% 2nd Mrg. CABG was recommended which was performed on 4/4 with the LIMA-LAD, SVG-RCA, and SVG-OM2. She was started on ASA and Plavix.  Unfortunately she presented 3 months later 09/2015 with a 3 week hx of chest pain found to have an inferior STEMI. She was take emergently to the cath lab which showed a patent LIMA to the LAD, occluded SVG to OM, occluded SVG to RCA. There was a dRCA 99% stenosis  which was treated with PCI/DES. She was then transitioned to ASA and Brilinta. Echocardiogram showed EF at 55-60% with inferior hypokinesis. Her Zocor was changed to Lipitor.   She was most recently seen by Dr. Martinique in follow up 09/2019 at which time she was doing well from a CV standpoint. She hd no c/o chest pain or SOB. She was working with a nutritionist and was walking 3 days per week.   Past Medical History:  Diagnosis Date  . Acute ST elevation myocardial infarction (STEMI) involving right coronary artery ([place]) 10/04/2015  . Anxiety   .  Anxiety and depression   . CAD (coronary artery disease)    a. CABG 06/2015: LIMA-LAD, SVG-RCA, SVG-OM2 b. STEMI s/p PCI to distal RCA lesion with a small Promus Premier stent. ( patent LIMA to the LAD, occluded SVG to OM, occluded SVG to RCA.)  . CAD (coronary artery disease) of artery bypass graft; occluded SVG to RCA and occluded SVG to OM 10/08/2015  . Empty sella ([place])   . Hidradenitis axillaris   . History of kidney stones   . Hyperlipemia   . Hypertension   . Kidney stones   . Lupus ([place]) dx'd 2008   "they think I have this; have to get retested" (06/12/2015)  . Obesity (BMI 30.0-34.9)   . Ovarian cyst   . Pneumonia X 2  . PONV (postoperative nausea and vomiting)   . Rheumatoid aortitis   . Rheumatoid arthritis ([place])   . S/P angioplasty with stent; 10/04/15 to distal RCA lesion. with Promus premier. 10/08/2015  . Scoliosis   . Sleep apnea    patient states "it is mild"  . Stroke [facility]) March 2014   left MCA branches; "they say I have them all the time", denies residual on 06/12/2015  . Tobacco abuse   . Type II diabetes mellitus ([place])     Past Surgical History:  Procedure Laterality Date  . ABDOMINAL HYSTERECTOMY    . BREAST BIOPSY Bilateral   . CARDIAC CATHETERIZATION N/A 06/13/2015   Procedure: Left Heart Cath and Coronary Angiography;  Surgeon: Peter M Martinique, MD;  Location: [place] [facility];  Service: Cardiovascular;  Laterality: N/A;  . CARDIAC CATHETERIZATION N/A 10/04/2015   Procedure: Left Heart Cath and Coronary Angiography;  Surgeon: [name], MD;  Location: [place] [facility];  Service: Cardiovascular;  Laterality: N/A;  . CARDIAC CATHETERIZATION N/A 10/04/2015   Procedure: Coronary Stent Intervention;  Surgeon: [name], MD;  Location: [place] [facility];  Service: Cardiovascular;  Laterality: N/A;  . CORONARY ARTERY BYPASS GRAFT N/A 06/19/2015   Procedure: CORONARY ARTERY BYPASS GRAFTING (CABG) TIMES FOUR USING LEFT INTERNAL MAMMARY, RIGHT  SAPHENOUS LEG VEIN AND CRYO SAPHENOUS VEIN;  Surgeon: [name], MD;  Location: [place];  Service: Open Heart Surgery;  Laterality: N/A;  . DILATION AND CURETTAGE OF UTERUS    . HYDRADENITIS EXCISION Right 12/20/2019   Procedure: EXCISION OF RIGHT  HIDRADENITIS AXILLARY;  Surgeon: Donnie Mesa, MD;  Location: [place];  Service: General;  Laterality: Right;  . IR URETERAL STENT LEFT NEW ACCESS W/O SEP NEPHROSTOMY CATH  01/10/2019  . NEPHROLITHOTOMY Left 01/10/2019   Procedure: NEPHROLITHOTOMY PERCUTANEOUS;  Surgeon: Kathie Rhodes, MD;  Location: [facility];  Service: Urology;  Laterality: Left;  . TEE WITHOUT CARDIOVERSION N/A 06/19/2015   Procedure: TRANSESOPHAGEAL ECHOCARDIOGRAM (TEE);  Surgeon: [name], MD;  Location: [place];  Service: Open Heart Surgery;  Laterality: N/A;  . TONSILLECTOMY  1960s  . TUBAL LIGATION       Medications Prior to Admission: Prior to Admission medications   Medication Sig Start Date End Date Taking? Authorizing Provider  acetaminophen (TYLENOL) 325 MG tablet Take 2 tablets (650 mg total) by mouth every 4 (four) hours as needed for headache or mild pain. 10/08/15   Isaiah Serge, NP  aspirin EC 81 MG EC tablet Take 1 tablet (81 mg total) by mouth daily. 06/25/15   Nani Skillern, [name]  atorvastatin (LIPITOR) 80 MG tablet TAKE 1 TABLET EVERY DAY  AT  6PM Patient taking differently: Take 80 mg by mouth daily at 6 PM.  05/29/17   Martinique, Peter M, MD  Biotin w/ Vitamins C & E (HAIR/SKIN/NAILS PO) Take 1-2 tablets by mouth See admin instructions. Take 1 tablet in the morning & take 2 tablets in the evening.    [provider]  cetirizine (ZYRTEC) 10 MG tablet Take 10 mg by mouth daily. 02/18/17   [provider]  cholecalciferol (VITAMIN D) 25 MCG (1000 UNIT) tablet Take 1,000 Units by mouth in the morning and at bedtime.    [provider]  Evolocumab with Infusor ([place]) 420 MG/3.5ML SOCT Inject 420 mg into the  skin every 30 (thirty) days. 04/25/19   Martinique, Peter M, MD  famotidine (PEPCID) 20 MG tablet Take 20 mg by mouth at bedtime as needed for heartburn or indigestion.    [provider]  guaiFENesin (MUCINEX) 600 MG 12 hr tablet Take 600 mg by mouth 2 (two) times daily as needed (congestion.).    [provider]  isosorbide mononitrate (IMDUR) 30 MG 24 hr tablet Take 0.5 tablets (15 mg total) by mouth daily. 06/21/18   Martinique, Peter M, MD  liraglutide (VICTOZA) 18 MG/3ML SOPN Inject 1.8 mg into the skin at bedtime.     [provider]  metFORMIN (GLUCOPHAGE) 1000 MG tablet Take 1,000 mg by mouth 2 (two) times daily. 04/06/15   [provider]  metoprolol tartrate (LOPRESSOR) 25 MG tablet Take one and a half (1.5) tablets (37.5 mg) by mouth twice (2) daily. Patient taking differently: Take 37.5 mg by mouth 2 (two) times daily.  09/15/17   Martinique, Peter M, MD  Multiple Vitamin (MULTIVITAMIN WITH MINERALS) TABS tablet Take 1 tablet by mouth daily. Centrum Silver    [provider]  nitroGLYCERIN (NITROSTAT) 0.4 MG SL tablet DISSOLVE 1 TABLET UNDER THE TONGUE EVERY 5 MINUTES AS  NEEDED FOR CHEST PAIN. MAX  OF 3 TABLETS IN 15 MINUTES. CALL 911 IF PAIN PERSISTS. Patient taking differently: Place 0.4 mg under the tongue every 5 (five) minutes as needed for chest pain.  11/15/18   Martinique, Peter M, MD  oxyCODONE (OXY IR/ROXICODONE) 5 MG immediate release tablet Take 1 tablet (5 mg total) by mouth every 6 (six) hours as needed for severe pain. 12/20/19   Donnie Mesa, MD  Potassium Citrate 15 MEQ (1620 MG) TBCR Take 1,620 mg by mouth in the morning and at bedtime. 10/05/19   [provider]     Allergies:    Allergies  Allergen Reactions  . Other Nausea And Vomiting    Anesthesia    Social History:   Social History   Socioeconomic History  . Marital status: Widowed    Spouse name: Not on file  . Number of children: 2  . Years of education: college  .  Highest education level: Not on file  Occupational History  .  Occupation: Disability  Tobacco Use  . Smoking status: Former Smoker    Packs/day: 0.50    Years: 36.00    Pack years: 18.00    Types: Cigarettes    Quit date: 06/12/2014    Years since quitting: 5.8  . Smokeless tobacco: Never Used  Vaping Use  . Vaping Use: Never used  Substance and Sexual Activity  . Alcohol use: No    Alcohol/week: 0.0 standard drinks  . Drug use: No  . Sexual activity: Not Currently  Other Topics Concern  . Not on file  Social History Narrative   Patient currently lives at home with her disabled husband who was shot in 2009 and he is total care. She lives with her son and daughter and there is an 8 that comes in. Patient currently is on disability   She occasionally drinks caffeine.    Social Determinants of Health   Financial Resource Strain: Not on file  Food Insecurity: Not on file  Transportation Needs: Not on file  Physical Activity: Not on file  Stress: Not on file  Social Connections: Not on file  Intimate Partner Violence: Not on file    Family History:   The patient's family history includes Alzheimer's disease in an other family member; Breast cancer in her mother and another family member; Depression in her son; Diabetes in her mother and another family member; Heart failure in her mother; Stroke in her maternal aunt.    ROS:  Please see the history of present illness.  All other ROS reviewed and negative.     Physical Exam/Data:   Vitals:   04/06/20 1051 04/06/20 1200 04/06/20 1300  BP:  (!) 155/85 (!) 166/102  Pulse:  60 64  Resp:  19 13  SpO2: 97% 97% 98%   No intake or output data in the 24 hours ending 04/06/20 1422 Last 3 Weights 12/20/2019 12/12/2019 10/03/2019  Weight (lbs) 219 lb 9.3 oz 219 lb 8 oz 224 lb  Weight (kg) 99.6 kg 99.565 kg 101.606 kg     There is no height or weight on file to calculate BMI.   General: Pleasant, well developed, well nourished,  NAD Neck: Negative for carotid bruits. No JVD Lungs:Clear to ausculation bilaterally. No wheezes, rales, or rhonchi. Breathing is unlabored. Cardiovascular: RRR with S1 S2. No murmurs Abdomen: Soft, non-tender, non-distended. No obvious abdominal masses. MSK: Strength and tone appear normal for age. 5/5 in all extremities Extremities: No edema.  Radial pulses 2+ bilaterally Neuro: Alert and oriented. No focal deficits. No facial asymmetry. MAE spontaneously. Psych: Responds to questions appropriately with normal affect.     EKG:  The ECG that was done 04/06/2020 was personally reviewed and demonstrates NSR with nonspecific T wave abnormalities, similar to prior tracings with no acute ST changes, HR 61 bpm  Relevant CV Studies:  Echocardiogram: 10/05/15  - Left ventricle: The cavity size was normal. Wall thickness was increased in a pattern of mild LVH. Systolic function was normal. The estimated ejection fraction was in the range of 55% to 60%. Inferior wall hypokinesis. Doppler parameters are consistent with abnormal left ventricular relaxation (grade 1 diastolic dysfunction). The E/e&' ratio is between 8-15, suggesting indeterminate LV filling pressure. - Ventricular septum: Septal motion showed abnormal function and dyssynergy. - Aortic valve: Trileaflet. Sclerosis without stenosis. There was no regurgitation. - Mitral valve: Mildly thickened leaflets . There was trivial regurgitation. - Right ventricle: The cavity size was normal. Systolic function is low normal. - Right atrium:  The atrium was normal in size. - Tricuspid valve: There was trivial regurgitation. - Pulmonary arteries: PA peak pressure: 24 mm Hg (S). - Inferior vena cava: The vessel was normal in size. The respirophasic diameter changes were in the normal range (>= 50%), consistent with normal central venous pressure.  Cardiac Catheterization: 10/04/15    Prox LAD lesion, 85%  stenosed. Patent LIMA to LAD.  Severe native 3 vessel CAD.  Lat 1st Mrg-2 lesion, 100% stenosed. Occluded SVG to OM.  Ost RCA to Mid RCA lesion, 70% stenosed. Ocluded SVG to RCA,  Dist RCA lesion, 99% stenosed. Post intervention with a 2.25 x 12 Promus Premier, there is a 0% residual stenosis.  Normal LVEDP.  Switch to Brilinta from Plavix. She needs aggressive secondary prevention. Patient with residual ST elevation in the inferior leads but her chest pain has improved significantly. Proximal RCA disease did not seem critical, compared to distal lesion.    Echocardiogram 03/03/18:   - Left ventricle: The cavity size was normal. Wall thickness was increased in a pattern of mild LVH. Systolic function was mildly reduced. The estimated ejection fraction was in the range of 45% to 50%. Severe inferior hypokinesis to akinesis. Doppler parameters are consistent with abnormal left ventricular relaxation (grade 1 diastolic dysfunction). The E/e&' ratio is between 8-15, suggesting indeterminate LV filling pressure. - Aortic valve: Trileaflet. Sclerosis without stenosis. There was no regurgitation. - Mitral valve: Mildly thickened leaflets . There was trivial regurgitation. - Left atrium: The atrium was normal in size. - Inferior vena cava: The vessel was normal in size. The respirophasic diameter changes were in the normal range (>= 50%), consistent with normal central venous pressure.  Impressions:  - Compared to a prior study in 09/2015, the LVEF is lower at 45-50% - global hypokinesis with inferior wall hypokinesis is noted.  Laboratory Data:  High Sensitivity Troponin:   Recent Labs  Lab 04/06/20 1124 04/06/20 1311  TROPONINIHS 23* 29*      Chemistry Recent Labs  Lab 04/06/20 1124  NA 142  K 4.7  CL 114*  CO2 18*  GLUCOSE 156*  BUN 10  CREATININE 0.63  CALCIUM 7.4*  GFRNONAA >60  ANIONGAP 10    No results for input(s): PROT,  ALBUMIN, AST, ALT, ALKPHOS, BILITOT in the last 168 hours. Hematology Recent Labs  Lab 04/06/20 1124  WBC 5.3  RBC 4.53  HGB 13.1  HCT 43.0  MCV 94.9  MCH 28.9  MCHC 30.5  RDW 15.1  PLT 198   BNP Recent Labs  Lab 04/06/20 1124  BNP 187.5*    DDimer No results for input(s): DDIMER in the last 168 hours.   Radiology/Studies:  DG Chest Port 1 View  Result Date: 04/06/2020 CLINICAL DATA:  Chest pain EXAM: PORTABLE CHEST 1 VIEW COMPARISON:  02/20/2017 FINDINGS: Previous median sternotomy and CABG procedure. Heart size is enlarged. No pleural effusion or edema. No airspace consolidation. IMPRESSION: Cardiac enlargement, status post CABG procedure. Electronically Signed   By: Kerby Moors M.D.   On: 04/06/2020 11:15   Assessment and Plan:   1. Chest pain with known history of CAD s/p CABG 06/2015 with early vein graft failure with STEMI 09/2015: -Pt presented [facility] after an episode of chest pain that began approximately 9am this morning. She states she has been in her usual state of health until this morning after she called for her ride to go to [employer] earlier today.  At that time, she reports midsternal and epigastric  chest pressure with no radiation and no exertional symptoms. -On EMS arrival, she was given SL NTG and ASA with some improvement.  -EKG on presentation with no ischemic changes, non specific TW abnormalitites. HsT found to be 23>>29.  -BNP mild elevated at 187>>CXR with no pleural effusion or edema -BP is elevated with systolic pressures in the 180 range -She has a history of CABG with LIMA to LAD patent on most recent cardiac cath 09/2015, known occlusion of SVG to OM and SVG to RCA found to have a distal RCA occlusion treated with PCI/DES 09/2015 -She has had no recent ischemic work-up given no recent anginal symptoms -Continue ASA, statin, metoprolol and Imdur -For now, we will plan to admit her overnight for symptom monitoring and better BP control. Will add  amlodipine to her regimen and increase IMDUR to 30mg  QD. If BP remains elevated tomorrow, will consider increasing BB.  -We will consider further ischemic evaluation in the OP setting, however if recurrent symptoms, will need further inpatient work up.   2. HLD: -Last LDL, 69 on 11/12/2017 -Follows with lipid clinic  -Currently on Repatha   3. HTN: -Elevated today, 188/106>>183/106>177/102 -Will add amlodipine 5mg  to her regimen and increase IMDUR to 30mg  PO QD  -Will leave metoprolol at 37.5 BID -Follow response  4. DM2: -Last Hb A1c, 7.0 on 12/12/19 -Will cover with SSI while inpatient    Risk Assessment/Risk Scores:          Severity of Illness: The appropriate patient status for this patient is OBSERVATION. Observation status is judged to be reasonable and necessary in order to provide the required intensity of service to ensure the patient's safety. The patient's presenting symptoms, physical exam findings, and initial radiographic and laboratory data in the context of their medical condition is felt to place them at decreased risk for further clinical deterioration. Furthermore, it is anticipated that the patient will be medically stable for discharge from the hospital within 2 midnights of admission. The following factors support the patient status of observation.   " The patient's presenting symptoms include chest pain. " The physical exam findings include chest pain. " The initial radiographic and laboratory data are elevated troponin with hx of CABG.     For questions or updates, please contact [place] Please consult [email or web address] for contact info under     Signed, Kathyrn Drown, NP  04/06/2020 2:22 PM   Patient seen and examined. Agree with assessment and plan. [name] is a very pleasant 60 year old African-American female who is known CAD, type 2 diabetes mellitus, lupus, psoriatic arthritis, as well as significant hyperlipidemia. In 2017 she was  found to have significant multivessel CAD and underwent CABG revascularization surgery with a LIMA to the LAD, SVG to the RCA and SVG to the OM 2. She developed recurrent chest pain symptomatology several months later and repeat catheterization revealed an occluded vein graft to the RCA as well as an occluded vein graft to her circumflex vessel. At that time, she underwent successful DES stenting to her distal 99% RCA stenosis. She continued to have a long 70% proximal RCA stenosis. Subsequently, her chest pain symptomatology significantly improved. She has a history of significant hyperlipidemia and has been on PCSK9 inhibition in addition to atorvastatin 80 mg. She is followed by Dr. Martinique. Recently she admits to being under significant increased stress. She has been working approximately 20 hours/week and recently lost her apartment has been staying in an extended facility. She was scheduled  to see Dr. Martinique today for follow-up evaluation. However this morning at approximately 9 AM she developed significant substernal chest pressure which was similar to her prior discomfort. Her chest pain ultimately lasted 6 hours and she presented to the emergency room. She also has a history of GERD for which she is on Pepcid. She felt her chest pain was similar to her prior anginal symptomatology. Presently in the emergency room she is chest pain-free. Her blood pressure is significantly elevated at 188/106. HEENT is unremarkable. Sclera is anicteric. She has a thick neck. Lungs were clear. He did not have chest wall tenderness to palpation. There is a faint 1/6 systolic murmur. There was central adiposity. Pulses were 2+. She did not have significant edema. Neurologic exam was grossly nonfocal. She had normal affect and mood. Her ECG shows normal sinus rhythm at 61 bpm with previously noted inferolateral T wave abnormalities, nonspecific without significantly changed from her prior tracings. Laboratories notable for  potassium of 4.7. Creatinine 0.63. Hemoglobin 13.1/hematocrit 43.0/platelets 198. SARS coronavirus test is negative. Chest x-ray was unremarkable without active cardiopulmonary disease. She is status post CABG. With the patient's presentation and chest pain lasting 6 hours duration, I have recommended that she stay overnight for at least observation. Initial troponin is 23 > 29, again against acute coronary syndrome. We will repeat an additional laboratory later today. With her significant hypertension, will initiate amlodipine 5 mg and titrate isosorbide mononitrate to 60 mg. She continues to be on beta-blocker therapy and resting pulse is in the 60s. We will repeat ECG in a.m. If she remains stable plan is for discharge tomorrow with outpatient follow-up with Dr. Martinique and possible noninvasive ischemic evaluation unless increasing symptomatology follow-ups for which repeat angiography may be recommended.   Troy Sine, MD, [facility] 04/06/2020 4:21 PM

## 2020-04-06 NOTE — ED Provider Notes (Addendum)
[facility] EMERGENCY DEPARTMENT Provider Note   CSN: [id number] Arrival date & time: 04/06/20  1047     History Chief Complaint  Patient presents with  . Chest Pain    [name] [name] is a 60 y.o. female.  60 year old female with prior medical history as detailed below presents for evaluation of reported chest discomfort.  Patient reports onset of chest pressure and discomfort right around 9 AM today.  Symptoms were associated with mild nausea.  She did not have emesis.  She denies shortness of breath.  She took aspirin and nitroglycerin prior to arrival with some improvement in her symptoms.  She reports history of known CAD status post intervention.  She is followed by Dr. Cadience Bradfield Martinique cardiology.  She reports that she has an appointment with Dr. Martinique this afternoon at 2:00.  She also complains of mild cough.  She works as a Programmer, applications.  She reports that she is status post COVID vaccination.  The history is provided by the patient and medical records.  Chest Pain Pain location:  Substernal area Pain quality: aching and pressure   Pain radiates to:  Does not radiate Pain severity:  Mild Onset quality:  Gradual Duration:  3 hours Timing:  Constant Progression:  Unchanged Chronicity:  New Relieved by:  Nothing Worsened by:  Nothing      Past Medical History:  Diagnosis Date  . Acute ST elevation myocardial infarction (STEMI) involving right coronary artery ([place]) 10/04/2015  . Anxiety   . Anxiety and depression   . CAD (coronary artery disease)    a. CABG 06/2015: LIMA-LAD, SVG-RCA, SVG-OM2 b. STEMI s/p PCI to distal RCA lesion with a small Promus Premier stent. ( patent LIMA to the LAD, occluded SVG to OM, occluded SVG to RCA.)  . CAD (coronary artery disease) of artery bypass graft; occluded SVG to RCA and occluded SVG to OM 10/08/2015  . Empty sella ([place])   . Hidradenitis axillaris   . History of kidney stones   . Hyperlipemia   .  Hypertension   . Kidney stones   . Lupus ([place]) dx'd 2008   "they think I have this; have to get retested" (06/12/2015)  . Obesity (BMI 30.0-34.9)   . Ovarian cyst   . Pneumonia X 2  . PONV (postoperative nausea and vomiting)   . Rheumatoid aortitis   . Rheumatoid arthritis ([place])   . S/P angioplasty with stent; 10/04/15 to distal RCA lesion. with Promus premier. 10/08/2015  . Scoliosis   . Sleep apnea    patient states "it is mild"  . Stroke [facility]) March 2014   left MCA branches; "they say I have them all the time", denies residual on 06/12/2015  . Tobacco abuse   . Type II diabetes mellitus [facility])     Patient Active Problem List   Diagnosis Date Noted  . Renal calculus, left 01/10/2019  . Chest pain in adult 03/02/2017  . Hypoxia   . Acute on chronic diastolic CHF (congestive heart failure) ([place])   . Hx of CABG   . S/P angioplasty with stent; 10/04/15 to distal RCA lesion. with Promus premier. 10/08/2015  . History of ST elevation myocardial infarction (STEMI) 10/04/2015  . Superficial incisional surgical site infection, medial left lower extremity 07/10/2015  . Dyslipidemia-LDL 197, goal < 70 06/13/2015  . Chest pain with moderate risk of acute coronary syndrome   . Tobacco abuse   . Type 2 diabetes mellitus, uncontrolled ([place]) 06/04/2012  .  Lupus ([place]) 06/03/2012  . History of stroke 06/03/2012  . Empty sella syndrome ([place]) 06/03/2012  . HTN (hypertension) 06/03/2012  . Rheumatoid arthritis ([place]) 06/03/2012  . Hidradenitis axillaris 06/03/2012  . Obesity (BMI 30.0-34.9)     Past Surgical History:  Procedure Laterality Date  . ABDOMINAL HYSTERECTOMY    . BREAST BIOPSY Bilateral   . CARDIAC CATHETERIZATION N/A 06/13/2015   Procedure: Left Heart Cath and Coronary Angiography;  Surgeon: Duquan Gillooly M Martinique, MD;  Location: [facility] CV LAB;  Service: Cardiovascular;  Laterality: N/A;  . CARDIAC CATHETERIZATION N/A 10/04/2015   Procedure: Left Heart Cath and Coronary Angiography;   Surgeon: [name], MD;  Location: [facility];  Service: Cardiovascular;  Laterality: N/A;  . CARDIAC CATHETERIZATION N/A 10/04/2015   Procedure: Coronary Stent Intervention;  Surgeon: [name], MD;  Location: [facility] CV LAB;  Service: Cardiovascular;  Laterality: N/A;  . CORONARY ARTERY BYPASS GRAFT N/A 06/19/2015   Procedure: CORONARY ARTERY BYPASS GRAFTING (CABG) TIMES FOUR USING LEFT INTERNAL MAMMARY, RIGHT SAPHENOUS LEG VEIN AND CRYO SAPHENOUS VEIN;  Surgeon: [name], MD;  Location: [facility];  Service: Open Heart Surgery;  Laterality: N/A;  . DILATION AND CURETTAGE OF UTERUS    . HYDRADENITIS EXCISION Right 12/20/2019   Procedure: EXCISION OF RIGHT  HIDRADENITIS AXILLARY;  Surgeon: Donnie Mesa, MD;  Location: [facility];  Service: General;  Laterality: Right;  . IR URETERAL STENT LEFT NEW ACCESS W/O SEP NEPHROSTOMY CATH  01/10/2019  . NEPHROLITHOTOMY Left 01/10/2019   Procedure: NEPHROLITHOTOMY PERCUTANEOUS;  Surgeon: Kathie Rhodes, MD;  Location: [facility];  Service: Urology;  Laterality: Left;  . TEE WITHOUT CARDIOVERSION N/A 06/19/2015   Procedure: TRANSESOPHAGEAL ECHOCARDIOGRAM (TEE);  Surgeon: [name], MD;  Location: [facility];  Service: Open Heart Surgery;  Laterality: N/A;  . TONSILLECTOMY  1960s  . TUBAL LIGATION       OB History   No obstetric history on file.     Family History  Problem Relation Age of Onset  . Diabetes Mother   . Heart failure Mother   . Breast cancer Mother   . Stroke Maternal Aunt   . Diabetes Other        maternal  . Breast cancer Other   . Alzheimer's disease Other        maternal  . Depression Son     Social History   Tobacco Use  . Smoking status: Former Smoker    Packs/day: 0.50    Years: 36.00    Pack years: 18.00    Types: Cigarettes    Quit date: 06/12/2014    Years since quitting: 5.8  . Smokeless tobacco: Never Used  Vaping Use  . Vaping Use: Never used  Substance Use Topics  . Alcohol use: No     Alcohol/week: 0.0 standard drinks  . Drug use: No    Home Medications Prior to Admission medications   Medication Sig Start Date End Date Taking? Authorizing Provider  acetaminophen (TYLENOL) 325 MG tablet Take 2 tablets (650 mg total) by mouth every 4 (four) hours as needed for headache or mild pain. 10/08/15   Isaiah Serge, NP  aspirin EC 81 MG EC tablet Take 1 tablet (81 mg total) by mouth daily. 06/25/15   Nani Skillern, [name]  atorvastatin (LIPITOR) 80 MG tablet TAKE 1 TABLET EVERY DAY  AT  6PM Patient taking differently: Take 80 mg by mouth daily at 6 PM.  05/29/17  Martinique, Markela Wee M, MD  Biotin w/ Vitamins C & E (HAIR/SKIN/NAILS PO) Take 1-2 tablets by mouth See admin instructions. Take 1 tablet in the morning & take 2 tablets in the evening.    [provider]  cetirizine (ZYRTEC) 10 MG tablet Take 10 mg by mouth daily. 02/18/17   [provider]  cholecalciferol (VITAMIN D) 25 MCG (1000 UNIT) tablet Take 1,000 Units by mouth in the morning and at bedtime.    [provider]  Evolocumab with Infusor ([place]) 420 MG/3.5ML SOCT Inject 420 mg into the skin every 30 (thirty) days. 04/25/19   Martinique, Emmeline Winebarger M, MD  famotidine (PEPCID) 20 MG tablet Take 20 mg by mouth at bedtime as needed for heartburn or indigestion.    [provider]  guaiFENesin (MUCINEX) 600 MG 12 hr tablet Take 600 mg by mouth 2 (two) times daily as needed (congestion.).    [provider]  isosorbide mononitrate (IMDUR) 30 MG 24 hr tablet Take 0.5 tablets (15 mg total) by mouth daily. 06/21/18   Martinique, Kaysea Raya M, MD  liraglutide (VICTOZA) 18 MG/3ML SOPN Inject 1.8 mg into the skin at bedtime.     [provider]  metFORMIN (GLUCOPHAGE) 1000 MG tablet Take 1,000 mg by mouth 2 (two) times daily. 04/06/15   [provider]  metoprolol tartrate (LOPRESSOR) 25 MG tablet Take one and a half (1.5) tablets (37.5 mg) by mouth twice (2)  daily. Patient taking differently: Take 37.5 mg by mouth 2 (two) times daily.  09/15/17   Martinique, Ardis Lawley M, MD  Multiple Vitamin (MULTIVITAMIN WITH MINERALS) TABS tablet Take 1 tablet by mouth daily. Centrum Silver    [provider]  nitroGLYCERIN (NITROSTAT) 0.4 MG SL tablet DISSOLVE 1 TABLET UNDER THE TONGUE EVERY 5 MINUTES AS  NEEDED FOR CHEST PAIN. MAX  OF 3 TABLETS IN 15 MINUTES. CALL 911 IF PAIN PERSISTS. Patient taking differently: Place 0.4 mg under the tongue every 5 (five) minutes as needed for chest pain.  11/15/18   Martinique, Elise Gladden M, MD  oxyCODONE (OXY IR/ROXICODONE) 5 MG immediate release tablet Take 1 tablet (5 mg total) by mouth every 6 (six) hours as needed for severe pain. 12/20/19   Donnie Mesa, MD  Potassium Citrate 15 MEQ (1620 MG) TBCR Take 1,620 mg by mouth in the morning and at bedtime. 10/05/19   [provider]    Allergies    Other  Review of Systems   Review of Systems  Cardiovascular: Positive for chest pain.  All other systems reviewed and are negative.   Physical Exam Updated Vital Signs SpO2 97%   Physical Exam Vitals and nursing note reviewed.  Constitutional:      General: She is not in acute distress.    Appearance: She is well-developed and well-nourished.  HENT:     Head: Normocephalic and atraumatic.     Mouth/Throat:     Mouth: Oropharynx is clear and moist.  Eyes:     Extraocular Movements: EOM normal.     Conjunctiva/sclera: Conjunctivae normal.     Pupils: Pupils are equal, round, and reactive to light.  Cardiovascular:     Rate and Rhythm: Normal rate and regular rhythm.     Heart sounds: Normal heart sounds.  Pulmonary:     Effort: Pulmonary effort is normal. No respiratory distress.     Breath sounds: Normal breath sounds.  Abdominal:     General: There is no distension.     Palpations:  Abdomen is soft.     Tenderness: There is no abdominal tenderness.  Musculoskeletal:        General: No deformity or edema.  Normal range of motion.     Cervical back: Normal range of motion and neck supple.  Skin:    General: Skin is warm and dry.  Neurological:     General: No focal deficit present.     Mental Status: She is alert and oriented to person, place, and time.  Psychiatric:        Mood and Affect: Mood and affect normal.     ED Results / Procedures / Treatments   Labs (all labs ordered are listed, but only abnormal results are displayed) Labs Reviewed  BASIC METABOLIC PANEL - Abnormal; Notable for the following components:      Result Value   Chloride 114 (*)    CO2 18 (*)    Glucose, Bld 156 (*)    Calcium 7.4 (*)    All other components within normal limits  BRAIN NATRIURETIC PEPTIDE - Abnormal; Notable for the following components:   B Natriuretic Peptide 187.5 (*)    All other components within normal limits  TROPONIN I (HIGH SENSITIVITY) - Abnormal; Notable for the following components:   Troponin I (High Sensitivity) 23 (*)    All other components within normal limits  TROPONIN I (HIGH SENSITIVITY) - Abnormal; Notable for the following components:   Troponin I (High Sensitivity) 29 (*)    All other components within normal limits  SARS CORONAVIRUS 2 BY RT PCR (HOSPITAL ORDER, [facility])  CBC WITH DIFFERENTIAL/PLATELET    EKG EKG Interpretation  Date/Time:  Friday April 06 2020 13:10:49 EST Ventricular Rate:  62 PR Interval:    QRS Duration: 107 QT Interval:  419 QTC Calculation: 426 R Axis:   84 Text Interpretation: Sinus rhythm Probable left atrial enlargement Consider right ventricular hypertrophy Nonspecific T abnormalities, lateral leads Confirmed by Dene Gentry [phone]) on 04/06/2020 1:16:07 PM   Radiology DG Chest Port 1 View  Result Date: 04/06/2020 CLINICAL DATA:  Chest pain EXAM: PORTABLE CHEST 1 VIEW COMPARISON:  02/20/2017 FINDINGS: Previous median sternotomy and CABG procedure. Heart size is enlarged. No pleural effusion or  edema. No airspace consolidation. IMPRESSION: Cardiac enlargement, status post CABG procedure. Electronically Signed   By: Kerby Moors M.D.   On: 04/06/2020 11:15    Procedures Procedures (including critical care time)  Medications Ordered in ED Medications - No data to display  ED Course  I have reviewed the triage vital signs and the nursing notes.  Pertinent labs & imaging results that were available during my care of the patient were reviewed by me and considered in my medical decision making (see chart for details).    MDM Rules/Calculators/A&P                          MDM  Screen complete  [name] [name] was evaluated in Emergency Department on 04/06/2020 for the symptoms described in the history of present illness. She was evaluated in the context of the global COVID-19 pandemic, which necessitated consideration that the patient might be at risk for infection with the SARS-CoV-2 virus that causes COVID-19. Institutional protocols and algorithms that pertain to the evaluation of patients at risk for COVID-19 are in a state of rapid change based on information released by regulatory bodies including the CDC and federal and state organizations. These policies  and algorithms were followed during the patient's care in the ED.   Patient is presenting for evaluation of reported chest discomfort.    Patient with known prior history of CAD.  Initial EKG is without acute ischemia.  Initial troponin is 23.   Patient is known to Dr. Mostyn Varnell [place] of Cardiology.  Cardiology is aware of case (at 1400 - second troponin pending) and will evaluate the patient in the ED.  Cardiology to admit.     Final Clinical Impression(s) / ED Diagnoses Final diagnoses:  Chest pain, unspecified type    Rx / DC Orders ED Discharge Orders    None       Valarie Merino, MD 04/06/20 1605    Valarie Merino, MD 04/06/20 [phone]

## 2020-04-07 DIAGNOSIS — I5033 Acute on chronic diastolic (congestive) heart failure: Secondary | ICD-10-CM | POA: Diagnosis not present

## 2020-04-07 DIAGNOSIS — I2510 Atherosclerotic heart disease of native coronary artery without angina pectoris: Secondary | ICD-10-CM | POA: Diagnosis not present

## 2020-04-07 DIAGNOSIS — Z87891 Personal history of nicotine dependence: Secondary | ICD-10-CM | POA: Diagnosis not present

## 2020-04-07 DIAGNOSIS — E119 Type 2 diabetes mellitus without complications: Secondary | ICD-10-CM | POA: Diagnosis not present

## 2020-04-07 DIAGNOSIS — I110 Hypertensive heart disease with heart failure: Secondary | ICD-10-CM | POA: Diagnosis not present

## 2020-04-07 DIAGNOSIS — R0789 Other chest pain: Secondary | ICD-10-CM

## 2020-04-07 DIAGNOSIS — Z20822 Contact with and (suspected) exposure to covid-19: Secondary | ICD-10-CM | POA: Diagnosis not present

## 2020-04-07 DIAGNOSIS — Z79899 Other long term (current) drug therapy: Secondary | ICD-10-CM | POA: Diagnosis not present

## 2020-04-07 DIAGNOSIS — R079 Chest pain, unspecified: Secondary | ICD-10-CM | POA: Diagnosis not present

## 2020-04-07 DIAGNOSIS — Z7982 Long term (current) use of aspirin: Secondary | ICD-10-CM | POA: Diagnosis not present

## 2020-04-07 LAB — BASIC METABOLIC PANEL
Anion gap: 12 (ref 5–15)
BUN: 14 mg/dL (ref 8–23)
CO2: 25 mmol/L (ref 22–32)
Calcium: 9.4 mg/dL (ref 8.9–10.3)
Chloride: 105 mmol/L (ref 98–111)
Creatinine, Ser: 0.95 mg/dL (ref 0.44–1.00)
GFR, Estimated: >60 mL/min (ref >60)
Glucose, Bld: 184 mg/dL — ABNORMAL HIGH (ref 70–99)
Potassium: 4.1 mmol/L (ref 3.5–5.1)
Sodium: 142 mmol/L (ref 135–145)

## 2020-04-07 LAB — GLUCOSE, CAPILLARY: Glucose-Capillary: 175 mg/dL — ABNORMAL HIGH (ref 70–99)

## 2020-04-07 MED ORDER — AMLODIPINE BESYLATE 5 MG PO TABS: 5.0000 mg | ORAL_TABLET | Freq: Every day | ORAL | 6 refills | Status: DC

## 2020-04-07 MED ORDER — ISOSORBIDE MONONITRATE ER 30 MG PO TB24: 30.0000 mg | ORAL_TABLET | Freq: Every day | ORAL | 3 refills | Status: DC

## 2020-04-07 NOTE — Progress Notes (Signed)
Progress Note  Patient Name: [name] [name] Date of Encounter: 04/07/2020  [facility] Cardiologist: [name], MD   Subjective   No recurrent chest pain  Inpatient Medications    Scheduled Meds: . amLODipine  5 mg Oral Daily  . aspirin  324 mg Oral NOW   Or  . aspirin  300 mg Rectal NOW  . aspirin EC  81 mg Oral Daily  . atorvastatin  80 mg Oral q1800  . heparin  5,000 Units Subcutaneous Q8H  . insulin aspart  0-15 Units Subcutaneous TID WC  . isosorbide mononitrate  30 mg Oral Daily  . metFORMIN  1,000 mg Oral BID  . metoprolol tartrate  37.5 mg Oral BID   Continuous Infusions:  PRN Meds: acetaminophen, famotidine, nitroGLYCERIN, ondansetron (ZOFRAN) IV   Vital Signs    Vitals:   04/06/20 1955 04/07/20 0005 04/07/20 0512 04/07/20 0954  BP: (!) 151/73 (!) 153/93 (!) 153/91 (!) 158/102  Pulse: 69 64 71 67  Resp: 18 18 18 19   Temp: 98.2 F (36.8 C) 98 F (36.7 C) 98.1 F (36.7 C) 98 F (36.7 C)  TempSrc: Oral Oral Oral Oral  SpO2: 98% 95% 97% 98%  Weight:   100.9 kg     Intake/Output Summary (Last 24 hours) at 04/07/2020 1025 Last data filed at 04/07/2020 0917 Gross per 24 hour  Intake 480 ml  Output --  Net 480 ml   Last 3 Weights 04/07/2020 12/20/2019 12/12/2019  Weight (lbs) 222 lb 8 oz 219 lb 9.3 oz 219 lb 8 oz  Weight (kg) 100.925 kg 99.6 kg 99.565 kg      Telemetry    SR - Personally Reviewed  ECG    n/a - Personally Reviewed  Physical Exam   GEN: No acute distress.   Neck: No JVD Cardiac: RRR, no murmurs, rubs, or gallops.  Respiratory: Clear to auscultation bilaterally. GI: Soft, nontender, non-distended  MS: No edema; No deformity. Neuro:  Nonfocal  Psych: Normal affect   Labs    High Sensitivity Troponin:   Recent Labs  Lab 04/06/20 1124 04/06/20 1311  TROPONINIHS 23* 29*      Chemistry Recent Labs  Lab 04/06/20 1124 04/06/20 1943 04/07/20 0521  NA 142  --  142  K 4.7  --  4.1  CL 114*  --  105  CO2 18*   --  25  GLUCOSE 156*  --  184*  BUN 10  --  14  CREATININE 0.63 0.88 0.95  CALCIUM 7.4*  --  9.4  GFRNONAA >60 >60 >60  ANIONGAP 10  --  12     Hematology Recent Labs  Lab 04/06/20 1124 04/06/20 1943  WBC 5.3 8.6  RBC 4.53 5.07  HGB 13.1 14.9  HCT 43.0 46.7*  MCV 94.9 92.1  MCH 28.9 29.4  MCHC 30.5 31.9  RDW 15.1 15.0  PLT 198 268    BNP Recent Labs  Lab 04/06/20 1124  BNP 187.5*     DDimer No results for input(s): DDIMER in the last 168 hours.   Radiology    DG Chest Port 1 View  Result Date: 04/06/2020 CLINICAL DATA:  Chest pain EXAM: PORTABLE CHEST 1 VIEW COMPARISON:  02/20/2017 FINDINGS: Previous median sternotomy and CABG procedure. Heart size is enlarged. No pleural effusion or edema. No airspace consolidation. IMPRESSION: Cardiac enlargement, status post CABG procedure. Electronically Signed   By: Kerby Moors M.D.   On: 04/06/2020 11:15    Cardiac Studies  Patient Profile     [name] [name] is a 60 y.o. female with a hx of CAD (s/p CABG on 06/19/2015 w/ LIMA-LAD, SVG-RCA-occluded, SVG-OM2-occluded), lupus, DM2, psoriatic arthritis followed by Rheumatology, HTN, and HLD who presented to [facility] after experiencing an episode of chest pain earlier today.   Assessment & Plan    1. CAD/Chest pain - She has a history of CABG with LIMA to LAD patent on most recent cardiac cath 09/2015, known occlusion of SVG to OM and SVG to RCA found to have a distal RCA occlusion treated with PCI/DES 09/2015  -EKG on presentation with no ischemic changes, non specific TW abnormalitites. HsT found to be 23>>29.  -BP is elevated with systolic pressures in the 180 range  2. HTN - just got first norvasc dose this AM, bp's remain elevated, further adjustments as outpatient.    [place] for discharge, we will arrange outpatient f/u   For questions or updates, please contact [place] Please consult [email or web address] for contact info under        Signed, Carlyle Dolly, MD   04/07/2020, 10:25 AM

## 2020-04-07 NOTE — Discharge Summary (Signed)
Discharge Summary    Patient ID: [name] [name] MRN: [id number]; DOB: 03/16/1961  Admit date: 04/06/2020 Discharge date: 04/07/2020  Primary Care Provider: Leeroy Cha, MD  Primary Cardiologist: [name], MD   Discharge Diagnoses    Principal Problem:   Accelerated hypertension Active Problems:   Hyperlipidemia with target LDL less than 70   Chest pain   CAD   DM   Diagnostic Studies/Procedures    None this admission    History of Present Illness     [name] [name] is a 60 y.o. female with  a hx of CAD s/p CABG on 06/19/2015 with subsequent PCIs,  lupus, DM2, psoriatic arthritis followed by Rheumatology, HTN, and HLD who presented to [facility] after experiencing an episode of chest pain.  Hx of CAD in 2017 s/p CABG. Unfortunately she presented 3 months later 09/2015 with a 3 week hx of chest pain found to have an inferior STEMI. She was take emergently to the cath lab which showed a patent LIMA to the LAD, occluded SVG to OM, occluded SVG to RCA. There was a dRCA 99% stenosis which was treated with PCI/DES. She was then transitioned to ASA and Brilinta. Echocardiogram showed EF at 55-60% with inferior hypokinesis. Her Zocor was changed to Lipitor.   Presented to [facility] after an episode of chest pain that began approximately 9am. Described as midsternal and epigastric chest pressure with no radiation and no exertional symptoms. Had nausea but no vomiting. Under stress and has smoked cigarettes intermittently. EMS was called due to worsen symptoms. She was given SL NTG and ASA with some improvement. EKG on presentation with no ischemic changes, non specific TW abnormalitites. HsT found to be 23>>29. BNP mild elevated at 187. CXR with no pleural effusion or edema. Cr stable at 0.63.  BP is elevated. Admitted overnight for observation.   Hospital Course     Consultants: None  1. Chest pain with known history of CAD s/p CABG 06/2015 with early vein graft failure with STEMI  09/2015: - Started on amlodipine to her regimen and increased IMDUR to 30mg  QD. No recurrent chest pain overnight. EKG without new ischemic changes. Ambulated in hallway without chest discomfort. Continue home regimen. Will scheduled close outpatient follow up.   2. HLD: -Last LDL, 69 on 11/12/2017 -Follows with lipid clinic  -Currently on Repatha   3. HTN: -Elevated today on admission  -Added amlodipine 5mg  to her regimen and increase IMDUR to 30mg  PO QD  -Continue metoprolol at 37.5 BID - Advised to keep log of blood pressure and bring during office visit.   4. DM2: -Last Hb A1c, 7.0 on 12/12/19 -SSI while inpatient   Did the patient have an acute coronary syndrome (MI, NSTEMI, STEMI, etc) this admission?:  No                               Did the patient have a percutaneous coronary intervention (stent / angioplasty)?:  No.    Discharge Vitals Blood pressure (!) 158/102, pulse 67, temperature 98 F (36.7 C), temperature source Oral, resp. rate 19, weight 100.9 kg, SpO2 98 %.  Filed Weights   04/07/20 0512  Weight: 100.9 kg   Physical Exam Constitutional:      Appearance: She is well-developed.  Eyes:     Pupils: Pupils are equal, round, and reactive to light.  Cardiovascular:     Rate and Rhythm: Normal rate and regular rhythm.  Heart sounds: Normal heart sounds.  Pulmonary:     Effort: Pulmonary effort is normal.     Breath sounds: Normal breath sounds.  Abdominal:     General: Bowel sounds are normal.     Palpations: Abdomen is soft.  Musculoskeletal:        General: Normal range of motion.     Cervical back: Normal range of motion and neck supple.  Skin:    General: Skin is warm and dry.  Neurological:     General: No focal deficit present.     Mental Status: She is alert and oriented to person, place, and time.  Psychiatric:        Mood and Affect: Mood normal.    Labs & Radiologic Studies    CBC Recent Labs    04/06/20 1124 04/06/20 1943  WBC  5.3 8.6  NEUTROABS 2.7  --   HGB 13.1 14.9  HCT 43.0 46.7*  MCV 94.9 92.1  PLT 198 737   Basic Metabolic Panel Recent Labs    04/06/20 1124 04/06/20 1943 04/07/20 0521  NA 142  --  142  K 4.7  --  4.1  CL 114*  --  105  CO2 18*  --  25  GLUCOSE 156*  --  184*  BUN 10  --  14  CREATININE 0.63 0.88 0.95  CALCIUM 7.4*  --  9.4   High Sensitivity Troponin:   Recent Labs  Lab 04/06/20 1124 04/06/20 1311  TROPONINIHS 23* 29*   Hemoglobin A1C Recent Labs    04/06/20 1943  HGBA1C 7.4*  _____________  DG Chest Port 1 View  Result Date: 04/06/2020 CLINICAL DATA:  Chest pain EXAM: PORTABLE CHEST 1 VIEW COMPARISON:  02/20/2017 FINDINGS: Previous median sternotomy and CABG procedure. Heart size is enlarged. No pleural effusion or edema. No airspace consolidation. IMPRESSION: Cardiac enlargement, status post CABG procedure. Electronically Signed   By: Kerby Moors M.D.   On: 04/06/2020 11:15   Disposition   Pt is being discharged home today in good condition.  Follow-up Plans & Appointments     Follow-up Information    Martinique, Peter M, MD Follow up.   Specialty: Cardiology Why: office will call with date and time of appointment  Contact information: [address] [place] [address] [phone]              Discharge Instructions    Diet - low sodium heart healthy   Complete by: As directed    Increase activity slowly   Complete by: As directed       Discharge Medications   Allergies as of 04/07/2020      Reactions   Other Nausea And Vomiting   Anesthesia      Medication List    TAKE these medications   amLODipine 5 MG tablet Commonly known as: NORVASC Take 1 tablet (5 mg total) by mouth daily. Start taking on: April 08, 2020   aspirin 81 MG EC tablet Take 1 tablet (81 mg total) by mouth daily.   atorvastatin 80 MG tablet Commonly known as: LIPITOR TAKE 1 TABLET EVERY DAY  AT  6PM What changed:   how much to take  how  to take this  when to take this  additional instructions   cetirizine 10 MG tablet Commonly known as: ZYRTEC Take 10 mg by mouth daily.   cholecalciferol 25 MCG (1000 UNIT) tablet Commonly known as: VITAMIN D Take 1,000 Units by mouth in the  morning and at bedtime.   famotidine 20 MG tablet Commonly known as: PEPCID Take 20 mg by mouth at bedtime as needed for heartburn or indigestion.   guaiFENesin 600 MG 12 hr tablet Commonly known as: MUCINEX Take 600 mg by mouth 2 (two) times daily as needed (congestion.).   HAIR/SKIN/NAILS PO Take 1-2 tablets by mouth See admin instructions. Take 1 tablet in the morning & take 2 tablets in the evening.   isosorbide mononitrate 30 MG 24 hr tablet Commonly known as: IMDUR Take 1 tablet (30 mg total) by mouth daily. What changed: how much to take   liraglutide 18 MG/3ML Sopn Commonly known as: VICTOZA Inject 1.8 mg into the skin at bedtime.   metFORMIN 1000 MG tablet Commonly known as: GLUCOPHAGE Take 1,000 mg by mouth 2 (two) times daily.   metoprolol tartrate 25 MG tablet Commonly known as: LOPRESSOR Take one and a half (1.5) tablets (37.5 mg) by mouth twice (2) daily. What changed:   how much to take  how to take this  when to take this  additional instructions   multivitamin with minerals Tabs tablet Take 1 tablet by mouth daily. Centrum Silver   nitroGLYCERIN 0.4 MG SL tablet Commonly known as: NITROSTAT DISSOLVE 1 TABLET UNDER THE TONGUE EVERY 5 MINUTES AS  NEEDED FOR CHEST PAIN. MAX  OF 3 TABLETS IN 15 MINUTES. CALL 911 IF PAIN PERSISTS. What changed: See the new instructions.   Potassium Citrate 15 MEQ (1620 MG) Tbcr Take 15 mg by mouth in the morning and at bedtime.   Repatha Pushtronex System 420 MG/3.5ML Soct Generic drug: Evolocumab with Infusor Inject 420 mg into the skin every 30 (thirty) days.          Outstanding Labs/Studies   None  Duration of Discharge Encounter   Greater than 30 minutes  including physician time.  Jarrett Soho, PA 04/07/2020, 10:48 AM

## 2020-04-07 NOTE — Plan of Care (Signed)

## 2020-04-07 NOTE — Progress Notes (Addendum)
Ambulated in hallway on RA approx 200 ft. Tolerated well.

## 2020-04-07 NOTE — TOC Progression Note (Addendum)
Transition of Care [facility]) - Progression Note    Patient Details  Name: MYRON LONA MRN: [id number] Date of Birth: 18-Apr-1960  Transition of Care [facility]) CM/SW Contact  Konrad Penta, RN Phone Number: [phone] 04/07/2020, 11:40 AM  Clinical Narrative:   Received message from patient's nurse indicating Mrs. Rabel was teary during dc instructions. Asked that Probation officer contact patient to discuss patient's concerns.  Telephone call made to Mrs. Nicole Kindred on her mobile prior to dc. Mrs. Keil reports she lives in an extended stay with her son. States she works part time Monday- Thursday from 830 am until 1 pm. States she is actively looking for somewhere else to stay. States paying the weekly rent at the extended stay is exhausting her financial resources. States most of her money goes to pay for housing. States her room is paid for up until this Tuesday.  Mrs. Boulay uses [employer] for transportation. Denies any issues with medications. States she uses [employer] order.  Agreeable to [place] Management referral.  No home health needs.   Confirmed best contact number for Mrs. Espino is [phone].         Expected Discharge Plan and Services           Expected Discharge Date: 04/07/20                                  Social Determinants of Health (SDOH) Interventions    Readmission Risk Interventions No flowsheet data found.     Marthenia Rolling, MSN, RN,BSN [facility] Case Manager [phone]

## 2020-04-07 NOTE — Progress Notes (Signed)
D/C instructions given and reviewed. Questions asked and answered but encouraged to call with any concerns. Tele and IV removed, tolerated well. Pt tearful about financial stressors at home. Will notify case manager.

## 2020-04-09 ENCOUNTER — Other Ambulatory Visit: Payer: Self-pay | Admitting: *Deleted

## 2020-04-09 ENCOUNTER — Other Ambulatory Visit: Payer: Self-pay

## 2020-04-09 ENCOUNTER — Encounter: Payer: Self-pay | Admitting: *Deleted

## 2020-04-09 NOTE — Patient Outreach (Signed)
[facility]) Care Management  04/09/2020  [name] [name] Feb 08, 1961 [id number]  CSW received a new referral on patient from [name] [name], [place], also with [facility]) Care Management, indicating that patient is in need of assistance with transitioning back home, as she was recently discharged from the hospital, on Saturday, April 07, 2020.  According to Ms. Hall, patient was assessed, but no home health needs were identified or recommended.  Patient was; however, agreeable to [facility] follow-up.  Patient is requesting assistance obtaining housing resources, as she currently resides in an extended stay motel with her son.    CSW will place a referral for patient to the Care Guides to assist with housing, as they are able to: 1.  Provide resource information for low-income housing in preferred areas. 2.  Help link the patient to appropriate county specific housing programs, such as the [employer], [employer], and various other community shelters.   3.  Assist with Section 8 housing applications.  [name] [name], BSW, MSW, LCSW  Licensed Education officer, environmental Health System  Mailing [place] [address] [place], [place] [address] Physical Address-[address], [place], [place] [address] Toll Free Main # [phone] Fax # [phone] Cell # [phone]  [name]

## 2020-04-09 NOTE — Patient Outreach (Signed)
[place] [facility] [facility]) Care Management  04/09/2020  [name] [name] 03/14/1961 [id number]   Referral Date: 04/09/20 Referral Source: ED referral Referral Reason: CM follow up, housing resources and needs.   Outreach Attempt: no answer. HIPAA compliant voice message left.     Plan: RN CM will wait return call.  If no return call will attempt again within 4 business days and send letter.   [name], RN, MSN [facility] Care Management Care Management Coordinator Direct Line [phone] Toll Free: [phone]  Fax: [phone]

## 2020-04-09 NOTE — Patient Outreach (Signed)
[facility]) Care Management  04/09/2020  [name] [name] December 07, 1960 [id number]   Referral Date: 04/09/20 Referral Source: ED referral Referral Reason: CM follow up, housing resources and needs.  Telephone call to patient as agreed.  Patient reports being in a rock in a hard place.  She lost her apartment and was very close to buying a home but with the housing market currently it has been rough. She lives in the extended stay with her son.  She reports that it costs 396/wk and it is exhausting all her resources and her son's.  She works part time as an Estate manager/land agent.  She uses [employer] transportation.  Discussed with patient that since my call this morning that Nat Christen, LCSW has done a referral for housing resources. She states that [place] left her a message and will be calling her back.    Patient has hx of CAD (s/p CABG on 06/19/2015 w/ LIMA-LAD, SVG-RCA-occluded, SVG-OM2-occluded), lupus, DM2, psoriatic arthritis followed by Rheumatology, HTN, and HLD.  Patient states she manages her chronic conditions and declined disease management support at this time.   Patient has follow up scheduled already with cardiologist and will be returning call to PCP for follow up as they have called today.    Patient expresses that her main thing right now she needs help with is housing.  Patient has no problems with her medications or obtaining them.  She takes medication as prescribed.   Plan: RN CM will with close the nursing aspect of referral at this time as patient expresses housing needs.   RN CM will forward note to [employer].    [name], RN, MSN [place] Management Care Management Coordinator Direct Line [phone] Cell [phone] Toll Free: [phone]  Fax: [phone]

## 2020-04-12 ENCOUNTER — Other Ambulatory Visit: Payer: Self-pay | Admitting: *Deleted

## 2020-04-12 ENCOUNTER — Encounter: Payer: Self-pay | Admitting: *Deleted

## 2020-04-12 ENCOUNTER — Telehealth: Payer: Self-pay | Admitting: Internal Medicine

## 2020-04-12 NOTE — Telephone Encounter (Signed)
   Telephone encounter was:  Unsuccessful.  04/12/2020 Name: RAKEISHA NYCE MRN: [id number] DOB: 01-06-1961  Unsuccessful outbound call made today to assist with:  housing.   Outreach Attempt:  1st Attempt  A HIPAA compliant voice message was left requesting a return call.  Instructed patient to call back at [phone].  [place], Care Management Phone: [phone] Email: [email or web address]

## 2020-04-12 NOTE — Telephone Encounter (Signed)
   Telephone encounter was:  Successful.  04/12/2020 Name: [name] MRN: [id number] DOB: 1961/03/10  [name] [name] is a 60 y.o. year old female who is a primary care patient of Leeroy Cha, MD . The community resource team was consulted for assistance with housing resources.   Care guide performed the following interventions: Patient provided with information about care guide support team and interviewed to confirm resource needs Discussed resources to assist with income based housing. I sent her an email with a list of income based housing for [place]. Patient called me back after I left a message earlier. She has actually tried to apply for first time home buyer programs and has been accepted. Getting a house [facility] approved right now is difficult. This patient does not qualify due to income to get any of the governmental programming. She has applied for all of them and refused section 8 because she said she makes too much money. .  Follow Up Plan:  No further follow up planned at this time. The patient has been provided with needed resources.  [place], Care Management Phone: [phone] Email: [email or web address]

## 2020-04-12 NOTE — Patient Outreach (Signed)
[facility]) Care Management  04/12/2020  [name] [name] [birth year] [id number]   CSW was able to make initial contact with patient today to perform phone assessment, as well as assess and assist with social work needs and services.  CSW introduced self, explained role and types of services provided through [place] Management ([place] Management).  CSW further explained to patient that CSW works with [place], Marthenia Rolling.  CSW then explained the reason for the call, indicating that Ms. [name] thought that patient would benefit from social work services and resources to assist with transitioning home from the hospital, discharged on Saturday, April 07, 2020, as well as assistance with obtaining housing resources, as patient and her adult son are currently residing in an extended stay hotel.  CSW obtained two HIPAA compliant identifiers from patient, which included patient's name and date of birth.  Patient admitted that she and her 1 year old disabled son, [name] [name] are currently residing at [facility] in [place], after having been evicted from their apartment, on Monday, January 02, 2020, due to non-payment of rent and utilities.  Patient indicated that she has been "forgiven of her past due balance at the previous apartment complex"; however, she is still in the process of trying to dispute, or ask for forgiveness of, the $600.00+ that she owes to [name] for utilities.  Patient stated that she is currently having to pay $396.00 per week, for a total of $1,584.00 per month, to stay at [facility], and is willing to continue to reside there with her son until they are able to secure permanent housing.  Patient also has 2 storage units, for which she is having to pay $285.00 per month.  Patient reported that she and her son recently submitted a low-income housing request for a home that just  became available, and are currently awaiting approval/denial.    Patient was very forthcoming with information, explaining to CSW that she is currently employed part-time as a Quarry manager Optometrist) with [employer], working 80 hours a month; 5 hours per day, 4 days per week, from 8:00AM until 1:00PM.  Patient receives [employer] ([place]), as well as Press photographer (Air cabin crew), which pays for her [employer] and all of her prescription medications, regardless of cost.  Patient utilizes [employer] Paramedic) to get to and from work, as well as to her physician appointments.  CSW reminded patient that she has a transportation benefit through [place], entitling her to 6 round-trip rides or 12 one-way rides, free of charge, per fiscal year.  CSW then provided patient with the contact information and process for establishing services.   Patient stated that [name] will continue to reside with her, even when she moves into permament housing, to assist her with ADL's (Activities of Daily Living), in addition to buying all the groceries each month and preparing the majority of the meals.  Patient reported that [name] receives a "substantial amount of money" from [employer] Charity fundraiser) each month, because his 3 daughters were also living with them, at the time of approval.  [name]'s 3 daughters now reside with their biological mothers.  Patient indicated that she needs help obtaining financial assistance to: 1) Pay off bad debt ($600.00+) to [name], 2) Pay rental property application fees (typically between $79.02-$40.97, per application submission), 3) Pay deposit/down payment (on rental  property or mortgage), 4) Pay her first month's rent/mortgage payment, and 5) Pay $500.000 "in good faith money".    CSW then provided patient with the following list of  resources: 1) HOPE ([employer] + Prevention of Eviction) - Patient has already exhausted funds for a lifetime.  2) FHA ([place]) Grant/Loan - Patient has self-enrolled in a housing seminar, offered through [place].  The seminar will take place on Saturday, April 21, 2020, from 9:00AM until 4:00PM.  Patient will be required to pay $500.00 "in good faith money" to submit with her certificate of completion, valid for 1 year.  Patient will then be able to apply for a $10,000.00 grant/loan, offered through [facility].  Patient is confident that she will be approved, with the understanding that she must reside in the home for at least 10 years, to avoid penalty.  3) [place] [employer] Grant/Loan - Patient has applied and has been approved for an $8,000.00 grant/loan.  With this grant/loan, patient must reside in the home for at least 5 years, without penalty, and the home must pass HUD (Housing and Teacher, music) and [facility] standards.  4) [employer] - Patient has already exhausted funds for this fiscal year.  5) [employer] - Patient approved for Section 8 [employer], having already received a voucher.  Patient is just waiting for housing to come available.  6) [place] Program - Patient already applied and approved for a $10,000.00 loan.  7) Product manager - Patient unaware of resource and will outreach today to inquire about obtaining financial assistance.  8) LIEAP ([place]) - Patient already applied but was denied funds.  9) [place] and [employer] - Patient has already exhausted funds for this fiscal year.  CSW offered to place a referral for patient to a Care Guide to offer more insight, assistance and resources, for which patient was more than agreeable.  In the meantime,  CSW also agreed to pursue additional housing resources and various community agencies that may be able to offer financial assistance.  Patient requested that [place] mail her a 2022 [place] Management Calendar, having remembered receiving one in the past, finding the resource information, medical tips and physician contacts to be very beneficial.  CSW will make a request of a care management assistant to place a calendar in the mail to patient today.  Patient was agreeable to having CSW follow-up with her again next week, on Friday, April 20, 2020, around 9:00AM, to ensure that she received the calendar in the mail, as well as to provide her with an additional list of resources.  CSW was able to confirm that patient has the correct contact information for CSW, encouraging her to contact CSW directly if she has progress to report, or if additional social work needs arise in the meantime.  Nat Christen, BSW, MSW, LCSW  Licensed Education officer, environmental Health System  Mailing [place] [address] [place], [place] [address] Physical Address-[address], [place], [place] [address] Toll Free Main # [phone] Fax # [phone] Cell # [phone]  [email or web address]

## 2020-04-20 ENCOUNTER — Other Ambulatory Visit: Payer: Self-pay | Admitting: *Deleted

## 2020-04-20 ENCOUNTER — Encounter: Payer: Self-pay | Admitting: *Deleted

## 2020-04-20 NOTE — Patient Outreach (Signed)
[facility]) Care Management  04/20/2020  [name] [name] 06-10-1960 [id number]   CSW was able to make contact with patient today to follow-up regarding financial resources, as well as to confirm that she received the list of resources provided to her last week by CSW.  Patient confirmed receipt of all resource information received from [place], as well as [name] [name], Care Guide, also with [place] Management.  CSW explained in conversation with patient today, that CSW and [name] [name] have provided her with all the resource information, and list of agencies that may be able to offer financial assistance, available and accessible in [facility].  Unfortunately, patient has already exhausted almost all resources and no further interventions needed.  CSW will perform a case closure on patient, as all goals of treatment have been met from social work standpoint and no additional social work needs have been identified at this time.  CSW will fax an update to patient's Primary Care Physician, Dr. Leeroy Cha to ensure that they are aware of CSW's involvement with patient's plan of care, in addition to routing Dr. Fara Olden a Physician Case Closure Letter.  CSW was able to confirm that patient has the correct contact information for CSW, encouraging her to contact CSW directly if additional social work needs arise in the near future.    Nat Christen, BSW, MSW, LCSW  Licensed Education officer, environmental Health System  Mailing [place] [address] [place], [place] [address] Physical Address-[address], [place], [place] [address] Toll Free Main # [phone] Fax # [phone] Cell # [phone]  [email or web address]

## 2020-04-24 DIAGNOSIS — I10 Essential (primary) hypertension: Secondary | ICD-10-CM | POA: Diagnosis not present

## 2020-04-24 DIAGNOSIS — M059 Rheumatoid arthritis with rheumatoid factor, unspecified: Secondary | ICD-10-CM | POA: Diagnosis not present

## 2020-04-24 DIAGNOSIS — I2102 ST elevation (STEMI) myocardial infarction involving left anterior descending coronary artery: Secondary | ICD-10-CM | POA: Diagnosis not present

## 2020-04-24 DIAGNOSIS — I5033 Acute on chronic diastolic (congestive) heart failure: Secondary | ICD-10-CM | POA: Diagnosis not present

## 2020-04-24 DIAGNOSIS — K219 Gastro-esophageal reflux disease without esophagitis: Secondary | ICD-10-CM | POA: Diagnosis not present

## 2020-04-24 DIAGNOSIS — E785 Hyperlipidemia, unspecified: Secondary | ICD-10-CM | POA: Diagnosis not present

## 2020-04-24 DIAGNOSIS — E1121 Type 2 diabetes mellitus with diabetic nephropathy: Secondary | ICD-10-CM | POA: Diagnosis not present

## 2020-04-24 DIAGNOSIS — I2510 Atherosclerotic heart disease of native coronary artery without angina pectoris: Secondary | ICD-10-CM | POA: Diagnosis not present

## 2020-04-24 DIAGNOSIS — E1165 Type 2 diabetes mellitus with hyperglycemia: Secondary | ICD-10-CM | POA: Diagnosis not present

## 2020-04-26 ENCOUNTER — Ambulatory Visit (INDEPENDENT_AMBULATORY_CARE_PROVIDER_SITE_OTHER): Payer: Medicare HMO | Admitting: Medical

## 2020-04-26 ENCOUNTER — Encounter: Payer: Self-pay | Admitting: Medical

## 2020-04-26 ENCOUNTER — Other Ambulatory Visit: Payer: Self-pay

## 2020-04-26 VITALS — BP 140/90 | HR 80 | Ht 66.0 in | Wt 224.0 lb

## 2020-04-26 DIAGNOSIS — I10 Essential (primary) hypertension: Secondary | ICD-10-CM

## 2020-04-26 DIAGNOSIS — E118 Type 2 diabetes mellitus with unspecified complications: Secondary | ICD-10-CM

## 2020-04-26 DIAGNOSIS — I25118 Atherosclerotic heart disease of native coronary artery with other forms of angina pectoris: Secondary | ICD-10-CM | POA: Diagnosis not present

## 2020-04-26 DIAGNOSIS — Z951 Presence of aortocoronary bypass graft: Secondary | ICD-10-CM | POA: Diagnosis not present

## 2020-04-26 DIAGNOSIS — E785 Hyperlipidemia, unspecified: Secondary | ICD-10-CM

## 2020-04-26 MED ORDER — ISOSORBIDE MONONITRATE ER 30 MG PO TB24: 30.0000 mg | ORAL_TABLET | Freq: Every day | ORAL | 3 refills | Status: DC

## 2020-04-26 MED ORDER — AMLODIPINE BESYLATE 5 MG PO TABS: 5.0000 mg | ORAL_TABLET | Freq: Every day | ORAL | 3 refills | Status: DC

## 2020-04-26 MED ORDER — REPATHA PUSHTRONEX SYSTEM 420 MG/3.5ML ~~LOC~~ SOCT: 420.0000 mg | SUBCUTANEOUS | 11 refills | Status: DC

## 2020-04-26 MED ORDER — REPATHA PUSHTRONEX SYSTEM 420 MG/3.5ML ~~LOC~~ SOCT: 420.0000 mg | SUBCUTANEOUS | 11 refills | Status: AC

## 2020-04-26 NOTE — Patient Instructions (Addendum)
Medication Instructions:  Your physician recommends that you continue on your current medications as directed. Please refer to the Current Medication list given to you today.  *If you need a refill on your cardiac medications before your next appointment, please call your pharmacy*  Lab Work: NONE ordered at this time of appointment   If you have labs (blood work) drawn today and your tests are completely normal, you will receive your results only by: Marland Kitchen MyChart Message (if you have MyChart) OR . A paper copy in the mail If you have any lab test that is abnormal or we need to change your treatment, we will call you to review the results.  Testing/Procedures: NONE ordered at this time of appointment   Follow-Up: At [facility], you and your health needs are our priority.  As part of our continuing mission to provide you with exceptional heart care, we have created designated Provider Care Teams.  These Care Teams include your primary Cardiologist (physician) and Advanced Practice Providers (APPs -  Physician Assistants and Nurse Practitioners) who all work together to provide you with the care you need, when you need it.  Your next appointment:   3 month(s)  The format for your next appointment:   In Person  Provider:   [name], MD  Other Instructions  Continue to monitor blood pressure (BP) at home. If BP is consistently greater than 130/80 please give our office a call.

## 2020-04-26 NOTE — Progress Notes (Signed)
Cardiology Office Note   Date:  05/03/2020   ID:  [name] [name], DOB [birth year], MRN [id number]  PCP:  Leeroy Cha, MD  Cardiologist:  [name], MD EP: None  Chief Complaint  Patient presents with  . Hospitalization Follow-up    Recent chest pain      History of Present Illness: [name] [name] is a 60 y.o. female with a PMH of CAD s/p CABG in 2017 with subsequent PCI/DES to dRCA in 2017, HTN, HLD, DM type 2, lupus, and psoriatic arthritis, who presents for post-hospital follow-up.  He was recently admitted to the hospital from 04/06/20-04/07/20 for accelerated HTN after presenting with chest pain. HsTrop was 23>29 and EKG non-ischemic with non-specific T wave abnormalities. She was started on amlodipine 5mg  daily and imdur was increased to 30 mg daily with improvement in symptoms.   She presents today for post-hospital follow-up. She attributes her recent episode of chest pain to GI upset after taking her medications on an empty stomach. She has not had any recurrent chest pain since discharge. She is in good spirits despite multiple hardships over the past year. She has been pre-approved to buy a house, however the housing market has made things quite difficult, and as a result, she and her son have been renting a room at a motel recently. 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 She reports having supportive medical providers who make sure she is taken care of. She states she has not had difficulty obtaining her medications. Her BP is 140/90 today despite not taking her medications this morning as she was rushing out the door to catch her transportation to work. She tells me she will take her meds upon returning to her room. She has had more consistent readings with SBP in the 130s recently. No complaints of SOB, palpitations, dizziness, lightheadedness, or syncope.     Past Medical History:  Diagnosis Date  . Acute ST elevation myocardial infarction (STEMI)  involving right coronary artery ([place]) 10/04/2015  . Anxiety   . Anxiety and depression   . CAD (coronary artery disease)    a. CABG 06/2015: LIMA-LAD, SVG-RCA, SVG-OM2 b. STEMI s/p PCI to distal RCA lesion with a small Promus Premier stent. ( patent LIMA to the LAD, occluded SVG to OM, occluded SVG to RCA.)  . CAD (coronary artery disease) of artery bypass graft; occluded SVG to RCA and occluded SVG to OM 10/08/2015  . Empty sella ([place])   . Hidradenitis axillaris   . History of kidney stones   . Hyperlipemia   . Hypertension   . Kidney stones   . Lupus ([place]) dx'd 2008   "they think I have this; have to get retested" (06/12/2015)  . Obesity (BMI 30.0-34.9)   . Ovarian cyst   . Pneumonia X 2  . PONV (postoperative nausea and vomiting)   . Rheumatoid aortitis   . Rheumatoid arthritis ([place])   . S/P angioplasty with stent; 10/04/15 to distal RCA lesion. with Promus premier. 10/08/2015  . Scoliosis   . Sleep apnea    patient states "it is mild"  . Stroke [facility]) March 2014   left MCA branches; "they say I have them all the time", denies residual on 06/12/2015  . Tobacco abuse   . Type II diabetes mellitus ([place])     Past Surgical History:  Procedure Laterality Date  . ABDOMINAL HYSTERECTOMY    . BREAST BIOPSY Bilateral   . CARDIAC CATHETERIZATION N/A 06/13/2015  Procedure: Left Heart Cath and Coronary Angiography;  Surgeon: Peter M Martinique, MD;  Location: [place] [facility];  Service: Cardiovascular;  Laterality: N/A;  . CARDIAC CATHETERIZATION N/A 10/04/2015   Procedure: Left Heart Cath and Coronary Angiography;  Surgeon: [name], MD;  Location: [place] [facility];  Service: Cardiovascular;  Laterality: N/A;  . CARDIAC CATHETERIZATION N/A 10/04/2015   Procedure: Coronary Stent Intervention;  Surgeon: [name], MD;  Location: [place] [facility];  Service: Cardiovascular;  Laterality: N/A;  . CORONARY ARTERY BYPASS GRAFT N/A 06/19/2015   Procedure: CORONARY ARTERY BYPASS  GRAFTING (CABG) TIMES FOUR USING LEFT INTERNAL MAMMARY, RIGHT SAPHENOUS LEG VEIN AND CRYO SAPHENOUS VEIN;  Surgeon: [name], MD;  Location: [place];  Service: Open Heart Surgery;  Laterality: N/A;  . DILATION AND CURETTAGE OF UTERUS    . HYDRADENITIS EXCISION Right 12/20/2019   Procedure: EXCISION OF RIGHT  HIDRADENITIS AXILLARY;  Surgeon: Donnie Mesa, MD;  Location: [place];  Service: General;  Laterality: Right;  . IR URETERAL STENT LEFT NEW ACCESS W/O SEP NEPHROSTOMY CATH  01/10/2019  . NEPHROLITHOTOMY Left 01/10/2019   Procedure: NEPHROLITHOTOMY PERCUTANEOUS;  Surgeon: Kathie Rhodes, MD;  Location: [facility];  Service: Urology;  Laterality: Left;  . TEE WITHOUT CARDIOVERSION N/A 06/19/2015   Procedure: TRANSESOPHAGEAL ECHOCARDIOGRAM (TEE);  Surgeon: [name], MD;  Location: [place];  Service: Open Heart Surgery;  Laterality: N/A;  . TONSILLECTOMY  1960s  . TUBAL LIGATION       Current Outpatient Medications  Medication Sig Dispense Refill  . aspirin EC 81 MG EC tablet Take 1 tablet (81 mg total) by mouth daily.    Marland Kitchen atorvastatin (LIPITOR) 80 MG tablet TAKE 1 TABLET EVERY DAY  AT  6PM (Patient taking differently: Take 80 mg by mouth daily at 6 PM.) 90 tablet 6  . Biotin w/ Vitamins C & E (HAIR/SKIN/NAILS PO) Take 1-2 tablets by mouth See admin instructions. Take 1 tablet in the morning & take 2 tablets in the evening.    . cetirizine (ZYRTEC) 10 MG tablet Take 10 mg by mouth daily.  2  . cholecalciferol (VITAMIN D) 25 MCG (1000 UNIT) tablet Take 1,000 Units by mouth in the morning and at bedtime.    . famotidine (PEPCID) 20 MG tablet Take 20 mg by mouth at bedtime as needed for heartburn or indigestion.    Marland Kitchen guaiFENesin (MUCINEX) 600 MG 12 hr tablet Take 600 mg by mouth 2 (two) times daily as needed (congestion.).    Marland Kitchen liraglutide (VICTOZA) 18 MG/3ML SOPN Inject 1.8 mg into the skin at bedtime.     . metFORMIN (GLUCOPHAGE) 1000 MG tablet Take 1,000 mg by mouth 2 (two) times daily.     . metoprolol tartrate (LOPRESSOR) 25 MG tablet Take one and a half (1.5) tablets (37.5 mg) by mouth twice (2) daily. (Patient taking differently: Take 37.5 mg by mouth 2 (two) times daily.) 270 tablet 1  . Multiple Vitamin (MULTIVITAMIN WITH MINERALS) TABS tablet Take 1 tablet by mouth daily. Centrum Silver    . nitroGLYCERIN (NITROSTAT) 0.4 MG SL tablet DISSOLVE 1 TABLET UNDER THE TONGUE EVERY 5 MINUTES AS  NEEDED FOR CHEST PAIN. MAX  OF 3 TABLETS IN 15 MINUTES. CALL 911 IF PAIN PERSISTS. (Patient taking differently: Place 0.4 mg under the tongue every 5 (five) minutes as needed for chest pain.) 100 tablet 3  . Potassium Citrate 15 MEQ (1620 MG) TBCR Take 15 mg by mouth in  the morning and at bedtime.    Marland Kitchen amLODipine (NORVASC) 5 MG tablet Take 1 tablet (5 mg total) by mouth daily. 90 tablet 3  . Evolocumab with Infusor ([place]) 420 MG/3.5ML SOCT Inject 420 mg into the skin every 30 (thirty) days. 3.6 mL 11  . isosorbide mononitrate (IMDUR) 30 MG 24 hr tablet Take 1 tablet (30 mg total) by mouth daily. 90 tablet 3   No current facility-administered medications for this visit.    Allergies:   Other    Social History:  The patient  reports that she quit smoking about 5 years ago. Her smoking use included cigarettes. She has a 18.00 pack-year smoking history. She has never used smokeless tobacco. She reports that she does not drink alcohol and does not use drugs.   Family History:  The patient's  family history includes Alzheimer's disease in an other family member; Breast cancer in her mother and another family member; Depression in her son; Diabetes in her mother and another family member; Heart failure in her mother; Stroke in her maternal aunt.    ROS:  Please see the history of present illness.   Otherwise, review of systems are positive for none.   All other systems are reviewed and negative.    PHYSICAL EXAM: VS:  BP 140/90 (BP Location: Left Arm)   Pulse 80   Ht 5'  6" (1.676 m)   Wt 224 lb (101.6 kg)   SpO2 94%   BMI 36.15 kg/m  , BMI Body mass index is 36.15 kg/m. GEN: Well nourished, well developed, in no acute distress HEENT: sclera anicteric Neck: no JVD, carotid bruits, or masses Cardiac: RRR; no murmurs, rubs, or gallops,no edema  Respiratory:  clear to auscultation bilaterally, normal work of breathing GI: soft, nontender, nondistended, + BS MS: no deformity or atrophy Skin: warm and dry, no rash Neuro:  Strength and sensation are intact Psych: euthymic mood, full affect   EKG:  EKG is not ordered today.   Recent Labs: 10/03/2019: ALT 21 04/06/2020: B Natriuretic Peptide 187.5; Hemoglobin 14.9; Platelets 268 04/07/2020: BUN 14; Creatinine, Ser 0.95; Potassium 4.1; Sodium 142    Lipid Panel    Component Value Date/Time   CHOL 128 10/03/2019 1455   TRIG 211 (H) 10/03/2019 1455   HDL 63 10/03/2019 1455   CHOLHDL 2.0 10/03/2019 1455   CHOLHDL 2.9 10/05/2015 0305   VLDL 28 10/05/2015 0305   LDLCALC 32 10/03/2019 1455      Wt Readings from Last 3 Encounters:  04/26/20 224 lb (101.6 kg)  04/07/20 222 lb 8 oz (100.9 kg)  12/20/19 219 lb 9.3 oz (99.6 kg)      Other studies Reviewed: Additional studies/ records that were reviewed today include:   Echocardiogram 02/2017: - Left ventricle: The cavity size was normal. Wall thickness was  increased in a pattern of mild LVH. Systolic function was mildly  reduced. The estimated ejection fraction was in the range of 45%  to 50%. Severe inferior hypokinesis to akinesis. Doppler  parameters are consistent with abnormal left ventricular  relaxation (grade 1 diastolic dysfunction). The E/e&' ratio is  between 8-15, suggesting indeterminate LV filling pressure.  - Aortic valve: Trileaflet. Sclerosis without stenosis. There was  no regurgitation.  - Mitral valve: Mildly thickened leaflets . There was trivial  regurgitation.  - Left atrium: The atrium was normal in  size.  - Inferior vena cava: The vessel was normal in size. The  respirophasic diameter changes were  in the normal range (>= 50%),  consistent with normal central venous pressure.   Impressions:   - Compared to a prior study in 09/2015, the LVEF is lower at 45-50%  - global hypokinesis with inferior wall hypokinesis is noted.   LHC 2017:  Prox LAD lesion, 85% stenosed. Patent LIMA to LAD.  Severe native 3 vessel CAD.  Lat 1st Mrg-2 lesion, 100% stenosed. Occluded SVG to OM.  Ost RCA to Mid RCA lesion, 70% stenosed. Ocluded SVG to RCA,  Dist RCA lesion, 99% stenosed. Post intervention with a 2.25 x 12 Promus Premier, there is a 0% residual stenosis.  Normal LVEDP.   Switch to Brilinta from Plavix.  She needs aggressive secondary prevention.  Patient with residual ST elevation in the inferior leads but her chest pain has improved significantly.  Proximal RCA disease did not seem critical, compared to distal lesion.   Diagnostic Dominance: Right    Intervention      ASSESSMENT AND PLAN:  1. CAD s/p CABG in 2017: she had early graft failure with subsequent PCI/DES to LAD in 2017. No anginal complaints since discharge. She attributed her recent chest pain to reflux/indigestion - Continue aspirin and statin - Continue metoprolol and imdur  2. HTN: BP is 140/90 today, though she has not taken her medications yet. She is happy with BP trend at home which is often in the 130s.  - Will continue current medications - amlodipine 5mg  daily, metoprolol 37.5mg  BID, and imdur 30mg  daily.  - Patient will continue to monitor BP at home and notify the office is persistently >140/90 at which time I would consider transition from metoprolol to carvedilol for improved BP effects rather than add an additional medication No med changes/tests/labs - will follow-up with Dr. Martinique in 3 months  3. HLD: LDL 32 09/2019 - Continue atorvastatin and repatha  4. DM type 2: A1C 7.4 03/2020 -  Continue metformin and victoza per PCP   Current medicines are reviewed at length with the patient today.  The patient does not have concerns regarding medicines.  The following changes have been made:  As above  Labs/ tests ordered today include:  No orders of the defined types were placed in this encounter.    Disposition:   FU with Dr. Martinique in 3 months  Signed, Abigail Butts, [name]  05/03/2020 7:04 AM

## 2020-05-03 ENCOUNTER — Encounter: Payer: Self-pay | Admitting: Medical

## 2020-05-09 NOTE — Telephone Encounter (Signed)
Error

## 2020-05-11 DIAGNOSIS — E119 Type 2 diabetes mellitus without complications: Secondary | ICD-10-CM | POA: Diagnosis not present

## 2020-05-29 DIAGNOSIS — L409 Psoriasis, unspecified: Secondary | ICD-10-CM | POA: Diagnosis not present

## 2020-06-07 ENCOUNTER — Encounter (HOSPITAL_COMMUNITY): Payer: Self-pay | Admitting: *Deleted

## 2020-06-07 ENCOUNTER — Emergency Department (HOSPITAL_COMMUNITY): Payer: Medicare HMO

## 2020-06-07 ENCOUNTER — Emergency Department (HOSPITAL_COMMUNITY)
Admission: EM | Admit: 2020-06-07 | Discharge: 2020-06-07 | Disposition: A | Discharge status: Home / Self Care | Payer: Medicare HMO | Attending: Emergency Medicine | Admitting: Emergency Medicine

## 2020-06-07 ENCOUNTER — Other Ambulatory Visit: Payer: Self-pay

## 2020-06-07 DIAGNOSIS — Z7982 Long term (current) use of aspirin: Secondary | ICD-10-CM | POA: Insufficient documentation

## 2020-06-07 DIAGNOSIS — Z951 Presence of aortocoronary bypass graft: Secondary | ICD-10-CM | POA: Diagnosis not present

## 2020-06-07 DIAGNOSIS — N201 Calculus of ureter: Secondary | ICD-10-CM | POA: Diagnosis not present

## 2020-06-07 DIAGNOSIS — Z7984 Long term (current) use of oral hypoglycemic drugs: Secondary | ICD-10-CM | POA: Insufficient documentation

## 2020-06-07 DIAGNOSIS — I110 Hypertensive heart disease with heart failure: Secondary | ICD-10-CM | POA: Insufficient documentation

## 2020-06-07 DIAGNOSIS — I5033 Acute on chronic diastolic (congestive) heart failure: Secondary | ICD-10-CM | POA: Diagnosis not present

## 2020-06-07 DIAGNOSIS — Z79899 Other long term (current) drug therapy: Secondary | ICD-10-CM | POA: Diagnosis not present

## 2020-06-07 DIAGNOSIS — I2510 Atherosclerotic heart disease of native coronary artery without angina pectoris: Secondary | ICD-10-CM | POA: Insufficient documentation

## 2020-06-07 DIAGNOSIS — N132 Hydronephrosis with renal and ureteral calculous obstruction: Secondary | ICD-10-CM | POA: Insufficient documentation

## 2020-06-07 DIAGNOSIS — N83202 Unspecified ovarian cyst, left side: Secondary | ICD-10-CM | POA: Diagnosis not present

## 2020-06-07 DIAGNOSIS — R319 Hematuria, unspecified: Secondary | ICD-10-CM

## 2020-06-07 DIAGNOSIS — N200 Calculus of kidney: Secondary | ICD-10-CM | POA: Diagnosis not present

## 2020-06-07 DIAGNOSIS — Z87891 Personal history of nicotine dependence: Secondary | ICD-10-CM | POA: Diagnosis not present

## 2020-06-07 DIAGNOSIS — Z8673 Personal history of transient ischemic attack (TIA), and cerebral infarction without residual deficits: Secondary | ICD-10-CM | POA: Diagnosis not present

## 2020-06-07 DIAGNOSIS — M47819 Spondylosis without myelopathy or radiculopathy, site unspecified: Secondary | ICD-10-CM | POA: Diagnosis not present

## 2020-06-07 DIAGNOSIS — E119 Type 2 diabetes mellitus without complications: Secondary | ICD-10-CM | POA: Diagnosis not present

## 2020-06-07 DIAGNOSIS — K5730 Diverticulosis of large intestine without perforation or abscess without bleeding: Secondary | ICD-10-CM | POA: Diagnosis not present

## 2020-06-07 DIAGNOSIS — R7989 Other specified abnormal findings of blood chemistry: Secondary | ICD-10-CM | POA: Diagnosis not present

## 2020-06-07 DIAGNOSIS — R109 Unspecified abdominal pain: Secondary | ICD-10-CM | POA: Diagnosis present

## 2020-06-07 LAB — BASIC METABOLIC PANEL
Anion gap: 12 (ref 5–15)
BUN: 30 mg/dL — ABNORMAL HIGH (ref 8–23)
CO2: 23 mmol/L (ref 22–32)
Calcium: 9.4 mg/dL (ref 8.9–10.3)
Chloride: 101 mmol/L (ref 98–111)
Creatinine, Ser: 1.7 mg/dL — ABNORMAL HIGH (ref 0.44–1.00)
GFR, Estimated: 34 mL/min — ABNORMAL LOW (ref >60)
Glucose, Bld: 217 mg/dL — ABNORMAL HIGH (ref 70–99)
Potassium: 4 mmol/L (ref 3.5–5.1)
Sodium: 136 mmol/L (ref 135–145)

## 2020-06-07 LAB — URINALYSIS, ROUTINE W REFLEX MICROSCOPIC
Bacteria, UA: NONE SEEN
Bilirubin Urine: NEGATIVE
Glucose, UA: NEGATIVE mg/dL
Ketones, ur: NEGATIVE mg/dL
Nitrite: NEGATIVE
Protein, ur: 30 mg/dL — AB
RBC / HPF: >50 RBC/hpf — ABNORMAL HIGH (ref 0–5)
Specific Gravity, Urine: 1.02 (ref 1.005–1.030)
pH: 5 (ref 5.0–8.0)

## 2020-06-07 LAB — CBC WITH DIFFERENTIAL/PLATELET
Abs Immature Granulocytes: 0.06 10*3/uL (ref 0.00–0.07)
Basophils Absolute: 0 10*3/uL (ref 0.0–0.1)
Basophils Relative: 1 %
Eosinophils Absolute: 0.1 10*3/uL (ref 0.0–0.5)
Eosinophils Relative: 1 %
HCT: 51.1 % — ABNORMAL HIGH (ref 36.0–46.0)
Hemoglobin: 17 g/dL — ABNORMAL HIGH (ref 12.0–15.0)
Immature Granulocytes: 1 %
Lymphocytes Relative: 16 %
Lymphs Abs: 1.3 10*3/uL (ref 0.7–4.0)
MCH: 29.3 pg (ref 26.0–34.0)
MCHC: 33.3 g/dL (ref 30.0–36.0)
MCV: 88 fL (ref 80.0–100.0)
Monocytes Absolute: 1 10*3/uL (ref 0.1–1.0)
Monocytes Relative: 12 %
Neutro Abs: 5.6 10*3/uL (ref 1.7–7.7)
Neutrophils Relative %: 69 %
Platelets: 273 10*3/uL (ref 150–400)
RBC: 5.81 MIL/uL — ABNORMAL HIGH (ref 3.87–5.11)
RDW: 14.2 % (ref 11.5–15.5)
WBC: 8 10*3/uL (ref 4.0–10.5)
nRBC: 0 % (ref 0.0–0.2)

## 2020-06-07 MED ORDER — OXYCODONE-ACETAMINOPHEN 5-325 MG PO TABS: 1.0000 | ORAL_TABLET | ORAL | 0 refills | Status: DC | PRN

## 2020-06-07 MED ORDER — TAMSULOSIN HCL 0.4 MG PO CAPS: 0.4000 mg | ORAL_CAPSULE | Freq: Every day | ORAL | 0 refills | Status: DC

## 2020-06-07 MED ORDER — ONDANSETRON 4 MG PO TBDP: 4.0000 mg | ORAL_TABLET | Freq: Three times a day (TID) | ORAL | 0 refills | Status: DC | PRN

## 2020-06-07 MED ORDER — ONDANSETRON HCL 4 MG/2ML IJ SOLN
4.0000 mg | Freq: Once | INTRAMUSCULAR | Status: AC
  Administered 2020-06-07: 4 mg via INTRAVENOUS
  Filled 2020-06-07: qty 2

## 2020-06-07 MED ORDER — HYDROMORPHONE HCL 1 MG/ML IJ SOLN
1.0000 mg | Freq: Once | INTRAMUSCULAR | Status: AC
  Administered 2020-06-07: 1 mg via INTRAVENOUS
  Filled 2020-06-07: qty 1

## 2020-06-07 NOTE — Discharge Instructions (Signed)
As we discussed, your CT did show a stone today.  Take the prescribed medication as directed.  Do not drive while taking pain medication. Follow-up with urology-- since you were seen in the ED tonight, I would call them this morning to see if they still want you to come in today or wait until Monday.  They will be able to see your labs and CT from this visit. Return to the ED for new or worsening symptoms.

## 2020-06-07 NOTE — ED Provider Notes (Signed)
[facility] EMERGENCY DEPARTMENT Provider Note   CSN: [id number] Arrival date & time: 06/07/20  0247     History Chief Complaint  Patient presents with  . Nephrolithiasis    [name] [name] is a 60 y.o. female.  The history is provided by the patient and medical records.    60 y.o. F with hx of anxiety, kidney stones, CAD, lupus, HTN, HLP, prior stroke, presenting to the ED with left flank pain.  States this initially started on Monday, was severe but subsided some.  She was able to work Wednesday but upon returning home she was in agony and has since persisted.  She has had some intermittent difficulty urinating.  No frank hematuria.  No fevers.  Some nausea and loss of appetite.  States she did call and make an appt with urology for later today but could not wait until then.  Hx of stones in the past which she reports feels similar.  Has required surgical retrieval in the past due to large size of stones.  Past Medical History:  Diagnosis Date  . Acute ST elevation myocardial infarction (STEMI) involving right coronary artery ([place]) 10/04/2015  . Anxiety   . Anxiety and depression   . CAD (coronary artery disease)    a. CABG 06/2015: LIMA-LAD, SVG-RCA, SVG-OM2 b. STEMI s/p PCI to distal RCA lesion with a small Promus Premier stent. ( patent LIMA to the LAD, occluded SVG to OM, occluded SVG to RCA.)  . CAD (coronary artery disease) of artery bypass graft; occluded SVG to RCA and occluded SVG to OM 10/08/2015  . Empty sella ([place])   . Hidradenitis axillaris   . History of kidney stones   . Hyperlipemia   . Hypertension   . Kidney stones   . Lupus ([place]) dx'd 2008   "they think I have this; have to get retested" (06/12/2015)  . Obesity (BMI 30.0-34.9)   . Ovarian cyst   . Pneumonia X 2  . PONV (postoperative nausea and vomiting)   . Rheumatoid aortitis   . Rheumatoid arthritis ([place])   . S/P angioplasty with stent; 10/04/15 to distal RCA lesion. with Promus  premier. 10/08/2015  . Scoliosis   . Sleep apnea    patient states "it is mild"  . Stroke [facility]) March 2014   left MCA branches; "they say I have them all the time", denies residual on 06/12/2015  . Tobacco abuse   . Type II diabetes mellitus [facility])     Patient Active Problem List   Diagnosis Date Noted  . Chest pain 04/06/2020  . Coronary artery disease   . Gastroesophageal reflux disease without esophagitis   . Renal calculus, left 01/10/2019  . Chest pain in adult 03/02/2017  . Hypoxia   . Acute on chronic diastolic CHF (congestive heart failure) ([place])   . Hx of CABG   . S/P angioplasty with stent; 10/04/15 to distal RCA lesion. with Promus premier. 10/08/2015  . History of ST elevation myocardial infarction (STEMI) 10/04/2015  . Superficial incisional surgical site infection, medial left lower extremity 07/10/2015  . Hyperlipidemia with target LDL less than 70 06/13/2015  . Chest pain with moderate risk of acute coronary syndrome   . Tobacco abuse   . Type 2 diabetes mellitus, uncontrolled ([place]) 06/04/2012  . Lupus ([place]) 06/03/2012  . History of stroke 06/03/2012  . Empty sella syndrome ([place]) 06/03/2012  . Accelerated hypertension 06/03/2012  . Rheumatoid arthritis ([place]) 06/03/2012  . Hidradenitis axillaris 06/03/2012  .  Obesity (BMI 30.0-34.9)     Past Surgical History:  Procedure Laterality Date  . ABDOMINAL HYSTERECTOMY    . BREAST BIOPSY Bilateral   . CARDIAC CATHETERIZATION N/A 06/13/2015   Procedure: Left Heart Cath and Coronary Angiography;  Surgeon: Peter M Martinique, MD;  Location: [facility] CV LAB;  Service: Cardiovascular;  Laterality: N/A;  . CARDIAC CATHETERIZATION N/A 10/04/2015   Procedure: Left Heart Cath and Coronary Angiography;  Surgeon: [name], MD;  Location: [facility] CV LAB;  Service: Cardiovascular;  Laterality: N/A;  . CARDIAC CATHETERIZATION N/A 10/04/2015   Procedure: Coronary Stent Intervention;  Surgeon: [name], MD;   Location: [place] CV LAB;  Service: Cardiovascular;  Laterality: N/A;  . CORONARY ARTERY BYPASS GRAFT N/A 06/19/2015   Procedure: CORONARY ARTERY BYPASS GRAFTING (CABG) TIMES FOUR USING LEFT INTERNAL MAMMARY, RIGHT SAPHENOUS LEG VEIN AND CRYO SAPHENOUS VEIN;  Surgeon: [name], MD;  Location: [facility];  Service: Open Heart Surgery;  Laterality: N/A;  . DILATION AND CURETTAGE OF UTERUS    . HYDRADENITIS EXCISION Right 12/20/2019   Procedure: EXCISION OF RIGHT  HIDRADENITIS AXILLARY;  Surgeon: Donnie Mesa, MD;  Location: [facility];  Service: General;  Laterality: Right;  . IR URETERAL STENT LEFT NEW ACCESS W/O SEP NEPHROSTOMY CATH  01/10/2019  . NEPHROLITHOTOMY Left 01/10/2019   Procedure: NEPHROLITHOTOMY PERCUTANEOUS;  Surgeon: Kathie Rhodes, MD;  Location: [facility];  Service: Urology;  Laterality: Left;  . TEE WITHOUT CARDIOVERSION N/A 06/19/2015   Procedure: TRANSESOPHAGEAL ECHOCARDIOGRAM (TEE);  Surgeon: [name], MD;  Location: [facility];  Service: Open Heart Surgery;  Laterality: N/A;  . TONSILLECTOMY  1960s  . TUBAL LIGATION       OB History   No obstetric history on file.     Family History  Problem Relation Age of Onset  . Diabetes Mother   . Heart failure Mother   . Breast cancer Mother   . Stroke Maternal Aunt   . Diabetes Other        maternal  . Breast cancer Other   . Alzheimer's disease Other        maternal  . Depression Son     Social History   Tobacco Use  . Smoking status: Former Smoker    Packs/day: 0.50    Years: 36.00    Pack years: 18.00    Types: Cigarettes    Quit date: 06/12/2014    Years since quitting: 5.9  . Smokeless tobacco: Never Used  Vaping Use  . Vaping Use: Never used  Substance Use Topics  . Alcohol use: No    Alcohol/week: 0.0 standard drinks  . Drug use: No    Home Medications Prior to Admission medications   Medication Sig Start Date End Date Taking? Authorizing Provider  amLODipine (NORVASC) 5 MG tablet Take 1 tablet (5  mg total) by mouth daily. 04/26/20   Kroeger, Lorelee Cover., [name]  aspirin EC 81 MG EC tablet Take 1 tablet (81 mg total) by mouth daily. 06/25/15   Nani Skillern, [name]  atorvastatin (LIPITOR) 80 MG tablet TAKE 1 TABLET EVERY DAY  AT  6PM Patient taking differently: Take 80 mg by mouth daily at 6 PM. 05/29/17   Martinique, Peter M, MD  Biotin w/ Vitamins C & E (HAIR/SKIN/NAILS PO) Take 1-2 tablets by mouth See admin instructions. Take 1 tablet in the morning & take 2 tablets in the evening.    [provider]  cetirizine (ZYRTEC) 10  MG tablet Take 10 mg by mouth daily. 02/18/17   [provider]  cholecalciferol (VITAMIN D) 25 MCG (1000 UNIT) tablet Take 1,000 Units by mouth in the morning and at bedtime.    [provider]  Evolocumab with Infusor ([place]) 420 MG/3.5ML SOCT Inject 420 mg into the skin every 30 (thirty) days. 04/26/20   Kroeger, Lorelee Cover., [name]  famotidine (PEPCID) 20 MG tablet Take 20 mg by mouth at bedtime as needed for heartburn or indigestion.    [provider]  guaiFENesin (MUCINEX) 600 MG 12 hr tablet Take 600 mg by mouth 2 (two) times daily as needed (congestion.).    [provider]  isosorbide mononitrate (IMDUR) 30 MG 24 hr tablet Take 1 tablet (30 mg total) by mouth daily. 04/26/20   Kroeger, Lorelee Cover., [name]  liraglutide (VICTOZA) 18 MG/3ML SOPN Inject 1.8 mg into the skin at bedtime.     [provider]  metFORMIN (GLUCOPHAGE) 1000 MG tablet Take 1,000 mg by mouth 2 (two) times daily. 04/06/15   [provider]  metoprolol tartrate (LOPRESSOR) 25 MG tablet Take one and a half (1.5) tablets (37.5 mg) by mouth twice (2) daily. Patient taking differently: Take 37.5 mg by mouth 2 (two) times daily. 09/15/17   Martinique, Peter M, MD  Multiple Vitamin (MULTIVITAMIN WITH MINERALS) TABS tablet Take 1 tablet by mouth daily. Centrum Silver    [provider]  nitroGLYCERIN (NITROSTAT) 0.4 MG SL tablet  DISSOLVE 1 TABLET UNDER THE TONGUE EVERY 5 MINUTES AS  NEEDED FOR CHEST PAIN. MAX  OF 3 TABLETS IN 15 MINUTES. CALL 911 IF PAIN PERSISTS. Patient taking differently: Place 0.4 mg under the tongue every 5 (five) minutes as needed for chest pain. 11/15/18   Martinique, Peter M, MD  Potassium Citrate 15 MEQ (1620 MG) TBCR Take 15 mg by mouth in the morning and at bedtime. 10/05/19   [provider]    Allergies    Other  Review of Systems   Review of Systems  Genitourinary: Positive for flank pain.  All other systems reviewed and are negative.   Physical Exam Updated Vital Signs BP (!) 152/95 (BP Location: Right Arm)   Pulse 85   Temp 98.9 F (37.2 C) (Oral)   Resp 17   SpO2 97%   Physical Exam Vitals and nursing note reviewed.  Constitutional:      Appearance: She is well-developed.     Comments: Writhing in pain, uncomfortable  HENT:     Head: Normocephalic and atraumatic.  Eyes:     Conjunctiva/sclera: Conjunctivae normal.     Pupils: Pupils are equal, round, and reactive to light.  Cardiovascular:     Rate and Rhythm: Normal rate and regular rhythm.     Heart sounds: Normal heart sounds.  Pulmonary:     Effort: Pulmonary effort is normal.     Breath sounds: Normal breath sounds.  Abdominal:     General: Bowel sounds are normal.     Palpations: Abdomen is soft.  Musculoskeletal:        General: Normal range of motion.     Cervical back: Normal range of motion.  Skin:    General: Skin is warm and dry.  Neurological:     Mental Status: She is alert and oriented to person, place, and time.     ED Results / Procedures / Treatments   Labs (all labs ordered are listed, but only abnormal results are displayed) Labs Reviewed  CBC WITH DIFFERENTIAL/PLATELET - Abnormal; Notable for the following components:      Result Value   RBC 5.81 (*)    Hemoglobin 17.0 (*)    HCT 51.1 (*)    All other components within normal limits  BASIC METABOLIC PANEL - Abnormal;  Notable for the following components:   Glucose, Bld 217 (*)    BUN 30 (*)    Creatinine, Ser 1.70 (*)    GFR, Estimated 34 (*)    All other components within normal limits  URINALYSIS, ROUTINE W REFLEX MICROSCOPIC - Abnormal; Notable for the following components:   APPearance TURBID (*)    Hgb urine dipstick LARGE (*)    Protein, ur 30 (*)    Leukocytes,Ua TRACE (*)    RBC / HPF >50 (*)    All other components within normal limits    EKG None  Radiology CT Renal Stone Study  Result Date: 06/07/2020 CLINICAL DATA:  60 year old female with flank pain. Concern for kidney stone. EXAM: CT ABDOMEN AND PELVIS WITHOUT CONTRAST TECHNIQUE: Multidetector CT imaging of the abdomen and pelvis was performed following the standard protocol without IV contrast. COMPARISON:  CT abdomen pelvis dated 01/10/2019. FINDINGS: Evaluation of this exam is limited in the absence of intravenous contrast. Lower chest: The visualized lung bases are clear. There is coronary vascular calcification. No intra-abdominal free air or free fluid. Hepatobiliary: Enlarged fatty liver with slight surface irregularity which may represent early changes of cirrhosis. Correlation with LFTs recommended to evaluate for steatohepatitis. No intrahepatic ductal dilatation. The gallbladder is unremarkable. Pancreas: Unremarkable. No pancreatic ductal dilatation or surrounding inflammatory changes. Spleen: Normal in size without focal abnormality. Adrenals/Urinary Tract: The adrenal glands unremarkable. There is an 8 mm stone in the distal left ureter with mild left hydronephrosis. A 1 cm nonobstructing left renal inferior pole stones also noted. The right kidney is unremarkable. The right ureter and the urinary bladder appear unremarkable. Stomach/Bowel: There are scattered colonic diverticula without active inflammatory changes. There is no bowel obstruction or active inflammation. Normal appendix. Vascular/Lymphatic: Moderate aortoiliac  atherosclerotic disease. The IVC is unremarkable. No portal venous gas. There is no adenopathy. Reproductive: Hysterectomy. There is a 3 cm left ovarian cyst. No follow-up imaging recommended. note: This recommendation does not apply to premenarchal patients and to those with increased risk (genetic, family history, elevated tumor markers or other high-risk factors) of ovarian cancer. Reference: JACR 2020 Feb; 17(2):248-254 the right ovary is unremarkable. Other: None Musculoskeletal: Degenerative changes of the spine. No acute osseous pathology. IMPRESSION: 1. A 8 mm distal left ureteral stone with mild left hydronephrosis. Additional 1 cm nonobstructing left renal inferior pole stones also noted. 2. Enlarged fatty liver with slight surface irregularity which may represent early changes of cirrhosis. Correlation with LFTs recommended to evaluate for steatohepatitis. 3. Colonic diverticulosis. No bowel obstruction. Normal appendix. 4. Aortic Atherosclerosis (ICD10-I70.0). Electronically Signed   By: Anner Crete M.D.   On: 06/07/2020 03:48    Procedures Procedures   Medications Ordered in ED Medications  HYDROmorphone (DILAUDID) injection 1 mg (1 mg Intravenous Given 06/07/20 0350)  ondansetron (ZOFRAN) injection 4 mg (4 mg Intravenous Given 06/07/20 0349)    ED Course  I have reviewed the triage vital signs and the nursing notes.  Pertinent labs & imaging results that were available during my care of the patient were reviewed by me and considered in my medical decision making (see chart for details).    MDM Rules/Calculators/A&P  60 y.o. F here  with left sided flank pain.  Hx of stones and states it feels similar.  She is writhing in bed, appears very uncomfortable.  High suspicion for stone.  Will get labs, CT renal study.  Given pain and nausea medications.  4:47 AM Patient reports total relief of pain at this time.  No further nausea/vomiting.  Labs reviewed-- SrCr is mildly elevated  today, likely combination of decreased PO intake and stone.  CT with 37mm stone in distal left ureter.  Patient drinking gingerale, awaiting UA.  6:15 AM UA without acute infection, blood as expected.  Patient remains pain free.  Feel she is stable for discharge.  She had urology appointment scheduled for today to evaluate her pain, however since she had full ED evaluation tonight she inquired whether or not she needed to go.  I have asked her to call the office and have physician review to determine if they want her to go ahead and come in today or wait a few days to see if stone will pass spontaneously.  She is agreeable.  Rx Percocet, Zofran, Flomax.  Encourage good oral hydration.  Return here for any new or acute changes.  Final Clinical Impression(s) / ED Diagnoses Final diagnoses:  Left ureteral stone  Hematuria, unspecified type    Rx / DC Orders ED Discharge Orders         Ordered    oxyCODONE-acetaminophen (PERCOCET) 5-325 MG tablet  Every 4 hours PRN        06/07/20 0612    ondansetron (ZOFRAN ODT) 4 MG disintegrating tablet  Every 8 hours PRN        06/07/20 0612    tamsulosin (FLOMAX) 0.4 MG CAPS capsule  Daily after supper        06/07/20 0612           Larene Pickett, [name] 20/25/42 [id number]    Delora Fuel, MD 37/62/83 [phone]

## 2020-06-07 NOTE — ED Triage Notes (Signed)
Pt with hx of kidney stones. Started having L sided flank pain Monday night,  Has had decreased appetite, NV. Denies urinary symptoms

## 2020-06-12 ENCOUNTER — Telehealth: Payer: Self-pay | Admitting: Cardiology

## 2020-06-12 ENCOUNTER — Other Ambulatory Visit: Payer: Self-pay | Admitting: Urology

## 2020-06-12 NOTE — Telephone Encounter (Signed)
   [facility] Pre-operative Risk Assessment    HEARTCARE STAFF: - Please ensure there is not already an duplicate clearance open for this procedure. - Under Visit Info/Reason for Call, type in Other and utilize the format Clearance MM/DD/YY or Clearance TBD. Do not use dashes or single digits. - If request is for dental extraction, please clarify the # of teeth to be extracted.  Request for surgical clearance:  1. What type of surgery is being performed? Ureterosocpy for Kidney Stone    2. When is this surgery scheduled? 06/21/20   3. What type of clearance is required (medical clearance vs. Pharmacy clearance to hold med vs. Both)? Both  4. Are there any medications that need to be held prior to surgery and how long? Aspirin 5 days prior    5. Practice name and name of physician performing surgery? Dr. Arnette Schaumann, [facility]   6. What is the office phone number? [phone]   7.   What is the office fax number? [phone]  8.   Anesthesia type (None, local, MAC, general) ? Choice    [name] [name] 06/12/2020, 4:42 PM  _________________________________________________________________   (provider comments below)

## 2020-06-13 DIAGNOSIS — N132 Hydronephrosis with renal and ureteral calculous obstruction: Secondary | ICD-10-CM | POA: Diagnosis not present

## 2020-06-13 DIAGNOSIS — I10 Essential (primary) hypertension: Secondary | ICD-10-CM | POA: Diagnosis not present

## 2020-06-13 DIAGNOSIS — Z7984 Long term (current) use of oral hypoglycemic drugs: Secondary | ICD-10-CM | POA: Diagnosis not present

## 2020-06-13 DIAGNOSIS — I2102 ST elevation (STEMI) myocardial infarction involving left anterior descending coronary artery: Secondary | ICD-10-CM | POA: Diagnosis not present

## 2020-06-13 DIAGNOSIS — I2510 Atherosclerotic heart disease of native coronary artery without angina pectoris: Secondary | ICD-10-CM | POA: Diagnosis not present

## 2020-06-13 DIAGNOSIS — I5033 Acute on chronic diastolic (congestive) heart failure: Secondary | ICD-10-CM | POA: Diagnosis not present

## 2020-06-13 DIAGNOSIS — E1165 Type 2 diabetes mellitus with hyperglycemia: Secondary | ICD-10-CM | POA: Diagnosis not present

## 2020-06-13 DIAGNOSIS — M059 Rheumatoid arthritis with rheumatoid factor, unspecified: Secondary | ICD-10-CM | POA: Diagnosis not present

## 2020-06-13 DIAGNOSIS — K219 Gastro-esophageal reflux disease without esophagitis: Secondary | ICD-10-CM | POA: Diagnosis not present

## 2020-06-13 DIAGNOSIS — E1121 Type 2 diabetes mellitus with diabetic nephropathy: Secondary | ICD-10-CM | POA: Diagnosis not present

## 2020-06-13 DIAGNOSIS — E785 Hyperlipidemia, unspecified: Secondary | ICD-10-CM | POA: Diagnosis not present

## 2020-06-13 NOTE — Telephone Encounter (Signed)
[name] [name]. Gauger 60 year old female is requesting ureteroscopy for kidney stone removal.  She was last seen in the clinic on 04/26/2020.  She had been admitted to the hospital 04/06/2020 until 04/07/2020.  During that time she was started on amlodipine and her Imdur was increased.  Her symptoms improved.  At the time of visit she had no further cardiac complaints.  She was trying to buy a house and reported that her blood pressure was well controlled at home.  She denied shortness of breath, palpitations, dizziness, lightheadedness, and syncope.  Her PMH includes coronary artery disease status post CABG in 2017, PCI with DES to distal RCA in 2017, hypertension, hyperlipidemia, type 2 diabetes, lupus, and psoriatic arthritis.  May her aspirin be held prior to her procedure?  Thank you for your help.  Please direct your response to CV DIV preop pool.  [name] [name]. [name] Arave NP-C    06/13/2020, 10:31 AM [place] [address] Office [phone] Fax [phone]

## 2020-06-14 NOTE — Telephone Encounter (Signed)
   Primary Cardiologist: [name], MD  Chart reviewed as part of pre-operative protocol coverage. Given past medical history and time since last visit, based on ACC/AHA guidelines, [name] [name] would be at acceptable risk for the planned procedure without further cardiovascular testing.   Her aspirin may be held for 5 days prior to her procedure.  Please resume as soon as hemostasis is achieved at the discretion of the surgeon.  I will route this recommendation to the requesting party via Epic fax function and remove from pre-op pool.  Please call with questions.  Jossie Ng. Bryar Rennie NP-C    06/14/2020, 7:31 AM [place] [address] Office [phone] Fax [phone]

## 2020-06-14 NOTE — Telephone Encounter (Signed)
Yes ASA can be held for procedure  [name] Bordas Martinique MD, [facility]

## 2020-06-15 ENCOUNTER — Other Ambulatory Visit: Payer: Self-pay

## 2020-06-15 ENCOUNTER — Encounter (HOSPITAL_BASED_OUTPATIENT_CLINIC_OR_DEPARTMENT_OTHER): Payer: Self-pay | Admitting: Urology

## 2020-06-15 DIAGNOSIS — L409 Psoriasis, unspecified: Secondary | ICD-10-CM

## 2020-06-15 HISTORY — DX: Psoriasis, unspecified: L40.9

## 2020-06-15 NOTE — Progress Notes (Addendum)
Spoke w/ via phone for pre-op interview---pt Lab needs dos----    I stat            Lab results------see below COVID test ------06-18-2020 1400 Arrive at -------945 am 06-21-2020 NPO after MN NO Solid Food.  Clear liquids from MN until---845 am then npo Med rec completed Medications to take morning of surgery -----amlodipine, isosorbide mononitrate, certrizine, metoprolol tartrate, pepcid Diabetic medication ----- Patient instructed to bring photo id and insurance card day of surgery Patient aware to have Driver (ride ) / caregiver  Son or girlfriend will drop pt off and pick pt up   for 24 hours after surgery  Patient Special Instructions -----none Pre-Op special Istructions -----none Patient verbalized understanding of instructions that were given at this phone interview. Patient denies shortness of breath, chest pain, fever, cough at this phone interview.  Anesthesia Review:  Hx cabg 2017, pci with des to distal rca 2017, htn, hyperlipidemia, type 2 dm, lupus, psoriatic arthritis, nitro last used 04-06-2020 with er visit for chest pain no chest pain since per pt  PCP: rupashree varadarjan Cardiologist : dr [name] cardiac clearance jesse cleaner np 06-13-2020 chart/epic, [facility] kroeger pa2-12-2020 epic Chest x-ray :1 view 04-06-2020 EKG : 04-06-2020 epci Echo :03-03-2017 epic Stress test:none Cardiac Cath : 10-04-2015 epic Activity level: works, does own house work can climb stairs without problems Sleep Study/ CPAP : Fasting Blood Sugar : 90-130     / Checks Blood Sugar - 3 x week  Blood Thinner/ Instructions /Last Dose:n/a ASA / Instructions/ Last Dose : note to sytop 81 mg aspirin jesse cleaver np 06-13-2020, pt aware last dose will be 06-15-2020

## 2020-06-18 ENCOUNTER — Other Ambulatory Visit (HOSPITAL_COMMUNITY)
Admission: RE | Admit: 2020-06-18 | Discharge: 2020-06-18 | Disposition: A | Discharge status: Home / Self Care | Payer: Medicare HMO | Source: Ambulatory Visit | Attending: Urology | Admitting: Urology

## 2020-06-18 DIAGNOSIS — Z01812 Encounter for preprocedural laboratory examination: Secondary | ICD-10-CM | POA: Insufficient documentation

## 2020-06-18 DIAGNOSIS — Z20822 Contact with and (suspected) exposure to covid-19: Secondary | ICD-10-CM | POA: Insufficient documentation

## 2020-06-19 LAB — SARS CORONAVIRUS 2 (TAT 6-24 HRS): SARS Coronavirus 2: NEGATIVE

## 2020-06-21 ENCOUNTER — Ambulatory Visit (HOSPITAL_BASED_OUTPATIENT_CLINIC_OR_DEPARTMENT_OTHER): Payer: Medicare HMO | Admitting: Anesthesiology

## 2020-06-21 ENCOUNTER — Ambulatory Visit (HOSPITAL_BASED_OUTPATIENT_CLINIC_OR_DEPARTMENT_OTHER)
Admission: RE | Admit: 2020-06-21 | Discharge: 2020-06-21 | Disposition: A | Discharge status: Home / Self Care | Payer: Medicare HMO | Attending: Urology | Admitting: Urology

## 2020-06-21 ENCOUNTER — Encounter (HOSPITAL_BASED_OUTPATIENT_CLINIC_OR_DEPARTMENT_OTHER)
Admission: RE | Disposition: A | Discharge status: Home / Self Care | Payer: Self-pay | Source: Home / Self Care | Attending: Urology

## 2020-06-21 ENCOUNTER — Encounter (HOSPITAL_BASED_OUTPATIENT_CLINIC_OR_DEPARTMENT_OTHER): Payer: Self-pay | Admitting: Urology

## 2020-06-21 DIAGNOSIS — I252 Old myocardial infarction: Secondary | ICD-10-CM | POA: Diagnosis not present

## 2020-06-21 DIAGNOSIS — Z599 Problem related to housing and economic circumstances, unspecified: Secondary | ICD-10-CM | POA: Diagnosis not present

## 2020-06-21 DIAGNOSIS — Z951 Presence of aortocoronary bypass graft: Secondary | ICD-10-CM | POA: Insufficient documentation

## 2020-06-21 DIAGNOSIS — I110 Hypertensive heart disease with heart failure: Secondary | ICD-10-CM | POA: Diagnosis not present

## 2020-06-21 DIAGNOSIS — I5033 Acute on chronic diastolic (congestive) heart failure: Secondary | ICD-10-CM | POA: Diagnosis not present

## 2020-06-21 DIAGNOSIS — Z87442 Personal history of urinary calculi: Secondary | ICD-10-CM | POA: Insufficient documentation

## 2020-06-21 DIAGNOSIS — N135 Crossing vessel and stricture of ureter without hydronephrosis: Secondary | ICD-10-CM | POA: Diagnosis not present

## 2020-06-21 DIAGNOSIS — N202 Calculus of kidney with calculus of ureter: Secondary | ICD-10-CM | POA: Diagnosis not present

## 2020-06-21 DIAGNOSIS — N200 Calculus of kidney: Secondary | ICD-10-CM | POA: Insufficient documentation

## 2020-06-21 DIAGNOSIS — Z87891 Personal history of nicotine dependence: Secondary | ICD-10-CM | POA: Insufficient documentation

## 2020-06-21 DIAGNOSIS — Z955 Presence of coronary angioplasty implant and graft: Secondary | ICD-10-CM | POA: Diagnosis not present

## 2020-06-21 DIAGNOSIS — N201 Calculus of ureter: Secondary | ICD-10-CM | POA: Diagnosis not present

## 2020-06-21 HISTORY — PX: CYSTOSCOPY WITH RETROGRADE PYELOGRAM, URETEROSCOPY AND STENT PLACEMENT: SHX5789

## 2020-06-21 HISTORY — DX: Gastro-esophageal reflux disease without esophagitis: K21.9

## 2020-06-21 HISTORY — DX: Hematuria, unspecified: R31.9

## 2020-06-21 HISTORY — DX: Headache, unspecified: R51.9

## 2020-06-21 HISTORY — DX: Frequency of micturition: R35.0

## 2020-06-21 HISTORY — DX: Presence of spectacles and contact lenses: Z97.3

## 2020-06-21 HISTORY — PX: HOLMIUM LASER APPLICATION: SHX5852

## 2020-06-21 LAB — POCT I-STAT, CHEM 8
BUN: 16 mg/dL (ref 8–23)
Calcium, Ion: 1.01 mmol/L — ABNORMAL LOW (ref 1.15–1.40)
Chloride: 108 mmol/L (ref 98–111)
Creatinine, Ser: 0.8 mg/dL (ref 0.44–1.00)
Glucose, Bld: 186 mg/dL — ABNORMAL HIGH (ref 70–99)
HCT: 48 % — ABNORMAL HIGH (ref 36.0–46.0)
Hemoglobin: 16.3 g/dL — ABNORMAL HIGH (ref 12.0–15.0)
Potassium: 4 mmol/L (ref 3.5–5.1)
Sodium: 141 mmol/L (ref 135–145)
TCO2: 23 mmol/L (ref 22–32)

## 2020-06-21 LAB — GLUCOSE, CAPILLARY: Glucose-Capillary: 159 mg/dL — ABNORMAL HIGH (ref 70–99)

## 2020-06-21 SURGERY — CYSTOURETEROSCOPY, WITH RETROGRADE PYELOGRAM AND STENT INSERTION
Anesthesia: General | Site: Urethra | Laterality: Left

## 2020-06-21 MED ORDER — LIDOCAINE 2% (20 MG/ML) 5 ML SYRINGE
INTRAMUSCULAR | Status: DC | PRN
  Administered 2020-06-21: 40 mg via INTRAVENOUS
  Administered 2020-06-21: 60 mg via INTRAVENOUS

## 2020-06-21 MED ORDER — CEFAZOLIN SODIUM-DEXTROSE 2-4 GM/100ML-% IV SOLN
2.0000 g | INTRAVENOUS | Status: AC
  Administered 2020-06-21: 2 g via INTRAVENOUS

## 2020-06-21 MED ORDER — PHENYLEPHRINE 40 MCG/ML (10ML) SYRINGE FOR IV PUSH (FOR BLOOD PRESSURE SUPPORT)
PREFILLED_SYRINGE | INTRAVENOUS | Status: AC
  Filled 2020-06-21: qty 10

## 2020-06-21 MED ORDER — ONDANSETRON HCL 4 MG/2ML IJ SOLN
INTRAMUSCULAR | Status: AC
  Filled 2020-06-21: qty 2

## 2020-06-21 MED ORDER — SCOPOLAMINE 1 MG/3DAYS TD PT72
MEDICATED_PATCH | TRANSDERMAL | Status: AC
  Filled 2020-06-21: qty 1

## 2020-06-21 MED ORDER — ACETAMINOPHEN 10 MG/ML IV SOLN
INTRAVENOUS | Status: DC | PRN
  Administered 2020-06-21: 1000 mg via INTRAVENOUS

## 2020-06-21 MED ORDER — LACTATED RINGERS IV SOLN: INTRAVENOUS | Status: DC

## 2020-06-21 MED ORDER — FENTANYL CITRATE (PF) 100 MCG/2ML IJ SOLN
INTRAMUSCULAR | Status: DC | PRN
  Administered 2020-06-21 (×2): 25 ug via INTRAVENOUS
  Administered 2020-06-21: 50 ug via INTRAVENOUS

## 2020-06-21 MED ORDER — FENTANYL CITRATE (PF) 100 MCG/2ML IJ SOLN: 25.0000 ug | INTRAMUSCULAR | Status: DC | PRN

## 2020-06-21 MED ORDER — EPHEDRINE 5 MG/ML INJ
INTRAVENOUS | Status: AC
  Filled 2020-06-21: qty 10

## 2020-06-21 MED ORDER — ONDANSETRON HCL 4 MG/2ML IJ SOLN: 4.0000 mg | Freq: Once | INTRAMUSCULAR | Status: DC | PRN

## 2020-06-21 MED ORDER — ONDANSETRON HCL 4 MG/2ML IJ SOLN
INTRAMUSCULAR | Status: DC | PRN
  Administered 2020-06-21: 4 mg via INTRAVENOUS

## 2020-06-21 MED ORDER — PROPOFOL 10 MG/ML IV BOLUS
INTRAVENOUS | Status: AC
  Filled 2020-06-21: qty 20

## 2020-06-21 MED ORDER — CEFAZOLIN SODIUM-DEXTROSE 2-4 GM/100ML-% IV SOLN
INTRAVENOUS | Status: AC
  Filled 2020-06-21: qty 100

## 2020-06-21 MED ORDER — IOHEXOL 300 MG/ML  SOLN
INTRAMUSCULAR | Status: DC | PRN
  Administered 2020-06-21: 7 mL via URETHRAL

## 2020-06-21 MED ORDER — DEXAMETHASONE SODIUM PHOSPHATE 10 MG/ML IJ SOLN
INTRAMUSCULAR | Status: AC
  Filled 2020-06-21: qty 1

## 2020-06-21 MED ORDER — KETOROLAC TROMETHAMINE 30 MG/ML IJ SOLN: 30.0000 mg | Freq: Once | INTRAMUSCULAR | Status: DC | PRN

## 2020-06-21 MED ORDER — FENTANYL CITRATE (PF) 100 MCG/2ML IJ SOLN
INTRAMUSCULAR | Status: AC
  Filled 2020-06-21: qty 2

## 2020-06-21 MED ORDER — OXYCODONE HCL 5 MG PO TABS: 5.0000 mg | ORAL_TABLET | Freq: Three times a day (TID) | ORAL | 0 refills | Status: DC | PRN

## 2020-06-21 MED ORDER — PHENYLEPHRINE 40 MCG/ML (10ML) SYRINGE FOR IV PUSH (FOR BLOOD PRESSURE SUPPORT)
PREFILLED_SYRINGE | INTRAVENOUS | Status: DC | PRN
  Administered 2020-06-21: 120 ug via INTRAVENOUS
  Administered 2020-06-21: 40 ug via INTRAVENOUS
  Administered 2020-06-21 (×2): 120 ug via INTRAVENOUS
  Administered 2020-06-21: 80 ug via INTRAVENOUS

## 2020-06-21 MED ORDER — MIDAZOLAM HCL 2 MG/2ML IJ SOLN
INTRAMUSCULAR | Status: AC
  Filled 2020-06-21: qty 2

## 2020-06-21 MED ORDER — MIDAZOLAM HCL 2 MG/2ML IJ SOLN
INTRAMUSCULAR | Status: DC | PRN
  Administered 2020-06-21 (×2): 1 mg via INTRAVENOUS

## 2020-06-21 MED ORDER — PROPOFOL 10 MG/ML IV BOLUS
INTRAVENOUS | Status: DC | PRN
  Administered 2020-06-21: 200 mg via INTRAVENOUS

## 2020-06-21 MED ORDER — LIDOCAINE 2% (20 MG/ML) 5 ML SYRINGE
INTRAMUSCULAR | Status: AC
  Filled 2020-06-21: qty 5

## 2020-06-21 MED ORDER — SODIUM CHLORIDE 0.9 % IR SOLN
Status: DC | PRN
  Administered 2020-06-21: 6000 mL

## 2020-06-21 MED ORDER — CEPHALEXIN 500 MG PO CAPS: 500.0000 mg | ORAL_CAPSULE | Freq: Once | ORAL | 0 refills | Status: AC

## 2020-06-21 MED ORDER — EPHEDRINE SULFATE-NACL 50-0.9 MG/10ML-% IV SOSY
PREFILLED_SYRINGE | INTRAVENOUS | Status: DC | PRN
  Administered 2020-06-21 (×2): 10 mg via INTRAVENOUS

## 2020-06-21 MED ORDER — DEXAMETHASONE SODIUM PHOSPHATE 10 MG/ML IJ SOLN
INTRAMUSCULAR | Status: DC | PRN
  Administered 2020-06-21: 5 mg via INTRAVENOUS

## 2020-06-21 SURGICAL SUPPLY — 20 items
BAG DRAIN URO-CYSTO SKYTR STRL (DRAIN) ×2 IMPLANT
BAG DRN UROCATH (DRAIN) ×1
BASKET ZERO TIP NITINOL 2.4FR (BASKET) ×1 IMPLANT
BSKT STON RTRVL ZERO TP 2.4FR (BASKET) ×1
CATH URET 5FR 28IN OPEN ENDED (CATHETERS) ×2 IMPLANT
CLOTH BEACON ORANGE TIMEOUT ST (SAFETY) ×2 IMPLANT
GLOVE SURG ENC MOIS LTX SZ6.5 (GLOVE) ×2 IMPLANT
GLOVE SURG UNDER POLY LF SZ7.5 (GLOVE) ×2 IMPLANT
GOWN STRL REUS W/TWL LRG LVL3 (GOWN DISPOSABLE) ×3 IMPLANT
GUIDEWIRE STR DUAL SENSOR (WIRE) ×3 IMPLANT
INFUSOR MANOMETER BAG 3000ML (MISCELLANEOUS) ×1 IMPLANT
IV NS IRRIG 3000ML ARTHROMATIC (IV SOLUTION) ×3 IMPLANT
KIT TURNOVER CYSTO (KITS) ×2 IMPLANT
MANIFOLD NEPTUNE II (INSTRUMENTS) ×2 IMPLANT
PACK CYSTO (CUSTOM PROCEDURE TRAY) ×2 IMPLANT
SHEATH URETERAL 12FRX28CM (UROLOGICAL SUPPLIES) ×1 IMPLANT
STENT URET 6FRX26 CONTOUR (STENTS) ×1 IMPLANT
TRACTIP FLEXIVA PULS ID 200XHI (Laser) IMPLANT
TRACTIP FLEXIVA PULSE ID 200 (Laser) ×2
TUBE CONNECTING 12X1/4 (SUCTIONS) ×2 IMPLANT

## 2020-06-21 NOTE — H&P (Signed)
CC/HPI: 05/20/2019: Patient with past history of nephrolithiasis undergoing left PCNL last fall. Patient historically forms calcium oxalate stones and after recent review of 24 hour urinalysis [name] [name] has been placed on potassium citrate therapy as [name] [name] was found to have low urine pH, low urine volume, increased super saturation of calcium oxalate. [name] [name] has been counseled on increasing her hydration as well as other dietary modification to further inhibit stone formation. BMP assessed earlier this week noted normal potassium and calcium, stable creatinine at 0.7.   Approximately 2 days ago the patient awoke with right lower back pain that radiates into the top of the right hip. Pain is worse with movement and position changes. No radiation into the flank or lower abdomen. Voiding at her baseline without painful or burning urination, visible blood in the urine. [name] [name] does state that her urine has a foul odor to it which is an unusual finding for her. Her frequency, urgency, and nocturia are stable. [name] [name] denies fevers or chills, nausea/vomiting.   08/31/19: The patient returns today for follow-up of left renal calculi and been doing well. Her 24 hour urine showed a very low urine pH and very low urine volume. [name] [name] has really done a good job at increasing her fluid intake. [name] [name] was also appropriately placed on potassium citrate and remains compliant with that at a dosage 15 mEq b.i.d.. [name] [name] has not had any flank pain, hematuria or other symptoms to suggest the passage of a stone.   06/07/20: [name] [name] is a 60 year old female who is currently living in a hotel. [name] [name] presents to the ED early this morning with severe left sided flank pain. [name] [name] was diagnosed with a left sided proximal 63mm ureteral stone as well as a nonobstructing 1 cm left lower pole stone. [name] [name] was discharged with follow up here. [name] [name] is very tearful due to this very stressful time of experiencing recent loss of her home and financial stability. [name] [name] would like to  proceed with definitive stone intervention in the most definitve way. [name] [name] states [name] [name] can not handle any other stressors now. [name] [name] denies fevers, chills and dysuria. [name] [name] is currently not having any discomfort.     ALLERGIES: None   MEDICATIONS: Aspirin  Metformin Hcl  Metoprolol Tartrate  Zyrtec  Acetaminophen 325 mg capsule  Atorvastatin Calcium 80 mg tablet  Biotin  Isosorbide Dinitrate 30 mg tablet  Nitroglycerin 0.3 mg tablet, sublingual  REPATHA PUSHTRONEX Monthly  Victoza  Vitamin D2     GU PSH: Cysto Remove Stent FB Sim - 01/17/2019 Hysterectomy Percut Stone Removal >2cm, Left - 01/10/2019 Placement of ureteral stent, percutaneous, Left - 01/10/2019     NON-GU PSH: Bilateral Tubal Ligation Breast Biopsy CABG (coronary artery bypass grafting) Tonsillectomy     GU PMH: Low back pain - 05/20/2019 Renal calculus (Stable) - 05/20/2019, - 01/17/2019, Left, [name] [name] has several large renal calculi and therefore we have discussed proceeding with PCNL. [name] [name] is in agreement., - 11/18/2018 (Acute), Left, - 10/26/2018 Ureteral obstruction secondary to calculous (Acute), Left - 10/26/2018    NON-GU PMH: Pyuria/other UA findings (Stable), I noted pyuria and bacteriuria with minimal epithelial cells and nitrite positivity to her urine although [name] [name] is not having any symptoms of a UTI. I have culture her urine. - 08/31/2019, - 03/01/2019 Arthritis Depression Diabetes Type 2 Encounter for general adult medical examination without abnormal findings, Encounter for preventive health examination GERD Heart disease, unspecified Hypercholesterolemia Hypertension Stroke/TIA    FAMILY HISTORY: None   SOCIAL HISTORY:  Marital Status: Widowed Preferred Language: English; Ethnicity: Not Hispanic Or Latino; Race: Black or African American Current Smoking Status: Patient does not smoke anymore. Smoked 1/2 pack per day.   Tobacco Use Assessment Completed: Used Tobacco in last 30 days? Has never drank.   Does not drink caffeine.    REVIEW OF SYSTEMS:    GU Review Female:   Patient denies frequent urination, hard to postpone urination, burning /pain with urination, get up at night to urinate, leakage of urine, stream starts and stops, trouble starting your stream, have to strain to urinate, and being pregnant.  Gastrointestinal (Upper):   Patient reports nausea and vomiting. Patient denies indigestion/ heartburn.  Gastrointestinal (Lower):   Patient denies diarrhea and constipation.  Constitutional:   Patient reports fatigue. Patient denies fever, weight loss, and night sweats.  Musculoskeletal:   Patient denies back pain and joint pain.  Neurological:   Patient denies headaches and dizziness.  Psychologic:   Patient denies depression and anxiety.   Notes: LEft flank pain, blood in urine    VITAL SIGNS:      06/07/2020 02:19 PM  Weight 220 lb / 99.79 kg  Height 66 in / 167.64 cm  BP 137/83 mmHg  Pulse 77 /min  Temperature 97.7 F / 36.5 C  BMI 35.5 kg/m   GU PHYSICAL EXAMINATION:      Notes: LEft flank tenderness   MULTI-SYSTEM PHYSICAL EXAMINATION:    Constitutional: Well-nourished. No physical deformities. Normally developed. Good grooming.  Respiratory: No labored breathing, no use of accessory muscles.   Cardiovascular: Normal temperature, normal extremity pulses, no swelling, no varicosities.  Skin: No paleness, no jaundice, no cyanosis. No lesion, no ulcer, no rash.  Neurologic / Psychiatric: Patient depressed. Oriented to time, oriented to place, oriented to person. No anxiety, no agitation.   Gastrointestinal: No mass, no tenderness, no rigidity, non obese abdomen.     Complexity of Data:  Source Of History:  Patient, Medical Record Summary  Records Review:   Previous Doctor Records, Previous Hospital Records, Previous Patient Records  X-Ray Review: C.T. Stone Protocol: Reviewed Films. Reviewed Report. Discussed With Patient.     PROCEDURES: None   ASSESSMENT:       ICD-10 Details  1 GU:   Renal calculus - N20.0 Left, Chronic, Worsening  2   Ureteral obstruction secondary to calculous - N13.2 Left, Acute, Systemic Symptoms   PLAN:            Medications New Meds: Tamsulosin Hcl 0.4 mg capsule 1 capsule PO Q HS   #30  0 Refill(s)  Oxycodone Hcl 5 mg tablet 1 tablet PO Q 6 H PRN as needed for severe pain  #20  0 Refill(s)  Promethazine Hcl 12.5 mg tablet 1 tablet PO Q 6 H PRN for nausea  #20  0 Refill(s)            Document Letter(s):  Created for Patient: Clinical Summary         Notes:   [name] [name] left a urine with thehospital today, but was unable to provide a sample for our clinic. We discussed definitive stone management and [name] [name] would like to proceed with URS. The risks of this procedure were discussed. Pain and nausea medication sent to her pharmacy. I also sent in tamsulosin, with hopes [name] [name] may this stone on her own. [name] [name] was provided a strainer. [name] [name] will strain her urine. Green sheet placed for URS with Dr. Claudia Desanctis. STrict return precautions for worsening symptoms including fevers,  chills, inability to void, worsening pain, inability to hold down POs.

## 2020-06-21 NOTE — Discharge Instructions (Signed)
DISCHARGE INSTRUCTIONS FOR KIDNEY STONE/URETERAL STENT   MEDICATIONS:  1. Resume all your other meds from home  2. Oxycodone is for moderate/severe pain, otherwise taking upto 1000 mg every 6 hours of plainTylenol will help treat your pain.   3. Take Cephalexin one hour prior to removal of your stent.    ACTIVITY:  1. No strenuous activity x 1week  2. No driving while on narcotic pain medications  3. Drink plenty of water  4. Continue to walk at home - you can still get blood clots when you are at home, so keep active, but don't over do it.  5. May return to work/school tomorrow or when you feel ready   BATHING:  1. You can shower and we recommend daily showers  2. You have a string coming from your urethra: The stent string is attached to your ureteral stent. Do not pull on this.   SIGNS/SYMPTOMS TO CALL:  Please call us if you have a fever greater than 101.5, uncontrolled nausea/vomiting, uncontrolled pain, dizziness, unable to urinate, bloody urine, chest pain, shortness of breath, leg swelling, leg pain, redness around wound, drainage from wound, or any other concerns or questions.   You can reach [place] at [phone].   FOLLOW-UP:  1. You have a string attached to your stent, you may remove it on Monday, April 11. To do this, pull the strings until the stent is completely removed. You may feel an odd sensation in your back.    Post Anesthesia Home Care Instructions  Activity: Get plenty of rest for the remainder of the day. A responsible individual must stay with you for 24 hours following the procedure.  For the next 24 hours, DO NOT: -Drive a car -Paediatric nurse -Drink alcoholic beverages -Take any medication unless instructed by your physician -Make any legal decisions or sign important papers.  Meals: Start with liquid foods such as gelatin or soup. Progress to regular foods as tolerated. Avoid greasy, spicy, heavy foods. If nausea and/or vomiting occur, drink only  clear liquids until the nausea and/or vomiting subsides. Call your physician if vomiting continues.  Special Instructions/Symptoms: Your throat may feel dry or sore from the anesthesia or the breathing tube placed in your throat during surgery. If this causes discomfort, gargle with warm salt water. The discomfort should disappear within 24 hours.  If you had a scopolamine patch placed behind your ear for the management of post- operative nausea and/or vomiting:  1. The medication in the patch is effective for 72 hours, after which it should be removed.  Wrap patch in a tissue and discard in the trash. Wash hands thoroughly with soap and water. 2. You may remove the patch earlier than 72 hours if you experience unpleasant side effects which may include dry mouth, dizziness or visual disturbances. 3. Avoid touching the patch. Wash your hands with soap and water after contact with the patch.

## 2020-06-21 NOTE — Transfer of Care (Signed)
Immediate Anesthesia Transfer of Care Note  Patient: [name] [name]  Procedure(s) Performed: Procedure(s) (LRB): CYSTOSCOPY WITH RETROGRADE PYELOGRAM, URETEROSCOPY, BASKET STONE EXTRACTION AND  STENT PLACEMENT (Left) HOLMIUM LASER APPLICATION (Left)  Patient Location: PACU  Anesthesia Type: General  Level of Consciousness: awake, alert  and oriented  Airway & Oxygen Therapy: Patient Spontanous Breathing and Patient connected to face mask oxygen  Post-op Assessment: Report given to PACU RN and Post -op Vital signs reviewed and stable  Post vital signs: Reviewed and stable  Complications: No apparent anesthesia complicationsLast Vitals:  Vitals Value Taken Time  BP 131/87 06/21/20 1245  Temp 36.6 C 06/21/20 1245  Pulse 70 06/21/20 1251  Resp 11 06/21/20 1251  SpO2 95 % 06/21/20 1251  Vitals shown include unvalidated device data.  Last Pain:  Vitals:   06/21/20 0924  TempSrc: Oral  PainSc: 10-Worst pain ever      Patients Stated Pain Goal: 6 ("I have a very high tolerance for pain") (51/70/01 7494)  Complications: No complications documented.

## 2020-06-21 NOTE — Anesthesia Preprocedure Evaluation (Signed)
Anesthesia Evaluation  Patient identified by MRN, date of birth, ID band Patient awake    Reviewed: Allergy & Precautions, NPO status , Patient's Chart, lab work & pertinent test results  History of Anesthesia Complications (+) PONV  Airway Mallampati: II  TM Distance: >3 FB Neck ROM: Full    Dental no notable dental hx.    Pulmonary neg pulmonary ROS, former smoker,    Pulmonary exam normal breath sounds clear to auscultation       Cardiovascular hypertension, + CAD, + Past MI, + Cardiac Stents and + CABG  Normal cardiovascular exam Rhythm:Regular Rate:Normal     Neuro/Psych CVA, No Residual Symptoms negative psych ROS   GI/Hepatic negative GI ROS, Neg liver ROS,   Endo/Other  diabetes  Renal/GU negative Renal ROS  negative genitourinary   Musculoskeletal  (+) Arthritis , Rheumatoid disorders,    Abdominal   Peds negative pediatric ROS (+)  Hematology negative hematology ROS (+)   Anesthesia Other Findings   Reproductive/Obstetrics negative OB ROS                             Anesthesia Physical Anesthesia Plan  ASA: III  Anesthesia Plan: General   Post-op Pain Management:    Induction: Intravenous  PONV Risk Score and Plan: 4 or greater and Ondansetron, Dexamethasone, Midazolam, Scopolamine patch - Pre-op and Treatment may vary due to age or medical condition  Airway Management Planned: LMA  Additional Equipment:   Intra-op Plan:   Post-operative Plan: Extubation in OR  Informed Consent: I have reviewed the patients History and Physical, chart, labs and discussed the procedure including the risks, benefits and alternatives for the proposed anesthesia with the patient or authorized representative who has indicated his/her understanding and acceptance.     Dental advisory given  Plan Discussed with: CRNA and Surgeon  Anesthesia Plan Comments:         Anesthesia  Quick Evaluation

## 2020-06-21 NOTE — Interval H&P Note (Signed)
History and Physical Interval Note: Patient passed blood on Monday but no distinct stone.  She is still experiencing some discomfort on left side.   06/21/2020 11:29 AM  [name]  has presented today for surgery, with the diagnosis of LEFT URETERAL STONE.  The various methods of treatment have been discussed with the patient and family. After consideration of risks, benefits and other options for treatment, the patient has consented to  Procedure(s) with comments: CYSTOSCOPY WITH POSSIBLE RETROGRADE PYELOGRAM, URETEROSCOPY AND POSSIBLE STENT PLACEMENT (Left) - 15 MINS HOLMIUM LASER APPLICATION (Left) as a surgical intervention.  The patient's history has been reviewed, patient examined, no change in status, stable for surgery.  I have reviewed the patient's chart and labs.  Questions were answered to the patient's satisfaction.     Verlene Glantz D Trygg Mantz

## 2020-06-21 NOTE — Anesthesia Procedure Notes (Signed)
Procedure Name: LMA Insertion Date/Time: 06/21/2020 11:52 AM Performed by: Mechele Claude, CRNA Pre-anesthesia Checklist: Patient identified, Emergency Drugs available, Suction available and Patient being monitored Patient Re-evaluated:Patient Re-evaluated prior to induction Oxygen Delivery Method: Circle system utilized Preoxygenation: Pre-oxygenation with 100% oxygen Induction Type: IV induction Ventilation: Mask ventilation without difficulty LMA: LMA inserted LMA Size: 4.0 Number of attempts: 1 Airway Equipment and Method: Bite block Placement Confirmation: positive ETCO2 Tube secured with: Tape Dental Injury: Teeth and Oropharynx as per pre-operative assessment

## 2020-06-21 NOTE — Op Note (Signed)
Preoperative diagnosis: left ureteral calculus  Postoperative diagnosis: left renal calculi  Procedure:  1. Cystoscopy 2. left ureteroscopy, laser lithotripsy and basket stone extraction  3. left 33F x 26 ureteral stent placement  4. left retrograde pyelography with interpretation  Surgeon: Jacalyn Lefevre, MD  Anesthesia: General  Complications: None  Intraoperative findings: 1. left retrograde pyelography normal without filling defects 2.  No stone encountered in distal ureter 3.  Normal urethra 4.  Normal bladder mucosa without masses or stones 5.  6 French by 26 cm JJ stent with tether  EBL: Minimal  Specimens: 1. left ureteral calculus  Disposition of specimens: [facility] for stone analysis  Indication: [name] [name] is a 60 y.o.   patient with a 91mm left ureteral stone and associated left symptoms. After reviewing the management options for treatment, the patient elected to proceed with the above surgical procedure(s). We have discussed the potential benefits and risks of the procedure, side effects of the proposed treatment, the likelihood of the patient achieving the goals of the procedure, and any potential problems that might occur during the procedure or recuperation. Informed consent has been obtained.   Description of procedure:  The patient was taken to the operating room and general anesthesia was induced.  The patient was placed in the dorsal lithotomy position, prepped and draped in the usual sterile fashion, and preoperative antibiotics were administered. A preoperative time-out was performed.   Cystourethroscopy was performed.  The patient's urethra was examined and was normal.  The bladder was then systematically examined in its entirety. There was no evidence for any bladder tumors, stones, or other mucosal pathology.    Attention then turned to the left ureteral orifice and a ureteral catheter was used to intubate the ureteral orifice.   Omnipaque contrast was injected through the ureteral catheter and a retrograde pyelogram was performed with findings as dictated above.  A 0.38 sensor guidewire was then advanced up the left ureter into the renal pelvis under fluoroscopic guidance. The 4.5 Fr semirigid ureteroscope was then advanced into the ureter next to the guidewire to the level of the UPJ.  No stone was encountered.  Next another 0.38 sensor wire was advanced through the ureteroscope into the renal pelvis.  The ureteroscope was removed.  A ureteral access sheath was advanced over the second wire with fluoroscopic guidance.  The inner sheath and wire removed.  Next flexible ureteroscopy took place and several small stones were encountered in the mid and lower poles.   The stone was then fragmented with the 200 micron holmium laser fiber. All stones were then removed from the ureter with a 0 tip basket.  Reinspection of the ureter revealed no remaining visible stones or fragments.   The wire was then backloaded through the cystoscope and a ureteral stent was advance over the wire using Seldinger technique.  The stent was positioned appropriately under fluoroscopic and cystoscopic guidance.  The wire was then removed with an adequate stent curl noted in the renal pelvis as well as in the bladder.  The bladder was then emptied and the procedure ended.  The patient appeared to tolerate the procedure well and without complications.  The patient was able to be awakened and transferred to the recovery unit in satisfactory condition.   Disposition: The tether of the stent was left on and tucked inside the patient's vagina.  Instructions for removing the stent have been provided to the patient.

## 2020-06-21 NOTE — Anesthesia Postprocedure Evaluation (Signed)
Anesthesia Post Note  Patient: ELYSA [name]  Procedure(s) Performed: CYSTOSCOPY WITH RETROGRADE PYELOGRAM, URETEROSCOPY, BASKET STONE EXTRACTION AND  STENT PLACEMENT (Left Urethra) HOLMIUM LASER APPLICATION (Left Urethra)     Patient location during evaluation: PACU Anesthesia Type: General Level of consciousness: awake and alert Pain management: pain level controlled Vital Signs Assessment: post-procedure vital signs reviewed and stable Respiratory status: spontaneous breathing, nonlabored ventilation, respiratory function stable and patient connected to nasal cannula oxygen Cardiovascular status: blood pressure returned to baseline and stable Postop Assessment: no apparent nausea or vomiting Anesthetic complications: no   No complications documented.  Last Vitals:  Vitals:   06/21/20 1315 06/21/20 1319  BP:  121/76  Pulse: 63 65  Resp: 15 10  Temp:    SpO2: 92% 93%    Last Pain:  Vitals:   06/21/20 0924  TempSrc: Oral  PainSc: 10-Worst pain ever                 Janyia Guion S

## 2020-06-22 ENCOUNTER — Encounter (HOSPITAL_BASED_OUTPATIENT_CLINIC_OR_DEPARTMENT_OTHER): Payer: Self-pay | Admitting: Urology

## 2020-06-25 ENCOUNTER — Telehealth: Payer: Self-pay | Admitting: Cardiology

## 2020-06-25 NOTE — Telephone Encounter (Signed)
   Pt said she is feeling sharp stabbing pain every time she breaths in, she can feel the pain on her chest, stomach and back ares, she denied chest pain but she said she saw her pcp and didn't really give her any recommendations, she said she's been feeling this for 3-4 weeks now. She also said she is having headache on the left side of her head radiating to her eye

## 2020-06-25 NOTE — Telephone Encounter (Signed)
Spoke to patient she stated she has been feeling bad for 1 month.Stated she had kidney stone surgery.Stated she has been having left shoulder and mid back pain.She is also having pain in joints and stiffness in neck.She has appointment with her rheumatologist this Thursday.Apppointment scheduled with Coletta Memos NP 5/6 at 9:15 am.Advised if lf shoulder and back pain worsen go to ED.

## 2020-06-28 DIAGNOSIS — K219 Gastro-esophageal reflux disease without esophagitis: Secondary | ICD-10-CM | POA: Diagnosis not present

## 2020-06-28 DIAGNOSIS — R768 Other specified abnormal immunological findings in serum: Secondary | ICD-10-CM | POA: Diagnosis not present

## 2020-06-28 DIAGNOSIS — Z111 Encounter for screening for respiratory tuberculosis: Secondary | ICD-10-CM | POA: Diagnosis not present

## 2020-06-28 DIAGNOSIS — G4700 Insomnia, unspecified: Secondary | ICD-10-CM | POA: Diagnosis not present

## 2020-06-28 DIAGNOSIS — F329 Major depressive disorder, single episode, unspecified: Secondary | ICD-10-CM | POA: Diagnosis not present

## 2020-06-28 DIAGNOSIS — I10 Essential (primary) hypertension: Secondary | ICD-10-CM | POA: Diagnosis not present

## 2020-06-28 DIAGNOSIS — E785 Hyperlipidemia, unspecified: Secondary | ICD-10-CM | POA: Diagnosis not present

## 2020-06-28 DIAGNOSIS — Z6834 Body mass index (BMI) 34.0-34.9, adult: Secondary | ICD-10-CM | POA: Diagnosis not present

## 2020-06-28 DIAGNOSIS — M2550 Pain in unspecified joint: Secondary | ICD-10-CM | POA: Diagnosis not present

## 2020-06-28 DIAGNOSIS — L4050 Arthropathic psoriasis, unspecified: Secondary | ICD-10-CM | POA: Diagnosis not present

## 2020-06-28 DIAGNOSIS — Z9119 Patient's noncompliance with other medical treatment and regimen: Secondary | ICD-10-CM | POA: Diagnosis not present

## 2020-06-28 DIAGNOSIS — L409 Psoriasis, unspecified: Secondary | ICD-10-CM | POA: Diagnosis not present

## 2020-06-28 DIAGNOSIS — R5383 Other fatigue: Secondary | ICD-10-CM | POA: Diagnosis not present

## 2020-06-28 DIAGNOSIS — E1121 Type 2 diabetes mellitus with diabetic nephropathy: Secondary | ICD-10-CM | POA: Diagnosis not present

## 2020-06-28 DIAGNOSIS — E669 Obesity, unspecified: Secondary | ICD-10-CM | POA: Diagnosis not present

## 2020-06-28 DIAGNOSIS — N132 Hydronephrosis with renal and ureteral calculous obstruction: Secondary | ICD-10-CM | POA: Diagnosis not present

## 2020-06-28 DIAGNOSIS — E1165 Type 2 diabetes mellitus with hyperglycemia: Secondary | ICD-10-CM | POA: Diagnosis not present

## 2020-06-28 DIAGNOSIS — M059 Rheumatoid arthritis with rheumatoid factor, unspecified: Secondary | ICD-10-CM | POA: Diagnosis not present

## 2020-07-05 DIAGNOSIS — N132 Hydronephrosis with renal and ureteral calculous obstruction: Secondary | ICD-10-CM | POA: Diagnosis not present

## 2020-07-10 DIAGNOSIS — E119 Type 2 diabetes mellitus without complications: Secondary | ICD-10-CM | POA: Diagnosis not present

## 2020-07-18 NOTE — Progress Notes (Signed)
Cardiology Clinic Note   Patient Name: [name] [name] Date of Encounter: 07/20/2020  Primary Care Provider:  Leeroy Cha, MD Primary Cardiologist:  [name] Martinique, MD  Patient Profile    [name] [name] 60 year old female presents the clinic today for evaluation of her sharp chest, stomach, and back pain.  Underwent ureteroscopy for kidney stone removal 06/21/20.  Past Medical History    Past Medical History:  Diagnosis Date  . Acute ST elevation myocardial infarction (STEMI) involving right coronary artery ([place]) 10/04/2015   x 2  . Anxiety   . Anxiety and depression   . CAD (coronary artery disease)    a. CABG 06/2015: LIMA-LAD, SVG-RCA, SVG-OM2 b. STEMI s/p PCI to distal RCA lesion with a small Promus Premier stent. ( patent LIMA to the LAD, occluded SVG to OM, occluded SVG to RCA.)  . CAD (coronary artery disease) of artery bypass graft; occluded SVG to RCA and occluded SVG to OM 10/08/2015  . Empty sella ([place])   . GERD (gastroesophageal reflux disease)   . Headache    from stress  . Hematuria wed 06-13-2020  . Hidradenitis axillaris   . History of kidney stones   . Hyperlipemia   . Hypertension   . Lupus ([place]) dx'd 2008   "they think I have this; have to get retested" (06/12/2015)  . Obesity (BMI 30.0-34.9)   . Pneumonia X 2   last time yrs ago  . PONV (postoperative nausea and vomiting)    likes iv med or scopolamine patch works well  . Psoriasis 06/15/2020   hands elbows, ears, small amount on scalp and bridge of nose  . Rheumatoid aortitis    psorriatic arthritis  . Rheumatoid arthritis ([place])   . S/P angioplasty with stent; 10/04/15 to distal RCA lesion. with Promus premier. 10/08/2015  . Scoliosis   . Sleep apnea    patient states "it is mild" no cpap needed  . Stroke [facility]) March 2014   left MCA branches; "they say I have them all the time", denies residual on 06/12/2015  . Tobacco abuse   . Type II diabetes mellitus ([place])   . Urinary frequency    and  urgency  . Wears glasses    for reading   Past Surgical History:  Procedure Laterality Date  . ABDOMINAL HYSTERECTOMY  31 yrs ago   total has ovaies  . BREAST BIOPSY Bilateral yrd ago   benign  . CARDIAC CATHETERIZATION N/A 06/13/2015   Procedure: Left Heart Cath and Coronary Angiography;  Surgeon: [name] M Martinique, MD;  Location: [place] [facility];  Service: Cardiovascular;  Laterality: N/A;  . CARDIAC CATHETERIZATION N/A 10/04/2015   Procedure: Left Heart Cath and Coronary Angiography;  Surgeon: [name] Booze, MD;  Location: [place] [facility];  Service: Cardiovascular;  Laterality: N/A;  . CARDIAC CATHETERIZATION N/A 10/04/2015   Procedure: Coronary Stent Intervention;  Surgeon: [name] Booze, MD;  Location: [place] [facility];  Service: Cardiovascular;  Laterality: N/A;  . CORONARY ARTERY BYPASS GRAFT N/A 06/19/2015   Procedure: CORONARY ARTERY BYPASS GRAFTING (CABG) TIMES FOUR USING LEFT INTERNAL MAMMARY, RIGHT SAPHENOUS LEG VEIN AND CRYO SAPHENOUS VEIN;  Surgeon: [name] Poot, MD;  Location: [place];  Service: Open Heart Surgery;  Laterality: N/A;  . CYSTOSCOPY WITH RETROGRADE PYELOGRAM, URETEROSCOPY AND STENT PLACEMENT Left 06/21/2020   Procedure: CYSTOSCOPY WITH RETROGRADE PYELOGRAM, URETEROSCOPY, BASKET STONE EXTRACTION AND  STENT PLACEMENT;  Surgeon: [name] Fries, MD;  Location: [facility];  Service: Urology;  Laterality: Left;  . DILATION AND CURETTAGE OF UTERUS  yrs ago  . HOLMIUM LASER APPLICATION Left 04/21/537   Procedure: HOLMIUM LASER APPLICATION;  Surgeon: [name] Fries, MD;  Location[facility];  Service: Urology;  Laterality: Left;  . HYDRADENITIS EXCISION Right 12/20/2019   Procedure: EXCISION OF RIGHT  HIDRADENITIS AXILLARY;  Surgeon: [name] Mesa, MD;  Location: [place];  Service: General;  Laterality: Right;  . IR URETERAL STENT LEFT NEW ACCESS W/O SEP NEPHROSTOMY CATH  01/10/2019  . NEPHROLITHOTOMY Left 01/10/2019    Procedure: NEPHROLITHOTOMY PERCUTANEOUS;  Surgeon: [name] Rhodes, MD;  Location: [facility];  Service: Urology;  Laterality: Left;  . TEE WITHOUT CARDIOVERSION N/A 06/19/2015   Procedure: TRANSESOPHAGEAL ECHOCARDIOGRAM (TEE);  Surgeon: [name] Poot, MD;  Location: [place];  Service: Open Heart Surgery;  Laterality: N/A;  . TONSILLECTOMY  age 10  . TUBAL LIGATION  many yrs ago    Allergies  No Known Allergies  History of Present Illness    [name] has a PMH ofcoronary artery disease status post CABG in 2017, PCI with DES to distal RCA in 2017, hypertension, hyperlipidemia, type 2 diabetes, lupus, and psoriatic arthritis.  She was admitted to the hospital 04/06/2020 - 04/07/2020 with chest discomfort.  She was started on amlodipine and her Imdur was increased.  No new changes were identified on her EKG.  Her troponins were low and flat she was discharged in stable condition.  She was seen in the clinic 04/26/2020.  During that time she reported that her symptoms had improved.  She had no cardiac complaints.  Her blood pressure was well controlled.  She denied shortness of breath, palpitations, dizziness, lightheadedness, and syncope.  She underwent ureteroscopy for renal stone removal 06/21/20.  She contacted nurse triage line on 06/25/2020 and reported feeling pain in her chest, stomach, and back.  She reported she had presented to her PCP did not give recommendations.  She reported left-sided headache with radiation to her left eye.  She presents the clinic today for evaluation and states she continues to have body aches and has been working with her rheumatologist.  She describes pain under her chest that increases with deep breaths and increased pressure.  She has been prescribed prednisone for her flare of psoriatic arthritis, probably arthralgia and positive ANA.  We discussed that her pain is chest wall pain and not related to cardiac conditions.  She reports that her blood pressure has been slightly  elevated with her increased body aches.  She is also not been as physically active due to her body aches.  I will give her the salty 6 diet sheet, have her increase her physical activity as tolerated, and follow-up with Dr. Martinique in 3 to 5 months.  Today she denies chest pain, shortness of breath, lower extremity edema, fatigue, palpitations, melena, hematuria, hemoptysis, diaphoresis, weakness, presyncope, syncope, orthopnea, and PND.   Home Medications    Prior to Admission medications   Medication Sig Start Date End Date Taking? Authorizing Provider  amLODipine (NORVASC) 5 MG tablet Take 1 tablet (5 mg total) by mouth daily. 04/26/20   Kroeger, Lorelee Cover., [name]  aspirin EC 81 MG EC tablet Take 1 tablet (81 mg total) by mouth daily. 06/25/15   Nani Skillern, [name]  atorvastatin (LIPITOR) 80 MG tablet TAKE 1 TABLET EVERY DAY  AT  6PM Patient taking differently: Take 80 mg by mouth daily at 6 PM. 05/29/17  [name], [name] M, MD  Biotin w/ Vitamins C & E (HAIR/SKIN/NAILS PO) Take 1-2 tablets by mouth See admin instructions. Take 1 tablet in the morning & take 2 tablets in the evening.    [provider]  cetirizine (ZYRTEC) 10 MG tablet Take 10 mg by mouth daily. 02/18/17   [provider]  cholecalciferol (VITAMIN D) 25 MCG (1000 UNIT) tablet Take 1,000 Units by mouth in the morning and at bedtime.    [provider]  Evolocumab with Infusor ([place]) 420 MG/3.5ML SOCT Inject 420 mg into the skin every 30 (thirty) days. 04/26/20   Kroeger, Lorelee Cover., [name]  famotidine (PEPCID) 20 MG tablet Take 20 mg by mouth at bedtime as needed for heartburn or indigestion.    [provider]  guaiFENesin (MUCINEX) 600 MG 12 hr tablet Take 600 mg by mouth 2 (two) times daily as needed (congestion.).    [provider]  isosorbide mononitrate (IMDUR) 30 MG 24 hr tablet Take 1 tablet (30 mg total) by mouth daily. 04/26/20   Kroeger, Lorelee Cover., [name]   liraglutide (VICTOZA) 18 MG/3ML SOPN Inject 1.8 mg into the skin at bedtime.     [provider]  metFORMIN (GLUCOPHAGE) 1000 MG tablet Take 1,000 mg by mouth 2 (two) times daily. 04/06/15   [provider]  metoprolol tartrate (LOPRESSOR) 25 MG tablet Take one and a half (1.5) tablets (37.5 mg) by mouth twice (2) daily. Patient taking differently: Take 37.5 mg by mouth 2 (two) times daily. 09/15/17   [name], [name] M, MD  Multiple Vitamin (MULTIVITAMIN WITH MINERALS) TABS tablet Take 1 tablet by mouth daily. Centrum Silver    [provider]  nitroGLYCERIN (NITROSTAT) 0.4 MG SL tablet DISSOLVE 1 TABLET UNDER THE TONGUE EVERY 5 MINUTES AS  NEEDED FOR CHEST PAIN. MAX  OF 3 TABLETS IN 15 MINUTES. CALL 911 IF PAIN PERSISTS. Patient taking differently: Place 0.4 mg under the tongue every 5 (five) minutes as needed for chest pain. 11/15/18   [name], [name] M, MD  ondansetron (ZOFRAN ODT) 4 MG disintegrating tablet Take 1 tablet (4 mg total) by mouth every 8 (eight) hours as needed for nausea. 06/07/20   Larene Pickett, [name]  oxyCODONE (ROXICODONE) 5 MG immediate release tablet Take 1 tablet (5 mg total) by mouth every 8 (eight) hours as needed. 06/21/20 06/21/21  [name] Fries, MD  oxyCODONE-acetaminophen (PERCOCET) 5-325 MG tablet Take 1 tablet by mouth every 4 (four) hours as needed. 06/07/20   Larene Pickett, [name]  Potassium Citrate 15 MEQ (1620 MG) TBCR Take 15 mg by mouth in the morning and at bedtime. 10/05/19   [provider]  tamsulosin (FLOMAX) 0.4 MG CAPS capsule Take 1 capsule (0.4 mg total) by mouth daily after supper. 06/07/20   Larene Pickett, [name]  triamcinolone (KENALOG) 0.025 % cream Apply 1 application topically 2 (two) times daily. 05/29/20   [provider]    Family History    Family History  Problem Relation Age of Onset  . Diabetes Mother   . Heart failure Mother   . Breast cancer Mother   . Stroke Maternal Aunt   . Diabetes Other         maternal  . Breast cancer Other   . Alzheimer's disease Other        maternal  . Depression Son    She indicated that her mother is deceased. She indicated that her father is deceased. She indicated  that her maternal grandmother is deceased. She indicated that her maternal grandfather is deceased. She indicated that her paternal grandmother is deceased. She indicated that her paternal grandfather is deceased. She indicated that the status of her son is unknown. She indicated that the status of her maternal aunt is unknown.  Social History    Social History   Socioeconomic History  . Marital status: Widowed    Spouse name: Not on file  . Number of children: 2  . Years of education: college  . Highest education level: Some college, no degree  Occupational History  . Occupation: Disability  Tobacco Use  . Smoking status: Former Smoker    Packs/day: 0.50    Years: 36.00    Pack years: 18.00    Types: Cigarettes    Quit date: 06/12/2014    Years since quitting: 6.1  . Smokeless tobacco: Never Used  Vaping Use  . Vaping Use: Never used  Substance and Sexual Activity  . Alcohol use: No    Alcohol/week: 0.0 standard drinks  . Drug use: No  . Sexual activity: Not Currently  Other Topics Concern  . Not on file  Social History Narrative   Patient currently lives at home with her disabled husband who was shot in 2009 and he is total care. She lives with her son and daughter and there is an 8 that comes in. Patient currently is on disability   She occasionally drinks caffeine.    Social Determinants of Health   Financial Resource Strain: High Risk  . Difficulty of Paying Living Expenses: Very hard  Food Insecurity: No Food Insecurity  . Worried About Charity fundraiser in the Last Year: Never true  . Ran Out of Food in the Last Year: Never true  Transportation Needs: No Transportation Needs  . Lack of Transportation (Medical): No  . Lack of Transportation (Non-Medical): No   Physical Activity: Inactive  . Days of Exercise per Week: 0 days  . Minutes of Exercise per Session: 0 min  Stress: No Stress Concern Present  . Feeling of Stress : Only a little  Social Connections: Moderately Integrated  . Frequency of Communication with Friends and Family: More than three times a week  . Frequency of Social Gatherings with Friends and Family: More than three times a week  . Attends Religious Services: 1 to 4 times per year  . Active Member of Clubs or Organizations: No  . Attends Archivist Meetings: 1 to 4 times per year  . Marital Status: Widowed  Intimate Partner Violence: Not At Risk  . Fear of Current or Ex-Partner: No  . Emotionally Abused: No  . Physically Abused: No  . Sexually Abused: No     Review of Systems    General:  No chills, fever, night sweats or weight changes.  Cardiovascular:  No chest pain, dyspnea on exertion, edema, orthopnea, palpitations, paroxysmal nocturnal dyspnea. Dermatological: No rash, lesions/masses Respiratory: No cough, dyspnea Urologic: No hematuria, dysuria Abdominal:   No nausea, vomiting, diarrhea, bright red blood per rectum, melena, or hematemesis Neurologic:  No visual changes, wkns, changes in mental status. All other systems reviewed and are otherwise negative except as noted above.  Physical Exam    VS:  BP (!) 140/91   Pulse 81   Ht 5\' 6"  (1.676 m)   Wt 209 lb 12.8 oz (95.2 kg)   SpO2 94%   BMI 33.86 kg/m  , BMI Body mass index is 33.86 kg/m.  GEN: Well nourished, well developed, in no acute distress. HEENT: normal. Neck: Supple, no JVD, carotid bruits, or masses. Cardiac: RRR, no murmurs, rubs, or gallops. No clubbing, cyanosis, edema.  Radials/DP/PT 2+ and equal bilaterally.  Respiratory:  Respirations regular and unlabored, clear to auscultation bilaterally. GI: Soft, nontender, nondistended, BS + x 4. MS: no deformity or atrophy. Skin: warm and dry, no rash. Neuro:  Strength and sensation  are intact. Psych: Normal affect.  Accessory Clinical Findings    Recent Labs: 10/03/2019: ALT 21 04/06/2020: B Natriuretic Peptide 187.5 06/07/2020: Platelets 273 06/21/2020: BUN 16; Creatinine, Ser 0.80; Hemoglobin 16.3; Potassium 4.0; Sodium 141   Recent Lipid Panel    Component Value Date/Time   CHOL 128 10/03/2019 1455   TRIG 211 (H) 10/03/2019 1455   HDL 63 10/03/2019 1455   CHOLHDL 2.0 10/03/2019 1455   CHOLHDL 2.9 10/05/2015 0305   VLDL 28 10/05/2015 0305   LDLCALC 32 10/03/2019 1455    ECG personally reviewed by me today-none today. Cardiac catheterization 10/04/2015  Prox LAD lesion, 85% stenosed. Patent LIMA to LAD.  Severe native 3 vessel CAD.  Lat 1st Mrg-2 lesion, 100% stenosed. Occluded SVG to OM.  Ost RCA to Mid RCA lesion, 70% stenosed. Ocluded SVG to RCA,  Dist RCA lesion, 99% stenosed. Post intervention with a 2.25 x 12 Promus Premier, there is a 0% residual stenosis.  Normal LVEDP.   Switch to Brilinta from Plavix.  She needs aggressive secondary prevention.  Patient with residual ST elevation in the inferior leads but her chest pain has improved significantly.  Proximal RCA disease did not seem critical, compared to distal lesion.   iagnostic Dominance: Right    Intervention      Echocardiogram 03/03/2017  Study Conclusions   - Left ventricle: The cavity size was normal. Wall thickness was  increased in a pattern of mild LVH. Systolic function was mildly  reduced. The estimated ejection fraction was in the range of 45%  to 50%. Severe inferior hypokinesis to akinesis. Doppler  parameters are consistent with abnormal left ventricular  relaxation (grade 1 diastolic dysfunction). The E/e&' ratio is  between 8-15, suggesting indeterminate LV filling pressure.  - Aortic valve: Trileaflet. Sclerosis without stenosis. There was  no regurgitation.  - Mitral valve: Mildly thickened leaflets . There was trivial  regurgitation.  -  Left atrium: The atrium was normal in size.  - Inferior vena cava: The vessel was normal in size. The  respirophasic diameter changes were in the normal range (>= 50%),  consistent with normal central venous pressure.   Impressions:   - Compared to a prior study in 09/2015, the LVEF is lower at 45-50%  - global hypokinesis with inferior wall hypokinesis is noted.   Assessment & Plan   1.  Chest wall pain, back pain, stomach pain- pain reproduced with deep palpation.  Reports she presented to her PCP who did not offer recommendations.    Appears to be related to autoimmune issues.  No plans for ischemic evaluation. Reassured the pain is not related to cardiac issues.    Coronary artery disease- no chest pain today.  Status post CABG 2017 underwent cardiac catheterization 10/04/2015 which showed proximal LAD lesion 85% with patent LIMA-LAD, severe native three-vessel CAD, occluded SVG-OM, occluded SVG-RCA, distal RCA lesion 99%, received DES x1 to distal RCA normal LVEDP.  Her Plavix was transitioned to Brilinta. Continue aspirin, atorvastatin, metoprolol, Imdur, amlodipine Heart healthy low-sodium diet-salty 6 given Increase physical activity as tolerated  Essential hypertension-BP today 140/91.  Well-controlled at home. Having arthralgias and body aches   Continue amlodipine, metoprolol, Imdur Heart healthy low-sodium diet-salty 6 given Increase physical activity as tolerated  Hyperlipidemia-10/03/2019: Cholesterol, Total 128; HDL 63; LDL Chol Calc (NIH) 32; Triglycerides 211 Continue atorvastatin, Repatha Heart healthy low-sodium high-fiber diet Increase physical activity as tolerated  Diabetes mellitus type 2- A1c 7.4 on 04/06/2020 Continue metformin, Victoza Follows with PCP  Disposition: Follow-up with Dr. Martinique in 3-5 months    Jossie Ng. Nataliyah Packham NP-C    07/20/2020, 9:29 AM [address] Office [phone] Fax [phone]  Notice: This dictation was prepared with Dragon dictation along with smaller phrase technology. Any transcriptional errors that result from this process are unintentional and may not be corrected upon review.  I spent 15 minutes examining this patient, reviewing medications, and using patient centered shared decision making involving her cardiac care.  Prior to her visit I spent greater than 20 minutes reviewing her past medical history,  medications, and prior cardiac tests.

## 2020-07-20 ENCOUNTER — Other Ambulatory Visit: Payer: Self-pay

## 2020-07-20 ENCOUNTER — Ambulatory Visit (INDEPENDENT_AMBULATORY_CARE_PROVIDER_SITE_OTHER): Payer: Medicare HMO | Admitting: General Practice

## 2020-07-20 ENCOUNTER — Encounter: Payer: Self-pay | Admitting: General Practice

## 2020-07-20 VITALS — BP 140/91 | HR 81 | Ht 66.0 in | Wt 209.8 lb

## 2020-07-20 DIAGNOSIS — R0789 Other chest pain: Secondary | ICD-10-CM

## 2020-07-20 DIAGNOSIS — Z951 Presence of aortocoronary bypass graft: Secondary | ICD-10-CM

## 2020-07-20 DIAGNOSIS — E785 Hyperlipidemia, unspecified: Secondary | ICD-10-CM

## 2020-07-20 DIAGNOSIS — I25118 Atherosclerotic heart disease of native coronary artery with other forms of angina pectoris: Secondary | ICD-10-CM | POA: Diagnosis not present

## 2020-07-20 DIAGNOSIS — I10 Essential (primary) hypertension: Secondary | ICD-10-CM | POA: Diagnosis not present

## 2020-07-20 NOTE — Patient Instructions (Signed)
Medication Instructions:  The current medical regimen is effective;  continue present plan and medications as directed. Please refer to the Current Medication list given to you today.  *If you need a refill on your cardiac medications before your next appointment, please call your pharmacy*  Lab Work:   Testing/Procedures:  none    none  Special Instructions FLUID RESTRICTION 48-64 OZ OF ALL FLUIDS PER DAY  PLEASE READ AND FOLLOW SALTY 6-ATTACHED-1,800mg  daily  PLEASE INCREASE PHYSICAL ACTIVITY AS TOLERATED  Follow-Up: Your next appointment:  3-5  month(s) In Person with [name], MD OR IF UNAVAILABLE [place], FNP-C   At [facility], you and your health needs are our priority.  As part of our continuing mission to provide you with exceptional heart care, we have created designated Provider Care Teams.  These Care Teams include your primary Cardiologist (physician) and Advanced Practice Providers (APPs -  Physician Assistants and Nurse Practitioners) who all work together to provide you with the care you need, when you need it.            6 SALTY THINGS TO AVOID     1,800MG  DAILY

## 2020-07-30 ENCOUNTER — Ambulatory Visit: Payer: Medicare HMO | Admitting: Cardiology

## 2020-08-02 DIAGNOSIS — Z6834 Body mass index (BMI) 34.0-34.9, adult: Secondary | ICD-10-CM | POA: Diagnosis not present

## 2020-08-02 DIAGNOSIS — E669 Obesity, unspecified: Secondary | ICD-10-CM | POA: Diagnosis not present

## 2020-08-02 DIAGNOSIS — M2550 Pain in unspecified joint: Secondary | ICD-10-CM | POA: Diagnosis not present

## 2020-08-02 DIAGNOSIS — R5383 Other fatigue: Secondary | ICD-10-CM | POA: Diagnosis not present

## 2020-08-02 DIAGNOSIS — Z9119 Patient's noncompliance with other medical treatment and regimen: Secondary | ICD-10-CM | POA: Diagnosis not present

## 2020-08-02 DIAGNOSIS — L4050 Arthropathic psoriasis, unspecified: Secondary | ICD-10-CM | POA: Diagnosis not present

## 2020-08-02 DIAGNOSIS — L409 Psoriasis, unspecified: Secondary | ICD-10-CM | POA: Diagnosis not present

## 2020-08-02 DIAGNOSIS — R768 Other specified abnormal immunological findings in serum: Secondary | ICD-10-CM | POA: Diagnosis not present

## 2020-08-06 DIAGNOSIS — B370 Candidal stomatitis: Secondary | ICD-10-CM | POA: Diagnosis not present

## 2020-08-06 DIAGNOSIS — E1165 Type 2 diabetes mellitus with hyperglycemia: Secondary | ICD-10-CM | POA: Diagnosis not present

## 2020-08-06 DIAGNOSIS — R350 Frequency of micturition: Secondary | ICD-10-CM | POA: Diagnosis not present

## 2020-08-06 DIAGNOSIS — Z7984 Long term (current) use of oral hypoglycemic drugs: Secondary | ICD-10-CM | POA: Diagnosis not present

## 2020-08-14 DIAGNOSIS — E1121 Type 2 diabetes mellitus with diabetic nephropathy: Secondary | ICD-10-CM | POA: Diagnosis not present

## 2020-08-14 DIAGNOSIS — G4700 Insomnia, unspecified: Secondary | ICD-10-CM | POA: Diagnosis not present

## 2020-08-14 DIAGNOSIS — E1165 Type 2 diabetes mellitus with hyperglycemia: Secondary | ICD-10-CM | POA: Diagnosis not present

## 2020-08-14 DIAGNOSIS — F329 Major depressive disorder, single episode, unspecified: Secondary | ICD-10-CM | POA: Diagnosis not present

## 2020-08-14 DIAGNOSIS — K219 Gastro-esophageal reflux disease without esophagitis: Secondary | ICD-10-CM | POA: Diagnosis not present

## 2020-08-14 DIAGNOSIS — I2102 ST elevation (STEMI) myocardial infarction involving left anterior descending coronary artery: Secondary | ICD-10-CM | POA: Diagnosis not present

## 2020-08-14 DIAGNOSIS — I2510 Atherosclerotic heart disease of native coronary artery without angina pectoris: Secondary | ICD-10-CM | POA: Diagnosis not present

## 2020-08-14 DIAGNOSIS — I5033 Acute on chronic diastolic (congestive) heart failure: Secondary | ICD-10-CM | POA: Diagnosis not present

## 2020-08-14 DIAGNOSIS — M059 Rheumatoid arthritis with rheumatoid factor, unspecified: Secondary | ICD-10-CM | POA: Diagnosis not present

## 2020-08-17 DIAGNOSIS — R1310 Dysphagia, unspecified: Secondary | ICD-10-CM | POA: Diagnosis not present

## 2020-08-17 DIAGNOSIS — D122 Benign neoplasm of ascending colon: Secondary | ICD-10-CM | POA: Diagnosis not present

## 2020-08-17 DIAGNOSIS — K2980 Duodenitis without bleeding: Secondary | ICD-10-CM | POA: Diagnosis not present

## 2020-08-17 DIAGNOSIS — B3781 Candidal esophagitis: Secondary | ICD-10-CM | POA: Diagnosis not present

## 2020-08-17 DIAGNOSIS — K635 Polyp of colon: Secondary | ICD-10-CM | POA: Diagnosis not present

## 2020-08-17 DIAGNOSIS — Z86010 Personal history of colonic polyps: Secondary | ICD-10-CM | POA: Diagnosis not present

## 2020-08-17 DIAGNOSIS — K2289 Other specified disease of esophagus: Secondary | ICD-10-CM | POA: Diagnosis not present

## 2020-08-17 DIAGNOSIS — K5730 Diverticulosis of large intestine without perforation or abscess without bleeding: Secondary | ICD-10-CM | POA: Diagnosis not present

## 2020-08-17 DIAGNOSIS — K2930 Chronic superficial gastritis without bleeding: Secondary | ICD-10-CM | POA: Diagnosis not present

## 2020-08-17 DIAGNOSIS — D123 Benign neoplasm of transverse colon: Secondary | ICD-10-CM | POA: Diagnosis not present

## 2020-08-22 DIAGNOSIS — K2289 Other specified disease of esophagus: Secondary | ICD-10-CM | POA: Diagnosis not present

## 2020-08-22 DIAGNOSIS — K635 Polyp of colon: Secondary | ICD-10-CM | POA: Diagnosis not present

## 2020-08-22 DIAGNOSIS — D122 Benign neoplasm of ascending colon: Secondary | ICD-10-CM | POA: Diagnosis not present

## 2020-08-22 DIAGNOSIS — K2930 Chronic superficial gastritis without bleeding: Secondary | ICD-10-CM | POA: Diagnosis not present

## 2020-08-22 DIAGNOSIS — B3781 Candidal esophagitis: Secondary | ICD-10-CM | POA: Diagnosis not present

## 2020-08-22 DIAGNOSIS — D123 Benign neoplasm of transverse colon: Secondary | ICD-10-CM | POA: Diagnosis not present

## 2020-08-22 DIAGNOSIS — K2980 Duodenitis without bleeding: Secondary | ICD-10-CM | POA: Diagnosis not present

## 2020-08-24 DIAGNOSIS — G4700 Insomnia, unspecified: Secondary | ICD-10-CM | POA: Diagnosis not present

## 2020-08-24 DIAGNOSIS — E785 Hyperlipidemia, unspecified: Secondary | ICD-10-CM | POA: Diagnosis not present

## 2020-08-24 DIAGNOSIS — E1165 Type 2 diabetes mellitus with hyperglycemia: Secondary | ICD-10-CM | POA: Diagnosis not present

## 2020-08-24 DIAGNOSIS — E1121 Type 2 diabetes mellitus with diabetic nephropathy: Secondary | ICD-10-CM | POA: Diagnosis not present

## 2020-08-24 DIAGNOSIS — I5033 Acute on chronic diastolic (congestive) heart failure: Secondary | ICD-10-CM | POA: Diagnosis not present

## 2020-08-24 DIAGNOSIS — I2510 Atherosclerotic heart disease of native coronary artery without angina pectoris: Secondary | ICD-10-CM | POA: Diagnosis not present

## 2020-08-24 DIAGNOSIS — I10 Essential (primary) hypertension: Secondary | ICD-10-CM | POA: Diagnosis not present

## 2020-08-24 DIAGNOSIS — K219 Gastro-esophageal reflux disease without esophagitis: Secondary | ICD-10-CM | POA: Diagnosis not present

## 2020-09-20 DIAGNOSIS — H2513 Age-related nuclear cataract, bilateral: Secondary | ICD-10-CM | POA: Diagnosis not present

## 2020-09-20 DIAGNOSIS — H04123 Dry eye syndrome of bilateral lacrimal glands: Secondary | ICD-10-CM | POA: Diagnosis not present

## 2020-09-20 DIAGNOSIS — H524 Presbyopia: Secondary | ICD-10-CM | POA: Diagnosis not present

## 2020-09-20 DIAGNOSIS — H5203 Hypermetropia, bilateral: Secondary | ICD-10-CM | POA: Diagnosis not present

## 2020-09-20 DIAGNOSIS — E1136 Type 2 diabetes mellitus with diabetic cataract: Secondary | ICD-10-CM | POA: Diagnosis not present

## 2020-09-20 DIAGNOSIS — Z7984 Long term (current) use of oral hypoglycemic drugs: Secondary | ICD-10-CM | POA: Diagnosis not present

## 2020-10-25 NOTE — Progress Notes (Signed)
Cardiology Office Note   Date:  10/31/2020   ID:  AUNALEE DEO, DOB [birth year], MRN [id number]  PCP:  Leeroy Cha, MD  Cardiologist:  Arber Wiemers [place], MD EP: None  Chief Complaint  Patient presents with   Coronary Artery Disease       History of Present Illness: [name] [name] is a 60 y.o. female with a PMH of CAD s/p CABG in 2017 with subsequent PCI/DES to dRCA in 2017, HTN, HLD, DM type 2, lupus, and psoriatic arthritis, who presents for  follow-up.  In April 2017 hospitalized with NSTEMI and cath with severe 3-vessel disease with 80% RCA stenosis, 85% Prox LAD stenosis, and 90% 1st Mrg with 85% 2nd Mrg. CABG was recommended and this was performed on 4/4 with the LIMA-LAD, SVG-RCA, and SVG-OM2. She was started on ASA and Plavix.   She presented 10/04/15 to [facility] with complained of chest discomfort for the past 3 weeks. Upon arrival to the ED, she was noted to have ST elevation in the inferior and lateral leads of her EKG. Emergently to cardiac catheterization revealed Patent LIMA to the LAD, occluded SVG to OM, occluded SVG to RCA.  Distal RCA 99% stenosed and she underwent emergent PCI with Promus DES to distal RCA. Her troponin peaked at 14. Plavix changed to [place]. Pt had persistent ST elevation inferiorly and tachycardia. Increased BB. Echo with EF 55-60% inferior hypokinesis and relatively hyperdynamic anterior wall motion.  Zocor changed to lipitor.    She was seen in December 2018 with atypical lateral chest pain, shoulder pain- worse with deep breathing. Troponin levels were normal. She had colonoscopy with polypectomy several days prior. There was mild edema noted on CXR and she was given IV lasix x1. Echo showed EF 45-50% that on my review was not significantly changed from prior. She was observed overnight and DC the next day.  He was  admitted to the hospital from 04/06/20-04/07/20 for accelerated HTN after presenting with chest pain. HsTrop was 23>29 and EKG  non-ischemic with non-specific T wave abnormalities. She was started on amlodipine '5mg'$  daily and imdur was increased to 30 mg daily with improvement in symptoms.   She underwent ureteroscopy for renal stone removal 06/21/20.  She was seen in May 2022 with atypical chest pain and diffuse body aches. No further evaluation pursued.   She reports she has been diagnosed with psoriatic arthritis. Now on prednisone and Skyrizi. Being checked for lupus. Reports she had colonoscopy and EGD and was treated for thrush. She states she has daily hiccups. When she yawns she gets pain across her chest and up into her left face and around her eye. Sometimes her face swells. She is under a lot of stress. Living in a motel since May.   Past Medical History:  Diagnosis Date   Acute ST elevation myocardial infarction (STEMI) involving right coronary artery ([place]) 10/04/2015   x 2   Anxiety    Anxiety and depression    CAD (coronary artery disease)    a. CABG 06/2015: LIMA-LAD, SVG-RCA, SVG-OM2 b. STEMI s/p PCI to distal RCA lesion with a small Promus Premier stent. ( patent LIMA to the LAD, occluded SVG to OM, occluded SVG to RCA.)   CAD (coronary artery disease) of artery bypass graft; occluded SVG to RCA and occluded SVG to OM 10/08/2015   Empty sella (HCC)    GERD (gastroesophageal reflux disease)    Headache    from stress   Hematuria wed 06-13-2020  Hidradenitis axillaris    History of kidney stones    Hyperlipemia    Hypertension    Lupus ([place]) dx'd 2008   "they think I have this; have to get retested" (06/12/2015)   Obesity (BMI 30.0-34.9)    Pneumonia X 2   last time yrs ago   PONV (postoperative nausea and vomiting)    likes iv med or scopolamine patch works well   Psoriasis 06/15/2020   hands elbows, ears, small amount on scalp and bridge of nose   Rheumatoid aortitis    psorriatic arthritis   Rheumatoid arthritis ([place])    S/P angioplasty with stent; 10/04/15 to distal RCA lesion. with Promus  premier. 10/08/2015   Scoliosis    Sleep apnea    patient states "it is mild" no cpap needed   Stroke [facility]) March 2014   left MCA branches; "they say I have them all the time", denies residual on 06/12/2015   Tobacco abuse    Type II diabetes mellitus ([place])    Urinary frequency    and urgency   Wears glasses    for reading    Past Surgical History:  Procedure Laterality Date   ABDOMINAL HYSTERECTOMY  31 yrs ago   total has ovaies   BREAST BIOPSY Bilateral yrd ago   benign   CARDIAC CATHETERIZATION N/A 06/13/2015   Procedure: Left Heart Cath and Coronary Angiography;  Surgeon: Corleone Biegler M [place], MD;  Location: [place] [facility];  Service: Cardiovascular;  Laterality: N/A;   CARDIAC CATHETERIZATION N/A 10/04/2015   Procedure: Left Heart Cath and Coronary Angiography;  Surgeon: [name], MD;  Location: [place] [facility];  Service: Cardiovascular;  Laterality: N/A;   CARDIAC CATHETERIZATION N/A 10/04/2015   Procedure: Coronary Stent Intervention;  Surgeon: [name], MD;  Location: [place] [facility];  Service: Cardiovascular;  Laterality: N/A;   CORONARY ARTERY BYPASS GRAFT N/A 06/19/2015   Procedure: CORONARY ARTERY BYPASS GRAFTING (CABG) TIMES FOUR USING LEFT INTERNAL MAMMARY, RIGHT SAPHENOUS LEG VEIN AND CRYO SAPHENOUS VEIN;  Surgeon: [name], MD;  Location: [place];  Service: Open Heart Surgery;  Laterality: N/A;   CYSTOSCOPY WITH RETROGRADE PYELOGRAM, URETEROSCOPY AND STENT PLACEMENT Left 06/21/2020   Procedure: CYSTOSCOPY WITH RETROGRADE PYELOGRAM, URETEROSCOPY, BASKET STONE EXTRACTION AND  STENT PLACEMENT;  Surgeon: Robley Fries, MD;  Location: [facility];  Service: Urology;  Laterality: Left;   DILATION AND CURETTAGE OF UTERUS  yrs ago   HOLMIUM LASER APPLICATION Left [id number]   Procedure: HOLMIUM LASER APPLICATION;  Surgeon: Robley Fries, MD;  Location: [facility];  Service: Urology;  Laterality: Left;   HYDRADENITIS  EXCISION Right 12/20/2019   Procedure: EXCISION OF RIGHT  HIDRADENITIS AXILLARY;  Surgeon: Donnie Mesa, MD;  Location: [place];  Service: General;  Laterality: Right;   IR URETERAL STENT LEFT NEW ACCESS W/O SEP NEPHROSTOMY CATH  01/10/2019   NEPHROLITHOTOMY Left 01/10/2019   Procedure: NEPHROLITHOTOMY PERCUTANEOUS;  Surgeon: Kathie Rhodes, MD;  Location: [facility];  Service: Urology;  Laterality: Left;   TEE WITHOUT CARDIOVERSION N/A 06/19/2015   Procedure: TRANSESOPHAGEAL ECHOCARDIOGRAM (TEE);  Surgeon: [name], MD;  Location: [place];  Service: Open Heart Surgery;  Laterality: N/A;   TONSILLECTOMY  age 67   TUBAL LIGATION  many yrs ago     Current Outpatient Medications  Medication Sig Dispense Refill   amLODipine (NORVASC) 5 MG tablet Take 1 tablet (5 mg total) by mouth daily. 90 tablet  3   aspirin EC 81 MG EC tablet Take 1 tablet (81 mg total) by mouth daily.     atorvastatin (LIPITOR) 80 MG tablet TAKE 1 TABLET EVERY DAY  AT  6PM (Patient taking differently: Take 80 mg by mouth daily at 6 PM.) 90 tablet 6   Biotin w/ Vitamins C & E (HAIR/SKIN/NAILS PO) Take 1-2 tablets by mouth See admin instructions. Take 1 tablet in the morning & take 2 tablets in the evening.     cetirizine (ZYRTEC) 10 MG tablet Take 10 mg by mouth daily.  2   cholecalciferol (VITAMIN D) 25 MCG (1000 UNIT) tablet Take 1,000 Units by mouth in the morning and at bedtime.     Evolocumab with Infusor ([place]) 420 MG/3.5ML SOCT Inject 420 mg into the skin every 30 (thirty) days. 3.6 mL 11   famotidine (PEPCID) 20 MG tablet Take 20 mg by mouth at bedtime as needed for heartburn or indigestion.     guaiFENesin (MUCINEX) 600 MG 12 hr tablet Take 600 mg by mouth 2 (two) times daily as needed (congestion.).     isosorbide mononitrate (IMDUR) 30 MG 24 hr tablet Take 1 tablet (30 mg total) by mouth daily. 90 tablet 3   liraglutide (VICTOZA) 18 MG/3ML SOPN Inject 1.8 mg into the skin at bedtime.      metFORMIN  (GLUCOPHAGE) 1000 MG tablet Take 1,000 mg by mouth 2 (two) times daily.     metoprolol tartrate (LOPRESSOR) 25 MG tablet Take one and a half (1.5) tablets (37.5 mg) by mouth twice (2) daily. (Patient taking differently: Take 37.5 mg by mouth 2 (two) times daily.) 270 tablet 1   Multiple Vitamin (MULTIVITAMIN WITH MINERALS) TABS tablet Take 1 tablet by mouth daily. Centrum Silver     nitroGLYCERIN (NITROSTAT) 0.4 MG SL tablet DISSOLVE 1 TABLET UNDER THE TONGUE EVERY 5 MINUTES AS  NEEDED FOR CHEST PAIN. MAX  OF 3 TABLETS IN 15 MINUTES. CALL 911 IF PAIN PERSISTS. (Patient taking differently: Place 0.4 mg under the tongue every 5 (five) minutes as needed for chest pain.) 100 tablet 3   Potassium Citrate 15 MEQ (1620 MG) TBCR Take 15 mg by mouth in the morning and at bedtime.     predniSONE (DELTASONE) 5 MG tablet Take 5 mg by mouth daily with breakfast. TAKE 1-2 TABLET ONCE A DAY FOR 30 DAYS     Risankizumab-rzaa (SKYRIZI) 150 MG/ML SOSY      tamsulosin (FLOMAX) 0.4 MG CAPS capsule Take 1 capsule (0.4 mg total) by mouth daily after supper. 7 capsule 0   triamcinolone (KENALOG) 0.025 % cream Apply 1 application topically 2 (two) times daily.     No current facility-administered medications for this visit.    Allergies:   Patient has no known allergies.    Social History:  The patient  reports that she quit smoking about 6 years ago. Her smoking use included cigarettes. She has a 18.00 pack-year smoking history. She has never used smokeless tobacco. She reports that she does not drink alcohol and does not use drugs.   Family History:  The patient's  family history includes Alzheimer's disease in an other family member; Breast cancer in her mother and another family member; Depression in her son; Diabetes in her mother and another family member; Heart failure in her mother; Stroke in her maternal aunt.    ROS:  Please see the history of present illness.   Otherwise, review of systems are positive for  none.   All other systems are reviewed and negative.    PHYSICAL EXAM: VS:  BP (!) 143/88   Pulse 88   Ht 5' 6.25" (1.683 m)   Wt 200 lb 9.6 oz (91 kg)   SpO2 97%   BMI 32.13 kg/m  , BMI Body mass index is 32.13 kg/m. GEN: Well nourished, well developed, in no acute distress HEENT: sclera anicteric Neck: no JVD, carotid bruits, or masses Cardiac: RRR; no murmurs, rubs, or gallops,no edema  Respiratory:  clear to auscultation bilaterally, normal work of breathing GI: soft, nontender, nondistended, + BS MS: no deformity or atrophy Skin: warm and dry, no rash Neuro:  Strength and sensation are intact Psych: euthymic mood, full affect   EKG:  EKG is ordered today. NSR rate 84, nonspecific TWA. Unchanged from Jan. I have personally reviewed and interpreted this study.    Recent Labs: 04/06/2020: B Natriuretic Peptide 187.5 06/07/2020: Platelets 273 06/21/2020: BUN 16; Creatinine, Ser 0.80; Hemoglobin 16.3; Potassium 4.0; Sodium 141    Lipid Panel    Component Value Date/Time   CHOL 128 10/03/2019 1455   TRIG 211 (H) 10/03/2019 1455   HDL 63 10/03/2019 1455   CHOLHDL 2.0 10/03/2019 1455   CHOLHDL 2.9 10/05/2015 0305   VLDL 28 10/05/2015 0305   LDLCALC 32 10/03/2019 1455   Dated 12/06/19: cholesterol 119, triglycerides 163, HDL 67, LDL 25.   Dated 06/13/20: A1c 9.4%.   Wt Readings from Last 3 Encounters:  10/31/20 200 lb 9.6 oz (91 kg)  07/20/20 209 lb 12.8 oz (95.2 kg)  06/21/20 214 lb 1.6 oz (97.1 kg)      Other studies Reviewed: Additional studies/ records that were reviewed today include:   Echocardiogram 02/2017: - Left ventricle: The cavity size was normal. Wall thickness was    increased in a pattern of mild LVH. Systolic function was mildly    reduced. The estimated ejection fraction was in the range of 45%    to 50%. Severe inferior hypokinesis to akinesis. Doppler    parameters are consistent with abnormal left ventricular    relaxation (grade 1 diastolic  dysfunction). The E/e&' ratio is    between 8-15, suggesting indeterminate LV filling pressure.  - Aortic valve: Trileaflet. Sclerosis without stenosis. There was    no regurgitation.  - Mitral valve: Mildly thickened leaflets . There was trivial    regurgitation.  - Left atrium: The atrium was normal in size.  - Inferior vena cava: The vessel was normal in size. The    respirophasic diameter changes were in the normal range (>= 50%),    consistent with normal central venous pressure.   Impressions:   - Compared to a prior study in 09/2015, the LVEF is lower at 45-50%    - global hypokinesis with inferior wall hypokinesis is noted.   LHC 2017: Prox LAD lesion, 85% stenosed. Patent LIMA to LAD. Severe native 3 vessel CAD. Lat 1st Mrg-2 lesion, 100% stenosed. Occluded SVG to OM. Ost RCA to Mid RCA lesion, 70% stenosed. Ocluded SVG to RCA, Dist RCA lesion, 99% stenosed. Post intervention with a 2.25 x 12 Promus Premier, there is a 0% residual stenosis. Normal LVEDP.   Switch to Brilinta from Plavix.  She needs aggressive secondary prevention.  Patient with residual ST elevation in the inferior leads but her chest pain has improved significantly.  Proximal RCA disease did not seem critical, compared to distal lesion.   Diagnostic Dominance: Right    Intervention  ASSESSMENT AND PLAN:  1. CAD s/p CABG in 2017: she had early graft failure with subsequent PCI/DES to LAD in 2017.  - Continue aspirin and statin - Continue metoprolol and imdur - having atypical chest pain that does not sound cardiac. Ecg is unchanged.   2. HTN: continue current therapy  3. HLD: LDL 32 09/2019 - Continue atorvastatin and repatha  4. DM type 2:  - Continue metformin and victoza per PCP  5. Psoriatic arthritis.    Current medicines are reviewed at length with the patient today.  The patient does not have concerns regarding medicines.  The following changes have been made:  As  above  Labs/ tests ordered today include:  No orders of the defined types were placed in this encounter.    Disposition:   FU with APP in 6  months  Signed, Aaiden Depoy [place], MD  10/31/2020 2:22 PM

## 2020-10-31 ENCOUNTER — Telehealth: Payer: Self-pay | Admitting: Licensed Clinical Social Worker

## 2020-10-31 ENCOUNTER — Ambulatory Visit (INDEPENDENT_AMBULATORY_CARE_PROVIDER_SITE_OTHER): Payer: Medicare HMO | Admitting: Cardiology

## 2020-10-31 ENCOUNTER — Encounter: Payer: Self-pay | Admitting: Cardiology

## 2020-10-31 ENCOUNTER — Other Ambulatory Visit: Payer: Self-pay

## 2020-10-31 VITALS — BP 143/88 | HR 88 | Ht 66.25 in | Wt 200.6 lb

## 2020-10-31 DIAGNOSIS — Z951 Presence of aortocoronary bypass graft: Secondary | ICD-10-CM | POA: Diagnosis not present

## 2020-10-31 DIAGNOSIS — I10 Essential (primary) hypertension: Secondary | ICD-10-CM | POA: Diagnosis not present

## 2020-10-31 DIAGNOSIS — E785 Hyperlipidemia, unspecified: Secondary | ICD-10-CM | POA: Diagnosis not present

## 2020-10-31 DIAGNOSIS — E118 Type 2 diabetes mellitus with unspecified complications: Secondary | ICD-10-CM

## 2020-10-31 DIAGNOSIS — I25118 Atherosclerotic heart disease of native coronary artery with other forms of angina pectoris: Secondary | ICD-10-CM | POA: Diagnosis not present

## 2020-10-31 NOTE — Telephone Encounter (Signed)
Called pt to address concerns expressed during appt today around food and finances. LCSW reached out to pt at [phone]. Pt answered, shares she is on bus home but welcomes a call in about 20-30 minutes.   Westley Hummer, MSW, [place]  [phone]

## 2020-11-01 NOTE — Progress Notes (Signed)
Heart and Vascular Care Navigation  11/01/2020  [name] [name] [birth year] [id number]  Reason for Referral:    Engaged with patient by telephone for initial visit for Heart and Vascular Care Coordination.                                                                                                   Assessment:                                     LCSW received referral from [name] [name], CMA, who noted that pt having difficulty obtaining food, affording her housing and getting around. LCSW reviewed chart, previous assessments have been completed by [facility] at the beginning of this year. Was able to reach her again at [phone]. Pt able to receive calls and texts to this number. I introduced self, role, reason for call. Pt confirmed mailing address and shares she stays at the [facility] and [place] ([name], [place], [place], [address]). Her emergency contact remains her son [name] [name], and she does utilize her e-mail ([email or web address]). Her son lives with her intermittently and has since her husband died; does assist with payment for the room. They pay $361.99 weekly.   Pt does have insurance coverage through [facility], she utilizes their mail order pharmacy to get her medications and denies issues with that at this time. She utilizes [employer] to get to appointments and any job assignments that she gets. She expresses that she could utilize some additional assistance with getting to daily needs like grocery store etc. She works part time as a Sports coach but shares that her work depends on [employer] clients not cancelling. She used to have a consistent client but they not longer utilize services, she also is hesitant taking some jobs since [place]. She was approved for [facility] but does not receive that until the 21st and could use some connection to food for the week.   Finances are tight as other than the weekly rent at hotel she also has two storage units ($285 each), two loans, and "a few  other bills." She was previously approved for housing assistance/loans but had trouble finding properties that were affordable and in appropriate shape to take a chance on. She is upbeat despite all these challenges and has a "game plan"- which includes getting on to the waitlist for [employer]. She has been working with [employer] and additional partner agencies. She is open to any/all assistance that our team can provide. She has my number for any additional questions/concerns that may arise.     HRT/VAS Care Coordination     Patients Home Cardiology Office [place] Team Social Worker   Social Worker Name: [name] [name] [place], [phone]   Living arrangements for the past 2 months Apartment   Lives with: Adult Children   Patient Current Insurance Coverage Managed Medicare   Patient Has Concern With [place] Yes   Patient Concerns With [place] has  some challenges w/ copays- pays for what she can   Medical Bill Referrals: not eligible for CAFA at this time, aware to call and schedule payment plans as needed   Does Patient Have Prescription Coverage? Yes  gets all medications through mail pharmacy, no issues at this time   Home Assistive Devices/Equipment CBG Meter       Social History:                                                                             SDOH Screenings   Alcohol Screen: Low Risk    Last Alcohol Screening Score (AUDIT): 0  Depression (PHQ2-9): Low Risk    PHQ-2 Score: 0  Financial Resource Strain: High Risk   Difficulty of Paying Living Expenses: Very hard  Food Insecurity: Food Insecurity Present   Worried About Charity fundraiser in the Last Year: Sometimes true   Ran Out of Food in the Last Year: Sometimes true  Housing: Medium Risk   Last Housing Risk Score: 1  Physical Activity: Inactive   Days of Exercise per Week: 0 days   Minutes of Exercise per Session: 0 min   Social Connections: Moderately Integrated   Frequency of Communication with Friends and Family: More than three times a week   Frequency of Social Gatherings with Friends and Family: More than three times a week   Attends Religious Services: 1 to 4 times per year   Active Member of [employer] or Organizations: No   Attends Archivist Meetings: 1 to 4 times per year   Marital Status: Widowed  Stress: No Stress Concern Present   Feeling of Stress : Only a little  Tobacco Use: Medium Risk   Smoking Tobacco Use: Former   Smokeless Tobacco Use: Never  Transportation Needs: Public librarian (Medical): No   Lack of Transportation (Non-Medical): Yes    SDOH Interventions: Financial Resources:   DSS for financial assistance  Food Insecurity:  Food Insecurity Interventions: Other (Comment) (referral to [facility] [employer]; pt has been approved for [employer])  Housing Insecurity:  Housing Interventions: Other (Comment) (pt connected with various community agencies including [place])  Transportation:   Transportation Interventions: [employer], [employer] (Paediatric nurse)    Follow-up plan:   LCSW has sent pt referral to [employer] (also inquired on behalf of patient about ride to upcoming appt to [place]). I also have sent pt the following resources: Housing and [employer] from the county and our clinic, [employer], and additional Transportation options. LCSW also connected pt with Jannet Mantis from [employer] to ensure that pt has food for this week. LCSW remains available. I will f/u with pt next week to ensure documents received and address any additional challenges.

## 2020-11-02 ENCOUNTER — Telehealth: Payer: Self-pay | Admitting: Internal Medicine

## 2020-11-02 NOTE — Telephone Encounter (Signed)
   [name] [name] DOB: 03-30-1960 MRN: [id number]   RIDER WAIVER AND RELEASE OF LIABILITY  For purposes of improving physical access to our facilities, [facility] is pleased to partner with third parties to provide [facility] patients or other authorized individuals the option of convenient, on-demand ground transportation services (the Technical brewer") through use of the technology service that enables users to request on-demand ground transportation from independent third-party providers.  By opting to use and accept these [employer], I, the undersigned, hereby agree on behalf of myself, and on behalf of any minor child using the Government social research officer for whom I am the parent or legal guardian, as follows:  Government social research officer provided to me are provided by independent third-party transportation providers who are not [employer] or employees and who are unaffiliated with [employer]. [place] is neither a transportation carrier nor a common or public carrier. [facility] has no control over the quality or safety of the transportation that occurs as a result of the [employer]. [facility] cannot guarantee that any third-party transportation provider will complete any arranged transportation service. [facility] makes no representation, warranty, or guarantee regarding the reliability, timeliness, quality, safety, suitability, or availability of any of the Transport Services or that they will be error free. I fully understand that traveling by vehicle involves risks and dangers of serious bodily injury, including permanent disability, paralysis, and death. I agree, on behalf of myself and on behalf of any minor child using the Transport Services for whom I am the parent or legal guardian, that the entire risk arising out of my use of the [employer] remains solely with me, to the maximum extent permitted under applicable law. The [employer] are provided "as  is" and "as available." [facility] disclaims all representations and warranties, express, implied or statutory, not expressly set out in these terms, including the implied warranties of merchantability and fitness for a particular purpose. I hereby waive and release [facility], its agents, employees, officers, directors, representatives, insurers, attorneys, assigns, successors, subsidiaries, and affiliates from any and all past, present, or future claims, demands, liabilities, actions, causes of action, or suits of any kind directly or indirectly arising from acceptance and use of the [employer]. I further waive and release [facility] and its affiliates from all present and future liability and responsibility for any injury or death to persons or damages to property caused by or related to the use of the [employer]. I have read this Waiver and Release of Liability, and I understand the terms used in it and their legal significance. This Waiver is freely and voluntarily given with the understanding that my right (as well as the right of any minor child for whom I am the parent or legal guardian using the [employer]) to legal recourse against  in connection with the [employer] is knowingly surrendered in return for use of these services.   I attest that I read the consent document to [name] [name], gave [name] [name] the opportunity to ask questions and answered the questions asked (if any). I affirm that [name] [name] then provided consent for she's participation in this program.     [name] [name]

## 2020-11-07 ENCOUNTER — Telehealth: Payer: Self-pay | Admitting: Licensed Clinical Social Worker

## 2020-11-07 NOTE — Telephone Encounter (Signed)
LCSW reached out to pt today to f/u on resources sent last week. Pt shares she has not yet received them by mail but is still open to receiving them via email. Email with previous resources (housing/utility assistance, transportation, food assistance, community guide) sent to [email or web address]  Westley Hummer, MSW, [place]  [phone]

## 2020-11-21 DIAGNOSIS — L4050 Arthropathic psoriasis, unspecified: Secondary | ICD-10-CM | POA: Diagnosis not present

## 2020-11-21 DIAGNOSIS — Z6832 Body mass index (BMI) 32.0-32.9, adult: Secondary | ICD-10-CM | POA: Diagnosis not present

## 2020-11-21 DIAGNOSIS — L409 Psoriasis, unspecified: Secondary | ICD-10-CM | POA: Diagnosis not present

## 2020-11-21 DIAGNOSIS — E669 Obesity, unspecified: Secondary | ICD-10-CM | POA: Diagnosis not present

## 2020-11-21 DIAGNOSIS — M150 Primary generalized (osteo)arthritis: Secondary | ICD-10-CM | POA: Diagnosis not present

## 2020-11-21 DIAGNOSIS — Z79899 Other long term (current) drug therapy: Secondary | ICD-10-CM | POA: Diagnosis not present

## 2020-11-23 DIAGNOSIS — I10 Essential (primary) hypertension: Secondary | ICD-10-CM | POA: Diagnosis not present

## 2020-11-23 DIAGNOSIS — M059 Rheumatoid arthritis with rheumatoid factor, unspecified: Secondary | ICD-10-CM | POA: Diagnosis not present

## 2020-11-23 DIAGNOSIS — F4321 Adjustment disorder with depressed mood: Secondary | ICD-10-CM | POA: Diagnosis not present

## 2020-11-23 DIAGNOSIS — E1165 Type 2 diabetes mellitus with hyperglycemia: Secondary | ICD-10-CM | POA: Diagnosis not present

## 2020-11-23 DIAGNOSIS — Z7984 Long term (current) use of oral hypoglycemic drugs: Secondary | ICD-10-CM | POA: Diagnosis not present

## 2020-11-23 DIAGNOSIS — I2510 Atherosclerotic heart disease of native coronary artery without angina pectoris: Secondary | ICD-10-CM | POA: Diagnosis not present

## 2020-12-19 DIAGNOSIS — E1165 Type 2 diabetes mellitus with hyperglycemia: Secondary | ICD-10-CM | POA: Diagnosis not present

## 2020-12-19 DIAGNOSIS — M059 Rheumatoid arthritis with rheumatoid factor, unspecified: Secondary | ICD-10-CM | POA: Diagnosis not present

## 2020-12-19 DIAGNOSIS — Z1231 Encounter for screening mammogram for malignant neoplasm of breast: Secondary | ICD-10-CM | POA: Diagnosis not present

## 2020-12-19 DIAGNOSIS — Z7984 Long term (current) use of oral hypoglycemic drugs: Secondary | ICD-10-CM | POA: Diagnosis not present

## 2020-12-19 DIAGNOSIS — F4321 Adjustment disorder with depressed mood: Secondary | ICD-10-CM | POA: Diagnosis not present

## 2020-12-19 DIAGNOSIS — Z23 Encounter for immunization: Secondary | ICD-10-CM | POA: Diagnosis not present

## 2020-12-19 DIAGNOSIS — H60501 Unspecified acute noninfective otitis externa, right ear: Secondary | ICD-10-CM | POA: Diagnosis not present

## 2020-12-19 DIAGNOSIS — Z1211 Encounter for screening for malignant neoplasm of colon: Secondary | ICD-10-CM | POA: Diagnosis not present

## 2020-12-19 DIAGNOSIS — I10 Essential (primary) hypertension: Secondary | ICD-10-CM | POA: Diagnosis not present

## 2020-12-19 DIAGNOSIS — Z0000 Encounter for general adult medical examination without abnormal findings: Secondary | ICD-10-CM | POA: Diagnosis not present

## 2020-12-19 DIAGNOSIS — I2510 Atherosclerotic heart disease of native coronary artery without angina pectoris: Secondary | ICD-10-CM | POA: Diagnosis not present

## 2020-12-25 ENCOUNTER — Ambulatory Visit: Payer: Medicare HMO | Admitting: Podiatry

## 2020-12-25 ENCOUNTER — Other Ambulatory Visit: Payer: Self-pay

## 2020-12-25 ENCOUNTER — Encounter: Payer: Self-pay | Admitting: Podiatry

## 2020-12-25 DIAGNOSIS — M2141 Flat foot [pes planus] (acquired), right foot: Secondary | ICD-10-CM | POA: Diagnosis not present

## 2020-12-25 DIAGNOSIS — M2042 Other hammer toe(s) (acquired), left foot: Secondary | ICD-10-CM | POA: Diagnosis not present

## 2020-12-25 DIAGNOSIS — M79675 Pain in left toe(s): Secondary | ICD-10-CM

## 2020-12-25 DIAGNOSIS — M79674 Pain in right toe(s): Secondary | ICD-10-CM | POA: Diagnosis not present

## 2020-12-25 DIAGNOSIS — M2041 Other hammer toe(s) (acquired), right foot: Secondary | ICD-10-CM | POA: Diagnosis not present

## 2020-12-25 DIAGNOSIS — E119 Type 2 diabetes mellitus without complications: Secondary | ICD-10-CM | POA: Diagnosis not present

## 2020-12-25 DIAGNOSIS — M2142 Flat foot [pes planus] (acquired), left foot: Secondary | ICD-10-CM

## 2020-12-25 DIAGNOSIS — E1165 Type 2 diabetes mellitus with hyperglycemia: Secondary | ICD-10-CM | POA: Diagnosis not present

## 2020-12-25 DIAGNOSIS — B351 Tinea unguium: Secondary | ICD-10-CM

## 2020-12-25 NOTE — Patient Instructions (Addendum)
Diabetes Mellitus and Foot Care Foot care is an important part of your health, especially when you have diabetes. Diabetes may cause you to have problems because of poor blood flow (circulation) to your feet and legs, which can cause your skin to: Become thinner and drier. Break more easily. Heal more slowly. Peel and crack. You may also have nerve damage (neuropathy) in your legs and feet, causing decreased feeling in them. This means that you may not notice minor injuries to your feet that could lead to more serious problems. Noticing and addressing any potential problems early is the best way to prevent future foot problems. How to care for your feet Foot hygiene  Wash your feet daily with warm water and mild soap. Do not use hot water. Then, pat your feet and the areas between your toes until they are completely dry. Do not soak your feet as this can dry your skin. Trim your toenails straight across. Do not dig under them or around the cuticle. File the edges of your nails with an emery board or nail file. Apply a moisturizing lotion or petroleum jelly to the skin on your feet and to dry, brittle toenails. Use lotion that does not contain alcohol and is unscented. Do not apply lotion between your toes. Shoes and socks Wear clean socks or stockings every day. Make sure they are not too tight. Do not wear knee-high stockings since they may decrease blood flow to your legs. Wear shoes that fit properly and have enough cushioning. Always look in your shoes before you put them on to be sure there are no objects inside. To break in new shoes, wear them for just a few hours a day. This prevents injuries on your feet. Wounds, scrapes, corns, and calluses  Check your feet daily for blisters, cuts, bruises, sores, and redness. If you cannot see the bottom of your feet, use a mirror or ask someone for help. Do not cut corns or calluses or try to remove them with medicine. If you find a minor scrape,  cut, or break in the skin on your feet, keep it and the skin around it clean and dry. You may clean these areas with mild soap and water. Do not clean the area with peroxide, alcohol, or iodine. If you have a wound, scrape, corn, or callus on your foot, look at it several times a day to make sure it is healing and not infected. Check for: Redness, swelling, or pain. Fluid or blood. Warmth. Pus or a bad smell. General tips Do not cross your legs. This may decrease blood flow to your feet. Do not use heating pads or hot water bottles on your feet. They may burn your skin. If you have lost feeling in your feet or legs, you may not know this is happening until it is too late. Protect your feet from hot and cold by wearing shoes, such as at the beach or on hot pavement. Schedule a complete foot exam at least once a year (annually) or more often if you have foot problems. Report any cuts, sores, or bruises to your health care provider immediately. Where to find more information American Diabetes Association: www.diabetes.org Association of Diabetes Care & Education Specialists: www.diabeteseducator.org Contact a health care provider if: You have a medical condition that increases your risk of infection and you have any cuts, sores, or bruises on your feet. You have an injury that is not healing. You have redness on your legs or feet. You   feel burning or tingling in your legs or feet. You have pain or cramps in your legs and feet. Your legs or feet are numb. Your feet always feel cold. You have pain around any toenails. Get help right away if: You have a wound, scrape, corn, or callus on your foot and: You have pain, swelling, or redness that gets worse. You have fluid or blood coming from the wound, scrape, corn, or callus. Your wound, scrape, corn, or callus feels warm to the touch. You have pus or a bad smell coming from the wound, scrape, corn, or callus. You have a fever. You have a red  line going up your leg. Summary Check your feet every day for blisters, cuts, bruises, sores, and redness. Apply a moisturizing lotion or petroleum jelly to the skin on your feet and to dry, brittle toenails. Wear shoes that fit properly and have enough cushioning. If you have foot problems, report any cuts, sores, or bruises to your health care provider immediately. Schedule a complete foot exam at least once a year (annually) or more often if you have foot problems. This information is not intended to replace advice given to you by your health care provider. Make sure you discuss any questions you have with your health care provider. Document Revised: 09/22/2019 Document Reviewed: 09/22/2019 Elsevier Patient Education  2022 [place], Adult Flat feet is a common condition in which there is no curve, or arch, on the inner sides of the feet. Normally, an adult foot has an arch. The arch creates a gap between the foot and the ground. This condition can occur in one foot or in both feet. What are the causes? This condition may be caused by: An injury to tendons and ligaments in the foot, such as to the tendon that supports the arch (posterior tibial tendon). Tendons are tissues that connect muscle to bone, and ligaments are tissues that connect bones to each other. Loose tendons or ligaments in your foot. A wearing down of your arch over time. Injury to bones in your foot. An abnormality in the bones of your foot called tarsal coalition. This happens when two or more bones in the foot are joined together (fused). Tarsal coalition is often present at birth, but signs of it typically do not show until the early teen years. What increases the risk? The following factors may make you more likely to develop this condition: Being an adult age 54 or older. Having a family history of flat feet or a history of childhood flexible flatfoot. Having obesity, diabetes, or high blood  pressure. Taking part in high-impact sports. Having inflammatory arthritis. Having had any of these problems with the bones in your foot: A broken bone (fracture). Bones that were moved out of place (dislocated). Achilles tendon injuries. What are the signs or symptoms? Symptoms of this condition include: Pain or tightness along the bottom of your foot. Foot pain that gets worse with activity. Swelling of the inner side of your foot or swelling of your ankle. Pain on the outer side of your ankle. Changes in gait. Your gait is the way that you walk. Pronation. This is when the foot and ankle lean inward when you are standing. Bony bumps on the top or inner side of your foot. How is this diagnosed? This condition is diagnosed with a physical exam of your foot and ankle. Your health care provider may also: Check your shoes for patterns of wear on the soles. Order  imaging tests, such as X-rays to see the bone structure. Refer you to a health care provider who specializes in feet (podiatrist). How is this treated? This condition may be treated with: Rest and ice to decrease inflammation and pain in the affected foot. Stretching or physical therapy exercises. These are done to: Improve movement and strength in your foot. Increase range of motion and relieve pain. A shoe insert (orthotic insert) for one foot or both feet. This helps to support the arch of your foot. An orthotic insert or inserts can be purchased from a store or can be custom-made. Wearing shoes with appropriate arch support. This is especially important for athletes. Medicines. These may be prescribed or injected into the affected foot to relieve pain. An ankle boot or cast or a foot or leg brace to relieve pressure on your affected foot. Surgery. This may be done to improve the alignment of your foot. Surgery is needed only if your posterior tibial tendon is torn or if you have tarsal coalition. Follow these instructions at  home: Managing pain, stiffness, and swelling If directed, put ice on the painful area. To do this: If you have a removable brace or boot, remove it as told by your health care provider. Put ice in a plastic bag. Place a towel between your skin and the bag or between your cast and the bag. Leave the ice on for 20 minutes, 2-3 times a day. Remove the ice if your skin turns bright red. This is very important. If you cannot feel pain, heat, or cold, you have a greater risk of damage to the area. Move your toes often to reduce stiffness and swelling. Raise (elevate) the injured area above the level of your heart while you are sitting or lying down. Activity Do exercises as told by your health care provider, if they were prescribed. If an activity causes pain, avoid it or try to find another activity that does not cause pain. General instructions Wear an orthotic insert and appropriate shoes as told by your health care provider. Take over-the-counter and prescription medicines only as told by your health care provider. Wear a brace, boot, or cast as told by your health care provider. Keep all follow-up visits. This is important. How is this prevented? To prevent the condition from getting worse: Wear comfortable, supportive shoes that are appropriate for your activities. Maintain a healthy weight. Stay active in a way that your health care provider recommends. This will help to keep your feet flexible and strong. Manage long-term (chronic) health conditions, such as diabetes, high blood pressure, and inflammatory arthritis. Work with a health care provider if you have concerns about your feet or shoes. Contact a health care provider if you have: Pain in your foot or lower leg that gets worse or does not improve with medicine. Pain or trouble when walking. Problems with your orthotic insert. Summary Flat feet is a common condition in which there is no curve, or arch, on the inner sides of the  feet. This condition can occur in one foot or in both feet. Your health care provider may recommend a shoe insert (orthotic insert) for one foot or both feet, or shoes with the appropriate arch support. Other treatments may include stretching or physical therapy exercises, pain medicines, and wearing a foot or leg brace or an ankle boot or cast. Surgery may be done if you have a tear in the tendon that supports your arch (posterior tibial tendon) or if two or  more of your foot bones were joined together, or fused,before birth (tarsal coalition). This information is not intended to replace advice given to you by your health care provider. Make sure you discuss any questions you have with your health care provider. Document Revised: 08/11/2019 Document Reviewed: 08/11/2019 Elsevier Patient Education  2022 [employer].

## 2020-12-29 NOTE — Progress Notes (Signed)
Subjective: [name] [name] presents today referred by Leeroy Cha, MD for diabetic foot evaluation.  Patient relates 8 year history of diabetes.  Patient denies any history of foot wounds.  She has h/o psoriatic arthritis. She has been on prednisone.  She states her A1c was 14.  She has been in transition with housing, stating she's lived in a motel for the past year. Recently moved in with her brother and feels confident she will be able to manage her health.  Patient denies any symptoms of numbness in feet.  Patient denies any symptoms of tingling in feet.  Patient denies any symptoms of burning in feet.  Patient denies any symptoms of pins/needles sensations in feet.  Patient relates blood glucose was 120 mg/dl on yesterday.  Patient did not check blood glucose this morning.   PCP is Leeroy Cha, MD , and last visit was 08/24/2020.  Today, patient c/o of painful, discolored, thick toenails which interfere with daily activities.  Pain is aggravated when wearing enclosed shoe gear. Duration of condition: several months. Prior attempts at treatment include: none.   Past Medical History:  Diagnosis Date   Acute ST elevation myocardial infarction (STEMI) involving right coronary artery ([place]) 10/04/2015   x 2   Anxiety    Anxiety and depression    CAD (coronary artery disease)    a. CABG 06/2015: LIMA-LAD, SVG-RCA, SVG-OM2 b. STEMI s/p PCI to distal RCA lesion with a small Promus Premier stent. ( patent LIMA to the LAD, occluded SVG to OM, occluded SVG to RCA.)   CAD (coronary artery disease) of artery bypass graft; occluded SVG to RCA and occluded SVG to OM 10/08/2015   Empty sella (HCC)    GERD (gastroesophageal reflux disease)    Headache    from stress   Hematuria wed 06-13-2020   Hidradenitis axillaris    History of kidney stones    Hyperlipemia    Hypertension    Lupus ([place]) dx'd 2008   "they think I have this; have to get retested" (06/12/2015)    Obesity (BMI 30.0-34.9)    Pneumonia X 2   last time yrs ago   PONV (postoperative nausea and vomiting)    likes iv med or scopolamine patch works well   Psoriasis 06/15/2020   hands elbows, ears, small amount on scalp and bridge of nose   Rheumatoid aortitis    psorriatic arthritis   Rheumatoid arthritis ([place])    S/P angioplasty with stent; 10/04/15 to distal RCA lesion. with Promus premier. 10/08/2015   Scoliosis    Sleep apnea    patient states "it is mild" no cpap needed   Stroke [facility]) March 2014   left MCA branches; "they say I have them all the time", denies residual on 06/12/2015   Tobacco abuse    Type II diabetes mellitus ([place])    Urinary frequency    and urgency   Wears glasses    for reading    Patient Active Problem List   Diagnosis Date Noted   Chest pain 04/06/2020   Coronary artery disease    Gastroesophageal reflux disease without esophagitis    Renal calculus, left 01/10/2019   Chest pain in adult 03/02/2017   Hypoxia    Acute on chronic diastolic CHF (congestive heart failure) ([place])    Hx of CABG    S/P angioplasty with stent; 10/04/15 to distal RCA lesion. with Promus premier. 10/08/2015   History of ST elevation myocardial infarction (STEMI) 10/04/2015   Superficial  incisional surgical site infection, medial left lower extremity 07/10/2015   Hyperlipidemia with target LDL less than 70 06/13/2015   Chest pain with moderate risk of acute coronary syndrome    Tobacco abuse    Uncontrolled diabetes mellitus with hyperglycemia ([place]) 06/04/2012   Lupus ([place]) 06/03/2012   History of stroke 06/03/2012   Empty sella syndrome ([place]) 06/03/2012   Accelerated hypertension 06/03/2012   Rheumatoid arthritis ([place]) 06/03/2012   Hidradenitis axillaris 06/03/2012   Obesity (BMI 30.0-34.9)     Past Surgical History:  Procedure Laterality Date   ABDOMINAL HYSTERECTOMY  31 yrs ago   total has ovaies   BREAST BIOPSY Bilateral yrd ago   benign   CARDIAC CATHETERIZATION  N/A 06/13/2015   Procedure: Left Heart Cath and Coronary Angiography;  Surgeon: Peter M Martinique, MD;  Location: [facility];  Service: Cardiovascular;  Laterality: N/A;   CARDIAC CATHETERIZATION N/A 10/04/2015   Procedure: Left Heart Cath and Coronary Angiography;  Surgeon: [name], MD;  Location: [facility];  Service: Cardiovascular;  Laterality: N/A;   CARDIAC CATHETERIZATION N/A 10/04/2015   Procedure: Coronary Stent Intervention;  Surgeon: [name], MD;  Location: [facility];  Service: Cardiovascular;  Laterality: N/A;   CORONARY ARTERY BYPASS GRAFT N/A 06/19/2015   Procedure: CORONARY ARTERY BYPASS GRAFTING (CABG) TIMES FOUR USING LEFT INTERNAL MAMMARY, RIGHT SAPHENOUS LEG VEIN AND CRYO SAPHENOUS VEIN;  Surgeon: [name], MD;  Location: [facility];  Service: Open Heart Surgery;  Laterality: N/A;   CYSTOSCOPY WITH RETROGRADE PYELOGRAM, URETEROSCOPY AND STENT PLACEMENT Left 06/21/2020   Procedure: CYSTOSCOPY WITH RETROGRADE PYELOGRAM, URETEROSCOPY, BASKET STONE EXTRACTION AND  STENT PLACEMENT;  Surgeon: Robley Fries, MD;  Location: [facility];  Service: Urology;  Laterality: Left;   DILATION AND CURETTAGE OF UTERUS  yrs ago   HOLMIUM LASER APPLICATION Left 7/0/4888   Procedure: HOLMIUM LASER APPLICATION;  Surgeon: Robley Fries, MD;  Location: [facility];  Service: Urology;  Laterality: Left;   HYDRADENITIS EXCISION Right 12/20/2019   Procedure: EXCISION OF RIGHT  HIDRADENITIS AXILLARY;  Surgeon: Donnie Mesa, MD;  Location: [facility];  Service: General;  Laterality: Right;   IR URETERAL STENT LEFT NEW ACCESS W/O SEP NEPHROSTOMY CATH  01/10/2019   NEPHROLITHOTOMY Left 01/10/2019   Procedure: NEPHROLITHOTOMY PERCUTANEOUS;  Surgeon: Kathie Rhodes, MD;  Location: [facility];  Service: Urology;  Laterality: Left;   TEE WITHOUT CARDIOVERSION N/A 06/19/2015   Procedure: TRANSESOPHAGEAL ECHOCARDIOGRAM (TEE);  Surgeon: [name], MD;  Location: [facility];  Service: Open Heart Surgery;  Laterality: N/A;   TONSILLECTOMY  age 63   TUBAL LIGATION  many yrs ago    Current Outpatient Medications on File Prior to Visit  Medication Sig Dispense Refill   Accu-Chek Softclix Lancets lancets      acetaminophen (TYLENOL) 325 MG tablet 2 tablet as needed     amLODipine (NORVASC) 5 MG tablet Take 1 tablet (5 mg total) by mouth daily. 90 tablet 3   aspirin EC 81 MG EC tablet Take 1 tablet (81 mg total) by mouth daily.     atorvastatin (LIPITOR) 80 MG tablet TAKE 1 TABLET EVERY DAY  AT  6PM (Patient taking differently: Take 80 mg by mouth daily at 6 PM.) 90 tablet 6   Biotin w/ Vitamins C & E (HAIR/SKIN/NAILS PO) Take 1-2 tablets by mouth See admin instructions. Take 1 tablet in the morning & take 2 tablets in  the evening.     Blood Glucose Calibration (ACCU-CHEK AVIVA) SOLN      cetirizine (ZYRTEC) 10 MG tablet Take 10 mg by mouth daily.  2   cholecalciferol (VITAMIN D) 25 MCG (1000 UNIT) tablet Take 1,000 Units by mouth in the morning and at bedtime.     Cholecalciferol (VITAMIN D-3) 25 MCG (1000 UT) CAPS 1 capsule     DROPLET PEN NEEDLES 32G X 4 MM MISC      escitalopram (LEXAPRO) 10 MG tablet      Evolocumab with Infusor (REPATHA PUSHTRONEX SYSTEM) 420 MG/3.5ML SOCT Inject 420 mg into the skin every 30 (thirty) days. 3.6 mL 11   famotidine (PEPCID) 20 MG tablet Take 20 mg by mouth at bedtime as needed for heartburn or indigestion.     fluconazole (DIFLUCAN) 150 MG tablet      guaiFENesin (MUCINEX) 600 MG 12 hr tablet Take 600 mg by mouth 2 (two) times daily as needed (congestion.).     isosorbide mononitrate (IMDUR) 30 MG 24 hr tablet Take 1 tablet (30 mg total) by mouth daily. 90 tablet 3   liraglutide (VICTOZA) 18 MG/3ML SOPN Inject 1.8 mg into the skin at bedtime.      metFORMIN (GLUCOPHAGE) 1000 MG tablet Take 1,000 mg by mouth 2 (two) times daily.     metoprolol tartrate (LOPRESSOR) 25 MG tablet Take one and a half (1.5)  tablets (37.5 mg) by mouth twice (2) daily. (Patient taking differently: Take 37.5 mg by mouth 2 (two) times daily.) 270 tablet 1   Multiple Vitamin (MULTIVITAMIN WITH MINERALS) TABS tablet Take 1 tablet by mouth daily. Centrum Silver     nitroGLYCERIN (NITROSTAT) 0.4 MG SL tablet DISSOLVE 1 TABLET UNDER THE TONGUE EVERY 5 MINUTES AS  NEEDED FOR CHEST PAIN. MAX  OF 3 TABLETS IN 15 MINUTES. CALL 911 IF PAIN PERSISTS. (Patient taking differently: Place 0.4 mg under the tongue every 5 (five) minutes as needed for chest pain.) 100 tablet 3   ondansetron (ZOFRAN) 8 MG tablet 1 tablet as needed     Potassium Citrate 15 MEQ (1620 MG) TBCR Take 15 mg by mouth in the morning and at bedtime.     predniSONE (DELTASONE) 5 MG tablet Take 5 mg by mouth daily with breakfast. TAKE 1-2 TABLET ONCE A DAY FOR 30 DAYS     Risankizumab-rzaa (SKYRIZI) 150 MG/ML SOSY      tamsulosin (FLOMAX) 0.4 MG CAPS capsule Take 1 capsule (0.4 mg total) by mouth daily after supper. 7 capsule 0   triamcinolone (KENALOG) 0.025 % cream Apply 1 application topically 2 (two) times daily.     No current facility-administered medications on file prior to visit.     Allergies  Allergen Reactions   Apremilast Other (See Comments)    Social History   Occupational History   Occupation: Disability  Tobacco Use   Smoking status: Former    Packs/day: 0.50    Years: 36.00    Pack years: 18.00    Types: Cigarettes    Quit date: 06/12/2014    Years since quitting: 6.5   Smokeless tobacco: Never  Vaping Use   Vaping Use: Never used  Substance and Sexual Activity   Alcohol use: No    Alcohol/week: 0.0 standard drinks   Drug use: No   Sexual activity: Not Currently    Family History  Problem Relation Age of Onset   Diabetes Mother    Heart failure Mother    Breast cancer Mother  Stroke Maternal Aunt    Diabetes Other        maternal   Breast cancer Other    Alzheimer's disease Other        maternal   Depression Son      Immunization History  Administered Date(s) Administered   Influenza-Unspecified 11/15/2013, 04/04/2015   Tdap 02/08/2016    Objective: There were no vitals filed for this visit.  [name] [name] is a pleasant 59 y.o. female obese in NAD. AAO X 3.  Vascular Examination: Capillary refill time to digits immediate b/l. Palpable DP pulse(s) b/l lower extremities Palpable PT pulse(s) b/l lower extremities Pedal hair absent. Lower extremity skin temperature gradient within normal limits. No pain with calf compression b/l. No edema noted b/l lower extremities.  Dermatological Examination: Pedal skin with normal turgor, texture and tone b/l lower extremities. No open wounds b/l LE. No interdigital macerations b/l lower extremities. Toenails 1-5 b/l elongated, discolored, dystrophic, thickened, crumbly with subungual debris and tenderness to dorsal palpation.  Musculoskeletal Examination: Normal muscle strength 5/5 to all lower extremity muscle groups bilaterally. Hammertoe(s) noted to the 2-5 bilaterally. Pes planus deformity noted b/l lower extremities.  Footwear Assessment: Does the patient wear appropriate shoes? Yes. Does the patient need inserts/orthotics? Yes.  Neurological Examination: Protective sensation intact 5/5 intact bilaterally with 10g monofilament b/l. Vibratory sensation intact b/l. Proprioception intact bilaterally. Deep tendon reflexes normal b/l.  Clonus negative b/l.  Lab: Hemoglobin A1C Latest Ref Rng & Units 04/06/2020  HGBA1C 4.8 - 5.6 % 7.4(H)  Some recent data might be hidden   Assessment: 1. Pain due to onychomycosis of toenails of both feet   2. Acquired hammertoes of both feet   3. Pes planus of both feet   4. Uncontrolled type 2 diabetes mellitus with hyperglycemia ([place])   5. Encounter for diabetic foot exam ([place])    ADA Risk Categorization: Low Risk:  Patient has all of the following: Intact protective sensation No prior foot ulcer  No severe  deformity Pedal pulses present  Plan: -Examined patient. -Diabetic foot examination performed today. -Patient to continue soft, supportive shoe gear daily. -Toenails 1-5 b/l were debrided in length and girth with sterile nail nippers and dremel without iatrogenic bleeding.  -Patient to report any pedal injuries to medical professional immediately. -Patient/POA to call should there be question/concern in the interim.  Return in about 3 months (around 03/27/2021).  Marzetta Board, DPM

## 2021-01-08 DIAGNOSIS — I10 Essential (primary) hypertension: Secondary | ICD-10-CM | POA: Diagnosis not present

## 2021-01-08 DIAGNOSIS — E1121 Type 2 diabetes mellitus with diabetic nephropathy: Secondary | ICD-10-CM | POA: Diagnosis not present

## 2021-01-08 DIAGNOSIS — E1165 Type 2 diabetes mellitus with hyperglycemia: Secondary | ICD-10-CM | POA: Diagnosis not present

## 2021-01-08 DIAGNOSIS — I2510 Atherosclerotic heart disease of native coronary artery without angina pectoris: Secondary | ICD-10-CM | POA: Diagnosis not present

## 2021-01-08 DIAGNOSIS — M059 Rheumatoid arthritis with rheumatoid factor, unspecified: Secondary | ICD-10-CM | POA: Diagnosis not present

## 2021-01-08 DIAGNOSIS — F4321 Adjustment disorder with depressed mood: Secondary | ICD-10-CM | POA: Diagnosis not present

## 2021-01-08 DIAGNOSIS — E785 Hyperlipidemia, unspecified: Secondary | ICD-10-CM | POA: Diagnosis not present

## 2021-01-08 DIAGNOSIS — N132 Hydronephrosis with renal and ureteral calculous obstruction: Secondary | ICD-10-CM | POA: Diagnosis not present

## 2021-01-08 DIAGNOSIS — G4700 Insomnia, unspecified: Secondary | ICD-10-CM | POA: Diagnosis not present

## 2021-01-16 DIAGNOSIS — E119 Type 2 diabetes mellitus without complications: Secondary | ICD-10-CM | POA: Diagnosis not present

## 2021-01-28 ENCOUNTER — Other Ambulatory Visit: Payer: Self-pay | Admitting: Internal Medicine

## 2021-01-28 DIAGNOSIS — Z1231 Encounter for screening mammogram for malignant neoplasm of breast: Secondary | ICD-10-CM

## 2021-01-29 DIAGNOSIS — K219 Gastro-esophageal reflux disease without esophagitis: Secondary | ICD-10-CM | POA: Diagnosis not present

## 2021-01-29 DIAGNOSIS — E1165 Type 2 diabetes mellitus with hyperglycemia: Secondary | ICD-10-CM | POA: Diagnosis not present

## 2021-01-29 DIAGNOSIS — G4700 Insomnia, unspecified: Secondary | ICD-10-CM | POA: Diagnosis not present

## 2021-01-29 DIAGNOSIS — E785 Hyperlipidemia, unspecified: Secondary | ICD-10-CM | POA: Diagnosis not present

## 2021-01-29 DIAGNOSIS — M059 Rheumatoid arthritis with rheumatoid factor, unspecified: Secondary | ICD-10-CM | POA: Diagnosis not present

## 2021-01-29 DIAGNOSIS — I10 Essential (primary) hypertension: Secondary | ICD-10-CM | POA: Diagnosis not present

## 2021-01-29 DIAGNOSIS — E1121 Type 2 diabetes mellitus with diabetic nephropathy: Secondary | ICD-10-CM | POA: Diagnosis not present

## 2021-01-29 DIAGNOSIS — F4321 Adjustment disorder with depressed mood: Secondary | ICD-10-CM | POA: Diagnosis not present

## 2021-02-01 DIAGNOSIS — H608X3 Other otitis externa, bilateral: Secondary | ICD-10-CM | POA: Diagnosis not present

## 2021-02-18 DIAGNOSIS — E119 Type 2 diabetes mellitus without complications: Secondary | ICD-10-CM | POA: Diagnosis not present

## 2021-03-02 DIAGNOSIS — I2510 Atherosclerotic heart disease of native coronary artery without angina pectoris: Secondary | ICD-10-CM | POA: Diagnosis not present

## 2021-03-02 DIAGNOSIS — E785 Hyperlipidemia, unspecified: Secondary | ICD-10-CM | POA: Diagnosis not present

## 2021-03-02 DIAGNOSIS — I10 Essential (primary) hypertension: Secondary | ICD-10-CM | POA: Diagnosis not present

## 2021-03-02 DIAGNOSIS — E1121 Type 2 diabetes mellitus with diabetic nephropathy: Secondary | ICD-10-CM | POA: Diagnosis not present

## 2021-03-02 DIAGNOSIS — F329 Major depressive disorder, single episode, unspecified: Secondary | ICD-10-CM | POA: Diagnosis not present

## 2021-03-02 DIAGNOSIS — I2102 ST elevation (STEMI) myocardial infarction involving left anterior descending coronary artery: Secondary | ICD-10-CM | POA: Diagnosis not present

## 2021-03-02 DIAGNOSIS — F4321 Adjustment disorder with depressed mood: Secondary | ICD-10-CM | POA: Diagnosis not present

## 2021-03-02 DIAGNOSIS — I5033 Acute on chronic diastolic (congestive) heart failure: Secondary | ICD-10-CM | POA: Diagnosis not present

## 2021-03-02 DIAGNOSIS — E1165 Type 2 diabetes mellitus with hyperglycemia: Secondary | ICD-10-CM | POA: Diagnosis not present

## 2021-03-04 DIAGNOSIS — E669 Obesity, unspecified: Secondary | ICD-10-CM | POA: Diagnosis not present

## 2021-03-04 DIAGNOSIS — M329 Systemic lupus erythematosus, unspecified: Secondary | ICD-10-CM | POA: Diagnosis not present

## 2021-03-04 DIAGNOSIS — Z6830 Body mass index (BMI) 30.0-30.9, adult: Secondary | ICD-10-CM | POA: Diagnosis not present

## 2021-03-04 DIAGNOSIS — M150 Primary generalized (osteo)arthritis: Secondary | ICD-10-CM | POA: Diagnosis not present

## 2021-03-04 DIAGNOSIS — L409 Psoriasis, unspecified: Secondary | ICD-10-CM | POA: Diagnosis not present

## 2021-03-04 DIAGNOSIS — L4050 Arthropathic psoriasis, unspecified: Secondary | ICD-10-CM | POA: Diagnosis not present

## 2021-03-04 DIAGNOSIS — R760 Raised antibody titer: Secondary | ICD-10-CM | POA: Diagnosis not present

## 2021-03-04 DIAGNOSIS — B370 Candidal stomatitis: Secondary | ICD-10-CM | POA: Diagnosis not present

## 2021-03-05 ENCOUNTER — Ambulatory Visit
Admission: RE | Admit: 2021-03-05 | Discharge: 2021-03-05 | Disposition: A | Discharge status: Home / Self Care | Payer: Medicare HMO | Source: Ambulatory Visit | Attending: Internal Medicine | Admitting: Internal Medicine

## 2021-03-05 ENCOUNTER — Telehealth: Payer: Self-pay | Admitting: Licensed Clinical Social Worker

## 2021-03-05 DIAGNOSIS — Z1231 Encounter for screening mammogram for malignant neoplasm of breast: Secondary | ICD-10-CM

## 2021-03-05 NOTE — Telephone Encounter (Signed)
Mailed pt LIHEAP packet for possible assistance through county for [name] [name]. Remain available.   Westley Hummer, MSW, [place]  [phone]- work cell phone (preferred) [phone]- desk phone

## 2021-04-02 ENCOUNTER — Encounter (HOSPITAL_COMMUNITY): Payer: Self-pay | Admitting: Emergency Medicine

## 2021-04-02 ENCOUNTER — Other Ambulatory Visit: Payer: Self-pay

## 2021-04-02 ENCOUNTER — Emergency Department (HOSPITAL_COMMUNITY)
Admission: EM | Admit: 2021-04-02 | Discharge: 2021-04-03 | Disposition: A | Discharge status: Home / Self Care | Payer: Medicare HMO | Attending: Emergency Medicine | Admitting: Emergency Medicine

## 2021-04-02 ENCOUNTER — Emergency Department (HOSPITAL_COMMUNITY): Payer: Medicare HMO

## 2021-04-02 DIAGNOSIS — Z7982 Long term (current) use of aspirin: Secondary | ICD-10-CM | POA: Diagnosis not present

## 2021-04-02 DIAGNOSIS — Z79899 Other long term (current) drug therapy: Secondary | ICD-10-CM | POA: Insufficient documentation

## 2021-04-02 DIAGNOSIS — L4050 Arthropathic psoriasis, unspecified: Secondary | ICD-10-CM | POA: Insufficient documentation

## 2021-04-02 DIAGNOSIS — I700 Atherosclerosis of aorta: Secondary | ICD-10-CM | POA: Diagnosis not present

## 2021-04-02 DIAGNOSIS — E119 Type 2 diabetes mellitus without complications: Secondary | ICD-10-CM | POA: Diagnosis not present

## 2021-04-02 DIAGNOSIS — I10 Essential (primary) hypertension: Secondary | ICD-10-CM | POA: Diagnosis not present

## 2021-04-02 DIAGNOSIS — Z7984 Long term (current) use of oral hypoglycemic drugs: Secondary | ICD-10-CM | POA: Insufficient documentation

## 2021-04-02 DIAGNOSIS — R1011 Right upper quadrant pain: Secondary | ICD-10-CM | POA: Diagnosis present

## 2021-04-02 DIAGNOSIS — R000 Tachycardia, unspecified: Secondary | ICD-10-CM | POA: Diagnosis not present

## 2021-04-02 DIAGNOSIS — N83202 Unspecified ovarian cyst, left side: Secondary | ICD-10-CM | POA: Diagnosis not present

## 2021-04-02 DIAGNOSIS — N200 Calculus of kidney: Secondary | ICD-10-CM | POA: Insufficient documentation

## 2021-04-02 DIAGNOSIS — K029 Dental caries, unspecified: Secondary | ICD-10-CM | POA: Insufficient documentation

## 2021-04-02 DIAGNOSIS — J90 Pleural effusion, not elsewhere classified: Secondary | ICD-10-CM | POA: Diagnosis not present

## 2021-04-02 DIAGNOSIS — J189 Pneumonia, unspecified organism: Secondary | ICD-10-CM | POA: Diagnosis not present

## 2021-04-02 DIAGNOSIS — J9811 Atelectasis: Secondary | ICD-10-CM | POA: Insufficient documentation

## 2021-04-02 DIAGNOSIS — K5730 Diverticulosis of large intestine without perforation or abscess without bleeding: Secondary | ICD-10-CM | POA: Diagnosis not present

## 2021-04-02 DIAGNOSIS — R1031 Right lower quadrant pain: Secondary | ICD-10-CM | POA: Diagnosis not present

## 2021-04-02 LAB — CBG MONITORING, ED: Glucose-Capillary: 92 mg/dL (ref 70–99)

## 2021-04-02 LAB — CBC WITH DIFFERENTIAL/PLATELET
Abs Immature Granulocytes: 0.04 10*3/uL (ref 0.00–0.07)
Basophils Absolute: 0 10*3/uL (ref 0.0–0.1)
Basophils Relative: 0 %
Eosinophils Absolute: 0.1 10*3/uL (ref 0.0–0.5)
Eosinophils Relative: 1 %
HCT: 43.9 % (ref 36.0–46.0)
Hemoglobin: 13.7 g/dL (ref 12.0–15.0)
Immature Granulocytes: 1 %
Lymphocytes Relative: 14 %
Lymphs Abs: 0.7 10*3/uL (ref 0.7–4.0)
MCH: 28.8 pg (ref 26.0–34.0)
MCHC: 31.2 g/dL (ref 30.0–36.0)
MCV: 92.4 fL (ref 80.0–100.0)
Monocytes Absolute: 0.3 10*3/uL (ref 0.1–1.0)
Monocytes Relative: 7 %
Neutro Abs: 3.6 10*3/uL (ref 1.7–7.7)
Neutrophils Relative %: 77 %
Platelets: 268 10*3/uL (ref 150–400)
RBC: 4.75 MIL/uL (ref 3.87–5.11)
RDW: 15.5 % (ref 11.5–15.5)
WBC: 4.8 10*3/uL (ref 4.0–10.5)
nRBC: 0 % (ref 0.0–0.2)

## 2021-04-02 LAB — LIPASE, BLOOD: Lipase: 24 U/L (ref 11–51)

## 2021-04-02 LAB — COMPREHENSIVE METABOLIC PANEL
ALT: 11 U/L (ref 0–44)
AST: 41 U/L (ref 15–41)
Albumin: 2.5 g/dL — ABNORMAL LOW (ref 3.5–5.0)
Alkaline Phosphatase: 65 U/L (ref 38–126)
Anion gap: 8 (ref 5–15)
BUN: 9 mg/dL (ref 8–23)
CO2: 22 mmol/L (ref 22–32)
Calcium: 8.5 mg/dL — ABNORMAL LOW (ref 8.9–10.3)
Chloride: 106 mmol/L (ref 98–111)
Creatinine, Ser: 0.72 mg/dL (ref 0.44–1.00)
GFR, Estimated: >60 mL/min (ref >60)
Glucose, Bld: 76 mg/dL (ref 70–99)
Potassium: 4.1 mmol/L (ref 3.5–5.1)
Sodium: 136 mmol/L (ref 135–145)
Total Bilirubin: 1.3 mg/dL — ABNORMAL HIGH (ref 0.3–1.2)
Total Protein: 7.3 g/dL (ref 6.5–8.1)

## 2021-04-02 LAB — C-REACTIVE PROTEIN: CRP: 5.4 mg/dL — ABNORMAL HIGH (ref <1.0)

## 2021-04-02 LAB — SEDIMENTATION RATE: Sed Rate: 29 mm/hr — ABNORMAL HIGH (ref 0–22)

## 2021-04-02 MED ORDER — IOHEXOL 350 MG/ML SOLN
75.0000 mL | Freq: Once | INTRAVENOUS | Status: AC | PRN
  Administered 2021-04-02: 75 mL via INTRAVENOUS

## 2021-04-02 MED ORDER — METHYLPREDNISOLONE SODIUM SUCC 125 MG IJ SOLR
125.0000 mg | Freq: Once | INTRAMUSCULAR | Status: AC
  Administered 2021-04-02: 125 mg via INTRAVENOUS
  Filled 2021-04-02: qty 2

## 2021-04-02 MED ORDER — MORPHINE SULFATE (PF) 2 MG/ML IV SOLN
2.0000 mg | Freq: Once | INTRAVENOUS | Status: AC
  Administered 2021-04-02: 2 mg via INTRAVENOUS
  Filled 2021-04-02: qty 1

## 2021-04-02 MED ORDER — ONDANSETRON HCL 4 MG/2ML IJ SOLN
4.0000 mg | Freq: Once | INTRAMUSCULAR | Status: AC
  Administered 2021-04-02: 4 mg via INTRAVENOUS
  Filled 2021-04-02: qty 2

## 2021-04-02 MED ORDER — HYDROCODONE-ACETAMINOPHEN 5-325 MG PO TABS
1.0000 | ORAL_TABLET | Freq: Once | ORAL | Status: AC
  Administered 2021-04-02: 1 via ORAL
  Filled 2021-04-02: qty 1

## 2021-04-02 NOTE — ED Provider Notes (Addendum)
[facility] EMERGENCY DEPARTMENT Provider Note   CSN: [id number] Arrival date & time: 04/02/21  1623     History  Chief Complaint  Patient presents with   Arthritis    [name] [name] is a 61 y.o. female  Patient with history of psoriatic arthritis, experiencing flare of psoriasis, complains of decreased appetite, nausea without vomiting, severe abdominal pain onset today, no diarrhea or constipation with patient does report loose stool.  Patient and cousin report progressive weight loss over the last month, worsening of condition secondary to inability to find medication to adequately manage her condition.  Patient was previously on [place] but appears to have had an allergic reaction, currently pending approval for Cosentyx.  Patient currently not on any steroids due to concern for diabetes and elevated blood glucose.  Patient's blood glucose on arrival 92.  Patient denies any chest pain, shortness of breath.  Her primary concerns are pain in the mouth, throat, difficulty swallowing, joint pain, rash, as well as her new onset abdominal pain.  Arthritis Associated symptoms include abdominal pain.      Home Medications Prior to Admission medications   Medication Sig Start Date End Date Taking? Authorizing Provider  amoxicillin-clavulanate (AUGMENTIN) 875-125 MG tablet Take 1 tablet by mouth every 12 (twelve) hours. 04/03/21  Yes Clemence Lengyel H, [name]  azithromycin (ZITHROMAX) 250 MG tablet Take 1 tablet (250 mg total) by mouth daily. Take first 2 tablets together, then 1 every day until finished. 04/03/21  Yes Jafar Poffenberger H, [name]  HYDROcodone-acetaminophen (NORCO/VICODIN) 5-325 MG tablet Take 1-2 tablets by mouth every 6 (six) hours as needed. 04/03/21  Yes Dejan Angert H, [name]  ondansetron (ZOFRAN) 4 MG tablet Take 1 tablet (4 mg total) by mouth every 6 (six) hours. 04/03/21  Yes Amazing Cowman H, [name]  predniSONE (STERAPRED UNI-PAK 21 TAB) 10 MG  (21) TBPK tablet Take by mouth daily. Take 6 tabs by mouth daily  for 2 days, then 5 tabs for 2 days, then 4 tabs for 2 days, then 3 tabs for 2 days, 2 tabs for 2 days, then 1 tab by mouth daily for 2 days 04/03/21  Yes Maikayla Beggs H, [name]  Accu-Chek Softclix Lancets lancets     [provider]  acetaminophen (TYLENOL) 325 MG tablet 2 tablet as needed    [provider]  amLODipine (NORVASC) 5 MG tablet Take 1 tablet (5 mg total) by mouth daily. 04/26/20   Kroeger, Lorelee Cover., [name]  aspirin EC 81 MG EC tablet Take 1 tablet (81 mg total) by mouth daily. 06/25/15   Nani Skillern, [name]  atorvastatin (LIPITOR) 80 MG tablet TAKE 1 TABLET EVERY DAY  AT  6PM Patient taking differently: Take 80 mg by mouth daily at 6 PM. 05/29/17   Martinique, Peter M, MD  Biotin w/ Vitamins C & E (HAIR/SKIN/NAILS PO) Take 1-2 tablets by mouth See admin instructions. Take 1 tablet in the morning & take 2 tablets in the evening.    [provider]  Blood Glucose Calibration (ACCU-CHEK AVIVA) SOLN     [provider]  cetirizine (ZYRTEC) 10 MG tablet Take 10 mg by mouth daily. 02/18/17   [provider]  cholecalciferol (VITAMIN D) 25 MCG (1000 UNIT) tablet Take 1,000 Units by mouth in the morning and at bedtime.    [provider]  Cholecalciferol (VITAMIN D-3) 25 MCG (1000 UT) CAPS 1 capsule    [provider]  DROPLET PEN NEEDLES 32G X  4 MM MISC  07/13/20   [provider]  escitalopram (LEXAPRO) 10 MG tablet  11/23/20   [provider]  Evolocumab with Infusor ([place]) 420 MG/3.5ML SOCT Inject 420 mg into the skin every 30 (thirty) days. 04/26/20   Kroeger, Lorelee Cover., [name]  famotidine (PEPCID) 20 MG tablet Take 20 mg by mouth at bedtime as needed for heartburn or indigestion.    [provider]  fluconazole (DIFLUCAN) 150 MG tablet  08/06/20   [provider]  guaiFENesin (MUCINEX) 600 MG 12 hr tablet  Take 600 mg by mouth 2 (two) times daily as needed (congestion.).    [provider]  isosorbide mononitrate (IMDUR) 30 MG 24 hr tablet Take 1 tablet (30 mg total) by mouth daily. 04/26/20   Kroeger, Lorelee Cover., [name]  liraglutide (VICTOZA) 18 MG/3ML SOPN Inject 1.8 mg into the skin at bedtime.     [provider]  metFORMIN (GLUCOPHAGE) 1000 MG tablet Take 1,000 mg by mouth 2 (two) times daily. 04/06/15   [provider]  metoprolol tartrate (LOPRESSOR) 25 MG tablet Take one and a half (1.5) tablets (37.5 mg) by mouth twice (2) daily. Patient taking differently: Take 37.5 mg by mouth 2 (two) times daily. 09/15/17   Martinique, Peter M, MD  Multiple Vitamin (MULTIVITAMIN WITH MINERALS) TABS tablet Take 1 tablet by mouth daily. Centrum Silver    [provider]  nitroGLYCERIN (NITROSTAT) 0.4 MG SL tablet DISSOLVE 1 TABLET UNDER THE TONGUE EVERY 5 MINUTES AS  NEEDED FOR CHEST PAIN. MAX  OF 3 TABLETS IN 15 MINUTES. CALL 911 IF PAIN PERSISTS. Patient taking differently: Place 0.4 mg under the tongue every 5 (five) minutes as needed for chest pain. 11/15/18   Martin[place], Peter M, MD  Potassium Citrate 15 MEQ (1620 MG) TBCR Take 15 mg by mouth in the morning and at bedtime. 10/05/19   [provider]  Risankizumab-rzaa [place] Ape) 150 MG/ML SOSY  08/21/20   [provider]  tamsulosin (FLOMAX) 0.4 MG CAPS capsule Take 1 capsule (0.4 mg total) by mouth daily after supper. 06/07/20   Larene Pickett, [name]  triamcinolone (KENALOG) 0.025 % cream Apply 1 application topically 2 (two) times daily. 05/29/20   [provider]      Allergies    Apremilast    Review of Systems   Review of Systems  Constitutional:  Positive for unexpected weight change.  HENT:  Positive for dental problem and sore throat.   Gastrointestinal:  Positive for abdominal pain and nausea.  Musculoskeletal:  Positive for arthralgias and arthritis.  All other systems reviewed and are  negative.  Physical Exam Updated Vital Signs BP (!) 145/79 (BP Location: Left Arm)    Pulse 72    Temp 98.7 F (37.1 C) (Oral)    Resp 16    SpO2 98%  Physical Exam Vitals and nursing note reviewed.  Constitutional:      General: She is not in acute distress.    Appearance: Normal appearance. She is ill-appearing.     Comments: Overall ill-appearing patient with skin changes secondary to chronic psoriasis, foul-smelling breath, and a fair amount of discomfort  HENT:     Head: Normocephalic and atraumatic.     Mouth/Throat:     Comments: Some erythema posterior oropharynx, there is poor dentition throughout the mouth, severe halitosis, multiple caries noted especially posterior molars.  There is no evidence of PTA, uvula is midline.  Tonsils are 1+ bilaterally.  Posterior  pharynx appears patent. Eyes:     General:        Right eye: No discharge.        Left eye: No discharge.  Cardiovascular:     Rate and Rhythm: Normal rate and regular rhythm.     Heart sounds: No murmur heard.   No friction rub. No gallop.     Comments: Borderline tachycardia, no accessory heart sounds noted, no murmurs gallops or rubs. Pulmonary:     Effort: Pulmonary effort is normal.     Breath sounds: Normal breath sounds.     Comments: Normal breath sounds throughout, no respiratory distress Abdominal:     General: Bowel sounds are normal.     Palpations: Abdomen is soft.     Comments: Tenderness to palpation worse in right upper quadrant, right lower quadrant.  No rebound, rigidity, guarding.  No ecchymosis noted.  There is a rash on the right lower abdomen that is consistent with psoriasis.  Skin:    General: Skin is warm and dry.     Capillary Refill: Capillary refill takes less than 2 seconds.     Comments: Scattered psoriasis plaques throughout visualized skin.  No evidence of secondary infection noted.  Neurological:     Mental Status: She is alert and oriented to person, place, and time.   Psychiatric:        Mood and Affect: Mood normal.        Behavior: Behavior normal.    ED Results / Procedures / Treatments   Labs (all labs ordered are listed, but only abnormal results are displayed) Labs Reviewed  COMPREHENSIVE METABOLIC PANEL - Abnormal; Notable for the following components:      Result Value   Calcium 8.5 (*)    Albumin 2.5 (*)    Total Bilirubin 1.3 (*)    All other components within normal limits  URINALYSIS, ROUTINE W REFLEX MICROSCOPIC - Abnormal; Notable for the following components:   APPearance HAZY (*)    Hgb urine dipstick LARGE (*)    Bilirubin Urine MODERATE (*)    Ketones, ur 15 (*)    Protein, ur 30 (*)    All other components within normal limits  SEDIMENTATION RATE - Abnormal; Notable for the following components:   Sed Rate 29 (*)    All other components within normal limits  C-REACTIVE PROTEIN - Abnormal; Notable for the following components:   CRP 5.4 (*)    All other components within normal limits  URINALYSIS, MICROSCOPIC (REFLEX) - Abnormal; Notable for the following components:   Bacteria, UA FEW (*)    All other components within normal limits  URINE CULTURE  CBC WITH DIFFERENTIAL/PLATELET  LIPASE, BLOOD  CBG MONITORING, ED  CBG MONITORING, ED    EKG None  Radiology DG Chest 2 View  Result Date: 04/03/2021 CLINICAL DATA:  Follow-up right-sided pleural effusion on recent CT EXAM: CHEST - 2 VIEW COMPARISON:  CT from the previous day FINDINGS: Cardiac shadow is enlarged. Postsurgical changes are noted. Right-sided pleural effusion and basilar atelectasis is seen. Lungs are otherwise clear. No bony abnormality is noted. IMPRESSION: Small right pleural effusion and atelectasis similar to that seen on recent CT. Electronically Signed   By: Inez Catalina M.D.   On: 04/03/2021 00:30   CT ABDOMEN PELVIS W CONTRAST  Result Date: 04/02/2021 CLINICAL DATA:  Right lower quadrant pain, history of psoriatic arthritis EXAM: CT ABDOMEN AND  PELVIS WITH CONTRAST TECHNIQUE: Multidetector CT imaging of the abdomen and  pelvis was performed using the standard protocol following bolus administration of intravenous contrast. RADIATION DOSE REDUCTION: This exam was performed according to the departmental dose-optimization program which includes automated exposure control, adjustment of the mA and/or kV according to patient size and/or use of iterative reconstruction technique. CONTRAST:  14m OMNIPAQUE IOHEXOL 350 MG/ML SOLN COMPARISON:  06/07/2020 FINDINGS: Lower chest: New right-sided pleural effusion is noted. Some mild right lower lobe consolidation is noted. Heart is enlarged in size. Hepatobiliary: Fatty infiltration of the liver is noted. The gallbladder is within normal limits. Pancreas: Unremarkable. No pancreatic ductal dilatation or surrounding inflammatory changes. Spleen: Normal in size without focal abnormality. Adrenals/Urinary Tract: Adrenal glands mild dilatation of the right renal collecting system is noted secondary to a 3 mm right UPJ stone. The more distal right ureter is within normal limits. No obstructive changes are noted on the left. The bladder is partially distended. Stomach/Bowel: The appendix is within normal limits. No obstructive or inflammatory changes of the colon are noted. Mild diverticular change is seen. Small bowel and stomach appear within normal limits. Vascular/Lymphatic: Aortic atherosclerosis. No enlarged abdominal or pelvic lymph nodes. Reproductive: Uterus has been surgically removed. 2.3 cm left ovarian cyst is noted decreased in size from the prior exam at which time it measured 2.9 cm. No right adnexal mass is seen. Other: No abdominal wall hernia or abnormality. No abdominopelvic ascites. Musculoskeletal: Degenerative changes of lumbar spine are noted. IMPRESSION: 3 mm right UPJ stone with mild obstructive changes proximally. Diverticulosis without diverticulitis. Right-sided pleural effusion with lower lobe  consolidation. 2.3 cm left ovarian cyst No follow-up imaging recommended. Note: This recommendation does not apply to premenarchal patients and to those with increased risk (genetic, family history, elevated tumor markers or other high-risk factors) of ovarian cancer. Reference: JACR 2020 Feb; 17(2):248-254 Electronically Signed   By: MInez CatalinaM.D.   On: 04/02/2021 23:10    Procedures Procedures    Medications Ordered in ED Medications  morphine 2 MG/ML injection 2 mg (has no administration in time range)  sodium chloride 0.9 % bolus 1,000 mL (has no administration in time range)  cefTRIAXone (ROCEPHIN) 1 g in sodium chloride 0.9 % 100 mL IVPB (has no administration in time range)  HYDROcodone-acetaminophen (NORCO/VICODIN) 5-325 MG per tablet 1 tablet (1 tablet Oral Given 04/02/21 1826)  morphine 2 MG/ML injection 2 mg (2 mg Intravenous Given 04/02/21 2057)  ondansetron (ZOFRAN) injection 4 mg (4 mg Intravenous Given 04/02/21 2057)  methylPREDNISolone sodium succinate (SOLU-MEDROL) 125 mg/2 mL injection 125 mg (125 mg Intravenous Given 04/02/21 2058)  iohexol (OMNIPAQUE) 350 MG/ML injection 75 mL (75 mLs Intravenous Contrast Given 04/02/21 2250)    ED Course/ Medical Decision Making/ A&P Clinical Course as of 04/03/21 0050  Tue Apr 02, 2021  1961 Patient with history of psoriatic arthritis, experiencing flare of psoriasis, complains of decreased appetite, nausea without vomiting, severe abdominal pain onset today, no diarrhea or constipation with patient does report loose stool.  Patient and cousin report progressive weight loss over the last month, worsening of condition secondary to inability to find medication to adequately manage her condition.  [place] appears to [place] had an allergic reaction, currently pending approval for Cosentyx.  Patient currently not on any steroids due to concern for diabetes and elevated blood glucose.  Patient's blood glucose on arrival  92.  Patient denies any chest pain, shortness of breath.  Her primary concerns are pain in the mouth, throat, difficulty swallowing, joint  pain, rash, as well as her new onset abdominal pain. [CP]  2353 [address] here, augmentin azithro -- pending UA, CXR [CP]    Clinical Course User Index [CP] Byrd Terrero, Joesph Fillers, [name]                           Medical Decision Making Amount and/or Complexity of Data Reviewed Labs: ordered. Radiology: ordered.  Risk Prescription drug management.  I discussed this case with my attending physician who cosigned this note including patient's presenting symptoms, physical exam, and planned diagnostics and interventions. Attending physician stated agreement with plan or made changes to plan which were implemented.   Attending physician assessed patient at bedside.  Additional history obtained from patient's cousin.  Somewhat ill-appearing patient with progressive decline secondary to severe systemic psoriatic arthritis, psoriasis.  Patient with decreased appetite, nausea without vomiting, new onset abdominal pain.  Poor control of rash, pain in mouth, pain in throat.  My differential diagnosis includes appendicitis, cholecystitis, cholangitis, nephrolithiasis other acute or surgical abdominal causes, ovarian cyst, torsion less likely.  Patient with no dysuria, hematuria, or vaginal discharge symptoms.  Also considered dental infection, peritonsillar abscess, thrush, pharyngitis, epiglottitis, GERD, ulcers.  Patient with significant weight loss, weakening, rash, what appears to be a flare of her normal psoriasis. I personally obtained lab work including CBC, CMP, lipase, ESR, CRP, urinalysis significant for elevated ESR, CRP.  Unremarkable CBC, unremarkable CMP, slightly elevated T bili.  Normal CBG.  Urinalysis shows moderate bilirubin, ketones, globin consistent with dehydration, nephrolithiasis.  No evidence of secondary infection.  Also negative.  Recently  ordered reviewed radiographic imaging including CT abdomen pelvis which is notable for 3 mm stone in the right UPJ with obstructive characteristics.  2.3 cm ovarian cyst without any evidence of rupture, no recommend addition of additional imaging.  And notable on CT abdomen pelvis is right lower lobar effusion plus or minus early consolidation.  Chest x-ray of the same area reviewed atelectasis versus early infiltrate as well as small effusion.  Patient's overall clinical picture is remarkable for number of things.  First, she has a kidney stone on the right.  It is not infected stone.  We will treat with pain medication, nausea medication, and follow-up with urology.  Encourage plenty of fluids.  Second patient with presumable right lower lobar pneumonia especially in context of general malaise, cough, new atelectasis versus infiltrate and effusion.  Third patient has significant dental disease, with severe halitosis, and throat pain, no evidence of peritonsillar abscess, uvular deviation, Ludwig angina, however will cover for dental infection, encourage follow-up with dentistry.  Finally patient appears to be having a flare of her psoriasis, with rash noted throughout the body, unplanned weight loss, general malaise.  Patient is between her systemic immunomodulators, we will administer Solu-Medrol today, as well as prescribe prednisone taper.  Encourage follow-up with rheumatology at her earliest convenience.  We will administer Rocephin, fluids in the emergency department and then discharge.  Patient understands and agrees to plan, appears stable for discharge at this time, return precautions given.  final Clinical Impression(s) / ED Diagnoses Final diagnoses:  Nephrolithiasis  Community acquired pneumonia of right lower lobe of lung  Psoriatic arthritis ([place])  Dental caries    Rx / DC Orders ED Discharge Orders          Ordered    predniSONE (STERAPRED UNI-PAK 21 TAB) 10 MG (21) TBPK tablet   Daily  04/03/21 0036    amoxicillin-clavulanate (AUGMENTIN) 875-125 MG tablet  Every 12 hours        04/03/21 0036    azithromycin (ZITHROMAX) 250 MG tablet  Daily        04/03/21 0036    ondansetron (ZOFRAN) 4 MG tablet  Every 6 hours        04/03/21 0036    HYDROcodone-acetaminophen (NORCO/VICODIN) 5-325 MG tablet  Every 6 hours PRN        04/03/21 0036              Lindsi Bayliss, [place] H, [name] 04/03/21 0050    Anselmo Pickler, [name] 04/03/21 0052    Drenda Freeze, MD 04/03/21 [phone]

## 2021-04-02 NOTE — ED Triage Notes (Signed)
Pt reports continued pain from psoriatic arthritis x 1 year.  States pain is unbearable and she is waiting to have a new medication approved.

## 2021-04-02 NOTE — ED Notes (Signed)
ED Provider at bedside. 

## 2021-04-02 NOTE — ED Provider Triage Note (Signed)
Emergency Medicine Provider Triage Evaluation Note  [name] [name] , a 61 y.o. female  was evaluated in triage.  Pt complains of continued pain from psoriatic arthritis for over a year.  Patient now states that the pain is unbearable, and she is waiting to have a new medication approved. She has an appointment with her PCP scheduled for Friday.   Review of Systems  Positive: Polyarthralgias Negative: Fever, chills  Physical Exam  BP 131/86 (BP Location: Right Arm)    Pulse 100    Temp 98.7 F (37.1 C) (Oral)    Resp 18    SpO2 96%  Gen:   Awake, no distress   Resp:  Normal effort  MSK:   Moves extremities without difficulty  Other:    Medical Decision Making  Medically screening exam initiated at 6:20 PM.  Appropriate orders placed.  KACEE SUKHU was informed that the remainder of the evaluation will be completed by another provider, this initial triage assessment does not replace that evaluation, and the importance of remaining in the ED until their evaluation is complete.     [name], [name] 04/02/21 1823

## 2021-04-02 NOTE — ED Notes (Signed)
Pt assisted to bathroom with wheelchair, attempting to obtain urine sample

## 2021-04-03 ENCOUNTER — Ambulatory Visit: Payer: Medicare HMO | Admitting: Podiatry

## 2021-04-03 ENCOUNTER — Emergency Department (HOSPITAL_COMMUNITY): Payer: Medicare HMO

## 2021-04-03 DIAGNOSIS — J9811 Atelectasis: Secondary | ICD-10-CM | POA: Diagnosis not present

## 2021-04-03 DIAGNOSIS — J90 Pleural effusion, not elsewhere classified: Secondary | ICD-10-CM | POA: Diagnosis not present

## 2021-04-03 LAB — URINALYSIS, ROUTINE W REFLEX MICROSCOPIC
Glucose, UA: NEGATIVE mg/dL
Ketones, ur: 15 mg/dL — AB
Leukocytes,Ua: NEGATIVE
Nitrite: NEGATIVE
Protein, ur: 30 mg/dL — AB
Specific Gravity, Urine: 1.01 (ref 1.005–1.030)
pH: 5.5 (ref 5.0–8.0)

## 2021-04-03 LAB — URINALYSIS, MICROSCOPIC (REFLEX): RBC / HPF: >50 RBC/hpf (ref 0–5)

## 2021-04-03 MED ORDER — MORPHINE SULFATE (PF) 2 MG/ML IV SOLN
2.0000 mg | Freq: Once | INTRAVENOUS | Status: AC
  Administered 2021-04-03: 2 mg via INTRAVENOUS
  Filled 2021-04-03: qty 1

## 2021-04-03 MED ORDER — AZITHROMYCIN 250 MG PO TABS: 250.0000 mg | ORAL_TABLET | Freq: Every day | ORAL | 0 refills | Status: DC

## 2021-04-03 MED ORDER — ONDANSETRON HCL 4 MG PO TABS: 4.0000 mg | ORAL_TABLET | Freq: Four times a day (QID) | ORAL | 0 refills | Status: DC

## 2021-04-03 MED ORDER — HYDROCODONE-ACETAMINOPHEN 5-325 MG PO TABS: 1.0000 | ORAL_TABLET | Freq: Four times a day (QID) | ORAL | 0 refills | Status: DC | PRN

## 2021-04-03 MED ORDER — AMOXICILLIN-POT CLAVULANATE 875-125 MG PO TABS: 1.0000 | ORAL_TABLET | Freq: Two times a day (BID) | ORAL | 0 refills | Status: DC

## 2021-04-03 MED ORDER — SODIUM CHLORIDE 0.9 % IV SOLN
1.0000 g | Freq: Once | INTRAVENOUS | Status: AC
  Administered 2021-04-03: 1 g via INTRAVENOUS
  Filled 2021-04-03: qty 10

## 2021-04-03 MED ORDER — SODIUM CHLORIDE 0.9 % IV BOLUS
1000.0000 mL | Freq: Once | INTRAVENOUS | Status: AC
  Administered 2021-04-03: 1000 mL via INTRAVENOUS

## 2021-04-03 MED ORDER — PREDNISONE 10 MG (21) PO TBPK: ORAL_TABLET | Freq: Every day | ORAL | 0 refills | Status: DC

## 2021-04-03 NOTE — Discharge Instructions (Addendum)
As we discussed there is number of things that we found today.  You have a 3 mm kidney stone in the right ureter.  This should pass on its own, however it can be associate with significant pain, nausea.  I prescribed you some pain and nausea medication to help with this.  Additionally I recommend that you follow-up with urology earliest convenience.  Next your CT showed some consolidation of your right lower lung consistent with a possible early pneumonia, prescribed some antibiotics to cover for both possible dental infection as well as the pneumonia.  Please take the entire course of both antibiotics.  Finally given your recent weight loss, overall fatigue and feeling awful, as well as recurrence of your rash it appears that you are having a flare of your psoriasis, psoriatic arthritis.  I prescribed you a prednisone taper.  As we discussed this may raise your blood sugar over the coming days, elevated blood sugar on its own while we know that you are taking steroids is not overly alarming, and I recommend keeping a closer eye on your blood sugar, but knowing that it may be elevated over the next few days.  If it is persistently 400 or greater please speak with your endocrinologist, or PCP for further advice or evaluation.  Additionally please follow-up with dentistry at your earliest convenience.

## 2021-04-03 NOTE — ED Notes (Signed)
Patient verbalizes understanding of discharge instructions. Opportunity for questioning and answers were provided. Armband removed by staff, pt discharged from ED. Wheeled out to lobby  

## 2021-04-05 ENCOUNTER — Other Ambulatory Visit: Payer: Self-pay

## 2021-04-05 ENCOUNTER — Encounter: Payer: Self-pay | Admitting: Podiatry

## 2021-04-05 ENCOUNTER — Ambulatory Visit: Payer: Medicare HMO | Admitting: Podiatry

## 2021-04-05 DIAGNOSIS — R634 Abnormal weight loss: Secondary | ICD-10-CM | POA: Diagnosis not present

## 2021-04-05 DIAGNOSIS — E1121 Type 2 diabetes mellitus with diabetic nephropathy: Secondary | ICD-10-CM | POA: Diagnosis not present

## 2021-04-05 DIAGNOSIS — N390 Urinary tract infection, site not specified: Secondary | ICD-10-CM | POA: Diagnosis not present

## 2021-04-05 DIAGNOSIS — W19XXXS Unspecified fall, sequela: Secondary | ICD-10-CM | POA: Diagnosis not present

## 2021-04-05 DIAGNOSIS — J189 Pneumonia, unspecified organism: Secondary | ICD-10-CM | POA: Diagnosis not present

## 2021-04-05 DIAGNOSIS — M79675 Pain in left toe(s): Secondary | ICD-10-CM | POA: Diagnosis not present

## 2021-04-05 DIAGNOSIS — Z7984 Long term (current) use of oral hypoglycemic drugs: Secondary | ICD-10-CM | POA: Diagnosis not present

## 2021-04-05 DIAGNOSIS — M79674 Pain in right toe(s): Secondary | ICD-10-CM | POA: Diagnosis not present

## 2021-04-05 DIAGNOSIS — B351 Tinea unguium: Secondary | ICD-10-CM | POA: Diagnosis not present

## 2021-04-05 DIAGNOSIS — E1165 Type 2 diabetes mellitus with hyperglycemia: Secondary | ICD-10-CM | POA: Diagnosis not present

## 2021-04-05 DIAGNOSIS — F329 Major depressive disorder, single episode, unspecified: Secondary | ICD-10-CM | POA: Insufficient documentation

## 2021-04-05 DIAGNOSIS — L4050 Arthropathic psoriasis, unspecified: Secondary | ICD-10-CM | POA: Diagnosis not present

## 2021-04-05 DIAGNOSIS — T887XXA Unspecified adverse effect of drug or medicament, initial encounter: Secondary | ICD-10-CM | POA: Diagnosis not present

## 2021-04-05 LAB — URINE CULTURE: Culture: >=100000 — AB

## 2021-04-06 ENCOUNTER — Emergency Department (HOSPITAL_COMMUNITY): Payer: Medicare HMO

## 2021-04-06 ENCOUNTER — Other Ambulatory Visit: Payer: Self-pay

## 2021-04-06 ENCOUNTER — Encounter (HOSPITAL_COMMUNITY): Payer: Self-pay

## 2021-04-06 ENCOUNTER — Emergency Department (HOSPITAL_COMMUNITY)
Admission: EM | Admit: 2021-04-06 | Discharge: 2021-04-06 | Disposition: A | Discharge status: Home / Self Care | Payer: Medicare HMO | Attending: Emergency Medicine | Admitting: Emergency Medicine

## 2021-04-06 ENCOUNTER — Telehealth (HOSPITAL_BASED_OUTPATIENT_CLINIC_OR_DEPARTMENT_OTHER): Payer: Self-pay | Admitting: *Deleted

## 2021-04-06 DIAGNOSIS — R079 Chest pain, unspecified: Secondary | ICD-10-CM | POA: Insufficient documentation

## 2021-04-06 DIAGNOSIS — I10 Essential (primary) hypertension: Secondary | ICD-10-CM | POA: Insufficient documentation

## 2021-04-06 DIAGNOSIS — I517 Cardiomegaly: Secondary | ICD-10-CM | POA: Diagnosis not present

## 2021-04-06 DIAGNOSIS — R61 Generalized hyperhidrosis: Secondary | ICD-10-CM | POA: Insufficient documentation

## 2021-04-06 DIAGNOSIS — Z79899 Other long term (current) drug therapy: Secondary | ICD-10-CM | POA: Insufficient documentation

## 2021-04-06 DIAGNOSIS — R42 Dizziness and giddiness: Secondary | ICD-10-CM | POA: Diagnosis not present

## 2021-04-06 DIAGNOSIS — I2510 Atherosclerotic heart disease of native coronary artery without angina pectoris: Secondary | ICD-10-CM | POA: Insufficient documentation

## 2021-04-06 DIAGNOSIS — R0602 Shortness of breath: Secondary | ICD-10-CM | POA: Diagnosis not present

## 2021-04-06 DIAGNOSIS — H538 Other visual disturbances: Secondary | ICD-10-CM | POA: Diagnosis not present

## 2021-04-06 DIAGNOSIS — E119 Type 2 diabetes mellitus without complications: Secondary | ICD-10-CM | POA: Insufficient documentation

## 2021-04-06 DIAGNOSIS — Z87891 Personal history of nicotine dependence: Secondary | ICD-10-CM | POA: Diagnosis not present

## 2021-04-06 DIAGNOSIS — J90 Pleural effusion, not elsewhere classified: Secondary | ICD-10-CM | POA: Diagnosis not present

## 2021-04-06 DIAGNOSIS — R0789 Other chest pain: Secondary | ICD-10-CM | POA: Diagnosis not present

## 2021-04-06 LAB — CBC
HCT: 44.3 % (ref 36.0–46.0)
Hemoglobin: 14 g/dL (ref 12.0–15.0)
MCH: 29.2 pg (ref 26.0–34.0)
MCHC: 31.6 g/dL (ref 30.0–36.0)
MCV: 92.5 fL (ref 80.0–100.0)
Platelets: 254 10*3/uL (ref 150–400)
RBC: 4.79 MIL/uL (ref 3.87–5.11)
RDW: 15.7 % — ABNORMAL HIGH (ref 11.5–15.5)
WBC: 3.2 10*3/uL — ABNORMAL LOW (ref 4.0–10.5)
nRBC: 0 % (ref 0.0–0.2)

## 2021-04-06 LAB — COMPREHENSIVE METABOLIC PANEL
ALT: 15 U/L (ref 0–44)
AST: 33 U/L (ref 15–41)
Albumin: 2.7 g/dL — ABNORMAL LOW (ref 3.5–5.0)
Alkaline Phosphatase: 63 U/L (ref 38–126)
Anion gap: 9 (ref 5–15)
BUN: 12 mg/dL (ref 8–23)
CO2: 27 mmol/L (ref 22–32)
Calcium: 9.2 mg/dL (ref 8.9–10.3)
Chloride: 103 mmol/L (ref 98–111)
Creatinine, Ser: 0.74 mg/dL (ref 0.44–1.00)
GFR, Estimated: >60 mL/min (ref >60)
Glucose, Bld: 156 mg/dL — ABNORMAL HIGH (ref 70–99)
Potassium: 3.8 mmol/L (ref 3.5–5.1)
Sodium: 139 mmol/L (ref 135–145)
Total Bilirubin: 0.6 mg/dL (ref 0.3–1.2)
Total Protein: 7.6 g/dL (ref 6.5–8.1)

## 2021-04-06 LAB — TROPONIN I (HIGH SENSITIVITY)
Troponin I (High Sensitivity): 20 ng/L — ABNORMAL HIGH (ref <18)
Troponin I (High Sensitivity): 15 ng/L (ref <18)

## 2021-04-06 MED ORDER — SUCRALFATE 1 G PO TABS: 1.0000 g | ORAL_TABLET | Freq: Four times a day (QID) | ORAL | 0 refills | Status: DC | PRN

## 2021-04-06 MED ORDER — NITROGLYCERIN 0.4 MG SL SUBL
0.4000 mg | SUBLINGUAL_TABLET | Freq: Once | SUBLINGUAL | Status: AC
  Administered 2021-04-06: 0.4 mg via SUBLINGUAL
  Filled 2021-04-06: qty 1

## 2021-04-06 MED ORDER — ALUM & MAG HYDROXIDE-SIMETH 200-200-20 MG/5ML PO SUSP
30.0000 mL | Freq: Once | ORAL | Status: AC
  Administered 2021-04-06: 30 mL via ORAL
  Filled 2021-04-06: qty 30

## 2021-04-06 MED ORDER — ONDANSETRON HCL 4 MG/2ML IJ SOLN
4.0000 mg | Freq: Once | INTRAMUSCULAR | Status: AC
  Administered 2021-04-06: 4 mg via INTRAVENOUS
  Filled 2021-04-06: qty 2

## 2021-04-06 NOTE — ED Triage Notes (Signed)
BIB [facility] from home. Pt was woken up from central chest pressure. Non radiating. Pt has had some nausea and dizziness.  Hx: HTN Pt took 324 aspirin prior to arrival. EMS gave 1 nitro with some relief.   136/80 99% room air 86 heart rate CBG 160

## 2021-04-06 NOTE — Telephone Encounter (Signed)
Post ED Visit - Positive Culture Follow-up  Culture report reviewed by antimicrobial stewardship pharmacist: [facility] Team []  Elenor Quinones, Pharm.D. []  Heide Guile, Pharm.D., BCPS AQ-ID []  Parks Neptune, Pharm.D., BCPS []  Alycia Rossetti, Pharm.D., BCPS []  [place], Pharm.D., BCPS, AAHIVP []  Legrand Como, Pharm.D., BCPS, AAHIVP []  Salome Arnt, PharmD, BCPS []  Johnnette Gourd, PharmD, BCPS []  Hughes Better, PharmD, BCPS []  Leeroy Cha, PharmD []  Laqueta Linden, PharmD, BCPS [x]  Laurence Spates, PharmD  [place] Team []  Leodis Sias, PharmD []  Lindell Spar, PharmD []  Royetta Asal, PharmD []  Graylin Shiver, Rph []  Rema Fendt) Glennon Mac, PharmD []  Arlyn Dunning, PharmD []  Netta Cedars, PharmD []  Dia Sitter, PharmD []  Leone Haven, PharmD []  Gretta Arab, PharmD []  Theodis Shove, PharmD []  Peggyann Juba, PharmD []  Reuel Boom, PharmD   Positive urine culture Treated with Amoxicillin-Pot Clavulanate/ Azithromycin, organism sensitive to the same and no further patient follow-up is required at this time.  Rosie Fate 04/06/2021, 11:15 AM

## 2021-04-06 NOTE — ED Provider Notes (Signed)
[facility] Emergency Department Provider Note MRN:  [id number]  Arrival date & time: 04/06/21     Chief Complaint   Chest Pain   History of Present Illness   [name] [name] is a 61 y.o. year-old female with a history of CAD presenting to the ED with chief complaint of chest pain.  Woke from sleep with central chest pressure, currently 8 out of 10.  Improved with nitroglycerin.  Associated with some shortness of breath, diaphoresis.  Review of Systems  A thorough review of systems was obtained and all systems are negative except as noted in the HPI and PMH.   Patient's Health History    Past Medical History:  Diagnosis Date   Acute ST elevation myocardial infarction (STEMI) involving right coronary artery ([place]) 10/04/2015   x 2   Anxiety    Anxiety and depression    CAD (coronary artery disease)    a. CABG 06/2015: LIMA-LAD, SVG-RCA, SVG-OM2 b. STEMI s/p PCI to distal RCA lesion with a small Promus Premier stent. ( patent LIMA to the LAD, occluded SVG to OM, occluded SVG to RCA.)   CAD (coronary artery disease) of artery bypass graft; occluded SVG to RCA and occluded SVG to OM 10/08/2015   Empty sella (HCC)    GERD (gastroesophageal reflux disease)    Headache    from stress   Hematuria wed 06-13-2020   Hidradenitis axillaris    History of kidney stones    Hyperlipemia    Hypertension    Lupus ([place]) dx'd 2008   "they think I have this; have to get retested" (06/12/2015)   Obesity (BMI 30.0-34.9)    Pneumonia X 2   last time yrs ago   PONV (postoperative nausea and vomiting)    likes iv med or scopolamine patch works well   Psoriasis 06/15/2020   hands elbows, ears, small amount on scalp and bridge of nose   Rheumatoid aortitis    psorriatic arthritis   Rheumatoid arthritis ([place])    S/P angioplasty with stent; 10/04/15 to distal RCA lesion. with Promus premier. 10/08/2015   Scoliosis    Sleep apnea    patient states "it is mild" no cpap needed    Stroke [facility]) March 2014   left MCA branches; "they say I have them all the time", denies residual on 06/12/2015   Tobacco abuse    Type II diabetes mellitus ([place])    Urinary frequency    and urgency   Wears glasses    for reading    Past Surgical History:  Procedure Laterality Date   ABDOMINAL HYSTERECTOMY  31 yrs ago   total has ovaies   BREAST BIOPSY Bilateral yrd ago   benign   CARDIAC CATHETERIZATION N/A 06/13/2015   Procedure: Left Heart Cath and Coronary Angiography;  Surgeon: Peter M Martinique, MD;  Location: [place] [facility];  Service: Cardiovascular;  Laterality: N/A;   CARDIAC CATHETERIZATION N/A 10/04/2015   Procedure: Left Heart Cath and Coronary Angiography;  Surgeon: [name], MD;  Location: [place] [facility];  Service: Cardiovascular;  Laterality: N/A;   CARDIAC CATHETERIZATION N/A 10/04/2015   Procedure: Coronary Stent Intervention;  Surgeon: [name], MD;  Location: [place] [facility];  Service: Cardiovascular;  Laterality: N/A;   CORONARY ARTERY BYPASS GRAFT N/A 06/19/2015   Procedure: CORONARY ARTERY BYPASS GRAFTING (CABG) TIMES FOUR USING LEFT INTERNAL MAMMARY, RIGHT SAPHENOUS LEG VEIN AND CRYO SAPHENOUS VEIN;  Surgeon: [name], MD;  Location: [facility];  Service: Open Heart Surgery;  Laterality: N/A;   CYSTOSCOPY WITH RETROGRADE PYELOGRAM, URETEROSCOPY AND STENT PLACEMENT Left 06/21/2020   Procedure: CYSTOSCOPY WITH RETROGRADE PYELOGRAM, URETEROSCOPY, BASKET STONE EXTRACTION AND  STENT PLACEMENT;  Surgeon: Robley Fries, MD;  Location: [facility];  Service: Urology;  Laterality: Left;   DILATION AND CURETTAGE OF UTERUS  yrs ago   HOLMIUM LASER APPLICATION Left 11/20/2839   Procedure: HOLMIUM LASER APPLICATION;  Surgeon: Robley Fries, MD;  Location: [facility];  Service: Urology;  Laterality: Left;   HYDRADENITIS EXCISION Right 12/20/2019   Procedure: EXCISION OF RIGHT  HIDRADENITIS AXILLARY;  Surgeon: Donnie Mesa, MD;  Location: [place];  Service: General;  Laterality: Right;   IR URETERAL STENT LEFT NEW ACCESS W/O SEP NEPHROSTOMY CATH  01/10/2019   NEPHROLITHOTOMY Left 01/10/2019   Procedure: NEPHROLITHOTOMY PERCUTANEOUS;  Surgeon: Kathie Rhodes, MD;  Location: [facility];  Service: Urology;  Laterality: Left;   TEE WITHOUT CARDIOVERSION N/A 06/19/2015   Procedure: TRANSESOPHAGEAL ECHOCARDIOGRAM (TEE);  Surgeon: [name], MD;  Location: [place];  Service: Open Heart Surgery;  Laterality: N/A;   TONSILLECTOMY  age 17   TUBAL LIGATION  many yrs ago    Family History  Problem Relation Age of Onset   Diabetes Mother    Heart failure Mother    Breast cancer Mother        58s   Stroke Maternal Aunt    Depression Son    Diabetes Other        maternal   Breast cancer Other        2 diff great maternal aunts -  both 23s   Alzheimer's disease Other        maternal    Social History   Socioeconomic History   Marital status: Widowed    Spouse name: Not on file   Number of children: 2   Years of education: college   Highest education level: Some college, no degree  Occupational History   Occupation: Disability  Tobacco Use   Smoking status: Former    Packs/day: 0.50    Years: 36.00    Pack years: 18.00    Types: Cigarettes    Quit date: 06/12/2014    Years since quitting: 6.8   Smokeless tobacco: Never  Vaping Use   Vaping Use: Never used  Substance and Sexual Activity   Alcohol use: No    Alcohol/week: 0.0 standard drinks   Drug use: No   Sexual activity: Not Currently  Other Topics Concern   Not on file  Social History Narrative   Patient currently lives at home with her disabled husband who was shot in 2009 and he is total care. She lives with her son and daughter and there is an 8 that comes in. Patient currently is on disability   She occasionally drinks caffeine.    Social Determinants of Health   Financial Resource Strain: High Risk   Difficulty of Paying Living  Expenses: Very hard  Food Insecurity: Landscape architect Present   Worried About Charity fundraiser in the Last Year: Sometimes true   Arboriculturist in the Last Year: Sometimes true  Transportation Needs: Unmet Transportation Needs   Lack of Transportation (Medical): No   Lack of Transportation (Non-Medical): Yes  Physical Activity: Inactive   Days of Exercise per Week: 0 days   Minutes of Exercise per Session: 0 min  Stress: No Stress  Concern Present   Feeling of Stress : Only a little  Social Connections: Moderately Integrated   Frequency of Communication with Friends and Family: More than three times a week   Frequency of Social Gatherings with Friends and Family: More than three times a week   Attends Religious Services: 1 to 4 times per year   Active Member of [employer] or Organizations: No   Attends Archivist Meetings: 1 to 4 times per year   Marital Status: Widowed  Human resources officer Violence: Not At Risk   Fear of Current or Ex-Partner: No   Emotionally Abused: No   Physically Abused: No   Sexually Abused: No     Physical Exam   Vitals:   04/06/21 0415 04/06/21 0430  BP: (!) 162/93 (!) 165/92  Pulse: 77 69  Resp:    Temp:    SpO2: 94% 95%    CONSTITUTIONAL: Well-appearing, NAD NEURO/PSYCH:  Alert and oriented x 3, no focal deficits EYES:  eyes equal and reactive ENT/NECK:  no LAD, no JVD CARDIO: Regular rate, well-perfused, normal S1 and S2 PULM:  CTAB no wheezing or rhonchi GI/GU:  non-distended, non-tender MSK/SPINE:  No gross deformities, no edema SKIN:  no rash, atraumatic   *Additional and/or pertinent findings included in MDM below  Diagnostic and Interventional Summary    EKG Interpretation  Date/Time:  Saturday April 06 2021 01:59:58 EST Ventricular Rate:  75 PR Interval:  160 QRS Duration: 110 QT Interval:  417 QTC Calculation: 466 R Axis:   14 Text Interpretation: Sinus rhythm Probable left atrial enlargement RSR' in V1 or V2,  probably normal variant Inferior infarct, age indeterminate Confirmed by Gerlene Fee [phone]) on 04/06/2021 2:25:06 AM       Labs Reviewed  CBC - Abnormal; Notable for the following components:      Result Value   WBC 3.2 (*)    RDW 15.7 (*)    All other components within normal limits  COMPREHENSIVE METABOLIC PANEL - Abnormal; Notable for the following components:   Glucose, Bld 156 (*)    Albumin 2.7 (*)    All other components within normal limits  TROPONIN I (HIGH SENSITIVITY) - Abnormal; Notable for the following components:   Troponin I (High Sensitivity) 20 (*)    All other components within normal limits  TROPONIN I (HIGH SENSITIVITY)    DG Chest Port 1 View  Final Result      Medications  nitroGLYCERIN (NITROSTAT) SL tablet 0.4 mg (0.4 mg Sublingual Given 04/06/21 0252)  alum & mag hydroxide-simeth (MAALOX/MYLANTA) 200-200-20 MG/5ML suspension 30 mL (30 mLs Oral Given 04/06/21 0251)  ondansetron (ZOFRAN) injection 4 mg (4 mg Intravenous Given 04/06/21 0448)     Procedures  /  Critical Care Procedures  ED Course and Medical Decision Making  Initial Impression and Ddx Chest pain that woke her from sleep, extensive cardiac history, awaiting EKG, troponin x2.  Doubt PE, doubt dissection.  Patient is having a lot of issues with abdominal pain after meals, trouble swallowing as well.  Suspect GI cause.  Past medical/surgical history that increases complexity of ED encounter: CAD  Interpretation of Diagnostics I personally reviewed the EKG and my interpretation is as follows: No concerning ischemic changes    Troponin is negative x2, downtrending.  Patient Reassessment and Ultimate Disposition/Management Appropriate for discharge with close follow-up.  Patient management required discussion with the following services or consulting groups:  None  Complexity of Problems Addressed Chronic illness with exacerbation  Additional  Data Reviewed and Analyzed Further  history obtained from: Prior cath report/recent echo  Factors Impacting ED Encounter Risk Consideration of hospitalization  Barth Kirks. [place]onia Small, [place] [email or web address]  Final Clinical Impressions(s) / ED Diagnoses     ICD-10-CM   1. Chest pain, unspecified type  R07.9       ED Discharge Orders          Ordered    sucralfate (CARAFATE) 1 g tablet  4 times daily PRN        04/06/21 0658             Discharge Instructions Discussed with and Provided to Patient:     Discharge Instructions      You were evaluated in the Emergency Department and after careful evaluation, we did not find any emergent condition requiring admission or further testing in the hospital.  Your exam/testing today is overall reassuring.  Symptoms seem to be related to your stomach or swallowing issues.  Recommend follow-up with gastroenterology.  Can use the Carafate as needed for symptoms.  Please return to the Emergency Department if you experience any worsening of your condition.   Thank you for allowing [place] to be a part of your care.       Maudie Flakes, MD 04/06/21 0700

## 2021-04-06 NOTE — ED Notes (Signed)
Pt states she is nauseated. Pt unable to tolerate maaxlox medication. Pt states the smell make it unbearable to take.

## 2021-04-06 NOTE — Discharge Instructions (Signed)
You were evaluated in the Emergency Department and after careful evaluation, we did not find any emergent condition requiring admission or further testing in the hospital.  Your exam/testing today is overall reassuring.  Symptoms seem to be related to your stomach or swallowing issues.  Recommend follow-up with gastroenterology.  Can use the Carafate as needed for symptoms.  Please return to the Emergency Department if you experience any worsening of your condition.   Thank you for allowing [place] to be a part of your care.

## 2021-04-13 NOTE — Progress Notes (Signed)
°  Subjective:  Patient ID: [name] [name], female    DOB: July 13, 1960,  MRN: [id number]  [name] [name] presents to clinic today for preventative diabetic foot care and thick, elongated toenails 1-5 bilaterally which are tender when wearing enclosed shoe gear.  Patient states blood glucose was 92 mg/dl on Tuesday.  Patient did not check blood glucose today.  New problem(s): None.   PCP is Leeroy Cha, MD , and last visit was 01/08/2021.  Allergies  Allergen Reactions   Apremilast Other (See Comments)    Review of Systems: Negative except as noted in the HPI. Objective:   Constitutional [name] [name] is a pleasant 61 y.o. African American female, in NAD. AAO x 3.   Vascular CFT immediate b/l LE. Palpable DP/PT pulses b/l LE. Digital hair absent b/l. Skin temperature gradient WNL b/l. No pain with calf compression b/l. No edema noted b/l. No cyanosis or clubbing noted b/l LE.  Neurologic Normal speech. Oriented to person, place, and time. Protective sensation intact 5/5 intact bilaterally with 10g monofilament b/l.  Dermatologic Pedal integument with normal turgor, texture and tone b/l LE. No open wounds b/l. No interdigital macerations b/l. Toenails 1-5 b/l elongated, thickened, discolored with subungual debris. +Tenderness with dorsal palpation of nailplates. No hyperkeratotic or porokeratotic lesions present.  Orthopedic: Normal muscle strength 5/5 to all lower extremity muscle groups bilaterally. Hammertoe deformity noted 2-5 b/l. Pes planus deformity noted bilateral LE.Marland Kitchen No pain, crepitus or joint limitation noted with ROM b/l LE.  Patient ambulates independently without assistive aids.   Radiographs: None  Last A1c: 7.4% 04/06/2020   Assessment:   1. Pain due to onychomycosis of toenails of both feet    Plan:  Patient was evaluated and treated and all questions answered. Consent given for treatment as described below: -Continue foot and shoe inspections daily.  Monitor blood glucose per PCP/Endocrinologist's recommendations. -Mycotic toenails 1-5 bilaterally were debrided in length and girth with sterile nail nippers and dremel without incident. -Patient/POA to call should there be question/concern in the interim.  Return in about 3 months (around 07/04/2021).  Marzetta Board, DPM

## 2021-04-17 ENCOUNTER — Emergency Department (HOSPITAL_COMMUNITY): Payer: Medicare HMO

## 2021-04-17 ENCOUNTER — Encounter (HOSPITAL_COMMUNITY): Payer: Self-pay | Admitting: Emergency Medicine

## 2021-04-17 ENCOUNTER — Emergency Department (HOSPITAL_COMMUNITY)
Admission: EM | Admit: 2021-04-17 | Discharge: 2021-04-18 | Disposition: A | Discharge status: Home / Self Care | Payer: Medicare HMO | Attending: Emergency Medicine | Admitting: Emergency Medicine

## 2021-04-17 DIAGNOSIS — I2510 Atherosclerotic heart disease of native coronary artery without angina pectoris: Secondary | ICD-10-CM | POA: Insufficient documentation

## 2021-04-17 DIAGNOSIS — Z79899 Other long term (current) drug therapy: Secondary | ICD-10-CM | POA: Diagnosis not present

## 2021-04-17 DIAGNOSIS — R110 Nausea: Secondary | ICD-10-CM | POA: Diagnosis not present

## 2021-04-17 DIAGNOSIS — Z7984 Long term (current) use of oral hypoglycemic drugs: Secondary | ICD-10-CM | POA: Insufficient documentation

## 2021-04-17 DIAGNOSIS — Z7982 Long term (current) use of aspirin: Secondary | ICD-10-CM | POA: Insufficient documentation

## 2021-04-17 DIAGNOSIS — E876 Hypokalemia: Secondary | ICD-10-CM

## 2021-04-17 DIAGNOSIS — Z7952 Long term (current) use of systemic steroids: Secondary | ICD-10-CM | POA: Insufficient documentation

## 2021-04-17 DIAGNOSIS — Z955 Presence of coronary angioplasty implant and graft: Secondary | ICD-10-CM | POA: Insufficient documentation

## 2021-04-17 DIAGNOSIS — R1310 Dysphagia, unspecified: Secondary | ICD-10-CM | POA: Diagnosis not present

## 2021-04-17 DIAGNOSIS — R42 Dizziness and giddiness: Secondary | ICD-10-CM

## 2021-04-17 DIAGNOSIS — E860 Dehydration: Secondary | ICD-10-CM | POA: Diagnosis not present

## 2021-04-17 DIAGNOSIS — I10 Essential (primary) hypertension: Secondary | ICD-10-CM | POA: Diagnosis not present

## 2021-04-17 DIAGNOSIS — Z951 Presence of aortocoronary bypass graft: Secondary | ICD-10-CM | POA: Insufficient documentation

## 2021-04-17 DIAGNOSIS — E119 Type 2 diabetes mellitus without complications: Secondary | ICD-10-CM | POA: Insufficient documentation

## 2021-04-17 DIAGNOSIS — E042 Nontoxic multinodular goiter: Secondary | ICD-10-CM | POA: Diagnosis not present

## 2021-04-17 DIAGNOSIS — R1111 Vomiting without nausea: Secondary | ICD-10-CM | POA: Diagnosis not present

## 2021-04-17 DIAGNOSIS — F17200 Nicotine dependence, unspecified, uncomplicated: Secondary | ICD-10-CM | POA: Insufficient documentation

## 2021-04-17 DIAGNOSIS — R1084 Generalized abdominal pain: Secondary | ICD-10-CM | POA: Diagnosis not present

## 2021-04-17 DIAGNOSIS — R112 Nausea with vomiting, unspecified: Secondary | ICD-10-CM | POA: Diagnosis not present

## 2021-04-17 LAB — COMPREHENSIVE METABOLIC PANEL
ALT: 13 U/L (ref 0–44)
AST: 28 U/L (ref 15–41)
Albumin: 2.7 g/dL — ABNORMAL LOW (ref 3.5–5.0)
Alkaline Phosphatase: 60 U/L (ref 38–126)
Anion gap: 15 (ref 5–15)
BUN: 6 mg/dL — ABNORMAL LOW (ref 8–23)
CO2: 23 mmol/L (ref 22–32)
Calcium: 9 mg/dL (ref 8.9–10.3)
Chloride: 100 mmol/L (ref 98–111)
Creatinine, Ser: 0.81 mg/dL (ref 0.44–1.00)
GFR, Estimated: >60 mL/min (ref >60)
Glucose, Bld: 74 mg/dL (ref 70–99)
Potassium: 3.1 mmol/L — ABNORMAL LOW (ref 3.5–5.1)
Sodium: 138 mmol/L (ref 135–145)
Total Bilirubin: 1.2 mg/dL (ref 0.3–1.2)
Total Protein: 7.4 g/dL (ref 6.5–8.1)

## 2021-04-17 LAB — CBC WITH DIFFERENTIAL/PLATELET
Abs Immature Granulocytes: 0.02 10*3/uL (ref 0.00–0.07)
Basophils Absolute: 0 10*3/uL (ref 0.0–0.1)
Basophils Relative: 1 %
Eosinophils Absolute: 0 10*3/uL (ref 0.0–0.5)
Eosinophils Relative: 0 %
HCT: 47.3 % — ABNORMAL HIGH (ref 36.0–46.0)
Hemoglobin: 14.8 g/dL (ref 12.0–15.0)
Immature Granulocytes: 1 %
Lymphocytes Relative: 19 %
Lymphs Abs: 0.6 10*3/uL — ABNORMAL LOW (ref 0.7–4.0)
MCH: 29 pg (ref 26.0–34.0)
MCHC: 31.3 g/dL (ref 30.0–36.0)
MCV: 92.6 fL (ref 80.0–100.0)
Monocytes Absolute: 0.3 10*3/uL (ref 0.1–1.0)
Monocytes Relative: 12 %
Neutro Abs: 2 10*3/uL (ref 1.7–7.7)
Neutrophils Relative %: 67 %
Platelets: 209 10*3/uL (ref 150–400)
RBC: 5.11 MIL/uL (ref 3.87–5.11)
RDW: 15.9 % — ABNORMAL HIGH (ref 11.5–15.5)
WBC: 3 10*3/uL — ABNORMAL LOW (ref 4.0–10.5)
nRBC: 0 % (ref 0.0–0.2)

## 2021-04-17 LAB — URINALYSIS, ROUTINE W REFLEX MICROSCOPIC
Glucose, UA: NEGATIVE mg/dL
Hgb urine dipstick: NEGATIVE
Ketones, ur: 40 mg/dL — AB
Leukocytes,Ua: NEGATIVE
Nitrite: NEGATIVE
Protein, ur: 30 mg/dL — AB
Specific Gravity, Urine: 1.025 (ref 1.005–1.030)
pH: 6 (ref 5.0–8.0)

## 2021-04-17 LAB — URINALYSIS, MICROSCOPIC (REFLEX)

## 2021-04-17 LAB — LIPASE, BLOOD: Lipase: 41 U/L (ref 11–51)

## 2021-04-17 MED ORDER — SODIUM CHLORIDE 0.9 % IV BOLUS
1000.0000 mL | Freq: Once | INTRAVENOUS | Status: AC
  Administered 2021-04-17: 1000 mL via INTRAVENOUS

## 2021-04-17 MED ORDER — POTASSIUM CHLORIDE 10 MEQ/100ML IV SOLN
10.0000 meq | Freq: Once | INTRAVENOUS | Status: AC
  Administered 2021-04-17: 10 meq via INTRAVENOUS
  Filled 2021-04-17: qty 100

## 2021-04-17 MED ORDER — IOHEXOL 350 MG/ML SOLN
65.0000 mL | Freq: Once | INTRAVENOUS | Status: AC | PRN
  Administered 2021-04-17: 65 mL via INTRAVENOUS

## 2021-04-17 MED ORDER — POTASSIUM CHLORIDE CRYS ER 20 MEQ PO TBCR: 20.0000 meq | EXTENDED_RELEASE_TABLET | Freq: Two times a day (BID) | ORAL | 0 refills | Status: DC

## 2021-04-17 NOTE — Discharge Instructions (Addendum)
You were evaluated in the Emergency Department and after careful evaluation, we did not find any emergent condition requiring admission or further testing in the hospital.  Your exam/testing today is overall reassuring.  MRI did not show any evidence of stroke.  There was a nodule in your thyroid.  Follow-up with your primary care doctor for your thyroid lab work has been done but not resulted yet.  Try and keep yourself hydrated.  Please return to the Emergency Department if you experience any worsening of your condition.   Thank you for allowing [place] to be a part of your care.

## 2021-04-17 NOTE — ED Notes (Signed)
NT attempted to assist pt in sitting up in bed and pt almost passed out x2. RN notified.

## 2021-04-17 NOTE — ED Notes (Signed)
Pt c/o chronic generalized pain d/t SA and nausea x "for a while."  Pt reporting swallowing issues as well.  Pt unsure which came first, the swallowing issues or the n/v.

## 2021-04-17 NOTE — ED Provider Triage Note (Signed)
Emergency Medicine Provider Triage Evaluation Note  [name] [name] , a 61 y.o. female  was evaluated in triage.  Patient states history of psoriasis.  Pt complains of nausea and vomiting, dizziness.  This is a chronic problem for the patient.  She states that she has been unable to eat or drink much and is losing weight.  She states that when her psoriasis flares, her symptoms get worse.  Review of Systems  Positive: Generalized abdominal pain Negative: Fever  Physical Exam  There were no vitals taken for this visit. Gen:   Awake, no distress   Resp:  Normal effort  MSK:   Moves extremities without difficulty  Other:  Abdomen soft without rebound or guarding  Medical Decision Making  Medically screening exam initiated at 2:22 PM.  Appropriate orders placed.  [name] [name] was informed that the remainder of the evaluation will be completed by another provider, this initial triage assessment does not replace that evaluation, and the importance of remaining in the ED until their evaluation is complete.     Carlisle Cater, [name] 04/17/21 1423

## 2021-04-17 NOTE — ED Provider Notes (Signed)
[facility] EMERGENCY DEPARTMENT Provider Note   CSN: [id number] Arrival date & time: 04/17/21  1320     History  Chief Complaint  Patient presents with   Nausea    [name] [name] is a 61 y.o. female.  HPI Patient states she has been having issues with difficulty for a while.  Generalized aches all over.  Has been for a year.  Also swallowing issues.  States she is down around 30 pounds.  States when she eats or drinks it will feel esophagus caught in her throat.  Some nausea at times.  States she was treated for thrush previously.  States she did not have thrush.  States her mouth will also get dry.  Has a history of psoriasis also.  States when that flares up her throat is worse.  States her lips will peel.  Past Medical History:  Diagnosis Date   Acute ST elevation myocardial infarction (STEMI) involving right coronary artery ([place]) 10/04/2015   x 2   Anxiety    Anxiety and depression    CAD (coronary artery disease)    a. CABG 06/2015: LIMA-LAD, SVG-RCA, SVG-OM2 b. STEMI s/p PCI to distal RCA lesion with a small Promus Premier stent. ( patent LIMA to the LAD, occluded SVG to OM, occluded SVG to RCA.)   CAD (coronary artery disease) of artery bypass graft; occluded SVG to RCA and occluded SVG to OM 10/08/2015   Empty sella (HCC)    GERD (gastroesophageal reflux disease)    Headache    from stress   Hematuria wed 06-13-2020   Hidradenitis axillaris    History of kidney stones    Hyperlipemia    Hypertension    Lupus ([place]) dx'd 2008   "they think I have this; have to get retested" (06/12/2015)   Obesity (BMI 30.0-34.9)    Pneumonia X 2   last time yrs ago   PONV (postoperative nausea and vomiting)    likes iv med or scopolamine patch works well   Psoriasis 06/15/2020   hands elbows, ears, small amount on scalp and bridge of nose   Rheumatoid aortitis    psorriatic arthritis   Rheumatoid arthritis ([place])    S/P angioplasty with stent; 10/04/15 to distal  RCA lesion. with Promus premier. 10/08/2015   Scoliosis    Sleep apnea    patient states "it is mild" no cpap needed   Stroke [facility]) March 2014   left MCA branches; "they say I have them all the time", denies residual on 06/12/2015   Tobacco abuse    Type II diabetes mellitus ([place])    Urinary frequency    and urgency   Wears glasses    for reading       Home Medications Prior to Admission medications   Medication Sig Start Date End Date Taking? Authorizing Provider  ACCU-CHEK GUIDE test strip  03/20/21  Yes [provider]  Accu-Chek Softclix Lancets lancets    Yes [provider]  acetaminophen (TYLENOL) 325 MG tablet Take 650 mg by mouth every 6 (six) hours as needed for moderate pain or headache.   Yes [provider]  amLODipine (NORVASC) 5 MG tablet Take 1 tablet (5 mg total) by mouth daily. 04/26/20  Yes Kroeger, Lorelee Cover., [name]  aspirin EC 81 MG EC tablet Take 1 tablet (81 mg total) by mouth daily. 06/25/15  Yes Lars Pinks M, [name]  atorvastatin (LIPITOR) 80 MG tablet TAKE 1 TABLET EVERY DAY  AT  6PM Patient taking differently: Take 80 mg by mouth every evening. 05/29/17  Yes Martinique, Peter M, MD  Biotin w/ Vitamins C & E (HAIR/SKIN/NAILS PO) Take 1-2 tablets by mouth See admin instructions. Take 1 tablet in the morning & take 2 tablets in the evening.   Yes [provider]  Blood Glucose Calibration (ACCU-CHEK AVIVA) SOLN    Yes [provider]  cetirizine (ZYRTEC) 10 MG tablet Take 10 mg by mouth daily. 02/18/17  Yes [provider]  cholecalciferol (VITAMIN D) 25 MCG (1000 UNIT) tablet Take 1,000 Units by mouth in the morning and at bedtime.   Yes [provider]  DROPLET PEN NEEDLES 32G X 4 MM MISC  07/13/20  Yes [provider]  escitalopram (LEXAPRO) 10 MG tablet Take 10 mg by mouth daily. 11/23/20  Yes [provider]  Evolocumab with Infusor ([place]) 420 MG/3.5ML SOCT Inject  420 mg into the skin every 30 (thirty) days. 04/26/20  Yes Kroeger, Daleen Snook M., [name]  famotidine (PEPCID) 20 MG tablet Take 20 mg by mouth at bedtime as needed for heartburn or indigestion.   Yes [provider]  guaiFENesin (MUCINEX) 600 MG 12 hr tablet Take 600 mg by mouth 2 (two) times daily as needed (congestion.).   Yes [provider]  HYDROcodone-acetaminophen (NORCO/VICODIN) 5-325 MG tablet Take 1-2 tablets by mouth every 6 (six) hours as needed. Patient taking differently: Take 1-2 tablets by mouth every 6 (six) hours as needed for moderate pain. 04/03/21  Yes Prosperi, Christian H, [name]  isosorbide mononitrate (IMDUR) 30 MG 24 hr tablet Take 1 tablet (30 mg total) by mouth daily. 04/26/20  Yes Kroeger, Daleen Snook M., [name]  liraglutide (VICTOZA) 18 MG/3ML SOPN Inject 1.8 mg into the skin at bedtime.    Yes [provider]  metFORMIN (GLUCOPHAGE) 1000 MG tablet Take 1,000 mg by mouth 2 (two) times daily. 04/06/15  Yes [provider]  metoprolol tartrate (LOPRESSOR) 25 MG tablet Take one and a half (1.5) tablets (37.5 mg) by mouth twice (2) daily. Patient taking differently: Take 37.5 mg by mouth 2 (two) times daily. 09/15/17  Yes [place], Peter M, MD  Multiple Vitamin (MULTIVITAMIN WITH MINERALS) TABS tablet Take 1 tablet by mouth daily. Centrum Silver   Yes [provider]  nitroGLYCERIN (NITROSTAT) 0.4 MG SL tablet DISSOLVE 1 TABLET UNDER THE TONGUE EVERY 5 MINUTES AS  NEEDED FOR CHEST PAIN. MAX  OF 3 TABLETS IN 15 MINUTES. CALL 911 IF PAIN PERSISTS. Patient taking differently: Place 0.4 mg under the tongue every 5 (five) minutes as needed for chest pain. 11/15/18  Yes [place], Peter M, MD  ondansetron (ZOFRAN) 4 MG tablet Take 1 tablet (4 mg total) by mouth every 6 (six) hours. Patient taking differently: Take 4 mg by mouth every 8 (eight) hours as needed for vomiting or nausea. 04/03/21  Yes Prosperi, Christian H, [name]  potassium chloride SA (KLOR-CON M) 20  MEQ tablet Take 1 tablet (20 mEq total) by mouth 2 (two) times daily. 04/17/21  Yes Davonna Belling, MD  Potassium Citrate 15 MEQ (1620 MG) TBCR Take 15 mg by mouth in the morning and at bedtime. 10/05/19  Yes [provider]  sucralfate (CARAFATE) 1 g tablet Take 1 tablet (1 g total) by mouth 4 (four) times daily as needed. Patient taking differently: Take 1 g by mouth 4 (four) times daily as needed (stomach pain). 04/06/21  Yes Maudie Flakes, MD  amoxicillin-clavulanate (AUGMENTIN) 875-125 MG tablet  Take 1 tablet by mouth every 12 (twelve) hours. Patient not taking: Reported on 04/17/2021 04/03/21   Prosperi, Christian H, [name]  azithromycin (ZITHROMAX) 250 MG tablet Take 1 tablet (250 mg total) by mouth daily. Take first 2 tablets together, then 1 every day until finished. Patient not taking: Reported on 04/17/2021 04/03/21   Prosperi, Christian H, [name]  COSENTYX SENSOREADY, 300 MG, 150 MG/ML SOAJ Inject into the skin. 03/14/21   [provider]  predniSONE (STERAPRED UNI-PAK 21 TAB) 10 MG (21) TBPK tablet Take by mouth daily. Take 6 tabs by mouth daily  for 2 days, then 5 tabs for 2 days, then 4 tabs for 2 days, then 3 tabs for 2 days, 2 tabs for 2 days, then 1 tab by mouth daily for 2 days Patient not taking: Reported on 04/17/2021 04/03/21   Prosperi, Christian H, [name]  tamsulosin (FLOMAX) 0.4 MG CAPS capsule Take 1 capsule (0.4 mg total) by mouth daily after supper. Patient not taking: Reported on 04/06/2021 06/07/20   Larene Pickett, [name]      Allergies    Apremilast    Review of Systems   Review of Systems  Constitutional:  Positive for appetite change and unexpected weight change.  HENT:  Positive for trouble swallowing. Negative for voice change.   Respiratory:  Negative for shortness of breath.   Cardiovascular:  Negative for chest pain.  Gastrointestinal:  Negative for abdominal distention and abdominal pain.  Musculoskeletal:  Positive for myalgias. Negative for back  pain.  Skin:  Positive for rash.  Neurological:  Negative for weakness.   Physical Exam Updated Vital Signs BP (!) 155/68    Pulse (!) 56    Temp 97.8 F (36.6 C) (Oral)    Resp 17    SpO2 98%  Physical Exam  ED Results / Procedures / Treatments   Labs (all labs ordered are listed, but only abnormal results are displayed) Labs Reviewed  CBC WITH DIFFERENTIAL/PLATELET - Abnormal; Notable for the following components:      Result Value   WBC 3.0 (*)    HCT 47.3 (*)    RDW 15.9 (*)    Lymphs Abs 0.6 (*)    All other components within normal limits  COMPREHENSIVE METABOLIC PANEL - Abnormal; Notable for the following components:   Potassium 3.1 (*)    BUN 6 (*)    Albumin 2.7 (*)    All other components within normal limits  URINALYSIS, ROUTINE W REFLEX MICROSCOPIC - Abnormal; Notable for the following components:   Bilirubin Urine MODERATE (*)    Ketones, ur 40 (*)    Protein, ur 30 (*)    All other components within normal limits  URINALYSIS, MICROSCOPIC (REFLEX) - Abnormal; Notable for the following components:   Bacteria, UA RARE (*)    Non Squamous Epithelial PRESENT (*)    All other components within normal limits  LIPASE, BLOOD  TSH  T4, FREE  T3, FREE    EKG None  Radiology CT Soft Tissue Neck W Contrast  Result Date: 04/17/2021 CLINICAL DATA:  Difficulty swallowing EXAM: CT NECK WITH CONTRAST TECHNIQUE: Multidetector CT imaging of the neck was performed using the standard protocol following the bolus administration of intravenous contrast. RADIATION DOSE REDUCTION: This exam was performed according to the departmental dose-optimization program which includes automated exposure control, adjustment of the mA and/or kV according to patient size and/or use of iterative reconstruction technique. CONTRAST:  5mL OMNIPAQUE IOHEXOL 350 MG/ML SOLN COMPARISON:  None. FINDINGS: Pharynx and larynx: Normal. No mass or swelling. Salivary glands: No inflammation, mass, or stone.  Thyroid: Hypoattenuating nodules in the enlarged thyroid, the largest of which measures up to 1.7 cm. Lymph nodes: Normal. Vascular: Negative. Limited intracranial: Negative. Visualized orbits: Negative. Mastoids and visualized paranasal sinuses: Clear. Skeleton: No acute osseous abnormality. Upper chest: Small right pleural effusion.  Paraseptal emphysema. Other: None IMPRESSION: 1. No acute finding in the neck. No etiology seen for the patient's difficulty swallowing. 2. Hypoattenuating nodules in an enlarged thyroid, the largest of which measures up to 1.7 cm. If this has not previously been evaluated, a non-emergent ultrasound of the thyroid is recommended. (Reference: J Am Coll Radiol. 2015 Feb;12(2): 143-50) 3. Small right pleural effusion. Electronically Signed   By: Merilyn Baba M.D.   On: 04/17/2021 22:49    Procedures Procedures    Medications Ordered in ED Medications  potassium chloride 10 mEq in 100 mL IVPB (10 mEq Intravenous New Bag/Given 04/17/21 2218)  sodium chloride 0.9 % bolus 1,000 mL (1,000 mLs Intravenous New Bag/Given 04/17/21 2217)  iohexol (OMNIPAQUE) 350 MG/ML injection 65 mL (65 mLs Intravenous Contrast Given 04/17/21 2138)    ED Course/ Medical Decision Making/ A&P                           Medical Decision Making Amount and/or Complexity of Data Reviewed Labs: ordered. Radiology: ordered.  Risk Prescription drug management.  Patient with difficulty swallowing.  Weight loss.  Differential diagnosis is long and includes pathology such as throat cancer, speech difficulty, muscle difficulty.  She has been having difficulty swallowing and muscle aches for a while now.  Has seen rheumatology.  Reportedly had similar issues a year ago that she said was treated for thrush but did not have thrush.  Has weight loss.  States she feels that food gets stuck in her throat.  Imaging done and did not show obstructing lesion.  Did have a thyroid cyst.  Had not known about this.  We  will get thyroid function and follow-up as an outpatient.  Mild hypokalemia, supplement as outpatient.  First dose given IV.  Feels better with some IV fluids.  Does have follow-up in 6 days.  Appears dehydrated but does not appear to need admission to the hospital.        Final Clinical Impression(s) / ED Diagnoses Final diagnoses:  Nausea  Hypokalemia  Dehydration    Rx / DC Orders ED Discharge Orders          Ordered    potassium chloride SA (KLOR-CON M) 20 MEQ tablet  2 times daily        04/17/21 2330              Davonna Belling, MD 04/17/21 2358

## 2021-04-17 NOTE — ED Triage Notes (Signed)
Patient BIB [facility] from home for evaluation of intermittent nausea that started a few months ago, seen multiple times over same time span for same. Patient states nausea is worse today than previous episodes. Patient alert, oriented, and in on apparent distress at this time.  164/88 HR 60 100% on room air Oral temp 97.10f

## 2021-04-17 NOTE — ED Notes (Signed)
Unsuccessful IV attempt.  Will consult IV team.

## 2021-04-18 ENCOUNTER — Emergency Department (HOSPITAL_COMMUNITY): Payer: Medicare HMO

## 2021-04-18 DIAGNOSIS — R42 Dizziness and giddiness: Secondary | ICD-10-CM | POA: Diagnosis not present

## 2021-04-18 LAB — T4, FREE: Free T4: 1 ng/dL (ref 0.61–1.12)

## 2021-04-18 LAB — TSH: TSH: 1.042 u[IU]/mL (ref 0.350–4.500)

## 2021-04-18 MED ORDER — MECLIZINE HCL 25 MG PO TABS
50.0000 mg | ORAL_TABLET | Freq: Once | ORAL | Status: AC
  Administered 2021-04-18: 50 mg via ORAL
  Filled 2021-04-18: qty 2

## 2021-04-18 MED ORDER — LORAZEPAM 2 MG/ML IJ SOLN
1.0000 mg | Freq: Once | INTRAMUSCULAR | Status: AC
  Administered 2021-04-18: 1 mg via INTRAVENOUS
  Filled 2021-04-18: qty 1

## 2021-04-18 MED ORDER — POTASSIUM CHLORIDE CRYS ER 20 MEQ PO TBCR: 20.0000 meq | EXTENDED_RELEASE_TABLET | Freq: Two times a day (BID) | ORAL | 0 refills | Status: DC

## 2021-04-18 NOTE — ED Notes (Signed)
Pt walked with walker on her own with minimal assistance. EDP notified

## 2021-04-18 NOTE — ED Notes (Signed)
Pt transported to MRI 

## 2021-04-18 NOTE — ED Provider Notes (Signed)
°  Provider Note MRN:  [id number]  Arrival date & time: 04/18/21    ED Course and Medical Decision Making  Assumed care from Dr. Alvino Chapel at shift change.  Persistent vertigo, swallowing issues.  On my reassessment still not able to stand.  Will obtain MRI, provide meclizine and reassess.  Patient feeling better after meclizine, MRI is without acute process.  Ambulating without issue.  Appropriate for discharge. Procedures  Final Clinical Impressions(s) / ED Diagnoses     ICD-10-CM   1. Nausea  R11.0     2. Hypokalemia  E87.6     3. Dehydration  E86.0     4. [place]       ED Discharge Orders          Ordered    potassium chloride SA (KLOR-CON M) 20 MEQ tablet  2 times daily        04/17/21 2330              Discharge Instructions      You were evaluated in the Emergency Department and after careful evaluation, we did not find any emergent condition requiring admission or further testing in the hospital.  Your exam/testing today is overall reassuring.  MRI did not show any evidence of stroke.  There was a nodule in your thyroid.  Follow-up with your primary care doctor for your thyroid lab work has been done but not resulted yet.  Try and keep yourself hydrated.  Please return to the Emergency Department if you experience any worsening of your condition.   Thank you for allowing [place] to be a part of your care.      Barth Kirks. [place] Small, [place] [email or web address]    Maudie Flakes, MD 04/18/21 [phone]

## 2021-04-18 NOTE — ED Notes (Signed)
Pt unable to tolerate sitting up and complaining of nausea EDP notified

## 2021-04-19 LAB — T3, FREE: T3, Free: 2.4 pg/mL (ref 2.0–4.4)

## 2021-04-23 ENCOUNTER — Other Ambulatory Visit: Payer: Self-pay | Admitting: Internal Medicine

## 2021-04-23 ENCOUNTER — Other Ambulatory Visit: Payer: Self-pay

## 2021-04-23 ENCOUNTER — Ambulatory Visit
Admission: RE | Admit: 2021-04-23 | Discharge: 2021-04-23 | Disposition: A | Discharge status: Home / Self Care | Payer: Medicare HMO | Source: Ambulatory Visit | Attending: Internal Medicine | Admitting: Internal Medicine

## 2021-04-23 DIAGNOSIS — J90 Pleural effusion, not elsewhere classified: Secondary | ICD-10-CM | POA: Diagnosis not present

## 2021-04-23 DIAGNOSIS — R634 Abnormal weight loss: Secondary | ICD-10-CM | POA: Diagnosis not present

## 2021-04-23 DIAGNOSIS — L4050 Arthropathic psoriasis, unspecified: Secondary | ICD-10-CM | POA: Diagnosis not present

## 2021-04-23 DIAGNOSIS — N390 Urinary tract infection, site not specified: Secondary | ICD-10-CM | POA: Diagnosis not present

## 2021-04-23 DIAGNOSIS — Z7984 Long term (current) use of oral hypoglycemic drugs: Secondary | ICD-10-CM | POA: Diagnosis not present

## 2021-04-23 DIAGNOSIS — E1121 Type 2 diabetes mellitus with diabetic nephropathy: Secondary | ICD-10-CM | POA: Diagnosis not present

## 2021-04-23 DIAGNOSIS — R1319 Other dysphagia: Secondary | ICD-10-CM | POA: Diagnosis not present

## 2021-04-23 DIAGNOSIS — R112 Nausea with vomiting, unspecified: Secondary | ICD-10-CM | POA: Diagnosis not present

## 2021-04-25 ENCOUNTER — Emergency Department (HOSPITAL_COMMUNITY): Payer: Medicare HMO

## 2021-04-25 ENCOUNTER — Other Ambulatory Visit: Payer: Self-pay

## 2021-04-25 ENCOUNTER — Emergency Department (HOSPITAL_COMMUNITY)
Admission: EM | Admit: 2021-04-25 | Discharge: 2021-04-25 | Disposition: A | Discharge status: Home / Self Care | Payer: Medicare HMO | Attending: Emergency Medicine | Admitting: Emergency Medicine

## 2021-04-25 ENCOUNTER — Inpatient Hospital Stay: Admission: RE | Admit: 2021-04-25 | Payer: Medicare HMO | Source: Ambulatory Visit

## 2021-04-25 ENCOUNTER — Other Ambulatory Visit: Payer: Self-pay | Admitting: Gastroenterology

## 2021-04-25 ENCOUNTER — Encounter (HOSPITAL_COMMUNITY): Payer: Self-pay | Admitting: *Deleted

## 2021-04-25 DIAGNOSIS — W228XXA Striking against or struck by other objects, initial encounter: Secondary | ICD-10-CM | POA: Diagnosis not present

## 2021-04-25 DIAGNOSIS — R5381 Other malaise: Secondary | ICD-10-CM | POA: Diagnosis not present

## 2021-04-25 DIAGNOSIS — Z7984 Long term (current) use of oral hypoglycemic drugs: Secondary | ICD-10-CM | POA: Insufficient documentation

## 2021-04-25 DIAGNOSIS — M47812 Spondylosis without myelopathy or radiculopathy, cervical region: Secondary | ICD-10-CM | POA: Diagnosis not present

## 2021-04-25 DIAGNOSIS — R42 Dizziness and giddiness: Secondary | ICD-10-CM | POA: Insufficient documentation

## 2021-04-25 DIAGNOSIS — R519 Headache, unspecified: Secondary | ICD-10-CM | POA: Diagnosis not present

## 2021-04-25 DIAGNOSIS — Z7952 Long term (current) use of systemic steroids: Secondary | ICD-10-CM | POA: Diagnosis not present

## 2021-04-25 DIAGNOSIS — W19XXXA Unspecified fall, initial encounter: Secondary | ICD-10-CM

## 2021-04-25 DIAGNOSIS — J90 Pleural effusion, not elsewhere classified: Secondary | ICD-10-CM | POA: Diagnosis not present

## 2021-04-25 DIAGNOSIS — Z7982 Long term (current) use of aspirin: Secondary | ICD-10-CM | POA: Insufficient documentation

## 2021-04-25 DIAGNOSIS — W1839XA Other fall on same level, initial encounter: Secondary | ICD-10-CM | POA: Diagnosis not present

## 2021-04-25 DIAGNOSIS — Z79899 Other long term (current) drug therapy: Secondary | ICD-10-CM | POA: Insufficient documentation

## 2021-04-25 DIAGNOSIS — E010 Iodine-deficiency related diffuse (endemic) goiter: Secondary | ICD-10-CM | POA: Diagnosis not present

## 2021-04-25 DIAGNOSIS — R110 Nausea: Secondary | ICD-10-CM | POA: Diagnosis not present

## 2021-04-25 DIAGNOSIS — R1319 Other dysphagia: Secondary | ICD-10-CM

## 2021-04-25 DIAGNOSIS — R634 Abnormal weight loss: Secondary | ICD-10-CM | POA: Diagnosis not present

## 2021-04-25 DIAGNOSIS — R1310 Dysphagia, unspecified: Secondary | ICD-10-CM

## 2021-04-25 DIAGNOSIS — S199XXA Unspecified injury of neck, initial encounter: Secondary | ICD-10-CM | POA: Diagnosis not present

## 2021-04-25 DIAGNOSIS — S0990XA Unspecified injury of head, initial encounter: Secondary | ICD-10-CM | POA: Diagnosis not present

## 2021-04-25 LAB — CBC WITH DIFFERENTIAL/PLATELET
Abs Immature Granulocytes: 0.05 10*3/uL (ref 0.00–0.07)
Basophils Absolute: 0 10*3/uL (ref 0.0–0.1)
Basophils Relative: 1 %
Eosinophils Absolute: 0 10*3/uL (ref 0.0–0.5)
Eosinophils Relative: 0 %
HCT: 45 % (ref 36.0–46.0)
Hemoglobin: 14 g/dL (ref 12.0–15.0)
Immature Granulocytes: 2 %
Lymphocytes Relative: 14 %
Lymphs Abs: 0.5 10*3/uL — ABNORMAL LOW (ref 0.7–4.0)
MCH: 28.8 pg (ref 26.0–34.0)
MCHC: 31.1 g/dL (ref 30.0–36.0)
MCV: 92.6 fL (ref 80.0–100.0)
Monocytes Absolute: 0.4 10*3/uL (ref 0.1–1.0)
Monocytes Relative: 13 %
Neutro Abs: 2.3 10*3/uL (ref 1.7–7.7)
Neutrophils Relative %: 70 %
Platelets: 186 10*3/uL (ref 150–400)
RBC: 4.86 MIL/uL (ref 3.87–5.11)
RDW: 16.5 % — ABNORMAL HIGH (ref 11.5–15.5)
WBC: 3.3 10*3/uL — ABNORMAL LOW (ref 4.0–10.5)
nRBC: 0 % (ref 0.0–0.2)

## 2021-04-25 LAB — BASIC METABOLIC PANEL
Anion gap: 16 — ABNORMAL HIGH (ref 5–15)
BUN: 6 mg/dL — ABNORMAL LOW (ref 8–23)
CO2: 23 mmol/L (ref 22–32)
Calcium: 9.4 mg/dL (ref 8.9–10.3)
Chloride: 104 mmol/L (ref 98–111)
Creatinine, Ser: 0.91 mg/dL (ref 0.44–1.00)
GFR, Estimated: >60 mL/min (ref >60)
Glucose, Bld: 93 mg/dL (ref 70–99)
Potassium: 3.5 mmol/L (ref 3.5–5.1)
Sodium: 143 mmol/L (ref 135–145)

## 2021-04-25 MED ORDER — ONDANSETRON 4 MG PO TBDP
4.0000 mg | ORAL_TABLET | Freq: Once | ORAL | Status: AC
  Administered 2021-04-25: 4 mg via ORAL
  Filled 2021-04-25: qty 1

## 2021-04-25 MED ORDER — ONDANSETRON HCL 4 MG/2ML IJ SOLN
4.0000 mg | Freq: Once | INTRAMUSCULAR | Status: DC
Start: 2021-04-25 — End: 2021-04-25

## 2021-04-25 NOTE — ED Provider Notes (Signed)
[facility] EMERGENCY DEPARTMENT Provider Note   CSN: [id number] Arrival date & time: 04/25/21  1047     History  Chief Complaint  Patient presents with   [name] [name]    [name] [name] is a 61 y.o. female.  HPI Patient presents after a fall.  Patient has multiple medical issues, including ongoing evaluation for dizziness, lightheadedness.  Today she was scheduled for GI evaluation, barium swallow.  While there, she had an episode of unsteadiness, fell backward striking her head.  No loss of consciousness, no skin wound.  Since that time she has had some dizziness, which is again mostly chronic, but no new weakness in extremity.    Home Medications Prior to Admission medications   Medication Sig Start Date End Date Taking? Authorizing Provider  ACCU-CHEK GUIDE test strip  03/20/21   [provider]  Accu-Chek Softclix Lancets lancets     [provider]  acetaminophen (TYLENOL) 325 MG tablet Take 650 mg by mouth every 6 (six) hours as needed for moderate pain or headache.    [provider]  amLODipine (NORVASC) 5 MG tablet Take 1 tablet (5 mg total) by mouth daily. 04/26/20   Kroeger, Lorelee Cover., [name]  amoxicillin-clavulanate (AUGMENTIN) 875-125 MG tablet Take 1 tablet by mouth every 12 (twelve) hours. Patient not taking: Reported on 04/17/2021 04/03/21   Prosperi, Christian H, [name]  aspirin EC 81 MG EC tablet Take 1 tablet (81 mg total) by mouth daily. 06/25/15   Nani Skillern, [name]  atorvastatin (LIPITOR) 80 MG tablet TAKE 1 TABLET EVERY DAY  AT  6PM Patient taking differently: Take 80 mg by mouth every evening. 05/29/17   Martinique, Peter M, MD  azithromycin (ZITHROMAX) 250 MG tablet Take 1 tablet (250 mg total) by mouth daily. Take first 2 tablets together, then 1 every day until finished. Patient not taking: Reported on 04/17/2021 04/03/21   Prosperi, Christian H, [name]  Biotin w/ Vitamins C & E (HAIR/SKIN/NAILS PO) Take 1-2 tablets by mouth See  admin instructions. Take 1 tablet in the morning & take 2 tablets in the evening.    [provider]  Blood Glucose Calibration (ACCU-CHEK AVIVA) SOLN     [provider]  cetirizine (ZYRTEC) 10 MG tablet Take 10 mg by mouth daily. 02/18/17   [provider]  cholecalciferol (VITAMIN D) 25 MCG (1000 UNIT) tablet Take 1,000 Units by mouth in the morning and at bedtime.    [provider]  COSENTYX SENSOREADY, 300 MG, 150 MG/ML SOAJ Inject into the skin. 03/14/21   [provider]  DROPLET PEN NEEDLES 32G X 4 MM MISC  07/13/20   [provider]  escitalopram (LEXAPRO) 10 MG tablet Take 10 mg by mouth daily. 11/23/20   [provider]  Evolocumab with Infusor ([place]angle) 420 MG/3.5ML SOCT Inject 420 mg into the skin every 30 (thirty) days. 04/26/20   Kroeger, Lorelee Cover., [name]  famotidine (PEPCID) 20 MG tablet Take 20 mg by mouth at bedtime as needed for heartburn or indigestion.    [provider]  guaiFENesin (MUCINEX) 600 MG 12 hr tablet Take 600 mg by mouth 2 (two) times daily as needed (congestion.).    [provider]  HYDROcodone-acetaminophen (NORCO/VICODIN) 5-325 MG tablet Take 1-2 tablets by mouth every 6 (six) hours as needed. Patient taking differently: Take 1-2 tablets by mouth every 6 (six) hours as needed for moderate pain. 04/03/21   Prosperi, Christian H, [name]  isosorbide  mononitrate (IMDUR) 30 MG 24 hr tablet Take 1 tablet (30 mg total) by mouth daily. 04/26/20   Kroeger, Lorelee Cover., [name]  liraglutide (VICTOZA) 18 MG/3ML SOPN Inject 1.8 mg into the skin at bedtime.     [provider]  metFORMIN (GLUCOPHAGE) 1000 MG tablet Take 1,000 mg by mouth 2 (two) times daily. 04/06/15   [provider]  metoprolol tartrate (LOPRESSOR) 25 MG tablet Take one and a half (1.5) tablets (37.5 mg) by mouth twice (2) daily. Patient taking differently: Take 37.5 mg by mouth 2 (two) times daily. 09/15/17    Martinique, Peter M, MD  Multiple Vitamin (MULTIVITAMIN WITH MINERALS) TABS tablet Take 1 tablet by mouth daily. Centrum Silver    [provider]  nitroGLYCERIN (NITROSTAT) 0.4 MG SL tablet DISSOLVE 1 TABLET UNDER THE TONGUE EVERY 5 MINUTES AS  NEEDED FOR CHEST PAIN. MAX  OF 3 TABLETS IN 15 MINUTES. CALL 911 IF PAIN PERSISTS. Patient taking differently: Place 0.4 mg under the tongue every 5 (five) minutes as needed for chest pain. 11/15/18   Martinique, Peter M, MD  ondansetron (ZOFRAN) 4 MG tablet Take 1 tablet (4 mg total) by mouth every 6 (six) hours. Patient taking differently: Take 4 mg by mouth every 8 (eight) hours as needed for vomiting or nausea. 04/03/21   Prosperi, Christian H, [name]  potassium chloride SA (KLOR-CON M) 20 MEQ tablet Take 1 tablet (20 mEq total) by mouth 2 (two) times daily. 04/18/21   Maudie Flakes, MD  Potassium Citrate 15 MEQ (1620 MG) TBCR Take 15 mg by mouth in the morning and at bedtime. 10/05/19   [provider]  predniSONE (STERAPRED UNI-PAK 21 TAB) 10 MG (21) TBPK tablet Take by mouth daily. Take 6 tabs by mouth daily  for 2 days, then 5 tabs for 2 days, then 4 tabs for 2 days, then 3 tabs for 2 days, 2 tabs for 2 days, then 1 tab by mouth daily for 2 days Patient not taking: Reported on 04/17/2021 04/03/21   Prosperi, Christian H, [name]  sucralfate (CARAFATE) 1 g tablet Take 1 tablet (1 g total) by mouth 4 (four) times daily as needed. Patient taking differently: Take 1 g by mouth 4 (four) times daily as needed (stomach pain). 04/06/21   Maudie Flakes, MD  tamsulosin (FLOMAX) 0.4 MG CAPS capsule Take 1 capsule (0.4 mg total) by mouth daily after supper. Patient not taking: Reported on 04/06/2021 06/07/20   Larene Pickett, [name]      Allergies    Apremilast    Review of Systems   Review of Systems  Constitutional:        Per HPI, otherwise negative  HENT:         Per HPI, otherwise negative  Respiratory:         Per HPI, otherwise negative   Cardiovascular:        Per HPI, otherwise negative  Gastrointestinal:  Negative for vomiting.  Endocrine:       Negative aside from HPI  Genitourinary:        Neg aside from HPI   Musculoskeletal:        Per HPI, otherwise negative  Skin: Negative.   Neurological:  Positive for dizziness. Negative for syncope.   Physical Exam Updated Vital Signs BP (!) 143/85    Pulse 78    Temp 98.8 F (37.1 C) (Oral)    Resp 20    SpO2 96%  Physical Exam Vitals  and nursing note reviewed.  Constitutional:      General: She is not in acute distress.    Appearance: She is well-developed.  HENT:     Head: Normocephalic and atraumatic.  Eyes:     Conjunctiva/sclera: Conjunctivae normal.  Cardiovascular:     Rate and Rhythm: Normal rate and regular rhythm.  Pulmonary:     Effort: Pulmonary effort is normal. No respiratory distress.     Breath sounds: Normal breath sounds. No stridor.  Abdominal:     General: There is no distension.  Musculoskeletal:     Cervical back: Neck supple.  Skin:    General: Skin is warm and dry.  Neurological:     Mental Status: She is alert and oriented to person, place, and time.     Cranial Nerves: No cranial nerve deficit.    ED Results / Procedures / Treatments   Labs (all labs ordered are listed, but only abnormal results are displayed) Labs Reviewed  BASIC METABOLIC PANEL - Abnormal; Notable for the following components:      Result Value   BUN 6 (*)    Anion gap 16 (*)    All other components within normal limits  CBC WITH DIFFERENTIAL/PLATELET - Abnormal; Notable for the following components:   WBC 3.3 (*)    RDW 16.5 (*)    Lymphs Abs 0.5 (*)    All other components within normal limits    EKG EKG Interpretation  Date/Time:  Thursday April 25 2021 11:34:16 EST Ventricular Rate:  96 PR Interval:  150 QRS Duration: 92 QT Interval:  350 QTC Calculation: 442 R Axis:   110 Text Interpretation: Normal sinus rhythm Left posterior  fascicular block Inferior infarct , age undetermined Anterior infarct , age undetermined Abnormal ECG Confirmed by Carmin Muskrat [phone]) on 04/25/2021 6:12:47 PM  Radiology CT Head Wo Contrast  Result Date: 04/25/2021 CLINICAL DATA:  Head and neck trauma.  Focal neural findings EXAM: CT HEAD WITHOUT CONTRAST CT CERVICAL SPINE WITHOUT CONTRAST TECHNIQUE: Multidetector CT imaging of the head and cervical spine was performed following the standard protocol without intravenous contrast. Multiplanar CT image reconstructions of the cervical spine were also generated. RADIATION DOSE REDUCTION: This exam was performed according to the departmental dose-optimization program which includes automated exposure control, adjustment of the mA and/or kV according to patient size and/or use of iterative reconstruction technique. COMPARISON:  MRI head 04/18/2021 FINDINGS: CT HEAD FINDINGS Brain: Ventricle size and cerebral volume within normal limits. Mild hypodensity in the white matter. Small hypodensity left post subinsular white matter unchanged from MRI and consistent with chronic ischemia Negative for acute infarct, hemorrhage, mass Vascular: Negative for hyperdense vessel Skull: Negative Sinuses/Orbits: Mild mucosal edema paranasal sinuses. Negative orbit Other: None CT CERVICAL SPINE FINDINGS Alignment: Normal Skull base and vertebrae: Negative for acute fracture. Chronic healed fracture left first rib. Soft tissues and spinal canal: Negative for soft tissue mass or adenopathy. Mild thyromegaly. Disc levels: Mild disc degeneration and mild spurring C3-4 and C4-5. Negative for spinal or foraminal stenosis. Degenerative changes C1-2. Upper chest: Small right pleural effusion Other: None IMPRESSION: 1. No acute intracranial abnormality 2. Negative for cervical spine fracture. Electronically Signed   By: Franchot Gallo M.D.   On: 04/25/2021 12:10   CT Cervical Spine Wo Contrast  Result Date: 04/25/2021 CLINICAL DATA:  Head  and neck trauma.  Focal neural findings EXAM: CT HEAD WITHOUT CONTRAST CT CERVICAL SPINE WITHOUT CONTRAST TECHNIQUE: Multidetector CT imaging of the head and  cervical spine was performed following the standard protocol without intravenous contrast. Multiplanar CT image reconstructions of the cervical spine were also generated. RADIATION DOSE REDUCTION: This exam was performed according to the departmental dose-optimization program which includes automated exposure control, adjustment of the mA and/or kV according to patient size and/or use of iterative reconstruction technique. COMPARISON:  MRI head 04/18/2021 FINDINGS: CT HEAD FINDINGS Brain: Ventricle size and cerebral volume within normal limits. Mild hypodensity in the white matter. Small hypodensity left post subinsular white matter unchanged from MRI and consistent with chronic ischemia Negative for acute infarct, hemorrhage, mass Vascular: Negative for hyperdense vessel Skull: Negative Sinuses/Orbits: Mild mucosal edema paranasal sinuses. Negative orbit Other: None CT CERVICAL SPINE FINDINGS Alignment: Normal Skull base and vertebrae: Negative for acute fracture. Chronic healed fracture left first rib. Soft tissues and spinal canal: Negative for soft tissue mass or adenopathy. Mild thyromegaly. Disc levels: Mild disc degeneration and mild spurring C3-4 and C4-5. Negative for spinal or foraminal stenosis. Degenerative changes C1-2. Upper chest: Small right pleural effusion Other: None IMPRESSION: 1. No acute intracranial abnormality 2. Negative for cervical spine fracture. Electronically Signed   By: Franchot Gallo M.D.   On: 04/25/2021 12:10    Procedures Procedures    Medications Ordered in ED Medications - No data to display  ED Course/ Medical Decision Making/ A&P  This patient presents to the ED for concern of fall, in the context of dizziness, lightheadedness, this involves an extensive number of treatment options, and is a complaint that  carries with it a high risk of complications and morbidity.  The differential diagnosis includes pathology both before the fall and afterwards, including consideration dehydration versus electrolyte abnormalities versus stroke, with post trauma consequences.   Co morbidities that complicate the patient evaluation  Hypertension, diabetes   Social Determinants of Health:  Age   Additional history obtained:  Additional history and/or information obtained from EMR, including MRI performed within the past week External records from outside source obtained and reviewed including MRI without acute intracranial abnormality, there is evidence for chronic infarct   After the initial evaluation, orders, including: CT, head, neck, labs were initiated.  Performed from triage  Patient placed on Cardiac and Pulse-Oximetry Monitors. The patient was maintained on a cardiac monitor.  The cardiac monitored showed an rhythm of sinus 80s unremarkable The patient was also maintained on pulse oximetry. The readings were typically 99% room air  On repeat evaluation of the patient stayed the same  Lab Tests:  I personally interpreted labs.  The pertinent results include: No substantial electrolyte normalities, no substantial dehydration  Imaging Studies ordered:  I independently visualized and interpreted imaging which showed no intracranial hemorrhage, no for I agree with the radiologist interpretation   Dispostion / Final MDM:  After consideration of the diagnostic results and the patient's response to treatment, she is appropriate for discharge, though given her ongoing dizziness, lightheadedness hospitalization was considered.  But with reassuring ECG, labs, CT, and her description of dizziness has been largely chronic, discharged in stable condition.    Final Clinical Impression(s) / ED Diagnoses Final diagnoses:  Fall, initial encounter     Carmin Muskrat, MD 04/25/21 [phone]

## 2021-04-25 NOTE — ED Notes (Signed)
Pt verbalizes understanding of discharge instructions. Opportunity for questions and answers were provided. Pt discharged from the ED.   ?

## 2021-04-25 NOTE — Discharge Instructions (Signed)
As discussed, your evaluation today has been largely reassuring.  But, it is important that you monitor your condition carefully, and do not hesitate to return to the ED if you develop new, or concerning changes in your condition.  Otherwise, please follow-up with your physician for appropriate ongoing care.  In particular, discussed today's fall, and your ongoing evaluation for dizziness, with your physicians soon as possible.

## 2021-04-25 NOTE — ED Provider Triage Note (Signed)
Emergency Medicine Provider Triage Evaluation Note  [name] [name] , a 61 y.o. female  was evaluated in triage.  Pt complains of fall.  Patient states that she was leaving her gastroenterologist appointment this morning, walking to her car when she lost her balance and fell.  States that she hit the back of her head on the concrete.  She denies loss of consciousness..  Not anticoagulated.  She does report some dizziness which she states is more chronic.  States that she was dizzy prior to falling.  Review of Systems  Positive: See above Negative:   Physical Exam  BP (!) 150/85 (BP Location: Right Arm)    Pulse 89    Temp 98.5 F (36.9 C) (Oral)    Resp 17    SpO2 94%  Gen:   Awake, no distress   Resp:  Normal effort  MSK:   Moves extremities without difficulty  Other:  Walker bound at baseline. No focal neurological deficits. No extremity injuries. Hematoma to the right occiput. No laceration present.   Medical Decision Making  Medically screening exam initiated at 11:19 AM.  Appropriate orders placed.  SHANNAN SLINKER was informed that the remainder of the evaluation will be completed by another provider, this initial triage assessment does not replace that evaluation, and the importance of remaining in the ED until their evaluation is complete.  Will obtain labs given dizziness.    Mickie Hillier, [name] 04/25/21 1121

## 2021-04-25 NOTE — ED Triage Notes (Signed)
Pt reports ongoing dizziness, had fall when walking into dr appt. Fell back and hit head on cement, no loc.

## 2021-04-30 ENCOUNTER — Emergency Department (HOSPITAL_COMMUNITY): Payer: Medicare HMO

## 2021-04-30 ENCOUNTER — Inpatient Hospital Stay (HOSPITAL_COMMUNITY)
Admission: EM | Admit: 2021-04-30 | Discharge: 2021-05-09 | DRG: 871 | Disposition: A | Discharge status: Skilled Nursing Facility | Payer: Medicare HMO | Attending: Internal Medicine | Admitting: Internal Medicine

## 2021-04-30 ENCOUNTER — Other Ambulatory Visit: Payer: Self-pay

## 2021-04-30 ENCOUNTER — Encounter (HOSPITAL_COMMUNITY): Payer: Self-pay

## 2021-04-30 DIAGNOSIS — L89322 Pressure ulcer of left buttock, stage 2: Secondary | ICD-10-CM | POA: Diagnosis present

## 2021-04-30 DIAGNOSIS — Z87442 Personal history of urinary calculi: Secondary | ICD-10-CM

## 2021-04-30 DIAGNOSIS — Z6826 Body mass index (BMI) 26.0-26.9, adult: Secondary | ICD-10-CM

## 2021-04-30 DIAGNOSIS — R262 Difficulty in walking, not elsewhere classified: Secondary | ICD-10-CM | POA: Diagnosis not present

## 2021-04-30 DIAGNOSIS — M069 Rheumatoid arthritis, unspecified: Secondary | ICD-10-CM | POA: Diagnosis present

## 2021-04-30 DIAGNOSIS — Z23 Encounter for immunization: Secondary | ICD-10-CM | POA: Diagnosis present

## 2021-04-30 DIAGNOSIS — Z741 Need for assistance with personal care: Secondary | ICD-10-CM | POA: Diagnosis not present

## 2021-04-30 DIAGNOSIS — I2510 Atherosclerotic heart disease of native coronary artery without angina pectoris: Secondary | ICD-10-CM | POA: Diagnosis present

## 2021-04-30 DIAGNOSIS — E871 Hypo-osmolality and hyponatremia: Secondary | ICD-10-CM | POA: Diagnosis not present

## 2021-04-30 DIAGNOSIS — I517 Cardiomegaly: Secondary | ICD-10-CM | POA: Diagnosis not present

## 2021-04-30 DIAGNOSIS — N179 Acute kidney failure, unspecified: Secondary | ICD-10-CM | POA: Diagnosis present

## 2021-04-30 DIAGNOSIS — N170 Acute kidney failure with tubular necrosis: Secondary | ICD-10-CM | POA: Diagnosis not present

## 2021-04-30 DIAGNOSIS — Z8249 Family history of ischemic heart disease and other diseases of the circulatory system: Secondary | ICD-10-CM

## 2021-04-30 DIAGNOSIS — J9811 Atelectasis: Secondary | ICD-10-CM | POA: Diagnosis not present

## 2021-04-30 DIAGNOSIS — I213 ST elevation (STEMI) myocardial infarction of unspecified site: Secondary | ICD-10-CM | POA: Diagnosis not present

## 2021-04-30 DIAGNOSIS — R532 Functional quadriplegia: Secondary | ICD-10-CM | POA: Diagnosis not present

## 2021-04-30 DIAGNOSIS — R404 Transient alteration of awareness: Secondary | ICD-10-CM | POA: Diagnosis not present

## 2021-04-30 DIAGNOSIS — L4050 Arthropathic psoriasis, unspecified: Secondary | ICD-10-CM | POA: Diagnosis present

## 2021-04-30 DIAGNOSIS — Z7982 Long term (current) use of aspirin: Secondary | ICD-10-CM

## 2021-04-30 DIAGNOSIS — R1312 Dysphagia, oropharyngeal phase: Secondary | ICD-10-CM | POA: Diagnosis not present

## 2021-04-30 DIAGNOSIS — R2681 Unsteadiness on feet: Secondary | ICD-10-CM | POA: Diagnosis not present

## 2021-04-30 DIAGNOSIS — Z820 Family history of epilepsy and other diseases of the nervous system: Secondary | ICD-10-CM

## 2021-04-30 DIAGNOSIS — G9340 Encephalopathy, unspecified: Secondary | ICD-10-CM | POA: Diagnosis not present

## 2021-04-30 DIAGNOSIS — R4702 Dysphasia: Secondary | ICD-10-CM | POA: Diagnosis not present

## 2021-04-30 DIAGNOSIS — Z87891 Personal history of nicotine dependence: Secondary | ICD-10-CM

## 2021-04-30 DIAGNOSIS — E042 Nontoxic multinodular goiter: Secondary | ICD-10-CM | POA: Diagnosis present

## 2021-04-30 DIAGNOSIS — R410 Disorientation, unspecified: Secondary | ICD-10-CM

## 2021-04-30 DIAGNOSIS — E870 Hyperosmolality and hypernatremia: Secondary | ICD-10-CM

## 2021-04-30 DIAGNOSIS — R0689 Other abnormalities of breathing: Secondary | ICD-10-CM | POA: Diagnosis not present

## 2021-04-30 DIAGNOSIS — I5042 Chronic combined systolic (congestive) and diastolic (congestive) heart failure: Secondary | ICD-10-CM | POA: Diagnosis present

## 2021-04-30 DIAGNOSIS — L8990 Pressure ulcer of unspecified site, unspecified stage: Secondary | ICD-10-CM | POA: Insufficient documentation

## 2021-04-30 DIAGNOSIS — I25810 Atherosclerosis of coronary artery bypass graft(s) without angina pectoris: Secondary | ICD-10-CM | POA: Diagnosis present

## 2021-04-30 DIAGNOSIS — R1310 Dysphagia, unspecified: Secondary | ICD-10-CM | POA: Diagnosis present

## 2021-04-30 DIAGNOSIS — E519 Thiamine deficiency, unspecified: Secondary | ICD-10-CM | POA: Diagnosis present

## 2021-04-30 DIAGNOSIS — Z79899 Other long term (current) drug therapy: Secondary | ICD-10-CM

## 2021-04-30 DIAGNOSIS — A419 Sepsis, unspecified organism: Secondary | ICD-10-CM | POA: Diagnosis present

## 2021-04-30 DIAGNOSIS — J90 Pleural effusion, not elsewhere classified: Secondary | ICD-10-CM

## 2021-04-30 DIAGNOSIS — N390 Urinary tract infection, site not specified: Secondary | ICD-10-CM

## 2021-04-30 DIAGNOSIS — Z4659 Encounter for fitting and adjustment of other gastrointestinal appliance and device: Secondary | ICD-10-CM

## 2021-04-30 DIAGNOSIS — R6521 Severe sepsis with septic shock: Secondary | ICD-10-CM | POA: Diagnosis present

## 2021-04-30 DIAGNOSIS — I248 Other forms of acute ischemic heart disease: Secondary | ICD-10-CM | POA: Diagnosis present

## 2021-04-30 DIAGNOSIS — Z20822 Contact with and (suspected) exposure to covid-19: Secondary | ICD-10-CM | POA: Diagnosis present

## 2021-04-30 DIAGNOSIS — Z7984 Long term (current) use of oral hypoglycemic drugs: Secondary | ICD-10-CM

## 2021-04-30 DIAGNOSIS — F32A Depression, unspecified: Secondary | ICD-10-CM | POA: Diagnosis present

## 2021-04-30 DIAGNOSIS — E785 Hyperlipidemia, unspecified: Secondary | ICD-10-CM | POA: Diagnosis present

## 2021-04-30 DIAGNOSIS — G9341 Metabolic encephalopathy: Secondary | ICD-10-CM | POA: Diagnosis present

## 2021-04-30 DIAGNOSIS — L89312 Pressure ulcer of right buttock, stage 2: Secondary | ICD-10-CM | POA: Diagnosis present

## 2021-04-30 DIAGNOSIS — Z0189 Encounter for other specified special examinations: Secondary | ICD-10-CM

## 2021-04-30 DIAGNOSIS — I700 Atherosclerosis of aorta: Secondary | ICD-10-CM | POA: Diagnosis not present

## 2021-04-30 DIAGNOSIS — J869 Pyothorax without fistula: Secondary | ICD-10-CM | POA: Diagnosis present

## 2021-04-30 DIAGNOSIS — F419 Anxiety disorder, unspecified: Secondary | ICD-10-CM | POA: Diagnosis present

## 2021-04-30 DIAGNOSIS — R32 Unspecified urinary incontinence: Secondary | ICD-10-CM | POA: Diagnosis present

## 2021-04-30 DIAGNOSIS — G8929 Other chronic pain: Secondary | ICD-10-CM | POA: Diagnosis present

## 2021-04-30 DIAGNOSIS — E876 Hypokalemia: Secondary | ICD-10-CM

## 2021-04-30 DIAGNOSIS — Z90710 Acquired absence of both cervix and uterus: Secondary | ICD-10-CM

## 2021-04-30 DIAGNOSIS — R4182 Altered mental status, unspecified: Secondary | ICD-10-CM | POA: Diagnosis present

## 2021-04-30 DIAGNOSIS — E11649 Type 2 diabetes mellitus with hypoglycemia without coma: Secondary | ICD-10-CM | POA: Diagnosis not present

## 2021-04-30 DIAGNOSIS — I69391 Dysphagia following cerebral infarction: Secondary | ICD-10-CM | POA: Diagnosis not present

## 2021-04-30 DIAGNOSIS — E440 Moderate protein-calorie malnutrition: Secondary | ICD-10-CM | POA: Diagnosis present

## 2021-04-30 DIAGNOSIS — M6281 Muscle weakness (generalized): Secondary | ICD-10-CM | POA: Diagnosis not present

## 2021-04-30 DIAGNOSIS — M6259 Muscle wasting and atrophy, not elsewhere classified, multiple sites: Secondary | ICD-10-CM | POA: Diagnosis not present

## 2021-04-30 DIAGNOSIS — R000 Tachycardia, unspecified: Secondary | ICD-10-CM | POA: Diagnosis not present

## 2021-04-30 DIAGNOSIS — I110 Hypertensive heart disease with heart failure: Secondary | ICD-10-CM | POA: Diagnosis present

## 2021-04-30 DIAGNOSIS — E872 Acidosis, unspecified: Secondary | ICD-10-CM | POA: Diagnosis not present

## 2021-04-30 DIAGNOSIS — R338 Other retention of urine: Secondary | ICD-10-CM | POA: Diagnosis present

## 2021-04-30 DIAGNOSIS — R42 Dizziness and giddiness: Secondary | ICD-10-CM | POA: Diagnosis not present

## 2021-04-30 DIAGNOSIS — Z7401 Bed confinement status: Secondary | ICD-10-CM | POA: Diagnosis not present

## 2021-04-30 DIAGNOSIS — E861 Hypovolemia: Secondary | ICD-10-CM | POA: Diagnosis present

## 2021-04-30 DIAGNOSIS — Z4682 Encounter for fitting and adjustment of non-vascular catheter: Secondary | ICD-10-CM | POA: Diagnosis not present

## 2021-04-30 DIAGNOSIS — R509 Fever, unspecified: Secondary | ICD-10-CM | POA: Diagnosis not present

## 2021-04-30 DIAGNOSIS — Z818 Family history of other mental and behavioral disorders: Secondary | ICD-10-CM

## 2021-04-30 DIAGNOSIS — R319 Hematuria, unspecified: Secondary | ICD-10-CM | POA: Diagnosis present

## 2021-04-30 DIAGNOSIS — K219 Gastro-esophageal reflux disease without esophagitis: Secondary | ICD-10-CM | POA: Diagnosis present

## 2021-04-30 DIAGNOSIS — Z8701 Personal history of pneumonia (recurrent): Secondary | ICD-10-CM

## 2021-04-30 DIAGNOSIS — D6959 Other secondary thrombocytopenia: Secondary | ICD-10-CM | POA: Diagnosis present

## 2021-04-30 DIAGNOSIS — Z823 Family history of stroke: Secondary | ICD-10-CM

## 2021-04-30 DIAGNOSIS — Z888 Allergy status to other drugs, medicaments and biological substances status: Secondary | ICD-10-CM

## 2021-04-30 DIAGNOSIS — Z91040 Latex allergy status: Secondary | ICD-10-CM

## 2021-04-30 DIAGNOSIS — R5381 Other malaise: Secondary | ICD-10-CM | POA: Diagnosis present

## 2021-04-30 DIAGNOSIS — Z9889 Other specified postprocedural states: Secondary | ICD-10-CM

## 2021-04-30 DIAGNOSIS — Z803 Family history of malignant neoplasm of breast: Secondary | ICD-10-CM

## 2021-04-30 DIAGNOSIS — M160 Bilateral primary osteoarthritis of hip: Secondary | ICD-10-CM | POA: Diagnosis not present

## 2021-04-30 DIAGNOSIS — J948 Other specified pleural conditions: Secondary | ICD-10-CM | POA: Diagnosis not present

## 2021-04-30 DIAGNOSIS — I5030 Unspecified diastolic (congestive) heart failure: Secondary | ICD-10-CM | POA: Diagnosis not present

## 2021-04-30 LAB — CBC WITH DIFFERENTIAL/PLATELET
Abs Immature Granulocytes: 0.16 10*3/uL — ABNORMAL HIGH (ref 0.00–0.07)
Basophils Absolute: 0 10*3/uL (ref 0.0–0.1)
Basophils Relative: 0 %
Eosinophils Absolute: 0 10*3/uL (ref 0.0–0.5)
Eosinophils Relative: 0 %
HCT: 53.1 % — ABNORMAL HIGH (ref 36.0–46.0)
Hemoglobin: 16.4 g/dL — ABNORMAL HIGH (ref 12.0–15.0)
Immature Granulocytes: 2 %
Lymphocytes Relative: 5 %
Lymphs Abs: 0.5 10*3/uL — ABNORMAL LOW (ref 0.7–4.0)
MCH: 29.5 pg (ref 26.0–34.0)
MCHC: 30.9 g/dL (ref 30.0–36.0)
MCV: 95.5 fL (ref 80.0–100.0)
Monocytes Absolute: 0.6 10*3/uL (ref 0.1–1.0)
Monocytes Relative: 7 %
Neutro Abs: 8.4 10*3/uL — ABNORMAL HIGH (ref 1.7–7.7)
Neutrophils Relative %: 86 %
Platelets: 201 10*3/uL (ref 150–400)
RBC: 5.56 MIL/uL — ABNORMAL HIGH (ref 3.87–5.11)
RDW: 17.2 % — ABNORMAL HIGH (ref 11.5–15.5)
WBC: 9.7 10*3/uL (ref 4.0–10.5)
nRBC: 1 % — ABNORMAL HIGH (ref 0.0–0.2)

## 2021-04-30 LAB — COMPREHENSIVE METABOLIC PANEL
ALT: 15 U/L (ref 0–44)
AST: 35 U/L (ref 15–41)
Albumin: 3.1 g/dL — ABNORMAL LOW (ref 3.5–5.0)
Alkaline Phosphatase: 49 U/L (ref 38–126)
Anion gap: 24 — ABNORMAL HIGH (ref 5–15)
BUN: 18 mg/dL (ref 8–23)
CO2: 20 mmol/L — ABNORMAL LOW (ref 22–32)
Calcium: 9.6 mg/dL (ref 8.9–10.3)
Chloride: 110 mmol/L (ref 98–111)
Creatinine, Ser: 1.94 mg/dL — ABNORMAL HIGH (ref 0.44–1.00)
GFR, Estimated: 29 mL/min — ABNORMAL LOW (ref >60)
Glucose, Bld: 123 mg/dL — ABNORMAL HIGH (ref 70–99)
Potassium: 3.2 mmol/L — ABNORMAL LOW (ref 3.5–5.1)
Sodium: 154 mmol/L — ABNORMAL HIGH (ref 135–145)
Total Bilirubin: 1.2 mg/dL (ref 0.3–1.2)
Total Protein: 8.2 g/dL — ABNORMAL HIGH (ref 6.5–8.1)

## 2021-04-30 LAB — URINALYSIS, ROUTINE W REFLEX MICROSCOPIC
Bilirubin Urine: NEGATIVE
Glucose, UA: NEGATIVE mg/dL
Ketones, ur: 20 mg/dL — AB
Nitrite: NEGATIVE
Protein, ur: 100 mg/dL — AB
Specific Gravity, Urine: 1.021 (ref 1.005–1.030)
pH: 6 (ref 5.0–8.0)

## 2021-04-30 LAB — I-STAT ARTERIAL BLOOD GAS, ED
Acid-base deficit: 3 mmol/L — ABNORMAL HIGH (ref 0.0–2.0)
Bicarbonate: 21.2 mmol/L (ref 20.0–28.0)
Calcium, Ion: 1.21 mmol/L (ref 1.15–1.40)
HCT: 39 % (ref 36.0–46.0)
Hemoglobin: 13.3 g/dL (ref 12.0–15.0)
O2 Saturation: 93 %
Patient temperature: 98
Potassium: 3.1 mmol/L — ABNORMAL LOW (ref 3.5–5.1)
Sodium: 156 mmol/L — ABNORMAL HIGH (ref 135–145)
TCO2: 22 mmol/L (ref 22–32)
pCO2 arterial: 35 mmHg (ref 32–48)
pH, Arterial: 7.389 (ref 7.35–7.45)
pO2, Arterial: 67 mmHg — ABNORMAL LOW (ref 83–108)

## 2021-04-30 LAB — RESP PANEL BY RT-PCR (FLU A&B, COVID) ARPGX2
Influenza A by PCR: NEGATIVE
Influenza B by PCR: NEGATIVE
SARS Coronavirus 2 by RT PCR: NEGATIVE

## 2021-04-30 LAB — PROTIME-INR
INR: 1.2 (ref 0.8–1.2)
Prothrombin Time: 15.3 seconds — ABNORMAL HIGH (ref 11.4–15.2)

## 2021-04-30 LAB — APTT: aPTT: 29 seconds (ref 24–36)

## 2021-04-30 LAB — LACTIC ACID, PLASMA
Lactic Acid, Venous: 8.7 mmol/L (ref 0.5–1.9)
Lactic Acid, Venous: 8.3 mmol/L (ref 0.5–1.9)

## 2021-04-30 LAB — CBG MONITORING, ED: Glucose-Capillary: 107 mg/dL — ABNORMAL HIGH (ref 70–99)

## 2021-04-30 MED ORDER — LACTATED RINGERS IV SOLN
INTRAVENOUS | Status: DC
  Administered 2021-05-01: 150 mL/h via INTRAVENOUS

## 2021-04-30 MED ORDER — POTASSIUM CHLORIDE 10 MEQ/100ML IV SOLN
10.0000 meq | INTRAVENOUS | Status: AC
  Administered 2021-04-30 – 2021-05-01 (×2): 10 meq via INTRAVENOUS
  Filled 2021-04-30 (×2): qty 100

## 2021-04-30 MED ORDER — SODIUM CHLORIDE 0.9 % IV SOLN
2.0000 g | Freq: Three times a day (TID) | INTRAVENOUS | Status: DC
  Administered 2021-04-30 – 2021-05-01 (×2): 2 g via INTRAVENOUS
  Filled 2021-04-30 (×2): qty 2

## 2021-04-30 MED ORDER — VANCOMYCIN HCL 1500 MG/300ML IV SOLN: 1500.0000 mg | INTRAVENOUS | Status: DC

## 2021-04-30 MED ORDER — METRONIDAZOLE 500 MG/100ML IV SOLN
500.0000 mg | Freq: Once | INTRAVENOUS | Status: AC
Start: 2021-04-30 — End: 2021-04-30
  Administered 2021-04-30: 500 mg via INTRAVENOUS
  Filled 2021-04-30: qty 100

## 2021-04-30 MED ORDER — LACTATED RINGERS IV BOLUS (SEPSIS)
500.0000 mL | Freq: Once | INTRAVENOUS | Status: AC
  Administered 2021-04-30: 500 mL via INTRAVENOUS

## 2021-04-30 MED ORDER — VANCOMYCIN HCL 1750 MG/350ML IV SOLN
1750.0000 mg | Freq: Once | INTRAVENOUS | Status: AC
  Administered 2021-04-30: 1750 mg via INTRAVENOUS
  Filled 2021-04-30: qty 350

## 2021-04-30 MED ORDER — LACTATED RINGERS IV BOLUS (SEPSIS)
1000.0000 mL | Freq: Once | INTRAVENOUS | Status: AC
  Administered 2021-04-30: 1000 mL via INTRAVENOUS

## 2021-04-30 NOTE — Progress Notes (Signed)
Pharmacy Antibiotic Note  [name] [name] is a 61 y.o. female admitted on 04/30/2021 with infection of unknown source .  Pharmacy has been consulted for vancomycin and cefepime dosing.  Plan: Vancomycin 1750 mg x1 Vancomycin 1500 mg q24h (eAUC=488.4, goal AUC=400-550, SCr used = 0.91) Cefepime 2g q8h Trend WBC, temp, renal function  F/U infectious work-up Drug levels as indicated   Height: 5\' 6"  (167.6 cm) Weight: 81.6 kg (179 lb 14.3 oz) IBW/kg (Calculated) : 59.3  Temp (24hrs), Avg:100.9 F (38.3 C), Min:100.9 F (38.3 C), Max:100.9 F (38.3 C)  Recent Labs  Lab 04/25/21 1128  WBC 3.3*  CREATININE 0.91    Estimated Creatinine Clearance: 69 mL/min (by C-G formula based on SCr of 0.91 mg/dL).    Allergies  Allergen Reactions   Apremilast Other (See Comments)    Antimicrobials this admission: Vancomycin 2/14 >>  Cefepime 2/14 >>  Metronidazole x1  Dose adjustments this admission: None  Microbiology results: 2/14 BCx: pending 2/14 UCx: pending   Thank you for allowing pharmacy to be a part of this patients care.  Joseph Art, Pharm.D. PGY-1 Pharmacy Resident [phone] 04/30/2021 7:07 PM

## 2021-04-30 NOTE — ED Notes (Signed)
1st set of cultures obtained at IV start

## 2021-04-30 NOTE — ED Provider Notes (Signed)
[facility] EMERGENCY DEPARTMENT Provider Note   CSN: [id number] Arrival date & time: 04/30/21  1817     History  Chief Complaint  Patient presents with   Code Sepsis    [name] [name] is a 61 y.o. female.  HPI Patient arrives by EMS with concern for sepsis.  She was found by her son.  She was lying partially in the bed and was altered.  He last saw her 3 days ago when she was well.  She has diabetes.  In the field she was hypotensive and an IO was started.  Her blood pressure improved with 200 cc IV fluid bolus    Home Medications Prior to Admission medications   Medication Sig Start Date End Date Taking? Authorizing Provider  ACCU-CHEK GUIDE test strip  03/20/21   [provider]  Accu-Chek Softclix Lancets lancets     [provider]  acetaminophen (TYLENOL) 325 MG tablet Take 650 mg by mouth every 6 (six) hours as needed for moderate pain or headache.    [provider]  amLODipine (NORVASC) 5 MG tablet Take 1 tablet (5 mg total) by mouth daily. 04/26/20   Kroeger, Lorelee Cover., [name]  amoxicillin-clavulanate (AUGMENTIN) 875-125 MG tablet Take 1 tablet by mouth every 12 (twelve) hours. Patient not taking: Reported on 04/17/2021 04/03/21   Prosperi, Christian H, [name]  aspirin EC 81 MG EC tablet Take 1 tablet (81 mg total) by mouth daily. 06/25/15   Nani Skillern, [name]  atorvastatin (LIPITOR) 80 MG tablet TAKE 1 TABLET EVERY DAY  AT  6PM Patient taking differently: Take 80 mg by mouth every evening. 05/29/17   Martinique, Peter M, MD  azithromycin (ZITHROMAX) 250 MG tablet Take 1 tablet (250 mg total) by mouth daily. Take first 2 tablets together, then 1 every day until finished. Patient not taking: Reported on 04/17/2021 04/03/21   Prosperi, Christian H, [name]  Biotin w/ Vitamins C & E (HAIR/SKIN/NAILS PO) Take 1-2 tablets by mouth See admin instructions. Take 1 tablet in the morning & take 2 tablets in the evening.    [provider]   Blood Glucose Calibration (ACCU-CHEK AVIVA) SOLN     [provider]  cetirizine (ZYRTEC) 10 MG tablet Take 10 mg by mouth daily. 02/18/17   [provider]  cholecalciferol (VITAMIN D) 25 MCG (1000 UNIT) tablet Take 1,000 Units by mouth in the morning and at bedtime.    [provider]  COSENTYX SENSOREADY, 300 MG, 150 MG/ML SOAJ Inject into the skin. 03/14/21   [provider]  DROPLET PEN NEEDLES 32G X 4 MM MISC  07/13/20   [provider]  escitalopram (LEXAPRO) 10 MG tablet Take 10 mg by mouth daily. 11/23/20   [provider]  Evolocumab with Infusor ([place]) 420 MG/3.5ML SOCT Inject 420 mg into the skin every 30 (thirty) days. 04/26/20   Kroeger, Lorelee Cover., [name]  famotidine (PEPCID) 20 MG tablet Take 20 mg by mouth at bedtime as needed for heartburn or indigestion.    [provider]  guaiFENesin (MUCINEX) 600 MG 12 hr tablet Take 600 mg by mouth 2 (two) times daily as needed (congestion.).    [provider]  HYDROcodone-acetaminophen (NORCO/VICODIN) 5-325 MG tablet Take 1-2 tablets by mouth every 6 (six) hours as needed. Patient taking differently: Take 1-2 tablets by mouth every 6 (six) hours as needed for moderate pain. 04/03/21   Prosperi, Christian H, [name]  isosorbide mononitrate (IMDUR) 30  MG 24 hr tablet Take 1 tablet (30 mg total) by mouth daily. 04/26/20   Kroeger, Lorelee Cover., [name]  liraglutide (VICTOZA) 18 MG/3ML SOPN Inject 1.8 mg into the skin at bedtime.     [provider]  metFORMIN (GLUCOPHAGE) 1000 MG tablet Take 1,000 mg by mouth 2 (two) times daily. 04/06/15   [provider]  metoprolol tartrate (LOPRESSOR) 25 MG tablet Take one and a half (1.5) tablets (37.5 mg) by mouth twice (2) daily. Patient taking differently: Take 37.5 mg by mouth 2 (two) times daily. 09/15/17   Martinique, Peter M, MD  Multiple Vitamin (MULTIVITAMIN WITH MINERALS) TABS tablet Take 1 tablet by mouth  daily. Centrum Silver    [provider]  nitroGLYCERIN (NITROSTAT) 0.4 MG SL tablet DISSOLVE 1 TABLET UNDER THE TONGUE EVERY 5 MINUTES AS  NEEDED FOR CHEST PAIN. MAX  OF 3 TABLETS IN 15 MINUTES. CALL 911 IF PAIN PERSISTS. Patient taking differently: Place 0.4 mg under the tongue every 5 (five) minutes as needed for chest pain. 11/15/18   Martinique, Peter M, MD  ondansetron (ZOFRAN) 4 MG tablet Take 1 tablet (4 mg total) by mouth every 6 (six) hours. Patient taking differently: Take 4 mg by mouth every 8 (eight) hours as needed for vomiting or nausea. 04/03/21   Prosperi, Christian H, [name]  potassium chloride SA (KLOR-CON M) 20 MEQ tablet Take 1 tablet (20 mEq total) by mouth 2 (two) times daily. 04/18/21   Maudie Flakes, MD  Potassium Citrate 15 MEQ (1620 MG) TBCR Take 15 mg by mouth in the morning and at bedtime. 10/05/19   [provider]  predniSONE (STERAPRED UNI-PAK 21 TAB) 10 MG (21) TBPK tablet Take by mouth daily. Take 6 tabs by mouth daily  for 2 days, then 5 tabs for 2 days, then 4 tabs for 2 days, then 3 tabs for 2 days, 2 tabs for 2 days, then 1 tab by mouth daily for 2 days Patient not taking: Reported on 04/17/2021 04/03/21   Prosperi, Christian H, [name]  sucralfate (CARAFATE) 1 g tablet Take 1 tablet (1 g total) by mouth 4 (four) times daily as needed. Patient taking differently: Take 1 g by mouth 4 (four) times daily as needed (stomach pain). 04/06/21   Maudie Flakes, MD  tamsulosin (FLOMAX) 0.4 MG CAPS capsule Take 1 capsule (0.4 mg total) by mouth daily after supper. Patient not taking: Reported on 04/06/2021 06/07/20   Larene Pickett, [name]      Allergies    Apremilast    Review of Systems   Review of Systems  Physical Exam Updated Vital Signs BP (!) 173/99    Pulse 92    Temp 98 F (36.7 C) (Oral)    Resp 12    Ht 5\' 6"  (1.676 m)    Wt 81.6 kg    SpO2 95%    BMI 29.04 kg/m  Physical Exam Vitals and nursing note reviewed.  Constitutional:      General: She is  in acute distress.     Appearance: She is well-developed. She is toxic-appearing. She is not ill-appearing or diaphoretic.  HENT:     Head: Normocephalic and atraumatic.     Right Ear: External ear normal.     Left Ear: External ear normal.     Mouth/Throat:     Mouth: Mucous membranes are dry.     Pharynx: Posterior oropharyngeal erythema present. No oropharyngeal exudate.  Eyes:     General:  Right eye: Discharge present.        Left eye: Discharge present.    Pupils: Pupils are equal, round, and reactive to light.     Comments: No eye drainage  Neck:     Trachea: Phonation normal.  Cardiovascular:     Rate and Rhythm: Regular rhythm. Tachycardia present.     Heart sounds: Normal heart sounds.  Pulmonary:     Effort: Pulmonary effort is normal. No respiratory distress.     Breath sounds: Normal breath sounds. No stridor.  Abdominal:     General: There is no distension.     Palpations: Abdomen is soft.     Tenderness: There is no abdominal tenderness.  Musculoskeletal:        General: Normal range of motion.     Cervical back: Normal range of motion and neck supple.     Comments: I/O catheter in right proximal tibia.  Skin:    General: Skin is warm and dry.  Neurological:     Mental Status: She is alert.     Cranial Nerves: No cranial nerve deficit.     Motor: No abnormal muscle tone.     Coordination: Coordination normal.     Comments: Eyes open, alert, follows commands.  Equal grip strength bilaterally.  Able to move legs somewhat, but cannot lift legs off stretcher.  Psychiatric:        Mood and Affect: Mood normal.        Behavior: Behavior normal.    ED Results / Procedures / Treatments   Labs (all labs ordered are listed, but only abnormal results are displayed) Labs Reviewed  COMPREHENSIVE METABOLIC PANEL - Abnormal; Notable for the following components:      Result Value   Sodium 154 (*)    Potassium 3.2 (*)    CO2 20 (*)    Glucose, Bld 123 (*)     Creatinine, Ser 1.94 (*)    Total Protein 8.2 (*)    Albumin 3.1 (*)    GFR, Estimated 29 (*)    Anion gap 24 (*)    All other components within normal limits  LACTIC ACID, PLASMA - Abnormal; Notable for the following components:   Lactic Acid, Venous 8.7 (*)    All other components within normal limits  CBC WITH DIFFERENTIAL/PLATELET - Abnormal; Notable for the following components:   RBC 5.56 (*)    Hemoglobin 16.4 (*)    HCT 53.1 (*)    RDW 17.2 (*)    nRBC 1.0 (*)    Neutro Abs 8.4 (*)    Lymphs Abs 0.5 (*)    Abs Immature Granulocytes 0.16 (*)    All other components within normal limits  PROTIME-INR - Abnormal; Notable for the following components:   Prothrombin Time 15.3 (*)    All other components within normal limits  URINALYSIS, ROUTINE W REFLEX MICROSCOPIC - Abnormal; Notable for the following components:   Color, Urine AMBER (*)    APPearance HAZY (*)    Hgb urine dipstick SMALL (*)    Ketones, ur 20 (*)    Protein, ur 100 (*)    Leukocytes,Ua TRACE (*)    Bacteria, UA FEW (*)    All other components within normal limits  CBG MONITORING, ED - Abnormal; Notable for the following components:   Glucose-Capillary 107 (*)    All other components within normal limits  I-STAT ARTERIAL BLOOD GAS, ED - Abnormal; Notable for the following components:   pO2,  Arterial 67 (*)    Acid-base deficit 3.0 (*)    Sodium 156 (*)    Potassium 3.1 (*)    All other components within normal limits  RESP PANEL BY RT-PCR (FLU A&B, COVID) ARPGX2  CULTURE, BLOOD (ROUTINE X 2)  CULTURE, BLOOD (ROUTINE X 2)  URINE CULTURE  APTT  LACTIC ACID, PLASMA  BLOOD GAS, ARTERIAL  TSH  T4  MAGNESIUM    EKG EKG Interpretation  Date/Time:  Tuesday April 30 2021 18:24:17 EST Ventricular Rate:  124 PR Interval:  128 QRS Duration: 100 QT Interval:  326 QTC Calculation: 469 R Axis:   100 Text Interpretation: Sinus tachycardia Left atrial enlargement Inferior infarct, recent Lateral  leads are also involved since last tracing no significant change Confirmed by Daleen Bo [phone]) on 04/30/2021 8:59:03 PM  Radiology CT Head Wo Contrast  Result Date: 04/30/2021 CLINICAL DATA:  Mental status change, unknown cause EXAM: CT HEAD WITHOUT CONTRAST TECHNIQUE: Contiguous axial images were obtained from the base of the skull through the vertex without intravenous contrast. RADIATION DOSE REDUCTION: This exam was performed according to the departmental dose-optimization program which includes automated exposure control, adjustment of the mA and/or kV according to patient size and/or use of iterative reconstruction technique. COMPARISON:  04/25/2021 FINDINGS: Brain: There is atrophy and chronic small vessel disease changes. No acute intracranial abnormality. Specifically, no hemorrhage, hydrocephalus, mass lesion, acute infarction, or significant intracranial injury. Vascular: No hyperdense vessel or unexpected calcification. Skull: No acute calvarial abnormality. Sinuses/Orbits: No acute findings Other: None IMPRESSION: Atrophy, chronic microvascular disease. No acute intracranial abnormality. Electronically Signed   By: Rolm Baptise M.D.   On: 04/30/2021 22:16   DG Chest Port 1 View  Result Date: 04/30/2021 CLINICAL DATA:  Altered mental status/sepsis EXAM: PORTABLE CHEST 1 VIEW COMPARISON:  Chest x-ray 04/23/2021, chest x-ray 03/02/2017 FINDINGS: Enlarged cardiac silhouette. The heart and mediastinal contours are unchanged. Aortic calcification. No focal consolidation. No pulmonary edema. Small volume right pleural effusion. No pneumothorax. No acute osseous abnormality. Cardiac surgical hardware overlies the mediastinum. Possible right shoulder calcific tendinosis. IMPRESSION: 1. Small volume right pleural effusion. 2.  Aortic Atherosclerosis (ICD10-I70.0). Electronically Signed   By: Iven Finn M.D.   On: 04/30/2021 19:48    Procedures Procedures    Medications Ordered in  ED Medications  lactated ringers infusion ( Intravenous New Bag/Given 04/30/21 2131)  ceFEPIme (MAXIPIME) 2 g in sodium chloride 0.9 % 100 mL IVPB (0 g Intravenous Stopped 04/30/21 2208)  vancomycin (VANCOREADY) IVPB 1500 mg/300 mL (has no administration in time range)  potassium chloride 10 mEq in 100 mL IVPB (has no administration in time range)  metroNIDAZOLE (FLAGYL) IVPB 500 mg (0 mg Intravenous Stopped 04/30/21 2057)  lactated ringers bolus 1,000 mL (0 mLs Intravenous Stopped 04/30/21 2128)    And  lactated ringers bolus 1,000 mL (1,000 mLs Intravenous New Bag/Given 04/30/21 1922)    And  lactated ringers bolus 500 mL (500 mLs Intravenous New Bag/Given 04/30/21 2129)  vancomycin (VANCOREADY) IVPB 1750 mg/350 mL (0 mg Intravenous Stopped 04/30/21 2228)    ED Course/ Medical Decision Making/ A&P Clinical Course as of 04/30/21 2239  Tue Apr 30, 2021  2101 IV therapy has completed second line right forearm.  Currently has 2, 22-gauge IVs. [EW]    Clinical Course User Index [EW] Daleen Bo, MD                           Medical  Decision Making Patient arrives by EMS with signs and symptoms of sepsis, clinically dehydrated, clinical concern for DKA.  Amount and/or Complexity of Data Reviewed Independent Historian: caregiver    Details: Son with patient on arrival.  He describes gradual decreased activity, generalized pain, and a fall, 5 days ago.  He states she has been evaluated by her physician who found various types of arthritis but not clear which type is actually causing her condition. Labs: ordered.    Details: CBC, metabolic panel, lactate, urinalysis, PT, PTT, blood cultures, urine culture, arterial blood gas-findings normal except sodium high, potassium low, CO2 low, glucose high, creatinine high, total protein low, albumin low, GFR low, anion gap high, lactate high, hemoglobin high, PT/INR elevated, urinalysis abnormal consistent with UTI. Radiology: ordered and independent  interpretation performed.    Details: Chest x-ray-no indicator for acute pneumonia.  CT head ordered to evaluate for causes of encephalopathy-no acute intracranial abnormality. ECG/medicine tests: ordered.    Details: Cardiac monitor-sinus tachycardia Discussion of management or test interpretation with external provider(s): Consultation pulmonary critical care-Dr. Patsey Berthold will see and admit the patient.  Risk Prescription drug management. Risk Details: Patient presenting with sepsis sepsis.  No clear source of low-grade fever.  Sepsis bundle initiated including empiric antibiotic medication.  She was unable to give history.  Son at bedside on arrival.  Unclear history to cause her current illness.  Multiple comorbidities including diabetes, hypertension, coronary artery disease, acute on chronic respiratory failure gastroesophageal reflux.  Evaluation remarkable for significant dehydration, likely UTI, improvement with IV fluids and empiric treatment.  Patient apparently gradually worse for about a month, with no clear diagnosis so far.  She will require hospitalization.  Markedly elevated lactate, is likely multifactorial.  CT does not indicate intracranial bleeding or CVA.  She may need MRI brain, and neurology consultation.  Critical Care Total time providing critical care: 30-74 minutes          Final Clinical Impression(s) / ED Diagnoses Final diagnoses:  Encephalopathy  Hyponatremia  Urinary tract infection without hematuria, site unspecified  Hypokalemia    Rx / DC Orders ED Discharge Orders     None         Daleen Bo, MD 04/30/21 2239

## 2021-04-30 NOTE — Progress Notes (Signed)
Sepsis tracking by eLINK 

## 2021-04-30 NOTE — ED Notes (Signed)
IV team at pt bedside 

## 2021-04-30 NOTE — ED Notes (Signed)
Pt transported to CT by this RN.

## 2021-04-30 NOTE — ED Notes (Signed)
Wentz MD at bedside. 

## 2021-04-30 NOTE — ED Triage Notes (Signed)
Pt arrived to ED via EMS from home as AMS/Sepsis. Pt's family last spoke w/ pt on phone yesterday morning. Pt's family found pt in bed this afternoon covered in urine and altered. EMS reports pt intermittently responsive to pain and verbal stimuli. Pt was seen recently for PNA and for a fall. Pt initial BP 70/palp w/o radial pulses. EMS started IO in R tib and gave 217mL NS bolus. Last BP 80/40, HR 130's, capnography 23, O2 94% RA, CBG 151, RR 30. EMS noted elevation on EKG in inferior leads.

## 2021-04-30 NOTE — H&P (Addendum)
NAME:  [name] [name], MRN:  [id number], DOB:  [birth year], LOS: 0 ADMISSION DATE:  04/30/2021, CONSULTATION DATE:  04/30/21  REFERRING MD:  Eulis Foster,  CHIEF COMPLAINT:  acute encephalopathy   History of Present Illness:  [name] [name] is a 61 yo woman with PMH significant for DM on Metformin, RA with chronic pain, HTN, HL, CAD s/p CABG, L renal stone and UTI s/p cystoscopy and lithotripsy 06/2020 who was found by her son 2/14 incontinent of urine and altered.  He reports that he saw her 2/14 at her recent baseline.  EMS called, initially intermittently responsive to pain and voice.  Initially hypotensive, given 200cc NS by EMS.  Her son reports gradually worsening over the last couple of months with recent Wt loss, aching pain all over, 30lb wt loss, swallowing issues (food caught in throat sensation).  She fell last week and was seen in the ED 2/9 and discharged home.  He is not sure if she has been taking her medications correctly  In the ED BP improved, CTH negative, Found to be hypernatremic 152, elevated lactic acidosis 8.7 and Initially febrile to 100.9, ABG 7.389/35/67, UA with Ketones and protein, trace leuks and few bacteria.  Creatinine elevated to 1.9, recent baseline appears to be <1.   In ED: received 2.5L  LR, flagyl, vanc, cefepime.  When her lactic acid remained elevated at 8.3 and mental status did not improve PCCM consulted for admission.     Pertinent  Medical History  Generalized pain, arthritis.  DM HTN HLD CAD s/p CABG  Arthritis (RA?) Hx of stroke (?) Prednisone taper 03/2021 Anxiety depression  Takes flomax Ecoli uti from 04/02/21 RA  Significant Hospital Events: Including procedures, antibiotic start and stop dates in addition to other pertinent events   2/14 Presented to ED after being found altered by family, concern for UTI and sepsis, started on Vanc/Cefepime.      Interim History / Subjective:  Pt awake and groaning, able to answer questions intermittently,  denies pain.  Oriented to person.  Now hypertensive  Objective   Blood pressure (!) 173/99, pulse 92, temperature 98 F (36.7 C), temperature source Oral, resp. rate 12, height 5\' 6"  (1.676 m), weight 81.6 kg, SpO2 95 %.        Intake/Output Summary (Last 24 hours) at 04/30/2021 2231 Last data filed at 04/30/2021 2208 Gross per 24 hour  Intake 1300 ml  Output --  Net 1300 ml   Filed Weights   04/30/21 1832  Weight: 81.6 kg   General:  thin, chronically and acutely ill-appearing F in no acute distress HEENT: MM dry/sclera injected, severely dry lips with peeling skin, pupils equal Neuro: awake, oriented to person only, intermittently answering questions and following commands, other times staring and not responding, no obvious focal deficits, continuously groaning CV: s1s2 rrr, no m/r/g PULM:  no rhonchi or wheezing, no distress on 2L [place], mildly decreased air entry bilateral bases GI: soft, non-tender and non-distended, no CVA TTP Extremities: warm/dry, no edema  Skin: no rashes or lesions, dry skin diffusely    CT head;  IMPRESSION: Atrophy, chronic microvascular disease. No acute intracranial abnormality. CXR:  IMPRESSION: 1. Small volume right pleural effusion. 2.  Aortic Atherosclerosis (ICD10-I70.0).  Echo 12/18 Impressions:   - Compared to a prior study in 09/2015, the LVEF is lower at 45-50%    - global hypokinesis with inferior wall hypokinesis is noted.    Heart cath 09/2015 Prox LAD lesion, 85% stenosed. Patent LIMA  to LAD. Severe native 3 vessel CAD. Lat 1st Mrg-2 lesion, 100% stenosed. Occluded SVG to OM. Ost RCA to Mid RCA lesion, 70% stenosed. Ocluded SVG to RCA, Dist RCA lesion, 99% stenosed. Post intervention with a 2.25 x 12 Promus Premier, there is a 0% residual stenosis. Normal LVEDP.   Switch to Brilinta from Plavix.  She needs aggressive secondary prevention.  Patient with residual ST elevation in the inferior leads but her chest pain has improved  significantly.  Proximal RCA disease did not seem critical, compared to distal lesion.     Assessment & Plan:   Acute Encephalopathy, suspect Metabolic in the setting of volume depletion and electrolyte derangement with possible UTI/Sepsis. Recent dysphagia CTH negative Hx renal stones with lithotripsy 10 months ago -check ammonia and UDS, prescribed narcotics in the past, none recently in PDMP -continue broad spectrum abx for now, though suspect can de-escalate tomorrow.  Last UC + E. Coli Ceftriaxone sensitive -consider CT abdomen stone study if sings of worsening shock/sepsis -follow blood and urine cultures -received 30cc/kg IVF, continue LR 150cc/hr for now -NPO, speech eval -consider MRI if encephalopathy is not improving   Lactic acidosis  AGMA Hypokalemia Hypernatremia Consider Type B lactic acidosis from Metformin in the setting of dehydration vs sepsis son is unsure if she has been taking home medications correctly. -Trend Lactic acid and BMP -received 59meq K in the ED, replete as needed -check Mag -1.8L calculated free water deficit, received lactated ringers, if hypernatremia not improving may need D5water, goal correction no more than 30meq in 24hrs -serial BMP q6hrs     Non-oliguric Acute Kidney Injury Creatinine 1.9, recent baseline <1 -likely pre-renal in the setting of volume depletion -monitor UOP and renal indices after IVF -avoid nephrotoxins, if worsening in the setting of Metformin/lactic acidosis may need HD   History of CAD, HTN, HL -check troponin and echo -continue ASA -hold home Norvasc, statin, Imdur  T2DM -hold Metformin and Victoza, SSI  History RA -recent steroid taper, no DMARD    Best Practice (right click and "Reselect all SmartList Selections" daily)   Diet/type: NPO DVT prophylaxis: prophylactic heparin  GI prophylaxis: N/A Lines: N/A Foley:  Yes, and it is still needed Code Status:  full code Last date of  multidisciplinary goals of care discussion [Plan discussed with son at the bedside]  Labs   CBC: Recent Labs  Lab 04/25/21 1128 04/30/21 1834 04/30/21 2130  WBC 3.3* 9.7  --   NEUTROABS 2.3 8.4*  --   HGB 14.0 16.4* 13.3  HCT 45.0 53.1* 39.0  MCV 92.6 95.5  --   PLT 186 201  --     Basic Metabolic Panel: Recent Labs  Lab 04/25/21 1128 04/30/21 1834 04/30/21 2130  NA 143 154* 156*  K 3.5 3.2* 3.1*  CL 104 110  --   CO2 23 20*  --   GLUCOSE 93 123*  --   BUN 6* 18  --   CREATININE 0.91 1.94*  --   CALCIUM 9.4 9.6  --    GFR: Estimated Creatinine Clearance: 32.4 mL/min (A) (by C-G formula based on SCr of 1.94 mg/dL (H)). Recent Labs  Lab 04/25/21 1128 04/30/21 1834  WBC 3.3* 9.7  LATICACIDVEN  --  8.7*    Liver Function Tests: Recent Labs  Lab 04/30/21 1834  AST 35  ALT 15  ALKPHOS 49  BILITOT 1.2  PROT 8.2*  ALBUMIN 3.1*   No results for input(s): LIPASE, AMYLASE in the last  168 hours. No results for input(s): AMMONIA in the last 168 hours.  ABG    Component Value Date/Time   PHART 7.389 04/30/2021 2130   PCO2ART 35.0 04/30/2021 2130   PO2ART 67 (L) 04/30/2021 2130   HCO3 21.2 04/30/2021 2130   TCO2 22 04/30/2021 2130   ACIDBASEDEF 3.0 (H) 04/30/2021 2130   O2SAT 93 04/30/2021 2130     Coagulation Profile: Recent Labs  Lab 04/30/21 1834  INR 1.2    Cardiac Enzymes: No results for input(s): CKTOTAL, CKMB, CKMBINDEX, TROPONINI in the last 168 hours.  HbA1C: Hgb A1c MFr Bld  Date/Time Value Ref Range Status  04/06/2020 07:43 PM 7.4 (H) 4.8 - 5.6 % Final    Comment:    (NOTE) Pre diabetes:          5.7%-6.4%  Diabetes:              >6.4%  Glycemic control for   <7.0% adults with diabetes   12/12/2019 01:41 PM 7.0 (H) 4.8 - 5.6 % Final    Comment:    (NOTE) Pre diabetes:          5.7%-6.4%  Diabetes:              >6.4%  Glycemic control for   <7.0% adults with diabetes     CBG: Recent Labs  Lab 04/30/21 1825  GLUCAP  107*    Review of Systems:   Unable to obtain secondary to encephalopathy  Past Medical History:  She,  has a past medical history of Acute ST elevation myocardial infarction (STEMI) involving right coronary artery ([place]) (10/04/2015), Anxiety, Anxiety and depression, CAD (coronary artery disease), CAD (coronary artery disease) of artery bypass graft; occluded SVG to RCA and occluded SVG to OM (10/08/2015), Empty sella ([place]), GERD (gastroesophageal reflux disease), Headache, Hematuria (wed 06-13-2020), Hidradenitis axillaris, History of kidney stones, Hyperlipemia, Hypertension, Lupus ([place]) (dx'd 2008), Obesity (BMI 30.0-34.9), Pneumonia (X 2), PONV (postoperative nausea and vomiting), Psoriasis (06/15/2020), Rheumatoid aortitis, Rheumatoid arthritis ([place]), S/P angioplasty with stent; 10/04/15 to distal RCA lesion. with Promus premier. (10/08/2015), Scoliosis, Sleep apnea, Stroke [facility]) (March 2014), Tobacco abuse, Type II diabetes mellitus ([place]), Urinary frequency, and Wears glasses.   Surgical History:   Past Surgical History:  Procedure Laterality Date   ABDOMINAL HYSTERECTOMY  31 yrs ago   total has ovaies   BREAST BIOPSY Bilateral yrd ago   benign   CARDIAC CATHETERIZATION N/A 06/13/2015   Procedure: Left Heart Cath and Coronary Angiography;  Surgeon: Peter M Martinique, MD;  Location: [place] [facility];  Service: Cardiovascular;  Laterality: N/A;   CARDIAC CATHETERIZATION N/A 10/04/2015   Procedure: Left Heart Cath and Coronary Angiography;  Surgeon: [name], MD;  Location: [place] [facility];  Service: Cardiovascular;  Laterality: N/A;   CARDIAC CATHETERIZATION N/A 10/04/2015   Procedure: Coronary Stent Intervention;  Surgeon: [name], MD;  Location: [place] [facility];  Service: Cardiovascular;  Laterality: N/A;   CORONARY ARTERY BYPASS GRAFT N/A 06/19/2015   Procedure: CORONARY ARTERY BYPASS GRAFTING (CABG) TIMES FOUR USING LEFT INTERNAL MAMMARY, RIGHT SAPHENOUS LEG VEIN AND  CRYO SAPHENOUS VEIN;  Surgeon: [name], MD;  Location: [place];  Service: Open Heart Surgery;  Laterality: N/A;   CYSTOSCOPY WITH RETROGRADE PYELOGRAM, URETEROSCOPY AND STENT PLACEMENT Left 06/21/2020   Procedure: CYSTOSCOPY WITH RETROGRADE PYELOGRAM, URETEROSCOPY, BASKET STONE EXTRACTION AND  STENT PLACEMENT;  Surgeon: Robley Fries, MD;  Location: [facility];  Service: Urology;  Laterality: Left;   DILATION AND CURETTAGE OF UTERUS  yrs ago   HOLMIUM LASER APPLICATION Left 07/19/979   Procedure: HOLMIUM LASER APPLICATION;  Surgeon: Robley Fries, MD;  Location: [facility];  Service: Urology;  Laterality: Left;   HYDRADENITIS EXCISION Right 12/20/2019   Procedure: EXCISION OF RIGHT  HIDRADENITIS AXILLARY;  Surgeon: Donnie Mesa, MD;  Location: [facility];  Service: General;  Laterality: Right;   IR URETERAL STENT LEFT NEW ACCESS W/O SEP NEPHROSTOMY CATH  01/10/2019   NEPHROLITHOTOMY Left 01/10/2019   Procedure: NEPHROLITHOTOMY PERCUTANEOUS;  Surgeon: Kathie Rhodes, MD;  Location: [facility];  Service: Urology;  Laterality: Left;   TEE WITHOUT CARDIOVERSION N/A 06/19/2015   Procedure: TRANSESOPHAGEAL ECHOCARDIOGRAM (TEE);  Surgeon: [name], MD;  Location: [place];  Service: Open Heart Surgery;  Laterality: N/A;   TONSILLECTOMY  age 60   TUBAL LIGATION  many yrs ago     Social History:   reports that she quit smoking about 6 years ago. Her smoking use included cigarettes. She has a 18.00 pack-year smoking history. She has never used smokeless tobacco. She reports that she does not drink alcohol and does not use drugs.   Family History:  Her family history includes Alzheimer's disease in an other family member; Breast cancer in her mother and another family member; Depression in her son; Diabetes in her mother and another family member; Heart failure in her mother; Stroke in her maternal aunt.   Allergies Allergies  Allergen Reactions   Apremilast Other  (See Comments)     Home Medications  Prior to Admission medications   Medication Sig Start Date End Date Taking? Authorizing Provider  ACCU-CHEK GUIDE test strip  03/20/21   [provider]  Accu-Chek Softclix Lancets lancets     [provider]  acetaminophen (TYLENOL) 325 MG tablet Take 650 mg by mouth every 6 (six) hours as needed for moderate pain or headache.    [provider]  amLODipine (NORVASC) 5 MG tablet Take 1 tablet (5 mg total) by mouth daily. 04/26/20   Kroeger, Lorelee Cover., [name]  amoxicillin-clavulanate (AUGMENTIN) 875-125 MG tablet Take 1 tablet by mouth every 12 (twelve) hours. Patient not taking: Reported on 04/17/2021 04/03/21   Prosperi, Christian H, [name]  aspirin EC 81 MG EC tablet Take 1 tablet (81 mg total) by mouth daily. 06/25/15   Nani Skillern, [name]  atorvastatin (LIPITOR) 80 MG tablet TAKE 1 TABLET EVERY DAY  AT  6PM Patient taking differently: Take 80 mg by mouth every evening. 05/29/17   Martinique, Peter M, MD  azithromycin (ZITHROMAX) 250 MG tablet Take 1 tablet (250 mg total) by mouth daily. Take first 2 tablets together, then 1 every day until finished. Patient not taking: Reported on 04/17/2021 04/03/21   Prosperi, Christian H, [name]  Biotin w/ Vitamins C & E (HAIR/SKIN/NAILS PO) Take 1-2 tablets by mouth See admin instructions. Take 1 tablet in the morning & take 2 tablets in the evening.    [provider]  Blood Glucose Calibration (ACCU-CHEK AVIVA) SOLN     [provider]  cetirizine (ZYRTEC) 10 MG tablet Take 10 mg by mouth daily. 02/18/17   [provider]  cholecalciferol (VITAMIN D) 25 MCG (1000 UNIT) tablet Take 1,000 Units by mouth in the morning and at bedtime.    [provider]  COSENTYX SENSOREADY, 300 MG, 150 MG/ML SOAJ Inject into the skin. 03/14/21   [provider]  [place]  32G X 4 MM MISC  07/13/20   [provider]  escitalopram (LEXAPRO) 10 MG tablet Take  10 mg by mouth daily. 11/23/20   [provider]  Evolocumab with Infusor ([place]) 420 MG/3.5ML SOCT Inject 420 mg into the skin every 30 (thirty) days. 04/26/20   Kroeger, Lorelee Cover., [name]  famotidine (PEPCID) 20 MG tablet Take 20 mg by mouth at bedtime as needed for heartburn or indigestion.    [provider]  guaiFENesin (MUCINEX) 600 MG 12 hr tablet Take 600 mg by mouth 2 (two) times daily as needed (congestion.).    [provider]  HYDROcodone-acetaminophen (NORCO/VICODIN) 5-325 MG tablet Take 1-2 tablets by mouth every 6 (six) hours as needed. Patient taking differently: Take 1-2 tablets by mouth every 6 (six) hours as needed for moderate pain. 04/03/21   Prosperi, Christian H, [name]  isosorbide mononitrate (IMDUR) 30 MG 24 hr tablet Take 1 tablet (30 mg total) by mouth daily. 04/26/20   Kroeger, Lorelee Cover., [name]  liraglutide (VICTOZA) 18 MG/3ML SOPN Inject 1.8 mg into the skin at bedtime.     [provider]  metFORMIN (GLUCOPHAGE) 1000 MG tablet Take 1,000 mg by mouth 2 (two) times daily. 04/06/15   [provider]  metoprolol tartrate (LOPRESSOR) 25 MG tablet Take one and a half (1.5) tablets (37.5 mg) by mouth twice (2) daily. Patient taking differently: Take 37.5 mg by mouth 2 (two) times daily. 09/15/17   Martinique, Peter M, MD  Multiple Vitamin (MULTIVITAMIN WITH MINERALS) TABS tablet Take 1 tablet by mouth daily. Centrum Silver    [provider]  nitroGLYCERIN (NITROSTAT) 0.4 MG SL tablet DISSOLVE 1 TABLET UNDER THE TONGUE EVERY 5 MINUTES AS  NEEDED FOR CHEST PAIN. MAX  OF 3 TABLETS IN 15 MINUTES. CALL 911 IF PAIN PERSISTS. Patient taking differently: Place 0.4 mg under the tongue every 5 (five) minutes as needed for chest pain. 11/15/18   Martinique, Peter M, MD  ondansetron (ZOFRAN) 4 MG tablet Take 1 tablet (4 mg total) by mouth every 6 (six) hours. Patient taking differently: Take 4 mg by mouth every 8 (eight) hours as needed  for vomiting or nausea. 04/03/21   Prosperi, Christian H, [name]  potassium chloride SA (KLOR-CON M) 20 MEQ tablet Take 1 tablet (20 mEq total) by mouth 2 (two) times daily. 04/18/21   Maudie Flakes, MD  Potassium Citrate 15 MEQ (1620 MG) TBCR Take 15 mg by mouth in the morning and at bedtime. 10/05/19   [provider]  predniSONE (STERAPRED UNI-PAK 21 TAB) 10 MG (21) TBPK tablet Take by mouth daily. Take 6 tabs by mouth daily  for 2 days, then 5 tabs for 2 days, then 4 tabs for 2 days, then 3 tabs for 2 days, 2 tabs for 2 days, then 1 tab by mouth daily for 2 days Patient not taking: Reported on 04/17/2021 04/03/21   Prosperi, Christian H, [name]  sucralfate (CARAFATE) 1 g tablet Take 1 tablet (1 g total) by mouth 4 (four) times daily as needed. Patient taking differently: Take 1 g by mouth 4 (four) times daily as needed (stomach pain). 04/06/21   Maudie Flakes, MD  tamsulosin (FLOMAX) 0.4 MG CAPS capsule Take 1 capsule (0.4 mg total) by mouth daily after supper. Patient not taking: Reported on 04/06/2021 06/07/20   Larene Pickett, [name]     Critical care time:  50 minutes      CRITICAL CARE Performed by: Mickel Baas  R Rydge [place]   Total critical care time: 50 minutes  Critical care time was exclusive of separately billable procedures and treating other patients.  Critical care was necessary to treat or prevent imminent or life-threatening deterioration.  Critical care was time spent personally by me on the following activities: development of treatment plan with patient and/or surrogate as well as nursing, discussions with consultants, evaluation of patient's response to treatment, examination of patient, obtaining history from patient or surrogate, ordering and performing treatments and interventions, ordering and review of laboratory studies, ordering and review of radiographic studies, pulse oximetry and re-evaluation of patient's condition.   Otilio Carpen Deni Lefever, [name] [facility]  [facility] See Amion for pager If no response to pager , please call [phone] until 7pm After 7:00 pm call Elink  [phone]?[phone]

## 2021-04-30 NOTE — ED Notes (Signed)
ED provider made aware of lactic acid 8.7

## 2021-05-01 ENCOUNTER — Inpatient Hospital Stay (HOSPITAL_COMMUNITY): Payer: Medicare HMO

## 2021-05-01 DIAGNOSIS — D6959 Other secondary thrombocytopenia: Secondary | ICD-10-CM | POA: Diagnosis present

## 2021-05-01 DIAGNOSIS — E11649 Type 2 diabetes mellitus with hypoglycemia without coma: Secondary | ICD-10-CM | POA: Diagnosis not present

## 2021-05-01 DIAGNOSIS — I25810 Atherosclerosis of coronary artery bypass graft(s) without angina pectoris: Secondary | ICD-10-CM | POA: Diagnosis present

## 2021-05-01 DIAGNOSIS — L89322 Pressure ulcer of left buttock, stage 2: Secondary | ICD-10-CM | POA: Diagnosis present

## 2021-05-01 DIAGNOSIS — L8990 Pressure ulcer of unspecified site, unspecified stage: Secondary | ICD-10-CM | POA: Diagnosis not present

## 2021-05-01 DIAGNOSIS — E871 Hypo-osmolality and hyponatremia: Secondary | ICD-10-CM | POA: Diagnosis not present

## 2021-05-01 DIAGNOSIS — E876 Hypokalemia: Secondary | ICD-10-CM | POA: Diagnosis not present

## 2021-05-01 DIAGNOSIS — R4182 Altered mental status, unspecified: Secondary | ICD-10-CM | POA: Diagnosis present

## 2021-05-01 DIAGNOSIS — R6521 Severe sepsis with septic shock: Secondary | ICD-10-CM

## 2021-05-01 DIAGNOSIS — G9341 Metabolic encephalopathy: Secondary | ICD-10-CM | POA: Diagnosis not present

## 2021-05-01 DIAGNOSIS — I5030 Unspecified diastolic (congestive) heart failure: Secondary | ICD-10-CM | POA: Diagnosis not present

## 2021-05-01 DIAGNOSIS — L4050 Arthropathic psoriasis, unspecified: Secondary | ICD-10-CM | POA: Diagnosis present

## 2021-05-01 DIAGNOSIS — G9340 Encephalopathy, unspecified: Secondary | ICD-10-CM | POA: Diagnosis not present

## 2021-05-01 DIAGNOSIS — E870 Hyperosmolality and hypernatremia: Secondary | ICD-10-CM | POA: Insufficient documentation

## 2021-05-01 DIAGNOSIS — R532 Functional quadriplegia: Secondary | ICD-10-CM | POA: Diagnosis not present

## 2021-05-01 DIAGNOSIS — N179 Acute kidney failure, unspecified: Secondary | ICD-10-CM

## 2021-05-01 DIAGNOSIS — I5042 Chronic combined systolic (congestive) and diastolic (congestive) heart failure: Secondary | ICD-10-CM | POA: Diagnosis present

## 2021-05-01 DIAGNOSIS — N390 Urinary tract infection, site not specified: Secondary | ICD-10-CM | POA: Insufficient documentation

## 2021-05-01 DIAGNOSIS — R410 Disorientation, unspecified: Secondary | ICD-10-CM | POA: Diagnosis not present

## 2021-05-01 DIAGNOSIS — E440 Moderate protein-calorie malnutrition: Secondary | ICD-10-CM | POA: Diagnosis not present

## 2021-05-01 DIAGNOSIS — Z20822 Contact with and (suspected) exposure to covid-19: Secondary | ICD-10-CM | POA: Diagnosis not present

## 2021-05-01 DIAGNOSIS — I248 Other forms of acute ischemic heart disease: Secondary | ICD-10-CM | POA: Diagnosis not present

## 2021-05-01 DIAGNOSIS — E519 Thiamine deficiency, unspecified: Secondary | ICD-10-CM | POA: Diagnosis present

## 2021-05-01 DIAGNOSIS — E872 Acidosis, unspecified: Secondary | ICD-10-CM | POA: Diagnosis not present

## 2021-05-01 DIAGNOSIS — A419 Sepsis, unspecified organism: Principal | ICD-10-CM

## 2021-05-01 DIAGNOSIS — N170 Acute kidney failure with tubular necrosis: Secondary | ICD-10-CM

## 2021-05-01 DIAGNOSIS — F32A Depression, unspecified: Secondary | ICD-10-CM | POA: Diagnosis present

## 2021-05-01 DIAGNOSIS — J869 Pyothorax without fistula: Secondary | ICD-10-CM

## 2021-05-01 DIAGNOSIS — L89312 Pressure ulcer of right buttock, stage 2: Secondary | ICD-10-CM | POA: Diagnosis not present

## 2021-05-01 DIAGNOSIS — E042 Nontoxic multinodular goiter: Secondary | ICD-10-CM | POA: Diagnosis present

## 2021-05-01 DIAGNOSIS — Z23 Encounter for immunization: Secondary | ICD-10-CM | POA: Diagnosis not present

## 2021-05-01 DIAGNOSIS — I110 Hypertensive heart disease with heart failure: Secondary | ICD-10-CM | POA: Diagnosis present

## 2021-05-01 LAB — CBC
HCT: 44.3 % (ref 36.0–46.0)
HCT: 46.3 % — ABNORMAL HIGH (ref 36.0–46.0)
Hemoglobin: 13.3 g/dL (ref 12.0–15.0)
Hemoglobin: 14.1 g/dL (ref 12.0–15.0)
MCH: 29 pg (ref 26.0–34.0)
MCH: 28.8 pg (ref 26.0–34.0)
MCHC: 30 g/dL (ref 30.0–36.0)
MCHC: 30.5 g/dL (ref 30.0–36.0)
MCV: 96.7 fL (ref 80.0–100.0)
MCV: 94.7 fL (ref 80.0–100.0)
Platelets: 148 10*3/uL — ABNORMAL LOW (ref 150–400)
Platelets: 128 10*3/uL — ABNORMAL LOW (ref 150–400)
RBC: 4.58 MIL/uL (ref 3.87–5.11)
RBC: 4.89 MIL/uL (ref 3.87–5.11)
RDW: 17.2 % — ABNORMAL HIGH (ref 11.5–15.5)
RDW: 17.2 % — ABNORMAL HIGH (ref 11.5–15.5)
WBC: 10.8 10*3/uL — ABNORMAL HIGH (ref 4.0–10.5)
WBC: 10.9 10*3/uL — ABNORMAL HIGH (ref 4.0–10.5)
nRBC: 0.5 % — ABNORMAL HIGH (ref 0.0–0.2)
nRBC: 0.5 % — ABNORMAL HIGH (ref 0.0–0.2)

## 2021-05-01 LAB — ECHOCARDIOGRAM COMPLETE
AR max vel: 3.1 cm2
AV Area VTI: 3.28 cm2
AV Area mean vel: 3.13 cm2
AV Mean grad: 5 mmHg
AV Peak grad: 8.9 mmHg
Ao pk vel: 1.5 m/s
Area-P 1/2: 4.49 cm2
Calc EF: 21 %
Height: 66 in
MV VTI: 2 cm2
S' Lateral: 2.8 cm
Single Plane A2C EF: 3.3 %
Single Plane A4C EF: 36.3 %
Weight: 2744.29 oz

## 2021-05-01 LAB — BODY FLUID CELL COUNT WITH DIFFERENTIAL
Eos, Fluid: 0 %
Lymphs, Fluid: 13 %
Monocyte-Macrophage-Serous Fluid: 71 % (ref 50–90)
Neutrophil Count, Fluid: 16 % (ref 0–25)
Total Nucleated Cell Count, Fluid: 1700 cu mm — ABNORMAL HIGH (ref 0–1000)

## 2021-05-01 LAB — TROPONIN I (HIGH SENSITIVITY)
Troponin I (High Sensitivity): 168 ng/L (ref <18)
Troponin I (High Sensitivity): 182 ng/L (ref <18)
Troponin I (High Sensitivity): 166 ng/L (ref <18)

## 2021-05-01 LAB — BASIC METABOLIC PANEL
Anion gap: 18 — ABNORMAL HIGH (ref 5–15)
Anion gap: 17 — ABNORMAL HIGH (ref 5–15)
BUN: 11 mg/dL (ref 8–23)
BUN: 12 mg/dL (ref 8–23)
CO2: 20 mmol/L — ABNORMAL LOW (ref 22–32)
CO2: 20 mmol/L — ABNORMAL LOW (ref 22–32)
Calcium: 8.9 mg/dL (ref 8.9–10.3)
Calcium: 8.6 mg/dL — ABNORMAL LOW (ref 8.9–10.3)
Chloride: 114 mmol/L — ABNORMAL HIGH (ref 98–111)
Chloride: 113 mmol/L — ABNORMAL HIGH (ref 98–111)
Creatinine, Ser: 1.24 mg/dL — ABNORMAL HIGH (ref 0.44–1.00)
Creatinine, Ser: 1.02 mg/dL — ABNORMAL HIGH (ref 0.44–1.00)
GFR, Estimated: 49 mL/min — ABNORMAL LOW (ref >60)
GFR, Estimated: >60 mL/min (ref >60)
Glucose, Bld: 96 mg/dL (ref 70–99)
Glucose, Bld: 97 mg/dL (ref 70–99)
Potassium: 3.5 mmol/L (ref 3.5–5.1)
Potassium: 3.7 mmol/L (ref 3.5–5.1)
Sodium: 152 mmol/L — ABNORMAL HIGH (ref 135–145)
Sodium: 150 mmol/L — ABNORMAL HIGH (ref 135–145)

## 2021-05-01 LAB — RAPID URINE DRUG SCREEN, HOSP PERFORMED
Amphetamines: NOT DETECTED
Barbiturates: NOT DETECTED
Benzodiazepines: NOT DETECTED
Cocaine: NOT DETECTED
Opiates: NOT DETECTED
Tetrahydrocannabinol: NOT DETECTED

## 2021-05-01 LAB — HIV ANTIBODY (ROUTINE TESTING W REFLEX): HIV Screen 4th Generation wRfx: NONREACTIVE

## 2021-05-01 LAB — GLUCOSE, CAPILLARY
Glucose-Capillary: 95 mg/dL (ref 70–99)
Glucose-Capillary: 89 mg/dL (ref 70–99)
Glucose-Capillary: 95 mg/dL (ref 70–99)
Glucose-Capillary: 71 mg/dL (ref 70–99)
Glucose-Capillary: 90 mg/dL (ref 70–99)
Glucose-Capillary: 118 mg/dL — ABNORMAL HIGH (ref 70–99)

## 2021-05-01 LAB — TSH: TSH: 0.092 u[IU]/mL — ABNORMAL LOW (ref 0.350–4.500)

## 2021-05-01 LAB — PROTEIN, PLEURAL OR PERITONEAL FLUID: Total protein, fluid: >12 g/dL

## 2021-05-01 LAB — MAGNESIUM
Magnesium: 1.6 mg/dL — ABNORMAL LOW (ref 1.7–2.4)
Magnesium: 1.6 mg/dL — ABNORMAL LOW (ref 1.7–2.4)

## 2021-05-01 LAB — LACTATE DEHYDROGENASE, PLEURAL OR PERITONEAL FLUID: LD, Fluid: 2497 U/L — ABNORMAL HIGH (ref 3–23)

## 2021-05-01 LAB — LACTIC ACID, PLASMA
Lactic Acid, Venous: 6.5 mmol/L (ref 0.5–1.9)
Lactic Acid, Venous: 7 mmol/L (ref 0.5–1.9)
Lactic Acid, Venous: 5.9 mmol/L (ref 0.5–1.9)

## 2021-05-01 LAB — HEMOGLOBIN A1C
Hgb A1c MFr Bld: 6.7 % — ABNORMAL HIGH (ref 4.8–5.6)
Mean Plasma Glucose: 145.59 mg/dL

## 2021-05-01 LAB — CREATININE, SERUM
Creatinine, Ser: 1.35 mg/dL — ABNORMAL HIGH (ref 0.44–1.00)
GFR, Estimated: 44 mL/min — ABNORMAL LOW (ref >60)

## 2021-05-01 LAB — CBG MONITORING, ED: Glucose-Capillary: 112 mg/dL — ABNORMAL HIGH (ref 70–99)

## 2021-05-01 LAB — AMMONIA: Ammonia: 38 umol/L — ABNORMAL HIGH (ref 9–35)

## 2021-05-01 LAB — LACTATE DEHYDROGENASE: LDH: 1154 U/L — ABNORMAL HIGH (ref 98–192)

## 2021-05-01 LAB — PHOSPHORUS: Phosphorus: 2.8 mg/dL (ref 2.5–4.6)

## 2021-05-01 LAB — T4, FREE: Free T4: 0.62 ng/dL (ref 0.61–1.12)

## 2021-05-01 LAB — MRSA NEXT GEN BY PCR, NASAL: MRSA by PCR Next Gen: NOT DETECTED

## 2021-05-01 LAB — PROTEIN, TOTAL: Total Protein: 6.7 g/dL (ref 6.5–8.1)

## 2021-05-01 MED ORDER — SODIUM CHLORIDE 0.45 % IV SOLN
INTRAVENOUS | Status: DC
  Administered 2021-05-02: 100 mL/h via INTRAVENOUS

## 2021-05-01 MED ORDER — CHLORHEXIDINE GLUCONATE CLOTH 2 % EX PADS
6.0000 | MEDICATED_PAD | Freq: Every day | CUTANEOUS | Status: DC
  Administered 2021-05-01 – 2021-05-07 (×7): 6 via TOPICAL

## 2021-05-01 MED ORDER — FENTANYL CITRATE (PF) 100 MCG/2ML IJ SOLN: 12.5000 ug | Freq: Once | INTRAMUSCULAR | Status: DC | PRN

## 2021-05-01 MED ORDER — INSULIN ASPART 100 UNIT/ML IJ SOLN
0.0000 [IU] | INTRAMUSCULAR | Status: DC
  Administered 2021-05-02: 3 [IU] via SUBCUTANEOUS
  Administered 2021-05-02: 2 [IU] via SUBCUTANEOUS
  Administered 2021-05-02: 3 [IU] via SUBCUTANEOUS
  Administered 2021-05-02 (×2): 2 [IU] via SUBCUTANEOUS
  Administered 2021-05-03 (×2): 3 [IU] via SUBCUTANEOUS
  Administered 2021-05-03: 2 [IU] via SUBCUTANEOUS
  Administered 2021-05-03 – 2021-05-04 (×3): 5 [IU] via SUBCUTANEOUS
  Administered 2021-05-04 (×2): 3 [IU] via SUBCUTANEOUS
  Administered 2021-05-04 (×2): 2 [IU] via SUBCUTANEOUS
  Administered 2021-05-05: 3 [IU] via SUBCUTANEOUS
  Administered 2021-05-05: 2 [IU] via SUBCUTANEOUS
  Administered 2021-05-05: 3 [IU] via SUBCUTANEOUS

## 2021-05-01 MED ORDER — VITAL HIGH PROTEIN PO LIQD: 1000.0000 mL | ORAL | Status: DC

## 2021-05-01 MED ORDER — JEVITY 1.2 CAL PO LIQD
1000.0000 mL | ORAL | Status: DC
  Administered 2021-05-01 – 2021-05-05 (×3): 1000 mL
  Filled 2021-05-01 (×9): qty 1000

## 2021-05-01 MED ORDER — SODIUM CHLORIDE 0.9 % IV SOLN: INTRAVENOUS | Status: DC | PRN

## 2021-05-01 MED ORDER — POLYVINYL ALCOHOL 1.4 % OP SOLN
1.0000 [drp] | OPHTHALMIC | Status: DC | PRN
  Administered 2021-05-01: 1 [drp] via OPHTHALMIC
  Filled 2021-05-01: qty 15

## 2021-05-01 MED ORDER — SODIUM CHLORIDE 0.9 % IV SOLN: 2.0000 g | Freq: Two times a day (BID) | INTRAVENOUS | Status: DC

## 2021-05-01 MED ORDER — PROSOURCE TF PO LIQD: 45.0000 mL | Freq: Two times a day (BID) | ORAL | Status: DC

## 2021-05-01 MED ORDER — PERFLUTREN LIPID MICROSPHERE
1.0000 mL | INTRAVENOUS | Status: AC | PRN
  Administered 2021-05-01: 2 mL via INTRAVENOUS
  Filled 2021-05-01: qty 10

## 2021-05-01 MED ORDER — PROSOURCE TF PO LIQD
45.0000 mL | Freq: Two times a day (BID) | ORAL | Status: DC
  Administered 2021-05-01 – 2021-05-06 (×11): 45 mL
  Filled 2021-05-01 (×11): qty 45

## 2021-05-01 MED ORDER — PIPERACILLIN-TAZOBACTAM 3.375 G IVPB
3.3750 g | Freq: Three times a day (TID) | INTRAVENOUS | Status: DC
  Administered 2021-05-01 – 2021-05-02 (×3): 3.375 g via INTRAVENOUS
  Filled 2021-05-01 (×4): qty 50

## 2021-05-01 MED ORDER — ASPIRIN EC 81 MG PO TBEC
81.0000 mg | DELAYED_RELEASE_TABLET | Freq: Every day | ORAL | Status: DC
  Administered 2021-05-01: 81 mg via ORAL
  Filled 2021-05-01 (×2): qty 1

## 2021-05-01 MED ORDER — MAGNESIUM SULFATE 4 GM/100ML IV SOLN
4.0000 g | Freq: Once | INTRAVENOUS | Status: AC
  Administered 2021-05-01: 4 g via INTRAVENOUS
  Filled 2021-05-01: qty 100

## 2021-05-01 MED ORDER — ORAL CARE MOUTH RINSE
15.0000 mL | Freq: Two times a day (BID) | OROMUCOSAL | Status: DC
  Administered 2021-05-01 – 2021-05-09 (×17): 15 mL via OROMUCOSAL

## 2021-05-01 MED ORDER — CHLORHEXIDINE GLUCONATE 0.12 % MT SOLN
15.0000 mL | Freq: Two times a day (BID) | OROMUCOSAL | Status: DC
  Administered 2021-05-01 – 2021-05-09 (×15): 15 mL via OROMUCOSAL
  Filled 2021-05-01 (×14): qty 15

## 2021-05-01 MED ORDER — POLYETHYLENE GLYCOL 3350 17 G PO PACK: 17.0000 g | PACK | Freq: Every day | ORAL | Status: DC | PRN

## 2021-05-01 MED ORDER — HEPARIN SODIUM (PORCINE) 5000 UNIT/ML IJ SOLN
5000.0000 [IU] | Freq: Three times a day (TID) | INTRAMUSCULAR | Status: DC
  Administered 2021-05-01 – 2021-05-03 (×5): 5000 [IU] via SUBCUTANEOUS
  Filled 2021-05-01 (×6): qty 1

## 2021-05-01 MED ORDER — ATORVASTATIN CALCIUM 40 MG PO TABS
40.0000 mg | ORAL_TABLET | Freq: Every day | ORAL | Status: DC
  Administered 2021-05-01 – 2021-05-09 (×9): 40 mg
  Filled 2021-05-01 (×10): qty 1

## 2021-05-01 MED ORDER — POTASSIUM CHLORIDE 10 MEQ/100ML IV SOLN
10.0000 meq | INTRAVENOUS | Status: AC
  Administered 2021-05-01 (×4): 10 meq via INTRAVENOUS
  Filled 2021-05-01 (×4): qty 100

## 2021-05-01 MED ORDER — DOCUSATE SODIUM 100 MG PO CAPS: 100.0000 mg | ORAL_CAPSULE | Freq: Two times a day (BID) | ORAL | Status: DC | PRN

## 2021-05-01 MED ORDER — DEXTROSE 5 % IV SOLN: INTRAVENOUS | Status: DC

## 2021-05-01 NOTE — Procedures (Signed)
Thoracentesis  Procedure Note  AYLENE ACOFF  [id number]  03-15-1961  Date:05/01/21  Time:4:17 PM   Provider Performing:Jeffrey Court Joy   Procedure: Thoracentesis with imaging guidance (48889)  Indication(s) Pleural Effusion  Consent Risks of the procedure as well as the alternatives and risks of each were explained to the patient and/or caregiver.  Consent for the procedure was obtained and is signed in the bedside chart  Anesthesia Topical only with 1% lidocaine    Time Out Verified patient identification, verified procedure, site/side was marked, verified correct patient position, special equipment/implants available, medications/allergies/relevant history reviewed, required imaging and test results available.   Sterile Technique Maximal sterile technique including full sterile barrier drape, hand hygiene, sterile gown, sterile gloves, mask, hair covering, sterile ultrasound probe cover (if used).  Procedure Description Ultrasound was not used to identify appropriate pleural anatomy for placement and overlying skin marked.  Area of drainage cleaned and draped in sterile fashion. Lidocaine was used to anesthetize the skin and subcutaneous tissue.  315 cc's of purelent appearing fluid was drained from the right pleural space. Catheter then removed and bandaid applied to site.   Complications/Tolerance None; patient tolerated the procedure well. Chest X-ray is ordered to confirm no post-procedural complication.   EBL Minimal   Specimen(s) Pleural fluid

## 2021-05-01 NOTE — ED Notes (Signed)
Recliner provided to pt's visitor at bedside

## 2021-05-01 NOTE — Progress Notes (Addendum)
Initial Nutrition Assessment  DOCUMENTATION CODES:   Non-severe (moderate) malnutrition in context of chronic illness  INTERVENTION:   Initiate tube feeding via Cortrak: Jevity 1.2 at 25 ml/h, increase by 10 ml every 8 hours to goal rate of 65 ml/h (1560 ml per day) Prosource TF 45 ml BID  Provides 1952 kcal, 109 gm protein, 1264 ml free water daily.  Monitor magnesium, potassium, and phosphorus BID for at least 3 days, MD to replete as needed, as pt is at risk for refeeding syndrome given protein calorie malnutrition with severe weight loss.  When IVF discontinued, recommend free water flushes 200 ml QID for a total of 2064 ml free water daily.  NUTRITION DIAGNOSIS:   Moderate Malnutrition related to chronic illness, dysphagia as evidenced by moderate muscle depletion, moderate fat depletion, percent weight loss (15% weight loss within 6 months).  GOAL:   Patient will meet greater than or equal to 90% of their needs  MONITOR:   TF tolerance, Skin, Labs, Diet advancement  REASON FOR ASSESSMENT:   Consult Enteral/tube feeding initiation and management  ASSESSMENT:   61 yo female admitted with acute encephalopathy after being found incontinent of urine and altered by her son. PMH includes DM, RA, HTN, HLD, CAD, L renal stone.  Discussed patient in ICU rounds and with RN today.  Patient unable to provide any nutrition history during RD visit.  Per MD notes, patient has had swallowing issues over the past couple months and has lost 30 lbs recently. Per review of usual weights, patient has had 15% weight loss within the past 6 months.  S/P bedside swallow evaluation with SLP today. Recommendation to continue NPO status and place Cortrak tube for nutrition.  Cortrak tube was placed today, tip is in the stomach.    Labs reviewed. Na 150 (H), mag 1.6 (L), K 3.1 (L on adm) up to 3.7 today. CBG: [phone]  Medications reviewed and include Novolog. IVF: 1/2 NS at 100  ml/h.  Patient meets criteria for moderate malnutrition with moderate depletion of muscle and subcutaneous fat mass.   NUTRITION - FOCUSED PHYSICAL EXAM:  Flowsheet Row Most Recent Value  Orbital Region Moderate depletion  Upper Arm Region Moderate depletion  Thoracic and Lumbar Region Moderate depletion  Buccal Region Moderate depletion  Temple Region Moderate depletion  Clavicle Bone Region Moderate depletion  Clavicle and Acromion Bone Region Moderate depletion  Scapular Bone Region Unable to assess  Dorsal Hand Mild depletion  Patellar Region Mild depletion  Anterior Thigh Region Mild depletion  Posterior Calf Region Moderate depletion  Edema (RD Assessment) Mild  Hair Reviewed  Eyes Reviewed  Mouth Reviewed  Skin Reviewed  Nails Reviewed       Diet Order:   Diet Order             Diet NPO time specified Except for: Ice Chips  Diet effective now                   EDUCATION NEEDS:   No education needs have been identified at this time  Skin:  Skin Assessment: Skin Integrity Issues: Skin Integrity Issues:: Stage I Stage I: sacrum  Last BM:  no BM documented  Height:   Ht Readings from Last 1 Encounters:  04/30/21 5\' 6"  (1.676 m)    Weight:   Wt Readings from Last 1 Encounters:  05/01/21 77.8 kg    BMI:  Body mass index is 27.68 kg/m.  Estimated Nutritional Needs:   Kcal:  1900-2100  Protein:  100-115 gm  Fluid:  >/= 1.9 L    Lucas Mallow RD, LDN, CNSC Please refer to Amion for contact information.

## 2021-05-01 NOTE — Progress Notes (Signed)
Notified provider of need to order repeat lactic acid. ° °

## 2021-05-01 NOTE — Procedures (Signed)
Cortrak  Person Inserting Tube:  Alroy Dust, Tiphany Fayson L, RD Tube Type:  Cortrak - 43 inches Tube Size:  10 Tube Location:  Right nare Initial Placement:  Stomach Secured by: Bridle Technique Used to Measure Tube Placement:  Marking at nare/corner of mouth Cortrak Secured At:  69 cm  Cortrak Tube Team Note:  Consult received to place a Cortrak feeding tube.   X-ray is required, abdominal x-ray has been ordered by the Cortrak team. Please confirm tube placement before using the Cortrak tube.   If the tube becomes dislodged please keep the tube and contact the Cortrak team at [email or web address] (password [email or web address]) for replacement.  If after hours and replacement cannot be delayed, place a NG tube and confirm placement with an abdominal x-ray.    Roxana Hires, RD, LDN Clinical Dietitian See [facility] for contact information.

## 2021-05-01 NOTE — Progress Notes (Addendum)
NAME:  [name] [name], MRN:  [id number], DOB:  [birth year], LOS: 0 ADMISSION DATE:  04/30/2021, CONSULTATION DATE:  04/30/21 REFERRING MD:  Eulis Foster CHIEF COMPLAINT:  Acute Encephelopathy   History of Present Illness:  [name] [name] is a 61 yo woman with PMH significant for DM on Metformin, RA with chronic pain, HTN, HL, CAD s/p CABG, L renal stone and UTI s/p cystoscopy and lithotripsy 06/2020 who was found by her son 2/14 incontinent of urine and altered.  He reports that he saw her 2/14 at her recent baseline.  EMS called, initially intermittently responsive to pain and voice.  Initially hypotensive, given 200cc NS by EMS.  Her son reports gradually worsening over the last couple of months with recent Wt loss, aching pain all over, 30lb wt loss, swallowing issues (food caught in throat sensation).  She fell last week and was seen in the ED 2/9 and discharged home.  He is not sure if she has been taking her medications correctly   In the ED BP improved, CTH negative, Found to be hypernatremic 152, elevated lactic acidosis 8.7 and Initially febrile to 100.9, ABG 7.389/35/67, UA with Ketones and protein, trace leuks and few bacteria.  Creatinine elevated to 1.9, recent baseline appears to be <1.   In ED: received 2.5L  LR, flagyl, vanc, cefepime.  When her lactic acid remained elevated at 8.3 and mental status did not improve PCCM consulted for admission.   Pertinent  Medical History  Generalized pain, arthritis.  DM HTN HLD CAD s/p CABG  Arthritis (RA?) Hx of stroke (?) Prednisone taper 03/2021 Anxiety depression  Takes flomax Ecoli uti from 04/02/21 RA  Significant Hospital Events: Including procedures, antibiotic start and stop dates in addition to other pertinent events   2/14 Presented to ED after being found altered by family, concern for UTI and sepsis, started on Vanc/Cefepime.   Interim History / Subjective:    Objective   Blood pressure (!) 149/85, pulse 80, temperature 98.9 F  (37.2 C), temperature source Oral, resp. rate 16, height 5\' 6"  (1.676 m), weight 77.8 kg, SpO2 96 %.        Intake/Output Summary (Last 24 hours) at 05/01/2021 6073 Last data filed at 05/01/2021 0600 Gross per 24 hour  Intake 4264.28 ml  Output 225 ml  Net 4039.28 ml   Filed Weights   04/30/21 1832 05/01/21 0400  Weight: 81.6 kg 77.8 kg    Examination: General: Chronically ill appearing female , temporal wasting HE: Normocephalic, atraumatic  ENT: Dry mucous membranes , peeling skin on lips, pupals equal  Cardiovascular: Normal rate, regular rhythm.  No murmurs Pulmonary : Effort normal, breath sounds normal. No wheezes, rales, or rhonchi Abdominal: soft, nontender,  bowel sounds present Musculoskeletal: Trace pedal edema, no swollen or hot joints Skin: Warm, dry , no rash Neuro: wakes up to voice, she knows her name, doesn't focus to interviewer, turns head and goes back to sleep without answering further orientation questions.     WBC 10.8 > 10.9  Cr 1.94 >1.35>1.24 Sodium 156 >152 CO2 20 > 20 Potassium 3.5  Mg 1.6, Phos 2.8  Lactic acid 8.3 > 6.5 Ammonia 38  Resolved Hospital Problem list     Assessment & Plan:  Acute encephalopathy in setting of hypernatremia and lactic acidosis  - multifactorial ,metabolic encephalopathy from hypernatremia in setting of decreased oral intake by report. Also septic encephalopathy expected so patient has been started on broad spectrum antibiotics, vancomycin and cefepime.  -Blood cultures  are negative at 12 hours.  Urine culture pending. Right plural effusion on xray Plan: - Consult core track team - Follow blood cultures and urine cultures - Stop D5W, Continue 1/2NS @100  ml/hr - Stop Vancomycin - Continue Cefepime  - Afternoon BMP  AKI - baseline Cr .8 to 1 - Improving with IV hydration - Continue to trend kidney function and monitor I/O's - avoid nephrotoxins   Elevated Troponin  CAD s/p CABG - Hx of severe 3 vessel  CAD s/p CABG 2017 - expect this is demand in setting of sepsis, trend HS-troponin - Follow up Echo - ASA - ECG  Hypomagnesia Hypokalemia - Mg 1.6, K 3.5  - replete   Thrombocytopenia - Platelets 128 in setting of sepsis - Trend   Psoriatic Arthritis - Last notes I can see are 06/2020, followed at [facility] then.  - Cosentyx and Repatha on med list - will try to obtain recent records from Rheumatology.   Best Practice (right click and "Reselect all SmartList Selections" daily)   Diet/type: clear liquids DVT prophylaxis: prophylactic heparin  GI prophylaxis: N/A Lines: N/A Foley:  Yes, and it is still needed Code Status:  full code Last date of multidisciplinary goals of care discussion [x]     Attending attestation: [name] [name] is a 61 year old woman with a history of recent weight loss, diabetes on metformin, RA, hypertension, coronary artery disease status post CABG, nephrolithiasis with history of lithotripsy in 06/2020 who was brought in after urinary incontinence and being confused.  She was hypotensive and febrile upon arrival to the ED.  She was empirically started on ceftriaxone and given fluids for sepsis.  She required vasopressors to be started.  In the last few months she has had 30 pound weight loss, issues with swallowing with sensation of throat.  Today she denies complaints.  BP 133/86    Pulse 95    Temp 97.9 F (36.6 C) (Axillary)    Resp (!) 21    Ht 5\' 6"  (1.676 m)    Wt 77.8 kg    SpO2 98%    BMI 27.68 kg/m  Ill-appearing woman lying in bed no acute distress Awake, alert, minimally verbal.  Tracks.  Able to say her name, but sluggish with responses.  Not answering other questions.  Moving all extremities but weak globally. [place]/AT, very dry oral mucosa S1-S2, minimally tachycardic, regular rhythm Breathing comfortably on nasal cannula, CTAB Abdomen soft, nontender, nondistended Lower extremity edema present, no cyanosis No diffuse rashes or  ecchymosis  Blood culture-1 site-no growth to date Urine culture pending UA hematuria, no pyuria  LA 7> 5.9 Trop 168> 182> 166 Sodium 150 BUN 12 Creatinine 1.02 Anion gap 17  Assessment and plan: Septic shock, delayed lactate clearance possibly attributable to metformin.  Source potentially UTI, no evidence of pneumonia.  Chronic pleural effusion for at least the last month on right side for at least a month and no concerning features on ultrasound. -Agree with broad antibiotics - Follow blood cultures and urine cultures - Right-sided pleural effusion not concerning on ultrasound; still planning to tap this afternoon to ensure that this is not a potential source. -Ongoing volume resuscitation - Serial lactates  Lactic acidosis-suspect this is related to sepsis, potentially metformin with AKI causing delayed clearance - Continue volume resuscitation - Continue monitoring lactic acid - Continue holding metformin  Diabetes, A1c 6.7 -Sliding scale insulin as needed - Hold PTA diabetes meds  Acute metabolic encephalopathy due to sepsis.  Unclear what her  baseline mental status is. - N.p.o. until able to pass a swallow study - Hold potentially sedating medications - Supportive care  AKI, improving - Renally dose meds, avoid nephrotoxic meds - Strict I/os -Monitor  Hypernatremia - Ongoing volume resuscitation.  Reducing to half-normal saline to continue volume expansion but limit sodium load. - Recheck BMP this afternoon  CAD s/p CABG; EKG with deeper Q waves in II, III, aVF, but no ischemic T wave or ST segment changes. Troponin leak, troponins have mostly stayed flat-likely due to sepsis - Monitor on telemetry -Maintain optimal electrolytes - Aspirin and statin  Severe protein energy malnutrition, recent 30 pounds weight loss - Core track, tube feeds  Dysphagia, possibly odynophagia -will eventually need esophagram or evaluation by GI when cleared from swallowing  perspective by SLP   This patient is critically ill with multiple organ system failure which requires frequent high complexity decision making, assessment, support, evaluation, and titration of therapies. This was completed through the application of advanced monitoring technologies and extensive interpretation of multiple databases. During this encounter critical care time was devoted to patient care services described in this note for 42 minutes, separate from time spent teaching.  Julian Hy, DO 05/01/21 1:36 PM [facility]

## 2021-05-01 NOTE — Progress Notes (Addendum)
Pharmacy Antibiotic Note  [name] [name] is a 61 y.o. female admitted on 04/30/2021 with infection of unknown source , possible UTI.  S/p thoracentesis and Pharmacy has been consulted to narrow antibiotics to Zosyn monotherapy for empyema.  Renal function relatively stable, now afebrile, WBC 10.9.  Plan: Zosyn EID 3.375gm IV Q8H Monitor renal fxn, micro data  Height: 5\' 6"  (167.6 cm) Weight: 77.8 kg (171 lb 8.3 oz) IBW/kg (Calculated) : 59.3  Temp (24hrs), Avg:98.6 F (37 C), Min:97.9 F (36.6 C), Max:100.9 F (38.3 C)  Recent Labs  Lab 04/25/21 1128 04/30/21 1834 04/30/21 2158 05/01/21 0019 05/01/21 0619 05/01/21 0838 05/01/21 1211  WBC 3.3* 9.7  --  10.8* 10.9*  --   --   CREATININE 0.91 1.94*  --  1.35* 1.24*  --  1.02*  LATICACIDVEN  --  8.7* 8.3*  --  6.5* 7.0* 5.9*     Estimated Creatinine Clearance: 60.2 mL/min (A) (by C-G formula based on SCr of 1.02 mg/dL (H)).    Allergies  Allergen Reactions   Apremilast Other (See Comments)    Vanc 2/14 >> 2/15 Cefepime 2/14 >> 2/15 Metro 2/14 x1 dose Zosyn 2/15 >>  2/14 Bcx: 2/14 Urine cx:  Cieanna Stormes D. Mina Marble, PharmD, BCPS, [place] 05/01/2021, 5:21 PM

## 2021-05-01 NOTE — Evaluation (Signed)
Clinical/Bedside Swallow Evaluation Patient Details  Name: [name] [name] MRN: [id number] Date of Birth: January 18, 1961  Today's Date: 05/01/2021 Time: SLP Start Time (ACUTE ONLY): 50 SLP Stop Time (ACUTE ONLY): 0935 SLP Time Calculation (min) (ACUTE ONLY): 20 min  Past Medical History:  Past Medical History:  Diagnosis Date   Acute ST elevation myocardial infarction (STEMI) involving right coronary artery ([place]) 10/04/2015   x 2   Anxiety    Anxiety and depression    CAD (coronary artery disease)    a. CABG 06/2015: LIMA-LAD, SVG-RCA, SVG-OM2 b. STEMI s/p PCI to distal RCA lesion with a small Promus Premier stent. ( patent LIMA to the LAD, occluded SVG to OM, occluded SVG to RCA.)   CAD (coronary artery disease) of artery bypass graft; occluded SVG to RCA and occluded SVG to OM 10/08/2015   Empty sella (HCC)    GERD (gastroesophageal reflux disease)    Headache    from stress   Hematuria wed 06-13-2020   Hidradenitis axillaris    History of kidney stones    Hyperlipemia    Hypertension    Lupus ([place]) dx'd 2008   "they think I have this; have to get retested" (06/12/2015)   Obesity (BMI 30.0-34.9)    Pneumonia X 2   last time yrs ago   PONV (postoperative nausea and vomiting)    likes iv med or scopolamine patch works well   Psoriasis 06/15/2020   hands elbows, ears, small amount on scalp and bridge of nose   Rheumatoid aortitis    psorriatic arthritis   Rheumatoid arthritis ([place])    S/P angioplasty with stent; 10/04/15 to distal RCA lesion. with Promus premier. 10/08/2015   Scoliosis    Sleep apnea    patient states "it is mild" no cpap needed   Stroke [facility]) March 2014   left MCA branches; "they say I have them all the time", denies residual on 06/12/2015   Tobacco abuse    Type II diabetes mellitus ([place])    Urinary frequency    and urgency   Wears glasses    for reading   Past Surgical History:  Past Surgical History:  Procedure Laterality Date   ABDOMINAL HYSTERECTOMY   31 yrs ago   total has ovaies   BREAST BIOPSY Bilateral yrd ago   benign   CARDIAC CATHETERIZATION N/A 06/13/2015   Procedure: Left Heart Cath and Coronary Angiography;  Surgeon: Peter M Martinique, MD;  Location: [place] [facility];  Service: Cardiovascular;  Laterality: N/A;   CARDIAC CATHETERIZATION N/A 10/04/2015   Procedure: Left Heart Cath and Coronary Angiography;  Surgeon: [name], MD;  Location: [place] [facility];  Service: Cardiovascular;  Laterality: N/A;   CARDIAC CATHETERIZATION N/A 10/04/2015   Procedure: Coronary Stent Intervention;  Surgeon: [name], MD;  Location: [place] [facility];  Service: Cardiovascular;  Laterality: N/A;   CORONARY ARTERY BYPASS GRAFT N/A 06/19/2015   Procedure: CORONARY ARTERY BYPASS GRAFTING (CABG) TIMES FOUR USING LEFT INTERNAL MAMMARY, RIGHT SAPHENOUS LEG VEIN AND CRYO SAPHENOUS VEIN;  Surgeon: [name], MD;  Location: [place];  Service: Open Heart Surgery;  Laterality: N/A;   CYSTOSCOPY WITH RETROGRADE PYELOGRAM, URETEROSCOPY AND STENT PLACEMENT Left 06/21/2020   Procedure: CYSTOSCOPY WITH RETROGRADE PYELOGRAM, URETEROSCOPY, BASKET STONE EXTRACTION AND  STENT PLACEMENT;  Surgeon: Robley Fries, MD;  Location: [facility];  Service: Urology;  Laterality: Left;   DILATION AND CURETTAGE OF UTERUS  yrs ago  HOLMIUM LASER APPLICATION Left 04/24/4130   Procedure: HOLMIUM LASER APPLICATION;  Surgeon: Robley Fries, MD;  Location: [facility];  Service: Urology;  Laterality: Left;   HYDRADENITIS EXCISION Right 12/20/2019   Procedure: EXCISION OF RIGHT  HIDRADENITIS AXILLARY;  Surgeon: Donnie Mesa, MD;  Location: [place];  Service: General;  Laterality: Right;   IR URETERAL STENT LEFT NEW ACCESS W/O SEP NEPHROSTOMY CATH  01/10/2019   NEPHROLITHOTOMY Left 01/10/2019   Procedure: NEPHROLITHOTOMY PERCUTANEOUS;  Surgeon: Kathie Rhodes, MD;  Location: [facility];  Service: Urology;  Laterality: Left;   TEE  WITHOUT CARDIOVERSION N/A 06/19/2015   Procedure: TRANSESOPHAGEAL ECHOCARDIOGRAM (TEE);  Surgeon: [name], MD;  Location: [place];  Service: Open Heart Surgery;  Laterality: N/A;   TONSILLECTOMY  age 30   TUBAL LIGATION  many yrs ago   HPI:  Patient is a 61 y.o. female with PMH: DM, RA with chronic pain, HTN, HL, CAD s/p CABG. She was found on 2/14 by her son incontinent of urine and with AMS. EMS was called and patient initially intermittently responsive to pain and voice, initially hypotensive. Son reported to medical staff that patient was exhibiting a gradual worsening over last couple months with weight loss (30 lb), aching pain all over and swallowing issues (sensation of food getting caught in throat). She fell last week and was seen in ED on 2/9 and discharged home. Son also reported unsure if patient taking her medications correctly. In ED, BP improved, CT head negative, initially febrile at 100.9. CXR revealed  small volume right pleural effusion. Paitent was made NPO and speech swallow evaluation ordered.    Assessment / Plan / Recommendation  Clinical Impression  Patient presents with clinical s/s of dysphagia as per this clinical swallow evaluation. Patient's dysphagia currently is impacted by her poor cognitive function with poor attention and awareness. Patient would vocalize (moan, groan) and occasionally verbalize "okay", but not effectively communicating or interacting with SLP or RN who was in room. Patient allowed for oral care with oral mucosa appearing dry and RN reporting she had cleaned secretions out prior to this evaluation. Patient accepted two small ice chips and although she did exhibit some chewing movements with jaw, she did not effectively masticate. She did eventually initiate a swallow which was delayed and lead to cough response. Patient was then able to hold cup with maxA to bring to lips and give self small sips of thin liquids (water). She exhibited oral holding,  delayed swallow initiation and delayed cough response. Cough responses were congested sounding but non-productive. RN had already informed care team of likely need for Coretrak feeding tube, which SLP is in agreement with. SLP is recommending to continue with NPO status but allow for small amounts of ice chips PRN when patient alert and after oral care. SLP will continue to follow patient for readiness to trial PO's. SLP Visit Diagnosis: Dysphagia, unspecified (R13.10)    Aspiration Risk  Moderate aspiration risk;Risk for inadequate nutrition/hydration    Diet Recommendation Ice chips PRN after oral care;NPO   Medication Administration: Via alternative means    Other  Recommendations Oral Care Recommendations: Oral care QID;Staff/trained caregiver to provide oral care    Recommendations for follow up therapy are one component of a multi-disciplinary discharge planning process, led by the attending physician.  Recommendations may be updated based on patient status, additional functional criteria and insurance authorization.  Follow up Recommendations Other (comment) (TBD)      Assistance Recommended  at Discharge Frequent or constant Supervision/Assistance  Functional Status Assessment Patient has had a recent decline in their functional status and demonstrates the ability to make significant improvements in function in a reasonable and predictable amount of time.  Frequency and Duration min 2x/week  1 week       Prognosis Prognosis for Safe Diet Advancement: Good Barriers to Reach Goals: Cognitive deficits      Swallow Study   General Date of Onset: 04/30/21 HPI: Patient is a 61 y.o. female with PMH: DM, RA with chronic pain, HTN, HL, CAD s/p CABG. She was found on 2/14 by her son incontinent of urine and with AMS. EMS was called and patient initially intermittently responsive to pain and voice, initially hypotensive. Son reported to medical staff that patient was exhibiting a gradual  worsening over last couple months with weight loss (30 lb), aching pain all over and swallowing issues (sensation of food getting caught in throat). She fell last week and was seen in ED on 2/9 and discharged home. Son also reported unsure if patient taking her medications correctly. In ED, BP improved, CT head negative, initially febrile at 100.9. CXR revealed  small volume right pleural effusion. Paitent was made NPO and speech swallow evaluation ordered. Type of Study: Bedside Swallow Evaluation Previous Swallow Assessment: none found Diet Prior to this Study: NPO Temperature Spikes Noted: No Respiratory Status: Nasal cannula History of Recent Intubation: No Behavior/Cognition: Alert;Requires cueing;Distractible;Confused Oral Cavity Assessment: Dry Oral Care Completed by SLP: Yes Oral Cavity - Dentition: Adequate natural dentition Self-Feeding Abilities: Total assist;Needs assist;Needs set up Patient Positioning: Upright in bed Baseline Vocal Quality: Normal Volitional Cough: Cognitively unable to elicit Volitional Swallow: Unable to elicit    Oral/Motor/Sensory Function Overall Oral Motor/Sensory Function: Within functional limits   Ice Chips Ice chips: Impaired Oral Phase Impairments: Poor awareness of bolus;Impaired mastication Oral Phase Functional Implications: Prolonged oral transit Pharyngeal Phase Impairments: Suspected delayed Swallow;Cough - Delayed   Thin Liquid Thin Liquid: Impaired Presentation: Cup Oral Phase Impairments: Poor awareness of bolus Oral Phase Functional Implications: Prolonged oral transit;Oral holding Pharyngeal  Phase Impairments: Suspected delayed Swallow;Cough - Immediate;Cough - Delayed    Nectar Thick     Honey Thick     Puree Puree: Not tested   Solid     Solid: Not tested     Sonia Baller, MA, CCC-SLP Speech Therapy

## 2021-05-02 ENCOUNTER — Inpatient Hospital Stay (HOSPITAL_COMMUNITY): Payer: Medicare HMO

## 2021-05-02 ENCOUNTER — Other Ambulatory Visit: Payer: Self-pay | Admitting: Internal Medicine

## 2021-05-02 ENCOUNTER — Other Ambulatory Visit: Payer: Self-pay

## 2021-05-02 DIAGNOSIS — A419 Sepsis, unspecified organism: Secondary | ICD-10-CM | POA: Diagnosis not present

## 2021-05-02 DIAGNOSIS — R6521 Severe sepsis with septic shock: Secondary | ICD-10-CM | POA: Diagnosis not present

## 2021-05-02 DIAGNOSIS — E872 Acidosis, unspecified: Secondary | ICD-10-CM | POA: Diagnosis not present

## 2021-05-02 DIAGNOSIS — J869 Pyothorax without fistula: Secondary | ICD-10-CM | POA: Diagnosis not present

## 2021-05-02 LAB — CBC WITH DIFFERENTIAL/PLATELET
Abs Immature Granulocytes: 0.28 10*3/uL — ABNORMAL HIGH (ref 0.00–0.07)
Basophils Absolute: 0 10*3/uL (ref 0.0–0.1)
Basophils Relative: 0 %
Eosinophils Absolute: 0 10*3/uL (ref 0.0–0.5)
Eosinophils Relative: 0 %
HCT: 39.3 % (ref 36.0–46.0)
Hemoglobin: 12.2 g/dL (ref 12.0–15.0)
Immature Granulocytes: 4 %
Lymphocytes Relative: 9 %
Lymphs Abs: 0.6 10*3/uL — ABNORMAL LOW (ref 0.7–4.0)
MCH: 29.3 pg (ref 26.0–34.0)
MCHC: 31 g/dL (ref 30.0–36.0)
MCV: 94.2 fL (ref 80.0–100.0)
Monocytes Absolute: 0.2 10*3/uL (ref 0.1–1.0)
Monocytes Relative: 3 %
Neutro Abs: 5.8 10*3/uL (ref 1.7–7.7)
Neutrophils Relative %: 84 %
Platelets: 91 10*3/uL — ABNORMAL LOW (ref 150–400)
RBC: 4.17 MIL/uL (ref 3.87–5.11)
RDW: 17.2 % — ABNORMAL HIGH (ref 11.5–15.5)
WBC: 6.9 10*3/uL (ref 4.0–10.5)
nRBC: 1.2 % — ABNORMAL HIGH (ref 0.0–0.2)

## 2021-05-02 LAB — GLUCOSE, CAPILLARY
Glucose-Capillary: 163 mg/dL — ABNORMAL HIGH (ref 70–99)
Glucose-Capillary: 202 mg/dL — ABNORMAL HIGH (ref 70–99)
Glucose-Capillary: 209 mg/dL — ABNORMAL HIGH (ref 70–99)
Glucose-Capillary: 222 mg/dL — ABNORMAL HIGH (ref 70–99)
Glucose-Capillary: 165 mg/dL — ABNORMAL HIGH (ref 70–99)
Glucose-Capillary: 195 mg/dL — ABNORMAL HIGH (ref 70–99)

## 2021-05-02 LAB — BASIC METABOLIC PANEL
Anion gap: 8 (ref 5–15)
BUN: 12 mg/dL (ref 8–23)
CO2: 27 mmol/L (ref 22–32)
Calcium: 8.4 mg/dL — ABNORMAL LOW (ref 8.9–10.3)
Chloride: 113 mmol/L — ABNORMAL HIGH (ref 98–111)
Creatinine, Ser: 0.86 mg/dL (ref 0.44–1.00)
GFR, Estimated: >60 mL/min (ref >60)
Glucose, Bld: 173 mg/dL — ABNORMAL HIGH (ref 70–99)
Potassium: 3.3 mmol/L — ABNORMAL LOW (ref 3.5–5.1)
Sodium: 148 mmol/L — ABNORMAL HIGH (ref 135–145)

## 2021-05-02 LAB — URINE CULTURE: Culture: NO GROWTH

## 2021-05-02 LAB — ACID FAST SMEAR (AFB, MYCOBACTERIA): Acid Fast Smear: NEGATIVE

## 2021-05-02 LAB — MAGNESIUM: Magnesium: 2.5 mg/dL — ABNORMAL HIGH (ref 1.7–2.4)

## 2021-05-02 LAB — FOLATE: Folate: 6.2 ng/mL (ref >5.9)

## 2021-05-02 LAB — PHOSPHORUS
Phosphorus: 1.4 mg/dL — ABNORMAL LOW (ref 2.5–4.6)
Phosphorus: 3.1 mg/dL (ref 2.5–4.6)

## 2021-05-02 LAB — T4: T4, Total: 3.8 ug/dL — ABNORMAL LOW (ref 4.5–12.0)

## 2021-05-02 LAB — T3: T3, Total: 40 ng/dL — ABNORMAL LOW (ref 71–180)

## 2021-05-02 LAB — VITAMIN B12: Vitamin B-12: 1831 pg/mL — ABNORMAL HIGH (ref 180–914)

## 2021-05-02 LAB — CYTOLOGY - NON PAP

## 2021-05-02 MED ORDER — BETHANECHOL CHLORIDE 10 MG PO TABS
10.0000 mg | ORAL_TABLET | Freq: Three times a day (TID) | ORAL | Status: DC
  Administered 2021-05-02 – 2021-05-09 (×20): 10 mg
  Filled 2021-05-02 (×22): qty 1

## 2021-05-02 MED ORDER — PANTOPRAZOLE 2 MG/ML SUSPENSION
40.0000 mg | Freq: Every day | ORAL | Status: DC
  Administered 2021-05-02 – 2021-05-09 (×8): 40 mg
  Filled 2021-05-02 (×8): qty 20

## 2021-05-02 MED ORDER — FENTANYL CITRATE PF 50 MCG/ML IJ SOSY: 12.5000 ug | PREFILLED_SYRINGE | Freq: Once | INTRAMUSCULAR | Status: DC | PRN

## 2021-05-02 MED ORDER — B COMPLEX-C PO TABS
1.0000 | ORAL_TABLET | Freq: Every day | ORAL | Status: DC
  Administered 2021-05-02 – 2021-05-09 (×8): 1
  Filled 2021-05-02 (×8): qty 1

## 2021-05-02 MED ORDER — POTASSIUM PHOSPHATES 15 MMOLE/5ML IV SOLN
45.0000 mmol | Freq: Once | INTRAVENOUS | Status: AC
  Administered 2021-05-02: 45 mmol via INTRAVENOUS
  Filled 2021-05-02: qty 15

## 2021-05-02 MED ORDER — PIPERACILLIN-TAZOBACTAM 3.375 G IVPB
3.3750 g | Freq: Three times a day (TID) | INTRAVENOUS | Status: DC
  Administered 2021-05-02 – 2021-05-07 (×13): 3.375 g via INTRAVENOUS
  Filled 2021-05-02 (×18): qty 50

## 2021-05-02 MED ORDER — FREE WATER
200.0000 mL | Status: DC
  Administered 2021-05-02 – 2021-05-06 (×23): 200 mL

## 2021-05-02 MED ORDER — DOCUSATE SODIUM 50 MG/5ML PO LIQD: 100.0000 mg | Freq: Two times a day (BID) | ORAL | Status: DC | PRN

## 2021-05-02 MED ORDER — ASPIRIN 81 MG PO CHEW
81.0000 mg | CHEWABLE_TABLET | Freq: Every day | ORAL | Status: DC
  Administered 2021-05-02 – 2021-05-09 (×8): 81 mg
  Filled 2021-05-02 (×8): qty 1

## 2021-05-02 NOTE — Plan of Care (Signed)

## 2021-05-02 NOTE — Progress Notes (Signed)
Speech Language Pathology Treatment: Dysphagia  Patient Details Name: [name] [name] MRN: [id number] DOB: June 18, 1960 Today's Date: 05/02/2021 Time: 1015-1030 SLP Time Calculation (min) (ACUTE ONLY): 15 min  Assessment / Plan / Recommendation Clinical Impression  Patient seen with nurse present in room for skilled SLP treatment session focused on dysphagia goals. Patient was awake, alert, cooperative but remains with confusion. Per RN, her lips were very dry this AM and patient was refusing oral care. Patient was pleasant with SLP, and commented "Im hungry" when SLP introducing self and purpose of visit. Overall, she appears more aware of her surroundings than she was previous date, licking at Cortrak feeding tube, looking at hand mits, etc. She requested appropriately "Can you move me up?" when she was leaning over to the side in bed. SLP performed oral care and patient grimaced. SLP did observe appearance of sore on side tip of tongue and after RN inspected she felt it was a canker sore. Patient accepted two small bites of puree (applesauce) with suspected delayed swallow initiation but no overt s/s aspiration or penetration. She then took a single, small ice chip, moving it around her mouth and initiating a swallow without coughing, throat clearing. 1/2 teaspoon of water given and patient did not exhibit any coughing or throat clearing but with small controlled cup sip of water, patient held water in front of her mouth, then started to have cough response, and after SLP told her she could "spit it out", patient then spit water into washcloth. Although patient is improving with her mentation, she does not appear quite ready to initiate PO's. SLP to follow up with patient next date for further PO trials and determine need/readiness for objective swallow study (MBS).   HPI HPI: Patient is a 61 y.o. female with PMH: DM, RA with chronic pain, HTN, HL, CAD s/p CABG. She was found on 2/14 by her son  incontinent of urine and with AMS. EMS was called and patient initially intermittently responsive to pain and voice, initially hypotensive. Son reported to medical staff that patient was exhibiting a gradual worsening over last couple months with weight loss (30 lb), aching pain all over and swallowing issues (sensation of food getting caught in throat). She fell last week and was seen in ED on 2/9 and discharged home. Son also reported unsure if patient taking her medications correctly. In ED, BP improved, CT head negative, initially febrile at 100.9. CXR revealed  small volume right pleural effusion. Paitent was made NPO and speech swallow evaluation ordered.      SLP Plan  Continue with current plan of care      Recommendations for follow up therapy are one component of a multi-disciplinary discharge planning process, led by the attending physician.  Recommendations may be updated based on patient status, additional functional criteria and insurance authorization.    Recommendations  Diet recommendations: NPO Medication Administration: Via alternative means                Oral Care Recommendations: Oral care QID;Staff/trained caregiver to provide oral care Follow Up Recommendations: Other (comment) Assistance recommended at discharge: Frequent or constant Supervision/Assistance SLP Visit Diagnosis: Dysphagia, unspecified (R13.10) Plan: Continue with current plan of care         Sonia Baller, MA, CCC-SLP Speech Therapy

## 2021-05-02 NOTE — Progress Notes (Signed)
[facility] to take over as primary tomorrow morning at 7am. Transfer order placed.    [name], patient's son, updated on patient care today. [name] and his brother are working today and [name] is out of town. [name] and/or his brother plan to visit this afternoon. [name]'s fiance may be able to come up earlier today.

## 2021-05-02 NOTE — Progress Notes (Addendum)
NAME:  [name] [name], MRN:  [id number], DOB:  01-14-1961, LOS: 1 ADMISSION DATE:  04/30/2021, CONSULTATION DATE:  04/30/21 REFERRING MD:  Eulis Foster CHIEF COMPLAINT:  Acute Encephelopathy   History of Present Illness:  [name] [name] is a 61 yo woman with PMH significant for DM on Metformin, RA with chronic pain, HTN, HL, CAD s/p CABG, L renal stone and UTI s/p cystoscopy and lithotripsy 06/2020 who was found by her son 2/14 incontinent of urine and altered.  He reports that he saw her 2/14 at her recent baseline.  EMS called, initially intermittently responsive to pain and voice.  Initially hypotensive, given 200cc NS by EMS.  Her son reports gradually worsening over the last couple of months with recent Wt loss, aching pain all over, 30lb wt loss, swallowing issues (food caught in throat sensation).  She fell last week and was seen in the ED 2/9 and discharged home.  He is not sure if she has been taking her medications correctly   In the ED BP improved, CTH negative, Found to be hypernatremic 152, elevated lactic acidosis 8.7 and Initially febrile to 100.9, ABG 7.389/35/67, UA with Ketones and protein, trace leuks and few bacteria.  Creatinine elevated to 1.9, recent baseline appears to be <1.   In ED: received 2.5L  LR, flagyl, vanc, cefepime.  When her lactic acid remained elevated at 8.3 and mental status did not improve PCCM consulted for admission.   Pertinent  Medical History  Generalized pain, arthritis.  DM HTN HLD CAD s/p CABG  Arthritis (RA?) Hx of stroke (?) Prednisone taper 03/2021 Anxiety depression  Takes flomax Ecoli uti from 04/02/21 RA  Significant Hospital Events: Including procedures, antibiotic start and stop dates in addition to other pertinent events   2/14 Presented to ED after being found altered by family, concern for UTI and sepsis, started on Vanc/Cefepime.    Interim History / Subjective:    Objective   Blood pressure (!) 147/89, pulse 99, temperature 99.1 F  (37.3 C), temperature source Oral, resp. rate 20, height 5\' 6"  (1.676 m), weight 77.8 kg, SpO2 94 %.        Intake/Output Summary (Last 24 hours) at 05/02/2021 0527 Last data filed at 05/02/2021 0000 Gross per 24 hour  Intake 2635.04 ml  Output 1142.5 ml  Net 1492.54 ml    Filed Weights   04/30/21 1832 05/01/21 0400  Weight: 81.6 kg 77.8 kg    Examination:  General: Chronically ill-appearing female, temporal wasting Cardiovascular: Normal rate, regular rhythm.  No murmurs, rubs, or gallops Pulmonary : Effort normal, breath sounds normal. No wheezes, rales, or rhonchi Abdominal: soft, nontender,  bowel sounds present Skin: Warm, dry , no bruising, erythema, or rash Neuro: Wakes up to voice, focuses to me, oriented to name, does not answer further questions.    Labs 2/16  Sodium 115 > 148 Potassium 3.7 > 3.3 CO2 20 > 27 Creatinine 1.02 > .86  Phos 1.4 Mag 2.5  WBC 10.9 > 6.9 Platelets 128 > 91  Pleural fluid, cell count 1700  TSH .092 (low) T4 .62 (nl) T3 in process  Serum : LDH 1154 Pleural fluid: LDH 2,497  Imaging 2/16  Chest x-ray: Trace right pleural effusion, no significant interval change  Blood cultures 2/14 NGTD Pleural fluid culture, no organisms seen Urine culture 2/14 no growth Resolved Hospital Problem list   AKI  Assessment & Plan:   Acute encephalopathy Hypernatremia Sepsis  - Multifactorial in setting of sepsis and hypernatremia  from decreased intake -Chronic pleural effusion drained yesterday, exudative by lights criteria, purulent fluid.  -No blood or urinary source of infection at this time, continue to follow culture data -Continue half-normal saline - Continue Zosyn  Right Empyema s/p thoracentesis on 2/15 -  exudative by light's criteria  - f/u remaining pleural fluid labs - Continue Zosyn   T2DM A1c 6.7 on 2/14.  On metformin at home At hospital goal -SSI  CAD s/p CABG in 4492 Combined systolic and diastolic heart  failure - mildly reduced EF on TTE 2/15, wall motion abnormalities , consistent with prior MI - continue asprin and atorvastatin   Moderate malnutrition Refeeding syndrome - patient with decreased intake recently, possibly related to dysphasia. By chart weights, approximately 18 kg weight loss over the past year.  - K and Phos given this morning by Elink -Tube feeds.  Core track  HTN - Max systolic 010 - Home meds amlodipine 5, PO metoprolol tartrate 37.5 TID, Imdur 30 mg daily - Restart home medications when able to take PO  Dysphasia  - slp following. Will trial PO as patients mentation improves.   Psoriatic Arthritis - Follows with [facility], Plan was to start Cosentyx. Not currently on treatment due to recurrent pneumonia.   Best Practice (right click and "Reselect all SmartList Selections" daily)   Diet/type: NPO DVT prophylaxis: prophylactic heparin  GI prophylaxis: PPI Lines: N/A Foley:  Yes, and it is still needed Code Status:  full code Last date of multidisciplinary goals of care discussion [x]

## 2021-05-02 NOTE — Progress Notes (Signed)
Nutrition Follow-up  DOCUMENTATION CODES:   Non-severe (moderate) malnutrition in context of chronic illness  INTERVENTION:   Continue tube feeding via Cortrak: Jevity 1.2 at 45 ml/h, increase by 10 ml every 8 hours to goal rate of 65 ml/h (1560 ml per day) Prosource TF 45 ml BID  Provides 1952 kcal, 109 gm protein, 1264 ml free water daily.  Continue to monitor magnesium, potassium, and phosphorus BID for at least 3 days, MD to replete as needed, as pt is at risk for refeeding syndrome given protein calorie malnutrition with severe weight loss.  Continue free water flushes 200 ml q 4 hours for a total of 2464 ml free water daily when TF reaches goal rate.  Add B-complex with vitamin C to supplement for suspected deficiency.   Check B vitamin levels: thiamine, B-6, folic acid, E-52.  NUTRITION DIAGNOSIS:   Moderate Malnutrition related to chronic illness, dysphagia as evidenced by moderate muscle depletion, moderate fat depletion, percent weight loss (15% weight loss within 6 months).  GOAL:   Patient will meet greater than or equal to 90% of their needs  MONITOR:   TF tolerance, Skin, Labs, Diet advancement  REASON FOR ASSESSMENT:   Consult Enteral/tube feeding initiation and management  ASSESSMENT:   61 yo female admitted with acute encephalopathy after being found incontinent of urine and altered by her son. PMH includes DM, RA, HTN, HLD, CAD, L renal stone.  Discussed patient in ICU rounds and with RN today. Cortrak tube in place, tip is in the stomach.   Currently receiving Jevity 1.2 at 45 ml/h. Increasing slowly to goal rate of 65 ml/h. Free water flushes 200 ml every 4 hours.  Refeeding labs: K 3.3 (L), phos 1.4 (L), mag 2.5 (H) Supplementing with potassium phosphate IV. Discussed potential niacin/B-vitamin deficiency with CCM MD during rounds.  B-complex with vitamin C has been ordered.  Will check vitamin panel.   Labs reviewed. Na 148  CBG:  [phone]  Medications reviewed and include Novolog, Protonix. Potassium phosphate. IVF: 1/2 NS at 100 ml/h.  SLP following for swallow evaluation.  Remains NPO.  Diet Order:   Diet Order             Diet NPO time specified Except for: Ice Chips  Diet effective now                   EDUCATION NEEDS:   No education needs have been identified at this time  Skin:  Skin Assessment: Skin Integrity Issues: Skin Integrity Issues:: Stage I Stage I: sacrum  Last BM:  2/16 type 7  Height:   Ht Readings from Last 1 Encounters:  04/30/21 5\' 6"  (1.676 m)    Weight:   Wt Readings from Last 1 Encounters:  05/02/21 74.6 kg    BMI:  Body mass index is 26.55 kg/m.  Estimated Nutritional Needs:   Kcal:  1900-2100  Protein:  100-115 gm  Fluid:  >/= 1.9 L    Lucas Mallow RD, LDN, CNSC Please refer to Amion for contact information.

## 2021-05-02 NOTE — Progress Notes (Signed)
Phosphorus 1.4, Potassium 3.3 Replaced per protocol

## 2021-05-03 ENCOUNTER — Inpatient Hospital Stay (HOSPITAL_COMMUNITY): Payer: Medicare HMO

## 2021-05-03 DIAGNOSIS — G9340 Encephalopathy, unspecified: Secondary | ICD-10-CM | POA: Diagnosis not present

## 2021-05-03 DIAGNOSIS — R410 Disorientation, unspecified: Secondary | ICD-10-CM | POA: Diagnosis not present

## 2021-05-03 DIAGNOSIS — N179 Acute kidney failure, unspecified: Secondary | ICD-10-CM | POA: Diagnosis not present

## 2021-05-03 DIAGNOSIS — E876 Hypokalemia: Secondary | ICD-10-CM | POA: Diagnosis not present

## 2021-05-03 LAB — GLUCOSE, CAPILLARY
Glucose-Capillary: 187 mg/dL — ABNORMAL HIGH (ref 70–99)
Glucose-Capillary: 171 mg/dL — ABNORMAL HIGH (ref 70–99)
Glucose-Capillary: 223 mg/dL — ABNORMAL HIGH (ref 70–99)
Glucose-Capillary: 248 mg/dL — ABNORMAL HIGH (ref 70–99)
Glucose-Capillary: 294 mg/dL — ABNORMAL HIGH (ref 70–99)
Glucose-Capillary: 277 mg/dL — ABNORMAL HIGH (ref 70–99)

## 2021-05-03 LAB — CBC WITH DIFFERENTIAL/PLATELET
Abs Immature Granulocytes: 0.24 10*3/uL — ABNORMAL HIGH (ref 0.00–0.07)
Basophils Absolute: 0.1 10*3/uL (ref 0.0–0.1)
Basophils Relative: 1 %
Eosinophils Absolute: 0.1 10*3/uL (ref 0.0–0.5)
Eosinophils Relative: 2 %
HCT: 40.5 % (ref 36.0–46.0)
Hemoglobin: 12.7 g/dL (ref 12.0–15.0)
Immature Granulocytes: 5 %
Lymphocytes Relative: 20 %
Lymphs Abs: 1 10*3/uL (ref 0.7–4.0)
MCH: 28.7 pg (ref 26.0–34.0)
MCHC: 31.4 g/dL (ref 30.0–36.0)
MCV: 91.6 fL (ref 80.0–100.0)
Monocytes Absolute: 0.3 10*3/uL (ref 0.1–1.0)
Monocytes Relative: 5 %
Neutro Abs: 3.3 10*3/uL (ref 1.7–7.7)
Neutrophils Relative %: 67 %
Platelets: 77 10*3/uL — ABNORMAL LOW (ref 150–400)
RBC: 4.42 MIL/uL (ref 3.87–5.11)
RDW: 17.2 % — ABNORMAL HIGH (ref 11.5–15.5)
WBC: 5 10*3/uL (ref 4.0–10.5)
nRBC: 0.8 % — ABNORMAL HIGH (ref 0.0–0.2)

## 2021-05-03 LAB — BASIC METABOLIC PANEL
Anion gap: 7 (ref 5–15)
BUN: 13 mg/dL (ref 8–23)
CO2: 28 mmol/L (ref 22–32)
Calcium: 7.7 mg/dL — ABNORMAL LOW (ref 8.9–10.3)
Chloride: 107 mmol/L (ref 98–111)
Creatinine, Ser: 0.6 mg/dL (ref 0.44–1.00)
GFR, Estimated: >60 mL/min (ref >60)
Glucose, Bld: 204 mg/dL — ABNORMAL HIGH (ref 70–99)
Potassium: 3 mmol/L — ABNORMAL LOW (ref 3.5–5.1)
Sodium: 142 mmol/L (ref 135–145)

## 2021-05-03 LAB — MAGNESIUM: Magnesium: 1.8 mg/dL (ref 1.7–2.4)

## 2021-05-03 LAB — PHOSPHORUS: Phosphorus: 1.5 mg/dL — ABNORMAL LOW (ref 2.5–4.6)

## 2021-05-03 MED ORDER — POTASSIUM CHLORIDE CRYS ER 20 MEQ PO TBCR
20.0000 meq | EXTENDED_RELEASE_TABLET | Freq: Once | ORAL | Status: AC
Start: 2021-05-03 — End: 2021-05-03
  Administered 2021-05-03: 20 meq via ORAL
  Filled 2021-05-03: qty 1

## 2021-05-03 MED ORDER — MAGNESIUM SULFATE 2 GM/50ML IV SOLN
2.0000 g | Freq: Once | INTRAVENOUS | Status: AC
Start: 2021-05-03 — End: 2021-05-03
  Administered 2021-05-03: 2 g via INTRAVENOUS
  Filled 2021-05-03: qty 50

## 2021-05-03 MED ORDER — AMLODIPINE BESYLATE 5 MG PO TABS
5.0000 mg | ORAL_TABLET | Freq: Every day | ORAL | Status: DC
  Administered 2021-05-03 – 2021-05-09 (×7): 5 mg
  Filled 2021-05-03 (×7): qty 1

## 2021-05-03 MED ORDER — POTASSIUM PHOSPHATES 15 MMOLE/5ML IV SOLN
30.0000 mmol | Freq: Once | INTRAVENOUS | Status: AC
  Administered 2021-05-03: 30 mmol via INTRAVENOUS
  Filled 2021-05-03: qty 10

## 2021-05-03 NOTE — Progress Notes (Signed)
Speech Language Pathology Treatment: Dysphagia  Patient Details Name: [name] [name] MRN: [id number] DOB: 20-Nov-1960 Today's Date: 05/03/2021 Time: 5027-7412 SLP Time Calculation (min) (ACUTE ONLY): 17 min  Assessment / Plan / Recommendation Clinical Impression  Pt woke easily with additional time for trials of various textures for potential po initiation. Oral care performed and pt fed self with hand over hand assist to hold cup. She was able to use spoon independently with applesauce. Initial immediate throat clearing concerning for possible airway compromise. As pt continued to wake larger straw sips appeared timely without s/sx aspiration. Pt will need full assist with recommended Dys 2 diet texture (delayed mastication and liquid was needed), thin liquid via cup or straw and pills whole in puree. ST will follow.    HPI HPI: Patient is a 61 y.o. female with PMH: DM, RA with chronic pain, HTN, HL, CAD s/p CABG. She was found on 2/14 by her son incontinent of urine and with AMS. EMS was called and patient initially intermittently responsive to pain and voice, initially hypotensive. Son reported to medical staff that patient was exhibiting a gradual worsening over last couple months with weight loss (30 lb), aching pain all over and swallowing issues (sensation of food getting caught in throat). She fell last week and was seen in ED on 2/9 and discharged home. Son also reported unsure if patient taking her medications correctly. In ED, BP improved, CT head negative, initially febrile at 100.9. CXR revealed  small volume right pleural effusion. Paitent was made NPO and speech swallow evaluation ordered.      SLP Plan  Continue with current plan of care      Recommendations for follow up therapy are one component of a multi-disciplinary discharge planning process, led by the attending physician.  Recommendations may be updated based on patient status, additional functional criteria and insurance  authorization.    Recommendations  Diet recommendations: Dysphagia 2 (fine chop);Thin liquid Liquids provided via: Cup;Straw Medication Administration: Whole meds with puree Compensations: Slow rate;Small sips/bites;Minimize environmental distractions Postural Changes and/or Swallow Maneuvers: Seated upright 90 degrees                Oral Care Recommendations: Oral care BID Follow Up Recommendations: Skilled nursing-short term rehab (<3 hours/day) SLP Visit Diagnosis: Dysphagia, unspecified (R13.10) Plan: Continue with current plan of care           Houston Siren  05/03/2021, 10:46 AM

## 2021-05-03 NOTE — Care Management Important Message (Signed)
Important Message  Patient Details  Name: [name] [name] MRN: [id number] Date of Birth: 12-29-1960   Medicare Important Message Given:  Yes     Orbie Pyo 05/03/2021, 2:22 PM

## 2021-05-03 NOTE — Progress Notes (Signed)
[facility] PCCM  CXR images personally reviewed No significant change in trace right pleural effusion Pulmonary will sign off, available as needed  Roselie Awkward, MD [facility] PCCM Pager: [phone] Cell: [phone] After 7:00 pm call Elink  [phone]

## 2021-05-03 NOTE — TOC Initial Note (Signed)
Transition of Care [facility]) - Initial/Assessment Note    Patient Details  Name: DORCAS MELITO MRN: [id number] Date of Birth: [birth year]  [facility]) CM/SW Contact:    Verdell Carmine, RN Phone Number: 05/03/2021, 8:28 AM  Clinical Narrative:                  Patient presents from home, AMS. Lives in apartment, has son that checks on her. Currently fluctuating in orientation, refusing treatments. Mitts on. Feeding tube in place.  PT and OT to evaluate today. Will follow for their recommendations. She likely will be a SNF candidate for short term rehab. If refuses would need someone to be with her for safety concerns.. She does have a PCP for follow up care.  TOC will follow for needs, recommendations, and transitions. Expected Discharge Plan: Skilled Nursing Facility Barriers to Discharge: Continued Medical Work up   Patient Goals and CMS Choice        Expected Discharge Plan and Services Expected Discharge Plan: [place] In-house Referral: Clinical Social Work     Living arrangements for the past 2 months: Apartment                                      Prior Living Arrangements/Services Living arrangements for the past 2 months: Apartment Lives with:: Self Patient language and need for interpreter reviewed:: Yes        Need for Family Participation in Patient Care: Yes (Comment) Care giver support system in place?: Yes (comment)   Criminal Activity/Legal Involvement Pertinent to Current Situation/Hospitalization: No - Comment as needed  Activities of Daily Living Home Assistive Devices/Equipment: None ADL Screening (condition at time of admission) Patient's cognitive ability adequate to safely complete daily activities?: No Is the patient deaf or have difficulty hearing?: Yes Does the patient have difficulty seeing, even when wearing glasses/contacts?: No Does the patient have difficulty concentrating, remembering, or making  decisions?: Yes Patient able to express need for assistance with ADLs?: Yes Does the patient have difficulty dressing or bathing?: Yes Independently performs ADLs?: No Does the patient have difficulty walking or climbing stairs?: Yes Weakness of Legs: Both Weakness of Arms/Hands: Both  Permission Sought/Granted                  Emotional Assessment       Orientation: : Fluctuating Orientation (Suspected and/or reported Sundowners) Alcohol / Substance Use: Not Applicable Psych Involvement: No (comment)  Admission diagnosis:  Hypokalemia [E87.6] Hyponatremia [E87.1] Encephalopathy [G93.40] Sepsis ([place]) [A41.9] Urinary tract infection without hematuria, site unspecified [N39.0] AMS (altered mental status) [R41.82] Patient Active Problem List   Diagnosis Date Noted   AMS (altered mental status) 05/01/2021   Pressure injury of skin 05/01/2021   Malnutrition of moderate degree 05/01/2021   Hypokalemia    Urinary tract infection without hematuria    Lactic acidosis    Hypernatremia    Reactive depression (situational) 04/05/2021   Chest pain 04/06/2020   Coronary artery disease    Gastroesophageal reflux disease without esophagitis    Renal calculus, left 01/10/2019   Chest pain in adult 03/02/2017   Hypoxia    Acute on chronic diastolic CHF (congestive heart failure) (HCC)    Hx of CABG    S/P angioplasty with stent; 10/04/15 to distal RCA lesion. with Promus premier. 10/08/2015   History of ST elevation myocardial infarction (STEMI) 10/04/2015  Superficial incisional surgical site infection, medial left lower extremity 07/10/2015   Hyperlipidemia with target LDL less than 70 06/13/2015   Chest pain with moderate risk of acute coronary syndrome    Tobacco abuse    Uncontrolled diabetes mellitus with hyperglycemia ([place]) 06/04/2012   Lupus ([place]) 06/03/2012   History of stroke 06/03/2012   Empty sella syndrome ([place]) 06/03/2012   Accelerated hypertension 06/03/2012    Rheumatoid arthritis ([place]) 06/03/2012   Hidradenitis axillaris 06/03/2012   Obesity (BMI 30.0-34.9)    PCP:  Leeroy Cha, MD Pharmacy:   [facility], [place] [address] Phone: [phone] Fax: [phone]  [id number] - [place], [place] [address] [place] [address] Phone: [phone] Fax: [phone]     Social Determinants of Health (SDOH) Interventions    Readmission Risk Interventions No flowsheet data found.

## 2021-05-03 NOTE — Progress Notes (Signed)
PT Cancellation Note  Patient Details Name: GAETANA KAWAHARA MRN: [id number] DOB: 11-19-1960   Cancelled Treatment:    Reason Eval/Treat Not Completed: Patient not medically ready. Patient continues to be confused and perseverating on the fact that her rectal tube is causing a lot of pain. 9/10. Wants to pull it out. Has mitts on. Will re-attempt when patient more appropriate and safe for mobility.     Lada Fulbright 05/03/2021, 11:37 AM

## 2021-05-03 NOTE — Progress Notes (Signed)
OT Cancellation Note  Patient Details Name: [name] [name] MRN: [id number] DOB: 29-Jun-1960   Cancelled Treatment:    Reason Eval/Treat Not Completed: Other (comment) (Rn requestng OT to hold at this time) Pt currently with restraints in place.  Billey Chang, OTR/L  Acute Rehabilitation Services Pager: [phone] Office: [phone] .  05/03/2021, 11:46 AM

## 2021-05-03 NOTE — Plan of Care (Signed)

## 2021-05-03 NOTE — Plan of Care (Signed)
°  Problem: Education: Goal: Knowledge of General Education information will improve Description: Including pain rating scale, medication(s)/side effects and non-pharmacologic comfort measures 05/03/2021 0445 by Brooke Bonito, RN Outcome: Progressing 05/03/2021 0445 by Brooke Bonito, RN Outcome: Progressing   Problem: Health Behavior/Discharge Planning: Goal: Ability to manage health-related needs will improve 05/03/2021 0445 by Brooke Bonito, RN Outcome: Progressing 05/03/2021 0445 by Brooke Bonito, RN Outcome: Progressing   Problem: Clinical Measurements: Goal: Ability to maintain clinical measurements within normal limits will improve 05/03/2021 0445 by Brooke Bonito, RN Outcome: Progressing 05/03/2021 0445 by Brooke Bonito, RN Outcome: Progressing Goal: Will remain free from infection 05/03/2021 0445 by Brooke Bonito, RN Outcome: Progressing 05/03/2021 0445 by Brooke Bonito, RN Outcome: Progressing Goal: Diagnostic test results will improve 05/03/2021 0445 by Brooke Bonito, RN Outcome: Progressing 05/03/2021 0445 by Brooke Bonito, RN Outcome: Progressing Goal: Respiratory complications will improve 05/03/2021 0445 by Brooke Bonito, RN Outcome: Progressing 05/03/2021 0445 by Brooke Bonito, RN Outcome: Progressing Goal: Cardiovascular complication will be avoided 05/03/2021 0445 by Brooke Bonito, RN Outcome: Progressing 05/03/2021 0445 by Brooke Bonito, RN Outcome: Progressing   Problem: Activity: Goal: Risk for activity intolerance will decrease 05/03/2021 0445 by Brooke Bonito, RN Outcome: Progressing 05/03/2021 0445 by Brooke Bonito, RN Outcome: Progressing   Problem: Nutrition: Goal: Adequate nutrition will be maintained 05/03/2021 0445 by Brooke Bonito, RN Outcome: Progressing 05/03/2021 0445 by Brooke Bonito, RN Outcome: Progressing   Problem: Coping: Goal: Level of anxiety will decrease 05/03/2021 0445 by Brooke Bonito, RN Outcome: Progressing 05/03/2021 0445 by  Brooke Bonito, RN Outcome: Progressing   Problem: Elimination: Goal: Will not experience complications related to bowel motility 05/03/2021 0445 by Brooke Bonito, RN Outcome: Progressing 05/03/2021 0445 by Brooke Bonito, RN Outcome: Progressing Goal: Will not experience complications related to urinary retention 05/03/2021 0445 by Brooke Bonito, RN Outcome: Progressing 05/03/2021 0445 by Brooke Bonito, RN Outcome: Progressing   Problem: Pain Managment: Goal: General experience of comfort will improve 05/03/2021 0445 by Brooke Bonito, RN Outcome: Progressing 05/03/2021 0445 by Brooke Bonito, RN Outcome: Progressing   Problem: Safety: Goal: Ability to remain free from injury will improve 05/03/2021 0445 by Brooke Bonito, RN Outcome: Progressing 05/03/2021 0445 by Brooke Bonito, RN Outcome: Progressing   Problem: Skin Integrity: Goal: Risk for impaired skin integrity will decrease 05/03/2021 0445 by Brooke Bonito, RN Outcome: Progressing 05/03/2021 0445 by Brooke Bonito, RN Outcome: Progressing

## 2021-05-03 NOTE — Progress Notes (Signed)
PROGRESS NOTE        PATIENT DETAILS Name: [name] [name] Age: 61 y.o. Sex: female Date of Birth: 11/29/1960 Admit Date: 04/30/2021 Admitting Physician Collier Bullock, MD WHQ:[id number], Ronie Spies, MD  Brief Summary: Patient is a 61 y.o.  female with history of psoriatic arthritis, DM-2, HTN, CAD s/p CABG-presented to the hospital on 2/14 with confusion, hypotension, fever-she was found to have septic shock due to empyema-monitor closely in the [place] thoracocentesis-improved-and subsequently transferred to the [facility] service.  See below for further details.  Significant Hospital events: 2/14>> admit to ICU-confused/hypotensive/febrile-severe sepsis/septic shock-from empyema.  Numerous electrolyte abnormalities. 2/15>> thoracocentesis 2/17>> transfer to [facility]  Significant imaging studies: 2/14>> CT head: No acute abnormality 2/14>> CXR: Small volume right pleural effusion 2/15>> renal ultrasound: No hydronephrosis 2/15>> Echo: EF 99%, grade 1 diastolic dysfunction 3/57>> CXR: Mild right basilar atelectasis/infiltrate with small right pleural effusion.  Significant microbiology data: 2/14>> COVID/influenza PCR: Negative 2/14>> blood culture: Negative 2/14>> urine culture: Negative 2/15>> pleural fluid culture: No growth 2/15>> pleural fluid AFB smear: Negative 2/15>> pleural fluid AFB culture: Pending  Procedures: 2/15>> thoracocentesis  Consults: PCCM  Subjective: Lying comfortably in bed-denies any chest pain or shortness of breath.  Answering simple questions appropriately.  Has NG tube in rectocele in place.  Objective: Vitals: Blood pressure 134/81, pulse 78, temperature 97.6 F (36.4 C), temperature source Oral, resp. rate 19, height 5\' 6"  (1.676 m), weight 80.9 kg, SpO2 100 %.   Exam: Gen Exam:Alert awake-not in any distress.  Frail appearing. HEENT:atraumatic, normocephalic Chest: B/L clear to auscultation anteriorly CVS:S1S2  regular Abdomen:soft non tender, non distended Extremities:no edema Neurology: Non focal-generalized weakness. Skin: no rash  Pertinent Labs/Radiology: CBC Latest Ref Rng & Units 05/03/2021 05/02/2021 05/01/2021  WBC 4.0 - 10.5 K/uL 5.0 6.9 10.9(H)  Hemoglobin 12.0 - 15.0 g/dL 12.7 12.2 14.1  Hematocrit 36.0 - 46.0 % 40.5 39.3 46.3(H)  Platelets 150 - 400 K/uL 77(L) 91(L) 128(L)    Lab Results  Component Value Date   NA 142 05/03/2021   K 3.0 (L) 05/03/2021   CL 107 05/03/2021   CO2 28 05/03/2021      Assessment/Plan: Septic shock due to empyema-s/p thoracocentesis on 2/15: Sepsis physiology has significantly improved-cultures negative so far-on IV Zosyn.  No reaccumulation on CXR done on 2/17-no further recommendations from PCCM.  Acute metabolic encephalopathy: Due to sepsis/AKI/hypernatremia-remarkably better-suspect will continue to improve.  CT head was negative for acute abnormalities.  AKI: Likely hemodynamically mediated-resolved with supportive care.  Hypernatremia: Resolved-stop hypertonic saline.  Continue tube feeds and see how she does.  Hypokalemia: Replete and recheck.  Hypophosphatemia: Replete and recheck.  Watch closely for signs of refeeding syndrome.  HTN: BP slowly creeping up-resume amlodipine today-follow trend and resume Imdur/metoprolol accordingly.  DM-2: CBG stable with SSI-resume oral hypoglycemic agents on discharge.   Recent Labs    05/03/21 0307 05/03/21 0736 05/03/21 1155  GLUCAP 187* 171* 223*     CAD s/p CABG in 2017 and PCI/DES to RCA 2017:troponins were minimally elevated and flat-not consistent with ACS-likely demand ischemia in the setting of sepsis.  She does not have any anginal symptoms.  Continue aspirin/statin  Combined chronic systolic/diastolic heart failure: Volume status stable-no role for diuretics currently.  Dysphagia: Suspect due to acute illness related debility/deconditioning-reevaluated by SLP on 2/17-starting  dysphagia 2 diet-continue NG tube feedings for now.  If able to tolerate diet well-May be able to discontinue NG tube over the next few days.  Acute urinary retention: Suspect due to acute illness related debility/deconditioning-continue Foley catheter-voiding trial in the next few days.  Remains on Urecholine.  Thrombocytopenia: Likely from sepsis-reviewed chart with pharmacy-platelet count had already started going down before SQ heparin was started.  We will stop SQ heparin-and monitor platelet counts-if they continue to decrease-May need to send out HIT antibodies.    GERD: Continue PPI  History of psoriatic arthritis: Follows with rheumatology in the outpatient setting.  Debility/deconditioning/functional quadriplegia: Due to acute illness-await PT/OT eval-suspect will require SNF.  Nutrition Status: Nutrition Problem: Moderate Malnutrition Etiology: chronic illness, dysphagia Signs/Symptoms: moderate muscle depletion, moderate fat depletion, percent weight loss (15% weight loss within 6 months) Percent weight loss: 15 % Interventions: Tube feeding, MVI  BMI: Estimated body mass index is 28.79 kg/m as calculated from the following:   Height as of this encounter: 5\' 6"  (1.676 m).   Weight as of this encounter: 80.9 kg.   Code status:   Code Status: Full Code   DVT Prophylaxis: SCDs Start: 05/01/21 0019   Family Communication: Son [name]-updated over the phone on 2/17   Disposition Plan: Status is: Inpatient Remains inpatient appropriate because: Resolving sepsis physiology/resolving encephalopathy-on IV antibiotics-needs several more days of hospitalization to ensure stability and appropriate discharge disposition.  Probably SNF on discharge.    Planned Discharge Destination:Skilled nursing facility   Diet: Diet Order             DIET DYS 2 Room service appropriate? Yes; Fluid consistency: Thin  Diet effective now                      Antimicrobial agents: Anti-infectives (From admission, onward)    Start     Dose/Rate Route Frequency Ordered Stop   05/02/21 1800  piperacillin-tazobactam (ZOSYN) IVPB 3.375 g        3.375 g 12.5 mL/hr over 240 Minutes Intravenous Every 8 hours 05/02/21 1220 05/08/21 2159   05/01/21 2200  ceFEPIme (MAXIPIME) 2 g in sodium chloride 0.9 % 100 mL IVPB  Status:  Discontinued        2 g 200 mL/hr over 30 Minutes Intravenous Every 12 hours 05/01/21 0735 05/01/21 1722   05/01/21 2100  vancomycin (VANCOREADY) IVPB 1500 mg/300 mL  Status:  Discontinued        1,500 mg 150 mL/hr over 120 Minutes Intravenous Every 24 hours 04/30/21 1956 05/01/21 1009   05/01/21 1800  piperacillin-tazobactam (ZOSYN) IVPB 3.375 g  Status:  Discontinued        3.375 g 12.5 mL/hr over 240 Minutes Intravenous Every 8 hours 05/01/21 1722 05/02/21 1220   04/30/21 2000  ceFEPIme (MAXIPIME) 2 g in sodium chloride 0.9 % 100 mL IVPB  Status:  Discontinued        2 g 200 mL/hr over 30 Minutes Intravenous Every 8 hours 04/30/21 1923 05/01/21 0735   04/30/21 1915  vancomycin (VANCOREADY) IVPB 1750 mg/350 mL        1,750 mg 175 mL/hr over 120 Minutes Intravenous  Once 04/30/21 1905 04/30/21 2228   04/30/21 1900  metroNIDAZOLE (FLAGYL) IVPB 500 mg        500 mg 100 mL/hr over 60 Minutes Intravenous  Once 04/30/21 1845 04/30/21 2057        MEDICATIONS: Scheduled Meds:  amLODipine  5 mg Oral Daily   aspirin  81 mg Per Tube Daily  atorvastatin  40 mg Per Tube Daily   B-complex with vitamin C  1 tablet Per Tube Daily   bethanechol  10 mg Per Tube TID   chlorhexidine  15 mL Mouth Rinse BID   Chlorhexidine Gluconate Cloth  6 each Topical Daily   feeding supplement (PROSource TF)  45 mL Per Tube BID   free water  200 mL Per Tube Q4H   insulin aspart  0-9 Units Subcutaneous Q4H   mouth rinse  15 mL Mouth Rinse q12n4p   pantoprazole sodium  40 mg Per Tube Daily   Continuous Infusions:  sodium chloride 100 mL/hr at  05/03/21 0424   feeding supplement (JEVITY 1.2 CAL) 55 mL/hr at 05/02/21 2204   magnesium sulfate bolus IVPB 2 g (05/03/21 1139)   piperacillin-tazobactam (ZOSYN)  IV 3.375 g (05/03/21 0611)   potassium PHOSPHATE IVPB (in mmol)     PRN Meds:.docusate, fentaNYL (SUBLIMAZE) injection, polyethylene glycol, polyvinyl alcohol   I have personally reviewed following labs and imaging studies  LABORATORY DATA: CBC: Recent Labs  Lab 04/30/21 1834 04/30/21 2130 05/01/21 0019 05/01/21 0619 05/02/21 0248 05/03/21 0245  WBC 9.7  --  10.8* 10.9* 6.9 5.0  NEUTROABS 8.4*  --   --   --  5.8 3.3  HGB 16.4* 13.3 13.3 14.1 12.2 12.7  HCT 53.1* 39.0 44.3 46.3* 39.3 40.5  MCV 95.5  --  96.7 94.7 94.2 91.6  PLT 201  --  148* 128* 91* 77*    Basic Metabolic Panel: Recent Labs  Lab 04/30/21 1834 04/30/21 2130 04/30/21 2235 05/01/21 0019 05/01/21 0619 05/01/21 1211 05/02/21 0248 05/02/21 1916 05/03/21 0245  NA 154* 156*  --   --  152* 150* 148*  --  142  K 3.2* 3.1*  --   --  3.5 3.7 3.3*  --  3.0*  CL 110  --   --   --  114* 113* 113*  --  107  CO2 20*  --   --   --  20* 20* 27  --  28  GLUCOSE 123*  --   --   --  96 97 173*  --  204*  BUN 18  --   --   --  11 12 12   --  13  CREATININE 1.94*  --   --  1.35* 1.24* 1.02* 0.86  --  0.60  CALCIUM 9.6  --   --   --  8.9 8.6* 8.4*  --  7.7*  MG  --   --  1.6*  --  1.6*  --  2.5*  --  1.8  PHOS  --   --   --   --  2.8  --  1.4* 3.1 1.5*    GFR: Estimated Creatinine Clearance: 78.2 mL/min (by C-G formula based on SCr of 0.6 mg/dL).  Liver Function Tests: Recent Labs  Lab 04/30/21 1834 05/01/21 1633  AST 35  --   ALT 15  --   ALKPHOS 49  --   BILITOT 1.2  --   PROT 8.2* 6.7  ALBUMIN 3.1*  --    No results for input(s): LIPASE, AMYLASE in the last 168 hours. Recent Labs  Lab 05/01/21 0619  AMMONIA 38*    Coagulation Profile: Recent Labs  Lab 04/30/21 1834  INR 1.2    Cardiac Enzymes: No results for input(s): CKTOTAL,  CKMB, CKMBINDEX, TROPONINI in the last 168 hours.  BNP (last 3 results) No results for input(s): PROBNP in  the last 8760 hours.  Lipid Profile: No results for input(s): CHOL, HDL, LDLCALC, TRIG, CHOLHDL, LDLDIRECT in the last 72 hours.  Thyroid Function Tests: Recent Labs    04/30/21 2235 05/01/21 0838  TSH 0.092*  --   T4TOTAL 3.8*  --   FREET4  --  0.62    Anemia Panel: Recent Labs    05/02/21 1335  VITAMINB12 1,831*  FOLATE 6.2    Urine analysis:    Component Value Date/Time   COLORURINE AMBER (A) 04/30/2021 1926   APPEARANCEUR HAZY (A) 04/30/2021 1926   LABSPEC 1.021 04/30/2021 1926   PHURINE 6.0 04/30/2021 1926   GLUCOSEU NEGATIVE 04/30/2021 1926   HGBUR SMALL (A) 04/30/2021 1926   BILIRUBINUR NEGATIVE 04/30/2021 1926   KETONESUR 20 (A) 04/30/2021 1926   PROTEINUR 100 (A) 04/30/2021 1926   UROBILINOGEN 1.0 06/17/2012 1909   NITRITE NEGATIVE 04/30/2021 1926   LEUKOCYTESUR TRACE (A) 04/30/2021 1926    Sepsis Labs: Lactic Acid, Venous    Component Value Date/Time   LATICACIDVEN 5.9 (HH) 05/01/2021 1211    MICROBIOLOGY: Recent Results (from the past 240 hour(s))  Resp Panel by RT-PCR (Flu A&B, Covid) Urine, In & Out Cath     Status: None   Collection Time: 04/30/21  6:36 PM   Specimen: Urine, In & Out Cath; Nasopharyngeal(NP) swabs in vial transport medium  Result Value Ref Range Status   SARS Coronavirus 2 by RT PCR NEGATIVE NEGATIVE Final    Comment: (NOTE) SARS-CoV-2 target nucleic acids are NOT DETECTED.  The SARS-CoV-2 RNA is generally detectable in upper respiratory specimens during the acute phase of infection. The lowest concentration of SARS-CoV-2 viral copies this assay can detect is 138 copies/mL. A negative result does not preclude SARS-Cov-2 infection and should not be used as the sole basis for treatment or other patient management decisions. A negative result may occur with  improper specimen collection/handling, submission of specimen  other than nasopharyngeal swab, presence of viral mutation(s) within the areas targeted by this assay, and inadequate number of viral copies(<138 copies/mL). A negative result must be combined with clinical observations, patient history, and epidemiological information. The expected result is Negative.  Fact Sheet for Patients:  EntrepreneurPulse.com.au  Fact Sheet for Healthcare Providers:  IncredibleEmployment.be  This test is no t yet approved or cleared by the [place] FDA and  has been authorized for detection and/or diagnosis of SARS-CoV-2 by FDA under an Emergency Use Authorization (EUA). This EUA will remain  in effect (meaning this test can be used) for the duration of the COVID-19 declaration under Section 564(b)(1) of the Act, 21 U.S.C.section 360bbb-3(b)(1), unless the authorization is terminated  or revoked sooner.       Influenza A by PCR NEGATIVE NEGATIVE Final   Influenza B by PCR NEGATIVE NEGATIVE Final    Comment: (NOTE) The Xpert Xpress SARS-CoV-2/FLU/RSV plus assay is intended as an aid in the diagnosis of influenza from Nasopharyngeal swab specimens and should not be used as a sole basis for treatment. Nasal washings and aspirates are unacceptable for Xpert Xpress SARS-CoV-2/FLU/RSV testing.  Fact Sheet for Patients: EntrepreneurPulse.com.au  Fact Sheet for Healthcare Providers: IncredibleEmployment.be  This test is not yet approved or cleared by the [place] FDA and has been authorized for detection and/or diagnosis of SARS-CoV-2 by FDA under an Emergency Use Authorization (EUA). This EUA will remain in effect (meaning this test can be used) for the duration of the COVID-19 declaration under Section 564(b)(1) of the Act, 21 U.S.C.  section 360bbb-3(b)(1), unless the authorization is terminated or revoked.  Performed at [facility], [place] [address] [place],  [place] [address]   Urine Culture     Status: None   Collection Time: 04/30/21  6:44 PM   Specimen: In/Out Cath Urine  Result Value Ref Range Status   Specimen Description IN/OUT CATH URINE  Final   Special Requests NONE  Final   Culture   Final    NO GROWTH Performed at [facility], [place] [address] [place], [place] [address]    Report Status 05/02/2021 FINAL  Final  Culture, blood (Routine x 2)     Status: None (Preliminary result)   Collection Time: 04/30/21  6:55 PM   Specimen: BLOOD  Result Value Ref Range Status   Specimen Description BLOOD LEFT ANTECUBITAL  Final   Special Requests   Final    BOTTLES DRAWN AEROBIC AND ANAEROBIC Blood Culture adequate volume   Culture   Final    NO GROWTH 3 DAYS Performed at [facility], [place] [address], [place], [place] [address]    Report Status PENDING  Incomplete  MRSA Next Gen by PCR, Nasal     Status: None   Collection Time: 05/01/21  4:14 AM   Specimen: Nasal Mucosa; Nasal Swab  Result Value Ref Range Status   MRSA by PCR Next Gen NOT DETECTED NOT DETECTED Final    Comment: (NOTE) The GeneXpert MRSA Assay (FDA approved for NASAL specimens only), is one component of a comprehensive MRSA colonization surveillance program. It is not intended to diagnose MRSA infection nor to guide or monitor treatment for MRSA infections. Test performance is not FDA approved in patients less than 36 years old. Performed at [facility], [place] [address], [place], [place] [address]   Acid Fast Smear (AFB)     Status: None   Collection Time: 05/01/21  4:13 PM   Specimen: Pleural  Result Value Ref Range Status   AFB Specimen Processing Concentration  Final   Acid Fast Smear Negative  Final    Comment: (NOTE) Performed At: [facility] [place], [place] [id number] Rush Farmer MD [id number]    Source (AFB) FLUID  Final    Comment: Performed at [facility], [place] [address] [place], [place] [address]   Body fluid culture w Gram Stain     Status: None (Preliminary result)   Collection Time: 05/01/21  4:21 PM   Specimen: Pleural Fluid  Result Value Ref Range Status   Specimen Description PLEURAL  Final   Special Requests NONE  Final   Gram Stain   Final    RARE WBC PRESENT, PREDOMINANTLY MONONUCLEAR NO ORGANISMS SEEN    Culture   Final    NO GROWTH 2 DAYS Performed at [facility], [place] [address] [place], [place] [address]    Report Status PENDING  Incomplete    RADIOLOGY STUDIES/RESULTS: US RENAL  Result Date: 05/01/2021 CLINICAL DATA:  Acute kidney injury EXAM: RENAL / URINARY TRACT ULTRASOUND COMPLETE COMPARISON:  CT 04/02/2021 FINDINGS: Right Kidney: Renal measurements: 11.5 x 5.0 x 5.8 cm = volume: 173.8 mL. Mildly increased renal cortical echogenicity. No hydronephrosis. Left Kidney: Renal measurements: 10.0 x 5.5 x 3.45 cm = volume: 96.3 mL. Mildly increased renal cortical echogenicity. No hydronephrosis. Bladder: Mildly distended.  Foley catheter in place. Other: None. IMPRESSION: No hydronephrosis. Mildly increased renal cortical echogenicity as can be seen in medical renal  disease. Electronically Signed   By: Maurine Simmering M.D.   On: 05/01/2021 16:14   DG CHEST PORT 1 VIEW  Result Date: 05/03/2021 CLINICAL DATA:  Right pleural effusion. EXAM: PORTABLE CHEST 1 VIEW COMPARISON:  May 02, 2021. FINDINGS: Stable cardiomediastinal silhouette. Status post coronary bypass graft. Feeding tube is seen entering stomach. Mild right basilar atelectasis or infiltrate is noted with small right pleural effusion. Minimal left basilar subsegmental atelectasis is noted. Bony thorax is unremarkable. IMPRESSION: Mild right basilar atelectasis or infiltrate is noted with small right pleural effusion. Electronically Signed   By: Marijo Conception M.D.   On: 05/03/2021 06:52   DG CHEST PORT 1 VIEW  Result Date: 05/02/2021 CLINICAL DATA:  Pleural effusion EXAM: PORTABLE CHEST 1 VIEW  COMPARISON:  Chest radiograph 1 day prior FINDINGS: The enteric catheter courses below the diaphragm and off the field of view. Median sternotomy wires and mediastinal surgical clips are stable. The cardiomediastinal silhouette is stable. There is a trace right pleural effusion, not significantly changed. There is no significant left effusion. There is no overt pulmonary edema. There is no new or worsening focal airspace disease. There is no pneumothorax. The bones are stable. IMPRESSION: Trace right pleural effusion, not significantly changed. Overall, no significant interval change with no new or worsening focal airspace disease. Electronically Signed   By: Valetta Mole M.D.   On: 05/02/2021 08:14   DG CHEST PORT 1 VIEW  Result Date: 05/01/2021 CLINICAL DATA:  Status post right sided thoracentesis. EXAM: PORTABLE CHEST 1 VIEW COMPARISON:  Chest radiograph dated April 30, 2021 FINDINGS: The heart is enlarged. Evidence of prior CABG. Atherosclerotic calcification of the aortic arch. Interval decrease in the right pleural effusion. Right basilar atelectasis and trace effusion. Left lung is clear. No appreciable pneumothorax. Interval placement of the feeding tube with distal tip below the diaphragm but not included on this image. IMPRESSION: 1. Status post thoracentesis with interval decrease in right pleural effusion. Minimal right basilar atelectasis. No appreciable pneumothorax. 2. Interval placement of feeding tube with distal tip below the diaphragm, but not included on this image. 3.  No other significant interval change. Electronically Signed   By: Keane Police D.O.   On: 05/01/2021 16:39   DG Abd Portable 1V  Result Date: 05/01/2021 CLINICAL DATA:  NG tube placement EXAM: PORTABLE ABDOMEN - 1 VIEW COMPARISON:  07/05/2020 FINDINGS: Bowel gas pattern is nonspecific. No abnormal masses or calcifications are seen. Pelvis is not included in its entirety. Tip of enteric tube is seen in the antrum of the  stomach. Metallic sutures are seen in the sternum. Heart is enlarged in size. IMPRESSION: Tip of enteric tube is seen in the antrum of the stomach. Bowel gas pattern is nonspecific. Electronically Signed   By: Elmer Picker M.D.   On: 05/01/2021 14:51     LOS: 2 days   Oren Binet, MD  [facility]    To contact the attending provider between 7A-7P or the covering provider during after hours 7P-7A, please log into the web site [email or web address] and access using universal  password for that web site. If you do not have the password, please call the hospital operator.  05/03/2021, 12:19 PM

## 2021-05-03 NOTE — Progress Notes (Signed)
Patient heart rhythm showing A. Flutter. MD notified

## 2021-05-03 NOTE — Progress Notes (Signed)
Pt refused 2000 insulin and 2200 heparin administrations. Pt saying "NO" to both injections and not allowing RN to administer injections despite explanation of medications and their purpose. Pt also removed bilateral mitts and began pulling at NGT and stating "I want this off of my face". RN explained purpose of mitts and NGT. Pt continues to refuse care.

## 2021-05-04 ENCOUNTER — Other Ambulatory Visit: Payer: Self-pay

## 2021-05-04 DIAGNOSIS — R410 Disorientation, unspecified: Secondary | ICD-10-CM | POA: Diagnosis not present

## 2021-05-04 DIAGNOSIS — L8990 Pressure ulcer of unspecified site, unspecified stage: Secondary | ICD-10-CM

## 2021-05-04 DIAGNOSIS — E440 Moderate protein-calorie malnutrition: Secondary | ICD-10-CM

## 2021-05-04 LAB — COMPREHENSIVE METABOLIC PANEL WITH GFR
ALT: 57 U/L — ABNORMAL HIGH (ref 0–44)
AST: 102 U/L — ABNORMAL HIGH (ref 15–41)
Albumin: 1.9 g/dL — ABNORMAL LOW (ref 3.5–5.0)
Alkaline Phosphatase: 75 U/L (ref 38–126)
Anion gap: 9 (ref 5–15)
BUN: 8 mg/dL (ref 8–23)
CO2: 25 mmol/L (ref 22–32)
Calcium: 7.5 mg/dL — ABNORMAL LOW (ref 8.9–10.3)
Chloride: 106 mmol/L (ref 98–111)
Creatinine, Ser: 0.56 mg/dL (ref 0.44–1.00)
GFR, Estimated: >60 mL/min (ref >60)
Glucose, Bld: 268 mg/dL — ABNORMAL HIGH (ref 70–99)
Potassium: 3.4 mmol/L — ABNORMAL LOW (ref 3.5–5.1)
Sodium: 140 mmol/L (ref 135–145)
Total Bilirubin: 0.9 mg/dL (ref 0.3–1.2)
Total Protein: 5 g/dL — ABNORMAL LOW (ref 6.5–8.1)

## 2021-05-04 LAB — CBC
HCT: 35.7 % — ABNORMAL LOW (ref 36.0–46.0)
Hemoglobin: 11.4 g/dL — ABNORMAL LOW (ref 12.0–15.0)
MCH: 28.8 pg (ref 26.0–34.0)
MCHC: 31.9 g/dL (ref 30.0–36.0)
MCV: 90.2 fL (ref 80.0–100.0)
Platelets: 75 K/uL — ABNORMAL LOW (ref 150–400)
RBC: 3.96 MIL/uL (ref 3.87–5.11)
RDW: 17.2 % — ABNORMAL HIGH (ref 11.5–15.5)
WBC: 4.5 K/uL (ref 4.0–10.5)
nRBC: 0 % (ref 0.0–0.2)

## 2021-05-04 LAB — GLUCOSE, CAPILLARY
Glucose-Capillary: 263 mg/dL — ABNORMAL HIGH (ref 70–99)
Glucose-Capillary: 192 mg/dL — ABNORMAL HIGH (ref 70–99)
Glucose-Capillary: 169 mg/dL — ABNORMAL HIGH (ref 70–99)
Glucose-Capillary: 183 mg/dL — ABNORMAL HIGH (ref 70–99)
Glucose-Capillary: 221 mg/dL — ABNORMAL HIGH (ref 70–99)

## 2021-05-04 LAB — VITAMIN B6: Vitamin B6: 6.8 ug/L (ref 3.4–65.2)

## 2021-05-04 LAB — TOTAL BILIRUBIN, BODY FLUID: Total bilirubin, fluid: 0.4 mg/dL

## 2021-05-04 LAB — MAGNESIUM: Magnesium: 2 mg/dL (ref 1.7–2.4)

## 2021-05-04 LAB — PHOSPHORUS: Phosphorus: 1.1 mg/dL — ABNORMAL LOW (ref 2.5–4.6)

## 2021-05-04 MED ORDER — POTASSIUM CHLORIDE CRYS ER 20 MEQ PO TBCR
40.0000 meq | EXTENDED_RELEASE_TABLET | Freq: Once | ORAL | Status: DC
Start: 2021-05-04 — End: 2021-05-04

## 2021-05-04 MED ORDER — POTASSIUM CHLORIDE 10 MEQ/100ML IV SOLN
10.0000 meq | INTRAVENOUS | Status: AC
  Administered 2021-05-04 (×3): 10 meq via INTRAVENOUS
  Filled 2021-05-04 (×3): qty 100

## 2021-05-04 MED ORDER — POTASSIUM CHLORIDE 10 MEQ/100ML IV SOLN: 10.0000 meq | INTRAVENOUS | Status: DC

## 2021-05-04 MED ORDER — K PHOS MONO-SOD PHOS DI & MONO 155-852-130 MG PO TABS
500.0000 mg | ORAL_TABLET | Freq: Four times a day (QID) | ORAL | Status: AC
  Administered 2021-05-04 (×4): 500 mg via ORAL
  Filled 2021-05-04 (×4): qty 2

## 2021-05-04 MED ORDER — PNEUMOCOCCAL VAC POLYVALENT 25 MCG/0.5ML IJ INJ
0.5000 mL | INJECTION | INTRAMUSCULAR | Status: AC
  Administered 2021-05-06: 0.5 mL via INTRAMUSCULAR
  Filled 2021-05-04: qty 0.5

## 2021-05-04 NOTE — Progress Notes (Signed)
PROGRESS NOTE        PATIENT DETAILS Name: [name] [name] Age: 61 y.o. Sex: female Date of Birth: [birth year] Admit Date: 04/30/2021 Admitting Physician Collier Bullock, MD [id number], Ronie Spies, MD  Brief Summary: Patient is a 61 y.o.  female with history of psoriatic arthritis, DM-2, HTN, CAD s/p CABG-presented to the hospital on 2/14 with confusion, hypotension, fever-she was found to have septic shock due to empyema-monitor closely in the [place] thoracocentesis-improved-and subsequently transferred to the [facility] service.  See below for further details.  Significant Hospital events: 2/14>> admit to ICU-confused/hypotensive/febrile-severe sepsis/septic shock-from empyema.  Numerous electrolyte abnormalities. 2/15>> thoracocentesis 2/17>> transfer to [facility]  Significant imaging studies: 2/14>> CT head: No acute abnormality 2/14>> CXR: Small volume right pleural effusion 2/15>> renal ultrasound: No hydronephrosis 2/15>> Echo: EF 62%, grade 1 diastolic dysfunction 3/76>> CXR: Mild right basilar atelectasis/infiltrate with small right pleural effusion.  Significant microbiology data: 2/14>> COVID/influenza PCR: Negative 2/14>> blood culture: Negative 2/14>> urine culture: Negative 2/15>> pleural fluid culture: No growth 2/15>> pleural fluid AFB smear: Negative 2/15>> pleural fluid AFB culture: Pending  Procedures: 2/15>> thoracocentesis  Consults: PCCM  Subjective: No acute issues or events overnight, able to answer simple questions appropriately.  Has NG tube in place.  Objective: Vitals: Blood pressure (!) 153/86, pulse 91, temperature 98 F (36.7 C), temperature source Axillary, resp. rate 12, height 5\' 6"  (1.676 m), weight 79.2 kg, SpO2 99 %.   Exam: Gen Exam:Alert awake-not in any distress.  Frail appearing. HEENT:atraumatic, normocephalic Chest: B/L clear to auscultation anteriorly CVS:S1S2 regular Abdomen:soft non tender, non  distended Extremities:no edema Neurology: Non focal-generalized weakness. Skin: no rash  Pertinent Labs/Radiology: CBC Latest Ref Rng & Units 05/04/2021 05/03/2021 05/02/2021  WBC 4.0 - 10.5 K/uL 4.5 5.0 6.9  Hemoglobin 12.0 - 15.0 g/dL 11.4(L) 12.7 12.2  Hematocrit 36.0 - 46.0 % 35.7(L) 40.5 39.3  Platelets 150 - 400 K/uL 75(L) 77(L) 91(L)    Lab Results  Component Value Date   NA 140 05/04/2021   K 3.4 (L) 05/04/2021   CL 106 05/04/2021   CO2 25 05/04/2021    Assessment/Plan:  Septic shock due to empyema-s/p thoracocentesis on 2/15, resolving:  -Sepsis physiology has significantly improved-cultures negative so far-on IV Zosyn.  No reaccumulation on CXR done on 2/17-no further recommendations from PCCM.  Acute metabolic encephalopathy, resolving: Due to sepsis/AKI/hypernatremia-remarkably better-suspect will continue to improve.  CT head was negative for acute abnormalities.  AKI: Likely hemodynamically mediated-resolved with supportive care.  Hypernatremia: Resolved-stop hypertonic saline.  Continue tube feeds and see how she does.  Hypokalemia: Replete and recheck.  Hypophosphatemia: Replete and recheck.  Watch closely for signs of refeeding syndrome.  HTN: Continue amlodipine NIDDM-2: CBG stable with SSI-resume oral hypoglycemic agents on discharge.  CAD s/p CABG in 2017 and PCI/DES to RCA 2017: Troponin minimally elevated in the setting of demand ischemia/sepsis  Combined chronic systolic/diastolic heart failure: Volume status stable-no role for diuretics currently.  Dysphagia: Suspect due to acute illness related debility/deconditioning-reevaluated by SLP on 2/17-starting dysphagia 2 diet-continue NG tube feedings for now. Continue to advance diet as tolerated, NG tube to maintain caloric and volume status but once p.o. intake improves appropriately will discontinue NG  Acute urinary retention:  Secondary y to acute illness debility/deconditioning Continue Foley  catheter-voiding trial in the next few days.  Remains on Urecholine.  Thrombocytopenia:  Likely  in the setting of acute illness Heparin previously discontinued We will follow HIT antibodies (pharmacy evaluation order per protocol)  GERD: Continue PPI  History of psoriatic arthritis: Follows with rheumatology in the outpatient setting.  Debility/deconditioning/functional quadriplegia: Due to acute illness-await PT/OT eval-suspect will require SNF.  Nutrition Status: Nutrition Problem: Moderate Malnutrition Etiology: chronic illness, dysphagia Signs/Symptoms: moderate muscle depletion, moderate fat depletion, percent weight loss (15% weight loss within 6 months) Percent weight loss: 15 % Interventions: Tube feeding, MVI  BMI: Estimated body mass index is 28.18 kg/m as calculated from the following:   Height as of this encounter: 5\' 6"  (1.676 m).   Weight as of this encounter: 79.2 kg.   Code status:   Code Status: Full Code   DVT Prophylaxis: SCDs Start: 05/01/21 0019   Family Communication: Son [name]-updated over the phone on 2/17   Disposition Plan: Status is: Inpatient Remains inpatient appropriate because: Resolving sepsis physiology/resolving encephalopathy-on IV antibiotics-needs several more days of hospitalization to ensure stability and appropriate discharge disposition.  Probably SNF on discharge.    Planned Discharge Destination:Skilled nursing facility   Diet: Diet Order             DIET DYS 2 Room service appropriate? Yes; Fluid consistency: Thin  Diet effective now                     Antimicrobial agents: Anti-infectives (From admission, onward)    Start     Dose/Rate Route Frequency Ordered Stop   05/02/21 1800  piperacillin-tazobactam (ZOSYN) IVPB 3.375 g        3.375 g 12.5 mL/hr over 240 Minutes Intravenous Every 8 hours 05/02/21 1220 05/08/21 2159   05/01/21 2200  ceFEPIme (MAXIPIME) 2 g in sodium chloride 0.9 % 100 mL  IVPB  Status:  Discontinued        2 g 200 mL/hr over 30 Minutes Intravenous Every 12 hours 05/01/21 0735 05/01/21 1722   05/01/21 2100  vancomycin (VANCOREADY) IVPB 1500 mg/300 mL  Status:  Discontinued        1,500 mg 150 mL/hr over 120 Minutes Intravenous Every 24 hours 04/30/21 1956 05/01/21 1009   05/01/21 1800  piperacillin-tazobactam (ZOSYN) IVPB 3.375 g  Status:  Discontinued        3.375 g 12.5 mL/hr over 240 Minutes Intravenous Every 8 hours 05/01/21 1722 05/02/21 1220   04/30/21 2000  ceFEPIme (MAXIPIME) 2 g in sodium chloride 0.9 % 100 mL IVPB  Status:  Discontinued        2 g 200 mL/hr over 30 Minutes Intravenous Every 8 hours 04/30/21 1923 05/01/21 0735   04/30/21 1915  vancomycin (VANCOREADY) IVPB 1750 mg/350 mL        1,750 mg 175 mL/hr over 120 Minutes Intravenous  Once 04/30/21 1905 04/30/21 2228   04/30/21 1900  metroNIDAZOLE (FLAGYL) IVPB 500 mg        500 mg 100 mL/hr over 60 Minutes Intravenous  Once 04/30/21 1845 04/30/21 2057        MEDICATIONS: Scheduled Meds:  amLODipine  5 mg Per Tube Daily   aspirin  81 mg Per Tube Daily   atorvastatin  40 mg Per Tube Daily   B-complex with vitamin C  1 tablet Per Tube Daily   bethanechol  10 mg Per Tube TID   chlorhexidine  15 mL Mouth Rinse BID   Chlorhexidine Gluconate Cloth  6 each Topical Daily   feeding supplement (PROSource TF)  45 mL Per Tube  BID   free water  200 mL Per Tube Q4H   insulin aspart  0-9 Units Subcutaneous Q4H   mouth rinse  15 mL Mouth Rinse q12n4p   pantoprazole sodium  40 mg Per Tube Daily   phosphorus  500 mg Oral QID   [START ON 05/05/2021] pneumococcal 23 valent vaccine  0.5 mL Intramuscular Tomorrow-1000   Continuous Infusions:  feeding supplement (JEVITY 1.2 CAL) 1,000 mL (05/04/21 0600)   piperacillin-tazobactam (ZOSYN)  IV 3.375 g (05/04/21 0627)   potassium chloride 10 mEq (05/04/21 0616)   PRN Meds:.docusate, fentaNYL (SUBLIMAZE) injection, polyethylene glycol, polyvinyl  alcohol   I have personally reviewed following labs and imaging studies  LABORATORY DATA: CBC: Recent Labs  Lab 04/30/21 1834 04/30/21 2130 05/01/21 0019 05/01/21 0619 05/02/21 0248 05/03/21 0245 05/04/21 0323  WBC 9.7  --  10.8* 10.9* 6.9 5.0 4.5  NEUTROABS 8.4*  --   --   --  5.8 3.3  --   HGB 16.4*   < > 13.3 14.1 12.2 12.7 11.4*  HCT 53.1*   < > 44.3 46.3* 39.3 40.5 35.7*  MCV 95.5  --  96.7 94.7 94.2 91.6 90.2  PLT 201  --  148* 128* 91* 77* 75*   < > = values in this interval not displayed.     Basic Metabolic Panel: Recent Labs  Lab 04/30/21 2235 05/01/21 0019 05/01/21 0160 05/01/21 1211 05/02/21 0248 05/02/21 1916 05/03/21 0245 05/04/21 0323  NA  --   --  152* 150* 148*  --  142 140  K  --   --  3.5 3.7 3.3*  --  3.0* 3.4*  CL  --   --  114* 113* 113*  --  107 106  CO2  --   --  20* 20* 27  --  28 25  GLUCOSE  --   --  96 97 173*  --  204* 268*  BUN  --   --  11 12 12   --  13 8  CREATININE  --    < > 1.24* 1.02* 0.86  --  0.60 0.56  CALCIUM  --   --  8.9 8.6* 8.4*  --  7.7* 7.5*  MG 1.6*  --  1.6*  --  2.5*  --  1.8 2.0  PHOS  --   --  2.8  --  1.4* 3.1 1.5* 1.1*   < > = values in this interval not displayed.     GFR: Estimated Creatinine Clearance: 77.5 mL/min (by C-G formula based on SCr of 0.56 mg/dL).  Liver Function Tests: Recent Labs  Lab 04/30/21 1834 05/01/21 1633 05/04/21 0323  AST 35  --  102*  ALT 15  --  57*  ALKPHOS 49  --  75  BILITOT 1.2  --  0.9  PROT 8.2* 6.7 5.0*  ALBUMIN 3.1*  --  1.9*    No results for input(s): LIPASE, AMYLASE in the last 168 hours. Recent Labs  Lab 05/01/21 0619  AMMONIA 38*     Coagulation Profile: Recent Labs  Lab 04/30/21 1834  INR 1.2     Cardiac Enzymes: No results for input(s): CKTOTAL, CKMB, CKMBINDEX, TROPONINI in the last 168 hours.  BNP (last 3 results) No results for input(s): PROBNP in the last 8760 hours.  Lipid Profile: No results for input(s): CHOL, HDL, LDLCALC,  TRIG, CHOLHDL, LDLDIRECT in the last 72 hours.  Thyroid Function Tests: Recent Labs    05/01/21 [place]  0.62     Anemia Panel: Recent Labs    05/02/21 1335  VITAMINB12 1,831*  FOLATE 6.2     Urine analysis:    Component Value Date/Time   COLORURINE AMBER (A) 04/30/2021 1926   APPEARANCEUR HAZY (A) 04/30/2021 1926   LABSPEC 1.021 04/30/2021 1926   PHURINE 6.0 04/30/2021 1926   GLUCOSEU NEGATIVE 04/30/2021 1926   HGBUR SMALL (A) 04/30/2021 1926   BILIRUBINUR NEGATIVE 04/30/2021 1926   KETONESUR 20 (A) 04/30/2021 1926   PROTEINUR 100 (A) 04/30/2021 1926   UROBILINOGEN 1.0 06/17/2012 1909   NITRITE NEGATIVE 04/30/2021 1926   LEUKOCYTESUR TRACE (A) 04/30/2021 1926    Sepsis Labs: Lactic Acid, Venous    Component Value Date/Time   LATICACIDVEN 5.9 (HH) 05/01/2021 1211    MICROBIOLOGY: Recent Results (from the past 240 hour(s))  Resp Panel by RT-PCR (Flu A&B, Covid) Urine, In & Out Cath     Status: None   Collection Time: 04/30/21  6:36 PM   Specimen: Urine, In & Out Cath; Nasopharyngeal(NP) swabs in vial transport medium  Result Value Ref Range Status   SARS Coronavirus 2 by RT PCR NEGATIVE NEGATIVE Final    Comment: (NOTE) SARS-CoV-2 target nucleic acids are NOT DETECTED.  The SARS-CoV-2 RNA is generally detectable in upper respiratory specimens during the acute phase of infection. The lowest concentration of SARS-CoV-2 viral copies this assay can detect is 138 copies/mL. A negative result does not preclude SARS-Cov-2 infection and should not be used as the sole basis for treatment or other patient management decisions. A negative result may occur with  improper specimen collection/handling, submission of specimen other than nasopharyngeal swab, presence of viral mutation(s) within the areas targeted by this assay, and inadequate number of viral copies(<138 copies/mL). A negative result must be combined with clinical observations, patient history, and  epidemiological information. The expected result is Negative.  Fact Sheet for Patients:  EntrepreneurPulse.com.au  Fact Sheet for Healthcare Providers:  IncredibleEmployment.be  This test is no t yet approved or cleared by the [place] FDA and  has been authorized for detection and/or diagnosis of SARS-CoV-2 by FDA under an Emergency Use Authorization (EUA). This EUA will remain  in effect (meaning this test can be used) for the duration of the COVID-19 declaration under Section 564(b)(1) of the Act, 21 U.S.C.section 360bbb-3(b)(1), unless the authorization is terminated  or revoked sooner.       Influenza A by PCR NEGATIVE NEGATIVE Final   Influenza B by PCR NEGATIVE NEGATIVE Final    Comment: (NOTE) The Xpert Xpress SARS-CoV-2/FLU/RSV plus assay is intended as an aid in the diagnosis of influenza from Nasopharyngeal swab specimens and should not be used as a sole basis for treatment. Nasal washings and aspirates are unacceptable for Xpert Xpress SARS-CoV-2/FLU/RSV testing.  Fact Sheet for Patients: EntrepreneurPulse.com.au  Fact Sheet for Healthcare Providers: IncredibleEmployment.be  This test is not yet approved or cleared by the [place] FDA and has been authorized for detection and/or diagnosis of SARS-CoV-2 by FDA under an Emergency Use Authorization (EUA). This EUA will remain in effect (meaning this test can be used) for the duration of the COVID-19 declaration under Section 564(b)(1) of the Act, 21 U.S.C. section 360bbb-3(b)(1), unless the authorization is terminated or revoked.  Performed at [facility], [place] [address], [place], [place] [address]   Urine Culture     Status: None   Collection Time: 04/30/21  6:44 PM   Specimen: In/Out Cath Urine  Result Value  Ref Range Status   Specimen Description IN/OUT CATH URINE  Final   Special Requests NONE  Final   Culture   Final     NO GROWTH Performed at [facility], [address] [place], [place] [address]    Report Status 05/02/2021 FINAL  Final  Culture, blood (Routine x 2)     Status: None (Preliminary result)   Collection Time: 04/30/21  6:55 PM   Specimen: BLOOD  Result Value Ref Range Status   Specimen Description BLOOD LEFT ANTECUBITAL  Final   Special Requests   Final    BOTTLES DRAWN AEROBIC AND ANAEROBIC Blood Culture adequate volume   Culture   Final    NO GROWTH 3 DAYS Performed at [facility], [place] [address], [place], [place] [address]    Report Status PENDING  Incomplete  MRSA Next Gen by PCR, Nasal     Status: None   Collection Time: 05/01/21  4:14 AM   Specimen: Nasal Mucosa; Nasal Swab  Result Value Ref Range Status   MRSA by PCR Next Gen NOT DETECTED NOT DETECTED Final    Comment: (NOTE) The GeneXpert MRSA Assay (FDA approved for NASAL specimens only), is one component of a comprehensive MRSA colonization surveillance program. It is not intended to diagnose MRSA infection nor to guide or monitor treatment for MRSA infections. Test performance is not FDA approved in patients less than 37 years old. Performed at [facility], [place] [address] [place], [place] [address]   Acid Fast Smear (AFB)     Status: None   Collection Time: 05/01/21  4:13 PM   Specimen: Pleural  Result Value Ref Range Status   AFB Specimen Processing Concentration  Final   Acid Fast Smear Negative  Final    Comment: (NOTE) Performed At: [facility] [place], [place] [id number] Rush Farmer MD VO:[id number]    Source (AFB) FLUID  Final    Comment: Performed at [facility], [place] [address], [place], [place] [address]  Body fluid culture w Gram Stain     Status: None (Preliminary result)   Collection Time: 05/01/21  4:21 PM   Specimen: Pleural Fluid  Result Value Ref Range Status   Specimen Description PLEURAL  Final   Special Requests NONE  Final   Gram  Stain   Final    RARE WBC PRESENT, PREDOMINANTLY MONONUCLEAR NO ORGANISMS SEEN    Culture   Final    NO GROWTH 2 DAYS Performed at [facility], [place] [address], [place], [place] [address]    Report Status PENDING  Incomplete    RADIOLOGY STUDIES/RESULTS: DG CHEST PORT 1 VIEW  Result Date: 05/03/2021 CLINICAL DATA:  Right pleural effusion. EXAM: PORTABLE CHEST 1 VIEW COMPARISON:  May 02, 2021. FINDINGS: Stable cardiomediastinal silhouette. Status post coronary bypass graft. Feeding tube is seen entering stomach. Mild right basilar atelectasis or infiltrate is noted with small right pleural effusion. Minimal left basilar subsegmental atelectasis is noted. Bony thorax is unremarkable. IMPRESSION: Mild right basilar atelectasis or infiltrate is noted with small right pleural effusion. Electronically Signed   By: Marijo Conception M.D.   On: 05/03/2021 06:52     LOS: 3 days   Little Ishikawa, DO  [facility]    To contact the attending provider between 7A-7P or the covering provider during after hours 7P-7A, please log into the web site [email or web address] and access using universal [place] password  for that web site. If you do not have the password, please call the hospital operator.  05/04/2021, 7:39 AM

## 2021-05-04 NOTE — Evaluation (Addendum)
Physical Therapy Evaluation Patient Details Name: [name] [name] MRN: [id number] DOB: 1960/07/12 Today's Date: 05/04/2021  History of Present Illness  61 yo woman with PMH significant for DM on Metformin, RA with chronic pain, HTN, HL, CAD s/p CABG, UTI, CVA, recent 30 lb weight loss and functional decline  who was found by her son 2/14 incontinent of urine and altered. CT head negative, hypernatremia, fever; encephalopathy, septic shock due to empyema with 2/15 thoracentesis  Clinical Impression   Pt admitted secondary to problem above with deficits below. PTA patient was ?living alone (per pt with ?accuracy/reliability due to AMS). Pt currently requires max assist to sit EOB and unable to stand with +1 assist due to decr sitting balance. Based on current AMS, functional level, and easily fatigued, anticipate she will need increased time in therapies to make progress.  Anticipate patient will benefit from PT to address problems listed below.Will continue to follow acutely to maximize functional mobility independence and safety.          Recommendations for follow up therapy are one component of a multi-disciplinary discharge planning process, led by the attending physician.  Recommendations may be updated based on patient status, additional functional criteria and insurance authorization.  Follow Up Recommendations Skilled nursing-short term rehab (<3 hours/day)    Assistance Recommended at Discharge Frequent or constant Supervision/Assistance  Patient can return home with the following  Two people to help with walking and/or transfers;Two people to help with bathing/dressing/bathroom;Direct supervision/assist for medications management;Assistance with cooking/housework;Assistance with feeding;Direct supervision/assist for financial management;Help with stairs or ramp for entrance    Equipment Recommendations None recommended by PT  Recommendations for Other Services  OT consult     Functional Status Assessment Patient has had a recent decline in their functional status and demonstrates the ability to make significant improvements in function in a reasonable and predictable amount of time.     Precautions / Restrictions Precautions Precautions: Fall;Other (comment) Precaution Comments: cortrak, rectal tube      Mobility  Bed Mobility Overal bed mobility: Needs Assistance Bed Mobility: Rolling, Sidelying to Sit, Sit to Sidelying Rolling: Min assist Sidelying to sit: Max assist, HOB elevated     Sit to sidelying: Max assist General bed mobility comments: pt without initiation when instructions given; incr time for all mobility    Transfers Overall transfer level: Needs assistance                Lateral/Scoot Transfers: Max assist General transfer comment: lateral scoot along EOB x 4 to [facility]    Ambulation/Gait                  Stairs            Wheelchair Mobility    Modified Rankin (Stroke Patients Only)       Balance Overall balance assessment: Needs assistance Sitting-balance support: Bilateral upper extremity supported, Feet supported Sitting balance-Leahy Scale: Poor Sitting balance - Comments: leaning anteriorly and to right Postural control: Right lateral lean                                   Pertinent Vitals/Pain Pain Assessment Pain Assessment: Faces Faces Pain Scale: No hurt    Home Living Family/patient expects to be discharged to:: Private residence                   Additional Comments: pt reports she lives alone,  however not oriented and ?reliable    Prior Function               Mobility Comments: reports she began using a rollator in last 3-4 weeks due to falls (?accuracy due to AMS)       Hand Dominance        Extremity/Trunk Assessment   Upper Extremity Assessment Upper Extremity Assessment: Defer to OT evaluation    Lower Extremity Assessment Lower Extremity  Assessment: Generalized weakness    Cervical / Trunk Assessment Cervical / Trunk Assessment: Normal  Communication   Communication: Expressive difficulties (speech difficult to understand; very slow)  Cognition Arousal/Alertness: Lethargic Behavior During Therapy: Flat affect Overall Cognitive Status: Impaired/Different from baseline Area of Impairment: Orientation, Attention, Following commands, Awareness, Problem solving                 Orientation Level: Place, Time, Situation Current Attention Level: Sustained   Following Commands: Follows one step commands inconsistently, Follows one step commands with increased time   Awareness: Intellectual Problem Solving: Slow processing, Decreased initiation, Difficulty sequencing, Requires verbal cues, Requires tactile cues General Comments: required incr time for all commands; repetition of cues in addition to multi-modal cues        General Comments      Exercises     Assessment/Plan    PT Assessment Patient needs continued PT services  PT Problem List Decreased strength;Decreased activity tolerance;Decreased balance;Decreased mobility;Decreased cognition;Decreased knowledge of use of DME;Decreased safety awareness       PT Treatment Interventions DME instruction;Gait training;Functional mobility training;Therapeutic activities;Therapeutic exercise;Balance training;Cognitive remediation;Patient/family education    PT Goals (Current goals can be found in the Care Plan section)  Acute Rehab PT Goals Patient Stated Goal: unable to state; agrees wants to get stronger PT Goal Formulation: With patient Time For Goal Achievement: 05/18/21 Potential to Achieve Goals: Fair    Frequency Min 3X/week     Co-evaluation               AM-PAC PT "6 Clicks" Mobility  Outcome Measure Help needed turning from your back to your side while in a flat bed without using bedrails?: A Lot Help needed moving from lying on your  back to sitting on the side of a flat bed without using bedrails?: Total Help needed moving to and from a bed to a chair (including a wheelchair)?: Total Help needed standing up from a chair using your arms (e.g., wheelchair or bedside chair)?: Total Help needed to walk in hospital room?: Total Help needed climbing 3-5 steps with a railing? : Total 6 Click Score: 7    End of Session   Activity Tolerance: Patient limited by fatigue Patient left: in bed;with call bell/phone within reach;with bed alarm set;with nursing/sitter in room Nurse Communication: Mobility status PT Visit Diagnosis: Muscle weakness (generalized) (M62.81);History of falling (Z91.81);Difficulty in walking, not elsewhere classified (R26.2)    Time: 1350-1416 PT Time Calculation (min) (ACUTE ONLY): 26 min   Charges:   PT Evaluation $PT Eval Moderate Complexity: 1 Mod PT Treatments $Therapeutic Activity: 8-22 mins         Arby Barrette, PT Acute Rehabilitation Services  Pager [phone] Office [phone]   Rexanne Mano 05/04/2021, 2:43 PM

## 2021-05-04 NOTE — Progress Notes (Signed)
Pharmacy Heparin Induced Thrombocytopenia (HIT) Note:  [name] [name] is an 62 y.o. female being evaluated for HIT. Heparin SQ was started 2/15 for and baseline platelets were 148. Heparin sq discontinued on 2/17. -platelet count down to 75 on 2/18 (50% drop from admission)   Auto-populate labs: No results found for: HEPINDPLTAB, SRALOWDOSEHP, SRAHIGHDOSEH    CALCULATE SCORE:  4Ts (see the HIT Algorithm) Score  Thrombocytopenia 2  Timing 0  Thrombosis 0  Other causes of thrombocytopenia 0-1 (septic shock and antibiotics)  Total 2-3     Recommendations (A or B or C) are based on available lab results:  A. No lab results available (HIT antibody and/or SRA ordered): -Low HIT probability: consider discontinuing HIT labs, continue heparin/LMWH, SRA not recommended; no heparin allergy documentation needed  B. HIT antibody result available: -N/A - HIT antibody not available, and/or SRA not available  C. SRA result available -SRA not available  Name of MD Contacted: Dr. Avon Gully  Plan  Labs ordered: -HIT labs not needed Heparin allergy: -no allergy documentation needed Anticoagulation plans -continue heparin / LMWH   Hildred Laser, PharmD Clinical Pharmacist **Pharmacist phone directory can now be found on [email or web address] ([email or web address]).  Listed under [place].

## 2021-05-05 DIAGNOSIS — L8990 Pressure ulcer of unspecified site, unspecified stage: Secondary | ICD-10-CM | POA: Diagnosis not present

## 2021-05-05 DIAGNOSIS — R410 Disorientation, unspecified: Secondary | ICD-10-CM | POA: Diagnosis not present

## 2021-05-05 DIAGNOSIS — E440 Moderate protein-calorie malnutrition: Secondary | ICD-10-CM | POA: Diagnosis not present

## 2021-05-05 LAB — CBC
HCT: 36.6 % (ref 36.0–46.0)
Hemoglobin: 11.4 g/dL — ABNORMAL LOW (ref 12.0–15.0)
MCH: 28.6 pg (ref 26.0–34.0)
MCHC: 31.1 g/dL (ref 30.0–36.0)
MCV: 91.7 fL (ref 80.0–100.0)
Platelets: 80 10*3/uL — ABNORMAL LOW (ref 150–400)
RBC: 3.99 MIL/uL (ref 3.87–5.11)
RDW: 16.9 % — ABNORMAL HIGH (ref 11.5–15.5)
WBC: 7.1 10*3/uL (ref 4.0–10.5)
nRBC: 0.3 % — ABNORMAL HIGH (ref 0.0–0.2)

## 2021-05-05 LAB — BASIC METABOLIC PANEL
Anion gap: 8 (ref 5–15)
BUN: 5 mg/dL — ABNORMAL LOW (ref 8–23)
CO2: 26 mmol/L (ref 22–32)
Calcium: 7.8 mg/dL — ABNORMAL LOW (ref 8.9–10.3)
Chloride: 104 mmol/L (ref 98–111)
Creatinine, Ser: 0.45 mg/dL (ref 0.44–1.00)
GFR, Estimated: >60 mL/min (ref >60)
Glucose, Bld: 202 mg/dL — ABNORMAL HIGH (ref 70–99)
Potassium: 3.3 mmol/L — ABNORMAL LOW (ref 3.5–5.1)
Sodium: 138 mmol/L (ref 135–145)

## 2021-05-05 LAB — MAGNESIUM: Magnesium: 1.7 mg/dL (ref 1.7–2.4)

## 2021-05-05 LAB — CULTURE, BLOOD (ROUTINE X 2)
Culture: NO GROWTH
Special Requests: ADEQUATE

## 2021-05-05 LAB — PHOSPHORUS
Phosphorus: <1 mg/dL — CL (ref 2.5–4.6)
Phosphorus: 2 mg/dL — ABNORMAL LOW (ref 2.5–4.6)

## 2021-05-05 LAB — BODY FLUID CULTURE W GRAM STAIN: Culture: NO GROWTH

## 2021-05-05 LAB — GLUCOSE, CAPILLARY
Glucose-Capillary: 183 mg/dL — ABNORMAL HIGH (ref 70–99)
Glucose-Capillary: 186 mg/dL — ABNORMAL HIGH (ref 70–99)
Glucose-Capillary: 193 mg/dL — ABNORMAL HIGH (ref 70–99)
Glucose-Capillary: 210 mg/dL — ABNORMAL HIGH (ref 70–99)
Glucose-Capillary: 213 mg/dL — ABNORMAL HIGH (ref 70–99)
Glucose-Capillary: 226 mg/dL — ABNORMAL HIGH (ref 70–99)
Glucose-Capillary: 230 mg/dL — ABNORMAL HIGH (ref 70–99)

## 2021-05-05 MED ORDER — POTASSIUM PHOSPHATES 15 MMOLE/5ML IV SOLN
30.0000 mmol | Freq: Once | INTRAVENOUS | Status: AC
  Administered 2021-05-05: 30 mmol via INTRAVENOUS
  Filled 2021-05-05: qty 10

## 2021-05-05 MED ORDER — INSULIN ASPART 100 UNIT/ML IJ SOLN
0.0000 [IU] | INTRAMUSCULAR | Status: DC
  Administered 2021-05-05: 5 [IU] via SUBCUTANEOUS
  Administered 2021-05-05: 3 [IU] via SUBCUTANEOUS
  Administered 2021-05-06 (×2): 2 [IU] via SUBCUTANEOUS
  Administered 2021-05-06: 3 [IU] via SUBCUTANEOUS
  Administered 2021-05-06: 2 [IU] via SUBCUTANEOUS
  Administered 2021-05-07: 3 [IU] via SUBCUTANEOUS
  Administered 2021-05-07: 5 [IU] via SUBCUTANEOUS
  Administered 2021-05-07: 8 [IU] via SUBCUTANEOUS

## 2021-05-05 NOTE — TOC Initial Note (Signed)
Transition of Care [facility]) - Initial/Assessment Note    Patient Details  Name: [name] [name] MRN: [id number] Date of Birth: Jan 23, 1961  Transition of Care [facility]) CM/SW Contact:    Joanne Chars, LCSW Phone Number: 05/05/2021, 2:06 PM  Clinical Narrative:    CSW met with pt regarding DC recommendation for SNF.  Pt A and O x4 today, agreeable to SNF, choice document given, permission given to send out referral in hub, permission given to speak with sons [name] and [place].  Pt reports she is vaccinated for covid but not boosted.     CSW spoke with son [name] regarding plan for SNF and he is also in agreement with this.  Referral sent out in hub for SNF.              Expected Discharge Plan: Skilled Nursing Facility Barriers to Discharge: SNF Pending bed offer   Patient Goals and CMS Choice Patient states their goals for this hospitalization and ongoing recovery are:: be able to move on my own CMS Medicare.gov Compare Post Acute Care list provided to:: Patient Choice offered to / list presented to : Patient  Expected Discharge Plan and Services Expected Discharge Plan: [place] In-house Referral: Clinical Social Work   Post Acute Care Choice: [place] Living arrangements for the past 2 months: Apartment                                      Prior Living Arrangements/Services Living arrangements for the past 2 months: Apartment Lives with:: Self Patient language and need for interpreter reviewed:: Yes Do you feel safe going back to the place where you live?: Yes      Need for Family Participation in Patient Care: Yes (Comment) Care giver support system in place?: Yes (comment) Current home services: Other (comment) (none) Criminal Activity/Legal Involvement Pertinent to Current Situation/Hospitalization: No - Comment as needed  Activities of Daily Living Home Assistive Devices/Equipment: None ADL Screening (condition at time of  admission) Patient's cognitive ability adequate to safely complete daily activities?: No Is the patient deaf or have difficulty hearing?: Yes Does the patient have difficulty seeing, even when wearing glasses/contacts?: No Does the patient have difficulty concentrating, remembering, or making decisions?: Yes Patient able to express need for assistance with ADLs?: Yes Does the patient have difficulty dressing or bathing?: Yes Independently performs ADLs?: No Does the patient have difficulty walking or climbing stairs?: Yes Weakness of Legs: Both Weakness of Arms/Hands: Both  Permission Sought/Granted Permission sought to share information with : Family Supports Permission granted to share information with : Yes, Verbal Permission Granted  Share Information with NAME: sons [name] and [name]  Permission granted to share info w AGENCY: SNF        Emotional Assessment Appearance:: Appears stated age Attitude/Demeanor/Rapport: Engaged Affect (typically observed): Appropriate, Pleasant Orientation: : Oriented to Self, Oriented to Place, Oriented to  Time, Oriented to Situation Alcohol / Substance Use: Not Applicable Psych Involvement: No (comment)  Admission diagnosis:  Hypokalemia [E87.6] Hyponatremia [E87.1] Encephalopathy [G93.40] Sepsis ([place]) [A41.9] Urinary tract infection without hematuria, site unspecified [N39.0] AMS (altered mental status) [R41.82] Patient Active Problem List   Diagnosis Date Noted   AMS (altered mental status) 05/01/2021   Pressure injury of skin 05/01/2021   Malnutrition of moderate degree 05/01/2021   Hypokalemia    Urinary tract infection without hematuria    Lactic acidosis  Hypernatremia    Reactive depression (situational) 04/05/2021   Chest pain 04/06/2020   Coronary artery disease    Gastroesophageal reflux disease without esophagitis    Renal calculus, left 01/10/2019   Chest pain in adult 03/02/2017   Hypoxia    Acute on chronic  diastolic CHF (congestive heart failure) (HCC)    Hx of CABG    S/P angioplasty with stent; 10/04/15 to distal RCA lesion. with Promus premier. 10/08/2015   History of ST elevation myocardial infarction (STEMI) 10/04/2015   Superficial incisional surgical site infection, medial left lower extremity 07/10/2015   Hyperlipidemia with target LDL less than 70 06/13/2015   Chest pain with moderate risk of acute coronary syndrome    Tobacco abuse    Uncontrolled diabetes mellitus with hyperglycemia ([place]) 06/04/2012   Lupus ([place]) 06/03/2012   History of stroke 06/03/2012   Empty sella syndrome ([place]) 06/03/2012   Accelerated hypertension 06/03/2012   Rheumatoid arthritis ([place]) 06/03/2012   Hidradenitis axillaris 06/03/2012   Obesity (BMI 30.0-34.9)    PCP:  Leeroy Cha, MD Pharmacy:   [id number] [place] [id number]: [phone]: [phone]    Social Determinants of Health (SDOH) Interventions    Readmission Risk Interventions No flowsheet data found.

## 2021-05-05 NOTE — Evaluation (Signed)
Occupational Therapy Evaluation Patient Details Name: [name] [name] MRN: [id number] DOB: 08-28-1960 Today's Date: 05/05/2021   History of Present Illness 61 yo woman with PMH significant for DM on Metformin, RA with chronic pain, HTN, HL, CAD s/p CABG, UTI, CVA, recent 30 lb weight loss and functional decline  who was found by her son 2/14 incontinent of urine and altered. CT head negative, hypernatremia, fever; encephalopathy, septic shock due to empyema with 2/15 thoracentesis   Clinical Impression   PT admitted for concerns listed above. PTA Pt reported that she was independent with all ADL's and IADL's, using a rollator for functional mobility. At this time, pt presents with decreased activity tolerance, weakness, and balance deficits, as well as cognitive concerns, however cognition appears to be improving.  Pt able to roll in bed with min A for total A with LB bathing and bed clean up. Pt reports motivation to work on getting out of bed, however at this time was fatigued by bed mobility and clean up, so further transfers were deferred. Recommending SNF level therapies to maximize pt independence and reduce burden of care.      Recommendations for follow up therapy are one component of a multi-disciplinary discharge planning process, led by the attending physician.  Recommendations may be updated based on patient status, additional functional criteria and insurance authorization.   Follow Up Recommendations  Skilled nursing-short term rehab (<3 hours/day)    Assistance Recommended at Discharge Frequent or constant Supervision/Assistance  Patient can return home with the following Two people to help with walking and/or transfers;Two people to help with bathing/dressing/bathroom;Assistance with cooking/housework;Assistance with feeding;Direct supervision/assist for medications management;Direct supervision/assist for financial management;Assist for transportation;Help with stairs or ramp for  entrance    Functional Status Assessment  Patient has had a recent decline in their functional status and demonstrates the ability to make significant improvements in function in a reasonable and predictable amount of time.  Equipment Recommendations  Tub/shower seat;Wheelchair (measurements OT);Wheelchair cushion (measurements OT)    Recommendations for Other Services       Precautions / Restrictions Precautions Precautions: Fall;Other (comment) Precaution Comments: cortrak, rectal tube Restrictions Weight Bearing Restrictions: No      Mobility Bed Mobility Overal bed mobility: Needs Assistance Bed Mobility: Rolling Rolling: Min assist         General bed mobility comments: Pt able to roll towrads both sides with min A for total bed clean up and LB bath    Transfers                   General transfer comment: deferred due to fatigue from bed clean up      Balance Overall balance assessment: Needs assistance                                         ADL either performed or assessed with clinical judgement   ADL Overall ADL's : Needs assistance/impaired                                       General ADL Comments: Max assist at this time for all ADL's, pt attempts to help, but is very weak     Vision Baseline Vision/History: 1 Wears glasses Ability to See in Adequate Light: 1 Impaired Patient Visual Report: No  change from baseline Vision Assessment?: No apparent visual deficits     Perception     Praxis      Pertinent Vitals/Pain Pain Assessment Pain Assessment: No/denies pain     Hand Dominance Right   Extremity/Trunk Assessment Upper Extremity Assessment Upper Extremity Assessment: Generalized weakness;RUE deficits/detail;LUE deficits/detail RUE Deficits / Details: 3/5 MMT, poor coordination RUE Sensation: decreased proprioception RUE Coordination: decreased fine motor LUE Deficits / Details: 3/5 MMT, poor  coordination LUE Sensation: decreased proprioception LUE Coordination: decreased fine motor   Lower Extremity Assessment Lower Extremity Assessment: Defer to PT evaluation   Cervical / Trunk Assessment Cervical / Trunk Assessment: Kyphotic   Communication Communication Communication: No difficulties   Cognition Arousal/Alertness: Awake/alert Behavior During Therapy: Flat affect Overall Cognitive Status: Impaired/Different from baseline Area of Impairment: Orientation, Attention, Following commands, Awareness, Problem solving                 Orientation Level: Place, Time, Situation Current Attention Level: Sustained   Following Commands: Follows one step commands inconsistently, Follows one step commands with increased time   Awareness: Intellectual Problem Solving: Slow processing, Decreased initiation, Difficulty sequencing, Requires verbal cues, Requires tactile cues General Comments: required incr time for all commands; repetition of cues in addition to multi-modal cues     General Comments  VSS on RA, Cortrak left unplugged NT aware, Rectal tube coming out and leaking everywhere, RN and NT aware    Exercises     Shoulder Instructions      Home Living Family/patient expects to be discharged to:: Private residence Living Arrangements: Alone Available Help at Discharge: Family Type of Home: Apartment Home Access: Stairs to enter Technical brewer of Steps: 2-3   Home Layout: One level     Bathroom Shower/Tub: Occupational psychologist: Standard Bathroom Accessibility: Yes How Accessible: Accessible via walker Home Equipment: Rollator (4 wheels);BSC/3in1          Prior Functioning/Environment Prior Level of Function : Independent/Modified Independent             Mobility Comments: reports she began using a rollator in last 3-4 weeks due to falls (?accuracy due to AMS) ADLs Comments: reports indepndence        OT Problem List:  Decreased strength;Decreased range of motion;Decreased activity tolerance;Impaired balance (sitting and/or standing);Decreased coordination;Decreased cognition;Decreased safety awareness;Impaired sensation;Impaired UE functional use      OT Treatment/Interventions: Self-care/ADL training;Therapeutic exercise;Neuromuscular education;Energy conservation;Therapeutic activities;Cognitive remediation/compensation;Patient/family education;Balance training    OT Goals(Current goals can be found in the care plan section) Acute Rehab OT Goals Patient Stated Goal: To get stronger and get out of bed OT Goal Formulation: With patient Time For Goal Achievement: 05/19/21 Potential to Achieve Goals: Good ADL Goals Pt Will Perform Grooming: with set-up;sitting Pt Will Perform Upper Body Bathing: with min guard assist;sitting Pt Will Perform Lower Body Bathing: with min assist;sitting/lateral leans;sit to/from stand Pt Will Perform Upper Body Dressing: with min guard assist;sitting Pt Will Perform Lower Body Dressing: with mod assist;sitting/lateral leans;sit to/from stand Pt Will Transfer to Toilet: with mod assist;with +2 assist;stand pivot transfer Pt Will Perform Toileting - Clothing Manipulation and hygiene: with min guard assist;sitting/lateral leans;sit to/from stand  OT Frequency: Min 2X/week    Co-evaluation              AM-PAC OT "6 Clicks" Daily Activity     Outcome Measure Help from another person eating meals?: A Lot Help from another person taking care of personal grooming?: A Lot  Help from another person toileting, which includes using toliet, bedpan, or urinal?: Total Help from another person bathing (including washing, rinsing, drying)?: A Lot Help from another person to put on and taking off regular upper body clothing?: A Lot Help from another person to put on and taking off regular lower body clothing?: A Lot 6 Click Score: 11   End of Session Nurse Communication: Mobility  status  Activity Tolerance: Patient limited by fatigue Patient left: in bed;with call bell/phone within reach;with bed alarm set  OT Visit Diagnosis: Unsteadiness on feet (R26.81);Other abnormalities of gait and mobility (R26.89);Muscle weakness (generalized) (M62.81)                Time: 1062-6948 OT Time Calculation (min): 35 min Charges:  OT General Charges $OT Visit: 1 Visit OT Evaluation $OT Eval Moderate Complexity: 1 Mod OT Treatments $Self Care/Home Management : 8-22 mins  Taige Housman H., OTR/L Acute Rehabilitation  Azharia Surratt Elane Yolanda Bonine 05/05/2021, 5:56 PM

## 2021-05-05 NOTE — NC FL2 (Signed)
[place] LEVEL OF CARE SCREENING TOOL     IDENTIFICATION  Patient Name: [name] [name] Birthdate: 1961-02-08 Sex: female Admission Date (Current Location): 04/30/2021  [facility] and [place] Number:  Herbalist and Address:  [facility] [place], [place] [address], [place], [place] [address]      Provider Number: [id number]  Attending Physician Name and Address:  Little Ishikawa, MD  Relative Name and Phone Number:  [name], [name]   [phone]    Current Level of Care: Hospital Recommended Level of Care: [place] Prior Approval Number:    Date Approved/Denied:   PASRR Number: [id number]  Discharge Plan: SNF    Current Diagnoses: Patient Active Problem List   Diagnosis Date Noted   AMS (altered mental status) 05/01/2021   Pressure injury of skin 05/01/2021   Malnutrition of moderate degree 05/01/2021   Hypokalemia    Urinary tract infection without hematuria    Lactic acidosis    Hypernatremia    Reactive depression (situational) 04/05/2021   Chest pain 04/06/2020   Coronary artery disease    Gastroesophageal reflux disease without esophagitis    Renal calculus, left 01/10/2019   Chest pain in adult 03/02/2017   Hypoxia    Acute on chronic diastolic CHF (congestive heart failure) (HCC)    Hx of CABG    S/P angioplasty with stent; 10/04/15 to distal RCA lesion. with Promus premier. 10/08/2015   History of ST elevation myocardial infarction (STEMI) 10/04/2015   Superficial incisional surgical site infection, medial left lower extremity 07/10/2015   Hyperlipidemia with target LDL less than 70 06/13/2015   Chest pain with moderate risk of acute coronary syndrome    Tobacco abuse    Uncontrolled diabetes mellitus with hyperglycemia ([place]) 06/04/2012   Lupus ([place]) 06/03/2012   History of stroke 06/03/2012   Empty sella syndrome ([place]) 06/03/2012   Accelerated hypertension 06/03/2012   Rheumatoid arthritis  ([place]) 06/03/2012   Hidradenitis axillaris 06/03/2012   Obesity (BMI 30.0-34.9)     Orientation RESPIRATION BLADDER Height & Weight     Self, Time, Situation, Place  Normal Incontinent, Indwelling catheter Weight: 174 lb 2.6 oz (79 kg) Height:  5\' 6"  (167.6 cm)  BEHAVIORAL SYMPTOMS/MOOD NEUROLOGICAL BOWEL NUTRITION STATUS      Incontinent Diet (see discharge summary)  AMBULATORY STATUS COMMUNICATION OF NEEDS Skin   Total Care Verbally Bruising (arms)                       Personal Care Assistance Level of Assistance  Bathing, Feeding, Dressing Bathing Assistance: Maximum assistance Feeding assistance: Limited assistance Dressing Assistance: Maximum assistance     Functional Limitations Info  Sight, Hearing, Speech Sight Info: Adequate Hearing Info: Adequate Speech Info: Adequate    SPECIAL CARE FACTORS FREQUENCY  PT (By licensed PT), OT (By licensed OT)     PT Frequency: 5x week OT Frequency: 5x week            Contractures Contractures Info: Not present    Additional Factors Info  Code Status, Allergies, Insulin Sliding Scale Code Status Info: full Allergies Info: Apremilast, Latex   Insulin Sliding Scale Info: Novolog, 0-9 units Q4 hours, see discharge summary.       Current Medications (05/05/2021):  This is the current hospital active medication list Current Facility-Administered Medications  Medication Dose Route Frequency Provider Last Rate Last Admin   amLODipine (NORVASC) tablet 5 mg  5 mg Per Tube  Daily Jonetta Osgood, MD   5 mg at 05/05/21 9179   aspirin chewable tablet 81 mg  81 mg Per Tube Daily Willette Cluster, RPH   81 mg at 05/05/21 1505   atorvastatin (LIPITOR) tablet 40 mg  40 mg Per Tube Daily Julian Hy, DO   40 mg at 05/05/21 6979   B-complex with vitamin C tablet 1 tablet  1 tablet Per Tube Daily Julian Hy, DO   1 tablet at 05/05/21 0941   bethanechol (URECHOLINE) tablet 10 mg  10 mg Per Tube TID Noemi Chapel P, DO    10 mg at 05/05/21 0941   chlorhexidine (PERIDEX) 0.12 % solution 15 mL  15 mL Mouth Rinse BID Noemi Chapel P, DO   15 mL at 05/05/21 4801   Chlorhexidine Gluconate Cloth 2 % PADS 6 each  6 each Topical Daily Collier Bullock, MD   6 each at 05/05/21 0951   docusate (COLACE) 50 MG/5ML liquid 100 mg  100 mg Per Tube BID PRN Willette Cluster, RPH       feeding supplement (JEVITY 1.2 CAL) liquid 1,000 mL  1,000 mL Per Tube Continuous Julian Hy, DO 65 mL/hr at 05/04/21 0600 1,000 mL at 05/04/21 0600   feeding supplement (PROSource TF) liquid 45 mL  45 mL Per Tube BID Noemi Chapel P, DO   45 mL at 05/05/21 0941   fentaNYL (SUBLIMAZE) injection 12.5 mcg  12.5 mcg Intravenous Once PRN Noemi Chapel P, DO       free water 200 mL  200 mL Per Tube Q4H Noemi Chapel P, DO   200 mL at 05/05/21 1253   insulin aspart (novoLOG) injection 0-9 Units  0-9 Units Subcutaneous Q4H Gleason, Otilio Carpen, [name]   3 Units at 05/05/21 1252   MEDLINE mouth rinse  15 mL Mouth Rinse q12n4p Noemi Chapel P, DO   15 mL at 05/05/21 1253   pantoprazole sodium (PROTONIX) 40 mg/20 mL oral suspension 40 mg  40 mg Per Tube Daily Madalyn Rob, MD   40 mg at 05/05/21 0942   piperacillin-tazobactam (ZOSYN) IVPB 3.375 g  3.375 g Intravenous Q8H Noemi Chapel P, DO 12.5 mL/hr at 05/05/21 1328 3.375 g at 05/05/21 1328   pneumococcal 23 valent vaccine (PNEUMOVAX-23) injection 0.5 mL  0.5 mL Intramuscular Tomorrow-1000 Ghimire, Henreitta Leber, MD       polyethylene glycol (MIRALAX / GLYCOLAX) packet 17 g  17 g Oral Daily PRN Gleason, Otilio Carpen, [name]       polyvinyl alcohol (LIQUIFILM TEARS) 1.4 % ophthalmic solution 1 drop  1 drop Both Eyes PRN Madalyn Rob, MD   1 drop at 05/01/21 1319     Discharge Medications: Please see discharge summary for a list of discharge medications.  Relevant Imaging Results:  Relevant Lab Results:   Additional Information SSN: [id number].  Pt is vaccinated for covid but not boosted.  Joanne Chars,  LCSW

## 2021-05-05 NOTE — Progress Notes (Signed)
PROGRESS NOTE        PATIENT DETAILS Name: [name] [name] Age: 61 y.o. Sex: female Date of Birth: 09/21/1960 Admit Date: 04/30/2021 Admitting Physician [name] Bullock, MD [id number], [name] Spies, MD  Brief Summary: Patient is a 61 y.o.  female with history of psoriatic arthritis, DM-2, HTN, CAD s/p CABG-presented to the hospital on 2/14 with confusion, hypotension, fever-she was found to have septic shock due to empyema-monitor closely in the [place] thoracocentesis-improved-and subsequently transferred to the [facility] service.  See below for further details.  Significant Hospital events: 2/14>> admit to ICU-confused/hypotensive/febrile-severe sepsis/septic shock-from empyema.  Numerous electrolyte abnormalities. 2/15>> thoracocentesis 2/17>> transfer to [facility]  Significant imaging studies: 2/14>> CT head: No acute abnormality 2/14>> CXR: Small volume right pleural effusion 2/15>> renal ultrasound: No hydronephrosis 2/15>> Echo: EF 19%, grade 1 diastolic dysfunction 1/47>> CXR: Mild right basilar atelectasis/infiltrate with small right pleural effusion.  Significant microbiology data: 2/14>> COVID/influenza PCR: Negative 2/14>> blood culture: Negative 2/14>> urine culture: Negative 2/15>> pleural fluid culture: No growth 2/15>> pleural fluid AFB smear: Negative 2/15>> pleural fluid AFB culture: Pending  Procedures: 2/15>> thoracocentesis  Consults: PCCM  Subjective: No acute issues or events overnight, able to answer simple questions appropriately.  Has NG tube in place.  Objective: Vitals: Blood pressure (!) 149/83, pulse 92, temperature 98 F (36.7 C), temperature source Axillary, resp. rate 17, height 5\' 6"  (1.676 m), weight 79 kg, SpO2 100 %.   Exam: Gen Exam:Alert awake-not in any distress.  Frail appearing. HEENT:atraumatic, normocephalic Chest: B/L clear to auscultation anteriorly CVS:S1S2 regular Abdomen:soft non tender, non  distended Extremities:no edema Neurology: Non focal-generalized weakness. Skin: no rash  Pertinent Labs/Radiology: CBC Latest Ref Rng & Units 05/05/2021 05/04/2021 05/03/2021  WBC 4.0 - 10.5 K/uL 7.1 4.5 5.0  Hemoglobin 12.0 - 15.0 g/dL 11.4(L) 11.4(L) 12.7  Hematocrit 36.0 - 46.0 % 36.6 35.7(L) 40.5  Platelets 150 - 400 K/uL 80(L) 75(L) 77(L)    Lab Results  Component Value Date   NA 138 05/05/2021   K 3.3 (L) 05/05/2021   CL 104 05/05/2021   CO2 26 05/05/2021    Assessment/Plan:  Septic shock due to empyema-s/p thoracocentesis on 2/15, resolving:  -Sepsis physiology has significantly improved-cultures negative so far-on IV Zosyn.  No reaccumulation on CXR done on 2/17-no further recommendations from PCCM.  Acute metabolic encephalopathy, resolving: Due to sepsis/AKI/hypernatremia-remarkably better-suspect will continue to improve.  CT head was negative for acute abnormalities.  AKI: Likely hemodynamically mediated-resolved with supportive care.  Malnutrition/Moderate Electrolyte disturbances Exacerbated by poor PO intake/dysphagia as below Continue NG - speech following along -dysphagia 2 diet ongoing but NG feeds to supplement nutrition/free water. Hypernatremia: Resolved-stop hypertonic saline.  Continue tube feeds and follow Hypokalemia: Replete and recheck. Hypophosphatemia: Replete and recheck.  Watch closely for signs of refeeding syndrome.   HTN: Continue amlodipine NIDDM-2: CBG stable with SSI-resume oral hypoglycemic agents on discharge.  CAD s/p CABG in 2017 and PCI/DES to RCA 2017: Troponin minimally elevated in the setting of demand ischemia/sepsis  Combined chronic systolic/diastolic heart failure: Volume status stable-no role for diuretics currently.  Acute urinary retention:  Secondary y to acute illness debility/deconditioning Continue Foley catheter-voiding trial in the next few days.  Remains on Urecholine.  Thrombocytopenia:  Likely in the setting  of acute illness Heparin previously discontinued We will follow HIT antibodies (pharmacy evaluation order per protocol)  GERD:  Continue PPI  History of psoriatic arthritis: Follows with rheumatology in the outpatient setting.  Debility/deconditioning/functional quadriplegia: Due to acute illness-await PT/OT eval-suspect will require SNF.  Nutrition Status: Nutrition Problem: Moderate Malnutrition Etiology: chronic illness, dysphagia Signs/Symptoms: moderate muscle depletion, moderate fat depletion, percent weight loss (15% weight loss within 6 months) Percent weight loss: 15 % Interventions: Tube feeding, MVI  BMI: Estimated body mass index is 28.11 kg/m as calculated from the following:   Height as of this encounter: 5\' 6"  (1.676 m).   Weight as of this encounter: 79 kg.   Code status:   Code Status: Full Code   DVT Prophylaxis: SCDs Start: 05/01/21 0019   Family Communication: Son [name] [name]-updated over the phone on 2/17   Disposition Plan: Status is: Inpatient Remains inpatient appropriate because: Resolving sepsis physiology/resolving encephalopathy-on IV antibiotics-needs several more days of hospitalization to ensure stability and appropriate discharge disposition.  Probably SNF on discharge.    Planned Discharge Destination:Skilled nursing facility   Diet: Diet Order             DIET DYS 2 Room service appropriate? Yes; Fluid consistency: Thin  Diet effective now                     Antimicrobial agents: Anti-infectives (From admission, onward)    Start     Dose/Rate Route Frequency Ordered Stop   05/02/21 1800  piperacillin-tazobactam (ZOSYN) IVPB 3.375 g        3.375 g 12.5 mL/hr over 240 Minutes Intravenous Every 8 hours 05/02/21 1220 05/08/21 2159   05/01/21 2200  ceFEPIme (MAXIPIME) 2 g in sodium chloride 0.9 % 100 mL IVPB  Status:  Discontinued        2 g 200 mL/hr over 30 Minutes Intravenous Every 12 hours 05/01/21 0735 05/01/21  1722   05/01/21 2100  vancomycin (VANCOREADY) IVPB 1500 mg/300 mL  Status:  Discontinued        1,500 mg 150 mL/hr over 120 Minutes Intravenous Every 24 hours 04/30/21 1956 05/01/21 1009   05/01/21 1800  piperacillin-tazobactam (ZOSYN) IVPB 3.375 g  Status:  Discontinued        3.375 g 12.5 mL/hr over 240 Minutes Intravenous Every 8 hours 05/01/21 1722 05/02/21 1220   04/30/21 2000  ceFEPIme (MAXIPIME) 2 g in sodium chloride 0.9 % 100 mL IVPB  Status:  Discontinued        2 g 200 mL/hr over 30 Minutes Intravenous Every 8 hours 04/30/21 1923 05/01/21 0735   04/30/21 1915  vancomycin (VANCOREADY) IVPB 1750 mg/350 mL        1,750 mg 175 mL/hr over 120 Minutes Intravenous  Once 04/30/21 1905 04/30/21 2228   04/30/21 1900  metroNIDAZOLE (FLAGYL) IVPB 500 mg        500 mg 100 mL/hr over 60 Minutes Intravenous  Once 04/30/21 1845 04/30/21 2057        MEDICATIONS: Scheduled Meds:  amLODipine  5 mg Per Tube Daily   aspirin  81 mg Per Tube Daily   atorvastatin  40 mg Per Tube Daily   B-complex with vitamin C  1 tablet Per Tube Daily   bethanechol  10 mg Per Tube TID   chlorhexidine  15 mL Mouth Rinse BID   Chlorhexidine Gluconate Cloth  6 each Topical Daily   feeding supplement (PROSource TF)  45 mL Per Tube BID   free water  200 mL Per Tube Q4H   insulin aspart  0-9 Units Subcutaneous Q4H  mouth rinse  15 mL Mouth Rinse q12n4p   pantoprazole sodium  40 mg Per Tube Daily   pneumococcal 23 valent vaccine  0.5 mL Intramuscular Tomorrow-1000   Continuous Infusions:  feeding supplement (JEVITY 1.2 CAL) 1,000 mL (05/04/21 0600)   piperacillin-tazobactam (ZOSYN)  IV 3.375 g (05/05/21 0714)   potassium PHOSPHATE IVPB (in mmol) 30 mmol (05/05/21 0451)   PRN Meds:.docusate, fentaNYL (SUBLIMAZE) injection, polyethylene glycol, polyvinyl alcohol   I have personally reviewed following labs and imaging studies  LABORATORY DATA: CBC: Recent Labs  Lab 04/30/21 1834 04/30/21 2130  05/01/21 0619 05/02/21 0248 05/03/21 0245 05/04/21 0323 05/05/21 0142  WBC 9.7   < > 10.9* 6.9 5.0 4.5 7.1  NEUTROABS 8.4*  --   --  5.8 3.3  --   --   HGB 16.4*   < > 14.1 12.2 12.7 11.4* 11.4*  HCT 53.1*   < > 46.3* 39.3 40.5 35.7* 36.6  MCV 95.5   < > 94.7 94.2 91.6 90.2 91.7  PLT 201   < > 128* 91* 77* 75* 80*   < > = values in this interval not displayed.     Basic Metabolic Panel: Recent Labs  Lab 05/01/21 0619 05/01/21 1211 05/02/21 0248 05/02/21 1916 05/03/21 0245 05/04/21 0323 05/05/21 0142  NA 152* 150* 148*  --  142 140 138  K 3.5 3.7 3.3*  --  3.0* 3.4* 3.3*  CL 114* 113* 113*  --  107 106 104  CO2 20* 20* 27  --  28 25 26   GLUCOSE 96 97 173*  --  204* 268* 202*  BUN 11 12 12   --  13 8 5*  CREATININE 1.24* 1.02* 0.86  --  0.60 0.56 0.45  CALCIUM 8.9 8.6* 8.4*  --  7.7* 7.5* 7.8*  MG 1.6*  --  2.5*  --  1.8 2.0 1.7  PHOS 2.8  --  1.4* 3.1 1.5* 1.1* <1.0*     GFR: Estimated Creatinine Clearance: 77.4 mL/min (by C-G formula based on SCr of 0.45 mg/dL).  Liver Function Tests: Recent Labs  Lab 04/30/21 1834 05/01/21 1633 05/04/21 0323  AST 35  --  102*  ALT 15  --  57*  ALKPHOS 49  --  75  BILITOT 1.2  --  0.9  PROT 8.2* 6.7 5.0*  ALBUMIN 3.1*  --  1.9*    No results for input(s): LIPASE, AMYLASE in the last 168 hours. Recent Labs  Lab 05/01/21 0619  AMMONIA 38*     Coagulation Profile: Recent Labs  Lab 04/30/21 1834  INR 1.2     Cardiac Enzymes: No results for input(s): CKTOTAL, CKMB, CKMBINDEX, TROPONINI in the last 168 hours.  BNP (last 3 results) No results for input(s): PROBNP in the last 8760 hours.  Lipid Profile: No results for input(s): CHOL, HDL, LDLCALC, TRIG, CHOLHDL, LDLDIRECT in the last 72 hours.  Thyroid Function Tests: No results for input(s): TSH, T4TOTAL, FREET4, T3FREE, THYROIDAB in the last 72 hours.   Anemia Panel: Recent Labs    05/02/21 1335  VITAMINB12 1,831*  FOLATE 6.2     Urine analysis:     Component Value Date/Time   COLORURINE AMBER (A) 04/30/2021 1926   APPEARANCEUR HAZY (A) 04/30/2021 1926   LABSPEC 1.021 04/30/2021 1926   PHURINE 6.0 04/30/2021 1926   GLUCOSEU NEGATIVE 04/30/2021 1926   HGBUR SMALL (A) 04/30/2021 1926   BILIRUBINUR NEGATIVE 04/30/2021 1926   KETONESUR 20 (A) 04/30/2021 [place]  100 (A) 04/30/2021 1926   UROBILINOGEN 1.0 06/17/2012 1909   NITRITE NEGATIVE 04/30/2021 1926   LEUKOCYTESUR TRACE (A) 04/30/2021 1926    Sepsis Labs: Lactic Acid, Venous    Component Value Date/Time   LATICACIDVEN 5.9 (HH) 05/01/2021 1211    MICROBIOLOGY: Recent Results (from the past 240 hour(s))  Resp Panel by RT-PCR (Flu A&B, Covid) Urine, In & Out Cath     Status: None   Collection Time: 04/30/21  6:36 PM   Specimen: Urine, In & Out Cath; Nasopharyngeal(NP) swabs in vial transport medium  Result Value Ref Range Status   SARS Coronavirus 2 by RT PCR NEGATIVE NEGATIVE Final    Comment: (NOTE) SARS-CoV-2 target nucleic acids are NOT DETECTED.  The SARS-CoV-2 RNA is generally detectable in upper respiratory specimens during the acute phase of infection. The lowest concentration of SARS-CoV-2 viral copies this assay can detect is 138 copies/mL. A negative result does not preclude SARS-Cov-2 infection and should not be used as the sole basis for treatment or other patient management decisions. A negative result may occur with  improper specimen collection/handling, submission of specimen other than nasopharyngeal swab, presence of viral mutation(s) within the areas targeted by this assay, and inadequate number of viral copies(<138 copies/mL). A negative result must be combined with clinical observations, patient history, and epidemiological information. The expected result is Negative.  Fact Sheet for Patients:  EntrepreneurPulse.com.au  Fact Sheet for Healthcare Providers:  IncredibleEmployment.be  This test is  no t yet approved or cleared by the [place] FDA and  has been authorized for detection and/or diagnosis of SARS-CoV-2 by FDA under an Emergency Use Authorization (EUA). This EUA will remain  in effect (meaning this test can be used) for the duration of the COVID-19 declaration under Section 564(b)(1) of the Act, 21 U.S.C.section 360bbb-3(b)(1), unless the authorization is terminated  or revoked sooner.       Influenza A by PCR NEGATIVE NEGATIVE Final   Influenza B by PCR NEGATIVE NEGATIVE Final    Comment: (NOTE) The Xpert Xpress SARS-CoV-2/FLU/RSV plus assay is intended as an aid in the diagnosis of influenza from Nasopharyngeal swab specimens and should not be used as a sole basis for treatment. Nasal washings and aspirates are unacceptable for Xpert Xpress SARS-CoV-2/FLU/RSV testing.  Fact Sheet for Patients: EntrepreneurPulse.com.au  Fact Sheet for Healthcare Providers: IncredibleEmployment.be  This test is not yet approved or cleared by the [place] FDA and has been authorized for detection and/or diagnosis of SARS-CoV-2 by FDA under an Emergency Use Authorization (EUA). This EUA will remain in effect (meaning this test can be used) for the duration of the COVID-19 declaration under Section 564(b)(1) of the Act, 21 U.S.C. section 360bbb-3(b)(1), unless the authorization is terminated or revoked.  Performed at [facility], [place] [address] [place], [place] [address]   Urine Culture     Status: None   Collection Time: 04/30/21  6:44 PM   Specimen: In/Out Cath Urine  Result Value Ref Range Status   Specimen Description IN/OUT CATH URINE  Final   Special Requests NONE  Final   Culture   Final    NO GROWTH Performed at [facility], [place] [address], [place], [place] [address]    Report Status 05/02/2021 FINAL  Final  Culture, blood (Routine x 2)     Status: None (Preliminary result)   Collection Time: 04/30/21   6:55 PM   Specimen: BLOOD  Result Value Ref Range Status  Specimen Description BLOOD LEFT ANTECUBITAL  Final   Special Requests   Final    BOTTLES DRAWN AEROBIC AND ANAEROBIC Blood Culture adequate volume   Culture   Final    NO GROWTH 4 DAYS Performed at [facility], [address], [place], [place] [address]    Report Status PENDING  Incomplete  MRSA Next Gen by PCR, Nasal     Status: None   Collection Time: 05/01/21  4:14 AM   Specimen: Nasal Mucosa; Nasal Swab  Result Value Ref Range Status   MRSA by PCR Next Gen NOT DETECTED NOT DETECTED Final    Comment: (NOTE) The GeneXpert MRSA Assay (FDA approved for NASAL specimens only), is one component of a comprehensive MRSA colonization surveillance program. It is not intended to diagnose MRSA infection nor to guide or monitor treatment for MRSA infections. Test performance is not FDA approved in patients less than 67 years old. Performed at [facility], [place] [address], [place], [place] [address]   Acid Fast Smear (AFB)     Status: None   Collection Time: 05/01/21  4:13 PM   Specimen: Pleural  Result Value Ref Range Status   AFB Specimen Processing Concentration  Final   Acid Fast Smear Negative  Final    Comment: (NOTE) Performed At: [facility] [place], [place] [id number] Rush Farmer MD OV:[id number]    Source (AFB) FLUID  Final    Comment: Performed at [facility], [place] [address] [place], [place] [address]  Body fluid culture w Gram Stain     Status: None (Preliminary result)   Collection Time: 05/01/21  4:21 PM   Specimen: Pleural Fluid  Result Value Ref Range Status   Specimen Description PLEURAL  Final   Special Requests NONE  Final   Gram Stain   Final    RARE WBC PRESENT, PREDOMINANTLY MONONUCLEAR NO ORGANISMS SEEN    Culture   Final    NO GROWTH 3 DAYS Performed at [facility], [place] [address], [place], [place] [address]    Report Status PENDING   Incomplete    RADIOLOGY STUDIES/RESULTS: No results found.   LOS: 4 days   Little Ishikawa, DO  [facility]    To contact the attending provider between 7A-7P or the covering provider during after hours 7P-7A, please log into the web site [email or web address] and access using universal [place] password for that web site. If you do not have the password, please call the hospital operator.  05/05/2021, 7:20 AM

## 2021-05-06 ENCOUNTER — Inpatient Hospital Stay (HOSPITAL_COMMUNITY): Payer: Medicare HMO

## 2021-05-06 ENCOUNTER — Other Ambulatory Visit: Payer: Self-pay

## 2021-05-06 LAB — GLUCOSE, CAPILLARY
Glucose-Capillary: 103 mg/dL — ABNORMAL HIGH (ref 70–99)
Glucose-Capillary: 129 mg/dL — ABNORMAL HIGH (ref 70–99)
Glucose-Capillary: 129 mg/dL — ABNORMAL HIGH (ref 70–99)
Glucose-Capillary: 130 mg/dL — ABNORMAL HIGH (ref 70–99)
Glucose-Capillary: 162 mg/dL — ABNORMAL HIGH (ref 70–99)
Glucose-Capillary: 65 mg/dL — ABNORMAL LOW (ref 70–99)
Glucose-Capillary: 77 mg/dL (ref 70–99)

## 2021-05-06 MED ORDER — LIP MEDEX EX OINT
TOPICAL_OINTMENT | CUTANEOUS | Status: DC | PRN
  Filled 2021-05-06: qty 7

## 2021-05-06 MED ORDER — ENSURE ENLIVE PO LIQD
237.0000 mL | Freq: Two times a day (BID) | ORAL | Status: DC
  Administered 2021-05-07 – 2021-05-09 (×6): 237 mL via ORAL

## 2021-05-06 NOTE — Progress Notes (Signed)
Inpatient Diabetes Program Recommendations  AACE/ADA: New Consensus Statement on Inpatient Glycemic Control (2015)  Target Ranges:  Prepandial:   less than 140 mg/dL      Peak postprandial:   less than 180 mg/dL (1-2 hours)      Critically ill patients:  140 - 180 mg/dL   Lab Results  Component Value Date   GLUCAP 77 05/06/2021   HGBA1C 6.7 (H) 04/30/2021    Review of Glycemic Control  Latest Reference Range & Units 05/05/21 07:29 05/05/21 12:19 05/05/21 17:33 05/05/21 20:40 05/06/21 00:31 05/06/21 03:54 05/06/21 07:38 05/06/21 08:48  Glucose-Capillary 70 - 99 mg/dL 226 (H) 230 (H) 213 (H) 186 (H) 162 (H) 129 (H) 65 (L) 77   Diabetes history: DM 2 Outpatient Diabetes medications: Victoza 1.8 mg Daily, Metformin 1000 mg bid Current orders for Inpatient glycemic control:  Novolog 0-15 units Q4 hours  Inpatient Diabetes Program Recommendations:    Note: hypoglycemia this am due to Q4 hour coverage. Pt now on diet.  -  Reduce frequency of Novolog to tid + hs scale.  Thanks,  Tama Headings RN, MSN, BC-ADM Inpatient Diabetes Coordinator Team Pager [phone] (8a-5p)

## 2021-05-06 NOTE — Progress Notes (Signed)
Hypoglycemic Event  CBG: 65  Treatment: 4 oz juice/soda  Symptoms: Nervous/irritable  Follow-up CBG: [id number] CBG Result:77  Possible Reasons for Event: Inadequate meal intake  Comments/MD notified:MD Pamella Pert

## 2021-05-06 NOTE — Progress Notes (Signed)
Speech Language Pathology Treatment: Dysphagia  Patient Details Name: [name] [name] MRN: [id number] DOB: 1960/05/07 Today's Date: 05/06/2021 Time: 1607-3710 SLP Time Calculation (min) (ACUTE ONLY): 16 min  Assessment / Plan / Recommendation Clinical Impression  Pt is more responsive, alert and interactive than last session. Responses not delayed and pt asking about her phone and glasses. She states she ate 2-3 spoonfuls of eggs this morning. SLP encouraged her to eat more as Dr. Avon Gully is removing her NGT today- this may promote hunger. No indications of aspiration with straw sips thin and lemonade icee. Therapist upgraded her solids to Dys 3, continue thin. Encouraged her to eat as much as possible to meet her nutrition needs. ST will follow up once more to ensure adequate oropharyngeal swallow with upgraded texture.    HPI HPI: Patient is a 61 y.o. female with PMH: DM, RA with chronic pain, HTN, HL, CAD s/p CABG. She was found on 2/14 by her son incontinent of urine and with AMS. EMS was called and patient initially intermittently responsive to pain and voice, initially hypotensive. Son reported to medical staff that patient was exhibiting a gradual worsening over last couple months with weight loss (30 lb), aching pain all over and swallowing issues (sensation of food getting caught in throat). She fell last week and was seen in ED on 2/9 and discharged home. Son also reported unsure if patient taking her medications correctly. In ED, BP improved, CT head negative, initially febrile at 100.9. CXR revealed  small volume right pleural effusion. Paitent was made NPO and speech swallow evaluation ordered.      SLP Plan  Continue with current plan of care      Recommendations for follow up therapy are one component of a multi-disciplinary discharge planning process, led by the attending physician.  Recommendations may be updated based on patient status, additional functional criteria and  insurance authorization.    Recommendations  Diet recommendations: Dysphagia 3 (mechanical soft);Thin liquid Liquids provided via: Cup;Straw Medication Administration: Whole meds with liquid Supervision: Staff to assist with self feeding;Patient able to self feed Compensations: Slow rate;Small sips/bites Postural Changes and/or Swallow Maneuvers: Seated upright 90 degrees                Oral Care Recommendations: Oral care BID Follow Up Recommendations: Skilled nursing-short term rehab (<3 hours/day) Assistance recommended at discharge: Frequent or constant Supervision/Assistance SLP Visit Diagnosis: Dysphagia, unspecified (R13.10) Plan: Continue with current plan of care           Houston Siren  05/06/2021, 9:52 AM

## 2021-05-06 NOTE — Progress Notes (Signed)
Pt pulled out NG tube. New NG tube inserted. Placement verified with ausculation. Awaiting CXR results.

## 2021-05-06 NOTE — Progress Notes (Signed)
14Fr indwelling foley noted to be leaking. Foley is in place. MD notified. New order to replace foley

## 2021-05-06 NOTE — Progress Notes (Signed)
Nutrition Follow-up  DOCUMENTATION CODES:   Non-severe (moderate) malnutrition in context of chronic illness  INTERVENTION:   Discontinue TF regimen Continue B-complex with vitamin C to supplement for suspected deficiency.  Ensure Enlive po BID, each supplement provides 350 kcal and 20 grams of protein. Make pt with assist on meal ordering  NUTRITION DIAGNOSIS:   Moderate Malnutrition related to chronic illness, dysphagia as evidenced by moderate muscle depletion, moderate fat depletion, percent weight loss (15% weight loss within 6 months). - Ongoing  GOAL:   Patient will meet greater than or equal to 90% of their needs - Ongoing  MONITOR:   PO intake, Supplement acceptance, Labs, Skin  REASON FOR ASSESSMENT:   Consult Enteral/tube feeding initiation and management  ASSESSMENT:   61 yo female admitted with acute encephalopathy after being found incontinent of urine and altered by her son. PMH includes DM, RA, HTN, HLD, CAD, L renal stone.  2/15 - thoracentesis; Cortrak placed 2/16 - TF initiated 2/17 - transferred to floor; diet advanced to Dys 2, thin liquids 2/20 - Cortrak removed; diet advanced to Dys 3, thin liquids  Pt reports that her appetite is good and that she has good and bad days with eating. Pt was not aware that Cortrak tube was removed. Discussed with pt that we will follow how she eats, but if her intake is poor we may need to reinsert the tube. Discussed adding ONS, pt agreeable.  No meal intakes recorded within EMR.   Medications reviewed and include: B-complex w/ Vitamin C, SSI 0-15 units q4h, Protonix, IV antibiotics  Labs reviewed: Potassium 3.3, Phosphorus 2.0   Diet Order:   Diet Order             DIET DYS 3 Room service appropriate? Yes; Fluid consistency: Thin  Diet effective now                   EDUCATION NEEDS:   Not appropriate for education at this time  Skin:  Skin Assessment: Skin Integrity Issues: Skin Integrity  Issues:: Stage I Stage I: sacrum  Last BM:  2/19  Height:   Ht Readings from Last 1 Encounters:  04/30/21 5\' 6"  (1.676 m)    Weight:   Wt Readings from Last 1 Encounters:  05/05/21 79 kg    BMI:  Body mass index is 28.11 kg/m.  Estimated Nutritional Needs:   Kcal:  1900-2100  Protein:  100-115 gm  Fluid:  >/= 1.9 L    [name] [name], RD, LDN Clinical Dietitian See [facility] for contact information.

## 2021-05-06 NOTE — Progress Notes (Signed)
PROGRESS NOTE        PATIENT DETAILS Name: [name] [name] Age: 61 y.o. Sex: female Date of Birth: [birth year] Admit Date: 04/30/2021 Admitting Physician Collier Bullock, MD BHA:[id number], Ronie Spies, MD  Brief Summary: Patient is a 61 y.o.  female with history of psoriatic arthritis, DM-2, HTN, CAD s/p CABG-presented to the hospital on 2/14 with confusion, hypotension, fever-she was found to have septic shock due to empyema-monitor closely in the [place] thoracocentesis-improved-and subsequently transferred to the [facility] service.  See below for further details.  Significant Hospital events: 2/14>> admit to ICU-confused/hypotensive/febrile-severe sepsis/septic shock-from empyema.  Numerous electrolyte abnormalities. 2/15>> thoracocentesis 2/17>> transfer to [facility] 2/18-20>>Continue to work with PT/OT/SLP - tentative plan for SNF once able to remove NG  Significant imaging studies: 2/14>> CT head: No acute abnormality 2/14>> CXR: Small volume right pleural effusion 2/15>> renal ultrasound: No hydronephrosis 2/15>> Echo: EF 35%, grade 1 diastolic dysfunction 3/29>> CXR: Mild right basilar atelectasis/infiltrate with small right pleural effusion.  Significant microbiology data: 2/14>> COVID/influenza PCR: Negative 2/14>> blood culture: Negative 2/14>> urine culture: Negative 2/15>> pleural fluid culture: No growth 2/15>> pleural fluid AFB smear: Negative 2/15>> pleural fluid AFB culture: Pending  Procedures: 2/15>> thoracocentesis  Consults: PCCM  Subjective: No acute issues or events overnight, able to answer simple questions appropriately.  Has NG tube in place - in good spirits  Objective: Vitals: Blood pressure 131/87, pulse 88, temperature 98.5 F (36.9 C), temperature source Oral, resp. rate 20, height 5\' 6"  (1.676 m), weight 79 kg, SpO2 98 %.   Exam: Gen Exam:Alert awake-not in any distress.  Frail appearing. HEENT:atraumatic,  normocephalic Chest: B/L clear to auscultation anteriorly CVS:S1S2 regular Abdomen:soft non tender, non distended Extremities:no edema Neurology: Non focal-generalized weakness. Skin: no rash  Pertinent Labs/Radiology: CBC Latest Ref Rng & Units 05/05/2021 05/04/2021 05/03/2021  WBC 4.0 - 10.5 K/uL 7.1 4.5 5.0  Hemoglobin 12.0 - 15.0 g/dL 11.4(L) 11.4(L) 12.7  Hematocrit 36.0 - 46.0 % 36.6 35.7(L) 40.5  Platelets 150 - 400 K/uL 80(L) 75(L) 77(L)    Lab Results  Component Value Date   NA 138 05/05/2021   K 3.3 (L) 05/05/2021   CL 104 05/05/2021   CO2 26 05/05/2021    Assessment/Plan:  Septic shock due to empyema-s/p thoracocentesis on 2/15, resolved:  Shock/sepsis physiology essentially resolved at this point Cultures remain negative  Continue Zosyn  Chest x-ray 05/03/2021 stable with no reaccumulation, PCCM signing off   Acute metabolic encephalopathy, multifactorial, resolving:  Due to septic shock/AKI/hypernatremia CT head negative Continues to improve daily, not yet back to baseline per family but markedly improving over the past 72 hours  AKI: Likely hemodynamically mediated-resolved with supportive care.  Malnutrition/Moderate Electrolyte disturbances -Given patient's mental status improvement as above discontinue NG tube 05/06/2021 -Advance diet as tolerated, discussed with patient possible need for NG tube replacement if she has poor p.o. intake or is deemed unsafe to take p.o. Hypernatremia: Resolved-stop hypertonic saline. Continue tube feeds and follow Hypokalemia: Replete and recheck. Hypophosphatemia: Replete and recheck. Watch closely for signs of refeeding syndrome.  HTN: Continue amlodipine  NIDDM-2: CBG stable with SSI - Resume oral hypoglycemic agents on discharge.  CAD s/p CABG in 2017 and PCI/DES to RCA 2017: Troponin minimally elevated in the setting of demand ischemia/sepsis, ACS ruled out  Combined chronic systolic/diastolic heart failure:  Resume  fluid restrictions/diuretics once volume status  improves.  Acute urinary retention:  Secondary y to acute illness debility/deconditioning Continue Foley catheter-voiding trial in the next few days.  Remains on Urecholine.  Thrombocytopenia:  Likely in the setting of acute illness Heparin previously discontinued We will follow HIT antibodies (pharmacy evaluation order per protocol)  GERD: Continue PPI  History of psoriatic arthritis: Follows with rheumatology in the outpatient setting.  Debility/deconditioning/functional quadriplegia: Due to acute illness-await PT/OT eval-suspect will require SNF.  Nutrition Status: Nutrition Problem: Moderate Malnutrition Etiology: chronic illness, dysphagia Signs/Symptoms: moderate muscle depletion, moderate fat depletion, percent weight loss (15% weight loss within 6 months) Percent weight loss: 15 % Interventions: Tube feeding, MVI  BMI: Estimated body mass index is 28.11 kg/m as calculated from the following:   Height as of this encounter: 5\' 6"  (1.676 m).   Weight as of this encounter: 79 kg.   Code status:   Code Status: Full Code   DVT Prophylaxis: SCDs Start: 05/01/21 0019   Family Communication: Son [name] updated 05/06/21   Disposition Plan: Status is: Inpatient Remains inpatient appropriate because: Resolving sepsis physiology/resolving encephalopathy-on IV antibiotics-needs several more days of hospitalization to ensure stability and appropriate discharge disposition.  Probably SNF on discharge.    Planned Discharge Destination:Skilled nursing facility   Diet: Diet Order             DIET DYS 2 Room service appropriate? Yes; Fluid consistency: Thin  Diet effective now                     Antimicrobial agents: Anti-infectives (From admission, onward)    Start     Dose/Rate Route Frequency Ordered Stop   05/02/21 1800  piperacillin-tazobactam (ZOSYN) IVPB 3.375 g        3.375 g 12.5 mL/hr over  240 Minutes Intravenous Every 8 hours 05/02/21 1220 05/08/21 2159   05/01/21 2200  ceFEPIme (MAXIPIME) 2 g in sodium chloride 0.9 % 100 mL IVPB  Status:  Discontinued        2 g 200 mL/hr over 30 Minutes Intravenous Every 12 hours 05/01/21 0735 05/01/21 1722   05/01/21 2100  vancomycin (VANCOREADY) IVPB 1500 mg/300 mL  Status:  Discontinued        1,500 mg 150 mL/hr over 120 Minutes Intravenous Every 24 hours 04/30/21 1956 05/01/21 1009   05/01/21 1800  piperacillin-tazobactam (ZOSYN) IVPB 3.375 g  Status:  Discontinued        3.375 g 12.5 mL/hr over 240 Minutes Intravenous Every 8 hours 05/01/21 1722 05/02/21 1220   04/30/21 2000  ceFEPIme (MAXIPIME) 2 g in sodium chloride 0.9 % 100 mL IVPB  Status:  Discontinued        2 g 200 mL/hr over 30 Minutes Intravenous Every 8 hours 04/30/21 1923 05/01/21 0735   04/30/21 1915  vancomycin (VANCOREADY) IVPB 1750 mg/350 mL        1,750 mg 175 mL/hr over 120 Minutes Intravenous  Once 04/30/21 1905 04/30/21 2228   04/30/21 1900  metroNIDAZOLE (FLAGYL) IVPB 500 mg        500 mg 100 mL/hr over 60 Minutes Intravenous  Once 04/30/21 1845 04/30/21 2057        MEDICATIONS: Scheduled Meds:  amLODipine  5 mg Per Tube Daily   aspirin  81 mg Per Tube Daily   atorvastatin  40 mg Per Tube Daily   B-complex with vitamin C  1 tablet Per Tube Daily   bethanechol  10 mg Per Tube TID   chlorhexidine  15 mL Mouth Rinse BID   Chlorhexidine Gluconate Cloth  6 each Topical Daily   feeding supplement (PROSource TF)  45 mL Per Tube BID   free water  200 mL Per Tube Q4H   insulin aspart  0-15 Units Subcutaneous Q4H   mouth rinse  15 mL Mouth Rinse q12n4p   pantoprazole sodium  40 mg Per Tube Daily   pneumococcal 23 valent vaccine  0.5 mL Intramuscular Tomorrow-1000   Continuous Infusions:  feeding supplement (JEVITY 1.2 CAL) 1,000 mL (05/05/21 1900)   piperacillin-tazobactam (ZOSYN)  IV 3.375 g (05/06/21 0710)   PRN Meds:.docusate, fentaNYL (SUBLIMAZE)  injection, polyethylene glycol, polyvinyl alcohol   I have personally reviewed following labs and imaging studies  LABORATORY DATA: CBC: Recent Labs  Lab 04/30/21 1834 04/30/21 2130 05/01/21 0619 05/02/21 0248 05/03/21 0245 05/04/21 0323 05/05/21 0142  WBC 9.7   < > 10.9* 6.9 5.0 4.5 7.1  NEUTROABS 8.4*  --   --  5.8 3.3  --   --   HGB 16.4*   < > 14.1 12.2 12.7 11.4* 11.4*  HCT 53.1*   < > 46.3* 39.3 40.5 35.7* 36.6  MCV 95.5   < > 94.7 94.2 91.6 90.2 91.7  PLT 201   < > 128* 91* 77* 75* 80*   < > = values in this interval not displayed.     Basic Metabolic Panel: Recent Labs  Lab 05/01/21 0619 05/01/21 1211 05/02/21 0248 05/02/21 1916 05/03/21 0245 05/04/21 0323 05/05/21 0142 05/05/21 0741  NA 152* 150* 148*  --  142 140 138  --   K 3.5 3.7 3.3*  --  3.0* 3.4* 3.3*  --   CL 114* 113* 113*  --  107 106 104  --   CO2 20* 20* 27  --  28 25 26   --   GLUCOSE 96 97 173*  --  204* 268* 202*  --   BUN 11 12 12   --  13 8 5*  --   CREATININE 1.24* 1.02* 0.86  --  0.60 0.56 0.45  --   CALCIUM 8.9 8.6* 8.4*  --  7.7* 7.5* 7.8*  --   MG 1.6*  --  2.5*  --  1.8 2.0 1.7  --   PHOS 2.8  --  1.4* 3.1 1.5* 1.1* <1.0* 2.0*     GFR: Estimated Creatinine Clearance: 77.4 mL/min (by C-G formula based on SCr of 0.45 mg/dL).  Liver Function Tests: Recent Labs  Lab 04/30/21 1834 05/01/21 1633 05/04/21 0323  AST 35  --  102*  ALT 15  --  57*  ALKPHOS 49  --  75  BILITOT 1.2  --  0.9  PROT 8.2* 6.7 5.0*  ALBUMIN 3.1*  --  1.9*    No results for input(s): LIPASE, AMYLASE in the last 168 hours. Recent Labs  Lab 05/01/21 0619  AMMONIA 38*     Coagulation Profile: Recent Labs  Lab 04/30/21 1834  INR 1.2     Cardiac Enzymes: No results for input(s): CKTOTAL, CKMB, CKMBINDEX, TROPONINI in the last 168 hours.  BNP (last 3 results) No results for input(s): PROBNP in the last 8760 hours.  Lipid Profile: No results for input(s): CHOL, HDL, LDLCALC, TRIG, CHOLHDL,  LDLDIRECT in the last 72 hours.  Thyroid Function Tests: No results for input(s): TSH, T4TOTAL, FREET4, T3FREE, THYROIDAB in the last 72 hours.   Anemia Panel: No results for input(s): VITAMINB12, FOLATE, FERRITIN, TIBC, IRON, RETICCTPCT in the last  72 hours.   Urine analysis:    Component Value Date/Time   COLORURINE AMBER (A) 04/30/2021 1926   APPEARANCEUR HAZY (A) 04/30/2021 1926   LABSPEC 1.021 04/30/2021 1926   PHURINE 6.0 04/30/2021 1926   GLUCOSEU NEGATIVE 04/30/2021 1926   HGBUR SMALL (A) 04/30/2021 1926   BILIRUBINUR NEGATIVE 04/30/2021 1926   KETONESUR 20 (A) 04/30/2021 1926   PROTEINUR 100 (A) 04/30/2021 1926   UROBILINOGEN 1.0 06/17/2012 1909   NITRITE NEGATIVE 04/30/2021 1926   LEUKOCYTESUR TRACE (A) 04/30/2021 1926    Sepsis Labs: Lactic Acid, Venous    Component Value Date/Time   LATICACIDVEN 5.9 (HH) 05/01/2021 1211    MICROBIOLOGY: Recent Results (from the past 240 hour(s))  Resp Panel by RT-PCR (Flu A&B, Covid) Urine, In & Out Cath     Status: None   Collection Time: 04/30/21  6:36 PM   Specimen: Urine, In & Out Cath; Nasopharyngeal(NP) swabs in vial transport medium  Result Value Ref Range Status   SARS Coronavirus 2 by RT PCR NEGATIVE NEGATIVE Final    Comment: (NOTE) SARS-CoV-2 target nucleic acids are NOT DETECTED.  The SARS-CoV-2 RNA is generally detectable in upper respiratory specimens during the acute phase of infection. The lowest concentration of SARS-CoV-2 viral copies this assay can detect is 138 copies/mL. A negative result does not preclude SARS-Cov-2 infection and should not be used as the sole basis for treatment or other patient management decisions. A negative result may occur with  improper specimen collection/handling, submission of specimen other than nasopharyngeal swab, presence of viral mutation(s) within the areas targeted by this assay, and inadequate number of viral copies(<138 copies/mL). A negative result must be  combined with clinical observations, patient history, and epidemiological information. The expected result is Negative.  Fact Sheet for Patients:  EntrepreneurPulse.com.au  Fact Sheet for Healthcare Providers:  IncredibleEmployment.be  This test is no t yet approved or cleared by the [place] FDA and  has been authorized for detection and/or diagnosis of SARS-CoV-2 by FDA under an Emergency Use Authorization (EUA). This EUA will remain  in effect (meaning this test can be used) for the duration of the COVID-19 declaration under Section 564(b)(1) of the Act, 21 U.S.C.section 360bbb-3(b)(1), unless the authorization is terminated  or revoked sooner.       Influenza A by PCR NEGATIVE NEGATIVE Final   Influenza B by PCR NEGATIVE NEGATIVE Final    Comment: (NOTE) The Xpert Xpress SARS-CoV-2/FLU/RSV plus assay is intended as an aid in the diagnosis of influenza from Nasopharyngeal swab specimens and should not be used as a sole basis for treatment. Nasal washings and aspirates are unacceptable for Xpert Xpress SARS-CoV-2/FLU/RSV testing.  Fact Sheet for Patients: EntrepreneurPulse.com.au  Fact Sheet for Healthcare Providers: IncredibleEmployment.be  This test is not yet approved or cleared by the [place] FDA and has been authorized for detection and/or diagnosis of SARS-CoV-2 by FDA under an Emergency Use Authorization (EUA). This EUA will remain in effect (meaning this test can be used) for the duration of the COVID-19 declaration under Section 564(b)(1) of the Act, 21 U.S.C. section 360bbb-3(b)(1), unless the authorization is terminated or revoked.  Performed at [facility], [place] [address], [place], [place] [address]   Urine Culture     Status: None   Collection Time: 04/30/21  6:44 PM   Specimen: In/Out Cath Urine  Result Value Ref Range Status   Specimen Description IN/OUT CATH  URINE  Final   Special Requests NONE  Final  Culture   Final    NO GROWTH Performed at [facility], [place] [address] [place], [place] [address]    Report Status 05/02/2021 FINAL  Final  Culture, blood (Routine x 2)     Status: None   Collection Time: 04/30/21  6:55 PM   Specimen: BLOOD  Result Value Ref Range Status   Specimen Description BLOOD LEFT ANTECUBITAL  Final   Special Requests   Final    BOTTLES DRAWN AEROBIC AND ANAEROBIC Blood Culture adequate volume   Culture   Final    NO GROWTH 5 DAYS Performed at [facility], [place] [address], [place], [place] [address]    Report Status 05/05/2021 FINAL  Final  MRSA Next Gen by PCR, Nasal     Status: None   Collection Time: 05/01/21  4:14 AM   Specimen: Nasal Mucosa; Nasal Swab  Result Value Ref Range Status   MRSA by PCR Next Gen NOT DETECTED NOT DETECTED Final    Comment: (NOTE) The GeneXpert MRSA Assay (FDA approved for NASAL specimens only), is one component of a comprehensive MRSA colonization surveillance program. It is not intended to diagnose MRSA infection nor to guide or monitor treatment for MRSA infections. Test performance is not FDA approved in patients less than 43 years old. Performed at [facility], [place] [address] [place], [place] [address]   Acid Fast Smear (AFB)     Status: None   Collection Time: 05/01/21  4:13 PM   Specimen: Pleural  Result Value Ref Range Status   AFB Specimen Processing Concentration  Final   Acid Fast Smear Negative  Final    Comment: (NOTE) Performed At: [facility] [place], [place] [id number] Rush Farmer MD [id number]    Source (AFB) FLUID  Final    Comment: Performed at [facility], [place] [address], [place], [place] [address]  Body fluid culture w Gram Stain     Status: None   Collection Time: 05/01/21  4:21 PM   Specimen: Pleural Fluid  Result Value Ref Range Status   Specimen Description PLEURAL  Final   Special  Requests NONE  Final   Gram Stain   Final    RARE WBC PRESENT, PREDOMINANTLY MONONUCLEAR NO ORGANISMS SEEN    Culture   Final    NO GROWTH 3 DAYS Performed at [facility], [place] [address] [place],  [address]    Report Status 05/05/2021 FINAL  Final    RADIOLOGY STUDIES/RESULTS: No results found.   LOS: 5 days   Little Ishikawa, DO  [facility]    To contact the attending provider between 7A-7P or the covering provider during after hours 7P-7A, please log into the web site [email or web address] and access using universal [place] password for that web site. If you do not have the password, please call the hospital operator.  05/06/2021, 7:29 AM

## 2021-05-06 NOTE — TOC Progression Note (Addendum)
Transition of Care [facility]) - Progression Note    Patient Details  Name: [name] [name] MRN: [id number] Date of Birth: [birth year]  Transition of [facility]) CM/SW [place], [place] Phone Number: 05/06/2021, 2:02 PM  Clinical Narrative:     Met with pt and provided SNF bed offers. She appears a little confused and requested CSW to contact one of her sons to help with the decision.   CSW called pt son [name] and provided SNF bed offers. Son states that [facility] is their preference as his sister works there.  Contacted [facility]; awaiting response.    28: CSW informed that [facility] doesn't currently have beds but may have some later this week.    Expected Discharge Plan: Skilled Nursing Facility Barriers to Discharge: SNF Pending bed offer  Expected Discharge Plan and Services Expected Discharge Plan: [place] In-house Referral: Clinical Social Work   Post Acute Care Choice: [place] Living arrangements for the past 2 months: Apartment                                       Social Determinants of Health (SDOH) Interventions    Readmission Risk Interventions No flowsheet data found.

## 2021-05-07 LAB — CBC
HCT: 36.2 % (ref 36.0–46.0)
Hemoglobin: 11.7 g/dL — ABNORMAL LOW (ref 12.0–15.0)
MCH: 29.1 pg (ref 26.0–34.0)
MCHC: 32.3 g/dL (ref 30.0–36.0)
MCV: 90 fL (ref 80.0–100.0)
Platelets: 127 10*3/uL — ABNORMAL LOW (ref 150–400)
RBC: 4.02 MIL/uL (ref 3.87–5.11)
RDW: 16.5 % — ABNORMAL HIGH (ref 11.5–15.5)
WBC: 4.4 10*3/uL (ref 4.0–10.5)
nRBC: 0 % (ref 0.0–0.2)

## 2021-05-07 LAB — GLUCOSE, CAPILLARY
Glucose-Capillary: 114 mg/dL — ABNORMAL HIGH (ref 70–99)
Glucose-Capillary: 172 mg/dL — ABNORMAL HIGH (ref 70–99)
Glucose-Capillary: 250 mg/dL — ABNORMAL HIGH (ref 70–99)
Glucose-Capillary: 251 mg/dL — ABNORMAL HIGH (ref 70–99)
Glucose-Capillary: 73 mg/dL (ref 70–99)
Glucose-Capillary: 76 mg/dL (ref 70–99)
Glucose-Capillary: 91 mg/dL (ref 70–99)

## 2021-05-07 LAB — PHOSPHORUS: Phosphorus: 3.2 mg/dL (ref 2.5–4.6)

## 2021-05-07 LAB — BASIC METABOLIC PANEL
Anion gap: 8 (ref 5–15)
BUN: <5 mg/dL — ABNORMAL LOW (ref 8–23)
CO2: 29 mmol/L (ref 22–32)
Calcium: 8.8 mg/dL — ABNORMAL LOW (ref 8.9–10.3)
Chloride: 104 mmol/L (ref 98–111)
Creatinine, Ser: 0.54 mg/dL (ref 0.44–1.00)
GFR, Estimated: >60 mL/min (ref >60)
Glucose, Bld: 82 mg/dL (ref 70–99)
Potassium: 3.4 mmol/L — ABNORMAL LOW (ref 3.5–5.1)
Sodium: 141 mmol/L (ref 135–145)

## 2021-05-07 MED ORDER — POTASSIUM CHLORIDE CRYS ER 20 MEQ PO TBCR
40.0000 meq | EXTENDED_RELEASE_TABLET | Freq: Once | ORAL | Status: AC
  Administered 2021-05-07: 40 meq via ORAL
  Filled 2021-05-07: qty 2

## 2021-05-07 MED ORDER — IBUPROFEN 600 MG PO TABS
600.0000 mg | ORAL_TABLET | Freq: Once | ORAL | Status: AC
  Administered 2021-05-07: 600 mg via ORAL
  Filled 2021-05-07: qty 1

## 2021-05-07 MED ORDER — POTASSIUM CHLORIDE CRYS ER 20 MEQ PO TBCR
20.0000 meq | EXTENDED_RELEASE_TABLET | Freq: Once | ORAL | Status: AC
  Administered 2021-05-07: 20 meq via ORAL
  Filled 2021-05-07: qty 1

## 2021-05-07 MED ORDER — MAGNESIUM SULFATE 2 GM/50ML IV SOLN
2.0000 g | Freq: Once | INTRAVENOUS | Status: AC
  Administered 2021-05-07: 2 g via INTRAVENOUS
  Filled 2021-05-07: qty 50

## 2021-05-07 MED ORDER — AMOXICILLIN-POT CLAVULANATE 875-125 MG PO TABS
1.0000 | ORAL_TABLET | Freq: Two times a day (BID) | ORAL | Status: DC
  Administered 2021-05-07 – 2021-05-09 (×5): 1 via ORAL
  Filled 2021-05-07 (×5): qty 1

## 2021-05-07 MED ORDER — LOPERAMIDE HCL 2 MG PO CAPS: 2.0000 mg | ORAL_CAPSULE | ORAL | Status: DC | PRN

## 2021-05-07 MED ORDER — ONDANSETRON HCL 4 MG/2ML IJ SOLN
4.0000 mg | Freq: Four times a day (QID) | INTRAMUSCULAR | Status: DC | PRN
  Administered 2021-05-07: 4 mg via INTRAVENOUS
  Filled 2021-05-07: qty 2

## 2021-05-07 NOTE — Progress Notes (Signed)
PROGRESS NOTE        PATIENT DETAILS Name: [name] [name] Age: 61 y.o. Sex: female Date of Birth: 1960/10/22 Admit Date: 04/30/2021 Admitting Physician Collier Bullock, MD QJJ:[id number], Ronie Spies, MD  Brief Summary: Patient is a 61 y.o.  female with history of psoriatic arthritis, DM-2, HTN, CAD s/p CABG-presented to the hospital on 2/14 with confusion, hypotension, fever-she was found to have septic shock due to empyema-monitor closely in the [place] thoracocentesis-improved-and subsequently transferred to the [facility] service.  See below for further details.  Significant Hospital events: 2/14>> admit to ICU-confused/hypotensive/febrile-severe sepsis/septic shock-from empyema.  Numerous electrolyte abnormalities. 2/15>> thoracocentesis 2/17>> transfer to [facility]  Significant imaging studies: 2/14>> CT head: No acute abnormality 2/14>> CXR: Small volume right pleural effusion 2/15>> renal ultrasound: No hydronephrosis 2/15>> Echo: EF 18%, grade 1 diastolic dysfunction 5/63>> CXR: Mild right basilar atelectasis/infiltrate with small right pleural effusion.  Significant microbiology data: 2/14>> COVID/influenza PCR: Negative 2/14>> blood culture: Negative 2/14>> urine culture: Negative 2/15>> pleural fluid culture: No growth 2/15>> pleural fluid AFB smear: Negative 2/15>> pleural fluid AFB culture: Pending  Procedures: 2/15>> thoracocentesis  Consults: PCCM  Subjective: Lying comfortably in bed-no chest pain or shortness of breath.  Objective: Vitals: Blood pressure 107/73, pulse 82, temperature 97.7 F (36.5 C), temperature source Oral, resp. rate 19, height 5\' 6"  (1.676 m), weight 70.5 kg, SpO2 100 %.   Exam: Gen Exam: Frail/chronically sick appearing-not in any distress. HEENT:atraumatic, normocephalic Chest: B/L clear to auscultation anteriorly CVS:S1S2 regular Abdomen:soft non tender, non distended Extremities:no edema Neurology: Non  focal Skin: no rash   Pertinent Labs/Radiology: CBC Latest Ref Rng & Units 05/07/2021 05/05/2021 05/04/2021  WBC 4.0 - 10.5 K/uL 4.4 7.1 4.5  Hemoglobin 12.0 - 15.0 g/dL 11.7(L) 11.4(L) 11.4(L)  Hematocrit 36.0 - 46.0 % 36.2 36.6 35.7(L)  Platelets 150 - 400 K/uL 127(L) 80(L) 75(L)    Lab Results  Component Value Date   NA 141 05/07/2021   K 3.4 (L) 05/07/2021   CL 104 05/07/2021   CO2 29 05/07/2021    Assessment/Plan: Septic shock due to empyema-s/p thoracocentesis on 2/15: Sepsis physiology has resolved-pleural fluid cultures remain negative so far-repeat CXR on 2/17 without any accumulation.  PCCM has signed off-remains on Zosyn-we will discuss with ID regarding length of therapy.  Acute metabolic encephalopathy: Multifactorial etiology due to sepsis/AKI and hypernatremia.  CT head negative.  Although slow-significantly improved over the past few days.  AKI: Likely hemodynamically mediated-resolved with supportive care.  Hypokalemia/hypokalemia/hypophosphatemia: Continue to replete-recheck periodically.  Hyponatremia: Resolved.  HTN: Continue amlodipine  NIDDM-2 (A1c 6.7 on 2/14): CBG stable with SSI - Resume oral hypoglycemic agents on discharge.  Recent Labs    05/07/21 0352 05/07/21 0745 05/07/21 1207  GLUCAP 73 114* 172*     CAD s/p CABG in 2017 and PCI/DES to RCA 2017:Troponin minimally elevated in the setting of demand ischemia/sepsis, ACS ruled out  Combined chronic systolic/diastolic heart failure: Resume fluid restrictions/diuretics once volume status improves.  Acute urinary retention: Secondary to acute illness debility/deconditioning-remove Foley catheter today and attempt a voiding trial.  Continue Urecholine.  Thrombocytopenia: Likely due to sepsis-improving.  GERD: Continue PPI  History of psoriatic arthritis: Follows with rheumatology in the outpatient setting.  Debility/deconditioning/functional quadriplegia: Due to acute illness-await PT/OT  eval-suspect will require SNF.  Nutrition Status: Nutrition Problem: Moderate Malnutrition Etiology: chronic illness, dysphagia Signs/Symptoms: moderate  muscle depletion, moderate fat depletion, percent weight loss (15% weight loss within 6 months) Percent weight loss: 15 % Interventions: MVI, Ensure Enlive (each supplement provides 350kcal and 20 grams of protein)  BMI: Estimated body mass index is 25.09 kg/m as calculated from the following:   Height as of this encounter: 5\' 6"  (1.676 m).   Weight as of this encounter: 70.5 kg.   Code status:   Code Status: Full Code   DVT Prophylaxis: SCDs Start: 05/01/21 0019   Family Communication: Son [name] updated 05/06/21   Disposition Plan: Status is: Inpatient Remains inpatient appropriate because: Resolving sepsis physiology/resolving encephalopathy-on IV antibiotics-needs several more days of hospitalization to ensure stability and appropriate discharge disposition.  Probably SNF on discharge.    Planned Discharge Destination:Skilled nursing facility   Diet: Diet Order             DIET DYS 3 Room service appropriate? Yes; Fluid consistency: Thin  Diet effective now                     Antimicrobial agents: Anti-infectives (From admission, onward)    Start     Dose/Rate Route Frequency Ordered Stop   05/02/21 1800  piperacillin-tazobactam (ZOSYN) IVPB 3.375 g        3.375 g 12.5 mL/hr over 240 Minutes Intravenous Every 8 hours 05/02/21 1220 05/08/21 2159   05/01/21 2200  ceFEPIme (MAXIPIME) 2 g in sodium chloride 0.9 % 100 mL IVPB  Status:  Discontinued        2 g 200 mL/hr over 30 Minutes Intravenous Every 12 hours 05/01/21 0735 05/01/21 1722   05/01/21 2100  vancomycin (VANCOREADY) IVPB 1500 mg/300 mL  Status:  Discontinued        1,500 mg 150 mL/hr over 120 Minutes Intravenous Every 24 hours 04/30/21 1956 05/01/21 1009   05/01/21 1800  piperacillin-tazobactam (ZOSYN) IVPB 3.375 g  Status:   Discontinued        3.375 g 12.5 mL/hr over 240 Minutes Intravenous Every 8 hours 05/01/21 1722 05/02/21 1220   04/30/21 2000  ceFEPIme (MAXIPIME) 2 g in sodium chloride 0.9 % 100 mL IVPB  Status:  Discontinued        2 g 200 mL/hr over 30 Minutes Intravenous Every 8 hours 04/30/21 1923 05/01/21 0735   04/30/21 1915  vancomycin (VANCOREADY) IVPB 1750 mg/350 mL        1,750 mg 175 mL/hr over 120 Minutes Intravenous  Once 04/30/21 1905 04/30/21 2228   04/30/21 1900  metroNIDAZOLE (FLAGYL) IVPB 500 mg        500 mg 100 mL/hr over 60 Minutes Intravenous  Once 04/30/21 1845 04/30/21 2057        MEDICATIONS: Scheduled Meds:  amLODipine  5 mg Per Tube Daily   aspirin  81 mg Per Tube Daily   atorvastatin  40 mg Per Tube Daily   B-complex with vitamin C  1 tablet Per Tube Daily   bethanechol  10 mg Per Tube TID   chlorhexidine  15 mL Mouth Rinse BID   Chlorhexidine Gluconate Cloth  6 each Topical Daily   feeding supplement  237 mL Oral BID BM   insulin aspart  0-15 Units Subcutaneous Q4H   mouth rinse  15 mL Mouth Rinse q12n4p   pantoprazole sodium  40 mg Per Tube Daily   Continuous Infusions:  piperacillin-tazobactam (ZOSYN)  IV 12.5 mL/hr at 05/07/21 0600   PRN Meds:.docusate, lip balm, ondansetron (ZOFRAN) IV, polyethylene glycol, polyvinyl  alcohol   I have personally reviewed following labs and imaging studies  LABORATORY DATA: CBC: Recent Labs  Lab 04/30/21 1834 04/30/21 2130 05/02/21 0248 05/03/21 0245 05/04/21 0323 05/05/21 0142 05/07/21 0118  WBC 9.7   < > 6.9 5.0 4.5 7.1 4.4  NEUTROABS 8.4*  --  5.8 3.3  --   --   --   HGB 16.4*   < > 12.2 12.7 11.4* 11.4* 11.7*  HCT 53.1*   < > 39.3 40.5 35.7* 36.6 36.2  MCV 95.5   < > 94.2 91.6 90.2 91.7 90.0  PLT 201   < > 91* 77* 75* 80* 127*   < > = values in this interval not displayed.     Basic Metabolic Panel: Recent Labs  Lab 05/01/21 0619 05/01/21 1211 05/02/21 0248 05/02/21 1916 05/03/21 0245  05/04/21 0323 05/05/21 0142 05/05/21 0741 05/07/21 0118  NA 152*   < > 148*  --  142 140 138  --  141  K 3.5   < > 3.3*  --  3.0* 3.4* 3.3*  --  3.4*  CL 114*   < > 113*  --  107 106 104  --  104  CO2 20*   < > 27  --  28 25 26   --  29  GLUCOSE 96   < > 173*  --  204* 268* 202*  --  82  BUN 11   < > 12  --  13 8 5*  --  <5*  CREATININE 1.24*   < > 0.86  --  0.60 0.56 0.45  --  0.54  CALCIUM 8.9   < > 8.4*  --  7.7* 7.5* 7.8*  --  8.8*  MG 1.6*  --  2.5*  --  1.8 2.0 1.7  --   --   PHOS 2.8  --  1.4*   < > 1.5* 1.1* <1.0* 2.0* 3.2   < > = values in this interval not displayed.     GFR: Estimated Creatinine Clearance: 68.3 mL/min (by C-G formula based on SCr of 0.54 mg/dL).  Liver Function Tests: Recent Labs  Lab 04/30/21 1834 05/01/21 1633 05/04/21 0323  AST 35  --  102*  ALT 15  --  57*  ALKPHOS 49  --  75  BILITOT 1.2  --  0.9  PROT 8.2* 6.7 5.0*  ALBUMIN 3.1*  --  1.9*    No results for input(s): LIPASE, AMYLASE in the last 168 hours. Recent Labs  Lab 05/01/21 0619  AMMONIA 38*     Coagulation Profile: Recent Labs  Lab 04/30/21 1834  INR 1.2     Cardiac Enzymes: No results for input(s): CKTOTAL, CKMB, CKMBINDEX, TROPONINI in the last 168 hours.  BNP (last 3 results) No results for input(s): PROBNP in the last 8760 hours.  Lipid Profile: No results for input(s): CHOL, HDL, LDLCALC, TRIG, CHOLHDL, LDLDIRECT in the last 72 hours.  Thyroid Function Tests: No results for input(s): TSH, T4TOTAL, FREET4, T3FREE, THYROIDAB in the last 72 hours.   Anemia Panel: No results for input(s): VITAMINB12, FOLATE, FERRITIN, TIBC, IRON, RETICCTPCT in the last 72 hours.   Urine analysis:    Component Value Date/Time   COLORURINE AMBER (A) 04/30/2021 1926   APPEARANCEUR HAZY (A) 04/30/2021 1926   LABSPEC 1.021 04/30/2021 1926   PHURINE 6.0 04/30/2021 1926   GLUCOSEU NEGATIVE 04/30/2021 1926   HGBUR SMALL (A) 04/30/2021 [place] NEGATIVE 04/30/2021  1926  KETONESUR 20 (A) 04/30/2021 1926   PROTEINUR 100 (A) 04/30/2021 1926   UROBILINOGEN 1.0 06/17/2012 1909   NITRITE NEGATIVE 04/30/2021 1926   LEUKOCYTESUR TRACE (A) 04/30/2021 1926    Sepsis Labs: Lactic Acid, Venous    Component Value Date/Time   LATICACIDVEN 5.9 (HH) 05/01/2021 1211    MICROBIOLOGY: Recent Results (from the past 240 hour(s))  Resp Panel by RT-PCR (Flu A&B, Covid) Urine, In & Out Cath     Status: None   Collection Time: 04/30/21  6:36 PM   Specimen: Urine, In & Out Cath; Nasopharyngeal(NP) swabs in vial transport medium  Result Value Ref Range Status   SARS Coronavirus 2 by RT PCR NEGATIVE NEGATIVE Final    Comment: (NOTE) SARS-CoV-2 target nucleic acids are NOT DETECTED.  The SARS-CoV-2 RNA is generally detectable in upper respiratory specimens during the acute phase of infection. The lowest concentration of SARS-CoV-2 viral copies this assay can detect is 138 copies/mL. A negative result does not preclude SARS-Cov-2 infection and should not be used as the sole basis for treatment or other patient management decisions. A negative result may occur with  improper specimen collection/handling, submission of specimen other than nasopharyngeal swab, presence of viral mutation(s) within the areas targeted by this assay, and inadequate number of viral copies(<138 copies/mL). A negative result must be combined with clinical observations, patient history, and epidemiological information. The expected result is Negative.  Fact Sheet for Patients:  EntrepreneurPulse.com.au  Fact Sheet for Healthcare Providers:  IncredibleEmployment.be  This test is no t yet approved or cleared by the [place] FDA and  has been authorized for detection and/or diagnosis of SARS-CoV-2 by FDA under an Emergency Use Authorization (EUA). This EUA will remain  in effect (meaning this test can be used) for the duration of the COVID-19  declaration under Section 564(b)(1) of the Act, 21 U.S.C.section 360bbb-3(b)(1), unless the authorization is terminated  or revoked sooner.       Influenza A by PCR NEGATIVE NEGATIVE Final   Influenza B by PCR NEGATIVE NEGATIVE Final    Comment: (NOTE) The Xpert Xpress SARS-CoV-2/FLU/RSV plus assay is intended as an aid in the diagnosis of influenza from Nasopharyngeal swab specimens and should not be used as a sole basis for treatment. Nasal washings and aspirates are unacceptable for Xpert Xpress SARS-CoV-2/FLU/RSV testing.  Fact Sheet for Patients: EntrepreneurPulse.com.au  Fact Sheet for Healthcare Providers: IncredibleEmployment.be  This test is not yet approved or cleared by the [place] FDA and has been authorized for detection and/or diagnosis of SARS-CoV-2 by FDA under an Emergency Use Authorization (EUA). This EUA will remain in effect (meaning this test can be used) for the duration of the COVID-19 declaration under Section 564(b)(1) of the Act, 21 U.S.C. section 360bbb-3(b)(1), unless the authorization is terminated or revoked.  Performed at [facility], [address], [place], [place] [address]   Urine Culture     Status: None   Collection Time: 04/30/21  6:44 PM   Specimen: In/Out Cath Urine  Result Value Ref Range Status   Specimen Description IN/OUT CATH URINE  Final   Special Requests NONE  Final   Culture   Final    NO GROWTH Performed at [facility], [place] [address] [place], [place] [address]    Report Status 05/02/2021 FINAL  Final  Culture, blood (Routine x 2)     Status: None   Collection Time: 04/30/21  6:55 PM   Specimen: BLOOD  Result Value  Ref Range Status   Specimen Description BLOOD LEFT ANTECUBITAL  Final   Special Requests   Final    BOTTLES DRAWN AEROBIC AND ANAEROBIC Blood Culture adequate volume   Culture   Final    NO GROWTH 5 DAYS Performed at [facility], [address], [place], [place] [address]    Report Status 05/05/2021 FINAL  Final  MRSA Next Gen by PCR, Nasal     Status: None   Collection Time: 05/01/21  4:14 AM   Specimen: Nasal Mucosa; Nasal Swab  Result Value Ref Range Status   MRSA by PCR Next Gen NOT DETECTED NOT DETECTED Final    Comment: (NOTE) The GeneXpert MRSA Assay (FDA approved for NASAL specimens only), is one component of a comprehensive MRSA colonization surveillance program. It is not intended to diagnose MRSA infection nor to guide or monitor treatment for MRSA infections. Test performance is not FDA approved in patients less than 7 years old. Performed at [facility], [place] [address] [place], [place] [address]   Acid Fast Smear (AFB)     Status: None   Collection Time: 05/01/21  4:13 PM   Specimen: Pleural  Result Value Ref Range Status   AFB Specimen Processing Concentration  Final   Acid Fast Smear Negative  Final    Comment: (NOTE) Performed At: [facility] [place], [place] [id number] Rush Farmer MD [id number]    Source (AFB) FLUID  Final    Comment: Performed at [facility], [place] [address], [place], [place] [address]  Body fluid culture w Gram Stain     Status: None   Collection Time: 05/01/21  4:21 PM   Specimen: Pleural Fluid  Result Value Ref Range Status   Specimen Description PLEURAL  Final   Special Requests NONE  Final   Gram Stain   Final    RARE WBC PRESENT, PREDOMINANTLY MONONUCLEAR NO ORGANISMS SEEN    Culture   Final    NO GROWTH 3 DAYS Performed at [facility], [place] [address], [place],  [address]    Report Status 05/05/2021 FINAL  Final    RADIOLOGY STUDIES/RESULTS: DG Abd Portable 1V  Result Date: 05/06/2021 CLINICAL DATA:  Nasogastric tube placement. EXAM: PORTABLE ABDOMEN - 1 VIEW COMPARISON:  05/01/2021. FINDINGS: Interval removal of the feeding tube and placement of a nasogastric tube with the side port several cm beyond the  gastroesophageal junction. Gas is seen in nondilated small bowel and colon. Degenerative changes in the spine and hips. IMPRESSION: Nasogastric tube terminates in the stomach. Electronically Signed   By: Lorin Picket M.D.   On: 05/06/2021 08:19     LOS: 6 days   Aaryan Essman, DO  [facility]    To contact the attending provider between 7A-7P or the covering provider during after hours 7P-7A, please log into the web site [email or web address] and access using universal [place] password for that web site. If you do not have the password, please call the hospital operator.  05/07/2021, 1:23 PM

## 2021-05-07 NOTE — TOC Progression Note (Addendum)
Transition of Care [facility]) - Progression Note    Patient Details  Name: AVIKA CARBINE MRN: [id number] Date of Birth: [birth year]  Transition of [facility]) CM/SW [place], LCSW Phone Number: 05/07/2021, 8:54 AM  Clinical Narrative:    CSW sent request to [facility] to start insurance authorization (not managed by [facility]) in hopes that they will have a bed opening soon. Awaiting response.   Update: Helene Kelp will start insurance authorization once therapy notes are updated in anticipation of patient being medically ready soon.    Expected Discharge Plan: Skilled Nursing Facility Barriers to Discharge: SNF Pending bed offer  Expected Discharge Plan and Services Expected Discharge Plan: [place] In-house Referral: Clinical Social Work   Post Acute Care Choice: [place] Living arrangements for the past 2 months: Apartment                                       Social Determinants of Health (SDOH) Interventions    Readmission Risk Interventions No flowsheet data found.

## 2021-05-07 NOTE — Progress Notes (Signed)
Physical Therapy Treatment Patient Details Name: [name] [name] MRN: [id number] DOB: 18-Aug-1960 Today's Date: 05/07/2021   History of Present Illness 61 y/o female found by her son 2/14 incontinent of urine and altered. CT head negative, hypernatremia, fever; encephalopathy, septic shock due to empyema. 2/15 thoracentesis. PMH: DM on Metformin, RA with chronic pain, HTN, HL, CAD s/p CABG, UTI, CVA, recent 30 lb weight loss and functional decline.    PT Comments    Pt received in supine, agreeable to therapy session and with good effort to perform functional mobility tasks. Pt with c/o "room spinning" with all mobility, including rolling to L/R sides, sitting EOB and brief standing trial, RN notified. Pt BP stable from supine>sit and UTA standing BP due to weakness and pt bowel incontinence. Pt needing mod to maxA for bed mobility and transfer training. Increased time/effort to initiate and perform all tasks due to slow processing and needing multimodal cues for safety/proper sequencing. Pt continues to benefit from PT services to progress toward functional mobility goals, plan to bring HEP next session for teach-back.   Recommendations for follow up therapy are one component of a multi-disciplinary discharge planning process, led by the attending physician.  Recommendations may be updated based on patient status, additional functional criteria and insurance authorization.  Follow Up Recommendations  Skilled nursing-short term rehab (<3 hours/day)     Assistance Recommended at Discharge Frequent or constant Supervision/Assistance  Patient can return home with the following Two people to help with walking and/or transfers;Two people to help with bathing/dressing/bathroom;Direct supervision/assist for medications management;Assistance with cooking/housework;Assistance with feeding;Direct supervision/assist for financial management;Help with stairs or ramp for entrance   Equipment Recommendations   None recommended by PT    Recommendations for Other Services       Precautions / Restrictions Precautions Precautions: Fall;Other (comment) Precaution Comments: foley catheter Restrictions Weight Bearing Restrictions: No     Mobility  Bed Mobility Overal bed mobility: Needs Assistance Bed Mobility: Rolling, Sidelying to Sit, Sit to Sidelying Rolling: Min guard Sidelying to sit: Mod assist, HOB elevated     Sit to sidelying: Mod assist General bed mobility comments: good technique for rolling with increased time and good use of bed rails; pt needing heavy trunk assist to push upright to sit on L EOB and for steadying trunk once upright, c/o dizziness; modA for BLE to return to sidelying from EOB    Transfers Overall transfer level: Needs assistance Equipment used: Rolling walker (2 wheels) Transfers: Sit to/from Stand Sit to Stand: Max assist, From elevated surface          Lateral/Scoot Transfers: Max assist General transfer comment: pt able to lift hips off bed and stand to RW in crouched position ~5 seconds but limited due to posterior bias and fatigue, needed to sit back down, too fatigued to re-trial after this; HR to 122 bpm and SpO2 WFL with exertion.     Balance Overall balance assessment: Needs assistance Sitting-balance support: Bilateral upper extremity supported, Feet supported Sitting balance-Leahy Scale: Poor Sitting balance - Comments: intermittent anterior/posterior leans, pt c/o vertigo sx throughout, variable min guard to minA for trunk support Postural control: Posterior lean Standing balance support: Bilateral upper extremity supported Standing balance-Leahy Scale: Zero Standing balance comment: ~5 seconds standing to RW only, crouched posture, posterior LOB needing to sit back down.                            Cognition Arousal/Alertness:  Awake/alert Behavior During Therapy: Flat affect Overall Cognitive Status: Impaired/Different from  baseline Area of Impairment: Orientation, Attention, Following commands, Awareness, Problem solving, Safety/judgement                 Orientation Level: Time, Situation, Disoriented to Current Attention Level: Sustained   Following Commands: Follows one step commands with increased time Safety/Judgement: Decreased awareness of deficits, Decreased awareness of safety Awareness: Intellectual Problem Solving: Slow processing, Decreased initiation, Difficulty sequencing, Requires verbal cues, Requires tactile cues General Comments: required incr time for all commands; repetition of cues in addition to multi-modal cues. Pt tangential at times and needs reminders for attention to task; pt reports chronic dizziness when rolling to L side but no visible nystagmus.        Exercises Other Exercises Other Exercises: supine BLE AROM: ankle pumps/circles, heel slides, hip abduction x5-10 reps ea    General Comments General comments (skin integrity, edema, etc.): BP 107/73 (84) supine and BP 115/73 (80) sitting EOB, dizziness throughout session (with rolling, sitting and standing); HR 98 bpm with sitting and up to 122 bpm with standing trial; SpO2 92-98% on RA      Pertinent Vitals/Pain Pain Assessment Pain Assessment: Faces Faces Pain Scale: Hurts a little bit Pain Location: "all over", pt unable to localize Pain Descriptors / Indicators: Discomfort Pain Intervention(s): Monitored during session, Repositioned, Limited activity within patient's tolerance           PT Goals (current goals can now be found in the care plan section) Acute Rehab PT Goals Patient Stated Goal: To get stronger, to show my son how I am doing (pt son works during the day) PT Goal Formulation: With patient Time For Goal Achievement: 05/18/21 Progress towards PT goals: Progressing toward goals    Frequency    Min 3X/week      PT Plan Current plan remains appropriate    Co-evaluation               AM-PAC PT "6 Clicks" Mobility   Outcome Measure  Help needed turning from your back to your side while in a flat bed without using bedrails?: A Little Help needed moving from lying on your back to sitting on the side of a flat bed without using bedrails?: A Lot Help needed moving to and from a bed to a chair (including a wheelchair)?: Total Help needed standing up from a chair using your arms (e.g., wheelchair or bedside chair)?: Total Help needed to walk in hospital room?: Total Help needed climbing 3-5 steps with a railing? : Total 6 Click Score: 9    End of Session Equipment Utilized During Treatment: Gait belt Activity Tolerance: Patient limited by fatigue;Treatment limited secondary to medical complications (Comment) (sx dizziness (pt reports this is a chronic issue? she did not mention it in previous session)) Patient left: in bed;with call bell/phone within reach;with bed alarm set;with nursing/sitter in room;Other (comment) (NT in room to assist with hygiene) Nurse Communication: Mobility status;Other (comment) (pt dizziness but BP stable supine>sit; pt bowel incontinence) PT Visit Diagnosis: Muscle weakness (generalized) (M62.81);History of falling (Z91.81);Difficulty in walking, not elsewhere classified (R26.2)     Time: 1950-9326 PT Time Calculation (min) (ACUTE ONLY): 37 min  Charges:  $Therapeutic Activity: 23-37 mins                     [name] Blaize P., PTA Acute Rehabilitation Services Pager: [phone] Office: [place] 05/07/2021, 2:10 PM

## 2021-05-07 NOTE — Progress Notes (Signed)
Speech Language Pathology Treatment: Dysphagia  Patient Details Name: RAIVYN KABLER MRN: [id number] DOB: [birth year] Today's Date: 05/07/2021 Time: 08-1438 SLP Time Calculation (min) (ACUTE ONLY): 20 min  Assessment / Plan / Recommendation Clinical Impression  Patient seen by SLP for skilled treatment session focused on dysphagia goals. Patient was awake and alert and nurse in room preparing to give her some oral medicaitons. Patient reported that her swallowing has been improving but she is still having difficulty swallowing meat. Of note, SLP observed that patient was easily distracted and verbose, requiring cues to initiate taking sips and finishing her medication. No immediate cough or throat clear but patient did exhibit delayed dry sounding cough response following taking medications crushed in puree and sips of supplement shake and sips of water. Patient's voice is mildly hoarse but clear throughout. SLP is recommending to continue on current Dys 3 solids, thin liquids diet and patient encouraged to try and eat more solids at meal time. SLP will continue to follow patient for toleration and readiness to upgrade solid textures.    HPI HPI: Patient is a 61 y.o. female with PMH: DM, RA with chronic pain, HTN, HL, CAD s/p CABG. She was found on 2/14 by her son incontinent of urine and with AMS. EMS was called and patient initially intermittently responsive to pain and voice, initially hypotensive. Son reported to medical staff that patient was exhibiting a gradual worsening over last couple months with weight loss (30 lb), aching pain all over and swallowing issues (sensation of food getting caught in throat). She fell last week and was seen in ED on 2/9 and discharged home. Son also reported unsure if patient taking her medications correctly. In ED, BP improved, CT head negative, initially febrile at 100.9. CXR revealed  small volume right pleural effusion. Paitent was made NPO and speech swallow  evaluation ordered.      SLP Plan  Continue with current plan of care      Recommendations for follow up therapy are one component of a multi-disciplinary discharge planning process, led by the attending physician.  Recommendations may be updated based on patient status, additional functional criteria and insurance authorization.    Recommendations  Diet recommendations: Dysphagia 3 (mechanical soft);Thin liquid Liquids provided via: Cup;Straw Medication Administration: Whole meds with liquid Supervision: Staff to assist with self feeding;Patient able to self feed Compensations: Slow rate;Small sips/bites;Minimize environmental distractions Postural Changes and/or Swallow Maneuvers: Seated upright 90 degrees                Oral Care Recommendations: Oral care BID Follow Up Recommendations: Skilled nursing-short term rehab (<3 hours/day) Assistance recommended at discharge: Frequent or constant Supervision/Assistance SLP Visit Diagnosis: Dysphagia, unspecified (R13.10) Plan: Continue with current plan of care         Sonia Baller, MA, CCC-SLP Speech Therapy

## 2021-05-08 DIAGNOSIS — E871 Hypo-osmolality and hyponatremia: Secondary | ICD-10-CM

## 2021-05-08 LAB — BASIC METABOLIC PANEL
Anion gap: 8 (ref 5–15)
BUN: 5 mg/dL — ABNORMAL LOW (ref 8–23)
CO2: 26 mmol/L (ref 22–32)
Calcium: 8.8 mg/dL — ABNORMAL LOW (ref 8.9–10.3)
Chloride: 109 mmol/L (ref 98–111)
Creatinine, Ser: 0.6 mg/dL (ref 0.44–1.00)
GFR, Estimated: >60 mL/min (ref >60)
Glucose, Bld: 65 mg/dL — ABNORMAL LOW (ref 70–99)
Potassium: 4.2 mmol/L (ref 3.5–5.1)
Sodium: 143 mmol/L (ref 135–145)

## 2021-05-08 LAB — VITAMIN B1: Vitamin B1 (Thiamine): 41.2 nmol/L — ABNORMAL LOW (ref 66.5–200.0)

## 2021-05-08 LAB — GLUCOSE, CAPILLARY
Glucose-Capillary: 164 mg/dL — ABNORMAL HIGH (ref 70–99)
Glucose-Capillary: 59 mg/dL — ABNORMAL LOW (ref 70–99)
Glucose-Capillary: 136 mg/dL — ABNORMAL HIGH (ref 70–99)
Glucose-Capillary: 238 mg/dL — ABNORMAL HIGH (ref 70–99)
Glucose-Capillary: 317 mg/dL — ABNORMAL HIGH (ref 70–99)
Glucose-Capillary: 289 mg/dL — ABNORMAL HIGH (ref 70–99)

## 2021-05-08 LAB — MAGNESIUM: Magnesium: 2.1 mg/dL (ref 1.7–2.4)

## 2021-05-08 LAB — PHOSPHORUS: Phosphorus: 3 mg/dL (ref 2.5–4.6)

## 2021-05-08 MED ORDER — INSULIN ASPART 100 UNIT/ML IJ SOLN
0.0000 [IU] | Freq: Three times a day (TID) | INTRAMUSCULAR | Status: DC
  Administered 2021-05-08: 3 [IU] via SUBCUTANEOUS
  Administered 2021-05-08: 4 [IU] via SUBCUTANEOUS
  Administered 2021-05-09: 2 [IU] via SUBCUTANEOUS

## 2021-05-08 NOTE — Progress Notes (Signed)
Occupational Therapy Treatment Patient Details Name: [name] [name] MRN: [id number] DOB: 1960-05-24 Today's Date: 05/08/2021   History of present illness 61 y/o female found by her son 2/14 incontinent of urine and altered. CT head negative, hypernatremia, fever; encephalopathy, septic shock due to empyema. 2/15 thoracentesis. PMH: DM on Metformin, RA with chronic pain, HTN, HL, CAD s/p CABG, UTI, CVA, recent 30 lb weight loss and functional decline.   OT comments  Pt making incremental progress with OT goals this session. Pt limited overall by dizziness once sitting EOB, requiring min guard to min A to maintain midline. Pt unaware of BM incontinence x2 during the session. Continues to require total A for LB bathing and toileting due to weakness, balance deficits, and cognition. Pt presents with increased recall this session, however requiring multimodal cuing throughout. Continue to recommend SNF and OT will follow acutely.    Recommendations for follow up therapy are one component of a multi-disciplinary discharge planning process, led by the attending physician.  Recommendations may be updated based on patient status, additional functional criteria and insurance authorization.    Follow Up Recommendations  Skilled nursing-short term rehab (<3 hours/day)    Assistance Recommended at Discharge Frequent or constant Supervision/Assistance  Patient can return home with the following  Two people to help with walking and/or transfers;Two people to help with bathing/dressing/bathroom;Assistance with cooking/housework;Assistance with feeding;Direct supervision/assist for medications management;Direct supervision/assist for financial management;Assist for transportation;Help with stairs or ramp for entrance   Equipment Recommendations  Tub/shower seat;Wheelchair (measurements OT);Wheelchair cushion (measurements OT)    Recommendations for Other Services      Precautions / Restrictions  Precautions Precautions: Fall;Other (comment) Restrictions Weight Bearing Restrictions: No       Mobility Bed Mobility Overal bed mobility: Needs Assistance Bed Mobility: Rolling, Sidelying to Sit, Sit to Sidelying Rolling: Min assist Sidelying to sit: Mod assist, HOB elevated     Sit to sidelying: Max assist General bed mobility comments: good technique for rolling with increased time and good use of bed rails, needing min assist to maintain roll during bathing; pt needing heavy trunk assist to push upright to sit on L EOB and for steadying trunk once upright, c/o dizziness; maxA for BLE to return to sidelying from EOB and cuing to lay on her L side    Transfers Overall transfer level: Needs assistance   Transfers: Bed to chair/wheelchair/BSC            Lateral/Scoot Transfers: Max assist, +2 safety/equipment General transfer comment: Deferred standing due to dizziness and requiring BM clean up x2, needing assist with placing hands and encouraging participation in scooting to her L side     Balance Overall balance assessment: Needs assistance Sitting-balance support: Bilateral upper extremity supported, Feet supported Sitting balance-Leahy Scale: Poor Sitting balance - Comments: intermittent anterior/posterior leans, pt c/o vertigo sx throughout, variable min guard to minA for trunk support Postural control: Posterior lean, Left lateral lean                                 ADL either performed or assessed with clinical judgement   ADL                                              Extremity/Trunk Assessment  Vision   Vision Assessment?: Vision impaired- to be further tested in functional context Additional Comments: Pt reporting increased dizziness looking to the L side, very unstable in sitting due to increased dizziness   Perception     Praxis      Cognition Arousal/Alertness: Awake/alert Behavior During  Therapy: WFL for tasks assessed/performed Overall Cognitive Status: Impaired/Different from baseline Area of Impairment: Orientation, Attention, Safety/judgement, Problem solving                 Orientation Level: Disoriented to, Time, Situation Current Attention Level: Sustained   Following Commands: Follows one step commands with increased time Safety/Judgement: Decreased awareness of deficits, Decreased awareness of safety   Problem Solving: Slow processing, Decreased initiation, Difficulty sequencing, Requires verbal cues General Comments: Pt requiring increased time and multimodal cues for commands, easily distracted and off topic, not fully aware of hospital stay/situation, however increased cognition over past few days.        Exercises      Shoulder Instructions       General Comments VSS on RA, BP 120/75 with complaints of dizziness in sitting    Pertinent Vitals/ Pain       Pain Assessment Pain Assessment: No/denies pain  Home Living                                          Prior Functioning/Environment              Frequency  Min 2X/week        Progress Toward Goals  OT Goals(current goals can now be found in the care plan section)  Progress towards OT goals: Progressing toward goals  Acute Rehab OT Goals Patient Stated Goal: To be independent OT Goal Formulation: With patient Time For Goal Achievement: 05/19/21 Potential to Achieve Goals: Good ADL Goals Pt Will Perform Grooming: with set-up;sitting Pt Will Perform Upper Body Bathing: with min guard assist;sitting Pt Will Perform Lower Body Bathing: with min assist;sitting/lateral leans;sit to/from stand Pt Will Perform Upper Body Dressing: with min guard assist;sitting Pt Will Perform Lower Body Dressing: with mod assist;sitting/lateral leans;sit to/from stand Pt Will Transfer to Toilet: with mod assist;with +2 assist;stand pivot transfer Pt Will Perform Toileting -  Clothing Manipulation and hygiene: with min guard assist;sitting/lateral leans;sit to/from stand  Plan Discharge plan remains appropriate;Frequency remains appropriate    Co-evaluation    PT/OT/SLP Co-Evaluation/Treatment: Yes Reason for Co-Treatment: Complexity of the patient's impairments (multi-system involvement);For patient/therapist safety;To address functional/ADL transfers   OT goals addressed during session: ADL's and self-care      AM-PAC OT "6 Clicks" Daily Activity     Outcome Measure   Help from another person eating meals?: A Little Help from another person taking care of personal grooming?: A Little Help from another person toileting, which includes using toliet, bedpan, or urinal?: Total Help from another person bathing (including washing, rinsing, drying)?: A Lot Help from another person to put on and taking off regular upper body clothing?: A Lot Help from another person to put on and taking off regular lower body clothing?: Total 6 Click Score: 12    End of Session    OT Visit Diagnosis: Unsteadiness on feet (R26.81);Other abnormalities of gait and mobility (R26.89);Muscle weakness (generalized) (M62.81)   Activity Tolerance Patient tolerated treatment well   Patient Left in bed;with call bell/phone within reach;with bed alarm set  Nurse Communication Mobility status        Time: 4782-9562 OT Time Calculation (min): 35 min  Charges: OT General Charges $OT Visit: 1 Visit OT Treatments $Self Care/Home Management : 8-22 mins  Henna Derderian H., OTR/L Acute Rehabilitation  Talisha Erby Elane Yolanda Bonine 05/08/2021, 4:17 PM

## 2021-05-08 NOTE — Progress Notes (Signed)
Physical Therapy Treatment Patient Details Name: [name] [name] MRN: [id number] DOB: January 28, 1961 Today's Date: 05/08/2021   History of Present Illness 61 y/o female found by her son 2/14 incontinent of urine and altered. CT head negative, hypernatremia, fever; encephalopathy, septic shock due to empyema. 2/15 thoracentesis. PMH: DM on Metformin, RA with chronic pain, HTN, HL, CAD s/p CABG, UTI, CVA, recent 30 lb weight loss and functional decline.    PT Comments    Patient continues to be limited by dizziness sitting EOB and with head turns to the L with mild nystagmus noted. Patient required min guard-minA to maintain sitting balance with B UE support. Patient with bowel incontinence x 2 requiring rolling to perform pericare. Will provide HEP at next session for son but at this time, patient unable to perform without assistance. Continue to recommend SNF for ongoing Physical Therapy.       Recommendations for follow up therapy are one component of a multi-disciplinary discharge planning process, led by the attending physician.  Recommendations may be updated based on patient status, additional functional criteria and insurance authorization.  Follow Up Recommendations  Skilled nursing-short term rehab (<3 hours/day)     Assistance Recommended at Discharge Frequent or constant Supervision/Assistance  Patient can return home with the following Two people to help with walking and/or transfers;Two people to help with bathing/dressing/bathroom;Direct supervision/assist for medications management;Assistance with cooking/housework;Assistance with feeding;Direct supervision/assist for financial management;Help with stairs or ramp for entrance   Equipment Recommendations  None recommended by PT    Recommendations for Other Services       Precautions / Restrictions Precautions Precautions: Fall Restrictions Weight Bearing Restrictions: No     Mobility  Bed Mobility Overal bed mobility:  Needs Assistance Bed Mobility: Rolling, Sidelying to Sit, Sit to Sidelying Rolling: Min assist Sidelying to sit: Mod assist, HOB elevated     Sit to sidelying: Max assist General bed mobility comments: good technique for rolling with increased time and good use of bed rails, needing min assist to maintain roll during bathing; pt needing heavy trunk assist to push upright to sit on L EOB and for steadying trunk once upright, c/o dizziness; maxA for BLE to return to sidelying from EOB and cuing to lay on her L side    Transfers Overall transfer level: Needs assistance   Transfers: Bed to chair/wheelchair/BSC            Lateral/Scoot Transfers: Max assist, +2 safety/equipment General transfer comment: Deferred standing due to dizziness and requiring BM clean up x2, needing assist with placing hands and encouraging participation in scooting to her L side    Ambulation/Gait                   Stairs             Wheelchair Mobility    Modified Rankin (Stroke Patients Only)       Balance Overall balance assessment: Needs assistance Sitting-balance support: Bilateral upper extremity supported, Feet supported Sitting balance-Leahy Scale: Poor Sitting balance - Comments: intermittent anterior/posterior leans, pt c/o vertigo sx throughout, variable min guard to minA for trunk support Postural control: Posterior lean, Left lateral lean                                  Cognition Arousal/Alertness: Awake/alert Behavior During Therapy: WFL for tasks assessed/performed Overall Cognitive Status: Impaired/Different from baseline Area of Impairment: Orientation, Attention, Safety/judgement, Problem  solving                 Orientation Level: Disoriented to, Time, Situation Current Attention Level: Sustained   Following Commands: Follows one step commands with increased time Safety/Judgement: Decreased awareness of deficits, Decreased awareness of  safety Awareness: Intellectual Problem Solving: Slow processing, Decreased initiation, Difficulty sequencing, Requires verbal cues General Comments: Pt requiring increased time and multimodal cues for commands, easily distracted and off topic, not fully aware of hospital stay/situation, however increased cognition over past few days.        Exercises      General Comments General comments (skin integrity, edema, etc.): VSS on RA, BP 120/75 with complaints of dizziness in sitting      Pertinent Vitals/Pain Pain Assessment Pain Assessment: No/denies pain    Home Living                          Prior Function            PT Goals (current goals can now be found in the care plan section) Acute Rehab PT Goals PT Goal Formulation: With patient Time For Goal Achievement: 05/18/21 Potential to Achieve Goals: Fair Progress towards PT goals: Progressing toward goals    Frequency    Min 3X/week      PT Plan Current plan remains appropriate    Co-evaluation PT/OT/SLP Co-Evaluation/Treatment: Yes Reason for Co-Treatment: Complexity of the patient's impairments (multi-system involvement);For patient/therapist safety;To address functional/ADL transfers PT goals addressed during session: Mobility/safety with mobility;Balance OT goals addressed during session: ADL's and self-care      AM-PAC PT "6 Clicks" Mobility   Outcome Measure  Help needed turning from your back to your side while in a flat bed without using bedrails?: A Little Help needed moving from lying on your back to sitting on the side of a flat bed without using bedrails?: A Lot Help needed moving to and from a bed to a chair (including a wheelchair)?: Total Help needed standing up from a chair using your arms (e.g., wheelchair or bedside chair)?: Total Help needed to walk in hospital room?: Total Help needed climbing 3-5 steps with a railing? : Total 6 Click Score: 9    End of Session Equipment  Utilized During Treatment: Gait belt Activity Tolerance: Patient tolerated treatment well Patient left: in bed;with call bell/phone within reach;with bed alarm set Nurse Communication: Mobility status PT Visit Diagnosis: Muscle weakness (generalized) (M62.81);History of falling (Z91.81);Difficulty in walking, not elsewhere classified (R26.2)     Time: 8144-8185 PT Time Calculation (min) (ACUTE ONLY): 35 min  Charges:  $Therapeutic Activity: 8-22 mins                     Derrick Orris A. Gilford Rile PT, DPT Acute Rehabilitation Services Pager [phone] Office [phone]    Linna Hoff 05/08/2021, 4:51 PM

## 2021-05-08 NOTE — Progress Notes (Signed)
PROGRESS NOTE        PATIENT DETAILS Name: [name] [name] Age: 61 y.o. Sex: female Date of Birth: 12/14/1960 Admit Date: 04/30/2021 Admitting Physician Collier Bullock, MD PIR:JJOACZYSAYT, Ronie Spies, MD  Brief Summary: Patient is a 61 y.o.  female with history of psoriatic arthritis, DM-2, HTN, CAD s/p CABG-presented to the hospital on 2/14 with confusion, hypotension, fever-she was found to have septic shock due to empyema-monitor closely in the [place] thoracocentesis-improved-and subsequently transferred to the [facility] service.  See below for further details.  Significant Hospital events: 2/14>> admit to ICU-confused/hypotensive/febrile-severe sepsis/septic shock-from empyema.  Numerous electrolyte abnormalities. 2/15>> thoracocentesis 2/17>> transfer to [facility] 2/21>> Foley catheter discontinued-voiding spontaneously.  Significant imaging studies: 2/14>> CT head: No acute abnormality 2/14>> CXR: Small volume right pleural effusion 2/15>> renal ultrasound: No hydronephrosis 2/15>> Echo: EF 01%, grade 1 diastolic dysfunction 6/01>> CXR: Mild right basilar atelectasis/infiltrate with small right pleural effusion.  Significant microbiology data: 2/14>> COVID/influenza PCR: Negative 2/14>> blood culture: Negative 2/14>> urine culture: Negative 2/15>> pleural fluid culture: No growth 2/15>> pleural fluid AFB smear: Negative 2/15>> pleural fluid AFB culture: Pending  Procedures: 2/15>> thoracocentesis  Consults: PCCM  Subjective: Claims she does not have much of an appetite-Per nursing staff eating some of her meals but drinking boost and other nutritional supplements.  Foley removed yesterday-voiding spontaneously.  Objective: Vitals: Blood pressure 120/75, pulse (!) 102, temperature 98.9 F (37.2 C), temperature source Oral, resp. rate 19, height 5\' 6"  (1.676 m), weight 75.8 kg, SpO2 98 %.   Exam: Gen Exam: Frail/chronically sick appearing-not  in any distress. HEENT:atraumatic, normocephalic Chest: B/L clear to auscultation anteriorly CVS:S1S2 regular Abdomen:soft non tender, non distended Extremities:no edema Neurology: Non focal Skin: no rash   Pertinent Labs/Radiology: CBC Latest Ref Rng & Units 05/07/2021 05/05/2021 05/04/2021  WBC 4.0 - 10.5 K/uL 4.4 7.1 4.5  Hemoglobin 12.0 - 15.0 g/dL 11.7(L) 11.4(L) 11.4(L)  Hematocrit 36.0 - 46.0 % 36.2 36.6 35.7(L)  Platelets 150 - 400 K/uL 127(L) 80(L) 75(L)    Lab Results  Component Value Date   NA 143 05/08/2021   K 4.2 05/08/2021   CL 109 05/08/2021   CO2 26 05/08/2021    Assessment/Plan: Septic shock due to empyema-s/p thoracocentesis on 2/15: Sepsis physiology has resolved-pleural fluid cultures remain negative so far-repeat CXR on 2/17 without any accumulation.  Unclear to me whether this was actually an empyema or a parapneumonic effusion.  PCCM has signed off-initially on Zosyn-discussed with ID-Dr. Juleen China over the phone on 2/22-recommends 3 additional weeks of Augmentin.  Acute metabolic encephalopathy: Multifactorial etiology due to sepsis/AKI and hypernatremia.  CT head negative.  Although slow-significantly improved over the past few days.  AKI: Likely hemodynamically mediated-resolved with supportive care.  Hypokalemia/hypokalemia/hypophosphatemia: Repleted-recheck periodically  Hyponatremia: Resolved.  HTN: Continue amlodipine  NIDDM-2 (A1c 6.7 on 2/14): CBG stable with SSI - Resume oral hypoglycemic agents on discharge.  Recent Labs    05/08/21 0423 05/08/21 0728 05/08/21 1144  GLUCAP 164* 136* 22*      CAD s/p CABG in 2017 and PCI/DES to RCA 2017:Troponin minimally elevated in the setting of demand ischemia/sepsis, ACS ruled out  Combined chronic systolic/diastolic heart failure: Resume fluid restrictions/diuretics once volume status improves.  Acute urinary retention: Secondary to acute illness debility/deconditioning-Foley catheter discontinued  on 2/21-voiding spontaneously.  Thrombocytopenia: Likely due to sepsis-improving.  GERD: Continue PPI  History  of psoriatic arthritis: Follows with rheumatology in the outpatient setting.  Debility/deconditioning/functional quadriplegia: Due to acute illness-SNF planned on discharge  Nutrition Status: Nutrition Problem: Moderate Malnutrition Etiology: chronic illness, dysphagia Signs/Symptoms: moderate muscle depletion, moderate fat depletion, percent weight loss (15% weight loss within 6 months) Percent weight loss: 15 % Interventions: MVI, Ensure Enlive (each supplement provides 350kcal and 20 grams of protein)  BMI: Estimated body mass index is 26.97 kg/m as calculated from the following:   Height as of this encounter: 5\' 6"  (1.676 m).   Weight as of this encounter: 75.8 kg.   Code status:   Code Status: Full Code   DVT Prophylaxis: SCDs Start: 05/01/21 0019   Family Communication: Son [name]-[phone] updated 2/22   Disposition Plan: Status is: Inpatient Remains inpatient appropriate because: Resolving sepsis physiology/resolving encephalopathy-on IV antibiotics-needs several more days of hospitalization to ensure stability and appropriate discharge disposition.  Probably SNF on discharge.    Planned Discharge Destination:Skilled nursing facility   Diet: Diet Order             DIET DYS 3 Room service appropriate? Yes; Fluid consistency: Thin  Diet effective now                     Antimicrobial agents: Anti-infectives (From admission, onward)    Start     Dose/Rate Route Frequency Ordered Stop   05/07/21 1430  amoxicillin-clavulanate (AUGMENTIN) 875-125 MG per tablet 1 tablet        1 tablet Oral Every 12 hours 05/07/21 1341     05/02/21 1800  piperacillin-tazobactam (ZOSYN) IVPB 3.375 g  Status:  Discontinued        3.375 g 12.5 mL/hr over 240 Minutes Intravenous Every 8 hours 05/02/21 1220 05/07/21 1341   05/01/21 2200  ceFEPIme (MAXIPIME) 2 g  in sodium chloride 0.9 % 100 mL IVPB  Status:  Discontinued        2 g 200 mL/hr over 30 Minutes Intravenous Every 12 hours 05/01/21 0735 05/01/21 1722   05/01/21 2100  vancomycin (VANCOREADY) IVPB 1500 mg/300 mL  Status:  Discontinued        1,500 mg 150 mL/hr over 120 Minutes Intravenous Every 24 hours 04/30/21 1956 05/01/21 1009   05/01/21 1800  piperacillin-tazobactam (ZOSYN) IVPB 3.375 g  Status:  Discontinued        3.375 g 12.5 mL/hr over 240 Minutes Intravenous Every 8 hours 05/01/21 1722 05/02/21 1220   04/30/21 2000  ceFEPIme (MAXIPIME) 2 g in sodium chloride 0.9 % 100 mL IVPB  Status:  Discontinued        2 g 200 mL/hr over 30 Minutes Intravenous Every 8 hours 04/30/21 1923 05/01/21 0735   04/30/21 1915  vancomycin (VANCOREADY) IVPB 1750 mg/350 mL        1,750 mg 175 mL/hr over 120 Minutes Intravenous  Once 04/30/21 1905 04/30/21 2228   04/30/21 1900  metroNIDAZOLE (FLAGYL) IVPB 500 mg        500 mg 100 mL/hr over 60 Minutes Intravenous  Once 04/30/21 1845 04/30/21 2057        MEDICATIONS: Scheduled Meds:  amLODipine  5 mg Per Tube Daily   amoxicillin-clavulanate  1 tablet Oral Q12H   aspirin  81 mg Per Tube Daily   atorvastatin  40 mg Per Tube Daily   B-complex with vitamin C  1 tablet Per Tube Daily   bethanechol  10 mg Per Tube TID   chlorhexidine  15 mL Mouth Rinse BID  feeding supplement  237 mL Oral BID BM   insulin aspart  0-6 Units Subcutaneous TID WC   mouth rinse  15 mL Mouth Rinse q12n4p   pantoprazole sodium  40 mg Per Tube Daily   Continuous Infusions:   PRN Meds:.lip balm, loperamide, ondansetron (ZOFRAN) IV, polyethylene glycol, polyvinyl alcohol   I have personally reviewed following labs and imaging studies  LABORATORY DATA: CBC: Recent Labs  Lab 05/02/21 0248 05/03/21 0245 05/04/21 0323 05/05/21 0142 05/07/21 0118  WBC 6.9 5.0 4.5 7.1 4.4  NEUTROABS 5.8 3.3  --   --   --   HGB 12.2 12.7 11.4* 11.4* 11.7*  HCT 39.3 40.5 35.7* 36.6  36.2  MCV 94.2 91.6 90.2 91.7 90.0  PLT 91* 77* 75* 80* 127*     Basic Metabolic Panel: Recent Labs  Lab 05/02/21 0248 05/02/21 1916 05/03/21 0245 05/04/21 0323 05/05/21 0142 05/05/21 0741 05/07/21 0118 05/08/21 0316  NA 148*  --  142 140 138  --  141 143  K 3.3*  --  3.0* 3.4* 3.3*  --  3.4* 4.2  CL 113*  --  107 106 104  --  104 109  CO2 27  --  28 25 26   --  29 26  GLUCOSE 173*  --  204* 268* 202*  --  82 65*  BUN 12  --  13 8 5*  --  <5* 5*  CREATININE 0.86  --  0.60 0.56 0.45  --  0.54 0.60  CALCIUM 8.4*  --  7.7* 7.5* 7.8*  --  8.8* 8.8*  MG 2.5*  --  1.8 2.0 1.7  --   --  2.1  PHOS 1.4*   < > 1.5* 1.1* <1.0* 2.0* 3.2 3.0   < > = values in this interval not displayed.     GFR: Estimated Creatinine Clearance: 75.9 mL/min (by C-G formula based on SCr of 0.6 mg/dL).  Liver Function Tests: Recent Labs  Lab 05/01/21 1633 05/04/21 0323  AST  --  102*  ALT  --  57*  ALKPHOS  --  75  BILITOT  --  0.9  PROT 6.7 5.0*  ALBUMIN  --  1.9*    No results for input(s): LIPASE, AMYLASE in the last 168 hours. No results for input(s): AMMONIA in the last 168 hours.   Coagulation Profile: No results for input(s): INR, PROTIME in the last 168 hours.   Cardiac Enzymes: No results for input(s): CKTOTAL, CKMB, CKMBINDEX, TROPONINI in the last 168 hours.  BNP (last 3 results) No results for input(s): PROBNP in the last 8760 hours.  Lipid Profile: No results for input(s): CHOL, HDL, LDLCALC, TRIG, CHOLHDL, LDLDIRECT in the last 72 hours.  Thyroid Function Tests: No results for input(s): TSH, T4TOTAL, FREET4, T3FREE, THYROIDAB in the last 72 hours.   Anemia Panel: No results for input(s): VITAMINB12, FOLATE, FERRITIN, TIBC, IRON, RETICCTPCT in the last 72 hours.   Urine analysis:    Component Value Date/Time   COLORURINE AMBER (A) 04/30/2021 1926   APPEARANCEUR HAZY (A) 04/30/2021 1926   LABSPEC 1.021 04/30/2021 1926   PHURINE 6.0 04/30/2021 1926   GLUCOSEU  NEGATIVE 04/30/2021 1926   HGBUR SMALL (A) 04/30/2021 1926   BILIRUBINUR NEGATIVE 04/30/2021 1926   KETONESUR 20 (A) 04/30/2021 1926   PROTEINUR 100 (A) 04/30/2021 1926   UROBILINOGEN 1.0 06/17/2012 1909   NITRITE NEGATIVE 04/30/2021 1926   LEUKOCYTESUR TRACE (A) 04/30/2021 1926    Sepsis Labs: Lactic Acid, Venous  Component Value Date/Time   LATICACIDVEN 5.9 (HH) 05/01/2021 1211    MICROBIOLOGY: Recent Results (from the past 240 hour(s))  Resp Panel by RT-PCR (Flu A&B, Covid) Urine, In & Out Cath     Status: None   Collection Time: 04/30/21  6:36 PM   Specimen: Urine, In & Out Cath; Nasopharyngeal(NP) swabs in vial transport medium  Result Value Ref Range Status   SARS Coronavirus 2 by RT PCR NEGATIVE NEGATIVE Final    Comment: (NOTE) SARS-CoV-2 target nucleic acids are NOT DETECTED.  The SARS-CoV-2 RNA is generally detectable in upper respiratory specimens during the acute phase of infection. The lowest concentration of SARS-CoV-2 viral copies this assay can detect is 138 copies/mL. A negative result does not preclude SARS-Cov-2 infection and should not be used as the sole basis for treatment or other patient management decisions. A negative result may occur with  improper specimen collection/handling, submission of specimen other than nasopharyngeal swab, presence of viral mutation(s) within the areas targeted by this assay, and inadequate number of viral copies(<138 copies/mL). A negative result must be combined with clinical observations, patient history, and epidemiological information. The expected result is Negative.  Fact Sheet for Patients:  EntrepreneurPulse.com.au  Fact Sheet for Healthcare Providers:  IncredibleEmployment.be  This test is no t yet approved or cleared by the [place] FDA and  has been authorized for detection and/or diagnosis of SARS-CoV-2 by FDA under an Emergency Use Authorization (EUA). This EUA  will remain  in effect (meaning this test can be used) for the duration of the COVID-19 declaration under Section 564(b)(1) of the Act, 21 U.S.C.section 360bbb-3(b)(1), unless the authorization is terminated  or revoked sooner.       Influenza A by PCR NEGATIVE NEGATIVE Final   Influenza B by PCR NEGATIVE NEGATIVE Final    Comment: (NOTE) The Xpert Xpress SARS-CoV-2/FLU/RSV plus assay is intended as an aid in the diagnosis of influenza from Nasopharyngeal swab specimens and should not be used as a sole basis for treatment. Nasal washings and aspirates are unacceptable for Xpert Xpress SARS-CoV-2/FLU/RSV testing.  Fact Sheet for Patients: EntrepreneurPulse.com.au  Fact Sheet for Healthcare Providers: IncredibleEmployment.be  This test is not yet approved or cleared by the [place] FDA and has been authorized for detection and/or diagnosis of SARS-CoV-2 by FDA under an Emergency Use Authorization (EUA). This EUA will remain in effect (meaning this test can be used) for the duration of the COVID-19 declaration under Section 564(b)(1) of the Act, 21 U.S.C. section 360bbb-3(b)(1), unless the authorization is terminated or revoked.  Performed at [facility], [place] [address], [place], [place] [address]   Urine Culture     Status: None   Collection Time: 04/30/21  6:44 PM   Specimen: In/Out Cath Urine  Result Value Ref Range Status   Specimen Description IN/OUT CATH URINE  Final   Special Requests NONE  Final   Culture   Final    NO GROWTH Performed at [facility], [place] [address], [place],  [address]    Report Status 05/02/2021 FINAL  Final  Culture, blood (Routine x 2)     Status: None   Collection Time: 04/30/21  6:55 PM   Specimen: BLOOD  Result Value Ref Range Status   Specimen Description BLOOD LEFT ANTECUBITAL  Final   Special Requests   Final    BOTTLES DRAWN AEROBIC AND ANAEROBIC Blood Culture adequate  volume   Culture   Final    NO  GROWTH 5 DAYS Performed at [facility], [place] [address] [place], [place] [address]    Report Status 05/05/2021 FINAL  Final  MRSA Next Gen by PCR, Nasal     Status: None   Collection Time: 05/01/21  4:14 AM   Specimen: Nasal Mucosa; Nasal Swab  Result Value Ref Range Status   MRSA by PCR Next Gen NOT DETECTED NOT DETECTED Final    Comment: (NOTE) The GeneXpert MRSA Assay (FDA approved for NASAL specimens only), is one component of a comprehensive MRSA colonization surveillance program. It is not intended to diagnose MRSA infection nor to guide or monitor treatment for MRSA infections. Test performance is not FDA approved in patients less than 55 years old. Performed at [facility], [place] [address], [place], [place] [address]   Acid Fast Smear (AFB)     Status: None   Collection Time: 05/01/21  4:13 PM   Specimen: Pleural  Result Value Ref Range Status   AFB Specimen Processing Concentration  Final   Acid Fast Smear Negative  Final    Comment: (NOTE) Performed At: [facility] [place], [place] [id number] Rush Farmer MD AY:[id number]    Source (AFB) FLUID  Final    Comment: Performed at [facility], [place] [address] [place], [place] [address]  Body fluid culture w Gram Stain     Status: None   Collection Time: 05/01/21  4:21 PM   Specimen: Pleural Fluid  Result Value Ref Range Status   Specimen Description PLEURAL  Final   Special Requests NONE  Final   Gram Stain   Final    RARE WBC PRESENT, PREDOMINANTLY MONONUCLEAR NO ORGANISMS SEEN    Culture   Final    NO GROWTH 3 DAYS Performed at [facility], [place] [address] [place], [place] [address]    Report Status 05/05/2021 FINAL  Final    RADIOLOGY STUDIES/RESULTS: No results found.   LOS: 7 days   Oren Binet, DO  [facility]    To contact the attending provider between 7A-7P or the covering provider during after hours  7P-7A, please log into the web site [email or web address] and access using universal [place] password for that web site. If you do not have the password, please call the hospital operator.  05/08/2021, 1:40 PM

## 2021-05-08 NOTE — Progress Notes (Signed)
Speech Language Pathology Treatment: Dysphagia  Patient Details Name: [name] [name] MRN: [id number] DOB: 15-Mar-1961 Today's Date: 05/08/2021 Time: [id number] SLP Time Calculation (min) (ACUTE ONLY): 20 min  Assessment / Plan / Recommendation Clinical Impression  Patient seen by SLP for skilled treatment session focused on dysphagia goals. Patient feeding self Dys 3 solids, thin liquids breakfast meal tray. Patient able to adequately feed self but continues with overall confusion and decreased attention, leading to trace to mild solid texture PO's remaining in oral cavity post initial swallows. Residuals clear with swallows of liquids without difficulty. No immediate coughing or throat clearing but patient did exhibit some very delayed dry coughing and throat clearing. Patient then stating she wanted to sleep and SLP directed her to take sips of liquids to clear out PO residuals in mouth. Before reclining bed slightly, SLP inspected patient's oral cavity and observed only very trace amount of PO residuals which then fully cleared with a couple more liquid sips. SLP continues to recommend Dys 3 solids, thin liquids diet with patient and for intermittent supervision with PO intake. Patient continues to be easily distracted and even presence of SLP in room quietly observing her is distracting, leading to her talking with food in mouth. SLP will continue to follow patient for diet toleration but anticipate she will not be ready for upgrade to regular textures prior to discharge to SNF.   HPI HPI: Patient is a 61 y.o. female with PMH: DM, RA with chronic pain, HTN, HL, CAD s/p CABG. She was found on 2/14 by her son incontinent of urine and with AMS. EMS was called and patient initially intermittently responsive to pain and voice, initially hypotensive. Son reported to medical staff that patient was exhibiting a gradual worsening over last couple months with weight loss (30 lb), aching pain all over and  swallowing issues (sensation of food getting caught in throat). She fell last week and was seen in ED on 2/9 and discharged home. Son also reported unsure if patient taking her medications correctly. In ED, BP improved, CT head negative, initially febrile at 100.9. CXR revealed  small volume right pleural effusion. Paitent was made NPO and speech swallow evaluation ordered.      SLP Plan  Continue with current plan of care      Recommendations for follow up therapy are one component of a multi-disciplinary discharge planning process, led by the attending physician.  Recommendations may be updated based on patient status, additional functional criteria and insurance authorization.    Recommendations  Diet recommendations: Dysphagia 3 (mechanical soft);Thin liquid Liquids provided via: Cup;Straw Medication Administration: Whole meds with liquid Supervision: Patient able to self feed;Intermittent supervision to cue for compensatory strategies Compensations: Slow rate;Small sips/bites;Minimize environmental distractions Postural Changes and/or Swallow Maneuvers: Seated upright 90 degrees                Oral Care Recommendations: Oral care BID Follow Up Recommendations: Skilled nursing-short term rehab (<3 hours/day) Assistance recommended at discharge: Frequent or constant Supervision/Assistance SLP Visit Diagnosis: Dysphagia, unspecified (R13.10) Plan: Continue with current plan of care          Sonia Baller, MA, CCC-SLP Speech Therapy

## 2021-05-08 NOTE — TOC Progression Note (Signed)
Transition of Care [facility]) - Progression Note    Patient Details  Name: KATRECE ROEDIGER MRN: [id number] Date of Birth: Mar 11, 1961  Transition of Care [facility]) CM/SW [place], LCSW Phone Number: 05/08/2021, 2:26 PM  Clinical Narrative:    CSW updated patient's son, [name], that Helene Kelp has started insurance approval process and request that family complete paperwork tomorrow at 10:30am. [name] reported agreement with plan.    Expected Discharge Plan: Skilled Nursing Facility Barriers to Discharge: Insurance Authorization  Expected Discharge Plan and Services Expected Discharge Plan: [place] In-house Referral: Clinical Social Work   Post Acute Care Choice: [place] Living arrangements for the past 2 months: Apartment                                       Social Determinants of Health (SDOH) Interventions    Readmission Risk Interventions No flowsheet data found.

## 2021-05-09 DIAGNOSIS — E876 Hypokalemia: Secondary | ICD-10-CM | POA: Diagnosis not present

## 2021-05-09 DIAGNOSIS — R1310 Dysphagia, unspecified: Secondary | ICD-10-CM | POA: Diagnosis not present

## 2021-05-09 DIAGNOSIS — I25708 Atherosclerosis of coronary artery bypass graft(s), unspecified, with other forms of angina pectoris: Secondary | ICD-10-CM | POA: Diagnosis not present

## 2021-05-09 DIAGNOSIS — A419 Sepsis, unspecified organism: Secondary | ICD-10-CM | POA: Diagnosis not present

## 2021-05-09 DIAGNOSIS — M6281 Muscle weakness (generalized): Secondary | ICD-10-CM | POA: Diagnosis not present

## 2021-05-09 DIAGNOSIS — F339 Major depressive disorder, recurrent, unspecified: Secondary | ICD-10-CM | POA: Diagnosis not present

## 2021-05-09 DIAGNOSIS — J869 Pyothorax without fistula: Secondary | ICD-10-CM | POA: Diagnosis not present

## 2021-05-09 DIAGNOSIS — E785 Hyperlipidemia, unspecified: Secondary | ICD-10-CM | POA: Diagnosis not present

## 2021-05-09 DIAGNOSIS — M069 Rheumatoid arthritis, unspecified: Secondary | ICD-10-CM | POA: Diagnosis not present

## 2021-05-09 DIAGNOSIS — R6521 Severe sepsis with septic shock: Secondary | ICD-10-CM | POA: Diagnosis not present

## 2021-05-09 DIAGNOSIS — R509 Fever, unspecified: Secondary | ICD-10-CM | POA: Diagnosis not present

## 2021-05-09 DIAGNOSIS — I10 Essential (primary) hypertension: Secondary | ICD-10-CM | POA: Diagnosis not present

## 2021-05-09 DIAGNOSIS — Z23 Encounter for immunization: Secondary | ICD-10-CM | POA: Diagnosis not present

## 2021-05-09 DIAGNOSIS — R1312 Dysphagia, oropharyngeal phase: Secondary | ICD-10-CM | POA: Diagnosis not present

## 2021-05-09 DIAGNOSIS — E871 Hypo-osmolality and hyponatremia: Secondary | ICD-10-CM | POA: Diagnosis not present

## 2021-05-09 DIAGNOSIS — R410 Disorientation, unspecified: Secondary | ICD-10-CM | POA: Diagnosis not present

## 2021-05-09 DIAGNOSIS — Z794 Long term (current) use of insulin: Secondary | ICD-10-CM | POA: Diagnosis not present

## 2021-05-09 DIAGNOSIS — L4050 Arthropathic psoriasis, unspecified: Secondary | ICD-10-CM | POA: Diagnosis not present

## 2021-05-09 DIAGNOSIS — R000 Tachycardia, unspecified: Secondary | ICD-10-CM | POA: Diagnosis not present

## 2021-05-09 DIAGNOSIS — N179 Acute kidney failure, unspecified: Secondary | ICD-10-CM | POA: Diagnosis not present

## 2021-05-09 DIAGNOSIS — I25118 Atherosclerotic heart disease of native coronary artery with other forms of angina pectoris: Secondary | ICD-10-CM | POA: Diagnosis not present

## 2021-05-09 DIAGNOSIS — J90 Pleural effusion, not elsewhere classified: Secondary | ICD-10-CM | POA: Diagnosis not present

## 2021-05-09 DIAGNOSIS — E1165 Type 2 diabetes mellitus with hyperglycemia: Secondary | ICD-10-CM | POA: Diagnosis not present

## 2021-05-09 DIAGNOSIS — I255 Ischemic cardiomyopathy: Secondary | ICD-10-CM | POA: Diagnosis not present

## 2021-05-09 DIAGNOSIS — G629 Polyneuropathy, unspecified: Secondary | ICD-10-CM | POA: Diagnosis not present

## 2021-05-09 DIAGNOSIS — E119 Type 2 diabetes mellitus without complications: Secondary | ICD-10-CM | POA: Diagnosis not present

## 2021-05-09 DIAGNOSIS — E440 Moderate protein-calorie malnutrition: Secondary | ICD-10-CM | POA: Diagnosis not present

## 2021-05-09 DIAGNOSIS — E1140 Type 2 diabetes mellitus with diabetic neuropathy, unspecified: Secondary | ICD-10-CM | POA: Diagnosis not present

## 2021-05-09 DIAGNOSIS — I2510 Atherosclerotic heart disease of native coronary artery without angina pectoris: Secondary | ICD-10-CM | POA: Diagnosis not present

## 2021-05-09 DIAGNOSIS — M329 Systemic lupus erythematosus, unspecified: Secondary | ICD-10-CM | POA: Diagnosis not present

## 2021-05-09 DIAGNOSIS — I69391 Dysphagia following cerebral infarction: Secondary | ICD-10-CM | POA: Diagnosis not present

## 2021-05-09 DIAGNOSIS — Z87442 Personal history of urinary calculi: Secondary | ICD-10-CM | POA: Diagnosis not present

## 2021-05-09 DIAGNOSIS — R262 Difficulty in walking, not elsewhere classified: Secondary | ICD-10-CM | POA: Diagnosis not present

## 2021-05-09 DIAGNOSIS — G9340 Encephalopathy, unspecified: Secondary | ICD-10-CM | POA: Diagnosis not present

## 2021-05-09 DIAGNOSIS — E042 Nontoxic multinodular goiter: Secondary | ICD-10-CM | POA: Diagnosis not present

## 2021-05-09 DIAGNOSIS — Z741 Need for assistance with personal care: Secondary | ICD-10-CM | POA: Diagnosis not present

## 2021-05-09 DIAGNOSIS — Z7401 Bed confinement status: Secondary | ICD-10-CM | POA: Diagnosis not present

## 2021-05-09 DIAGNOSIS — I25810 Atherosclerosis of coronary artery bypass graft(s) without angina pectoris: Secondary | ICD-10-CM | POA: Diagnosis not present

## 2021-05-09 DIAGNOSIS — M6259 Muscle wasting and atrophy, not elsewhere classified, multiple sites: Secondary | ICD-10-CM | POA: Diagnosis not present

## 2021-05-09 DIAGNOSIS — R2681 Unsteadiness on feet: Secondary | ICD-10-CM | POA: Diagnosis not present

## 2021-05-09 LAB — CBC
HCT: 33.1 % — ABNORMAL LOW (ref 36.0–46.0)
Hemoglobin: 10.6 g/dL — ABNORMAL LOW (ref 12.0–15.0)
MCH: 29.4 pg (ref 26.0–34.0)
MCHC: 32 g/dL (ref 30.0–36.0)
MCV: 91.7 fL (ref 80.0–100.0)
Platelets: 241 10*3/uL (ref 150–400)
RBC: 3.61 MIL/uL — ABNORMAL LOW (ref 3.87–5.11)
RDW: 17 % — ABNORMAL HIGH (ref 11.5–15.5)
WBC: 8.4 10*3/uL (ref 4.0–10.5)
nRBC: 0 % (ref 0.0–0.2)

## 2021-05-09 LAB — RESP PANEL BY RT-PCR (FLU A&B, COVID) ARPGX2
Influenza A by PCR: NEGATIVE
Influenza B by PCR: NEGATIVE
SARS Coronavirus 2 by RT PCR: NEGATIVE

## 2021-05-09 LAB — GLUCOSE, CAPILLARY
Glucose-Capillary: 134 mg/dL — ABNORMAL HIGH (ref 70–99)
Glucose-Capillary: 231 mg/dL — ABNORMAL HIGH (ref 70–99)

## 2021-05-09 MED ORDER — INSULIN ASPART 100 UNIT/ML IJ SOLN
INTRAMUSCULAR | 11 refills | Status: DC
Start: 2021-05-09 — End: 2021-05-10

## 2021-05-09 MED ORDER — THIAMINE HCL 100 MG PO TABS
250.0000 mg | ORAL_TABLET | Freq: Every day | ORAL | Status: DC
  Administered 2021-05-09: 250 mg via ORAL
  Filled 2021-05-09: qty 3

## 2021-05-09 MED ORDER — AMOXICILLIN-POT CLAVULANATE 875-125 MG PO TABS: 1.0000 | ORAL_TABLET | Freq: Two times a day (BID) | ORAL | 0 refills | Status: AC

## 2021-05-09 MED ORDER — ENSURE ENLIVE PO LIQD: 237.0000 mL | Freq: Two times a day (BID) | ORAL | 12 refills | Status: DC

## 2021-05-09 MED ORDER — B COMPLEX-C PO TABS: 1.0000 | ORAL_TABLET | Freq: Every day | ORAL | Status: DC

## 2021-05-09 MED ORDER — PANTOPRAZOLE SODIUM 40 MG PO TBEC: 40.0000 mg | DELAYED_RELEASE_TABLET | Freq: Every day | ORAL | Status: DC

## 2021-05-09 MED ORDER — THIAMINE HCL 100 MG PO TABS: 250.0000 mg | ORAL_TABLET | Freq: Every day | ORAL | Status: DC

## 2021-05-09 NOTE — Progress Notes (Signed)
Inpatient Diabetes Program Recommendations  AACE/ADA: New Consensus Statement on Inpatient Glycemic Control (2015)  Target Ranges:  Prepandial:   less than 140 mg/dL      Peak postprandial:   less than 180 mg/dL (1-2 hours)      Critically ill patients:  140 - 180 mg/dL   Lab Results  Component Value Date   GLUCAP 134 (H) 05/09/2021   HGBA1C 6.7 (H) 04/30/2021    Review of Glycemic Control  Latest Reference Range & Units 05/08/21 07:28 05/08/21 11:44 05/08/21 15:11 05/08/21 20:28 05/09/21 08:04  Glucose-Capillary 70 - 99 mg/dL 136 (H) 238 (H) 317 (H) 289 (H) 134 (H)  (H): Data is abnormally high  Diabetes history: DM2 Outpatient Diabetes medications: Metformin 1000 mg BID & Victoza 1.8 QHS Current orders for Inpatient glycemic control: Novolog 0-6 units TID  Inpatient Diabetes Program Recommendations:    Postprandials elevated.  Please consider:  Novolog 3 units TID with meals if eats at least 50%.  Will continue to follow while inpatient.  Thank you, Reche Dixon, MSN, RN Diabetes Coordinator Inpatient Diabetes Program [phone] (team pager from 8a-5p)

## 2021-05-09 NOTE — Care Management Important Message (Signed)
Important Message  Patient Details  Name: [name] [name] MRN: [id number] Date of Birth: 28-Feb-1961   Medicare Important Message Given:  Yes     Orbie Pyo 05/09/2021, 3:35 PM

## 2021-05-09 NOTE — Discharge Summary (Addendum)
PATIENT DETAILS Name: [name] [name] Age: 61 y.o. Sex: female Date of Birth: 1960/08/31 MRN: [id number]. Admitting Physician: Collier Bullock, MD GBT:[id number], Ronie Spies, MD  Admit Date: 04/30/2021 Discharge date: 05/09/2021  Recommendations for Outpatient Follow-up:  Follow up with PCP in 1-2 weeks Please obtain CMP/CBC in one week Follow-pleural fluid AFB culture until final. Has known thyroid nodules on prior imaging-PCP to consider FNA and other studies.  Admitted From:  Home  Disposition: Skilled nursing facility   Discharge Condition: fair  CODE STATUS:   Code Status: Full Code   Diet recommendation:  Diet Order             Diet - low sodium heart healthy           Diet general           DIET DYS 3 Room service appropriate? Yes; Fluid consistency: Thin  Diet effective now                    Brief Summary: Patient is a 61 y.o.  female with history of psoriatic arthritis, DM-2, HTN, CAD s/p CABG-presented to the hospital on 2/14 with confusion, hypotension, fever-she was found to have septic shock due to empyema-monitor closely in the [facility] thoracocentesis-improved-and subsequently transferred to the [facility] service.  See below for further details.   Significant Hospital events: 2/14>> admit to ICU-confused/hypotensive/febrile-severe sepsis/septic shock-from empyema.  Numerous electrolyte abnormalities. 2/15>> thoracocentesis 2/17>> transfer to [facility] 2/21>> Foley catheter discontinued-voiding spontaneously.   Significant imaging studies: 2/14>> CT head: No acute abnormality 2/14>> CXR: Small volume right pleural effusion 2/15>> renal ultrasound: No hydronephrosis 2/15>> Echo: EF 06%, grade 1 diastolic dysfunction 2/69>> CXR: Mild right basilar atelectasis/infiltrate with small right pleural effusion.   Significant microbiology data: 2/14>> COVID/influenza PCR: Negative 2/14>> blood culture: Negative 2/14>> urine culture: Negative 2/15>>  pleural fluid culture: No growth 2/15>> pleural fluid AFB smear: Negative 2/15>> pleural fluid AFB culture: Pending   Procedures: 2/15>> thoracocentesis   Consults: [facility] Course: Septic shock due to empyema-s/p thoracocentesis on 2/15: Sepsis physiology has resolved-pleural fluid cultures remain negative so far-repeat CXR on 2/17 without any accumulation.  Unclear to me whether this was actually an empyema or a parapneumonic effusion.  PCCM has signed off-initially on Zosyn-discussed with ID-Dr. Juleen China over the phone on 2/22-recommends 3 additional weeks of Augmentin.   Acute metabolic encephalopathy: Multifactorial etiology due to sepsis/AKI and hypernatremia.  CT head negative.  Although slow-significantly improved over the past few days.   AKI: Likely hemodynamically mediated-resolved with supportive care.   Hypokalemia/hypokalemia/hypophosphatemia: Repleted-recheck periodically   Hyponatremia: Resolved.   HTN: Continue amlodipine   NIDDM-2 (A1c 6.7 on 2/14): CBG stable with SSI - Resume oral hypoglycemic agents on discharge.  CAD s/p CABG in 2017 and PCI/DES to RCA 2017:Troponin minimally elevated in the setting of demand ischemia/sepsis, ACS ruled out   Combined chronic systolic/diastolic heart failure: Resume fluid restrictions/diuretics once volume status improves.   Acute urinary retention: Secondary to acute illness debility/deconditioning-Foley catheter discontinued on 2/21-voiding spontaneously.   Thrombocytopenia: Likely due to sepsis-improving.   GERD: Continue PPI   History of psoriatic arthritis: Follows with rheumatology in the outpatient setting.  Thyroid nodules: Seen on CT soft tissue neck done on 2/1 (prior to this hospitalization) PCP to consider further work-up including FNA.  Thiamine deficiency: We will place on continued thiamine supplementation on discharge.   Debility/deconditioning/functional quadriplegia: Due to acute illness-SNF  planned on discharge   Nutrition Status: Nutrition Problem:  Moderate Malnutrition Etiology: chronic illness, dysphagia Signs/Symptoms: moderate muscle depletion, moderate fat depletion, percent weight loss (15% weight loss within 6 months) Percent weight loss: 15 % Interventions: MVI, Ensure Enlive (each supplement provides 350kcal and 20 grams of protein)   BMI: Estimated body mass index is 26.97 kg/m as calculated from the following:   Height as of this encounter: 5\' 6"  (1.676 m).   Weight as of this encounter: 75.8 kg.    Nutrition Status: Nutrition Problem: Moderate Malnutrition Etiology: chronic illness, dysphagia Signs/Symptoms: moderate muscle depletion, moderate fat depletion, percent weight loss (15% weight loss within 6 months) Percent weight loss: 15 % Interventions: MVI, Ensure Enlive (each supplement provides 350kcal and 20 grams of protein)    RN pressure injury documentation: Pressure Injury 05/01/21 Sacrum Mid Stage 1 -  Intact skin with non-blanchable redness of a localized area usually over a bony prominence. (Active)  05/01/21 0400  Location: Sacrum  Location Orientation: Mid  Staging: Stage 1 -  Intact skin with non-blanchable redness of a localized area usually over a bony prominence.  Wound Description (Comments):   Present on Admission: Yes     Pressure Injury 05/08/21 Buttocks Left;Right Stage 2 -  Partial thickness loss of dermis presenting as a shallow open injury with a red, pink wound bed without slough. (Active)  05/08/21 1951  Location: Buttocks  Location Orientation: Left;Right  Staging: Stage 2 -  Partial thickness loss of dermis presenting as a shallow open injury with a red, pink wound bed without slough.  Wound Description (Comments):   Present on Admission:     Discharge Diagnoses:  Principal Problem:   AMS (altered mental status) Active Problems:   Pressure injury of skin   Malnutrition of moderate degree   Discharge  Instructions:  Activity:  As tolerated with Full fall precautions use walker/cane & assistance as needed  Discharge Instructions     Diet - low sodium heart healthy   Complete by: As directed    Diet general   Complete by: As directed    Discharge instructions   Complete by: As directed    Follow with Primary MD  Leeroy Cha, MD in 1-2 weeks  Please get a complete blood count and chemistry panel checked by your Primary MD at your next visit, and again as instructed by your Primary MD.  Get Medicines reviewed and adjusted: Please take all your medications with you for your next visit with your Primary MD  Laboratory/radiological data: Please request your Primary MD to go over all hospital tests and procedure/radiological results at the follow up, please ask your Primary MD to get all Hospital records sent to his/her office.  In some cases, they will be blood work, cultures and biopsy results pending at the time of your discharge. Please request that your primary care M.D. follows up on these results.  Also Note the following: If you experience worsening of your admission symptoms, develop shortness of breath, life threatening emergency, suicidal or homicidal thoughts you must seek medical attention immediately by calling 911 or calling your MD immediately  if symptoms less severe.  You must read complete instructions/literature along with all the possible adverse reactions/side effects for all the Medicines you take and that have been prescribed to you. Take any new Medicines after you have completely understood and accpet all the possible adverse reactions/side effects.   Do not drive when taking Pain medications or sleeping medications (Benzodaizepines)  Do not take more than prescribed Pain, Sleep and Anxiety  Medications. It is not advisable to combine anxiety,sleep and pain medications without talking with your primary care practitioner  Special Instructions: If you  have smoked or chewed Tobacco  in the last 2 yrs please stop smoking, stop any regular Alcohol  and or any Recreational drug use.  Wear Seat belts while driving.  Please note: You were cared for by a hospitalist during your hospital stay. Once you are discharged, your primary care physician will handle any further medical issues. Please note that NO REFILLS for any discharge medications will be authorized once you are discharged, as it is imperative that you return to your primary care physician (or establish a relationship with a primary care physician if you do not have one) for your post hospital discharge needs so that they can reassess your need for medications and monitor your lab values.   Check CBGs before meals and at bedtime.  You were incidentally noted to have thyroid nodules on a CT neck done on 2/1-please ask your primary care practitioner to consider further work-up including fine-needle aspiration.  In some cases these nodules can turn cancerous.   Increase activity slowly   Complete by: As directed    No wound care   Complete by: As directed       Allergies as of 05/09/2021       Reactions   Apremilast Other (See Comments)   unknown   Latex Itching, Swelling, Rash        Medication List     STOP taking these medications    azithromycin 250 MG tablet Commonly known as: ZITHROMAX   cetirizine 10 MG tablet Commonly known as: ZYRTEC   Cosentyx Sensoready (300 MG) 150 MG/ML Soaj Generic drug: Secukinumab (300 MG Dose)   famotidine 20 MG tablet Commonly known as: PEPCID   HAIR/SKIN/NAILS PO   HYDROcodone-acetaminophen 5-325 MG tablet Commonly known as: NORCO/VICODIN   isosorbide mononitrate 30 MG 24 hr tablet Commonly known as: IMDUR   levofloxacin 500 MG tablet Commonly known as: LEVAQUIN   metoprolol tartrate 25 MG tablet Commonly known as: LOPRESSOR   multivitamin with minerals Tabs tablet   potassium chloride SA 20 MEQ tablet Commonly known  as: KLOR-CON M   Potassium Citrate 15 MEQ (1620 MG) Tbcr   predniSONE 10 MG (21) Tbpk tablet Commonly known as: STERAPRED UNI-PAK 21 TAB       TAKE these medications    acetaminophen 325 MG tablet Commonly known as: TYLENOL Take 650 mg by mouth every 6 (six) hours as needed for moderate pain or headache.   amLODipine 5 MG tablet Commonly known as: NORVASC Take 1 tablet (5 mg total) by mouth daily.   amoxicillin-clavulanate 875-125 MG tablet Commonly known as: AUGMENTIN Take 1 tablet by mouth every 12 (twelve) hours for 20 days. What changed: when to take this   aspirin 81 MG EC tablet Take 1 tablet (81 mg total) by mouth daily.   atorvastatin 80 MG tablet Commonly known as: LIPITOR TAKE 1 TABLET EVERY DAY  AT  6PM   B-complex with vitamin C tablet Place 1 tablet into feeding tube daily. Start taking on: May 10, 2021   cholecalciferol 25 MCG (1000 UNIT) tablet Commonly known as: VITAMIN D Take 1,000 Units by mouth in the morning and at bedtime.   escitalopram 10 MG tablet Commonly known as: LEXAPRO Take 10 mg by mouth as needed (anxiety, sleep).   feeding supplement Liqd Take 237 mLs by mouth 2 (two) times daily between meals.  guaiFENesin 600 MG 12 hr tablet Commonly known as: MUCINEX Take 600 mg by mouth 2 (two) times daily as needed (congestion.).   insulin aspart 100 UNIT/ML injection Commonly known as: novoLOG 0-6 Units, Subcutaneous, 3 times daily with meals CBG < 70: Implement Hypoglycemia measures CBG 70 - 120: 0 units CBG 121 - 150: 0 units CBG 151 - 200: 1 unit CBG 201-250: 2 units CBG 251-300: 3 units CBG 301-350: 4 units CBG 351-400: 5 units CBG > 400: Give 6 units and call MD   liraglutide 18 MG/3ML Sopn Commonly known as: VICTOZA Inject 1.8 mg into the skin at bedtime.   metFORMIN 1000 MG tablet Commonly known as: GLUCOPHAGE Take 1,000 mg by mouth 2 (two) times daily.   nitroGLYCERIN 0.4 MG SL tablet Commonly known as:  NITROSTAT DISSOLVE 1 TABLET UNDER THE TONGUE EVERY 5 MINUTES AS  NEEDED FOR CHEST PAIN. MAX  OF 3 TABLETS IN 15 MINUTES. CALL 911 IF PAIN PERSISTS. What changed: See the new instructions.   ondansetron 4 MG disintegrating tablet Commonly known as: ZOFRAN-ODT Take 4 mg by mouth every 8 (eight) hours as needed for nausea/vomiting.   pantoprazole 40 MG tablet Commonly known as: PROTONIX Take 1 tablet (40 mg total) by mouth daily.   QC TUMERIC COMPLEX PO Take 1 capsule by mouth daily.   Repatha Pushtronex System 420 MG/3.5ML Soct Generic drug: Evolocumab with Infusor Inject 420 mg into the skin every 30 (thirty) days.   sucralfate 1 g tablet Commonly known as: Carafate Take 1 tablet (1 g total) by mouth 4 (four) times daily as needed. What changed: reasons to take this   thiamine 100 MG tablet Take 2.5 tablets (250 mg total) by mouth daily.        Contact information for follow-up providers     Leeroy Cha, MD. Schedule an appointment as soon as possible for a visit in 1 week(s).   Specialty: Internal Medicine Contact information: [address] [place] [address] [phone]         Martinique, Peter M, MD. Schedule an appointment as soon as possible for a visit in 1 month(s).   Specialty: Cardiology Contact information: [address] [place] [address] [phone]              Contact information for after-discharge care     Destination     [facility] Preferred SNF .   Service: Skilled Nursing Contact information: [address] [address] [phone]                    Allergies  Allergen Reactions   Apremilast Other (See Comments)    unknown   Latex Itching, Swelling and Rash     Other Procedures/Studies: DG Chest 2 View  Result Date: 04/23/2021 CLINICAL DATA:  Pleural effusion. EXAM: CHEST - 2 VIEW COMPARISON:  April 06, 2021. FINDINGS:  Stable cardiomediastinal silhouette. Status post coronary bypass graft. Small right pleural effusion is noted. Right basilar atelectasis or infiltrate is noted. Left lung is clear. Bony thorax is unremarkable. IMPRESSION: Right basilar atelectasis or infiltrate is noted with small right pleural effusion. Electronically Signed   By: Marijo Conception M.D.   On: 04/23/2021 11:09   CT Head Wo Contrast  Result Date: 04/30/2021 CLINICAL DATA:  Mental status change, unknown cause EXAM: CT HEAD WITHOUT CONTRAST TECHNIQUE: Contiguous axial images were obtained from the base of the skull through the  vertex without intravenous contrast. RADIATION DOSE REDUCTION: This exam was performed according to the departmental dose-optimization program which includes automated exposure control, adjustment of the mA and/or kV according to patient size and/or use of iterative reconstruction technique. COMPARISON:  04/25/2021 FINDINGS: Brain: There is atrophy and chronic small vessel disease changes. No acute intracranial abnormality. Specifically, no hemorrhage, hydrocephalus, mass lesion, acute infarction, or significant intracranial injury. Vascular: No hyperdense vessel or unexpected calcification. Skull: No acute calvarial abnormality. Sinuses/Orbits: No acute findings Other: None IMPRESSION: Atrophy, chronic microvascular disease. No acute intracranial abnormality. Electronically Signed   By: Rolm Baptise M.D.   On: 04/30/2021 22:16   CT Head Wo Contrast  Result Date: 04/25/2021 CLINICAL DATA:  Head and neck trauma.  Focal neural findings EXAM: CT HEAD WITHOUT CONTRAST CT CERVICAL SPINE WITHOUT CONTRAST TECHNIQUE: Multidetector CT imaging of the head and cervical spine was performed following the standard protocol without intravenous contrast. Multiplanar CT image reconstructions of the cervical spine were also generated. RADIATION DOSE REDUCTION: This exam was performed according to the departmental dose-optimization program  which includes automated exposure control, adjustment of the mA and/or kV according to patient size and/or use of iterative reconstruction technique. COMPARISON:  MRI head 04/18/2021 FINDINGS: CT HEAD FINDINGS Brain: Ventricle size and cerebral volume within normal limits. Mild hypodensity in the white matter. Small hypodensity left post subinsular white matter unchanged from MRI and consistent with chronic ischemia Negative for acute infarct, hemorrhage, mass Vascular: Negative for hyperdense vessel Skull: Negative Sinuses/Orbits: Mild mucosal edema paranasal sinuses. Negative orbit Other: None CT CERVICAL SPINE FINDINGS Alignment: Normal Skull base and vertebrae: Negative for acute fracture. Chronic healed fracture left first rib. Soft tissues and spinal canal: Negative for soft tissue mass or adenopathy. Mild thyromegaly. Disc levels: Mild disc degeneration and mild spurring C3-4 and C4-5. Negative for spinal or foraminal stenosis. Degenerative changes C1-2. Upper chest: Small right pleural effusion Other: None IMPRESSION: 1. No acute intracranial abnormality 2. Negative for cervical spine fracture. Electronically Signed   By: Franchot Gallo M.D.   On: 04/25/2021 12:10   CT Soft Tissue Neck W Contrast  Result Date: 04/17/2021 CLINICAL DATA:  Difficulty swallowing EXAM: CT NECK WITH CONTRAST TECHNIQUE: Multidetector CT imaging of the neck was performed using the standard protocol following the bolus administration of intravenous contrast. RADIATION DOSE REDUCTION: This exam was performed according to the departmental dose-optimization program which includes automated exposure control, adjustment of the mA and/or kV according to patient size and/or use of iterative reconstruction technique. CONTRAST:  71mL OMNIPAQUE IOHEXOL 350 MG/ML SOLN COMPARISON:  None. FINDINGS: Pharynx and larynx: Normal. No mass or swelling. Salivary glands: No inflammation, mass, or stone. Thyroid: Hypoattenuating nodules in the enlarged  thyroid, the largest of which measures up to 1.7 cm. Lymph nodes: Normal. Vascular: Negative. Limited intracranial: Negative. Visualized orbits: Negative. Mastoids and visualized paranasal sinuses: Clear. Skeleton: No acute osseous abnormality. Upper chest: Small right pleural effusion.  Paraseptal emphysema. Other: None IMPRESSION: 1. No acute finding in the neck. No etiology seen for the patient's difficulty swallowing. 2. Hypoattenuating nodules in an enlarged thyroid, the largest of which measures up to 1.7 cm. If this has not previously been evaluated, a non-emergent ultrasound of the thyroid is recommended. (Reference: J Am Coll Radiol. 2015 Feb;12(2): 143-50) 3. Small right pleural effusion. Electronically Signed   By: Merilyn Baba M.D.   On: 04/17/2021 22:49   CT Cervical Spine Wo Contrast  Result Date: 04/25/2021 CLINICAL DATA:  Head and neck trauma.  Focal neural findings EXAM: CT HEAD WITHOUT CONTRAST CT CERVICAL SPINE WITHOUT CONTRAST TECHNIQUE: Multidetector CT imaging of the head and cervical spine was performed following the standard protocol without intravenous contrast. Multiplanar CT image reconstructions of the cervical spine were also generated. RADIATION DOSE REDUCTION: This exam was performed according to the departmental dose-optimization program which includes automated exposure control, adjustment of the mA and/or kV according to patient size and/or use of iterative reconstruction technique. COMPARISON:  MRI head 04/18/2021 FINDINGS: CT HEAD FINDINGS Brain: Ventricle size and cerebral volume within normal limits. Mild hypodensity in the white matter. Small hypodensity left post subinsular white matter unchanged from MRI and consistent with chronic ischemia Negative for acute infarct, hemorrhage, mass Vascular: Negative for hyperdense vessel Skull: Negative Sinuses/Orbits: Mild mucosal edema paranasal sinuses. Negative orbit Other: None CT CERVICAL SPINE FINDINGS Alignment: Normal Skull  base and vertebrae: Negative for acute fracture. Chronic healed fracture left first rib. Soft tissues and spinal canal: Negative for soft tissue mass or adenopathy. Mild thyromegaly. Disc levels: Mild disc degeneration and mild spurring C3-4 and C4-5. Negative for spinal or foraminal stenosis. Degenerative changes C1-2. Upper chest: Small right pleural effusion Other: None IMPRESSION: 1. No acute intracranial abnormality 2. Negative for cervical spine fracture. Electronically Signed   By: Franchot Gallo M.D.   On: 04/25/2021 12:10   MR BRAIN WO CONTRAST  Result Date: 04/18/2021 CLINICAL DATA:  61 year old female with dizziness. Difficulty swallowing and unintentional weight loss. EXAM: MRI HEAD WITHOUT CONTRAST TECHNIQUE: Multiplanar, multiecho pulse sequences of the brain and surrounding structures were obtained without intravenous contrast. COMPARISON:  Neck CT 04/17/2021.  Brain MRI 09/24/2012. FINDINGS: Brain: Cerebral volume has not significantly changed since 2014. Chronic partially empty sella. No restricted diffusion to suggest acute infarction. No midline shift, mass effect, evidence of mass lesion, ventriculomegaly, extra-axial collection or acute intracranial hemorrhage. Cervicomedullary junction is within normal limits. Small area of chronic cortical and subcortical white matter encephalomalacia in the posterior left MCA territory, left parietal lobe series 10, image 20. Patchy additional bilateral cerebral white matter T2 and FLAIR hyperintensity has mildly progressed since 2014. Moderate new T2 heterogeneity in the bilateral pons (series 11, image 10). But no other cortical encephalomalacia. No chronic cerebral blood products. Deep gray matter nuclei and cerebellum remain within normal limits. Vascular: Major intracranial vascular flow voids are stable since 2014. Skull and upper cervical spine: Negative. Visualized bone marrow signal is within normal limits. Sinuses/Orbits: Paranasal sinuses are  stable and negative. Questionable exophthalmos, but no intraorbital mass or inflammation. No extraocular muscle enlargement. Abnormal thyroid noted on recent CT. Other: Grossly normal visible internal auditory structures. Mastoids are clear. Normal stylomastoid foramina. Negative visible scalp and face. IMPRESSION: 1. No acute intracranial abnormality. 2. Small chronic infarct in the posterior left MCA territory. Up to moderate for age signal heterogeneity in the pons and cerebral white matter with progression since 2014 is compatible with chronic small vessel disease. 3. Questionable exophthalmos, with heterogeneous enlarged thyroid on recent Neck CT. Consider hyperthyroidism and thyroid eye disease. Recommend thyroid ultrasound (ref: J Am Coll Radiol. 2015 Feb;12(2): 143-50). Electronically Signed   By: Genevie Ann M.D.   On: 04/18/2021 05:42   US RENAL  Result Date: 05/01/2021 CLINICAL DATA:  Acute kidney injury EXAM: RENAL / URINARY TRACT ULTRASOUND COMPLETE COMPARISON:  CT 04/02/2021 FINDINGS: Right Kidney: Renal measurements: 11.5 x 5.0 x 5.8 cm = volume: 173.8 mL. Mildly increased renal cortical echogenicity. No hydronephrosis. Left Kidney: Renal measurements: 10.0 x 5.5 x  3.45 cm = volume: 96.3 mL. Mildly increased renal cortical echogenicity. No hydronephrosis. Bladder: Mildly distended.  Foley catheter in place. Other: None. IMPRESSION: No hydronephrosis. Mildly increased renal cortical echogenicity as can be seen in medical renal disease. Electronically Signed   By: Maurine Simmering M.D.   On: 05/01/2021 16:14   DG CHEST PORT 1 VIEW  Result Date: 05/03/2021 CLINICAL DATA:  Right pleural effusion. EXAM: PORTABLE CHEST 1 VIEW COMPARISON:  May 02, 2021. FINDINGS: Stable cardiomediastinal silhouette. Status post coronary bypass graft. Feeding tube is seen entering stomach. Mild right basilar atelectasis or infiltrate is noted with small right pleural effusion. Minimal left basilar subsegmental atelectasis  is noted. Bony thorax is unremarkable. IMPRESSION: Mild right basilar atelectasis or infiltrate is noted with small right pleural effusion. Electronically Signed   By: Marijo Conception M.D.   On: 05/03/2021 06:52   DG CHEST PORT 1 VIEW  Result Date: 05/02/2021 CLINICAL DATA:  Pleural effusion EXAM: PORTABLE CHEST 1 VIEW COMPARISON:  Chest radiograph 1 day prior FINDINGS: The enteric catheter courses below the diaphragm and off the field of view. Median sternotomy wires and mediastinal surgical clips are stable. The cardiomediastinal silhouette is stable. There is a trace right pleural effusion, not significantly changed. There is no significant left effusion. There is no overt pulmonary edema. There is no new or worsening focal airspace disease. There is no pneumothorax. The bones are stable. IMPRESSION: Trace right pleural effusion, not significantly changed. Overall, no significant interval change with no new or worsening focal airspace disease. Electronically Signed   By: Valetta Mole M.D.   On: 05/02/2021 08:14   DG CHEST PORT 1 VIEW  Result Date: 05/01/2021 CLINICAL DATA:  Status post right sided thoracentesis. EXAM: PORTABLE CHEST 1 VIEW COMPARISON:  Chest radiograph dated April 30, 2021 FINDINGS: The heart is enlarged. Evidence of prior CABG. Atherosclerotic calcification of the aortic arch. Interval decrease in the right pleural effusion. Right basilar atelectasis and trace effusion. Left lung is clear. No appreciable pneumothorax. Interval placement of the feeding tube with distal tip below the diaphragm but not included on this image. IMPRESSION: 1. Status post thoracentesis with interval decrease in right pleural effusion. Minimal right basilar atelectasis. No appreciable pneumothorax. 2. Interval placement of feeding tube with distal tip below the diaphragm, but not included on this image. 3.  No other significant interval change. Electronically Signed   By: Keane Police D.O.   On: 05/01/2021  16:39   DG Chest Port 1 View  Result Date: 04/30/2021 CLINICAL DATA:  Altered mental status/sepsis EXAM: PORTABLE CHEST 1 VIEW COMPARISON:  Chest x-ray 04/23/2021, chest x-ray 03/02/2017 FINDINGS: Enlarged cardiac silhouette. The heart and mediastinal contours are unchanged. Aortic calcification. No focal consolidation. No pulmonary edema. Small volume right pleural effusion. No pneumothorax. No acute osseous abnormality. Cardiac surgical hardware overlies the mediastinum. Possible right shoulder calcific tendinosis. IMPRESSION: 1. Small volume right pleural effusion. 2.  Aortic Atherosclerosis (ICD10-I70.0). Electronically Signed   By: Iven Finn M.D.   On: 04/30/2021 19:48   DG Abd Portable 1V  Result Date: 05/06/2021 CLINICAL DATA:  Nasogastric tube placement. EXAM: PORTABLE ABDOMEN - 1 VIEW COMPARISON:  05/01/2021. FINDINGS: Interval removal of the feeding tube and placement of a nasogastric tube with the side port several cm beyond the gastroesophageal junction. Gas is seen in nondilated small bowel and colon. Degenerative changes in the spine and hips. IMPRESSION: Nasogastric tube terminates in the stomach. Electronically Signed   By: Lorin Picket M.D.  On: 05/06/2021 08:19   DG Abd Portable 1V  Result Date: 05/01/2021 CLINICAL DATA:  NG tube placement EXAM: PORTABLE ABDOMEN - 1 VIEW COMPARISON:  07/05/2020 FINDINGS: Bowel gas pattern is nonspecific. No abnormal masses or calcifications are seen. Pelvis is not included in its entirety. Tip of enteric tube is seen in the antrum of the stomach. Metallic sutures are seen in the sternum. Heart is enlarged in size. IMPRESSION: Tip of enteric tube is seen in the antrum of the stomach. Bowel gas pattern is nonspecific. Electronically Signed   By: Elmer Picker M.D.   On: 05/01/2021 14:51   ECHOCARDIOGRAM COMPLETE  Result Date: 05/01/2021    ECHOCARDIOGRAM REPORT   Patient Name:   [name] [name] Date of Exam: 05/01/2021 Medical Rec #:   [id number]       Height:       66.0 in Accession #:    [id number]      Weight:       171.5 lb Date of Birth:  07-10-1960       BSA:          1.873 m Patient Age:    87 years        BP:           150/115 mmHg Patient Gender: F               HR:           101 bpm. Exam Location:  Inpatient Procedure: 2D Echo, Cardiac Doppler, Color Doppler and Intracardiac            Opacification Agent Indications:    CHF  History:        Patient has prior history of Echocardiogram examinations, most                 recent 03/03/2017. CAD and Previous Myocardial Infarction,                 Stroke; Risk Factors:Hypertension, Dyslipidemia and Diabetes.  Sonographer:    Luisa Hart RDCS Referring Phys: [id number] [place]  1. Left ventricular ejection fraction, by estimation, is 45%. The left ventricle has mildly decreased function. The left ventricle demonstrates regional wall motion abnormalities with basal to mid inferior and basal to mid inferolateral akinesis suggestive of prior MI. There is moderate left ventricular hypertrophy. Left ventricular diastolic parameters are consistent with Grade I diastolic dysfunction (impaired relaxation).  2. Right ventricular systolic function is normal. The right ventricular size is normal. There is normal pulmonary artery systolic pressure. The estimated right ventricular systolic pressure is 83.3 mmHg.  3. The mitral valve is normal in structure. No evidence of mitral valve regurgitation. No evidence of mitral stenosis.  4. The aortic valve is tricuspid. Aortic valve regurgitation is not visualized. Aortic valve sclerosis/calcification is present, without any evidence of aortic stenosis.  5. The inferior vena cava is normal in size with greater than 50% respiratory variability, suggesting right atrial pressure of 3 mmHg. FINDINGS  Left Ventricle: Left ventricular ejection fraction, by estimation, is 45%. The left ventricle has mildly decreased function. The left ventricle  demonstrates regional wall motion abnormalities. Definity contrast agent was given IV to delineate the left ventricular endocardial borders. The left ventricular internal cavity size was normal in size. There is moderate left ventricular hypertrophy. Left ventricular diastolic parameters are consistent with Grade I diastolic dysfunction (impaired relaxation). Right Ventricle: The right ventricular size is normal. No increase in right ventricular wall thickness.  Right ventricular systolic function is normal. There is normal pulmonary artery systolic pressure. The tricuspid regurgitant velocity is 2.85 m/s, and  with an assumed right atrial pressure of 3 mmHg, the estimated right ventricular systolic pressure is 92.1 mmHg. Left Atrium: Left atrial size was normal in size. Right Atrium: Right atrial size was normal in size. Pericardium: There is no evidence of pericardial effusion. Mitral Valve: The mitral valve is normal in structure. No evidence of mitral valve regurgitation. No evidence of mitral valve stenosis. MV peak gradient, 12.8 mmHg. The mean mitral valve gradient is 5.0 mmHg. Tricuspid Valve: The tricuspid valve is normal in structure. Tricuspid valve regurgitation is trivial. Aortic Valve: The aortic valve is tricuspid. Aortic valve regurgitation is not visualized. Aortic valve sclerosis/calcification is present, without any evidence of aortic stenosis. Aortic valve mean gradient measures 5.0 mmHg. Aortic valve peak gradient measures 8.9 mmHg. Aortic valve area, by VTI measures 3.28 cm. Pulmonic Valve: The pulmonic valve was normal in structure. Pulmonic valve regurgitation is not visualized. Aorta: The aortic root is normal in size and structure. Venous: The inferior vena cava is normal in size with greater than 50% respiratory variability, suggesting right atrial pressure of 3 mmHg. IAS/Shunts: No atrial level shunt detected by color flow Doppler.  LEFT VENTRICLE PLAX 2D LVIDd:         3.60 cm      Diastology LVIDs:         2.80 cm     LV e' medial:    5.66 cm/s LV PW:         1.30 cm     LV E/e' medial:  15.8 LV IVS:        1.40 cm     LV e' lateral:   5.11 cm/s LVOT diam:     2.20 cm     LV E/e' lateral: 17.5 LV SV:         73 LV SV Index:   39 LVOT Area:     3.80 cm  LV Volumes (MOD) LV vol d, MOD A2C: 52.3 ml LV vol d, MOD A4C: 65.2 ml LV vol s, MOD A2C: 50.6 ml LV vol s, MOD A4C: 41.5 ml LV SV MOD A2C:     1.7 ml LV SV MOD A4C:     65.2 ml LV SV MOD BP:      12.2 ml RIGHT VENTRICLE RV Basal diam:  3.30 cm RV Mid diam:    2.30 cm LEFT ATRIUM             Index        RIGHT ATRIUM           Index LA Vol (A2C):   51.5 ml 27.49 ml/m  RA Area:     13.80 cm LA Vol (A4C):   44.3 ml 23.65 ml/m  RA Volume:   33.80 ml  18.04 ml/m LA Biplane Vol: 47.5 ml 25.35 ml/m  AORTIC VALVE                    PULMONIC VALVE AV Area (Vmax):    3.10 cm     PV Vmax:       1.44 m/s AV Area (Vmean):   3.13 cm     PV Vmean:      91.900 cm/s AV Area (VTI):     3.28 cm     PV VTI:        0.204 m AV Vmax:  149.50 cm/s  PV Peak grad:  8.3 mmHg AV Vmean:          98.650 cm/s  PV Mean grad:  4.5 mmHg AV VTI:            0.222 m AV Peak Grad:      8.9 mmHg AV Mean Grad:      5.0 mmHg LVOT Vmax:         122.00 cm/s LVOT Vmean:        81.200 cm/s LVOT VTI:          0.191 m LVOT/AV VTI ratio: 0.86  AORTA Ao Asc diam: 3.20 cm MITRAL VALVE                TRICUSPID VALVE MV Area (PHT): 4.49 cm     TR Peak grad:   32.5 mmHg MV Area VTI:   2.00 cm     TR Vmax:        285.00 cm/s MV Peak grad:  12.8 mmHg MV Mean grad:  5.0 mmHg     SHUNTS MV Vmax:       1.79 m/s     Systemic VTI:  0.19 m MV Vmean:      98.9 cm/s    Systemic Diam: 2.20 cm MV Decel Time: 169 msec MV E velocity: 89.40 cm/s MV A velocity: 149.00 cm/s MV E/A ratio:  0.60 Dalton McleanMD Electronically signed by Franki Monte Signature Date/Time: 05/01/2021/4:48:48 PM    Final      TODAY-DAY OF DISCHARGE:  Subjective:   [name] today has no headache,no  chest abdominal pain,no new weakness tingling or numbness, feels much better wants to go home today.   Objective:   Blood pressure 132/84, pulse 96, temperature 97.8 F (36.6 C), temperature source Oral, resp. rate (!) 21, height 5\' 6"  (1.676 m), weight 75.8 kg, SpO2 100 %. No intake or output data in the 24 hours ending 05/09/21 1109 Filed Weights   05/05/21 0500 05/07/21 0435 05/08/21 0500  Weight: 79 kg 70.5 kg 75.8 kg    Exam: Awake Alert, Oriented *3, No new F.N deficits, Normal affect [place].AT,PERRAL Supple Neck,No JVD, No cervical lymphadenopathy appriciated.  Symmetrical Chest wall movement, Good air movement bilaterally, CTAB RRR,No Gallops,Rubs or new Murmurs, No Parasternal Heave +ve B.Sounds, Abd Soft, Non tender, No organomegaly appriciated, No rebound -guarding or rigidity. No Cyanosis, Clubbing or edema, No new Rash or bruise   PERTINENT RADIOLOGIC STUDIES: No results found.   PERTINENT LAB RESULTS: CBC: Recent Labs    05/07/21 0118 05/09/21 0407  WBC 4.4 8.4  HGB 11.7* 10.6*  HCT 36.2 33.1*  PLT 127* 241   CMET CMP     Component Value Date/Time   NA 143 05/08/2021 0316   NA 145 (H) 10/03/2019 1455   K 4.2 05/08/2021 0316   CL 109 05/08/2021 0316   CO2 26 05/08/2021 0316   GLUCOSE 65 (L) 05/08/2021 0316   BUN 5 (L) 05/08/2021 0316   BUN 13 10/03/2019 1455   CREATININE 0.60 05/08/2021 0316   CALCIUM 8.8 (L) 05/08/2021 0316   PROT 5.0 (L) 05/04/2021 0323   PROT 7.3 10/03/2019 1455   ALBUMIN 1.9 (L) 05/04/2021 0323   ALBUMIN 4.2 10/03/2019 1455   AST 102 (H) 05/04/2021 0323   ALT 57 (H) 05/04/2021 0323   ALKPHOS 75 05/04/2021 0323   BILITOT 0.9 05/04/2021 0323   BILITOT 0.4 10/03/2019 1455   GFRNONAA >60 05/08/2021 0316   GFRAA >60 12/12/2019  1341    GFR Estimated Creatinine Clearance: 75.9 mL/min (by C-G formula based on SCr of 0.6 mg/dL). No results for input(s): LIPASE, AMYLASE in the last 72 hours. No results for input(s): CKTOTAL, CKMB,  CKMBINDEX, TROPONINI in the last 72 hours. Invalid input(s): POCBNP No results for input(s): DDIMER in the last 72 hours. No results for input(s): HGBA1C in the last 72 hours. No results for input(s): CHOL, HDL, LDLCALC, TRIG, CHOLHDL, LDLDIRECT in the last 72 hours. No results for input(s): TSH, T4TOTAL, T3FREE, THYROIDAB in the last 72 hours.  Invalid input(s): FREET3 No results for input(s): VITAMINB12, FOLATE, FERRITIN, TIBC, IRON, RETICCTPCT in the last 72 hours. Coags: No results for input(s): INR in the last 72 hours.  Invalid input(s): PT Microbiology: Recent Results (from the past 240 hour(s))  Resp Panel by RT-PCR (Flu A&B, Covid) Urine, In & Out Cath     Status: None   Collection Time: 04/30/21  6:36 PM   Specimen: Urine, In & Out Cath; Nasopharyngeal(NP) swabs in vial transport medium  Result Value Ref Range Status   SARS Coronavirus 2 by RT PCR NEGATIVE NEGATIVE Final    Comment: (NOTE) SARS-CoV-2 target nucleic acids are NOT DETECTED.  The SARS-CoV-2 RNA is generally detectable in upper respiratory specimens during the acute phase of infection. The lowest concentration of SARS-CoV-2 viral copies this assay can detect is 138 copies/mL. A negative result does not preclude SARS-Cov-2 infection and should not be used as the sole basis for treatment or other patient management decisions. A negative result may occur with  improper specimen collection/handling, submission of specimen other than nasopharyngeal swab, presence of viral mutation(s) within the areas targeted by this assay, and inadequate number of viral copies(<138 copies/mL). A negative result must be combined with clinical observations, patient history, and epidemiological information. The expected result is Negative.  Fact Sheet for Patients:  EntrepreneurPulse.com.au  Fact Sheet for Healthcare Providers:  IncredibleEmployment.be  This test is no t yet approved or  cleared by the [place] FDA and  has been authorized for detection and/or diagnosis of SARS-CoV-2 by FDA under an Emergency Use Authorization (EUA). This EUA will remain  in effect (meaning this test can be used) for the duration of the COVID-19 declaration under Section 564(b)(1) of the Act, 21 U.S.C.section 360bbb-3(b)(1), unless the authorization is terminated  or revoked sooner.       Influenza A by PCR NEGATIVE NEGATIVE Final   Influenza B by PCR NEGATIVE NEGATIVE Final    Comment: (NOTE) The Xpert Xpress SARS-CoV-2/FLU/RSV plus assay is intended as an aid in the diagnosis of influenza from Nasopharyngeal swab specimens and should not be used as a sole basis for treatment. Nasal washings and aspirates are unacceptable for Xpert Xpress SARS-CoV-2/FLU/RSV testing.  Fact Sheet for Patients: EntrepreneurPulse.com.au  Fact Sheet for Healthcare Providers: IncredibleEmployment.be  This test is not yet approved or cleared by the [place] FDA and has been authorized for detection and/or diagnosis of SARS-CoV-2 by FDA under an Emergency Use Authorization (EUA). This EUA will remain in effect (meaning this test can be used) for the duration of the COVID-19 declaration under Section 564(b)(1) of the Act, 21 U.S.C. section 360bbb-3(b)(1), unless the authorization is terminated or revoked.  Performed at [facility], [place] [address] [place], [place] [address]   Urine Culture     Status: None   Collection Time: 04/30/21  6:44 PM   Specimen: In/Out Cath Urine  Result Value Ref Range Status  Specimen Description IN/OUT CATH URINE  Final   Special Requests NONE  Final   Culture   Final    NO GROWTH Performed at [facility], [place] [address], [place], [place] [address]    Report Status 05/02/2021 FINAL  Final  Culture, blood (Routine x 2)     Status: None   Collection Time: 04/30/21  6:55 PM   Specimen: BLOOD  Result Value  Ref Range Status   Specimen Description BLOOD LEFT ANTECUBITAL  Final   Special Requests   Final    BOTTLES DRAWN AEROBIC AND ANAEROBIC Blood Culture adequate volume   Culture   Final    NO GROWTH 5 DAYS Performed at [facility], [place] [address] [place], [place] [address]    Report Status 05/05/2021 FINAL  Final  MRSA Next Gen by PCR, Nasal     Status: None   Collection Time: 05/01/21  4:14 AM   Specimen: Nasal Mucosa; Nasal Swab  Result Value Ref Range Status   MRSA by PCR Next Gen NOT DETECTED NOT DETECTED Final    Comment: (NOTE) The GeneXpert MRSA Assay (FDA approved for NASAL specimens only), is one component of a comprehensive MRSA colonization surveillance program. It is not intended to diagnose MRSA infection nor to guide or monitor treatment for MRSA infections. Test performance is not FDA approved in patients less than 22 years old. Performed at [facility], [place] [address] [place], [place] [address]   Acid Fast Smear (AFB)     Status: None   Collection Time: 05/01/21  4:13 PM   Specimen: Pleural  Result Value Ref Range Status   AFB Specimen Processing Concentration  Final   Acid Fast Smear Negative  Final    Comment: (NOTE) Performed At: [facility] [place], [place] [id number] Rush Farmer MD TK:[id number]    Source (AFB) FLUID  Final    Comment: Performed at [facility], [place] [address], [place], [place] [address]  Body fluid culture w Gram Stain     Status: None   Collection Time: 05/01/21  4:21 PM   Specimen: Pleural Fluid  Result Value Ref Range Status   Specimen Description PLEURAL  Final   Special Requests NONE  Final   Gram Stain   Final    RARE WBC PRESENT, PREDOMINANTLY MONONUCLEAR NO ORGANISMS SEEN    Culture   Final    NO GROWTH 3 DAYS Performed at [facility], [place] [address] [place],  [address]    Report Status 05/05/2021 FINAL  Final    FURTHER DISCHARGE INSTRUCTIONS:  Get  Medicines reviewed and adjusted: Please take all your medications with you for your next visit with your Primary MD  Laboratory/radiological data: Please request your Primary MD to go over all hospital tests and procedure/radiological results at the follow up, please ask your Primary MD to get all Hospital records sent to his/her office.  In some cases, they will be blood work, cultures and biopsy results pending at the time of your discharge. Please request that your primary care M.D. goes through all the records of your hospital data and follows up on these results.  Also Note the following: If you experience worsening of your admission symptoms, develop shortness of breath, life threatening emergency, suicidal or homicidal thoughts you must seek medical attention immediately by calling 911 or calling your MD immediately  if symptoms less severe.  You must read complete instructions/literature  along with all the possible adverse reactions/side effects for all the Medicines you take and that have been prescribed to you. Take any new Medicines after you have completely understood and accpet all the possible adverse reactions/side effects.   Do not drive when taking Pain medications or sleeping medications (Benzodaizepines)  Do not take more than prescribed Pain, Sleep and Anxiety Medications. It is not advisable to combine anxiety,sleep and pain medications without talking with your primary care practitioner  Special Instructions: If you have smoked or chewed Tobacco  in the last 2 yrs please stop smoking, stop any regular Alcohol  and or any Recreational drug use.  Wear Seat belts while driving.  Please note: You were cared for by a hospitalist during your hospital stay. Once you are discharged, your primary care physician will handle any further medical issues. Please note that NO REFILLS for any discharge medications will be authorized once you are discharged, as it is imperative that you  return to your primary care physician (or establish a relationship with a primary care physician if you do not have one) for your post hospital discharge needs so that they can reassess your need for medications and monitor your lab values.  Total Time spent coordinating discharge including counseling, education and face to face time equals greater than 30 minutes.  SignedOren Binet 05/09/2021 11:09 AM

## 2021-05-09 NOTE — TOC Progression Note (Signed)
Transition of Care [facility]) - Progression Note    Patient Details  Name: [name] [name] MRN: [id number] Date of Birth: Aug 22, 1960  [facility]) CM/SW [place], LCSW Phone Number: 05/09/2021, 10:16 AM  Clinical Narrative:    [facility] has received insurance approval for patient to discharge there today. COVID test requested.    Expected Discharge Plan: Skilled Nursing Facility Barriers to Discharge: Insurance Authorization  Expected Discharge Plan and Services Expected Discharge Plan: [place] In-house Referral: Clinical Social Work   Post Acute Care Choice: [place] Living arrangements for the past 2 months: Apartment                                       Social Determinants of Health (SDOH) Interventions    Readmission Risk Interventions No flowsheet data found.

## 2021-05-09 NOTE — TOC Transition Note (Signed)
Transition of Care [facility]) - CM/SW Discharge Note   Patient Details  Name: [name] [name] MRN: [id number] Date of Birth: 01/20/1961  [facility] of [facility]) CM/SW Contact:  Benard Halsted, LCSW Phone Number: 05/09/2021, 12:45 PM   Clinical Narrative:    Patient will DC to: [facility] Anticipated DC date: 05/09/21 Family notified: Son, [name] Transport by: Corey Harold   Per MD patient ready for DC to [facility]. RN to call report prior to discharge [phone]). RN, patient, patient's family, and facility notified of DC. Discharge Summary and FL2 sent to facility. DC packet on chart. Ambulance transport requested for patient.   CSW will sign off for now as social work intervention is no longer needed. Please consult [place] again if new needs arise.     Final next level of care: Skilled Nursing Facility Barriers to Discharge: Barriers Resolved   Patient Goals and CMS Choice Patient states their goals for this hospitalization and ongoing recovery are:: be able to move on my own CMS Medicare.gov Compare Post Acute Care list provided to:: Patient Choice offered to / list presented to : Patient  Discharge Placement   Existing PASRR number confirmed : 05/09/21          Patient chooses bed at: [facility] Patient to be transferred to facility by: [facility] Name of family member notified: Son, [place] Patient and family notified of of transfer: 05/09/21  Discharge Plan and Services In-house Referral: Clinical Social Work   Post Acute Care Choice: [place]                               Social Determinants of Health (SDOH) Interventions     Readmission Risk Interventions No flowsheet data found.

## 2021-05-10 ENCOUNTER — Non-Acute Institutional Stay (SKILLED_NURSING_FACILITY): Payer: Medicare HMO | Admitting: Adult Health

## 2021-05-10 ENCOUNTER — Encounter: Payer: Self-pay | Admitting: Adult Health

## 2021-05-10 DIAGNOSIS — E876 Hypokalemia: Secondary | ICD-10-CM

## 2021-05-10 DIAGNOSIS — F339 Major depressive disorder, recurrent, unspecified: Secondary | ICD-10-CM | POA: Diagnosis not present

## 2021-05-10 DIAGNOSIS — F329 Major depressive disorder, single episode, unspecified: Secondary | ICD-10-CM

## 2021-05-10 DIAGNOSIS — E1165 Type 2 diabetes mellitus with hyperglycemia: Secondary | ICD-10-CM | POA: Diagnosis not present

## 2021-05-10 DIAGNOSIS — A419 Sepsis, unspecified organism: Secondary | ICD-10-CM

## 2021-05-10 DIAGNOSIS — E440 Moderate protein-calorie malnutrition: Secondary | ICD-10-CM | POA: Diagnosis not present

## 2021-05-10 DIAGNOSIS — I10 Essential (primary) hypertension: Secondary | ICD-10-CM

## 2021-05-10 DIAGNOSIS — Z794 Long term (current) use of insulin: Secondary | ICD-10-CM | POA: Diagnosis not present

## 2021-05-10 DIAGNOSIS — I2510 Atherosclerotic heart disease of native coronary artery without angina pectoris: Secondary | ICD-10-CM

## 2021-05-10 NOTE — Progress Notes (Signed)
p  Location:  [facility] Room Number: 216-A Place of Service:  SNF (31) Provider:  Durenda Age, DNP, FNP-BC  Patient Care Team: Leeroy Cha, MD as PCP - General (Internal Medicine) Martinique, Peter M, MD as PCP - Cardiology (Cardiology)  Extended Emergency Contact Information Primary Emergency Contact: Demauro,[name] Address: [address]          [place], [place] [address] Johnnette Litter of [place] Phone: [phone] Relation: Son Secondary Emergency Contact: [place] Mobile Phone: [phone] Relation: Son  Code Status:  Full Code  Goals of care: Advanced Directive information Advanced Directives 05/10/2021  Does Patient Have a Medical Advance Directive? Yes  Type of Advance Directive Out of facility DNR (pink MOST or yellow form)  Does patient want to make changes to medical advance directive? No - Patient declined  Would patient like information on creating a medical advance directive? -  Pre-existing out of facility DNR order (yellow form or pink MOST form) Pink MOST form placed in chart (order not valid for inpatient use)     Chief Complaint  Patient presents with   Hospitalization Follow-up    HPI:  [name] who was admitted to [facility] on 05/09/21 post hospital admission 04/30/2021 to 05/09/2021.  She has a PMH of psoriatic arthritis, diabetes mellitus type 2, hypertension and CAD s/p CABG.  She presented to the hospital on 04/30/2021 with confusion, hypotension and fever. She was found to have septic shock due to empyema. She underwent thoracentesis on 05/01/2021.  She was initially started on Zosyn then transitioned to Augmentin x3 weeks per recommendation of ID, Dr. Juleen China.  She was admitted to [facility] for short-term rehabilitation.  Past Medical History:  Diagnosis Date   Acute ST elevation myocardial infarction (STEMI) involving right coronary artery ([place]) 10/04/2015    x 2   Anxiety    Anxiety and depression    CAD (coronary artery disease)    a. CABG 06/2015: LIMA-LAD, SVG-RCA, SVG-OM2 b. STEMI s/p PCI to distal RCA lesion with a small Promus Premier stent. ( patent LIMA to the LAD, occluded SVG to OM, occluded SVG to RCA.)   CAD (coronary artery disease) of artery bypass graft; occluded SVG to RCA and occluded SVG to OM 10/08/2015   Empty sella (HCC)    GERD (gastroesophageal reflux disease)    Headache    from stress   Hematuria wed 06-13-2020   Hidradenitis axillaris    History of kidney stones    Hyperlipemia    Hypertension    Lupus ([place]) dx'd 2008   "they think I have this; have to get retested" (06/12/2015)   Obesity (BMI 30.0-34.9)    Pneumonia X 2   last time yrs ago   PONV (postoperative nausea and vomiting)    likes iv med or scopolamine patch works well   Psoriasis 06/15/2020   hands elbows, ears, small amount on scalp and bridge of nose   Rheumatoid aortitis    psorriatic arthritis   Rheumatoid arthritis ([place])    S/P angioplasty with stent; 10/04/15 to distal RCA lesion. with Promus premier. 10/08/2015   Scoliosis    Sleep apnea    patient states "it is mild" no cpap needed   Stroke [facility]) March 2014   left MCA branches; "they say I have them all the time", denies residual on 06/12/2015   Tobacco abuse    Type II diabetes mellitus ([place])    Urinary frequency  and urgency   Wears glasses    for reading   Past Surgical History:  Procedure Laterality Date   ABDOMINAL HYSTERECTOMY  31 yrs ago   total has ovaies   BREAST BIOPSY Bilateral yrd ago   benign   CARDIAC CATHETERIZATION N/A 06/13/2015   Procedure: Left Heart Cath and Coronary Angiography;  Surgeon: Peter M Martinique, MD;  Location: [facility] CV LAB;  Service: Cardiovascular;  Laterality: N/A;   CARDIAC CATHETERIZATION N/A 10/04/2015   Procedure: Left Heart Cath and Coronary Angiography;  Surgeon: [name], MD;  Location: [facility];  Service:  Cardiovascular;  Laterality: N/A;   CARDIAC CATHETERIZATION N/A 10/04/2015   Procedure: Coronary Stent Intervention;  Surgeon: [name], MD;  Location: [facility];  Service: Cardiovascular;  Laterality: N/A;   CORONARY ARTERY BYPASS GRAFT N/A 06/19/2015   Procedure: CORONARY ARTERY BYPASS GRAFTING (CABG) TIMES FOUR USING LEFT INTERNAL MAMMARY, RIGHT SAPHENOUS LEG VEIN AND CRYO SAPHENOUS VEIN;  Surgeon: [name], MD;  Location: [facility];  Service: Open Heart Surgery;  Laterality: N/A;   CYSTOSCOPY WITH RETROGRADE PYELOGRAM, URETEROSCOPY AND STENT PLACEMENT Left 06/21/2020   Procedure: CYSTOSCOPY WITH RETROGRADE PYELOGRAM, URETEROSCOPY, BASKET STONE EXTRACTION AND  STENT PLACEMENT;  Surgeon: Robley Fries, MD;  Location: [facility];  Service: Urology;  Laterality: Left;   DILATION AND CURETTAGE OF UTERUS  yrs ago   HOLMIUM LASER APPLICATION Left 11/21/6732   Procedure: HOLMIUM LASER APPLICATION;  Surgeon: Robley Fries, MD;  Location: [facility];  Service: Urology;  Laterality: Left;   HYDRADENITIS EXCISION Right 12/20/2019   Procedure: EXCISION OF RIGHT  HIDRADENITIS AXILLARY;  Surgeon: Donnie Mesa, MD;  Location: [facility];  Service: General;  Laterality: Right;   IR URETERAL STENT LEFT NEW ACCESS W/O SEP NEPHROSTOMY CATH  01/10/2019   NEPHROLITHOTOMY Left 01/10/2019   Procedure: NEPHROLITHOTOMY PERCUTANEOUS;  Surgeon: Kathie Rhodes, MD;  Location: [facility];  Service: Urology;  Laterality: Left;   TEE WITHOUT CARDIOVERSION N/A 06/19/2015   Procedure: TRANSESOPHAGEAL ECHOCARDIOGRAM (TEE);  Surgeon: [name], MD;  Location: [facility];  Service: Open Heart Surgery;  Laterality: N/A;   TONSILLECTOMY  age 31   TUBAL LIGATION  many yrs ago    Allergies  Allergen Reactions   Apremilast Other (See Comments)    unknown   Latex Itching, Swelling and Rash    Outpatient Encounter Medications as of 05/10/2021  Medication Sig   acetaminophen  (TYLENOL) 325 MG tablet Take 650 mg by mouth every 6 (six) hours as needed for moderate pain or headache.   amLODipine (NORVASC) 5 MG tablet Take 1 tablet (5 mg total) by mouth daily.   amoxicillin-clavulanate (AUGMENTIN) 875-125 MG tablet Take 1 tablet by mouth every 12 (twelve) hours for 20 days.   aspirin EC 81 MG EC tablet Take 1 tablet (81 mg total) by mouth daily.   atorvastatin (LIPITOR) 80 MG tablet TAKE 1 TABLET EVERY DAY  AT  6PM   B Complex-C (B-COMPLEX WITH VITAMIN C) tablet Place 1 tablet into feeding tube daily.   bisacodyl (DULCOLAX) 10 MG suppository If not relieved by MOM, give 10 mg Bisacodyl suppositiory rectally X 1 dose in 24 hours as needed   cholecalciferol (VITAMIN D) 25 MCG (1000 UNIT) tablet Take 1,000 Units by mouth in the morning and at bedtime.   escitalopram (LEXAPRO) 10 MG tablet Take 10 mg by mouth as needed (anxiety, sleep).   Evolocumab with Infusor (  [place]) 420 MG/3.5ML SOCT Inject 420 mg into the skin every 30 (thirty) days.   feeding supplement (ENSURE ENLIVE / ENSURE PLUS) LIQD Take 237 mLs by mouth 2 (two) times daily between meals.   guaiFENesin (MUCINEX) 600 MG 12 hr tablet Take 600 mg by mouth 2 (two) times daily as needed (congestion.).   Insulin Aspart FlexPen (NOVOLOG) 100 UNIT/ML INJECT 5 UNITS SUBCUTANEOUSLY DAILY BEFORE EACH MEAL FOR CBG >/=300 (PRIME PEN W/2 UNITS PRIOR TO EACH USE)   levofloxacin (LEVAQUIN) 500 MG tablet Take 500 mg by mouth at bedtime. PLURAL EFFUSION   liraglutide (VICTOZA) 18 MG/3ML SOPN Inject 1.8 mg into the skin at bedtime.    Magnesium Hydroxide (MILK OF MAGNESIA PO) If no BM in 3 days, give 30 cc Milk of Magnesium p.o. x 1 dose in 24 hours as needed (Do not use standing constipation orders for residents with renal failure CFR less than 30. Contact MD for orders)   metFORMIN (GLUCOPHAGE) 1000 MG tablet Take 1,000 mg by mouth 2 (two) times daily.   nitroGLYCERIN (NITROSTAT) 0.4 MG SL tablet DISSOLVE 1 TABLET  UNDER THE TONGUE EVERY 5 MINUTES AS  NEEDED FOR CHEST PAIN. MAX  OF 3 TABLETS IN 15 MINUTES. CALL 911 IF PAIN PERSISTS. (Patient taking differently: Place 0.4 mg under the tongue every 5 (five) minutes as needed for chest pain.)   ondansetron (ZOFRAN-ODT) 4 MG disintegrating tablet Take 4 mg by mouth every 8 (eight) hours as needed for nausea/vomiting.   pantoprazole (PROTONIX) 40 MG tablet Take 1 tablet (40 mg total) by mouth daily.   thiamine 100 MG tablet Take 2.5 tablets (250 mg total) by mouth daily.   sucralfate (CARAFATE) 1 g tablet Take 1 tablet (1 g total) by mouth 4 (four) times daily as needed. (Patient taking differently: Take 1 g by mouth 4 (four) times daily as needed (stomach pain).)   Turmeric (QC TUMERIC COMPLEX PO) Take 1 capsule by mouth daily.   [DISCONTINUED] insulin aspart (NOVOLOG) 100 UNIT/ML injection 0-6 Units, Subcutaneous, 3 times daily with meals CBG < 70: Implement Hypoglycemia measures CBG 70 - 120: 0 units CBG 121 - 150: 0 units CBG 151 - 200: 1 unit CBG 201-250: 2 units CBG 251-300: 3 units CBG 301-350: 4 units CBG 351-400: 5 units CBG > 400: Give 6 units and call MD (Patient taking differently: No sig reported)   No facility-administered encounter medications on file as of 05/10/2021.    Review of Systems  Constitutional:  Negative for appetite change, chills, fatigue and fever.  HENT:  Negative for congestion, hearing loss, rhinorrhea and sore throat.   Eyes: Negative.   Respiratory:  Negative for cough, shortness of breath and wheezing.   Cardiovascular:  Negative for chest pain, palpitations and leg swelling.  Gastrointestinal:  Negative for abdominal pain, constipation, diarrhea, nausea and vomiting.  Genitourinary:  Negative for dysuria.  Skin:  Negative for color change, rash and wound.  Neurological:  Negative for dizziness, weakness and headaches.  Psychiatric/Behavioral:  Negative for behavioral problems. The patient is not nervous/anxious.        Immunization History  Administered Date(s) Administered   Influenza-Unspecified 11/15/2013, 04/04/2015   Pneumococcal Polysaccharide-23 10/27/2013, 05/06/2021   Tdap 02/08/2016   Pertinent  Health Maintenance Due  Topic Date Due   OPHTHALMOLOGY EXAM  Never done   URINE MICROALBUMIN  Never done   PAP SMEAR-Modifier  Never done   COLONOSCOPY (Pts 45-42yrs Insurance coverage will need to be confirmed)  Never done   HEMOGLOBIN A1C  10/28/2021   FOOT EXAM  12/25/2021   MAMMOGRAM  03/06/2023   INFLUENZA VACCINE  Completed   Fall Risk 05/07/2021 05/08/2021 05/08/2021 05/08/2021 05/09/2021  Falls in the past year? - - - - -  Was there an injury with Fall? - - - - -  Fall Risk Category Calculator - - - - -  Fall Risk Category - - - - -  Patient Fall Risk Level High fall risk High fall risk High fall risk High fall risk High fall risk  Patient at Risk for Falls Due to - - - - -  Patient at Risk for Falls Due to - - - - -     Vitals:   05/10/21 1454  BP: 103/66  Pulse: 74  Resp: 18  Temp: 97.7 F (36.5 C)  SpO2: 98%  Weight: 151 lb 6.4 oz (68.7 kg)  Height: 5\' 6"  (1.676 m)   Body mass index is 24.44 kg/m.  Physical Exam Constitutional:      Appearance: Normal appearance.  HENT:     Head: Normocephalic and atraumatic.     Nose: Nose normal.     Mouth/Throat:     Mouth: Mucous membranes are moist.  Eyes:     Conjunctiva/sclera: Conjunctivae normal.  Cardiovascular:     Rate and Rhythm: Normal rate and regular rhythm.  Pulmonary:     Effort: Pulmonary effort is normal.     Breath sounds: Normal breath sounds.  Abdominal:     General: Bowel sounds are normal.     Palpations: Abdomen is soft.  Musculoskeletal:        General: Normal range of motion.     Cervical back: Normal range of motion.  Skin:    General: Skin is warm and dry.     Comments: Black discoloration under eyes, nose, bilateral sides of mouth, elbows, fingernails and toenails.   Neurological:      General: No focal deficit present.     Mental Status: She is alert and oriented to person, place, and time.  Psychiatric:        Mood and Affect: Mood normal.        Behavior: Behavior normal.        Thought Content: Thought content normal.        Judgment: Judgment normal.       Labs reviewed: Recent Labs    05/04/21 0323 05/05/21 0142 05/05/21 0741 05/07/21 0118 05/08/21 0316  NA 140 138  --  141 143  K 3.4* 3.3*  --  3.4* 4.2  CL 106 104  --  104 109  CO2 25 26  --  29 26  GLUCOSE 268* 202*  --  82 65*  BUN 8 5*  --  <5* 5*  CREATININE 0.56 0.45  --  0.54 0.60  CALCIUM 7.5* 7.8*  --  8.8* 8.8*  MG 2.0 1.7  --   --  2.1  PHOS 1.1* <1.0* 2.0* 3.2 3.0   Recent Labs    04/17/21 1425 04/30/21 1834 05/01/21 1633 05/04/21 0323  AST 28 35  --  102*  ALT 13 15  --  57*  ALKPHOS 60 49  --  75  BILITOT 1.2 1.2  --  0.9  PROT 7.4 8.2* 6.7 5.0*  ALBUMIN 2.7* 3.1*  --  1.9*   Recent Labs    04/30/21 1834 04/30/21 2130 05/02/21 0248 05/03/21 0245 05/04/21 0323 05/05/21 0142 05/07/21 0118 05/09/21  0407  WBC 9.7   < > 6.9 5.0   < > 7.1 4.4 8.4  NEUTROABS 8.4*  --  5.8 3.3  --   --   --   --   HGB 16.4*   < > 12.2 12.7   < > 11.4* 11.7* 10.6*  HCT 53.1*   < > 39.3 40.5   < > 36.6 36.2 33.1*  MCV 95.5   < > 94.2 91.6   < > 91.7 90.0 91.7  PLT 201   < > 91* 77*   < > 80* 127* 241   < > = values in this interval not displayed.   Lab Results  Component Value Date   TSH 0.092 (L) 04/30/2021   Lab Results  Component Value Date   HGBA1C 6.7 (H) 04/30/2021   Lab Results  Component Value Date   CHOL 128 10/03/2019   HDL 63 10/03/2019   LDLCALC 32 10/03/2019   TRIG 211 (H) 10/03/2019   CHOLHDL 2.0 10/03/2019    Significant Diagnostic Results in last 30 days:  DG Chest 2 View  Result Date: 04/23/2021 CLINICAL DATA:  Pleural effusion. EXAM: CHEST - 2 VIEW COMPARISON:  April 06, 2021. FINDINGS: Stable cardiomediastinal silhouette. Status post coronary bypass  graft. Small right pleural effusion is noted. Right basilar atelectasis or infiltrate is noted. Left lung is clear. Bony thorax is unremarkable. IMPRESSION: Right basilar atelectasis or infiltrate is noted with small right pleural effusion. Electronically Signed   By: Marijo Conception M.D.   On: 04/23/2021 11:09   CT Head Wo Contrast  Result Date: 04/30/2021 CLINICAL DATA:  Mental status change, unknown cause EXAM: CT HEAD WITHOUT CONTRAST TECHNIQUE: Contiguous axial images were obtained from the base of the skull through the vertex without intravenous contrast. RADIATION DOSE REDUCTION: This exam was performed according to the departmental dose-optimization program which includes automated exposure control, adjustment of the mA and/or kV according to patient size and/or use of iterative reconstruction technique. COMPARISON:  04/25/2021 FINDINGS: Brain: There is atrophy and chronic small vessel disease changes. No acute intracranial abnormality. Specifically, no hemorrhage, hydrocephalus, mass lesion, acute infarction, or significant intracranial injury. Vascular: No hyperdense vessel or unexpected calcification. Skull: No acute calvarial abnormality. Sinuses/Orbits: No acute findings Other: None IMPRESSION: Atrophy, chronic microvascular disease. No acute intracranial abnormality. Electronically Signed   By: Rolm Baptise M.D.   On: 04/30/2021 22:16   CT Head Wo Contrast  Result Date: 04/25/2021 CLINICAL DATA:  Head and neck trauma.  Focal neural findings EXAM: CT HEAD WITHOUT CONTRAST CT CERVICAL SPINE WITHOUT CONTRAST TECHNIQUE: Multidetector CT imaging of the head and cervical spine was performed following the standard protocol without intravenous contrast. Multiplanar CT image reconstructions of the cervical spine were also generated. RADIATION DOSE REDUCTION: This exam was performed according to the departmental dose-optimization program which includes automated exposure control, adjustment of the mA  and/or kV according to patient size and/or use of iterative reconstruction technique. COMPARISON:  MRI head 04/18/2021 FINDINGS: CT HEAD FINDINGS Brain: Ventricle size and cerebral volume within normal limits. Mild hypodensity in the white matter. Small hypodensity left post subinsular white matter unchanged from MRI and consistent with chronic ischemia Negative for acute infarct, hemorrhage, mass Vascular: Negative for hyperdense vessel Skull: Negative Sinuses/Orbits: Mild mucosal edema paranasal sinuses. Negative orbit Other: None CT CERVICAL SPINE FINDINGS Alignment: Normal Skull base and vertebrae: Negative for acute fracture. Chronic healed fracture left first rib. Soft tissues and spinal canal: Negative for soft tissue  mass or adenopathy. Mild thyromegaly. Disc levels: Mild disc degeneration and mild spurring C3-4 and C4-5. Negative for spinal or foraminal stenosis. Degenerative changes C1-2. Upper chest: Small right pleural effusion Other: None IMPRESSION: 1. No acute intracranial abnormality 2. Negative for cervical spine fracture. Electronically Signed   By: Franchot Gallo M.D.   On: 04/25/2021 12:10   CT Soft Tissue Neck W Contrast  Result Date: 04/17/2021 CLINICAL DATA:  Difficulty swallowing EXAM: CT NECK WITH CONTRAST TECHNIQUE: Multidetector CT imaging of the neck was performed using the standard protocol following the bolus administration of intravenous contrast. RADIATION DOSE REDUCTION: This exam was performed according to the departmental dose-optimization program which includes automated exposure control, adjustment of the mA and/or kV according to patient size and/or use of iterative reconstruction technique. CONTRAST:  84mL OMNIPAQUE IOHEXOL 350 MG/ML SOLN COMPARISON:  None. FINDINGS: Pharynx and larynx: Normal. No mass or swelling. Salivary glands: No inflammation, mass, or stone. Thyroid: Hypoattenuating nodules in the enlarged thyroid, the largest of which measures up to 1.7 cm. Lymph  nodes: Normal. Vascular: Negative. Limited intracranial: Negative. Visualized orbits: Negative. Mastoids and visualized paranasal sinuses: Clear. Skeleton: No acute osseous abnormality. Upper chest: Small right pleural effusion.  Paraseptal emphysema. Other: None IMPRESSION: 1. No acute finding in the neck. No etiology seen for the patient's difficulty swallowing. 2. Hypoattenuating nodules in an enlarged thyroid, the largest of which measures up to 1.7 cm. If this has not previously been evaluated, a non-emergent ultrasound of the thyroid is recommended. (Reference: J Am Coll Radiol. 2015 Feb;12(2): 143-50) 3. Small right pleural effusion. Electronically Signed   By: Merilyn Baba M.D.   On: 04/17/2021 22:49   CT Cervical Spine Wo Contrast  Result Date: 04/25/2021 CLINICAL DATA:  Head and neck trauma.  Focal neural findings EXAM: CT HEAD WITHOUT CONTRAST CT CERVICAL SPINE WITHOUT CONTRAST TECHNIQUE: Multidetector CT imaging of the head and cervical spine was performed following the standard protocol without intravenous contrast. Multiplanar CT image reconstructions of the cervical spine were also generated. RADIATION DOSE REDUCTION: This exam was performed according to the departmental dose-optimization program which includes automated exposure control, adjustment of the mA and/or kV according to patient size and/or use of iterative reconstruction technique. COMPARISON:  MRI head 04/18/2021 FINDINGS: CT HEAD FINDINGS Brain: Ventricle size and cerebral volume within normal limits. Mild hypodensity in the white matter. Small hypodensity left post subinsular white matter unchanged from MRI and consistent with chronic ischemia Negative for acute infarct, hemorrhage, mass Vascular: Negative for hyperdense vessel Skull: Negative Sinuses/Orbits: Mild mucosal edema paranasal sinuses. Negative orbit Other: None CT CERVICAL SPINE FINDINGS Alignment: Normal Skull base and vertebrae: Negative for acute fracture. Chronic  healed fracture left first rib. Soft tissues and spinal canal: Negative for soft tissue mass or adenopathy. Mild thyromegaly. Disc levels: Mild disc degeneration and mild spurring C3-4 and C4-5. Negative for spinal or foraminal stenosis. Degenerative changes C1-2. Upper chest: Small right pleural effusion Other: None IMPRESSION: 1. No acute intracranial abnormality 2. Negative for cervical spine fracture. Electronically Signed   By: Franchot Gallo M.D.   On: 04/25/2021 12:10   MR BRAIN WO CONTRAST  Result Date: 04/18/2021 CLINICAL DATA:  61 year old female with dizziness. Difficulty swallowing and unintentional weight loss. EXAM: MRI HEAD WITHOUT CONTRAST TECHNIQUE: Multiplanar, multiecho pulse sequences of the brain and surrounding structures were obtained without intravenous contrast. COMPARISON:  Neck CT 04/17/2021.  Brain MRI 09/24/2012. FINDINGS: Brain: Cerebral volume has not significantly changed since 2014. Chronic partially empty sella.  No restricted diffusion to suggest acute infarction. No midline shift, mass effect, evidence of mass lesion, ventriculomegaly, extra-axial collection or acute intracranial hemorrhage. Cervicomedullary junction is within normal limits. Small area of chronic cortical and subcortical white matter encephalomalacia in the posterior left MCA territory, left parietal lobe series 10, image 20. Patchy additional bilateral cerebral white matter T2 and FLAIR hyperintensity has mildly progressed since 2014. Moderate new T2 heterogeneity in the bilateral pons (series 11, image 10). But no other cortical encephalomalacia. No chronic cerebral blood products. Deep gray matter nuclei and cerebellum remain within normal limits. Vascular: Major intracranial vascular flow voids are stable since 2014. Skull and upper cervical spine: Negative. Visualized bone marrow signal is within normal limits. Sinuses/Orbits: Paranasal sinuses are stable and negative. Questionable exophthalmos, but no  intraorbital mass or inflammation. No extraocular muscle enlargement. Abnormal thyroid noted on recent CT. Other: Grossly normal visible internal auditory structures. Mastoids are clear. Normal stylomastoid foramina. Negative visible scalp and face. IMPRESSION: 1. No acute intracranial abnormality. 2. Small chronic infarct in the posterior left MCA territory. Up to moderate for age signal heterogeneity in the pons and cerebral white matter with progression since 2014 is compatible with chronic small vessel disease. 3. Questionable exophthalmos, with heterogeneous enlarged thyroid on recent Neck CT. Consider hyperthyroidism and thyroid eye disease. Recommend thyroid ultrasound (ref: J Am Coll Radiol. 2015 Feb;12(2): 143-50). Electronically Signed   By: Genevie Ann M.D.   On: 04/18/2021 05:42   US RENAL  Result Date: 05/01/2021 CLINICAL DATA:  Acute kidney injury EXAM: RENAL / URINARY TRACT ULTRASOUND COMPLETE COMPARISON:  CT 04/02/2021 FINDINGS: Right Kidney: Renal measurements: 11.5 x 5.0 x 5.8 cm = volume: 173.8 mL. Mildly increased renal cortical echogenicity. No hydronephrosis. Left Kidney: Renal measurements: 10.0 x 5.5 x 3.45 cm = volume: 96.3 mL. Mildly increased renal cortical echogenicity. No hydronephrosis. Bladder: Mildly distended.  Foley catheter in place. Other: None. IMPRESSION: No hydronephrosis. Mildly increased renal cortical echogenicity as can be seen in medical renal disease. Electronically Signed   By: Maurine Simmering M.D.   On: 05/01/2021 16:14   DG CHEST PORT 1 VIEW  Result Date: 05/03/2021 CLINICAL DATA:  Right pleural effusion. EXAM: PORTABLE CHEST 1 VIEW COMPARISON:  May 02, 2021. FINDINGS: Stable cardiomediastinal silhouette. Status post coronary bypass graft. Feeding tube is seen entering stomach. Mild right basilar atelectasis or infiltrate is noted with small right pleural effusion. Minimal left basilar subsegmental atelectasis is noted. Bony thorax is unremarkable. IMPRESSION:  Mild right basilar atelectasis or infiltrate is noted with small right pleural effusion. Electronically Signed   By: Marijo Conception M.D.   On: 05/03/2021 06:52   DG CHEST PORT 1 VIEW  Result Date: 05/02/2021 CLINICAL DATA:  Pleural effusion EXAM: PORTABLE CHEST 1 VIEW COMPARISON:  Chest radiograph 1 day prior FINDINGS: The enteric catheter courses below the diaphragm and off the field of view. Median sternotomy wires and mediastinal surgical clips are stable. The cardiomediastinal silhouette is stable. There is a trace right pleural effusion, not significantly changed. There is no significant left effusion. There is no overt pulmonary edema. There is no new or worsening focal airspace disease. There is no pneumothorax. The bones are stable. IMPRESSION: Trace right pleural effusion, not significantly changed. Overall, no significant interval change with no new or worsening focal airspace disease. Electronically Signed   By: Valetta Mole M.D.   On: 05/02/2021 08:14   DG CHEST PORT 1 VIEW  Result Date: 05/01/2021 CLINICAL DATA:  Status post  right sided thoracentesis. EXAM: PORTABLE CHEST 1 VIEW COMPARISON:  Chest radiograph dated April 30, 2021 FINDINGS: The heart is enlarged. Evidence of prior CABG. Atherosclerotic calcification of the aortic arch. Interval decrease in the right pleural effusion. Right basilar atelectasis and trace effusion. Left lung is clear. No appreciable pneumothorax. Interval placement of the feeding tube with distal tip below the diaphragm but not included on this image. IMPRESSION: 1. Status post thoracentesis with interval decrease in right pleural effusion. Minimal right basilar atelectasis. No appreciable pneumothorax. 2. Interval placement of feeding tube with distal tip below the diaphragm, but not included on this image. 3.  No other significant interval change. Electronically Signed   By: Keane Police D.O.   On: 05/01/2021 16:39   DG Chest Port 1 View  Result Date:  04/30/2021 CLINICAL DATA:  Altered mental status/sepsis EXAM: PORTABLE CHEST 1 VIEW COMPARISON:  Chest x-ray 04/23/2021, chest x-ray 03/02/2017 FINDINGS: Enlarged cardiac silhouette. The heart and mediastinal contours are unchanged. Aortic calcification. No focal consolidation. No pulmonary edema. Small volume right pleural effusion. No pneumothorax. No acute osseous abnormality. Cardiac surgical hardware overlies the mediastinum. Possible right shoulder calcific tendinosis. IMPRESSION: 1. Small volume right pleural effusion. 2.  Aortic Atherosclerosis (ICD10-I70.0). Electronically Signed   By: Iven Finn M.D.   On: 04/30/2021 19:48   DG Abd Portable 1V  Result Date: 05/06/2021 CLINICAL DATA:  Nasogastric tube placement. EXAM: PORTABLE ABDOMEN - 1 VIEW COMPARISON:  05/01/2021. FINDINGS: Interval removal of the feeding tube and placement of a nasogastric tube with the side port several cm beyond the gastroesophageal junction. Gas is seen in nondilated small bowel and colon. Degenerative changes in the spine and hips. IMPRESSION: Nasogastric tube terminates in the stomach. Electronically Signed   By: Lorin Picket M.D.   On: 05/06/2021 08:19   DG Abd Portable 1V  Result Date: 05/01/2021 CLINICAL DATA:  NG tube placement EXAM: PORTABLE ABDOMEN - 1 VIEW COMPARISON:  07/05/2020 FINDINGS: Bowel gas pattern is nonspecific. No abnormal masses or calcifications are seen. Pelvis is not included in its entirety. Tip of enteric tube is seen in the antrum of the stomach. Metallic sutures are seen in the sternum. Heart is enlarged in size. IMPRESSION: Tip of enteric tube is seen in the antrum of the stomach. Bowel gas pattern is nonspecific. Electronically Signed   By: Elmer Picker M.D.   On: 05/01/2021 14:51   ECHOCARDIOGRAM COMPLETE  Result Date: 05/01/2021    ECHOCARDIOGRAM REPORT   Patient Name:   MICHON KACZMAREK Date of Exam: 05/01/2021 Medical Rec #:  [id number]       Height:       66.0 in Accession  #:    [id number]      Weight:       171.5 lb Date of Birth:  [birth year]       BSA:          1.873 m Patient Age:    38 years        BP:           150/115 mmHg Patient Gender: F               HR:           101 bpm. Exam Location:  Inpatient Procedure: 2D Echo, Cardiac Doppler, Color Doppler and Intracardiac            Opacification Agent Indications:    CHF  History:        Patient has  prior history of Echocardiogram examinations, most                 recent 03/03/2017. CAD and Previous Myocardial Infarction,                 Stroke; Risk Factors:Hypertension, Dyslipidemia and Diabetes.  Sonographer:    Luisa Hart RDCS Referring Phys: [id number] [place]  1. Left ventricular ejection fraction, by estimation, is 45%. The left ventricle has mildly decreased function. The left ventricle demonstrates regional wall motion abnormalities with basal to mid inferior and basal to mid inferolateral akinesis suggestive of prior MI. There is moderate left ventricular hypertrophy. Left ventricular diastolic parameters are consistent with Grade I diastolic dysfunction (impaired relaxation).  2. Right ventricular systolic function is normal. The right ventricular size is normal. There is normal pulmonary artery systolic pressure. The estimated right ventricular systolic pressure is 47.8 mmHg.  3. The mitral valve is normal in structure. No evidence of mitral valve regurgitation. No evidence of mitral stenosis.  4. The aortic valve is tricuspid. Aortic valve regurgitation is not visualized. Aortic valve sclerosis/calcification is present, without any evidence of aortic stenosis.  5. The inferior vena cava is normal in size with greater than 50% respiratory variability, suggesting right atrial pressure of 3 mmHg. FINDINGS  Left Ventricle: Left ventricular ejection fraction, by estimation, is 45%. The left ventricle has mildly decreased function. The left ventricle demonstrates regional wall motion abnormalities.  Definity contrast agent was given IV to delineate the left ventricular endocardial borders. The left ventricular internal cavity size was normal in size. There is moderate left ventricular hypertrophy. Left ventricular diastolic parameters are consistent with Grade I diastolic dysfunction (impaired relaxation). Right Ventricle: The right ventricular size is normal. No increase in right ventricular wall thickness. Right ventricular systolic function is normal. There is normal pulmonary artery systolic pressure. The tricuspid regurgitant velocity is 2.85 m/s, and  with an assumed right atrial pressure of 3 mmHg, the estimated right ventricular systolic pressure is 29.5 mmHg. Left Atrium: Left atrial size was normal in size. Right Atrium: Right atrial size was normal in size. Pericardium: There is no evidence of pericardial effusion. Mitral Valve: The mitral valve is normal in structure. No evidence of mitral valve regurgitation. No evidence of mitral valve stenosis. MV peak gradient, 12.8 mmHg. The mean mitral valve gradient is 5.0 mmHg. Tricuspid Valve: The tricuspid valve is normal in structure. Tricuspid valve regurgitation is trivial. Aortic Valve: The aortic valve is tricuspid. Aortic valve regurgitation is not visualized. Aortic valve sclerosis/calcification is present, without any evidence of aortic stenosis. Aortic valve mean gradient measures 5.0 mmHg. Aortic valve peak gradient measures 8.9 mmHg. Aortic valve area, by VTI measures 3.28 cm. Pulmonic Valve: The pulmonic valve was normal in structure. Pulmonic valve regurgitation is not visualized. Aorta: The aortic root is normal in size and structure. Venous: The inferior vena cava is normal in size with greater than 50% respiratory variability, suggesting right atrial pressure of 3 mmHg. IAS/Shunts: No atrial level shunt detected by color flow Doppler.  LEFT VENTRICLE PLAX 2D LVIDd:         3.60 cm     Diastology LVIDs:         2.80 cm     LV e' medial:     5.66 cm/s LV PW:         1.30 cm     LV E/e' medial:  15.8 LV IVS:        1.40  cm     LV e' lateral:   5.11 cm/s LVOT diam:     2.20 cm     LV E/e' lateral: 17.5 LV SV:         73 LV SV Index:   39 LVOT Area:     3.80 cm  LV Volumes (MOD) LV vol d, MOD A2C: 52.3 ml LV vol d, MOD A4C: 65.2 ml LV vol s, MOD A2C: 50.6 ml LV vol s, MOD A4C: 41.5 ml LV SV MOD A2C:     1.7 ml LV SV MOD A4C:     65.2 ml LV SV MOD BP:      12.2 ml RIGHT VENTRICLE RV Basal diam:  3.30 cm RV Mid diam:    2.30 cm LEFT ATRIUM             Index        RIGHT ATRIUM           Index LA Vol (A2C):   51.5 ml 27.49 ml/m  RA Area:     13.80 cm LA Vol (A4C):   44.3 ml 23.65 ml/m  RA Volume:   33.80 ml  18.04 ml/m LA Biplane Vol: 47.5 ml 25.35 ml/m  AORTIC VALVE                    PULMONIC VALVE AV Area (Vmax):    3.10 cm     PV Vmax:       1.44 m/s AV Area (Vmean):   3.13 cm     PV Vmean:      91.900 cm/s AV Area (VTI):     3.28 cm     PV VTI:        0.204 m AV Vmax:           149.50 cm/s  PV Peak grad:  8.3 mmHg AV Vmean:          98.650 cm/s  PV Mean grad:  4.5 mmHg AV VTI:            0.222 m AV Peak Grad:      8.9 mmHg AV Mean Grad:      5.0 mmHg LVOT Vmax:         122.00 cm/s LVOT Vmean:        81.200 cm/s LVOT VTI:          0.191 m LVOT/AV VTI ratio: 0.86  AORTA Ao Asc diam: 3.20 cm MITRAL VALVE                TRICUSPID VALVE MV Area (PHT): 4.49 cm     TR Peak grad:   32.5 mmHg MV Area VTI:   2.00 cm     TR Vmax:        285.00 cm/s MV Peak grad:  12.8 mmHg MV Mean grad:  5.0 mmHg     SHUNTS MV Vmax:       1.79 m/s     Systemic VTI:  0.19 m MV Vmean:      98.9 cm/s    Systemic Diam: 2.20 cm MV Decel Time: 169 msec MV E velocity: 89.40 cm/s MV A velocity: 149.00 cm/s MV E/A ratio:  0.60 Dalton McleanMD Electronically signed by Franki Monte Signature Date/Time: 05/01/2021/4:48:48 PM    Final     Assessment/Plan  1. Sepsis, due to unspecified organism, unspecified whether acute organ dysfunction present [facility]) -  S/P thoracentesis on  05/01/2021.  She was initially started on  Zosyn then transitioned to Augmentin x3 weeks per recommendation of ID, Dr. Juleen China -  pleural AFB was smear was negative  2. Hypokalemia Lab Results  Component Value Date   K 4.2 05/08/2021   - repleted  3. Type 2 diabetes mellitus with hyperglycemia, with long-term current use of insulin (HCC) Lab Results  Component Value Date   HGBA1C 6.7 (H) 04/30/2021   -  Continue Victoza 1.8 mg injection daily in insulin aspart 5 units subcutaneously before each meal for CBG >= 300 -    Monitor CBGs  4. Essential hypertension -Continue amlodipine 5 mg 1 tab daily  5. Coronary artery disease involving native coronary artery of native heart, unspecified whether angina present -  S/P CABG in 2017 and PCI/DES to RCA -   continue NTG PRN,  aspirin 81 mg 1 tab daily -   Follow up with cardiology in 1 month, Dr. [name]  6. Moderate Protein-calorie malnutrition ([place]) -  continue Ensure Enlive 6  7. Major depression, recurrent, chronic (HCC) -   Continue escitalopram 10 mg 1 tab at bedtime    Family/ staff Communication: Discussed plan of care with resident and charge nurse.  Labs/tests ordered:  For CBC and CMP in 1 week    Durenda Age, DNP, MSN, FNP-BC [place] [phone] (Monday-Friday 8:00 a.m. - 5:00 p.m.) [phone] (after hours)

## 2021-05-14 ENCOUNTER — Other Ambulatory Visit: Payer: Self-pay | Admitting: Adult Health

## 2021-05-14 ENCOUNTER — Encounter (HOSPITAL_COMMUNITY): Payer: Self-pay | Admitting: Radiology

## 2021-05-14 ENCOUNTER — Encounter: Payer: Self-pay | Admitting: Internal Medicine

## 2021-05-14 ENCOUNTER — Non-Acute Institutional Stay (SKILLED_NURSING_FACILITY): Payer: Medicare HMO | Admitting: Internal Medicine

## 2021-05-14 DIAGNOSIS — G9340 Encephalopathy, unspecified: Secondary | ICD-10-CM

## 2021-05-14 DIAGNOSIS — E042 Nontoxic multinodular goiter: Secondary | ICD-10-CM | POA: Diagnosis not present

## 2021-05-14 DIAGNOSIS — N179 Acute kidney failure, unspecified: Secondary | ICD-10-CM

## 2021-05-14 DIAGNOSIS — R6521 Severe sepsis with septic shock: Secondary | ICD-10-CM

## 2021-05-14 DIAGNOSIS — R410 Disorientation, unspecified: Secondary | ICD-10-CM

## 2021-05-14 DIAGNOSIS — E1165 Type 2 diabetes mellitus with hyperglycemia: Secondary | ICD-10-CM

## 2021-05-14 DIAGNOSIS — E440 Moderate protein-calorie malnutrition: Secondary | ICD-10-CM | POA: Diagnosis not present

## 2021-05-14 DIAGNOSIS — A419 Sepsis, unspecified organism: Secondary | ICD-10-CM

## 2021-05-14 DIAGNOSIS — M069 Rheumatoid arthritis, unspecified: Secondary | ICD-10-CM

## 2021-05-14 DIAGNOSIS — M329 Systemic lupus erythematosus, unspecified: Secondary | ICD-10-CM

## 2021-05-14 MED ORDER — TRAMADOL HCL 50 MG PO TABS: 50.0000 mg | ORAL_TABLET | Freq: Three times a day (TID) | ORAL | 0 refills | Status: AC | PRN

## 2021-05-14 NOTE — Progress Notes (Signed)
NURSING HOME LOCATION:  [facility] ROOM NUMBER:  [id number]  CODE STATUS:  Full Code  PCP:  Leeroy Cha MD  This is a comprehensive admission note to this SNFperformed on this date less than 30 days from date of admission. Included are preadmission medical/surgical history; reconciled medication list; family history; social history and comprehensive review of systems.  Corrections and additions to the records were documented. Comprehensive physical exam was also performed. Additionally a clinical summary was entered for each active diagnosis pertinent to this admission in the Problem List to enhance continuity of care.  HPI: Patient was hospitalized 2/14 - 05/09/2021 presenting with mental status changes of confusion in context of fever and hypotension. Chest x-ray revealed right lower lobe atelectasis versus infiltrate with effusion in the context . Sepic shock due to empyema was diagnosed based on sepsis physiology.  Lactic acid was up to 8.7. Thoracentesis was performed 2/15; all cultures were negative.  Gram stain revealed rare white cells mainly mononuclear.  It contained 1700 total nucleated cells and was turbid in appearance.  Cytology favored atypical reactive mesothelial cells.  Zosyn was initiated empirically and transitioned subsequently to additional 3 weeks of Augmentin. Acute metabolic encephalopathy was attributed to sepsis, AKI, and hyponatremia.  CT of the head was negative.  Clinical improvement was noted but improvement was slow. Mild transaminitis was present with AST of 102 and ALT of 57.  LDH was 1154.  Albumin was 1.9 and total protein 5.0 indicating significant protein/caloric malnutrition. Course was complicated by acute urinary retention in the context of acute illness with debility and deconditioning.  Foley catheter was able to be discontinued on 2/21 as she was able to void spontaneously. Echo revealed EF of 45% with grade 1 diastolic  dysfunction. Incidental finding on CT of the soft tissue of the neck PTA had revealed thyroid nodules for which outpatient work-up was to be considered.  TSH on 2/14 was 0.092 with low normal free T4 and free T3. Diabetes was treated with SSI; A1c was 6.7% indicating excellent control. Thiamine deficiency was addressed with supplementation. Labs on 2/22 revealed creatinine of 0.6 with GFR greater than 60.  Because of debility and deconditioning SNF placement for rehab was recommended.  Past medical and surgical history: includes CAD with history of STEMI, history of anxiety/depression, empty sella syndrome, GERD, essential hypertension,rheumatoid arthritis with false positive ANA, , OSA, history of stroke, and diabetes with vascular disease. Surgeries and procedures include hysterectomy, nephrolithotomy, coronary artery angioplasty with stent and CABG.  Social history: Nondrinker; former smoker with an 18-pack-year of consumption.  Family history: Extensive history reviewed.She states her mother & 11 M aunts had thyroid surgery.  Her maternal grandmother had a goiter.   Review of systems: She states that she was hospitalized because she was dehydrated as she was having difficulty swallowing.  Actually she states she has had or difficulty swallowing for over a year.  Initially they were concerned she might have thrush.  She states that food "hangs in my throat".  [facility] is evaluating her.  She was to have a barium swallow but fell outside the doctor's office on the way to the study.  CT of the neck revealed thyroid nodules.  Because of the swallowing difficulties she has lost over 31 pounds since September 2022.  Speech therapy here at the SNF has placed her on a mechanical soft thin diet.  Speech therapy notes that she coughs and clears her throat throughout meals.  She describes  loose stool without frank diarrhea. She has chronic right-sided weakness which is exacerbated if her psoriasis flares.   She has difficulty sitting up and getting out of bed because of the weakness.  This is exacerbated if she is having increasing weakness in the right lower extremity which limits her range of motion.  She states that the right foot will "give way". She also describes intermittent low back pain. She seems to describe some paroxysmal nocturnal dyspnea.  She denies any active angina. She describes vertigo if she is sitting and turns to the left but not to the right.  Constitutional: No fever presently  Eyes: No redness, discharge, pain, vision change ENT/mouth: No nasal congestion, purulent discharge, earache, change in hearing, sore throat  Cardiovascular: No chest pain, palpitations, claudication, edema  Respiratory: No cough, sputum production, hemoptysis, DOE, significant snoring, apnea  Gastrointestinal: No  nausea /vomiting, rectal bleeding, melena, change in bowels Genitourinary: No dysuria, hematuria, pyuria, incontinence, nocturia Dermatologic: No rash, pruritus, change in appearance of skin Neurologic: No headache, syncope, seizures, numbness, tingling Psychiatric: No significant  insomnia, anorexia Endocrine: No change in hair/skin/nails, excessive thirst, excessive hunger, excessive urination  Hematologic/lymphatic: No significant bruising, lymphadenopathy, abnormal bleeding Allergy/immunology: No itchy/watery eyes, significant sneezing, urticaria, angioedema  Physical exam:  Pertinent or positive findings: There is somewhat of a prominent "staring" countenance;but there is no lid lag.  Eyebrows density is decreased laterally.  There is insignificant hirsutism at the chin.  I cannot appreciate any nodules on palpation of the thyroid.  First and second heart sounds are accentuated.  Breath sounds are decreased with minor low-grade rales at the base.  There is 1/2+ edema at the sock line.  Dorsalis pedis pulses are stronger than posterior tibial pulses.  The lower extremities seem stronger  than the upper extremities to opposition.  When asked to sit up she turned to the lateral decubitus position but could not rise without help. She has hyperpigmentation horizontally over the bridge of the nose.  There is also similar hyperpigmentation of the left inferior nasolabial fold area and around her eyes and above the eyebrows medially.  She has hyperpigmentation at the elbows without classic psoriatic changes.  She has hyperpigmentation over some of the PIP and MCP joints.  There is also hyperpigmentation at the base of some of the nails.  She has small circular hyperpigmented lesions over the left shin with some hyperpigmented minor scarring of the right shin.  The lumbosacral area reveals no skin lesions or pigment change.  The hands do not exhibit classic rheumatoid arthritic or psoriatic arthritic changes.  Foot drop is present on the right.  General appearance: no acute distress, increased work of breathing is present.   Lymphatic: No lymphadenopathy about the head, neck, axilla. Eyes: No conjunctival inflammation or lid edema is present. There is no scleral icterus. Ears:  External ear exam shows no significant lesions or deformities.   Nose:  External nasal examination shows no deformity or inflammation. Nasal mucosa are pink and moist without lesions, exudates Oral exam: Lips and gums are healthy appearing.There is no oropharyngeal erythema or exudate. Neck:  No thyromegaly, masses, tenderness noted.    Heart:  Normal rate and regular rhythm normal without gallop, murmur, click, rub.  Lungs:  without wheezes, rhonchi,  rubs. Abdomen: Bowel sounds are normal.  Abdomen is soft and nontender with no organomegaly, hernias, masses. GU: Deferred  Extremities:  No cyanosis, clubbing. Neurologic exam:  Balance, Rhomberg, finger to nose testing could not be  completed due to clinical state Skin: Warm & dry w/o tenting. No significant rash.  See clinical summary under each active problem in  the Problem List with associated updated therapeutic plan

## 2021-05-14 NOTE — Patient Instructions (Signed)
See assessment and plan under each diagnosis in the problem list and acutely for this visit 

## 2021-05-14 NOTE — Assessment & Plan Note (Addendum)
Current A1c of 6.7% indicates excellent control.  No change indicated in present regimen unless hypoglycemia is documented. The right foot symptoms may reflect diabetic neuropathy; but the picture is atypical.

## 2021-05-15 DIAGNOSIS — N179 Acute kidney failure, unspecified: Secondary | ICD-10-CM | POA: Insufficient documentation

## 2021-05-15 NOTE — Assessment & Plan Note (Addendum)
Endocrinology referral and thyroid ultrasound indicated. ?The low normal free T4 and free T3 and suppressed TSH may suggest autonomous function of the thyroid nodule.  The low TSH may decrease risk for malignant etiology according the medical literature. ?

## 2021-05-15 NOTE — Assessment & Plan Note (Signed)
Sepsis picture resolved.  She is afebrile and exhibits no significant infectious symptoms or signs. ?

## 2021-05-15 NOTE — Assessment & Plan Note (Addendum)
Most current albumin 1.9 and total protein 5.  Lower extremities clinically are stronger than the upper extremities to opposition.  She is unable to rise up in bed to sit without help. ?By history the weakness is not a complication of her prior stroke.  PT/OT, and Nutrition consult at SNF. ? ?

## 2021-05-15 NOTE — Assessment & Plan Note (Signed)
No indication for medication change unless there is progression in her CKD. ?

## 2021-05-15 NOTE — Assessment & Plan Note (Addendum)
BIMS (Brief Interview for Mental Status) score varies from 00-15 ,15 being normal.  Her score was 10 suggesting moderate neurocognitive impairment. ?Official MMSE testing (SLUMS) indicated if there is not continued improvement in her mental status. ?

## 2021-05-15 NOTE — Assessment & Plan Note (Signed)
I have recommended follow-up with Dr. Trudie Reed as soon as possible following discharge from SNF ?

## 2021-05-16 LAB — CBC AND DIFFERENTIAL
HCT: 40 (ref 36–46)
Hemoglobin: 12 (ref 12.0–16.0)
Platelets: 334 (ref 150–399)
WBC: 4

## 2021-05-16 LAB — BASIC METABOLIC PANEL
BUN: 12 (ref 4–21)
CO2: 23 — AB (ref 13–22)
Chloride: 111 — AB (ref 99–108)
Creatinine: 0.5 (ref 0.5–1.1)
Glucose: 81
Potassium: 3.8 (ref 3.4–5.3)
Sodium: 146 (ref 137–147)

## 2021-05-16 LAB — HEPATIC FUNCTION PANEL
ALT: 11 (ref 7–35)
AST: 22 (ref 13–35)
Alkaline Phosphatase: 98 (ref 25–125)
Bilirubin, Total: 0.5

## 2021-05-16 LAB — CBC: RBC: 4.28 (ref 3.87–5.11)

## 2021-05-16 LAB — COMPREHENSIVE METABOLIC PANEL
Albumin: 3.1 — AB (ref 3.5–5.0)
Calcium: 9.9 (ref 8.7–10.7)
Globulin: 3.6

## 2021-05-20 ENCOUNTER — Encounter: Payer: Self-pay | Admitting: Adult Health

## 2021-05-20 ENCOUNTER — Non-Acute Institutional Stay (SKILLED_NURSING_FACILITY): Payer: Medicare HMO | Admitting: Adult Health

## 2021-05-20 DIAGNOSIS — I2510 Atherosclerotic heart disease of native coronary artery without angina pectoris: Secondary | ICD-10-CM

## 2021-05-20 DIAGNOSIS — Z794 Long term (current) use of insulin: Secondary | ICD-10-CM | POA: Diagnosis not present

## 2021-05-20 DIAGNOSIS — A419 Sepsis, unspecified organism: Secondary | ICD-10-CM

## 2021-05-20 DIAGNOSIS — E1165 Type 2 diabetes mellitus with hyperglycemia: Secondary | ICD-10-CM

## 2021-05-20 DIAGNOSIS — I10 Essential (primary) hypertension: Secondary | ICD-10-CM | POA: Diagnosis not present

## 2021-05-20 DIAGNOSIS — R6521 Severe sepsis with septic shock: Secondary | ICD-10-CM

## 2021-05-20 DIAGNOSIS — M6281 Muscle weakness (generalized): Secondary | ICD-10-CM | POA: Diagnosis not present

## 2021-05-20 DIAGNOSIS — G9340 Encephalopathy, unspecified: Secondary | ICD-10-CM | POA: Diagnosis not present

## 2021-05-20 DIAGNOSIS — E871 Hypo-osmolality and hyponatremia: Secondary | ICD-10-CM | POA: Diagnosis not present

## 2021-05-20 DIAGNOSIS — E440 Moderate protein-calorie malnutrition: Secondary | ICD-10-CM

## 2021-05-20 DIAGNOSIS — R2681 Unsteadiness on feet: Secondary | ICD-10-CM | POA: Diagnosis not present

## 2021-05-20 NOTE — Progress Notes (Signed)
Location:  [facility] Room Number: 216-A Place of Service:  SNF (31) Provider:  Kenard Gower, DNP, FNP-BC  Patient Care Team: [name] Ishihara, MD as PCP - General (Internal Medicine) [place], Peter M, MD as PCP - Cardiology (Cardiology)  Extended Emergency Contact Information Primary Emergency Contact: [name] [name],[name] [name] Address: [address]          [place], [place] [address] D[place] of [place] Phone: [phone] Relation: Son Secondary Emergency Contact: [name] [name],[name] [name] Mobile Phone: [phone] Relation: Son  Code Status:  Full Code  Goals of care: Advanced Directive information Advanced Directives 05/20/2021  Does Patient Have a Medical Advance Directive? Yes  Type of Advance Directive Out of facility DNR (pink MOST or yellow form)  Does patient want to make changes to medical advance directive? No - Patient declined  Would patient like information on creating a medical advance directive? -  Pre-existing out of facility DNR order (yellow form or pink MOST form) Pink MOST form placed in chart (order not valid for inpatient use)     Chief Complaint  Patient presents with   Acute Visit    Short term rehabilitation     HPI:  [name] seen today for an acute visit. She has a PMH of Psoriatic arthritis, diabetes mellitus type 2, hypertension and CAD s/p CABG.  Type 2 diabetes mellitus with hyperglycemia, with long-term current use of insulin (HCC)  -   CBGs ranging from 94 to 160, with outlier 68. She takes metformin 1000 mg twice a day, Victoza 1.8 mg injection daily at bedtime and insulin aspart 5 units subcutaneously daily before each meal for CBG >= 300         Malnutrition of moderate degree   -   albumin 3.1, up from 1.9 (05/04/2021)  Essential hypertension  -  SBPs ranging from 100-150, takes amlodipine 5 mg 1 tab daily  Coronary artery disease involving native coronary artery of native heart, unspecified  whether angina present -denies chest pain, takes NTG as needed and aspirin EC 81 mg 1 tab daily  Sepsis with encephalopathy and septic shock, due to unspecified organism [facility]) -   continues to take Augmentin 875-125 mg 1 capsule twice a day. S/P thoracentesis on 2/15, pleural AFB culture was negative    Past Medical History:  Diagnosis Date   Acute ST elevation myocardial infarction (STEMI) involving right coronary artery (HCC) 10/04/2015   x 2   Anxiety    Anxiety and depression    CAD (coronary artery disease)    a. CABG 06/2015: LIMA-LAD, SVG-RCA, SVG-OM2 b. STEMI s/p PCI to distal RCA lesion with a small Promus Premier stent. ( patent LIMA to the LAD, occluded SVG to OM, occluded SVG to RCA.)   CAD (coronary artery disease) of artery bypass graft; occluded SVG to RCA and occluded SVG to OM 10/08/2015   Empty sella (HCC)    GERD (gastroesophageal reflux disease)    Headache    from stress   Hematuria wed 06-13-2020   Hidradenitis axillaris    History of kidney stones    Hyperlipemia    Hypertension    Lupus (HCC) dx'd 2008   "they think I have this; have to get retested" (06/12/2015)   Obesity (BMI 30.0-34.9)    Pneumonia X 2   last time yrs ago   PONV (postoperative nausea and vomiting)    likes iv med or scopolamine patch works well   Psoriasis 06/15/2020   hands  elbows, ears, small amount on scalp and bridge of nose   Rheumatoid aortitis    psorriatic arthritis   Rheumatoid arthritis (HCC)    S/P angioplasty with stent; 10/04/15 to distal RCA lesion. with Promus premier. 10/08/2015   Scoliosis    Sleep apnea    patient states "it is mild" no cpap needed   Stroke [facility]) March 2014   left MCA branches; "they say I have them all the time", denies residual on 06/12/2015   Tobacco abuse    Type II diabetes mellitus (HCC)    Urinary frequency    and urgency   Wears glasses    for reading   Past Surgical History:  Procedure Laterality Date   ABDOMINAL HYSTERECTOMY  31 yrs  ago   total has ovaies   BREAST BIOPSY Bilateral yrd ago   benign   CARDIAC CATHETERIZATION N/A 06/13/2015   Procedure: Left Heart Cath and Coronary Angiography;  Surgeon: Peter [name] [place], MD;  Location: [facility] INVASIVE CV LAB;  Service: Cardiovascular;  Laterality: N/A;   CARDIAC CATHETERIZATION N/A 10/04/2015   Procedure: Left Heart Cath and Coronary Angiography;  Surgeon: Corky Crafts, MD;  Location: [facility] INVASIVE CV LAB;  Service: Cardiovascular;  Laterality: N/A;   CARDIAC CATHETERIZATION N/A 10/04/2015   Procedure: Coronary Stent Intervention;  Surgeon: Corky Crafts, MD;  Location: [facility] INVASIVE CV LAB;  Service: Cardiovascular;  Laterality: N/A;   CORONARY ARTERY BYPASS GRAFT N/A 06/19/2015   Procedure: CORONARY ARTERY BYPASS GRAFTING (CABG) TIMES FOUR USING LEFT INTERNAL MAMMARY, RIGHT SAPHENOUS LEG VEIN AND CRYO SAPHENOUS VEIN;  Surgeon: Kerin Perna, MD;  Location: [facility] OR;  Service: Open Heart Surgery;  Laterality: N/A;   CYSTOSCOPY WITH RETROGRADE PYELOGRAM, URETEROSCOPY AND STENT PLACEMENT Left 06/21/2020   Procedure: CYSTOSCOPY WITH RETROGRADE PYELOGRAM, URETEROSCOPY, BASKET STONE EXTRACTION AND  STENT PLACEMENT;  Surgeon: Noel Christmas, MD;  Location: [facility];  Service: Urology;  Laterality: Left;   DILATION AND CURETTAGE OF UTERUS  yrs ago   HOLMIUM LASER APPLICATION Left 06/21/2020   Procedure: HOLMIUM LASER APPLICATION;  Surgeon: Noel Christmas, MD;  Location: [facility];  Service: Urology;  Laterality: Left;   HYDRADENITIS EXCISION Right 12/20/2019   Procedure: EXCISION OF RIGHT  HIDRADENITIS AXILLARY;  Surgeon: Manus Rudd, MD;  Location: [facility] OR;  Service: General;  Laterality: Right;   IR URETERAL STENT LEFT NEW ACCESS W/O SEP NEPHROSTOMY CATH  01/10/2019   NEPHROLITHOTOMY Left 01/10/2019   Procedure: NEPHROLITHOTOMY PERCUTANEOUS;  Surgeon: Ihor Gully, MD;  Location: [facility] ORS;  Service: Urology;  Laterality: Left;   TEE WITHOUT  CARDIOVERSION N/A 06/19/2015   Procedure: TRANSESOPHAGEAL ECHOCARDIOGRAM (TEE);  Surgeon: Kerin Perna, MD;  Location: [facility] OR;  Service: Open Heart Surgery;  Laterality: N/A;   TONSILLECTOMY  age 42   TUBAL LIGATION  many yrs ago    Allergies  Allergen Reactions   Apremilast Other (See Comments)    unknown   Latex Itching, Swelling and Rash    Outpatient Encounter Medications as of 05/20/2021  Medication Sig   acetaminophen (TYLENOL) 325 MG tablet Take 650 mg by mouth every 8 (eight) hours as needed for moderate pain or headache. Give 2 tablets = 1300mg  po qAM   Amino Acids-Protein Hydrolys (FEEDING SUPPLEMENT, PRO-STAT SUGAR FREE 64,) LIQD Take 30 mLs by mouth in the morning and at bedtime.   amLODipine (NORVASC) 5 MG tablet Take 1 tablet (5 mg total) by mouth daily.   amoxicillin-clavulanate (AUGMENTIN)  875-125 MG tablet Take 1 tablet by mouth every 12 (twelve) hours for 20 days.   aspirin EC 81 MG EC tablet Take 1 tablet (81 mg total) by mouth daily.   atorvastatin (LIPITOR) 80 MG tablet TAKE 1 TABLET EVERY DAY  AT  6PM   B Complex-C (B-COMPLEX WITH VITAMIN C) tablet Place 1 tablet into feeding tube daily.   bisacodyl (DULCOLAX) 10 MG suppository If not relieved by MOM, give 10 mg Bisacodyl suppositiory rectally X 1 dose in 24 hours as needed   cholecalciferol (VITAMIN D) 25 MCG (1000 UNIT) tablet Take 1,000 Units by mouth in the morning and at bedtime.   escitalopram (LEXAPRO) 10 MG tablet Take 10 mg by mouth daily.   Evolocumab with Infusor (REPATHA PUSHTRONEX SYSTEM) 420 MG/3.5ML SOCT Inject 420 mg into the skin every 30 (thirty) days.   feeding supplement (ENSURE ENLIVE / ENSURE PLUS) LIQD Take 237 mLs by mouth 2 (two) times daily between meals.   gabapentin (NEURONTIN) 100 MG capsule Take 100 mg by mouth 2 (two) times daily.   guaiFENesin (MUCINEX) 600 MG 12 hr tablet Take 600 mg by mouth 2 (two) times daily as needed (congestion.).   Insulin Aspart FlexPen (NOVOLOG) 100 UNIT/ML  INJECT 5 UNITS SUBCUTANEOUSLY DAILY BEFORE EACH MEAL FOR CBG >/=300 (PRIME PEN W/2 UNITS PRIOR TO EACH USE)   liraglutide (VICTOZA) 18 MG/3ML SOPN Inject 1.8 mg into the skin at bedtime.    Magnesium Hydroxide (MILK OF MAGNESIA PO) If no BM in 3 days, give 30 cc Milk of Magnesium p.o. x 1 dose in 24 hours as needed (Do not use standing constipation orders for residents with renal failure CFR less than 30. Contact MD for orders)   metFORMIN (GLUCOPHAGE) 1000 MG tablet Take 1,000 mg by mouth 2 (two) times daily.   nitroGLYCERIN (NITROSTAT) 0.4 MG SL tablet DISSOLVE 1 TABLET UNDER THE TONGUE EVERY 5 MINUTES AS  NEEDED FOR CHEST PAIN. MAX  OF 3 TABLETS IN 15 MINUTES. CALL 911 IF PAIN PERSISTS.   ondansetron (ZOFRAN-ODT) 4 MG disintegrating tablet Take 4 mg by mouth every 8 (eight) hours as needed for nausea/vomiting.   pantoprazole (PROTONIX) 40 MG tablet Take 1 tablet (40 mg total) by mouth daily.   thiamine 100 MG tablet Take 2.5 tablets (250 mg total) by mouth daily.   traMADol (ULTRAM) 50 MG tablet Take 1 tablet (50 mg total) by mouth every 8 (eight) hours as needed.   Turmeric (QC TUMERIC COMPLEX PO) Take 1 capsule by mouth daily.   sucralfate (CARAFATE) 1 g tablet Take 1 tablet (1 g total) by mouth 4 (four) times daily as needed. (Patient taking differently: Take 1 g by mouth 4 (four) times daily as needed (stomach pain).)   No facility-administered encounter medications on file as of 05/20/2021.    Review of Systems  Constitutional:  Negative for appetite change, chills, fatigue and fever.  HENT:  Negative for congestion, hearing loss, rhinorrhea and sore throat.   Eyes: Negative.   Respiratory:  Negative for cough, shortness of breath and wheezing.   Cardiovascular:  Negative for chest pain, palpitations and leg swelling.  Gastrointestinal:  Negative for abdominal pain, constipation, diarrhea, nausea and vomiting.  Genitourinary:  Negative for dysuria.  Skin:  Negative for color change, rash  and wound.  Neurological:  Negative for dizziness, weakness and headaches.  Psychiatric/Behavioral:  Negative for behavioral problems. The patient is not nervous/anxious.       Immunization History  Administered Date(s) Administered  Influenza-Unspecified 11/15/2013, 04/04/2015   Pneumococcal Polysaccharide-23 10/27/2013, 05/06/2021   Tdap 02/08/2016   Pertinent  Health Maintenance Due  Topic Date Due   OPHTHALMOLOGY EXAM  Never done   URINE MICROALBUMIN  Never done   PAP SMEAR-Modifier  Never done   COLONOSCOPY (Pts 45-4yrs Insurance coverage will need to be confirmed)  Never done   HEMOGLOBIN A1C  10/28/2021   FOOT EXAM  12/25/2021   MAMMOGRAM  03/06/2023   INFLUENZA VACCINE  Completed   Fall Risk 05/07/2021 05/08/2021 05/08/2021 05/08/2021 05/09/2021  Falls in the past year? - - - - -  Was there an injury with Fall? - - - - -  Fall Risk Category Calculator - - - - -  Fall Risk Category - - - - -  Patient Fall Risk Level High fall risk High fall risk High fall risk High fall risk High fall risk  Patient at Risk for Falls Due to - - - - -  Patient at Risk for Falls Due to - - - - -     Vitals:   05/20/21 1105  BP: 133/78  Pulse: 98  Resp: 19  Temp: (!) 97.5 F (36.4 C)  SpO2: 98%  Weight: 149 lb 1.6 oz (67.6 kg)  Height: 5\' 6"  (1.676 [name])   Body mass index is 24.07 kg/[name].  Physical Exam Constitutional:      Appearance: Normal appearance.  HENT:     Head: Normocephalic and atraumatic.     Nose: Nose normal.     Mouth/Throat:     Mouth: Mucous membranes are moist.  Eyes:     Conjunctiva/sclera: Conjunctivae normal.  Cardiovascular:     Rate and Rhythm: Normal rate and regular rhythm.  Pulmonary:     Effort: Pulmonary effort is normal.     Breath sounds: Normal breath sounds.  Abdominal:     General: Bowel sounds are normal.     Palpations: Abdomen is soft.  Musculoskeletal:        General: Normal range of motion.     Cervical back: Normal range of motion.   Skin:    General: Skin is warm and dry.     Comments: Fingernails and toenails with black pigmentations.   Neurological:     General: No focal deficit present.     Mental Status: She is alert and oriented to person, place, and time.  Psychiatric:        Mood and Affect: Mood normal.        Behavior: Behavior normal.        Thought Content: Thought content normal.        Judgment: Judgment normal.     Labs reviewed: Recent Labs    05/04/21 0323 05/05/21 0142 05/05/21 0741 05/07/21 0118 05/08/21 0316 05/16/21 0000  NA 140 138  --  141 143 146  K 3.4* 3.3*  --  3.4* 4.2 3.8  CL 106 104  --  104 109 111*  CO2 25 26  --  29 26 23*  GLUCOSE 268* 202*  --  82 65*  --   BUN 8 5*  --  <5* 5* 12  CREATININE 0.56 0.45  --  0.54 0.60 0.5  CALCIUM 7.5* 7.8*  --  8.8* 8.8* 9.9  MG 2.0 1.7  --   --  2.1  --   PHOS 1.1* <1.0* 2.0* 3.2 3.0  --    Recent Labs    04/17/21 1425 04/30/21 1834 05/01/21 1633 05/04/21 0323  05/16/21 0000  AST 28 35  --  102* 22  ALT 13 15  --  57* 11  ALKPHOS 60 49  --  75 98  BILITOT 1.2 1.2  --  0.9  --   PROT 7.4 8.2* 6.7 5.0*  --   ALBUMIN 2.7* 3.1*  --  1.9* 3.1*   Recent Labs    04/30/21 1834 04/30/21 2130 05/02/21 0248 05/03/21 0245 05/04/21 0323 05/05/21 0142 05/07/21 0118 05/09/21 0407 05/16/21 0000  WBC 9.7   < > 6.9 5.0   < > 7.1 4.4 8.4 4.0  NEUTROABS 8.4*  --  5.8 3.3  --   --   --   --   --   HGB 16.4*   < > 12.2 12.7   < > 11.4* 11.7* 10.6* 12.0  HCT 53.1*   < > 39.3 40.5   < > 36.6 36.2 33.1* 40  MCV 95.5   < > 94.2 91.6   < > 91.7 90.0 91.7  --   PLT 201   < > 91* 77*   < > 80* 127* 241 334   < > = values in this interval not displayed.   Lab Results  Component Value Date   TSH 0.092 (L) 04/30/2021   Lab Results  Component Value Date   HGBA1C 6.7 (H) 04/30/2021   Lab Results  Component Value Date   CHOL 128 10/03/2019   HDL 63 10/03/2019   LDLCALC 32 10/03/2019   TRIG 211 (H) 10/03/2019   CHOLHDL 2.0  10/03/2019    Significant Diagnostic Results in last 30 days:  DG Chest 2 View  Result Date: 04/23/2021 CLINICAL DATA:  Pleural effusion. EXAM: CHEST - 2 VIEW COMPARISON:  April 06, 2021. FINDINGS: Stable cardiomediastinal silhouette. Status post coronary bypass graft. Small right pleural effusion is noted. Right basilar atelectasis or infiltrate is noted. Left lung is clear. Bony thorax is unremarkable. IMPRESSION: Right basilar atelectasis or infiltrate is noted with small right pleural effusion. Electronically Signed   By: Lupita Raider [name].D.   On: 04/23/2021 11:09   CT Head Wo Contrast  Result Date: 04/30/2021 CLINICAL DATA:  Mental status change, unknown cause EXAM: CT HEAD WITHOUT CONTRAST TECHNIQUE: Contiguous axial images were obtained from the base of the skull through the vertex without intravenous contrast. RADIATION DOSE REDUCTION: This exam was performed according to the departmental dose-optimization program which includes automated exposure control, adjustment of the mA and/or kV according to patient size and/or use of iterative reconstruction technique. COMPARISON:  04/25/2021 FINDINGS: Brain: There is atrophy and chronic small vessel disease changes. No acute intracranial abnormality. Specifically, no hemorrhage, hydrocephalus, mass lesion, acute infarction, or significant intracranial injury. Vascular: No hyperdense vessel or unexpected calcification. Skull: No acute calvarial abnormality. Sinuses/Orbits: No acute findings Other: None IMPRESSION: Atrophy, chronic microvascular disease. No acute intracranial abnormality. Electronically Signed   By: Charlett Nose [name].D.   On: 04/30/2021 22:16   CT Head Wo Contrast  Result Date: 04/25/2021 CLINICAL DATA:  Head and neck trauma.  Focal neural findings EXAM: CT HEAD WITHOUT CONTRAST CT CERVICAL SPINE WITHOUT CONTRAST TECHNIQUE: Multidetector CT imaging of the head and cervical spine was performed following the standard protocol without  intravenous contrast. Multiplanar CT image reconstructions of the cervical spine were also generated. RADIATION DOSE REDUCTION: This exam was performed according to the departmental dose-optimization program which includes automated exposure control, adjustment of the mA and/or kV according to patient size and/or use of iterative reconstruction  technique. COMPARISON:  MRI head 04/18/2021 FINDINGS: CT HEAD FINDINGS Brain: Ventricle size and cerebral volume within normal limits. Mild hypodensity in the white matter. Small hypodensity left post subinsular white matter unchanged from MRI and consistent with chronic ischemia Negative for acute infarct, hemorrhage, mass Vascular: Negative for hyperdense vessel Skull: Negative Sinuses/Orbits: Mild mucosal edema paranasal sinuses. Negative orbit Other: None CT CERVICAL SPINE FINDINGS Alignment: Normal Skull base and vertebrae: Negative for acute fracture. Chronic healed fracture left first rib. Soft tissues and spinal canal: Negative for soft tissue mass or adenopathy. Mild thyromegaly. Disc levels: Mild disc degeneration and mild spurring C3-4 and C4-5. Negative for spinal or foraminal stenosis. Degenerative changes C1-2. Upper chest: Small right pleural effusion Other: None IMPRESSION: 1. No acute intracranial abnormality 2. Negative for cervical spine fracture. Electronically Signed   By: Marlan Palau [name].D.   On: 04/25/2021 12:10   CT Cervical Spine Wo Contrast  Result Date: 04/25/2021 CLINICAL DATA:  Head and neck trauma.  Focal neural findings EXAM: CT HEAD WITHOUT CONTRAST CT CERVICAL SPINE WITHOUT CONTRAST TECHNIQUE: Multidetector CT imaging of the head and cervical spine was performed following the standard protocol without intravenous contrast. Multiplanar CT image reconstructions of the cervical spine were also generated. RADIATION DOSE REDUCTION: This exam was performed according to the departmental dose-optimization program which includes automated exposure  control, adjustment of the mA and/or kV according to patient size and/or use of iterative reconstruction technique. COMPARISON:  MRI head 04/18/2021 FINDINGS: CT HEAD FINDINGS Brain: Ventricle size and cerebral volume within normal limits. Mild hypodensity in the white matter. Small hypodensity left post subinsular white matter unchanged from MRI and consistent with chronic ischemia Negative for acute infarct, hemorrhage, mass Vascular: Negative for hyperdense vessel Skull: Negative Sinuses/Orbits: Mild mucosal edema paranasal sinuses. Negative orbit Other: None CT CERVICAL SPINE FINDINGS Alignment: Normal Skull base and vertebrae: Negative for acute fracture. Chronic healed fracture left first rib. Soft tissues and spinal canal: Negative for soft tissue mass or adenopathy. Mild thyromegaly. Disc levels: Mild disc degeneration and mild spurring C3-4 and C4-5. Negative for spinal or foraminal stenosis. Degenerative changes C1-2. Upper chest: Small right pleural effusion Other: None IMPRESSION: 1. No acute intracranial abnormality 2. Negative for cervical spine fracture. Electronically Signed   By: Marlan Palau [name].D.   On: 04/25/2021 12:10   US RENAL  Result Date: 05/01/2021 CLINICAL DATA:  Acute kidney injury EXAM: RENAL / URINARY TRACT ULTRASOUND COMPLETE COMPARISON:  CT 04/02/2021 FINDINGS: Right Kidney: Renal measurements: 11.5 x 5.0 x 5.8 cm = volume: 173.8 mL. Mildly increased renal cortical echogenicity. No hydronephrosis. Left Kidney: Renal measurements: 10.0 x 5.5 x 3.45 cm = volume: 96.3 mL. Mildly increased renal cortical echogenicity. No hydronephrosis. Bladder: Mildly distended.  Foley catheter in place. Other: None. IMPRESSION: No hydronephrosis. Mildly increased renal cortical echogenicity as can be seen in medical renal disease. Electronically Signed   By: Caprice Renshaw [name].D.   On: 05/01/2021 16:14   DG CHEST PORT 1 VIEW  Result Date: 05/03/2021 CLINICAL DATA:  Right pleural effusion. EXAM:  PORTABLE CHEST 1 VIEW COMPARISON:  May 02, 2021. FINDINGS: Stable cardiomediastinal silhouette. Status post coronary bypass graft. Feeding tube is seen entering stomach. Mild right basilar atelectasis or infiltrate is noted with small right pleural effusion. Minimal left basilar subsegmental atelectasis is noted. Bony thorax is unremarkable. IMPRESSION: Mild right basilar atelectasis or infiltrate is noted with small right pleural effusion. Electronically Signed   By: Lupita Raider [name].D.   On:  05/03/2021 06:52   DG CHEST PORT 1 VIEW  Result Date: 05/02/2021 CLINICAL DATA:  Pleural effusion EXAM: PORTABLE CHEST 1 VIEW COMPARISON:  Chest radiograph 1 day prior FINDINGS: The enteric catheter courses below the diaphragm and off the field of view. Median sternotomy wires and mediastinal surgical clips are stable. The cardiomediastinal silhouette is stable. There is a trace right pleural effusion, not significantly changed. There is no significant left effusion. There is no overt pulmonary edema. There is no new or worsening focal airspace disease. There is no pneumothorax. The bones are stable. IMPRESSION: Trace right pleural effusion, not significantly changed. Overall, no significant interval change with no new or worsening focal airspace disease. Electronically Signed   By: Lesia Hausen [name].D.   On: 05/02/2021 08:14   DG CHEST PORT 1 VIEW  Result Date: 05/01/2021 CLINICAL DATA:  Status post right sided thoracentesis. EXAM: PORTABLE CHEST 1 VIEW COMPARISON:  Chest radiograph dated April 30, 2021 FINDINGS: The heart is enlarged. Evidence of prior CABG. Atherosclerotic calcification of the aortic arch. Interval decrease in the right pleural effusion. Right basilar atelectasis and trace effusion. Left lung is clear. No appreciable pneumothorax. Interval placement of the feeding tube with distal tip below the diaphragm but not included on this image. IMPRESSION: 1. Status post thoracentesis with interval  decrease in right pleural effusion. Minimal right basilar atelectasis. No appreciable pneumothorax. 2. Interval placement of feeding tube with distal tip below the diaphragm, but not included on this image. 3.  No other significant interval change. Electronically Signed   By: Larose Hires D.O.   On: 05/01/2021 16:39   DG Chest Port 1 View  Result Date: 04/30/2021 CLINICAL DATA:  Altered mental status/sepsis EXAM: PORTABLE CHEST 1 VIEW COMPARISON:  Chest x-ray 04/23/2021, chest x-ray 03/02/2017 FINDINGS: Enlarged cardiac silhouette. The heart and mediastinal contours are unchanged. Aortic calcification. No focal consolidation. No pulmonary edema. Small volume right pleural effusion. No pneumothorax. No acute osseous abnormality. Cardiac surgical hardware overlies the mediastinum. Possible right shoulder calcific tendinosis. IMPRESSION: 1. Small volume right pleural effusion. 2.  Aortic Atherosclerosis (ICD10-I70.0). Electronically Signed   By: Tish Frederickson [name].D.   On: 04/30/2021 19:48   DG Abd Portable 1V  Result Date: 05/06/2021 CLINICAL DATA:  Nasogastric tube placement. EXAM: PORTABLE ABDOMEN - 1 VIEW COMPARISON:  05/01/2021. FINDINGS: Interval removal of the feeding tube and placement of a nasogastric tube with the side port several cm beyond the gastroesophageal junction. Gas is seen in nondilated small bowel and colon. Degenerative changes in the spine and hips. IMPRESSION: Nasogastric tube terminates in the stomach. Electronically Signed   By: Leanna Battles [name].D.   On: 05/06/2021 08:19   DG Abd Portable 1V  Result Date: 05/01/2021 CLINICAL DATA:  NG tube placement EXAM: PORTABLE ABDOMEN - 1 VIEW COMPARISON:  07/05/2020 FINDINGS: Bowel gas pattern is nonspecific. No abnormal masses or calcifications are seen. Pelvis is not included in its entirety. Tip of enteric tube is seen in the antrum of the stomach. Metallic sutures are seen in the sternum. Heart is enlarged in size. IMPRESSION: Tip of  enteric tube is seen in the antrum of the stomach. Bowel gas pattern is nonspecific. Electronically Signed   By: Ernie Avena [name].D.   On: 05/01/2021 14:51   ECHOCARDIOGRAM COMPLETE  Result Date: 05/01/2021    ECHOCARDIOGRAM REPORT   Patient Name:   [name] [name] Date of Exam: 05/01/2021 Medical Rec #:  [id number]       Height:  66.0 in Accession #:    [id number]      Weight:       171.5 lb Date of Birth:  1960/09/05       BSA:          1.873 [name] Patient Age:    60 years        BP:           150/115 mmHg Patient Gender: F               HR:           101 bpm. Exam Location:  Inpatient Procedure: 2D Echo, Cardiac Doppler, Color Doppler and Intracardiac            Opacification Agent Indications:    CHF  History:        Patient has prior history of Echocardiogram examinations, most                 recent 03/03/2017. CAD and Previous Myocardial Infarction,                 Stroke; Risk Factors:Hypertension, Dyslipidemia and Diabetes.  Sonographer:    Neomia Dear RDCS Referring Phys: [id number] LAURA R GLEASON IMPRESSIONS  1. Left ventricular ejection fraction, by estimation, is 45%. The left ventricle has mildly decreased function. The left ventricle demonstrates regional wall motion abnormalities with basal to mid inferior and basal to mid inferolateral akinesis suggestive of prior MI. There is moderate left ventricular hypertrophy. Left ventricular diastolic parameters are consistent with Grade I diastolic dysfunction (impaired relaxation).  2. Right ventricular systolic function is normal. The right ventricular size is normal. There is normal pulmonary artery systolic pressure. The estimated right ventricular systolic pressure is 35.5 mmHg.  3. The mitral valve is normal in structure. No evidence of mitral valve regurgitation. No evidence of mitral stenosis.  4. The aortic valve is tricuspid. Aortic valve regurgitation is not visualized. Aortic valve sclerosis/calcification is present, without any evidence  of aortic stenosis.  5. The inferior vena cava is normal in size with greater than 50% respiratory variability, suggesting right atrial pressure of 3 mmHg. FINDINGS  Left Ventricle: Left ventricular ejection fraction, by estimation, is 45%. The left ventricle has mildly decreased function. The left ventricle demonstrates regional wall motion abnormalities. Definity contrast agent was given IV to delineate the left ventricular endocardial borders. The left ventricular internal cavity size was normal in size. There is moderate left ventricular hypertrophy. Left ventricular diastolic parameters are consistent with Grade I diastolic dysfunction (impaired relaxation). Right Ventricle: The right ventricular size is normal. No increase in right ventricular wall thickness. Right ventricular systolic function is normal. There is normal pulmonary artery systolic pressure. The tricuspid regurgitant velocity is 2.85 [name]/s, and  with an assumed right atrial pressure of 3 mmHg, the estimated right ventricular systolic pressure is 35.5 mmHg. Left Atrium: Left atrial size was normal in size. Right Atrium: Right atrial size was normal in size. Pericardium: There is no evidence of pericardial effusion. Mitral Valve: The mitral valve is normal in structure. No evidence of mitral valve regurgitation. No evidence of mitral valve stenosis. MV peak gradient, 12.8 mmHg. The mean mitral valve gradient is 5.0 mmHg. Tricuspid Valve: The tricuspid valve is normal in structure. Tricuspid valve regurgitation is trivial. Aortic Valve: The aortic valve is tricuspid. Aortic valve regurgitation is not visualized. Aortic valve sclerosis/calcification is present, without any evidence of aortic stenosis. Aortic valve mean gradient measures 5.0 mmHg. Aortic valve peak gradient  measures 8.9 mmHg. Aortic valve area, by VTI measures 3.28 cm. Pulmonic Valve: The pulmonic valve was normal in structure. Pulmonic valve regurgitation is not visualized. Aorta:  The aortic root is normal in size and structure. Venous: The inferior vena cava is normal in size with greater than 50% respiratory variability, suggesting right atrial pressure of 3 mmHg. IAS/Shunts: No atrial level shunt detected by color flow Doppler.  LEFT VENTRICLE PLAX 2D LVIDd:         3.60 cm     Diastology LVIDs:         2.80 cm     LV e' medial:    5.66 cm/s LV PW:         1.30 cm     LV E/e' medial:  15.8 LV IVS:        1.40 cm     LV e' lateral:   5.11 cm/s LVOT diam:     2.20 cm     LV E/e' lateral: 17.5 LV SV:         73 LV SV Index:   39 LVOT Area:     3.80 cm  LV Volumes (MOD) LV vol d, MOD A2C: 52.3 ml LV vol d, MOD A4C: 65.2 ml LV vol s, MOD A2C: 50.6 ml LV vol s, MOD A4C: 41.5 ml LV SV MOD A2C:     1.7 ml LV SV MOD A4C:     65.2 ml LV SV MOD BP:      12.2 ml RIGHT VENTRICLE RV Basal diam:  3.30 cm RV Mid diam:    2.30 cm LEFT ATRIUM             Index        RIGHT ATRIUM           Index LA Vol (A2C):   51.5 ml 27.49 ml/[name]  RA Area:     13.80 cm LA Vol (A4C):   44.3 ml 23.65 ml/[name]  RA Volume:   33.80 ml  18.04 ml/[name] LA Biplane Vol: 47.5 ml 25.35 ml/[name]  AORTIC VALVE                    PULMONIC VALVE AV Area (Vmax):    3.10 cm     PV Vmax:       1.44 [name]/s AV Area (Vmean):   3.13 cm     PV Vmean:      91.900 cm/s AV Area (VTI):     3.28 cm     PV VTI:        0.204 [name] AV Vmax:           149.50 cm/s  PV Peak grad:  8.3 mmHg AV Vmean:          98.650 cm/s  PV Mean grad:  4.5 mmHg AV VTI:            0.222 [name] AV Peak Grad:      8.9 mmHg AV Mean Grad:      5.0 mmHg LVOT Vmax:         122.00 cm/s LVOT Vmean:        81.200 cm/s LVOT VTI:          0.191 [name] LVOT/AV VTI ratio: 0.86  AORTA Ao Asc diam: 3.20 cm MITRAL VALVE                TRICUSPID VALVE MV Area (PHT): 4.49 cm     TR Peak grad:  32.5 mmHg MV Area VTI:   2.00 cm     TR Vmax:        285.00 cm/s MV Peak grad:  12.8 mmHg MV Mean grad:  5.0 mmHg     SHUNTS MV Vmax:       1.79 [name]/s     Systemic VTI:  0.19 [name] MV Vmean:      98.9 cm/s    Systemic Diam:  2.20 cm MV Decel Time: 169 msec MV E velocity: 89.40 cm/s MV A velocity: 149.00 cm/s MV E/A ratio:  0.60 Dalton McleanMD Electronically signed by Wilfred Lacy Signature Date/Time: 05/01/2021/4:48:48 PM    Final     Assessment/Plan  1. Type 2 diabetes mellitus with hyperglycemia, with long-term current use of insulin (HCC) Lab Results  Component Value Date   HGBA1C 6.7 (H) 04/30/2021   -  CBGs stable, continue Metformin, Victoza and  Insulin aspart -  monitor CBGs  2. Malnutrition of moderate degree CMP Latest Ref Rng & Units 05/16/2021 05/08/2021 05/07/2021  Glucose 70 - 99 mg/dL - 84(O) 82  BUN 4 - 21 12 5(L) <5(L)  Creatinine 0.5 - 1.1 0.5 0.60 0.54  Sodium 137 - 147 146 143 141  Potassium 3.4 - 5.3 3.8 4.2 3.4(L)  Chloride 99 - 108 111(A) 109 104  CO2 13 - 22 23(A) 26 29  Calcium 8.7 - 10.7 9.9 8.8(L) 8.8(L)  Total Protein 6.5 - 8.1 g/dL - - -  Total Bilirubin 0.3 - 1.2 mg/dL - - -  Alkaline Phos 25 - 125 98 - -  AST 13 - 35 22 - -  ALT 7 - 35 11 - -   -   albumin 3.1, improved -Continue supplementation with Pro-Stat and Ensure  3. Essential hypertension -Blood pressure well controlled Continue current medications  4. Coronary artery disease involving native coronary artery of native heart, unspecified whether angina present -   S/p CABG in 2017 and PCI/DES to RCA -  denies chest pain -     Continue NTG as needed and aspirin  -     Follow-up with cardiology  5. Sepsis with encephalopathy and septic shock, due to unspecified organism [facility]) Lab Results  Component Value Date   WBC 5.2 05/22/2021   -   Continues to take Augmentin -    S/p thoracentesis on 05/01/2021, AFB culture negative   Family/ staff Communication: Discussed plan of care with resident and charge nurse.  Labs/tests ordered:   None    [name] Gower, DNP, MSN, FNP-BC [facility] [phone] (Monday-Friday 8:00 a.[name]. - 5:00 p.[name].) [phone] (after hours)

## 2021-05-22 ENCOUNTER — Other Ambulatory Visit: Payer: Self-pay | Admitting: Gastroenterology

## 2021-05-22 ENCOUNTER — Ambulatory Visit (INDEPENDENT_AMBULATORY_CARE_PROVIDER_SITE_OTHER): Payer: Medicare HMO | Admitting: Physician Assistant

## 2021-05-22 ENCOUNTER — Other Ambulatory Visit: Payer: Self-pay

## 2021-05-22 ENCOUNTER — Encounter: Payer: Self-pay | Admitting: Physician Assistant

## 2021-05-22 VITALS — BP 112/86 | HR 115 | Ht 66.25 in | Wt 151.6 lb

## 2021-05-22 DIAGNOSIS — I255 Ischemic cardiomyopathy: Secondary | ICD-10-CM

## 2021-05-22 DIAGNOSIS — E119 Type 2 diabetes mellitus without complications: Secondary | ICD-10-CM | POA: Diagnosis not present

## 2021-05-22 DIAGNOSIS — J869 Pyothorax without fistula: Secondary | ICD-10-CM | POA: Diagnosis not present

## 2021-05-22 DIAGNOSIS — I25118 Atherosclerotic heart disease of native coronary artery with other forms of angina pectoris: Secondary | ICD-10-CM

## 2021-05-22 DIAGNOSIS — R1319 Other dysphagia: Secondary | ICD-10-CM

## 2021-05-22 DIAGNOSIS — I25708 Atherosclerosis of coronary artery bypass graft(s), unspecified, with other forms of angina pectoris: Secondary | ICD-10-CM | POA: Diagnosis not present

## 2021-05-22 DIAGNOSIS — E785 Hyperlipidemia, unspecified: Secondary | ICD-10-CM

## 2021-05-22 DIAGNOSIS — I10 Essential (primary) hypertension: Secondary | ICD-10-CM

## 2021-05-22 LAB — CBC WITH DIFFERENTIAL
Basophils Absolute: 0.1 10*3/uL (ref 0.0–0.2)
Basos: 1 %
EOS (ABSOLUTE): 0.2 10*3/uL (ref 0.0–0.4)
Eos: 4 %
Hematocrit: 37.5 % (ref 34.0–46.6)
Hemoglobin: 12.5 g/dL (ref 11.1–15.9)
Immature Grans (Abs): 0 10*3/uL (ref 0.0–0.1)
Immature Granulocytes: 0 %
Lymphocytes Absolute: 1.1 10*3/uL (ref 0.7–3.1)
Lymphs: 21 %
MCH: 28.7 pg (ref 26.6–33.0)
MCHC: 33.3 g/dL (ref 31.5–35.7)
MCV: 86 fL (ref 79–97)
Monocytes Absolute: 0.5 10*3/uL (ref 0.1–0.9)
Monocytes: 10 %
Neutrophils Absolute: 3.3 10*3/uL (ref 1.4–7.0)
Neutrophils: 64 %
RBC: 4.36 x10E6/uL (ref 3.77–5.28)
RDW: 15.8 % — ABNORMAL HIGH (ref 11.7–15.4)
WBC: 5.2 10*3/uL (ref 3.4–10.8)

## 2021-05-22 MED ORDER — AMLODIPINE BESYLATE 2.5 MG PO TABS: 2.5000 mg | ORAL_TABLET | Freq: Every day | ORAL | 1 refills | Status: DC

## 2021-05-22 MED ORDER — METOPROLOL TARTRATE 25 MG PO TABS: 25.0000 mg | ORAL_TABLET | Freq: Two times a day (BID) | ORAL | 3 refills | Status: DC

## 2021-05-22 NOTE — Progress Notes (Signed)
Cardiology Office Note:    Date:  05/24/2021   ID:  JILLAINE WAREN, DOB 02-11-1961, MRN [id number]  PCP:  Leeroy Cha, MD   [facility] Providers Cardiologist:  [name], MD     Referring MD: Leeroy Cha,*   Chief Complaint  Patient presents with   Follow-up    Seen for Dr. Martinique    History of Present Illness:    KYRSTAN GOTWALT is a 61 y.o. female with a hx of CAD s/ CABG 2017, HTN, HLD, DM II, and lupus, psoriatic arthritis.  Patient initially hospitalized with NSTEMI in April 2017, cardiac catheterization revealed severe three-vessel disease.  He ultimately underwent CABG x3 with LIMA-LAD, SVG-RCA, SVG-OM 2.  Postprocedure, he was discharged on aspirin and Plavix.  He returned back to the hospital in July 2017 with complaint of chest discomfort and noted to have ST elevation in the inferior and lateral leads.  Emergent cardiac catheterization revealed patent LIMA to LAD, occluded SVG to OM, occluded SVG to RCA, distal RCA had 99% stenosis, she ultimately underwent Promus DES to distal RCA.  Plavix switched to Brilinta.  Echocardiogram showed EF 55 to 60%, inferior hypokinesis.  Her Zocor was switched to Lipitor.   Patient was seen in December 2018 with atypical lateral chest pain that is worse with deep breathing.  Troponin was negative.  Echocardiogram showed EF 45 to 50%, this was reviewed by Dr. Martinique who felt there was no significant change when compared to the previous echo.  Patient was admitted in January 2022 with accelerated hypertension after presenting with chest pain.  Serial troponin was borderline elevated at 23-->29.  She was started on amlodipine, Imdur was increased.  She was last seen by Dr. Martinique on 10/31/2020 at which time she was doing well.  She was still having atypical chest discomfort however it was felt that unlikely to be cardiac in nature.  Since the last visit, patient has been to the emergency room several times since January  2023.  She was seen for chest pain on 04/06/2021 that woke her up from sleep.  Troponin negative x2.  She was treated with sucralfate.    More recently, patient was admitted to the hospital on 04/30/2021 with altered mental status and was lying partially in bed.  She was found by her son.  On arrival, she was hypotensive concerning for sepsis.  She has been 9 days in the hospital.  Images revealed an empyema, patient underwent thoracentesis on 05/01/2021.  Afterward, she was treated with antibiotics.  Hospital course was complicated by AKI and thrombocytopenia which was felt to be reactive to sepsis.  CT soft tissue of the neck performed on 2/1 revealed thyroid nodule, it was deferred to PCP to consider FNA as outpatient.  Echocardiogram obtained during the hospitalization on 05/01/2021 revealed EF 45%, regional wall motion abnormality with basal to mid inferior, basal to mid inferolateral akinesis suggestive of prior MI, grade 1 DD, RVSP 35.5 mmHg, grade 1 DD.  She was discharged on 2 weeks of Augmentin.  Patient presents today for 69-monthfollow-up.  She does have occasional chest discomfort, however chest discomfort may occur both at rest and during exertion.  Her chest discomfort is fairly mild and she attributed to acid reflux.  Her EKG was initially interpreted as acute MI by machine.  I reviewed her EKG with DOD Dr. TShelva Majestic her Q waves in the inferior lead is old, there is no evidence of acute MI.  She has poor  R wave progression in V3 through V6 that seems to be new when compared to the recent EKG, I reviewed with Dr. Claiborne Billings, there is no evidence of acute MI at this time.  Patient denies any chest discomfort in the office.  Dr. Claiborne Billings recommended addition of low-dose beta-blocker.  I will decrease her amlodipine to 2.5 mg daily.  Add metoprolol tartrate 25 mg twice a day.  Recent echocardiogram shows EF of 45%.  She is mildly tachycardic in the office today, heart rate is 115 bpm.  We will obtain a CBC  with differential to make sure she is not becoming septic again.  I plan to bring the patient back in 2 to 3 weeks for reassessment.   Past Medical History:  Diagnosis Date   Acute ST elevation myocardial infarction (STEMI) involving right coronary artery ([place]) 10/04/2015   x 2   Anxiety    Anxiety and depression    CAD (coronary artery disease)    a. CABG 06/2015: LIMA-LAD, SVG-RCA, SVG-OM2 b. STEMI s/p PCI to distal RCA lesion with a small Promus Premier stent. ( patent LIMA to the LAD, occluded SVG to OM, occluded SVG to RCA.)   CAD (coronary artery disease) of artery bypass graft; occluded SVG to RCA and occluded SVG to OM 10/08/2015   Empty sella (HCC)    GERD (gastroesophageal reflux disease)    Headache    from stress   Hematuria wed 06-13-2020   Hidradenitis axillaris    History of kidney stones    Hyperlipemia    Hypertension    Lupus ([place]) dx'd 2008   "they think I have this; have to get retested" (06/12/2015)   Obesity (BMI 30.0-34.9)    Pneumonia X 2   last time yrs ago   PONV (postoperative nausea and vomiting)    likes iv med or scopolamine patch works well   Psoriasis 06/15/2020   hands elbows, ears, small amount on scalp and bridge of nose   Rheumatoid aortitis    psorriatic arthritis   Rheumatoid arthritis ([place])    S/P angioplasty with stent; 10/04/15 to distal RCA lesion. with Promus premier. 10/08/2015   Scoliosis    Sleep apnea    patient states "it is mild" no cpap needed   Stroke [facility]) March 2014   left MCA branches; "they say I have them all the time", denies residual on 06/12/2015   Tobacco abuse    Type II diabetes mellitus ([place])    Urinary frequency    and urgency   Wears glasses    for reading    Past Surgical History:  Procedure Laterality Date   ABDOMINAL HYSTERECTOMY  31 yrs ago   total has ovaies   BREAST BIOPSY Bilateral yrd ago   benign   CARDIAC CATHETERIZATION N/A 06/13/2015   Procedure: Left Heart Cath and Coronary Angiography;   Surgeon: Peter M Martinique, MD;  Location: [place] [facility];  Service: Cardiovascular;  Laterality: N/A;   CARDIAC CATHETERIZATION N/A 10/04/2015   Procedure: Left Heart Cath and Coronary Angiography;  Surgeon: [name], MD;  Location: [place] [facility];  Service: Cardiovascular;  Laterality: N/A;   CARDIAC CATHETERIZATION N/A 10/04/2015   Procedure: Coronary Stent Intervention;  Surgeon: [name], MD;  Location: [place] [facility];  Service: Cardiovascular;  Laterality: N/A;   CORONARY ARTERY BYPASS GRAFT N/A 06/19/2015   Procedure: CORONARY ARTERY BYPASS GRAFTING (CABG) TIMES FOUR USING LEFT INTERNAL MAMMARY, RIGHT SAPHENOUS LEG VEIN AND CRYO SAPHENOUS  VEIN;  Surgeon: [name], MD;  Location: [facility];  Service: Open Heart Surgery;  Laterality: N/A;   CYSTOSCOPY WITH RETROGRADE PYELOGRAM, URETEROSCOPY AND STENT PLACEMENT Left 06/21/2020   Procedure: CYSTOSCOPY WITH RETROGRADE PYELOGRAM, URETEROSCOPY, BASKET STONE EXTRACTION AND  STENT PLACEMENT;  Surgeon: Robley Fries, MD;  Location: [facility];  Service: Urology;  Laterality: Left;   DILATION AND CURETTAGE OF UTERUS  yrs ago   HOLMIUM LASER APPLICATION Left 0/10/1446   Procedure: HOLMIUM LASER APPLICATION;  Surgeon: Robley Fries, MD;  Location: [facility];  Service: Urology;  Laterality: Left;   HYDRADENITIS EXCISION Right 12/20/2019   Procedure: EXCISION OF RIGHT  HIDRADENITIS AXILLARY;  Surgeon: Donnie Mesa, MD;  Location: [facility];  Service: General;  Laterality: Right;   IR URETERAL STENT LEFT NEW ACCESS W/O SEP NEPHROSTOMY CATH  01/10/2019   NEPHROLITHOTOMY Left 01/10/2019   Procedure: NEPHROLITHOTOMY PERCUTANEOUS;  Surgeon: Kathie Rhodes, MD;  Location: [facility];  Service: Urology;  Laterality: Left;   TEE WITHOUT CARDIOVERSION N/A 06/19/2015   Procedure: TRANSESOPHAGEAL ECHOCARDIOGRAM (TEE);  Surgeon: [name], MD;  Location: [facility];  Service: Open Heart Surgery;  Laterality:  N/A;   TONSILLECTOMY  age 13   TUBAL LIGATION  many yrs ago    Current Medications: Current Meds  Medication Sig   Amino Acids-Protein Hydrolys (FEEDING SUPPLEMENT, PRO-STAT SUGAR FREE 64,) LIQD Take 30 mLs by mouth in the morning and at bedtime.   amoxicillin-clavulanate (AUGMENTIN) 875-125 MG tablet Take 1 tablet by mouth every 12 (twelve) hours for 20 days.   aspirin EC 81 MG EC tablet Take 1 tablet (81 mg total) by mouth daily.   atorvastatin (LIPITOR) 80 MG tablet TAKE 1 TABLET EVERY DAY  AT  6PM   B Complex-C (B-COMPLEX WITH VITAMIN C) tablet Place 1 tablet into feeding tube daily.   cholecalciferol (VITAMIN D) 25 MCG (1000 UNIT) tablet Take 1,000 Units by mouth in the morning and at bedtime.   escitalopram (LEXAPRO) 10 MG tablet Take 10 mg by mouth daily.   feeding supplement (ENSURE ENLIVE / ENSURE PLUS) LIQD Take 237 mLs by mouth 2 (two) times daily between meals.   gabapentin (NEURONTIN) 100 MG capsule Take 100 mg by mouth 2 (two) times daily.   guaiFENesin (MUCINEX) 600 MG 12 hr tablet Take 600 mg by mouth 2 (two) times daily as needed (congestion.).   metFORMIN (GLUCOPHAGE) 1000 MG tablet Take 1,000 mg by mouth 2 (two) times daily.   metoprolol tartrate (LOPRESSOR) 25 MG tablet Take 1 tablet (25 mg total) by mouth 2 (two) times daily.   pantoprazole (PROTONIX) 40 MG tablet Take 1 tablet (40 mg total) by mouth daily.   thiamine 100 MG tablet Take 2.5 tablets (250 mg total) by mouth daily.   traMADol (ULTRAM) 50 MG tablet Take 1 tablet (50 mg total) by mouth every 8 (eight) hours as needed.   [DISCONTINUED] amLODipine (NORVASC) 5 MG tablet Take 1 tablet (5 mg total) by mouth daily.     Allergies:   Apremilast and Latex   Social History   Socioeconomic History   Marital status: Widowed    Spouse name: Not on file   Number of children: 2   Years of education: college   Highest education level: Some college, no degree  Occupational History   Occupation: Disability  Tobacco  Use   Smoking status: Former    Packs/day: 0.50    Years: 36.00    Pack  years: 18.00    Types: Cigarettes    Quit date: 06/12/2014    Years since quitting: 6.9   Smokeless tobacco: Never  Vaping Use   Vaping Use: Never used  Substance and Sexual Activity   Alcohol use: No    Alcohol/week: 0.0 standard drinks   Drug use: No   Sexual activity: Not Currently  Other Topics Concern   Not on file  Social History Narrative   Patient currently lives at home with her disabled husband who was shot in 2009 and he is total care. She lives with her son and daughter and there is an 8 that comes in. Patient currently is on disability   She occasionally drinks caffeine.    Social Determinants of Radio broadcast assistant Strain: Not on file  Food Insecurity: Food Insecurity Present   Worried About Charity fundraiser in the Last Year: Sometimes true   Arboriculturist in the Last Year: Sometimes true  Transportation Needs: Public librarian (Medical): No   Lack of Transportation (Non-Medical): Yes  Physical Activity: Not on file  Stress: Not on file  Social Connections: Not on file     Family History: The patient's family history includes Alzheimer's disease in an other family member; Breast cancer in her mother and another family member; Depression in her son; Diabetes in her mother and another family member; Heart failure in her mother; Stroke in her maternal aunt.  ROS:   Please see the history of present illness.     All other systems reviewed and are negative.  EKGs/Labs/Other Studies Reviewed:    The following studies were reviewed today:  Echo 05/01/2021 1. Left ventricular ejection fraction, by estimation, is 45%. The left  ventricle has mildly decreased function. The left ventricle demonstrates  regional wall motion abnormalities with basal to mid inferior and basal to  mid inferolateral akinesis  suggestive of prior MI. There is moderate  left ventricular hypertrophy.  Left ventricular diastolic parameters are consistent with Grade I  diastolic dysfunction (impaired relaxation).   2. Right ventricular systolic function is normal. The right ventricular  size is normal. There is normal pulmonary artery systolic pressure. The  estimated right ventricular systolic pressure is 40.9 mmHg.   3. The mitral valve is normal in structure. No evidence of mitral valve  regurgitation. No evidence of mitral stenosis.   4. The aortic valve is tricuspid. Aortic valve regurgitation is not  visualized. Aortic valve sclerosis/calcification is present, without any  evidence of aortic stenosis.   5. The inferior vena cava is normal in size with greater than 50%  respiratory variability, suggesting right atrial pressure of 3 mmHg.   EKG:  EKG is ordered today.  The ekg ordered today demonstrates normal sinus rhythm, Q waves in the inferior lead, poor R wave progression in V3 through V6 that appears to be new when compared to the previous EKG.  EKG was reviewed with DOD Dr. Claiborne Billings.  Recent Labs: 04/30/2021: TSH 0.092 05/08/2021: Magnesium 2.1 05/16/2021: ALT 11; BUN 12; Creatinine 0.5; Platelets 334; Potassium 3.8; Sodium 146 05/22/2021: Hemoglobin 12.5  Recent Lipid Panel    Component Value Date/Time   CHOL 128 10/03/2019 1455   TRIG 211 (H) 10/03/2019 1455   HDL 63 10/03/2019 1455   CHOLHDL 2.0 10/03/2019 1455   CHOLHDL 2.9 10/05/2015 0305   VLDL 28 10/05/2015 0305   LDLCALC 32 10/03/2019 1455     Risk Assessment/Calculations:  Physical Exam:    VS:  BP 112/86    Pulse (!) 115    Ht 5' 6.25" (1.683 m)    Wt 151 lb 9.6 oz (68.8 kg)    SpO2 94%    BMI 24.28 kg/m     Wt Readings from Last 3 Encounters:  05/22/21 151 lb 9.6 oz (68.8 kg)  05/20/21 149 lb 1.6 oz (67.6 kg)  05/10/21 151 lb 6.4 oz (68.7 kg)     GEN:  Well nourished, well developed in no acute distress HEENT: Normal NECK: No JVD; No carotid bruits LYMPHATICS:  No lymphadenopathy CARDIAC: RRR, no murmurs, rubs, gallops RESPIRATORY:  Clear to auscultation without rales, wheezing or rhonchi  ABDOMEN: Soft, non-tender, non-distended MUSCULOSKELETAL:  No edema; No deformity  SKIN: Warm and dry NEUROLOGIC:  Alert and oriented x 3 PSYCHIATRIC:  Normal affect   ASSESSMENT:    1. Coronary artery disease involving coronary bypass graft of native heart with other forms of angina pectoris ([place])   2. Essential hypertension   3. Hyperlipidemia LDL goal <70   4. Controlled type 2 diabetes mellitus without complication, without long-term current use of insulin ([place])   5. Ischemic cardiomyopathy   6. Empyema lung (HCC)    PLAN:    In order of problems listed above:  CAD s/p CABG: Denies any chest pain.  EKG however shows new poor R wave progression in the anterior leads.  Chronic Q waves in the inferior lead.  It was originally read as acute MI, however patient denies any obvious chest pain.  I reviewed the EKG with DOD Dr. Claiborne Billings who felt her EKG is unlikely to be ACS.  She is mildly tachycardic today, given the recent empyema, I recommended a CBC with differential to make sure she is not having persistent infection.  Hypertension: Blood pressure stable on current therapy  Hyperlipidemia: On Lipitor  DM2: Managed by primary care provider  Ischemic cardiomyopathy: EF 45% on recent echocardiogram, this is unchanged when compared to the previous echocardiogram.  She does not appears to be volume overloaded  Empyema: Recently underwent thoracentesis on 05/01/2021, she was treated with a course of antibiotic.       Medication Adjustments/Labs and Tests Ordered: Current medicines are reviewed at length with the patient today.  Concerns regarding medicines are outlined above.  Orders Placed This Encounter  Procedures   CBC With Differential   EKG 12-Lead   Meds ordered this encounter  Medications   amLODipine (NORVASC) 2.5 MG tablet    Sig: Take 1  tablet (2.5 mg total) by mouth daily.    Dispense:  60 tablet    Refill:  1   metoprolol tartrate (LOPRESSOR) 25 MG tablet    Sig: Take 1 tablet (25 mg total) by mouth 2 (two) times daily.    Dispense:  180 tablet    Refill:  3    Patient Instructions  Medication Instructions:  Decrease Amlodipine to 2.5 mg daily  START Metoprolol Tartrate 25 mg twice daily   *If you need a refill on your cardiac medications before your next appointment, please call your pharmacy*   Lab Work: CBC today   If you have labs (blood work) drawn today and your tests are completely normal, you will receive your results only by: [place] (if you have MyChart) OR A paper copy in the mail If you have any lab test that is abnormal or we need to change your treatment, we will call you to review  the results.   Follow-Up: At [facility], you and your health needs are our priority.  As part of our continuing mission to provide you with exceptional heart care, we have created designated Provider Care Teams.  These Care Teams include your primary Cardiologist (physician) and Advanced Practice Providers (APPs -  Physician Assistants and Nurse Practitioners) who all work together to provide you with the care you need, when you need it.  We recommend signing up for the patient portal called "MyChart".  Sign up information is provided on this After Visit Summary.  MyChart is used to connect with patients for Virtual Visits (Telemedicine).  Patients are able to view lab/test results, encounter notes, upcoming appointments, etc.  Non-urgent messages can be sent to your provider as well.   To learn more about what you can do with MyChart, go to [email or web address].    Your next appointment:   March 30th at 8:00 AM  The format for your next appointment:   In Person  Provider:   Almyra Deforest, [name]       Signed, Almyra Deforest, [place]  05/24/2021 3:32 PM    [place]

## 2021-05-22 NOTE — Patient Instructions (Signed)
Medication Instructions:  ?Decrease Amlodipine to 2.5 mg daily  ?START Metoprolol Tartrate 25 mg twice daily  ? ?*If you need a refill on your cardiac medications before your next appointment, please call your pharmacy* ? ? ?Lab Work: ?CBC today  ? ?If you have labs (blood work) drawn today and your tests are completely normal, you will receive your results only by: ?MyChart Message (if you have MyChart) OR ?A paper copy in the mail ?If you have any lab test that is abnormal or we need to change your treatment, we will call you to review the results. ? ? ?Follow-Up: ?At [facility], you and your health needs are our priority.  As part of our continuing mission to provide you with exceptional heart care, we have created designated Provider Care Teams.  These Care Teams include your primary Cardiologist (physician) and Advanced Practice Providers (APPs -  Physician Assistants and Nurse Practitioners) who all work together to provide you with the care you need, when you need it. ? ?We recommend signing up for the patient portal called "MyChart".  Sign up information is provided on this After Visit Summary.  MyChart is used to connect with patients for Virtual Visits (Telemedicine).  Patients are able to view lab/test results, encounter notes, upcoming appointments, etc.  Non-urgent messages can be sent to your provider as well.   ?To learn more about what you can do with MyChart, go to [email or web address].   ? ?Your next appointment:   ?March 30th at 8:00 AM ? ?The format for your next appointment:   ?In Person ? ?Provider:   ?Almyra Deforest, [name]  ? ? ? ?

## 2021-05-24 ENCOUNTER — Encounter: Payer: Self-pay | Admitting: Physician Assistant

## 2021-05-31 ENCOUNTER — Encounter: Payer: Self-pay | Admitting: Adult Health

## 2021-05-31 ENCOUNTER — Non-Acute Institutional Stay (SKILLED_NURSING_FACILITY): Payer: Medicare HMO | Admitting: Adult Health

## 2021-05-31 ENCOUNTER — Ambulatory Visit
Admission: RE | Admit: 2021-05-31 | Discharge: 2021-05-31 | Disposition: A | Discharge status: Home / Self Care | Payer: Medicare HMO | Source: Ambulatory Visit | Attending: Gastroenterology | Admitting: Gastroenterology

## 2021-05-31 ENCOUNTER — Other Ambulatory Visit: Payer: Self-pay | Admitting: Gastroenterology

## 2021-05-31 DIAGNOSIS — E1140 Type 2 diabetes mellitus with diabetic neuropathy, unspecified: Secondary | ICD-10-CM

## 2021-05-31 DIAGNOSIS — I2510 Atherosclerotic heart disease of native coronary artery without angina pectoris: Secondary | ICD-10-CM | POA: Diagnosis not present

## 2021-05-31 DIAGNOSIS — R1319 Other dysphagia: Secondary | ICD-10-CM

## 2021-05-31 DIAGNOSIS — I10 Essential (primary) hypertension: Secondary | ICD-10-CM | POA: Diagnosis not present

## 2021-05-31 DIAGNOSIS — F339 Major depressive disorder, recurrent, unspecified: Secondary | ICD-10-CM

## 2021-05-31 DIAGNOSIS — R1310 Dysphagia, unspecified: Secondary | ICD-10-CM | POA: Diagnosis not present

## 2021-05-31 DIAGNOSIS — Z794 Long term (current) use of insulin: Secondary | ICD-10-CM | POA: Diagnosis not present

## 2021-05-31 NOTE — Progress Notes (Signed)
? ?Location:  [facility] ?Nursing Home Room Number: 216 A ?Place of Service:  SNF (31) ?Provider:  Durenda Age, DNP, FNP-BC ? ?Patient Care Team: ?Leeroy Cha, MD as PCP - General (Internal Medicine) ?Martinique, Peter M, MD as PCP - Cardiology (Cardiology) ? ?Extended Emergency Contact Information ?Primary Emergency Contact: Mcmiller,[name] ?Address: [address] ?         [place], [place] [address] [place] ?Mobile Phone: [phone] ?Relation: Son ?Secondary Emergency Contact: Chiang,Ronald ?Mobile Phone: [phone] ?Relation: Son ? ?Code Status:  FULL CODE ? ?Goals of care: Advanced Directive information ?Advanced Directives 05/31/2021  ?Does Patient Have a Medical Advance Directive? Yes  ?Type of Advance Directive Out of facility DNR (pink MOST or yellow form)  ?Does patient want to make changes to medical advance directive? No - Patient declined  ?Would patient like information on creating a medical advance directive? -  ?Pre-existing out of facility DNR order (yellow form or pink MOST form) Pink MOST form placed in chart (order not valid for inpatient use)  ? ? ? ?Chief Complaint  ?Patient presents with  ? Acute Visit  ?  Short term rehab  ? ? ?HPI:  ?[name] seen today for short-term rehabilitation visit. She is currently having PT,  OT and ST. She has a PMH of psoriatric arthritis, type 2 diabetes mellitus, hypertension and CAD s/p CABG. ? ?Essential hypertension  -  SBPs ranging from 111 to 139, with outlier 154. She takes Amlodipine 2.5 mg daily and Metoprolol tartrate 25 mg BID ? ?Type 2 diabetes mellitus with diabetic neuropathy, with long-term current use of insulin (HCC) -  CBGs ranging from  97 to 158, with outliers 67 and 400. She takes Metformin 500 mg BID and Insulin aspart 5 units SQ TIDac ? ?Coronary artery disease involving native coronary artery of native heart, unspecified whether angina present  - denies chest pain, takes NTG PRN,  Repatha 420 mg SQ monthly and Atorvastatin  80 mg 1 tab daily ? ?Major depression, recurrent, chronic (HCC) -   PHQ-9 score is 0, takes Escitalopram 10 mg 1 tab at HS ? ?Past Medical History:  ?Diagnosis Date  ? Acute ST elevation myocardial infarction (STEMI) involving right coronary artery ([place]) 10/04/2015  ? x 2  ? Anxiety   ? Anxiety and depression   ? CAD (coronary artery disease)   ? a. CABG 06/2015: LIMA-LAD, SVG-RCA, SVG-OM2 b. STEMI s/p PCI to distal RCA lesion with a small Promus Premier stent. ( patent LIMA to the LAD, occluded SVG to OM, occluded SVG to RCA.)  ? CAD (coronary artery disease) of artery bypass graft; occluded SVG to RCA and occluded SVG to OM 10/08/2015  ? Empty sella ([place])   ? GERD (gastroesophageal reflux disease)   ? Headache   ? from stress  ? Hematuria wed 06-13-2020  ? Hidradenitis axillaris   ? History of kidney stones   ? Hyperlipemia   ? Hypertension   ? Lupus ([place]) dx'd 2008  ? "they think I have this; have to get retested" (06/12/2015)  ? Obesity (BMI 30.0-34.9)   ? Pneumonia X 2  ? last time yrs ago  ? PONV (postoperative nausea and vomiting)   ? likes iv med or scopolamine patch works well  ? Psoriasis 06/15/2020  ? hands elbows, ears, small amount on scalp and bridge of nose  ? Rheumatoid aortitis   ? psorriatic arthritis  ? Rheumatoid arthritis ([place])   ? S/P  angioplasty with stent; 10/04/15 to distal RCA lesion. with Promus premier. 10/08/2015  ? Scoliosis   ? Sleep apnea   ? patient states "it is mild" no cpap needed  ? Stroke [facility]) March 2014  ? left MCA branches; "they say I have them all the time", denies residual on 06/12/2015  ? Tobacco abuse   ? Type II diabetes mellitus ([place])   ? Urinary frequency   ? and urgency  ? Wears glasses   ? for reading  ? ?Past Surgical History:  ?Procedure Laterality Date  ? ABDOMINAL HYSTERECTOMY  33 yrs ago  ? total has ovaies  ? BREAST BIOPSY Bilateral yrd ago  ? benign  ? CARDIAC CATHETERIZATION N/A 06/13/2015  ? Procedure: Left Heart Cath and  Coronary Angiography;  Surgeon: Peter M Martinique, MD;  Location: [place] CV [facility];  Service: Cardiovascular;  Laterality: N/A;  ? CARDIAC CATHETERIZATION N/A 10/04/2015  ? Procedure: Left Heart Cath and Coronary Angiography;  Surgeon: [name], MD;  Location: [place] CV [facility];  Service: Cardiovascular;  Laterality: N/A;  ? CARDIAC CATHETERIZATION N/A 10/04/2015  ? Procedure: Coronary Stent Intervention;  Surgeon: [name], MD;  Location: [place] [facility];  Service: Cardiovascular;  Laterality: N/A;  ? CORONARY ARTERY BYPASS GRAFT N/A 06/19/2015  ? Procedure: CORONARY ARTERY BYPASS GRAFTING (CABG) TIMES FOUR USING LEFT INTERNAL MAMMARY, RIGHT SAPHENOUS LEG VEIN AND CRYO SAPHENOUS VEIN;  Surgeon: [name], MD;  Location: [facility];  Service: Open Heart Surgery;  Laterality: N/A;  ? CYSTOSCOPY WITH RETROGRADE PYELOGRAM, URETEROSCOPY AND STENT PLACEMENT Left 06/21/2020  ? Procedure: CYSTOSCOPY WITH RETROGRADE PYELOGRAM, URETEROSCOPY, BASKET STONE EXTRACTION AND  STENT PLACEMENT;  Surgeon: Robley Fries, MD;  Location: [facility];  Service: Urology;  Laterality: Left;  ? DILATION AND CURETTAGE OF UTERUS  yrs ago  ? HOLMIUM LASER APPLICATION Left 03/24/5629  ? Procedure: HOLMIUM LASER APPLICATION;  Surgeon: Robley Fries, MD;  Location: [facility];  Service: Urology;  Laterality: Left;  ? HYDRADENITIS EXCISION Right 12/20/2019  ? Procedure: EXCISION OF RIGHT  HIDRADENITIS AXILLARY;  Surgeon: Donnie Mesa, MD;  Location: [facility];  Service: General;  Laterality: Right;  ? IR URETERAL STENT LEFT NEW ACCESS W/O SEP NEPHROSTOMY CATH  01/10/2019  ? NEPHROLITHOTOMY Left 01/10/2019  ? Procedure: NEPHROLITHOTOMY PERCUTANEOUS;  Surgeon: Kathie Rhodes, MD;  Location: [facility];  Service: Urology;  Laterality: Left;  ? TEE WITHOUT CARDIOVERSION N/A 06/19/2015  ? Procedure: TRANSESOPHAGEAL ECHOCARDIOGRAM (TEE);  Surgeon: [name], MD;  Location: [place];  Service: Open Heart  Surgery;  Laterality: N/A;  ? TONSILLECTOMY  age 39  ? TUBAL LIGATION  many yrs ago  ? ? ?Allergies  ?Allergen Reactions  ? Apremilast Other (See Comments)  ?  unknown  ? Latex Itching, Swelling and Rash  ? ? ?Outpatient Encounter Medications as of 05/31/2021  ?Medication Sig  ? acetaminophen (TYLENOL) 325 MG tablet Take 650 mg by mouth every 8 (eight) hours as needed for moderate pain or headache. Give 2 tablets = '1300mg'$  po qAM  ? Amino Acids-Protein Hydrolys (FEEDING SUPPLEMENT, PRO-STAT SUGAR FREE 64,) LIQD Take 30 mLs by mouth in the morning and at bedtime.  ? amLODipine (NORVASC) 2.5 MG tablet Take 1 tablet (2.5 mg total) by mouth daily.  ? aspirin EC 81 MG EC tablet Take 1 tablet (81 mg total) by mouth daily.  ? atorvastatin (LIPITOR) 80 MG tablet TAKE 1 TABLET EVERY DAY  AT  6PM  ? B Complex-C (B-COMPLEX WITH VITAMIN C) tablet Place 1 tablet into feeding tube daily.  ? cholecalciferol (VITAMIN D) 25 MCG (1000 UNIT) tablet Take 1,000 Units by mouth in the morning and at bedtime.  ? escitalopram (LEXAPRO) 10 MG tablet Take 10 mg by mouth daily.  ? Evolocumab with Infusor ([place]) 420 MG/3.5ML SOCT Inject 420 mg into the skin every 30 (thirty) days.  ? feeding supplement (ENSURE ENLIVE / ENSURE PLUS) LIQD Take 237 mLs by mouth 2 (two) times daily between meals.  ? gabapentin (NEURONTIN) 100 MG capsule Take 100 mg by mouth 2 (two) times daily.  ? guaiFENesin (MUCINEX) 600 MG 12 hr tablet Take 600 mg by mouth 2 (two) times daily as needed (congestion.).  ? Insulin Aspart FlexPen (NOVOLOG) 100 UNIT/ML INJECT 5 UNITS SUBCUTANEOUSLY DAILY BEFORE EACH MEAL FOR CBG >/=300 (PRIME PEN W/2 UNITS PRIOR TO EACH USE)  ? metFORMIN (GLUCOPHAGE) 500 MG tablet Take 1,000 mg by mouth 2 (two) times daily.  ? metoprolol tartrate (LOPRESSOR) 25 MG tablet Take 1 tablet (25 mg total) by mouth 2 (two) times daily.  ? nitroGLYCERIN (NITROSTAT) 0.4 MG SL tablet DISSOLVE 1 TABLET UNDER THE TONGUE EVERY 5 MINUTES AS   NEEDED FOR CHEST PAIN. MAX  OF 3 TABLETS IN 15 MINUTES. CALL 911 IF PAIN PERSISTS.  ? ondansetron (ZOFRAN-ODT) 4 MG disintegrating tablet Take 4 mg by mouth every 8 (eight) hours as needed for nausea/vomiti

## 2021-06-03 ENCOUNTER — Other Ambulatory Visit: Payer: Self-pay

## 2021-06-03 ENCOUNTER — Telehealth: Payer: Self-pay | Admitting: Cardiology

## 2021-06-03 MED ORDER — AMLODIPINE BESYLATE 2.5 MG PO TABS: 2.5000 mg | ORAL_TABLET | Freq: Every day | ORAL | 1 refills | Status: DC

## 2021-06-03 MED ORDER — METOPROLOL TARTRATE 25 MG PO TABS: 25.0000 mg | ORAL_TABLET | Freq: Two times a day (BID) | ORAL | 3 refills | Status: DC

## 2021-06-03 NOTE — Telephone Encounter (Signed)
? ? ?  Pt c/o medication issue: ? ?1. Name of Medication:  ?amLODipine (NORVASC) 2.5 MG tablet ?metoprolol tartrate (LOPRESSOR) 25 MG tablet ? ?2. How are you currently taking this medication (dosage and times per day)? Patient has not started taking either yet.  ? ?3. Are you having a reaction (difficulty breathing--STAT)?  ? ?4. What is your medication issue? Patient needs to know how to get the medications sent to her while she is at the Rehab facility. She is currently at [facility] ([facility] She is not able to get the medications from [facility].  ?

## 2021-06-03 NOTE — Telephone Encounter (Signed)
I contacted patient, advised that we could send the fax information over to them of the medications.  ? ?Can you print these and have Dr.Jordan sign?  ? ?I contacted the facility and the fax number is [phone]. ? ?Thank you! ?

## 2021-06-03 NOTE — Telephone Encounter (Signed)
Patient's medication list faxed to [facility] at fax # [phone]. ?

## 2021-06-07 ENCOUNTER — Non-Acute Institutional Stay (SKILLED_NURSING_FACILITY): Payer: Medicare HMO | Admitting: Adult Health

## 2021-06-07 ENCOUNTER — Encounter: Payer: Self-pay | Admitting: Adult Health

## 2021-06-07 DIAGNOSIS — Z794 Long term (current) use of insulin: Secondary | ICD-10-CM

## 2021-06-07 DIAGNOSIS — E785 Hyperlipidemia, unspecified: Secondary | ICD-10-CM

## 2021-06-07 DIAGNOSIS — I10 Essential (primary) hypertension: Secondary | ICD-10-CM

## 2021-06-07 DIAGNOSIS — G629 Polyneuropathy, unspecified: Secondary | ICD-10-CM

## 2021-06-07 DIAGNOSIS — F339 Major depressive disorder, recurrent, unspecified: Secondary | ICD-10-CM

## 2021-06-07 DIAGNOSIS — Z87442 Personal history of urinary calculi: Secondary | ICD-10-CM | POA: Diagnosis not present

## 2021-06-07 DIAGNOSIS — E1140 Type 2 diabetes mellitus with diabetic neuropathy, unspecified: Secondary | ICD-10-CM

## 2021-06-07 NOTE — Progress Notes (Signed)
? ?Location:  [facility] ?Nursing Home Room Number: 216-A ?Place of Service:  SNF (31) ?Provider:  Durenda Age, DNP, FNP-BC ? ?Patient Care Team: ?Leeroy Cha, MD as PCP - General (Internal Medicine) ?Martinique, Peter M, MD as PCP - Cardiology (Cardiology) ? ?Extended Emergency Contact Information ?Primary Emergency Contact: Shatswell,[name] ?Address: [address] ?         [place], [place] [address] [place] ?Mobile Phone: [phone] ?Relation: Son ?Secondary Emergency Contact: Kreager,Ronald ?Mobile Phone: [phone] ?Relation: Son ? ?Code Status:  Full Code ? ?Goals of care: Advanced Directive information ? ?  06/07/2021  ?  1:16 PM  ?Advanced Directives  ?Does Patient Have a Medical Advance Directive? Yes  ?Type of Advance Directive Out of facility DNR (pink MOST or yellow form)  ?Does patient want to make changes to medical advance directive? No - Patient declined  ?Pre-existing out of facility DNR order (yellow form or pink MOST form) Pink MOST form placed in chart (order not valid for inpatient use)  ? ? ? ?Chief Complaint  ?Patient presents with  ? Acute Visit  ?  Short term rehabilitation   ? ? ?HPI:  ?[name] seen today for an acute short-term rehabilitation visit. She was seen in her room today and has just finished PT and OT sessions. She was noted to have bilateral hand dryness.  ? ?Neuropathy -  stable, takes Gabapentin 100 mg 1 capsule  BID ? ?Type 2 diabetes mellitus with diabetic neuropathy, with long-term current use of insulin (HCC) -  CBGs ranging from 82 to272, takes Metformin 500 mg 1 tab BID and Insulin Aspart 5 units SQ daily for CBG >= 300 ? ?Essential hypertension  -  SBPs ranging from 125 to 132, with outlier 151 and 153. Takes Amlodipine 2.5 mg daily and Metoprolol tartrate 25 mg BID ? ?Hyperlipidemia with target LDL less than 70 -  takes Repatha 420 mg SQ monthly and Atorvastatin 80 mg daily ? ?Major depression, recurrent,  chronic (HCC)  -  PHQ-9 score 0, takes Escitalopram 10 mg at bedtime ? ? ?Past Medical History:  ?Diagnosis Date  ? Acute ST elevation myocardial infarction (STEMI) involving right coronary artery ([place]) 10/04/2015  ? x 2  ? Anxiety   ? Anxiety and depression   ? CAD (coronary artery disease)   ? a. CABG 06/2015: LIMA-LAD, SVG-RCA, SVG-OM2 b. STEMI s/p PCI to distal RCA lesion with a small Promus Premier stent. ( patent LIMA to the LAD, occluded SVG to OM, occluded SVG to RCA.)  ? CAD (coronary artery disease) of artery bypass graft; occluded SVG to RCA and occluded SVG to OM 10/08/2015  ? Empty sella ([place])   ? GERD (gastroesophageal reflux disease)   ? Headache   ? from stress  ? Hematuria wed 06-13-2020  ? Hidradenitis axillaris   ? History of kidney stones   ? Hyperlipemia   ? Hypertension   ? Lupus ([place]) dx'd 2008  ? "they think I have this; have to get retested" (06/12/2015)  ? Obesity (BMI 30.0-34.9)   ? Pneumonia X 2  ? last time yrs ago  ? PONV (postoperative nausea and vomiting)   ? likes iv med or scopolamine patch works well  ? Psoriasis 06/15/2020  ? hands elbows, ears, small amount on scalp and bridge of nose  ? Rheumatoid aortitis   ? psorriatic arthritis  ? Rheumatoid arthritis ([place])   ? S/P angioplasty with stent; 10/04/15 to distal RCA  lesion. with Promus premier. 10/08/2015  ? Scoliosis   ? Sleep apnea   ? patient states "it is mild" no cpap needed  ? Stroke [facility]) March 2014  ? left MCA branches; "they say I have them all the time", denies residual on 06/12/2015  ? Tobacco abuse   ? Type II diabetes mellitus ([place])   ? Urinary frequency   ? and urgency  ? Wears glasses   ? for reading  ? ?Past Surgical History:  ?Procedure Laterality Date  ? ABDOMINAL HYSTERECTOMY  33 yrs ago  ? total has ovaies  ? BREAST BIOPSY Bilateral yrd ago  ? benign  ? CARDIAC CATHETERIZATION N/A 06/13/2015  ? Procedure: Left Heart Cath and Coronary Angiography;  Surgeon: Peter M Martinique, MD;  Location: [place] [facility];  Service:  Cardiovascular;  Laterality: N/A;  ? CARDIAC CATHETERIZATION N/A 10/04/2015  ? Procedure: Left Heart Cath and Coronary Angiography;  Surgeon: [name], MD;  Location: [place] [facility];  Service: Cardiovascular;  Laterality: N/A;  ? CARDIAC CATHETERIZATION N/A 10/04/2015  ? Procedure: Coronary Stent Intervention;  Surgeon: [name], MD;  Location: [place] [facility];  Service: Cardiovascular;  Laterality: N/A;  ? CORONARY ARTERY BYPASS GRAFT N/A 06/19/2015  ? Procedure: CORONARY ARTERY BYPASS GRAFTING (CABG) TIMES FOUR USING LEFT INTERNAL MAMMARY, RIGHT SAPHENOUS LEG VEIN AND CRYO SAPHENOUS VEIN;  Surgeon: [name], MD;  Location: [place];  Service: Open Heart Surgery;  Laterality: N/A;  ? CYSTOSCOPY WITH RETROGRADE PYELOGRAM, URETEROSCOPY AND STENT PLACEMENT Left 06/21/2020  ? Procedure: CYSTOSCOPY WITH RETROGRADE PYELOGRAM, URETEROSCOPY, BASKET STONE EXTRACTION AND  STENT PLACEMENT;  Surgeon: Robley Fries, MD;  Location: [facility];  Service: Urology;  Laterality: Left;  ? DILATION AND CURETTAGE OF UTERUS  yrs ago  ? HOLMIUM LASER APPLICATION Left 06/21/4257  ? Procedure: HOLMIUM LASER APPLICATION;  Surgeon: Robley Fries, MD;  Location: [facility];  Service: Urology;  Laterality: Left;  ? HYDRADENITIS EXCISION Right 12/20/2019  ? Procedure: EXCISION OF RIGHT  HIDRADENITIS AXILLARY;  Surgeon: Donnie Mesa, MD;  Location: [facility];  Service: General;  Laterality: Right;  ? IR URETERAL STENT LEFT NEW ACCESS W/O SEP NEPHROSTOMY CATH  01/10/2019  ? NEPHROLITHOTOMY Left 01/10/2019  ? Procedure: NEPHROLITHOTOMY PERCUTANEOUS;  Surgeon: Kathie Rhodes, MD;  [facility];  Service: Urology;  Laterality: Left;  ? TEE WITHOUT CARDIOVERSION N/A 06/19/2015  ? Procedure: TRANSESOPHAGEAL ECHOCARDIOGRAM (TEE);  Surgeon: [name], MD;  Location: [facility];  Service: Open Heart Surgery;  Laterality: N/A;  ? TONSILLECTOMY  age 59  ? TUBAL LIGATION  many yrs ago   ? ? ?Allergies  ?Allergen Reactions  ? Apremilast Other (See Comments)  ?  unknown  ? Latex Itching, Swelling and Rash  ? ? ?Outpatient Encounter Medications as of 06/07/2021  ?Medication Sig  ? acetaminophen (TYLENOL) 325 MG tablet Take 650 mg by mouth every 8 (eight) hours as needed for moderate pain or headache. Give 2 tablets = '1300mg'$  po qAM  ? Amino Acids-Protein Hydrolys (FEEDING SUPPLEMENT, PRO-STAT SUGAR FREE 64,) LIQD Take 30 mLs by mouth in the morning and at bedtime.  ? amLODipine (NORVASC) 2.5 MG tablet Take 1 tablet (2.5 mg total) by mouth daily.  ? aspirin EC 81 MG EC tablet Take 1 tablet (81 mg total) by mouth daily.  ? atorvastatin (LIPITOR) 80 MG tablet TAKE 1 TABLET EVERY DAY  AT  6PM  ? B Complex-C (B-COMPLEX WITH  VITAMIN C) tablet Place 1 tablet into feeding tube daily.  ? cholecalciferol (VITAMIN D) 25 MCG (1000 UNIT) tablet Take 1,000 Units by mouth in the morning and at bedtime.  ? escitalopram (LEXAPRO) 10 MG tablet Take 10 mg by mouth daily.  ? Evolocumab with Infusor ([place]) 420 MG/3.5ML SOCT Inject 420 mg into the skin every 30 (thirty) days.  ? feeding supplement (ENSURE ENLIVE / ENSURE PLUS) LIQD Take 237 mLs by mouth 2 (two) times daily between meals.  ? gabapentin (NEURONTIN) 100 MG capsule Take 100 mg by mouth 2 (two) times daily.  ? guaiFENesin (MUCINEX) 600 MG 12 hr tablet Take 600 mg by mouth 2 (two) times daily as needed (congestion.).  ? Insulin Aspart FlexPen (NOVOLOG) 100 UNIT/ML INJECT 5 UNITS SUBCUTANEOUSLY DAILY BEFORE EACH MEAL FOR CBG >/=300 (PRIME PEN W/2 UNITS PRIOR TO EACH USE)  ? metFORMIN (GLUCOPHAGE) 500 MG tablet Take 1,000 mg by mouth 2 (two) times daily.  ? metoprolol tartrate (LOPRESSOR) 25 MG tablet Take 1 tablet (25 mg total) by mouth 2 (two) times daily.  ? nitroGLYCERIN (NITROSTAT) 0.4 MG SL tablet DISSOLVE 1 TABLET UNDER THE TONGUE EVERY 5 MINUTES AS  NEEDED FOR CHEST PAIN. MAX  OF 3 TABLETS IN 15 MINUTES. CALL 911 IF PAIN PERSISTS.  ?  Nutritional Supplements (NUTRITIONAL SUPPLEMENT PO) Take by mouth. One Ensure( 261m) or Boost (2321m or 12012med Pass daily- chocolate if available  ? ondansetron (ZOFRAN-ODT) 4 MG disintegrating tablet Take 4 mg by m

## 2021-06-13 ENCOUNTER — Encounter: Payer: Self-pay | Admitting: Physician Assistant

## 2021-06-13 ENCOUNTER — Ambulatory Visit (INDEPENDENT_AMBULATORY_CARE_PROVIDER_SITE_OTHER): Payer: Medicare HMO | Admitting: Physician Assistant

## 2021-06-13 VITALS — BP 137/84 | HR 104 | Ht 66.25 in | Wt 156.0 lb

## 2021-06-13 DIAGNOSIS — I25810 Atherosclerosis of coronary artery bypass graft(s) without angina pectoris: Secondary | ICD-10-CM | POA: Diagnosis not present

## 2021-06-13 DIAGNOSIS — E785 Hyperlipidemia, unspecified: Secondary | ICD-10-CM | POA: Diagnosis not present

## 2021-06-13 DIAGNOSIS — R000 Tachycardia, unspecified: Secondary | ICD-10-CM

## 2021-06-13 DIAGNOSIS — I10 Essential (primary) hypertension: Secondary | ICD-10-CM | POA: Diagnosis not present

## 2021-06-13 DIAGNOSIS — E119 Type 2 diabetes mellitus without complications: Secondary | ICD-10-CM

## 2021-06-13 NOTE — Progress Notes (Signed)
?Cardiology Office Note:   ? ?Date:  06/13/2021  ? ?ID:  [name] [name], DOB November 16, 1960, MRN [id number] ? ?PCP:  Leeroy Cha, MD ?  ?[facility] Providers ?Cardiologist:  [name], MD    ? ?Referring MD: Leeroy Cha,*  ? ?No chief complaint on file. ? ? ?History of Present Illness:   ? ?[name] [name] is a 61 y.o. female with a hx of CAD s/ CABG 2017, HTN, HLD, DM II, lupus, and psoriatic arthritis.  Patient initially hospitalized with NSTEMI in April 2017, cardiac catheterization revealed severe three-vessel disease.  She ultimately underwent CABG x3 with LIMA-LAD, SVG-RCA, SVG-OM 2.  Postprocedure, she was discharged on aspirin and Plavix.  She returned back to the hospital in July 2017 with complaint of chest discomfort and noted to have ST elevation in the inferior and lateral leads.  Emergent cardiac catheterization revealed patent LIMA to LAD, occluded SVG to OM, occluded SVG to RCA, distal RCA had 99% stenosis, she ultimately underwent Promus DES to distal RCA.  Plavix switched to Brilinta.  Echocardiogram showed EF 55 to 60%, inferior hypokinesis.  Her Zocor was switched to Lipitor.  ? ?Patient was seen in December 2018 with atypical lateral chest pain that is worse with deep breathing.  Troponin was negative.  Echocardiogram showed EF 45 to 50%, this was reviewed by Dr. Martinique who felt there was no significant change when compared to the previous echo.  Patient was admitted in January 2022 with accelerated hypertension after presenting with chest pain.  Serial troponin was borderline elevated at 23-->29.  She was started on amlodipine, Imdur was increased. She was last seen by Dr. Martinique on 10/31/2020 at which time she was doing well.  She was still having atypical chest discomfort however it was felt that unlikely to be cardiac in nature.  Since the last visit, patient has been to the emergency room several times since January 2023.  She was seen for chest pain on 04/06/2021 that  woke her up from sleep.  Troponin negative x2.  She was treated with sucralfate.  ? ?More recently, patient was admitted to the hospital on 04/30/2021 with altered mental status and was lying partially in bed.  She was found by her son.  On arrival, she was hypotensive concerning for sepsis. She spent 9 days in the hospital.  Images revealed an empyema, patient underwent thoracentesis on 05/01/2021.  Afterward, she was treated with antibiotics.  Hospital course was complicated by AKI and thrombocytopenia which was felt to be reactive to sepsis.  CT soft tissue of the neck performed on 2/1 revealed thyroid nodule, it was deferred to PCP to consider FNA as outpatient.  Echocardiogram obtained during the hospitalization on 05/01/2021 revealed EF 45%, regional wall motion abnormality with basal to mid inferior, basal to mid inferolateral akinesis suggestive of prior MI, grade 1 DD, RVSP 35.5 mmHg, grade 1 DD.  She was discharged on 2 weeks of Augmentin. ? ?I last saw the patient on 05/22/2021, her initial EKG was interpreted as acute MI, however I reviewed the EKG with DOD without Q waves in the inferior lead is old and there was no evidence of acute MI.  Patient denied any chest discomfort in the office.  Dr. Claiborne Billings recommended addition of low-dose beta-blocker given tachycardia.  We decreased her amlodipine to 2.5 mg daily.  We obtained a CBC which shows no elevation of the white blood cell count to explain the elevated heart rate. ? ?Patient presents today for follow-up.  She has intermittent chest discomfort related to acid reflux however not with exertion.  She is continuing to work through physical therapy.  She still has weakness especially in her left upper extremity and the right lower extremity.  Heart rate improved after addition of metoprolol, however still borderline high.  I suspect her heart rate will continue to settle down in the future with resolution of deconditioning.  I recommend a 3 to 50-monthfollow-up  with Dr. JMartinique ? ? ?Past Medical History:  ?Diagnosis Date  ? Acute ST elevation myocardial infarction (STEMI) involving right coronary artery [place] 10/04/[place]  ? x 2  ? Anxiety   ? Anxiety and depression   ? CAD (coronary artery disease)   ? a. CABG 06/2015: LIMA-LAD, SVG-RCA, SVG-OM2 b. STEMI s/p PCI to distal RCA lesion with a small Promus Premier stent. ( patent LIMA to the LAD, occluded SVG to OM, occluded SVG to RCA.)  ? CAD (coronary artery disease) of artery bypass graft; occluded SVG to RCA and occluded SVG to OM 10/08/2015  ? Empty sella ([place]   ? GERD (gastroesophageal reflux disease)   ? Headache   ? from stress  ? Hematuria wed 06-13-2020  ? Hidradenitis axillaris   ? History of kidney stones   ? Hyperlipemia   ? Hypertension   ? [place]upus [place]  ? "they think I have this; have to get retested" (06/12/2015)  ? Obesity (BMI 30.0-34.9)   ? Pneumonia X 2  ? last time yrs ago  ? PONV (postoperative nausea and vomiting)   ? likes iv med or scopolamine patch works well  ? Psoriasis 06/15/2020  ? hands elbows, ears, small amount on scalp and bridge of nose  ? Rheumatoid aortitis   ? psorriatic arthritis  ? Rheumatoid arthritis ([place]   ? S/P angioplasty with stent; 10/04/15 to distal RCA lesion. with Promus premier. 10/08/2015  ? Scoliosis   ? Sleep apnea   ? patient states "it is mild" no cpap needed  ? Stroke ([facility] March 2014  ? left MCA branches; "they say I have them all the time", denies residual on 06/12/2015  ? Tobacco abuse   ? Type II diabetes mellitus ([place]   ? Urinary frequency   ? and urgency  ? Wears glasses   ? for reading  ? ? ?Past Surgical History:  ?Procedure Laterality Date  ? ABDOMINAL HYSTERECTOMY  33 yrs ago  ? total has ovaies  ? BREAST BIOPSY Bilateral yrd ago  ? benign  ? CARDIAC CATHETERIZATION N/A 06/13/2015  ? Procedure: Left Heart Cath and Coronary Angiography;  Surgeon: Peter M JMartinique MD;  [facility];  Service: Cardiovascular;  Laterality: N/A;  ? CARDIAC  CATHETERIZATION N/A 10/04/2015  ? Procedure: Left Heart Cath and Coronary Angiography;  Surgeon: JJettie Booze MD;  [facility];  Service: Cardiovascular;  Laterality: N/A;  ? CARDIAC CATHETERIZATION N/A 10/04/2015  ? Procedure: Coronary Stent Intervention;  Surgeon: JJettie Booze MD;  [facility];  Service: Cardiovascular;  Laterality: N/A;  ? CORONARY ARTERY BYPASS GRAFT N/A 06/19/2015  ? Procedure: CORONARY ARTERY BYPASS GRAFTING (CABG) TIMES FOUR USING LEFT INTERNAL MAMMARY, RIGHT SAPHENOUS LEG VEIN AND CRYO SAPHENOUS VEIN;  Surgeon: PIvin Poot MD;  [facility]  Service: Open Heart Surgery;  Laterality: N/A;  ? CYSTOSCOPY WITH RETROGRADE PYELOGRAM, URETEROSCOPY AND STENT PLACEMENT Left 06/21/2020  ? Procedure: CYSTOSCOPY WITH RETROGRADE PYELOGRAM, URETEROSCOPY, BASKET STONE EXTRACTION AND  STENT PLACEMENT;  Surgeon: Robley Fries, MD;  Location: [facility];  Service: Urology;  Laterality: Left;  ? DILATION AND CURETTAGE OF UTERUS  yrs ago  ? HOLMIUM LASER APPLICATION Left 09/20/7207  ? Procedure: HOLMIUM LASER APPLICATION;  Surgeon: Robley Fries, MD;  Location: [facility];  Service: Urology;  Laterality: Left;  ? HYDRADENITIS EXCISION Right 12/20/2019  ? Procedure: EXCISION OF RIGHT  HIDRADENITIS AXILLARY;  Surgeon: Donnie Mesa, MD;  Location: [facility];  Service: General;  Laterality: Right;  ? IR URETERAL STENT LEFT NEW ACCESS W/O SEP NEPHROSTOMY CATH  01/10/2019  ? NEPHROLITHOTOMY Left 01/10/2019  ? Procedure: NEPHROLITHOTOMY PERCUTANEOUS;  Surgeon: Kathie Rhodes, MD;  [facility];  Service: Urology;  Laterality: Left;  ? TEE WITHOUT CARDIOVERSION N/A 06/19/2015  ? Procedure: TRANSESOPHAGEAL ECHOCARDIOGRAM (TEE);  Surgeon: [name], MD;  Location: [facility];  Service: Open Heart Surgery;  Laterality: N/A;  ? TONSILLECTOMY  age 56  ? TUBAL LIGATION  many yrs ago  ? ? ?Current Medications: ?Current Meds  ?Medication  Sig  ? acetaminophen (TYLENOL) 325 MG tablet Take 650 mg by mouth every 8 (eight) hours as needed for moderate pain or headache. Give 2 tablets = '1300mg'$  po qAM  ? Amino Acids-Protein Hydrolys (FEEDING SUPPLE

## 2021-06-13 NOTE — Patient Instructions (Signed)
Medication Instructions:  ?Your physician recommends that you continue on your current medications as directed. Please refer to the Current Medication list given to you today. ? ?*If you need a refill on your cardiac medications before your next appointment, please call your pharmacy* ? ?Lab Work: ?NONE ordered at this time of appointment  ? ?If you have labs (blood work) drawn today and your tests are completely normal, you will receive your results only by: ?MyChart Message (if you have MyChart) OR ?A paper copy in the mail ?If you have any lab test that is abnormal or we need to change your treatment, we will call you to review the results. ? ?Testing/Procedures: ?NONE ordered at this time of appointment  ? ?Follow-Up: ?At [facility], you and your health needs are our priority.  As part of our continuing mission to provide you with exceptional heart care, we have created designated Provider Care Teams.  These Care Teams include your primary Cardiologist (physician) and Advanced Practice Providers (APPs -  Physician Assistants and Nurse Practitioners) who all work together to provide you with the care you need, when you need it. ? ?Your next appointment:   ?3-4 month(s) ? ?The format for your next appointment:   ?In Person ? ?Provider:   ?[name], MD   ? ?Other Instructions ? ? ?

## 2021-06-16 LAB — ACID FAST CULTURE WITH REFLEXED SENSITIVITIES (MYCOBACTERIA): Acid Fast Culture: NEGATIVE

## 2021-06-20 ENCOUNTER — Encounter: Payer: Self-pay | Admitting: Adult Health

## 2021-06-20 ENCOUNTER — Non-Acute Institutional Stay (SKILLED_NURSING_FACILITY): Payer: Medicare HMO | Admitting: Adult Health

## 2021-06-20 DIAGNOSIS — A419 Sepsis, unspecified organism: Secondary | ICD-10-CM

## 2021-06-20 DIAGNOSIS — R000 Tachycardia, unspecified: Secondary | ICD-10-CM

## 2021-06-20 DIAGNOSIS — E785 Hyperlipidemia, unspecified: Secondary | ICD-10-CM

## 2021-06-20 DIAGNOSIS — G9340 Encephalopathy, unspecified: Secondary | ICD-10-CM

## 2021-06-20 DIAGNOSIS — I10 Essential (primary) hypertension: Secondary | ICD-10-CM

## 2021-06-20 DIAGNOSIS — I2510 Atherosclerotic heart disease of native coronary artery without angina pectoris: Secondary | ICD-10-CM | POA: Diagnosis not present

## 2021-06-20 DIAGNOSIS — E1140 Type 2 diabetes mellitus with diabetic neuropathy, unspecified: Secondary | ICD-10-CM | POA: Diagnosis not present

## 2021-06-20 DIAGNOSIS — G629 Polyneuropathy, unspecified: Secondary | ICD-10-CM | POA: Diagnosis not present

## 2021-06-20 DIAGNOSIS — L4050 Arthropathic psoriasis, unspecified: Secondary | ICD-10-CM

## 2021-06-20 DIAGNOSIS — Z794 Long term (current) use of insulin: Secondary | ICD-10-CM

## 2021-06-20 DIAGNOSIS — F339 Major depressive disorder, recurrent, unspecified: Secondary | ICD-10-CM | POA: Diagnosis not present

## 2021-06-20 DIAGNOSIS — R6521 Severe sepsis with septic shock: Secondary | ICD-10-CM

## 2021-06-20 DIAGNOSIS — E042 Nontoxic multinodular goiter: Secondary | ICD-10-CM

## 2021-06-20 MED ORDER — GUAIFENESIN ER 600 MG PO TB12: 600.0000 mg | ORAL_TABLET | Freq: Two times a day (BID) | ORAL | 0 refills | Status: DC | PRN

## 2021-06-20 MED ORDER — AMLODIPINE BESYLATE 2.5 MG PO TABS: 2.5000 mg | ORAL_TABLET | Freq: Every day | ORAL | 0 refills | Status: DC

## 2021-06-20 MED ORDER — INSULIN ASPART FLEXPEN 100 UNIT/ML ~~LOC~~ SOPN: 5.0000 [IU] | PEN_INJECTOR | Freq: Three times a day (TID) | SUBCUTANEOUS | 0 refills | Status: DC

## 2021-06-20 MED ORDER — NITROGLYCERIN 0.4 MG SL SUBL: SUBLINGUAL_TABLET | SUBLINGUAL | 0 refills | Status: AC

## 2021-06-20 MED ORDER — METFORMIN HCL 500 MG PO TABS: 500.0000 mg | ORAL_TABLET | Freq: Two times a day (BID) | ORAL | 0 refills | Status: DC

## 2021-06-20 MED ORDER — TRAMADOL HCL 50 MG PO TABS: 50.0000 mg | ORAL_TABLET | Freq: Three times a day (TID) | ORAL | 0 refills | Status: DC | PRN

## 2021-06-20 MED ORDER — ONDANSETRON 4 MG PO TBDP: 4.0000 mg | ORAL_TABLET | Freq: Three times a day (TID) | ORAL | 0 refills | Status: AC | PRN

## 2021-06-20 MED ORDER — ESCITALOPRAM OXALATE 10 MG PO TABS: 10.0000 mg | ORAL_TABLET | Freq: Every day | ORAL | 0 refills | Status: DC

## 2021-06-20 MED ORDER — GABAPENTIN 100 MG PO CAPS: 100.0000 mg | ORAL_CAPSULE | Freq: Two times a day (BID) | ORAL | 0 refills | Status: DC

## 2021-06-20 MED ORDER — ATORVASTATIN CALCIUM 80 MG PO TABS: ORAL_TABLET | ORAL | 0 refills | Status: DC

## 2021-06-20 MED ORDER — METOPROLOL TARTRATE 25 MG PO TABS: 25.0000 mg | ORAL_TABLET | Freq: Two times a day (BID) | ORAL | 0 refills | Status: DC

## 2021-06-20 MED ORDER — PANTOPRAZOLE SODIUM 40 MG PO TBEC
40.0000 mg | DELAYED_RELEASE_TABLET | Freq: Every day | ORAL | 0 refills | Status: DC
Start: 2021-06-20 — End: 2021-08-26

## 2021-06-20 NOTE — Progress Notes (Signed)
? ?Location:  [facility] ?Nursing Home Room Number: 216 A ?Place of Service:  SNF (31) ?Provider:  Durenda Age, DNP, FNP-BC ? ?Patient Care Team: ?Leeroy Cha, MD as PCP - General (Internal Medicine) ?Martinique, Peter M, MD as PCP - Cardiology (Cardiology) ? ?Extended Emergency Contact Information ?Primary Emergency Contact: Reinitz,[name] ?Address: [address] ?         [place], [place] [address] [place] ?Mobile Phone: [phone] ?Relation: Son ?Secondary Emergency Contact: Nored,Ronald ?Mobile Phone: [phone] ?Relation: Son ? ?Code Status:   Full Code ? ?Goals of care: Advanced Directive information ? ?  06/07/2021  ?  1:16 PM  ?Advanced Directives  ?Does Patient Have a Medical Advance Directive? Yes  ?Type of Advance Directive Out of facility DNR (pink MOST or yellow form)  ?Does patient want to make changes to medical advance directive? No - Patient declined  ?Pre-existing out of facility DNR order (yellow form or pink MOST form) Pink MOST form placed in chart (order not valid for inpatient use)  ? ? ? ?Chief Complaint  ?Patient presents with  ? Discharge Note  ?  For discharge home on 06/22/21 with Home health  PT, OT and Aide  ? ? ?HPI:  ?[name] who is for discharge home on 06/22/21 with [place], OT and Aide. ? ?She was admitted to [facility] on 05/09/21 post hospital admission 04/30/21 to 05/09/21. She has a PMH of psriatic arthritis, diabetes mellitus type 2, hypertension and CAD s/p CABG. She presented to the hospital on 04/30/21 with confusion, hypotension and fever. She was found to have septic shock due to empyema. She underwent thoracentesis on 05/01/21. She was initially started on Zosyn then transitioned to Augmentin X 3 weeks per ID recommendation, Dr. Juleen China. ? ?Patient was admitted to this facility for short-term rehabilitation after the patient's recent hospitalization.  Patient has completed SNF  rehabilitation and therapy has cleared the patient for discharge. ? ? ? ?Past Medical History:  ?Diagnosis Date  ? Acute ST elevation myocardial infarction (STEMI) involving right coronary artery ([place]) 10/04/2015  ? x 2  ? Anxiety   ? Anxiety and depression   ? CAD (coronary artery disease)   ? a. CABG 06/2015: LIMA-LAD, SVG-RCA, SVG-OM2 b. STEMI s/p PCI to distal RCA lesion with a small Promus Premier stent. ( patent LIMA to the LAD, occluded SVG to OM, occluded SVG to RCA.)  ? CAD (coronary artery disease) of artery bypass graft; occluded SVG to RCA and occluded SVG to OM 10/08/2015  ? Empty sella ([place])   ? GERD (gastroesophageal reflux disease)   ? Headache   ? from stress  ? Hematuria wed 06-13-2020  ? Hidradenitis axillaris   ? History of kidney stones   ? Hyperlipemia   ? Hypertension   ? Lupus ([place]) dx'd 2008  ? "they think I have this; have to get retested" (06/12/2015)  ? Obesity (BMI 30.0-34.9)   ? Pneumonia X 2  ? last time yrs ago  ? PONV (postoperative nausea and vomiting)   ? likes iv med or scopolamine patch works well  ? Psoriasis 06/15/2020  ? hands elbows, ears, small amount on scalp and bridge of nose  ? Rheumatoid aortitis   ? psorriatic arthritis  ? Rheumatoid arthritis ([place])   ? S/P angioplasty with stent; 10/04/15 to distal RCA lesion. with Promus premier. 10/08/2015  ? Scoliosis   ? Sleep apnea   ? patient states "it  is mild" no cpap needed  ? Stroke [facility]) March 2014  ? left MCA branches; "they say I have them all the time", denies residual on 06/12/2015  ? Tobacco abuse   ? Type II diabetes mellitus ([place])   ? Urinary frequency   ? and urgency  ? Wears glasses   ? for reading  ? ?Past Surgical History:  ?Procedure Laterality Date  ? ABDOMINAL HYSTERECTOMY  33 yrs ago  ? total has ovaies  ? BREAST BIOPSY Bilateral yrd ago  ? benign  ? CARDIAC CATHETERIZATION N/A 06/13/2015  ? Procedure: Left Heart Cath and Coronary Angiography;  Surgeon: Peter M Martinique, MD;  Location: [facility];  Service:  Cardiovascular;  Laterality: N/A;  ? CARDIAC CATHETERIZATION N/A 10/04/2015  ? Procedure: Left Heart Cath and Coronary Angiography;  Surgeon: [name], MD;  Location: [facility];  Service: Cardiovascular;  Laterality: N/A;  ? CARDIAC CATHETERIZATION N/A 10/04/2015  ? Procedure: Coronary Stent Intervention;  Surgeon: [name], MD;  Location: [facility];  Service: Cardiovascular;  Laterality: N/A;  ? CORONARY ARTERY BYPASS GRAFT N/A 06/19/2015  ? Procedure: CORONARY ARTERY BYPASS GRAFTING (CABG) TIMES FOUR USING LEFT INTERNAL MAMMARY, RIGHT SAPHENOUS LEG VEIN AND CRYO SAPHENOUS VEIN;  Surgeon: [name], MD;  Location: [facility];  Service: Open Heart Surgery;  Laterality: N/A;  ? CYSTOSCOPY WITH RETROGRADE PYELOGRAM, URETEROSCOPY AND STENT PLACEMENT Left 06/21/2020  ? Procedure: CYSTOSCOPY WITH RETROGRADE PYELOGRAM, URETEROSCOPY, BASKET STONE EXTRACTION AND  STENT PLACEMENT;  Surgeon: Robley Fries, MD;  Location: [facility];  Service: Urology;  Laterality: Left;  ? DILATION AND CURETTAGE OF UTERUS  yrs ago  ? HOLMIUM LASER APPLICATION Left 06/21/960  ? Procedure: HOLMIUM LASER APPLICATION;  Surgeon: Robley Fries, MD;  [facility];  Service: Urology;  Laterality: Left;  ? HYDRADENITIS EXCISION Right 12/20/2019  ? Procedure: EXCISION OF RIGHT  HIDRADENITIS AXILLARY;  Surgeon: Donnie Mesa, MD;  Location: [facility];  Service: General;  Laterality: Right;  ? IR URETERAL STENT LEFT NEW ACCESS W/O SEP NEPHROSTOMY CATH  01/10/2019  ? NEPHROLITHOTOMY Left 01/10/2019  ? Procedure: NEPHROLITHOTOMY PERCUTANEOUS;  Surgeon: Kathie Rhodes, MD;  [facility];  Service: Urology;  Laterality: Left;  ? TEE WITHOUT CARDIOVERSION N/A 06/19/2015  ? Procedure: TRANSESOPHAGEAL ECHOCARDIOGRAM (TEE);  Surgeon: [name], MD;  Location: [facility];  Service: Open Heart Surgery;  Laterality: N/A;  ? TONSILLECTOMY  age 18  ? TUBAL LIGATION  many yrs ago   ? ? ?Allergies  ?Allergen Reactions  ? Apremilast Other (See Comments)  ?  unknown  ? Latex Itching, Swelling and Rash  ? ? ?Outpatient Encounter Medications as of 06/20/2021  ?Medication Sig  ? acetaminophen (TYLENOL) 325 MG tablet Take 650 mg by mouth every 8 (eight) hours as needed for moderate pain or headache. Give 2 tablets = '1300mg'$  po qAM  ? Amino Acids-Protein Hydrolys (FEEDING SUPPLEMENT, PRO-STAT SUGAR FREE 64,) LIQD Take 30 mLs by mouth in the morning and at bedtime.  ? amLODipine (NORVASC) 2.5 MG tablet Take 1 tablet (2.5 mg total) by mouth daily.  ? aspirin EC 81 MG EC tablet Take 1 tablet (81 mg total) by mouth daily.  ? atorvastatin (LIPITOR) 80 MG tablet TAKE 1 TABLET EVERY DAY  AT  6PM  ? B Complex-C (B-COMPLEX WITH VITAMIN C) tablet Place 1 tablet into feeding tube daily.  ? cholecalciferol (VITAMIN D) 25 MCG (1000 UNIT)  tablet Take 1,000 Units by mouth in the morning and at bedtime.  ? escitalopram (LEXAPRO) 10 MG tablet Take 10 mg by mouth daily.  ? Evolocumab with Infusor ([place]) 420 MG/3.5ML SOCT Inject 420 mg into the skin every 30 (thirty) days.  ? feeding supplement (ENSURE ENLIVE / ENSURE PLUS) LIQD Take 237 mLs by mouth 2 (two) times daily between meals.  ? gabapentin (NEURONTIN) 100 MG capsule Take 100 mg by mouth 2 (two) times daily.  ? guaiFENesin (MUCINEX) 600 MG 12 hr tablet Take 600 mg by mouth 2 (two) times daily as needed (congestion.).  ? Insulin Aspart FlexPen (NOVOLOG) 100 UNIT/ML INJECT 5 UNITS SUBCUTANEOUSLY DAILY BEFORE EACH MEAL FOR CBG >/=300 (PRIME PEN W/2 UNITS PRIOR TO EACH USE)  ? metFORMIN (GLUCOPHAGE) 500 MG tablet Take 1,000 mg by mouth 2 (two) times daily.  ? metoprolol tartrate (LOPRESSOR) 25 MG tablet Take 1 tablet (25 mg total) by mouth 2 (two) times daily.  ? nitroGLYCERIN (NITROSTAT) 0.4 MG SL tablet DISSOLVE 1 TABLET UNDER THE TONGUE EVERY 5 MINUTES AS  NEEDED FOR CHEST PAIN. MAX  OF 3 TABLETS IN 15 MINUTES. CALL 911 IF PAIN PERSISTS.  ?  Nutritional Supplements (NUTRITIONAL SUPPLEMENT PO) Take by mouth. One Ensure( 245m) or Boost (2316m or 12045med Pass daily- chocolate if available  ? ondansetron (ZOFRAN-ODT) 4 MG disintegrating tablet Take 4 mg by mouth

## 2021-07-03 DIAGNOSIS — G894 Chronic pain syndrome: Secondary | ICD-10-CM | POA: Diagnosis not present

## 2021-07-03 DIAGNOSIS — Z8673 Personal history of transient ischemic attack (TIA), and cerebral infarction without residual deficits: Secondary | ICD-10-CM | POA: Diagnosis not present

## 2021-07-03 DIAGNOSIS — Z8709 Personal history of other diseases of the respiratory system: Secondary | ICD-10-CM | POA: Diagnosis not present

## 2021-07-03 DIAGNOSIS — G8191 Hemiplegia, unspecified affecting right dominant side: Secondary | ICD-10-CM | POA: Diagnosis not present

## 2021-07-03 DIAGNOSIS — F4321 Adjustment disorder with depressed mood: Secondary | ICD-10-CM | POA: Diagnosis not present

## 2021-07-03 DIAGNOSIS — Z8619 Personal history of other infectious and parasitic diseases: Secondary | ICD-10-CM | POA: Diagnosis not present

## 2021-07-03 DIAGNOSIS — E1121 Type 2 diabetes mellitus with diabetic nephropathy: Secondary | ICD-10-CM | POA: Diagnosis not present

## 2021-07-03 DIAGNOSIS — I10 Essential (primary) hypertension: Secondary | ICD-10-CM | POA: Diagnosis not present

## 2021-07-03 DIAGNOSIS — E049 Nontoxic goiter, unspecified: Secondary | ICD-10-CM | POA: Diagnosis not present

## 2021-07-03 DIAGNOSIS — K219 Gastro-esophageal reflux disease without esophagitis: Secondary | ICD-10-CM | POA: Diagnosis not present

## 2021-07-05 ENCOUNTER — Ambulatory Visit: Payer: Medicare HMO | Admitting: Podiatry

## 2021-07-09 ENCOUNTER — Telehealth: Payer: Self-pay | Admitting: Cardiology

## 2021-07-09 NOTE — Telephone Encounter (Signed)
Spoke to [place] PT with [facility].She stated she is concerned about patient.Stated her rate  increases to 120 with minimal activity and she is more sob.Stated PCP has ordered a cxr but patient has not had done yet.Stated metoprolol dose was decreased when she was in hospital.Advised I will call patient and schedule her a sooner appointment with Dr.Jordan. ? ?Spoke to patient appointment scheduled with Dr.Jordan 4/27 at 4:20 pm.Stated she will try and have cxr done tomorrow at [facility] office.Stated she has lost weight.She has swelling in both lower legs and feet.Right is worse.Dr.Jordan is out of office this afternoon.I will make him aware. ?

## 2021-07-09 NOTE — Telephone Encounter (Signed)
New Message: ? ? ? ? ?Please call, she needs to give you an update on the patient please. ?

## 2021-07-10 ENCOUNTER — Other Ambulatory Visit: Payer: Self-pay | Admitting: Internal Medicine

## 2021-07-10 ENCOUNTER — Ambulatory Visit
Admission: RE | Admit: 2021-07-10 | Discharge: 2021-07-10 | Disposition: A | Discharge status: Home / Self Care | Payer: Medicare HMO | Source: Ambulatory Visit | Attending: Internal Medicine | Admitting: Internal Medicine

## 2021-07-10 DIAGNOSIS — J90 Pleural effusion, not elsewhere classified: Secondary | ICD-10-CM | POA: Diagnosis not present

## 2021-07-10 DIAGNOSIS — Z8709 Personal history of other diseases of the respiratory system: Secondary | ICD-10-CM

## 2021-07-10 DIAGNOSIS — R0602 Shortness of breath: Secondary | ICD-10-CM | POA: Diagnosis not present

## 2021-07-11 ENCOUNTER — Ambulatory Visit: Payer: Medicare HMO | Admitting: Cardiology

## 2021-07-11 NOTE — Progress Notes (Deleted)
Cardiology Office Note:    Date:  07/11/2021   ID:  [name] [name], DOB 1960-04-16, MRN [id number]  PCP:  Leeroy Cha, MD   [facility] Cardiologist:  Shacara Cozine Martinique, MD     Referring MD: Leeroy Cha,*   No chief complaint on file.   History of Present Illness:    [name] [name] is a 61 y.o. female is seen for evaluation of tachycardia and SOB. She has a hx of CAD s/ CABG 2017, HTN, HLD, DM II, lupus, and psoriatic arthritis.  Patient initially hospitalized with NSTEMI in April 2017, cardiac catheterization revealed severe three-vessel disease.  She ultimately underwent CABG x3 with LIMA-LAD, SVG-RCA, SVG-OM 2.  Postprocedure, she was discharged on aspirin and Plavix.  She returned back to the hospital in July 2017 with complaint of chest discomfort and noted to have ST elevation in the inferior and lateral leads.  Emergent cardiac catheterization revealed patent LIMA to LAD, occluded SVG to OM, occluded SVG to RCA, distal RCA had 99% stenosis, she ultimately underwent Promus DES to distal RCA.  Plavix switched to Brilinta.  Echocardiogram showed EF 55 to 60%, inferior hypokinesis.  Her Zocor was switched to Lipitor.   Patient was seen in December 2018 with atypical lateral chest pain that is worse with deep breathing.  Troponin was negative.  Echocardiogram showed EF 45 to 50%,  no significant change when compared to the previous echo.  Patient was admitted in January 2022 with accelerated hypertension after presenting with chest pain.  Serial troponin was borderline elevated at 23-->29.  She was started on amlodipine, Imdur was increased. She was seen 10/31/2020 at which time she was doing well.  She was still having atypical chest discomfort however it was felt that unlikely to be cardiac in nature.  Since the last visit, patient has been to the emergency room several times since January 2023.  She was seen for chest pain on 04/06/2021 that woke her up from  sleep.  Troponin negative x2.  She was treated with sucralfate.   She was admitted to the hospital on 04/30/2021 with altered mental status and was lying partially in bed.  She was found by her son.  On arrival, she was hypotensive concerning for sepsis. She spent 9 days in the hospital.  Images revealed an empyema, patient underwent thoracentesis on 05/01/2021.  Afterward, she was treated with antibiotics.  Hospital course was complicated by AKI and thrombocytopenia which was felt to be reactive to sepsis.  CT soft tissue of the neck performed on 2/1 revealed thyroid nodule, it was deferred to PCP to consider FNA as outpatient.  Echocardiogram obtained during the hospitalization on 05/01/2021 revealed EF 45%, regional wall motion abnormality with basal to mid inferior, basal to mid inferolateral akinesis suggestive of prior MI, grade 1 DD, RVSP 35.5 mmHg, grade 1 DD.  She was discharged on 2 weeks of Augmentin.  Was seen by Almyra Deforest [name] on 05/22/2021, her initial EKG was interpreted as acute MI, however I reviewed the EKG with DOD without Q waves in the inferior lead is old and there was no evidence of acute MI.  Patient denied any chest discomfort in the office.  Dr. Claiborne Billings recommended addition of low-dose beta-blocker given tachycardia.  Her amlodipine was reduced  to 2.5 mg daily.   CBC showed no elevation of the white blood cell count to explain the elevated heart rate.     Past Medical History:  Diagnosis Date   Acute ST  elevation myocardial infarction (STEMI) involving right coronary artery ([facility]) 10/04/2015   x 2   Anxiety    Anxiety and depression    CAD (coronary artery disease)    a. CABG 06/2015: LIMA-LAD, SVG-RCA, SVG-OM2 b. STEMI s/p PCI to distal RCA lesion with a small Promus Premier stent. ( patent LIMA to the LAD, occluded SVG to OM, occluded SVG to RCA.)   CAD (coronary artery disease) of artery bypass graft; occluded SVG to RCA and occluded SVG to OM 10/08/2015   Empty sella (HCC)     GERD (gastroesophageal reflux disease)    Headache    from stress   Hematuria wed 06-13-2020   Hidradenitis axillaris    History of kidney stones    Hyperlipemia    Hypertension    Lupus ([place]) dx'd 2008   "they think I have this; have to get retested" (06/12/2015)   Obesity (BMI 30.0-34.9)    Pneumonia X 2   last time yrs ago   PONV (postoperative nausea and vomiting)    likes iv med or scopolamine patch works well   Psoriasis 06/15/2020   hands elbows, ears, small amount on scalp and bridge of nose   Rheumatoid aortitis    psorriatic arthritis   Rheumatoid arthritis ([place])    S/P angioplasty with stent; 10/04/15 to distal RCA lesion. with Promus premier. 10/08/2015   Scoliosis    Sleep apnea    patient states "it is mild" no cpap needed   Stroke [facility] [place]) March 2014   left MCA branches; "they say I have them all the time", denies residual on 06/12/2015   Tobacco abuse    Type II diabetes mellitus ([place])    Urinary frequency    and urgency   Wears glasses    for reading    Past Surgical History:  Procedure Laterality Date   ABDOMINAL HYSTERECTOMY  31 yrs ago   total has ovaies   BREAST BIOPSY Bilateral yrd ago   benign   CARDIAC CATHETERIZATION N/A 06/13/2015   Procedure: Left Heart Cath and Coronary Angiography;  Surgeon: Kristina Mcnorton M Martinique, MD;  Location: [facility];  Service: Cardiovascular;  Laterality: N/A;   CARDIAC CATHETERIZATION N/A 10/04/2015   Procedure: Left Heart Cath and Coronary Angiography;  Surgeon: [name], MD;  Location: [facility];  Service: Cardiovascular;  Laterality: N/A;   CARDIAC CATHETERIZATION N/A 10/04/2015   Procedure: Coronary Stent Intervention;  Surgeon: [name], MD;  Location: [facility];  Service: Cardiovascular;  Laterality: N/A;   CORONARY ARTERY BYPASS GRAFT N/A 06/19/2015   Procedure: CORONARY ARTERY BYPASS GRAFTING (CABG) TIMES FOUR USING LEFT INTERNAL MAMMARY, RIGHT SAPHENOUS LEG VEIN AND CRYO SAPHENOUS  VEIN;  Surgeon: [name], MD;  Location: [facility];  Service: Open Heart Surgery;  Laterality: N/A;   CYSTOSCOPY WITH RETROGRADE PYELOGRAM, URETEROSCOPY AND STENT PLACEMENT Left 06/21/2020   Procedure: CYSTOSCOPY WITH RETROGRADE PYELOGRAM, URETEROSCOPY, BASKET STONE EXTRACTION AND  STENT PLACEMENT;  Surgeon: Robley Fries, MD;  Location: [facility];  Service: Urology;  Laterality: Left;   DILATION AND CURETTAGE OF UTERUS  yrs ago   HOLMIUM LASER APPLICATION Left 07/22/8467   Procedure: HOLMIUM LASER APPLICATION;  Surgeon: Robley Fries, MD;  Location: [facility];  Service: Urology;  Laterality: Left;   HYDRADENITIS EXCISION Right 12/20/2019   Procedure: EXCISION OF RIGHT  HIDRADENITIS AXILLARY;  Surgeon: Donnie Mesa, MD;  Location: [facility];  Service: General;  Laterality:  Right;   IR URETERAL STENT LEFT NEW ACCESS W/O SEP NEPHROSTOMY CATH  01/10/2019   NEPHROLITHOTOMY Left 01/10/2019   Procedure: NEPHROLITHOTOMY PERCUTANEOUS;  Surgeon: Kathie Rhodes, MD;  Location: [facility];  Service: Urology;  Laterality: Left;   TEE WITHOUT CARDIOVERSION N/A 06/19/2015   Procedure: TRANSESOPHAGEAL ECHOCARDIOGRAM (TEE);  Surgeon: [name], MD;  Location: [facility];  Service: Open Heart Surgery;  Laterality: N/A;   TONSILLECTOMY  age 6   TUBAL LIGATION  many yrs ago    Current Medications: No outpatient medications have been marked as taking for the 07/11/21 encounter (Appointment) with Martinique, Anubis Fundora M, MD.     Allergies:   Apremilast and Latex   Social History   Socioeconomic History   Marital status: Widowed    Spouse name: Not on file   Number of children: 2   Years of education: college   Highest education level: Some college, no degree  Occupational History   Occupation: Disability  Tobacco Use   Smoking status: Former    Packs/day: 0.50    Years: 36.00    Pack years: 18.00    Types: Cigarettes    Quit date: 06/12/2014    Years since quitting: 7.0    Smokeless tobacco: Never  Vaping Use   Vaping Use: Never used  Substance and Sexual Activity   Alcohol use: No    Alcohol/week: 0.0 standard drinks   Drug use: No   Sexual activity: Not Currently  Other Topics Concern   Not on file  Social History Narrative   Patient currently lives at home with her disabled husband who was shot in 2009 and he is total care. She lives with her son and daughter and there is an 8 that comes in. Patient currently is on disability   She occasionally drinks caffeine.    Social Determinants of Radio broadcast assistant Strain: Not on file  Food Insecurity: Food Insecurity Present   Worried About Charity fundraiser in the Last Year: Sometimes true   Arboriculturist in the Last Year: Sometimes true  Transportation Needs: Public librarian (Medical): No   Lack of Transportation (Non-Medical): Yes  Physical Activity: Not on file  Stress: Not on file  Social Connections: Not on file     Family History: The patient's family history includes Alzheimer's disease in an other family member; Breast cancer in her mother and another family member; Depression in her son; Diabetes in her mother and another family member; Heart failure in her mother; Stroke in her maternal aunt.  ROS:   Please see the history of present illness.     All other systems reviewed and are negative.  EKGs/Labs/Other Studies Reviewed:    The following studies were reviewed today:  Echo 05/01/2021 1. Left ventricular ejection fraction, by estimation, is 45%. The left  ventricle has mildly decreased function. The left ventricle demonstrates  regional wall motion abnormalities with basal to mid inferior and basal to  mid inferolateral akinesis  suggestive of prior MI. There is moderate left ventricular hypertrophy.  Left ventricular diastolic parameters are consistent with Grade I  diastolic dysfunction (impaired relaxation).   2. Right ventricular  systolic function is normal. The right ventricular  size is normal. There is normal pulmonary artery systolic pressure. The  estimated right ventricular systolic pressure is 93.7 mmHg.   3. The mitral valve is normal in structure. No evidence of mitral valve  regurgitation. No evidence  of mitral stenosis.   4. The aortic valve is tricuspid. Aortic valve regurgitation is not  visualized. Aortic valve sclerosis/calcification is present, without any  evidence of aortic stenosis.   5. The inferior vena cava is normal in size with greater than 50%  respiratory variability, suggesting right atrial pressure of 3 mmHg.   EKG:  EKG is ordered today.  The ekg ordered today demonstrates normal sinus rhythm, no significant ST-T wave changes.  Recent Labs: 04/30/2021: TSH 0.092 05/08/2021: Magnesium 2.1 05/16/2021: ALT 11; BUN 12; Creatinine 0.5; Platelets 334; Potassium 3.8; Sodium 146 05/22/2021: Hemoglobin 12.5  Recent Lipid Panel    Component Value Date/Time   CHOL 128 10/03/2019 1455   TRIG 211 (H) 10/03/2019 1455   HDL 63 10/03/2019 1455   CHOLHDL 2.0 10/03/2019 1455   CHOLHDL 2.9 10/05/2015 0305   VLDL 28 10/05/2015 0305   LDLCALC 32 10/03/2019 1455     Risk Assessment/Calculations:           Physical Exam:    VS:  There were no vitals taken for this visit.    Wt Readings from Last 3 Encounters:  06/20/21 154 lb 3.2 oz (69.9 kg)  06/13/21 156 lb (70.8 kg)  06/07/21 156 lb (70.8 kg)     GEN:  Well nourished, well developed in no acute distress HEENT: Normal NECK: No JVD; No carotid bruits LYMPHATICS: No lymphadenopathy CARDIAC: RRR, no murmurs, rubs, gallops RESPIRATORY:  Clear to auscultation without rales, wheezing or rhonchi  ABDOMEN: Soft, non-tender, non-distended MUSCULOSKELETAL:  No edema; No deformity  SKIN: Warm and dry NEUROLOGIC:  Alert and oriented x 3 PSYCHIATRIC:  Normal affect   ASSESSMENT:    No diagnosis found.  PLAN:    In order of problems listed  above:  Sinus tachycardia: Likely related to his deconditioning after the recent hospitalization for pneumonia.  Heart rate improved after addition of low-dose beta-blocker.  CAD s/p CABG: Denies any significant chest pain  Hypertension: Blood pressure stable  Hyperlipidemia: On Lipitor  DM2: Managed by primary care provider            Medication Adjustments/Labs and Tests Ordered: Current medicines are reviewed at length with the patient today.  Concerns regarding medicines are outlined above.  No orders of the defined types were placed in this encounter.  No orders of the defined types were placed in this encounter.   There are no Patient Instructions on file for this visit.   Signed, Odilon Cass Martinique, MD  07/11/2021 9:38 AM    [facility]

## 2021-07-15 ENCOUNTER — Ambulatory Visit: Payer: Medicare HMO | Admitting: Nurse Practitioner

## 2021-07-15 ENCOUNTER — Telehealth: Payer: Self-pay | Admitting: Cardiology

## 2021-07-15 NOTE — Telephone Encounter (Signed)
Spoke to patient. She states  pain lev is 9 out of 10. Patient states she has not used NTG  at all.   She states moth is dry. Patient sound slightly short of breath. RN  informed patient to hang up and call 911 for assistance. She  should not wait until this afternoon to come to appointment.  ?

## 2021-07-15 NOTE — Telephone Encounter (Signed)
Pt c/o of Chest Pain: STAT if CP now or developed within 24 hours ? ?1. Are you having CP right now? Yes ? ?2. Are you experiencing any other symptoms (ex. SOB, nausea, vomiting, sweating)? Can barely walk, dry throat to the point her tongue is dry, and her face hurts ? ?3. How long have you been experiencing CP? Pretty much all week  ? ?4. Is your CP continuous or coming and going? Continuous  ? ?5. Have you taken Nitroglycerin? No  ? ? ?Transferred to triage due to being STAT.  ??  ?

## 2021-07-15 NOTE — Telephone Encounter (Signed)
Called pt to see if she went to the ED as directed earlier this morning due to chest pain. Pt informed me that she isn't going to the ED, she is resting and has pain in her arms and back. Per pt she has her Nitroglycerin on hand and has taken medication. Pt was advised to call 911 and go to the ED due to her pain. Pt states that she isn't going to wait. Pt was advised again to go to the ED. Pt thanked me for calling and stated she would go to the ED if she felt she needed it.  ?

## 2021-07-15 NOTE — Progress Notes (Deleted)
Office Visit    Patient Name: [name] [name] Date of Encounter: 07/15/2021  Primary Care Provider:  Leeroy Cha, MD Primary Cardiologist:  [name], MD  Chief Complaint    61 year old female with a history of CAD s/p CABG x3 in 2017, hypertension, hyperlipidemia, type 2 diabetes, lupus, stroke, and psoriatic arthritis who presents for follow-up related to elevated heart rate, bilateral lower extremity edema.  Past Medical History    Past Medical History:  Diagnosis Date   Acute ST elevation myocardial infarction (STEMI) involving right coronary artery ([place]) 10/04/2015   x 2   Anxiety    Anxiety and depression    CAD (coronary artery disease)    a. CABG 06/2015: LIMA-LAD, SVG-RCA, SVG-OM2 b. STEMI s/p PCI to distal RCA lesion with a small Promus Premier stent. ( patent LIMA to the LAD, occluded SVG to OM, occluded SVG to RCA.)   CAD (coronary artery disease) of artery bypass graft; occluded SVG to RCA and occluded SVG to OM 10/08/2015   Empty sella (HCC)    GERD (gastroesophageal reflux disease)    Headache    from stress   Hematuria wed 06-13-2020   Hidradenitis axillaris    History of kidney stones    Hyperlipemia    Hypertension    Lupus ([place]) dx'd 2008   "they think I have this; have to get retested" (06/12/2015)   Obesity (BMI 30.0-34.9)    Pneumonia X 2   last time yrs ago   PONV (postoperative nausea and vomiting)    likes iv med or scopolamine patch works well   Psoriasis 06/15/2020   hands elbows, ears, small amount on scalp and bridge of nose   Rheumatoid aortitis    psorriatic arthritis   Rheumatoid arthritis ([place])    S/P angioplasty with stent; 10/04/15 to distal RCA lesion. with Promus premier. 10/08/2015   Scoliosis    Sleep apnea    patient states "it is mild" no cpap needed   Stroke [facility]) March 2014   left MCA branches; "they say I have them all the time", denies residual on 06/12/2015   Tobacco abuse    Type II diabetes mellitus ([place])     Urinary frequency    and urgency   Wears glasses    for reading   Past Surgical History:  Procedure Laterality Date   ABDOMINAL HYSTERECTOMY  31 yrs ago   total has ovaies   BREAST BIOPSY Bilateral yrd ago   benign   CARDIAC CATHETERIZATION N/A 06/13/2015   Procedure: Left Heart Cath and Coronary Angiography;  Surgeon: Peter M Martinique, MD;  Location: [place] [facility];  Service: Cardiovascular;  Laterality: N/A;   CARDIAC CATHETERIZATION N/A 10/04/2015   Procedure: Left Heart Cath and Coronary Angiography;  Surgeon: [name], MD;  Location: [place] [facility];  Service: Cardiovascular;  Laterality: N/A;   CARDIAC CATHETERIZATION N/A 10/04/2015   Procedure: Coronary Stent Intervention;  Surgeon: [name], MD;  Location: [place] [facility];  Service: Cardiovascular;  Laterality: N/A;   CORONARY ARTERY BYPASS GRAFT N/A 06/19/2015   Procedure: CORONARY ARTERY BYPASS GRAFTING (CABG) TIMES FOUR USING LEFT INTERNAL MAMMARY, RIGHT SAPHENOUS LEG VEIN AND CRYO SAPHENOUS VEIN;  Surgeon: [name], MD;  Location: [place];  Service: Open Heart Surgery;  Laterality: N/A;   CYSTOSCOPY WITH RETROGRADE PYELOGRAM, URETEROSCOPY AND STENT PLACEMENT Left 06/21/2020   Procedure: CYSTOSCOPY WITH RETROGRADE PYELOGRAM, URETEROSCOPY, BASKET STONE EXTRACTION AND  STENT PLACEMENT;  Surgeon:  Robley Fries, MD;  Location: [facility];  Service: Urology;  Laterality: Left;   DILATION AND CURETTAGE OF UTERUS  yrs ago   HOLMIUM LASER APPLICATION Left 07/21/4330   Procedure: HOLMIUM LASER APPLICATION;  Surgeon: Robley Fries, MD;  Location: [facility];  Service: Urology;  Laterality: Left;   HYDRADENITIS EXCISION Right 12/20/2019   Procedure: EXCISION OF RIGHT  HIDRADENITIS AXILLARY;  Surgeon: Donnie Mesa, MD;  Location: [place];  Service: General;  Laterality: Right;   IR URETERAL STENT LEFT NEW ACCESS W/O SEP NEPHROSTOMY CATH  01/10/2019   NEPHROLITHOTOMY Left  01/10/2019   Procedure: NEPHROLITHOTOMY PERCUTANEOUS;  Surgeon: Kathie Rhodes, MD;  [facility];  Service: Urology;  Laterality: Left;   TEE WITHOUT CARDIOVERSION N/A 06/19/2015   Procedure: TRANSESOPHAGEAL ECHOCARDIOGRAM (TEE);  Surgeon: [name], MD;  Location: [place];  Service: Open Heart Surgery;  Laterality: N/A;   TONSILLECTOMY  age 89   TUBAL LIGATION  many yrs ago    Allergies  Allergies  Allergen Reactions   Apremilast Other (See Comments)    unknown   Latex Itching, Swelling and Rash    History of Present Illness    61 year old female with the above past medical history including CAD s/p CABG x3 in 2017, hypertension, hyperlipidemia, type 2 diabetes, lupus, stroke, and psoriatic arthritis.  She was hospitalized with a non-STEMI in April 2017, cardiac catheterization revealed severe three-vessel disease.  She ultimately underwent CABG x3 (LIMA-LAD, SVG-RCA, SVG-OM2).  Postprocedure she was discharged on aspirin and Plavix.  She returned to the hospital in July 2017 with complaints of chest discomfort, noted to have ST elevation in the inferior and lateral leads.  Emergent cardiac catheterization revealed patent LIMA-LAD, occluded SVG-OM, with dRCA 99% stenosis, she underwent DES-dRCA Plavix was switched to Brilinta.  Echocardiogram showed EF 55 to 60%, inferior hypokinesis.  She again reported atypical chest pain and follow-up in 2018.  Echocardiogram in December 2018 EF 45 to 30%, this was reviewed by Dr. Martinique who felt there was no significant change when compared to previous echo.  She was hospitalized in January 2022 with accelerated hypertension, chest pain.  She was started on amlodipine, Imdur was increased.  She has had several emergency room visits with atypical chest pain since.  She was hospitalized in in February 2023 with altered mental status the setting of empyema, sepsis. Echocardiogram in February 2023 showed EF 45%, regional wall motion abnormality with basal  to mid inferior, basal to mid inferolateral akinesis suggestive of prior MI, G1 DD.  She was discharged to SNF. At her posthospital follow-up visit with cardiology, EKG was interpreted as acute MI, however, this was reviewed with DOD, and was felt to show no evidence of acute MI.  She denied chest discomfort at the time. Beta-blocker was added in the setting of tachycardia. She was last seen in the office on 06/13/2021 and reported intermittent chest discomfort related to acid reflux. She denied exertional symptoms concerning for angina.  Heart rate was borderline elevated.  She was discharged home from SNF on 06/20/2021. Her physical therapist called our office on 07/09/2021 with concern for elevated heart rate (120s) with minimal activity, weight loss, and bilateral lower extremity edema. Chest x-ray per PCP showed persistent small right pleural effusion, underlying atelectasis.  She presents today for follow-up.  Since her last visit  Sinus tachycardia: CAD: Hypertension: Hyperlipidemia: Type 2 diabetes: Disposition:  Home Medications    Current Outpatient Medications  Medication Sig  Dispense Refill   acetaminophen (TYLENOL) 325 MG tablet Take 650 mg by mouth every 8 (eight) hours as needed for moderate pain or headache. Give 2 tablets = '1300mg'$  po qAM     Amino Acids-Protein Hydrolys (FEEDING SUPPLEMENT, PRO-STAT SUGAR FREE 64,) LIQD Take 30 mLs by mouth in the morning and at bedtime.     amLODipine (NORVASC) 2.5 MG tablet Take 1 tablet (2.5 mg total) by mouth daily. 30 tablet 0   aspirin EC 81 MG EC tablet Take 1 tablet (81 mg total) by mouth daily.     atorvastatin (LIPITOR) 80 MG tablet TAKE 1 TABLET EVERY DAY  AT  6PM 30 tablet 0   B Complex-C (B-COMPLEX WITH VITAMIN C) tablet Place 1 tablet into feeding tube daily.     cholecalciferol (VITAMIN D) 25 MCG (1000 UNIT) tablet Take 1,000 Units by mouth in the morning and at bedtime.     escitalopram (LEXAPRO) 10 MG tablet Take 1 tablet (10 mg  total) by mouth at bedtime. 30 tablet 0   Evolocumab with Infusor ([place]) 420 MG/3.5ML SOCT Inject 420 mg into the skin every 30 (thirty) days. 3.6 mL 11   feeding supplement (ENSURE ENLIVE / ENSURE PLUS) LIQD Take 237 mLs by mouth 2 (two) times daily between meals. 237 mL 12   gabapentin (NEURONTIN) 100 MG capsule Take 1 capsule (100 mg total) by mouth 2 (two) times daily. 60 capsule 0   guaiFENesin (MUCINEX) 600 MG 12 hr tablet Take 1 tablet (600 mg total) by mouth 2 (two) times daily as needed (congestion.). 60 tablet 0   Insulin Aspart FlexPen (NOVOLOG) 100 UNIT/ML Inject 5 Units into the skin 3 (three) times daily before meals. INJECT 5 UNITS SUBCUTANEOUSLY DAILY BEFORE EACH MEAL FOR CBG >/=300 (PRIME PEN W/2 UNITS PRIOR TO EACH USE) 3 mL 0   metFORMIN (GLUCOPHAGE) 500 MG tablet Take 1 tablet (500 mg total) by mouth 2 (two) times daily. 60 tablet 0   metoprolol tartrate (LOPRESSOR) 25 MG tablet Take 1 tablet (25 mg total) by mouth 2 (two) times daily. 60 tablet 0   nitroGLYCERIN (NITROSTAT) 0.4 MG SL tablet DISSOLVE 1 TABLET UNDER THE TONGUE EVERY 5 MINUTES AS  NEEDED FOR CHEST PAIN. MAX  OF 3 TABLETS IN 15 MINUTES. CALL 911 IF PAIN PERSISTS. 30 tablet 0   Nutritional Supplements (NUTRITIONAL SUPPLEMENT PO) Take by mouth. One Ensure( 259m) or Boost (2317m or 12073med Pass daily- chocolate if available     ondansetron (ZOFRAN-ODT) 4 MG disintegrating tablet Take 1 tablet (4 mg total) by mouth every 8 (eight) hours as needed. 20 tablet 0   pantoprazole (PROTONIX) 40 MG tablet Take 1 tablet (40 mg total) by mouth daily. 30 tablet 0   thiamine 100 MG tablet Take 2.5 tablets (250 mg total) by mouth daily.     traMADol (ULTRAM) 50 MG tablet Take 1 tablet (50 mg total) by mouth every 8 (eight) hours as needed. 30 tablet 0   No current facility-administered medications for this visit.     Review of Systems    ***.  All other systems reviewed and are otherwise negative except  as noted above.    Physical Exam    VS:  There were no vitals taken for this visit. , BMI There is no height or weight on file to calculate BMI.     GEN: Well nourished, well developed, in no acute distress. HEENT: normal. Neck: Supple, no JVD, carotid bruits, or masses.  Cardiac: RRR, no murmurs, rubs, or gallops. No clubbing, cyanosis, edema.  Radials/DP/PT 2+ and equal bilaterally.  Respiratory:  Respirations regular and unlabored, clear to auscultation bilaterally. GI: Soft, nontender, nondistended, BS + x 4. MS: no deformity or atrophy. Skin: warm and dry, no rash. Neuro:  Strength and sensation are intact. Psych: Normal affect.  Accessory Clinical Findings    ECG personally reviewed by me today - *** - no acute changes.  Lab Results  Component Value Date   WBC 5.2 05/22/2021   HGB 12.5 05/22/2021   HCT 37.5 05/22/2021   MCV 86 05/22/2021   PLT 334 05/16/2021   Lab Results  Component Value Date   CREATININE 0.5 05/16/2021   BUN 12 05/16/2021   NA 146 05/16/2021   K 3.8 05/16/2021   CL 111 (A) 05/16/2021   CO2 23 (A) 05/16/2021   Lab Results  Component Value Date   ALT 11 05/16/2021   AST 22 05/16/2021   ALKPHOS 98 05/16/2021   BILITOT 0.9 05/04/2021   Lab Results  Component Value Date   CHOL 128 10/03/2019   HDL 63 10/03/2019   LDLCALC 32 10/03/2019   TRIG 211 (H) 10/03/2019   CHOLHDL 2.0 10/03/2019    Lab Results  Component Value Date   HGBA1C 6.7 (H) 04/30/2021    Assessment & Plan    1.  ***   Lenna Sciara, NP 07/15/2021, 6:00 AM

## 2021-07-17 ENCOUNTER — Encounter: Payer: Self-pay | Admitting: Pulmonary Disease

## 2021-07-17 ENCOUNTER — Ambulatory Visit: Payer: Medicare HMO | Admitting: Pulmonary Disease

## 2021-07-17 ENCOUNTER — Ambulatory Visit (INDEPENDENT_AMBULATORY_CARE_PROVIDER_SITE_OTHER): Payer: Medicare HMO

## 2021-07-17 VITALS — BP 104/70 | HR 139 | Temp 97.6°F | Ht 66.0 in | Wt 144.0 lb

## 2021-07-17 DIAGNOSIS — J90 Pleural effusion, not elsewhere classified: Secondary | ICD-10-CM

## 2021-07-17 DIAGNOSIS — J9811 Atelectasis: Secondary | ICD-10-CM | POA: Diagnosis not present

## 2021-07-17 NOTE — Patient Instructions (Addendum)
We will get an x-ray on you today to check on the lungs on the fluid ? ?I will see you in about 4 to 6 weeks ? ?If there is significant fluid buildup, the plan will be to try and drain the fluid again, the last fluid drainage did not suggest infection in the fluid itself  ? ?Call with significant concerns ? ?Follow-up with primary doctor ? ?

## 2021-07-17 NOTE — Progress Notes (Signed)
? ?      ?[name] [name]    [id number]    [birth year] ? ?Primary Care Physician:Varadarajan, Ronie Spies, MD ? ?Referring Physician: Leeroy Cha, MD ?[address] ?[address] ?[place],  [place] [address] ? ?Chief complaint:   ?Patient being seen for shortness of breath ? ?HPI: ? ?Was recently hospitalized for pneumonia, sepsis, empyema ?Had 300 cc 315 cc of purulent fluid drained from the right lung ?X-rays did improve ? ?She did have an x-ray done about a week ago which looks similar to what she had done postthoracentesis in the hospital ? ?No new fevers she claims she is a little bit more short of breath today so we will repeat the chest x-ray today ? ?Fluid from last thoracentesis did not reveal any organism ? ?Recently has felt more short of breath, unable to ambulate comfortably, decreased appetite-this is more long-term. ? ?She does have an underlying history of coronary artery disease, status post CABG in the past-2017 ?History of hypertension, hyperlipidemia, diabetes, lupus, psoriatic arthritis ? ? ?Outpatient Encounter Medications as of 07/17/2021  ?Medication Sig  ? acetaminophen (TYLENOL) 325 MG tablet Take 650 mg by mouth every 8 (eight) hours as needed for moderate pain or headache. Give 2 tablets = '1300mg'$  po qAM  ? Amino Acids-Protein Hydrolys (FEEDING SUPPLEMENT, PRO-STAT SUGAR FREE 64,) LIQD Take 30 mLs by mouth in the morning and at bedtime.  ? amLODipine (NORVASC) 2.5 MG tablet Take 1 tablet (2.5 mg total) by mouth daily.  ? aspirin EC 81 MG EC tablet Take 1 tablet (81 mg total) by mouth daily.  ? atorvastatin (LIPITOR) 80 MG tablet TAKE 1 TABLET EVERY DAY  AT  6PM  ? B Complex-C (B-COMPLEX WITH VITAMIN C) tablet Place 1 tablet into feeding tube daily.  ? cholecalciferol (VITAMIN D) 25 MCG (1000 UNIT) tablet Take 1,000 Units by mouth in the morning and at bedtime.  ? escitalopram (LEXAPRO) 10 MG tablet Take 1 tablet (10 mg total) by mouth at bedtime.  ? Evolocumab with Infusor ([place]) 420 MG/3.5ML SOCT Inject 420 mg into the skin every 30 (thirty) days.  ? feeding supplement (ENSURE ENLIVE / ENSURE PLUS) LIQD Take 237 mLs by mouth 2 (two) times daily between meals.  ? gabapentin (NEURONTIN) 100 MG capsule Take 1 capsule (100 mg total) by mouth 2 (two) times daily.  ? guaiFENesin (MUCINEX) 600 MG 12 hr tablet Take 1 tablet (600 mg total) by mouth 2 (two) times daily as needed (congestion.).  ? Insulin Aspart FlexPen (NOVOLOG) 100 UNIT/ML Inject 5 Units into the skin 3 (three) times daily before meals. INJECT 5 UNITS SUBCUTANEOUSLY DAILY BEFORE EACH MEAL FOR CBG >/=300 (PRIME PEN W/2 UNITS PRIOR TO EACH USE)  ? metFORMIN (GLUCOPHAGE) 500 MG tablet Take 1 tablet (500 mg total) by mouth 2 (two) times daily.  ? metoprolol tartrate (LOPRESSOR) 25 MG tablet Take 1 tablet (25 mg total) by mouth 2 (two) times daily.  ? nitroGLYCERIN (NITROSTAT) 0.4 MG SL tablet DISSOLVE 1 TABLET UNDER THE TONGUE EVERY 5 MINUTES AS  NEEDED FOR CHEST PAIN. MAX  OF 3 TABLETS IN 15 MINUTES. CALL 911 IF PAIN PERSISTS.  ? Nutritional Supplements (NUTRITIONAL SUPPLEMENT PO) Take by mouth. One Ensure( 230m) or Boost (2323m or 12063med Pass daily- chocolate if available  ? ondansetron (ZOFRAN-ODT) 4 MG disintegrating tablet Take 1 tablet (4 mg total) by mouth every 8 (eight) hours as needed.  ? pantoprazole (PROTONIX) 40 MG tablet Take 1 tablet (  40 mg total) by mouth daily.  ? thiamine 100 MG tablet Take 2.5 tablets (250 mg total) by mouth daily.  ? traMADol (ULTRAM) 50 MG tablet Take 1 tablet (50 mg total) by mouth every 8 (eight) hours as needed.  ? ?No facility-administered encounter medications on file as of 07/17/2021.  ? ? ?Allergies as of 07/17/2021 - Review Complete 07/17/2021  ?Allergen Reaction Noted  ? Apremilast Other (See Comments) 11/21/2020  ? Latex Itching, Swelling, and Rash 05/03/2021  ? ? ?Past Medical History:  ?Diagnosis Date  ? Acute ST elevation myocardial infarction (STEMI) involving  right coronary artery ([place]) 10/04/2015  ? x 2  ? Anxiety   ? Anxiety and depression   ? CAD (coronary artery disease)   ? a. CABG 06/2015: LIMA-LAD, SVG-RCA, SVG-OM2 b. STEMI s/p PCI to distal RCA lesion with a small Promus Premier stent. ( patent LIMA to the LAD, occluded SVG to OM, occluded SVG to RCA.)  ? CAD (coronary artery disease) of artery bypass graft; occluded SVG to RCA and occluded SVG to OM 10/08/2015  ? Empty sella ([place])   ? GERD (gastroesophageal reflux disease)   ? Headache   ? from stress  ? Hematuria wed 06-13-2020  ? Hidradenitis axillaris   ? History of kidney stones   ? Hyperlipemia   ? Hypertension   ? Lupus ([place]) dx'd 2008  ? "they think I have this; have to get retested" (06/12/2015)  ? Obesity (BMI 30.0-34.9)   ? Pneumonia X 2  ? last time yrs ago  ? PONV (postoperative nausea and vomiting)   ? likes iv med or scopolamine patch works well  ? Psoriasis 06/15/2020  ? hands elbows, ears, small amount on scalp and bridge of nose  ? Rheumatoid aortitis   ? psorriatic arthritis  ? Rheumatoid arthritis ([place])   ? S/P angioplasty with stent; 10/04/15 to distal RCA lesion. with Promus premier. 10/08/2015  ? Scoliosis   ? Sleep apnea   ? patient states "it is mild" no cpap needed  ? Stroke [facility]) March 2014  ? left MCA branches; "they say I have them all the time", denies residual on 06/12/2015  ? Tobacco abuse   ? Type II diabetes mellitus ([place])   ? Urinary frequency   ? and urgency  ? Wears glasses   ? for reading  ? ? ?Past Surgical History:  ?Procedure Laterality Date  ? ABDOMINAL HYSTERECTOMY  31 yrs ago  ? total has ovaies  ? BREAST BIOPSY Bilateral yrd ago  ? benign  ? CARDIAC CATHETERIZATION N/A 06/13/2015  ? Procedure: Left Heart Cath and Coronary Angiography;  Surgeon: Peter M Martinique, MD;  Location: [place] [facility];  Service: Cardiovascular;  Laterality: N/A;  ? CARDIAC CATHETERIZATION N/A 10/04/2015  ? Procedure: Left Heart Cath and Coronary Angiography;  Surgeon: [name], MD;   Location: [place] [facility];  Service: Cardiovascular;  Laterality: N/A;  ? CARDIAC CATHETERIZATION N/A 10/04/2015  ? Procedure: Coronary Stent Intervention;  Surgeon: [name], MD;  Location: [place] [facility];  Service: Cardiovascular;  Laterality: N/A;  ? CORONARY ARTERY BYPASS GRAFT N/A 06/19/2015  ? Procedure: CORONARY ARTERY BYPASS GRAFTING (CABG) TIMES FOUR USING LEFT INTERNAL MAMMARY, RIGHT SAPHENOUS LEG VEIN AND CRYO SAPHENOUS VEIN;  Surgeon: [name], MD;  Location: [place];  Service: Open Heart Surgery;  Laterality: N/A;  ? CYSTOSCOPY WITH RETROGRADE PYELOGRAM, URETEROSCOPY AND STENT PLACEMENT Left 06/21/2020  ? Procedure: CYSTOSCOPY WITH RETROGRADE PYELOGRAM,  URETEROSCOPY, BASKET STONE EXTRACTION AND  STENT PLACEMENT;  Surgeon: Robley Fries, MD;  Location[facility];  Service: Urology;  Laterality: Left;  ? DILATION AND CURETTAGE OF UTERUS  yrs ago  ? HOLMIUM LASER APPLICATION Left 07/21/8614  ? Procedure: HOLMIUM LASER APPLICATION;  Surgeon: Robley Fries, MD;  Location: [facility];  Service: Urology;  Laterality: Left;  ? HYDRADENITIS EXCISION Right 12/20/2019  ? Procedure: EXCISION OF RIGHT  HIDRADENITIS AXILLARY;  Surgeon: Donnie Mesa, MD;  Location: [place];  Service: General;  Laterality: Right;  ? IR URETERAL STENT LEFT NEW ACCESS W/O SEP NEPHROSTOMY CATH  01/10/2019  ? NEPHROLITHOTOMY Left 01/10/2019  ? Procedure: NEPHROLITHOTOMY PERCUTANEOUS;  Surgeon: Kathie Rhodes, MD;  [facility];  Service: Urology;  Laterality: Left;  ? TEE WITHOUT CARDIOVERSION N/A 06/19/2015  ? Procedure: TRANSESOPHAGEAL ECHOCARDIOGRAM (TEE);  Surgeon: [name], MD;  Location: [place];  Service: Open Heart Surgery;  Laterality: N/A;  ? TONSILLECTOMY  age 20  ? TUBAL LIGATION  many yrs ago  ? ? ?Family History  ?Problem Relation Age of Onset  ? Diabetes Mother   ? Heart failure Mother   ? Breast cancer Mother   ?     60s  ? Stroke Maternal Aunt   ? Depression Son    ? Diabetes Other   ?     maternal  ? Breast cancer Other   ?     2 diff great maternal aunts -  both 45s  ? Alzheimer's disease Other   ?     maternal  ? ? ?Social History  ? ?Socioeconomic History  ? Marital s

## 2021-07-18 ENCOUNTER — Encounter (HOSPITAL_COMMUNITY): Payer: Self-pay | Admitting: Emergency Medicine

## 2021-07-18 ENCOUNTER — Other Ambulatory Visit: Payer: Self-pay | Admitting: Adult Health

## 2021-07-18 ENCOUNTER — Inpatient Hospital Stay (HOSPITAL_COMMUNITY)
Admission: EM | Admit: 2021-07-18 | Discharge: 2021-07-29 | DRG: 641 | Disposition: A | Discharge status: Skilled Nursing Facility | Payer: Medicare HMO | Attending: Family Medicine | Admitting: Family Medicine

## 2021-07-18 ENCOUNTER — Emergency Department (HOSPITAL_COMMUNITY): Payer: Medicare HMO

## 2021-07-18 DIAGNOSIS — K219 Gastro-esophageal reflux disease without esophagitis: Secondary | ICD-10-CM | POA: Diagnosis present

## 2021-07-18 DIAGNOSIS — R7982 Elevated C-reactive protein (CRP): Secondary | ICD-10-CM | POA: Diagnosis not present

## 2021-07-18 DIAGNOSIS — E872 Acidosis, unspecified: Secondary | ICD-10-CM | POA: Diagnosis present

## 2021-07-18 DIAGNOSIS — Z951 Presence of aortocoronary bypass graft: Secondary | ICD-10-CM

## 2021-07-18 DIAGNOSIS — L89152 Pressure ulcer of sacral region, stage 2: Secondary | ICD-10-CM | POA: Diagnosis not present

## 2021-07-18 DIAGNOSIS — I110 Hypertensive heart disease with heart failure: Secondary | ICD-10-CM | POA: Diagnosis present

## 2021-07-18 DIAGNOSIS — J479 Bronchiectasis, uncomplicated: Secondary | ICD-10-CM | POA: Diagnosis present

## 2021-07-18 DIAGNOSIS — I2510 Atherosclerotic heart disease of native coronary artery without angina pectoris: Secondary | ICD-10-CM | POA: Diagnosis present

## 2021-07-18 DIAGNOSIS — B3781 Candidal esophagitis: Secondary | ICD-10-CM | POA: Diagnosis not present

## 2021-07-18 DIAGNOSIS — L89892 Pressure ulcer of other site, stage 2: Secondary | ICD-10-CM | POA: Diagnosis not present

## 2021-07-18 DIAGNOSIS — F419 Anxiety disorder, unspecified: Secondary | ICD-10-CM | POA: Diagnosis present

## 2021-07-18 DIAGNOSIS — Z820 Family history of epilepsy and other diseases of the nervous system: Secondary | ICD-10-CM

## 2021-07-18 DIAGNOSIS — E861 Hypovolemia: Secondary | ICD-10-CM | POA: Diagnosis present

## 2021-07-18 DIAGNOSIS — I5042 Chronic combined systolic (congestive) and diastolic (congestive) heart failure: Secondary | ICD-10-CM | POA: Diagnosis present

## 2021-07-18 DIAGNOSIS — R262 Difficulty in walking, not elsewhere classified: Secondary | ICD-10-CM | POA: Diagnosis not present

## 2021-07-18 DIAGNOSIS — E278 Other specified disorders of adrenal gland: Secondary | ICD-10-CM | POA: Diagnosis present

## 2021-07-18 DIAGNOSIS — R079 Chest pain, unspecified: Secondary | ICD-10-CM | POA: Diagnosis not present

## 2021-07-18 DIAGNOSIS — Z833 Family history of diabetes mellitus: Secondary | ICD-10-CM

## 2021-07-18 DIAGNOSIS — L4050 Arthropathic psoriasis, unspecified: Secondary | ICD-10-CM | POA: Diagnosis present

## 2021-07-18 DIAGNOSIS — Z794 Long term (current) use of insulin: Secondary | ICD-10-CM | POA: Diagnosis not present

## 2021-07-18 DIAGNOSIS — J029 Acute pharyngitis, unspecified: Secondary | ICD-10-CM | POA: Diagnosis not present

## 2021-07-18 DIAGNOSIS — R41841 Cognitive communication deficit: Secondary | ICD-10-CM | POA: Diagnosis not present

## 2021-07-18 DIAGNOSIS — I10 Essential (primary) hypertension: Secondary | ICD-10-CM

## 2021-07-18 DIAGNOSIS — Z8673 Personal history of transient ischemic attack (TIA), and cerebral infarction without residual deficits: Secondary | ICD-10-CM

## 2021-07-18 DIAGNOSIS — N179 Acute kidney failure, unspecified: Secondary | ICD-10-CM | POA: Diagnosis not present

## 2021-07-18 DIAGNOSIS — M6281 Muscle weakness (generalized): Secondary | ICD-10-CM | POA: Diagnosis not present

## 2021-07-18 DIAGNOSIS — R509 Fever, unspecified: Secondary | ICD-10-CM | POA: Diagnosis not present

## 2021-07-18 DIAGNOSIS — E042 Nontoxic multinodular goiter: Secondary | ICD-10-CM | POA: Diagnosis not present

## 2021-07-18 DIAGNOSIS — E519 Thiamine deficiency, unspecified: Secondary | ICD-10-CM | POA: Diagnosis present

## 2021-07-18 DIAGNOSIS — Z87442 Personal history of urinary calculi: Secondary | ICD-10-CM

## 2021-07-18 DIAGNOSIS — R051 Acute cough: Secondary | ICD-10-CM | POA: Diagnosis not present

## 2021-07-18 DIAGNOSIS — R1310 Dysphagia, unspecified: Secondary | ICD-10-CM | POA: Diagnosis present

## 2021-07-18 DIAGNOSIS — E049 Nontoxic goiter, unspecified: Secondary | ICD-10-CM | POA: Diagnosis not present

## 2021-07-18 DIAGNOSIS — E1140 Type 2 diabetes mellitus with diabetic neuropathy, unspecified: Secondary | ICD-10-CM

## 2021-07-18 DIAGNOSIS — R5381 Other malaise: Secondary | ICD-10-CM | POA: Diagnosis present

## 2021-07-18 DIAGNOSIS — F339 Major depressive disorder, recurrent, unspecified: Secondary | ICD-10-CM

## 2021-07-18 DIAGNOSIS — J90 Pleural effusion, not elsewhere classified: Secondary | ICD-10-CM | POA: Diagnosis not present

## 2021-07-18 DIAGNOSIS — R5383 Other fatigue: Secondary | ICD-10-CM | POA: Diagnosis not present

## 2021-07-18 DIAGNOSIS — R278 Other lack of coordination: Secondary | ICD-10-CM | POA: Diagnosis not present

## 2021-07-18 DIAGNOSIS — I493 Ventricular premature depolarization: Secondary | ICD-10-CM | POA: Diagnosis present

## 2021-07-18 DIAGNOSIS — R269 Unspecified abnormalities of gait and mobility: Secondary | ICD-10-CM | POA: Diagnosis not present

## 2021-07-18 DIAGNOSIS — I25708 Atherosclerosis of coronary artery bypass graft(s), unspecified, with other forms of angina pectoris: Secondary | ICD-10-CM

## 2021-07-18 DIAGNOSIS — Z20822 Contact with and (suspected) exposure to covid-19: Secondary | ICD-10-CM | POA: Diagnosis present

## 2021-07-18 DIAGNOSIS — G629 Polyneuropathy, unspecified: Secondary | ICD-10-CM

## 2021-07-18 DIAGNOSIS — I252 Old myocardial infarction: Secondary | ICD-10-CM

## 2021-07-18 DIAGNOSIS — R69 Illness, unspecified: Secondary | ICD-10-CM

## 2021-07-18 DIAGNOSIS — Z7401 Bed confinement status: Secondary | ICD-10-CM

## 2021-07-18 DIAGNOSIS — Z888 Allergy status to other drugs, medicaments and biological substances status: Secondary | ICD-10-CM

## 2021-07-18 DIAGNOSIS — R2681 Unsteadiness on feet: Secondary | ICD-10-CM | POA: Diagnosis not present

## 2021-07-18 DIAGNOSIS — Z7982 Long term (current) use of aspirin: Secondary | ICD-10-CM

## 2021-07-18 DIAGNOSIS — Z818 Family history of other mental and behavioral disorders: Secondary | ICD-10-CM

## 2021-07-18 DIAGNOSIS — R0602 Shortness of breath: Secondary | ICD-10-CM | POA: Diagnosis not present

## 2021-07-18 DIAGNOSIS — D696 Thrombocytopenia, unspecified: Secondary | ICD-10-CM | POA: Diagnosis not present

## 2021-07-18 DIAGNOSIS — E860 Dehydration: Principal | ICD-10-CM | POA: Diagnosis present

## 2021-07-18 DIAGNOSIS — R778 Other specified abnormalities of plasma proteins: Secondary | ICD-10-CM

## 2021-07-18 DIAGNOSIS — Z90710 Acquired absence of both cervix and uterus: Secondary | ICD-10-CM

## 2021-07-18 DIAGNOSIS — E119 Type 2 diabetes mellitus without complications: Secondary | ICD-10-CM | POA: Diagnosis not present

## 2021-07-18 DIAGNOSIS — Z7984 Long term (current) use of oral hypoglycemic drugs: Secondary | ICD-10-CM

## 2021-07-18 DIAGNOSIS — R000 Tachycardia, unspecified: Secondary | ICD-10-CM

## 2021-07-18 DIAGNOSIS — E785 Hyperlipidemia, unspecified: Secondary | ICD-10-CM

## 2021-07-18 DIAGNOSIS — Z955 Presence of coronary angioplasty implant and graft: Secondary | ICD-10-CM

## 2021-07-18 DIAGNOSIS — R7989 Other specified abnormal findings of blood chemistry: Secondary | ICD-10-CM

## 2021-07-18 DIAGNOSIS — Z91040 Latex allergy status: Secondary | ICD-10-CM

## 2021-07-18 DIAGNOSIS — E010 Iodine-deficiency related diffuse (endemic) goiter: Secondary | ICD-10-CM | POA: Diagnosis not present

## 2021-07-18 DIAGNOSIS — M6259 Muscle wasting and atrophy, not elsewhere classified, multiple sites: Secondary | ICD-10-CM | POA: Diagnosis not present

## 2021-07-18 DIAGNOSIS — E870 Hyperosmolality and hypernatremia: Secondary | ICD-10-CM | POA: Diagnosis not present

## 2021-07-18 DIAGNOSIS — G4730 Sleep apnea, unspecified: Secondary | ICD-10-CM | POA: Diagnosis present

## 2021-07-18 DIAGNOSIS — R531 Weakness: Secondary | ICD-10-CM | POA: Diagnosis not present

## 2021-07-18 DIAGNOSIS — F32A Depression, unspecified: Secondary | ICD-10-CM | POA: Diagnosis present

## 2021-07-18 DIAGNOSIS — Z8709 Personal history of other diseases of the respiratory system: Secondary | ICD-10-CM | POA: Diagnosis not present

## 2021-07-18 DIAGNOSIS — R627 Adult failure to thrive: Secondary | ICD-10-CM | POA: Diagnosis not present

## 2021-07-18 DIAGNOSIS — Z8249 Family history of ischemic heart disease and other diseases of the circulatory system: Secondary | ICD-10-CM

## 2021-07-18 DIAGNOSIS — I69391 Dysphagia following cerebral infarction: Secondary | ICD-10-CM | POA: Diagnosis not present

## 2021-07-18 DIAGNOSIS — L8990 Pressure ulcer of unspecified site, unspecified stage: Secondary | ICD-10-CM | POA: Diagnosis not present

## 2021-07-18 DIAGNOSIS — E440 Moderate protein-calorie malnutrition: Secondary | ICD-10-CM | POA: Diagnosis not present

## 2021-07-18 DIAGNOSIS — J9811 Atelectasis: Secondary | ICD-10-CM | POA: Diagnosis not present

## 2021-07-18 DIAGNOSIS — E041 Nontoxic single thyroid nodule: Secondary | ICD-10-CM | POA: Diagnosis not present

## 2021-07-18 DIAGNOSIS — R609 Edema, unspecified: Secondary | ICD-10-CM | POA: Diagnosis not present

## 2021-07-18 DIAGNOSIS — J439 Emphysema, unspecified: Secondary | ICD-10-CM | POA: Diagnosis not present

## 2021-07-18 DIAGNOSIS — I69328 Other speech and language deficits following cerebral infarction: Secondary | ICD-10-CM | POA: Diagnosis not present

## 2021-07-18 DIAGNOSIS — R0789 Other chest pain: Secondary | ICD-10-CM | POA: Diagnosis not present

## 2021-07-18 DIAGNOSIS — Z79899 Other long term (current) drug therapy: Secondary | ICD-10-CM

## 2021-07-18 DIAGNOSIS — Z87891 Personal history of nicotine dependence: Secondary | ICD-10-CM

## 2021-07-18 DIAGNOSIS — G8929 Other chronic pain: Secondary | ICD-10-CM | POA: Diagnosis present

## 2021-07-18 DIAGNOSIS — Z823 Family history of stroke: Secondary | ICD-10-CM

## 2021-07-18 DIAGNOSIS — Z803 Family history of malignant neoplasm of breast: Secondary | ICD-10-CM

## 2021-07-18 LAB — HEPATIC FUNCTION PANEL
ALT: 18 U/L (ref 0–44)
AST: 68 U/L — ABNORMAL HIGH (ref 15–41)
Albumin: 2.3 g/dL — ABNORMAL LOW (ref 3.5–5.0)
Alkaline Phosphatase: 79 U/L (ref 38–126)
Bilirubin, Direct: 0.2 mg/dL (ref 0.0–0.2)
Indirect Bilirubin: 0.6 mg/dL (ref 0.3–0.9)
Total Bilirubin: 0.8 mg/dL (ref 0.3–1.2)
Total Protein: 8.1 g/dL (ref 6.5–8.1)

## 2021-07-18 LAB — CBC
HCT: 44.2 % (ref 36.0–46.0)
Hemoglobin: 13.5 g/dL (ref 12.0–15.0)
MCH: 30 pg (ref 26.0–34.0)
MCHC: 30.5 g/dL (ref 30.0–36.0)
MCV: 98.2 fL (ref 80.0–100.0)
Platelets: 200 10*3/uL (ref 150–400)
RBC: 4.5 MIL/uL (ref 3.87–5.11)
RDW: 16.6 % — ABNORMAL HIGH (ref 11.5–15.5)
WBC: 4.1 10*3/uL (ref 4.0–10.5)
nRBC: 0 % (ref 0.0–0.2)

## 2021-07-18 LAB — LACTIC ACID, PLASMA: Lactic Acid, Venous: 2.4 mmol/L (ref 0.5–1.9)

## 2021-07-18 LAB — PROTIME-INR
INR: 1.1 (ref 0.8–1.2)
Prothrombin Time: 13.7 seconds (ref 11.4–15.2)

## 2021-07-18 LAB — BASIC METABOLIC PANEL
Anion gap: 11 (ref 5–15)
BUN: 16 mg/dL (ref 8–23)
CO2: 19 mmol/L — ABNORMAL LOW (ref 22–32)
Calcium: 9.2 mg/dL (ref 8.9–10.3)
Chloride: 117 mmol/L — ABNORMAL HIGH (ref 98–111)
Creatinine, Ser: 1.09 mg/dL — ABNORMAL HIGH (ref 0.44–1.00)
GFR, Estimated: 57 mL/min — ABNORMAL LOW (ref >60)
Glucose, Bld: 127 mg/dL — ABNORMAL HIGH (ref 70–99)
Potassium: 4 mmol/L (ref 3.5–5.1)
Sodium: 147 mmol/L — ABNORMAL HIGH (ref 135–145)

## 2021-07-18 LAB — TROPONIN I (HIGH SENSITIVITY): Troponin I (High Sensitivity): 46 ng/L — ABNORMAL HIGH (ref <18)

## 2021-07-18 LAB — CK: Total CK: 70 U/L (ref 38–234)

## 2021-07-18 LAB — AMMONIA: Ammonia: 17 umol/L (ref 9–35)

## 2021-07-18 LAB — SEDIMENTATION RATE: Sed Rate: 10 mm/hr (ref 0–22)

## 2021-07-18 MED ORDER — ACETAMINOPHEN 500 MG PO TABS
1000.0000 mg | ORAL_TABLET | Freq: Once | ORAL | Status: AC
  Administered 2021-07-18: 1000 mg via ORAL
  Filled 2021-07-18: qty 2

## 2021-07-18 MED ORDER — SODIUM CHLORIDE 0.9 % IV SOLN: Freq: Once | INTRAVENOUS | Status: AC

## 2021-07-18 NOTE — ED Notes (Signed)
Iv attempted without success. 

## 2021-07-18 NOTE — ED Notes (Signed)
IV team at bedside 

## 2021-07-18 NOTE — ED Triage Notes (Signed)
Patient complains of loss of appetite, shortness of breath, and generalized weakness that started three days ago. Patient is alert and oriented at this time. ?

## 2021-07-18 NOTE — ED Provider Triage Note (Signed)
Emergency Medicine Provider Triage Evaluation Note ? ?Patient moved to the back before I was able to see and evaluate her. ?  ?Margarita Mail, [name] ?07/19/21 1958 ? ?

## 2021-07-18 NOTE — ED Provider Notes (Signed)
?[facility] ?Provider Note ? ? ?CSN: [id number] ?Arrival date & time: 07/18/21  1451 ? ?  ? ?History ? ?Chief Complaint  ?Patient presents with  ? Shortness of Breath  ? ? ?[name] [name] is a 61 y.o. female. ? ?HPI ?Patient has complex medical history including hospitalization discharge 2\23\2023 for pneumonia, sepsis and empyema.  At that time she did have thoracentesis.  It was nondiagnostic without bacterial growth.  Patient reports she went to rehab for a couple months after her hospitalization.  She reports she felt slightly improved after rehab.  She reports she has been home for about 2 weeks.  Patient reports that she is getting worse since being home.  She is having extreme fatigue and difficulty performing ADLs.  She reports she needs help to get up to go to the bathroom.  She reports she can just barely ambulate.  She reports she has pain in multiple areas of her body.  She reports pain in both legs, her chest and back.  Quality is aching.  She reports she has a red rash on the back of her left shoulder and upper back. ?  ? ?Home Medications ?Prior to Admission medications   ?Medication Sig Start Date End Date Taking? Authorizing Provider  ?acetaminophen (TYLENOL) 325 MG tablet Take 650 mg by mouth every 8 (eight) hours as needed for moderate pain or headache. Give 2 tablets = '1300mg'$  po qAM    [provider]  ?Amino Acids-Protein Hydrolys (FEEDING SUPPLEMENT, PRO-STAT SUGAR FREE 64,) LIQD Take 30 mLs by mouth in the morning and at bedtime.    [provider]  ?amLODipine (NORVASC) 2.5 MG tablet Take 1 tablet (2.5 mg total) by mouth daily. 06/20/21   Medina-Vargas, Monina C, NP  ?aspirin EC 81 MG EC tablet Take 1 tablet (81 mg total) by mouth daily. 06/25/15   Nani Skillern, [name]  ?atorvastatin (LIPITOR) 80 MG tablet TAKE 1 TABLET EVERY DAY  AT  [facility] 06/20/21   Medina-Vargas, Monina C, NP  ?B Complex-C (B-COMPLEX WITH VITAMIN C) tablet Place 1  tablet into feeding tube daily. 05/10/21   Ghimire, Henreitta Leber, MD  ?cholecalciferol (VITAMIN D) 25 MCG (1000 UNIT) tablet Take 1,000 Units by mouth in the morning and at bedtime.    [provider]  ?escitalopram (LEXAPRO) 10 MG tablet Take 1 tablet (10 mg total) by mouth at bedtime. 06/20/21   Medina-Vargas, Senaida Lange, NP  ?Evolocumab with Infusor ([place]) 420 MG/3.5ML SOCT Inject 420 mg into the skin every 30 (thirty) days. 04/26/20   Kroeger, Lorelee Cover., [name]  ?feeding supplement (ENSURE ENLIVE / ENSURE PLUS) LIQD Take 237 mLs by mouth 2 (two) times daily between meals. 05/09/21   Ghimire, Henreitta Leber, MD  ?gabapentin (NEURONTIN) 100 MG capsule Take 1 capsule (100 mg total) by mouth 2 (two) times daily. 06/20/21   Medina-Vargas, Monina C, NP  ?guaiFENesin (MUCINEX) 600 MG 12 hr tablet Take 1 tablet (600 mg total) by mouth 2 (two) times daily as needed (congestion.). 06/20/21   Medina-Vargas, Senaida Lange, NP  ?Insulin Aspart FlexPen (NOVOLOG) 100 UNIT/ML Inject 5 Units into the skin 3 (three) times daily before meals. INJECT 5 UNITS SUBCUTANEOUSLY DAILY BEFORE EACH MEAL FOR CBG >/=300 (PRIME PEN W/2 UNITS PRIOR TO EACH USE) 06/20/21   Medina-Vargas, Monina C, NP  ?metFORMIN (GLUCOPHAGE) 500 MG tablet Take 1 tablet (500 mg total) by mouth 2 (two) times daily. 06/20/21   Medina-Vargas, Monina C,  NP  ?metoprolol tartrate (LOPRESSOR) 25 MG tablet Take 1 tablet (25 mg total) by mouth 2 (two) times daily. 06/20/21   Medina-Vargas, Monina C, NP  ?nitroGLYCERIN (NITROSTAT) 0.4 MG SL tablet DISSOLVE 1 TABLET UNDER THE TONGUE EVERY 5 MINUTES AS  NEEDED FOR CHEST PAIN. MAX  OF 3 TABLETS IN 15 MINUTES. CALL 911 IF PAIN PERSISTS. 06/20/21   Medina-Vargas, Monina C, NP  ?Nutritional Supplements (NUTRITIONAL SUPPLEMENT PO) Take by mouth. One Ensure( 294m) or Boost (2341m or 12048med Pass daily- chocolate if available    [provider]  ?ondansetron (ZOFRAN-ODT) 4 MG disintegrating tablet Take 1 tablet (4 mg  total) by mouth every 8 (eight) hours as needed. 06/20/21   Medina-Vargas, Monina C, NP  ?pantoprazole (PROTONIX) 40 MG tablet Take 1 tablet (40 mg total) by mouth daily. 06/20/21   Medina-Vargas, Monina C, NP  ?thiamine 100 MG tablet Take 2.5 tablets (250 mg total) by mouth daily. 05/09/21   Ghimire, ShaHenreitta LeberD  ?traMADol (ULTRAM) 50 MG tablet Take 1 tablet (50 mg total) by mouth every 8 (eight) hours as needed. 06/20/21   Medina-Vargas, Monina C, NP  ?   ? ?Allergies    ?Apremilast and Latex   ? ?Review of Systems   ?Review of Systems ?10 systems reviewed and negative except as per HPI ?Physical Exam ?Updated Vital Signs ?BP 96/73 (BP Location: Left Arm)   Pulse (!) 130   Temp (!) 97.5 ?F (36.4 ?C) (Oral)   Resp (!) 22   SpO2 98%  ?Physical Exam ?Constitutional:   ?   Comments: Patient is awake and alert.  She does not have respiratory distress.  She is deconditioned in appearance.  ?HENT:  ?   Nose: Nose normal.  ?   Mouth/Throat:  ?   Mouth: Mucous membranes are moist.  ?   Pharynx: Oropharynx is clear.  ?Eyes:  ?   Extraocular Movements: Extraocular movements intact.  ?   Conjunctiva/sclera: Conjunctivae normal.  ?Cardiovascular:  ?   Rate and Rhythm: Regular rhythm. Tachycardia present.  ?Pulmonary:  ?   Comments: Very mild increased work of breathing some crackles more predominant on the right side. ?Abdominal:  ?   General: There is no distension.  ?   Palpations: Abdomen is soft.  ?   Tenderness: There is no abdominal tenderness. There is no guarding.  ?Musculoskeletal:  ?   Cervical back: Neck supple.  ?   Comments: Bilateral feet examined.  No active wounds.  Patient does endorse discomfort to palpation of her lower legs generally, anterior is much as posterior.  Trace edema.  No appearance of active wounds or acute cellulitis.  ?Skin: ?   General: Skin is warm and dry.  ?Neurological:  ?   General: No focal deficit present.  ?   Mental Status: She is oriented to person, place, and time.  ? ? ?ED Results  / Procedures / Treatments   ?Labs ?(all labs ordered are listed, but only abnormal results are displayed) ?Labs Reviewed  ?BASIC METABOLIC PANEL - Abnormal; Notable for the following components:  ?    Result Value  ? Sodium 147 (*)   ? Chloride 117 (*)   ? CO2 19 (*)   ? Glucose, Bld 127 (*)   ? Creatinine, Ser 1.09 (*)   ? GFR, Estimated 57 (*)   ? All other components within normal limits  ?CBC - Abnormal; Notable for the following components:  ? RDW 16.6 (*)   ?  All other components within normal limits  ?URINALYSIS, ROUTINE W REFLEX MICROSCOPIC  ?PROTIME-INR  ?HEPATIC FUNCTION PANEL  ?LACTIC ACID, PLASMA  ?LACTIC ACID, PLASMA  ?CK  ?AMMONIA  ?SEDIMENTATION RATE  ?CBG MONITORING, ED  ?TROPONIN I (HIGH SENSITIVITY)  ? ? ?EKG ?EKG Interpretation ? ?Date/Time:  Thursday Jul 18 2021 15:21:03 EDT ?Ventricular Rate:  130 ?PR Interval:  132 ?QRS Duration: 86 ?QT Interval:  306 ?QTC Calculation: 450 ?R Axis:   61 ?Text Interpretation: Sinus tachycardia with occasional Premature ventricular complexes Inferior infarct , age undetermined Anterior infarct , age undetermined ST & T wave abnormality, consider lateral ischemia Abnormal ECG When compared with ECG of 04-May-2021 07:24, PREVIOUS ECG IS PRESENT Confirmed by Madalyn Rob [phone]) on 07/18/2021 3:50:13 PM ? ?Radiology ?DG Chest 2 View ? ?Result Date: 07/18/2021 ?CLINICAL DATA:  History of recurrent right pleural effusion. EXAM: CHEST - 2 VIEW COMPARISON:  Chest radiograph 07/10/2021. FINDINGS: Stable cardiomegaly status post median sternotomy. Persistent small right pleural effusion with underlying opacities. No pneumothorax. Thoracic spine degenerative changes. IMPRESSION: Persistent small right pleural effusion and underlying atelectasis. Electronically Signed   By: Lovey Newcomer M.D.   On: 07/18/2021 12:52   ? ?Procedures ?Procedures  ? ? ?Medications Ordered in ED ?Medications  ?0.9 %  sodium chloride infusion (has no administration in time range)  ? ? ?ED Course/  Medical Decision Making/ A&P ?  ?                        ?Medical Decision Making ?Amount and/or Complexity of Data Reviewed ?Labs: ordered. ?Radiology: ordered. ? ?Risk ?OTC drugs. ?Prescription drug management.

## 2021-07-19 ENCOUNTER — Other Ambulatory Visit: Payer: Self-pay

## 2021-07-19 DIAGNOSIS — R079 Chest pain, unspecified: Secondary | ICD-10-CM

## 2021-07-19 DIAGNOSIS — I5042 Chronic combined systolic (congestive) and diastolic (congestive) heart failure: Secondary | ICD-10-CM | POA: Diagnosis not present

## 2021-07-19 DIAGNOSIS — N179 Acute kidney failure, unspecified: Secondary | ICD-10-CM | POA: Diagnosis not present

## 2021-07-19 LAB — CBC
HCT: 39.5 % (ref 36.0–46.0)
Hemoglobin: 12.2 g/dL (ref 12.0–15.0)
MCH: 29.7 pg (ref 26.0–34.0)
MCHC: 30.9 g/dL (ref 30.0–36.0)
MCV: 96.1 fL (ref 80.0–100.0)
Platelets: 169 10*3/uL (ref 150–400)
RBC: 4.11 MIL/uL (ref 3.87–5.11)
RDW: 16.6 % — ABNORMAL HIGH (ref 11.5–15.5)
WBC: 3.2 10*3/uL — ABNORMAL LOW (ref 4.0–10.5)
nRBC: 0.6 % — ABNORMAL HIGH (ref 0.0–0.2)

## 2021-07-19 LAB — CBG MONITORING, ED
Glucose-Capillary: 73 mg/dL (ref 70–99)
Glucose-Capillary: 73 mg/dL (ref 70–99)
Glucose-Capillary: 116 mg/dL — ABNORMAL HIGH (ref 70–99)

## 2021-07-19 LAB — BASIC METABOLIC PANEL
Anion gap: 9 (ref 5–15)
BUN: 16 mg/dL (ref 8–23)
CO2: 22 mmol/L (ref 22–32)
Calcium: 9 mg/dL (ref 8.9–10.3)
Chloride: 117 mmol/L — ABNORMAL HIGH (ref 98–111)
Creatinine, Ser: 0.83 mg/dL (ref 0.44–1.00)
GFR, Estimated: >60 mL/min (ref >60)
Glucose, Bld: 90 mg/dL (ref 70–99)
Potassium: 3.5 mmol/L (ref 3.5–5.1)
Sodium: 148 mmol/L — ABNORMAL HIGH (ref 135–145)

## 2021-07-19 LAB — LACTIC ACID, PLASMA: Lactic Acid, Venous: 1.1 mmol/L (ref 0.5–1.9)

## 2021-07-19 LAB — HEPATIC FUNCTION PANEL
ALT: 16 U/L (ref 0–44)
AST: 63 U/L — ABNORMAL HIGH (ref 15–41)
Albumin: 2.1 g/dL — ABNORMAL LOW (ref 3.5–5.0)
Alkaline Phosphatase: 76 U/L (ref 38–126)
Bilirubin, Direct: 0.2 mg/dL (ref 0.0–0.2)
Indirect Bilirubin: 0.5 mg/dL (ref 0.3–0.9)
Total Bilirubin: 0.7 mg/dL (ref 0.3–1.2)
Total Protein: 7.2 g/dL (ref 6.5–8.1)

## 2021-07-19 LAB — TSH: TSH: 1.305 u[IU]/mL (ref 0.350–4.500)

## 2021-07-19 LAB — VITAMIN B12: Vitamin B-12: 1293 pg/mL — ABNORMAL HIGH (ref 180–914)

## 2021-07-19 LAB — TROPONIN I (HIGH SENSITIVITY): Troponin I (High Sensitivity): 44 ng/L — ABNORMAL HIGH (ref <18)

## 2021-07-19 LAB — PHOSPHORUS: Phosphorus: 3.2 mg/dL (ref 2.5–4.6)

## 2021-07-19 LAB — MAGNESIUM: Magnesium: 1.7 mg/dL (ref 1.7–2.4)

## 2021-07-19 LAB — GLUCOSE, CAPILLARY: Glucose-Capillary: 103 mg/dL — ABNORMAL HIGH (ref 70–99)

## 2021-07-19 MED ORDER — ENSURE ENLIVE PO LIQD
237.0000 mL | Freq: Two times a day (BID) | ORAL | Status: DC
  Administered 2021-07-19 – 2021-07-26 (×9): 237 mL via ORAL

## 2021-07-19 MED ORDER — INSULIN ASPART 100 UNIT/ML IJ SOLN
0.0000 [IU] | Freq: Three times a day (TID) | INTRAMUSCULAR | Status: DC
  Administered 2021-07-20: 2 [IU] via SUBCUTANEOUS
  Administered 2021-07-20 – 2021-07-22 (×4): 1 [IU] via SUBCUTANEOUS
  Administered 2021-07-22 – 2021-07-23 (×2): 2 [IU] via SUBCUTANEOUS
  Administered 2021-07-28: 1 [IU] via SUBCUTANEOUS

## 2021-07-19 MED ORDER — GABAPENTIN 100 MG PO CAPS
100.0000 mg | ORAL_CAPSULE | Freq: Two times a day (BID) | ORAL | Status: DC
  Administered 2021-07-19 – 2021-07-29 (×22): 100 mg via ORAL
  Filled 2021-07-19 (×22): qty 1

## 2021-07-19 MED ORDER — METOPROLOL TARTRATE 25 MG PO TABS
25.0000 mg | ORAL_TABLET | Freq: Two times a day (BID) | ORAL | Status: DC
  Administered 2021-07-19 – 2021-07-29 (×21): 25 mg via ORAL
  Filled 2021-07-19 (×21): qty 1

## 2021-07-19 MED ORDER — ONDANSETRON HCL 4 MG PO TABS: 4.0000 mg | ORAL_TABLET | Freq: Four times a day (QID) | ORAL | Status: DC | PRN

## 2021-07-19 MED ORDER — ATORVASTATIN CALCIUM 80 MG PO TABS
80.0000 mg | ORAL_TABLET | Freq: Every day | ORAL | Status: DC
Start: 2021-07-19 — End: 2021-07-29
  Administered 2021-07-19 – 2021-07-29 (×11): 80 mg via ORAL
  Filled 2021-07-19 (×7): qty 1
  Filled 2021-07-19: qty 2
  Filled 2021-07-19 (×3): qty 1

## 2021-07-19 MED ORDER — AMLODIPINE BESYLATE 2.5 MG PO TABS
2.5000 mg | ORAL_TABLET | Freq: Every day | ORAL | Status: DC
  Administered 2021-07-19 – 2021-07-29 (×11): 2.5 mg via ORAL
  Filled 2021-07-19 (×11): qty 1

## 2021-07-19 MED ORDER — ACETAMINOPHEN 325 MG PO TABS
650.0000 mg | ORAL_TABLET | Freq: Four times a day (QID) | ORAL | Status: DC | PRN
  Administered 2021-07-19 – 2021-07-21 (×2): 650 mg via ORAL
  Filled 2021-07-19 (×2): qty 2

## 2021-07-19 MED ORDER — TRAMADOL HCL 50 MG PO TABS
50.0000 mg | ORAL_TABLET | Freq: Three times a day (TID) | ORAL | Status: DC | PRN
  Administered 2021-07-19 – 2021-07-29 (×12): 50 mg via ORAL
  Filled 2021-07-19 (×12): qty 1

## 2021-07-19 MED ORDER — ENOXAPARIN SODIUM 40 MG/0.4ML IJ SOSY
40.0000 mg | PREFILLED_SYRINGE | INTRAMUSCULAR | Status: DC
  Administered 2021-07-19 – 2021-07-29 (×11): 40 mg via SUBCUTANEOUS
  Filled 2021-07-19 (×11): qty 0.4

## 2021-07-19 MED ORDER — ASPIRIN EC 81 MG PO TBEC
81.0000 mg | DELAYED_RELEASE_TABLET | Freq: Every day | ORAL | Status: DC
  Administered 2021-07-19 – 2021-07-29 (×11): 81 mg via ORAL
  Filled 2021-07-19 (×11): qty 1

## 2021-07-19 MED ORDER — ACETAMINOPHEN 650 MG RE SUPP: 650.0000 mg | Freq: Four times a day (QID) | RECTAL | Status: DC | PRN

## 2021-07-19 MED ORDER — INSULIN ASPART 100 UNIT/ML IJ SOLN: 0.0000 [IU] | Freq: Every day | INTRAMUSCULAR | Status: DC

## 2021-07-19 MED ORDER — MUSCLE RUB 10-15 % EX CREA
1.0000 | TOPICAL_CREAM | CUTANEOUS | Status: DC | PRN
Start: 2021-07-19 — End: 2021-07-29
  Administered 2021-07-27: 1 via TOPICAL
  Filled 2021-07-19: qty 85

## 2021-07-19 MED ORDER — PANTOPRAZOLE SODIUM 40 MG PO TBEC
40.0000 mg | DELAYED_RELEASE_TABLET | Freq: Every day | ORAL | Status: DC
  Administered 2021-07-19 – 2021-07-20 (×2): 40 mg via ORAL
  Filled 2021-07-19 (×2): qty 1

## 2021-07-19 MED ORDER — SODIUM CHLORIDE 0.9 % IV SOLN: INTRAVENOUS | Status: AC

## 2021-07-19 MED ORDER — ESCITALOPRAM OXALATE 10 MG PO TABS
10.0000 mg | ORAL_TABLET | Freq: Every day | ORAL | Status: DC
  Administered 2021-07-19 – 2021-07-28 (×11): 10 mg via ORAL
  Filled 2021-07-19 (×11): qty 1

## 2021-07-19 MED ORDER — ONDANSETRON HCL 4 MG/2ML IJ SOLN: 4.0000 mg | Freq: Four times a day (QID) | INTRAMUSCULAR | Status: DC | PRN

## 2021-07-19 MED ORDER — SENNOSIDES-DOCUSATE SODIUM 8.6-50 MG PO TABS: 1.0000 | ORAL_TABLET | Freq: Every evening | ORAL | Status: DC | PRN

## 2021-07-19 MED ORDER — PHENOL 1.4 % MT LIQD
1.0000 | OROMUCOSAL | Status: DC | PRN
Start: 2021-07-19 — End: 2021-07-29
  Administered 2021-07-19: 1 via OROMUCOSAL
  Filled 2021-07-19 (×2): qty 177

## 2021-07-19 MED ORDER — SODIUM CHLORIDE 0.9% FLUSH
3.0000 mL | Freq: Two times a day (BID) | INTRAVENOUS | Status: DC
  Administered 2021-07-19 – 2021-07-29 (×17): 3 mL via INTRAVENOUS

## 2021-07-19 NOTE — H&P (Signed)
?History and Physical  ? ? ?[name] [name] YTK:[id number] DOB: 04-27-1960 DOA: 07/18/2021 ? ?PCP: [name] Cha, MD  ? ?Patient coming from: Home  ? ?Chief Complaint: Weakness, fatigue, aches, loss of appetite  ? ?HPI: [name] [name] is a pleasant 61 y.o. female with medical history significant for depression, anxiety, CAD, type 2 diabetes mellitus, chronic combined systolic and diastolic CHF, thyroid nodules, and history of psoriatic arthritis, presenting to the emergency department with general weakness, fatigue, aches, and loss of appetite.  Patient reports that she returned home from SNF approximately 2 weeks ago, has been working with home health PT, but has become progressively weak in general, fatigued, and has lost her appetite.  She had been ambulating with a walker but is now unable to do so, and is now also requiring assistance to get up from a chair or from her bed.  She has been experiencing aching pain in her back, shoulders, hips, legs, and chest that has insidiously worsened over the past week or 2.  She has been taking tramadol with some relief.  Denies fevers, chills, abdominal pain, or vomiting.  She had a loose stool couple days ago but none since.  She has history of thiamine deficiency but has been taking thiamine tablets. ? ?ED Course: Upon arrival to the ED, patient is found to be afebrile and saturating mid 90s on room air with stable blood pressure.  EKG features sinus tachycardia with PVCs.  CT chest notable for trace right pleural effusion with minimal pleural thickening but no acute airspace disease.  Also noted on CT is a punctate left upper lobe nodule.  Chemistry panel features a sodium 147, bicarbonate 19, creatinine 1.09, and AST 68.  Lactic acid is 2.4 and troponin 46.  Ammonia, ESR, and CK were normal.  Patient was given IV fluids and acetaminophen in the ED. ? ?Review of Systems:  ?All other systems reviewed and apart from HPI, are negative. ? ?Past Medical History:   ?Diagnosis Date  ? Acute ST elevation myocardial infarction (STEMI) involving right coronary artery ([place]) 10/04/2015  ? x 2  ? Anxiety   ? Anxiety and depression   ? CAD (coronary artery disease)   ? a. CABG 06/2015: LIMA-LAD, SVG-RCA, SVG-OM2 b. STEMI s/p PCI to distal RCA lesion with a small Promus Premier stent. ( patent LIMA to the LAD, occluded SVG to OM, occluded SVG to RCA.)  ? CAD (coronary artery disease) of artery bypass graft; occluded SVG to RCA and occluded SVG to OM 10/08/2015  ? Empty sella ([place])   ? GERD (gastroesophageal reflux disease)   ? Headache   ? from stress  ? Hematuria wed 06-13-2020  ? Hidradenitis axillaris   ? History of kidney stones   ? Hyperlipemia   ? Hypertension   ? Lupus ([place]) dx'd 2008  ? "they think I have this; have to get retested" (06/12/2015)  ? Obesity (BMI 30.0-34.9)   ? Pneumonia X 2  ? last time yrs ago  ? PONV (postoperative nausea and vomiting)   ? likes iv med or scopolamine patch works well  ? Psoriasis 06/15/2020  ? hands elbows, ears, small amount on scalp and bridge of nose  ? Rheumatoid aortitis   ? psorriatic arthritis  ? Rheumatoid arthritis ([place])   ? S/P angioplasty with stent; 10/04/15 to distal RCA lesion. with Promus premier. 10/08/2015  ? Scoliosis   ? Sleep apnea   ? patient states "it is mild" no cpap needed  ?  Stroke [facility]) March 2014  ? left MCA branches; "they say I have them all the time", denies residual on 06/12/2015  ? Tobacco abuse   ? Type II diabetes mellitus ([place])   ? Urinary frequency   ? and urgency  ? Wears glasses   ? for reading  ? ? ?Past Surgical History:  ?Procedure Laterality Date  ? ABDOMINAL HYSTERECTOMY  31 yrs ago  ? total has ovaies  ? BREAST BIOPSY Bilateral yrd ago  ? benign  ? CARDIAC CATHETERIZATION N/A 06/13/2015  ? Procedure: Left Heart Cath and Coronary Angiography;  Surgeon: [name] M Martinique, MD;  Location: [facility];  Service: Cardiovascular;  Laterality: N/A;  ? CARDIAC CATHETERIZATION N/A 10/04/2015  ? Procedure: Left  Heart Cath and Coronary Angiography;  Surgeon: [name] Booze, MD;  Location: [facility];  Service: Cardiovascular;  Laterality: N/A;  ? CARDIAC CATHETERIZATION N/A 10/04/2015  ? Procedure: Coronary Stent Intervention;  Surgeon: [name] Booze, MD;  Location: [facility];  Service: Cardiovascular;  Laterality: N/A;  ? CORONARY ARTERY BYPASS GRAFT N/A 06/19/2015  ? Procedure: CORONARY ARTERY BYPASS GRAFTING (CABG) TIMES FOUR USING LEFT INTERNAL MAMMARY, RIGHT SAPHENOUS LEG VEIN AND CRYO SAPHENOUS VEIN;  Surgeon: [name] Poot, MD;  Location: [facility];  Service: Open Heart Surgery;  Laterality: N/A;  ? CYSTOSCOPY WITH RETROGRADE PYELOGRAM, URETEROSCOPY AND STENT PLACEMENT Left 06/21/2020  ? Procedure: CYSTOSCOPY WITH RETROGRADE PYELOGRAM, URETEROSCOPY, BASKET STONE EXTRACTION AND  STENT PLACEMENT;  Surgeon: [name] Fries, MD;  Location: [facility];  Service: Urology;  Laterality: Left;  ? DILATION AND CURETTAGE OF UTERUS  yrs ago  ? HOLMIUM LASER APPLICATION Left 08/24/7946  ? Procedure: HOLMIUM LASER APPLICATION;  Surgeon: [name] Fries, MD;  Location: [facility];  Service: Urology;  Laterality: Left;  ? HYDRADENITIS EXCISION Right 12/20/2019  ? Procedure: EXCISION OF RIGHT  HIDRADENITIS AXILLARY;  Surgeon: [name] Mesa, MD;  Location: [facility];  Service: General;  Laterality: Right;  ? IR URETERAL STENT LEFT NEW ACCESS W/O SEP NEPHROSTOMY CATH  01/10/2019  ? NEPHROLITHOTOMY Left 01/10/2019  ? Procedure: NEPHROLITHOTOMY PERCUTANEOUS;  Surgeon: [name] Rhodes, MD;  Location: [facility];  Service: Urology;  Laterality: Left;  ? TEE WITHOUT CARDIOVERSION N/A 06/19/2015  ? Procedure: TRANSESOPHAGEAL ECHOCARDIOGRAM (TEE);  Surgeon: [name] Poot, MD;  Location: [facility];  Service: Open Heart Surgery;  Laterality: N/A;  ? TONSILLECTOMY  age 16  ? TUBAL LIGATION  many yrs ago  ? ? ?Social History:  ? reports that she quit smoking about 7 years ago. Her smoking use included  cigarettes. She has a 18.00 pack-year smoking history. She has never used smokeless tobacco. She reports that she does not drink alcohol and does not use drugs. ? ?Allergies  ?Allergen Reactions  ? Apremilast Other (See Comments)  ?  unknown  ? Latex Itching, Swelling and Rash  ? ? ?Family History  ?Problem Relation Age of Onset  ? Diabetes Mother   ? Heart failure Mother   ? Breast cancer Mother   ?     34s  ? Stroke Maternal Aunt   ? Depression Son   ? Diabetes Other   ?     maternal  ? Breast cancer Other   ?     2 diff great maternal aunts -  both 43s  ? Alzheimer's disease Other   ?     maternal  ? ? ? ?Prior to Admission medications   ?  Medication Sig Start Date End Date Taking? Authorizing Provider  ?acetaminophen (TYLENOL) 325 MG tablet Take 650 mg by mouth every 8 (eight) hours as needed for moderate pain or headache. Give 2 tablets = $RemoveB'1300mg'[id number]$  po qAM   Yes [provider]  ?amLODipine (NORVASC) 2.5 MG tablet Take 1 tablet (2.5 mg total) by mouth daily. 06/20/21  Yes Medina-Vargas, Monina C, NP  ?atorvastatin (LIPITOR) 80 MG tablet TAKE 1 TABLET EVERY DAY  AT  6PM 06/20/21  Yes Medina-Vargas, Monina C, NP  ?escitalopram (LEXAPRO) 10 MG tablet Take 1 tablet (10 mg total) by mouth at bedtime. 06/20/21  Yes Medina-Vargas, Monina C, NP  ?gabapentin (NEURONTIN) 100 MG capsule Take 1 capsule (100 mg total) by mouth 2 (two) times daily. 06/20/21  Yes Medina-Vargas, Monina C, NP  ?metFORMIN (GLUCOPHAGE) 500 MG tablet Take 1 tablet (500 mg total) by mouth 2 (two) times daily. 06/20/21  Yes Medina-Vargas, Monina C, NP  ?metoprolol tartrate (LOPRESSOR) 25 MG tablet Take 1 tablet (25 mg total) by mouth 2 (two) times daily. 06/20/21  Yes Medina-Vargas, Monina C, NP  ?pantoprazole (PROTONIX) 40 MG tablet Take 1 tablet (40 mg total) by mouth daily. 06/20/21  Yes Medina-Vargas, Monina C, NP  ?traMADol (ULTRAM) 50 MG tablet Take 1 tablet (50 mg total) by mouth every 8 (eight) hours as needed. 06/20/21  Yes Medina-Vargas, Monina C, NP   ?Amino Acids-Protein Hydrolys (FEEDING SUPPLEMENT, PRO-STAT SUGAR FREE 64,) LIQD Take 30 mLs by mouth in the morning and at bedtime.    [provider]  ?aspirin EC 81 MG EC tablet Take 1 tablet (81 mg

## 2021-07-19 NOTE — Hospital Course (Signed)
61 y.o. female with medical history significant for depression, anxiety, CAD, type 2 diabetes mellitus, chronic combined systolic and diastolic CHF, thyroid nodules, and history of psoriatic arthritis, presenting to the emergency department with general weakness, fatigue, aches, and loss of appetite.  Patient reports that she returned home from SNF approximately 2 weeks ago, has been working with home health PT, but has become progressively weak in general, fatigued, and has lost her appetite.  She had been ambulating with a walker but is now unable to do so, and is now also requiring assistance to get up from a chair or from her bed.  She has been experiencing aching pain in her back, shoulders, hips, legs, and chest that has insidiously worsened over the past week or 2.  She has been taking tramadol with some relief.  Denies fevers, chills, abdominal pain, or vomiting.  She had a loose stool couple days ago but none since.  She has history of thiamine deficiency but has been taking thiamine tablets ? ?Upon arrival to the ED, patient is found to be afebrile and saturating mid 90s on room air with stable blood pressure.  EKG features sinus tachycardia with PVCs.  CT chest notable for trace right pleural effusion with minimal pleural thickening but no acute airspace disease.  Also noted on CT is a punctate left upper lobe nodule.  Chemistry panel features a sodium 147, bicarbonate 19, creatinine 1.09, and AST 68.  Lactic acid is 2.4 and troponin 46.  Ammonia, ESR, and CK were normal.  Patient was given IV fluids and acetaminophen in the ED. ?

## 2021-07-19 NOTE — Progress Notes (Signed)
Initial Nutrition Assessment ? ?DOCUMENTATION CODES:  ? ?Not applicable ? ?INTERVENTION:  ?Provide Ensure Enlive po BID, each supplement provides 350 kcal and 20 grams of protein. ? ?Encourage adequate PO intake.  ? ?NUTRITION DIAGNOSIS:  ? ?Increased nutrient needs related to chronic illness (CHF) as evidenced by estimated needs. ? ?GOAL:  ? ?Patient will meet greater than or equal to 90% of their needs ? ?MONITOR:  ? ?PO intake, Supplement acceptance, Labs, Weight trends, Skin, I & O's ? ?REASON FOR ASSESSMENT:  ? ?Malnutrition Screening Tool ?  ? ?ASSESSMENT:  ? ?61 y.o. female with medical history significant for depression, anxiety, CAD, type 2 diabetes mellitus, chronic combined systolic and diastolic CHF, thyroid nodules, and history of psoriatic arthritis, presents with general weakness, fatigue, aches, and loss of appetite. Pt with AKI, hypernatremia. ? ?Pt reports eating well currently and PTA with usual consumption of at least 3-4 meals a day with multiple snacks in between. Pt reports following a high protein, low sodium diet. Pt with a 14% weight loss in 3 months, significant for time frame, however likely related to fluid status. RD to order nutritional supplements to aid in caloric and protein needs.  ? ?NUTRITION - FOCUSED PHYSICAL EXAM: ? ?Flowsheet Row Most Recent Value  ?Orbital Region No depletion  ?Upper Arm Region Moderate depletion  ?Thoracic and Lumbar Region No depletion  ?Buccal Region No depletion  ?Temple Region No depletion  ?Clavicle Bone Region Moderate depletion  ?Clavicle and Acromion Bone Region Moderate depletion  ?Scapular Bone Region Unable to assess  ?Dorsal Hand Unable to assess  ?Patellar Region No depletion  ?Anterior Thigh Region Moderate depletion  ?Posterior Calf Region No depletion  ?Edema (RD Assessment) None  ?Hair Reviewed  ?Eyes Reviewed  ?Mouth Reviewed  ?Skin Reviewed  ?Nails Reviewed  ? ?  ? ?Labs and medications reviewed.  ? ?Diet Order:   ?Diet Order   ? ?        ?  Diet Heart Room service appropriate? Yes; Fluid consistency: Thin  Diet effective now       ?  ? ?  ?  ? ?  ? ? ?EDUCATION NEEDS:  ? ?Not appropriate for education at this time ? ?Skin:  Skin Assessment: Reviewed RN Assessment ? ?Last BM:  5/5 ? ?Height:  ? ?Ht Readings from Last 1 Encounters:  ?07/19/21 '5\' 6"'$  (1.676 m)  ? ? ?Weight:  ? ?Wt Readings from Last 1 Encounters:  ?07/19/21 64.7 kg  ? ?BMI:  Body mass index is 23.02 kg/m?. ? ?Estimated Nutritional Needs:  ? ?Kcal:  1800-2000 ? ?Protein:  85-100 grams ? ?Fluid:  >/= 1.8 L/day ? ?Corrin Parker, MS, RD, LDN ?RD pager number/after hours weekend pager number on Amion. ? ?

## 2021-07-19 NOTE — Progress Notes (Signed)
?Progress Note ? ? ?Patient: [name] [name] UVO:[id number] DOB: Nov 12, 1960 DOA: 07/18/2021     1 ?DOS: the patient was seen and examined on 07/19/2021 ?  ?Brief hospital course: ?61 y.o. female with medical history significant for depression, anxiety, CAD, type 2 diabetes mellitus, chronic combined systolic and diastolic CHF, thyroid nodules, and history of psoriatic arthritis, presenting to the emergency department with general weakness, fatigue, aches, and loss of appetite.  Patient reports that she returned home from SNF approximately 2 weeks ago, has been working with home health PT, but has become progressively weak in general, fatigued, and has lost her appetite.  She had been ambulating with a walker but is now unable to do so, and is now also requiring assistance to get up from a chair or from her bed.  She has been experiencing aching pain in her back, shoulders, hips, legs, and chest that has insidiously worsened over the past week or 2.  She has been taking tramadol with some relief.  Denies fevers, chills, abdominal pain, or vomiting.  She had a loose stool couple days ago but none since.  She has history of thiamine deficiency but has been taking thiamine tablets ? ?Upon arrival to the ED, patient is found to be afebrile and saturating mid 90s on room air with stable blood pressure.  EKG features sinus tachycardia with PVCs.  CT chest notable for trace right pleural effusion with minimal pleural thickening but no acute airspace disease.  Also noted on CT is a punctate left upper lobe nodule.  Chemistry panel features a sodium 147, bicarbonate 19, creatinine 1.09, and AST 68.  Lactic acid is 2.4 and troponin 46.  Ammonia, ESR, and CK were normal.  Patient was given IV fluids and acetaminophen in the ED. ? ?Assessment and Plan: ?No notes have been filed under this hospital service. ?Service: Hospitalist ? ?1. AKI; hypernatremia  ?- Presents with fatigue, loss of appetite, and general weakness and found to  have mild hypernatremia and SCr of 1.09, up from a baseline of 0.5  ?- Serum CK normal  ?- Likely prerenal in setting of loss of appetite and dehydration ?- Improved with IVF ?-Recheck bmet in AM ?  ?2. General weakness, debility  ?- Presents with slowly progressive general weakness since returning home from SNF ~2 wks ago, no longer able to ambulate with her walker and now requiring assistance to get up from chair or bed   ?- Serum CK, ammonia, and sed rate normal in ED  ?- On discussion with patient, pt reports family has good intentions to care for pt after return from SNF, however has been logistically unable to take care for pt, resulting in pt essentially being bed bound and not have access to food/drink ?-PT consulted ?  ?3. CAD; elevated troponin  ?- Troponin mildly elevated in without anginal complaints  ?- Trop trending down; continue ASA, Lipitor, and metoprolol   ?  ?4. Chronic combined systolic & diastolic CHF  ?- EF was 74% with grade 1 diastolic dysfunction on TTE in February 2023   ?- She is hypovolemic on admission  ?- Improving with IVF ?  ?5. Hx of CVA  ?- Continue ASA and statin  ?  ?6. Lactic acidosis  ?- Lactic acid elevated to 2.4 in ED  ?- Suspect elevated secondary to dehydration ?-LA normalized with IVF ?  ? ?  ? ?Subjective: Feeling thirsty and hungry this AM. Feeling weaker than normal ? ?Physical Exam: ?Vitals:  ?  07/19/21 1342 07/19/21 1430 07/19/21 1435 07/19/21 1650  ?BP: (!) 139/91  131/74 104/63  ?Pulse: 68  68 72  ?Resp: $Remov'16  20 20  'lbAbQT$ ?Temp:   98 ?F (36.7 ?C) 97.6 ?F (36.4 ?C)  ?TempSrc:   Oral Oral  ?SpO2: 98%  100% 97%  ?Weight:  64.7 kg    ?Height:  $RemoveB'5\' 6"'FqKlYTsw$  (1.676 m)    ? ?General exam: Awake, laying in bed, in nad ?Respiratory system: Normal respiratory effort, no wheezing ?Cardiovascular system: regular rate, s1, s2 ?Gastrointestinal system: Soft, nondistended, positive BS ?Central nervous system: CN2-12 grossly intact, strength intact ?Extremities: Perfused, no clubbing ?Skin:  Normal skin turgor, no notable skin lesions seen ?Psychiatry: Mood normal // no visual hallucinations  ? ?Data Reviewed: ? ?Labs reviewed: Cr 0.83, Na 148  ? ?Family Communication: Pt in room, family not at bedside ? ?Disposition: ?Status is: Inpatient ?Remains inpatient appropriate because: Severity of illness ? Planned Discharge Destination:  Pending PT eval ? ? ? ?Author: ?Marylu Lund, MD ?07/19/2021 6:52 PM ? ?For on call review [email or web address].  ?

## 2021-07-19 NOTE — Progress Notes (Signed)
X-ray with small pleural effusion.  Unchanged from previous.  Clinical monitoring is appropriate

## 2021-07-19 NOTE — TOC CM/SW Note (Signed)
Received notification that pt is active with [facility] Unit CM will continue to follow for dc needs. Will need HH resumption of care orders at dc. Jonnie Finner RN3 CCM, Heart Failure TOC CM [phone]  ?

## 2021-07-20 DIAGNOSIS — R262 Difficulty in walking, not elsewhere classified: Secondary | ICD-10-CM

## 2021-07-20 DIAGNOSIS — E870 Hyperosmolality and hypernatremia: Secondary | ICD-10-CM | POA: Diagnosis not present

## 2021-07-20 DIAGNOSIS — I5042 Chronic combined systolic (congestive) and diastolic (congestive) heart failure: Secondary | ICD-10-CM | POA: Diagnosis not present

## 2021-07-20 LAB — BASIC METABOLIC PANEL
Anion gap: 4 — ABNORMAL LOW (ref 5–15)
BUN: 14 mg/dL (ref 8–23)
CO2: 22 mmol/L (ref 22–32)
Calcium: 8.6 mg/dL — ABNORMAL LOW (ref 8.9–10.3)
Chloride: 120 mmol/L — ABNORMAL HIGH (ref 98–111)
Creatinine, Ser: 0.72 mg/dL (ref 0.44–1.00)
GFR, Estimated: >60 mL/min (ref >60)
Glucose, Bld: 101 mg/dL — ABNORMAL HIGH (ref 70–99)
Potassium: 3.8 mmol/L (ref 3.5–5.1)
Sodium: 146 mmol/L — ABNORMAL HIGH (ref 135–145)

## 2021-07-20 LAB — GLUCOSE, CAPILLARY
Glucose-Capillary: 95 mg/dL (ref 70–99)
Glucose-Capillary: 168 mg/dL — ABNORMAL HIGH (ref 70–99)
Glucose-Capillary: 123 mg/dL — ABNORMAL HIGH (ref 70–99)
Glucose-Capillary: 80 mg/dL (ref 70–99)

## 2021-07-20 LAB — CBC
HCT: 38 % (ref 36.0–46.0)
Hemoglobin: 12.1 g/dL (ref 12.0–15.0)
MCH: 30.4 pg (ref 26.0–34.0)
MCHC: 31.8 g/dL (ref 30.0–36.0)
MCV: 95.5 fL (ref 80.0–100.0)
Platelets: 155 10*3/uL (ref 150–400)
RBC: 3.98 MIL/uL (ref 3.87–5.11)
RDW: 16.5 % — ABNORMAL HIGH (ref 11.5–15.5)
WBC: 3.3 10*3/uL — ABNORMAL LOW (ref 4.0–10.5)
nRBC: 0.6 % — ABNORMAL HIGH (ref 0.0–0.2)

## 2021-07-20 MED ORDER — PANTOPRAZOLE SODIUM 40 MG PO TBEC
40.0000 mg | DELAYED_RELEASE_TABLET | Freq: Two times a day (BID) | ORAL | Status: DC
  Administered 2021-07-20 – 2021-07-29 (×18): 40 mg via ORAL
  Filled 2021-07-20 (×18): qty 1

## 2021-07-20 MED ORDER — GUAIFENESIN ER 600 MG PO TB12
600.0000 mg | ORAL_TABLET | Freq: Two times a day (BID) | ORAL | Status: DC
  Administered 2021-07-20 – 2021-07-29 (×19): 600 mg via ORAL
  Filled 2021-07-20 (×19): qty 1

## 2021-07-20 MED ORDER — CLOTRIMAZOLE 2 % VA CREA
1.0000 | TOPICAL_CREAM | Freq: Every day | VAGINAL | Status: AC
Start: 2021-07-20 — End: 2021-07-22
  Administered 2021-07-20 – 2021-07-22 (×3): 1 via VAGINAL
  Filled 2021-07-20: qty 21

## 2021-07-20 MED ORDER — MAGIC MOUTHWASH
10.0000 mL | Freq: Three times a day (TID) | ORAL | Status: DC
Start: 2021-07-20 — End: 2021-07-29
  Administered 2021-07-20 – 2021-07-29 (×26): 10 mL via ORAL
  Filled 2021-07-20 (×29): qty 10

## 2021-07-20 NOTE — Progress Notes (Signed)
? ?[facility] ?PROGRESS NOTE ? ? ?[name] [name] [id number]:[id number] DOB: [birth year] DOA: 07/18/2021 ? ?PCP: Leeroy Cha, MD ? ?Brief History/Interval Summary: 61 y.o. female with medical history significant for depression, anxiety, CAD, type 2 diabetes mellitus, chronic combined systolic and diastolic CHF, thyroid nodules, and history of psoriatic arthritis, presenting to the emergency department with general weakness, fatigue, aches, and loss of appetite.  Patient reports that she returned home from SNF approximately 2 weeks ago, has been working with home health PT, but has become progressively weak in general, fatigued, and has lost her appetite.  She had been ambulating with a walker but is now unable to do so, and is now also requiring assistance to get up from a chair or from her bed.  She has been experiencing aching pain in her back, shoulders, hips, legs, and chest that has insidiously worsened over the past week or 2.  She has been taking tramadol with some relief.  Denies fevers, chills, abdominal pain, or vomiting.  She had a loose stool couple days ago but none since.  She has history of thiamine deficiency but has been taking thiamine tablets ?  ?Upon arrival to the ED, patient is found to be afebrile and saturating mid 90s on room air with stable blood pressure.  EKG features sinus tachycardia with PVCs.  CT chest notable for trace right pleural effusion with minimal pleural thickening but no acute airspace disease.  Also noted on CT is a punctate left upper lobe nodule.  Chemistry panel features a sodium 147, bicarbonate 19, creatinine 1.09, and AST 68.  Lactic acid is 2.4 and troponin 46.  Ammonia, ESR, and CK were normal.  Patient was given IV fluids and acetaminophen in the ED. ? ? ?Consultants: None ? ?Procedures: None ? ? ? ?Subjective/Interval History: ?Patient complains of sore throat, cough, subsequently mentions vaginal itching to the nursing staff.  Denies any chest  pain. ? ? ? ? ?Assessment/Plan: ? ?AKI; hypernatremia  ?- Presents with fatigue, loss of appetite, and general weakness and found to have mild hypernatremia and SCr of 1.09, up from a baseline of 0.5  ?- Serum CK normal  ?- Likely prerenal in setting of loss of appetite and dehydration ?Patient was hydrated with improvement in renal function.  Sodium level is stable. ?Has been urinating.  No significant pedal edema noted.  ? ?Cough ?A CT chest was done at the time of admission which does not show any airspace disease.  Minimal bronchiectasis was noted.  We will prescribe Mucinex.  Flutter valve.  Hold off on antibiotics since her WBC is normal and she is afebrile. ? ?General weakness, debility  ?- Presents with slowly progressive general weakness since returning home from SNF ~2 wks ago, no longer able to ambulate with her walker and now requiring assistance to get up from chair or bed   ?- Serum CK, ammonia, and sed rate normal in ED  ?- On discussion with patient, pt reports family has good intentions to care for pt after return from SNF, however has been logistically unable to take care for pt, resulting in pt essentially being bed bound and not have access to food/drink ?-PT consulted ?  ?CAD; elevated troponin  ?- Troponin mildly elevated in without anginal complaints  ?- Trop trending down; continue ASA, Lipitor, and metoprolol   ?  ?Chronic combined systolic & diastolic CHF  ?- EF was 22% with grade 1 diastolic dysfunction on TTE in February 2023   ?- She  was hypovolemic on admission.  Was hydrated.  IV fluids discontinued.  No significant volume overload noted.  Continue to monitor for now. ?  ?Hx of CVA  ?- Continue ASA and statin  ?  ?Lactic acidosis  ?- Lactic acid elevated to 2.4 in ED  ?- Suspect elevated secondary to dehydration ?-LA normalized with IVF ?  ?  ?DVT Prophylaxis: Lovenox ?Code Status: Full code ?Family Communication: Discussed with patient ?Disposition Plan: To be determined ? ?Status is:  Inpatient ?Remains inpatient appropriate because: Physical deconditioning, acute kidney injury, unsafe discharge ? ? ? ? ? ?Medications: Scheduled: ? amLODipine  2.5 mg Oral Daily  ? aspirin EC  81 mg Oral Daily  ? atorvastatin  80 mg Oral Daily  ? enoxaparin (LOVENOX) injection  40 mg Subcutaneous Q24H  ? escitalopram  10 mg Oral QHS  ? feeding supplement  237 mL Oral BID BM  ? gabapentin  100 mg Oral BID  ? insulin aspart  0-5 Units Subcutaneous QHS  ? insulin aspart  0-9 Units Subcutaneous TID WC  ? metoprolol tartrate  25 mg Oral BID  ? pantoprazole  40 mg Oral Daily  ? sodium chloride flush  3 mL Intravenous Q12H  ? ?Continuous: ?VHQ:IONGEXBMWUXLK **OR** acetaminophen, Muscle Rub, ondansetron **OR** ondansetron (ZOFRAN) IV, phenol, senna-docusate, traMADol ? ?Antibiotics: ?Anti-infectives (From admission, onward)  ? ? None  ? ?  ? ? ?Objective: ? ?Vital Signs ? ?Vitals:  ? 07/20/21 0013 07/20/21 0257 07/20/21 0400 07/20/21 0823  ?BP:   102/61 (!) 147/83  ?Pulse:    77  ?Resp:   17 18  ?Temp: 98 ?F (36.7 ?C)  98.7 ?F (37.1 ?C) 98.6 ?F (37 ?C)  ?TempSrc:   Oral Oral  ?SpO2:   93% 97%  ?Weight:  66.4 kg    ?Height:      ? ? ?Intake/Output Summary (Last 24 hours) at 07/20/2021 1121 ?Last data filed at 07/20/2021 0300 ?Gross per 24 hour  ?Intake 360 ml  ?Output 0 ml  ?Net 360 ml  ? ?Filed Weights  ? 07/19/21 1430 07/20/21 0257  ?Weight: 64.7 kg 66.4 kg  ? ? ?General appearance: Awake alert.  In no distress ?Resp: Diminished air entry at the bases but normal effort.  No wheezing or rhonchi. ?Cardio: S1-S2 is normal regular.  No S3-S4.  No rubs murmurs or bruit ?GI: Abdomen is soft.  Nontender nondistended.  Bowel sounds are present normal.  No masses organomegaly ?Extremities: No edema.  Physical deconditioning noted. ?Neurologic: Alert and oriented x3.  No focal neurological deficits.  ? ? ?Lab Results: ? ?Data Reviewed: I have personally reviewed following labs and reports of the imaging studies ? ?CBC: ?Recent Labs   ?Lab 07/18/21 ?1541 07/19/21 ?0500 07/20/21 ?0149  ?WBC 4.1 3.2* 3.3*  ?HGB 13.5 12.2 12.1  ?HCT 44.2 39.5 38.0  ?MCV 98.2 96.1 95.5  ?PLT 200 169 155  ? ? ?Basic Metabolic Panel: ?Recent Labs  ?Lab 07/18/21 ?1541 07/19/21 ?0500 07/20/21 ?0149  ?NA 147* 148* 146*  ?K 4.0 3.5 3.8  ?CL 117* 117* 120*  ?CO2 19* 22 22  ?GLUCOSE 127* 90 101*  ?BUN _0 ?CREATININE 1.09* 0.83 0.72  ?CALCIUM 9.2 9.0 8.6*  ?MG  --  1.7  --   ?PHOS  --  3.2  --   ? ? ?GFR: ?Estimated Creatinine Clearance: 68.3 mL/min (by C-G formula based on SCr of 0.72 mg/dL). ? ?Liver Function Tests: ?Recent Labs  ?Lab 07/18/21 ?1904 07/19/21 ?  0500  ?AST 68* 63*  ?ALT 18 16  ?ALKPHOS 79 76  ?BILITOT 0.8 0.7  ?PROT 8.1 7.2  ?ALBUMIN 2.3* 2.1*  ? ? ? ?Recent Labs  ?Lab 07/18/21 ?1830  ?AMMONIA 17  ? ? ?Coagulation Profile: ?Recent Labs  ?Lab 07/18/21 ?1904  ?INR 1.1  ? ? ?Cardiac Enzymes: ?Recent Labs  ?Lab 07/18/21 ?1904  ?CKTOTAL 70  ? ? ? ? ?CBG: ?Recent Labs  ?Lab 07/19/21 ?0111 07/19/21 ?0750 07/19/21 ?1206 07/19/21 ?2136 07/20/21 ?0903  ?GLUCAP 73 73 116* 103* 95  ? ? ? ?Thyroid Function Tests: ?Recent Labs  ?  07/19/21 ?0500  ?TSH 1.305  ? ? ?Anemia Panel: ?Recent Labs  ?  07/19/21 ?0500  ?OBOFPULG49 1,293*  ? ? ? ?Radiology Studies: ?CT Chest Wo Contrast ? ?Result Date: 07/18/2021 ?CLINICAL DATA:  Loss of appetite shortness of breath EXAM: CT CHEST WITHOUT CONTRAST TECHNIQUE: Multidetector CT imaging of the chest was performed following the standard protocol without IV contrast. RADIATION DOSE REDUCTION: This exam was performed according to the departmental dose-optimization program which includes automated exposure control, adjustment of the mA and/or kV according to patient size and/or use of iterative reconstruction technique. COMPARISON:  Chest x-ray 07/17/2021 FINDINGS: Cardiovascular: Limited evaluation without intravenous contrast. Mild aortic atherosclerosis. No aneurysm. Post CABG changes. Coronary vascular calcification. Mild  cardiomegaly. No pericardial effusion Mediastinum/Nodes: Midline trachea. No thyroid mass. No suspicious lymph nodes. Esophagus within normal limits. Lungs/Pleura: Trace right-sided pleural effusion and minimal pleural thi

## 2021-07-20 NOTE — Progress Notes (Signed)
Notified on call provider pt with zero urine output overnight. Last void unknown, likely around 2-3pm per pt. Bladder scan showed 284cc and pt has no urge to void. Pt is however edematous and has gained 1.7kg in the last 24hrs. Per MD, re-check bladder scan if pt hasn't voided in 2-3hrs. Will continue to follow current plan of care.  ?

## 2021-07-20 NOTE — Progress Notes (Signed)
?   07/20/21 1145  ?Clinical Encounter Type  ?Visited With Patient  ?Visit Type Initial;Spiritual support  ?Referral From Nurse  ?Consult/Referral To Chaplain  ? ?Chaplain responded to a spiritual consult request for prayer. The patient, [name] [name], shared a life full of joy from her grandchildren her sons and her husband. Family was clearly her priority and people in general. She cared for her family and they in turn care for her. Her prayer is for going home and being able to see her grandchildren again. Her memories are full of good times with them. We prayed as [name] requested and I left as a nurse came in to care for her and her lunch was coming in as well.  ? ?Danice Goltz ?Chaplain  ?[facility]  ?[phone] ? ?

## 2021-07-20 NOTE — Progress Notes (Signed)
PT Cancellation Note ? ?Patient Details ?Name: [name] [name] ?MRN: [id number] ?DOB: 08/06/1960 ? ? ?Cancelled Treatment:    Reason Eval/Treat Not Completed: Other (comment);Pain limiting ability to participate.  Pt is too painful and weak feeling to try to move, has been up already today but not able to do anything right now.  Follow up at another time to get baseline for her mobility. ? ? ?Ramond Dial ?07/20/2021, 1:28 PM ? ?Mee Hives, PT PhD ?Acute Rehab Dept. Number: [facility] [phone] and [place] [phone] ? ?

## 2021-07-21 ENCOUNTER — Inpatient Hospital Stay (HOSPITAL_COMMUNITY): Payer: Medicare HMO

## 2021-07-21 DIAGNOSIS — J029 Acute pharyngitis, unspecified: Secondary | ICD-10-CM

## 2021-07-21 DIAGNOSIS — I5042 Chronic combined systolic (congestive) and diastolic (congestive) heart failure: Secondary | ICD-10-CM | POA: Diagnosis not present

## 2021-07-21 DIAGNOSIS — R509 Fever, unspecified: Secondary | ICD-10-CM

## 2021-07-21 DIAGNOSIS — R051 Acute cough: Secondary | ICD-10-CM | POA: Diagnosis not present

## 2021-07-21 DIAGNOSIS — R609 Edema, unspecified: Secondary | ICD-10-CM | POA: Diagnosis not present

## 2021-07-21 LAB — BASIC METABOLIC PANEL
Anion gap: 9 (ref 5–15)
BUN: 12 mg/dL (ref 8–23)
CO2: 19 mmol/L — ABNORMAL LOW (ref 22–32)
Calcium: 9.1 mg/dL (ref 8.9–10.3)
Chloride: 121 mmol/L — ABNORMAL HIGH (ref 98–111)
Creatinine, Ser: 0.73 mg/dL (ref 0.44–1.00)
GFR, Estimated: >60 mL/min (ref >60)
Glucose, Bld: 93 mg/dL (ref 70–99)
Potassium: 3.7 mmol/L (ref 3.5–5.1)
Sodium: 149 mmol/L — ABNORMAL HIGH (ref 135–145)

## 2021-07-21 LAB — RESPIRATORY PANEL BY PCR

## 2021-07-21 LAB — LACTIC ACID, PLASMA: Lactic Acid, Venous: 2 mmol/L (ref 0.5–1.9)

## 2021-07-21 LAB — CBC
HCT: 42.9 % (ref 36.0–46.0)
Hemoglobin: 13.6 g/dL (ref 12.0–15.0)
MCH: 30.5 pg (ref 26.0–34.0)
MCHC: 31.7 g/dL (ref 30.0–36.0)
MCV: 96.2 fL (ref 80.0–100.0)
Platelets: 135 10*3/uL — ABNORMAL LOW (ref 150–400)
RBC: 4.46 MIL/uL (ref 3.87–5.11)
RDW: 16.7 % — ABNORMAL HIGH (ref 11.5–15.5)
WBC: 6.3 10*3/uL (ref 4.0–10.5)
nRBC: 0 % (ref 0.0–0.2)

## 2021-07-21 LAB — URINALYSIS, ROUTINE W REFLEX MICROSCOPIC
Bilirubin Urine: NEGATIVE
Glucose, UA: NEGATIVE mg/dL
Hgb urine dipstick: NEGATIVE
Ketones, ur: NEGATIVE mg/dL
Leukocytes,Ua: NEGATIVE
Nitrite: NEGATIVE
Protein, ur: 30 mg/dL — AB
Specific Gravity, Urine: 1.019 (ref 1.005–1.030)
pH: 5 (ref 5.0–8.0)

## 2021-07-21 LAB — GLUCOSE, CAPILLARY
Glucose-Capillary: 117 mg/dL — ABNORMAL HIGH (ref 70–99)
Glucose-Capillary: 129 mg/dL — ABNORMAL HIGH (ref 70–99)
Glucose-Capillary: 126 mg/dL — ABNORMAL HIGH (ref 70–99)
Glucose-Capillary: 100 mg/dL — ABNORMAL HIGH (ref 70–99)

## 2021-07-21 LAB — RESP PANEL BY RT-PCR (FLU A&B, COVID) ARPGX2
Influenza A by PCR: NEGATIVE
Influenza B by PCR: NEGATIVE
SARS Coronavirus 2 by RT PCR: NEGATIVE

## 2021-07-21 LAB — PROCALCITONIN: Procalcitonin: 0.14 ng/mL

## 2021-07-21 LAB — GROUP A STREP BY PCR: Group A Strep by PCR: NOT DETECTED

## 2021-07-21 LAB — C-REACTIVE PROTEIN: CRP: 6.7 mg/dL — ABNORMAL HIGH (ref <1.0)

## 2021-07-21 MED ORDER — LEVOFLOXACIN IN D5W 500 MG/100ML IV SOLN
500.0000 mg | INTRAVENOUS | Status: DC
Start: 2021-07-21 — End: 2021-07-22
  Administered 2021-07-21 – 2021-07-22 (×2): 500 mg via INTRAVENOUS
  Filled 2021-07-21 (×2): qty 100

## 2021-07-21 MED ORDER — SACCHAROMYCES BOULARDII 250 MG PO CAPS
250.0000 mg | ORAL_CAPSULE | Freq: Two times a day (BID) | ORAL | Status: DC
Start: 2021-07-21 — End: 2021-07-29
  Administered 2021-07-21 – 2021-07-29 (×17): 250 mg via ORAL
  Filled 2021-07-21 (×17): qty 1

## 2021-07-21 NOTE — Progress Notes (Signed)
Lower extremity venous attempted. Patient with IV team, followed by PT. Will attempt again as schedule and patient availability permits.  ? ?Darlin Coco, RDMS, RVT ? ?

## 2021-07-21 NOTE — Progress Notes (Addendum)
? ?[facility] ?PROGRESS NOTE ? ? ?[name] [name] LFY:[id number] DOB: [birth year] DOA: 07/18/2021 ? ?PCP: Leeroy Cha, MD ? ?Brief History/Interval Summary: 61 y.o. female with medical history significant for depression, anxiety, CAD, type 2 diabetes mellitus, chronic combined systolic and diastolic CHF, thyroid nodules, and history of psoriatic arthritis, presenting to the emergency department with general weakness, fatigue, aches, and loss of appetite.  Patient reports that she returned home from SNF approximately 2 weeks ago, has been working with home health PT, but has become progressively weak in general, fatigued, and has lost her appetite.  She had been ambulating with a walker but is now unable to do so, and is now also requiring assistance to get up from a chair or from her bed.  She has been experiencing aching pain in her back, shoulders, hips, legs, and chest that has insidiously worsened over the past week or 2.  She has been taking tramadol with some relief.  Denies fevers, chills, abdominal pain, or vomiting.  She had a loose stool couple days ago but none since.  She has history of thiamine deficiency but has been taking thiamine tablets ?  ?Upon arrival to the ED, patient is found to be afebrile and saturating mid 90s on room air with stable blood pressure.  EKG features sinus tachycardia with PVCs.  CT chest notable for trace right pleural effusion with minimal pleural thickening but no acute airspace disease.  Also noted on CT is a punctate left upper lobe nodule.  Chemistry panel features a sodium 147, bicarbonate 19, creatinine 1.09, and AST 68.  Lactic acid is 2.4 and troponin 46.  Ammonia, ESR, and CK were normal.  Patient was given IV fluids and acetaminophen in the ED. ? ? ?Consultants: None ? ?Procedures: None ? ? ? ?Subjective/Interval History: ?Noted to have episodes of fever overnight.  Continues to complain of sore throat.  Difficulty swallowing is present.  Also  continues to have a cough with yellowish expectoration.  Vaginal itching has almost resolved with medications.   ? ? ? ?Assessment/Plan: ? ?AKI; hypernatremia  ?- Presents with fatigue, loss of appetite, and general weakness and found to have mild hypernatremia and SCr of 1.09, up from a baseline of 0.5  ?- Serum CK normal  ?- Likely prerenal in setting of loss of appetite and dehydration ?Patient was hydrated with improvement in renal function.  Sodium level is stable. ?Has been urinating.  No significant pedal edema noted.  ?Continue to monitor. ? ?Fever ?Noted to be febrile overnight.  WBC is normal.  COVID-19 and influenza PCR negative. CRP elevated at 6.7.  Lactic acid 2.2.  Procalcitonin 0.14. ?We will also proceed with strep screen due to complaints of sore throat.  No obvious rash identified in the back of her throat.  We will also do respiratory viral panel.  We will do a CT scan of her neck. ?Lower extremity Doppler studies. ?Chest x-ray was repeated and shows trace right-sided pleural effusion. ?She does have a cough with yellowish expectoration.  Possibly due to bronchitis.  CT scan done a few days ago did suggest bronchiectasis. ?We will treat her with Levaquin for now. ? ?Cough ?A CT chest was done at the time of admission which does not show any airspace disease.  Minimal bronchiectasis was noted.  She was prescribed Mucinex.  Now developed fever.  Initiate Levaquin as discussed above. ? ?General weakness, debility  ?- Presents with slowly progressive general weakness since returning home from SNF ~2  wks ago, no longer able to ambulate with her walker and now requiring assistance to get up from chair or bed   ?- Serum CK, ammonia, and sed rate normal in ED  ?-Patient reported family has good intentions to care for pt after return from SNF, however has been logistically unable to take care for pt, resulting in pt essentially being bed bound and not have access to food/drink.  Both her sons work and are  unavailable during the day hours. ?-PT consulted ?  ?CAD; elevated troponin  ?- Troponin mildly elevated in without anginal complaints  ?- Trop trending down; continue ASA, Lipitor, and metoprolol   ?  ?Chronic combined systolic & diastolic CHF  ?- EF was 62% with grade 1 diastolic dysfunction on TTE in February 2023   ?- She was hypovolemic on admission.  Was hydrated.  IV fluids discontinued.  No significant volume overload noted.  Continue to monitor for now. ?  ?Hx of CVA  ?- Continue ASA and statin  ?  ?Lactic acidosis  ?- Lactic acid elevated to 2.4 in ED  ?- Suspect elevated secondary to dehydration ?-LA normalized with IVF ?  ?  ?DVT Prophylaxis: Lovenox ?Code Status: Full code ?Family Communication: Discussed with patient ?Disposition Plan: To be determined ? ?Status is: Inpatient ?Remains inpatient appropriate because: Physical deconditioning, acute kidney injury, unsafe discharge ? ? ? ? ? ?Medications: Scheduled: ? amLODipine  2.5 mg Oral Daily  ? aspirin EC  81 mg Oral Daily  ? atorvastatin  80 mg Oral Daily  ? clotrimazole  1 Applicatorful Vaginal QHS  ? enoxaparin (LOVENOX) injection  40 mg Subcutaneous Q24H  ? escitalopram  10 mg Oral QHS  ? feeding supplement  237 mL Oral BID BM  ? gabapentin  100 mg Oral BID  ? guaiFENesin  600 mg Oral BID  ? insulin aspart  0-5 Units Subcutaneous QHS  ? insulin aspart  0-9 Units Subcutaneous TID WC  ? magic mouthwash  10 mL Oral TID  ? metoprolol tartrate  25 mg Oral BID  ? pantoprazole  40 mg Oral BID  ? sodium chloride flush  3 mL Intravenous Q12H  ? ?Continuous: ?MBT:DHRCBULAGTXMI **OR** acetaminophen, Muscle Rub, ondansetron **OR** ondansetron (ZOFRAN) IV, phenol, senna-docusate, traMADol ? ?Antibiotics: ?Anti-infectives (From admission, onward)  ? ? None  ? ?  ? ? ?Objective: ? ?Vital Signs ? ?Vitals:  ? 07/21/21 0331 07/21/21 0522 07/21/21 0600 07/21/21 0739  ?BP:  123/70  126/74  ?Pulse:    85  ?Resp:  18  (!) 22  ?Temp:  (!) 101.1 ?F (38.4 ?C) 99.9 ?F  (37.7 ?C) 99.2 ?F (37.3 ?C)  ?TempSrc:  Oral Oral Oral  ?SpO2:  93%  92%  ?Weight: 65.3 kg     ?Height:      ? ? ?Intake/Output Summary (Last 24 hours) at 07/21/2021 1017 ?Last data filed at 07/21/2021 0900 ?Gross per 24 hour  ?Intake 240 ml  ?Output 300 ml  ?Net -60 ml  ? ? ?Filed Weights  ? 07/19/21 1430 07/20/21 0257 07/21/21 0331  ?Weight: 64.7 kg 66.4 kg 65.3 kg  ? ? ?General appearance: Awake alert.  In no distress ?Resp: Diminished air entry at the bases with few crackles.  No rhonchi.  No wheezing. ?Cardio: S1-S2 is normal regular.  No S3-S4.  No rubs murmurs or bruit ?GI: Abdomen is soft.  Nontender nondistended.  Bowel sounds are present normal.  No masses organomegaly ?Extremities: Mild edema bilateral lower extremities. ?Neurologic:  Alert and oriented x3.  No focal neurological deficits.  ? ? ? ?Lab Results: ? ?Data Reviewed: I have personally reviewed following labs and reports of the imaging studies ? ?CBC: ?Recent Labs  ?Lab 07/18/21 ?1541 07/19/21 ?0500 07/20/21 ?0149 07/21/21 ?0259  ?WBC 4.1 3.2* 3.3* 6.3  ?HGB 13.5 12.2 12.1 13.6  ?HCT 44.2 39.5 38.0 42.9  ?MCV 98.2 96.1 95.5 96.2  ?PLT 200 169 155 135*  ? ? ? ?Basic Metabolic Panel: ?Recent Labs  ?Lab 07/18/21 ?1541 07/19/21 ?0500 07/20/21 ?0149 07/21/21 ?0259  ?NA 147* 148* 146* 149*  ?K 4.0 3.5 3.8 3.7  ?CL 117* 117* 120* 121*  ?CO2 19* 22 22 19*  ?GLUCOSE 127* 90 101* 93  ?BUN $Rem'16 16 14 12  'lJpZ$ ?CREATININE 1.09* 0.83 0.72 0.73  ?CALCIUM 9.2 9.0 8.6* 9.1  ?MG  --  1.7  --   --   ?PHOS  --  3.2  --   --   ? ? ? ?GFR: ?Estimated Creatinine Clearance: 68.3 mL/min (by C-G formula based on SCr of 0.73 mg/dL). ? ?Liver Function Tests: ?Recent Labs  ?Lab 07/18/21 ?1904 07/19/21 ?0500  ?AST 68* 63*  ?ALT 18 16  ?ALKPHOS 79 76  ?BILITOT 0.8 0.7  ?PROT 8.1 7.2  ?ALBUMIN 2.3* 2.1*  ? ? ? ? ?Recent Labs  ?Lab 07/18/21 ?1830  ?AMMONIA 17  ? ? ? ?Coagulation Profile: ?Recent Labs  ?Lab 07/18/21 ?1904  ?INR 1.1  ? ? ? ?Cardiac Enzymes: ?Recent Labs  ?Lab 07/18/21 ?1904   ?CKTOTAL 70  ? ? ? ? ? ?CBG: ?Recent Labs  ?Lab 07/20/21 ?0612 07/20/21 ?1200 07/20/21 ?1615 07/20/21 ?2037 07/21/21 ?6160  ?GLUCAP 95 168* 123* 80 117*  ? ? ? ? ?Thyroid Function Tests: ?Recent Labs  ?  05/0

## 2021-07-21 NOTE — Progress Notes (Signed)
?   07/21/21 0120  ?Assess: MEWS Score  ?Temp (!) 102.6 ?F (39.2 ?C)  ?Resp (!) 24  ?Assess: MEWS Score  ?MEWS Temp 2  ?MEWS Systolic 0  ?MEWS Pulse 0  ?MEWS RR 1  ?MEWS LOC 0  ?MEWS Score 3  ?MEWS Score Color Yellow  ?Assess: if the MEWS score is Yellow or Red  ?Were vital signs taken at a resting state? Yes  ?Focused Assessment No change from prior assessment  ?Early Detection of Sepsis Score *See Row Information* Medium  ?MEWS guidelines implemented *See Row Information* Yes  ?Treat  ?MEWS Interventions Administered prn meds/treatments  ?Pain Scale 0-10  ?Pain Score 0  ?Take Vital Signs  ?Increase Vital Sign Frequency  Yellow: Q 2hr X 2 then Q 4hr X 2, if remains yellow, continue Q 4hrs  ?Escalate  ?MEWS: Escalate Yellow: discuss with charge nurse/RN and consider discussing with provider and RRT  ?Notify: Charge Nurse/RN  ?Name of Charge Nurse/RN Notified Josph Macho  ?Date Charge Nurse/RN Notified 07/21/21  ?Time Charge Nurse/RN Notified 0131  ?Notify: Provider  ?Provider Name/Title Dr. Jeannine Kitten  ?Date Provider Notified 07/21/21  ?Time Provider Notified [phone]  ?Notification Type Page  ?Notification Reason Change in status  ?Provider response Evaluate remotely  ?Date of Provider Response 07/21/21  ?Document  ?Patient Outcome Not stable and remains on department  ? ?Orders received for resp panel, UA, blood cultures. Fever trending down after Tylenol administered. ?

## 2021-07-21 NOTE — Progress Notes (Signed)
HOSPITAL MEDICINE OVERNIGHT EVENT NOTE   ? ?Notified by nursing that patient has had fever of 102.6 ?F.  Tylenol has been administered. ? ?Nursing reports that patient is complaining of an ongoing nonproductive cough that has been present since admission.  Patient denies any other focal symptoms. ? ?Chart reviewed, patient is presented with vague symptoms on arrival with initial concerns for infection however initial work-up was negative.  Now with this substantial fever I will repeat the work-up which will include COVID-19 PCR testing, chest x-ray, urinalysis, blood cultures, procalcitonin, CRP.   ? ?Antibiotics will not be initiated at this time unless fever recurs more the work-up listed above identifies an obvious source of infection. ? ?[name] Emerald  MD ?[facility]  ? ? ? ? ? ? ? ? ? ? ?

## 2021-07-21 NOTE — Social Work (Signed)
?  CSW was alerted about pt's recommendation of SNF by PT. PT was unsure of pt's insurance eligibility due to pt just returning home 2 weeks ago from SNF. PT's insurance SNF days would need to be verified. Pt was at [facility]. ?

## 2021-07-21 NOTE — Evaluation (Addendum)
Physical Therapy Evaluation ?Patient Details ?Name: [name] [name] ?MRN: [id number] ?DOB: [birth year] ?Today's Date: 07/21/2021 ? ?History of Present Illness ? Pt is a 61 y.o. F who presents with generalized weakness, fatigue, and loss of appetite 07/18/2021. Pt found to have AKI, hypernamtremia, fever. Significant PMH: depression, anxiety, CAD, type 2 diabetes mellitus, chronic combined systolic and diastolic CHF, thyroid nodules, and history of psoriatic arthritis  ?Clinical Impression ? PTA, pt with recent discharge from SNF to apartment with son with 19 steps to enter. Pt reports once she "returned to the apartment, I stayed." Pt reports difficulty with bed mobility, transfers, ambulation and ADL's. Pt presents with generalized weakness, pain, right foot drop, impaired standing balance, gait abnormalities. Pt requiring min assist for transfers and ambulating ~10 ft with a walker, utilizing a step to pattern. Pt reports both sons work and are unable to assist during the day. Would benefit from post acute rehab to address deficits and maximize functional mobility. If unable to receive, would benefit from HHPT/OT, [place] aide, wheelchair and hospital bed.  ?   ? ?Recommendations for follow up therapy are one component of a multi-disciplinary discharge planning process, led by the attending physician.  Recommendations may be updated based on patient status, additional functional criteria and insurance authorization. ? ?Follow Up Recommendations Skilled nursing-short term rehab (<3 hours/day) ? ?  ?Assistance Recommended at Discharge Frequent or constant Supervision/Assistance  ?Patient can return home with the following ? A little help with walking and/or transfers;A little help with bathing/dressing/bathroom;Assistance with cooking/housework;Help with stairs or ramp for entrance;Assist for transportation ? ?  ?Equipment Recommendations Hospital bed;Wheelchair (measurements PT);Wheelchair cushion (measurements PT)   ?Recommendations for Other Services ?    ?  ?Functional Status Assessment Patient has had a recent decline in their functional status and demonstrates the ability to make significant improvements in function in a reasonable and predictable amount of time.  ? ?  ?Precautions / Restrictions Precautions ?Precautions: Fall ?Restrictions ?Weight Bearing Restrictions: No  ? ?  ? ?Mobility ? Bed Mobility ?Overal bed mobility: Needs Assistance ?Bed Mobility: Supine to Sit ?  ?  ?Supine to sit: Min guard ?  ?  ?General bed mobility comments: HOB elevated 55 degrees, heavy use of bed rail, increased time/effort to power up. ?  ? ?Transfers ?Overall transfer level: Needs assistance ?Equipment used: Rolling walker (2 wheels) ?Transfers: Sit to/from Stand ?Sit to Stand: Min assist ?  ?  ?  ?  ?  ?General transfer comment: MinA to boost up to standing position ?  ? ?Ambulation/Gait ?Ambulation/Gait assistance: Min assist ?Gait Distance (Feet): 10 Feet ?Assistive device: Rolling walker (2 wheels) ?Gait Pattern/deviations: Step-to pattern, Decreased dorsiflexion - right, Decreased stride length ?Gait velocity: decreased ?Gait velocity interpretation: <1.8 ft/sec, indicate of risk for recurrent falls ?  ?General Gait Details: Pt with decreased R heel strike at initial contact, step to pattern, minA for balance ? ?Stairs ?  ?  ?  ?  ?  ? ?Wheelchair Mobility ?  ? ?Modified Rankin (Stroke Patients Only) ?  ? ?  ? ?Balance Overall balance assessment: Needs assistance ?Sitting-balance support: Feet supported ?Sitting balance-Leahy Scale: Fair ?  ?  ?Standing balance support: Bilateral upper extremity supported ?Standing balance-Leahy Scale: Poor ?Standing balance comment: reliant on RW ?  ?  ?  ?  ?  ?  ?  ?  ?  ?  ?  ?   ? ? ? ?Pertinent Vitals/Pain Pain Assessment ?Pain Assessment: Faces ?Faces Pain Scale:  Hurts little more ?Pain Location: generalized ?Pain Descriptors / Indicators: Grimacing, Guarding, Discomfort ?Pain  Intervention(s): Limited activity within patient's tolerance, Monitored during session  ? ? ?Home Living Family/patient expects to be discharged to:: Private residence ?Living Arrangements: Children ?Available Help at Discharge: Family ?Type of Home: Apartment ?Home Access: Stairs to enter ?  ?Entrance Stairs-Number of Steps: 19 ?  ?Home Layout: One level ?Home Equipment: Rollator (4 wheels);BSC/3in1;Rolling Walker (2 wheels) ?   ?  ?Prior Function Prior Level of Function : Needs assist ?  ?  ?  ?  ?  ?  ?Mobility Comments: pt reports "Once I got into the apartment I stayed." Pt reporting difficulty with bed mobility, transfers ?ADLs Comments: having increased difficulty ?  ? ? ?Hand Dominance  ? Dominant Hand: Right ? ?  ?Extremity/Trunk Assessment  ? Upper Extremity Assessment ?Upper Extremity Assessment: Generalized weakness ?  ? ?Lower Extremity Assessment ?Lower Extremity Assessment: RLE deficits/detail;LLE deficits/detail ?RLE Deficits / Details: Grossly 2/5 strength, except ankle dorsiflexion 0/5 ?LLE Deficits / Details: Grossly 2/5 strength ?  ? ?   ?Communication  ? Communication: No difficulties  ?Cognition Arousal/Alertness: Awake/alert ?Behavior During Therapy: [facility] for tasks assessed/performed ?Overall Cognitive Status: No family/caregiver present to determine baseline cognitive functioning ?  ?  ?  ?  ?  ?  ?  ?  ?  ?  ?  ?  ?  ?  ?  ?  ?General Comments: Pt questionable historian, follows commands ?  ?  ? ?  ?General Comments   ? ?  ?Exercises    ? ?Assessment/Plan  ?  ?PT Assessment Patient needs continued PT services  ?PT Problem List Decreased strength;Decreased activity tolerance;Decreased balance;Decreased mobility;Decreased cognition;Decreased safety awareness;Pain ? ?   ?  ?PT Treatment Interventions Gait training;DME instruction;Functional mobility training;Stair training;Therapeutic activities;Balance training;Therapeutic exercise;Patient/family education   ? ?PT Goals (Current goals can be  found in the Care Plan section)  ?Acute Rehab PT Goals ?Patient Stated Goal: get stronger ?PT Goal Formulation: With patient ?Time For Goal Achievement: 08/04/21 ?Potential to Achieve Goals: Good ? ?  ?Frequency Min 3X/week ?  ? ? ?Co-evaluation   ?  ?  ?  ?  ? ? ?  ?AM-PAC PT "6 Clicks" Mobility  ?Outcome Measure Help needed turning from your back to your side while in a flat bed without using bedrails?: A Little ?Help needed moving from lying on your back to sitting on the side of a flat bed without using bedrails?: A Little ?Help needed moving to and from a bed to a chair (including a wheelchair)?: A Little ?Help needed standing up from a chair using your arms (e.g., wheelchair or bedside chair)?: A Little ?Help needed to walk in hospital room?: A Lot ?Help needed climbing 3-5 steps with a railing? : Total ?6 Click Score: 15 ? ?  ?End of Session Equipment Utilized During Treatment: Gait belt ?Activity Tolerance: Patient tolerated treatment well ?Patient left: in bed;with call bell/phone within reach;with bed alarm set ?Nurse Communication: Mobility status ?PT Visit Diagnosis: Unsteadiness on feet (R26.81);Muscle weakness (generalized) (M62.81);Difficulty in walking, not elsewhere classified (R26.2) ?  ? ?Time: 1245-8099 ?PT Time Calculation (min) (ACUTE ONLY): 31 min ? ? ?Charges:   PT Evaluation ?$PT Eval Moderate Complexity: 1 Mod ?PT Treatments ?$Therapeutic Activity: 8-22 mins ?  ?   ? ? ?[name] [name], PT, DPT ?Acute Rehabilitation Services ?Pager [phone] ?Office [phone] ? ? ?[name] [name] ?07/21/2021, 3:53 PM ? ?

## 2021-07-22 ENCOUNTER — Encounter: Payer: Self-pay | Admitting: Nurse Practitioner

## 2021-07-22 DIAGNOSIS — I5042 Chronic combined systolic (congestive) and diastolic (congestive) heart failure: Secondary | ICD-10-CM | POA: Diagnosis not present

## 2021-07-22 DIAGNOSIS — N179 Acute kidney failure, unspecified: Secondary | ICD-10-CM | POA: Diagnosis not present

## 2021-07-22 DIAGNOSIS — Z8673 Personal history of transient ischemic attack (TIA), and cerebral infarction without residual deficits: Secondary | ICD-10-CM | POA: Diagnosis not present

## 2021-07-22 DIAGNOSIS — I25708 Atherosclerosis of coronary artery bypass graft(s), unspecified, with other forms of angina pectoris: Secondary | ICD-10-CM | POA: Diagnosis not present

## 2021-07-22 DIAGNOSIS — L8990 Pressure ulcer of unspecified site, unspecified stage: Secondary | ICD-10-CM

## 2021-07-22 LAB — CBC
HCT: 40.6 % (ref 36.0–46.0)
Hemoglobin: 13.1 g/dL (ref 12.0–15.0)
MCH: 30.7 pg (ref 26.0–34.0)
MCHC: 32.3 g/dL (ref 30.0–36.0)
MCV: 95.1 fL (ref 80.0–100.0)
Platelets: 128 10*3/uL — ABNORMAL LOW (ref 150–400)
RBC: 4.27 MIL/uL (ref 3.87–5.11)
RDW: 16.6 % — ABNORMAL HIGH (ref 11.5–15.5)
WBC: 6 10*3/uL (ref 4.0–10.5)
nRBC: 0 % (ref 0.0–0.2)

## 2021-07-22 LAB — GLUCOSE, CAPILLARY
Glucose-Capillary: 122 mg/dL — ABNORMAL HIGH (ref 70–99)
Glucose-Capillary: 90 mg/dL (ref 70–99)
Glucose-Capillary: 167 mg/dL — ABNORMAL HIGH (ref 70–99)
Glucose-Capillary: 149 mg/dL — ABNORMAL HIGH (ref 70–99)

## 2021-07-22 LAB — BASIC METABOLIC PANEL
Anion gap: 4 — ABNORMAL LOW (ref 5–15)
BUN: 14 mg/dL (ref 8–23)
CO2: 23 mmol/L (ref 22–32)
Calcium: 8.8 mg/dL — ABNORMAL LOW (ref 8.9–10.3)
Chloride: 121 mmol/L — ABNORMAL HIGH (ref 98–111)
Creatinine, Ser: 0.68 mg/dL (ref 0.44–1.00)
GFR, Estimated: >60 mL/min (ref >60)
Glucose, Bld: 93 mg/dL (ref 70–99)
Potassium: 3.6 mmol/L (ref 3.5–5.1)
Sodium: 148 mmol/L — ABNORMAL HIGH (ref 135–145)

## 2021-07-22 MED ORDER — FLUCONAZOLE 150 MG PO TABS
150.0000 mg | ORAL_TABLET | Freq: Every day | ORAL | Status: DC
  Administered 2021-07-22 – 2021-07-29 (×8): 150 mg via ORAL
  Filled 2021-07-22 (×8): qty 1

## 2021-07-22 MED ORDER — DEXTROSE 5 % IV SOLN: INTRAVENOUS | Status: DC

## 2021-07-22 MED ORDER — LEVOFLOXACIN 750 MG PO TABS
750.0000 mg | ORAL_TABLET | Freq: Every day | ORAL | Status: AC
  Administered 2021-07-23 – 2021-07-25 (×3): 750 mg via ORAL
  Filled 2021-07-22 (×3): qty 1

## 2021-07-22 MED ORDER — DEXTROSE-NACL 5-0.45 % IV SOLN: INTRAVENOUS | Status: DC

## 2021-07-22 NOTE — Progress Notes (Addendum)
?Progress Note ? ?Patient: [name] [name] UJW:[id number] DOB: 1960-05-06  ?DOA: 07/18/2021  DOS: 07/22/2021  ?  ?Brief hospital course: ?61 y.o. female with medical history significant for depression, anxiety, CAD, type 2 diabetes mellitus, chronic combined systolic and diastolic CHF, thyroid nodules, and history of psoriatic arthritis, presenting to the emergency department with general weakness, fatigue, aches, and loss of appetite.  Patient reports that she returned home from SNF approximately 2 weeks ago, has been working with home health PT, but has become progressively weak in general, fatigued, and has lost her appetite.  She had been ambulating with a walker but is now unable to do so, and is now also requiring assistance to get up from a chair or from her bed.  She has been experiencing aching pain in her back, shoulders, hips, legs, and chest that has insidiously worsened over the past week or 2.  She has been taking tramadol with some relief.  Denies fevers, chills, abdominal pain, or vomiting.  She had a loose stool couple days ago but none since.  She has history of thiamine deficiency but has been taking thiamine tablets ? ?Upon arrival to the ED, patient is found to be afebrile and saturating mid 90s on room air with stable blood pressure.  EKG features sinus tachycardia with PVCs.  CT chest notable for trace right pleural effusion with minimal pleural thickening but no acute airspace disease.  Also noted on CT is a punctate left upper lobe nodule.  Chemistry panel features a sodium 147, bicarbonate 19, creatinine 1.09, and AST 68.  Lactic acid is 2.4 and troponin 46.  Ammonia, ESR, and CK were normal.  Patient was given IV fluids and acetaminophen in the ED. ? ?Assessment and Plan: ?AKI; hypernatremia: Presents with fatigue, loss of appetite, and general weakness and found to have mild hypernatremia and SCr of 1.09, up from a baseline of 0.5  ?- Has continued improving, though SCr baseline 0.5, still  slightly above that. Will restart IVF with 1/2NS and monitor in AM. No edema or crackles noted. ?- Serum CK normal  ?- Likely prerenal in setting of loss of appetite and dehydration ? ?Fever: Isolated overnight into 5/7. WBC remains normal.  COVID-19 and influenza PCR negative. CRP elevated at 6.7.  Lactic acid 2.2.  Procalcitonin 0.14. GAS screen negative. LE venous U/S negative for DVT.  ?- Tx bronchiectasis empirically with levaquin.  ?- Add anaerobic coverage for dental caries with periapical lucencies on CT if fever recurs. Monitoring blood cultures draw at time of fever. Add sputum Cx as she's having some production.  ?- No deep infection on CT of the neck. Possibly candidal esophagitis based on symptom report. Will initiate fluconazole.   ?  ?Cough: A CT chest was done at the time of admission which does not show any airspace disease.  Minimal bronchiectasis was noted.  She was prescribed Mucinex. ?- Levaquin started, check sputum Cx as detailed above. ?  ?General weakness, debility: Presents with slowly progressive general weakness since returning home from SNF ~2 wks ago, no longer able to ambulate with her walker and now requiring assistance to get up from chair or bed  Serum CK, ammonia, and sed rate normal in ED  ?- Patient reported family has good intentions to care for pt after return from SNF, however has been logistically unable to take care for pt, resulting in pt essentially being bed bound and not have access to food/drink. Both her sons work and are unavailable during  the day hours. Suspect she would be best served/safest at SNF. CSW consulted. Pt amenable.  ?- Continue PT as much as feasible. ?  ?CAD; elevated troponin without anginal complaints, trending downward.  ?- Defer further work up at this time.  ?- Continue ASA, lipitor, and metoprolol   ?  ?Chronic combined systolic & diastolic CHF: EF was 40% with grade 1 diastolic dysfunction on TTE in February 2023   ?- She was hypovolemic on  admission, still slightly volume down, possibly exacerbated poor po intake due to odynophagia. No significant volume overload noted. Restart IVF for now. ?  ?Hx of CVA  ?- Continue ASA and statin  ? ?Thrombocytopenia: Unclear etiology, recheck to confirm stability. No bleeding, can continue VTE ppx. ?  ?Lactic acidosis: Due to dehydration. Resolved with IVF.  ? ?The following 2 pressure injuries:  ?Pressure Injury 07/19/21 Thigh Anterior;Proximal;Right Stage 2 -  Partial thickness loss of dermis presenting as a shallow open injury with a red, pink wound bed without slough. (Active)  ?07/19/21 2300  ?Location: Thigh  ?Location Orientation: Anterior;Proximal;Right  ?Staging: Stage 2 -  Partial thickness loss of dermis presenting as a shallow open injury with a red, pink wound bed without slough.  ?Wound Description (Comments):   ?Present on Admission: No  ?Dressing Type Foam - Lift dressing to assess site every shift 07/21/21 2000  ?   ?Pressure Injury 07/20/21 Coccyx Medial Stage 2 -  Partial thickness loss of dermis presenting as a shallow open injury with a red, pink wound bed without slough. (Active)  ?07/20/21 2300  ?Location: Coccyx  ?Location Orientation: Medial  ?Staging: Stage 2 -  Partial thickness loss of dermis presenting as a shallow open injury with a red, pink wound bed without slough.  ?Wound Description (Comments):   ?Present on Admission: No  ?Dressing Type Foam - Lift dressing to assess site every shift 07/21/21 2000  ? ?Subjective: Feels diffusely terribly weak from her feet, legs, arms, up to her ears, she says. Has been getting more painful burning when swallowing. Coughing up a bit more, yellow at times. Doesn't always come up. No dyspnea or chest pain at this time. Has been told all her teeth need to be pulled in the past. No change in mouth pain. ? ?Objective: ?Vitals:  ? 07/22/21 0400 07/22/21 0437 07/22/21 0720 07/22/21 1134  ?BP: 137/79  134/75 118/78  ?Pulse:   69 88  ?Resp: 19  19 (!) 22   ?Temp: 98.2 ?F (36.8 ?C)  98.1 ?F (36.7 ?C)   ?TempSrc: Oral  Oral   ?SpO2: 94%  94% 92%  ?Weight:  64.1 kg    ?Height:      ? ?Gen: Chronically ill-appearing 61 y.o. female in no distress ?HEENT: Poor dentition, caries throughout, no bulging abscess. ?Pulm: Nonlabored breathing room air. Decreased but without wheezes or crackles. ?CV: Regular rate and rhythm. No murmur, rub, or gallop. No JVD, no dependent edema. ?GI: Abdomen soft, non-tender, non-distended, with normoactive bowel sounds.  ?Ext: Warm, no deformities ?Skin: No rashes, lesions or ulcers on visualized skin. ?Neuro: Alert and oriented. No focal neurological deficits. ?Psych: Judgement and insight appear fair. Mood euthymic & affect congruent. Behavior is appropriate.   ? ?Data Personally reviewed: ?CBC: ?Recent Labs  ?Lab 07/18/21 ?1541 07/19/21 ?0500 07/20/21 ?0149 07/21/21 ?0259 07/22/21 ?0415  ?WBC 4.1 3.2* 3.3* 6.3 6.0  ?HGB 13.5 12.2 12.1 13.6 13.1  ?HCT 44.2 39.5 38.0 42.9 40.6  ?MCV 98.2 96.1 95.5 96.2 95.1  ?  PLT 200 169 155 135* 128*  ? ?Basic Metabolic Panel: ?Recent Labs  ?Lab 07/18/21 ?1541 07/19/21 ?0500 07/20/21 ?0149 07/21/21 ?0259 07/22/21 ?0415  ?NA 147* 148* 146* 149* 148*  ?K 4.0 3.5 3.8 3.7 3.6  ?CL 117* 117* 120* 121* 121*  ?CO2 19* 22 22 19* 23  ?GLUCOSE 127* 90 101* 93 93  ?BUN _0 ?CREATININE 1.09* 0.83 0.72 0.73 0.68  ?CALCIUM 9.2 9.0 8.6* 9.1 8.8*  ?MG  --  1.7  --   --   --   ?PHOS  --  3.2  --   --   --   ? ?GFR: ?Estimated Creatinine Clearance: 68.3 mL/min (by C-G formula based on SCr of 0.68 mg/dL). ?Liver Function Tests: ?Recent Labs  ?Lab 07/18/21 ?1904 07/19/21 ?0500  ?AST 68* 63*  ?ALT 18 16  ?ALKPHOS 79 76  ?BILITOT 0.8 0.7  ?PROT 8.1 7.2  ?ALBUMIN 2.3* 2.1*  ? ?No results for input(s): LIPASE, AMYLASE in the last 168 hours. ?Recent Labs  ?Lab 07/18/21 ?1830  ?AMMONIA 17  ? ?Coagulation Profile: ?Recent Labs  ?Lab 07/18/21 ?1904  ?INR 1.1  ? ?Cardiac Enzymes: ?Recent Labs  ?Lab 07/18/21 ?1904  ?CKTOTAL 70   ? ?BNP (last 3 results) ?No results for input(s): PROBNP in the last 8760 hours. ?HbA1C: ?No results for input(s): HGBA1C in the last 72 hours. ?CBG: ?Recent Labs  ?Lab 07/21/21 ?1229 07/21/21 ?1533 07/21/21

## 2021-07-22 NOTE — Evaluation (Signed)
Occupational Therapy Evaluation Patient Details Name: [name] [name] MRN: [id number] DOB: Jan 29, 1961 Today's Date: 07/22/2021   History of Present Illness Pt is a 61 y.o. F who presents with generalized weakness, fatigue, and loss of appetite 07/18/2021. Pt found to have AKI, hypernamtremia, fever. Significant PMH: depression, anxiety, CAD, type 2 diabetes mellitus, chronic combined systolic and diastolic CHF, thyroid nodules, and history of psoriatic arthritis   Clinical Impression   Prior to admission, patient was living with her son in an apartment with 19 steps to enter. Patient in tearful and painful state, stating "I needed help with everything". Patient is currently requiring mod A for transfers and ADLs, and maximum encouragement in order to participate. Patient benefiting from cues to manage anxiety and breaking down tasks into more manageable chunks for participation. Patient's sister/cousin stating that patient has been like this since she has been home from rehab, and has lost a lot of weight due to minimal consumption. OT recommending SNF rehab in order to progress towards goals. OT will continue to follow acutely.      Recommendations for follow up therapy are one component of a multi-disciplinary discharge planning process, led by the attending physician.  Recommendations may be updated based on patient status, additional functional criteria and insurance authorization.   Follow Up Recommendations  Skilled nursing-short term rehab (<3 hours/day)    Assistance Recommended at Discharge Frequent or constant Supervision/Assistance  Patient can return home with the following A lot of help with walking and/or transfers;A lot of help with bathing/dressing/bathroom;Assistance with cooking/housework;Direct supervision/assist for medications management;Direct supervision/assist for financial management;Assist for transportation;Help with stairs or ramp for entrance    Functional Status  Assessment  Patient has had a recent decline in their functional status and demonstrates the ability to make significant improvements in function in a reasonable and predictable amount of time.  Equipment Recommendations  Wheelchair (measurements OT);Hospital bed    Recommendations for Other Services       Precautions / Restrictions Precautions Precautions: Fall Restrictions Weight Bearing Restrictions: No      Mobility Bed Mobility               General bed mobility comments: Up in chair upon arrival    Transfers Overall transfer level: Needs assistance Equipment used: Rolling walker (2 wheels) Transfers: Sit to/from Stand Sit to Stand: Mod assist           General transfer comment: Multiple attempts to power up into standing, with patient demonstrating minimal activation in quads to power up, patient requring mod A to come into standing, significant difficulty to sway back and forth, and tactile cues to motor plan to take steps backwards to the chair to complete a controlled sit      Balance Overall balance assessment: Needs assistance Sitting-balance support: Bilateral upper extremity supported, Feet supported Sitting balance-Leahy Scale: Poor   Postural control: Left lateral lean Standing balance support: Bilateral upper extremity supported, During functional activity, Reliant on assistive device for balance Standing balance-Leahy Scale: Poor Standing balance comment: reliant on RW                           ADL either performed or assessed with clinical judgement   ADL Overall ADL's : Needs assistance/impaired Eating/Feeding: Set up;Sitting   Grooming: Set up;Sitting   Upper Body Bathing: Minimal assistance;Sitting   Lower Body Bathing: Moderate assistance;Maximal assistance;Sitting/lateral leans;Sit to/from stand   Upper Body Dressing : Minimal assistance;Sitting  Lower Body Dressing: Moderate assistance;Maximal  assistance;Sitting/lateral leans;Sit to/from stand   Toilet Transfer: Moderate assistance;Stand-pivot;BSC/3in1 Toilet Transfer Details (indicate cue type and reason): simlulated with sit<>stands in recliner Toileting- Clothing Manipulation and Hygiene: Moderate assistance;Sitting/lateral lean;Sit to/from stand       Functional mobility during ADLs: Moderate assistance;Cueing for safety;Cueing for sequencing;Rolling walker (2 wheels) General ADL Comments: Patient presenting with significant decrease in activity tolerance, fatigue, pain, and anxiety with all movement     Vision Baseline Vision/History: 0 No visual deficits Ability to See in Adequate Light: 0 Adequate Patient Visual Report: No change from baseline       Perception     Praxis      Pertinent Vitals/Pain Pain Assessment Pain Assessment: Faces Faces Pain Scale: Hurts whole lot Pain Location: generalized Pain Descriptors / Indicators: Grimacing, Guarding, Discomfort, Crying Pain Intervention(s): Limited activity within patient's tolerance, Monitored during session, Repositioned     Hand Dominance Right   Extremity/Trunk Assessment Upper Extremity Assessment Upper Extremity Assessment: Generalized weakness   Lower Extremity Assessment Lower Extremity Assessment: Defer to PT evaluation   Cervical / Trunk Assessment Cervical / Trunk Assessment: Kyphotic   Communication Communication Communication: No difficulties   Cognition Arousal/Alertness: Lethargic Behavior During Therapy: Anxious, Flat affect Overall Cognitive Status: Impaired/Different from baseline Area of Impairment: Following commands, Safety/judgement, Problem solving                       Following Commands: Follows one step commands with increased time, Follows multi-step commands with increased time Safety/Judgement: Decreased awareness of safety, Decreased awareness of deficits   Problem Solving: Slow processing, Decreased  initiation, Difficulty sequencing, Requires verbal cues, Requires tactile cues General Comments: Sister/cousin present for session, states that this is how she has been lately, refusing to eat, and complaining of pain no matter her positioning, requiring max redirection in order to follow commands due to anxiety     General Comments       Exercises     Shoulder Instructions      Home Living Family/patient expects to be discharged to:: Private residence Living Arrangements: Children Available Help at Discharge: Family Type of Home: Apartment Home Access: Stairs to enter Secretary/administrator of Steps: 19   Home Layout: One level     Bathroom Shower/Tub: Producer, television/film/video: Standard Bathroom Accessibility: Yes   Home Equipment: Rollator (4 wheels);BSC/3in1;Rolling Environmental consultant (2 wheels)          Prior Functioning/Environment Prior Level of Function : Needs assist             Mobility Comments: pt reports "Once I got into the apartment I stayed." Pt reporting difficulty with bed mobility, transfers ADLs Comments: "I need help with everything"        OT Problem List: Decreased strength;Decreased range of motion;Decreased activity tolerance;Impaired balance (sitting and/or standing);Decreased coordination;Decreased cognition;Decreased safety awareness;Decreased knowledge of use of DME or AE;Decreased knowledge of precautions;Pain;Increased edema      OT Treatment/Interventions: Self-care/ADL training;Therapeutic exercise;Energy conservation;DME and/or AE instruction;Manual therapy;Therapeutic activities;Patient/family education;Balance training;Cognitive remediation/compensation    OT Goals(Current goals can be found in the care plan section) Acute Rehab OT Goals Patient Stated Goal: to be in less pain OT Goal Formulation: With patient Time For Goal Achievement: 08/05/21 Potential to Achieve Goals: Fair ADL Goals Pt Will Perform Lower Body Bathing: with  min assist;sitting/lateral leans;sit to/from stand;with adaptive equipment Pt Will Perform Lower Body Dressing: with min assist;with adaptive equipment;sitting/lateral leans;sit to/from stand Pt  Will Transfer to Toilet: with min assist;ambulating Pt/caregiver will Perform Home Exercise Program: Increased ROM;Increased strength;Both right and left upper extremity;With theraband;With minimal assist;With written HEP provided Additional ADL Goal #1: Patient will demonstrate increased activity tolerance to complete functional task in standing for 2-4 minutes without need for seated rest breaks. Additional ADL Goal #2: Patient will be able to verbalize 3 strategies to manage anxiety prior to movement and optimize participation.  OT Frequency: Min 2X/week    Co-evaluation              AM-PAC OT "6 Clicks" Daily Activity     Outcome Measure Help from another person eating meals?: A Little Help from another person taking care of personal grooming?: A Little Help from another person toileting, which includes using toliet, bedpan, or urinal?: A Lot Help from another person bathing (including washing, rinsing, drying)?: A Lot Help from another person to put on and taking off regular upper body clothing?: A Little Help from another person to put on and taking off regular lower body clothing?: A Lot 6 Click Score: 15   End of Session Equipment Utilized During Treatment: Gait belt;Rolling walker (2 wheels) Nurse Communication: Mobility status  Activity Tolerance: Patient limited by lethargy;Patient limited by pain;Patient limited by fatigue Patient left: in chair;with call bell/phone within reach;with chair alarm set;with family/visitor present  OT Visit Diagnosis: Unsteadiness on feet (R26.81);Other abnormalities of gait and mobility (R26.89);Muscle weakness (generalized) (M62.81);Adult, failure to thrive (R62.7);Pain Pain - Right/Left:  (Generalized)                Time: 4098-1191 OT Time  Calculation (min): 28 min Charges:  OT General Charges $OT Visit: 1 Visit OT Evaluation $OT Eval Moderate Complexity: 1 Mod OT Treatments $Self Care/Home Management : 8-22 mins  Pollyann Glen E. Dshawn Mcnay, OTR/L Acute Rehabilitation Services [phone]   Cherlyn Cushing 07/22/2021, 2:08 PM

## 2021-07-22 NOTE — Progress Notes (Signed)
PHARMACY NOTE:  ANTIMICROBIAL RENAL DOSAGE ADJUSTMENT ? ?Current antimicrobial regimen includes a mismatch between antimicrobial dosage and estimated renal function.  As per policy approved by the Pharmacy & Therapeutics and Medical Executive Committees, the antimicrobial dosage will be adjusted accordingly. ? ?Current antimicrobial dosage:  levofloxacin '500mg'$  daily  ? ?Indication: bronchiectasis ? ?Renal Function: ? ?Estimated Creatinine Clearance: 68.3 mL/min (by C-G formula based on SCr of 0.68 mg/dL).  ?   ?Antimicrobial dosage has been changed to:  '750mg'$  daily x 5 d per MD  ?  ? ?Thank you for allowing pharmacy to be a part of this patient's care. ? ?Benetta Spar, PharmD, BCPS, BCCP ?Clinical Pharmacist ? ?Please check AMION for all [place] phone numbers ?After 10:00 PM, call [place] [phone] ? ?

## 2021-07-22 NOTE — Care Management Important Message (Signed)
Important Message ? ?Patient Details  ?Name: [name] [name] ?MRN: [id number] ?Date of Birth: Apr 05, 1960 ? ? ?Medicare Important Message Given:  Yes ? ? ? ? ?[name] ?07/22/2021, 8:28 AM ?

## 2021-07-23 DIAGNOSIS — Z8673 Personal history of transient ischemic attack (TIA), and cerebral infarction without residual deficits: Secondary | ICD-10-CM | POA: Diagnosis not present

## 2021-07-23 DIAGNOSIS — I5042 Chronic combined systolic (congestive) and diastolic (congestive) heart failure: Secondary | ICD-10-CM | POA: Diagnosis not present

## 2021-07-23 DIAGNOSIS — N179 Acute kidney failure, unspecified: Secondary | ICD-10-CM | POA: Diagnosis not present

## 2021-07-23 DIAGNOSIS — I25708 Atherosclerosis of coronary artery bypass graft(s), unspecified, with other forms of angina pectoris: Secondary | ICD-10-CM | POA: Diagnosis not present

## 2021-07-23 LAB — VITAMIN B1: Vitamin B1 (Thiamine): 126.9 nmol/L (ref 66.5–200.0)

## 2021-07-23 LAB — CBC
HCT: 37.6 % (ref 36.0–46.0)
Hemoglobin: 11.9 g/dL — ABNORMAL LOW (ref 12.0–15.0)
MCH: 30.4 pg (ref 26.0–34.0)
MCHC: 31.6 g/dL (ref 30.0–36.0)
MCV: 95.9 fL (ref 80.0–100.0)
Platelets: 112 10*3/uL — ABNORMAL LOW (ref 150–400)
RBC: 3.92 MIL/uL (ref 3.87–5.11)
RDW: 16.9 % — ABNORMAL HIGH (ref 11.5–15.5)
WBC: 3.7 10*3/uL — ABNORMAL LOW (ref 4.0–10.5)
nRBC: 0 % (ref 0.0–0.2)

## 2021-07-23 LAB — GLUCOSE, CAPILLARY
Glucose-Capillary: 102 mg/dL — ABNORMAL HIGH (ref 70–99)
Glucose-Capillary: 111 mg/dL — ABNORMAL HIGH (ref 70–99)
Glucose-Capillary: 151 mg/dL — ABNORMAL HIGH (ref 70–99)
Glucose-Capillary: 156 mg/dL — ABNORMAL HIGH (ref 70–99)

## 2021-07-23 LAB — BASIC METABOLIC PANEL
Anion gap: 4 — ABNORMAL LOW (ref 5–15)
BUN: 15 mg/dL (ref 8–23)
CO2: 24 mmol/L (ref 22–32)
Calcium: 8.6 mg/dL — ABNORMAL LOW (ref 8.9–10.3)
Chloride: 119 mmol/L — ABNORMAL HIGH (ref 98–111)
Creatinine, Ser: 0.78 mg/dL (ref 0.44–1.00)
GFR, Estimated: >60 mL/min (ref >60)
Glucose, Bld: 108 mg/dL — ABNORMAL HIGH (ref 70–99)
Potassium: 3.5 mmol/L (ref 3.5–5.1)
Sodium: 147 mmol/L — ABNORMAL HIGH (ref 135–145)

## 2021-07-23 MED ORDER — THIAMINE HCL 100 MG PO TABS
100.0000 mg | ORAL_TABLET | Freq: Every day | ORAL | Status: DC
  Administered 2021-07-23 – 2021-07-29 (×7): 100 mg via ORAL
  Filled 2021-07-23 (×7): qty 1

## 2021-07-23 MED ORDER — FOLIC ACID 1 MG PO TABS
1.0000 mg | ORAL_TABLET | Freq: Every day | ORAL | Status: DC
Start: 2021-07-23 — End: 2021-07-29
  Administered 2021-07-23 – 2021-07-29 (×7): 1 mg via ORAL
  Filled 2021-07-23 (×7): qty 1

## 2021-07-23 MED ORDER — TRIAMCINOLONE ACETONIDE 0.1 % EX CREA
TOPICAL_CREAM | Freq: Three times a day (TID) | CUTANEOUS | Status: DC
  Administered 2021-07-23 – 2021-07-28 (×2): 1 via TOPICAL
  Filled 2021-07-23: qty 15

## 2021-07-23 NOTE — Social Work (Signed)
CSW spoke with [place] at [facility] about pt insurance days.  Kitty explained that pt can return but may have to pay out of pocket 100%. Perrin Smack will check with the insurance to see if pt has anymore days left. CSW will continue to follow for DC. ?

## 2021-07-23 NOTE — Progress Notes (Signed)
Physical Therapy Treatment ?Patient Details ?Name: [name] [name] ?MRN: [id number] ?DOB: [birth year] ?Today's Date: 07/23/2021 ? ? ?History of Present Illness Pt is a 61 y.o. F who presents with generalized weakness, fatigue, and loss of appetite 07/18/2021. Pt found to have AKI, hypernamtremia, fever. Significant PMH: depression, anxiety, CAD, type 2 diabetes mellitus, chronic combined systolic and diastolic CHF, thyroid nodules, and history of psoriatic arthritis ? ?  ?PT Comments  ? ? Pt needs increased encouragement to participate.  PT initiated on 3rd attempt, but refused OOB 2/2 generalized pain and fatigue/weakness.  Pt participated in bed exercises only. Pt very verbose and requires re-direction to task.  ?  ?Recommendations for follow up therapy are one component of a multi-disciplinary discharge planning process, led by the attending physician.  Recommendations may be updated based on patient status, additional functional criteria and insurance authorization. ? ?Follow Up Recommendations ? Skilled nursing-short term rehab (<3 hours/day) ?  ?  ?Assistance Recommended at Discharge Frequent or constant Supervision/Assistance  ?Patient can return home with the following A little help with walking and/or transfers;A little help with bathing/dressing/bathroom;Assistance with cooking/housework;Help with stairs or ramp for entrance;Assist for transportation ?  ?Equipment Recommendations ? Hospital bed;Wheelchair (measurements PT);Wheelchair cushion (measurements PT)  ?  ?Recommendations for Other Services   ? ? ?  ?Precautions / Restrictions Precautions ?Precautions: Fall ?Restrictions ?Weight Bearing Restrictions: No  ?  ? ?Mobility ? Bed Mobility ?Overal bed mobility:  (Pt refused to transfer OOB, only agreeing to bed ex.  Pt states weakness, unable to sleep, pain.) ?  ?  ?  ?  ?  ?  ?  ?  ? ?Transfers ?  ?  ?  ?  ?  ?  ?  ?  ?  ?  ?  ? ?Ambulation/Gait ?  ?  ?  ?  ?  ?  ?  ?  ? ? ?Stairs ?  ?  ?  ?  ?   ? ? ?Wheelchair Mobility ?  ? ?Modified Rankin (Stroke Patients Only) ?  ? ? ?  ?Balance   ?  ?  ?  ?  ?  ?  ?  ?  ?  ?  ?  ?  ?  ?  ?  ?  ?  ?  ?  ? ?  ?Cognition Arousal/Alertness: Awake/alert ?Behavior During Therapy: Anxious ?Overall Cognitive Status: Within Functional Limits for tasks assessed ?  ?  ?  ?  ?  ?  ?  ?  ?  ?  ?  ?  ?Following Commands: Follows one step commands with increased time ?Safety/Judgement: Decreased awareness of deficits ?  ?  ?General Comments: Pt very verbose, requires re-direction to task and reason for therapy. ?  ?  ? ?  ?Exercises General Exercises - Lower Extremity ?Ankle Circles/Pumps: AROM, Left, 20 reps (AAROM to R ankle) ?Quad Sets: AROM, Both, 20 reps ?Hip ABduction/ADduction: AAROM, Both, 20 reps ? ?  ?General Comments   ?  ?  ? ?Pertinent Vitals/Pain Pain Assessment ?Pain Assessment: 0-10 ?Pain Score: 6  ?Pain Location: generalized ?Pain Descriptors / Indicators: Guarding, Grimacing, Discomfort ?Pain Intervention(s): Limited activity within patient's tolerance  ? ? ?Home Living   ?  ?  ?  ?  ?  ?  ?  ?  ?  ?   ?  ?Prior Function    ?  ?  ?   ? ?PT Goals (current goals can now be found in the care  plan section) Acute Rehab PT Goals ?Patient Stated Goal: get stronger ?PT Goal Formulation: With patient ?Time For Goal Achievement: 08/04/21 ?Potential to Achieve Goals: Good ? ?  ?Frequency ? ? ? Min 3X/week ? ? ? ?  ?PT Plan    ? ? ?Co-evaluation   ?  ?  ?  ?  ? ?  ?AM-PAC PT "6 Clicks" Mobility   ?Outcome Measure ? Help needed turning from your back to your side while in a flat bed without using bedrails?: A Little ?Help needed moving from lying on your back to sitting on the side of a flat bed without using bedrails?: A Little ?Help needed moving to and from a bed to a chair (including a wheelchair)?: A Little ?Help needed standing up from a chair using your arms (e.g., wheelchair or bedside chair)?: Total ?Help needed to walk in hospital room?: Total ?Help needed climbing  3-5 steps with a railing? : Total ?6 Click Score: 12 ? ?  ?End of Session   ?Activity Tolerance: Patient limited by pain;Patient limited by fatigue ?Patient left: in bed;with call bell/phone within reach ?Nurse Communication: Mobility status ?PT Visit Diagnosis: Muscle weakness (generalized) (M62.81);Pain ?Pain - part of body:  (generalized 6/10) ?  ? ? ?Time: 1660-6004 ?PT Time Calculation (min) (ACUTE ONLY): 19 min ? ?Charges:  $Therapeutic Activity: 8-22 mins          ?          ? ?Ladoris Gene, PT  ? ? ?Ladoris Gene ?07/23/2021, 2:56 PM ? ?

## 2021-07-23 NOTE — Progress Notes (Signed)
?Progress Note ? ?Patient: [name] [name] VOJ:[id number] DOB: 1960-05-19  ?DOA: 07/18/2021  DOS: 07/23/2021  ?  ?Brief hospital course: ?61 y.o. female with medical history significant for depression, anxiety, CAD, type 2 diabetes mellitus, chronic combined systolic and diastolic CHF, thyroid nodules, and history of psoriatic arthritis, presenting to the emergency department with general weakness, fatigue, aches, and loss of appetite.  Patient reports that she returned home from SNF approximately 2 weeks ago, has been working with home health PT, but has become progressively weak in general, fatigued, and has lost her appetite.  She had been ambulating with a walker but is now unable to do so, and is now also requiring assistance to get up from a chair or from her bed.  She has been experiencing aching pain in her back, shoulders, hips, legs, and chest that has insidiously worsened over the past week or 2.  She has been taking tramadol with some relief.  Denies fevers, chills, abdominal pain, or vomiting.  She had a loose stool couple days ago but none since.  She has history of thiamine deficiency but has been taking thiamine tablets ? ?Upon arrival to the ED, patient is found to be afebrile and saturating mid 90s on room air with stable blood pressure.  EKG features sinus tachycardia with PVCs.  CT chest notable for trace right pleural effusion with minimal pleural thickening but no acute airspace disease.  Also noted on CT is a punctate left upper lobe nodule.  Chemistry panel features a sodium 147, bicarbonate 19, creatinine 1.09, and AST 68.  Lactic acid is 2.4 and troponin 46.  Ammonia, ESR, and CK were normal.  Patient was given IV fluids and acetaminophen in the ED. ? ?Assessment and Plan: ?AKI; hypernatremia: Presents with fatigue, loss of appetite, and general weakness and found to have mild hypernatremia and SCr of 1.09, up from a baseline of 0.5. Serum CK normal  ?- Renal function improved though pt's mucous  membranes remain dry and remains hypernatremic. Will continue IVF cautiously due to hx CHF. No current GI or other overt fluid losses. Will check urine osmolality. ?- Likely prerenal in setting of loss of appetite and dehydration ? ?Fever: Isolated overnight into 5/7. WBC remains normal.  COVID-19 and influenza PCR negative. CRP elevated at 6.7.  Lactic acid 2.2.  Procalcitonin 0.14. GAS screen negative. LE venous U/S negative for DVT.  ?- Tx bronchiectasis empirically with levaquin, dose adjusted upwards due to renal function improvement.  ?- If fever recurs, could consider anaerobic coverage for dental caries with periapical lucencies on CT.  ?- Blood cultures NGTD x2 days.  ?- Sputum Cx sent and pending.  ?- No deep infection on CT of the neck. Possibly candidal esophagitis based on symptom report. Continue mouthwash and added fluconazole.   ?  ?Cough: A CT chest was done at the time of admission which does not show any airspace disease.  Minimal bronchiectasis was noted.  She was prescribed Mucinex. ?- Levaquin started, check sputum Cx as detailed above. ?  ?General weakness, debility: Presents with slowly progressive general weakness since returning home from SNF ~2 wks ago, no longer able to ambulate with her walker and now requiring assistance to get up from chair or bed  Serum CK, ammonia, and sed rate normal in ED  ?- Patient reported family has good intentions to care for pt after return from SNF, however has been logistically unable to take care for pt, resulting in pt essentially being bed bound  and not have access to food/drink. Both her sons work and are unavailable during the day hours. Suspect she would be best served/safest at SNF. CSW consulted. Pt amenable.  ?- Continue PT as much as feasible. ?  ?CAD; elevated troponin without anginal complaints, trending downward.  ?- Defer further work up at this time.  ?- Continue ASA, lipitor, and metoprolol   ?  ?Chronic combined systolic & diastolic CHF: EF  was 73% with grade 1 diastolic dysfunction on TTE in February 2023   ?- She was hypovolemic on admission, still slightly volume down, possibly exacerbated poor po intake due to odynophagia. No significant volume overload noted. Restart IVF for now. ?  ?Hx of CVA  ?- Continue ASA and statin  ? ?Thrombocytopenia: Unclear etiology, though had this during acute illness in February as well.  ?- Will monitor for now. ?  ?Lactic acidosis: Due to dehydration. Resolved with IVF.  ? ?History of thiamine deficiency, marginal folic acid levels at last check:  ?- Will restart supplementation for both at this time.  ? ?The following 2 pressure injuries:  ?Pressure Injury 07/19/21 Thigh Anterior;Proximal;Right Stage 2 -  Partial thickness loss of dermis presenting as a shallow open injury with a red, pink wound bed without slough. (Active)  ?07/19/21 2300  ?Location: Thigh  ?Location Orientation: Anterior;Proximal;Right  ?Staging: Stage 2 -  Partial thickness loss of dermis presenting as a shallow open injury with a red, pink wound bed without slough.  ?Wound Description (Comments):   ?Present on Admission: No  ?Dressing Type Foam - Lift dressing to assess site every shift 07/21/21 2000  ?   ?Pressure Injury 07/20/21 Coccyx Medial Stage 2 -  Partial thickness loss of dermis presenting as a shallow open injury with a red, pink wound bed without slough. (Active)  ?07/20/21 2300  ?Location: Coccyx  ?Location Orientation: Medial  ?Staging: Stage 2 -  Partial thickness loss of dermis presenting as a shallow open injury with a red, pink wound bed without slough.  ?Wound Description (Comments):   ?Present on Admission: No  ?Dressing Type Foam - Lift dressing to assess site every shift 07/21/21 2000  ? ?Subjective: Sore throat/painful swallowing has improved somewhat. Thinks she's drinking a fair amount of water. No fevers. Cough improved. Continues to note she is not strong enough to go home, requiring rehab.  ? ?Objective: ?Vitals:  ?  07/23/21 0107 07/23/21 7106 07/23/21 0735 07/23/21 0957  ?BP: 107/60 118/64 120/61 (!) 153/92  ?Pulse: 65 60 68 76  ?Resp: $Remov'16 16 13   'DnCOMl$ ?Temp: 98.8 ?F (37.1 ?C) 98.8 ?F (37.1 ?C) 98.8 ?F (37.1 ?C)   ?TempSrc: Oral Oral Oral   ?SpO2: 97% 95% 95%   ?Weight:  63.8 kg    ?Height:      ?Gen: Chronically ill-appearing female in no distress ?HEENT: No new oropharyngeal lesions or edema. Continued poor dentition. Membranes are dry.  ?Pulm: Nonlabored breathing room air. Clear. ?CV: Regular rate and rhythm. No murmur, rub, or gallop. No JVD, trace/minimal dependent edema. ?GI: Abdomen soft, non-tender, non-distended, with normoactive bowel sounds.  ?Ext: Warm, no deformities ?Skin: Widespread hyperpigmented pruritic lesions most predominantly on elbows. There is also very shallow wound to left posterior shoulder that pt reports was after scratching. No new rashes, lesions or ulcers on visualized skin. ?Neuro: Alert and oriented. No focal neurological deficits. ?Psych: Judgement and insight appear fair. Mood euthymic & affect congruent. Behavior is appropriate.   ? ?Data Personally reviewed: ?CBC: ?Recent Labs  ?  Lab 07/19/21 ?0500 07/20/21 ?0149 07/21/21 ?0259 07/22/21 ?9941 07/23/21 ?2904  ?WBC 3.2* 3.3* 6.3 6.0 3.7*  ?HGB 12.2 12.1 13.6 13.1 11.9*  ?HCT 39.5 38.0 42.9 40.6 37.6  ?MCV 96.1 95.5 96.2 95.1 95.9  ?PLT 169 155 135* 128* 112*  ? ?Basic Metabolic Panel: ?Recent Labs  ?Lab 07/19/21 ?0500 07/20/21 ?0149 07/21/21 ?0259 07/22/21 ?7533 07/23/21 ?9179  ?NA 148* 146* 149* 148* 147*  ?K 3.5 3.8 3.7 3.6 3.5  ?CL 117* 120* 121* 121* 119*  ?CO2 22 22 19* 23 24  ?GLUCOSE 90 101* 93 93 108*  ?BUN $Rem'16 14 12 14 15  'qvAg$ ?CREATININE 0.83 0.72 0.73 0.68 0.78  ?CALCIUM 9.0 8.6* 9.1 8.8* 8.6*  ?MG 1.7  --   --   --   --   ?PHOS 3.2  --   --   --   --   ? ?GFR: ?Estimated Creatinine Clearance: 68.3 mL/min (by C-G formula based on SCr of 0.78 mg/dL). ?Liver Function Tests: ?Recent Labs  ?Lab 07/18/21 ?1904 07/19/21 ?0500  ?AST 68* 63*  ?ALT  18 16  ?ALKPHOS 79 76  ?BILITOT 0.8 0.7  ?PROT 8.1 7.2  ?ALBUMIN 2.3* 2.1*  ? ?Recent Labs  ?Lab 07/18/21 ?1830  ?AMMONIA 17  ? ?Coagulation Profile: ?Recent Labs  ?Lab 07/18/21 ?1904  ?INR 1.1  ? ?Ca

## 2021-07-24 DIAGNOSIS — I5042 Chronic combined systolic (congestive) and diastolic (congestive) heart failure: Secondary | ICD-10-CM | POA: Diagnosis not present

## 2021-07-24 DIAGNOSIS — N179 Acute kidney failure, unspecified: Secondary | ICD-10-CM | POA: Diagnosis not present

## 2021-07-24 DIAGNOSIS — Z8673 Personal history of transient ischemic attack (TIA), and cerebral infarction without residual deficits: Secondary | ICD-10-CM | POA: Diagnosis not present

## 2021-07-24 DIAGNOSIS — I25708 Atherosclerosis of coronary artery bypass graft(s), unspecified, with other forms of angina pectoris: Secondary | ICD-10-CM | POA: Diagnosis not present

## 2021-07-24 LAB — GLUCOSE, CAPILLARY
Glucose-Capillary: 92 mg/dL (ref 70–99)
Glucose-Capillary: 117 mg/dL — ABNORMAL HIGH (ref 70–99)
Glucose-Capillary: 91 mg/dL (ref 70–99)
Glucose-Capillary: 101 mg/dL — ABNORMAL HIGH (ref 70–99)

## 2021-07-24 LAB — CBC WITH DIFFERENTIAL/PLATELET
Abs Immature Granulocytes: 0.06 10*3/uL (ref 0.00–0.07)
Basophils Absolute: 0 10*3/uL (ref 0.0–0.1)
Basophils Relative: 0 %
Eosinophils Absolute: 0.1 10*3/uL (ref 0.0–0.5)
Eosinophils Relative: 3 %
HCT: 47 % — ABNORMAL HIGH (ref 36.0–46.0)
Hemoglobin: 14.4 g/dL (ref 12.0–15.0)
Immature Granulocytes: 2 %
Lymphocytes Relative: 20 %
Lymphs Abs: 0.6 10*3/uL — ABNORMAL LOW (ref 0.7–4.0)
MCH: 29.7 pg (ref 26.0–34.0)
MCHC: 30.6 g/dL (ref 30.0–36.0)
MCV: 96.9 fL (ref 80.0–100.0)
Monocytes Absolute: 0.5 10*3/uL (ref 0.1–1.0)
Monocytes Relative: 16 %
Neutro Abs: 1.9 10*3/uL (ref 1.7–7.7)
Neutrophils Relative %: 59 %
Platelets: 118 10*3/uL — ABNORMAL LOW (ref 150–400)
RBC: 4.85 MIL/uL (ref 3.87–5.11)
RDW: 16.5 % — ABNORMAL HIGH (ref 11.5–15.5)
WBC: 3.2 10*3/uL — ABNORMAL LOW (ref 4.0–10.5)
nRBC: 0 % (ref 0.0–0.2)

## 2021-07-24 LAB — BASIC METABOLIC PANEL
Anion gap: 6 (ref 5–15)
BUN: 11 mg/dL (ref 8–23)
CO2: 23 mmol/L (ref 22–32)
Calcium: 8.7 mg/dL — ABNORMAL LOW (ref 8.9–10.3)
Chloride: 114 mmol/L — ABNORMAL HIGH (ref 98–111)
Creatinine, Ser: 0.69 mg/dL (ref 0.44–1.00)
GFR, Estimated: >60 mL/min (ref >60)
Glucose, Bld: 90 mg/dL (ref 70–99)
Potassium: 4.1 mmol/L (ref 3.5–5.1)
Sodium: 143 mmol/L (ref 135–145)

## 2021-07-24 NOTE — [place] FL2 (Signed)
?[place] MEDICAID FL2 LEVEL OF CARE SCREENING TOOL  ?  ? ?IDENTIFICATION  ?Patient Name: ?[name] [name] Birthdate: 02/19/1961 Sex: female Admission Date (Current Location): ?07/18/2021  ?[place] and [place] Number: ? [place] ?  Facility and Address:  ?[facility] [place], [place] [address] [place],  [address] ?     Provider Number: ?[id number]  ?Attending Physician Name and Address:  ?Patrecia Pour, MD ? Relative Name and Phone Number:  ?Emmery Seiler, [phone] ?   ?Current Level of Care: ?Hospital Recommended Level of Care: ?[place] Prior Approval Number: ?  ? ?Date Approved/Denied: ?  PASRR Number: ?[id number] A ? ?Discharge Plan: ?SNF ?  ? ?Current Diagnoses: ?Patient Active Problem List  ? Diagnosis Date Noted  ? Troponin I above reference range 07/18/2021  ? Chronic combined systolic and diastolic CHF (congestive heart failure) ([place]) 07/18/2021  ? AKI (acute kidney injury) ([place]) 05/15/2021  ? Sepsis ([place]) 05/14/2021  ? Multiple thyroid nodules 05/14/2021  ? AMS (altered mental status) 05/01/2021  ? Pressure injury of skin 05/01/2021  ? Malnutrition of moderate degree 05/01/2021  ? Hypokalemia   ? Urinary tract infection without hematuria   ? Lactic acidosis   ? Hypernatremia   ? Reactive depression (situational) 04/05/2021  ? Chest pain 04/06/2020  ? CAD (coronary artery disease)   ? Gastroesophageal reflux disease without esophagitis   ? Renal calculus, left 01/10/2019  ? Chest pain in adult 03/02/2017  ? Hypoxia   ? Acute on chronic diastolic CHF (congestive heart failure) ([place])   ? Hx of CABG   ? S/P angioplasty with stent; 10/04/15 to distal RCA lesion. with Promus premier. 10/08/2015  ? CAD (coronary artery disease) of artery bypass graft 10/08/2015  ? History of ST elevation myocardial infarction (STEMI) 10/04/2015  ? Superficial incisional surgical site infection, medial left lower extremity 07/10/2015  ? Hyperlipidemia with target LDL less than 70 06/13/2015   ? Chest pain with moderate risk of acute coronary syndrome   ? Tobacco abuse   ? Insulin-requiring or dependent type II diabetes mellitus ([place]) 06/04/2012  ? Lupus ([place]) 06/03/2012  ? History of stroke 06/03/2012  ? Empty sella syndrome ([place]) 06/03/2012  ? Accelerated hypertension 06/03/2012  ? Rheumatoid arthritis ([place]) 06/03/2012  ? Hidradenitis axillaris 06/03/2012  ? Obesity (BMI 30.0-34.9)   ? ? ?Orientation RESPIRATION BLADDER Height & Weight   ?  ?Self, Time, Situation, Place ? Normal Continent, External catheter Weight: 147 lb 4.3 oz (66.8 kg) ?Height:  '5\' 6"'$  (167.6 cm)  ?BEHAVIORAL SYMPTOMS/MOOD NEUROLOGICAL BOWEL NUTRITION STATUS  ?    Continent Diet (See DC summary)  ?AMBULATORY STATUS COMMUNICATION OF NEEDS Skin   ?Total Care Verbally PU Stage and Appropriate Care (PU St2 on Thigh and Coccyx) ?  ?  ?  ?    ?     ?     ? ? ?Personal Care Assistance Level of Assistance  ?Bathing, Feeding, Dressing Bathing Assistance: Maximum assistance ?Feeding assistance: Limited assistance ?Dressing Assistance: Maximum assistance ?   ? ?Functional Limitations Info  ?Sight, Hearing, Speech Sight Info: Adequate ?Hearing Info: Adequate ?Speech Info: Adequate  ? ? ?SPECIAL CARE FACTORS FREQUENCY  ?PT (By licensed PT), OT (By licensed OT)   ?  ?PT Frequency: 5x week ?OT Frequency: 5x week ?  ?  ?  ?   ? ? ?Contractures Contractures Info: Not present  ? ? ?Additional Factors Info  ?Code Status, Allergies, Insulin Sliding Scale Code Status Info: Full ?  Allergies Info: Apremelast, Latex ?  ?Insulin Sliding Scale Info: Insulin Aspart (Novolog) 0-9 U 3x daily w/ meals, 0-5 U @ bedtime ?  ?   ? ?Current Medications (07/24/2021):  This is the current hospital active medication list ?Current Facility-Administered Medications  ?Medication Dose Route Frequency Provider Last Rate Last Admin  ? acetaminophen (TYLENOL) tablet 650 mg  650 mg Oral Q6H PRN Opyd, Ilene Qua, MD   650 mg at 07/21/21 0124  ? Or  ? acetaminophen (TYLENOL)  suppository 650 mg  650 mg Rectal Q6H PRN Opyd, Ilene Qua, MD      ? amLODipine (NORVASC) tablet 2.5 mg  2.5 mg Oral Daily Opyd, Ilene Qua, MD   2.5 mg at 07/24/21 0837  ? aspirin EC tablet 81 mg  81 mg Oral Daily Opyd, Ilene Qua, MD   81 mg at 07/24/21 0998  ? atorvastatin (LIPITOR) tablet 80 mg  80 mg Oral Daily Opyd, Ilene Qua, MD   80 mg at 07/24/21 3382  ? dextrose 5 %-0.45 % sodium chloride infusion   Intravenous Continuous Patrecia Pour, MD 75 mL/hr at 07/23/21 1642 New Bag at 07/23/21 1642  ? enoxaparin (LOVENOX) injection 40 mg  40 mg Subcutaneous Q24H Opyd, Ilene Qua, MD   40 mg at 07/24/21 5053  ? escitalopram (LEXAPRO) tablet 10 mg  10 mg Oral QHS Opyd, Ilene Qua, MD   10 mg at 07/23/21 2152  ? feeding supplement (ENSURE ENLIVE / ENSURE PLUS) liquid 237 mL  237 mL Oral BID BM Opyd, Ilene Qua, MD   237 mL at 07/24/21 0836  ? fluconazole (DIFLUCAN) tablet 150 mg  150 mg Oral Daily Patrecia Pour, MD   150 mg at 07/24/21 9767  ? folic acid (FOLVITE) tablet 1 mg  1 mg Oral Daily Vance Gather B, MD   1 mg at 07/24/21 3419  ? gabapentin (NEURONTIN) capsule 100 mg  100 mg Oral BID Vianne Bulls, MD   100 mg at 07/24/21 0846  ? guaiFENesin (MUCINEX) 12 hr tablet 600 mg  600 mg Oral BID Bonnielee Haff, MD   600 mg at 07/24/21 3790  ? insulin aspart (novoLOG) injection 0-5 Units  0-5 Units Subcutaneous QHS Opyd, Ilene Qua, MD      ? insulin aspart (novoLOG) injection 0-9 Units  0-9 Units Subcutaneous TID WC Opyd, Ilene Qua, MD   2 Units at 07/23/21 1630  ? levofloxacin (LEVAQUIN) tablet 750 mg  750 mg Oral Daily Donnamae Jude, RPH   750 mg at 07/24/21 2409  ? magic mouthwash  10 mL Oral TID Bonnielee Haff, MD   10 mL at 07/24/21 0839  ? metoprolol tartrate (LOPRESSOR) tablet 25 mg  25 mg Oral BID Vianne Bulls, MD   25 mg at 07/24/21 7353  ? Muscle Rub CREA 1 application.  1 application. Topical PRN Donne Hazel, MD      ? ondansetron [facility]) tablet 4 mg  4 mg Oral Q6H PRN Opyd, Ilene Qua, MD      ? Or  ?  ondansetron (ZOFRAN) injection 4 mg  4 mg Intravenous Q6H PRN Opyd, Ilene Qua, MD      ? pantoprazole (PROTONIX) EC tablet 40 mg  40 mg Oral BID Bonnielee Haff, MD   40 mg at 07/24/21 2992  ? phenol (CHLORASEPTIC) mouth spray 1 spray  1 spray Mouth/Throat PRN Donne Hazel, MD   1 spray at 07/19/21 2318  ? saccharomyces boulardii (FLORASTOR) capsule 250  mg  250 mg Oral BID Bonnielee Haff, MD   250 mg at 07/24/21 2956  ? senna-docusate (Senokot-S) tablet 1 tablet  1 tablet Oral QHS PRN Opyd, Ilene Qua, MD      ? sodium chloride flush (NS) 0.9 % injection 3 mL  3 mL Intravenous Q12H Opyd, Ilene Qua, MD   3 mL at 07/24/21 0841  ? thiamine tablet 100 mg  100 mg Oral Daily Vance Gather B, MD   100 mg at 07/24/21 2130  ? traMADol (ULTRAM) tablet 50 mg  50 mg Oral Q8H PRN Vianne Bulls, MD   50 mg at 07/24/21 0846  ? triamcinolone cream (KENALOG) 0.1 % cream   Topical TID Patrecia Pour, MD   Given at 07/24/21 [phone]  ? ? ? ?Discharge Medications: ?Please see discharge summary for a list of discharge medications. ? ?Relevant Imaging Results: ? ?Relevant Lab Results: ? ? ?Additional Information ?SSN: [id number].  Pt is vaccinated for covid but not boosted. ? ?Coralee Pesa, LCSWA ? ? ? ? ?

## 2021-07-24 NOTE — TOC Progression Note (Signed)
Transition of Care (TOC) - Progression Note  ? ? ?Patient Details  ?Name: SHALON SALADO ?MRN: [id number] ?Date of Birth: 07/19/1960 ? ?Transition of Care (TOC) CM/SW Contact  ?Coralee Pesa, LCSWA ?Phone Number: ?07/24/2021, 4:13 PM ? ?Clinical Narrative:    ? ?CSW spoke with son, [name], who reviewed pt's bed offers, and decided to have pt return to [facility]. [facility] notified and facility acknowledged they would start the authorization. TOC will continue to follow for DC needs.  ? ?  ?  ? ?Expected Discharge Plan and Services ?  ?  ?  ?  ?  ?                ?  ?  ?  ?  ?  ?  ?  ?  ?  ?  ? ? ?Social Determinants of Health (SDOH) Interventions ?  ? ?Readmission Risk Interventions ?   ? View : No data to display.  ?  ?  ?  ? ? ?

## 2021-07-24 NOTE — Progress Notes (Signed)
?Progress Note ? ?Patient: [name] [name] VPX:[id number] DOB: [birth year]  ?DOA: 07/18/2021  DOS: 07/24/2021  ?  ?Brief hospital course: ?60 y.o. female with medical history significant for depression, anxiety, CAD, type 2 diabetes mellitus, chronic combined systolic and diastolic CHF, thyroid nodules, and history of psoriatic arthritis, presenting to the emergency department with general weakness, fatigue, aches, and loss of appetite.  Patient reports that she returned home from SNF approximately 2 weeks ago, has been working with home health PT, but has become progressively weak in general, fatigued, and has lost her appetite.  She had been ambulating with a walker but is now unable to do so, and is now also requiring assistance to get up from a chair or from her bed.  She has been experiencing aching pain in her back, shoulders, hips, legs, and chest that has insidiously worsened over the past week or 2.  She has been taking tramadol with some relief.  Denies fevers, chills, abdominal pain, or vomiting.  She had a loose stool couple days ago but none since.  She has history of thiamine deficiency but has been taking thiamine tablets ? ?Upon arrival to the ED, patient is found to be afebrile and saturating mid 90s on room air with stable blood pressure.  EKG features sinus tachycardia with PVCs.  CT chest notable for trace right pleural effusion with minimal pleural thickening but no acute airspace disease.  Also noted on CT is a punctate left upper lobe nodule.  Chemistry panel features a sodium 147, bicarbonate 19, creatinine 1.09, and AST 68.  Lactic acid is 2.4 and troponin 46.  Ammonia, ESR, and CK were normal.  Patient was given IV fluids and acetaminophen in the ED. ? ?Assessment and Plan: ?AKI; hypernatremia: Presents with fatigue, loss of appetite, and general weakness and found to have mild hypernatremia and SCr of 1.09, up from a baseline of 0.5. Serum CK normal  ?- Renal function has sustained improvement.  Hypernatremia has resolved without pharmacologic intervention.  ?- Likely prerenal in setting of loss of appetite and dehydration ? ?Fever: Isolated overnight into 5/7. WBC remains normal.  COVID-19 and influenza PCR negative. CRP elevated at 6.7.  Lactic acid 2.2.  Procalcitonin 0.14. GAS screen negative. LE venous U/S negative for DVT.  ?- Tx bronchiectasis empirically with levaquin, dose adjusted upwards due to renal function improvement.  ?- If fever recurs, could consider anaerobic coverage for dental caries with periapical lucencies on CT.  ?- Blood cultures NGTD x3 days. If remains negative, fever remains resolved, and sputum without result, would limit duration of abx to 5 days.  ?- Sputum Cx sent and pending.  ?- No deep infection on CT of the neck. Possibly candidal esophagitis based on symptom report. Continue mouthwash and added fluconazole.   ?  ?Cough: A CT chest was done at the time of admission which does not show any airspace disease.  Minimal bronchiectasis was noted.  She was prescribed Mucinex. ?- Levaquin started as above. Will complete 5 days of Tx ?  ?General weakness, debility: Presents with slowly progressive general weakness since returning home from SNF ~2 wks ago, no longer able to ambulate with her walker and now requiring assistance to get up from chair or bed  Serum CK, ammonia, and sed rate normal in ED  ?- Patient reported family has good intentions to care for pt after return from SNF, however has been logistically unable to take care for pt, resulting in pt essentially being bed bound  and not have access to food/drink. Both her sons work and are unavailable during the day hours. Suspect she would be best served/safest at SNF. CSW consulted. Pt amenable.  ?- Continue PT as much as feasible. ?  ?CAD; elevated troponin without anginal complaints, trending downward.  ?- Defer further work up at this time.  ?- Continue ASA, lipitor, and metoprolol   ?  ?Chronic combined systolic &  diastolic CHF: EF was 86% with grade 1 diastolic dysfunction on TTE in February 2023   ?- She was hypovolemic on admission possibly exacerbated poor po intake due to odynophagia. No significant volume overload noted. ?  ?Hx of CVA  ?- Continue ASA and statin  ? ?Thrombocytopenia: Unclear etiology, though had this during acute illness in February as well. No bleeding/abnormal bruising/purpura.  ?- Has stabilized. Recommend recheck at follow up.  ?  ?Lactic acidosis: Due to dehydration. Resolved with IVF.  ? ?History of thiamine deficiency, marginal folic acid levels at last check:  ?- Will restart supplementation for both at this time.  ? ?The following 2 pressure injuries:  ?Pressure Injury 07/19/21 Thigh Anterior;Proximal;Right Stage 2 -  Partial thickness loss of dermis presenting as a shallow open injury with a red, pink wound bed without slough. (Active)  ?07/19/21 2300  ?Location: Thigh  ?Location Orientation: Anterior;Proximal;Right  ?Staging: Stage 2 -  Partial thickness loss of dermis presenting as a shallow open injury with a red, pink wound bed without slough.  ?Wound Description (Comments):   ?Present on Admission: No  ?Dressing Type Foam - Lift dressing to assess site every shift 07/21/21 2000  ?   ?Pressure Injury 07/20/21 Coccyx Medial Stage 2 -  Partial thickness loss of dermis presenting as a shallow open injury with a red, pink wound bed without slough. (Active)  ?07/20/21 2300  ?Location: Coccyx  ?Location Orientation: Medial  ?Staging: Stage 2 -  Partial thickness loss of dermis presenting as a shallow open injury with a red, pink wound bed without slough.  ?Wound Description (Comments):   ?Present on Admission: No  ?Dressing Type Foam - Lift dressing to assess site every shift 07/21/21 2000  ? ?Subjective: No major changes. Still with some sore throat though able to eat, weakness is stable and mod-severe. She is amenable to getting up more often and rehabilitating. Eating and drinking normally.   ? ?Objective: ?Vitals:  ? 07/23/21 1926 07/24/21 0400 07/24/21 0800 07/24/21 1126  ?BP: 135/77 (!) 155/84 (!) 141/73 137/74  ?Pulse: 71 68  68  ?Resp: _0 ?Temp: 97.7 ?F (36.5 ?C) 98.4 ?F (36.9 ?C)  98.1 ?F (36.7 ?C)  ?TempSrc: Oral Oral  Oral  ?SpO2: 95% 97% 98% 95%  ?Weight:  66.8 kg    ?Height:      ?Gen: Frail female in no distress ?HEENT: Poor dentition without abscess. Oropharynx is clear without exudate, significant erythema, cobblestoning, or deformity. ?Pulm: Nonlabored breathing room air. Clear. ?CV: Regular rate and rhythm. No murmur, rub, or gallop. No JVD, no pitting dependent edema. ?GI: Abdomen soft, non-tender, non-distended, with normoactive bowel sounds.  ?Ext: Warm, no deformities ?Skin: No new rashes, lesions or ulcers on visualized skin. Stable psoriatic eruptions and shallow noninfected left shoulder wound. ?Neuro: Alert and oriented. Diffusely weak without focal neurological deficits. ?Psych: Judgement and insight appear fair. Mood euthymic & affect congruent. Behavior is appropriate.   ? ?Data Personally reviewed: ?CBC: ?Recent Labs  ?Lab 07/20/21 ?0149 07/21/21 ?0259 07/22/21 ?7672 07/23/21 ?0947 07/24/21 ?0962  ?  WBC 3.3* 6.3 6.0 3.7* 3.2*  ?NEUTROABS  --   --   --   --  1.9  ?HGB 12.1 13.6 13.1 11.9* 14.4  ?HCT 38.0 42.9 40.6 37.6 47.0*  ?MCV 95.5 96.2 95.1 95.9 96.9  ?PLT 155 135* 128* 112* 118*  ? ?Basic Metabolic Panel: ?Recent Labs  ?Lab 07/19/21 ?0500 07/20/21 ?0149 07/21/21 ?9021 07/22/21 ?1155 07/23/21 ?2080 07/24/21 ?2233  ?NA 148* 146* 149* 148* 147* 143  ?K 3.5 3.8 3.7 3.6 3.5 4.1  ?CL 117* 120* 121* 121* 119* 114*  ?CO2 22 22 19* _0 ?GLUCOSE 90 101* 93 93 108* 90  ?BUN _1 ?CREATININE 0.83 0.72 0.73 0.68 0.78 0.69  ?CALCIUM 9.0 8.6* 9.1 8.8* 8.6* 8.7*  ?MG 1.7  --   --   --   --   --   ?PHOS 3.2  --   --   --   --   --   ? ?GFR: ?Estimated Creatinine Clearance: 68.3 mL/min (by C-G formula based on SCr of 0.69 mg/dL). ?Liver Function Tests: ?Recent  Labs  ?Lab 07/18/21 ?1904 07/19/21 ?0500  ?AST 68* 63*  ?ALT 18 16  ?ALKPHOS 79 76  ?BILITOT 0.8 0.7  ?PROT 8.1 7.2  ?ALBUMIN 2.3* 2.1*  ? ?Recent Labs  ?Lab 07/18/21 ?1830  ?AMMONIA 17  ? ?Coagulation Pro

## 2021-07-24 NOTE — TOC Initial Note (Signed)
Transition of Care (TOC) - Initial/Assessment Note  ? ? ?Patient Details  ?Name: [name] [name] ?MRN: [id number] ?Date of Birth: [birth year] ? ?Transition of Care (TOC) CM/SW Contact:    ?Reece Agar, LCSWA ?Phone Number: ?07/24/2021, 2:11 PM ? ?Clinical Narrative:                 ?CSW received SNF consult. CSW spoke with pt son [name] via phone. CSW introduced self and explained role at the hospital. Pt reports that PTA the pt lived with family but family was not able to keep up with care for pt bc of jobs. PT reports pt needs assist with Adls. ?CSW followed up with [facility] pt previous SNF for days available. Pt has met her deductable an [facility] willing to accept pt back. Pt son wants to see if pt can get other offers. CSW will follow up with offers. Pt is not Navi managed. ? ?CSW reviewed PT/OT recommendations for SNF. Pt reports being agreeable to SNF. Pt gave CSW permission to fax out to facilities in the area. Pt has no preference of facility at this time. CSW gave pt medicare.gov rating list to review online.  ? ?CSW will continue to follow. ?  ? ?  ?  ? ? ?Patient Goals and CMS Choice ?  ?  ?  ? ?Expected Discharge Plan and Services ?  ?  ?  ?  ?  ?                ?  ?  ?  ?  ?  ?  ?  ?  ?  ?  ? ?Prior Living Arrangements/Services ?  ?  ?  ?       ?  ?  ?  ?  ? ?Activities of Daily Living ?Home Assistive Devices/Equipment: [place] Rile (specify type), Raised toilet seat with rails ?ADL Screening (condition at time of admission) ?Patient's cognitive ability adequate to safely complete daily activities?: Yes ?Is the patient deaf or have difficulty hearing?: No ?Does the patient have difficulty seeing, even when wearing glasses/contacts?: No ?Does the patient have difficulty concentrating, remembering, or making decisions?: Yes ?Patient able to express need for assistance with ADLs?: Yes ?Does the patient have difficulty dressing or bathing?: Yes ?Independently performs ADLs?: No ?Communication:  Independent ?Dressing (OT): Independent ?Grooming: Needs assistance ?Is this a change from baseline?: Pre-admission baseline ?Feeding: Independent ?Bathing: Needs assistance ?Is this a change from baseline?: Pre-admission baseline ?Toileting: Independent ?In/Out Bed: Independent ?Walks in Home: Independent with device (comment) ?Does the patient have difficulty walking or climbing stairs?: Yes ?Weakness of Legs: Both ?Weakness of Arms/Hands: Both ? ?Permission Sought/Granted ?  ?  ?   ?   ?   ?   ? ?Emotional Assessment ?  ?  ?  ?  ?  ?  ? ?Admission diagnosis:  SOB (shortness of breath) [R06.02] ?Failure to thrive in adult [R62.7] ?Troponin I above reference range [R77.8] ?AKI (acute kidney injury) ([place]) [N17.9] ?Ambulatory dysfunction [R26.2] ?Severe comorbid illness [R69] ?Chest pain, unspecified type [R07.9] ?Patient Active Problem List  ? Diagnosis Date Noted  ? Troponin I above reference range 07/18/2021  ? Chronic combined systolic and diastolic CHF (congestive heart failure) ([place]) 07/18/2021  ? AKI (acute kidney injury) ([place]) 05/15/2021  ? Sepsis ([place]) 05/14/2021  ? Multiple thyroid nodules 05/14/2021  ? AMS (altered mental status) 05/01/2021  ? Pressure injury of skin 05/01/2021  ? Malnutrition of moderate degree 05/01/2021  ? Hypokalemia   ?  Urinary tract infection without hematuria   ? Lactic acidosis   ? Hypernatremia   ? Reactive depression (situational) 04/05/2021  ? Chest pain 04/06/2020  ? CAD (coronary artery disease)   ? Gastroesophageal reflux disease without esophagitis   ? Renal calculus, left 01/10/2019  ? Chest pain in adult 03/02/2017  ? Hypoxia   ? Acute on chronic diastolic CHF (congestive heart failure) ([place])   ? Hx of CABG   ? S/P angioplasty with stent; 10/04/15 to distal RCA lesion. with Promus premier. 10/08/2015  ? CAD (coronary artery disease) of artery bypass graft 10/08/2015  ? History of ST elevation myocardial infarction (STEMI) 10/04/2015  ? Superficial incisional surgical site  infection, medial left lower extremity 07/10/2015  ? Hyperlipidemia with target LDL less than 70 06/13/2015  ? Chest pain with moderate risk of acute coronary syndrome   ? Tobacco abuse   ? Insulin-requiring or dependent type II diabetes mellitus ([place]) 06/04/2012  ? Lupus ([place]) 06/03/2012  ? History of stroke 06/03/2012  ? Empty sella syndrome ([place]) 06/03/2012  ? Accelerated hypertension 06/03/2012  ? Rheumatoid arthritis ([place]) 06/03/2012  ? Hidradenitis axillaris 06/03/2012  ? Obesity (BMI 30.0-34.9)   ? ?PCP:  Leeroy Cha, MD ?Pharmacy:   ?[id number] ?[place] ?[place] [address] ?Phone: [phone] Fax: [phone] ? ? ? ? ?Social Determinants of Health (SDOH) Interventions ?  ? ?Readmission Risk Interventions ?   ? View : No data to display.  ?  ?  ?  ? ? ? ?

## 2021-07-24 NOTE — Progress Notes (Signed)
Mobility Specialist Progress Note: ? ? 07/24/21 0945  ?Mobility  ?Activity Transferred from chair to bed;Turned to left side  ?Level of Assistance Minimal assist, patient does 75% or more  ?Activity Response Tolerated well  ?$Mobility charge 1 Mobility  ? ?Pt back to bed from chair, requiring MinA. Pt turned to left side once in bed d/t pressure wounds on buttocks per pt. Pt left with all needs met.  ? ?[place] Numbers ?Acute Rehab ?Phone: [phone] ?Office Phone: [phone] ? ?

## 2021-07-25 DIAGNOSIS — I5042 Chronic combined systolic (congestive) and diastolic (congestive) heart failure: Secondary | ICD-10-CM | POA: Diagnosis not present

## 2021-07-25 DIAGNOSIS — I25708 Atherosclerosis of coronary artery bypass graft(s), unspecified, with other forms of angina pectoris: Secondary | ICD-10-CM | POA: Diagnosis not present

## 2021-07-25 DIAGNOSIS — Z8673 Personal history of transient ischemic attack (TIA), and cerebral infarction without residual deficits: Secondary | ICD-10-CM | POA: Diagnosis not present

## 2021-07-25 DIAGNOSIS — N179 Acute kidney failure, unspecified: Secondary | ICD-10-CM | POA: Diagnosis not present

## 2021-07-25 LAB — GLUCOSE, CAPILLARY
Glucose-Capillary: 81 mg/dL (ref 70–99)
Glucose-Capillary: 97 mg/dL (ref 70–99)
Glucose-Capillary: 88 mg/dL (ref 70–99)
Glucose-Capillary: 85 mg/dL (ref 70–99)

## 2021-07-25 MED ORDER — FLUCONAZOLE 200 MG PO TABS: 200.0000 mg | ORAL_TABLET | Freq: Every day | ORAL | 0 refills | Status: DC

## 2021-07-25 MED ORDER — FOLIC ACID 1 MG PO TABS: 1.0000 mg | ORAL_TABLET | Freq: Every day | ORAL | Status: DC

## 2021-07-25 MED ORDER — ACETAMINOPHEN ER 650 MG PO TBCR: 650.0000 mg | EXTENDED_RELEASE_TABLET | Freq: Three times a day (TID) | ORAL | Status: DC | PRN

## 2021-07-25 MED ORDER — FLUCONAZOLE 150 MG PO TABS: 150.0000 mg | ORAL_TABLET | Freq: Every day | ORAL | 0 refills | Status: DC

## 2021-07-25 MED ORDER — TRIAMCINOLONE ACETONIDE 0.1 % EX CREA: TOPICAL_CREAM | Freq: Three times a day (TID) | CUTANEOUS | Status: DC | PRN

## 2021-07-25 MED ORDER — TRAMADOL HCL 50 MG PO TABS: 50.0000 mg | ORAL_TABLET | Freq: Three times a day (TID) | ORAL | 0 refills | Status: AC | PRN

## 2021-07-25 NOTE — Progress Notes (Signed)
Occupational Therapy Treatment ?Patient Details ?Name: [name] [name] ?MRN: [id number] ?DOB: 06/09/1960 ?Today's Date: 07/25/2021 ? ? ?History of present illness Pt is a 61 y.o. F who presents with generalized weakness, fatigue, and loss of appetite 07/18/2021. Pt found to have AKI, hypernamtremia, fever. Significant PMH: depression, anxiety, CAD, type 2 diabetes mellitus, chronic combined systolic and diastolic CHF, thyroid nodules, and history of psoriatic arthritis ?  ?OT comments ? Patient received in supine and agreeable to OT session with verbal cues for encouragement. Patient was min guard to get to EOB and declined getting into recliner or performing grooming tasks other than washing face. Patient performed sitting balance tasks and UE ROM while on EOB. Patient returned to supine with min assist. Patient is expected to discharge to SNF today for further OT.   ? ?Recommendations for follow up therapy are one component of a multi-disciplinary discharge planning process, led by the attending physician.  Recommendations may be updated based on patient status, additional functional criteria and insurance authorization. ?   ?Follow Up Recommendations ? Skilled nursing-short term rehab (<3 hours/day)  ?  ?Assistance Recommended at Discharge Frequent or constant Supervision/Assistance  ?Patient can return home with the following ? A lot of help with walking and/or transfers;A lot of help with bathing/dressing/bathroom;Assistance with cooking/housework;Direct supervision/assist for medications management;Direct supervision/assist for financial management;Assist for transportation;Help with stairs or ramp for entrance ?  ?Equipment Recommendations ? Wheelchair (measurements OT);Hospital bed  ?  ?Recommendations for Other Services   ? ?  ?Precautions / Restrictions Precautions ?Precautions: Fall ?Restrictions ?Weight Bearing Restrictions: No  ? ? ?  ? ?Mobility Bed Mobility ?Overal bed mobility: Needs Assistance ?Bed  Mobility: Supine to Sit, Sit to Supine ?  ?  ?Supine to sit: Min guard ?Sit to supine: Mod assist ?  ?General bed mobility comments: required assistance with BLE to return to supine ?  ? ?Transfers ?Overall transfer level: Needs assistance ?Equipment used: None ?Transfers: Sit to/from Stand ?Sit to Stand: Mod assist ?  ?  ?  ?  ?  ?General transfer comment: performed sit to stands from EOB to step towards [facility] ?  ?  ?Balance Overall balance assessment: Needs assistance ?Sitting-balance support: Bilateral upper extremity supported, Feet supported ?Sitting balance-Leahy Scale: Poor ?Sitting balance - Comments: performed AROM and grooming sitting on EOB ?  ?Standing balance support: Bilateral upper extremity supported ?Standing balance-Leahy Scale: Poor ?Standing balance comment: stood from EOB with face to face technique ?  ?  ?  ?  ?  ?  ?  ?  ?  ?  ?  ?   ? ?ADL either performed or assessed with clinical judgement  ? ?ADL Overall ADL's : Needs assistance/impaired ?  ?  ?Grooming: Dance movement psychotherapist;Set up;Sitting ?Grooming Details (indicate cue type and reason): from EOB ?  ?  ?  ?  ?  ?  ?  ?  ?  ?  ?  ?  ?  ?  ?  ?General ADL Comments: did not want to go to sink or perform other grooming tasks ?  ? ?Extremity/Trunk Assessment   ?  ?  ?  ?  ?  ? ?Vision   ?  ?  ?Perception   ?  ?Praxis   ?  ? ?Cognition Arousal/Alertness: Awake/alert ?Behavior During Therapy: Flat affect ?Overall Cognitive Status: Within Functional Limits for tasks assessed ?Area of Impairment: Following commands, Safety/judgement, Problem solving ?  ?  ?  ?  ?  ?  ?  ?  ?  ?  ?  ?  Following Commands: Follows one step commands with increased time ?Safety/Judgement: Decreased awareness of deficits ?  ?Problem Solving: Slow processing, Decreased initiation, Difficulty sequencing, Requires verbal cues, Requires tactile cues ?General Comments: pleasant and cooperative but did not want to perform OOB transfers ?  ?  ?   ?Exercises Exercises: General Upper  Extremity ?General Exercises - Upper Extremity ?Shoulder Flexion: AROM, Both, 10 reps, Seated ?Shoulder ABduction: AROM, Both, 10 reps, Seated ?Elbow Flexion: AROM, Both, 10 reps, Seated ?Elbow Extension: AROM, Both, 10 reps, Seated ? ?  ?Shoulder Instructions   ? ? ?  ?General Comments    ? ? ?Pertinent Vitals/ Pain       Pain Assessment ?Pain Assessment: Faces ?Faces Pain Scale: Hurts even more ?Pain Location: generalized but mostely in abdomen ?Pain Descriptors / Indicators: Guarding, Grimacing, Discomfort ?Pain Intervention(s): Limited activity within patient's tolerance, Monitored during session, Repositioned ? ?Home Living   ?  ?  ?  ?  ?  ?  ?  ?  ?  ?  ?  ?  ?  ?  ?  ?  ?  ?  ? ?  ?Prior Functioning/Environment    ?  ?  ?  ?   ? ?Frequency ? Min 2X/week  ? ? ? ? ?  ?Progress Toward Goals ? ?OT Goals(current goals can now be found in the care plan section) ? Progress towards OT goals: Progressing toward goals ? ?Acute Rehab OT Goals ?Patient Stated Goal: get better ?OT Goal Formulation: With patient ?Time For Goal Achievement: 08/05/21 ?Potential to Achieve Goals: Fair ?ADL Goals ?Pt Will Perform Lower Body Bathing: with min assist;sitting/lateral leans;sit to/from stand;with adaptive equipment ?Pt Will Perform Lower Body Dressing: with min assist;with adaptive equipment;sitting/lateral leans;sit to/from stand ?Pt Will Transfer to Toilet: with min assist;ambulating ?Pt/caregiver will Perform Home Exercise Program: Increased ROM;Increased strength;Both right and left upper extremity;With theraband;With minimal assist;With written HEP provided ?Additional ADL Goal #1: Patient will demonstrate increased activity tolerance to complete functional task in standing for 2-4 minutes without need for seated rest breaks. ?Additional ADL Goal #2: Patient will be able to verbalize 3 strategies to manage anxiety prior to movement and optimize participation.  ?Plan Discharge plan remains appropriate   ? ?Co-evaluation ? ? ?    ?  ?  ?  ?  ? ?  ?AM-PAC OT "6 Clicks" Daily Activity     ?Outcome Measure ? ? Help from another person eating meals?: A Little ?Help from another person taking care of personal grooming?: A Little ?Help from another person toileting, which includes using toliet, bedpan, or urinal?: A Lot ?Help from another person bathing (including washing, rinsing, drying)?: A Lot ?Help from another person to put on and taking off regular upper body clothing?: A Little ?Help from another person to put on and taking off regular lower body clothing?: A Lot ?6 Click Score: 15 ? ?  ?End of Session   ? ?OT Visit Diagnosis: Unsteadiness on feet (R26.81);Other abnormalities of gait and mobility (R26.89);Muscle weakness (generalized) (M62.81);Adult, failure to thrive (R62.7);Pain ?Pain - Right/Left: Right ?Pain - part of body: Shoulder ?  ?Activity Tolerance Patient limited by fatigue;Patient limited by pain ?  ?Patient Left in bed;with call bell/phone within reach;with bed alarm set ?  ?Nurse Communication Mobility status ?  ? ?   ? ?Time: 7517-0017 ?OT Time Calculation (min): 30 min ? ?Charges: OT General Charges ?$OT Visit: 1 Visit ?OT Treatments ?$Therapeutic Activity: 8-22 mins ?$Therapeutic Exercise: 8-22 mins ? ?  Lodema Hong, OTA ?Acute Rehabilitation Services  ?Pager [phone] ?Office [phone] ? ? ?[place] ?07/25/2021, 11:51 AM ?

## 2021-07-25 NOTE — Care Management Important Message (Signed)
Important Message ? ?Patient Details  ?Name: [name] [name] ?MRN: [id number] ?Date of Birth: Jan 10, 1961 ? ? ?Medicare Important Message Given:  Yes ? ? ? ? ?[name] ?07/25/2021, 10:10 AM ?

## 2021-07-25 NOTE — TOC Progression Note (Signed)
Transition of Care (TOC) - Progression Note  ? ? ?Patient Details  ?Name: [name] [name] ?MRN: [id number] ?Date of Birth: [birth year] ? ?Transition of Care (TOC) CM/SW Contact  ?Cecille Mcclusky Renold Don, LCSWA ?Phone Number: ?07/25/2021, 11:44 AM ? ?Clinical Narrative:    ?CSW spoke with [place] at [place], [place] still pending. CSW will continue to follow for DC planning needs.  ? ? ?  ?  ? ?Expected Discharge Plan and Services ?  ?  ?  ?  ?  ?Expected Discharge Date: 07/25/21               ?  ?  ?  ?  ?  ?  ?  ?  ?  ?  ? ? ?Social Determinants of Health (SDOH) Interventions ?  ? ?Readmission Risk Interventions ?   ? View : No data to display.  ?  ?  ?  ? ? ?

## 2021-07-25 NOTE — Discharge Summary (Addendum)
?Physician Discharge Summary ?  ?Patient: [name] [name] MRN: [id number] DOB: May 05, 1960  ?Admit date:     07/18/2021  ?Discharge date: 07/29/21 after insurance authorization obtained.  ?Discharge Physician: Patrecia Pour  ? ?PCP: Leeroy Cha, MD  ? ?Recommendations at discharge:  ?Follow up with PCP and/or SNF MD after discharge ?Recommend recheck CBC and BMP in the next week. ?Monitor odynophagia, empirically on fluconazole for candida esophagitis. If persistent consider ENT and/or GI evaluations.  ?Recommend oral surgery evaluation after discharge. ?Recommend annual/biennial ultrasound follow-up of inferior right nodule, until stability x5 years confirmed.  ? ?Discharge Diagnoses: ?Principal Problem: ?  AKI (acute kidney injury) ([place]) ?Active Problems: ?  History of stroke ?  Insulin-requiring or dependent type II diabetes mellitus ([place]) ?  CAD (coronary artery disease) ?  Lactic acidosis ?  Hypernatremia ?  Pressure injury of skin ?  Troponin I above reference range ?  Chronic combined systolic and diastolic CHF (congestive heart failure) ([place]) ? ?Hospital Course: ?[name] [name] is a 61 y.o. female with medical history significant for depression, anxiety, CAD, type 2 diabetes mellitus, chronic combined systolic and diastolic CHF, thyroid nodules, and history of psoriatic arthritis, presenting to the emergency department with general weakness, fatigue, aches, and loss of appetite.  Patient reports that she returned home from SNF approximately 2 weeks ago, has been working with home health PT, but has become progressively weak in general, fatigued, and has lost her appetite.  She had been ambulating with a walker but is now unable to do so, and is now also requiring assistance to get up from a chair or from her bed.  She has been experiencing aching pain in her back, shoulders, hips, legs, and chest that has insidiously worsened over the past week or 2.  She has been taking tramadol with some relief.   Denies fevers, chills, abdominal pain, or vomiting.  She had a loose stool couple days ago but none since.  She has history of thiamine deficiency but has been taking thiamine tablets ?  ?Upon arrival to the ED, patient is found to be afebrile and saturating mid 90s on room air with stable blood pressure.  EKG features sinus tachycardia with PVCs.  CT chest notable for trace right pleural effusion with minimal pleural thickening but no acute airspace disease.  Also noted on CT is a punctate left upper lobe nodule.  Chemistry panel features a sodium 147, bicarbonate 19, creatinine 1.09, and AST 68.  Lactic acid is 2.4 and troponin 46.  Ammonia, ESR, and CK were normal.  Patient was given IV fluids and acetaminophen in the ED. ? ?With IV fluids sodium and renal function improved. She developed a fever with cough for which 5 days of levaquin has been given with improvement and she remains afebrile. Her deconditioning will require continued SNF level rehabilitation which is pursued. Please see below for full details.  ? ?Assessment and Plan: ?AKI; hypernatremia: Presents with fatigue, loss of appetite, and general weakness and found to have mild hypernatremia and SCr of 1.09, up from a baseline of 0.5. Serum CK normal  ?- Renal function has sustained improvement, Cr down to 0.6 Hypernatremia has resolved without pharmacologic intervention.  ?- Likely prerenal in setting of loss of appetite and dehydration ?- Suggest recheck BMP in the next week ? ?Fever: Isolated overnight into 5/7. WBC remains normal.  COVID-19 and influenza PCR negative. CRP elevated at 6.7.  Lactic acid 2.2.  Procalcitonin 0.14. GAS screen negative. LE  venous U/S negative for DVT.  ?- Completed treatment for pneumonia with bronchiectasis empirically with levaquin Blood cultures Negative. No further fevers. Sputum Cx normal flora ?  ?Cough: A CT chest was done at the time of admission which does not show any airspace disease.  Minimal bronchiectasis  was noted.  She was prescribed Mucinex. ?- Levaquin started as above. Completed 5 days of Tx ?  ?Sore throat: No deep infection on CT of the neck. Patient has ongoing odynophagia, possibly candidal esophagitis based on symptom report.  ?- Continue fluconazole (initially underdosing as it was dosed based on impaired renal function, now up to $Rem'200mg'ScGU$ ).  ?- If persistent would need GI and/or ENT evaluations.   ?- No dysphagia at this time.  ? ?Thyromegaly: TSH wnl, U/S demonstrates borderline thyromegaly with small nodules. None meets criteria for biopsy. ?- Recommend annual/biennial ultrasound follow-up of inferior right nodule as above, until stability x5 years confirmed.   ? ?General weakness, debility: Presents with slowly progressive general weakness since returning home from SNF ~2 wks ago, no longer able to ambulate with her walker and now requiring assistance to get up from chair or bed  Serum CK, ammonia, and sed rate normal in ED  ?- Patient reported family has good intentions to care for pt after return from SNF, however has been logistically unable to take care for pt, resulting in pt essentially being bed bound and not have access to food/drink. Both her sons work and are unavailable during the day hours. Suspect she would be best served/safest at SNF. CSW consulted. Pt amenable.  ?- Continue PT as much as feasible. ?  ?CAD; elevated troponin without anginal complaints, trending downward.  ?- Defer further work up at this time. Follow up with cardiology. ?- Continue ASA, lipitor, and metoprolol   ?  ?Chronic combined systolic & diastolic CHF: EF was 75% with grade 1 diastolic dysfunction on TTE in February 2023   ?- She was hypovolemic on admission possibly exacerbated poor po intake due to odynophagia. No significant volume overload noted. ?  ?T2DM:  ?- CBGs well controlled with very minimal insulin HbA1c 6.7%. So continue metformin and monitor glucose intermittently. To avoid hypoglycemia, standing novolog  has been discontinued for now.  ? ?Hx of CVA  ?- Continue ASA and statin. Also on repatha (LDL is now 32) which can be continued at MD office after discharge. ?  ?Thrombocytopenia: Unclear etiology, though had this during acute illness in February as well. No bleeding/abnormal bruising/purpura.  ?- Has stabilized. Recommend recheck at follow up.  ?  ?Lactic acidosis: Due to dehydration. Resolved with IVF.  ?  ?History of thiamine deficiency, marginal folic acid levels at last check: Recommend supplementation. ? ?Chronic pain: After PDMP review/verification, prescribed 3 days of home tramadol. ? ?Psoriasis:  ?- Follow up with dermatology as outpatient. Can apply triamcinolone tropically.  ?  ?The following 2 pressure injuries:  ?    ?Pressure Injury 07/19/21 Thigh Anterior;Proximal;Right Stage 2 -  Partial thickness loss of dermis presenting as a shallow open injury with a red, pink wound bed without slough. (Active)  ?07/19/21 2300  ?Location: Thigh  ?Location Orientation: Anterior;Proximal;Right  ?Staging: Stage 2 -  Partial thickness loss of dermis presenting as a shallow open injury with a red, pink wound bed without slough.  ?Wound Description (Comments):   ?Present on Admission: No  ?Dressing Type Foam - Lift dressing to assess site every shift 07/21/21 2000  ?   ?Pressure Injury 07/20/21 Coccyx  Medial Stage 2 -  Partial thickness loss of dermis presenting as a shallow open injury with a red, pink wound bed without slough. (Active)  ?07/20/21 2300  ?Location: Coccyx  ?Location Orientation: Medial  ?Staging: Stage 2 -  Partial thickness loss of dermis presenting as a shallow open injury with a red, pink wound bed without slough.  ?Wound Description (Comments):   ?Present on Admission: No  ?Dressing Type Foam - Lift dressing to assess site every shift 07/21/21 2000  ? ?Consultants: None ?Procedures performed: None  ?Disposition: Skilled nursing facility ?Diet recommendation:  ?Cardiac diet ?DISCHARGE  MEDICATION: ?Allergies as of 07/29/2021   ? ?   Reactions  ? Apremilast Other (See Comments)  ? unknown  ? Latex Itching, Swelling, Rash  ? ?  ? ?  ?Medication List  ?  ? ?STOP taking these medications   ? ?Insulin

## 2021-07-26 ENCOUNTER — Inpatient Hospital Stay (HOSPITAL_COMMUNITY): Payer: Medicare HMO

## 2021-07-26 DIAGNOSIS — N179 Acute kidney failure, unspecified: Secondary | ICD-10-CM | POA: Diagnosis not present

## 2021-07-26 LAB — CULTURE, BLOOD (ROUTINE X 2)
Culture: NO GROWTH
Culture: NO GROWTH
Special Requests: ADEQUATE
Special Requests: ADEQUATE

## 2021-07-26 LAB — GLUCOSE, CAPILLARY
Glucose-Capillary: 88 mg/dL (ref 70–99)
Glucose-Capillary: 83 mg/dL (ref 70–99)
Glucose-Capillary: 92 mg/dL (ref 70–99)
Glucose-Capillary: 118 mg/dL — ABNORMAL HIGH (ref 70–99)

## 2021-07-26 LAB — CREATININE, SERUM
Creatinine, Ser: 0.73 mg/dL (ref 0.44–1.00)
GFR, Estimated: >60 mL/min (ref >60)

## 2021-07-26 MED ORDER — ENSURE ENLIVE PO LIQD
237.0000 mL | Freq: Three times a day (TID) | ORAL | Status: DC
  Administered 2021-07-26 – 2021-07-29 (×6): 237 mL via ORAL

## 2021-07-26 NOTE — Progress Notes (Signed)
PROGRESS NOTE ?Subjective: ?Slept well, still having some pain when swallowing. No difficulty swallowing, po intake is improved. She's eager to rehabilitate. ? ?Objective: ?BP (!) 115/58 (BP Location: Left Arm)   Pulse 73   Temp 97.8 ?F (36.6 ?C) (Oral)   Resp 20   Ht '5\' 6"'$  (1.676 m)   Wt 67.9 kg   SpO2 96%   BMI 24.16 kg/m?   ?Gen: No distress ?HEENT: No stridor, oropharynx is clear without exudate. Ears nontender to palpation without discharge.  ?Pulm: Clear and nonlabored on room air  ?CV: RRR, no murmur, no JVD, no edema ?GI: Soft, NT, ND, +BS  ?Neuro: Alert and oriented. No focal deficits. ?Skin: Stable hyperpigmented psoriatic lesions without purulence.  ? ?Assessment & Plan: ?Niaya remains stable for discharge today. Anticipate insurance authorization for discharge this morning/afternoon. Please see discharge summary from 07/25/2021 which remains accurate.  ? ?Patrecia Pour, MD ?Pager on amion ?07/26/2021, 10:00 AM   ?

## 2021-07-26 NOTE — Progress Notes (Signed)
Nutrition Follow-up ? ?DOCUMENTATION CODES:  ? ?Not applicable ? ?INTERVENTION:  ?Provide Ensure Enlive po TID, each supplement provides 350 kcal and 20 grams of protein. ? ?Encourage PO intake.  ? ?NUTRITION DIAGNOSIS:  ? ?Increased nutrient needs related to chronic illness (CHF) as evidenced by estimated needs; ongoing ? ?GOAL:  ? ?Patient will meet greater than or equal to 90% of their needs; progressing ? ?MONITOR:  ? ?PO intake, Supplement acceptance, Labs, Weight trends, Skin, I & O's ? ?REASON FOR ASSESSMENT:  ? ?Malnutrition Screening Tool ?  ? ?ASSESSMENT:  ? ?61 y.o. female with medical history significant for depression, anxiety, CAD, type 2 diabetes mellitus, chronic combined systolic and diastolic CHF, thyroid nodules, and history of psoriatic arthritis, presents with general weakness, fatigue, aches, and loss of appetite. Pt with AKI, hypernatremia. ? ?Meal completion has been 10-50%. Pt currently has ensure ordered and has been consuming them. RD to increase Ensure to TID to aid in caloric and protein needs. Plans for discharge to SNF rehabilitation.  ? ?Labs and medications reviewed.  ? ?Diet Order:   ?Diet Order   ? ?       ?  Diet Heart Room service appropriate? Yes; Fluid consistency: Thin  Diet effective now       ?  ? ?  ?  ? ?  ? ? ?EDUCATION NEEDS:  ? ?Not appropriate for education at this time ? ?Skin:  Skin Assessment: Skin Integrity Issues: ?Skin Integrity Issues:: Stage II ?Stage II: thigh, coccyx ? ?Last BM:  5/9 ? ?Height:  ? ?Ht Readings from Last 1 Encounters:  ?07/19/21 '5\' 6"'$  (1.676 m)  ? ? ?Weight:  ? ?Wt Readings from Last 1 Encounters:  ?07/26/21 67.9 kg  ? ?BMI:  Body mass index is 24.16 kg/m?. ? ?Estimated Nutritional Needs:  ? ?Kcal:  1800-2000 ? ?Protein:  85-100 grams ? ?Fluid:  >/= 1.8 L/day ? ?Corrin Parker, MS, RD, LDN ?RD pager number/after hours weekend pager number on Amion. ? ?

## 2021-07-26 NOTE — TOC Progression Note (Addendum)
Transition of Care (TOC) - Progression Note  ? ? ?Patient Details  ?Name: RAHEL CARLTON ?MRN: [id number] ?Date of Birth: May 05, 1960 ? ?Transition of Care (TOC) CM/SW Contact  ?Coralee Pesa, LCSWA ?Phone Number: ?07/26/2021, 3:11 PM ? ?Clinical Narrative:    ?3:30 [employer] told facility they will likely not get auth until Monday. Pt cannot admit over the weekend. ? ?CSW followed up with facility several times and auth has not yet been received. CSW has not heard back about a possible weekend admit, but will continue to follow up with facility. Medical team notified of barrier. TOC will continue to follow for DC needs. ? ? ?  ?  ? ?Expected Discharge Plan and Services ?  ?  ?  ?  ?  ?Expected Discharge Date: 07/25/21               ?  ?  ?  ?  ?  ?  ?  ?  ?  ?  ? ? ?Social Determinants of Health (SDOH) Interventions ?  ? ?Readmission Risk Interventions ?   ? View : No data to display.  ?  ?  ?  ? ? ?

## 2021-07-26 NOTE — Progress Notes (Signed)
Physical Therapy Treatment ?Patient Details ?Name: [name] [name] ?MRN: [id number] ?DOB: [birth year] ?Today's Date: 07/26/2021 ? ? ?History of Present Illness Pt is a 61 y.o. F who presents with generalized weakness, fatigue, and loss of appetite 07/18/2021. Pt found to have AKI, hypernamtremia, fever. Significant PMH: depression, anxiety, CAD, type 2 diabetes mellitus, chronic combined systolic and diastolic CHF, thyroid nodules, and history of psoriatic arthritis ? ?  ?PT Comments  ? ? The pt was seen for continued progression of OOB mobility, and was initially agreeable. Once therapist assisted the pt towards the EOB, she became tearful resistant to assist, and refused all further mobility due to pain in her ears and dizziness. The pt was then able to return to supine without assist, and allowed PROM of LE despite continued reports of pain with any movement in all joints of her body. Will continue to attempt to progress mobility with premedication to improve pt tolerance.  ?  ?Recommendations for follow up therapy are one component of a multi-disciplinary discharge planning process, led by the attending physician.  Recommendations may be updated based on patient status, additional functional criteria and insurance authorization. ? ?Follow Up Recommendations ? Skilled nursing-short term rehab (<3 hours/day) ?  ?  ?Assistance Recommended at Discharge Frequent or constant Supervision/Assistance  ?Patient can return home with the following A little help with walking and/or transfers;A little help with bathing/dressing/bathroom;Assistance with cooking/housework;Help with stairs or ramp for entrance;Assist for transportation ?  ?Equipment Recommendations ? Hospital bed;Wheelchair (measurements PT);Wheelchair cushion (measurements PT)  ?  ?Recommendations for Other Services   ? ? ?  ?Precautions / Restrictions Precautions ?Precautions: Fall ?Restrictions ?Weight Bearing Restrictions: No  ?  ? ?Mobility ? Bed Mobility ?Overal  bed mobility: Needs Assistance ?Bed Mobility: Rolling ?Rolling: Total assist, Supervision ?  ?  ?  ?  ?General bed mobility comments: total A to reach EOB, pt then became tearful and was able to return herself to original position in bed without assist ?  ? ?Transfers ?  ?  ?  ?  ?  ?  ?  ?  ?  ?General transfer comment: pt declined ?  ? ? ?  ?   ?Cognition Arousal/Alertness: Awake/alert ?Behavior During Therapy: Flat affect ?Overall Cognitive Status: Impaired/Different from baseline ?Area of Impairment: Problem solving, Safety/judgement ?  ?  ?  ?  ?  ?  ?  ?  ?  ?  ?  ?Following Commands: Follows one step commands inconsistently, Follows one step commands with increased time ?Safety/Judgement: Decreased awareness of deficits ?  ?Problem Solving: Slow processing, Decreased initiation, Difficulty sequencing, Requires verbal cues, Requires tactile cues ?General Comments: pt initially agreeable, but became tearful and stopped responding to questions from therapist once session started. Pt tearful and moaning with any attempted PROM. declines all mobility ?  ?  ? ?  ?Exercises General Exercises - Lower Extremity ?Ankle Circles/Pumps: PROM, Both, 10 reps, Supine ?Quad Sets: PROM, Both, 10 reps, Supine ?Heel Slides: PROM, Both, 10 reps, Supine ?Hip Flexion/Marching: PROM, Both, 10 reps, Supine ? ?  ?General Comments General comments (skin integrity, edema, etc.): pt c/o dizziness and pain in her ears limiting activity. swelling in bilateral LE ?  ?  ? ?Pertinent Vitals/Pain Pain Assessment ?Pain Assessment: Faces ?Faces Pain Scale: Hurts whole lot ?Pain Location: "whole body" ears, R knee ?Pain Descriptors / Indicators: Guarding, Grimacing, Discomfort, Crying, Moaning ?Pain Intervention(s): Limited activity within patient's tolerance, Monitored during session, Patient requesting pain meds-RN notified  ? ? ? ?  PT Goals (current goals can now be found in the care plan section) Acute Rehab PT Goals ?Patient Stated Goal: get  stronger ?PT Goal Formulation: With patient ?Time For Goal Achievement: 08/04/21 ?Potential to Achieve Goals: Good ?Progress towards PT goals: Not progressing toward goals - comment (self-limiting due to pain) ? ?  ?Frequency ? ? ? Min 3X/week ? ? ? ?  ?PT Plan Current plan remains appropriate  ? ? ?   ?AM-PAC PT "6 Clicks" Mobility   ?Outcome Measure ? Help needed turning from your back to your side while in a flat bed without using bedrails?: A Little ?Help needed moving from lying on your back to sitting on the side of a flat bed without using bedrails?: A Little ?Help needed moving to and from a bed to a chair (including a wheelchair)?: A Little ?Help needed standing up from a chair using your arms (e.g., wheelchair or bedside chair)?: Total ?Help needed to walk in hospital room?: Total ?Help needed climbing 3-5 steps with a railing? : Total ?6 Click Score: 12 ? ?  ?End of Session   ?Activity Tolerance: Patient limited by pain ?Patient left: in bed;with call bell/phone within reach ?Nurse Communication: Mobility status ?PT Visit Diagnosis: Muscle weakness (generalized) (M62.81);Pain ?Pain - part of body: Knee (ears) ?  ? ? ?Time: 6712-4580 ?PT Time Calculation (min) (ACUTE ONLY): 15 min ? ?Charges:  $Therapeutic Exercise: 8-22 mins          ?          ? ?West Carbo, PT, DPT  ? ?Acute Rehabilitation Department ?Pager #: [phone] ? ? ?[name] [name] ?07/26/2021, 4:49 PM ? ?

## 2021-07-27 LAB — GLUCOSE, CAPILLARY
Glucose-Capillary: 100 mg/dL — ABNORMAL HIGH (ref 70–99)
Glucose-Capillary: 146 mg/dL — ABNORMAL HIGH (ref 70–99)
Glucose-Capillary: 97 mg/dL (ref 70–99)
Glucose-Capillary: 195 mg/dL — ABNORMAL HIGH (ref 70–99)

## 2021-07-27 NOTE — Progress Notes (Signed)
SLP Cancellation Note ? ?Patient Details ?Name: [name] [name] ?MRN: [id number] ?DOB: 1960-04-25 ? ? ?Cancelled treatment:        order received for MBS however these cannot be done over the weekend. Discussed with Dr. Bonner Puna who wishes for her to have a MBS as an outpatient. Orders cancelled.  ? ?[name] MA, CCC-SLP ? ? ? ?[name] ?07/27/2021, 10:50 AM ?

## 2021-07-27 NOTE — Plan of Care (Signed)
?  Problem: Nutrition: ?Goal: Adequate nutrition will be maintained ?Outcome: Not Progressing ? Pt is not eating  ?

## 2021-07-27 NOTE — Progress Notes (Signed)
PROGRESS NOTE ?Subjective: ?[name] [name] has no new complaints, wants to get OOB/get off her bottom. ? ?Objective: ?BP 108/64 (BP Location: Left Arm)   Pulse 81   Temp 98.2 ?F (36.8 ?C) (Oral)   Resp (!) 22   Ht '5\' 6"'$  (1.676 m)   Wt 66.3 kg   SpO2 93%   BMI 23.59 kg/m?   ?Gen: 61 y.o. female in no distress ?HEENT: Oropharynx clear, poor dentition  ?Pulm: Nonlabored breathing room air. Clear. ?CV: Regular rate and rhythm. No murmur, rub, or gallop. No JVD, no dependent edema. ?GI: Abdomen soft, non-tender, non-distended, with normoactive bowel sounds.  ?Ext: Warm, no deformities ?Skin: No new rashes, lesions or ulcers on visualized skin. ?Neuro: Alert and oriented. No focal neurological deficits. ?Psych: Judgement and insight appear fair. Mood euthymic & affect congruent. Behavior is appropriate.   ? ?Assessment & Plan: ?Insurance authorization for SNF discharge will not be obtained until 5/15. No changes to management are currently planned. Please see discharge summary from 07/25/2021 which remains accurate.  ? ?Patrecia Pour, MD ?Pager on amion ?07/27/2021, 10:21 AM   ?

## 2021-07-28 LAB — GLUCOSE, CAPILLARY
Glucose-Capillary: 88 mg/dL (ref 70–99)
Glucose-Capillary: 114 mg/dL — ABNORMAL HIGH (ref 70–99)
Glucose-Capillary: 144 mg/dL — ABNORMAL HIGH (ref 70–99)
Glucose-Capillary: 112 mg/dL — ABNORMAL HIGH (ref 70–99)

## 2021-07-28 MED ORDER — SODIUM CHLORIDE 0.45 % IV SOLN: INTRAVENOUS | Status: AC

## 2021-07-28 NOTE — Progress Notes (Signed)
PROGRESS NOTE ?Subjective: ?Says she's not eating that much because she doesn't like the food. ? ?Objective: ?BP 130/80 (BP Location: Right Arm)   Pulse 69   Temp 98.2 ?F (36.8 ?C) (Oral)   Resp (!) 21   Ht '5\' 6"'$  (1.676 m)   Wt 68 kg   SpO2 98%   BMI 24.20 kg/m?   ?Gen: 61 y.o. female in no distress ?Pulm: Nonlabored breathing room air. Clear. ?CV: Regular rate and rhythm. No murmur, rub, or gallop. No JVD, no dependent edema. ?GI: Abdomen soft, non-tender, non-distended, with normoactive bowel sounds.  ?Ext: Warm, no deformities ?Skin: No new rashes, lesions or ulcers on visualized skin. ?Neuro: Alert and oriented. No focal neurological deficits. ?Psych: Judgement and insight appear fair. Mood euthymic & affect congruent. Behavior is appropriate.   ? ?Assessment & Plan: ?Insurance authorization for SNF discharge will not be obtained until 5/15. Please see discharge summary from 07/25/2021 which remains accurate.  ? ?Patrecia Pour, MD ?Pager on amion ?07/28/2021, 2:40 PM   ?

## 2021-07-28 NOTE — Plan of Care (Signed)
?  Problem: Clinical Measurements: Goal: Will remain free from infection Outcome: Completed/Met   Problem: Pain Managment: Goal: General experience of comfort will improve Outcome: Completed/Met   

## 2021-07-29 DIAGNOSIS — R000 Tachycardia, unspecified: Secondary | ICD-10-CM | POA: Diagnosis not present

## 2021-07-29 DIAGNOSIS — R1312 Dysphagia, oropharyngeal phase: Secondary | ICD-10-CM | POA: Diagnosis not present

## 2021-07-29 DIAGNOSIS — R531 Weakness: Secondary | ICD-10-CM | POA: Diagnosis not present

## 2021-07-29 DIAGNOSIS — Z794 Long term (current) use of insulin: Secondary | ICD-10-CM | POA: Diagnosis not present

## 2021-07-29 DIAGNOSIS — R5383 Other fatigue: Secondary | ICD-10-CM | POA: Diagnosis not present

## 2021-07-29 DIAGNOSIS — L4052 Psoriatic arthritis mutilans: Secondary | ICD-10-CM | POA: Diagnosis not present

## 2021-07-29 DIAGNOSIS — I25708 Atherosclerosis of coronary artery bypass graft(s), unspecified, with other forms of angina pectoris: Secondary | ICD-10-CM | POA: Diagnosis not present

## 2021-07-29 DIAGNOSIS — I5042 Chronic combined systolic (congestive) and diastolic (congestive) heart failure: Secondary | ICD-10-CM | POA: Diagnosis not present

## 2021-07-29 DIAGNOSIS — R404 Transient alteration of awareness: Secondary | ICD-10-CM | POA: Diagnosis not present

## 2021-07-29 DIAGNOSIS — N390 Urinary tract infection, site not specified: Secondary | ICD-10-CM | POA: Diagnosis not present

## 2021-07-29 DIAGNOSIS — Z1612 Extended spectrum beta lactamase (ESBL) resistance: Secondary | ICD-10-CM | POA: Diagnosis present

## 2021-07-29 DIAGNOSIS — E43 Unspecified severe protein-calorie malnutrition: Secondary | ICD-10-CM | POA: Diagnosis not present

## 2021-07-29 DIAGNOSIS — Z515 Encounter for palliative care: Secondary | ICD-10-CM | POA: Diagnosis not present

## 2021-07-29 DIAGNOSIS — F32A Depression, unspecified: Secondary | ICD-10-CM | POA: Diagnosis present

## 2021-07-29 DIAGNOSIS — J40 Bronchitis, not specified as acute or chronic: Secondary | ICD-10-CM | POA: Diagnosis not present

## 2021-07-29 DIAGNOSIS — I5023 Acute on chronic systolic (congestive) heart failure: Secondary | ICD-10-CM | POA: Diagnosis not present

## 2021-07-29 DIAGNOSIS — A419 Sepsis, unspecified organism: Secondary | ICD-10-CM | POA: Diagnosis not present

## 2021-07-29 DIAGNOSIS — G9340 Encephalopathy, unspecified: Secondary | ICD-10-CM | POA: Diagnosis not present

## 2021-07-29 DIAGNOSIS — E870 Hyperosmolality and hypernatremia: Secondary | ICD-10-CM | POA: Diagnosis present

## 2021-07-29 DIAGNOSIS — L89312 Pressure ulcer of right buttock, stage 2: Secondary | ICD-10-CM | POA: Diagnosis present

## 2021-07-29 DIAGNOSIS — L8990 Pressure ulcer of unspecified site, unspecified stage: Secondary | ICD-10-CM | POA: Diagnosis not present

## 2021-07-29 DIAGNOSIS — D696 Thrombocytopenia, unspecified: Secondary | ICD-10-CM | POA: Diagnosis present

## 2021-07-29 DIAGNOSIS — N179 Acute kidney failure, unspecified: Secondary | ICD-10-CM | POA: Diagnosis not present

## 2021-07-29 DIAGNOSIS — J029 Acute pharyngitis, unspecified: Secondary | ICD-10-CM | POA: Diagnosis not present

## 2021-07-29 DIAGNOSIS — R0902 Hypoxemia: Secondary | ICD-10-CM | POA: Diagnosis not present

## 2021-07-29 DIAGNOSIS — I5022 Chronic systolic (congestive) heart failure: Secondary | ICD-10-CM | POA: Diagnosis not present

## 2021-07-29 DIAGNOSIS — E119 Type 2 diabetes mellitus without complications: Secondary | ICD-10-CM | POA: Diagnosis not present

## 2021-07-29 DIAGNOSIS — R6521 Severe sepsis with septic shock: Secondary | ICD-10-CM | POA: Diagnosis not present

## 2021-07-29 DIAGNOSIS — R41841 Cognitive communication deficit: Secondary | ICD-10-CM | POA: Diagnosis not present

## 2021-07-29 DIAGNOSIS — A498 Other bacterial infections of unspecified site: Secondary | ICD-10-CM | POA: Diagnosis not present

## 2021-07-29 DIAGNOSIS — Z7189 Other specified counseling: Secondary | ICD-10-CM | POA: Diagnosis not present

## 2021-07-29 DIAGNOSIS — R627 Adult failure to thrive: Secondary | ICD-10-CM | POA: Diagnosis not present

## 2021-07-29 DIAGNOSIS — E860 Dehydration: Secondary | ICD-10-CM | POA: Diagnosis present

## 2021-07-29 DIAGNOSIS — Z20822 Contact with and (suspected) exposure to covid-19: Secondary | ICD-10-CM | POA: Diagnosis not present

## 2021-07-29 DIAGNOSIS — E872 Acidosis, unspecified: Secondary | ICD-10-CM | POA: Diagnosis not present

## 2021-07-29 DIAGNOSIS — E875 Hyperkalemia: Secondary | ICD-10-CM | POA: Diagnosis not present

## 2021-07-29 DIAGNOSIS — G9341 Metabolic encephalopathy: Secondary | ICD-10-CM | POA: Diagnosis not present

## 2021-07-29 DIAGNOSIS — E538 Deficiency of other specified B group vitamins: Secondary | ICD-10-CM | POA: Diagnosis not present

## 2021-07-29 DIAGNOSIS — M6259 Muscle wasting and atrophy, not elsewhere classified, multiple sites: Secondary | ICD-10-CM | POA: Diagnosis not present

## 2021-07-29 DIAGNOSIS — R410 Disorientation, unspecified: Secondary | ICD-10-CM | POA: Diagnosis not present

## 2021-07-29 DIAGNOSIS — R509 Fever, unspecified: Secondary | ICD-10-CM | POA: Diagnosis not present

## 2021-07-29 DIAGNOSIS — M069 Rheumatoid arthritis, unspecified: Secondary | ICD-10-CM | POA: Diagnosis present

## 2021-07-29 DIAGNOSIS — R7881 Bacteremia: Secondary | ICD-10-CM | POA: Diagnosis not present

## 2021-07-29 DIAGNOSIS — M7989 Other specified soft tissue disorders: Secondary | ICD-10-CM | POA: Diagnosis not present

## 2021-07-29 DIAGNOSIS — I700 Atherosclerosis of aorta: Secondary | ICD-10-CM | POA: Diagnosis not present

## 2021-07-29 DIAGNOSIS — E876 Hypokalemia: Secondary | ICD-10-CM | POA: Diagnosis not present

## 2021-07-29 DIAGNOSIS — R0689 Other abnormalities of breathing: Secondary | ICD-10-CM | POA: Diagnosis not present

## 2021-07-29 DIAGNOSIS — J690 Pneumonitis due to inhalation of food and vomit: Secondary | ICD-10-CM | POA: Diagnosis not present

## 2021-07-29 DIAGNOSIS — E1140 Type 2 diabetes mellitus with diabetic neuropathy, unspecified: Secondary | ICD-10-CM | POA: Diagnosis not present

## 2021-07-29 DIAGNOSIS — R456 Violent behavior: Secondary | ICD-10-CM | POA: Diagnosis not present

## 2021-07-29 DIAGNOSIS — J90 Pleural effusion, not elsewhere classified: Secondary | ICD-10-CM | POA: Diagnosis not present

## 2021-07-29 DIAGNOSIS — R1310 Dysphagia, unspecified: Secondary | ICD-10-CM | POA: Diagnosis not present

## 2021-07-29 DIAGNOSIS — R64 Cachexia: Secondary | ICD-10-CM | POA: Diagnosis present

## 2021-07-29 DIAGNOSIS — E440 Moderate protein-calorie malnutrition: Secondary | ICD-10-CM | POA: Diagnosis not present

## 2021-07-29 DIAGNOSIS — K5901 Slow transit constipation: Secondary | ICD-10-CM | POA: Diagnosis not present

## 2021-07-29 DIAGNOSIS — J9601 Acute respiratory failure with hypoxia: Secondary | ICD-10-CM | POA: Diagnosis not present

## 2021-07-29 DIAGNOSIS — M6281 Muscle weakness (generalized): Secondary | ICD-10-CM | POA: Diagnosis not present

## 2021-07-29 DIAGNOSIS — R2681 Unsteadiness on feet: Secondary | ICD-10-CM | POA: Diagnosis not present

## 2021-07-29 DIAGNOSIS — L89212 Pressure ulcer of right hip, stage 2: Secondary | ICD-10-CM | POA: Diagnosis present

## 2021-07-29 DIAGNOSIS — I110 Hypertensive heart disease with heart failure: Secondary | ICD-10-CM | POA: Diagnosis present

## 2021-07-29 DIAGNOSIS — K219 Gastro-esophageal reflux disease without esophagitis: Secondary | ICD-10-CM | POA: Diagnosis not present

## 2021-07-29 DIAGNOSIS — R4182 Altered mental status, unspecified: Secondary | ICD-10-CM | POA: Diagnosis not present

## 2021-07-29 DIAGNOSIS — R6520 Severe sepsis without septic shock: Secondary | ICD-10-CM | POA: Diagnosis present

## 2021-07-29 DIAGNOSIS — A4151 Sepsis due to Escherichia coli [E. coli]: Secondary | ICD-10-CM | POA: Diagnosis not present

## 2021-07-29 DIAGNOSIS — R278 Other lack of coordination: Secondary | ICD-10-CM | POA: Diagnosis not present

## 2021-07-29 DIAGNOSIS — I69391 Dysphagia following cerebral infarction: Secondary | ICD-10-CM | POA: Diagnosis not present

## 2021-07-29 DIAGNOSIS — I69328 Other speech and language deficits following cerebral infarction: Secondary | ICD-10-CM | POA: Diagnosis not present

## 2021-07-29 DIAGNOSIS — Z7401 Bed confinement status: Secondary | ICD-10-CM | POA: Diagnosis not present

## 2021-07-29 LAB — GLUCOSE, CAPILLARY
Glucose-Capillary: 71 mg/dL (ref 70–99)
Glucose-Capillary: 94 mg/dL (ref 70–99)

## 2021-07-29 MED ORDER — ENSURE ENLIVE PO LIQD: 237.0000 mL | Freq: Three times a day (TID) | ORAL | Status: DC

## 2021-07-29 NOTE — Plan of Care (Signed)
?  Problem: Clinical Measurements: Goal: Respiratory complications will improve Outcome: Completed/Met   

## 2021-07-29 NOTE — Care Management Important Message (Signed)
Important Message ? ?Patient Details  ?Name: [name] [name] ?MRN: [id number] ?Date of Birth: 12-25-1960 ? ? ?Medicare Important Message Given:  Yes ? ? ? ? ?[name] ?07/29/2021, 9:01 AM ?

## 2021-07-29 NOTE — Progress Notes (Signed)
Report called to Douglas at [facility] ?

## 2021-07-29 NOTE — TOC Transition Note (Signed)
Transition of Care (TOC) - CM/SW Discharge Note ? ? ?Patient Details  ?Name: [name] [name] ?MRN: [id number] ?Date of Birth: 1960-09-06 ? ?Transition of Care (TOC) CM/SW Contact:  ?[place], LCSW ?Phone Number: ?07/29/2021, 11:50 AM ? ? ?Clinical Narrative:    ? ?Patient will DC to: [facility] ?Anticipated DC date: 07/29/21 ?Family notified: Son [name] ?Transport by: Corey Harold ? ? ?Per MD patient ready for DC to [facility]. RN, patient, patient's family, and facility notified of DC. Discharge Summary and FL2 sent to facility. RN to call report prior to discharge [phone] Room 312). DC packet on chart. Ambulance transport requested for patient.  ? ?CSW will sign off for now as social work intervention is no longer needed. Please consult [place] again if new needs arise. ? ?Final next level of care: [facility] ?Barriers to Discharge: No Barriers Identified ? ? ?Patient Goals and CMS Choice ?  ?  ?  ? ?Discharge Placement ?  ?           ?Patient chooses bed at: [facility] ?Patient to be transferred to facility by: [facility] ?Name of family member notified: Son stefon ?Patient and family notified of of transfer: 07/29/21 ? ?Discharge Plan and Services ?  ?  ?           ?  ?  ?  ?  ?  ?  ?  ?  ?  ?  ? ?Social Determinants of Health (SDOH) Interventions ?  ? ? ?Readmission Risk Interventions ?   ? View : No data to display.  ?  ?  ?  ? ? ? ? ? ?

## 2021-07-30 ENCOUNTER — Encounter: Payer: Self-pay | Admitting: Adult Health

## 2021-07-30 ENCOUNTER — Non-Acute Institutional Stay (SKILLED_NURSING_FACILITY): Payer: Medicare HMO | Admitting: Adult Health

## 2021-07-30 DIAGNOSIS — K5901 Slow transit constipation: Secondary | ICD-10-CM

## 2021-07-30 DIAGNOSIS — I5042 Chronic combined systolic (congestive) and diastolic (congestive) heart failure: Secondary | ICD-10-CM

## 2021-07-30 DIAGNOSIS — N179 Acute kidney failure, unspecified: Secondary | ICD-10-CM

## 2021-07-30 DIAGNOSIS — E1140 Type 2 diabetes mellitus with diabetic neuropathy, unspecified: Secondary | ICD-10-CM

## 2021-07-30 DIAGNOSIS — J40 Bronchitis, not specified as acute or chronic: Secondary | ICD-10-CM | POA: Diagnosis not present

## 2021-07-30 DIAGNOSIS — R531 Weakness: Secondary | ICD-10-CM

## 2021-07-30 DIAGNOSIS — I25708 Atherosclerosis of coronary artery bypass graft(s), unspecified, with other forms of angina pectoris: Secondary | ICD-10-CM

## 2021-07-30 DIAGNOSIS — J029 Acute pharyngitis, unspecified: Secondary | ICD-10-CM | POA: Diagnosis not present

## 2021-07-30 NOTE — Progress Notes (Signed)
? ?Location:  [facility] ?Nursing Home Room Number: [id number]/G ?Place of Service:  SNF (31) ?Provider:  Durenda Age, DNP, FNP-BC ? ?Patient Care Team: ?Leeroy Cha, MD as PCP - General (Internal Medicine) ?Martinique, Peter M, MD as PCP - Cardiology (Cardiology) ? ?Extended Emergency Contact Information ?Primary Emergency Contact: Debold,[name] ?Address: [address] ?         [place], [place] [address] [place] ?Mobile Phone: [phone] ?Relation: Son ?Secondary Emergency Contact: Frenz,Ronald ?Mobile Phone: [phone] ?Relation: Son ? ?Code Status:  FULL ? ?Goals of care: Advanced Directive information ? ?  07/30/2021  ?  9:35 AM  ?Advanced Directives  ?Does Patient Have a Medical Advance Directive? No  ?Would patient like information on creating a medical advance directive? No - Patient declined  ? ? ? ?Chief Complaint  ?Patient presents with  ? Hospitalization Follow-up  ?  Follow up hospitalization 07/18/21 to 07/29/21  ? ? ?HPI:  ?[name] who was admitted to [facility] on 07/29/2021 post hospital admission 07/18/21 to 07/29/2021.  She has a PMH of depression, anxiety, CAD, type 2 diabetes mellitus, chronic combined systolic and diastolic CHF, thyroid nodules and Psoriatic arthritis.  She presented to the ED with generalized weakness, fatigue, aches and loss of appetite. Of note, she was discharged home from [facility] on 06/22/21 wherein she had a short-term rehabilitation. She reported having progressive weakness, fatigue and loss of appetite.  She had been ambulating with a walker but is now unable to do so and requiring assistance to get up from a chair or from her bed.  She had pains on her back, shoulders, hips, legs and chest that has been worsening for the past 2 weeks prior to admission to the hospital. ? ?CT chest notable for trace right pleural effusion with minimal pleural thickening but no acute airspace disease.  Also,  CT showed punctate left upper lobe nodule.  Chemistry panel showed sodium 147, bicarbonate 19, creatinine 1.09, and AST 68.  Lactic acid is 2.4 and troponin 46.  Ammonia, ESR and CK were normal.  Patient was given IV fluids and acetaminophen in the ED.  Sodium and renal function improved after IV fluids.  She then developed fever with cough for which 5 days of Levaquin was given.  Hospitalization was complicated by sore throat.  CT of the neck showed no deep infection.  Patient has ongoing odynophagia, possibly Candida esophagitis based on symptom report for which she was given fluconazole. ? ?She was seen in the room today. She complained of constipation and continued generalized weakness. ? ?Past Medical History:  ?Diagnosis Date  ? Acute ST elevation myocardial infarction (STEMI) involving right coronary artery ([place]) 10/04/2015  ? x 2  ? Anxiety   ? Anxiety and depression   ? CAD (coronary artery disease)   ? a. CABG 06/2015: LIMA-LAD, SVG-RCA, SVG-OM2 b. STEMI s/p PCI to distal RCA lesion with a small Promus Premier stent. ( patent LIMA to the LAD, occluded SVG to OM, occluded SVG to RCA.)  ? CAD (coronary artery disease) of artery bypass graft; occluded SVG to RCA and occluded SVG to OM 10/08/2015  ? Empty sella ([place])   ? GERD (gastroesophageal reflux disease)   ? Headache   ? from stress  ? Hematuria wed 06-13-2020  ? Hidradenitis axillaris   ? History of kidney stones   ? Hyperlipemia   ? Hypertension   ? Lupus ([place]) dx'd 2008  ? "they think  I have this; have to get retested" (06/12/2015)  ? Obesity (BMI 30.0-34.9)   ? Pneumonia X 2  ? last time yrs ago  ? PONV (postoperative nausea and vomiting)   ? likes iv med or scopolamine patch works well  ? Psoriasis 06/15/2020  ? hands elbows, ears, small amount on scalp and bridge of nose  ? Rheumatoid aortitis   ? psorriatic arthritis  ? Rheumatoid arthritis ([place])   ? S/P angioplasty with stent; 10/04/15 to distal RCA lesion. with Promus premier. 10/08/2015  ? Scoliosis    ? Sleep apnea   ? patient states "it is mild" no cpap needed  ? Stroke [facility]) March 2014  ? left MCA branches; "they say I have them all the time", denies residual on 06/12/2015  ? Tobacco abuse   ? Type II diabetes mellitus ([place])   ? Urinary frequency   ? and urgency  ? Wears glasses   ? for reading  ? ?Past Surgical History:  ?Procedure Laterality Date  ? ABDOMINAL HYSTERECTOMY  31 yrs ago  ? total has ovaies  ? BREAST BIOPSY Bilateral yrd ago  ? benign  ? CARDIAC CATHETERIZATION N/A 06/13/2015  ? Procedure: Left Heart Cath and Coronary Angiography;  Surgeon: Peter M Martinique, MD;  Location: [facility];  Service: Cardiovascular;  Laterality: N/A;  ? CARDIAC CATHETERIZATION N/A 10/04/2015  ? Procedure: Left Heart Cath and Coronary Angiography;  Surgeon: [name], MD;  Location: [facility];  Service: Cardiovascular;  Laterality: N/A;  ? CARDIAC CATHETERIZATION N/A 10/04/2015  ? Procedure: Coronary Stent Intervention;  Surgeon: [name], MD;  Location: [facility];  Service: Cardiovascular;  Laterality: N/A;  ? CORONARY ARTERY BYPASS GRAFT N/A 06/19/2015  ? Procedure: CORONARY ARTERY BYPASS GRAFTING (CABG) TIMES FOUR USING LEFT INTERNAL MAMMARY, RIGHT SAPHENOUS LEG VEIN AND CRYO SAPHENOUS VEIN;  Surgeon: [name], MD;  Location: [facility];  Service: Open Heart Surgery;  Laterality: N/A;  ? CYSTOSCOPY WITH RETROGRADE PYELOGRAM, URETEROSCOPY AND STENT PLACEMENT Left 06/21/2020  ? Procedure: CYSTOSCOPY WITH RETROGRADE PYELOGRAM, URETEROSCOPY, BASKET STONE EXTRACTION AND  STENT PLACEMENT;  Surgeon: Robley Fries, MD;  Location: [facility];  Service: Urology;  Laterality: Left;  ? DILATION AND CURETTAGE OF UTERUS  yrs ago  ? HOLMIUM LASER APPLICATION Left 0/03/930  ? Procedure: HOLMIUM LASER APPLICATION;  Surgeon: Robley Fries, MD;  Location[facility];  Service: Urology;  Laterality: Left;  ? HYDRADENITIS EXCISION Right 12/20/2019  ?  Procedure: EXCISION OF RIGHT  HIDRADENITIS AXILLARY;  Surgeon: Donnie Mesa, MD;  Location: [facility];  Service: General;  Laterality: Right;  ? IR URETERAL STENT LEFT NEW ACCESS W/O SEP NEPHROSTOMY CATH  01/10/2019  ? NEPHROLITHOTOMY Left 01/10/2019  ? Procedure: NEPHROLITHOTOMY PERCUTANEOUS;  Surgeon: Kathie Rhodes, MD;  Location: [facility];  Service: Urology;  Laterality: Left;  ? TEE WITHOUT CARDIOVERSION N/A 06/19/2015  ? Procedure: TRANSESOPHAGEAL ECHOCARDIOGRAM (TEE);  Surgeon: [name], MD;  Location: [facility];  Service: Open Heart Surgery;  Laterality: N/A;  ? TONSILLECTOMY  age 29  ? TUBAL LIGATION  many yrs ago  ? ? ?Allergies  ?Allergen Reactions  ? Apremilast Other (See Comments)  ?  unknown  ? Latex Itching, Swelling and Rash  ? ? ?Outpatient Encounter Medications as of 07/30/2021  ?Medication Sig  ? amLODipine (NORVASC) 2.5 MG tablet Take 1 tablet (2.5 mg total) by mouth daily.  ? aspirin EC 81 MG EC  tablet Take 1 tablet (81 mg total) by mouth daily.  ? atorvastatin (LIPITOR) 80 MG tablet TAKE 1 TABLET EVERY DAY  AT  6PM  ? B Complex-C (B-COMPLEX WITH VITAMIN C) tablet Place 1 tablet into feeding tube daily.  ? cholecalciferol (VITAMIN D) 25 MCG (1000 UNIT) tablet Take 1,000 Units by mouth in the morning and at bedtime.  ? escitalopram (LEXAPRO) 10 MG tablet Take 1 tablet (10 mg total) by mouth at bedtime.  ? Evolocumab with Infusor ([place]) 420 MG/3.5ML SOCT Inject 420 mg into the skin every 30 (thirty) days.  ? feeding supplement (ENSURE ENLIVE / ENSURE PLUS) LIQD Take 237 mLs by mouth 3 (three) times daily between meals.  ? fluconazole (DIFLUCAN) 200 MG tablet Take 1 tablet (200 mg total) by mouth daily for 10 days.  ? folic acid (FOLVITE) 1 MG tablet Take 1 tablet (1 mg total) by mouth daily.  ? gabapentin (NEURONTIN) 100 MG capsule Take 1 capsule (100 mg total) by mouth 2 (two) times daily.  ? guaiFENesin (MUCINEX) 600 MG 12 hr tablet Take 1 tablet (600 mg total) by mouth 2 (two)  times daily as needed (congestion.).  ? metFORMIN (GLUCOPHAGE) 500 MG tablet Take 1 tablet (500 mg total) by mouth 2 (two) times daily.  ? nitroGLYCERIN (NITROSTAT) 0.4 MG SL tablet DISSOLVE 1 TABLET UNDER THE TO

## 2021-08-01 ENCOUNTER — Encounter: Payer: Self-pay | Admitting: Internal Medicine

## 2021-08-01 ENCOUNTER — Non-Acute Institutional Stay (SKILLED_NURSING_FACILITY): Payer: Medicare HMO | Admitting: Internal Medicine

## 2021-08-01 DIAGNOSIS — I700 Atherosclerosis of aorta: Secondary | ICD-10-CM | POA: Diagnosis not present

## 2021-08-01 DIAGNOSIS — R1312 Dysphagia, oropharyngeal phase: Secondary | ICD-10-CM | POA: Diagnosis not present

## 2021-08-01 DIAGNOSIS — E440 Moderate protein-calorie malnutrition: Secondary | ICD-10-CM | POA: Diagnosis not present

## 2021-08-01 DIAGNOSIS — R1310 Dysphagia, unspecified: Secondary | ICD-10-CM | POA: Insufficient documentation

## 2021-08-01 DIAGNOSIS — E119 Type 2 diabetes mellitus without complications: Secondary | ICD-10-CM | POA: Diagnosis not present

## 2021-08-01 DIAGNOSIS — N179 Acute kidney failure, unspecified: Secondary | ICD-10-CM

## 2021-08-01 DIAGNOSIS — Z794 Long term (current) use of insulin: Secondary | ICD-10-CM

## 2021-08-01 DIAGNOSIS — R627 Adult failure to thrive: Secondary | ICD-10-CM | POA: Diagnosis not present

## 2021-08-01 NOTE — Assessment & Plan Note (Signed)
SLP to consult at SNF. Rheumatology follow-up with Dr. Trudie Reed is overdue.  Dr.Hawkes will receive a copy of this note.

## 2021-08-01 NOTE — Progress Notes (Signed)
NURSING HOME LOCATION:  [facility] ROOM NUMBER:  [id number]  CODE STATUS:  Full Code  PCP:  Leeroy Cha MD  This is a comprehensive admission note to this SNFperformed on this date less than 30 days from date of admission. Included are preadmission medical/surgical history; reconciled medication list; family history; social history and comprehensive review of systems.  Corrections and additions to the records were documented. Comprehensive physical exam was also performed. Additionally a clinical summary was entered for each active diagnosis pertinent to this admission in the Problem List to enhance continuity of care.  HPI: Patient was hospitalized 5/4 - 07/29/2021 after presenting to the ED with generalized weakness, fatigue, musculoskeletal pain and anorexia.  The patient had been at home with PT 2 weeks after being discharged from SNF; but she became progressively weaker with the above symptoms.  She had been ambulating with assistance but was no longer able to do so.  She also required assistance to get  up from a chair or bed.  Musculoskeletal pain is diffuse involving the back, shoulders, hips, legs, and chest with progression over the 1-2 weeks PTA. CT of the chest revealed trace right pleural effusion with minimal pleural thickening and minimal bronchiectasis.. An incidental finding was a punctate  2 mm LUL nodule.   Serum creatinine was 1.09 and GFR 57 indicating CKD stage IIIa. AST was 68.  Lactic acid level was 2.4 and troponin 46.  Surprisingly ESR was normal (10) as well as CK and ammonia level.  C-reactive protein was elevated at 6.7 with normals less than 1. Course was complicated by fever for which she received 5 days of Levaquin for clinical bronchiectasis.  Infectious screening including COVID-19 was negative.  Albumin was significantly reduced to 2.3, but total protein was high normal at 8.1. AKI resolved with fluids and creatinine was 0.73 w GFR >  60 prior to discharge indicating CKD stage II.  Hyponatremia also resolved without pharmacologic intervention.  This was attributed to the anorexia and dehydration. Glucoses while hospitalized ranged from a low of 71 up to high of 195.  The latter was an outlier.  Most current A1c was 6.7% on 04/30/2021, indicating excellent control. Fluconazole was ordered for symptomatic sore throat in the context of pre-existing odynophagia.  Fluconazole dose was adjusted based on renal function. At admission CBC and differential was normal.  She did exhibit some leukopenia during the hospitalization.  Prior to discharge platelet # was 112,000 and hemoglobin was mildly reduced at 11.9. Despite clinical improvement SNF placement for rehab was recommended with PT/OT.  Her discharge from the hospital originally was 5/11 but could not be completed until 5/15 because of lack of insurance authorization for SNF placement.  Past medical and surgical history: Includes CAD with history of MI, history of empty sella syndrome, GERD, history of nephrolithiasis, dyslipidemia, essential hypertension, history of lupus, rheumatoid arthritis with aortitis, hidradenitis, OSA, history of stroke, history of anxiety/depression, and diabetes with neurovascular complications. Surgeries and procedures include breast biopsy, CABG, hysterectomy, nephrolithotomy, and hidradenitis excision.  Social history: Nondrinker; former smoker with an 18-pack-year history.  Family history: Extensive history reviewed.   Review of systems: States that her progressive weakness and debility was in the context of poor oral intake.  She describes vague discomfort in both sides of the posterior oropharynx which resulted in dysphagia.  She also states she gags even drinking water at times.   She sees a Rheumatologist who questions whether she might have lupus.  She does have dry eyes and dry mouth. She also describes some paroxysmal nocturnal dyspnea symptoms in  the context of evaluation a year ago for sleep apnea.  She states she did not qualify for CPAP.  She does validate snoring. She also describes chest pain in the left inframammary area as well as associated back pain which is worse moving around or with position change.  If she sleeps with pillows in an erect position; these pains are mediated. She has numbness in her ankles and feet. She describes occasional constipation.  She has oliguria.  Constitutional: No fever  Eyes: No redness, discharge, pain, vision change ENT/mouth: No nasal congestion, purulent discharge, earache, change in hearing  Cardiovascular: No palpitations, claudication, edema  Respiratory: No cough, sputum production, hemoptysis, DOE Gastrointestinal: No heartburn,  abdominal pain, nausea /vomiting, rectal bleeding, melena Genitourinary: No dysuria, hematuria, pyuria, incontinence, nocturia Dermatologic: No rash, pruritus, change in appearance of skin Neurologic: No dizziness, headache, syncope, seizures, numbness, tingling Psychiatric: No significant anxiety, depression, insomnia Endocrine: No change in hair/skin/nails, excessive thirst, excessive hunger, excessive urination  Hematologic/lymphatic: No significant bruising, lymphadenopathy, abnormal bleeding Allergy/immunology: No itchy/watery eyes, significant sneezing, urticaria, angioedema  Physical exam:  Pertinent or positive findings: She appears somewhat chronically ill.  She exhibits a somewhat halting speech pattern.  Hair is disheveled.  There is slight ptosis on the right compared to the left.  Density of eyebrows decreased laterally.  Teeth are malaligned.  She has splotchy hyperpigmentation over her face.  This is present at the corner of the mouth bilaterally, left greater than right.  Is also present over the lower lids and above the medial eyebrows and across the bridge of the nose. Second heart sound is increased.  She has bibasilar rales, greater on the  left.  Posterior tibial pulses are stronger than dorsalis pedis pulses.  She is weak to opposition in all limbs but more so in the lower extremities than in the upper extremities.  Slight clubbing of the nailbeds is suggested  General appearance:  no acute distress, increased work of breathing is present.   Lymphatic: No lymphadenopathy about the head, neck, axilla. Eyes: No conjunctival inflammation or lid edema is present. There is no scleral icterus. Ears:  External ear exam shows no significant lesions or deformities.   Nose:  External nasal examination shows no deformity or inflammation. Nasal mucosa are pink and moist without lesions, exudates Oral exam: Lips and gums are healthy appearing.There is no oropharyngeal erythema or exudate. Neck:  No thyromegaly, masses, tenderness noted.    Heart:  Normal rate and regular rhythm. S1 normal without gallop, murmur, click, rub.  Lungs: without wheezes, rhonchi, rubs. Abdomen: Bowel sounds are normal.  Abdomen is soft and nontender with no organomegaly, hernias, masses. GU: Deferred  Extremities:  No cyanosis, clubbing, edema. Neurologic exam:  Balance, Rhomberg, finger to nose testing could not be completed due to clinical state Skin: Warm & dry w/o tenting. No significant rash.  See clinical summary under each active problem in the Problem List with associated updated therapeutic plan

## 2021-08-01 NOTE — Assessment & Plan Note (Signed)
Glucoses while hospitalized ranged from a low of 71 up to a high of 195.  Current A1c indicates excellent control with a value of 6.7%.  No change indicated

## 2021-08-01 NOTE — Assessment & Plan Note (Signed)
5/4 - 07/29/2021 presenting with CKD stage IIIa with creatinine 1.09 and GFR 57 in the context of progressive weakness, generalized pain, and anorexia. With intervention creatinine 0.73 with GFR greater than 60 prior to discharge indicating CKD stage II.  Med list reviewed; no change in meds or dosage indicated.

## 2021-08-01 NOTE — Patient Instructions (Signed)
See assessment and plan under each diagnosis in the problem list and acutely for this visit 

## 2021-08-01 NOTE — Assessment & Plan Note (Signed)
PT/OT at SNF as tolerated. ?

## 2021-08-01 NOTE — Assessment & Plan Note (Addendum)
Albumin improved to 2.3; total protein normal at 8.1 despite the history of anorexia PTA.  Nutrition & SLP consults at SNF.

## 2021-08-02 DIAGNOSIS — I700 Atherosclerosis of aorta: Secondary | ICD-10-CM | POA: Insufficient documentation

## 2021-08-02 NOTE — Assessment & Plan Note (Signed)
Her chest pain is atypical , mainly positional, despite PMH of CAD. EKG 07/18/21 reviewed : old inferolateral MI w persistent T inversions. Repeat EKG & troponin if chest pain persists or progresses.

## 2021-08-05 ENCOUNTER — Encounter (HOSPITAL_COMMUNITY): Payer: Self-pay

## 2021-08-05 ENCOUNTER — Emergency Department (HOSPITAL_COMMUNITY): Payer: Medicare HMO

## 2021-08-05 ENCOUNTER — Other Ambulatory Visit: Payer: Self-pay

## 2021-08-05 ENCOUNTER — Inpatient Hospital Stay (HOSPITAL_COMMUNITY)
Admission: EM | Admit: 2021-08-05 | Discharge: 2021-08-26 | DRG: 871 | Disposition: A | Discharge status: Skilled Nursing Facility | Payer: Medicare HMO | Source: Skilled Nursing Facility | Attending: Internal Medicine | Admitting: Internal Medicine

## 2021-08-05 DIAGNOSIS — I5022 Chronic systolic (congestive) heart failure: Secondary | ICD-10-CM | POA: Diagnosis present

## 2021-08-05 DIAGNOSIS — E538 Deficiency of other specified B group vitamins: Secondary | ICD-10-CM | POA: Diagnosis present

## 2021-08-05 DIAGNOSIS — L4050 Arthropathic psoriasis, unspecified: Secondary | ICD-10-CM | POA: Diagnosis present

## 2021-08-05 DIAGNOSIS — J690 Pneumonitis due to inhalation of food and vomit: Secondary | ICD-10-CM | POA: Diagnosis not present

## 2021-08-05 DIAGNOSIS — G9340 Encephalopathy, unspecified: Secondary | ICD-10-CM

## 2021-08-05 DIAGNOSIS — Z20822 Contact with and (suspected) exposure to covid-19: Secondary | ICD-10-CM | POA: Diagnosis not present

## 2021-08-05 DIAGNOSIS — R531 Weakness: Secondary | ICD-10-CM | POA: Diagnosis not present

## 2021-08-05 DIAGNOSIS — E875 Hyperkalemia: Secondary | ICD-10-CM | POA: Diagnosis not present

## 2021-08-05 DIAGNOSIS — Z1612 Extended spectrum beta lactamase (ESBL) resistance: Secondary | ICD-10-CM | POA: Diagnosis present

## 2021-08-05 DIAGNOSIS — R1310 Dysphagia, unspecified: Secondary | ICD-10-CM | POA: Diagnosis not present

## 2021-08-05 DIAGNOSIS — Z79899 Other long term (current) drug therapy: Secondary | ICD-10-CM

## 2021-08-05 DIAGNOSIS — E785 Hyperlipidemia, unspecified: Secondary | ICD-10-CM | POA: Diagnosis present

## 2021-08-05 DIAGNOSIS — L249 Irritant contact dermatitis, unspecified cause: Secondary | ICD-10-CM | POA: Diagnosis not present

## 2021-08-05 DIAGNOSIS — L89312 Pressure ulcer of right buttock, stage 2: Secondary | ICD-10-CM | POA: Diagnosis present

## 2021-08-05 DIAGNOSIS — E872 Acidosis, unspecified: Secondary | ICD-10-CM | POA: Diagnosis not present

## 2021-08-05 DIAGNOSIS — K219 Gastro-esophageal reflux disease without esophagitis: Secondary | ICD-10-CM | POA: Diagnosis not present

## 2021-08-05 DIAGNOSIS — M6259 Muscle wasting and atrophy, not elsewhere classified, multiple sites: Secondary | ICD-10-CM | POA: Diagnosis not present

## 2021-08-05 DIAGNOSIS — N179 Acute kidney failure, unspecified: Secondary | ICD-10-CM | POA: Diagnosis present

## 2021-08-05 DIAGNOSIS — J90 Pleural effusion, not elsewhere classified: Secondary | ICD-10-CM | POA: Diagnosis not present

## 2021-08-05 DIAGNOSIS — R079 Chest pain, unspecified: Secondary | ICD-10-CM | POA: Diagnosis not present

## 2021-08-05 DIAGNOSIS — Z823 Family history of stroke: Secondary | ICD-10-CM

## 2021-08-05 DIAGNOSIS — E870 Hyperosmolality and hypernatremia: Secondary | ICD-10-CM | POA: Diagnosis not present

## 2021-08-05 DIAGNOSIS — D696 Thrombocytopenia, unspecified: Secondary | ICD-10-CM | POA: Diagnosis present

## 2021-08-05 DIAGNOSIS — I110 Hypertensive heart disease with heart failure: Secondary | ICD-10-CM | POA: Diagnosis present

## 2021-08-05 DIAGNOSIS — L8990 Pressure ulcer of unspecified site, unspecified stage: Secondary | ICD-10-CM | POA: Diagnosis not present

## 2021-08-05 DIAGNOSIS — R404 Transient alteration of awareness: Secondary | ICD-10-CM | POA: Diagnosis not present

## 2021-08-05 DIAGNOSIS — A4151 Sepsis due to Escherichia coli [E. coli]: Secondary | ICD-10-CM | POA: Diagnosis not present

## 2021-08-05 DIAGNOSIS — L89212 Pressure ulcer of right hip, stage 2: Secondary | ICD-10-CM | POA: Diagnosis present

## 2021-08-05 DIAGNOSIS — J9601 Acute respiratory failure with hypoxia: Secondary | ICD-10-CM | POA: Diagnosis present

## 2021-08-05 DIAGNOSIS — N390 Urinary tract infection, site not specified: Secondary | ICD-10-CM | POA: Diagnosis present

## 2021-08-05 DIAGNOSIS — R000 Tachycardia, unspecified: Secondary | ICD-10-CM | POA: Diagnosis not present

## 2021-08-05 DIAGNOSIS — R6520 Severe sepsis without septic shock: Secondary | ICD-10-CM | POA: Diagnosis not present

## 2021-08-05 DIAGNOSIS — Z8249 Family history of ischemic heart disease and other diseases of the circulatory system: Secondary | ICD-10-CM

## 2021-08-05 DIAGNOSIS — Z48813 Encounter for surgical aftercare following surgery on the respiratory system: Secondary | ICD-10-CM | POA: Diagnosis not present

## 2021-08-05 DIAGNOSIS — Z888 Allergy status to other drugs, medicaments and biological substances status: Secondary | ICD-10-CM

## 2021-08-05 DIAGNOSIS — A498 Other bacterial infections of unspecified site: Secondary | ICD-10-CM

## 2021-08-05 DIAGNOSIS — Z7401 Bed confinement status: Secondary | ICD-10-CM | POA: Diagnosis not present

## 2021-08-05 DIAGNOSIS — L4052 Psoriatic arthritis mutilans: Secondary | ICD-10-CM | POA: Diagnosis not present

## 2021-08-05 DIAGNOSIS — R933 Abnormal findings on diagnostic imaging of other parts of digestive tract: Secondary | ICD-10-CM | POA: Diagnosis not present

## 2021-08-05 DIAGNOSIS — R627 Adult failure to thrive: Secondary | ICD-10-CM | POA: Diagnosis not present

## 2021-08-05 DIAGNOSIS — F32A Depression, unspecified: Secondary | ICD-10-CM | POA: Diagnosis present

## 2021-08-05 DIAGNOSIS — Z87891 Personal history of nicotine dependence: Secondary | ICD-10-CM

## 2021-08-05 DIAGNOSIS — R1312 Dysphagia, oropharyngeal phase: Secondary | ICD-10-CM | POA: Diagnosis not present

## 2021-08-05 DIAGNOSIS — R456 Violent behavior: Secondary | ICD-10-CM | POA: Diagnosis not present

## 2021-08-05 DIAGNOSIS — M419 Scoliosis, unspecified: Secondary | ICD-10-CM | POA: Diagnosis present

## 2021-08-05 DIAGNOSIS — J189 Pneumonia, unspecified organism: Secondary | ICD-10-CM | POA: Diagnosis not present

## 2021-08-05 DIAGNOSIS — Z833 Family history of diabetes mellitus: Secondary | ICD-10-CM

## 2021-08-05 DIAGNOSIS — B9620 Unspecified Escherichia coli [E. coli] as the cause of diseases classified elsewhere: Secondary | ICD-10-CM | POA: Diagnosis not present

## 2021-08-05 DIAGNOSIS — J9811 Atelectasis: Secondary | ICD-10-CM | POA: Diagnosis not present

## 2021-08-05 DIAGNOSIS — Z951 Presence of aortocoronary bypass graft: Secondary | ICD-10-CM | POA: Diagnosis not present

## 2021-08-05 DIAGNOSIS — E43 Unspecified severe protein-calorie malnutrition: Secondary | ICD-10-CM | POA: Diagnosis present

## 2021-08-05 DIAGNOSIS — Z515 Encounter for palliative care: Secondary | ICD-10-CM

## 2021-08-05 DIAGNOSIS — I2510 Atherosclerotic heart disease of native coronary artery without angina pectoris: Secondary | ICD-10-CM | POA: Diagnosis not present

## 2021-08-05 DIAGNOSIS — M7989 Other specified soft tissue disorders: Secondary | ICD-10-CM | POA: Diagnosis not present

## 2021-08-05 DIAGNOSIS — R0902 Hypoxemia: Secondary | ICD-10-CM | POA: Diagnosis not present

## 2021-08-05 DIAGNOSIS — K224 Dyskinesia of esophagus: Secondary | ICD-10-CM | POA: Diagnosis present

## 2021-08-05 DIAGNOSIS — E119 Type 2 diabetes mellitus without complications: Secondary | ICD-10-CM | POA: Diagnosis present

## 2021-08-05 DIAGNOSIS — R41841 Cognitive communication deficit: Secondary | ICD-10-CM | POA: Diagnosis not present

## 2021-08-05 DIAGNOSIS — I5023 Acute on chronic systolic (congestive) heart failure: Secondary | ICD-10-CM | POA: Diagnosis present

## 2021-08-05 DIAGNOSIS — R509 Fever, unspecified: Secondary | ICD-10-CM | POA: Diagnosis not present

## 2021-08-05 DIAGNOSIS — E860 Dehydration: Secondary | ICD-10-CM | POA: Diagnosis present

## 2021-08-05 DIAGNOSIS — E876 Hypokalemia: Secondary | ICD-10-CM | POA: Diagnosis not present

## 2021-08-05 DIAGNOSIS — R091 Pleurisy: Secondary | ICD-10-CM | POA: Diagnosis not present

## 2021-08-05 DIAGNOSIS — R2681 Unsteadiness on feet: Secondary | ICD-10-CM | POA: Diagnosis not present

## 2021-08-05 DIAGNOSIS — G9341 Metabolic encephalopathy: Secondary | ICD-10-CM | POA: Diagnosis not present

## 2021-08-05 DIAGNOSIS — E440 Moderate protein-calorie malnutrition: Secondary | ICD-10-CM | POA: Diagnosis present

## 2021-08-05 DIAGNOSIS — G4730 Sleep apnea, unspecified: Secondary | ICD-10-CM | POA: Diagnosis present

## 2021-08-05 DIAGNOSIS — A419 Sepsis, unspecified organism: Secondary | ICD-10-CM | POA: Diagnosis present

## 2021-08-05 DIAGNOSIS — I700 Atherosclerosis of aorta: Secondary | ICD-10-CM | POA: Diagnosis not present

## 2021-08-05 DIAGNOSIS — I69391 Dysphagia following cerebral infarction: Secondary | ICD-10-CM | POA: Diagnosis not present

## 2021-08-05 DIAGNOSIS — I69328 Other speech and language deficits following cerebral infarction: Secondary | ICD-10-CM | POA: Diagnosis not present

## 2021-08-05 DIAGNOSIS — I509 Heart failure, unspecified: Secondary | ICD-10-CM | POA: Diagnosis not present

## 2021-08-05 DIAGNOSIS — Z7189 Other specified counseling: Secondary | ICD-10-CM | POA: Diagnosis not present

## 2021-08-05 DIAGNOSIS — R410 Disorientation, unspecified: Secondary | ICD-10-CM | POA: Diagnosis not present

## 2021-08-05 DIAGNOSIS — R0602 Shortness of breath: Secondary | ICD-10-CM | POA: Diagnosis not present

## 2021-08-05 DIAGNOSIS — R64 Cachexia: Secondary | ICD-10-CM | POA: Diagnosis present

## 2021-08-05 DIAGNOSIS — R6521 Severe sepsis with septic shock: Secondary | ICD-10-CM

## 2021-08-05 DIAGNOSIS — Z4682 Encounter for fitting and adjustment of non-vascular catheter: Secondary | ICD-10-CM | POA: Diagnosis not present

## 2021-08-05 DIAGNOSIS — F419 Anxiety disorder, unspecified: Secondary | ICD-10-CM | POA: Diagnosis present

## 2021-08-05 DIAGNOSIS — Z7982 Long term (current) use of aspirin: Secondary | ICD-10-CM

## 2021-08-05 DIAGNOSIS — Z91040 Latex allergy status: Secondary | ICD-10-CM

## 2021-08-05 DIAGNOSIS — R4182 Altered mental status, unspecified: Secondary | ICD-10-CM | POA: Diagnosis not present

## 2021-08-05 DIAGNOSIS — I252 Old myocardial infarction: Secondary | ICD-10-CM

## 2021-08-05 DIAGNOSIS — Z8673 Personal history of transient ischemic attack (TIA), and cerebral infarction without residual deficits: Secondary | ICD-10-CM

## 2021-08-05 DIAGNOSIS — Z6827 Body mass index (BMI) 27.0-27.9, adult: Secondary | ICD-10-CM

## 2021-08-05 DIAGNOSIS — Z741 Need for assistance with personal care: Secondary | ICD-10-CM | POA: Diagnosis not present

## 2021-08-05 DIAGNOSIS — M6281 Muscle weakness (generalized): Secondary | ICD-10-CM | POA: Diagnosis not present

## 2021-08-05 DIAGNOSIS — Z820 Family history of epilepsy and other diseases of the nervous system: Secondary | ICD-10-CM

## 2021-08-05 DIAGNOSIS — M069 Rheumatoid arthritis, unspecified: Secondary | ICD-10-CM | POA: Diagnosis present

## 2021-08-05 DIAGNOSIS — Z7984 Long term (current) use of oral hypoglycemic drugs: Secondary | ICD-10-CM

## 2021-08-05 DIAGNOSIS — R7881 Bacteremia: Secondary | ICD-10-CM | POA: Diagnosis not present

## 2021-08-05 DIAGNOSIS — R0689 Other abnormalities of breathing: Secondary | ICD-10-CM | POA: Diagnosis not present

## 2021-08-05 LAB — COMPREHENSIVE METABOLIC PANEL
ALT: 21 U/L (ref 0–44)
AST: 75 U/L — ABNORMAL HIGH (ref 15–41)
Albumin: 1.5 g/dL — ABNORMAL LOW (ref 3.5–5.0)
Alkaline Phosphatase: 223 U/L — ABNORMAL HIGH (ref 38–126)
Anion gap: 9 (ref 5–15)
BUN: 31 mg/dL — ABNORMAL HIGH (ref 8–23)
CO2: 23 mmol/L (ref 22–32)
Calcium: 8.9 mg/dL (ref 8.9–10.3)
Chloride: 117 mmol/L — ABNORMAL HIGH (ref 98–111)
Creatinine, Ser: 1.54 mg/dL — ABNORMAL HIGH (ref 0.44–1.00)
GFR, Estimated: 38 mL/min — ABNORMAL LOW (ref >60)
Glucose, Bld: 129 mg/dL — ABNORMAL HIGH (ref 70–99)
Potassium: 5.2 mmol/L — ABNORMAL HIGH (ref 3.5–5.1)
Sodium: 149 mmol/L — ABNORMAL HIGH (ref 135–145)
Total Bilirubin: 1.2 mg/dL (ref 0.3–1.2)
Total Protein: 7.1 g/dL (ref 6.5–8.1)

## 2021-08-05 LAB — I-STAT ARTERIAL BLOOD GAS, ED
Acid-Base Excess: 2 mmol/L (ref 0.0–2.0)
Bicarbonate: 27.4 mmol/L (ref 20.0–28.0)
Calcium, Ion: 1.26 mmol/L (ref 1.15–1.40)
HCT: 34 % — ABNORMAL LOW (ref 36.0–46.0)
Hemoglobin: 11.6 g/dL — ABNORMAL LOW (ref 12.0–15.0)
O2 Saturation: 93 %
Patient temperature: 101.4
Potassium: 3.9 mmol/L (ref 3.5–5.1)
Sodium: 151 mmol/L — ABNORMAL HIGH (ref 135–145)
TCO2: 29 mmol/L (ref 22–32)
pCO2 arterial: 45.9 mmHg (ref 32–48)
pH, Arterial: 7.391 (ref 7.35–7.45)
pO2, Arterial: 73 mmHg — ABNORMAL LOW (ref 83–108)

## 2021-08-05 LAB — CBC WITH DIFFERENTIAL/PLATELET
Abs Immature Granulocytes: 0.11 10*3/uL — ABNORMAL HIGH (ref 0.00–0.07)
Basophils Absolute: 0 10*3/uL (ref 0.0–0.1)
Basophils Relative: 0 %
Eosinophils Absolute: 0 10*3/uL (ref 0.0–0.5)
Eosinophils Relative: 0 %
HCT: 44.4 % (ref 36.0–46.0)
Hemoglobin: 13.7 g/dL (ref 12.0–15.0)
Immature Granulocytes: 1 %
Lymphocytes Relative: 6 %
Lymphs Abs: 0.7 10*3/uL (ref 0.7–4.0)
MCH: 29.5 pg (ref 26.0–34.0)
MCHC: 30.9 g/dL (ref 30.0–36.0)
MCV: 95.5 fL (ref 80.0–100.0)
Monocytes Absolute: 0.5 10*3/uL (ref 0.1–1.0)
Monocytes Relative: 4 %
Neutro Abs: 10.9 10*3/uL — ABNORMAL HIGH (ref 1.7–7.7)
Neutrophils Relative %: 89 %
Platelets: 179 10*3/uL (ref 150–400)
RBC: 4.65 MIL/uL (ref 3.87–5.11)
RDW: 16.1 % — ABNORMAL HIGH (ref 11.5–15.5)
WBC: 12.2 10*3/uL — ABNORMAL HIGH (ref 4.0–10.5)
nRBC: 0 % (ref 0.0–0.2)

## 2021-08-05 LAB — RESP PANEL BY RT-PCR (FLU A&B, COVID) ARPGX2
Influenza A by PCR: NEGATIVE
Influenza B by PCR: NEGATIVE
SARS Coronavirus 2 by RT PCR: NEGATIVE

## 2021-08-05 LAB — URINALYSIS, ROUTINE W REFLEX MICROSCOPIC
Bilirubin Urine: NEGATIVE
Glucose, UA: NEGATIVE mg/dL
Ketones, ur: NEGATIVE mg/dL
Nitrite: NEGATIVE
Protein, ur: 100 mg/dL — AB
Specific Gravity, Urine: 1.018 (ref 1.005–1.030)
pH: 5 (ref 5.0–8.0)

## 2021-08-05 LAB — C-REACTIVE PROTEIN: CRP: 31 mg/dL — ABNORMAL HIGH (ref <1.0)

## 2021-08-05 LAB — CK: Total CK: 65 U/L (ref 38–234)

## 2021-08-05 LAB — APTT: aPTT: 45 seconds — ABNORMAL HIGH (ref 24–36)

## 2021-08-05 LAB — LACTIC ACID, PLASMA
Lactic Acid, Venous: 2.5 mmol/L (ref 0.5–1.9)
Lactic Acid, Venous: 3.1 mmol/L (ref 0.5–1.9)

## 2021-08-05 LAB — PROTIME-INR
INR: 1.1 (ref 0.8–1.2)
Prothrombin Time: 14.1 seconds (ref 11.4–15.2)

## 2021-08-05 LAB — CBG MONITORING, ED: Glucose-Capillary: 148 mg/dL — ABNORMAL HIGH (ref 70–99)

## 2021-08-05 MED ORDER — SODIUM CHLORIDE 0.45 % IV SOLN: INTRAVENOUS | Status: AC

## 2021-08-05 MED ORDER — ACETAMINOPHEN 650 MG RE SUPP
650.0000 mg | Freq: Four times a day (QID) | RECTAL | Status: DC | PRN
  Administered 2021-08-05: 650 mg via RECTAL
  Filled 2021-08-05: qty 1

## 2021-08-05 MED ORDER — IOHEXOL 350 MG/ML SOLN
100.0000 mL | Freq: Once | INTRAVENOUS | Status: AC | PRN
  Administered 2021-08-05: 100 mL via INTRAVENOUS

## 2021-08-05 MED ORDER — SODIUM CHLORIDE 0.9 % IV SOLN
2.0000 g | Freq: Three times a day (TID) | INTRAVENOUS | Status: DC
  Administered 2021-08-05: 2 g via INTRAVENOUS
  Filled 2021-08-05: qty 12.5

## 2021-08-05 MED ORDER — HYDROMORPHONE HCL 1 MG/ML IJ SOLN
0.5000 mg | INTRAMUSCULAR | Status: DC | PRN
  Administered 2021-08-06: 0.5 mg via INTRAVENOUS
  Administered 2021-08-07 – 2021-08-09 (×4): 1 mg via INTRAVENOUS
  Administered 2021-08-10: 0.5 mg via INTRAVENOUS
  Administered 2021-08-10 – 2021-08-11 (×2): 1 mg via INTRAVENOUS
  Administered 2021-08-12: 0.5 mg via INTRAVENOUS
  Administered 2021-08-12: 1 mg via INTRAVENOUS
  Administered 2021-08-13: 0.5 mg via INTRAVENOUS
  Administered 2021-08-17 – 2021-08-23 (×14): 1 mg via INTRAVENOUS
  Administered 2021-08-24: 0.5 mg via INTRAVENOUS
  Administered 2021-08-24 – 2021-08-26 (×6): 1 mg via INTRAVENOUS
  Filled 2021-08-05 (×33): qty 1

## 2021-08-05 MED ORDER — ASPIRIN 81 MG PO TBEC
81.0000 mg | DELAYED_RELEASE_TABLET | Freq: Every day | ORAL | Status: DC
  Filled 2021-08-05: qty 1

## 2021-08-05 MED ORDER — ACETAMINOPHEN 325 MG PO TABS: 650.0000 mg | ORAL_TABLET | Freq: Three times a day (TID) | ORAL | Status: DC | PRN

## 2021-08-05 MED ORDER — PANTOPRAZOLE SODIUM 40 MG PO TBEC
40.0000 mg | DELAYED_RELEASE_TABLET | Freq: Every day | ORAL | Status: DC
  Filled 2021-08-05: qty 1

## 2021-08-05 MED ORDER — ONDANSETRON HCL 4 MG/2ML IJ SOLN
4.0000 mg | Freq: Four times a day (QID) | INTRAMUSCULAR | Status: DC | PRN
  Administered 2021-08-22: 4 mg via INTRAVENOUS
  Filled 2021-08-05: qty 2

## 2021-08-05 MED ORDER — VANCOMYCIN HCL 1500 MG/300ML IV SOLN
1500.0000 mg | INTRAVENOUS | Status: DC
  Administered 2021-08-06 – 2021-08-07 (×2): 1500 mg via INTRAVENOUS
  Filled 2021-08-05 (×2): qty 300

## 2021-08-05 MED ORDER — BISACODYL 10 MG RE SUPP: 10.0000 mg | Freq: Every day | RECTAL | Status: DC | PRN

## 2021-08-05 MED ORDER — ONDANSETRON HCL 4 MG PO TABS: 4.0000 mg | ORAL_TABLET | Freq: Four times a day (QID) | ORAL | Status: DC | PRN

## 2021-08-05 MED ORDER — LACTATED RINGERS IV BOLUS
1000.0000 mL | Freq: Once | INTRAVENOUS | Status: AC
Start: 2021-08-05 — End: 2021-08-05
  Administered 2021-08-05: 1000 mL via INTRAVENOUS

## 2021-08-05 MED ORDER — LACTATED RINGERS IV SOLN: INTRAVENOUS | Status: DC

## 2021-08-05 MED ORDER — VANCOMYCIN HCL 1500 MG/300ML IV SOLN
1500.0000 mg | Freq: Once | INTRAVENOUS | Status: AC
  Administered 2021-08-05: 1500 mg via INTRAVENOUS
  Filled 2021-08-05: qty 300

## 2021-08-05 MED ORDER — ENSURE ENLIVE PO LIQD
237.0000 mL | Freq: Three times a day (TID) | ORAL | Status: DC
  Filled 2021-08-05: qty 237

## 2021-08-05 MED ORDER — GABAPENTIN 100 MG PO CAPS
100.0000 mg | ORAL_CAPSULE | Freq: Two times a day (BID) | ORAL | Status: DC
  Filled 2021-08-05: qty 1

## 2021-08-05 MED ORDER — ATORVASTATIN CALCIUM 80 MG PO TABS: 80.0000 mg | ORAL_TABLET | Freq: Every day | ORAL | Status: DC

## 2021-08-05 MED ORDER — HEPARIN SODIUM (PORCINE) 5000 UNIT/ML IJ SOLN
5000.0000 [IU] | Freq: Three times a day (TID) | INTRAMUSCULAR | Status: DC
  Administered 2021-08-05 – 2021-08-26 (×56): 5000 [IU] via SUBCUTANEOUS
  Filled 2021-08-05 (×61): qty 1

## 2021-08-05 MED ORDER — FOLIC ACID 1 MG PO TABS
1.0000 mg | ORAL_TABLET | Freq: Every day | ORAL | Status: DC
  Filled 2021-08-05: qty 1

## 2021-08-05 MED ORDER — ESCITALOPRAM OXALATE 10 MG PO TABS: 10.0000 mg | ORAL_TABLET | Freq: Every day | ORAL | Status: DC

## 2021-08-05 MED ORDER — VANCOMYCIN VARIABLE DOSE PER UNSTABLE RENAL FUNCTION (PHARMACIST DOSING)
Status: DC
Start: 2021-08-05 — End: 2021-08-07

## 2021-08-05 MED ORDER — SODIUM CHLORIDE 0.45 % IV SOLN: INTRAVENOUS | Status: DC

## 2021-08-05 MED ORDER — GUAIFENESIN ER 600 MG PO TB12: 600.0000 mg | ORAL_TABLET | Freq: Two times a day (BID) | ORAL | Status: DC | PRN

## 2021-08-05 MED ORDER — SODIUM CHLORIDE 0.9 % IV SOLN
2.0000 g | Freq: Two times a day (BID) | INTRAVENOUS | Status: DC
  Administered 2021-08-05: 2 g via INTRAVENOUS
  Filled 2021-08-05: qty 12.5

## 2021-08-05 NOTE — ED Notes (Signed)
Pt continually takes off NRB and desats to 85% on room air

## 2021-08-05 NOTE — Progress Notes (Signed)
Pharmacy Antibiotic Note  [name] [name] is a 61 y.o. female admitted on 08/05/2021 with pneumonia.  Pt recently admitted for pneumonia and treated with levofloxacin 5/7-5/11 (x5d course). Pharmacy has been consulted for cefepime and vancomyin dosing.  Temp 101.4, HR 120, RR 28  Plan: Initiate  cefepime 2g IV q8h Load vancomycin '1500mg'$  IV x1, followed by Vancomycin '1500mg'$  IV q24h (eAUC ~502)   > Goal AUC 400-550 Monitor daily WBC, temp, SCr, and clinical s/sx of infection F/u cultures and MRSA PCR and de-escalate as indicated    Temp (24hrs), Avg:101.4 F (38.6 C), Min:101.4 F (38.6 C), Max:101.4 F (38.6 C)  No results for input(s): WBC, CREATININE, LATICACIDVEN, VANCOTROUGH, VANCOPEAK, VANCORANDOM, GENTTROUGH, GENTPEAK, GENTRANDOM, TOBRATROUGH, TOBRAPEAK, TOBRARND, AMIKACINPEAK, AMIKACINTROU, AMIKACIN in the last 168 hours.  Estimated Creatinine Clearance: 68.3 mL/min (by C-G formula based on SCr of 0.73 mg/dL).    Allergies  Allergen Reactions   Apremilast Other (See Comments)    unknown   Latex Itching, Swelling and Rash    Antimicrobials this admission: Cefepime 5/22 >>  Vancomycin 5/22 >>   Dose adjustments this admission: N/A  Microbiology results: 5/22 BCx: ordered 5/22 MRSA PCR: ordered  Thank you for allowing pharmacy to be a part of this patient's care.  Kaleen Mask 08/05/2021 10:59 AM

## 2021-08-05 NOTE — ED Provider Notes (Signed)
[facility] EMERGENCY DEPARTMENT Provider Note   CSN: [id number] Arrival date & time: 08/05/21  1022     History  Chief Complaint  Patient presents with   Altered Mental Status    [name] [name] is a 61 y.o. female.   Altered Mental Status Patient brought in for mental status change from nursing home.  Comes from [place].  Reportedly found altered this morning.  Normally awake and oriented x4.  Upon arrival found to be hypoxic.  Sats in the 80s on room air and hypotensive.  Found to have a fever here.  Tachycardic.  Decreased mental status.  Recently had been admitted to the hospital and discharged on the 15th.  Had been treated for pneumonia during the admission.    Home Medications Prior to Admission medications   Medication Sig Start Date End Date Taking? Authorizing Provider  acetaminophen (TYLENOL) 650 MG CR tablet Take 1 tablet (650 mg total) by mouth every 8 (eight) hours as needed for pain. 07/25/21   Patrecia Pour, MD  amLODipine (NORVASC) 2.5 MG tablet Take 1 tablet (2.5 mg total) by mouth daily. 06/20/21   Medina-Vargas, Monina C, NP  aspirin EC 81 MG EC tablet Take 1 tablet (81 mg total) by mouth daily. 06/25/15   Nani Skillern, [name]  atorvastatin (LIPITOR) 80 MG tablet TAKE 1 TABLET EVERY DAY  AT  6PM 06/20/21   Medina-Vargas, Monina C, NP  B Complex-C (B-COMPLEX WITH VITAMIN C) tablet Place 1 tablet into feeding tube daily. 05/10/21   Ghimire, Henreitta Leber, MD  cholecalciferol (VITAMIN D) 25 MCG (1000 UNIT) tablet Take 1,000 Units by mouth in the morning and at bedtime.    [provider]  escitalopram (LEXAPRO) 10 MG tablet Take 1 tablet (10 mg total) by mouth at bedtime. 06/20/21   Medina-Vargas, Monina C, NP  Evolocumab with Infusor (REPATHA PUSHTRONEX SYSTEM) 420 MG/3.5ML SOCT Inject 420 mg into the skin every 30 (thirty) days. 04/26/20   Kroeger, Lorelee Cover., [name]  feeding supplement (ENSURE ENLIVE / ENSURE PLUS) LIQD Take 237 mLs by mouth 3  (three) times daily between meals. 07/29/21   Patrecia Pour, MD  fluconazole (DIFLUCAN) 200 MG tablet Take 1 tablet (200 mg total) by mouth daily for 10 days. 07/26/21 08/05/21  Patrecia Pour, MD  folic acid (FOLVITE) 1 MG tablet Take 1 tablet (1 mg total) by mouth daily. 07/26/21   Patrecia Pour, MD  gabapentin (NEURONTIN) 100 MG capsule Take 1 capsule (100 mg total) by mouth 2 (two) times daily. 06/20/21   Medina-Vargas, Monina C, NP  guaiFENesin (MUCINEX) 600 MG 12 hr tablet Take 1 tablet (600 mg total) by mouth 2 (two) times daily as needed (congestion.). 06/20/21   Medina-Vargas, Monina C, NP  metFORMIN (GLUCOPHAGE) 500 MG tablet Take 1 tablet (500 mg total) by mouth 2 (two) times daily. 06/20/21   Medina-Vargas, Monina C, NP  nitroGLYCERIN (NITROSTAT) 0.4 MG SL tablet DISSOLVE 1 TABLET UNDER THE TONGUE EVERY 5 MINUTES AS  NEEDED FOR CHEST PAIN. MAX  OF 3 TABLETS IN 15 MINUTES. CALL 911 IF PAIN PERSISTS. 06/20/21   Medina-Vargas, Monina C, NP  ondansetron (ZOFRAN-ODT) 4 MG disintegrating tablet Take 1 tablet (4 mg total) by mouth every 8 (eight) hours as needed. 06/20/21   Medina-Vargas, Monina C, NP  pantoprazole (PROTONIX) 40 MG tablet Take 1 tablet (40 mg total) by mouth daily. 06/20/21   Medina-Vargas, Senaida Lange, NP      Allergies  Apremilast and Latex    Review of Systems   Review of Systems  Physical Exam Updated Vital Signs BP (!) 101/59   Pulse 87   Temp (!) 101.4 F (38.6 C) (Rectal)   Resp (!) 27   SpO2 92%  Physical Exam Vitals and nursing note reviewed.  Eyes:     General: No scleral icterus. Cardiovascular:     Rate and Rhythm: Tachycardia present.  Pulmonary:     Comments: Harsh breath sounds. Abdominal:     Tenderness: There is no abdominal tenderness.  Musculoskeletal:     Right lower leg: No edema.     Left lower leg: No edema.  Skin:    General: Skin is warm.     Capillary Refill: Capillary refill takes less than 2 seconds.  Neurological:     Comments: Decreased  responsiveness.  Will make some sounds but really not verbal.  Will not follow commands.  Appears to moving all extremities.    ED Results / Procedures / Treatments   Labs (all labs ordered are listed, but only abnormal results are displayed) Labs Reviewed  LACTIC ACID, PLASMA - Abnormal; Notable for the following components:      Result Value   Lactic Acid, Venous 2.5 (*)    All other components within normal limits  COMPREHENSIVE METABOLIC PANEL - Abnormal; Notable for the following components:   Sodium 149 (*)    Potassium 5.2 (*)    Chloride 117 (*)    Glucose, Bld 129 (*)    BUN 31 (*)    Creatinine, Ser 1.54 (*)    Albumin 1.5 (*)    AST 75 (*)    Alkaline Phosphatase 223 (*)    GFR, Estimated 38 (*)    All other components within normal limits  CBC WITH DIFFERENTIAL/PLATELET - Abnormal; Notable for the following components:   WBC 12.2 (*)    RDW 16.1 (*)    Neutro Abs 10.9 (*)    Abs Immature Granulocytes 0.11 (*)    All other components within normal limits  APTT - Abnormal; Notable for the following components:   aPTT 45 (*)    All other components within normal limits  URINALYSIS, ROUTINE W REFLEX MICROSCOPIC - Abnormal; Notable for the following components:   APPearance TURBID (*)    Hgb urine dipstick MODERATE (*)    Protein, ur 100 (*)    Leukocytes,Ua MODERATE (*)    Bacteria, UA MANY (*)    All other components within normal limits  CBG MONITORING, ED - Abnormal; Notable for the following components:   Glucose-Capillary 148 (*)    All other components within normal limits  I-STAT ARTERIAL BLOOD GAS, ED - Abnormal; Notable for the following components:   pO2, Arterial 73 (*)    Sodium 151 (*)    HCT 34.0 (*)    Hemoglobin 11.6 (*)    All other components within normal limits  RESP PANEL BY RT-PCR (FLU A&B, COVID) ARPGX2  CULTURE, BLOOD (ROUTINE X 2)  CULTURE, BLOOD (ROUTINE X 2)  URINE CULTURE  PROTIME-INR  LACTIC ACID, PLASMA    EKG EKG  Interpretation  Date/Time:  Monday Aug 05 2021 10:30:16 EDT Ventricular Rate:  124 PR Interval:  135 QRS Duration: 90 QT Interval:  333 QTC Calculation: 479 R Axis:   59 Text Interpretation: Sinus tachycardia Left atrial enlargement Inferior infarct, old Confirmed by Davonna Belling [phone]) on 08/05/2021 10:32:29 AM  Radiology CT Angio Chest PE W and/or Wo  Contrast  Result Date: 08/05/2021 CLINICAL DATA:  High clinical suspicion for pulmonary embolism EXAM: CT ANGIOGRAPHY CHEST WITH CONTRAST TECHNIQUE: Multidetector CT imaging of the chest was performed using the standard protocol during bolus administration of intravenous contrast. Multiplanar CT image reconstructions and MIPs were obtained to evaluate the vascular anatomy. RADIATION DOSE REDUCTION: This exam was performed according to the departmental dose-optimization program which includes automated exposure control, adjustment of the mA and/or kV according to patient size and/or use of iterative reconstruction technique. CONTRAST:  144m OMNIPAQUE IOHEXOL 350 MG/ML SOLN COMPARISON:  Previous studies including the chest radiographs done on 08/05/2021 and CT chest done on 07/18/2021 FINDINGS: Cardiovascular: There is homogeneous enhancement in the thoracic aorta. There is ectasia of ascending thoracic aorta measuring 3.7 cm. Scattered coronary artery calcifications are seen. Metallic sutures are seen in the sternum suggesting possible previous coronary bypass surgery. Heart is enlarged in size. There are no intraluminal filling defects in the central pulmonary artery branches. Evaluation of small peripheral branches in the lower lung fields is limited by infiltrates and less than optimal contrast enhancement. Mediastinum/Nodes: No significant lymphadenopathy seen. Lungs/Pleura: There are patchy infiltrates in the posterior aspect of both lower lung fields more so on the right side. Small right pleural effusion is seen. There is no pneumothorax.  Upper Abdomen: Unremarkable. Musculoskeletal: Unremarkable. Review of the MIP images confirms the above findings. IMPRESSION: There is no evidence of pulmonary artery embolism. There is no evidence of thoracic aortic dissection. There is ectasia of ascending thoracic aorta measuring 3.7 cm. Coronary artery disease. There are patchy infiltrates in both lower lung fields, more so on the right side suggesting atelectasis/pneumonia. Small right pleural effusion is seen. Electronically Signed   By: PElmer PickerM.D.   On: 08/05/2021 14:48   DG Chest Port 1 View  Result Date: 08/05/2021 CLINICAL DATA:  Fever.  Possible sepsis. EXAM: PORTABLE CHEST 1 VIEW COMPARISON:  Chest x-ray dated Jul 21, 2021. FINDINGS: Unchanged mild cardiomegaly status post CABG. Normal pulmonary vascularity. Unchanged trace right pleural effusion. No consolidation or pneumothorax. No acute osseous abnormality. IMPRESSION: 1. Unchanged trace right pleural effusion. Electronically Signed   By: WTitus DubinM.D.   On: 08/05/2021 12:07    Procedures Procedures    Medications Ordered in ED Medications  lactated ringers infusion ( Intravenous New Bag/Given 08/05/21 1409)  ceFEPIme (MAXIPIME) 2 g in sodium chloride 0.9 % 100 mL IVPB (0 g Intravenous Stopped 08/05/21 1151)  vancomycin (VANCOREADY) IVPB 1500 mg/300 mL (has no administration in time range)  vancomycin (VANCOREADY) IVPB 1500 mg/300 mL (0 mg Intravenous Stopped 08/05/21 1409)  lactated ringers bolus 1,000 mL (0 mLs Intravenous Stopped 08/05/21 1308)  iohexol (OMNIPAQUE) 350 MG/ML injection 100 mL (100 mLs Intravenous Contrast Given 08/05/21 1430)    ED Course/ Medical Decision Making/ A&P                           Medical Decision Making Amount and/or Complexity of Data Reviewed Labs: ordered. Radiology: ordered. ECG/medicine tests: ordered.  Risk Prescription drug management.   Patient brought in for hypoxia and mental status change.  Found to have sats  down to the 80s.  Initially was even having difficulty keeping up on nonrebreather but later improved.  Initial tachycardia.  Initial hypotension.  Initial febrile.  Code sepsis called and given antibiotics, however with CHF history and mean arterial pressure that have been above 65 fluids given judiciously initially.  Blood pressures  improved.  Initial lactic acid was not above 4.  Chest x-ray was somewhat delayed but independently interpreted when it was done and showed pleural effusion that was really unchanged.  Urine does show infection.  Sodium is elevated.  Does have acute kidney injury with creatinine now up to 1.5.  Mental status improved.  Initially nonverbal now much more alert but not necessarily at baseline.  Cultures been sent.  Will admit to hospitalist.  Has been rechecked multiple times and on most recent recheck after coming back from CT the mask was off her face and actually disconnected from the wall and sats are still at 100.  We will attempt again to wean down to nasal cannula.  CT scan showed potential pneumonia versus atelectasis.  I discussed with pharmacist earlier who had ordered specific antibiotics since she had recently been on Levaquin.  CRITICAL CARE Performed by: Davonna Belling Total critical care time: 30 minutes Critical care time was exclusive of separately billable procedures and treating other patients. Critical care was necessary to treat or prevent imminent or life-threatening deterioration. Critical care was time spent personally by me on the following activities: development of treatment plan with patient and/or surrogate as well as nursing, discussions with consultants, evaluation of patient's response to treatment, examination of patient, obtaining history from patient or surrogate, ordering and performing treatments and interventions, ordering and review of laboratory studies, ordering and review of radiographic studies, pulse oximetry and re-evaluation of  patient's condition.         Final Clinical Impression(s) / ED Diagnoses Final diagnoses:  Sepsis, due to unspecified organism, unspecified whether acute organ dysfunction present [facility])  Urinary tract infection without hematuria, site unspecified  Hypoxia    Rx / DC Orders ED Discharge Orders     None         Davonna Belling, MD 08/05/21 1458

## 2021-08-05 NOTE — H&P (Signed)
History and Physical    [name] [name] MOQ:[id number] DOB: [birth year] DOA: 08/05/2021  PCP: Leeroy Cha, MD (Confirm with patient/family/NH records and if not entered, this has to be entered at [facility] point of entry) Patient coming from: SNF  I have personally briefly reviewed patient's old medical records in [place]  Chief Complaint: Whole body pain  HPI: [name] [name] is a 61 y.o. female with medical history significant of failure to thrive, dysphagia secondary to moderate esophageal dysmotility (barium swallow March 2023), psoriasis arthritis, CAD status post CABG, DM-2, chronic systolic CHF LVEF 35%, was sent to hospital for evaluation of altered mentations hypoxia and fever.  Patient was recently hospitalized and discharged to nursing home 11 days ago after treatment of supposed empyema and pneumonia.  Patient has established history of failure to thrive and has been losing weight, and according to the record she lost about 13 kg in last 49-month.   Patient unable to provide any history as she is confused and agitated.  All history provided by patient and [name]([phone], and patient was discharged on soft diet, last week, her diet was downgraded to pured.  Despite, family observed several times patient had choking episode after eating for food.  This morning, family was contacted about patient had after mentations and hypoxia  ED Course: Patient was found hypoxic initially briefly on 15 L of nonrebreather and titrated down to 5 L, tachycardia hypotensive but responded to IV fluid.  Febrile 101.4  Angiogram negative for PE but patchy infiltrates on bilateral lower lung fields suspicious for pneumonia.  Small right-sided pleural effusion.  Patient was started on vancomycin and cefepime.  Sodium 149, K5.2, creatinine 1.5, WBC 12.2.  Review of Systems: Unable to perform, patient confused.  Past Medical History:  Diagnosis Date   Acute ST elevation myocardial  infarction (STEMI) involving right coronary artery ([place] 10/04/2015   x 2   Anxiety    Anxiety and depression    CAD (coronary artery disease)    a. CABG 06/2015: LIMA-LAD, SVG-RCA, SVG-OM2 b. STEMI s/p PCI to distal RCA lesion with a small Promus Premier stent. ( patent LIMA to the LAD, occluded SVG to OM, occluded SVG to RCA.)   CAD (coronary artery disease) of artery bypass graft; occluded SVG to RCA and occluded SVG to OM 10/08/2015   Empty sella (HCC)    GERD (gastroesophageal reflux disease)    Headache    from stress   Hematuria wed 06-13-2020   Hidradenitis axillaris    History of kidney stones    Hyperlipemia    Hypertension    Lupus [place] dx[place] 2008   "they think I have this; have to get retested" (06/12/2015)   Obesity (BMI 30.0-34.9)    Pneumonia X 2   last time yrs ago   PONV (postoperative nausea and vomiting)    likes iv med or scopolamine patch works well   Psoriasis 06/15/2020   hands elbows, ears, small amount on scalp and bridge of nose   Rheumatoid aortitis    psorriatic arthritis   Rheumatoid arthritis ([place]    S/P angioplasty with stent; 10/04/15 to distal RCA lesion. with Promus premier. 10/08/2015   Scoliosis    Sleep apnea    patient states "it is mild" no cpap needed   Stroke ([facility] March 2014   left MCA branches; "they say I have them all the time", denies residual on 06/12/2015   Tobacco abuse    Type II diabetes mellitus [place]  Urinary frequency    and urgency   Wears glasses    for reading    Past Surgical History:  Procedure Laterality Date   ABDOMINAL HYSTERECTOMY  31 yrs ago   total has ovaies   BREAST BIOPSY Bilateral yrd ago   benign   CARDIAC CATHETERIZATION N/A 06/13/2015   Procedure: Left Heart Cath and Coronary Angiography;  Surgeon: Peter M Martinique, MD;  Location: [place] [facility];  Service: Cardiovascular;  Laterality: N/A;   CARDIAC CATHETERIZATION N/A 10/04/2015   Procedure: Left Heart Cath and Coronary Angiography;  Surgeon:  [name], MD;  Location: [place] [facility];  Service: Cardiovascular;  Laterality: N/A;   CARDIAC CATHETERIZATION N/A 10/04/2015   Procedure: Coronary Stent Intervention;  Surgeon: [name], MD;  Location: [place] [facility];  Service: Cardiovascular;  Laterality: N/A;   CORONARY ARTERY BYPASS GRAFT N/A 06/19/2015   Procedure: CORONARY ARTERY BYPASS GRAFTING (CABG) TIMES FOUR USING LEFT INTERNAL MAMMARY, RIGHT SAPHENOUS LEG VEIN AND CRYO SAPHENOUS VEIN;  Surgeon: [name], MD;  Location: [place];  Service: Open Heart Surgery;  Laterality: N/A;   CYSTOSCOPY WITH RETROGRADE PYELOGRAM, URETEROSCOPY AND STENT PLACEMENT Left 06/21/2020   Procedure: CYSTOSCOPY WITH RETROGRADE PYELOGRAM, URETEROSCOPY, BASKET STONE EXTRACTION AND  STENT PLACEMENT;  Surgeon: Robley Fries, MD;  Location[facility];  Service: Urology;  Laterality: Left;   DILATION AND CURETTAGE OF UTERUS  yrs ago   HOLMIUM LASER APPLICATION Left 05/17/231   Procedure: HOLMIUM LASER APPLICATION;  Surgeon: Robley Fries, MD;  [facility];  Service: Urology;  Laterality: Left;   HYDRADENITIS EXCISION Right 12/20/2019   Procedure: EXCISION OF RIGHT  HIDRADENITIS AXILLARY;  Surgeon: Donnie Mesa, MD;  Location: [place];  Service: General;  Laterality: Right;   IR URETERAL STENT LEFT NEW ACCESS W/O SEP NEPHROSTOMY CATH  01/10/2019   NEPHROLITHOTOMY Left 01/10/2019   Procedure: NEPHROLITHOTOMY PERCUTANEOUS;  Surgeon: Kathie Rhodes, MD;  Location: [facility];  Service: Urology;  Laterality: Left;   TEE WITHOUT CARDIOVERSION N/A 06/19/2015   Procedure: TRANSESOPHAGEAL ECHOCARDIOGRAM (TEE);  Surgeon: [name], MD;  Location: [place];  Service: Open Heart Surgery;  Laterality: N/A;   TONSILLECTOMY  age 70   TUBAL LIGATION  many yrs ago     reports that she quit smoking about 7 years ago. Her smoking use included cigarettes. She has a 18.00 pack-year smoking history. She has never  used smokeless tobacco. She reports that she does not drink alcohol and does not use drugs.  Allergies  Allergen Reactions   Apremilast Other (See Comments)    unknown   Latex Itching, Swelling and Rash    Family History  Problem Relation Age of Onset   Diabetes Mother    Heart failure Mother    Breast cancer Mother        74s   Stroke Maternal Aunt    Depression Son    Diabetes Other        maternal   Breast cancer Other        2 diff great maternal aunts -  both 29s   Alzheimer's disease Other        maternal     Prior to Admission medications   Medication Sig Start Date End Date Taking? Authorizing Provider  acetaminophen (TYLENOL) 650 MG CR tablet Take 1 tablet (650 mg total) by mouth every 8 (eight) hours as needed for pain. 07/25/21   Vance Gather  B, MD  amLODipine (NORVASC) 2.5 MG tablet Take 1 tablet (2.5 mg total) by mouth daily. 06/20/21   Medina-Vargas, Monina C, NP  aspirin EC 81 MG EC tablet Take 1 tablet (81 mg total) by mouth daily. 06/25/15   Nani Skillern, [name]  atorvastatin (LIPITOR) 80 MG tablet TAKE 1 TABLET EVERY DAY  AT  6PM 06/20/21   Medina-Vargas, Monina C, NP  B Complex-C (B-COMPLEX WITH VITAMIN C) tablet Place 1 tablet into feeding tube daily. 05/10/21   Ghimire, Henreitta Leber, MD  cholecalciferol (VITAMIN D) 25 MCG (1000 UNIT) tablet Take 1,000 Units by mouth in the morning and at bedtime.    [provider]  escitalopram (LEXAPRO) 10 MG tablet Take 1 tablet (10 mg total) by mouth at bedtime. 06/20/21   Medina-Vargas, Monina C, NP  Evolocumab with Infusor (REPATHA PUSHTRONEX SYSTEM) 420 MG/3.5ML SOCT Inject 420 mg into the skin every 30 (thirty) days. 04/26/20   Kroeger, Lorelee Cover., [name]  feeding supplement (ENSURE ENLIVE / ENSURE PLUS) LIQD Take 237 mLs by mouth 3 (three) times daily between meals. 07/29/21   Patrecia Pour, MD  fluconazole (DIFLUCAN) 200 MG tablet Take 1 tablet (200 mg total) by mouth daily for 10 days. 07/26/21 08/05/21  Patrecia Pour,  MD  folic acid (FOLVITE) 1 MG tablet Take 1 tablet (1 mg total) by mouth daily. 07/26/21   Patrecia Pour, MD  gabapentin (NEURONTIN) 100 MG capsule Take 1 capsule (100 mg total) by mouth 2 (two) times daily. 06/20/21   Medina-Vargas, Monina C, NP  guaiFENesin (MUCINEX) 600 MG 12 hr tablet Take 1 tablet (600 mg total) by mouth 2 (two) times daily as needed (congestion.). 06/20/21   Medina-Vargas, Monina C, NP  metFORMIN (GLUCOPHAGE) 500 MG tablet Take 1 tablet (500 mg total) by mouth 2 (two) times daily. 06/20/21   Medina-Vargas, Monina C, NP  nitroGLYCERIN (NITROSTAT) 0.4 MG SL tablet DISSOLVE 1 TABLET UNDER THE TONGUE EVERY 5 MINUTES AS  NEEDED FOR CHEST PAIN. MAX  OF 3 TABLETS IN 15 MINUTES. CALL 911 IF PAIN PERSISTS. 06/20/21   Medina-Vargas, Monina C, NP  ondansetron (ZOFRAN-ODT) 4 MG disintegrating tablet Take 1 tablet (4 mg total) by mouth every 8 (eight) hours as needed. 06/20/21   Medina-Vargas, Monina C, NP  pantoprazole (PROTONIX) 40 MG tablet Take 1 tablet (40 mg total) by mouth daily. 06/20/21   Medina-Vargas, Jaymes Graff C, NP    Physical Exam: Vitals:   08/05/21 1230 08/05/21 1245 08/05/21 1300 08/05/21 1500  BP: 107/73 117/72 (!) 101/59 132/87  Pulse: 97 98 87 92  Resp: (!) 21 (!) 27 (!) 27 12  Temp:      TempSrc:      SpO2: 94% 92%  95%    Constitutional: NAD, calm, comfortable Vitals:   08/05/21 1230 08/05/21 1245 08/05/21 1300 08/05/21 1500  BP: 107/73 117/72 (!) 101/59 132/87  Pulse: 97 98 87 92  Resp: (!) 21 (!) 27 (!) 27 12  Temp:      TempSrc:      SpO2: 94% 92%  95%   Eyes: PERRL, lids and conjunctivae normal ENMT: Mucous membranes are dry. Posterior pharynx clear of any exudate or lesions.Normal dentition.  Neck: normal, supple, no masses, no thyromegaly Respiratory: clear to auscultation bilaterally, no wheezing, coarse crackles on bilateral lower fields. Normal respiratory effort. No accessory muscle use.  Cardiovascular: Regular rate and rhythm, no murmurs / rubs /  gallops. No extremity edema. 2+ pedal pulses. No carotid  bruits.  Abdomen: no tenderness, no masses palpated. No hepatosplenomegaly. Bowel sounds positive.  Musculoskeletal: no clubbing / cyanosis. No joint deformity upper and lower extremities. Good ROM, no contractures. Normal muscle tone.  Skin: no rashes, lesions, ulcers. No induration Neurologic: No facial droops, moving all limbs, not following commands Psychiatric: Awake, oriented to herself, confused about time and place    Labs on Admission: I have personally reviewed following labs and imaging studies  CBC: Recent Labs  Lab 08/05/21 1107 08/05/21 1153  WBC 12.2*  --   NEUTROABS 10.9*  --   HGB 13.7 11.6*  HCT 44.4 34.0*  MCV 95.5  --   PLT 179  --    Basic Metabolic Panel: Recent Labs  Lab 08/05/21 1107 08/05/21 1153  NA 149* 151*  K 5.2* 3.9  CL 117*  --   CO2 23  --   GLUCOSE 129*  --   BUN 31*  --   CREATININE 1.54*  --   CALCIUM 8.9  --    GFR: Estimated Creatinine Clearance: 35.5 mL/min (A) (by C-G formula based on SCr of 1.54 mg/dL (H)). Liver Function Tests: Recent Labs  Lab 08/05/21 1107  AST 75*  ALT 21  ALKPHOS 223*  BILITOT 1.2  PROT 7.1  ALBUMIN 1.5*   No results for input(s): LIPASE, AMYLASE in the last 168 hours. No results for input(s): AMMONIA in the last 168 hours. Coagulation Profile: Recent Labs  Lab 08/05/21 1107  INR 1.1   Cardiac Enzymes: No results for input(s): CKTOTAL, CKMB, CKMBINDEX, TROPONINI in the last 168 hours. BNP (last 3 results) No results for input(s): PROBNP in the last 8760 hours. HbA1C: No results for input(s): HGBA1C in the last 72 hours. CBG: Recent Labs  Lab 08/05/21 1048  GLUCAP 148*   Lipid Profile: No results for input(s): CHOL, HDL, LDLCALC, TRIG, CHOLHDL, LDLDIRECT in the last 72 hours. Thyroid Function Tests: No results for input(s): TSH, T4TOTAL, FREET4, T3FREE, THYROIDAB in the last 72 hours. Anemia Panel: No results for input(s):  VITAMINB12, FOLATE, FERRITIN, TIBC, IRON, RETICCTPCT in the last 72 hours. Urine analysis:    Component Value Date/Time   COLORURINE YELLOW 08/05/2021 1046   APPEARANCEUR TURBID (A) 08/05/2021 1046   LABSPEC 1.018 08/05/2021 1046   PHURINE 5.0 08/05/2021 1046   GLUCOSEU NEGATIVE 08/05/2021 1046   HGBUR MODERATE (A) 08/05/2021 1046   BILIRUBINUR NEGATIVE 08/05/2021 1046   KETONESUR NEGATIVE 08/05/2021 1046   PROTEINUR 100 (A) 08/05/2021 1046   UROBILINOGEN 1.0 06/17/2012 1909   NITRITE NEGATIVE 08/05/2021 1046   LEUKOCYTESUR MODERATE (A) 08/05/2021 1046    Radiological Exams on Admission: CT Angio Chest PE W and/or Wo Contrast  Result Date: 08/05/2021 CLINICAL DATA:  High clinical suspicion for pulmonary embolism EXAM: CT ANGIOGRAPHY CHEST WITH CONTRAST TECHNIQUE: Multidetector CT imaging of the chest was performed using the standard protocol during bolus administration of intravenous contrast. Multiplanar CT image reconstructions and MIPs were obtained to evaluate the vascular anatomy. RADIATION DOSE REDUCTION: This exam was performed according to the departmental dose-optimization program which includes automated exposure control, adjustment of the mA and/or kV according to patient size and/or use of iterative reconstruction technique. CONTRAST:  14m OMNIPAQUE IOHEXOL 350 MG/ML SOLN COMPARISON:  Previous studies including the chest radiographs done on 08/05/2021 and CT chest done on 07/18/2021 FINDINGS: Cardiovascular: There is homogeneous enhancement in the thoracic aorta. There is ectasia of ascending thoracic aorta measuring 3.7 cm. Scattered coronary artery calcifications are seen. Metallic sutures are seen  in the sternum suggesting possible previous coronary bypass surgery. Heart is enlarged in size. There are no intraluminal filling defects in the central pulmonary artery branches. Evaluation of small peripheral branches in the lower lung fields is limited by infiltrates and less than  optimal contrast enhancement. Mediastinum/Nodes: No significant lymphadenopathy seen. Lungs/Pleura: There are patchy infiltrates in the posterior aspect of both lower lung fields more so on the right side. Small right pleural effusion is seen. There is no pneumothorax. Upper Abdomen: Unremarkable. Musculoskeletal: Unremarkable. Review of the MIP images confirms the above findings. IMPRESSION: There is no evidence of pulmonary artery embolism. There is no evidence of thoracic aortic dissection. There is ectasia of ascending thoracic aorta measuring 3.7 cm. Coronary artery disease. There are patchy infiltrates in both lower lung fields, more so on the right side suggesting atelectasis/pneumonia. Small right pleural effusion is seen. Electronically Signed   By: Elmer Picker M.D.   On: 08/05/2021 14:48   DG Chest Port 1 View  Result Date: 08/05/2021 CLINICAL DATA:  Fever.  Possible sepsis. EXAM: PORTABLE CHEST 1 VIEW COMPARISON:  Chest x-ray dated Jul 21, 2021. FINDINGS: Unchanged mild cardiomegaly status post CABG. Normal pulmonary vascularity. Unchanged trace right pleural effusion. No consolidation or pneumothorax. No acute osseous abnormality. IMPRESSION: 1. Unchanged trace right pleural effusion. Electronically Signed   By: Titus Dubin M.D.   On: 08/05/2021 12:07    EKG: Independently reviewed.  Sinus tachycardia, chronic RBBB, no acute ST changes.  Assessment/Plan Principal Problem:   Sepsis ([place]) Active Problems:   Adult failure to thrive syndrome  (please populate well all problems here in Problem List. (For example, if patient is on BP meds at home and you resume or decide to hold them, it is a problem that needs to be her. Same for CAD, COPD, HLD and so on)  Sepsis -Evidenced by tachycardia, new onset hypoxia, fever, suspected source is pneumonia plus minus UTI.  With signs of endorgan damage for AKI and acute encephalopathy -History of aspiration and radiology pattern of the  pneumonia pneumonia with concern about aspiration pneumonia -Continue current vancomycin and cefepime regimen -N.p.o. for now -speech evaluation -Calorie count, briefly discussed with son about possible future feeding tube if cannot meet the calorie intake demand or recurrent episode of aspiration pneumonia despite downgrade to pured food.  Acute on chronic dysphagia -Patient had a formal video swallow evaluation in March this year showed moderate dysmotility.  Question of whether patient able to intake enough calories by current nutrition regimen as patient keeps losing weight of 15 kg's January of this year. -Also noticed that the patient was discharged with Diflucan treatment of the fungal infection on esophageal on last admission.  AKI -Severe dehydration and volume contraction -Blood pressure stabilized, change hydration regimen to half-normal saline to correct sodium level  UTI with hematuria -Antibiotic regimen for both pneumonia and UTI as of now.  Hypernatremia -Severe dehydration, calculated free water deficit 2 L -Received IV boluses -Change hydration to half-normal saline  Hyperkalemia -Mild, secondary to AKI, hydration and then reevaluate.  Acute metabolic encephalopathy -Secondary sepsis, treat sepsis then reevaluate.  HTN -Hold off home BP meds for now.  Chronic systolic CHF -Severe volume contraction at this point and hypoxia likely from bilateral pneumonia -Hydration with caution for 12 hours then reevaluate and monitor oxygen status.  Psoriasis arthritis -No clear etiology of her esophageal dysmotility.  Unsure whether this is related to her psoriasis arthritis which appears to be poorly controlled as patient complaining  about whole body aching.  Check ESR and CRP.  Outpatient follow-up with rheumatology  DVT prophylaxis: Heparin subcu Code Status: Full code Family Communication: Son over the phone Disposition Plan: Patient is sick with recurrent aspiration  pneumonia hypoxia and sepsis, expect more than 2 midnight hospital stay Consults called: None Admission status: Telemetry admission   Lequita Halt MD [facility] Pager [phone]  08/05/2021, 3:33 PM

## 2021-08-05 NOTE — ED Notes (Signed)
ED TO INPATIENT HANDOFF REPORT  ED Nurse Name and Phone #: Lysbeth Galas [phone]  S Name/Age/Gender [name] [name] 61 y.o. female Room/Bed: 024C/024C  Code Status   Code Status: Full Code  Home/SNF/Other Nursing Home Patient oriented to: self, place, and time Is this baseline? No   Triage Complete: Triage complete  Chief Complaint Sepsis [facility]) [A41.9]  Triage Note Pt bib [facility] from [facility] with complaints of being found altered this am by staff. Pt is normally AOx4 and per staff was at baseline yesterday. Pt presents combative, trying to get out of bed, and only alert to name. Pt oxygen sats found to be 88% on room air at facility, pt arrives to ED with NRB on and pulling it off.  EMS vitals: 98/60, 120HR, 99.4, 88%NRB at 15L   Allergies Allergies  Allergen Reactions   Apremilast Other (See Comments)    unknown   Latex Itching, Swelling and Rash    Level of Care/Admitting Diagnosis ED Disposition     ED Disposition  Admit   Condition  --   [place]: [place] [[id number]]  Level of Care: Telemetry Medical [[id number]]  May admit patient to [facility] or [facility] if equivalent level of care is available:: No  Covid Evaluation: Asymptomatic - no recent exposure (last 10 days) testing not required  Diagnosis: Sepsis [facility]) [[id number]]  Admitting Physician: Lequita Halt [[id number]]  Attending Physician: Lequita Halt [[id number]]  Estimated length of stay: past midnight tomorrow  Certification:: I certify this patient will need inpatient services for at least 2 midnights          B Medical/Surgery History Past Medical History:  Diagnosis Date   Acute ST elevation myocardial infarction (STEMI) involving right coronary artery ([place]) 10/04/2015   x 2   Anxiety    Anxiety and depression    CAD (coronary artery disease)    a. CABG 06/2015: LIMA-LAD, SVG-RCA, SVG-OM2 b. STEMI s/p PCI to distal RCA lesion with a small Promus Premier stent. (  patent LIMA to the LAD, occluded SVG to OM, occluded SVG to RCA.)   CAD (coronary artery disease) of artery bypass graft; occluded SVG to RCA and occluded SVG to OM 10/08/2015   Empty sella (HCC)    GERD (gastroesophageal reflux disease)    Headache    from stress   Hematuria wed 06-13-2020   Hidradenitis axillaris    History of kidney stones    Hyperlipemia    Hypertension    Lupus ([place]) dx'd 2008   "they think I have this; have to get retested" (06/12/2015)   Obesity (BMI 30.0-34.9)    Pneumonia X 2   last time yrs ago   PONV (postoperative nausea and vomiting)    likes iv med or scopolamine patch works well   Psoriasis 06/15/2020   hands elbows, ears, small amount on scalp and bridge of nose   Rheumatoid aortitis    psorriatic arthritis   Rheumatoid arthritis ([place])    S/P angioplasty with stent; 10/04/15 to distal RCA lesion. with Promus premier. 10/08/2015   Scoliosis    Sleep apnea    patient states "it is mild" no cpap needed   Stroke [facility]) March 2014   left MCA branches; "they say I have them all the time", denies residual on 06/12/2015   Tobacco abuse    Type II diabetes mellitus ([place])    Urinary frequency    and urgency   Wears glasses  for reading   Past Surgical History:  Procedure Laterality Date   ABDOMINAL HYSTERECTOMY  31 yrs ago   total has ovaies   BREAST BIOPSY Bilateral yrd ago   benign   CARDIAC CATHETERIZATION N/A 06/13/2015   Procedure: Left Heart Cath and Coronary Angiography;  Surgeon: Peter M Martinique, MD;  Location: [place] [facility];  Service: Cardiovascular;  Laterality: N/A;   CARDIAC CATHETERIZATION N/A 10/04/2015   Procedure: Left Heart Cath and Coronary Angiography;  Surgeon: [name], MD;  Location: [place] [facility];  Service: Cardiovascular;  Laterality: N/A;   CARDIAC CATHETERIZATION N/A 10/04/2015   Procedure: Coronary Stent Intervention;  Surgeon: [name], MD;  Location: [place] [facility];  Service: Cardiovascular;   Laterality: N/A;   CORONARY ARTERY BYPASS GRAFT N/A 06/19/2015   Procedure: CORONARY ARTERY BYPASS GRAFTING (CABG) TIMES FOUR USING LEFT INTERNAL MAMMARY, RIGHT SAPHENOUS LEG VEIN AND CRYO SAPHENOUS VEIN;  Surgeon: [name], MD;  Location: [place];  Service: Open Heart Surgery;  Laterality: N/A;   CYSTOSCOPY WITH RETROGRADE PYELOGRAM, URETEROSCOPY AND STENT PLACEMENT Left 06/21/2020   Procedure: CYSTOSCOPY WITH RETROGRADE PYELOGRAM, URETEROSCOPY, BASKET STONE EXTRACTION AND  STENT PLACEMENT;  Surgeon: Robley Fries, MD;  Location[facility];  Service: Urology;  Laterality: Left;   DILATION AND CURETTAGE OF UTERUS  yrs ago   HOLMIUM LASER APPLICATION Left 4/0/3474   Procedure: HOLMIUM LASER APPLICATION;  Surgeon: Robley Fries, MD;  Location: [facility];  Service: Urology;  Laterality: Left;   HYDRADENITIS EXCISION Right 12/20/2019   Procedure: EXCISION OF RIGHT  HIDRADENITIS AXILLARY;  Surgeon: Donnie Mesa, MD;  Location: [place];  Service: General;  Laterality: Right;   IR URETERAL STENT LEFT NEW ACCESS W/O SEP NEPHROSTOMY CATH  01/10/2019   NEPHROLITHOTOMY Left 01/10/2019   Procedure: NEPHROLITHOTOMY PERCUTANEOUS;  Surgeon: Kathie Rhodes, MD;  Location: [facility];  Service: Urology;  Laterality: Left;   TEE WITHOUT CARDIOVERSION N/A 06/19/2015   Procedure: TRANSESOPHAGEAL ECHOCARDIOGRAM (TEE);  Surgeon: [name], MD;  Location: [place];  Service: Open Heart Surgery;  Laterality: N/A;   TONSILLECTOMY  age 61   TUBAL LIGATION  many yrs ago     A IV Location/Drains/Wounds Patient Lines/Drains/Airways Status     Active Line/Drains/Airways     Name Placement date Placement time Site Days   Peripheral IV 08/05/21 20 G Anterior;Right Forearm 08/05/21  1057  Forearm  less than 1   Peripheral IV 08/05/21 20 G Anterior;Left Forearm 08/05/21  1057  Forearm  less than 1   External Urinary Catheter 07/24/21  0847  --  12   Pressure Injury 05/01/21 Sacrum Mid  Stage 1 -  Intact skin with non-blanchable redness of a localized area usually over a bony prominence. 05/01/21  0400  -- 96   Pressure Injury 05/08/21 Buttocks Left;Right Stage 2 -  Partial thickness loss of dermis presenting as a shallow open injury with a red, pink wound bed without slough. 05/08/21  1951  -- 89   Pressure Injury 07/19/21 Thigh Anterior;Proximal;Right Stage 2 -  Partial thickness loss of dermis presenting as a shallow open injury with a red, pink wound bed without slough. 07/19/21  2300  -- 17   Pressure Injury 07/20/21 Coccyx Medial Stage 2 -  Partial thickness loss of dermis presenting as a shallow open injury with a red, pink wound bed without slough. 07/20/21  2300  -- 16  Intake/Output Last 24 hours No intake or output data in the 24 hours ending 08/05/21 1600  Labs/Imaging Results for orders placed or performed during the hospital encounter of 08/05/21 (from the past 48 hour(s))  Resp Panel by RT-PCR (Flu A&B, Covid) Nasopharyngeal Swab     Status: None   Collection Time: 08/05/21 10:46 AM   Specimen: Nasopharyngeal Swab; Nasopharyngeal(NP) swabs in vial transport medium  Result Value Ref Range   SARS Coronavirus 2 by RT PCR NEGATIVE NEGATIVE    Comment: (NOTE) SARS-CoV-2 target nucleic acids are NOT DETECTED.  The SARS-CoV-2 RNA is generally detectable in upper respiratory specimens during the acute phase of infection. The lowest concentration of SARS-CoV-2 viral copies this assay can detect is 138 copies/mL. A negative result does not preclude SARS-Cov-2 infection and should not be used as the sole basis for treatment or other patient management decisions. A negative result may occur with  improper specimen collection/handling, submission of specimen other than nasopharyngeal swab, presence of viral mutation(s) within the areas targeted by this assay, and inadequate number of viral copies(<138 copies/mL). A negative result must be combined  with clinical observations, patient history, and epidemiological information. The expected result is Negative.  Fact Sheet for Patients:  EntrepreneurPulse.com.au  Fact Sheet for Healthcare Providers:  IncredibleEmployment.be  This test is no t yet approved or cleared by the [place] FDA and  has been authorized for detection and/or diagnosis of SARS-CoV-2 by FDA under an Emergency Use Authorization (EUA). This EUA will remain  in effect (meaning this test can be used) for the duration of the COVID-19 declaration under Section 564(b)(1) of the Act, 21 U.S.C.section 360bbb-3(b)(1), unless the authorization is terminated  or revoked sooner.       Influenza A by PCR NEGATIVE NEGATIVE   Influenza B by PCR NEGATIVE NEGATIVE    Comment: (NOTE) The Xpert Xpress SARS-CoV-2/FLU/RSV plus assay is intended as an aid in the diagnosis of influenza from Nasopharyngeal swab specimens and should not be used as a sole basis for treatment. Nasal washings and aspirates are unacceptable for Xpert Xpress SARS-CoV-2/FLU/RSV testing.  Fact Sheet for Patients: EntrepreneurPulse.com.au  Fact Sheet for Healthcare Providers: IncredibleEmployment.be  This test is not yet approved or cleared by the [place] FDA and has been authorized for detection and/or diagnosis of SARS-CoV-2 by FDA under an Emergency Use Authorization (EUA). This EUA will remain in effect (meaning this test can be used) for the duration of the COVID-19 declaration under Section 564(b)(1) of the Act, 21 U.S.C. section 360bbb-3(b)(1), unless the authorization is terminated or revoked.  Performed at [facility], [place] [address], [place], [place] [address]   Urinalysis, Routine w reflex microscopic     Status: Abnormal   Collection Time: 08/05/21 10:46 AM  Result Value Ref Range   Color, Urine YELLOW YELLOW   APPearance TURBID (A) CLEAR    Specific Gravity, Urine 1.018 1.005 - 1.030   pH 5.0 5.0 - 8.0   Glucose, UA NEGATIVE NEGATIVE mg/dL   Hgb urine dipstick MODERATE (A) NEGATIVE   Bilirubin Urine NEGATIVE NEGATIVE   Ketones, ur NEGATIVE NEGATIVE mg/dL   Protein, ur 100 (A) NEGATIVE mg/dL   Nitrite NEGATIVE NEGATIVE   Leukocytes,Ua MODERATE (A) NEGATIVE   RBC / HPF 21-50 0 - 5 RBC/hpf   WBC, UA 21-50 0 - 5 WBC/hpf   Bacteria, UA MANY (A) NONE SEEN   Squamous Epithelial / LPF 0-5 0 - 5   WBC Clumps PRESENT  Mucus PRESENT     Comment: Performed at [facility], [place] [address], [place], [place] [address]  CBG monitoring, ED     Status: Abnormal   Collection Time: 08/05/21 10:48 AM  Result Value Ref Range   Glucose-Capillary 148 (H) 70 - 99 mg/dL    Comment: Glucose reference range applies only to samples taken after fasting for at least 8 hours.  Lactic acid, plasma     Status: Abnormal   Collection Time: 08/05/21 10:59 AM  Result Value Ref Range   Lactic Acid, Venous 2.5 (HH) 0.5 - 1.9 mmol/L    Comment: CRITICAL RESULT CALLED TO, READ BACK BY AND VERIFIED WITH: C,Natasha Paulson RN '@[id number]'$  08/05/21 E,[place] Performed at [facility], [place] [address], [place], [place] [address]   Comprehensive metabolic panel     Status: Abnormal   Collection Time: 08/05/21 11:07 AM  Result Value Ref Range   Sodium 149 (H) 135 - 145 mmol/L   Potassium 5.2 (H) 3.5 - 5.1 mmol/L   Chloride 117 (H) 98 - 111 mmol/L   CO2 23 22 - 32 mmol/L   Glucose, Bld 129 (H) 70 - 99 mg/dL    Comment: Glucose reference range applies only to samples taken after fasting for at least 8 hours.   BUN 31 (H) 8 - 23 mg/dL   Creatinine, Ser 1.54 (H) 0.44 - 1.00 mg/dL   Calcium 8.9 8.9 - 10.3 mg/dL   Total Protein 7.1 6.5 - 8.1 g/dL   Albumin 1.5 (L) 3.5 - 5.0 g/dL   AST 75 (H) 15 - 41 U/L   ALT 21 0 - 44 U/L   Alkaline Phosphatase 223 (H) 38 - 126 U/L   Total Bilirubin 1.2 0.3 - 1.2 mg/dL   GFR, Estimated 38 (L) >60 mL/min    Comment:  (NOTE) Calculated using the CKD-EPI Creatinine Equation (2021)    Anion gap 9 5 - 15    Comment: Performed at [facility], [place] [address], [place], [place] [address]  CBC with Differential     Status: Abnormal   Collection Time: 08/05/21 11:07 AM  Result Value Ref Range   WBC 12.2 (H) 4.0 - 10.5 K/uL   RBC 4.65 3.87 - 5.11 MIL/uL   Hemoglobin 13.7 12.0 - 15.0 g/dL   HCT 44.4 36.0 - 46.0 %   MCV 95.5 80.0 - 100.0 fL   MCH 29.5 26.0 - 34.0 pg   MCHC 30.9 30.0 - 36.0 g/dL   RDW 16.1 (H) 11.5 - 15.5 %   Platelets 179 150 - 400 K/uL    Comment: REPEATED TO VERIFY   nRBC 0.0 0.0 - 0.2 %   Neutrophils Relative % 89 %   Neutro Abs 10.9 (H) 1.7 - 7.7 K/uL   Lymphocytes Relative 6 %   Lymphs Abs 0.7 0.7 - 4.0 K/uL   Monocytes Relative 4 %   Monocytes Absolute 0.5 0.1 - 1.0 K/uL   Eosinophils Relative 0 %   Eosinophils Absolute 0.0 0.0 - 0.5 K/uL   Basophils Relative 0 %   Basophils Absolute 0.0 0.0 - 0.1 K/uL   Immature Granulocytes 1 %   Abs Immature Granulocytes 0.11 (H) 0.00 - 0.07 K/uL    Comment: Performed at [place] [address] [place], [place] [address]  Protime-INR     Status: None   Collection Time: 08/05/21 11:07 AM  Result Value Ref Range   Prothrombin Time 14.1 11.4 - 15.2  seconds   INR 1.1 0.8 - 1.2    Comment: (NOTE) INR goal varies based on device and disease states. Performed at [facility], [place] [address] [place], [place] [address]   APTT     Status: Abnormal   Collection Time: 08/05/21 11:07 AM  Result Value Ref Range   aPTT 45 (H) 24 - 36 seconds    Comment:        IF BASELINE aPTT IS ELEVATED, SUGGEST PATIENT RISK ASSESSMENT BE USED TO DETERMINE APPROPRIATE ANTICOAGULANT THERAPY. Performed at [facility], [place] [address] [place] [address], [place], [place] [address]   I-Stat arterial blood gas, ED     Status: Abnormal   Collection Time: 08/05/21 11:53 AM  Result Value Ref Range   pH, Arterial 7.391 7.35 - 7.45   pCO2 arterial 45.9  32 - 48 mmHg   pO2, Arterial 73 (L) 83 - 108 mmHg   Bicarbonate 27.4 20.0 - 28.0 mmol/L   TCO2 29 22 - 32 mmol/L   O2 Saturation 93 %   Acid-Base Excess 2.0 0.0 - 2.0 mmol/L   Sodium 151 (H) 135 - 145 mmol/L   Potassium 3.9 3.5 - 5.1 mmol/L   Calcium, Ion 1.26 1.15 - 1.40 mmol/L   HCT 34.0 (L) 36.0 - 46.0 %   Hemoglobin 11.6 (L) 12.0 - 15.0 g/dL   Patient temperature 101.4 F    Collection site RADIAL, ALLEN'S TEST ACCEPTABLE    Drawn by RT    Sample type ARTERIAL    CT Angio Chest PE W and/or Wo Contrast  Result Date: 08/05/2021 CLINICAL DATA:  High clinical suspicion for pulmonary embolism EXAM: CT ANGIOGRAPHY CHEST WITH CONTRAST TECHNIQUE: Multidetector CT imaging of the chest was performed using the standard protocol during bolus administration of intravenous contrast. Multiplanar CT image reconstructions and MIPs were obtained to evaluate the vascular anatomy. RADIATION DOSE REDUCTION: This exam was performed according to the departmental dose-optimization program which includes automated exposure control, adjustment of the mA and/or kV according to patient size and/or use of iterative reconstruction technique. CONTRAST:  160m OMNIPAQUE IOHEXOL 350 MG/ML SOLN COMPARISON:  Previous studies including the chest radiographs done on 08/05/2021 and CT chest done on 07/18/2021 FINDINGS: Cardiovascular: There is homogeneous enhancement in the thoracic aorta. There is ectasia of ascending thoracic aorta measuring 3.7 cm. Scattered coronary artery calcifications are seen. Metallic sutures are seen in the sternum suggesting possible previous coronary bypass surgery. Heart is enlarged in size. There are no intraluminal filling defects in the central pulmonary artery branches. Evaluation of small peripheral branches in the lower lung fields is limited by infiltrates and less than optimal contrast enhancement. Mediastinum/Nodes: No significant lymphadenopathy seen. Lungs/Pleura: There are patchy infiltrates  in the posterior aspect of both lower lung fields more so on the right side. Small right pleural effusion is seen. There is no pneumothorax. Upper Abdomen: Unremarkable. Musculoskeletal: Unremarkable. Review of the MIP images confirms the above findings. IMPRESSION: There is no evidence of pulmonary artery embolism. There is no evidence of thoracic aortic dissection. There is ectasia of ascending thoracic aorta measuring 3.7 cm. Coronary artery disease. There are patchy infiltrates in both lower lung fields, more so on the right side suggesting atelectasis/pneumonia. Small right pleural effusion is seen. Electronically Signed   By: PElmer PickerM.D.   On: 08/05/2021 14:48   DG Chest Port 1 View  Result Date: 08/05/2021 CLINICAL DATA:  Fever.  Possible sepsis. EXAM: PORTABLE CHEST 1 VIEW COMPARISON:  Chest  x-ray dated Jul 21, 2021. FINDINGS: Unchanged mild cardiomegaly status post CABG. Normal pulmonary vascularity. Unchanged trace right pleural effusion. No consolidation or pneumothorax. No acute osseous abnormality. IMPRESSION: 1. Unchanged trace right pleural effusion. Electronically Signed   By: Titus Dubin M.D.   On: 08/05/2021 12:07    Pending Labs Unresulted Labs (From admission, onward)     Start     Ordered   08/06/21 0500  CBC  Daily,   R      08/05/21 1107   08/06/21 2951  Basic metabolic panel  Daily,   R      08/05/21 1107   08/05/21 1558  Sedimentation rate  Add-on,   AD        08/05/21 1557   08/05/21 1558  C-reactive protein  Add-on,   AD        08/05/21 1557   08/05/21 1530  CK  Add-on,   AD        08/05/21 1531   08/05/21 1527  MRSA Next Gen by PCR, Nasal  Once,   URGENT        08/05/21 1526   08/05/21 1318  Urine Culture  Once,   URGENT       Question:  Indication  Answer:  Sepsis   08/05/21 1317   08/05/21 1046  Lactic acid, plasma  (Septic presentation on arrival (screening labs, nursing and treatment orders for obvious sepsis))  Now then every 2 hours,   R       08/05/21 1047   08/05/21 1046  Blood Culture (routine x 2)  (Septic presentation on arrival (screening labs, nursing and treatment orders for obvious sepsis))  BLOOD CULTURE X 2,   STAT      08/05/21 1047            Vitals/Pain Today's Vitals   08/05/21 1230 08/05/21 1245 08/05/21 1300 08/05/21 1500  BP: 107/73 117/72 (!) 101/59 132/87  Pulse: 97 98 87 92  Resp: (!) 21 (!) 27 (!) 27 12  Temp:      TempSrc:      SpO2: 94% 92%  95%    Isolation Precautions No active isolations  Medications Medications  ceFEPIme (MAXIPIME) 2 g in sodium chloride 0.9 % 100 mL IVPB (0 g Intravenous Stopped 08/05/21 1151)  vancomycin (VANCOREADY) IVPB 1500 mg/300 mL (has no administration in time range)  acetaminophen (TYLENOL) tablet 650 mg (has no administration in time range)  aspirin EC tablet 81 mg (has no administration in time range)  atorvastatin (LIPITOR) tablet 80 mg (has no administration in time range)  escitalopram (LEXAPRO) tablet 10 mg (has no administration in time range)  pantoprazole (PROTONIX) EC tablet 40 mg (has no administration in time range)  folic acid (FOLVITE) tablet 1 mg (has no administration in time range)  gabapentin (NEURONTIN) capsule 100 mg (has no administration in time range)  feeding supplement (ENSURE ENLIVE / ENSURE PLUS) liquid 237 mL (has no administration in time range)  guaiFENesin (MUCINEX) 12 hr tablet 600 mg (has no administration in time range)  heparin injection 5,000 Units (has no administration in time range)  ondansetron (ZOFRAN) tablet 4 mg (has no administration in time range)    Or  ondansetron (ZOFRAN) injection 4 mg (has no administration in time range)  bisacodyl (DULCOLAX) suppository 10 mg (has no administration in time range)  HYDROmorphone (DILAUDID) injection 0.5-1 mg (has no administration in time range)  0.45 % sodium chloride infusion (has no administration in time range)  vancomycin (VANCOREADY) IVPB 1500 mg/300 mL (0 mg  Intravenous Stopped 08/05/21 1409)  lactated ringers bolus 1,000 mL (0 mLs Intravenous Stopped 08/05/21 1308)  iohexol (OMNIPAQUE) 350 MG/ML injection 100 mL (100 mLs Intravenous Contrast Given 08/05/21 1430)    Mobility manual wheelchair High fall risk   Focused Assessments Neuro Assessment Handoff:         Neuro Assessment:  AOx3, moves all extremities appropriately and still altered but more alert than from when she got here Neuro Checks:      Last Documented NIHSS Modified Score:   Has TPA been given? No If patient is a Neuro Trauma and patient is going to OR before floor call report to [place] nurse: [phone] or [phone]   R Recommendations: See Admitting Provider Note  Report given to:   Additional Notes: 5LNC at this time

## 2021-08-05 NOTE — ED Notes (Signed)
Dr Alvino Chapel in to see

## 2021-08-05 NOTE — ED Triage Notes (Signed)
[facility] bib [facility] from [facility] with complaints of being found altered this am by staff. Pt is normally AOx4 and per staff was at baseline yesterday. Pt presents combative, trying to get out of bed, and only alert to name. Pt oxygen sats found to be 88% on room air at facility, pt arrives to ED with NRB on and pulling it off.  EMS vitals: 98/60, 120HR, 99.4, 88%NRB at 15L

## 2021-08-05 NOTE — Progress Notes (Signed)
Elink following code sepsis °

## 2021-08-05 NOTE — Evaluation (Signed)
Clinical/Bedside Swallow Evaluation Patient Details  Name: [name] [name] MRN: [id number] Date of Birth: 10-01-1960  Today's Date: 08/05/2021 Time: SLP Start Time (ACUTE ONLY): 1656 SLP Stop Time (ACUTE ONLY): 1710 SLP Time Calculation (min) (ACUTE ONLY): 14 min  Past Medical History:  Past Medical History:  Diagnosis Date   Acute ST elevation myocardial infarction (STEMI) involving right coronary artery ([place]) 10/04/2015   x 2   Anxiety    Anxiety and depression    CAD (coronary artery disease)    a. CABG 06/2015: LIMA-LAD, SVG-RCA, SVG-OM2 b. STEMI s/p PCI to distal RCA lesion with a small Promus Premier stent. ( patent LIMA to the LAD, occluded SVG to OM, occluded SVG to RCA.)   CAD (coronary artery disease) of artery bypass graft; occluded SVG to RCA and occluded SVG to OM 10/08/2015   Empty sella (HCC)    GERD (gastroesophageal reflux disease)    Headache    from stress   Hematuria wed 06-13-2020   Hidradenitis axillaris    History of kidney stones    Hyperlipemia    Hypertension    Lupus ([place]) dx'd 2008   "they think I have this; have to get retested" (06/12/2015)   Obesity (BMI 30.0-34.9)    Pneumonia X 2   last time yrs ago   PONV (postoperative nausea and vomiting)    likes iv med or scopolamine patch works well   Psoriasis 06/15/2020   hands elbows, ears, small amount on scalp and bridge of nose   Rheumatoid aortitis    psorriatic arthritis   Rheumatoid arthritis ([place])    S/P angioplasty with stent; 10/04/15 to distal RCA lesion. with Promus premier. 10/08/2015   Scoliosis    Sleep apnea    patient states "it is mild" no cpap needed   Stroke [facility]) March 2014   left MCA branches; "they say I have them all the time", denies residual on 06/12/2015   Tobacco abuse    Type II diabetes mellitus ([place])    Urinary frequency    and urgency   Wears glasses    for reading   Past Surgical History:  Past Surgical History:  Procedure Laterality Date   ABDOMINAL HYSTERECTOMY   31 yrs ago   total has ovaies   BREAST BIOPSY Bilateral yrd ago   benign   CARDIAC CATHETERIZATION N/A 06/13/2015   Procedure: Left Heart Cath and Coronary Angiography;  Surgeon: Peter M Martinique, MD;  Location: [employer] CV LAB;  Service: Cardiovascular;  Laterality: N/A;   CARDIAC CATHETERIZATION N/A 10/04/2015   Procedure: Left Heart Cath and Coronary Angiography;  Surgeon: [name], MD;  Location: [place] CV LAB;  Service: Cardiovascular;  Laterality: N/A;   CARDIAC CATHETERIZATION N/A 10/04/2015   Procedure: Coronary Stent Intervention;  Surgeon: [name], MD;  Location: [place] CV LAB;  Service: Cardiovascular;  Laterality: N/A;   CORONARY ARTERY BYPASS GRAFT N/A 06/19/2015   Procedure: CORONARY ARTERY BYPASS GRAFTING (CABG) TIMES FOUR USING LEFT INTERNAL MAMMARY, RIGHT SAPHENOUS LEG VEIN AND CRYO SAPHENOUS VEIN;  Surgeon: [name], MD;  Location: [place];  Service: Open Heart Surgery;  Laterality: N/A;   CYSTOSCOPY WITH RETROGRADE PYELOGRAM, URETEROSCOPY AND STENT PLACEMENT Left 06/21/2020   Procedure: CYSTOSCOPY WITH RETROGRADE PYELOGRAM, URETEROSCOPY, BASKET STONE EXTRACTION AND  STENT PLACEMENT;  Surgeon: Robley Fries, MD;  [facility];  Service: Urology;  Laterality: Left;   DILATION AND CURETTAGE OF UTERUS  yrs ago  HOLMIUM LASER APPLICATION Left 4/0/3474   Procedure: HOLMIUM LASER APPLICATION;  Surgeon: Robley Fries, MD;  Location: [facility];  Service: Urology;  Laterality: Left;   HYDRADENITIS EXCISION Right 12/20/2019   Procedure: EXCISION OF RIGHT  HIDRADENITIS AXILLARY;  Surgeon: Donnie Mesa, MD;  Location: [place];  Service: General;  Laterality: Right;   IR URETERAL STENT LEFT NEW ACCESS W/O SEP NEPHROSTOMY CATH  01/10/2019   NEPHROLITHOTOMY Left 01/10/2019   Procedure: NEPHROLITHOTOMY PERCUTANEOUS;  Surgeon: Kathie Rhodes, MD;  Location: [facility];  Service: Urology;  Laterality: Left;   TEE  WITHOUT CARDIOVERSION N/A 06/19/2015   Procedure: TRANSESOPHAGEAL ECHOCARDIOGRAM (TEE);  Surgeon: [name], MD;  Location: [place];  Service: Open Heart Surgery;  Laterality: N/A;   TONSILLECTOMY  age 33   TUBAL LIGATION  many yrs ago   HPI:  Patient is a 61 y.o. female who presented to ED from SNF with AMS, hypoxia, fever. Dx sepsis. PMH: FTT, esophageal dysmotility (esophagram March 2023), DM, RA with chronic pain, HTN, HL, CAD s/p CABG. Evaluated by SLP during Feb 2023 admission; primary issue was mentation. She was D/Cd on a dysphagia 3 diet with thin liquids with no concerns for aspiration.  Subsequent admission two weeks ago for pna; pt d/cd back to SNF on pureed diet.  Pt has had choking episodes while eating per chart review.    Assessment / Plan / Recommendation  Clinical Impression  Pt's mentation was the primary barrier to a comprehensive assessment of her swallowing. She opened her eyes to her name being called, moaned and occasionally said "yes" to questions, followed simple commands intermittently. She demonstrated poor recognition and oral awareness with oral holding of purees and water and max verbal/tactile cues needed to initiate a swallow response.  She had difficulty drawing water through a straw and demonstrated oral spillage. For now, recommend maintaining NPO except allowing ice chips after oral care.  SLP will follow for along for readiness for PO diet as mentation improves. SLP Visit Diagnosis: Dysphagia, unspecified (R13.10)    Aspiration Risk    tba   Diet Recommendation    NPO except ice chips       Other  Recommendations Oral Care Recommendations: Oral care prior to ice chip/H20;Oral care QID    Recommendations for follow up therapy are one component of a multi-disciplinary discharge planning process, led by the attending physician.  Recommendations may be updated based on patient status, additional functional criteria and insurance authorization.  Follow up  Recommendations Skilled nursing-short term rehab (<3 hours/day)          Functional Status Assessment Patient has had a recent decline in their functional status and demonstrates the ability to make significant improvements in function in a reasonable and predictable amount of time.  Frequency and Duration min 2x/week  2 weeks       Prognosis Prognosis for Safe Diet Advancement: Guarded Barriers to Reach Goals: Cognitive deficits      Swallow Study   General Date of Onset: 08/05/21 HPI: Patient is a 61 y.o. female who presented to ED from SNF with AMS, hypoxia, fever. Dx sepsis. PMH: FTT, esophageal dysmotility (esophagram March 2023), DM, RA with chronic pain, HTN, HL, CAD s/p CABG. Evaluated by SLP during Feb 2023 admission; primary issue was mentation. She was D/Cd on a dysphagia 3 diet with thin liquids with no concerns for aspiration.  Subsequent admission two weeks ago for pna; pt d/cd back to SNF on pureed diet.  Pt has had choking episodes while eating per chart review. Type of Study: Bedside Swallow Evaluation Previous Swallow Assessment: see HPI Diet Prior to this Study: NPO Temperature Spikes Noted: Yes Respiratory Status: Nasal cannula History of Recent Intubation: No Behavior/Cognition: Lethargic/Drowsy Oral Cavity Assessment: Within Functional Limits Oral Care Completed by SLP: Recent completion by staff Oral Cavity - Dentition: Adequate natural dentition Self-Feeding Abilities: Total assist Patient Positioning: Partially reclined Baseline Vocal Quality: Normal Volitional Cough: Cognitively unable to elicit Volitional Swallow: Unable to elicit    Oral/Motor/Sensory Function Overall Oral Motor/Sensory Function: Within functional limits   Ice Chips Ice chips: Within functional limits   Thin Liquid Thin Liquid: Impaired Presentation: Spoon;Straw Oral Phase Impairments: Poor awareness of bolus Oral Phase Functional Implications: Oral holding Pharyngeal  Phase  Impairments: Suspected delayed Swallow    Nectar Thick Nectar Thick Liquid: Not tested   Honey Thick Honey Thick Liquid: Not tested   Puree Puree: Impaired Presentation: Spoon Oral Phase Impairments: Poor awareness of bolus Oral Phase Functional Implications: Oral holding Pharyngeal Phase Impairments: Suspected delayed Swallow   Solid     Solid: Not tested      Juan Quam Laurice 08/05/2021,5:18 PM  Estill Bamberg L. Tivis Ringer, [place] Office number [phone] Pager [phone]

## 2021-08-05 NOTE — Progress Notes (Signed)
   08/05/21 1832  Vitals  Temp 98.1 F (36.7 C)  Temp Source Oral  BP 124/83  MAP (mmHg) 90  BP Location Right Arm  BP Method Automatic  Patient Position (if appropriate) Lying  Pulse Rate (!) 116  Pulse Rate Source Dinamap  ECG Heart Rate (!) 115  Resp (!) 26  Level of Consciousness  Level of Consciousness Alert  MEWS COLOR  MEWS Score Color Red  Oxygen Therapy  SpO2 97 %  O2 Device Room Air  MEWS Score  MEWS Temp 0  MEWS Systolic 0  MEWS Pulse 2  MEWS RR 2  MEWS LOC 0  MEWS Score 4  Provider Notification  Provider Name/Title Zhang MD  Date Provider Notified 08/05/21  Time Provider Notified 1610  Method of Notification Page  Notification Reason Other (Comment) (red mews)  Provider response No new orders  Date of Provider Response 08/05/21  Time of Provider Response 1842  Rapid Response Notification  Name of Rapid Response RN Notified Helle RN  Date Rapid Response Notified 08/05/21  Time Rapid Response Notified 1920  Note  Observations  (no new orders, will continue to monitor)

## 2021-08-06 ENCOUNTER — Inpatient Hospital Stay (HOSPITAL_COMMUNITY): Payer: Medicare HMO

## 2021-08-06 DIAGNOSIS — I110 Hypertensive heart disease with heart failure: Secondary | ICD-10-CM

## 2021-08-06 DIAGNOSIS — R7881 Bacteremia: Secondary | ICD-10-CM | POA: Diagnosis not present

## 2021-08-06 DIAGNOSIS — R627 Adult failure to thrive: Secondary | ICD-10-CM | POA: Diagnosis not present

## 2021-08-06 DIAGNOSIS — A498 Other bacterial infections of unspecified site: Secondary | ICD-10-CM | POA: Diagnosis not present

## 2021-08-06 DIAGNOSIS — R319 Hematuria, unspecified: Secondary | ICD-10-CM

## 2021-08-06 DIAGNOSIS — E872 Acidosis, unspecified: Secondary | ICD-10-CM

## 2021-08-06 DIAGNOSIS — R7401 Elevation of levels of liver transaminase levels: Secondary | ICD-10-CM

## 2021-08-06 DIAGNOSIS — R6520 Severe sepsis without septic shock: Secondary | ICD-10-CM

## 2021-08-06 DIAGNOSIS — B9620 Unspecified Escherichia coli [E. coli] as the cause of diseases classified elsewhere: Secondary | ICD-10-CM

## 2021-08-06 DIAGNOSIS — L4052 Psoriatic arthritis mutilans: Secondary | ICD-10-CM

## 2021-08-06 DIAGNOSIS — J9601 Acute respiratory failure with hypoxia: Secondary | ICD-10-CM

## 2021-08-06 DIAGNOSIS — D696 Thrombocytopenia, unspecified: Secondary | ICD-10-CM

## 2021-08-06 DIAGNOSIS — G9341 Metabolic encephalopathy: Secondary | ICD-10-CM

## 2021-08-06 DIAGNOSIS — E875 Hyperkalemia: Secondary | ICD-10-CM

## 2021-08-06 DIAGNOSIS — Z1612 Extended spectrum beta lactamase (ESBL) resistance: Secondary | ICD-10-CM

## 2021-08-06 LAB — BASIC METABOLIC PANEL
Anion gap: 5 (ref 5–15)
BUN: 29 mg/dL — ABNORMAL HIGH (ref 8–23)
CO2: 25 mmol/L (ref 22–32)
Calcium: 8.3 mg/dL — ABNORMAL LOW (ref 8.9–10.3)
Chloride: 119 mmol/L — ABNORMAL HIGH (ref 98–111)
Creatinine, Ser: 1.17 mg/dL — ABNORMAL HIGH (ref 0.44–1.00)
GFR, Estimated: 53 mL/min — ABNORMAL LOW (ref >60)
Glucose, Bld: 132 mg/dL — ABNORMAL HIGH (ref 70–99)
Potassium: 3.7 mmol/L (ref 3.5–5.1)
Sodium: 149 mmol/L — ABNORMAL HIGH (ref 135–145)

## 2021-08-06 LAB — CBC
HCT: 38.2 % (ref 36.0–46.0)
Hemoglobin: 12.1 g/dL (ref 12.0–15.0)
MCH: 30.6 pg (ref 26.0–34.0)
MCHC: 31.7 g/dL (ref 30.0–36.0)
MCV: 96.5 fL (ref 80.0–100.0)
Platelets: 140 10*3/uL — ABNORMAL LOW (ref 150–400)
RBC: 3.96 MIL/uL (ref 3.87–5.11)
RDW: 16.2 % — ABNORMAL HIGH (ref 11.5–15.5)
WBC: 15.8 10*3/uL — ABNORMAL HIGH (ref 4.0–10.5)
nRBC: 0 % (ref 0.0–0.2)

## 2021-08-06 LAB — BLOOD CULTURE ID PANEL (REFLEXED) - BCID2

## 2021-08-06 LAB — SEDIMENTATION RATE: Sed Rate: 54 mm/hr — ABNORMAL HIGH (ref 0–22)

## 2021-08-06 LAB — MRSA NEXT GEN BY PCR, NASAL: MRSA by PCR Next Gen: DETECTED — AB

## 2021-08-06 MED ORDER — PANTOPRAZOLE SODIUM 40 MG IV SOLR
40.0000 mg | INTRAVENOUS | Status: DC
  Administered 2021-08-06 – 2021-08-09 (×4): 40 mg via INTRAVENOUS
  Filled 2021-08-06 (×6): qty 10

## 2021-08-06 MED ORDER — CHLORHEXIDINE GLUCONATE CLOTH 2 % EX PADS
6.0000 | MEDICATED_PAD | Freq: Every day | CUTANEOUS | Status: AC
  Administered 2021-08-06 – 2021-08-10 (×5): 6 via TOPICAL

## 2021-08-06 MED ORDER — DEXTROSE 5 % IV SOLN: INTRAVENOUS | Status: DC

## 2021-08-06 MED ORDER — ACETAMINOPHEN 650 MG RE SUPP
650.0000 mg | Freq: Four times a day (QID) | RECTAL | Status: DC | PRN
  Administered 2021-08-23: 650 mg via RECTAL
  Filled 2021-08-06 (×2): qty 1

## 2021-08-06 MED ORDER — MUPIROCIN 2 % EX OINT
1.0000 "application " | TOPICAL_OINTMENT | Freq: Two times a day (BID) | CUTANEOUS | Status: AC
  Administered 2021-08-06 – 2021-08-10 (×10): 1 via NASAL
  Filled 2021-08-06 (×2): qty 22

## 2021-08-06 MED ORDER — SODIUM CHLORIDE 0.9 % IV SOLN
1.0000 g | Freq: Two times a day (BID) | INTRAVENOUS | Status: DC
  Administered 2021-08-06 – 2021-08-07 (×3): 1 g via INTRAVENOUS
  Filled 2021-08-06 (×3): qty 20

## 2021-08-06 NOTE — Progress Notes (Signed)
PHARMACY - PHYSICIAN COMMUNICATION CRITICAL VALUE ALERT - BLOOD CULTURE IDENTIFICATION (BCID)  [name] is an 61 y.o. female who presented to [facility] on 08/05/2021 with a chief complaint of altered mentations hypoxia and fever.   Assessment:  3/4 blood cultures from 5/22 growing E. Coli with CTX-M gene detected  Name of physician (or Provider) Contacted: Clarene Essex, NP  Current antibiotics: cefepime/vancomycin  Changes to prescribed antibiotics recommended:  DC Cefepime; Initiate Meropenem 1g Q12h Recommendations accepted by provider  Results for orders placed or performed during the hospital encounter of 08/05/21  Blood Culture ID Panel (Reflexed) (Collected: 08/05/2021 10:51 AM)  Result Value Ref Range   Enterococcus faecalis NOT DETECTED NOT DETECTED   Enterococcus Faecium NOT DETECTED NOT DETECTED   Listeria monocytogenes NOT DETECTED NOT DETECTED   Staphylococcus species NOT DETECTED NOT DETECTED   Staphylococcus aureus (BCID) NOT DETECTED NOT DETECTED   Staphylococcus epidermidis NOT DETECTED NOT DETECTED   Staphylococcus lugdunensis NOT DETECTED NOT DETECTED   Streptococcus species NOT DETECTED NOT DETECTED   Streptococcus agalactiae NOT DETECTED NOT DETECTED   Streptococcus pneumoniae NOT DETECTED NOT DETECTED   Streptococcus pyogenes NOT DETECTED NOT DETECTED   A.calcoaceticus-baumannii NOT DETECTED NOT DETECTED   Bacteroides fragilis NOT DETECTED NOT DETECTED   Enterobacterales DETECTED (A) NOT DETECTED   Enterobacter cloacae complex NOT DETECTED NOT DETECTED   Escherichia coli DETECTED (A) NOT DETECTED   Klebsiella aerogenes NOT DETECTED NOT DETECTED   Klebsiella oxytoca NOT DETECTED NOT DETECTED   Klebsiella pneumoniae NOT DETECTED NOT DETECTED   Proteus species NOT DETECTED NOT DETECTED   Salmonella species NOT DETECTED NOT DETECTED   Serratia marcescens NOT DETECTED NOT DETECTED   Haemophilus influenzae NOT DETECTED NOT DETECTED   Neisseria meningitidis  NOT DETECTED NOT DETECTED   Pseudomonas aeruginosa NOT DETECTED NOT DETECTED   Stenotrophomonas maltophilia NOT DETECTED NOT DETECTED   Candida albicans NOT DETECTED NOT DETECTED   Candida auris NOT DETECTED NOT DETECTED   Candida glabrata NOT DETECTED NOT DETECTED   Candida krusei NOT DETECTED NOT DETECTED   Candida parapsilosis NOT DETECTED NOT DETECTED   Candida tropicalis NOT DETECTED NOT DETECTED   Cryptococcus neoformans/gattii NOT DETECTED NOT DETECTED   CTX-M ESBL DETECTED (A) NOT DETECTED   Carbapenem resistance IMP NOT DETECTED NOT DETECTED   Carbapenem resistance KPC NOT DETECTED NOT DETECTED   Carbapenem resistance NDM NOT DETECTED NOT DETECTED   Carbapenem resist OXA 48 LIKE NOT DETECTED NOT DETECTED   Carbapenem resistance VIM NOT DETECTED NOT DETECTED    Thank you for allowing pharmacy to be a part of this patient's care.  Ardyth Harps, PharmD Clinical Pharmacist

## 2021-08-06 NOTE — Consult Note (Signed)
[place] Nurse Consult Note: Reason for Consult: Consult requested for right buttock and right hip. Wound type: Stage 2 pressure injury to both sites, pink and dry with darker colored skin surrounding the locations. Pressure Injury POA: Yes Dressing procedure/placement/frequency: Topical treatment orders provided for bedside nurses to perform as follows: Foam dressing to right buttock and right hip, change Q 3 days or PRN soiling. Please re-consult if further assistance is needed.  Thank-you,  Julien Girt MSN, [place], [place], [place], [place]

## 2021-08-06 NOTE — Progress Notes (Signed)
Speech Language Pathology Treatment: Dysphagia  Patient Details Name: [name] [name] MRN: [id number] DOB: 1960/09/03 Today's Date: 08/06/2021 Time: 5638-9373 SLP Time Calculation (min) (ACUTE ONLY): 15 min  Assessment / Plan / Recommendation Clinical Impression  Upon SLP arrival, pt had thick secretions spilling out of the R corner of her mouth and was gurgling on secretions suspected to be sitting in her pharynx and/or larynx. SLP used yankauer and gave cues to cough with increased subglottic pressure. Pt otherwise was mostly moaning when cued to just cough. She coughed up more thick secretions throughout the session after ice chips were provided. At times her voice sounded clearer, but it would quickly resume its wet quality, concerning for inadequate secretion management and airway protection. Additional signs of dysphagia with ice chips include multiple, wet swallows and frequent throat clearing. When cued to cough pt would sometimes start gagging and heaving. Recommend to maintain NPO status except for a few pieces of ice after oral care to try to provide a little moisture while working on clearing secretions. Discussed concern for acute on more chronic issues with RN and MD. May wish to consider palliative care referral given recurrent difficulty with swallowing with underlying esophageal component.    HPI HPI: Patient is a 61 y.o. female who presented to ED from SNF with AMS, hypoxia, fever. Dx sepsis. PMH: FTT, esophageal dysmotility (esophagram March 2023), DM, RA with chronic pain, HTN, HL, CAD s/p CABG. Evaluated by SLP during Feb 2023 admission; primary issue was mentation. She was D/Cd on a dysphagia 3 diet with thin liquids with no concerns for aspiration.  Subsequent admission two weeks ago for pna; pt d/cd back to SNF on pureed diet.  Pt has had choking episodes while eating per chart review.      SLP Plan         Recommendations for follow up therapy are one component of a  multi-disciplinary discharge planning process, led by the attending physician.  Recommendations may be updated based on patient status, additional functional criteria and insurance authorization.    Recommendations  Diet recommendations: NPO (few pieces of ice after oral care) Medication Administration: Via alternative means                Oral Care Recommendations: Oral care prior to ice chip/H20;Oral care QID Follow Up Recommendations: Skilled nursing-short term rehab (<3 hours/day) Assistance recommended at discharge: Frequent or constant Supervision/Assistance SLP Visit Diagnosis: Dysphagia, unspecified (R13.10)           Osie Bond., M.A. [place] Office [phone]  Secure chat preferred   08/06/2021, 9:53 AM

## 2021-08-06 NOTE — Progress Notes (Signed)
Initial Nutrition Assessment  DOCUMENTATION CODES:   Non-severe (moderate) malnutrition in context of chronic illness  INTERVENTION:   Consider Cortrak placement for enteral nutrition supplementation if within goals of care and unable to safely advance PO diet.   Recommend obtain accurate weight as able.   NUTRITION DIAGNOSIS:   Moderate Malnutrition related to chronic illness (dysphagia) as evidenced by mild muscle depletion, moderate muscle depletion, mild fat depletion, moderate fat depletion.  GOAL:   Patient will meet greater than or equal to 90% of their needs  MONITOR:   Skin, Labs, Diet advancement  REASON FOR ASSESSMENT:   Malnutrition Screening Tool    ASSESSMENT:   61 yo female admitted with whole body pain. PMH includes HTN, lupus, hidradenitis axillaris, scoliosis, anxiety, CAD, stroke, STEMI, DM-2, GERD, psoriatic arthritis, FTT, dysphagia.  Patient with ongoing failure to thrive. She has ongoing dysphagia that seems to have worsened since last admission. SLP following. Patient is not currently safe for POs.  She remains NPO d/t chronic esophageal dysphagia.  Patient is not able to provide any nutrition hx.  Labs reviewed. Na 149  Medications reviewed and include Protonix. IVF: D5 at 50 ml/h  Patient meets criteria for moderate malnutrition given mild-moderate depletion of muscle and subcutaneous fat mass.  NUTRITION - FOCUSED PHYSICAL EXAM:  Flowsheet Row Most Recent Value  Orbital Region Moderate depletion  Upper Arm Region Moderate depletion  Thoracic and Lumbar Region Mild depletion  Buccal Region Moderate depletion  Temple Region Moderate depletion  Clavicle Bone Region Moderate depletion  Clavicle and Acromion Bone Region Moderate depletion  Scapular Bone Region Moderate depletion  Dorsal Hand Mild depletion  Patellar Region Mild depletion  Anterior Thigh Region Mild depletion  Posterior Calf Region Mild depletion  Edema (RD Assessment)  Mild  Hair Reviewed  Eyes Reviewed  Mouth Reviewed  Skin Reviewed  Nails Reviewed       Diet Order:   Diet Order             Diet NPO time specified Except for: Ice Chips  Diet effective now                   EDUCATION NEEDS:   No education needs have been identified at this time  Skin:  Skin Assessment: Skin Integrity Issues: Skin Integrity Issues:: Stage II Stage II: R buttocks, R hip  Last BM:  no BM documented  Height:   Ht Readings from Last 1 Encounters:  07/30/21 '5\' 6"'$  (1.676 m)    Weight:   Wt Readings from Last 1 Encounters:  07/30/21 68 kg     BMI:  24.2 (using most recent ht and wt from 07/30/21)  Estimated Nutritional Needs:   Kcal:  1900-2100  Protein:  90-105 gm  Fluid:  >/= 1.9 L    Lucas Mallow RD, LDN, CNSC Please refer to Amion for contact information.

## 2021-08-06 NOTE — Progress Notes (Signed)
Pharmacy Antibiotic Note  [name] [name] is a 61 y.o. female admitted on 08/05/2021 with pneumonia.  Pt recently admitted for pneumonia and treated with levofloxacin 5/7-5/11 (x5d course). Pharmacy has been consulted for meropenem and vancomyin dosing.  Temp 101.4, HR 120, RR 28  Plan: DC cefepime Start meropenem 1g Q12h Continue vancomycin '1500mg'$  IV q24h (eAUC ~502)   > Goal AUC 400-550 F/u MRSA PCR, deescalate vancomycin when able Monitor daily WBC, temp, SCr, and clinical s/sx of infection F/u cultures and MRSA PCR and de-escalate as indicated    Temp (24hrs), Avg:99.7 F (37.6 C), Min:98.1 F (36.7 C), Max:101.4 F (38.6 C)  Recent Labs  Lab 08/05/21 1059 08/05/21 1107 08/05/21 1525 08/06/21 0011  WBC  --  12.2*  --  15.8*  CREATININE  --  1.54*  --  1.17*  LATICACIDVEN 2.5*  --  3.1*  --     Estimated Creatinine Clearance: 46.7 mL/min (A) (by C-G formula based on SCr of 1.17 mg/dL (H)).    Allergies  Allergen Reactions   Apremilast Other (See Comments)    unknown   Latex Itching, Swelling and Rash    Antimicrobials this admission: Cefepime 5/22  Vancomycin 5/22 >>  Meropenem 5/23 >>  Dose adjustments this admission: N/A  Microbiology results: 5/22 BCx: 3 of 4 CTX-M E. coli 5/22 MRSA PCR: ordered   Thank you for allowing pharmacy to be a part of this patient's care.  Ardyth Harps, PharmD Clinical Pharmacist

## 2021-08-06 NOTE — Plan of Care (Addendum)
Pt has slept throughout the night, but easily arousable. Occasional moaning and gurgling sounds on pt's throat noted.. Set up oral suction for pt's wet coughing and unable to spit out secretions,. moderate yellowish amount of thick secretions collected from suctioning. Pt c/o dry mouth and throat, ice chips offered and pt tolerated fairly. Oral care provided. Oxygen on 3L now from 5L.  Problem: Education: Goal: Knowledge of General Education information will improve Description: Including pain rating scale, medication(s)/side effects and non-pharmacologic comfort measures Outcome: Progressing   Problem: Safety: Goal: Ability to remain free from injury will improve Outcome: Progressing   Problem: Skin Integrity: Goal: Risk for impaired skin integrity will decrease Outcome: Progressing

## 2021-08-06 NOTE — Progress Notes (Signed)
PROGRESS NOTE    [name] [name]  HTD:[id number] DOB: 05/11/1960 DOA: 08/05/2021 PCP: Leeroy Cha, MD   Brief Narrative:  HPI per Dr. Wynetta Fines on 08/05/21 [name] [name] is a 61 y.o. female with medical history significant of failure to thrive, dysphagia secondary to moderate esophageal dysmotility (barium swallow March 2023), psoriasis arthritis, CAD status post CABG, DM-2, chronic systolic CHF LVEF 72%, was sent to hospital for evaluation of altered mentations hypoxia and fever.   Patient was recently hospitalized and discharged to nursing home 11 days ago after treatment of supposed empyema and pneumonia.  Patient has established history of failure to thrive and has been losing weight, and according to the record she lost about 13 kg in last 41-month.    Patient unable to provide any history as she is confused and agitated.  All history provided by patient and [name], and patient was discharged on soft diet, last week, her diet was downgraded to pured.  Despite, family observed several times patient had choking episode after eating for food.  This morning, family was contacted about patient had after mentations and hypoxia   ED Course: Patient was found hypoxic initially briefly on 15 L of nonrebreather and titrated down to 5 L, tachycardia hypotensive but responded to IV fluid.  Febrile 101.4   Angiogram negative for PE but patchy infiltrates on bilateral lower lung fields suspicious for pneumonia.  Small right-sided pleural effusion.   Patient was started on vancomycin and cefepime.  Sodium 149, K5.2, creatinine 1.5, WBC 12.2.  **Interim History Patient feels short of breath but oxygen requirement has been weaned from 5 L to 3 L.  Still having difficulty swallowing and managing secretions so she is made n.p.o.  Discussed with daughter about a small bore feeding tube and she may be agreeable for tomorrow.  She is found to have an ESBL E. coli bacteremia and is now on  IV meropenem  Assessment and Plan:  Severe sepsis in the setting of ESBL E. coli bacteremia along with concomitant aspiration pneumonia and possible UTI Acute respiratory failure with hypoxia Lactic acidosis -Evidenced by tachycardia, new onset hypoxia, fever, suspected source is pneumonia plus minus UTI.  With signs of endorgan damage for AKI and acute encephalopathy -History of aspiration and radiology pattern of the pneumonia pneumonia with concern about aspiration pneumonia -Continued current vancomycin and cefepime regimen but antibiotics were escalated to IV meropenem given her ESBL -N.p.o. for now and speech recommending strict n.p.o. for now with few pieces of ice after for oral care to provide moisture while working on clearing secretions -SpO2: 95 % O2 Flow Rate (L/min): 3 L/min -ABG:    Component Value Date/Time   PHART 7.391 08/05/2021 1153   PCO2ART 45.9 08/05/2021 1153   PO2ART 73 (L) 08/05/2021 1153   HCO3 27.4 08/05/2021 1153   TCO2 29 08/05/2021 1153   ACIDBASEDEF 3.0 (H) 04/30/2021 2130   O2SAT 93 08/05/2021 1153    -Continuous pulse oximetry maintain O2 saturation greater 90% and continue supplemental oxygen via nasal cannula wean O2 as tolerated -speech evaluation appreciated -Patient's WBC went from 12.2 is now 15.8 -Lactic acid level went from 2.5 is now 3.1 -Blood cultures x2 showed ESBL E. coli -Calorie count, briefly discussed with son about possible future feeding tube if cannot meet the calorie intake demand or recurrent episode of aspiration pneumonia despite downgrade to pured food. -Spoke with her about a small bore feeding tube and likely can be placed tomorrow given that  the [facility] Team is not here on Tuesdays -Treat infections as above and will need to follow-up further cultures -She will need an ambulatory home O2 screen prior to discharge and repeat chest x-ray in a.m.   Acute on chronic dysphagia -Patient had a formal video swallow evaluation  in March this year showed moderate dysmotility.   -Question of whether patient able to intake enough calories by current nutrition regimen as patient keeps losing weight of 15 kg's January of this year. -Also noticed that the patient was discharged with Diflucan treatment of the fungal infection on esophageal on last admission. -Continue to monitor and trend and may need a GI evaluation while hospitalized   AKI -Severe dehydration and volume contraction -Blood pressure stabilized, change hydration regimen from half-normal saline to  D5W to correct sodium level at 50 mL/hr -Patient's BUN/Cr went from 31/1.54 -> 29/1.17 -Avoid further nephrotoxic medications, contrast dyes, hypotension and dehydration to ensure adequate renal perfusion and renally adjust medications -To monitor and trend renal function carefully and repeat CMP in a.m.   UTI with hematuria -Antibiotic regimen for both pneumonia and UTI as of now. -Urinalysis showed turbid appearance with yellow color urine, moderate hemoglobin, moderate leukocytes, 100 protein, many bacteria, 21-50 RBCs per high-power field, 21-50 WBCs with no urine culture apparently -Blood cultures x2 as above   Hypernatremia -Severe dehydration, calculated free water deficit 2 L -Na+ is 151 -> 149 -Received IV boluses and now on maintenance IVF Hydration -Change hydration to D5W at 50 mL/hr -Continue to Monitor and Trend and Repeat CMP in the AM    Hyperkalemia -Mild, secondary to AKI -Improved with IVF Hydration -Now K+ is 3.7 -Continue to Monitor and Replete as Necessary -Repeat CMP in the AM    Acute metabolic encephalopathy -Secondary sepsis, treat sepsis then reevaluate. -Patient's mental status is improving   HTN -Hold off home BP meds for now. -Continue monitor blood pressures per protocol; last blood pressure reading was 932/35   Chronic Systolic CHF -Severe volume contraction at this point and hypoxia likely from bilateral  pneumonia -Hydration with caution for 12 hours then reevaluate and monitor oxygen status. -Repeat chest x-ray in the a.m. and strict I's and O's and daily weights -Patient is -550 mL since admission -Repeat CXR in the AM    Psoriasis Arthritis -No clear etiology of her esophageal dysmotility.   -Unsure whether this is related to her psoriasis arthritis which appears to be poorly controlled as patient complaining about whole body aching.  Check ESR and CRP and ESR was 54 and CRP is 3.1.  Outpatient follow-up with rheumatology   Thrombocytopenia -In the setting of sepsis -Patient's platelet count went from 179 is now 140 -Continue to monitor for signs and symptoms of bleeding; no overt bleeding noted -Repeat CBC in a.m.  Elevated AST -Mild with a AST of 75 -Not repeated today so we will repeat in the morning -Continue monitor and trend hepatic function panel carefully  DVT prophylaxis: heparin injection 5,000 Units Start: 08/05/21 1545    Code Status: Full Code Family Communication: No family currently at bedside  Disposition Plan:  Level of care: Telemetry Medical Status is: Inpatient Remains inpatient appropriate because: Needs further improvement from her sepsis status and needs to ensure adequate p.o. intake; may need a core track and eventual PEG   Consultants:  None  Procedures:  None  Antimicrobials:  Anti-infectives (From admission, onward)    Start     Dose/Rate Route Frequency Ordered Stop  08/06/21 1200  vancomycin (VANCOREADY) IVPB 1500 mg/300 mL        1,500 mg 150 mL/hr over 120 Minutes Intravenous Every 24 hours 08/05/21 1107     08/06/21 0400  meropenem (MERREM) 1 g in sodium chloride 0.9 % 100 mL IVPB        1 g 200 mL/hr over 30 Minutes Intravenous Every 12 hours 08/06/21 0241     08/05/21 1932  vancomycin variable dose per unstable renal function (pharmacist dosing)         Does not apply See admin instructions 08/05/21 1933     08/05/21 1923   ceFEPIme (MAXIPIME) 2 g in sodium chloride 0.9 % 100 mL IVPB  Status:  Discontinued        2 g 200 mL/hr over 30 Minutes Intravenous Every 12 hours 08/05/21 1923 08/06/21 0241   08/05/21 1100  ceFEPIme (MAXIPIME) 2 g in sodium chloride 0.9 % 100 mL IVPB  Status:  Discontinued        2 g 200 mL/hr over 30 Minutes Intravenous Every 8 hours 08/05/21 1056 08/05/21 1923   08/05/21 1100  vancomycin (VANCOREADY) IVPB 1500 mg/300 mL        1,500 mg 150 mL/hr over 120 Minutes Intravenous  Once 08/05/21 1056 08/05/21 1409       Subjective: And examined at bedside and she is still having some difficulty coughing up her secretions.  States that she is short of breath and does not normally wear oxygen at home.  States that she is doing okay otherwise and denies any chest pain.  Just does not feel well.  No other concerns complaints at this time.  Objective: Vitals:   08/06/21 0235 08/06/21 0319 08/06/21 0632 08/06/21 1237  BP:  136/81 130/79 126/74  Pulse: 73 78 72 86  Resp: _0 Temp: 98.2 F (36.8 C) 98 F (36.7 C) 98 F (36.7 C) 98.2 F (36.8 C)  TempSrc: Axillary   Oral  SpO2: 97% 100% 98% 95%    Intake/Output Summary (Last 24 hours) at 08/06/2021 1330 Last data filed at 08/06/2021 1242 Gross per 24 hour  Intake --  Output 550 ml  Net -550 ml   There were no vitals filed for this visit.  Examination: Physical Exam:  Constitutional: Thin AAF in mild distress appears a little anxious and little dyspneic  Respiratory: Diminished to auscultation bilaterally with coarse breath sounds and some rhonchi and some crackles. no appreciable wheezing. Normal respiratory effort and patient is not tachypenic. No accessory muscle use.  Wearing supplemental oxygen nasal cannula and has a wet sounding cough Cardiovascular: RRR, no murmurs / rubs / gallops. S1 and S2 auscultated.  Minimal extremity anemia Abdomen: Soft, non-tender, non-distended. Bowel sounds positive.  GU:  Deferred. Neurologic: CN 2-12 grossly intact with no focal deficits. Romberg sign and cerebellar reflexes not assessed.  Psychiatric: Normal judgment and insight.  Awake and alert.  Anxious mood  Data Reviewed: I have personally reviewed following labs and imaging studies  CBC: Recent Labs  Lab 08/05/21 1107 08/05/21 1153 08/06/21 0011  WBC 12.2*  --  15.8*  NEUTROABS 10.9*  --   --   HGB 13.7 11.6* 12.1  HCT 44.4 34.0* 38.2  MCV 95.5  --  96.5  PLT 179  --  103*   Basic Metabolic Panel: Recent Labs  Lab 08/05/21 1107 08/05/21 1153 08/06/21 0011  NA 149* 151* 149*  K 5.2* 3.9 3.7  CL 117*  --  119*  CO2 23  --  25  GLUCOSE 129*  --  132*  BUN 31*  --  29*  CREATININE 1.54*  --  1.17*  CALCIUM 8.9  --  8.3*   GFR: Estimated Creatinine Clearance: 46.7 mL/min (A) (by C-G formula based on SCr of 1.17 mg/dL (H)). Liver Function Tests: Recent Labs  Lab 08/05/21 1107  AST 75*  ALT 21  ALKPHOS 223*  BILITOT 1.2  PROT 7.1  ALBUMIN 1.5*   No results for input(s): LIPASE, AMYLASE in the last 168 hours. No results for input(s): AMMONIA in the last 168 hours. Coagulation Profile: Recent Labs  Lab 08/05/21 1107  INR 1.1   Cardiac Enzymes: Recent Labs  Lab 08/05/21 2218  CKTOTAL 65   BNP (last 3 results) No results for input(s): PROBNP in the last 8760 hours. HbA1C: No results for input(s): HGBA1C in the last 72 hours. CBG: Recent Labs  Lab 08/05/21 1048  GLUCAP 148*   Lipid Profile: No results for input(s): CHOL, HDL, LDLCALC, TRIG, CHOLHDL, LDLDIRECT in the last 72 hours. Thyroid Function Tests: No results for input(s): TSH, T4TOTAL, FREET4, T3FREE, THYROIDAB in the last 72 hours. Anemia Panel: No results for input(s): VITAMINB12, FOLATE, FERRITIN, TIBC, IRON, RETICCTPCT in the last 72 hours. Sepsis Labs: Recent Labs  Lab 08/05/21 1059 08/05/21 1525  LATICACIDVEN 2.5* 3.1*    Recent Results (from the past 240 hour(s))  Resp Panel by RT-PCR (Flu  A&B, Covid) Nasopharyngeal Swab     Status: None   Collection Time: 08/05/21 10:46 AM   Specimen: Nasopharyngeal Swab; Nasopharyngeal(NP) swabs in vial transport medium  Result Value Ref Range Status   SARS Coronavirus 2 by RT PCR NEGATIVE NEGATIVE Final    Comment: (NOTE) SARS-CoV-2 target nucleic acids are NOT DETECTED.  The SARS-CoV-2 RNA is generally detectable in upper respiratory specimens during the acute phase of infection. The lowest concentration of SARS-CoV-2 viral copies this assay can detect is 138 copies/mL. A negative result does not preclude SARS-Cov-2 infection and should not be used as the sole basis for treatment or other patient management decisions. A negative result may occur with  improper specimen collection/handling, submission of specimen other than nasopharyngeal swab, presence of viral mutation(s) within the areas targeted by this assay, and inadequate number of viral copies(<138 copies/mL). A negative result must be combined with clinical observations, patient history, and epidemiological information. The expected result is Negative.  Fact Sheet for Patients:  EntrepreneurPulse.com.au  Fact Sheet for Healthcare Providers:  IncredibleEmployment.be  This test is no t yet approved or cleared by the [place] FDA and  has been authorized for detection and/or diagnosis of SARS-CoV-2 by FDA under an Emergency Use Authorization (EUA). This EUA will remain  in effect (meaning this test can be used) for the duration of the COVID-19 declaration under Section 564(b)(1) of the Act, 21 U.S.C.section 360bbb-3(b)(1), unless the authorization is terminated  or revoked sooner.       Influenza A by PCR NEGATIVE NEGATIVE Final   Influenza B by PCR NEGATIVE NEGATIVE Final    Comment: (NOTE) The Xpert Xpress SARS-CoV-2/FLU/RSV plus assay is intended as an aid in the diagnosis of influenza from Nasopharyngeal swab specimens  and should not be used as a sole basis for treatment. Nasal washings and aspirates are unacceptable for Xpert Xpress SARS-CoV-2/FLU/RSV testing.  Fact Sheet for Patients: EntrepreneurPulse.com.au  Fact Sheet for Healthcare Providers: IncredibleEmployment.be  This test is not yet approved or cleared by the [place] FDA  and has been authorized for detection and/or diagnosis of SARS-CoV-2 by FDA under an Emergency Use Authorization (EUA). This EUA will remain in effect (meaning this test can be used) for the duration of the COVID-19 declaration under Section 564(b)(1) of the Act, 21 U.S.C. section 360bbb-3(b)(1), unless the authorization is terminated or revoked.  Performed at [facility], [place] [address] [place], [place] [address]   Blood Culture (routine x 2)     Status: None (Preliminary result)   Collection Time: 08/05/21 10:46 AM   Specimen: BLOOD  Result Value Ref Range Status   Specimen Description BLOOD BLOOD RIGHT WRIST  Final   Special Requests   Final    BOTTLES DRAWN AEROBIC AND ANAEROBIC Blood Culture adequate volume   Culture  Setup Time   Final    GRAM NEGATIVE RODS IN BOTH AEROBIC AND ANAEROBIC BOTTLES CRITICAL VALUE NOTED.  VALUE IS CONSISTENT WITH PREVIOUSLY REPORTED AND CALLED VALUE. Performed at [facility], [place] [address] [place], [place] [address]    Culture GRAM NEGATIVE RODS  Final   Report Status PENDING  Incomplete  Blood Culture (routine x 2)     Status: Abnormal (Preliminary result)   Collection Time: 08/05/21 10:51 AM   Specimen: BLOOD  Result Value Ref Range Status   Specimen Description BLOOD BLOOD LEFT WRIST  Final   Special Requests   Final    BOTTLES DRAWN AEROBIC AND ANAEROBIC Blood Culture adequate volume   Culture  Setup Time   Final    GRAM NEGATIVE RODS ANAEROBIC BOTTLE ONLY CRITICAL RESULT CALLED TO, READ BACK BY AND VERIFIED WITH: PHARMD ADRIENNE WELLBORN 08/06/21_0 :41 BY TW IN  BOTH AEROBIC AND ANAEROBIC BOTTLES    Culture (A)  Final    ESCHERICHIA COLI CULTURE REINCUBATED FOR BETTER GROWTH Performed at [facility], [place] [address], [place], [place] [address]    Report Status PENDING  Incomplete  Blood Culture ID Panel (Reflexed)     Status: Abnormal   Collection Time: 08/05/21 10:51 AM  Result Value Ref Range Status   Enterococcus faecalis NOT DETECTED NOT DETECTED Final   Enterococcus Faecium NOT DETECTED NOT DETECTED Final   Listeria monocytogenes NOT DETECTED NOT DETECTED Final   Staphylococcus species NOT DETECTED NOT DETECTED Final   Staphylococcus aureus (BCID) NOT DETECTED NOT DETECTED Final   Staphylococcus epidermidis NOT DETECTED NOT DETECTED Final   Staphylococcus lugdunensis NOT DETECTED NOT DETECTED Final   Streptococcus species NOT DETECTED NOT DETECTED Final   Streptococcus agalactiae NOT DETECTED NOT DETECTED Final   Streptococcus pneumoniae NOT DETECTED NOT DETECTED Final   Streptococcus pyogenes NOT DETECTED NOT DETECTED Final   A.calcoaceticus-baumannii NOT DETECTED NOT DETECTED Final   Bacteroides fragilis NOT DETECTED NOT DETECTED Final   Enterobacterales DETECTED (A) NOT DETECTED Final    Comment: Enterobacterales represent a large order of gram negative bacteria, not a single organism. CRITICAL RESULT CALLED TO, READ BACK BY AND VERIFIED WITH: PHARMD ADRIENNE WELLBORN 08/06/21_1 :41 BY TW    Enterobacter cloacae complex NOT DETECTED NOT DETECTED Final   Escherichia coli DETECTED (A) NOT DETECTED Final    Comment: CRITICAL RESULT CALLED TO, READ BACK BY AND VERIFIED WITH: PHARMD ADRIENNE WELLBORN 08/06/21_2 :41 BY TW    Klebsiella aerogenes NOT DETECTED NOT DETECTED Final   Klebsiella oxytoca NOT DETECTED NOT DETECTED Final   Klebsiella pneumoniae NOT DETECTED NOT DETECTED Final   Proteus species NOT DETECTED NOT DETECTED Final   Salmonella species NOT DETECTED NOT DETECTED Final  Serratia marcescens NOT DETECTED NOT DETECTED  Final   Haemophilus influenzae NOT DETECTED NOT DETECTED Final   Neisseria meningitidis NOT DETECTED NOT DETECTED Final   Pseudomonas aeruginosa NOT DETECTED NOT DETECTED Final   Stenotrophomonas maltophilia NOT DETECTED NOT DETECTED Final   Candida albicans NOT DETECTED NOT DETECTED Final   Candida auris NOT DETECTED NOT DETECTED Final   Candida glabrata NOT DETECTED NOT DETECTED Final   Candida krusei NOT DETECTED NOT DETECTED Final   Candida parapsilosis NOT DETECTED NOT DETECTED Final   Candida tropicalis NOT DETECTED NOT DETECTED Final   Cryptococcus neoformans/gattii NOT DETECTED NOT DETECTED Final   CTX-M ESBL DETECTED (A) NOT DETECTED Final    Comment: CRITICAL RESULT CALLED TO, READ BACK BY AND VERIFIED WITH: PHARMD ADRIENNE WELLBORN 08/06/21_0 :41 BY TW (NOTE) Extended spectrum beta-lactamase detected. Recommend a carbapenem as initial therapy.      Carbapenem resistance IMP NOT DETECTED NOT DETECTED Final   Carbapenem resistance KPC NOT DETECTED NOT DETECTED Final   Carbapenem resistance NDM NOT DETECTED NOT DETECTED Final   Carbapenem resist OXA 48 LIKE NOT DETECTED NOT DETECTED Final   Carbapenem resistance VIM NOT DETECTED NOT DETECTED Final    Comment: Performed at [facility], [place] [address] [place], [place] [address]  MRSA Next Gen by PCR, Nasal     Status: Abnormal   Collection Time: 08/06/21  3:00 AM   Specimen: Nasal Mucosa; Nasal Swab  Result Value Ref Range Status   MRSA by PCR Next Gen DETECTED (A) NOT DETECTED Final    Comment: RESULT CALLED TO, READ BACK BY AND VERIFIED WITH: RN Heloise Ochoa 08/06/21_1 :41 BY TW (NOTE) The GeneXpert MRSA Assay (FDA approved for NASAL specimens only), is one component of a comprehensive MRSA colonization surveillance program. It is not intended to diagnose MRSA infection nor to guide or monitor treatment for MRSA infections. Test performance is not FDA approved in patients less than 55 years old. Performed at [facility], [place] [address], [place], [place] [address]     Radiology Studies: CT Angio Chest PE W and/or Wo Contrast  Result Date: 08/05/2021 CLINICAL DATA:  High clinical suspicion for pulmonary embolism EXAM: CT ANGIOGRAPHY CHEST WITH CONTRAST TECHNIQUE: Multidetector CT imaging of the chest was performed using the standard protocol during bolus administration of intravenous contrast. Multiplanar CT image reconstructions and MIPs were obtained to evaluate the vascular anatomy. RADIATION DOSE REDUCTION: This exam was performed according to the departmental dose-optimization program which includes automated exposure control, adjustment of the mA and/or kV according to patient size and/or use of iterative reconstruction technique. CONTRAST:  146m OMNIPAQUE IOHEXOL 350 MG/ML SOLN COMPARISON:  Previous studies including the chest radiographs done on 08/05/2021 and CT chest done on 07/18/2021 FINDINGS: Cardiovascular: There is homogeneous enhancement in the thoracic aorta. There is ectasia of ascending thoracic aorta measuring 3.7 cm. Scattered coronary artery calcifications are seen. Metallic sutures are seen in the sternum suggesting possible previous coronary bypass surgery. Heart is enlarged in size. There are no intraluminal filling defects in the central pulmonary artery branches. Evaluation of small peripheral branches in the lower lung fields is limited by infiltrates and less than optimal contrast enhancement. Mediastinum/Nodes: No significant lymphadenopathy seen. Lungs/Pleura: There are patchy infiltrates in the posterior aspect of both lower lung fields more so on the right side. Small right pleural effusion is seen. There is no pneumothorax. Upper Abdomen: Unremarkable. Musculoskeletal: Unremarkable. Review of the MIP images confirms the above findings. IMPRESSION: There  is no evidence of pulmonary artery embolism. There is no evidence of thoracic aortic dissection. There is ectasia of ascending  thoracic aorta measuring 3.7 cm. Coronary artery disease. There are patchy infiltrates in both lower lung fields, more so on the right side suggesting atelectasis/pneumonia. Small right pleural effusion is seen. Electronically Signed   By: Elmer Picker M.D.   On: 08/05/2021 14:48   DG CHEST PORT 1 VIEW  Result Date: 08/06/2021 CLINICAL DATA:  Pneumonia EXAM: PORTABLE CHEST 1 VIEW COMPARISON:  Chest x-ray dated Aug 05, 2021 FINDINGS: Cardiac and mediastinal contours are unchanged post median sternotomy and CABG. Small right pleural effusion and bibasilar opacities which are likely due to atelectasis. No evidence of pneumothorax. IMPRESSION: Small right pleural effusion and bibasilar opacities which are likely due to atelectasis. Electronically Signed   By: Yetta Glassman M.D.   On: 08/06/2021 08:18   DG Chest Port 1 View  Result Date: 08/05/2021 CLINICAL DATA:  Fever.  Possible sepsis. EXAM: PORTABLE CHEST 1 VIEW COMPARISON:  Chest x-ray dated Jul 21, 2021. FINDINGS: Unchanged mild cardiomegaly status post CABG. Normal pulmonary vascularity. Unchanged trace right pleural effusion. No consolidation or pneumothorax. No acute osseous abnormality. IMPRESSION: 1. Unchanged trace right pleural effusion. Electronically Signed   By: Titus Dubin M.D.   On: 08/05/2021 12:07     Scheduled Meds:  Chlorhexidine Gluconate Cloth  6 each Topical Q0600   feeding supplement  237 mL Oral TID BM   heparin  5,000 Units Subcutaneous Q8H   mupirocin ointment  1 application. Nasal BID   pantoprazole (PROTONIX) IV  40 mg Intravenous Q24H   vancomycin variable dose per unstable renal function (pharmacist dosing)   Does not apply See admin instructions   Continuous Infusions:  dextrose 50 mL/hr at 08/06/21 1037   meropenem (MERREM) IV 1 g (08/06/21 0333)   vancomycin 1,500 mg (08/06/21 1230)    LOS: 1 day   Raiford Noble, DO [facility] Available via Epic secure chat 7am-7pm After these hours,  please refer to coverage provider listed on [email or web address] 08/06/2021, 1:30 PM

## 2021-08-07 ENCOUNTER — Inpatient Hospital Stay (HOSPITAL_COMMUNITY): Payer: Medicare HMO

## 2021-08-07 DIAGNOSIS — R4182 Altered mental status, unspecified: Secondary | ICD-10-CM

## 2021-08-07 DIAGNOSIS — N390 Urinary tract infection, site not specified: Secondary | ICD-10-CM | POA: Diagnosis not present

## 2021-08-07 DIAGNOSIS — A419 Sepsis, unspecified organism: Secondary | ICD-10-CM | POA: Diagnosis not present

## 2021-08-07 DIAGNOSIS — R627 Adult failure to thrive: Secondary | ICD-10-CM | POA: Diagnosis not present

## 2021-08-07 DIAGNOSIS — Z1612 Extended spectrum beta lactamase (ESBL) resistance: Secondary | ICD-10-CM

## 2021-08-07 DIAGNOSIS — N179 Acute kidney failure, unspecified: Secondary | ICD-10-CM | POA: Diagnosis not present

## 2021-08-07 DIAGNOSIS — R0902 Hypoxemia: Secondary | ICD-10-CM | POA: Diagnosis not present

## 2021-08-07 DIAGNOSIS — M069 Rheumatoid arthritis, unspecified: Secondary | ICD-10-CM

## 2021-08-07 DIAGNOSIS — I5022 Chronic systolic (congestive) heart failure: Secondary | ICD-10-CM

## 2021-08-07 DIAGNOSIS — J690 Pneumonitis due to inhalation of food and vomit: Secondary | ICD-10-CM

## 2021-08-07 DIAGNOSIS — L8990 Pressure ulcer of unspecified site, unspecified stage: Secondary | ICD-10-CM

## 2021-08-07 DIAGNOSIS — E440 Moderate protein-calorie malnutrition: Secondary | ICD-10-CM

## 2021-08-07 DIAGNOSIS — K219 Gastro-esophageal reflux disease without esophagitis: Secondary | ICD-10-CM

## 2021-08-07 DIAGNOSIS — A498 Other bacterial infections of unspecified site: Secondary | ICD-10-CM

## 2021-08-07 DIAGNOSIS — R1312 Dysphagia, oropharyngeal phase: Secondary | ICD-10-CM

## 2021-08-07 DIAGNOSIS — E870 Hyperosmolality and hypernatremia: Secondary | ICD-10-CM

## 2021-08-07 DIAGNOSIS — R7881 Bacteremia: Secondary | ICD-10-CM

## 2021-08-07 LAB — CBC WITH DIFFERENTIAL/PLATELET
Abs Immature Granulocytes: 0.11 10*3/uL — ABNORMAL HIGH (ref 0.00–0.07)
Basophils Absolute: 0 10*3/uL (ref 0.0–0.1)
Basophils Relative: 1 %
Eosinophils Absolute: 0 10*3/uL (ref 0.0–0.5)
Eosinophils Relative: 1 %
HCT: 43.3 % (ref 36.0–46.0)
Hemoglobin: 13.2 g/dL (ref 12.0–15.0)
Immature Granulocytes: 2 %
Lymphocytes Relative: 7 %
Lymphs Abs: 0.5 10*3/uL — ABNORMAL LOW (ref 0.7–4.0)
MCH: 29.7 pg (ref 26.0–34.0)
MCHC: 30.5 g/dL (ref 30.0–36.0)
MCV: 97.3 fL (ref 80.0–100.0)
Monocytes Absolute: 0.4 10*3/uL (ref 0.1–1.0)
Monocytes Relative: 6 %
Neutro Abs: 5.6 10*3/uL (ref 1.7–7.7)
Neutrophils Relative %: 83 %
Platelets: 163 10*3/uL (ref 150–400)
RBC: 4.45 MIL/uL (ref 3.87–5.11)
RDW: 16.7 % — ABNORMAL HIGH (ref 11.5–15.5)
WBC: 6.6 10*3/uL (ref 4.0–10.5)
nRBC: 0 % (ref 0.0–0.2)

## 2021-08-07 LAB — MAGNESIUM
Magnesium: 2.2 mg/dL (ref 1.7–2.4)
Magnesium: 1.9 mg/dL (ref 1.7–2.4)

## 2021-08-07 LAB — COMPREHENSIVE METABOLIC PANEL
ALT: 17 U/L (ref 0–44)
AST: 50 U/L — ABNORMAL HIGH (ref 15–41)
Albumin: <1.5 g/dL — ABNORMAL LOW (ref 3.5–5.0)
Alkaline Phosphatase: 169 U/L — ABNORMAL HIGH (ref 38–126)
Anion gap: 6 (ref 5–15)
BUN: 21 mg/dL (ref 8–23)
CO2: 26 mmol/L (ref 22–32)
Calcium: 8.4 mg/dL — ABNORMAL LOW (ref 8.9–10.3)
Chloride: 117 mmol/L — ABNORMAL HIGH (ref 98–111)
Creatinine, Ser: 0.91 mg/dL (ref 0.44–1.00)
GFR, Estimated: >60 mL/min (ref >60)
Glucose, Bld: 63 mg/dL — ABNORMAL LOW (ref 70–99)
Potassium: 3.8 mmol/L (ref 3.5–5.1)
Sodium: 149 mmol/L — ABNORMAL HIGH (ref 135–145)
Total Bilirubin: 0.3 mg/dL (ref 0.3–1.2)
Total Protein: 6.6 g/dL (ref 6.5–8.1)

## 2021-08-07 LAB — GLUCOSE, CAPILLARY
Glucose-Capillary: 126 mg/dL — ABNORMAL HIGH (ref 70–99)
Glucose-Capillary: 51 mg/dL — ABNORMAL LOW (ref 70–99)
Glucose-Capillary: 75 mg/dL (ref 70–99)
Glucose-Capillary: 98 mg/dL (ref 70–99)

## 2021-08-07 LAB — PHOSPHORUS
Phosphorus: 2.5 mg/dL (ref 2.5–4.6)
Phosphorus: 1.8 mg/dL — ABNORMAL LOW (ref 2.5–4.6)

## 2021-08-07 MED ORDER — OSMOLITE 1.5 CAL PO LIQD
1000.0000 mL | ORAL | Status: DC
  Administered 2021-08-07: 1000 mL
  Filled 2021-08-07: qty 1000

## 2021-08-07 MED ORDER — METOPROLOL TARTRATE 5 MG/5ML IV SOLN
2.5000 mg | Freq: Three times a day (TID) | INTRAVENOUS | Status: DC
  Administered 2021-08-07 – 2021-08-10 (×9): 2.5 mg via INTRAVENOUS
  Filled 2021-08-07 (×7): qty 5

## 2021-08-07 MED ORDER — ALBUMIN HUMAN 25 % IV SOLN
25.0000 g | Freq: Four times a day (QID) | INTRAVENOUS | Status: AC
  Administered 2021-08-07 – 2021-08-08 (×4): 25 g via INTRAVENOUS
  Filled 2021-08-07 (×4): qty 100

## 2021-08-07 MED ORDER — DEXTROSE 50 % IV SOLN
INTRAVENOUS | Status: AC
  Administered 2021-08-07: 50 mL
  Filled 2021-08-07: qty 50

## 2021-08-07 MED ORDER — SODIUM CHLORIDE 0.9 % IV SOLN
1.0000 g | Freq: Three times a day (TID) | INTRAVENOUS | Status: AC
  Administered 2021-08-07 – 2021-08-14 (×20): 1 g via INTRAVENOUS
  Filled 2021-08-07 (×23): qty 20

## 2021-08-07 NOTE — Progress Notes (Signed)
Speech Language Pathology Treatment: Dysphagia  Patient Details Name: [name] [name] MRN: [id number] DOB: 1960/07/01 Today's Date: 08/07/2021 Time: 1645-1700 SLP Time Calculation (min) (ACUTE ONLY): 15 min  Assessment / Plan / Recommendation Clinical Impression  Patient seen by SLP for skilled treatment session focused on dysphagia goals. Patient was awake, alert, lying in bed when SLP arrived. She was somewhat fatigued but able to talk to SLP and participate. She frequently had wet/gurgled cough and each time was able to bring up some secretions into back of oral cavity or on top of tongue. SLP provided assistance with oral suctioning to remove thick, whitish secretions. After several instances of productive coughing SLP asked patient if she wanted to try any sips of water. She politely declined at this time and feels she needs to rest. SLP told patient he or one of the other SLP's could see her tomorrow and she was in agreement.   s  HPI HPI: Patient is a 61 y.o. female who presented to ED from SNF with AMS, hypoxia, fever. Dx sepsis. PMH: FTT, esophageal dysmotility (esophagram March 2023), DM, RA with chronic pain, HTN, HL, CAD s/p CABG. Evaluated by SLP during Feb 2023 admission; primary issue was mentation. She was D/Cd on a dysphagia 3 diet with thin liquids with no concerns for aspiration.  Subsequent admission two weeks ago for pna; pt d/cd back to SNF on pureed diet.  Pt has had choking episodes while eating per chart review.      SLP Plan  Continue with current plan of care      Recommendations for follow up therapy are one component of a multi-disciplinary discharge planning process, led by the attending physician.  Recommendations may be updated based on patient status, additional functional criteria and insurance authorization.    Recommendations  Diet recommendations: NPO Medication Administration: Via alternative means                Oral Care Recommendations: Oral  care prior to ice chip/H20;Oral care QID Follow Up Recommendations: Skilled nursing-short term rehab (<3 hours/day) Assistance recommended at discharge: Frequent or constant Supervision/Assistance SLP Visit Diagnosis: Dysphagia, unspecified (R13.10);Dysphagia, oral phase (R13.11) Plan: Continue with current plan of care          Sonia Baller, MA, CCC-SLP Speech Therapy

## 2021-08-07 NOTE — Progress Notes (Signed)
 Brief Nutrition Note  Consult received for enteral/tube feeding initiation and management.  Adult Enteral Nutrition Protocol initiated. Full assessment to follow.  Pt is at very high risk for feeding; given this plan to start TF only at trickle tonight and plan to order refeeding labs (phosphorus, magnesium and potassium)  Cortrak tube in place with tip located in stomach per xray imaging.   Admitting Dx: Hypoxia [R09.02] Sepsis ([place]) [A41.9] Urinary tract infection without hematuria, site unspecified [N39.0] Sepsis, due to unspecified organism, unspecified whether acute organ dysfunction present [facility]) [A41.9]  Labs:  Recent Labs  Lab 08/05/21 1107 08/05/21 1153 08/06/21 0011 08/07/21 0409  NA 149* 151* 149* 149*  K 5.2* 3.9 3.7 3.8  CL 117*  --  119* 117*  CO2 23  --  25 26  BUN 31*  --  29* 21  CREATININE 1.54*  --  1.17* 0.91  CALCIUM 8.9  --  8.3* 8.4*  MG  --   --   --  2.2  PHOS  --   --   --  2.5  GLUCOSE 129*  --  132* 63*     Monifah Freehling MS, RDN, LDN, CNSC Registered Dietitian III Clinical Nutrition RD Pager and On-Call Pager Number Located in [place]

## 2021-08-07 NOTE — Progress Notes (Signed)
PROGRESS NOTE    [name] [name]  NGE:[id number] DOB: [birth year] DOA: 08/05/2021 PCP: Leeroy Cha, MD    Chief Complaint  Patient presents with   Altered Mental Status    Brief Narrative:  HPI per Dr. Wynetta Fines on 08/05/21 [name] [name] is a 61 y.o. female with medical history significant of failure to thrive, dysphagia secondary to moderate esophageal dysmotility (barium swallow March 2023), psoriasis arthritis, CAD status post CABG, DM-2, chronic systolic CHF LVEF 40%, was sent to hospital for evaluation of altered mentations hypoxia and fever.   Patient was recently hospitalized and discharged to nursing home 11 days ago after treatment of supposed empyema and pneumonia.  Patient has established history of failure to thrive and has been losing weight, and according to the record she lost about 13 kg in last 18-months.    Patient unable to provide any history as she is confused and agitated.  All history provided by patient and Elenore Rota [phone]), and patient was discharged on soft diet, last week, her diet was downgraded to pured.  Despite, family observed several times patient had choking episode after eating for food.  This morning, family was contacted about patient had after mentations and hypoxia   ED Course: Patient was found hypoxic initially briefly on 15 L of nonrebreather and titrated down to 5 L, tachycardia hypotensive but responded to IV fluid.  Febrile 101.4   Angiogram negative for PE but patchy infiltrates on bilateral lower lung fields suspicious for pneumonia.  Small right-sided pleural effusion.   Patient was started on vancomycin and cefepime.  Sodium 149, K5.2, creatinine 1.5, WBC 12.2.   **Interim History Patient feels short of breath but oxygen requirement has been weaned from 5 L to 3 L.  Still having difficulty swallowing and managing secretions so she is made n.p.o.  Discussed with daughter about a small bore feeding tube and she may be agreeable  for tomorrow.  She is found to have an ESBL E. coli bacteremia and is now on IV meropenem     Assessment & Plan:   Principal Problem:   Sepsis ([place]) Active Problems:   Rheumatoid arthritis ([place])   Gastroesophageal reflux disease without esophagitis   AMS (altered mental status)   Hypernatremia   Pressure injury of skin   Malnutrition of moderate degree   AKI (acute kidney injury) ([place])   Adult failure to thrive syndrome   Dysphagia   Chronic systolic CHF (congestive heart failure) ([facility])   Infection due to ESBL-producing Escherichia coli  #1 severe sepsis secondary to ESBL E. coli bacteremia, aspiration pneumonia, possible UTI/acute respiratory failure with hypoxia/lactic acidosis. -Patient noted to have met criteria for sepsis with tachycardia, new onset hypoxia, fever, suspected source noted to be pneumonia, bacteremia UTI.  Patient noted with signs of endorgan damage of AKI and acute encephalopathy. -Improving clinically. -Patient with history of aspiration and radiologic pattern of pneumonia with concern for aspiration pneumonia. -MRSA PCR positive. -Patient was on IV vancomycin, IV cefepime and IV cefepime changed to IV Merrem due to ESBL E. coli. -Patient seen by speech therapy and made n.p.o. due to increased risk of aspiration and difficulty with clearing secretions. -Leukocytosis trending down. -Procalcitonin noted to be elevated but not repeated this morning. -Blood cultures done positive for ESBL E. coli. -Patient with poor nutritional status, currently n.p.o., likely needs feeding tube in the future patient cannot meet calorie intake treatment with recurrent episodes of aspiration despite being placed on a dysphagia 1 diet. -Feeding tube  ordered. -Patient seen in consultation by ID and IV vancomycin has been discontinued and recommending continuation of Merrem.  2.  Failure to thrive/dysphagia/severe protein calorie malnutrition -Patient noted to be cachectic, with some  temporal wasting, failure to thrive. -Patient with esophageal dysmotility. -Patient noted to have had a swallow evaluation in March noting moderate dysmotility concern for inadequate nutrition as albumin noted to < 1.5. -Cortrack ordered this morning. -Consult with dietitian. -Place on IV albumin every 6 hours x24 hours. -Status post course of fluconazole for Candida esophagitis during last hospitalization. -Palliative care consultation for goals of care.  3.  Acute kidney injury -Likely secondary to prerenal azotemia in the setting of severe dehydration, volume contraction. -Patient noted to have been on half-normal saline which was changed to D5W. -Renal function improved with hydration. -Avoid nephrotoxins. -Continue D5W. -Follow-up.  4.  Hypernatremia -Secondary to dehydration, failure to thrive, volume depletion.  -Increase D5W to 100 cc an hour. -Follow.  5.  UTI with hematuria -Is noted to be turbid, moderate leukocytes, nitrite negative, 21-50 WBCs, WBC clumps noted, many bacteria. -Urine cultures sent 08/05/2021 with results still pending. -On IV Merrem.  6.  Hyperkalemia -Likely secondary to acute kidney injury. -Improved with hydration. -Resolved.  7.  Acute metabolic encephalopathy -Likely secondary to sepsis/acute infection. -Improving clinically. -Continue IV antibiotics. -Follow.  8.  Hypertension -Patient currently n.p.o. with esophageal dysmotility. -Placed on Lopressor 2.5 mg IV every 8 hours.  9.  Chronic systolic CHF -Patient dehydrated, volume contracted and also with hypoxia from pneumonia. -Continue gentle hydration with D5W. -No signs of acute decompensation. -Follow.  10.  Psoriasis arthritis -No clear etiology of esophageal dysmotility, unsure whether related to psoriatic arthritis which is likely poorly controlled. -We will need outpatient follow-up with rheumatology.  11.  Thrombocytopenia -In the setting of sepsis. -Improved.  12.   GERD -IV PPI.  13.  Pressure injury, POA Pressure Injury 08/05/21 Buttocks Right Stage 2 -  Partial thickness loss of dermis presenting as a shallow open injury with a red, pink wound bed without slough. red, no drainage (Active)  08/05/21 1835  Location: Buttocks  Location Orientation: Right  Staging: Stage 2 -  Partial thickness loss of dermis presenting as a shallow open injury with a red, pink wound bed without slough.  Wound Description (Comments): red, no drainage  Present on Admission: Yes     Pressure Injury 08/05/21 Hip Right Stage 2 -  Partial thickness loss of dermis presenting as a shallow open injury with a red, pink wound bed without slough. (Active)  08/05/21 1837  Location: Hip  Location Orientation: Right  Staging: Stage 2 -  Partial thickness loss of dermis presenting as a shallow open injury with a red, pink wound bed without slough.  Wound Description (Comments):   Present on Admission: Yes         DVT prophylaxis: Heparin Code Status: Full Family Communication: Updated patient.  No family at bedside. Disposition: Likely back to SNF.  Status is: Inpatient Remains inpatient appropriate because: Severity of illness.   Consultants:  ID: Dr. Earlene Plater 08/07/2021 WOC Palliative care pending  Procedures:  CT angiogram chest 08/05/2021 Chest x-ray 08/05/2021, 08/06/2021 Abdominal films 08/07/2021 Cortrack placement pending 08/07/2021   Antimicrobials:  IV cefepime 08/05/2021>>>> 08/06/2021 IV Merrem 08/06/2021>>>> IV vancomycin 08/05/2021>>>> 08/07/2021   Subjective: Patient alert and oriented to self and place.  Still with difficulty with swallowing currently NPO.  Denies any chest pain.  No shortness of breath.  Objective: Vitals:  08/07/21 0005 08/07/21 0436 08/07/21 0739 08/07/21 1500  BP: 136/85 (!) 150/76 123/82 133/71  Pulse:  96 (!) 104 76  Resp: $Remo'20 20 18 'qSFdK$ (!) 21  Temp:  98 F (36.7 C) 98 F (36.7 C) 98.8 F (37.1 C)  TempSrc:  Axillary Oral  Oral  SpO2: 97% 95% 95% 97%    Intake/Output Summary (Last 24 hours) at 08/07/2021 1744 Last data filed at 08/07/2021 1600 Gross per 24 hour  Intake 1838.41 ml  Output 1350 ml  Net 488.41 ml   There were no vitals filed for this visit.  Examination:  General exam: Appears calm and comfortable.  Frail.  Cachectic. Respiratory system: Clear to auscultation anterior lung fields. Respiratory effort normal. Cardiovascular system: S1 & S2 heard, RRR. No JVD, murmurs, rubs, gallops or clicks. No pedal edema. Gastrointestinal system: Abdomen is nondistended, soft and nontender. No organomegaly or masses felt. Normal bowel sounds heard. Central nervous system: Alert and oriented x3. No focal neurological deficits. Extremities: Symmetric 5 x 5 power. Skin: No rashes, lesions or ulcers Psychiatry: Judgement and insight appear normal. Mood & affect appropriate.     Data Reviewed: I have personally reviewed following labs and imaging studies  CBC: Recent Labs  Lab 08/05/21 1107 08/05/21 1153 08/06/21 0011 08/07/21 0409  WBC 12.2*  --  15.8* 6.6  NEUTROABS 10.9*  --   --  5.6  HGB 13.7 11.6* 12.1 13.2  HCT 44.4 34.0* 38.2 43.3  MCV 95.5  --  96.5 97.3  PLT 179  --  140* 364    Basic Metabolic Panel: Recent Labs  Lab 08/05/21 1107 08/05/21 1153 08/06/21 0011 08/07/21 0409  NA 149* 151* 149* 149*  K 5.2* 3.9 3.7 3.8  CL 117*  --  119* 117*  CO2 23  --  25 26  GLUCOSE 129*  --  132* 63*  BUN 31*  --  29* 21  CREATININE 1.54*  --  1.17* 0.91  CALCIUM 8.9  --  8.3* 8.4*  MG  --   --   --  2.2  PHOS  --   --   --  2.5    GFR: Estimated Creatinine Clearance: 60 mL/min (by C-G formula based on SCr of 0.91 mg/dL).  Liver Function Tests: Recent Labs  Lab 08/05/21 1107 08/07/21 0409  AST 75* 50*  ALT 21 17  ALKPHOS 223* 169*  BILITOT 1.2 0.3  PROT 7.1 6.6  ALBUMIN 1.5* <1.5*    CBG: Recent Labs  Lab 08/05/21 1048 08/07/21 0448 08/07/21 0633  GLUCAP 148* 51*  126*     Recent Results (from the past 240 hour(s))  Resp Panel by RT-PCR (Flu A&B, Covid) Nasopharyngeal Swab     Status: None   Collection Time: 08/05/21 10:46 AM   Specimen: Nasopharyngeal Swab; Nasopharyngeal(NP) swabs in vial transport medium  Result Value Ref Range Status   SARS Coronavirus 2 by RT PCR NEGATIVE NEGATIVE Final    Comment: (NOTE) SARS-CoV-2 target nucleic acids are NOT DETECTED.  The SARS-CoV-2 RNA is generally detectable in upper respiratory specimens during the acute phase of infection. The lowest concentration of SARS-CoV-2 viral copies this assay can detect is 138 copies/mL. A negative result does not preclude SARS-Cov-2 infection and should not be used as the sole basis for treatment or other patient management decisions. A negative result may occur with  improper specimen collection/handling, submission of specimen other than nasopharyngeal swab, presence of viral mutation(s) within the areas targeted by this assay, and  inadequate number of viral copies(<138 copies/mL). A negative result must be combined with clinical observations, patient history, and epidemiological information. The expected result is Negative.  Fact Sheet for Patients:  EntrepreneurPulse.com.au  Fact Sheet for Healthcare Providers:  IncredibleEmployment.be  This test is no t yet approved or cleared by the [place] FDA and  has been authorized for detection and/or diagnosis of SARS-CoV-2 by FDA under an Emergency Use Authorization (EUA). This EUA will remain  in effect (meaning this test can be used) for the duration of the COVID-19 declaration under Section 564(b)(1) of the Act, 21 U.S.C.section 360bbb-3(b)(1), unless the authorization is terminated  or revoked sooner.       Influenza A by PCR NEGATIVE NEGATIVE Final   Influenza B by PCR NEGATIVE NEGATIVE Final    Comment: (NOTE) The Xpert Xpress SARS-CoV-2/FLU/RSV plus assay is  intended as an aid in the diagnosis of influenza from Nasopharyngeal swab specimens and should not be used as a sole basis for treatment. Nasal washings and aspirates are unacceptable for Xpert Xpress SARS-CoV-2/FLU/RSV testing.  Fact Sheet for Patients: EntrepreneurPulse.com.au  Fact Sheet for Healthcare Providers: IncredibleEmployment.be  This test is not yet approved or cleared by the [place] FDA and has been authorized for detection and/or diagnosis of SARS-CoV-2 by FDA under an Emergency Use Authorization (EUA). This EUA will remain in effect (meaning this test can be used) for the duration of the COVID-19 declaration under Section 564(b)(1) of the Act, 21 U.S.C. section 360bbb-3(b)(1), unless the authorization is terminated or revoked.  Performed at [facility], [place] [address], [place], [place] [address]   Blood Culture (routine x 2)     Status: None (Preliminary result)   Collection Time: 08/05/21 10:46 AM   Specimen: BLOOD  Result Value Ref Range Status   Specimen Description BLOOD BLOOD RIGHT WRIST  Final   Special Requests   Final    BOTTLES DRAWN AEROBIC AND ANAEROBIC Blood Culture adequate volume   Culture  Setup Time   Final    GRAM NEGATIVE RODS IN BOTH AEROBIC AND ANAEROBIC BOTTLES CRITICAL VALUE NOTED.  VALUE IS CONSISTENT WITH PREVIOUSLY REPORTED AND CALLED VALUE.    Culture   Final    GRAM NEGATIVE RODS IDENTIFICATION TO FOLLOW Performed at [facility], [place] [address], [place], [place] [address]    Report Status PENDING  Incomplete  Blood Culture (routine x 2)     Status: Abnormal (Preliminary result)   Collection Time: 08/05/21 10:51 AM   Specimen: BLOOD  Result Value Ref Range Status   Specimen Description BLOOD BLOOD LEFT WRIST  Final   Special Requests   Final    BOTTLES DRAWN AEROBIC AND ANAEROBIC Blood Culture adequate volume   Culture  Setup Time   Final    GRAM NEGATIVE RODS ANAEROBIC  BOTTLE ONLY CRITICAL RESULT CALLED TO, READ BACK BY AND VERIFIED WITH: PHARMD ADRIENNE WELLBORN 08/06/21@1 :41 BY TW IN BOTH AEROBIC AND ANAEROBIC BOTTLES    Culture (A)  Final    ESCHERICHIA COLI SUSCEPTIBILITIES TO FOLLOW Performed at [facility], [place] [address] [place], [place] [address]    Report Status PENDING  Incomplete  Blood Culture ID Panel (Reflexed)     Status: Abnormal   Collection Time: 08/05/21 10:51 AM  Result Value Ref Range Status   Enterococcus faecalis NOT DETECTED NOT DETECTED Final   Enterococcus Faecium NOT DETECTED NOT DETECTED Final   Listeria monocytogenes NOT DETECTED NOT DETECTED Final  Staphylococcus species NOT DETECTED NOT DETECTED Final   Staphylococcus aureus (BCID) NOT DETECTED NOT DETECTED Final   Staphylococcus epidermidis NOT DETECTED NOT DETECTED Final   Staphylococcus lugdunensis NOT DETECTED NOT DETECTED Final   Streptococcus species NOT DETECTED NOT DETECTED Final   Streptococcus agalactiae NOT DETECTED NOT DETECTED Final   Streptococcus pneumoniae NOT DETECTED NOT DETECTED Final   Streptococcus pyogenes NOT DETECTED NOT DETECTED Final   A.calcoaceticus-baumannii NOT DETECTED NOT DETECTED Final   Bacteroides fragilis NOT DETECTED NOT DETECTED Final   Enterobacterales DETECTED (A) NOT DETECTED Final    Comment: Enterobacterales represent a large order of gram negative bacteria, not a single organism. CRITICAL RESULT CALLED TO, READ BACK BY AND VERIFIED WITH: PHARMD ADRIENNE WELLBORN 08/06/21@1 :41 BY TW    Enterobacter cloacae complex NOT DETECTED NOT DETECTED Final   Escherichia coli DETECTED (A) NOT DETECTED Final    Comment: CRITICAL RESULT CALLED TO, READ BACK BY AND VERIFIED WITH: PHARMD ADRIENNE WELLBORN 08/06/21@1 :41 BY TW    Klebsiella aerogenes NOT DETECTED NOT DETECTED Final   Klebsiella oxytoca NOT DETECTED NOT DETECTED Final   Klebsiella pneumoniae NOT DETECTED NOT DETECTED Final   Proteus species NOT DETECTED NOT  DETECTED Final   Salmonella species NOT DETECTED NOT DETECTED Final   Serratia marcescens NOT DETECTED NOT DETECTED Final   Haemophilus influenzae NOT DETECTED NOT DETECTED Final   Neisseria meningitidis NOT DETECTED NOT DETECTED Final   Pseudomonas aeruginosa NOT DETECTED NOT DETECTED Final   Stenotrophomonas maltophilia NOT DETECTED NOT DETECTED Final   Candida albicans NOT DETECTED NOT DETECTED Final   Candida auris NOT DETECTED NOT DETECTED Final   Candida glabrata NOT DETECTED NOT DETECTED Final   Candida krusei NOT DETECTED NOT DETECTED Final   Candida parapsilosis NOT DETECTED NOT DETECTED Final   Candida tropicalis NOT DETECTED NOT DETECTED Final   Cryptococcus neoformans/gattii NOT DETECTED NOT DETECTED Final   CTX-M ESBL DETECTED (A) NOT DETECTED Final    Comment: CRITICAL RESULT CALLED TO, READ BACK BY AND VERIFIED WITH: PHARMD ADRIENNE WELLBORN 08/06/21@1 :41 BY TW (NOTE) Extended spectrum beta-lactamase detected. Recommend a carbapenem as initial therapy.      Carbapenem resistance IMP NOT DETECTED NOT DETECTED Final   Carbapenem resistance KPC NOT DETECTED NOT DETECTED Final   Carbapenem resistance NDM NOT DETECTED NOT DETECTED Final   Carbapenem resist OXA 48 LIKE NOT DETECTED NOT DETECTED Final   Carbapenem resistance VIM NOT DETECTED NOT DETECTED Final    Comment: Performed at [facility], [place] [address], [place], [place] [address]  MRSA Next Gen by PCR, Nasal     Status: Abnormal   Collection Time: 08/06/21  3:00 AM   Specimen: Nasal Mucosa; Nasal Swab  Result Value Ref Range Status   MRSA by PCR Next Gen DETECTED (A) NOT DETECTED Final    Comment: RESULT CALLED TO, READ BACK BY AND VERIFIED WITH: RN Heloise Ochoa 08/06/21@4 :41 BY TW (NOTE) The GeneXpert MRSA Assay (FDA approved for NASAL specimens only), is one component of a comprehensive MRSA colonization surveillance program. It is not intended to diagnose MRSA infection nor to guide or monitor treatment  for MRSA infections. Test performance is not FDA approved in patients less than 31 years old. Performed at [facility], [place] [address], [place], [place] [address]          Radiology Studies: DG CHEST PORT 1 VIEW  Result Date: 08/06/2021 CLINICAL DATA:  Pneumonia EXAM: PORTABLE CHEST 1 VIEW COMPARISON:  Chest x-ray dated  Aug 05, 2021 FINDINGS: Cardiac and mediastinal contours are unchanged post median sternotomy and CABG. Small right pleural effusion and bibasilar opacities which are likely due to atelectasis. No evidence of pneumothorax. IMPRESSION: Small right pleural effusion and bibasilar opacities which are likely due to atelectasis. Electronically Signed   By: Yetta Glassman M.D.   On: 08/06/2021 08:18   DG Abd Portable 1V  Result Date: 08/07/2021 CLINICAL DATA:  Small bore feeding tube placement. EXAM: PORTABLE ABDOMEN - 1 VIEW COMPARISON:  05/06/2021 FINDINGS: A small bore feeding tube is noted with tip overlying the distal stomach. Visualized bowel gas pattern is unremarkable. Cardiomegaly, CABG change and small RIGHT pleural effusion again noted. IMPRESSION: Small bore feeding tube with tip overlying the distal stomach. Electronically Signed   By: Margarette Canada M.D.   On: 08/07/2021 15:15        Scheduled Meds:  Chlorhexidine Gluconate Cloth  6 each Topical Q0600   feeding supplement  237 mL Oral TID BM   heparin  5,000 Units Subcutaneous Q8H   metoprolol tartrate  2.5 mg Intravenous Q8H   mupirocin ointment  1 application. Nasal BID   pantoprazole (PROTONIX) IV  40 mg Intravenous Q24H   Continuous Infusions:  dextrose 100 mL/hr at 08/07/21 1623   meropenem (MERREM) IV 1 g (08/07/21 1440)     LOS: 2 days    Time spent: 40 minutes    Irine Seal, MD [facility]   To contact the attending provider between 7A-7P or the covering provider during after hours 7P-7A, please log into the web site [email or web address] and access using universal [place]  password for that web site. If you do not have the password, please call the hospital operator.  08/07/2021, 5:44 PM

## 2021-08-07 NOTE — Care Management Important Message (Signed)
Important Message  Patient Details  Name: [name] [name] MRN: [id number] Date of Birth: 09-Dec-1960   Medicare Important Message Given:  Yes  CORRECTION  Patient had a Precaution Order in place will mail to the patient home address.    Shaley Leavens 08/07/2021, 2:17 PM

## 2021-08-07 NOTE — Progress Notes (Signed)
PHARMACY NOTE:  ANTIMICROBIAL RENAL DOSAGE ADJUSTMENT  Current antimicrobial regimen includes a mismatch between antimicrobial dosage and estimated renal function.  As per policy approved by the Pharmacy & Therapeutics and Medical Executive Committees, the antimicrobial dosage will be adjusted accordingly.  Current antimicrobial dosage:  Meropenem 1gm IV  q12h  Indication: HCAP  Renal Function:  Estimated Creatinine Clearance: 60 mL/min (by C-G formula based on SCr of 0.91 mg/dL). '[]'$      On intermittent HD, scheduled: '[]'$      On CRRT    Antimicrobial dosage has been changed to:  Meropenem 1gm IV q8h  Additional comments:   Arbor Cohen A. Levada Dy, PharmD, BCPS, FNKF Clinical Pharmacist [place] Please utilize Amion for appropriate phone number to reach the unit pharmacist ([place])  08/07/2021 9:14 AM

## 2021-08-07 NOTE — Plan of Care (Signed)

## 2021-08-07 NOTE — Procedures (Signed)
Cortrak  Person Inserting Tube:  Maylon Peppers C, RD Tube Type:  Cortrak - 43 inches Tube Size:  10 Tube Location:  Left nare Secured by: Bridle Technique Used to Measure Tube Placement:  Marking at nare/corner of mouth Cortrak Secured At:  64 cm  Cortrak Tube Team Note:  Consult received to place a Cortrak feeding tube.   X-ray is required, abdominal x-ray has been ordered by the Cortrak team. Please confirm tube placement before using the Cortrak tube.   If the tube becomes dislodged please keep the tube and contact the Cortrak team at [email or web address] ([email or web address]) for replacement.  If after hours and replacement cannot be delayed, place a NG tube and confirm placement with an abdominal x-ray.    Lockie Pares., RD, LDN, CNSC See AMiON for contact information

## 2021-08-07 NOTE — Care Management Important Message (Signed)
Important Message  Patient Details  Name: [name] [name] MRN: [id number] Date of Birth: 03/31/1960   Medicare Important Message Given:  Yes     Orbie Pyo 08/07/2021, 2:14 PM

## 2021-08-07 NOTE — Progress Notes (Signed)
Pt had a CBG of 51. 1 amp of dextrose per protocol given. On call provider updated.

## 2021-08-07 NOTE — Consult Note (Signed)
[facility] for Infectious Disease    Date of Admission:  08/05/2021     Reason for Consult:ESBL bacteremia     Referring Physician: Dr Grandville Silos  Current antibiotics: Vancomycin Meropenem  ASSESSMENT:    61 y.o. female admitted with:  ESBL E coli bacteremia: Patient presented with severe sepsis found to have E coli bacteremia with source suspected either due to UTI vs pneumonia.  Urine culture does not appear to have been received in lab at this time and difficult to discern source based on her ROS and symptomatology, but would favor the latter with aspiration most likely.  Either way she is improved on empiric antibiotic therapy and is currently on vancomycin and meropenem.  Acute kidney injury: Presented with creatinine 1.54 and this has improved during admission. Dysphagia and protein calorie malnutrition: Patient had swallow evaluation in March noting moderate dysmotility and there is concern for inadequate nutrition especially with albumin of 1.5.  Cortrak is being considered.  Of note, she also completed a empiric course of Fluconazole for candida esophagitis but has not had endoscopy to definitively evaluate for this and symptoms seem more c/w dysphagia.  SLP eval definitely raises suspicion for aspiration leading to her admission. HFrEF: She is net negative this admission and infiltrates/hypoxia more likely due to pneumonia. History of strokes  RECOMMENDATIONS:    Continue meropenem dosed for renal function Will stop vancomycin as suspect MRSA pneumonia to be less likely Lab monitoring Wean supplemental O2 as able Nutrition Will follow   Principal Problem:   Sepsis ([place]) Active Problems:   Adult failure to thrive syndrome   MEDICATIONS:    Scheduled Meds:  Chlorhexidine Gluconate Cloth  6 each Topical Q0600   feeding supplement  237 mL Oral TID BM   heparin  5,000 Units Subcutaneous Q8H   metoprolol tartrate  2.5 mg Intravenous Q8H   mupirocin ointment  1  application. Nasal BID   pantoprazole (PROTONIX) IV  40 mg Intravenous Q24H   Continuous Infusions:  dextrose 100 mL/hr at 08/07/21 0905   meropenem (MERREM) IV     vancomycin 1,500 mg (08/06/21 1230)   PRN Meds:.acetaminophen, bisacodyl, HYDROmorphone (DILAUDID) injection, ondansetron **OR** ondansetron (ZOFRAN) IV  HPI:    RYLAN KAUFMANN is a 61 y.o. female with PMHx as noted below admitted to [facility] on 5/22 from her SNF after presenting with fevers, hypoxia and altered mental status.  She had a recent 11 day admission from 5/4-5/15 for AKI, weakness, and cough.  During that admission she was treated for possible pneumonia with Levaquin.  Upon admission here, she was found to be febrile with leukocytosis.  She was started on broad spectrum antibiotics and blood cultures obtained.  These are positive for ESBL E coli on BCID.  She is now on meropenem plus vancomycin.  Infectious work up includes a UA with pyuria and many bacteria with 0-5 squamous epithelial cells.  Urine culture shows as collected but has not resulted from 5/22.  She also underwent CTA of the chest to rule out pulmonary embolus which was most notable for patchy infiltrates in both lower lung fields right greater than left.  This was suggestive of atelectasis vs pneumonia with a small right pleural effusion.  She was initially on 15 liters via non-rebreather but is now titrated down to 3 liters via [place] and saturating at 95%.  Patient also has a history of dysphagia and during admission this month was empirically treated for candida esophagitis due to concern that  her symptoms may be consistent with this and odynophagia.    She essentially completed her course of fluconazole for this.    Past Medical History:  Diagnosis Date   Acute ST elevation myocardial infarction (STEMI) involving right coronary artery ([place]) 10/04/2015   x 2   Anxiety    Anxiety and depression    CAD (coronary artery disease)    a. CABG 06/2015: LIMA-LAD,  SVG-RCA, SVG-OM2 b. STEMI s/p PCI to distal RCA lesion with a small Promus Premier stent. ( patent LIMA to the LAD, occluded SVG to OM, occluded SVG to RCA.)   CAD (coronary artery disease) of artery bypass graft; occluded SVG to RCA and occluded SVG to OM 10/08/2015   Empty sella (HCC)    GERD (gastroesophageal reflux disease)    Headache    from stress   Hematuria wed 06-13-2020   Hidradenitis axillaris    History of kidney stones    Hyperlipemia    Hypertension    Lupus ([employer]) dx'd 2008   "they think I have this; have to get retested" (06/12/2015)   Obesity (BMI 30.0-34.9)    Pneumonia X 2   last time yrs ago   PONV (postoperative nausea and vomiting)    likes iv med or scopolamine patch works well   Psoriasis 06/15/2020   hands elbows, ears, small amount on scalp and bridge of nose   Rheumatoid aortitis    psorriatic arthritis   Rheumatoid arthritis ([place])    S/P angioplasty with stent; 10/04/15 to distal RCA lesion. with Promus premier. 10/08/2015   Scoliosis    Sleep apnea    patient states "it is mild" no cpap needed   Stroke [facility]) March 2014   left MCA branches; "they say I have them all the time", denies residual on 06/12/2015   Tobacco abuse    Type II diabetes mellitus (HCC)    Urinary frequency    and urgency   Wears glasses    for reading    Social History   Tobacco Use   Smoking status: Former    Packs/day: 0.50    Years: 36.00    Pack years: 18.00    Types: Cigarettes    Quit date: 06/12/2014    Years since quitting: 7.1   Smokeless tobacco: Never  Vaping Use   Vaping Use: Never used  Substance Use Topics   Alcohol use: No    Alcohol/week: 0.0 standard drinks   Drug use: No    Family History  Problem Relation Age of Onset   Diabetes Mother    Heart failure Mother    Breast cancer Mother        52s   Stroke Maternal Aunt    Depression Son    Diabetes Other        maternal   Breast cancer Other        2 diff great maternal aunts -  both 26s    Alzheimer's disease Other        maternal    Allergies  Allergen Reactions   Apremilast Other (See Comments)    unknown   Latex Itching, Swelling and Rash    Review of Systems  Constitutional:  Positive for fever.  HENT: Negative.    Respiratory:  Positive for cough and shortness of breath.   Cardiovascular: Negative.   Gastrointestinal: Negative.   Genitourinary:  Positive for dysuria.  Musculoskeletal: Negative.   Skin: Negative.   All other systems reviewed and are negative.  OBJECTIVE:   Blood pressure 123/82, pulse (!) 104, temperature 98 F (36.7 C), temperature source Oral, resp. rate 18, SpO2 95 %. There is no height or weight on file to calculate BMI.  Physical Exam Constitutional:      Comments: Chronically ill appearing, lying in bed, NAD.   HENT:     Head: Normocephalic and atraumatic.  Eyes:     Extraocular Movements: Extraocular movements intact.     Conjunctiva/sclera: Conjunctivae normal.  Cardiovascular:     Rate and Rhythm: Normal rate and regular rhythm.  Pulmonary:     Effort: Pulmonary effort is normal. No respiratory distress.     Comments: Coarse breath sounds bilaterally diminished at the bases.  Abdominal:     General: There is no distension.     Palpations: Abdomen is soft.     Tenderness: There is no abdominal tenderness.  Musculoskeletal:        General: Normal range of motion.     Cervical back: Normal range of motion and neck supple.  Skin:    General: Skin is warm and dry.     Findings: No rash.  Neurological:     General: No focal deficit present.     Mental Status: She is alert.     Cranial Nerves: No cranial nerve deficit.     Comments: She is oriented to person and place.  Not to time.  Psychiatric:        Mood and Affect: Mood normal.        Behavior: Behavior normal.     Lab Results: Lab Results  Component Value Date   WBC 6.6 08/07/2021   HGB 13.2 08/07/2021   HCT 43.3 08/07/2021   MCV 97.3 08/07/2021   PLT 163  08/07/2021    Lab Results  Component Value Date   NA 149 (H) 08/07/2021   K 3.8 08/07/2021   CO2 26 08/07/2021   GLUCOSE 63 (L) 08/07/2021   BUN 21 08/07/2021   CREATININE 0.91 08/07/2021   CALCIUM 8.4 (L) 08/07/2021   GFRNONAA >60 08/07/2021   GFRAA >60 12/12/2019    Lab Results  Component Value Date   ALT 17 08/07/2021   AST 50 (H) 08/07/2021   ALKPHOS 169 (H) 08/07/2021   BILITOT 0.3 08/07/2021       Component Value Date/Time   CRP 31.0 (H) 08/05/2021 2218       Component Value Date/Time   ESRSEDRATE 54 (H) 08/06/2021 0011    I have reviewed the micro and lab results in Epic.  Imaging: CT Angio Chest PE W and/or Wo Contrast  Result Date: 08/05/2021 CLINICAL DATA:  High clinical suspicion for pulmonary embolism EXAM: CT ANGIOGRAPHY CHEST WITH CONTRAST TECHNIQUE: Multidetector CT imaging of the chest was performed using the standard protocol during bolus administration of intravenous contrast. Multiplanar CT image reconstructions and MIPs were obtained to evaluate the vascular anatomy. RADIATION DOSE REDUCTION: This exam was performed according to the departmental dose-optimization program which includes automated exposure control, adjustment of the mA and/or kV according to patient size and/or use of iterative reconstruction technique. CONTRAST:  125m OMNIPAQUE IOHEXOL 350 MG/ML SOLN COMPARISON:  Previous studies including the chest radiographs done on 08/05/2021 and CT chest done on 07/18/2021 FINDINGS: Cardiovascular: There is homogeneous enhancement in the thoracic aorta. There is ectasia of ascending thoracic aorta measuring 3.7 cm. Scattered coronary artery calcifications are seen. Metallic sutures are seen in the sternum suggesting possible previous coronary bypass surgery. Heart is enlarged in size.  There are no intraluminal filling defects in the central pulmonary artery branches. Evaluation of small peripheral branches in the lower lung fields is limited by  infiltrates and less than optimal contrast enhancement. Mediastinum/Nodes: No significant lymphadenopathy seen. Lungs/Pleura: There are patchy infiltrates in the posterior aspect of both lower lung fields more so on the right side. Small right pleural effusion is seen. There is no pneumothorax. Upper Abdomen: Unremarkable. Musculoskeletal: Unremarkable. Review of the MIP images confirms the above findings. IMPRESSION: There is no evidence of pulmonary artery embolism. There is no evidence of thoracic aortic dissection. There is ectasia of ascending thoracic aorta measuring 3.7 cm. Coronary artery disease. There are patchy infiltrates in both lower lung fields, more so on the right side suggesting atelectasis/pneumonia. Small right pleural effusion is seen. Electronically Signed   By: Elmer Picker M.D.   On: 08/05/2021 14:48   DG CHEST PORT 1 VIEW  Result Date: 08/06/2021 CLINICAL DATA:  Pneumonia EXAM: PORTABLE CHEST 1 VIEW COMPARISON:  Chest x-ray dated Aug 05, 2021 FINDINGS: Cardiac and mediastinal contours are unchanged post median sternotomy and CABG. Small right pleural effusion and bibasilar opacities which are likely due to atelectasis. No evidence of pneumothorax. IMPRESSION: Small right pleural effusion and bibasilar opacities which are likely due to atelectasis. Electronically Signed   By: Yetta Glassman M.D.   On: 08/06/2021 08:18   DG Chest Port 1 View  Result Date: 08/05/2021 CLINICAL DATA:  Fever.  Possible sepsis. EXAM: PORTABLE CHEST 1 VIEW COMPARISON:  Chest x-ray dated Jul 21, 2021. FINDINGS: Unchanged mild cardiomegaly status post CABG. Normal pulmonary vascularity. Unchanged trace right pleural effusion. No consolidation or pneumothorax. No acute osseous abnormality. IMPRESSION: 1. Unchanged trace right pleural effusion. Electronically Signed   By: Titus Dubin M.D.   On: 08/05/2021 12:07     Imaging independently reviewed in Epic.  Raynelle Highland for  Infectious Disease [name] [phone] pager 08/07/2021, 11:34 AM

## 2021-08-08 DIAGNOSIS — Z515 Encounter for palliative care: Secondary | ICD-10-CM | POA: Diagnosis not present

## 2021-08-08 DIAGNOSIS — Z7189 Other specified counseling: Secondary | ICD-10-CM | POA: Diagnosis not present

## 2021-08-08 DIAGNOSIS — N179 Acute kidney failure, unspecified: Secondary | ICD-10-CM | POA: Diagnosis not present

## 2021-08-08 DIAGNOSIS — R627 Adult failure to thrive: Secondary | ICD-10-CM | POA: Diagnosis not present

## 2021-08-08 DIAGNOSIS — A498 Other bacterial infections of unspecified site: Secondary | ICD-10-CM | POA: Diagnosis not present

## 2021-08-08 DIAGNOSIS — Z1612 Extended spectrum beta lactamase (ESBL) resistance: Secondary | ICD-10-CM | POA: Diagnosis not present

## 2021-08-08 DIAGNOSIS — E876 Hypokalemia: Secondary | ICD-10-CM

## 2021-08-08 DIAGNOSIS — R1312 Dysphagia, oropharyngeal phase: Secondary | ICD-10-CM | POA: Diagnosis not present

## 2021-08-08 DIAGNOSIS — R4182 Altered mental status, unspecified: Secondary | ICD-10-CM | POA: Diagnosis not present

## 2021-08-08 DIAGNOSIS — A419 Sepsis, unspecified organism: Secondary | ICD-10-CM | POA: Diagnosis not present

## 2021-08-08 LAB — CULTURE, BLOOD (ROUTINE X 2)
Special Requests: ADEQUATE
Special Requests: ADEQUATE

## 2021-08-08 LAB — RENAL FUNCTION PANEL
Albumin: 2.1 g/dL — ABNORMAL LOW (ref 3.5–5.0)
Anion gap: 3 — ABNORMAL LOW (ref 5–15)
BUN: 14 mg/dL (ref 8–23)
CO2: 25 mmol/L (ref 22–32)
Calcium: 8.1 mg/dL — ABNORMAL LOW (ref 8.9–10.3)
Chloride: 113 mmol/L — ABNORMAL HIGH (ref 98–111)
Creatinine, Ser: 0.71 mg/dL (ref 0.44–1.00)
GFR, Estimated: >60 mL/min (ref >60)
Glucose, Bld: 150 mg/dL — ABNORMAL HIGH (ref 70–99)
Phosphorus: 1.6 mg/dL — ABNORMAL LOW (ref 2.5–4.6)
Potassium: 3 mmol/L — ABNORMAL LOW (ref 3.5–5.1)
Sodium: 141 mmol/L (ref 135–145)

## 2021-08-08 LAB — CBC
HCT: 31.9 % — ABNORMAL LOW (ref 36.0–46.0)
Hemoglobin: 10.1 g/dL — ABNORMAL LOW (ref 12.0–15.0)
MCH: 30.4 pg (ref 26.0–34.0)
MCHC: 31.7 g/dL (ref 30.0–36.0)
MCV: 96.1 fL (ref 80.0–100.0)
Platelets: 148 10*3/uL — ABNORMAL LOW (ref 150–400)
RBC: 3.32 MIL/uL — ABNORMAL LOW (ref 3.87–5.11)
RDW: 16.6 % — ABNORMAL HIGH (ref 11.5–15.5)
WBC: 7.4 10*3/uL (ref 4.0–10.5)
nRBC: 0 % (ref 0.0–0.2)

## 2021-08-08 LAB — GLUCOSE, CAPILLARY
Glucose-Capillary: 141 mg/dL — ABNORMAL HIGH (ref 70–99)
Glucose-Capillary: 121 mg/dL — ABNORMAL HIGH (ref 70–99)
Glucose-Capillary: 148 mg/dL — ABNORMAL HIGH (ref 70–99)
Glucose-Capillary: 141 mg/dL — ABNORMAL HIGH (ref 70–99)

## 2021-08-08 LAB — PHOSPHORUS: Phosphorus: 2.6 mg/dL (ref 2.5–4.6)

## 2021-08-08 LAB — MAGNESIUM
Magnesium: 1.9 mg/dL (ref 1.7–2.4)
Magnesium: 1.8 mg/dL (ref 1.7–2.4)

## 2021-08-08 MED ORDER — POTASSIUM CHLORIDE 10 MEQ/100ML IV SOLN
10.0000 meq | INTRAVENOUS | Status: AC
  Administered 2021-08-08 (×6): 10 meq via INTRAVENOUS
  Filled 2021-08-08 (×5): qty 100

## 2021-08-08 MED ORDER — POTASSIUM PHOSPHATES 15 MMOLE/5ML IV SOLN
30.0000 mmol | Freq: Once | INTRAVENOUS | Status: AC
  Administered 2021-08-08: 30 mmol via INTRAVENOUS
  Filled 2021-08-08: qty 10

## 2021-08-08 MED ORDER — FREE WATER
175.0000 mL | Status: DC
  Administered 2021-08-08 – 2021-08-21 (×71): 175 mL

## 2021-08-08 MED ORDER — DEXTROSE-NACL 5-0.45 % IV SOLN: INTRAVENOUS | Status: DC

## 2021-08-08 MED ORDER — PROSOURCE TF PO LIQD
45.0000 mL | Freq: Every day | ORAL | Status: DC
  Administered 2021-08-08 – 2021-08-14 (×7): 45 mL
  Filled 2021-08-08 (×7): qty 45

## 2021-08-08 MED ORDER — OSMOLITE 1.5 CAL PO LIQD
1000.0000 mL | ORAL | Status: DC
  Administered 2021-08-08 – 2021-08-14 (×8): 1000 mL
  Filled 2021-08-08 (×7): qty 1000

## 2021-08-08 NOTE — Progress Notes (Signed)
Nutrition Follow-up / Consult  DOCUMENTATION CODES:   Non-severe (moderate) malnutrition in context of chronic illness  INTERVENTION:   Initiate tube feeding via Cortrak: Osmolite 1.5 at 25 ml/h, increase by 10 ml every 8 hours to goal rate of 55 ml/h (1320 ml per day) Prosource TF 45 ml once daily  Provides 2020 kcal, 94 gm protein, 1006 ml free water daily.  Free water flushes 175 ml every 4 hours for a total of 2056 ml free water daily.  Continue to monitor magnesium, potassium, and phosphorus BID for at least 3 days, MD to replete as needed, as pt is at risk for refeeding syndrome given malnutrition with minimal intake for several days.  Obtain current weight measurement.   NUTRITION DIAGNOSIS:   Moderate Malnutrition related to chronic illness (dysphagia) as evidenced by mild muscle depletion, moderate muscle depletion, mild fat depletion, moderate fat depletion.  Ongoing   GOAL:   Patient will meet greater than or equal to 90% of their needs  Progressing  MONITOR:   Skin, Labs, Diet advancement  REASON FOR ASSESSMENT:   Malnutrition Screening Tool    ASSESSMENT:   61 yo female admitted with whole body pain. PMH includes HTN, lupus, hidradenitis axillaris, scoliosis, anxiety, CAD, stroke, STEMI, DM-2, GERD, psoriatic arthritis, FTT, dysphagia.  Patient remains NPO. Cortrak tube was placed 5/24, tip is in the distal stomach per x-ray. Received MD Consult for TF initiation and management. Currently receiving Osmolite 1.5 at 20 ml/h. Checking magnesium and phosphorus levels d/t refeeding risk.  Potassium and phosphorus low this morning, IV KCl and KPhos ordered.   Labs reviewed. K 3, phos 1.6 (both low likely d/t refeeding syndrome) CBG: 141-121  Medications reviewed and include Protonix. Potassium chloride, potassium phosphate. IVF: D5 1/2 NS at 100 ml/h  Diet Order:   Diet Order             Diet NPO time specified Except for: Ice Chips  Diet effective  now                   EDUCATION NEEDS:   No education needs have been identified at this time  Skin:  Skin Assessment: Skin Integrity Issues: Skin Integrity Issues:: Stage II Stage II: R buttocks, R hip  Last BM:  No BM documented  Height:   Ht Readings from Last 1 Encounters:  07/30/21 '5\' 6"'$  (1.676 m)    Weight:   Wt Readings from Last 1 Encounters:  07/30/21 68 kg     BMI:  24.2 (using most recent ht and wt from 07/30/21)  Estimated Nutritional Needs:   Kcal:  1900-2100  Protein:  90-105 gm  Fluid:  >/= 1.9 L    Lucas Mallow RD, LDN, CNSC Please refer to Amion for contact information.

## 2021-08-08 NOTE — Progress Notes (Signed)
[facility] for Infectious Disease  Date of Admission:  08/05/2021           Reason for visit: Follow up on ESBL bacteremia                                     Current antibiotics: Meropenem  ASSESSMENT:    61 y.o. female admitted with:  ESBL E coli bacteremia: Patient presented with severe sepsis found to have E coli bacteremia with source suspected either due to UTI vs pneumonia.  Urine culture does not appear to have been received in lab and difficult to discern source based on her ROS and symptomatology, but would favor the latter with aspiration most likely.  Either way she is improved on empiric antibiotic therapy and is currently on meropenem.  Acute kidney injury: Presented with creatinine 1.54 and this has improved during admission. Dysphagia and protein calorie malnutrition: Patient had swallow evaluation in March noting moderate dysmotility and there is concern for inadequate nutrition especially with albumin of 1.5.  Cortrak placed 5/24.  Of note, she also completed a empiric course of Fluconazole for candida esophagitis but has not had endoscopy to definitively evaluate for this and symptoms seem more c/w dysphagia.  SLP eval definitely raises suspicion for aspiration leading to her admission. HFrEF: She is net negative this admission and infiltrates/hypoxia more likely due to pneumonia. History of strokes  RECOMMENDATIONS:    Continue meropenem Plan for 7 days of treatment end date 08/13/21 Will sign off, please call as needed   Principal Problem:   Sepsis ([place]) Active Problems:   Rheumatoid arthritis ([place])   Gastroesophageal reflux disease without esophagitis   AMS (altered mental status)   Hypokalemia   Hypernatremia   Pressure injury of skin   Malnutrition of moderate degree   AKI (acute kidney injury) ([place])   Adult failure to thrive syndrome   Dysphagia   Chronic systolic CHF (congestive heart failure) (HCC)   Infection due to ESBL-producing  Escherichia coli   Hypophosphatemia    MEDICATIONS:    Scheduled Meds:  Chlorhexidine Gluconate Cloth  6 each Topical Q0600   feeding supplement (OSMOLITE 1.5 CAL)  1,000 mL Per Tube Q24H   feeding supplement (PROSource TF)  45 mL Per Tube Daily   free water  175 mL Per Tube Q4H   heparin  5,000 Units Subcutaneous Q8H   metoprolol tartrate  2.5 mg Intravenous Q8H   mupirocin ointment  1 application. Nasal BID   pantoprazole (PROTONIX) IV  40 mg Intravenous Q24H   Continuous Infusions:  albumin human 25 g (08/08/21 0759)   dextrose 5 % and 0.45% NaCl     meropenem (MERREM) IV 1 g (08/07/21 2302)   potassium chloride 10 mEq (08/08/21 1019)   potassium PHOSPHATE IVPB (in mmol) 30 mmol (08/08/21 1024)   PRN Meds:.acetaminophen, bisacodyl, HYDROmorphone (DILAUDID) injection, ondansetron **OR** ondansetron (ZOFRAN) IV  SUBJECTIVE:   24 hour events:  Cortrack placed   No new complaints.  Reports feeling better today.  Review of Systems  All other systems reviewed and are negative.    OBJECTIVE:   Blood pressure 140/77, pulse 66, temperature 98.5 F (36.9 C), temperature source Oral, resp. rate (!) 23, SpO2 96 %. There is no height or weight on file to calculate BMI.  Physical Exam Constitutional:      General: She is not in acute distress.  Appearance: Normal appearance.  HENT:     Head: Normocephalic and atraumatic.     Nose:     Comments: Cortrack placed.  Eyes:     Extraocular Movements: Extraocular movements intact.     Conjunctiva/sclera: Conjunctivae normal.  Pulmonary:     Effort: Pulmonary effort is normal. No respiratory distress.     Comments: On 3 liters via [place] Abdominal:     General: There is no distension.     Palpations: Abdomen is soft.     Tenderness: There is no abdominal tenderness.  Musculoskeletal:        General: Normal range of motion.     Cervical back: Normal range of motion and neck supple.  Skin:    General: Skin is warm and dry.   Neurological:     General: No focal deficit present.     Mental Status: She is alert.     Cranial Nerves: No cranial nerve deficit.     Comments: She seems more alert today and interactive.   Psychiatric:        Mood and Affect: Mood normal.        Behavior: Behavior normal.     Lab Results: Lab Results  Component Value Date   WBC 7.4 08/08/2021   HGB 10.1 (L) 08/08/2021   HCT 31.9 (L) 08/08/2021   MCV 96.1 08/08/2021   PLT 148 (L) 08/08/2021    Lab Results  Component Value Date   NA 141 08/08/2021   K 3.0 (L) 08/08/2021   CO2 25 08/08/2021   GLUCOSE 150 (H) 08/08/2021   BUN 14 08/08/2021   CREATININE 0.71 08/08/2021   CALCIUM 8.1 (L) 08/08/2021   GFRNONAA >60 08/08/2021   GFRAA >60 12/12/2019    Lab Results  Component Value Date   ALT 17 08/07/2021   AST 50 (H) 08/07/2021   ALKPHOS 169 (H) 08/07/2021   BILITOT 0.3 08/07/2021       Component Value Date/Time   CRP 31.0 (H) 08/05/2021 2218       Component Value Date/Time   ESRSEDRATE 54 (H) 08/06/2021 0011     I have reviewed the micro and lab results in Epic.  Imaging: DG Abd Portable 1V  Result Date: 08/07/2021 CLINICAL DATA:  Small bore feeding tube placement. EXAM: PORTABLE ABDOMEN - 1 VIEW COMPARISON:  05/06/2021 FINDINGS: A small bore feeding tube is noted with tip overlying the distal stomach. Visualized bowel gas pattern is unremarkable. Cardiomegaly, CABG change and small RIGHT pleural effusion again noted. IMPRESSION: Small bore feeding tube with tip overlying the distal stomach. Electronically Signed   By: Margarette Canada M.D.   On: 08/07/2021 15:15     Imaging independently reviewed in Epic.    [facility] for Infectious Disease [facility] [phone] pager 08/08/2021, 11:11 AM

## 2021-08-08 NOTE — Progress Notes (Signed)
Pt potassium low.  Reached out to on call provider.  Please see orders.  Other IV meds will be off schedule.

## 2021-08-08 NOTE — Progress Notes (Addendum)
PROGRESS NOTE    [name] [name]  SJG:[id number] DOB: October 23, 1960 DOA: 08/05/2021 PCP: Leeroy Cha, MD    Chief Complaint  Patient presents with   Altered Mental Status    Brief Narrative:  HPI per Dr. Wynetta Fines on 08/05/21 [name] [name] is a 61 y.o. female with medical history significant of failure to thrive, dysphagia secondary to moderate esophageal dysmotility (barium swallow March 2023), psoriasis arthritis, CAD status post CABG, DM-2, chronic systolic CHF LVEF 65%, was sent to hospital for evaluation of altered mentations hypoxia and fever.   Patient was recently hospitalized and discharged to nursing home 11 days ago after treatment of supposed empyema and pneumonia.  Patient has established history of failure to thrive and has been losing weight, and according to the record she lost about 13 kg in last 59-month.    Patient unable to provide any history as she is confused and agitated.  All history provided by patient and [name]([phone], and patient was discharged on soft diet, last week, her diet was downgraded to pured.  Despite, family observed several times patient had choking episode after eating for food.  This morning, family was contacted about patient had after mentations and hypoxia   ED Course: Patient was found hypoxic initially briefly on 15 L of nonrebreather and titrated down to 5 L, tachycardia hypotensive but responded to IV fluid.  Febrile 101.4   Angiogram negative for PE but patchy infiltrates on bilateral lower lung fields suspicious for pneumonia.  Small right-sided pleural effusion.   Patient was started on vancomycin and cefepime.  Sodium 149, K5.2, creatinine 1.5, WBC 12.2.   **Interim History Patient feels short of breath but oxygen requirement has been weaned from 5 L to 3 L.  Still having difficulty swallowing and managing secretions so she is made n.p.o.  Discussed with daughter about a small bore feeding tube and she may be agreeable  for tomorrow.  She is found to have an ESBL E. coli bacteremia and is now on IV meropenem     Assessment & Plan:   Principal Problem:   Sepsis ([facility] Active Problems:   Rheumatoid [facility] ([facility]   Gastroesophageal reflux disease without esophagitis   AMS (altered mental status)   Hypokalemia   Hypernatremia   Pressure injury of skin   Malnutrition of moderate degree   AKI (acute kidney injury) ([facility]   Adult failure to thrive syndrome   Dysphagia   Chronic systolic CHF (congestive heart failure) (HCC)   Infection due to ESBL-producing Escherichia coli   Hypophosphatemia  #1 severe sepsis secondary to ESBL E. coli bacteremia, aspiration pneumonia, possible UTI/acute respiratory failure with hypoxia/lactic acidosis. -Patient noted to have met criteria for sepsis with tachycardia, new onset hypoxia, fever, suspected source noted to be pneumonia, bacteremia UTI.  Patient noted with signs of endorgan damage of AKI and acute encephalopathy. -Improving clinically. -Patient with history of aspiration and radiologic pattern of pneumonia with concern for aspiration pneumonia. -MRSA PCR positive. -Patient was on IV vancomycin, IV cefepime and IV cefepime changed to IV Merrem due to ESBL E. coli. -Patient seen by speech therapy and made n.p.o. due to increased risk of aspiration and difficulty with clearing secretions. -Leukocytosis trending down. -Procalcitonin noted to be elevated but not repeated this morning. -Blood cultures done positive for ESBL E. coli. -Patient with poor nutritional status, currently n.p.o., likely needs feeding tube in the future patient cannot meet calorie intake treatment with recurrent episodes of aspiration despite being placed on  a dysphagia 1 diet. -Feeding tube ordered. -Patient seen in consultation by ID and IV vancomycin has been discontinued and recommending continuation of Merrem to treat for total of 7 days. -Supportive care..  2.  Failure to  thrive/dysphagia/severe protein calorie malnutrition -Patient noted to be cachectic, with some temporal wasting, failure to thrive. -Patient with esophageal dysmotility. -Patient noted to have had a swallow evaluation in March noting moderate dysmotility concern for inadequate nutrition as albumin noted to < 1.5. -Cortrack ordered and placed 08/07/2021. -Consulted with dietitian to start tube feeds. -Continue IV albumin every 6 hours x24 hours.  -Status post course of fluconazole for Candida esophagitis during last hospitalization. -Palliative care consultation for goals of care pending.  3.  Acute kidney injury -Likely secondary to prerenal azotemia in the setting of severe dehydration, volume contraction. -Patient noted to have been on half-normal saline which was changed to D5W. -Renal function improved with hydration. -Avoid nephrotoxins. -IV fluids being changed to D5 half-normal saline. -Follow.  4.  Hypernatremia -Secondary to dehydration, failure to thrive, volume depletion.  -Improved on D5W sodium level at 141 today.   -Change IV fluids to D5 half-normal saline.  -Follow.  5.  UTI with hematuria versus bacteriuria -Is noted to be turbid, moderate leukocytes, nitrite negative, 21-50 WBCs, WBC clumps noted, many bacteria. -Urine cultures sent 08/05/2021 with results still pending. -On IV Merrem.  6.  Hyperkalemia>>>> hypokalemia/hypophosphatemia -Likely secondary to acute kidney injury. -Hyperkalemia improved currently resolved patient now hypokalemic with a potassium of 3.0.   -Potassium be repleted.   -Phosphorus noted at 1.6.   -We will give K-Phos 30 mmol IV x1.   -Repeat labs in the A M.  7.  Acute metabolic encephalopathy -Likely secondary to sepsis/acute infection. -Improving clinically. -Continue IV antibiotics. -Follow.  8.  Hypertension -Patient currently n.p.o. with esophageal dysmotility. -Blood pressure currently controlled on IV Lopressor 2.5 mg every  8 hours.  9.  Chronic systolic CHF -Patient dehydrated, volume contracted and also with hypoxia from pneumonia. -Continue gentle hydration and change IV fluids to D5 half-normal saline.   -No signs of acute decompensation. -Follow.  10.  Psoriasis arthritis -No clear etiology of esophageal dysmotility, unsure whether related to psoriatic arthritis which is likely poorly controlled. -We will need outpatient follow-up with rheumatology.  11.  Thrombocytopenia -In the setting of sepsis. -Improved.  12.  GERD -Continue IV PPI.  13.  Anemia -Patient with no overt bleeding. -Likely dilutional component. -Check an anemia panel. -Follow H&H. -Transfusion threshold hemoglobin < 7.   14.  Pressure injury, POA Pressure Injury 08/05/21 Buttocks Right Stage 2 -  Partial thickness loss of dermis presenting as a shallow open injury with a red, pink wound bed without slough. red, no drainage (Active)  08/05/21 1835  Location: Buttocks  Location Orientation: Right  Staging: Stage 2 -  Partial thickness loss of dermis presenting as a shallow open injury with a red, pink wound bed without slough.  Wound Description (Comments): red, no drainage  Present on Admission: Yes     Pressure Injury 08/05/21 Hip Right Stage 2 -  Partial thickness loss of dermis presenting as a shallow open injury with a red, pink wound bed without slough. (Active)  08/05/21 1837  Location: Hip  Location Orientation: Right  Staging: Stage 2 -  Partial thickness loss of dermis presenting as a shallow open injury with a red, pink wound bed without slough.  Wound Description (Comments):   Present on Admission: Yes  DVT prophylaxis: Heparin Code Status: Full Family Communication: Updated patient.  No family at bedside. Disposition: Likely back to SNF.  Status is: Inpatient Remains inpatient appropriate because: Severity of illness.   Consultants:  ID: Dr. Juleen China 08/07/2021 [facility] Palliative care  pending  Procedures:  CT angiogram chest 08/05/2021 Chest x-ray 08/05/2021, 08/06/2021 Abdominal films 08/07/2021 Cortrack placement pending 08/07/2021   Antimicrobials:  IV cefepime 08/05/2021>>>> 08/06/2021 IV Merrem 08/06/2021>>>> 08/13/2021 IV vancomycin 08/05/2021>>>> 08/07/2021   Subjective: Sitting up in bed.  Cortrack in place.  Overall states she is feeling better.  No chest pain.  No shortness of breath.    Objective: Vitals:   08/08/21 0840 08/08/21 0900 08/08/21 0940 08/08/21 1000  BP: 137/82  140/77   Pulse:      Resp:  20  (!) 23  Temp:      TempSrc:      SpO2:        Intake/Output Summary (Last 24 hours) at 08/08/2021 1207 Last data filed at 08/08/2021 0800 Gross per 24 hour  Intake 3932.25 ml  Output 800 ml  Net 3132.25 ml    There were no vitals filed for this visit.  Examination:  General exam: Frail.  Cachectic.  Some temporal wasting. Cortrack in place. Respiratory system: CTA B anterior lung fields.  No wheezes, no crackles, no rhonchi.  Fair air movement.  Speaking in full sentences.  Cardiovascular system: Regular rate rhythm no murmurs rubs or gallops.  No JVD.  No lower extremity edema. Gastrointestinal system: Abdomen is soft, nontender, nondistended, positive bowel sounds.  No rebound.  No guarding. Central nervous system: Alert and oriented x2. No focal neurological deficits. Extremities: Symmetric 5 x 5 power. Skin: No rashes, lesions or ulcers Psychiatry: Judgement and insight appear normal. Mood & affect appropriate.     Data Reviewed: I have personally reviewed following labs and imaging studies  CBC: Recent Labs  Lab 08/05/21 1107 08/05/21 1153 08/06/21 0011 08/07/21 0409 08/08/21 0416  WBC 12.2*  --  15.8* 6.6 7.4  NEUTROABS 10.9*  --   --  5.6  --   HGB 13.7 11.6* 12.1 13.2 10.1*  HCT 44.4 34.0* 38.2 43.3 31.9*  MCV 95.5  --  96.5 97.3 96.1  PLT 179  --  140* 163 148*     Basic Metabolic Panel: Recent Labs  Lab  08/05/21 1107 08/05/21 1153 08/06/21 0011 08/07/21 0409 08/07/21 1826 08/08/21 0416  NA 149* 151* 149* 149*  --  141  K 5.2* 3.9 3.7 3.8  --  3.0*  CL 117*  --  119* 117*  --  113*  CO2 23  --  25 26  --  25  GLUCOSE 129*  --  132* 63*  --  150*  BUN 31*  --  29* 21  --  14  CREATININE 1.54*  --  1.17* 0.91  --  0.71  CALCIUM 8.9  --  8.3* 8.4*  --  8.1*  MG  --   --   --  2.2 1.9 1.9  PHOS  --   --   --  2.5 1.8* 1.6*     GFR: Estimated Creatinine Clearance: 68.3 mL/min (by C-G formula based on SCr of 0.71 mg/dL).  Liver Function Tests: Recent Labs  Lab 08/05/21 1107 08/07/21 0409 08/08/21 0416  AST 75* 50*  --   ALT 21 17  --   ALKPHOS 223* 169*  --   BILITOT 1.2 0.3  --   PROT 7.1  6.6  --   ALBUMIN 1.5* <1.5* 2.1*     CBG: Recent Labs  Lab 08/07/21 0633 08/07/21 2118 08/07/21 2350 08/08/21 0409 08/08/21 0726  GLUCAP 126* 75 98 141* 121*      Recent Results (from the past 240 hour(s))  Resp Panel by RT-PCR (Flu A&B, Covid) Nasopharyngeal Swab     Status: None   Collection Time: 08/05/21 10:46 AM   Specimen: Nasopharyngeal Swab; Nasopharyngeal(NP) swabs in vial transport medium  Result Value Ref Range Status   SARS Coronavirus 2 by RT PCR NEGATIVE NEGATIVE Final    Comment: (NOTE) SARS-CoV-2 target nucleic acids are NOT DETECTED.  The SARS-CoV-2 RNA is generally detectable in upper respiratory specimens during the acute phase of infection. The lowest concentration of SARS-CoV-2 viral copies this assay can detect is 138 copies/mL. A negative result does not preclude SARS-Cov-2 infection and should not be used as the sole basis for treatment or other patient management decisions. A negative result may occur with  improper specimen collection/handling, submission of specimen other than nasopharyngeal swab, presence of viral mutation(s) within the areas targeted by this assay, and inadequate number of viral copies(<138 copies/mL). A negative result  must be combined with clinical observations, patient history, and epidemiological information. The expected result is Negative.  Fact Sheet for Patients:  EntrepreneurPulse.com.au  Fact Sheet for Healthcare Providers:  IncredibleEmployment.be  This test is no t yet approved or cleared by the [place] FDA and  has been authorized for detection and/or diagnosis of SARS-CoV-2 by FDA under an Emergency Use Authorization (EUA). This EUA will remain  in effect (meaning this test can be used) for the duration of the COVID-19 declaration under Section 564(b)(1) of the Act, 21 U.S.C.section 360bbb-3(b)(1), unless the authorization is terminated  or revoked sooner.       Influenza A by PCR NEGATIVE NEGATIVE Final   Influenza B by PCR NEGATIVE NEGATIVE Final    Comment: (NOTE) The Xpert Xpress SARS-CoV-2/FLU/RSV plus assay is intended as an aid in the diagnosis of influenza from Nasopharyngeal swab specimens and should not be used as a sole basis for treatment. Nasal washings and aspirates are unacceptable for Xpert Xpress SARS-CoV-2/FLU/RSV testing.  Fact Sheet for Patients: EntrepreneurPulse.com.au  Fact Sheet for Healthcare Providers: IncredibleEmployment.be  This test is not yet approved or cleared by the [place] FDA and has been authorized for detection and/or diagnosis of SARS-CoV-2 by FDA under an Emergency Use Authorization (EUA). This EUA will remain in effect (meaning this test can be used) for the duration of the COVID-19 declaration under Section 564(b)(1) of the Act, 21 U.S.C. section 360bbb-3(b)(1), unless the authorization is terminated or revoked.  Performed at [facility], [place] [address], [place],  [address]   Blood Culture (routine x 2)     Status: Abnormal   Collection Time: 08/05/21 10:46 AM   Specimen: BLOOD  Result Value Ref Range Status   Specimen Description  BLOOD BLOOD RIGHT WRIST  Final   Special Requests   Final    BOTTLES DRAWN AEROBIC AND ANAEROBIC Blood Culture adequate volume   Culture  Setup Time   Final    GRAM NEGATIVE RODS IN BOTH AEROBIC AND ANAEROBIC BOTTLES CRITICAL VALUE NOTED.  VALUE IS CONSISTENT WITH PREVIOUSLY REPORTED AND CALLED VALUE.    Culture (A)  Final    ESCHERICHIA COLI SUSCEPTIBILITIES PERFORMED ON PREVIOUS CULTURE WITHIN THE LAST 5 DAYS. Performed at [facility], [place] [address] [place],  [address]  Report Status 08/08/2021 FINAL  Final  Blood Culture (routine x 2)     Status: Abnormal   Collection Time: 08/05/21 10:51 AM   Specimen: BLOOD  Result Value Ref Range Status   Specimen Description BLOOD BLOOD LEFT WRIST  Final   Special Requests   Final    BOTTLES DRAWN AEROBIC AND ANAEROBIC Blood Culture adequate volume   Culture  Setup Time   Final    GRAM NEGATIVE RODS ANAEROBIC BOTTLE ONLY CRITICAL RESULT CALLED TO, READ BACK BY AND VERIFIED WITH: PHARMD ADRIENNE WELLBORN 08/06/21_0 :41 BY TW IN BOTH AEROBIC AND ANAEROBIC BOTTLES Performed at [facility], [place] [address] [place], [place] [address]    Culture (A)  Final    ESCHERICHIA COLI Confirmed Extended Spectrum Beta-Lactamase Producer (ESBL).  In bloodstream infections from ESBL organisms, carbapenems are preferred over piperacillin/tazobactam. They are shown to have a lower risk of mortality.    Report Status 08/08/2021 FINAL  Final   Organism ID, Bacteria ESCHERICHIA COLI  Final      Susceptibility   Escherichia coli - MIC*    AMPICILLIN >=32 RESISTANT Resistant     CEFAZOLIN >=64 RESISTANT Resistant     CEFEPIME >=32 RESISTANT Resistant     CEFTAZIDIME >=64 RESISTANT Resistant     CEFTRIAXONE >=64 RESISTANT Resistant     CIPROFLOXACIN >=4 RESISTANT Resistant     GENTAMICIN <=1 SENSITIVE Sensitive     IMIPENEM <=0.25 SENSITIVE Sensitive     TRIMETH/SULFA >=320 RESISTANT Resistant     AMPICILLIN/SULBACTAM >=32 RESISTANT  Resistant     PIP/TAZO 16 SENSITIVE Sensitive     * ESCHERICHIA COLI  Blood Culture ID Panel (Reflexed)     Status: Abnormal   Collection Time: 08/05/21 10:51 AM  Result Value Ref Range Status   Enterococcus faecalis NOT DETECTED NOT DETECTED Final   Enterococcus Faecium NOT DETECTED NOT DETECTED Final   Listeria monocytogenes NOT DETECTED NOT DETECTED Final   Staphylococcus species NOT DETECTED NOT DETECTED Final   Staphylococcus aureus (BCID) NOT DETECTED NOT DETECTED Final   Staphylococcus epidermidis NOT DETECTED NOT DETECTED Final   Staphylococcus lugdunensis NOT DETECTED NOT DETECTED Final   Streptococcus species NOT DETECTED NOT DETECTED Final   Streptococcus agalactiae NOT DETECTED NOT DETECTED Final   Streptococcus pneumoniae NOT DETECTED NOT DETECTED Final   Streptococcus pyogenes NOT DETECTED NOT DETECTED Final   A.calcoaceticus-baumannii NOT DETECTED NOT DETECTED Final   Bacteroides fragilis NOT DETECTED NOT DETECTED Final   Enterobacterales DETECTED (A) NOT DETECTED Final    Comment: Enterobacterales represent a large order of gram negative bacteria, not a single organism. CRITICAL RESULT CALLED TO, READ BACK BY AND VERIFIED WITH: PHARMD ADRIENNE WELLBORN 08/06/21_1 :41 BY TW    Enterobacter cloacae complex NOT DETECTED NOT DETECTED Final   Escherichia coli DETECTED (A) NOT DETECTED Final    Comment: CRITICAL RESULT CALLED TO, READ BACK BY AND VERIFIED WITH: PHARMD ADRIENNE WELLBORN 08/06/21_2 :41 BY TW    Klebsiella aerogenes NOT DETECTED NOT DETECTED Final   Klebsiella oxytoca NOT DETECTED NOT DETECTED Final   Klebsiella pneumoniae NOT DETECTED NOT DETECTED Final   Proteus species NOT DETECTED NOT DETECTED Final   Salmonella species NOT DETECTED NOT DETECTED Final   Serratia marcescens NOT DETECTED NOT DETECTED Final   Haemophilus influenzae NOT DETECTED NOT DETECTED Final   Neisseria meningitidis NOT DETECTED NOT DETECTED Final   Pseudomonas aeruginosa NOT DETECTED  NOT DETECTED Final   Stenotrophomonas maltophilia NOT DETECTED NOT DETECTED Final   Candida  albicans NOT DETECTED NOT DETECTED Final   Candida auris NOT DETECTED NOT DETECTED Final   Candida glabrata NOT DETECTED NOT DETECTED Final   Candida krusei NOT DETECTED NOT DETECTED Final   Candida parapsilosis NOT DETECTED NOT DETECTED Final   Candida tropicalis NOT DETECTED NOT DETECTED Final   Cryptococcus neoformans/gattii NOT DETECTED NOT DETECTED Final   CTX-M ESBL DETECTED (A) NOT DETECTED Final    Comment: CRITICAL RESULT CALLED TO, READ BACK BY AND VERIFIED WITH: PHARMD ADRIENNE WELLBORN 08/06/21_0 :41 BY TW (NOTE) Extended spectrum beta-lactamase detected. Recommend a carbapenem as initial therapy.      Carbapenem resistance IMP NOT DETECTED NOT DETECTED Final   Carbapenem resistance KPC NOT DETECTED NOT DETECTED Final   Carbapenem resistance NDM NOT DETECTED NOT DETECTED Final   Carbapenem resist OXA 48 LIKE NOT DETECTED NOT DETECTED Final   Carbapenem resistance VIM NOT DETECTED NOT DETECTED Final    Comment: Performed at [facility], [place] [address], [place], [place] [address]  MRSA Next Gen by PCR, Nasal     Status: Abnormal   Collection Time: 08/06/21  3:00 AM   Specimen: Nasal Mucosa; Nasal Swab  Result Value Ref Range Status   MRSA by PCR Next Gen DETECTED (A) NOT DETECTED Final    Comment: RESULT CALLED TO, READ BACK BY AND VERIFIED WITH: RN Heloise Ochoa 08/06/21_1 :41 BY TW (NOTE) The GeneXpert MRSA Assay (FDA approved for NASAL specimens only), is one component of a comprehensive MRSA colonization surveillance program. It is not intended to diagnose MRSA infection nor to guide or monitor treatment for MRSA infections. Test performance is not FDA approved in patients less than 65 years old. Performed at [facility], [place] [address], [place], [place] [address]           Radiology Studies: DG Abd Portable 1V  Result Date: 08/07/2021 CLINICAL DATA:  Small  bore feeding tube placement. EXAM: PORTABLE ABDOMEN - 1 VIEW COMPARISON:  05/06/2021 FINDINGS: A small bore feeding tube is noted with tip overlying the distal stomach. Visualized bowel gas pattern is unremarkable. Cardiomegaly, CABG change and small RIGHT pleural effusion again noted. IMPRESSION: Small bore feeding tube with tip overlying the distal stomach. Electronically Signed   By: Margarette Canada M.D.   On: 08/07/2021 15:15        Scheduled Meds:  Chlorhexidine Gluconate Cloth  6 each Topical Q0600   feeding supplement (OSMOLITE 1.5 CAL)  1,000 mL Per Tube Q24H   feeding supplement (PROSource TF)  45 mL Per Tube Daily   free water  175 mL Per Tube Q4H   heparin  5,000 Units Subcutaneous Q8H   metoprolol tartrate  2.5 mg Intravenous Q8H   mupirocin ointment  1 application. Nasal BID   pantoprazole (PROTONIX) IV  40 mg Intravenous Q24H   Continuous Infusions:  albumin human 25 g (08/08/21 0759)   dextrose 5 % and 0.45% NaCl     meropenem (MERREM) IV 1 g (08/07/21 2302)   potassium chloride 10 mEq (08/08/21 1019)   potassium PHOSPHATE IVPB (in mmol) 30 mmol (08/08/21 1024)     LOS: 3 days    Time spent: 40 minutes    Irine Seal, MD [facility]   To contact the attending provider between 7A-7P or the covering provider during after hours 7P-7A, please log into the web site [email or web address] and access using universal [place] password for that web site. If you do not have the password, please call the hospital  operator.  08/08/2021, 12:07 PM

## 2021-08-08 NOTE — Consult Note (Signed)
Consultation Note Date: 08/08/2021   Patient Name: [name] [name]  DOB: 20-Feb-1961  MRN: [id number]  Age / Sex: 61 y.o., female  PCP: Leeroy Cha, MD Referring Physician: Eugenie Filler, MD  Reason for Consultation: Establishing goals of care  HPI/Patient Profile: 61 y.o. female  with past medical history of failure to thrive, dysphagia secondary to moderate esophageal dysmotility (barium swallow March 2023), psoriasis arthritis, CAD status post CABG, DM-2, and chronic systolic CHF LVEF 62% admitted on 08/05/2021 with AMS and fever.  Patient recently hospitalized with pneumonia and was discharged to a nursing home.  Patient has lost about 13 kg in the last 5 months per chart review.  Family reports several choking episodes at rehab.  This admission, patient diagnosed with severe sepsis secondary to ESBL E. coli bacteremia and aspiration pneumonia.  Patient with albumin less than 1.5 and ongoing severe protein calorie malnutrition.  PMT consulted to discuss goals of care.  Clinical Assessment and Goals of Care: I have reviewed medical records including EPIC notes, labs and imaging, received report from RN, assessed the patient and then met with patient to discuss diagnosis prognosis, GOC, EOL wishes, disposition and options.  I introduced Palliative Medicine as specialized medical care for people living with serious illness. It focuses on providing relief from the symptoms and stress of a serious illness. The goal is to improve quality of life for both the patient and the family.  Interaction with patient somewhat limited.  She will make eye contact with me briefly but then either closes her eyes or turns away.  She answers questions inconsistently and often in response to her question she moans.  When asked about pain she denied it.  She is only able to tell me that she has bothered by the tube in her nose.  We discussed this is her means  of nutrition at this point.  I attempted to ask her about her baseline status but she is unable to provide much information.  She did state she was using a wheelchair at rehab but could not tell me how long she had been in rehab.  She also told me when she was 9 rehab she was living with her son.  During our conversation [name] is has a weak cough with copious secretions I frequently need to suction out of oral cavity.  I attempted to ask about decision makers for her and she tells me either one of her sons can make decisions for her.  When I ask if she understands her current situation she tells me yes but then cannot explain the situation.  I attempted to explain clinical course to her but it did not seem that she took and much of the conversation.  I attempted to call both of patient's sons-Ronald did not answer and I was unable to leave voicemail, [name] did answer but was unable to have conversation and asked that I call back tomorrow.  Primary Decision Maker NEXT OF KIN    SUMMARY OF RECOMMENDATIONS   Patient's mental status precludes full goals of care conversations, son is unable to speak today  plan for follow-up conversations with patient and sons tomorrow  Code Status/Advance Care Planning: Full code      Primary Diagnoses: Present on Admission:  Sepsis ([place])  Adult failure to thrive syndrome  Rheumatoid arthritis ([place])  Pressure injury of skin  Hypernatremia  Malnutrition of moderate degree  Gastroesophageal reflux disease without esophagitis  AKI (acute kidney injury) ([place])  AMS (  altered mental status)  Chronic systolic CHF (congestive heart failure) (HCC)  Hypophosphatemia  Hypokalemia   I have reviewed the medical record, interviewed the patient and family, and examined the patient. The following aspects are pertinent.  Past Medical History:  Diagnosis Date   Acute ST elevation myocardial infarction (STEMI) involving right coronary artery ([place])  10/04/2015   x 2   Anxiety    Anxiety and depression    CAD (coronary artery disease)    a. CABG 06/2015: LIMA-LAD, SVG-RCA, SVG-OM2 b. STEMI s/p PCI to distal RCA lesion with a small Promus Premier stent. ( patent LIMA to the LAD, occluded SVG to OM, occluded SVG to RCA.)   CAD (coronary artery disease) of artery bypass graft; occluded SVG to RCA and occluded SVG to OM 10/08/2015   Empty sella (HCC)    GERD (gastroesophageal reflux disease)    Headache    from stress   Hematuria wed 06-13-2020   Hidradenitis axillaris    History of kidney stones    Hyperlipemia    Hypertension    Lupus ([place]) dx'd 2008   "they think I have this; have to get retested" (06/12/2015)   Obesity (BMI 30.0-34.9)    Pneumonia X 2   last time yrs ago   PONV (postoperative nausea and vomiting)    likes iv med or scopolamine patch works well   Psoriasis 06/15/2020   hands elbows, ears, small amount on scalp and bridge of nose   Rheumatoid aortitis    psorriatic arthritis   Rheumatoid arthritis ([place])    S/P angioplasty with stent; 10/04/15 to distal RCA lesion. with Promus premier. 10/08/2015   Scoliosis    Sleep apnea    patient states "it is mild" no cpap needed   Stroke [facility]) March 2014   left MCA branches; "they say I have them all the time", denies residual on 06/12/2015   Tobacco abuse    Type II diabetes mellitus ([place])    Urinary frequency    and urgency   Wears glasses    for reading   Social History   Socioeconomic History   Marital status: Widowed    Spouse name: Not on file   Number of children: 2   Years of education: college   Highest education level: Some college, no degree  Occupational History   Occupation: Disability  Tobacco Use   Smoking status: Former    Packs/day: 0.50    Years: 36.00    Pack years: 18.00    Types: Cigarettes    Quit date: 06/12/2014    Years since quitting: 7.1   Smokeless tobacco: Never  Vaping Use   Vaping Use: Never used  Substance and Sexual  Activity   Alcohol use: No    Alcohol/week: 0.0 standard drinks   Drug use: No   Sexual activity: Not Currently  Other Topics Concern   Not on file  Social History Narrative   Patient currently lives at home with her disabled husband who was shot in 2009 and he is total care. She lives with her son and daughter and there is an 8 that comes in. Patient currently is on disability   She occasionally drinks caffeine.    Social Determinants of Health   Financial Resource Strain: Not on file  Food Insecurity: Food Insecurity Present   Worried About [place] in the Last Year: Sometimes true   Ran Out of Food in the Last Year: Sometimes true  Transportation Needs: Unmet  Data processing manager (Medical): No   Lack of Transportation (Non-Medical): Yes  Physical Activity: Not on file  Stress: Not on file  Social Connections: Not on file   Family History  Problem Relation Age of Onset   Diabetes Mother    Heart failure Mother    Breast cancer Mother        85s   Stroke Maternal Aunt    Depression Son    Diabetes Other        maternal   Breast cancer Other        2 diff great maternal aunts -  both 86s   Alzheimer's disease Other        maternal   Scheduled Meds:  Chlorhexidine Gluconate Cloth  6 each Topical Q0600   feeding supplement (OSMOLITE 1.5 CAL)  1,000 mL Per Tube Q24H   feeding supplement (PROSource TF)  45 mL Per Tube Daily   free water  175 mL Per Tube Q4H   heparin  5,000 Units Subcutaneous Q8H   metoprolol tartrate  2.5 mg Intravenous Q8H   mupirocin ointment  1 application. Nasal BID   pantoprazole (PROTONIX) IV  40 mg Intravenous Q24H   Continuous Infusions:  dextrose 5 % and 0.45% NaCl 100 mL/hr at 08/08/21 1259   meropenem (MERREM) IV 1 g (08/08/21 1530)   PRN Meds:.acetaminophen, bisacodyl, HYDROmorphone (DILAUDID) injection, ondansetron **OR** ondansetron (ZOFRAN) IV Allergies  Allergen Reactions   Apremilast Other (See  Comments)    unknown   Latex Itching, Swelling and Rash   Review of Systems  Unable to perform ROS: Mental status change   Physical Exam Constitutional:      General: She is not in acute distress.    Appearance: She is ill-appearing.     Comments: Mental status waxes/wanes - periods of being alert  Pulmonary:     Effort: Pulmonary effort is normal.  Skin:    General: Skin is warm and dry.  Neurological:     Comments: Patient does not answer orientation questions - orientation unclear    Vital Signs: BP (!) 145/88 (BP Location: Right Arm)   Pulse 79   Temp 99.4 F (37.4 C) (Oral)   Resp 20   SpO2 96%  Pain Scale: 0-10 POSS *See Group Information*: 1-Acceptable,Awake and alert Pain Score: Asleep   SpO2: SpO2: 96 % O2 Device:SpO2: 96 % O2 Flow Rate: .O2 Flow Rate (L/min): 3 L/min  IO: Intake/output summary:  Intake/Output Summary (Last 24 hours) at 08/08/2021 1629 Last data filed at 08/08/2021 0800 Gross per 24 hour  Intake 2093.84 ml  Output 400 ml  Net 1693.84 ml    LBM:   Baseline Weight:   Most recent weight:       Palliative Assessment/Data: PPS 30%    *Please note that this is a verbal dictation therefore any spelling or grammatical errors are due to the "[place]" system interpretation.  Juel Burrow, DNP, AGNP-C Palliative Medicine Team [phone] Pager: [phone]

## 2021-08-08 NOTE — Progress Notes (Signed)
Speech Language Pathology Treatment: Dysphagia  Patient Details Name: [name] [name] MRN: [id number] DOB: January 24, 1961 Today's Date: 08/08/2021 Time: 2924-4628 SLP Time Calculation (min) (ACUTE ONLY): 9 min  Assessment / Plan / Recommendation Clinical Impression  Pt's voice is less wet at baseline today, but she has thick secretions sitting in her mouth and is hesitant to allow oral care for removal. She is sensitive and guarded during attempts, but when asked about pain, only says that she has a sore throat. After oral care, SLP provided ice chips, with pt showing reduced awareness compared to last visit from this SLP. Today she tells me that the ice chip is sitting on her tongue when it is nowhere to be found in her oral cavity, and conversely tells me that she has nothing in her mouth when a larger piece of ice is sitting on the middle of her tongue. [place] was used throughout the session, still removing thick secretions that she was coughing up into the back of her throat. Recommend that she remain NPO except for a piece of two of ice after oral care for ongoing attempts at secretion management. Note that palliative care has been consulted.   HPI HPI: Patient is a 61 y.o. female who presented to ED from SNF with AMS, hypoxia, fever. Dx sepsis. PMH: FTT, esophageal dysmotility (esophagram March 2023), DM, RA with chronic pain, HTN, HL, CAD s/p CABG. Evaluated by SLP during Feb 2023 admission; primary issue was mentation. She was D/Cd on a dysphagia 3 diet with thin liquids with no concerns for aspiration.  Subsequent admission two weeks ago for pna; pt d/cd back to SNF on pureed diet.  Pt has had choking episodes while eating per chart review.      SLP Plan  Continue with current plan of care      Recommendations for follow up therapy are one component of a multi-disciplinary discharge planning process, led by the attending physician.  Recommendations may be updated based on patient status,  additional functional criteria and insurance authorization.    Recommendations  Diet recommendations: NPO (few pieces of ice after oral care) Medication Administration: Via alternative means                Oral Care Recommendations: Oral care prior to ice chip/H20;Oral care QID Follow Up Recommendations: Skilled nursing-short term rehab (<3 hours/day) Assistance recommended at discharge: Frequent or constant Supervision/Assistance SLP Visit Diagnosis: Dysphagia, unspecified (R13.10);Dysphagia, oral phase (R13.11) Plan: Continue with current plan of care           Osie Bond., M.A. [place] Office [phone]  Secure chat preferred   08/08/2021, 12:20 PM

## 2021-08-09 DIAGNOSIS — Z7189 Other specified counseling: Secondary | ICD-10-CM | POA: Diagnosis not present

## 2021-08-09 DIAGNOSIS — E538 Deficiency of other specified B group vitamins: Secondary | ICD-10-CM | POA: Diagnosis present

## 2021-08-09 DIAGNOSIS — R627 Adult failure to thrive: Secondary | ICD-10-CM | POA: Diagnosis not present

## 2021-08-09 DIAGNOSIS — A419 Sepsis, unspecified organism: Secondary | ICD-10-CM | POA: Diagnosis not present

## 2021-08-09 DIAGNOSIS — N179 Acute kidney failure, unspecified: Secondary | ICD-10-CM | POA: Diagnosis not present

## 2021-08-09 DIAGNOSIS — R4182 Altered mental status, unspecified: Secondary | ICD-10-CM | POA: Diagnosis not present

## 2021-08-09 LAB — IRON AND TIBC: Iron: 19 ug/dL — ABNORMAL LOW (ref 28–170)

## 2021-08-09 LAB — FERRITIN: Ferritin: 1850 ng/mL — ABNORMAL HIGH (ref 11–307)

## 2021-08-09 LAB — CBC
HCT: 36.4 % (ref 36.0–46.0)
Hemoglobin: 11.6 g/dL — ABNORMAL LOW (ref 12.0–15.0)
MCH: 30.1 pg (ref 26.0–34.0)
MCHC: 31.9 g/dL (ref 30.0–36.0)
MCV: 94.5 fL (ref 80.0–100.0)
Platelets: 124 10*3/uL — ABNORMAL LOW (ref 150–400)
RBC: 3.85 MIL/uL — ABNORMAL LOW (ref 3.87–5.11)
RDW: 16.4 % — ABNORMAL HIGH (ref 11.5–15.5)
WBC: 5.6 10*3/uL (ref 4.0–10.5)
nRBC: 0 % (ref 0.0–0.2)

## 2021-08-09 LAB — FOLATE: Folate: 4 ng/mL — ABNORMAL LOW (ref >5.9)

## 2021-08-09 LAB — GLUCOSE, CAPILLARY
Glucose-Capillary: 135 mg/dL — ABNORMAL HIGH (ref 70–99)
Glucose-Capillary: 159 mg/dL — ABNORMAL HIGH (ref 70–99)
Glucose-Capillary: 175 mg/dL — ABNORMAL HIGH (ref 70–99)
Glucose-Capillary: 195 mg/dL — ABNORMAL HIGH (ref 70–99)
Glucose-Capillary: 198 mg/dL — ABNORMAL HIGH (ref 70–99)
Glucose-Capillary: 228 mg/dL — ABNORMAL HIGH (ref 70–99)

## 2021-08-09 LAB — RENAL FUNCTION PANEL
Albumin: 1.7 g/dL — ABNORMAL LOW (ref 3.5–5.0)
Anion gap: 5 (ref 5–15)
BUN: 8 mg/dL (ref 8–23)
CO2: 22 mmol/L (ref 22–32)
Calcium: 7.5 mg/dL — ABNORMAL LOW (ref 8.9–10.3)
Chloride: 112 mmol/L — ABNORMAL HIGH (ref 98–111)
Creatinine, Ser: 0.51 mg/dL (ref 0.44–1.00)
GFR, Estimated: >60 mL/min (ref >60)
Glucose, Bld: 233 mg/dL — ABNORMAL HIGH (ref 70–99)
Phosphorus: 1.3 mg/dL — ABNORMAL LOW (ref 2.5–4.6)
Potassium: 4 mmol/L (ref 3.5–5.1)
Sodium: 139 mmol/L (ref 135–145)

## 2021-08-09 LAB — VITAMIN B12: Vitamin B-12: 421 pg/mL (ref 180–914)

## 2021-08-09 LAB — RETICULOCYTES
Immature Retic Fract: 10.1 % (ref 2.3–15.9)
RBC.: 3.92 MIL/uL (ref 3.87–5.11)
Retic Count, Absolute: 18 10*3/uL — ABNORMAL LOW (ref 19.0–186.0)
Retic Ct Pct: 0.5 % (ref 0.4–3.1)

## 2021-08-09 LAB — MAGNESIUM: Magnesium: 1.7 mg/dL (ref 1.7–2.4)

## 2021-08-09 LAB — PHOSPHORUS: Phosphorus: 1.3 mg/dL — ABNORMAL LOW (ref 2.5–4.6)

## 2021-08-09 MED ORDER — POTASSIUM PHOSPHATES 15 MMOLE/5ML IV SOLN
30.0000 mmol | Freq: Once | INTRAVENOUS | Status: AC
  Administered 2021-08-09: 30 mmol via INTRAVENOUS
  Filled 2021-08-09: qty 10

## 2021-08-09 MED ORDER — MAGNESIUM SULFATE 4 GM/100ML IV SOLN
4.0000 g | Freq: Once | INTRAVENOUS | Status: AC
  Administered 2021-08-09: 4 g via INTRAVENOUS
  Filled 2021-08-09: qty 100

## 2021-08-09 MED ORDER — FOLIC ACID 1 MG PO TABS
1.0000 mg | ORAL_TABLET | Freq: Every day | ORAL | Status: DC
  Administered 2021-08-09 – 2021-08-26 (×18): 1 mg
  Filled 2021-08-09 (×18): qty 1

## 2021-08-09 MED ORDER — SODIUM CHLORIDE 0.45 % IV SOLN: INTRAVENOUS | Status: DC

## 2021-08-09 NOTE — Progress Notes (Signed)
Inpatient Diabetes Program Recommendations  AACE/ADA: New Consensus Statement on Inpatient Glycemic Control (2015)  Target Ranges:  Prepandial:   less than 140 mg/dL      Peak postprandial:   less than 180 mg/dL (1-2 hours)      Critically ill patients:  140 - 180 mg/dL    Latest Reference Range & Units 08/07/21 23:50 08/08/21 04:09 08/08/21 07:26 08/08/21 12:13 08/08/21 20:48  Glucose-Capillary 70 - 99 mg/dL 98 141 (H) 121 (H) 148 (H) 141 (H)  (H): Data is abnormally high  Latest Reference Range & Units 08/09/21 06:45  Glucose-Capillary 70 - 99 mg/dL 228 (H)  (H): Data is abnormally high     History: DM2  Home DM Meds: Metformin 500 mg BID  Current Orders: CBG checks Q4 hours    MD- Note patient started on Osmolite tube feeds 55cc/hr yesterday at 5pm  CBG 228 this AM  Please consider starting Novolog 0-9 units Q4 hours (sensitive scale)    --Will follow patient during hospitalization--  Wyn Quaker RN, MSN, CDE Diabetes Coordinator Inpatient Glycemic Control Team Team Pager: [phone] (8a-5p)

## 2021-08-09 NOTE — Progress Notes (Signed)
PROGRESS NOTE    [name]  STM:[id number] DOB: April 28, 1960 DOA: 08/05/2021 PCP: Leeroy Cha, MD    Chief Complaint  Patient presents with   Altered Mental Status    Brief Narrative:  HPI per Dr. Wynetta Fines on 08/05/21 [name] [name] is a 61 y.o. female with medical history significant of failure to thrive, dysphagia secondary to moderate esophageal dysmotility (barium swallow March 2023), psoriasis arthritis, CAD status post CABG, DM-2, chronic systolic CHF LVEF 89%, was sent to hospital for evaluation of altered mentations hypoxia and fever.   Patient was recently hospitalized and discharged to nursing home 11 days ago after treatment of supposed empyema and pneumonia.  Patient has established history of failure to thrive and has been losing weight, and according to the record she lost about 13 kg in last 37-month.    Patient unable to provide any history as she is confused and agitated.  All history provided by patient and [name]([phone], and patient was discharged on soft diet, last week, her diet was downgraded to pured.  Despite, family observed several times patient had choking episode after eating for food.  This morning, family was contacted about patient had after mentations and hypoxia   ED Course: Patient was found hypoxic initially briefly on 15 L of nonrebreather and titrated down to 5 L, tachycardia hypotensive but responded to IV fluid.  Febrile 101.4   Angiogram negative for PE but patchy infiltrates on bilateral lower lung fields suspicious for pneumonia.  Small right-sided pleural effusion.   Patient was started on vancomycin and cefepime.  Sodium 149, K5.2, creatinine 1.5, WBC 12.2.   **Interim History Patient feels short of breath but oxygen requirement has been weaned from 5 L to 3 L.  Still having difficulty swallowing and managing secretions so she is made n.p.o.  Discussed with daughter about a small bore feeding tube and she may be agreeable  for tomorrow.  She is found to have an ESBL E. coli bacteremia and is now on IV meropenem     Assessment & Plan:   Principal Problem:   Sepsis ([facility] Active Problems:   Rheumatoid arthritis ([facility]   Gastroesophageal reflux disease without esophagitis   AMS (altered mental status)   Hypokalemia   Hypernatremia   Pressure injury of skin   Malnutrition of moderate degree   AKI (acute kidney injury) ([facility]   Adult failure to thrive syndrome   Dysphagia   Chronic systolic CHF (congestive heart failure) (HCC)   Infection due to ESBL-producing Escherichia coli   Hypophosphatemia   Folate deficiency  #1 severe sepsis secondary to ESBL E. coli bacteremia, aspiration pneumonia, possible UTI/acute respiratory failure with hypoxia/lactic acidosis. -Patient noted to have met criteria for sepsis with tachycardia, new onset hypoxia, fever, suspected source noted to be pneumonia, bacteremia UTI.  Patient noted with signs of endorgan damage of AKI and acute encephalopathy. -Improving clinically. -Patient with history of aspiration and radiologic pattern of pneumonia with concern for aspiration pneumonia. -MRSA PCR positive. -Patient was on IV vancomycin, IV cefepime and IV cefepime changed to IV Merrem due to ESBL E. coli. -Patient seen by speech therapy and made n.p.o. due to increased risk of aspiration and difficulty with clearing secretions. -Leukocytosis trending down. -Procalcitonin noted to be elevated but not repeated this morning. -Blood cultures done positive for ESBL E. coli. -Patient with poor nutritional status, currently n.p.o., likely needs feeding tube in the future patient cannot meet calorie intake treatment with recurrent episodes of aspiration  despite being placed on a dysphagia 1 diet. -Feeding tube ordered. -Patient seen in consultation by ID and IV vancomycin has been discontinued and recommending continuation of Merrem to treat for total of 7 days. -Supportive care..  2.   Failure to thrive/dysphagia/severe protein calorie malnutrition -Patient noted to be cachectic, with some temporal wasting, failure to thrive. -Patient with esophageal dysmotility. -Patient noted to have had a swallow evaluation in March noting moderate dysmotility concern for inadequate nutrition as albumin noted to < 1.5. -Cortrack ordered and placed 08/07/2021. -Consulted with dietitian to start tube feeds. -Status post IV albumin every 6 hours x24 hours.  -Status post course of fluconazole for Candida esophagitis during last hospitalization. -Palliative care consultation for goals of care pending. -We will consult with IR for PEG tube placement after palliative care consultation completed if patient desires.  3.  Acute kidney injury -Likely secondary to prerenal azotemia in the setting of severe dehydration, volume contraction. -Patient noted to have been on half-normal saline which was changed to D5W. -Renal function improved with hydration. -Avoid nephrotoxins. -Change IV fluids to half-normal saline.  -Follow.  4.  Hypernatremia -Secondary to dehydration, failure to thrive, volume depletion.  -Improved initially on D5W, subsequently on D5 half-normal saline with resolution of hypernatremia with sodium at 139 this morning.  -Change IV fluids to half-normal saline -Follow.  5.  UTI with hematuria versus bacteriuria -Is noted to be turbid, moderate leukocytes, nitrite negative, 21-50 WBCs, WBC clumps noted, many bacteria. -Urine cultures sent 08/05/2021 with results still pending. -On IV Merrem.  6.  Hyperkalemia>>>> hypokalemia/hypophosphatemia -Likely secondary to acute kidney injury. -Hyperkalemia improved currently resolved patient now hypokalemic and potassium repleted currently at 4.0.   -Phosphorus at 1.3.   -K-Phos 30 mmol IV x1.   -Repeat labs in the AM.  7.  Acute metabolic encephalopathy -Likely secondary to sepsis/acute infection. -Clinical improvement.    -Continue IV antibiotics, tube feeds.   -Follow.  8.  Hypertension -Patient currently n.p.o. with esophageal dysmotility. -Blood pressure currently controlled on IV Lopressor 2.5 mg every 8 hours.  9.  Chronic systolic CHF -Patient dehydrated, volume contracted and also with hypoxia from pneumonia. -Continue gentle hydration and change IV fluids from D5 half-normal saline to normal saline.   -No signs of acute decompensation  -Follow.  10.  Psoriasis arthritis -No clear etiology of esophageal dysmotility, unsure whether related to psoriatic arthritis which is likely poorly controlled. -We will need outpatient follow-up with rheumatology.  11.  Thrombocytopenia -In the setting of sepsis. -Improved.  12.  GERD -IV PPI.    13.  Anemia./Folate deficiency -Patient with no overt bleeding. -Likely dilutional component. -Anemia panel with iron level of 19, folate of 4.0, ferritin at 1850, TIBC not calculated.  Vitamin B12 levels of 421.  -H&H stable at 76.5.  -Place on folic acid 1 mg daily.  -Transfusion threshold hemoglobin < 7.   14.  Pressure injury, POA Pressure Injury 08/05/21 Buttocks Right Stage 2 -  Partial thickness loss of dermis presenting as a shallow open injury with a red, pink wound bed without slough. red, no drainage (Active)  08/05/21 1835  Location: Buttocks  Location Orientation: Right  Staging: Stage 2 -  Partial thickness loss of dermis presenting as a shallow open injury with a red, pink wound bed without slough.  Wound Description (Comments): red, no drainage  Present on Admission: Yes     Pressure Injury 08/05/21 Hip Right Stage 2 -  Partial thickness loss of dermis presenting  as a shallow open injury with a red, pink wound bed without slough. (Active)  08/05/21 1837  Location: Hip  Location Orientation: Right  Staging: Stage 2 -  Partial thickness loss of dermis presenting as a shallow open injury with a red, pink wound bed without slough.  Wound  Description (Comments):   Present on Admission: Yes         DVT prophylaxis: Heparin Code Status: Full Family Communication: Updated patient.  No family at bedside. Disposition: Likely back to SNF.  Status is: Inpatient Remains inpatient appropriate because: Severity of illness.   Consultants:  ID: Dr. Juleen China 08/07/2021 [place] Palliative care pending  Procedures:  CT angiogram chest 08/05/2021 Chest x-ray 08/05/2021, 08/06/2021 Abdominal films 08/07/2021 Cortrack placement 08/07/2021   Antimicrobials:  IV cefepime 08/05/2021>>>> 08/06/2021 IV Merrem 08/06/2021>>>> 08/13/2021 IV vancomycin 08/05/2021>>>> 08/07/2021   Subjective: Sitting up in bed.  Alert and oriented to self place and time.  Overall stating feeling a little bit better. Cortrack in place.  No chest pain.  No shortness of breath.  Objective: Vitals:   08/08/21 1640 08/08/21 2045 08/08/21 2137 08/09/21 0548  BP: 140/82 134/80  132/84  Pulse:  80  75  Resp: 20 (!) 28 (!) 22 20  Temp:  99 F (37.2 C)    TempSrc:  Oral    SpO2:  100%  100%    Intake/Output Summary (Last 24 hours) at 08/09/2021 1203 Last data filed at 08/09/2021 0915 Gross per 24 hour  Intake 2505.12 ml  Output 200 ml  Net 2305.12 ml    There were no vitals filed for this visit.  Examination:  General exam: Frail.  Cachectic.  Some temporal wasting. Cortrack in place. Respiratory system: CTA B anterior lung fields.  No wheezes, no crackles, no rhonchi.  Fair air movement.  Speaking in full sentences.   Cardiovascular system: RRR no murmurs rubs or gallops.  No JVD.  No lower extremity edema.  Gastrointestinal system: Abdomen is soft, nontender, nondistended, positive bowel sounds.  No rebound.  No guarding.  Central nervous system: Alert and oriented x3. No focal neurological deficits. Extremities: Symmetric 5 x 5 power. Skin: No rashes, lesions or ulcers Psychiatry: Judgement and insight appear normal. Mood & affect appropriate.      Data Reviewed: I have personally reviewed following labs and imaging studies  CBC: Recent Labs  Lab 08/05/21 1107 08/05/21 1153 08/06/21 0011 08/07/21 0409 08/08/21 0416 08/09/21 0638  WBC 12.2*  --  15.8* 6.6 7.4 5.6  NEUTROABS 10.9*  --   --  5.6  --   --   HGB 13.7 11.6* 12.1 13.2 10.1* 11.6*  HCT 44.4 34.0* 38.2 43.3 31.9* 36.4  MCV 95.5  --  96.5 97.3 96.1 94.5  PLT 179  --  140* 163 148* 124*     Basic Metabolic Panel: Recent Labs  Lab 08/05/21 1107 08/05/21 1153 08/06/21 0011 08/07/21 0409 08/07/21 1826 08/08/21 0416 08/08/21 1515 08/09/21 0638  NA 149* 151* 149* 149*  --  141  --  139  K 5.2* 3.9 3.7 3.8  --  3.0*  --  4.0  CL 117*  --  119* 117*  --  113*  --  112*  CO2 23  --  25 26  --  25  --  22  GLUCOSE 129*  --  132* 63*  --  150*  --  233*  BUN 31*  --  29* 21  --  14  --  8  CREATININE 1.54*  --  1.17* 0.91  --  0.71  --  0.51  CALCIUM 8.9  --  8.3* 8.4*  --  8.1*  --  7.5*  MG  --   --   --  2.2 1.9 1.9 1.8 1.7  PHOS  --   --   --  2.5 1.8* 1.6* 2.6 1.3*  1.3*     GFR: Estimated Creatinine Clearance: 68.3 mL/min (by C-G formula based on SCr of 0.51 mg/dL).  Liver Function Tests: Recent Labs  Lab 08/05/21 1107 08/07/21 0409 08/08/21 0416 08/09/21 0638  AST 75* 50*  --   --   ALT 21 17  --   --   ALKPHOS 223* 169*  --   --   BILITOT 1.2 0.3  --   --   PROT 7.1 6.6  --   --   ALBUMIN 1.5* <1.5* 2.1* 1.7*     CBG: Recent Labs  Lab 08/08/21 1213 08/08/21 2048 08/09/21 0148 08/09/21 0645 08/09/21 1200  GLUCAP 148* 141* 198* 228* 195*      Recent Results (from the past 240 hour(s))  Resp Panel by RT-PCR (Flu A&B, Covid) Nasopharyngeal Swab     Status: None   Collection Time: 08/05/21 10:46 AM   Specimen: Nasopharyngeal Swab; Nasopharyngeal(NP) swabs in vial transport medium  Result Value Ref Range Status   SARS Coronavirus 2 by RT PCR NEGATIVE NEGATIVE Final    Comment: (NOTE) SARS-CoV-2 target nucleic acids are  NOT DETECTED.  The SARS-CoV-2 RNA is generally detectable in upper respiratory specimens during the acute phase of infection. The lowest concentration of SARS-CoV-2 viral copies this assay can detect is 138 copies/mL. A negative result does not preclude SARS-Cov-2 infection and should not be used as the sole basis for treatment or other patient management decisions. A negative result may occur with  improper specimen collection/handling, submission of specimen other than nasopharyngeal swab, presence of viral mutation(s) within the areas targeted by this assay, and inadequate number of viral copies(<138 copies/mL). A negative result must be combined with clinical observations, patient history, and epidemiological information. The expected result is Negative.  Fact Sheet for Patients:  EntrepreneurPulse.com.au  Fact Sheet for Healthcare Providers:  IncredibleEmployment.be  This test is no t yet approved or cleared by the [place] FDA and  has been authorized for detection and/or diagnosis of SARS-CoV-2 by FDA under an Emergency Use Authorization (EUA). This EUA will remain  in effect (meaning this test can be used) for the duration of the COVID-19 declaration under Section 564(b)(1) of the Act, 21 U.S.C.section 360bbb-3(b)(1), unless the authorization is terminated  or revoked sooner.       Influenza A by PCR NEGATIVE NEGATIVE Final   Influenza B by PCR NEGATIVE NEGATIVE Final    Comment: (NOTE) The Xpert Xpress SARS-CoV-2/FLU/RSV plus assay is intended as an aid in the diagnosis of influenza from Nasopharyngeal swab specimens and should not be used as a sole basis for treatment. Nasal washings and aspirates are unacceptable for Xpert Xpress SARS-CoV-2/FLU/RSV testing.  Fact Sheet for Patients: EntrepreneurPulse.com.au  Fact Sheet for Healthcare Providers: IncredibleEmployment.be  This test is not  yet approved or cleared by the [place] FDA and has been authorized for detection and/or diagnosis of SARS-CoV-2 by FDA under an Emergency Use Authorization (EUA). This EUA will remain in effect (meaning this test can be used) for the duration of the COVID-19 declaration under Section 564(b)(1) of the Act, 21 U.S.C. section 360bbb-3(b)(1), unless the  authorization is terminated or revoked.  Performed at [facility], [place] [address], [place], [place] [address]   Blood Culture (routine x 2)     Status: Abnormal   Collection Time: 08/05/21 10:46 AM   Specimen: BLOOD  Result Value Ref Range Status   Specimen Description BLOOD BLOOD RIGHT WRIST  Final   Special Requests   Final    BOTTLES DRAWN AEROBIC AND ANAEROBIC Blood Culture adequate volume   Culture  Setup Time   Final    GRAM NEGATIVE RODS IN BOTH AEROBIC AND ANAEROBIC BOTTLES CRITICAL VALUE NOTED.  VALUE IS CONSISTENT WITH PREVIOUSLY REPORTED AND CALLED VALUE.    Culture (A)  Final    ESCHERICHIA COLI SUSCEPTIBILITIES PERFORMED ON PREVIOUS CULTURE WITHIN THE LAST 5 DAYS. Performed at [facility], [place] [address] [place], [place] [address]    Report Status 08/08/2021 FINAL  Final  Blood Culture (routine x 2)     Status: Abnormal   Collection Time: 08/05/21 10:51 AM   Specimen: BLOOD  Result Value Ref Range Status   Specimen Description BLOOD BLOOD LEFT WRIST  Final   Special Requests   Final    BOTTLES DRAWN AEROBIC AND ANAEROBIC Blood Culture adequate volume   Culture  Setup Time   Final    GRAM NEGATIVE RODS ANAEROBIC BOTTLE ONLY CRITICAL RESULT CALLED TO, READ BACK BY AND VERIFIED WITH: PHARMD ADRIENNE WELLBORN 08/06/21@1 :41 BY TW IN BOTH AEROBIC AND ANAEROBIC BOTTLES Performed at [facility], [place] [address], [place], [place] [address]    Culture (A)  Final    ESCHERICHIA COLI Confirmed Extended Spectrum Beta-Lactamase Producer (ESBL).  In bloodstream infections from ESBL organisms,  carbapenems are preferred over piperacillin/tazobactam. They are shown to have a lower risk of mortality.    Report Status 08/08/2021 FINAL  Final   Organism ID, Bacteria ESCHERICHIA COLI  Final      Susceptibility   Escherichia coli - MIC*    AMPICILLIN >=32 RESISTANT Resistant     CEFAZOLIN >=64 RESISTANT Resistant     CEFEPIME >=32 RESISTANT Resistant     CEFTAZIDIME >=64 RESISTANT Resistant     CEFTRIAXONE >=64 RESISTANT Resistant     CIPROFLOXACIN >=4 RESISTANT Resistant     GENTAMICIN <=1 SENSITIVE Sensitive     IMIPENEM <=0.25 SENSITIVE Sensitive     TRIMETH/SULFA >=320 RESISTANT Resistant     AMPICILLIN/SULBACTAM >=32 RESISTANT Resistant     PIP/TAZO 16 SENSITIVE Sensitive     * ESCHERICHIA COLI  Blood Culture ID Panel (Reflexed)     Status: Abnormal   Collection Time: 08/05/21 10:51 AM  Result Value Ref Range Status   Enterococcus faecalis NOT DETECTED NOT DETECTED Final   Enterococcus Faecium NOT DETECTED NOT DETECTED Final   Listeria monocytogenes NOT DETECTED NOT DETECTED Final   Staphylococcus species NOT DETECTED NOT DETECTED Final   Staphylococcus aureus (BCID) NOT DETECTED NOT DETECTED Final   Staphylococcus epidermidis NOT DETECTED NOT DETECTED Final   Staphylococcus lugdunensis NOT DETECTED NOT DETECTED Final   Streptococcus species NOT DETECTED NOT DETECTED Final   Streptococcus agalactiae NOT DETECTED NOT DETECTED Final   Streptococcus pneumoniae NOT DETECTED NOT DETECTED Final   Streptococcus pyogenes NOT DETECTED NOT DETECTED Final   A.calcoaceticus-baumannii NOT DETECTED NOT DETECTED Final   Bacteroides fragilis NOT DETECTED NOT DETECTED Final   Enterobacterales DETECTED (A) NOT DETECTED Final    Comment: Enterobacterales represent a large order of gram negative bacteria, not a single organism. CRITICAL  RESULT CALLED TO, READ BACK BY AND VERIFIED WITH: PHARMD ADRIENNE WELLBORN 08/06/21@1 :41 BY TW    Enterobacter cloacae complex NOT DETECTED NOT DETECTED  Final   Escherichia coli DETECTED (A) NOT DETECTED Final    Comment: CRITICAL RESULT CALLED TO, READ BACK BY AND VERIFIED WITH: PHARMD ADRIENNE WELLBORN 08/06/21@1 :41 BY TW    Klebsiella aerogenes NOT DETECTED NOT DETECTED Final   Klebsiella oxytoca NOT DETECTED NOT DETECTED Final   Klebsiella pneumoniae NOT DETECTED NOT DETECTED Final   Proteus species NOT DETECTED NOT DETECTED Final   Salmonella species NOT DETECTED NOT DETECTED Final   Serratia marcescens NOT DETECTED NOT DETECTED Final   Haemophilus influenzae NOT DETECTED NOT DETECTED Final   Neisseria meningitidis NOT DETECTED NOT DETECTED Final   Pseudomonas aeruginosa NOT DETECTED NOT DETECTED Final   Stenotrophomonas maltophilia NOT DETECTED NOT DETECTED Final   Candida albicans NOT DETECTED NOT DETECTED Final   Candida auris NOT DETECTED NOT DETECTED Final   Candida glabrata NOT DETECTED NOT DETECTED Final   Candida krusei NOT DETECTED NOT DETECTED Final   Candida parapsilosis NOT DETECTED NOT DETECTED Final   Candida tropicalis NOT DETECTED NOT DETECTED Final   Cryptococcus neoformans/gattii NOT DETECTED NOT DETECTED Final   CTX-M ESBL DETECTED (A) NOT DETECTED Final    Comment: CRITICAL RESULT CALLED TO, READ BACK BY AND VERIFIED WITH: PHARMD ADRIENNE WELLBORN 08/06/21@1 :41 BY TW (NOTE) Extended spectrum beta-lactamase detected. Recommend a carbapenem as initial therapy.      Carbapenem resistance IMP NOT DETECTED NOT DETECTED Final   Carbapenem resistance KPC NOT DETECTED NOT DETECTED Final   Carbapenem resistance NDM NOT DETECTED NOT DETECTED Final   Carbapenem resist OXA 48 LIKE NOT DETECTED NOT DETECTED Final   Carbapenem resistance VIM NOT DETECTED NOT DETECTED Final    Comment: Performed at [facility], [place] [address] [place], [place] [address]  MRSA Next Gen by PCR, Nasal     Status: Abnormal   Collection Time: 08/06/21  3:00 AM   Specimen: Nasal Mucosa; Nasal Swab  Result Value Ref Range Status    MRSA by PCR Next Gen DETECTED (A) NOT DETECTED Final    Comment: RESULT CALLED TO, READ BACK BY AND VERIFIED WITH: RN Heloise Ochoa 08/06/21@4 :41 BY TW (NOTE) The GeneXpert MRSA Assay (FDA approved for NASAL specimens only), is one component of a comprehensive MRSA colonization surveillance program. It is not intended to diagnose MRSA infection nor to guide or monitor treatment for MRSA infections. Test performance is not FDA approved in patients less than 51 years old. Performed at [facility], [place] [address] [place], [place] [address]           Radiology Studies: DG Abd Portable 1V  Result Date: 08/07/2021 CLINICAL DATA:  Small bore feeding tube placement. EXAM: PORTABLE ABDOMEN - 1 VIEW COMPARISON:  05/06/2021 FINDINGS: A small bore feeding tube is noted with tip overlying the distal stomach. Visualized bowel gas pattern is unremarkable. Cardiomegaly, CABG change and small RIGHT pleural effusion again noted. IMPRESSION: Small bore feeding tube with tip overlying the distal stomach. Electronically Signed   By: Margarette Canada M.D.   On: 08/07/2021 15:15        Scheduled Meds:  Chlorhexidine Gluconate Cloth  6 each Topical Q0600   feeding supplement (OSMOLITE 1.5 CAL)  1,000 mL Per Tube Q24H   feeding supplement (PROSource TF)  45 mL Per Tube Daily   folic acid  1 mg Per Tube Daily   free  water  175 mL Per Tube Q4H   heparin  5,000 Units Subcutaneous Q8H   metoprolol tartrate  2.5 mg Intravenous Q8H   mupirocin ointment  1 application. Nasal BID   pantoprazole (PROTONIX) IV  40 mg Intravenous Q24H   Continuous Infusions:  sodium chloride 100 mL/hr at 08/09/21 0914   magnesium sulfate bolus IVPB 4 g (08/09/21 1012)   meropenem (MERREM) IV 1 g (08/09/21 0559)   potassium PHOSPHATE IVPB (in mmol)       LOS: 4 days    Time spent: 40 minutes    Irine Seal, MD [facility]   To contact the attending provider between 7A-7P or the covering provider during  after hours 7P-7A, please log into the web site [email or web address] and access using universal [place] password for that web site. If you do not have the password, please call the hospital operator.  08/09/2021, 12:03 PM

## 2021-08-09 NOTE — Progress Notes (Signed)
Notified Dr. Grandville Silos of expiring cardiac telemetry orders.  No new orders received.

## 2021-08-09 NOTE — Plan of Care (Signed)

## 2021-08-09 NOTE — Evaluation (Signed)
Physical Therapy Evaluation Patient Details Name: [name] [name] MRN: [id number] DOB: 1960-11-06 Today's Date: 08/09/2021  History of Present Illness  [name] [name] is a 61 y.o. female with medical history significant of failure to thrive, dysphagia secondary to moderate esophageal dysmotility (barium swallow March 2023), psoriasis arthritis, CAD status post CABG, DM-2, chronic systolic CHF LVEF 57%, was sent to hospital for evaluation of altered mentations hypoxia and fever.   Clinical Impression  Patient received in bed, she is alert. Asking about son when I enter room. Patient is very weak and pain limited in all extremities, grimacing in pain with passive movement of UEs and LEs. Patient requires +2 total assist at this time due to pain and weakness. She is found to be soiled in bed, NT notified. Patient will continue to benefit from trial of PT to attempt to improve mobility and strength.          Recommendations for follow up therapy are one component of a multi-disciplinary discharge planning process, led by the attending physician.  Recommendations may be updated based on patient status, additional functional criteria and insurance authorization.  Follow Up Recommendations Skilled nursing-short term rehab (<3 hours/day)    Assistance Recommended at Discharge Frequent or constant Supervision/Assistance  Patient can return home with the following  Two people to help with walking and/or transfers;Two people to help with bathing/dressing/bathroom    Equipment Recommendations None recommended by PT  Recommendations for Other Services       Functional Status Assessment Patient has had a recent decline in their functional status and demonstrates the ability to make significant improvements in function in a reasonable and predictable amount of time.     Precautions / Restrictions Precautions Precautions: Fall Restrictions Weight Bearing Restrictions: No      Mobility  Bed  Mobility Overal bed mobility: Needs Assistance Bed Mobility: Rolling Rolling: Total assist, +2 for physical assistance   Supine to sit: Total assist, +2 for physical assistance Sit to supine: Total assist, +2 for physical assistance   General bed mobility comments: Total assist needed for bed moblity due to pain and weakness    Transfers                   General transfer comment: unable/unsafe to attempt    Ambulation/Gait               General Gait Details: unable to attempt  Stairs            Wheelchair Mobility    Modified Rankin (Stroke Patients Only)       Balance       Sitting balance - Comments: not assessed                                     Pertinent Vitals/Pain Pain Assessment Pain Assessment: Faces Faces Pain Scale: Hurts even more Pain Location: all over ( arms, legs, feet, headache ) Pain Descriptors / Indicators: Discomfort, Headache, Grimacing, Guarding Pain Intervention(s): Monitored during session, Limited activity within patient's tolerance, Repositioned    Home Living Family/patient expects to be discharged to:: [place]: Rollator (4 wheels);BSC/3in1;Rolling Environmental consultant (2 wheels) Additional Comments: patient reports she lives with her son. States she came here from home and that she was unable to get oob prior to this  admission.    Prior Function Prior Level of Function : Needs assist             Mobility Comments: reports she was unable to walk or get out of bed ADLs Comments: needs assist     Hand Dominance   Dominant Hand: Right    Extremity/Trunk Assessment   Upper Extremity Assessment Upper Extremity Assessment: Defer to OT evaluation    Lower Extremity Assessment Lower Extremity Assessment: Generalized weakness RLE Deficits / Details: unable to move LEs against gravity. Can slide R LE a little in bed. Minimal DF. LLE Deficits /  Details: Grossly 2/5 strength       Communication   Communication: No difficulties  Cognition Arousal/Alertness: Awake/alert Behavior During Therapy: Flat affect Overall Cognitive Status: No family/caregiver present to determine baseline cognitive functioning Area of Impairment: Following commands, Awareness, Problem solving                       Following Commands: Follows one step commands inconsistently, Follows one step commands with increased time     Problem Solving: Slow processing, Decreased initiation, Difficulty sequencing, Requires verbal cues, Requires tactile cues General Comments: Patient moaning and painful with any movement of UEs or LEs. Unable to assist with mobility much at all, very weak grip. Generally very weak and uncomfortable        General Comments      Exercises     Assessment/Plan    PT Assessment Patient needs continued PT services  PT Problem List Decreased strength;Decreased mobility;Decreased activity tolerance;Pain       PT Treatment Interventions DME instruction;Therapeutic exercise;Functional mobility training;Therapeutic activities;Patient/family education    PT Goals (Current goals can be found in the Care Plan section)  Acute Rehab PT Goals Patient Stated Goal: none stated Time For Goal Achievement: 08/23/21    Frequency Min 2X/week     Co-evaluation               AM-PAC PT "6 Clicks" Mobility  Outcome Measure Help needed turning from your back to your side while in a flat bed without using bedrails?: Total Help needed moving from lying on your back to sitting on the side of a flat bed without using bedrails?: Total Help needed moving to and from a bed to a chair (including a wheelchair)?: Total Help needed standing up from a chair using your arms (e.g., wheelchair or bedside chair)?: Total Help needed to walk in hospital room?: Total Help needed climbing 3-5 steps with a railing? : Total 6 Click Score: 6     End of Session Equipment Utilized During Treatment: Oxygen Activity Tolerance: Patient limited by pain Patient left: in bed;with call bell/phone within reach Nurse Communication: Mobility status;Other (comment) (patient needs cleaning up) PT Visit Diagnosis: Muscle weakness (generalized) (M62.81);Pain;Other abnormalities of gait and mobility (R26.89) Pain - Right/Left:  (bilateral, all over)    Time: 0865-7846 PT Time Calculation (min) (ACUTE ONLY): 11 min   Charges:   PT Evaluation $PT Eval Moderate Complexity: 1 Mod          Megyn Leng, PT, GCS 08/09/21,2:06 PM

## 2021-08-09 NOTE — TOC Initial Note (Signed)
Transition of Care [facility]) - Initial/Assessment Note    Patient Details  Name: [name] [name] MRN: [id number] Date of Birth: 12/03/1960  [facility] CM/SW Contact:    [name] Chars, LCSW Phone Number: 08/09/2021, 2:45 PM  Clinical Narrative:      Pt oriented x2, CSW spoke with son [name] [name] about PT recommendation for SNF.  Pt was at [facility] prior to Easter of this year, had just returned for 3-4 days to [facility] prior to this admission.  Pt normally lives with [name] [name].  [name] [name] in agreement that pt would return to [place] when ready for DC.  TOC will continue to follow.              Expected Discharge Plan: Skilled Nursing Facility Barriers to Discharge: Continued Medical Work up   Patient Goals and CMS Choice     Choice offered to / list presented to : Adult Children (son [name] [name])  Expected Discharge Plan and Services Expected Discharge Plan: [place] In-house Referral: Clinical Social Work   Post Acute Care Choice: [place] Living arrangements for the past 2 months: [place]                                      Prior Living Arrangements/Services Living arrangements for the past 2 months: Single Family Home Lives with:: Adult Children Patient language and need for interpreter reviewed:: No        Need for Family Participation in Patient Care: Yes (Comment) Care giver support system in place?: Yes (comment) Current home services: Other (comment) (none) Criminal Activity/Legal Involvement Pertinent to Current Situation/Hospitalization: No - Comment as needed  Activities of Daily Living      Permission Sought/Granted                  Emotional Assessment Appearance:: Appears stated age Attitude/Demeanor/Rapport: Unable to Assess Affect (typically observed): Unable to Assess Orientation: : Oriented to Self, Oriented to Place Alcohol / Substance Use: Not Applicable Psych Involvement: No  (comment)  Admission diagnosis:  Hypoxia [R09.02] Sepsis ([place]) [A41.9] Urinary tract infection without hematuria, site unspecified [N39.0] Sepsis, due to unspecified organism, unspecified whether acute organ dysfunction present [facility] [place]) [A41.9] Patient Active Problem List   Diagnosis Date Noted   Folate deficiency 08/09/2021   Hypophosphatemia 40/12/2723   Chronic systolic CHF (congestive heart failure) ([place]) 08/07/2021   Infection due to ESBL-producing Escherichia coli    Aortic atherosclerosis ([place]) 08/02/2021   Adult failure to thrive syndrome 08/01/2021   Dysphagia 08/01/2021   Troponin I above reference range 07/18/2021   Chronic combined systolic and diastolic CHF (congestive heart failure) ([place]) 07/18/2021   AKI (acute kidney injury) ([place]) 05/15/2021   Sepsis ([place]) 05/14/2021   Multiple thyroid nodules 05/14/2021   AMS (altered mental status) 05/01/2021   Pressure injury of skin 05/01/2021   Malnutrition of moderate degree 05/01/2021   Hypokalemia    Urinary tract infection without hematuria    Lactic acidosis    Hypernatremia    Reactive depression (situational) 04/05/2021   Chest pain 04/06/2020   CAD (coronary artery disease)    Gastroesophageal reflux disease without esophagitis    Renal calculus, left 01/10/2019   Chest pain in adult 03/02/2017   Hypoxia    Acute on chronic diastolic CHF (congestive heart failure) ([place])    Hx of CABG    S/P angioplasty with stent; 10/04/15  to distal RCA lesion. with Promus premier. 10/08/2015   CAD (coronary artery disease) of artery bypass graft 10/08/2015   History of ST elevation myocardial infarction (STEMI) 10/04/2015   Superficial incisional surgical site infection, medial left lower extremity 07/10/2015   Hyperlipidemia with target LDL less than 70 06/13/2015   Chest pain with moderate risk of acute coronary syndrome    Tobacco abuse    Insulin-requiring or dependent type II diabetes mellitus ([place]) 06/04/2012   Lupus ([place])  06/03/2012   History of stroke 06/03/2012   Empty sella syndrome ([place]) 06/03/2012   Accelerated hypertension 06/03/2012   Rheumatoid arthritis ([place]) 06/03/2012   Hidradenitis axillaris 06/03/2012   Obesity (BMI 30.0-34.9)    PCP:  Leeroy Cha, MD Pharmacy:  No Pharmacies Listed    Social Determinants of Health (SDOH) Interventions    Readmission Risk Interventions     View : No data to display.

## 2021-08-09 NOTE — Progress Notes (Signed)
Daily Progress Note   Patient Name: [name] [name]       Date: 08/09/2021 DOB: [birth year]  Age: 61 y.o. MRN#: [id number] Attending Physician: Eugenie Filler, MD Primary Care Physician: Leeroy Cha, MD Admit Date: 08/05/2021  Reason for Consultation/Follow-up: Establishing goals of care  Subjective: Denies pain, discomfort - no family at bedside - remains a bit confused  Length of Stay: 4  Current Medications: Scheduled Meds:   Chlorhexidine Gluconate Cloth  6 each Topical Q0600   feeding supplement (OSMOLITE 1.5 CAL)  1,000 mL Per Tube Q24H   feeding supplement (PROSource TF)  45 mL Per Tube Daily   folic acid  1 mg Per Tube Daily   free water  175 mL Per Tube Q4H   heparin  5,000 Units Subcutaneous Q8H   metoprolol tartrate  2.5 mg Intravenous Q8H   mupirocin ointment  1 application. Nasal BID   pantoprazole (PROTONIX) IV  40 mg Intravenous Q24H    Continuous Infusions:  sodium chloride 100 mL/hr at 08/09/21 0914   meropenem (MERREM) IV 1 g (08/09/21 0559)   potassium PHOSPHATE IVPB (in mmol)      PRN Meds: acetaminophen, bisacodyl, HYDROmorphone (DILAUDID) injection, ondansetron **OR** ondansetron (ZOFRAN) IV  Physical Exam Constitutional:      General: She is not in acute distress.    Appearance: She is ill-appearing.     Comments: Slightly lethargic  Pulmonary:     Effort: Pulmonary effort is normal.  Skin:    General: Skin is warm and dry.  Neurological:     Comments: Orientation waxes/wanes, periods of confusion            Vital Signs: BP 132/84 (BP Location: Left Arm)   Pulse 75   Temp 99 F (37.2 C) (Oral)   Resp 20   SpO2 100%  SpO2: SpO2: 100 % O2 Device: O2 Device: Nasal Cannula O2 Flow Rate: O2 Flow Rate (L/min): 3 L/min  Intake/output summary:   Intake/Output Summary (Last 24 hours) at 08/09/2021 1218 Last data filed at 08/09/2021 0915 Gross per 24 hour  Intake 2505.12 ml  Output 200 ml  Net 2305.12 ml   LBM: Last BM Date : 08/09/21 Baseline Weight:   Most recent weight:         Palliative Assessment/Data: PPS 30%    Flowsheet Rows    Flowsheet Row Most Recent Value  Intake Tab   Referral Department Hospitalist  Unit at Time of Referral Cardiac/Telemetry Unit  Palliative Care Primary Diagnosis Sepsis/Infectious Disease  Date Notified 08/07/21  Palliative Care Type New Palliative care  Reason for referral Clarify Goals of Care  Date of Admission 08/05/21  Date first seen by Palliative Care 08/09/21  # of days Palliative referral response time 2 Day(s)  # of days IP prior to Palliative referral 2  Clinical Assessment   Psychosocial & Spiritual Assessment   Palliative Care Outcomes        Patient Active Problem List   Diagnosis Date Noted   Folate deficiency 08/09/2021   Hypophosphatemia 94/50/3888   Chronic systolic CHF (congestive heart failure) ([name] [name]) 08/07/2021   Infection due to ESBL-producing Escherichia coli    Aortic atherosclerosis ([name] [name]) 08/02/2021  Adult failure to thrive syndrome 08/01/2021   Dysphagia 08/01/2021   Troponin I above reference range 07/18/2021   Chronic combined systolic and diastolic CHF (congestive heart failure) ([name] [name]) 07/18/2021   AKI (acute kidney injury) ([name] [name]) 05/15/2021   Sepsis ([name] [name]) 05/14/2021   Multiple thyroid nodules 05/14/2021   AMS (altered mental status) 05/01/2021   Pressure injury of skin 05/01/2021   Malnutrition of moderate degree 05/01/2021   Hypokalemia    Urinary tract infection without hematuria    Lactic acidosis    Hypernatremia    Reactive depression (situational) 04/05/2021   Chest pain 04/06/2020   CAD (coronary artery disease)    Gastroesophageal reflux disease without esophagitis    Renal calculus, left 01/10/2019   Chest pain in adult  03/02/2017   Hypoxia    Acute on chronic diastolic CHF (congestive heart failure) ([place])    Hx of CABG    S/P angioplasty with stent; 10/04/15 to distal RCA lesion. with Promus premier. 10/08/2015   CAD (coronary artery disease) of artery bypass graft 10/08/2015   History of ST elevation myocardial infarction (STEMI) 10/04/2015   Superficial incisional surgical site infection, medial left lower extremity 07/10/2015   Hyperlipidemia with target LDL less than 70 06/13/2015   Chest pain with moderate risk of acute coronary syndrome    Tobacco abuse    Insulin-requiring or dependent type II diabetes mellitus ([name] [name]) 06/04/2012   Lupus ([name] [name]) 06/03/2012   History of stroke 06/03/2012   Empty sella syndrome ([name] [name]) 06/03/2012   Accelerated hypertension 06/03/2012   Rheumatoid arthritis ([name] [name]) 06/03/2012   Hidradenitis axillaris 06/03/2012   Obesity (BMI 30.0-34.9)     Palliative Care Assessment & Plan   HPI: 61 y.o. female  with past medical history of failure to thrive, dysphagia secondary to moderate esophageal dysmotility (barium swallow March 2023), psoriasis arthritis, CAD status post CABG, DM-2, and chronic systolic CHF LVEF 08% admitted on 08/05/2021 with AMS and fever.  Patient recently hospitalized with pneumonia and was discharged to a nursing home.  Patient has lost about 13 kg in the last 5 months per chart review.  Family reports several choking episodes at rehab.  This admission, patient diagnosed with severe sepsis secondary to ESBL E. coli bacteremia and aspiration pneumonia.  Patient with albumin less than 1.5 and ongoing severe protein calorie malnutrition.  PMT consulted to discuss goals of care.  Assessment: Follow-up today with patient.  No family at bedside.  Received report from nurse.  She seems about the same as yesterday.  Attempted conversation with her however her mental status precluded full goals of care conversations.  She is able to tell me she would like me to call [name] [name]  to further discuss care.  I called son [name] [name] and I introduced Palliative Medicine as specialized medical care for people living with serious illness. It focuses on providing relief from the symptoms and stress of a serious illness. The goal is to improve quality of life for both the patient and the family.  Patient's son shares that patient has been declining since December 2022.  He tells me prior to that she lived independently but since then has had multiple hospitalizations and ED visits.  He tells me a few months ago patient developed difficulty swallowing and had a fall.  He tells me of ongoing episodes of confusion at home.  He tells me of a hospitalization in March which patient was discharged to rehab then able to go home but had to be rehospitalized 2 weeks  later.  She was DC'd to rehab again following that hospitalization.  He tells me of ongoing functional decline and difficulty swallowing.  We discussed her weight loss and malnutrition.  Of note, yesterday patient told me her son [name] [name] lived with her but [name] [name] shares this is not accurate and that she was either living alone or at [employer].    We discussed patient's current illness and what it means in the larger context of patient's on-going co-morbidities.  We reviewed patient's modified barium swallow in March 2023 that diagnosed her with esophageal dysmotility.  We discussed that she continues to have swallowing issues.  We discussed her significant infection and sepsis.  We discussed repeated aspiration pneumonia and how her dysphagia leads to pneumonia.  We discussed that patient is currently dependent on a temporary feeding tube for nutrition and this will give [place] time to see how she does as we continue to treat her sepsis and pneumonia.  We discussed potential need for long-term alternate source of nutrition.  Briefly discussed PEG tube.  We also discussed that if patient continues to have significant dysphagia the PEG tube would  not eliminate risk of aspiration as she could continue to aspirate on her own secretions.  I attempted to elicit values and goals of care important to the patient.  Son tells me patient would not like the idea of a PEG tube and he will have to try to talk to her about this and spend time thinking about this.  We also did briefly discussed her CODE STATUS and he confirms that patient would want resuscitation attempts.  Discussed with son the importance of continued conversation with family and the medical providers regarding overall plan of care and treatment options, ensuring decisions are within the context of the patients values and GOCs.    [name] [name] does ask that he be called first for medical decisions making as he has been most involved in patient's medical decisions.   Questions and concerns were addressed. The family was encouraged to call with questions or concerns.  Recommendations/Plan: Continue current measures  ongoing discussions with patient as able and son [name] [name] Briefly discussed PEG tube - discussed this would not eliminate aspiration risk - discussed this may be a decision that comes up in the future Son confirms full code Will continue to follow  Code Status: Full code  Care plan was discussed with RN, patient's son [name] [name]  Thank you for allowing the Palliative Medicine Team to assist in the care of this patient.  *Please note that this is a verbal dictation therefore any spelling or grammatical errors are due to the "[place] [employer]" system interpretation.  Juel Burrow, DNP, [facility] Palliative Medicine Team Team Phone # [phone]  Pager [phone]

## 2021-08-09 NOTE — Progress Notes (Signed)
Pharmacy Antibiotic Note  [name] [name] is a 61 y.o. female admitted on 08/05/2021 with pneumonia.  Pt recently admitted for pneumonia and treated with levofloxacin 5/7-5/11 (x5d course). Pharmacy has been consulted for meropenem dosing. Patient has since been diagnosed with ESBL E.coli bacteremia.   Plan: Continue meropenem 1g Q8h until 5/31 Monitor daily WBC, temp, SCr, and clinical s/sx of infection     Temp (24hrs), Avg:98.8 F (37.1 C), Min:98 F (36.7 C), Max:99.4 F (37.4 C)  Recent Labs  Lab 08/05/21 1059 08/05/21 1107 08/05/21 1525 08/06/21 0011 08/07/21 0409 08/08/21 0416 08/09/21 0638  WBC  --  12.2*  --  15.8* 6.6 7.4 5.6  CREATININE  --  1.54*  --  1.17* 0.91 0.71  --   LATICACIDVEN 2.5*  --  3.1*  --   --   --   --      Estimated Creatinine Clearance: 68.3 mL/min (by C-G formula based on SCr of 0.71 mg/dL).    Allergies  Allergen Reactions   Apremilast Other (See Comments)    unknown   Latex Itching, Swelling and Rash    Antimicrobials this admission: Cefepime 5/22  Vancomycin 5/22 >> 5/24 Meropenem 5/23 >> (5/31)  Dose adjustments this admission: N/A  Microbiology results: 5/22 BCx: 3 of 4 CTX-M E. coli 5/22 MRSA PCR: positive   [name] [name] A. Levada Dy, PharmD, BCPS, FNKF Clinical Pharmacist [place] Please utilize Amion for appropriate phone number to reach the unit pharmacist ([place])

## 2021-08-10 DIAGNOSIS — N179 Acute kidney failure, unspecified: Secondary | ICD-10-CM | POA: Diagnosis not present

## 2021-08-10 DIAGNOSIS — A419 Sepsis, unspecified organism: Secondary | ICD-10-CM | POA: Diagnosis not present

## 2021-08-10 DIAGNOSIS — R4182 Altered mental status, unspecified: Secondary | ICD-10-CM | POA: Diagnosis not present

## 2021-08-10 DIAGNOSIS — R627 Adult failure to thrive: Secondary | ICD-10-CM | POA: Diagnosis not present

## 2021-08-10 LAB — OCCULT BLOOD X 1 CARD TO LAB, STOOL: Fecal Occult Bld: POSITIVE — AB

## 2021-08-10 LAB — GLUCOSE, CAPILLARY
Glucose-Capillary: 156 mg/dL — ABNORMAL HIGH (ref 70–99)
Glucose-Capillary: 104 mg/dL — ABNORMAL HIGH (ref 70–99)
Glucose-Capillary: 118 mg/dL — ABNORMAL HIGH (ref 70–99)

## 2021-08-10 LAB — RENAL FUNCTION PANEL
Albumin: <1.5 g/dL — ABNORMAL LOW (ref 3.5–5.0)
Anion gap: 5 (ref 5–15)
BUN: 7 mg/dL — ABNORMAL LOW (ref 8–23)
CO2: 22 mmol/L (ref 22–32)
Calcium: 6.9 mg/dL — ABNORMAL LOW (ref 8.9–10.3)
Chloride: 107 mmol/L (ref 98–111)
Creatinine, Ser: 0.44 mg/dL (ref 0.44–1.00)
GFR, Estimated: >60 mL/min (ref >60)
Glucose, Bld: 168 mg/dL — ABNORMAL HIGH (ref 70–99)
Phosphorus: 1.2 mg/dL — ABNORMAL LOW (ref 2.5–4.6)
Potassium: 4.1 mmol/L (ref 3.5–5.1)
Sodium: 134 mmol/L — ABNORMAL LOW (ref 135–145)

## 2021-08-10 LAB — CBC
HCT: 30.9 % — ABNORMAL LOW (ref 36.0–46.0)
Hemoglobin: 10 g/dL — ABNORMAL LOW (ref 12.0–15.0)
MCH: 30.1 pg (ref 26.0–34.0)
MCHC: 32.4 g/dL (ref 30.0–36.0)
MCV: 93.1 fL (ref 80.0–100.0)
Platelets: 120 10*3/uL — ABNORMAL LOW (ref 150–400)
RBC: 3.32 MIL/uL — ABNORMAL LOW (ref 3.87–5.11)
RDW: 16.3 % — ABNORMAL HIGH (ref 11.5–15.5)
WBC: 6 10*3/uL (ref 4.0–10.5)
nRBC: 0 % (ref 0.0–0.2)

## 2021-08-10 LAB — MAGNESIUM: Magnesium: 2 mg/dL (ref 1.7–2.4)

## 2021-08-10 MED ORDER — POLYETHYLENE GLYCOL 3350 17 G PO PACK
17.0000 g | PACK | Freq: Every day | ORAL | Status: DC
  Administered 2021-08-10: 17 g
  Filled 2021-08-10: qty 1

## 2021-08-10 MED ORDER — SODIUM CHLORIDE 0.9 % IV SOLN: INTRAVENOUS | Status: DC

## 2021-08-10 MED ORDER — SODIUM PHOSPHATES 45 MMOLE/15ML IV SOLN
30.0000 mmol | Freq: Once | INTRAVENOUS | Status: AC
  Administered 2021-08-10: 30 mmol via INTRAVENOUS
  Filled 2021-08-10: qty 10

## 2021-08-10 MED ORDER — POTASSIUM PHOSPHATES 15 MMOLE/5ML IV SOLN
30.0000 mmol | Freq: Once | INTRAVENOUS | Status: DC
  Filled 2021-08-10: qty 10

## 2021-08-10 MED ORDER — ESCITALOPRAM OXALATE 10 MG PO TABS
10.0000 mg | ORAL_TABLET | Freq: Every day | ORAL | Status: DC
  Administered 2021-08-10 – 2021-08-25 (×16): 10 mg
  Filled 2021-08-10 (×16): qty 1

## 2021-08-10 MED ORDER — GABAPENTIN 250 MG/5ML PO SOLN
100.0000 mg | Freq: Two times a day (BID) | ORAL | Status: DC
  Administered 2021-08-10 – 2021-08-26 (×33): 100 mg
  Filled 2021-08-10 (×37): qty 2

## 2021-08-10 MED ORDER — ASPIRIN 81 MG PO CHEW
81.0000 mg | CHEWABLE_TABLET | Freq: Every day | ORAL | Status: DC
  Administered 2021-08-10 – 2021-08-14 (×5): 81 mg
  Filled 2021-08-10 (×5): qty 1

## 2021-08-10 MED ORDER — ATORVASTATIN CALCIUM 80 MG PO TABS
80.0000 mg | ORAL_TABLET | Freq: Every day | ORAL | Status: DC
  Administered 2021-08-10 – 2021-08-25 (×16): 80 mg
  Filled 2021-08-10 (×16): qty 1

## 2021-08-10 MED ORDER — PANTOPRAZOLE 2 MG/ML SUSPENSION
40.0000 mg | Freq: Every day | ORAL | Status: DC
  Administered 2021-08-10 – 2021-08-26 (×17): 40 mg
  Filled 2021-08-10 (×19): qty 20

## 2021-08-10 MED ORDER — GABAPENTIN 100 MG PO CAPS: 100.0000 mg | ORAL_CAPSULE | Freq: Two times a day (BID) | ORAL | Status: DC

## 2021-08-10 MED ORDER — POTASSIUM & SODIUM PHOSPHATES 280-160-250 MG PO PACK
2.0000 | PACK | Freq: Three times a day (TID) | ORAL | Status: AC
  Administered 2021-08-10 – 2021-08-12 (×9): 2
  Filled 2021-08-10 (×10): qty 2

## 2021-08-10 MED ORDER — ASPIRIN 81 MG PO TBEC: 81.0000 mg | DELAYED_RELEASE_TABLET | Freq: Every day | ORAL | Status: DC

## 2021-08-10 MED ORDER — METOPROLOL TARTRATE 25 MG/10 ML ORAL SUSPENSION
25.0000 mg | Freq: Two times a day (BID) | ORAL | Status: DC
  Administered 2021-08-10 – 2021-08-26 (×33): 25 mg
  Filled 2021-08-10 (×34): qty 10

## 2021-08-10 NOTE — Progress Notes (Signed)
PROGRESS NOTE    [name] [name]  STM:[id number] DOB: April 28, 1960 DOA: 08/05/2021 PCP: Leeroy Cha, MD    Chief Complaint  Patient presents with   Altered Mental Status    Brief Narrative:  HPI per Dr. Wynetta Fines on 08/05/21 [name] [name] is a 61 y.o. female with medical history significant of failure to thrive, dysphagia secondary to moderate esophageal dysmotility (barium swallow March 2023), psoriasis arthritis, CAD status post CABG, DM-2, chronic systolic CHF LVEF 89%, was sent to hospital for evaluation of altered mentations hypoxia and fever.   Patient was recently hospitalized and discharged to nursing home 11 days ago after treatment of supposed empyema and pneumonia.  Patient has established history of failure to thrive and has been losing weight, and according to the record she lost about 13 kg in last 37-month.    Patient unable to provide any history as she is confused and agitated.  All history provided by patient and [name]([phone], and patient was discharged on soft diet, last week, her diet was downgraded to pured.  Despite, family observed several times patient had choking episode after eating for food.  This morning, family was contacted about patient had after mentations and hypoxia   ED Course: Patient was found hypoxic initially briefly on 15 L of nonrebreather and titrated down to 5 L, tachycardia hypotensive but responded to IV fluid.  Febrile 101.4   Angiogram negative for PE but patchy infiltrates on bilateral lower lung fields suspicious for pneumonia.  Small right-sided pleural effusion.   Patient was started on vancomycin and cefepime.  Sodium 149, K5.2, creatinine 1.5, WBC 12.2.   **Interim History Patient feels short of breath but oxygen requirement has been weaned from 5 L to 3 L.  Still having difficulty swallowing and managing secretions so she is made n.p.o.  Discussed with daughter about a small bore feeding tube and she may be agreeable  for tomorrow.  She is found to have an ESBL E. coli bacteremia and is now on IV meropenem     Assessment & Plan:   Principal Problem:   Sepsis ([facility] Active Problems:   Rheumatoid arthritis ([facility]   Gastroesophageal reflux disease without esophagitis   AMS (altered mental status)   Hypokalemia   Hypernatremia   Pressure injury of skin   Malnutrition of moderate degree   AKI (acute kidney injury) ([facility]   Adult failure to thrive syndrome   Dysphagia   Chronic systolic CHF (congestive heart failure) (HCC)   Infection due to ESBL-producing Escherichia coli   Hypophosphatemia   Folate deficiency  #1 severe sepsis secondary to ESBL E. coli bacteremia, aspiration pneumonia, possible UTI/acute respiratory failure with hypoxia/lactic acidosis. -Patient noted to have met criteria for sepsis with tachycardia, new onset hypoxia, fever, suspected source noted to be pneumonia, bacteremia UTI.  Patient noted with signs of endorgan damage of AKI and acute encephalopathy. -Improving clinically. -Patient with history of aspiration and radiologic pattern of pneumonia with concern for aspiration pneumonia. -MRSA PCR positive. -Patient was on IV vancomycin, IV cefepime and IV cefepime changed to IV Merrem due to ESBL E. coli. -Patient seen by speech therapy and made n.p.o. due to increased risk of aspiration and difficulty with clearing secretions. -Leukocytosis trending down. -Procalcitonin noted to be elevated but not repeated this morning. -Blood cultures done positive for ESBL E. coli. -Patient with poor nutritional status, currently n.p.o., likely needs feeding tube in the future patient cannot meet calorie intake treatment with recurrent episodes of aspiration  despite being placed on a dysphagia 1 diet. -Feeding tube ordered. -Patient seen in consultation by ID and IV vancomycin has been discontinued and recommending continuation of Merrem to treat for total of 7 days. -Supportive care..  2.   Failure to thrive/dysphagia/severe protein calorie malnutrition -Patient noted to be cachectic, with some temporal wasting, failure to thrive. -Patient with esophageal dysmotility. -Patient noted to have had a swallow evaluation in March noting moderate dysmotility concern for inadequate nutrition as albumin noted to < 1.5. -Cortrack ordered and placed 08/07/2021. -Consulted with dietitian to start tube feeds. -Status post IV albumin every 6 hours x24 hours.  -Albumin levels < 1.5. -Status post course of fluconazole for Candida esophagitis during last hospitalization. -Palliative care consulted and following. -Will need consult with IR for PEG tube placement after palliative care consultation completed if patient desires.  3.  Acute kidney injury -Likely secondary to prerenal azotemia in the setting of severe dehydration, volume contraction. -Patient noted to have been on half-normal saline which was changed to D5W. -Renal function improved with hydration. -Avoid nephrotoxins. -Change IV fluids to normal saline.  -Follow.  4.  Hypernatremia -Secondary to dehydration, failure to thrive, volume depletion.  -Improved initially on D5W, subsequently on D5 half-normal saline with resolution of hypernatremia with sodium at 133 this morning.  -Change IV fluids to normal saline -Follow.  5.  UTI with hematuria versus bacteriuria -Is noted to be turbid, moderate leukocytes, nitrite negative, 21-50 WBCs, WBC clumps noted, many bacteria. -Urine cultures sent 08/05/2021 with results still pending. -On IV Merrem.  6.  Hyperkalemia>>>> hypokalemia/hypophosphatemia -Likely secondary to acute kidney injury. -Hyperkalemia improved currently resolved patient noted to be hypokalemic and potassium repleted currently at 4.1.   -Phosphorus level at 1.2.   -Sodium phosphate 30 mmol IV x1, also placed on phosphorus supplementation via tube.   -Repeat labs in the AM.    7.  Acute metabolic  encephalopathy -Likely secondary to sepsis/acute infection. -Clinical improvement.   -Continue IV antibiotics, tube feeds.   -Follow.  8.  Hypertension -Patient currently n.p.o. with esophageal dysmotility. -Blood pressure currently controlled on IV Lopressor 2.5 mg every 8 hours. -Change IV Lopressor to home regimen Lopressor per tube.  9.  Chronic systolic CHF -Patient dehydrated, volume contracted and also with hypoxia from pneumonia. -Continue gentle hydration for another 24 hours and discontinue fluids. -No signs of acute decompensation  -Follow.  10.  Psoriasis arthritis -No clear etiology of esophageal dysmotility, unsure whether related to psoriatic arthritis which is likely poorly controlled. -We will need outpatient follow-up with rheumatology.  11.  Thrombocytopenia -In the setting of sepsis. -Platelet count fluctuating.   12.  GERD -Change IV PPI to per tube.   13.  Anemia./Folate deficiency -Patient with no overt bleeding. -Likely dilutional component. -Anemia panel with iron level of 19, folate of 4.0, ferritin at 1850, TIBC not calculated.  Vitamin B12 levels of 421.  -H&H stable at 10.  -Continue folic acid 1 mg daily.  -Transfusion threshold hemoglobin < 7.  14.  Pseudohypocalcemia -Corrected calcium at 9.30.   14.  Pressure injury, POA Pressure Injury 08/05/21 Buttocks Right Stage 2 -  Partial thickness loss of dermis presenting as a shallow open injury with a red, pink wound bed without slough. red, no drainage (Active)  08/05/21 1835  Location: Buttocks  Location Orientation: Right  Staging: Stage 2 -  Partial thickness loss of dermis presenting as a shallow open injury with a red, pink wound bed without slough.  Wound Description (Comments): red, no drainage  Present on Admission: Yes     Pressure Injury 08/05/21 Hip Right Stage 2 -  Partial thickness loss of dermis presenting as a shallow open injury with a red, pink wound bed without slough.  (Active)  08/05/21 1837  Location: Hip  Location Orientation: Right  Staging: Stage 2 -  Partial thickness loss of dermis presenting as a shallow open injury with a red, pink wound bed without slough.  Wound Description (Comments):   Present on Admission: Yes         DVT prophylaxis: Heparin Code Status: Full Family Communication: Updated patient.  No family at bedside. Disposition: Likely back to SNF.  Status is: Inpatient Remains inpatient appropriate because: Severity of illness.   Consultants:  ID: Dr. Juleen China 08/07/2021 [place] Palliative care pending  Procedures:  CT angiogram chest 08/05/2021 Chest x-ray 08/05/2021, 08/06/2021 Abdominal films 08/07/2021 Cortrack placement 08/07/2021   Antimicrobials:  IV cefepime 08/05/2021>>>> 08/06/2021 IV Merrem 08/06/2021>>>> 08/13/2021 IV vancomycin 08/05/2021>>>> 08/07/2021   Subjective: Patient laying in bed.  Complains of abdominal discomfort today.  No chest pain.  No shortness of breath.  Cortrack in place.  Objective: Vitals:   08/09/21 2007 08/10/21 0406 08/10/21 0705 08/10/21 0905  BP: 132/81 120/73 120/67 121/73  Pulse: 96 87    Resp:   20 (!) 22  Temp: 98.6 F (37 C) 97.9 F (36.6 C) 97.8 F (36.6 C)   TempSrc: Oral Oral Oral   SpO2: 98%  98%   Weight:  74.6 kg      Intake/Output Summary (Last 24 hours) at 08/10/2021 1211 Last data filed at 08/10/2021 0455 Gross per 24 hour  Intake 3196.18 ml  Output --  Net 3196.18 ml    Filed Weights   08/10/21 0406  Weight: 74.6 kg    Examination:  General exam: Frail.  Cachectic.  Some temporal wasting. Cortrack in place. Respiratory system: Some coarse breath sounds anterior lung fields.  No wheezing.  Fair air movement.  Cardiovascular system: Regular rate rhythm no murmurs rubs or gallops.  No JVD.  No lower extremity edema. Gastrointestinal system: Abdomen is soft, nontender, nondistended, positive bowel sounds.  No rebound.  No guarding.  Central nervous system:  Alert and oriented x3. No focal neurological deficits. Extremities: Symmetric 5 x 5 power. Skin: No rashes, lesions or ulcers Psychiatry: Judgement and insight appear normal. Mood & affect appropriate.     Data Reviewed: I have personally reviewed following labs and imaging studies  CBC: Recent Labs  Lab 08/05/21 1107 08/05/21 1153 08/06/21 0011 08/07/21 0409 08/08/21 0416 08/09/21 0638 08/10/21 0236  WBC 12.2*  --  15.8* 6.6 7.4 5.6 6.0  NEUTROABS 10.9*  --   --  5.6  --   --   --   HGB 13.7   < > 12.1 13.2 10.1* 11.6* 10.0*  HCT 44.4   < > 38.2 43.3 31.9* 36.4 30.9*  MCV 95.5  --  96.5 97.3 96.1 94.5 93.1  PLT 179  --  140* 163 148* 124* 120*   < > = values in this interval not displayed.     Basic Metabolic Panel: Recent Labs  Lab 08/06/21 0011 08/07/21 0409 08/07/21 0409 08/07/21 1826 08/08/21 0416 08/08/21 1515 08/09/21 0638 08/10/21 0236  NA 149* 149*  --   --  141  --  139 134*  K 3.7 3.8  --   --  3.0*  --  4.0 4.1  CL 119* 117*  --   --  113*  --  112* 107  CO2 25 26  --   --  25  --  22 22  GLUCOSE 132* 63*  --   --  150*  --  233* 168*  BUN 29* 21  --   --  14  --  8 7*  CREATININE 1.17* 0.91  --   --  0.71  --  0.51 0.44  CALCIUM 8.3* 8.4*  --   --  8.1*  --  7.5* 6.9*  MG  --  2.2   < > 1.9 1.9 1.8 1.7 2.0  PHOS  --  2.5   < > 1.8* 1.6* 2.6 1.3*  1.3* 1.2*   < > = values in this interval not displayed.     GFR: Estimated Creatinine Clearance: 75.3 mL/min (by C-G formula based on SCr of 0.44 mg/dL).  Liver Function Tests: Recent Labs  Lab 08/05/21 1107 08/07/21 0409 08/08/21 0416 08/09/21 0638 08/10/21 0236  AST 75* 50*  --   --   --   ALT 21 17  --   --   --   ALKPHOS 223* 169*  --   --   --   BILITOT 1.2 0.3  --   --   --   PROT 7.1 6.6  --   --   --   ALBUMIN 1.5* <1.5* 2.1* 1.7* <1.5*     CBG: Recent Labs  Lab 08/09/21 1200 08/09/21 1606 08/09/21 2045 08/09/21 2355 08/10/21 0359  GLUCAP 195* 175* 159* 135* 156*       Recent Results (from the past 240 hour(s))  Resp Panel by RT-PCR (Flu A&B, Covid) Nasopharyngeal Swab     Status: None   Collection Time: 08/05/21 10:46 AM   Specimen: Nasopharyngeal Swab; Nasopharyngeal(NP) swabs in vial transport medium  Result Value Ref Range Status   SARS Coronavirus 2 by RT PCR NEGATIVE NEGATIVE Final    Comment: (NOTE) SARS-CoV-2 target nucleic acids are NOT DETECTED.  The SARS-CoV-2 RNA is generally detectable in upper respiratory specimens during the acute phase of infection. The lowest concentration of SARS-CoV-2 viral copies this assay can detect is 138 copies/mL. A negative result does not preclude SARS-Cov-2 infection and should not be used as the sole basis for treatment or other patient management decisions. A negative result may occur with  improper specimen collection/handling, submission of specimen other than nasopharyngeal swab, presence of viral mutation(s) within the areas targeted by this assay, and inadequate number of viral copies(<138 copies/mL). A negative result must be combined with clinical observations, patient history, and epidemiological information. The expected result is Negative.  Fact Sheet for Patients:  EntrepreneurPulse.com.au  Fact Sheet for Healthcare Providers:  IncredibleEmployment.be  This test is no t yet approved or cleared by the [place] FDA and  has been authorized for detection and/or diagnosis of SARS-CoV-2 by FDA under an Emergency Use Authorization (EUA). This EUA will remain  in effect (meaning this test can be used) for the duration of the COVID-19 declaration under Section 564(b)(1) of the Act, 21 U.S.C.section 360bbb-3(b)(1), unless the authorization is terminated  or revoked sooner.       Influenza A by PCR NEGATIVE NEGATIVE Final   Influenza B by PCR NEGATIVE NEGATIVE Final    Comment: (NOTE) The Xpert Xpress SARS-CoV-2/FLU/RSV plus assay is intended  as an aid in the diagnosis of influenza from Nasopharyngeal swab specimens and should not be used as a sole basis for treatment. Nasal washings and aspirates are  unacceptable for Xpert Xpress SARS-CoV-2/FLU/RSV testing.  Fact Sheet for Patients: EntrepreneurPulse.com.au  Fact Sheet for Healthcare Providers: IncredibleEmployment.be  This test is not yet approved or cleared by the [place] FDA and has been authorized for detection and/or diagnosis of SARS-CoV-2 by FDA under an Emergency Use Authorization (EUA). This EUA will remain in effect (meaning this test can be used) for the duration of the COVID-19 declaration under Section 564(b)(1) of the Act, 21 U.S.C. section 360bbb-3(b)(1), unless the authorization is terminated or revoked.  Performed at [facility], [place] [address] [place], [place] [address]   Blood Culture (routine x 2)     Status: Abnormal   Collection Time: 08/05/21 10:46 AM   Specimen: BLOOD  Result Value Ref Range Status   Specimen Description BLOOD BLOOD RIGHT WRIST  Final   Special Requests   Final    BOTTLES DRAWN AEROBIC AND ANAEROBIC Blood Culture adequate volume   Culture  Setup Time   Final    GRAM NEGATIVE RODS IN BOTH AEROBIC AND ANAEROBIC BOTTLES CRITICAL VALUE NOTED.  VALUE IS CONSISTENT WITH PREVIOUSLY REPORTED AND CALLED VALUE.    Culture (A)  Final    ESCHERICHIA COLI SUSCEPTIBILITIES PERFORMED ON PREVIOUS CULTURE WITHIN THE LAST 5 DAYS. Performed at [facility], [place] [address], [place], [place] [address]    Report Status 08/08/2021 FINAL  Final  Blood Culture (routine x 2)     Status: Abnormal   Collection Time: 08/05/21 10:51 AM   Specimen: BLOOD  Result Value Ref Range Status   Specimen Description BLOOD BLOOD LEFT WRIST  Final   Special Requests   Final    BOTTLES DRAWN AEROBIC AND ANAEROBIC Blood Culture adequate volume   Culture  Setup Time   Final    GRAM NEGATIVE  RODS ANAEROBIC BOTTLE ONLY CRITICAL RESULT CALLED TO, READ BACK BY AND VERIFIED WITH: PHARMD ADRIENNE WELLBORN 08/06/21_0 :41 BY TW IN BOTH AEROBIC AND ANAEROBIC BOTTLES Performed at [facility], [place] [address] [place], [place] [address]    Culture (A)  Final    ESCHERICHIA COLI Confirmed Extended Spectrum Beta-Lactamase Producer (ESBL).  In bloodstream infections from ESBL organisms, carbapenems are preferred over piperacillin/tazobactam. They are shown to have a lower risk of mortality.    Report Status 08/08/2021 FINAL  Final   Organism ID, Bacteria ESCHERICHIA COLI  Final      Susceptibility   Escherichia coli - MIC*    AMPICILLIN >=32 RESISTANT Resistant     CEFAZOLIN >=64 RESISTANT Resistant     CEFEPIME >=32 RESISTANT Resistant     CEFTAZIDIME >=64 RESISTANT Resistant     CEFTRIAXONE >=64 RESISTANT Resistant     CIPROFLOXACIN >=4 RESISTANT Resistant     GENTAMICIN <=1 SENSITIVE Sensitive     IMIPENEM <=0.25 SENSITIVE Sensitive     TRIMETH/SULFA >=320 RESISTANT Resistant     AMPICILLIN/SULBACTAM >=32 RESISTANT Resistant     PIP/TAZO 16 SENSITIVE Sensitive     * ESCHERICHIA COLI  Blood Culture ID Panel (Reflexed)     Status: Abnormal   Collection Time: 08/05/21 10:51 AM  Result Value Ref Range Status   Enterococcus faecalis NOT DETECTED NOT DETECTED Final   Enterococcus Faecium NOT DETECTED NOT DETECTED Final   Listeria monocytogenes NOT DETECTED NOT DETECTED Final   Staphylococcus species NOT DETECTED NOT DETECTED Final   Staphylococcus aureus (BCID) NOT DETECTED NOT DETECTED Final   Staphylococcus epidermidis NOT DETECTED NOT DETECTED Final   Staphylococcus lugdunensis NOT DETECTED  NOT DETECTED Final   Streptococcus species NOT DETECTED NOT DETECTED Final   Streptococcus agalactiae NOT DETECTED NOT DETECTED Final   Streptococcus pneumoniae NOT DETECTED NOT DETECTED Final   Streptococcus pyogenes NOT DETECTED NOT DETECTED Final   A.calcoaceticus-baumannii NOT  DETECTED NOT DETECTED Final   Bacteroides fragilis NOT DETECTED NOT DETECTED Final   Enterobacterales DETECTED (A) NOT DETECTED Final    Comment: Enterobacterales represent a large order of gram negative bacteria, not a single organism. CRITICAL RESULT CALLED TO, READ BACK BY AND VERIFIED WITH: PHARMD ADRIENNE WELLBORN 08/06/21_0 :41 BY TW    Enterobacter cloacae complex NOT DETECTED NOT DETECTED Final   Escherichia coli DETECTED (A) NOT DETECTED Final    Comment: CRITICAL RESULT CALLED TO, READ BACK BY AND VERIFIED WITH: PHARMD ADRIENNE WELLBORN 08/06/21_1 :41 BY TW    Klebsiella aerogenes NOT DETECTED NOT DETECTED Final   Klebsiella oxytoca NOT DETECTED NOT DETECTED Final   Klebsiella pneumoniae NOT DETECTED NOT DETECTED Final   Proteus species NOT DETECTED NOT DETECTED Final   Salmonella species NOT DETECTED NOT DETECTED Final   Serratia marcescens NOT DETECTED NOT DETECTED Final   Haemophilus influenzae NOT DETECTED NOT DETECTED Final   Neisseria meningitidis NOT DETECTED NOT DETECTED Final   Pseudomonas aeruginosa NOT DETECTED NOT DETECTED Final   Stenotrophomonas maltophilia NOT DETECTED NOT DETECTED Final   Candida albicans NOT DETECTED NOT DETECTED Final   Candida auris NOT DETECTED NOT DETECTED Final   Candida glabrata NOT DETECTED NOT DETECTED Final   Candida krusei NOT DETECTED NOT DETECTED Final   Candida parapsilosis NOT DETECTED NOT DETECTED Final   Candida tropicalis NOT DETECTED NOT DETECTED Final   Cryptococcus neoformans/gattii NOT DETECTED NOT DETECTED Final   CTX-M ESBL DETECTED (A) NOT DETECTED Final    Comment: CRITICAL RESULT CALLED TO, READ BACK BY AND VERIFIED WITH: PHARMD ADRIENNE WELLBORN 08/06/21_2 :41 BY TW (NOTE) Extended spectrum beta-lactamase detected. Recommend a carbapenem as initial therapy.      Carbapenem resistance IMP NOT DETECTED NOT DETECTED Final   Carbapenem resistance KPC NOT DETECTED NOT DETECTED Final   Carbapenem resistance NDM NOT  DETECTED NOT DETECTED Final   Carbapenem resist OXA 48 LIKE NOT DETECTED NOT DETECTED Final   Carbapenem resistance VIM NOT DETECTED NOT DETECTED Final    Comment: Performed at [facility], [place] [address] [place], [place] [address]  MRSA Next Gen by PCR, Nasal     Status: Abnormal   Collection Time: 08/06/21  3:00 AM   Specimen: Nasal Mucosa; Nasal Swab  Result Value Ref Range Status   MRSA by PCR Next Gen DETECTED (A) NOT DETECTED Final    Comment: RESULT CALLED TO, READ BACK BY AND VERIFIED WITH: RN Heloise Ochoa 08/06/21_3 :41 BY TW (NOTE) The GeneXpert MRSA Assay (FDA approved for NASAL specimens only), is one component of a comprehensive MRSA colonization surveillance program. It is not intended to diagnose MRSA infection nor to guide or monitor treatment for MRSA infections. Test performance is not FDA approved in patients less than 74 years old. Performed at [facility], [place] [address], [place], [place] [address]           Radiology Studies: No results found.      Scheduled Meds:  aspirin  81 mg Per Tube Daily   atorvastatin  80 mg Per Tube Daily   escitalopram  10 mg Per Tube QHS   feeding supplement (OSMOLITE 1.5 CAL)  1,000 mL Per Tube Q24H   feeding supplement (PROSource  TF)  45 mL Per Tube Daily   folic acid  1 mg Per Tube Daily   free water  175 mL Per Tube Q4H   gabapentin  100 mg Per Tube Q12H   heparin  5,000 Units Subcutaneous Q8H   metoprolol tartrate  25 mg Per Tube BID   mupirocin ointment  1 application. Nasal BID   pantoprazole sodium  40 mg Per Tube Daily   polyethylene glycol  17 g Per Tube Daily   potassium & sodium phosphates  2 packet Per Tube TID   Continuous Infusions:  sodium chloride 75 mL/hr at 08/10/21 0921   meropenem (MERREM) IV 1 g (08/10/21 0453)   sodium phosphate  Dextrose 5% IVPB 30 mmol (08/10/21 0947)     LOS: 5 days    Time spent: 40 minutes    Irine Seal, MD [facility]   To contact the  attending provider between 7A-7P or the covering provider during after hours 7P-7A, please log into the web site [email or web address] and access using universal [place] password for that web site. If you do not have the password, please call the hospital operator.  08/10/2021, 12:11 PM

## 2021-08-10 NOTE — Evaluation (Signed)
Occupational Therapy Evaluation Patient Details Name: [name] [name] MRN: [id number] DOB: 10-18-1960 Today's Date: 08/10/2021   History of Present Illness [name] [name] is a 61 y.o. female with medical history significant of failure to thrive, dysphagia secondary to moderate esophageal dysmotility (barium swallow March 2023), psoriasis arthritis, CAD status post CABG, DM-2, chronic systolic CHF LVEF 38%, was sent to hospital for evaluation of altered mentations hypoxia and fever.   Clinical Impression    Pt presents with decline in function and safety with ADLs and ADL mobility with impaired strength, balance, endurance and cognition. Pt is also limited by generalized pain whish she reports as "all over". PTA pt live dat home with her son and reports that she was unable to get OOB and required assist with selfcare. Pt currently requires total A to sit EOB with max A for balance/support x 4 minutes. Pt requires max A - total ADLs/selfcare and would benefit from acute OT services to address impairments to maximize level of function and safety      Recommendations for follow up therapy are one component of a multi-disciplinary discharge planning process, led by the attending physician.  Recommendations may be updated based on patient status, additional functional criteria and insurance authorization.   Follow Up Recommendations  Skilled nursing-short term rehab (<3 hours/day)    Assistance Recommended at Discharge Frequent or constant Supervision/Assistance  Patient can return home with the following A lot of help with walking and/or transfers;A lot of help with bathing/dressing/bathroom;Direct supervision/assist for medications management;Direct supervision/assist for financial management    Functional Status Assessment  Patient has had a recent decline in their functional status and demonstrates the ability to make significant improvements in function in a reasonable and predictable amount of  time.  Equipment Recommendations  Wheelchair (measurements OT);Wheelchair cushion (measurements OT);Hospital bed    Recommendations for Other Services       Precautions / Restrictions Precautions Precautions: Fall Restrictions Weight Bearing Restrictions: No      Mobility Bed Mobility Overal bed mobility: Needs Assistance Bed Mobility: Rolling Rolling: Total assist   Supine to sit: Total assist Sit to supine: Total assist   General bed mobility comments: Total assist needed for bed moblity due to pain and weakness    Transfers                   General transfer comment: unable/unsafe at this time      Balance Overall balance assessment: Needs assistance Sitting-balance support: Bilateral upper extremity supported, Feet supported Sitting balance-Leahy Scale: Poor   Postural control: Left lateral lean                                 ADL either performed or assessed with clinical judgement   ADL Overall ADL's : Needs assistance/impaired Eating/Feeding: Minimal assistance;Bed level Eating/Feeding Details (indicate cue type and reason): siumlated seated with HOB raised Grooming: Wash/dry hands;Wash/dry face;Moderate assistance;Sitting Grooming Details (indicate cue type and reason): from EOB Upper Body Bathing: Maximal assistance   Lower Body Bathing: Total assistance   Upper Body Dressing : Total assistance   Lower Body Dressing: Total assistance     Toilet Transfer Details (indicate cue type and reason): unable/usafe to attempt at this time Toileting- Clothing Manipulation and Hygiene: Total assistance;Bed level       Functional mobility during ADLs: Total assistance       Vision Baseline Vision/History: 0 No visual deficits Ability  to See in Adequate Light: 0 Adequate Patient Visual Report: No change from baseline       Perception     Praxis      Pertinent Vitals/Pain Pain Assessment Pain Assessment: Faces Faces Pain  Scale: Hurts even more Pain Location: generalized Pain Descriptors / Indicators: Discomfort, Grimacing, Guarding, Moaning Pain Intervention(s): Monitored during session, Repositioned, Limited activity within patient's tolerance     Hand Dominance Right   Extremity/Trunk Assessment Upper Extremity Assessment Upper Extremity Assessment: Generalized weakness;RUE deficits/detail;LUE deficits/detail RUE Deficits / Details: R elbow with impaired extension, unable to FF >30 degrees in shoulder LUE Deficits / Details: extension impaired in L elbow but able to FF WNL in shoulder   Lower Extremity Assessment Lower Extremity Assessment: Defer to PT evaluation       Communication Communication Communication: No difficulties   Cognition Arousal/Alertness: Awake/alert Behavior During Therapy: Flat affect Overall Cognitive Status: No family/caregiver present to determine baseline cognitive functioning Area of Impairment: Following commands, Awareness, Problem solving                       Following Commands: Follows one step commands inconsistently, Follows one step commands with increased time Safety/Judgement: Decreased awareness of deficits   Problem Solving: Slow processing, Decreased initiation, Difficulty sequencing, Requires verbal cues, Requires tactile cues General Comments: Patient moaning and painful with any movement of UEs or LEs. Unable to assist with mobility much at all, very weak grip. Generally very weak and uncomfortable     General Comments       Exercises     Shoulder Instructions      Home Living Family/patient expects to be discharged to:: Skilled nursing facility                             Home Equipment: Rollator (4 wheels);BSC/3in1;Rolling Walker (2 wheels)   Additional Comments: patient reports she lives with her son. Pt reports that she came here from home and that she was unable to get OOB or perform her ADLs prior to this  admission.      Prior Functioning/Environment Prior Level of Function : Needs assist             Mobility Comments: reports she was unable to walk or get out of bed ADLs Comments: needs assist        OT Problem List: Decreased strength;Decreased range of motion;Decreased activity tolerance;Impaired balance (sitting and/or standing);Decreased coordination;Decreased cognition;Decreased safety awareness;Decreased knowledge of use of DME or AE;Decreased knowledge of precautions;Pain;Increased edema      OT Treatment/Interventions: Self-care/ADL training;Therapeutic exercise;DME and/or AE instruction;Therapeutic activities;Patient/family education;Balance training    OT Goals(Current goals can be found in the care plan section) Acute Rehab OT Goals Patient Stated Goal: "get better, go home" OT Goal Formulation: With patient Time For Goal Achievement: 08/24/21 Potential to Achieve Goals: Fair ADL Goals Pt Will Perform Grooming: with min assist;sitting Pt Will Perform Upper Body Bathing: with mod assist;sitting Pt Will Perform Upper Body Dressing: with max assist;with mod assist;sitting Pt Will Transfer to Toilet: with max assist;with mod assist;with +2 assist;stand pivot transfer Additional ADL Goal #1: Pt will complete bed mobility max - mod A in to sit EOB in prep for ADL tasks Additional ADL Goal #2: Pt will sit EOB x 5-10 minutes for grooming and UB ADLS with mod/min A for balance and support  OT Frequency: Min 2X/week    Co-evaluation  AM-PAC OT "6 Clicks" Daily Activity     Outcome Measure Help from another person eating meals?: A Little Help from another person taking care of personal grooming?: A Little Help from another person toileting, which includes using toliet, bedpan, or urinal?: Total Help from another person bathing (including washing, rinsing, drying)?: Total Help from another person to put on and taking off regular upper body clothing?: A  Lot Help from another person to put on and taking off regular lower body clothing?: Total 6 Click Score: 11   End of Session    Activity Tolerance: Patient limited by fatigue;Patient limited by pain Patient left: in bed;with call bell/phone within reach;with bed alarm set  OT Visit Diagnosis: Other abnormalities of gait and mobility (R26.89);Muscle weakness (generalized) (M62.81);Adult, failure to thrive (R62.7);Pain Pain - part of body:  (generalized)                Time: 1324-4010 OT Time Calculation (min): 26 min Charges:  OT General Charges $OT Visit: 1 Visit OT Evaluation $OT Eval Moderate Complexity: 1 Mod OT Treatments $Therapeutic Activity: 8-22 mins    Britt Bottom 08/10/2021, 12:53 PM

## 2021-08-11 DIAGNOSIS — R4182 Altered mental status, unspecified: Secondary | ICD-10-CM | POA: Diagnosis not present

## 2021-08-11 DIAGNOSIS — R1312 Dysphagia, oropharyngeal phase: Secondary | ICD-10-CM | POA: Diagnosis not present

## 2021-08-11 DIAGNOSIS — R627 Adult failure to thrive: Secondary | ICD-10-CM | POA: Diagnosis not present

## 2021-08-11 DIAGNOSIS — N179 Acute kidney failure, unspecified: Secondary | ICD-10-CM | POA: Diagnosis not present

## 2021-08-11 DIAGNOSIS — A419 Sepsis, unspecified organism: Secondary | ICD-10-CM | POA: Diagnosis not present

## 2021-08-11 LAB — GLUCOSE, CAPILLARY
Glucose-Capillary: 109 mg/dL — ABNORMAL HIGH (ref 70–99)
Glucose-Capillary: 112 mg/dL — ABNORMAL HIGH (ref 70–99)
Glucose-Capillary: 127 mg/dL — ABNORMAL HIGH (ref 70–99)
Glucose-Capillary: 111 mg/dL — ABNORMAL HIGH (ref 70–99)
Glucose-Capillary: 110 mg/dL — ABNORMAL HIGH (ref 70–99)

## 2021-08-11 LAB — RENAL FUNCTION PANEL
Albumin: <1.5 g/dL — ABNORMAL LOW (ref 3.5–5.0)
Anion gap: 3 — ABNORMAL LOW (ref 5–15)
BUN: 6 mg/dL — ABNORMAL LOW (ref 8–23)
CO2: 25 mmol/L (ref 22–32)
Calcium: 7.3 mg/dL — ABNORMAL LOW (ref 8.9–10.3)
Chloride: 113 mmol/L — ABNORMAL HIGH (ref 98–111)
Creatinine, Ser: 0.46 mg/dL (ref 0.44–1.00)
GFR, Estimated: >60 mL/min (ref >60)
Glucose, Bld: 149 mg/dL — ABNORMAL HIGH (ref 70–99)
Phosphorus: 2 mg/dL — ABNORMAL LOW (ref 2.5–4.6)
Potassium: 4.3 mmol/L (ref 3.5–5.1)
Sodium: 141 mmol/L (ref 135–145)

## 2021-08-11 MED ORDER — ZINC OXIDE 40 % EX OINT
TOPICAL_OINTMENT | Freq: Four times a day (QID) | CUTANEOUS | Status: DC
  Administered 2021-08-23: 1 via TOPICAL
  Filled 2021-08-11 (×5): qty 57

## 2021-08-11 MED ORDER — ALBUMIN HUMAN 25 % IV SOLN
25.0000 g | Freq: Two times a day (BID) | INTRAVENOUS | Status: AC
  Administered 2021-08-11 – 2021-08-13 (×6): 25 g via INTRAVENOUS
  Filled 2021-08-11 (×6): qty 100

## 2021-08-11 NOTE — Progress Notes (Signed)
Palliative Medicine Progress Note   Patient Name: [name] [name]       Date: 08/11/2021 DOB: [birth year]  Age: 61 y.o. MRN#: [id number] Attending Physician: Eugenie Filler, MD Primary Care Physician: Leeroy Cha, MD Admit Date: 08/05/2021    HPI/Patient Profile: 61 y.o. female  with past medical history of failure to thrive, dysphagia secondary to moderate esophageal dysmotility (barium swallow March 2023), psoriasis arthritis, CAD status post CABG, DM-2, and chronic systolic CHF LVEF 99% admitted on 08/05/2021 with AMS and fever.  Patient recently hospitalized with pneumonia and was discharged to a nursing home.  Patient has lost about 13 kg in the last 5 months per chart review.  Family reports several choking episodes at rehab.  This admission, patient diagnosed with severe sepsis secondary to ESBL E. coli bacteremia and aspiration pneumonia.  Patient with albumin less than 1.5 and ongoing severe protein calorie malnutrition.  PMT consulted to discuss goals of care.   Subjective: Patient is sleeping, but awakens easily to voice.  She tells me she is feeling "fine".  She is oriented x3.  She is able to tell me that she is in the hospital due to swallowing issues.  We discussed that she has a temporary feeding tube in place in order to provide nutrition to allow time for improvement as we continue to treat her sepsis and pneumonia.  Briefly discussed possibility of needing a permanent feeding tube (PEG tube).  [name] [name] clearly states she would not want a permanent feeding tube.  I reviewed [facility] and noted that [name] [name] has a MOST form on file from October 2022 with the following treatment decisions outlined: Cardiopulmonary Resuscitation: Attempt Resuscitation (CPR)  Medical Interventions:  Full Scope of Treatment: Use intubation, advanced airway interventions, mechanical ventilation, cardioversion as indicated, medical treatment, IV fluids, etc, also provide comfort measures. Transfer to the hospital if indicated  Antibiotics: Antibiotics if indicated  IV Fluids: IV fluids if indicated  Feeding Tube: Feeding tube for a defined trial period   I later spoke with her son [name] [name] by phone. I provided a brief update on [name] [name]'s condition, letting him know that her mental status seems improved.  I also let him know that [name] [name] clearly stated she would not want a permanent feeding tube, which is consistent with the MOST form on file.  I expressed  concern regarding [name] [name]'s significant ongoing functional decline, significant dysphagia, and ongoing malnutrition.  Discussed possible cycle of recurrent aspiration pneumonia and need for ongoing goals of care discussions regarding overall prognosis.   Objective:  Physical Exam Vitals reviewed.  Constitutional:      General: She is not in acute distress.    Appearance: She is ill-appearing.  Pulmonary:     Effort: Pulmonary effort is normal.  Neurological:     Mental Status: She is alert and oriented to person, place, and time.            Vital Signs: BP 123/72 (BP Location: Right Arm)   Pulse 82   Temp 98 F (36.7 C) (Oral)   Resp (!) 21   Wt 72.2 kg   SpO2 99%   BMI 25.69 kg/m  SpO2: SpO2: 99 % O2 Device: O2 Device: Nasal Cannula O2 Flow Rate: O2 Flow Rate (L/min): 3 L/min    LBM: Last BM Date : 08/10/21     Palliative Assessment/Data: PPS 30%     Palliative Medicine Assessment & Plan   Assessment: Principal Problem:   Sepsis ([place]) Active Problems:   Rheumatoid arthritis ([facility])   Gastroesophageal reflux disease without esophagitis   AMS (altered mental status)   Hypokalemia   Hypernatremia   Pressure injury of skin   Malnutrition of moderate degree   AKI (acute kidney injury) ([place])   Adult failure to thrive  syndrome   Dysphagia   Chronic systolic CHF (congestive heart failure) (HCC)   Infection due to ESBL-producing Escherichia coli   Hypophosphatemia   Folate deficiency    Recommendations/Plan: Full code Continue current interventions Patient states she would not want a PEG tube, this is also consistent with MOST form on file from 2022 Plan to meet with son and patient at bedside tomorrow at 45 AM   Prognosis:  Unable to determine    Thank you for allowing the Palliative Medicine Team to assist in the care of this patient.   MDM - moderate   [name] Bullion, NP   Please contact Palliative Medicine Team phone at [phone] for questions and concerns.  For individual providers, please see AMION.

## 2021-08-11 NOTE — Consult Note (Signed)
[facility] Nurse Consult Note: Reason for Consult:New areas of skin loss secondary to irritant contact dermatitis from contact with fecal incontinence Patient was seen earlier this week by my associate for two Stage 2 PIs at the right buttock and right hip. New area is moisture-related. Discussed with Bedside RN S. Delena Bali via [employer].  ICD-10 CM Codes for Irritant Dermatitis  L24A2 - Due to fecal, urinary or dual incontinence  Wound type:Irritant contact dermatitis Pressure Injury POA: N/A Measurement:To be obtained by Bedside RN S. Delena Bali later today. Wound EYE:MVVK, moist Drainage (amount, consistency, odor) scant serous Periwound: As noted above, patient with Stage 2 PI to right buttock and right hip Dressing procedure/placement/frequency: I will today provide a mattress replacement to assist with the management of microclimate. Patient must continue to be turned and repositioned every two hours per protocol for pressure redistribution. Time in the supine position is to be minimized. Bilateral pressure redistribution heel boots are also provided.   [facility] nursing team will not follow, but will remain available to this patient, the nursing and medical teams.  Please re-consult if needed. Thanks, Maudie Flakes, MSN, RN, [place], Arther Abbott  Pager# [phone]

## 2021-08-11 NOTE — Progress Notes (Addendum)
PROGRESS NOTE    [name] [name]  STM:[id number] DOB: April 28, 1960 DOA: 08/05/2021 PCP: Leeroy Cha, MD    Chief Complaint  Patient presents with   Altered Mental Status    Brief Narrative:  HPI per Dr. Wynetta Fines on 08/05/21 [name] [name] is a 61 y.o. female with medical history significant of failure to thrive, dysphagia secondary to moderate esophageal dysmotility (barium swallow March 2023), psoriasis arthritis, CAD status post CABG, DM-2, chronic systolic CHF LVEF 89%, was sent to hospital for evaluation of altered mentations hypoxia and fever.   Patient was recently hospitalized and discharged to nursing home 11 days ago after treatment of supposed empyema and pneumonia.  Patient has established history of failure to thrive and has been losing weight, and according to the record she lost about 13 kg in last 37-month.    Patient unable to provide any history as she is confused and agitated.  All history provided by patient and [name] [name]([phone], and patient was discharged on soft diet, last week, her diet was downgraded to pured.  Despite, family observed several times patient had choking episode after eating for food.  This morning, family was contacted about patient had after mentations and hypoxia   ED Course: Patient was found hypoxic initially briefly on 15 L of nonrebreather and titrated down to 5 L, tachycardia hypotensive but responded to IV fluid.  Febrile 101.4   Angiogram negative for PE but patchy infiltrates on bilateral lower lung fields suspicious for pneumonia.  Small right-sided pleural effusion.   Patient was started on vancomycin and cefepime.  Sodium 149, K5.2, creatinine 1.5, WBC 12.2.   **Interim History Patient feels short of breath but oxygen requirement has been weaned from 5 L to 3 L.  Still having difficulty swallowing and managing secretions so she is made n.p.o.  Discussed with daughter about a small bore feeding tube and she may be agreeable  for tomorrow.  She is found to have an ESBL E. coli bacteremia and is now on IV meropenem     Assessment & Plan:   Principal Problem:   Sepsis ([facility] Active Problems:   Rheumatoid arthritis ([facility]   Gastroesophageal reflux disease without esophagitis   AMS (altered mental status)   Hypokalemia   Hypernatremia   Pressure injury of skin   Malnutrition of moderate degree   AKI (acute kidney injury) ([facility]   Adult failure to thrive syndrome   Dysphagia   Chronic systolic CHF (congestive heart failure) (HCC)   Infection due to ESBL-producing Escherichia coli   Hypophosphatemia   Folate deficiency  #1 severe sepsis secondary to ESBL E. coli bacteremia, aspiration pneumonia, possible UTI/acute respiratory failure with hypoxia/lactic acidosis. -Patient noted to have met criteria for sepsis with tachycardia, new onset hypoxia, fever, suspected source noted to be pneumonia, bacteremia UTI.  Patient noted with signs of endorgan damage of AKI and acute encephalopathy. -Improving clinically. -Patient with history of aspiration and radiologic pattern of pneumonia with concern for aspiration pneumonia. -MRSA PCR positive. -Patient was on IV vancomycin, IV cefepime and IV cefepime changed to IV Merrem due to ESBL E. coli. -Patient seen by speech therapy and made n.p.o. due to increased risk of aspiration and difficulty with clearing secretions. -Leukocytosis trending down. -Procalcitonin noted to be elevated but not repeated this morning. -Blood cultures done positive for ESBL E. coli. -Patient with poor nutritional status, currently n.p.o., likely needs feeding tube in the future patient cannot meet calorie intake treatment with recurrent episodes of aspiration  despite being placed on a dysphagia 1 diet. -Feeding tube ordered. -Patient seen in consultation by ID and IV vancomycin has been discontinued and recommending continuation of Merrem to treat for total of 7 days. -Supportive care..  2.   Failure to thrive/dysphagia/severe protein calorie malnutrition, POA -Patient noted to be cachectic, with some temporal wasting, failure to thrive. -Patient with esophageal dysmotility. -Patient noted to have had a swallow evaluation in March noting moderate dysmotility concern for inadequate nutrition as albumin noted to < 1.5. -Cortrack ordered and placed 08/07/2021. -Consulted with dietitian to start tube feeds. -Status post IV albumin every 6 hours x24 hours.  -Albumin levels < 1.5. -Status post course of fluconazole for Candida esophagitis during last hospitalization. -Palliative care consulted and following. -Will need consult with IR for PEG tube placement after palliative care consultation completed if patient desires.  3.  Acute kidney injury -Likely secondary to prerenal azotemia in the setting of severe dehydration, volume contraction. -Patient noted to have been on half-normal saline which was changed to D5W. -Renal function improved with hydration. -Avoid nephrotoxins. -Saline lock IV fluids.  -Follow.  4.  Hypernatremia -Secondary to dehydration, failure to thrive, volume depletion.  -Improved initially on D5W, subsequently on D5 half-normal saline with resolution of hypernatremia with sodium at 141 this morning.  -Saline lock IV fluids.   -Follow.  5.  UTI with hematuria versus bacteriuria -Is noted to be turbid, moderate leukocytes, nitrite negative, 21-50 WBCs, WBC clumps noted, many bacteria. -Urine cultures sent 08/05/2021-seems as if they were canceled.  -Continue IV Merrem.    6.  Hyperkalemia>>>> hypokalemia/hypophosphatemia -Likely secondary to acute kidney injury. -Hyperkalemia improved currently resolved patient noted to be hypokalemic and potassium repleted currently at 4.3.   -Phosphorus level at 2.0 from 1.2.   -Status post sodium phosphate 30 mmol IV x1. -Continue phosphorus supplementation. -Repeat labs in the AM.  7.  Acute metabolic  encephalopathy -Likely secondary to sepsis/acute infection. -Clinical improvement.   -Continue IV antibiotics, tube feeds.   -Follow.  8.  Hypertension -Patient currently n.p.o. with esophageal dysmotility. -Continue home regimen oral Lopressor for BP control.   9.  Chronic systolic CHF -Patient dehydrated, volume contracted and also with hypoxia from pneumonia. -Discontinue IV fluids.   -No signs of acute decompensation.   -Follow.    10.  Psoriasis arthritis -No clear etiology of esophageal dysmotility, unsure whether related to psoriatic arthritis which is likely poorly controlled. -We will need outpatient follow-up with rheumatology.  11.  Thrombocytopenia -In the setting of sepsis. -Platelet count fluctuating.   12.  GERD -PPI.    13.  Anemia./Folate deficiency -Patient with no overt bleeding. -Likely dilutional component. -Anemia panel with iron level of 19, folate of 4.0, ferritin at 1850, TIBC not calculated.  Vitamin B12 levels of 421.  -H&H stable at 10.  -Continue folic acid 1 mg daily.  -Transfusion threshold hemoglobin < 7.  14.  Pseudohypocalcemia -Corrected calcium at 9.30.  15.  Failure to thrive/malnutrition/severe protein calorie malnutrition -Patient noted with esophageal dysmotility and nausea and decreased oral intake.  Patient required core track for nutrition during this hospitalization. -Albumin levels < 1.5. -IV albumin twice daily x3 days. -May need PEG tube placement however will defer until discussions are completed. -Palliative care consulted and following.   14.  Pressure injury, POA Pressure Injury 08/05/21 Buttocks Right Stage 2 -  Partial thickness loss of dermis presenting as a shallow open injury with a red, pink wound bed without slough. red, no  drainage (Active)  08/05/21 1835  Location: Buttocks  Location Orientation: Right  Staging: Stage 2 -  Partial thickness loss of dermis presenting as a shallow open injury with a red, pink  wound bed without slough.  Wound Description (Comments): red, no drainage  Present on Admission: Yes     Pressure Injury 08/05/21 Hip Right Stage 2 -  Partial thickness loss of dermis presenting as a shallow open injury with a red, pink wound bed without slough. (Active)  08/05/21 1837  Location: Hip  Location Orientation: Right  Staging: Stage 2 -  Partial thickness loss of dermis presenting as a shallow open injury with a red, pink wound bed without slough.  Wound Description (Comments):   Present on Admission: Yes         DVT prophylaxis: Heparin Code Status: Full Family Communication: Updated patient.  Updated son Elizabth Palka on the telephone.   Disposition: Likely back to SNF.  Status is: Inpatient Remains inpatient appropriate because: Severity of illness.   Consultants:  ID: Dr. Juleen China 08/07/2021 [place] Palliative care pending  Procedures:  CT angiogram chest 08/05/2021 Chest x-ray 08/05/2021, 08/06/2021 Abdominal films 08/07/2021 Cortrack placement 08/07/2021   Antimicrobials:  IV cefepime 08/05/2021>>>> 08/06/2021 IV Merrem 08/06/2021>>>> 08/13/2021 IV vancomycin 08/05/2021>>>> 08/07/2021   Subjective: Laying in bed.  Tearful.  States she wants to go home.  No chest pain.  No shortness of breath.  Cortrack in place.  Objective: Vitals:   08/10/21 1305 08/10/21 2136 08/11/21 0500 08/11/21 0758  BP: 110/75 119/73  123/72  Pulse:  95  82  Resp: (!) 24 19  (!) 21  Temp:  98.4 F (36.9 C)  98 F (36.7 C)  TempSrc:  Oral  Oral  SpO2:  98%  99%  Weight:   72 kg 72.2 kg    Intake/Output Summary (Last 24 hours) at 08/11/2021 1250 Last data filed at 08/10/2021 2300 Gross per 24 hour  Intake 2935 ml  Output 0 ml  Net 2935 ml    Filed Weights   08/10/21 0406 08/11/21 0500 08/11/21 0758  Weight: 74.6 kg 72 kg 72.2 kg    Examination:  General exam: Frail.  Cachectic.  Some temporal wasting. Cortrack in place. Respiratory system: Some scattered coarse breath  sounds anterior lung fields.  No wheezes.  No crackles.  Fair air movement.   Cardiovascular system: RRR no murmurs rubs or gallops.  No JVD.  No lower extremity edema.  Gastrointestinal system: Abdomen is soft, nontender, nondistended, positive bowel sounds.  No rebound.  No guarding.  Central nervous system: Alert and oriented x3. No focal neurological deficits. Extremities: Symmetric 5 x 5 power. Skin: No rashes, lesions or ulcers Psychiatry: Judgement and insight appear normal. Mood & affect appropriate.     Data Reviewed: I have personally reviewed following labs and imaging studies  CBC: Recent Labs  Lab 08/05/21 1107 08/05/21 1153 08/06/21 0011 08/07/21 0409 08/08/21 0416 08/09/21 0638 08/10/21 0236  WBC 12.2*  --  15.8* 6.6 7.4 5.6 6.0  NEUTROABS 10.9*  --   --  5.6  --   --   --   HGB 13.7   < > 12.1 13.2 10.1* 11.6* 10.0*  HCT 44.4   < > 38.2 43.3 31.9* 36.4 30.9*  MCV 95.5  --  96.5 97.3 96.1 94.5 93.1  PLT 179  --  140* 163 148* 124* 120*   < > = values in this interval not displayed.     Basic Metabolic Panel:  Recent Labs  Lab 08/07/21 0409 08/07/21 1826 08/08/21 0416 08/08/21 1515 08/09/21 0638 08/10/21 0236 08/11/21 0252  NA 149*  --  141  --  139 134* 141  K 3.8  --  3.0*  --  4.0 4.1 4.3  CL 117*  --  113*  --  112* 107 113*  CO2 26  --  25  --  _0 GLUCOSE 63*  --  150*  --  233* 168* 149*  BUN 21  --  14  --  8 7* 6*  CREATININE 0.91  --  0.71  --  0.51 0.44 0.46  CALCIUM 8.4*  --  8.1*  --  7.5* 6.9* 7.3*  MG 2.2 1.9 1.9 1.8 1.7 2.0  --   PHOS 2.5 1.8* 1.6* 2.6 1.3*  1.3* 1.2* 2.0*     GFR: Estimated Creatinine Clearance: 74.2 mL/min (by C-G formula based on SCr of 0.46 mg/dL).  Liver Function Tests: Recent Labs  Lab 08/05/21 1107 08/07/21 0409 08/08/21 0416 08/09/21 0638 08/10/21 0236 08/11/21 0252  AST 75* 50*  --   --   --   --   ALT 21 17  --   --   --   --   ALKPHOS 223* 169*  --   --   --   --   BILITOT 1.2 0.3  --    --   --   --   PROT 7.1 6.6  --   --   --   --   ALBUMIN 1.5* <1.5* 2.1* 1.7* <1.5* <1.5*     CBG: Recent Labs  Lab 08/10/21 1700 08/10/21 2219 08/11/21 0556 08/11/21 0756 08/11/21 1143  GLUCAP 104* 118* 109* 112* 127*      Recent Results (from the past 240 hour(s))  Resp Panel by RT-PCR (Flu A&B, Covid) Nasopharyngeal Swab     Status: None   Collection Time: 08/05/21 10:46 AM   Specimen: Nasopharyngeal Swab; Nasopharyngeal(NP) swabs in vial transport medium  Result Value Ref Range Status   SARS Coronavirus 2 by RT PCR NEGATIVE NEGATIVE Final    Comment: (NOTE) SARS-CoV-2 target nucleic acids are NOT DETECTED.  The SARS-CoV-2 RNA is generally detectable in upper respiratory specimens during the acute phase of infection. The lowest concentration of SARS-CoV-2 viral copies this assay can detect is 138 copies/mL. A negative result does not preclude SARS-Cov-2 infection and should not be used as the sole basis for treatment or other patient management decisions. A negative result may occur with  improper specimen collection/handling, submission of specimen other than nasopharyngeal swab, presence of viral mutation(s) within the areas targeted by this assay, and inadequate number of viral copies(<138 copies/mL). A negative result must be combined with clinical observations, patient history, and epidemiological information. The expected result is Negative.  Fact Sheet for Patients:  EntrepreneurPulse.com.au  Fact Sheet for Healthcare Providers:  IncredibleEmployment.be  This test is no t yet approved or cleared by the [place] FDA and  has been authorized for detection and/or diagnosis of SARS-CoV-2 by FDA under an Emergency Use Authorization (EUA). This EUA will remain  in effect (meaning this test can be used) for the duration of the COVID-19 declaration under Section 564(b)(1) of the Act, 21 U.S.C.section 360bbb-3(b)(1),  unless the authorization is terminated  or revoked sooner.       Influenza A by PCR NEGATIVE NEGATIVE Final   Influenza B by PCR NEGATIVE NEGATIVE Final    Comment: (NOTE) The Xpert Xpress  SARS-CoV-2/FLU/RSV plus assay is intended as an aid in the diagnosis of influenza from Nasopharyngeal swab specimens and should not be used as a sole basis for treatment. Nasal washings and aspirates are unacceptable for Xpert Xpress SARS-CoV-2/FLU/RSV testing.  Fact Sheet for Patients: EntrepreneurPulse.com.au  Fact Sheet for Healthcare Providers: IncredibleEmployment.be  This test is not yet approved or cleared by the [place] FDA and has been authorized for detection and/or diagnosis of SARS-CoV-2 by FDA under an Emergency Use Authorization (EUA). This EUA will remain in effect (meaning this test can be used) for the duration of the COVID-19 declaration under Section 564(b)(1) of the Act, 21 U.S.C. section 360bbb-3(b)(1), unless the authorization is terminated or revoked.  Performed at [facility], [place] [address], [place], [place] [address]   Blood Culture (routine x 2)     Status: Abnormal   Collection Time: 08/05/21 10:46 AM   Specimen: BLOOD  Result Value Ref Range Status   Specimen Description BLOOD BLOOD RIGHT WRIST  Final   Special Requests   Final    BOTTLES DRAWN AEROBIC AND ANAEROBIC Blood Culture adequate volume   Culture  Setup Time   Final    GRAM NEGATIVE RODS IN BOTH AEROBIC AND ANAEROBIC BOTTLES CRITICAL VALUE NOTED.  VALUE IS CONSISTENT WITH PREVIOUSLY REPORTED AND CALLED VALUE.    Culture (A)  Final    ESCHERICHIA COLI SUSCEPTIBILITIES PERFORMED ON PREVIOUS CULTURE WITHIN THE LAST 5 DAYS. Performed at [facility], [place] [address] [place], [place] [address]    Report Status 08/08/2021 FINAL  Final  Blood Culture (routine x 2)     Status: Abnormal   Collection Time: 08/05/21 10:51 AM   Specimen: BLOOD  Result  Value Ref Range Status   Specimen Description BLOOD BLOOD LEFT WRIST  Final   Special Requests   Final    BOTTLES DRAWN AEROBIC AND ANAEROBIC Blood Culture adequate volume   Culture  Setup Time   Final    GRAM NEGATIVE RODS ANAEROBIC BOTTLE ONLY CRITICAL RESULT CALLED TO, READ BACK BY AND VERIFIED WITH: PHARMD ADRIENNE WELLBORN 08/06/21_0 :41 BY TW IN BOTH AEROBIC AND ANAEROBIC BOTTLES Performed at [facility], [place] [address], [place], [place] [address]    Culture (A)  Final    ESCHERICHIA COLI Confirmed Extended Spectrum Beta-Lactamase Producer (ESBL).  In bloodstream infections from ESBL organisms, carbapenems are preferred over piperacillin/tazobactam. They are shown to have a lower risk of mortality.    Report Status 08/08/2021 FINAL  Final   Organism ID, Bacteria ESCHERICHIA COLI  Final      Susceptibility   Escherichia coli - MIC*    AMPICILLIN >=32 RESISTANT Resistant     CEFAZOLIN >=64 RESISTANT Resistant     CEFEPIME >=32 RESISTANT Resistant     CEFTAZIDIME >=64 RESISTANT Resistant     CEFTRIAXONE >=64 RESISTANT Resistant     CIPROFLOXACIN >=4 RESISTANT Resistant     GENTAMICIN <=1 SENSITIVE Sensitive     IMIPENEM <=0.25 SENSITIVE Sensitive     TRIMETH/SULFA >=320 RESISTANT Resistant     AMPICILLIN/SULBACTAM >=32 RESISTANT Resistant     PIP/TAZO 16 SENSITIVE Sensitive     * ESCHERICHIA COLI  Blood Culture ID Panel (Reflexed)     Status: Abnormal   Collection Time: 08/05/21 10:51 AM  Result Value Ref Range Status   Enterococcus faecalis NOT DETECTED NOT DETECTED Final   Enterococcus Faecium NOT DETECTED NOT DETECTED Final   Listeria monocytogenes NOT DETECTED NOT DETECTED Final  Staphylococcus species NOT DETECTED NOT DETECTED Final   Staphylococcus aureus (BCID) NOT DETECTED NOT DETECTED Final   Staphylococcus epidermidis NOT DETECTED NOT DETECTED Final   Staphylococcus lugdunensis NOT DETECTED NOT DETECTED Final   Streptococcus species NOT DETECTED NOT  DETECTED Final   Streptococcus agalactiae NOT DETECTED NOT DETECTED Final   Streptococcus pneumoniae NOT DETECTED NOT DETECTED Final   Streptococcus pyogenes NOT DETECTED NOT DETECTED Final   A.calcoaceticus-baumannii NOT DETECTED NOT DETECTED Final   Bacteroides fragilis NOT DETECTED NOT DETECTED Final   Enterobacterales DETECTED (A) NOT DETECTED Final    Comment: Enterobacterales represent a large order of gram negative bacteria, not a single organism. CRITICAL RESULT CALLED TO, READ BACK BY AND VERIFIED WITH: PHARMD ADRIENNE WELLBORN 08/06/21_0 :41 BY TW    Enterobacter cloacae complex NOT DETECTED NOT DETECTED Final   Escherichia coli DETECTED (A) NOT DETECTED Final    Comment: CRITICAL RESULT CALLED TO, READ BACK BY AND VERIFIED WITH: PHARMD ADRIENNE WELLBORN 08/06/21_1 :41 BY TW    Klebsiella aerogenes NOT DETECTED NOT DETECTED Final   Klebsiella oxytoca NOT DETECTED NOT DETECTED Final   Klebsiella pneumoniae NOT DETECTED NOT DETECTED Final   Proteus species NOT DETECTED NOT DETECTED Final   Salmonella species NOT DETECTED NOT DETECTED Final   Serratia marcescens NOT DETECTED NOT DETECTED Final   Haemophilus influenzae NOT DETECTED NOT DETECTED Final   Neisseria meningitidis NOT DETECTED NOT DETECTED Final   Pseudomonas aeruginosa NOT DETECTED NOT DETECTED Final   Stenotrophomonas maltophilia NOT DETECTED NOT DETECTED Final   Candida albicans NOT DETECTED NOT DETECTED Final   Candida auris NOT DETECTED NOT DETECTED Final   Candida glabrata NOT DETECTED NOT DETECTED Final   Candida krusei NOT DETECTED NOT DETECTED Final   Candida parapsilosis NOT DETECTED NOT DETECTED Final   Candida tropicalis NOT DETECTED NOT DETECTED Final   Cryptococcus neoformans/gattii NOT DETECTED NOT DETECTED Final   CTX-M ESBL DETECTED (A) NOT DETECTED Final    Comment: CRITICAL RESULT CALLED TO, READ BACK BY AND VERIFIED WITH: PHARMD ADRIENNE WELLBORN 08/06/21_2 :41 BY TW (NOTE) Extended spectrum  beta-lactamase detected. Recommend a carbapenem as initial therapy.      Carbapenem resistance IMP NOT DETECTED NOT DETECTED Final   Carbapenem resistance KPC NOT DETECTED NOT DETECTED Final   Carbapenem resistance NDM NOT DETECTED NOT DETECTED Final   Carbapenem resist OXA 48 LIKE NOT DETECTED NOT DETECTED Final   Carbapenem resistance VIM NOT DETECTED NOT DETECTED Final    Comment: Performed at [facility], [place] [address] [place], [place] [address]  MRSA Next Gen by PCR, Nasal     Status: Abnormal   Collection Time: 08/06/21  3:00 AM   Specimen: Nasal Mucosa; Nasal Swab  Result Value Ref Range Status   MRSA by PCR Next Gen DETECTED (A) NOT DETECTED Final    Comment: RESULT CALLED TO, READ BACK BY AND VERIFIED WITH: RN Heloise Ochoa 08/06/21_3 :41 BY TW (NOTE) The GeneXpert MRSA Assay (FDA approved for NASAL specimens only), is one component of a comprehensive MRSA colonization surveillance program. It is not intended to diagnose MRSA infection nor to guide or monitor treatment for MRSA infections. Test performance is not FDA approved in patients less than 75 years old. Performed at [facility], [place] [address] [place], [place] [address]           Radiology Studies: No results found.      Scheduled Meds:  aspirin  81 mg Per Tube Daily   atorvastatin  80 mg Per Tube Daily   escitalopram  10 mg Per Tube QHS   feeding supplement (OSMOLITE 1.5 CAL)  1,000 mL Per Tube Q24H   feeding supplement (PROSource TF)  45 mL Per Tube Daily   folic acid  1 mg Per Tube Daily   free water  175 mL Per Tube Q4H   gabapentin  100 mg Per Tube Q12H   heparin  5,000 Units Subcutaneous Q8H   liver oil-zinc oxide   Topical QID   metoprolol tartrate  25 mg Per Tube BID   pantoprazole sodium  40 mg Per Tube Daily   polyethylene glycol  17 g Per Tube Daily   potassium & sodium phosphates  2 packet Per Tube TID   Continuous Infusions:  albumin human 25 g (08/11/21 0902)    meropenem (MERREM) IV 1 g (08/11/21 0553)     LOS: 6 days    Time spent: 35 minutes    Irine Seal, MD [facility]   To contact the attending provider between 7A-7P or the covering provider during after hours 7P-7A, please log into the web site [email or web address] and access using universal [place] password for that web site. If you do not have the password, please call the hospital operator.  08/11/2021, 12:50 PM

## 2021-08-12 DIAGNOSIS — R627 Adult failure to thrive: Secondary | ICD-10-CM | POA: Diagnosis not present

## 2021-08-12 DIAGNOSIS — N179 Acute kidney failure, unspecified: Secondary | ICD-10-CM | POA: Diagnosis not present

## 2021-08-12 DIAGNOSIS — A419 Sepsis, unspecified organism: Secondary | ICD-10-CM | POA: Diagnosis not present

## 2021-08-12 DIAGNOSIS — I5022 Chronic systolic (congestive) heart failure: Secondary | ICD-10-CM | POA: Diagnosis not present

## 2021-08-12 DIAGNOSIS — R4182 Altered mental status, unspecified: Secondary | ICD-10-CM | POA: Diagnosis not present

## 2021-08-12 LAB — RENAL FUNCTION PANEL
Albumin: 2.1 g/dL — ABNORMAL LOW (ref 3.5–5.0)
Anion gap: 4 — ABNORMAL LOW (ref 5–15)
BUN: 6 mg/dL — ABNORMAL LOW (ref 8–23)
CO2: 25 mmol/L (ref 22–32)
Calcium: 7.9 mg/dL — ABNORMAL LOW (ref 8.9–10.3)
Chloride: 111 mmol/L (ref 98–111)
Creatinine, Ser: 0.42 mg/dL — ABNORMAL LOW (ref 0.44–1.00)
GFR, Estimated: >60 mL/min (ref >60)
Glucose, Bld: 125 mg/dL — ABNORMAL HIGH (ref 70–99)
Phosphorus: 1.5 mg/dL — ABNORMAL LOW (ref 2.5–4.6)
Potassium: 4.5 mmol/L (ref 3.5–5.1)
Sodium: 140 mmol/L (ref 135–145)

## 2021-08-12 LAB — GLUCOSE, CAPILLARY
Glucose-Capillary: 107 mg/dL — ABNORMAL HIGH (ref 70–99)
Glucose-Capillary: 114 mg/dL — ABNORMAL HIGH (ref 70–99)
Glucose-Capillary: 118 mg/dL — ABNORMAL HIGH (ref 70–99)
Glucose-Capillary: 123 mg/dL — ABNORMAL HIGH (ref 70–99)
Glucose-Capillary: 128 mg/dL — ABNORMAL HIGH (ref 70–99)
Glucose-Capillary: 129 mg/dL — ABNORMAL HIGH (ref 70–99)
Glucose-Capillary: 133 mg/dL — ABNORMAL HIGH (ref 70–99)

## 2021-08-12 LAB — MAGNESIUM: Magnesium: 2 mg/dL (ref 1.7–2.4)

## 2021-08-12 MED ORDER — SODIUM PHOSPHATES 45 MMOLE/15ML IV SOLN
30.0000 mmol | Freq: Two times a day (BID) | INTRAVENOUS | Status: AC
  Administered 2021-08-12 – 2021-08-13 (×2): 30 mmol via INTRAVENOUS
  Filled 2021-08-12 (×2): qty 10

## 2021-08-12 MED ORDER — LIP MEDEX EX OINT
TOPICAL_OINTMENT | CUTANEOUS | Status: DC | PRN
  Filled 2021-08-12: qty 7

## 2021-08-12 MED ORDER — LOPERAMIDE HCL 1 MG/7.5ML PO SUSP
2.0000 mg | ORAL | Status: DC | PRN
Start: 2021-08-12 — End: 2021-08-26
  Administered 2021-08-12 – 2021-08-26 (×12): 2 mg
  Filled 2021-08-12 (×23): qty 15

## 2021-08-12 NOTE — Plan of Care (Signed)

## 2021-08-12 NOTE — Progress Notes (Signed)
Palliative Medicine Progress Note   Patient Name: [name] [name]       Date: 08/12/2021 DOB: 04/24/1960  Age: 61 y.o. MRN#: [id number] Attending Physician: Eugenie Filler, MD Primary Care Physician: Leeroy Cha, MD Admit Date: 08/05/2021    HPI/Patient Profile: 61 y.o. female  with past medical history of failure to thrive, dysphagia secondary to moderate esophageal dysmotility (barium swallow March 2023), psoriasis arthritis, CAD status post CABG, DM-2, and chronic systolic CHF LVEF 50% admitted on 08/05/2021 with AMS and fever.  Patient recently hospitalized with pneumonia and was discharged to a nursing home.  Patient has lost about 13 kg in the last 5 months per chart review.  Family reports several choking episodes at rehab.  This admission, patient diagnosed with severe sepsis secondary to ESBL E. coli bacteremia and aspiration pneumonia.  Patient with albumin less than 1.5 and ongoing severe protein calorie malnutrition.  PMT consulted to discuss goals of care.   Family Meeting: I met today at bedside with Diara, her son [name], and her son [name].  Both sons significant others were also present.   We discussed patient's current illness and what it means in the larger context of her ongoing co-morbidities.  We reviewed the modified barium swallow in March 2023 that showed esophageal dysmotility.  We discussed that she continues to have significant swallowing issues, which leads to aspiration pneumonia and sepsis.  We discussed her ongoing poor nutritional status and failure to thrive.  We discussed that patient's dysphagia is not reversible.  We discussed that patient is currently on a temporary feeding tube for nutrition.  We discussed that Talyah has expressed that she does not  want a permanent feeding tube.  She continues to express this in front of her family.  Discussed benefits versus burdens of artificial feeding. Emphasized that artificial feeding has not been shown to prolong life or promote quality of life in patients with advanced illness. Discussed that artificial feeding does not eliminate the risk of aspiration.   The difference between full scope medical intervention and comfort care was considered.  I introduced the concept of a comfort path to family, emphasizing that this path involves de-escalating and stopping full scope medical interventions, allowing a natural course to occur. Discussed that the goal is comfort and dignity rather than cure/prolonging life.  Discussed  that without a feeding tube, patient will continue to aspirate and her oral intake will likely not be enough to sustain her long-term.  Discussed the following options: Full scope care with PEG tube and SNF placement versus Comfort care, hospice, and comfort feeds  Both sons are understandably emotional and tearful.  Outside the room, they asked me how long she would likely live with and without a permanent feeding tube.  Discussed that it is difficult to determine, but given her overall weakened and frail state prognosis may be months months with a permanent feeding tube (but she would be living in a facility).  Discussed that without a feeding tube, prognosis was likely weeks.  [name] ultimately ask for "something in writing" regarding his mothers diagnosis of esophageal dysmotility.  He would be interested in obtaining a second opinion (from [facility]) regarding possible treatment options.     Plan of care discussed with Dr. Grandville Silos, patient's bedside RN, and speech therapist Mickel Baas.   Objective:  Physical Exam Vitals reviewed.  Constitutional:      General: She is not in acute distress.    Appearance: She is ill-appearing.     Comments: Temporal wasting  HENT:     Head:      Comments: Cortrak in place Neurological:     Mental Status: She is alert and oriented to person, place, and time.     Motor: Weakness present.  Psychiatric:        Mood and Affect: Affect is tearful.            Vital Signs: BP 126/83 (BP Location: Left Arm)   Pulse 96   Temp 98 F (36.7 C) (Oral)   Resp (!) 25   Wt 69.8 kg   SpO2 96%   BMI 24.84 kg/m  SpO2: SpO2: 96 % O2 Device: O2 Device: Nasal Cannula O2 Flow Rate: O2 Flow Rate (L/min): 3 L/min    LBM: Last BM Date : 08/10/21   Palliative Medicine Assessment & Plan   Assessment: Principal Problem:   Sepsis ([place]) Active Problems:   Rheumatoid arthritis (HCC)   Gastroesophageal reflux disease without esophagitis   AMS (altered mental status)   Hypokalemia   Hypernatremia   Pressure injury of skin   Malnutrition of moderate degree   AKI (acute kidney injury) ([place])   Adult failure to thrive syndrome   Dysphagia   Chronic systolic CHF (congestive heart failure) (HCC)   Infection due to ESBL-producing Escherichia coli   Hypophosphatemia   Folate deficiency    Recommendations/Plan: Full code Continue current interventions Recommend GI consult Recommend MBS Patient continues to express she does not want a PEG tube Follow-up discussion with patient and son tomorrow   Prognosis:  Unable to determine, but overall poor  Discharge Planning: To Be Determined   Thank you for allowing the Palliative Medicine Team to assist in the care of this patient.    Greater than 50%  of this time was spent counseling and coordinating care related to the above assessment and plan.  Total time: 70 minutes   Lavena Bullion, NP Palliative Medicine   Please contact Palliative Medicine Team phone at [phone] for questions and concerns.  For individual provider, see AMION.

## 2021-08-12 NOTE — Progress Notes (Signed)
Pharmacy Antibiotic Note  [name] [name] is a 61 y.o. female admitted on 08/05/2021 with pneumonia.  Pt recently admitted for pneumonia and treated with levofloxacin 5/7-5/11 (x5d course). Pharmacy has been consulted for meropenem dosing. Patient has since been diagnosed with ESBL E.coli bacteremia.   WBC within normal limits on 5/27, patient is afebrile. Renal function stable.   Plan: Continue meropenem 1g Q8h until 5/31 Monitor daily WBC, temp, SCr, and clinical s/sx of infection  Weight: 69.8 kg (153 lb 14.1 oz)  Temp (24hrs), Avg:98.3 F (36.8 C), Min:98.1 F (36.7 C), Max:98.5 F (36.9 C)  Recent Labs  Lab 08/05/21 1059 08/05/21 1107 08/05/21 1525 08/06/21 0011 08/07/21 0409 08/08/21 0416 08/09/21 5284 08/10/21 0236 08/11/21 0252 08/12/21 0229  WBC  --    < >  --  15.8* 6.6 7.4 5.6 6.0  --   --   CREATININE  --    < >  --  1.17* 0.91 0.71 0.51 0.44 0.46 0.42*  LATICACIDVEN 2.5*  --  3.1*  --   --   --   --   --   --   --    < > = values in this interval not displayed.     Estimated Creatinine Clearance: 68.3 mL/min (A) (by C-G formula based on SCr of 0.42 mg/dL (L)).    Allergies  Allergen Reactions   Apremilast Other (See Comments)    unknown   Latex Itching, Swelling and Rash    Antimicrobials this admission: Cefepime 5/22  Vancomycin 5/22 >> 5/24 Meropenem 5/23 >> (5/31)  Dose adjustments this admission: N/A  Microbiology results: 5/22 BCx: 3 of 4 CTX-M E. coli 5/22 MRSA PCR: positive   Thank you for involving pharmacy in this patient's care.  Elita Quick, PharmD PGY1 Ambulatory Care Pharmacy Resident 08/12/2021 10:20 AM  **Pharmacist phone directory can be found on [email or web address] listed under [place]**

## 2021-08-12 NOTE — Progress Notes (Signed)
PROGRESS NOTE    [name]  STM:[id number] DOB: April 28, 1960 DOA: 08/05/2021 PCP: Leeroy Cha, MD    Chief Complaint  Patient presents with   Altered Mental Status    Brief Narrative:  HPI per Dr. Wynetta Fines on 08/05/21 [name] [name] is a 61 y.o. female with medical history significant of failure to thrive, dysphagia secondary to moderate esophageal dysmotility (barium swallow March 2023), psoriasis arthritis, CAD status post CABG, DM-2, chronic systolic CHF LVEF 89%, was sent to hospital for evaluation of altered mentations hypoxia and fever.   Patient was recently hospitalized and discharged to nursing home 11 days ago after treatment of supposed empyema and pneumonia.  Patient has established history of failure to thrive and has been losing weight, and according to the record she lost about 13 kg in last 37-month.    Patient unable to provide any history as she is confused and agitated.  All history provided by patient and [name]([phone], and patient was discharged on soft diet, last week, her diet was downgraded to pured.  Despite, family observed several times patient had choking episode after eating for food.  This morning, family was contacted about patient had after mentations and hypoxia   ED Course: Patient was found hypoxic initially briefly on 15 L of nonrebreather and titrated down to 5 L, tachycardia hypotensive but responded to IV fluid.  Febrile 101.4   Angiogram negative for PE but patchy infiltrates on bilateral lower lung fields suspicious for pneumonia.  Small right-sided pleural effusion.   Patient was started on vancomycin and cefepime.  Sodium 149, K5.2, creatinine 1.5, WBC 12.2.   **Interim History Patient feels short of breath but oxygen requirement has been weaned from 5 L to 3 L.  Still having difficulty swallowing and managing secretions so she is made n.p.o.  Discussed with daughter about a small bore feeding tube and she may be agreeable  for tomorrow.  She is found to have an ESBL E. coli bacteremia and is now on IV meropenem     Assessment & Plan:   Principal Problem:   Sepsis ([facility] Active Problems:   Rheumatoid arthritis ([facility]   Gastroesophageal reflux disease without esophagitis   AMS (altered mental status)   Hypokalemia   Hypernatremia   Pressure injury of skin   Malnutrition of moderate degree   AKI (acute kidney injury) ([facility]   Adult failure to thrive syndrome   Dysphagia   Chronic systolic CHF (congestive heart failure) (HCC)   Infection due to ESBL-producing Escherichia coli   Hypophosphatemia   Folate deficiency  #1 severe sepsis secondary to ESBL E. coli bacteremia, aspiration pneumonia, possible UTI/acute respiratory failure with hypoxia/lactic acidosis. -Patient noted to have met criteria for sepsis with tachycardia, new onset hypoxia, fever, suspected source noted to be pneumonia, bacteremia UTI.  Patient noted with signs of endorgan damage of AKI and acute encephalopathy. -Improving clinically. -Patient with history of aspiration and radiologic pattern of pneumonia with concern for aspiration pneumonia. -MRSA PCR positive. -Patient was on IV vancomycin, IV cefepime and IV cefepime changed to IV Merrem due to ESBL E. coli. -Patient seen by speech therapy and made n.p.o. due to increased risk of aspiration and difficulty with clearing secretions. -Leukocytosis trending down. -Procalcitonin noted to be elevated but not repeated this morning. -Blood cultures done positive for ESBL E. coli. -Patient with poor nutritional status, currently n.p.o., likely needs feeding tube in the future patient cannot meet calorie intake treatment with recurrent episodes of aspiration  despite being placed on a dysphagia 1 diet. -Feeding tube ordered. -Patient seen in consultation by ID and IV vancomycin has been discontinued and recommending continuation of Merrem to treat for total of 7 days. -Supportive care..  2.   Failure to thrive/dysphagia/severe protein calorie malnutrition, POA -Patient noted to be cachectic, with some temporal wasting, failure to thrive. -Patient with esophageal dysmotility. -Patient noted to have had a swallow evaluation in March noting moderate dysmotility concern for inadequate nutrition as albumin noted to < 1.5. -Cortrack ordered and placed 08/07/2021. -Consulted with dietitian to start tube feeds. -Status post IV albumin every 6 hours x24 hours.  -Albumin levels < 1.5. -Status post course of fluconazole for Candida esophagitis during last hospitalization. -Palliative care consulted and following. -Due to ongoing dysphagia, esophageal dysmotility, failure to thrive, will consult with GI tomorrow for further evaluation and management to see whether patient may need an EGD and for further recommendations. -If ongoing aggressive care needed, and GI evaluation completed with no further recommendations may need to consider PEG tube placement for nutrition. -Palliative care following and appreciate input and recommendations  3.  Acute kidney injury -Likely secondary to prerenal azotemia in the setting of severe dehydration, volume contraction. -Patient noted to have been on half-normal saline which was changed to D5W. -Renal function improved with hydration. -Avoid nephrotoxins. -Saline lock IV fluids.  -Follow.  4.  Hypernatremia -Secondary to dehydration, failure to thrive, volume depletion.  -Improved initially on D5W, subsequently on D5 half-normal saline with resolution of hypernatremia with sodium at 140 this morning.  -Saline lock IV fluids.   -Follow.  5.  UTI with hematuria versus bacteriuria -Is noted to be turbid, moderate leukocytes, nitrite negative, 21-50 WBCs, WBC clumps noted, many bacteria. -Urine cultures sent 08/05/2021-seems as if they were canceled.  -Continue IV Merrem.    6.  Hyperkalemia>>>> hypokalemia/hypophosphatemia -Likely secondary to acute  kidney injury. -Hyperkalemia improved currently resolved patient noted to be hypokalemic and potassium repleted currently at 4.5.   -Phosphorus level at 1.5 from 2.0 from 1.2.   -Status post sodium phosphate 30 mmol IV x1. -Discontinue oral phosphorus supplementation. -Sodium phosphate 30 mmol IV x1. -Repeat labs in the AM.  7.  Acute metabolic encephalopathy -Likely secondary to sepsis/acute infection. -Clinical improvement.   -Continue IV antibiotics, tube feeds.   -Follow.  8.  Hypertension -Patient currently n.p.o. with esophageal dysmotility. -Continue home regimen Lopressor via tube for BP control.   9.  Chronic systolic CHF -Patient dehydrated, volume contracted and also with hypoxia from pneumonia. -IV fluids have been discontinued.   -No signs of acute decompensation.   -Follow.    10.  Psoriasis arthritis -No clear etiology of esophageal dysmotility, unsure whether related to psoriatic arthritis which is likely poorly controlled. -We will need outpatient follow-up with rheumatology.  11.  Thrombocytopenia -In the setting of sepsis. -Platelet count fluctuating.   12.  GERD -Continue PPI.    13.  Anemia./Folate deficiency -Patient with no overt bleeding. -Likely dilutional component. -Anemia panel with iron level of 19, folate of 4.0, ferritin at 1850, TIBC not calculated.  Vitamin B12 levels of 421.  -H&H stable at 10.  -Continue folic acid 1 mg daily.  -Transfusion threshold hemoglobin < 7.  14.  Pseudohypocalcemia -Corrected calcium at 9.30.  15.  Failure to thrive/malnutrition/severe protein calorie malnutrition -Patient noted with esophageal dysmotility and nausea and decreased oral intake.  Patient required cortrack for nutrition during this hospitalization. -Albumin levels < 1.5. -Continue IV albumin twice  daily x3 days. -May need PEG tube placement however will defer until discussions are completed. -We will consult with GI tomorrow for further  evaluation and recommendations for patient's dysphagia. -Palliative care consulted and following.   14.  Pressure injury, POA Pressure Injury 08/05/21 Buttocks Right Stage 2 -  Partial thickness loss of dermis presenting as a shallow open injury with a red, pink wound bed without slough. red, no drainage (Active)  08/05/21 1835  Location: Buttocks  Location Orientation: Right  Staging: Stage 2 -  Partial thickness loss of dermis presenting as a shallow open injury with a red, pink wound bed without slough.  Wound Description (Comments): red, no drainage  Present on Admission: Yes     Pressure Injury 08/05/21 Hip Right Stage 2 -  Partial thickness loss of dermis presenting as a shallow open injury with a red, pink wound bed without slough. (Active)  08/05/21 1837  Location: Hip  Location Orientation: Right  Staging: Stage 2 -  Partial thickness loss of dermis presenting as a shallow open injury with a red, pink wound bed without slough.  Wound Description (Comments):   Present on Admission: Yes         DVT prophylaxis: Heparin Code Status: Full Family Communication: Updated patient.  Updated sons at bedside.  Disposition: Likely back to SNF.  Status is: Inpatient Remains inpatient appropriate because: Severity of illness.   Consultants:  ID: Dr. Juleen China 08/07/2021 [place] Palliative care: Dr. Rowe Pavy 08/08/2021  Procedures:  CT angiogram chest 08/05/2021 Chest x-ray 08/05/2021, 08/06/2021 Abdominal films 08/07/2021 Cortrack placement 08/07/2021   Antimicrobials:  IV cefepime 08/05/2021>>>> 08/06/2021 IV Merrem 08/06/2021>>>> 08/13/2021 IV vancomycin 08/05/2021>>>> 08/07/2021   Subjective: Laying in bed.  Family at bedside.  No chest pain.  No shortness of breath.  No abdominal pain.  Overall states she is feeling a little bit better.   Objective: Vitals:   08/12/21 0010 08/12/21 0425 08/12/21 0522 08/12/21 0819  BP: 119/79 126/71  122/73  Pulse: (!) 102 94  96  Resp:  (!) 25     Temp: 98.1 F (36.7 C) 98.5 F (36.9 C)  98.4 F (36.9 C)  TempSrc: Oral Oral  Oral  SpO2: 93% 94%  96%  Weight:   69.8 kg     Intake/Output Summary (Last 24 hours) at 08/12/2021 1222 Last data filed at 08/12/2021 0300 Gross per 24 hour  Intake 1766.11 ml  Output --  Net 1766.11 ml    Filed Weights   08/11/21 0500 08/11/21 0758 08/12/21 0522  Weight: 72 kg 72.2 kg 69.8 kg    Examination:  General exam: Frail.  Cachectic.  Some temporal wasting. Cortrack in place. Respiratory system: Clear to auscultation anterior lung fields.  No wheezes, no crackles, no rhonchi.  Fair air movement.  Cardiovascular system: Regular rate rhythm no murmurs rubs or gallops.  No JVD.  No lower extremity edema. Gastrointestinal system: Abdomen is soft, nontender, nondistended, positive bowel sounds.  No rebound.  No guarding.   Central nervous system: Alert and oriented x3. No focal neurological deficits. Extremities: Symmetric 5 x 5 power. Skin: No rashes, lesions or ulcers Psychiatry: Judgement and insight appear normal. Mood & affect appropriate.     Data Reviewed: I have personally reviewed following labs and imaging studies  CBC: Recent Labs  Lab 08/06/21 0011 08/07/21 0409 08/08/21 0416 08/09/21 0638 08/10/21 0236  WBC 15.8* 6.6 7.4 5.6 6.0  NEUTROABS  --  5.6  --   --   --  HGB 12.1 13.2 10.1* 11.6* 10.0*  HCT 38.2 43.3 31.9* 36.4 30.9*  MCV 96.5 97.3 96.1 94.5 93.1  PLT 140* 163 148* 124* 120*     Basic Metabolic Panel: Recent Labs  Lab 08/08/21 0416 08/08/21 1515 08/09/21 0638 08/10/21 0236 08/11/21 0252 08/12/21 0229  NA 141  --  139 134* 141 140  K 3.0*  --  4.0 4.1 4.3 4.5  CL 113*  --  112* 107 113* 111  CO2 25  --  $R'22 22 25 25  'cM$ GLUCOSE 150*  --  233* 168* 149* 125*  BUN 14  --  8 7* 6* 6*  CREATININE 0.71  --  0.51 0.44 0.46 0.42*  CALCIUM 8.1*  --  7.5* 6.9* 7.3* 7.9*  MG 1.9 1.8 1.7 2.0  --  2.0  PHOS 1.6* 2.6 1.3*  1.3* 1.2* 2.0* 1.5*      GFR: Estimated Creatinine Clearance: 68.3 mL/min (A) (by C-G formula based on SCr of 0.42 mg/dL (L)).  Liver Function Tests: Recent Labs  Lab 08/07/21 0409 08/08/21 0416 08/09/21 3300 08/10/21 0236 08/11/21 0252 08/12/21 0229  AST 50*  --   --   --   --   --   ALT 17  --   --   --   --   --   ALKPHOS 169*  --   --   --   --   --   BILITOT 0.3  --   --   --   --   --   PROT 6.6  --   --   --   --   --   ALBUMIN <1.5* 2.1* 1.7* <1.5* <1.5* 2.1*     CBG: Recent Labs  Lab 08/11/21 2023 08/12/21 0008 08/12/21 0456 08/12/21 0750 08/12/21 1131  GLUCAP 110* 123* 128* 118* 129*      Recent Results (from the past 240 hour(s))  Resp Panel by RT-PCR (Flu A&B, Covid) Nasopharyngeal Swab     Status: None   Collection Time: 08/05/21 10:46 AM   Specimen: Nasopharyngeal Swab; Nasopharyngeal(NP) swabs in vial transport medium  Result Value Ref Range Status   SARS Coronavirus 2 by RT PCR NEGATIVE NEGATIVE Final    Comment: (NOTE) SARS-CoV-2 target nucleic acids are NOT DETECTED.  The SARS-CoV-2 RNA is generally detectable in upper respiratory specimens during the acute phase of infection. The lowest concentration of SARS-CoV-2 viral copies this assay can detect is 138 copies/mL. A negative result does not preclude SARS-Cov-2 infection and should not be used as the sole basis for treatment or other patient management decisions. A negative result may occur with  improper specimen collection/handling, submission of specimen other than nasopharyngeal swab, presence of viral mutation(s) within the areas targeted by this assay, and inadequate number of viral copies(<138 copies/mL). A negative result must be combined with clinical observations, patient history, and epidemiological information. The expected result is Negative.  Fact Sheet for Patients:  EntrepreneurPulse.com.au  Fact Sheet for Healthcare Providers:   IncredibleEmployment.be  This test is no t yet approved or cleared by the [place] FDA and  has been authorized for detection and/or diagnosis of SARS-CoV-2 by FDA under an Emergency Use Authorization (EUA). This EUA will remain  in effect (meaning this test can be used) for the duration of the COVID-19 declaration under Section 564(b)(1) of the Act, 21 U.S.C.section 360bbb-3(b)(1), unless the authorization is terminated  or revoked sooner.       Influenza A by PCR NEGATIVE NEGATIVE  Final   Influenza B by PCR NEGATIVE NEGATIVE Final    Comment: (NOTE) The Xpert Xpress SARS-CoV-2/FLU/RSV plus assay is intended as an aid in the diagnosis of influenza from Nasopharyngeal swab specimens and should not be used as a sole basis for treatment. Nasal washings and aspirates are unacceptable for Xpert Xpress SARS-CoV-2/FLU/RSV testing.  Fact Sheet for Patients: EntrepreneurPulse.com.au  Fact Sheet for Healthcare Providers: IncredibleEmployment.be  This test is not yet approved or cleared by the [place] FDA and has been authorized for detection and/or diagnosis of SARS-CoV-2 by FDA under an Emergency Use Authorization (EUA). This EUA will remain in effect (meaning this test can be used) for the duration of the COVID-19 declaration under Section 564(b)(1) of the Act, 21 U.S.C. section 360bbb-3(b)(1), unless the authorization is terminated or revoked.  Performed at [facility], [place] [address] [place], [place] [address]   Blood Culture (routine x 2)     Status: Abnormal   Collection Time: 08/05/21 10:46 AM   Specimen: BLOOD  Result Value Ref Range Status   Specimen Description BLOOD BLOOD RIGHT WRIST  Final   Special Requests   Final    BOTTLES DRAWN AEROBIC AND ANAEROBIC Blood Culture adequate volume   Culture  Setup Time   Final    GRAM NEGATIVE RODS IN BOTH AEROBIC AND ANAEROBIC BOTTLES CRITICAL VALUE NOTED.   VALUE IS CONSISTENT WITH PREVIOUSLY REPORTED AND CALLED VALUE.    Culture (A)  Final    ESCHERICHIA COLI SUSCEPTIBILITIES PERFORMED ON PREVIOUS CULTURE WITHIN THE LAST 5 DAYS. Performed at [facility], [place] [address] [place], [place] [address]    Report Status 08/08/2021 FINAL  Final  Blood Culture (routine x 2)     Status: Abnormal   Collection Time: 08/05/21 10:51 AM   Specimen: BLOOD  Result Value Ref Range Status   Specimen Description BLOOD BLOOD LEFT WRIST  Final   Special Requests   Final    BOTTLES DRAWN AEROBIC AND ANAEROBIC Blood Culture adequate volume   Culture  Setup Time   Final    GRAM NEGATIVE RODS ANAEROBIC BOTTLE ONLY CRITICAL RESULT CALLED TO, READ BACK BY AND VERIFIED WITH: PHARMD ADRIENNE WELLBORN 08/06/21@1 :41 BY TW IN BOTH AEROBIC AND ANAEROBIC BOTTLES Performed at [facility], [place] [address] [place], [place] [address]    Culture (A)  Final    ESCHERICHIA COLI Confirmed Extended Spectrum Beta-Lactamase Producer (ESBL).  In bloodstream infections from ESBL organisms, carbapenems are preferred over piperacillin/tazobactam. They are shown to have a lower risk of mortality.    Report Status 08/08/2021 FINAL  Final   Organism ID, Bacteria ESCHERICHIA COLI  Final      Susceptibility   Escherichia coli - MIC*    AMPICILLIN >=32 RESISTANT Resistant     CEFAZOLIN >=64 RESISTANT Resistant     CEFEPIME >=32 RESISTANT Resistant     CEFTAZIDIME >=64 RESISTANT Resistant     CEFTRIAXONE >=64 RESISTANT Resistant     CIPROFLOXACIN >=4 RESISTANT Resistant     GENTAMICIN <=1 SENSITIVE Sensitive     IMIPENEM <=0.25 SENSITIVE Sensitive     TRIMETH/SULFA >=320 RESISTANT Resistant     AMPICILLIN/SULBACTAM >=32 RESISTANT Resistant     PIP/TAZO 16 SENSITIVE Sensitive     * ESCHERICHIA COLI  Blood Culture ID Panel (Reflexed)     Status: Abnormal   Collection Time: 08/05/21 10:51 AM  Result Value Ref Range Status   Enterococcus faecalis NOT DETECTED NOT  DETECTED Final  Enterococcus Faecium NOT DETECTED NOT DETECTED Final   Listeria monocytogenes NOT DETECTED NOT DETECTED Final   Staphylococcus species NOT DETECTED NOT DETECTED Final   Staphylococcus aureus (BCID) NOT DETECTED NOT DETECTED Final   Staphylococcus epidermidis NOT DETECTED NOT DETECTED Final   Staphylococcus lugdunensis NOT DETECTED NOT DETECTED Final   Streptococcus species NOT DETECTED NOT DETECTED Final   Streptococcus agalactiae NOT DETECTED NOT DETECTED Final   Streptococcus pneumoniae NOT DETECTED NOT DETECTED Final   Streptococcus pyogenes NOT DETECTED NOT DETECTED Final   A.calcoaceticus-baumannii NOT DETECTED NOT DETECTED Final   Bacteroides fragilis NOT DETECTED NOT DETECTED Final   Enterobacterales DETECTED (A) NOT DETECTED Final    Comment: Enterobacterales represent a large order of gram negative bacteria, not a single organism. CRITICAL RESULT CALLED TO, READ BACK BY AND VERIFIED WITH: PHARMD ADRIENNE WELLBORN 08/06/21@1 :41 BY TW    Enterobacter cloacae complex NOT DETECTED NOT DETECTED Final   Escherichia coli DETECTED (A) NOT DETECTED Final    Comment: CRITICAL RESULT CALLED TO, READ BACK BY AND VERIFIED WITH: PHARMD ADRIENNE WELLBORN 08/06/21@1 :41 BY TW    Klebsiella aerogenes NOT DETECTED NOT DETECTED Final   Klebsiella oxytoca NOT DETECTED NOT DETECTED Final   Klebsiella pneumoniae NOT DETECTED NOT DETECTED Final   Proteus species NOT DETECTED NOT DETECTED Final   Salmonella species NOT DETECTED NOT DETECTED Final   Serratia marcescens NOT DETECTED NOT DETECTED Final   Haemophilus influenzae NOT DETECTED NOT DETECTED Final   Neisseria meningitidis NOT DETECTED NOT DETECTED Final   Pseudomonas aeruginosa NOT DETECTED NOT DETECTED Final   Stenotrophomonas maltophilia NOT DETECTED NOT DETECTED Final   Candida albicans NOT DETECTED NOT DETECTED Final   Candida auris NOT DETECTED NOT DETECTED Final   Candida glabrata NOT DETECTED NOT DETECTED Final    Candida krusei NOT DETECTED NOT DETECTED Final   Candida parapsilosis NOT DETECTED NOT DETECTED Final   Candida tropicalis NOT DETECTED NOT DETECTED Final   Cryptococcus neoformans/gattii NOT DETECTED NOT DETECTED Final   CTX-M ESBL DETECTED (A) NOT DETECTED Final    Comment: CRITICAL RESULT CALLED TO, READ BACK BY AND VERIFIED WITH: PHARMD ADRIENNE WELLBORN 08/06/21@1 :41 BY TW (NOTE) Extended spectrum beta-lactamase detected. Recommend a carbapenem as initial therapy.      Carbapenem resistance IMP NOT DETECTED NOT DETECTED Final   Carbapenem resistance KPC NOT DETECTED NOT DETECTED Final   Carbapenem resistance NDM NOT DETECTED NOT DETECTED Final   Carbapenem resist OXA 48 LIKE NOT DETECTED NOT DETECTED Final   Carbapenem resistance VIM NOT DETECTED NOT DETECTED Final    Comment: Performed at [facility], [place] [address], [place], [place] [address]  MRSA Next Gen by PCR, Nasal     Status: Abnormal   Collection Time: 08/06/21  3:00 AM   Specimen: Nasal Mucosa; Nasal Swab  Result Value Ref Range Status   MRSA by PCR Next Gen DETECTED (A) NOT DETECTED Final    Comment: RESULT CALLED TO, READ BACK BY AND VERIFIED WITH: RN Y. RIANDO 08/06/21@4 :41 BY TW (NOTE) The GeneXpert MRSA Assay (FDA approved for NASAL specimens only), is one component of a comprehensive MRSA colonization surveillance program. It is not intended to diagnose MRSA infection nor to guide or monitor treatment for MRSA infections. Test performance is not FDA approved in patients less than 54 years old. Performed at [facility], [place] [address] [place], [place] [address]           Radiology Studies: No results found.  Scheduled Meds:  aspirin  81 mg Per Tube Daily   atorvastatin  80 mg Per Tube Daily   escitalopram  10 mg Per Tube QHS   feeding supplement (OSMOLITE 1.5 CAL)  1,000 mL Per Tube Q24H   feeding supplement (PROSource TF)  45 mL Per Tube Daily   folic acid  1 mg Per Tube  Daily   free water  175 mL Per Tube Q4H   gabapentin  100 mg Per Tube Q12H   heparin  5,000 Units Subcutaneous Q8H   liver oil-zinc oxide   Topical QID   metoprolol tartrate  25 mg Per Tube BID   pantoprazole sodium  40 mg Per Tube Daily   potassium & sodium phosphates  2 packet Per Tube TID   Continuous Infusions:  albumin human 25 g (08/12/21 1005)   meropenem (MERREM) IV 1 g (08/12/21 0451)   sodium phosphate  Dextrose 5% IVPB 30 mmol (08/12/21 1100)     LOS: 7 days    Time spent: 35 minutes    Irine Seal, MD [facility]   To contact the attending provider between 7A-7P or the covering provider during after hours 7P-7A, please log into the web site [email or web address] and access using universal [place] password for that web site. If you do not have the password, please call the hospital operator.  08/12/2021, 12:22 PM

## 2021-08-12 NOTE — Progress Notes (Signed)
Speech Language Pathology Treatment: Dysphagia  Patient Details Name: [name] [name] MRN: [id number] DOB: 03-Aug-1960 Today's Date: 08/12/2021 Time: 8144-8185 SLP Time Calculation (min) (ACUTE ONLY): 59 min  Assessment / Plan / Recommendation Clinical Impression  Pt is alert this afternoon although with a little fluctuating mentation throughout the session, including intermittent awareness of boluses in her mouth. She does tell me though that she has been having trouble chewing most consistencies, but when the food is pureed, not only does she still have some trouble getting it down, but that it is not palatable to her so she doesn't want much of it. Her family member, [name] [name] (says they are like sisters, but is technically a cousin), says that PTA she had been encouraging her to try to eat even soft things like potatoes or else she will need a feeding tube. Sounds like her intake has been quite low despite encouragement and modifications to diet.   Pt does not seem to be struggling as much with her secretions as she had been last week, with her vocal quality a little clearer and less baseline coughing. She also does not have secretions pooling in her oral cavity. Although no overt s/s of aspiration are observed during PO trials, shortly after completing them, she starts to have more frequent congested coughing. Pt (and [name] [name]) is interested in pursuing testing to evaluate for any possible pharyngeal dysphagia that could be contributing to current presentation. Reinforced to them both that this test is diagnostic and that it does not "fix" her swallowing. In fact, I told them that even if the test shows adequate function, I remain concerned about her underlying esophageal issues as well as the recurrent hospitalizations and inadequate nutrition. Would stick with ice chips only (after oral care) pending completion of MBS, which will be done as can be scheduled (which will be next date at the earliest).      HPI HPI: Patient is a 61 y.o. female who presented to ED from SNF with AMS, hypoxia, fever. Dx sepsis. PMH: FTT, esophageal dysmotility (esophagram March 2023), DM, RA with chronic pain, HTN, HL, CAD s/p CABG. Evaluated by SLP during Feb 2023 admission; primary issue was mentation. She was D/Cd on a dysphagia 3 diet with thin liquids with no concerns for aspiration.  Subsequent admission two weeks ago for pna; pt d/cd back to SNF on pureed diet.  Pt has had choking episodes while eating per chart review.      SLP Plan  MBS      Recommendations for follow up therapy are one component of a multi-disciplinary discharge planning process, led by the attending physician.  Recommendations may be updated based on patient status, additional functional criteria and insurance authorization.    Recommendations  Diet recommendations: NPO (few pieces of ice after oral care) Medication Administration: Via alternative means                Oral Care Recommendations: Oral care prior to ice chip/H20;Oral care QID Follow Up Recommendations: Skilled nursing-short term rehab (<3 hours/day) Assistance recommended at discharge: Frequent or constant Supervision/Assistance SLP Visit Diagnosis: Dysphagia, unspecified (R13.10);Dysphagia, oral phase (R13.11) Plan: MBS           [name] [name]., M.A. [place] [facility] [phone]  Secure chat preferred   08/12/2021, 4:13 PM

## 2021-08-13 ENCOUNTER — Inpatient Hospital Stay (HOSPITAL_COMMUNITY): Payer: Medicare HMO

## 2021-08-13 ENCOUNTER — Telehealth: Payer: Self-pay | Admitting: Cardiology

## 2021-08-13 DIAGNOSIS — R627 Adult failure to thrive: Secondary | ICD-10-CM | POA: Diagnosis not present

## 2021-08-13 DIAGNOSIS — R4182 Altered mental status, unspecified: Secondary | ICD-10-CM | POA: Diagnosis not present

## 2021-08-13 DIAGNOSIS — N179 Acute kidney failure, unspecified: Secondary | ICD-10-CM | POA: Diagnosis not present

## 2021-08-13 DIAGNOSIS — R1310 Dysphagia, unspecified: Secondary | ICD-10-CM | POA: Diagnosis not present

## 2021-08-13 DIAGNOSIS — I5022 Chronic systolic (congestive) heart failure: Secondary | ICD-10-CM | POA: Diagnosis not present

## 2021-08-13 DIAGNOSIS — A419 Sepsis, unspecified organism: Secondary | ICD-10-CM | POA: Diagnosis not present

## 2021-08-13 LAB — RENAL FUNCTION PANEL
Albumin: 2.4 g/dL — ABNORMAL LOW (ref 3.5–5.0)
Anion gap: 6 (ref 5–15)
BUN: 6 mg/dL — ABNORMAL LOW (ref 8–23)
CO2: 28 mmol/L (ref 22–32)
Calcium: 8.4 mg/dL — ABNORMAL LOW (ref 8.9–10.3)
Chloride: 107 mmol/L (ref 98–111)
Creatinine, Ser: 0.42 mg/dL — ABNORMAL LOW (ref 0.44–1.00)
GFR, Estimated: >60 mL/min (ref >60)
Glucose, Bld: 134 mg/dL — ABNORMAL HIGH (ref 70–99)
Phosphorus: 3.5 mg/dL (ref 2.5–4.6)
Potassium: 4.3 mmol/L (ref 3.5–5.1)
Sodium: 141 mmol/L (ref 135–145)

## 2021-08-13 LAB — MAGNESIUM: Magnesium: 1.8 mg/dL (ref 1.7–2.4)

## 2021-08-13 LAB — GLUCOSE, CAPILLARY
Glucose-Capillary: 129 mg/dL — ABNORMAL HIGH (ref 70–99)
Glucose-Capillary: 116 mg/dL — ABNORMAL HIGH (ref 70–99)
Glucose-Capillary: 143 mg/dL — ABNORMAL HIGH (ref 70–99)
Glucose-Capillary: 95 mg/dL (ref 70–99)
Glucose-Capillary: 110 mg/dL — ABNORMAL HIGH (ref 70–99)
Glucose-Capillary: 155 mg/dL — ABNORMAL HIGH (ref 70–99)

## 2021-08-13 NOTE — Progress Notes (Addendum)
PROGRESS NOTE    [name] [name]  STM:[id number] DOB: April 28, 1960 DOA: 08/05/2021 PCP: Leeroy Cha, MD    Chief Complaint  Patient presents with   Altered Mental Status    Brief Narrative:  HPI per Dr. Wynetta Fines on 08/05/21 [name] [name] is a 61 y.o. female with medical history significant of failure to thrive, dysphagia secondary to moderate esophageal dysmotility (barium swallow March 2023), psoriasis arthritis, CAD status post CABG, DM-2, chronic systolic CHF LVEF 89%, was sent to hospital for evaluation of altered mentations hypoxia and fever.   Patient was recently hospitalized and discharged to nursing home 11 days ago after treatment of supposed empyema and pneumonia.  Patient has established history of failure to thrive and has been losing weight, and according to the record she lost about 13 kg in last 37-month.    Patient unable to provide any history as she is confused and agitated.  All history provided by patient and [name]([phone], and patient was discharged on soft diet, last week, her diet was downgraded to pured.  Despite, family observed several times patient had choking episode after eating for food.  This morning, family was contacted about patient had after mentations and hypoxia   ED Course: Patient was found hypoxic initially briefly on 15 L of nonrebreather and titrated down to 5 L, tachycardia hypotensive but responded to IV fluid.  Febrile 101.4   Angiogram negative for PE but patchy infiltrates on bilateral lower lung fields suspicious for pneumonia.  Small right-sided pleural effusion.   Patient was started on vancomycin and cefepime.  Sodium 149, K5.2, creatinine 1.5, WBC 12.2.   **Interim History Patient feels short of breath but oxygen requirement has been weaned from 5 L to 3 L.  Still having difficulty swallowing and managing secretions so she is made n.p.o.  Discussed with daughter about a small bore feeding tube and she may be agreeable  for tomorrow.  She is found to have an ESBL E. coli bacteremia and is now on IV meropenem     Assessment & Plan:   Principal Problem:   Sepsis ([facility] Active Problems:   Rheumatoid arthritis ([facility]   Gastroesophageal reflux disease without esophagitis   AMS (altered mental status)   Hypokalemia   Hypernatremia   Pressure injury of skin   Malnutrition of moderate degree   AKI (acute kidney injury) ([facility]   Adult failure to thrive syndrome   Dysphagia   Chronic systolic CHF (congestive heart failure) (HCC)   Infection due to ESBL-producing Escherichia coli   Hypophosphatemia   Folate deficiency  #1 severe sepsis secondary to ESBL E. coli bacteremia, aspiration pneumonia, possible UTI/acute respiratory failure with hypoxia/lactic acidosis. -Patient noted to have met criteria for sepsis with tachycardia, new onset hypoxia, fever, suspected source noted to be pneumonia, bacteremia UTI.  Patient noted with signs of endorgan damage of AKI and acute encephalopathy. -Improving clinically. -Patient with history of aspiration and radiologic pattern of pneumonia with concern for aspiration pneumonia. -MRSA PCR positive. -Patient was on IV vancomycin, IV cefepime and IV cefepime changed to IV Merrem due to ESBL E. coli. -Patient seen by speech therapy and made n.p.o. due to increased risk of aspiration and difficulty with clearing secretions. -Leukocytosis trending down. -Procalcitonin noted to be elevated but not repeated this morning. -Blood cultures done positive for ESBL E. coli. -Patient with poor nutritional status, currently n.p.o., likely needs feeding tube in the future patient cannot meet calorie intake treatment with recurrent episodes of aspiration  despite being placed on a dysphagia 1 diet. -Feeding tube ordered. -Patient seen in consultation by ID and IV vancomycin has been discontinued and recommending continuation of Merrem to treat for total of 7 days. -Supportive care..  2.   Failure to thrive/dysphagia/severe protein calorie malnutrition, POA -Patient noted to be cachectic, with some temporal wasting, failure to thrive. -Patient with esophageal dysmotility. -Patient noted to have had a swallow evaluation in March noting moderate dysmotility concern for inadequate nutrition as albumin noted to < 1.5. -Cortrack ordered and placed 08/07/2021. -Consulted with dietitian to start tube feeds. -Status post IV albumin every 6 hours x24 hours.  -Albumin levels < 1.5 on presentation and currently at 2.4 after receiving IV albumin. -Status post course of fluconazole for Candida esophagitis during last hospitalization. -Palliative care consulted and following. -Due to ongoing dysphagia, esophageal dysmotility, failure to thrive, GI was formally consulted who assessed the chart, reviewed prior procedures and feel patient's dysphagia is multifactorial and does not feel repeating an EGD would change management.  Per GI patient denies any odynophagia.  GI recommended slowly advance diet per speech and discussed case in detail with patient's son.  Per GI recommend IR consult for PEG tube placement if family decided for PEG tube placement for nutrition.   -Patient also being followed by speech therapy and underwent MBS and patient at increased risk for aspiration.  Speech recommended dysphagia 1 diet with nectar thick liquids..  -Palliative care following and appreciate input and recommendations  3.  Acute kidney injury -Likely secondary to prerenal azotemia in the setting of severe dehydration, volume contraction. -Patient noted to have been on half-normal saline which was changed to D5W. -Renal function improved with hydration. -Avoid nephrotoxins. -Saline lock IV fluids.  -Follow.  4.  Hypernatremia -Secondary to dehydration, failure to thrive, volume depletion.  -Improved initially on D5W, subsequently on D5 half-normal saline with resolution of hypernatremia with sodium at 141  this morning.  -Saline lock IV fluids.   -Follow.  5.  UTI with hematuria versus bacteriuria -Is noted to be turbid, moderate leukocytes, nitrite negative, 21-50 WBCs, WBC clumps noted, many bacteria. -Urine cultures sent 08/05/2021-seems as if they were canceled.  -Continue IV Merrem.    6.  Hyperkalemia>>>> hypokalemia/hypophosphatemia -Likely secondary to acute kidney injury. -Hyperkalemia improved currently resolved patient noted to be hypokalemic and potassium repleted currently at 4.3.   -Phosphorus level at 1.5 from 2.0 from 1.2.   -Status post sodium phosphate 30 mmol IV x1. -Discontinued oral phosphorus supplementation. -Repeat labs in the AM.  7.  Acute metabolic encephalopathy -Likely secondary to sepsis/acute infection. -Clinical improvement.   -Continue IV antibiotics, tube feeds.   -Follow.  8.  Hypertension -Patient currently n.p.o. with esophageal dysmotility. -Continue home regimen Lopressor via tube for BP control.   9.  Chronic systolic CHF -Patient dehydrated, volume contracted and also with hypoxia from pneumonia. -IV fluids have been discontinued.   -No signs of acute decompensation.   -Follow.    10.  Psoriasis arthritis -No clear etiology of esophageal dysmotility, unsure whether related to psoriatic arthritis which is likely poorly controlled. -We will need outpatient follow-up with rheumatology.  11.  Thrombocytopenia -In the setting of sepsis. -Platelet count fluctuating.  -Patient with no overt bleeding.  12.  GERD -PPI.    13.  Anemia./Folate deficiency -Patient with no overt bleeding. -Likely dilutional component. -Anemia panel with iron level of 19, folate of 4.0, ferritin at 1850, TIBC not calculated.  Vitamin B12 levels of 421.  -  H&H stable at 10.  -Continue folic acid 1 mg daily.  -Transfusion threshold hemoglobin < 7.  14.  Pseudohypocalcemia -Corrected calcium at 9.30.  15.  Failure to thrive/malnutrition/severe protein calorie  malnutrition -Patient noted with esophageal dysmotility and nausea and decreased oral intake.  Patient required cortrack for nutrition during this hospitalization. -Albumin levels < 1.5. -Status post IV albumin x3 days.  -May need PEG tube placement if family and patient decides to treat aggressively, however will defer until discussions are completed. -Patient seen in consultation by GI who have reviewed the chart, I feel patient dysphagia is multifactorial and do not feel repeating EGD would change any management and recommend slowly advancing diet per speech recommendations.  GI called and spoke with son in detail. -Palliative care consulted and following.   14.  Pressure injury, POA Pressure Injury 08/05/21 Buttocks Right Stage 2 -  Partial thickness loss of dermis presenting as a shallow open injury with a red, pink wound bed without slough. red, no drainage (Active)  08/05/21 1835  Location: Buttocks  Location Orientation: Right  Staging: Stage 2 -  Partial thickness loss of dermis presenting as a shallow open injury with a red, pink wound bed without slough.  Wound Description (Comments): red, no drainage  Present on Admission: Yes     Pressure Injury 08/05/21 Hip Right Stage 2 -  Partial thickness loss of dermis presenting as a shallow open injury with a red, pink wound bed without slough. (Active)  08/05/21 1837  Location: Hip  Location Orientation: Right  Staging: Stage 2 -  Partial thickness loss of dermis presenting as a shallow open injury with a red, pink wound bed without slough.  Wound Description (Comments):   Present on Admission: Yes         DVT prophylaxis: Heparin Code Status: Full Family Communication: Updated patient.  No family at bedside.  Disposition: Likely back to SNF.  Status is: Inpatient Remains inpatient appropriate because: Severity of illness.   Consultants:  ID: Dr. Juleen China 08/07/2021 [place] Palliative care: Dr. Rowe Pavy  08/08/2021 Gastroenterology: Dr. Alessandra Bevels 08/13/2021  Procedures:  CT angiogram chest 08/05/2021 Chest x-ray 08/05/2021, 08/06/2021 Abdominal films 08/07/2021 Cortrack placement 08/07/2021 Modified barium swallow 08/13/2021   Antimicrobials:  IV cefepime 08/05/2021>>>> 08/06/2021 IV Merrem 08/06/2021>>>> 08/13/2021 IV vancomycin 08/05/2021>>>> 08/07/2021   Subjective: Patient laying in bed.  Been assessed by therapy.  Denies any chest pain.  Patient with some intermittent coughing.  No shortness of breath.  No abdominal pain.  Just returned [facility].   Objective: Vitals:   08/12/21 2322 08/13/21 0328 08/13/21 0757 08/13/21 1214  BP: 125/78 (!) 159/83 (!) 148/85 126/72  Pulse: 84 87 86   Resp: 16 (!) 24    Temp: 98.1 F (36.7 C) 98.2 F (36.8 C) 97.7 F (36.5 C)   TempSrc:   Oral   SpO2: 100% 96% 99%   Weight:        Intake/Output Summary (Last 24 hours) at 08/13/2021 1242 Last data filed at 08/12/2021 1800 Gross per 24 hour  Intake 550 ml  Output --  Net 550 ml    Filed Weights   08/11/21 0500 08/11/21 0758 08/12/21 0522  Weight: 72 kg 72.2 kg 69.8 kg    Examination:  General exam: Frail.  Cachectic.  Some temporal wasting. Cortrack in place. Respiratory system: Some scattered coarse breath sounds.  No wheezing.  Fair air movement.  No crackles.   Cardiovascular system: RRR no murmurs rubs or gallops.  No JVD.  No lower extremity edema.  Gastrointestinal system: Abdomen is soft, nontender, nondistended, positive bowel sounds.  No rebound.  No guarding.   Central nervous system: Alert and oriented x3. No focal neurological deficits. Extremities: Symmetric 5 x 5 power. Skin: No rashes, lesions or ulcers Psychiatry: Judgement and insight appear normal. Mood & affect appropriate.     Data Reviewed: I have personally reviewed following labs and imaging studies  CBC: Recent Labs  Lab 08/07/21 0409 08/08/21 0416 08/09/21 0638 08/10/21 0236  WBC 6.6 7.4 5.6 6.0   NEUTROABS 5.6  --   --   --   HGB 13.2 10.1* 11.6* 10.0*  HCT 43.3 31.9* 36.4 30.9*  MCV 97.3 96.1 94.5 93.1  PLT 163 148* 124* 120*     Basic Metabolic Panel: Recent Labs  Lab 08/08/21 1515 08/09/21 0638 08/10/21 0236 08/11/21 0252 08/12/21 0229 08/13/21 0839  NA  --  139 134* 141 140 141  K  --  4.0 4.1 4.3 4.5 4.3  CL  --  112* 107 113* 111 107  CO2  --  $R'22 22 25 25 28  'tx$ GLUCOSE  --  233* 168* 149* 125* 134*  BUN  --  8 7* 6* 6* 6*  CREATININE  --  0.51 0.44 0.46 0.42* 0.42*  CALCIUM  --  7.5* 6.9* 7.3* 7.9* 8.4*  MG 1.8 1.7 2.0  --  2.0 1.8  PHOS 2.6 1.3*  1.3* 1.2* 2.0* 1.5* 3.5     GFR: Estimated Creatinine Clearance: 68.3 mL/min (A) (by C-G formula based on SCr of 0.42 mg/dL (L)).  Liver Function Tests: Recent Labs  Lab 08/07/21 0409 08/08/21 0416 08/09/21 1062 08/10/21 0236 08/11/21 0252 08/12/21 0229 08/13/21 0839  AST 50*  --   --   --   --   --   --   ALT 17  --   --   --   --   --   --   ALKPHOS 169*  --   --   --   --   --   --   BILITOT 0.3  --   --   --   --   --   --   PROT 6.6  --   --   --   --   --   --   ALBUMIN <1.5*   < > 1.7* <1.5* <1.5* 2.1* 2.4*   < > = values in this interval not displayed.     CBG: Recent Labs  Lab 08/12/21 1951 08/12/21 2323 08/13/21 0329 08/13/21 0755 08/13/21 1213  GLUCAP 133* 107* 129* 116* 143*      Recent Results (from the past 240 hour(s))  Resp Panel by RT-PCR (Flu A&B, Covid) Nasopharyngeal Swab     Status: None   Collection Time: 08/05/21 10:46 AM   Specimen: Nasopharyngeal Swab; Nasopharyngeal(NP) swabs in vial transport medium  Result Value Ref Range Status   SARS Coronavirus 2 by RT PCR NEGATIVE NEGATIVE Final    Comment: (NOTE) SARS-CoV-2 target nucleic acids are NOT DETECTED.  The SARS-CoV-2 RNA is generally detectable in upper respiratory specimens during the acute phase of infection. The lowest concentration of SARS-CoV-2 viral copies this assay can detect is 138 copies/mL. A  negative result does not preclude SARS-Cov-2 infection and should not be used as the sole basis for treatment or other patient management decisions. A negative result may occur with  improper specimen collection/handling, submission of specimen other than nasopharyngeal swab, presence of viral mutation(s)  within the areas targeted by this assay, and inadequate number of viral copies(<138 copies/mL). A negative result must be combined with clinical observations, patient history, and epidemiological information. The expected result is Negative.  Fact Sheet for Patients:  EntrepreneurPulse.com.au  Fact Sheet for Healthcare Providers:  IncredibleEmployment.be  This test is no t yet approved or cleared by the [place] FDA and  has been authorized for detection and/or diagnosis of SARS-CoV-2 by FDA under an Emergency Use Authorization (EUA). This EUA will remain  in effect (meaning this test can be used) for the duration of the COVID-19 declaration under Section 564(b)(1) of the Act, 21 U.S.C.section 360bbb-3(b)(1), unless the authorization is terminated  or revoked sooner.       Influenza A by PCR NEGATIVE NEGATIVE Final   Influenza B by PCR NEGATIVE NEGATIVE Final    Comment: (NOTE) The Xpert Xpress SARS-CoV-2/FLU/RSV plus assay is intended as an aid in the diagnosis of influenza from Nasopharyngeal swab specimens and should not be used as a sole basis for treatment. Nasal washings and aspirates are unacceptable for Xpert Xpress SARS-CoV-2/FLU/RSV testing.  Fact Sheet for Patients: EntrepreneurPulse.com.au  Fact Sheet for Healthcare Providers: IncredibleEmployment.be  This test is not yet approved or cleared by the [place] FDA and has been authorized for detection and/or diagnosis of SARS-CoV-2 by FDA under an Emergency Use Authorization (EUA). This EUA will remain in effect (meaning this test can  be used) for the duration of the COVID-19 declaration under Section 564(b)(1) of the Act, 21 U.S.C. section 360bbb-3(b)(1), unless the authorization is terminated or revoked.  Performed at [facility], [place] [address], [place], [place] [address]   Blood Culture (routine x 2)     Status: Abnormal   Collection Time: 08/05/21 10:46 AM   Specimen: BLOOD  Result Value Ref Range Status   Specimen Description BLOOD BLOOD RIGHT WRIST  Final   Special Requests   Final    BOTTLES DRAWN AEROBIC AND ANAEROBIC Blood Culture adequate volume   Culture  Setup Time   Final    GRAM NEGATIVE RODS IN BOTH AEROBIC AND ANAEROBIC BOTTLES CRITICAL VALUE NOTED.  VALUE IS CONSISTENT WITH PREVIOUSLY REPORTED AND CALLED VALUE.    Culture (A)  Final    ESCHERICHIA COLI SUSCEPTIBILITIES PERFORMED ON PREVIOUS CULTURE WITHIN THE LAST 5 DAYS. Performed at [facility], [place] [address] [place], [place] [address]    Report Status 08/08/2021 FINAL  Final  Blood Culture (routine x 2)     Status: Abnormal   Collection Time: 08/05/21 10:51 AM   Specimen: BLOOD  Result Value Ref Range Status   Specimen Description BLOOD BLOOD LEFT WRIST  Final   Special Requests   Final    BOTTLES DRAWN AEROBIC AND ANAEROBIC Blood Culture adequate volume   Culture  Setup Time   Final    GRAM NEGATIVE RODS ANAEROBIC BOTTLE ONLY CRITICAL RESULT CALLED TO, READ BACK BY AND VERIFIED WITH: PHARMD ADRIENNE WELLBORN 08/06/21@1 :41 BY TW IN BOTH AEROBIC AND ANAEROBIC BOTTLES Performed at [facility], [place] [address], [place], [place] [address]    Culture (A)  Final    ESCHERICHIA COLI Confirmed Extended Spectrum Beta-Lactamase Producer (ESBL).  In bloodstream infections from ESBL organisms, carbapenems are preferred over piperacillin/tazobactam. They are shown to have a lower risk of mortality.    Report Status 08/08/2021 FINAL  Final   Organism ID, Bacteria ESCHERICHIA COLI  Final      Susceptibility   Escherichia  coli - MIC*    AMPICILLIN >=32 RESISTANT Resistant     CEFAZOLIN >=64 RESISTANT Resistant     CEFEPIME >=32 RESISTANT Resistant     CEFTAZIDIME >=64 RESISTANT Resistant     CEFTRIAXONE >=64 RESISTANT Resistant     CIPROFLOXACIN >=4 RESISTANT Resistant     GENTAMICIN <=1 SENSITIVE Sensitive     IMIPENEM <=0.25 SENSITIVE Sensitive     TRIMETH/SULFA >=320 RESISTANT Resistant     AMPICILLIN/SULBACTAM >=32 RESISTANT Resistant     PIP/TAZO 16 SENSITIVE Sensitive     * ESCHERICHIA COLI  Blood Culture ID Panel (Reflexed)     Status: Abnormal   Collection Time: 08/05/21 10:51 AM  Result Value Ref Range Status   Enterococcus faecalis NOT DETECTED NOT DETECTED Final   Enterococcus Faecium NOT DETECTED NOT DETECTED Final   Listeria monocytogenes NOT DETECTED NOT DETECTED Final   Staphylococcus species NOT DETECTED NOT DETECTED Final   Staphylococcus aureus (BCID) NOT DETECTED NOT DETECTED Final   Staphylococcus epidermidis NOT DETECTED NOT DETECTED Final   Staphylococcus lugdunensis NOT DETECTED NOT DETECTED Final   Streptococcus species NOT DETECTED NOT DETECTED Final   Streptococcus agalactiae NOT DETECTED NOT DETECTED Final   Streptococcus pneumoniae NOT DETECTED NOT DETECTED Final   Streptococcus pyogenes NOT DETECTED NOT DETECTED Final   A.calcoaceticus-baumannii NOT DETECTED NOT DETECTED Final   Bacteroides fragilis NOT DETECTED NOT DETECTED Final   Enterobacterales DETECTED (A) NOT DETECTED Final    Comment: Enterobacterales represent a large order of gram negative bacteria, not a single organism. CRITICAL RESULT CALLED TO, READ BACK BY AND VERIFIED WITH: PHARMD ADRIENNE WELLBORN 08/06/21@1 :41 BY TW    Enterobacter cloacae complex NOT DETECTED NOT DETECTED Final   Escherichia coli DETECTED (A) NOT DETECTED Final    Comment: CRITICAL RESULT CALLED TO, READ BACK BY AND VERIFIED WITH: PHARMD ADRIENNE WELLBORN 08/06/21@1 :41 BY TW    Klebsiella aerogenes NOT DETECTED NOT DETECTED Final    Klebsiella oxytoca NOT DETECTED NOT DETECTED Final   Klebsiella pneumoniae NOT DETECTED NOT DETECTED Final   Proteus species NOT DETECTED NOT DETECTED Final   Salmonella species NOT DETECTED NOT DETECTED Final   Serratia marcescens NOT DETECTED NOT DETECTED Final   Haemophilus influenzae NOT DETECTED NOT DETECTED Final   Neisseria meningitidis NOT DETECTED NOT DETECTED Final   Pseudomonas aeruginosa NOT DETECTED NOT DETECTED Final   Stenotrophomonas maltophilia NOT DETECTED NOT DETECTED Final   Candida albicans NOT DETECTED NOT DETECTED Final   Candida auris NOT DETECTED NOT DETECTED Final   Candida glabrata NOT DETECTED NOT DETECTED Final   Candida krusei NOT DETECTED NOT DETECTED Final   Candida parapsilosis NOT DETECTED NOT DETECTED Final   Candida tropicalis NOT DETECTED NOT DETECTED Final   Cryptococcus neoformans/gattii NOT DETECTED NOT DETECTED Final   CTX-M ESBL DETECTED (A) NOT DETECTED Final    Comment: CRITICAL RESULT CALLED TO, READ BACK BY AND VERIFIED WITH: PHARMD ADRIENNE WELLBORN 08/06/21@1 :41 BY TW (NOTE) Extended spectrum beta-lactamase detected. Recommend a carbapenem as initial therapy.      Carbapenem resistance IMP NOT DETECTED NOT DETECTED Final   Carbapenem resistance KPC NOT DETECTED NOT DETECTED Final   Carbapenem resistance NDM NOT DETECTED NOT DETECTED Final   Carbapenem resist OXA 48 LIKE NOT DETECTED NOT DETECTED Final   Carbapenem resistance VIM NOT DETECTED NOT DETECTED Final    Comment: Performed at [facility], [place] [address], [place], [place] [address]  MRSA Next Gen by PCR, Nasal     Status: Abnormal  Collection Time: 08/06/21  3:00 AM   Specimen: Nasal Mucosa; Nasal Swab  Result Value Ref Range Status   MRSA by PCR Next Gen DETECTED (A) NOT DETECTED Final    Comment: RESULT CALLED TO, READ BACK BY AND VERIFIED WITH: RN Heloise Ochoa 08/06/21@4 :41 BY TW (NOTE) The GeneXpert MRSA Assay (FDA approved for NASAL specimens only), is one  component of a comprehensive MRSA colonization surveillance program. It is not intended to diagnose MRSA infection nor to guide or monitor treatment for MRSA infections. Test performance is not FDA approved in patients less than 64 years old. Performed at [facility], [place] [address] [place], [place] [address]           Radiology Studies: No results found.      Scheduled Meds:  aspirin  81 mg Per Tube Daily   atorvastatin  80 mg Per Tube Daily   escitalopram  10 mg Per Tube QHS   feeding supplement (OSMOLITE 1.5 CAL)  1,000 mL Per Tube Q24H   feeding supplement (PROSource TF)  45 mL Per Tube Daily   folic acid  1 mg Per Tube Daily   free water  175 mL Per Tube Q4H   gabapentin  100 mg Per Tube Q12H   heparin  5,000 Units Subcutaneous Q8H   liver oil-zinc oxide   Topical QID   metoprolol tartrate  25 mg Per Tube BID   pantoprazole sodium  40 mg Per Tube Daily   Continuous Infusions:  albumin human 25 g (08/13/21 1109)   meropenem (MERREM) IV 1 g (08/13/21 0552)     LOS: 8 days    Time spent: 40 minutes    Irine Seal, MD [facility]   To contact the attending provider between 7A-7P or the covering provider during after hours 7P-7A, please log into the web site [email or web address] and access using universal [place] password for that web site. If you do not have the password, please call the hospital operator.  08/13/2021, 12:42 PM

## 2021-08-13 NOTE — Progress Notes (Signed)
Modified Barium Swallow Progress Note  Patient Details  Name: [name] [name] MRN: [id number] Date of Birth: October 22, 1960  Today's Date: 08/13/2021  Modified Barium Swallow completed.  Full report located under Chart Review in the Imaging Section.  Brief recommendations include the following:  Clinical Impression  Pt demonstrates a moderate pharyngeal and cervical esophageal dysphagia. Pt has decreased velopharyngeal closure, base of tongue retraction and epiglottic inversion with significant residue with all textures in the upper pharyngeal space with moderate nasopharyngeal reflux. The upper pharyngeal constrictor is activating, but there is very weak pressure and propulsion for upper pharyngeal peristalsis against a very tight cricopharyngeal segement given presence of obstruction from bony complex at C3-6 (this is likely the primary problem). Presence of Cortrak may exacerbate these problems. Residue is moderate to severe with thin, nectar and puree. Pt aspirates thin with delayed sesation and also has increasing secretion production, likely triggered by nasal regurgitation with pooling of secretions after aspiration. Nectar and puree were not aspirated, but pt required 6 swallows for one sip of nectar, followed by expectoration of secretions.   Expect increasing residue throughout a meal with eventual aspiration of most textures. Oral intake would be exhausting and likely expend more calories than calories gained. Pt extremely fatigued after just a few sips. Pts function has probably worsened to this extent over the past few weeks to month because of profound weakness and decreased nutrition. Pt could attempt purees and nectar thick liquids with high risk of aspiration and expected ongoing weight loss.   Swallow Evaluation Recommendations       SLP Diet Recommendations: Dysphagia 1 (Puree) solids;Nectar thick liquid   Liquid Administration via: Cup   Medication Administration: Via  alternative means       Compensations: Slow rate;Small sips/bites;Multiple dry swallows after each bite/sip   Postural Changes: Seated upright at 90 degrees;Remain semi-upright after after feeds/meals (Comment)   Oral Care Recommendations: Oral care BID   Other Recommendations: Have oral suction available    Kyllian Clingerman, [name] 08/13/2021,10:42 AM

## 2021-08-13 NOTE — Progress Notes (Signed)
Palliative Medicine Progress Note   Patient Name: [name] [name]       Date: 08/13/2021 DOB: 10-26-1960  Age: 61 y.o. MRN#: [id number] Attending Physician: Eugenie Filler, MD Primary Care Physician: Leeroy Cha, MD Admit Date: 08/05/2021  Reason for Consultation/Follow-up: {Reason for Consult:23484}  HPI/Patient Profile: 62 y.o. female  with past medical history of failure to thrive, dysphagia secondary to moderate esophageal dysmotility (barium swallow March 2023), psoriasis arthritis, CAD status post CABG, DM-2, and chronic systolic CHF LVEF 30% admitted on 08/05/2021 with AMS and fever.  Patient recently hospitalized with pneumonia and was discharged to a nursing home.  Patient has lost about 13 kg in the last 5 months per chart review.  Family reports several choking episodes at rehab.  This admission, patient diagnosed with severe sepsis secondary to ESBL E. coli bacteremia and aspiration pneumonia.  Patient with albumin less than 1.5 and ongoing severe protein calorie malnutrition.  PMT consulted to discuss goals of care.  Subjective: I met at bedside today with patient, her son [name], and Ronald's significant other.  [name] tells me that Collette has decided to proceed with a PEG tube.  I confirmed with Pearline that this was fully her decision, and that she was not feeling pressured by family - she states that it is.  Shyler states that she has decided she wants to sustain her life and realizes that she must have a PEG tube in order to keep living.  Discussed results of MBS.  On the images, there appeared to be bony protrusions on patient's anterior cervical spine at the opening of the esophagus.  This would potentially create difficulty swallowing.  Over time this would have led to  weakening of the pharyngeal muscles.  With PEG tube placement,  patient would receive the nutrition needed to potentially become stronger.  Best case scenario is if she becomes strong enough and can strengthen the pharyngeal muscles, she may be able to eat and drink for pleasure.  However it is doubtful that she would ever be able to have enough oral intake to sustain herself.  Discussed that she would likely need a feeding tube for the rest of her life.  Also discussed that Reese would need to remain extremely motivated and worked very hard with physical therapy and speech therapy.  Objective:  Physical  Exam Vitals reviewed.  Constitutional:      General: She is not in acute distress. Neurological:     Mental Status: She is alert.            Vital Signs: BP 126/72 (BP Location: Left Arm)   Pulse 86   Temp 97.7 F (36.5 C) (Oral)   Resp (!) 24   Wt 69.8 kg   SpO2 99%   BMI 24.84 kg/m  SpO2: SpO2: 99 % O2 Device: O2 Device: Nasal Cannula O2 Flow Rate: O2 Flow Rate (L/min): 2 L/min   LBM: Last BM Date : 08/11/21     Palliative Assessment/Data: ***     Palliative Medicine Assessment & Plan   Assessment: Principal Problem:   Sepsis ([place]) Active Problems:   Rheumatoid arthritis (HCC)   Gastroesophageal reflux disease without esophagitis   AMS (altered mental status)   Hypokalemia   Hypernatremia   Pressure injury of skin   Malnutrition of moderate degree   AKI (acute kidney injury) ([place])   Adult failure to thrive syndrome   Dysphagia   Chronic systolic CHF (congestive heart failure) (HCC)   Infection due to ESBL-producing Escherichia coli   Hypophosphatemia   Folate deficiency    Recommendations/Plan: ***  Goals of Care and Additional Recommendations: Limitations on Scope of Treatment: {Recommended Scope and Preferences:21019}  Code Status:   Prognosis:  {Palliative Care Prognosis:23504}  Discharge Planning: {Palliative dispostion:23505}  Care plan  was discussed with ***  Thank you for allowing the Palliative Medicine Team to assist in the care of this patient.   ***   Lavena Bullion, NP   Please contact Palliative Medicine Team phone at [phone] for questions and concerns.  For individual providers, please see AMION.

## 2021-08-13 NOTE — Progress Notes (Signed)
PT Cancellation Note  Patient Details Name: [name] [name] MRN: [id number] DOB: Jan 29, 1961   Cancelled Treatment:    Reason Eval/Treat Not Completed: Other (comment).  Refused PT session and asking for her children to come see her.   Ramond Dial 08/13/2021, 1:25 PM  Mee Hives, PT PhD Acute Rehab Dept. Number: [place] [facility] [place]

## 2021-08-13 NOTE — Telephone Encounter (Signed)
Pt son called stating his mother is currently in the hospital but feel so is not getting the proper care. He state no one has been in communication with him and has to find out all details through [employer]. Son is requesting Dr. Martinique be made aware of pt's condition

## 2021-08-13 NOTE — Telephone Encounter (Signed)
  Pt's son calling, he said, pt been in and out of the hospital. Today he was told the pt needs to get a permanent feeding tubes to have a chance to live few more months. While he is looking at [employer] he saw regarding pt's CHF. He said, nobody told him about it and asking if Dr. Martinique can explain it to him and if he can look at the pt's chart, he also, wants to get recommendations from him

## 2021-08-13 NOTE — Consult Note (Signed)
Referring Provider:  [place] Primary Care Physician:  Leeroy Cha, MD Primary Gastroenterologist:  Dr. Therisa Doyne  Reason for Consultation: Poor oral intake  HPI: [name] [name] is a 61 y.o. female with past medical history of coronary artery disease s/p CABG, history of CHF, diabetes, failure to thrive admitted to the hospital with altered mental status and fever.  Patient was recently hospitalized for empyema and pneumonia and was discharged to nursing home.  Presented back to hospital with altered mental status.  Was found to have sepsis on admission.  Subsequently found to have ESBL E. coli bacteremia.  Currently on antibiotics.  GI is consulted for further evaluation of dysphagia.  Looks like patient with history of chronic dysphagia.  She had EGD with Dr. Therisa Doyne in June 2022 which showed normal-appearing esophagus and gastritis.  Biopsies were negative for H. pylori and eosinophilic esophagitis.  He had barium swallow done in March 2023 which showed esophageal dysmotility.  Barium tablet passed in the stomach without any delay.  She had modified barium swallow done today which showed pharyngeal and cervical esophageal dysphagia with high risk for aspiration.  Past Medical History:  Diagnosis Date   Acute ST elevation myocardial infarction (STEMI) involving right coronary artery ([place]) 10/04/2015   x 2   Anxiety    Anxiety and depression    CAD (coronary artery disease)    a. CABG 06/2015: LIMA-LAD, SVG-RCA, SVG-OM2 b. STEMI s/p PCI to distal RCA lesion with a small Promus Premier stent. ( patent LIMA to the LAD, occluded SVG to OM, occluded SVG to RCA.)   CAD (coronary artery disease) of artery bypass graft; occluded SVG to RCA and occluded SVG to OM 10/08/2015   Empty sella (HCC)    GERD (gastroesophageal reflux disease)    Headache    from stress   Hematuria wed 06-13-2020   Hidradenitis axillaris    History of kidney stones    Hyperlipemia    Hypertension    Lupus ([place]) dx'd  2008   "they think I have this; have to get retested" (06/12/2015)   Obesity (BMI 30.0-34.9)    Pneumonia X 2   last time yrs ago   PONV (postoperative nausea and vomiting)    likes iv med or scopolamine patch works well   Psoriasis 06/15/2020   hands elbows, ears, small amount on scalp and bridge of nose   Rheumatoid aortitis    psorriatic arthritis   Rheumatoid arthritis ([place])    S/P angioplasty with stent; 10/04/15 to distal RCA lesion. with Promus premier. 10/08/2015   Scoliosis    Sleep apnea    patient states "it is mild" no cpap needed   Stroke [facility]) March 2014   left MCA branches; "they say I have them all the time", denies residual on 06/12/2015   Tobacco abuse    Type II diabetes mellitus ([place])    Urinary frequency    and urgency   Wears glasses    for reading    Past Surgical History:  Procedure Laterality Date   ABDOMINAL HYSTERECTOMY  31 yrs ago   total has ovaies   BREAST BIOPSY Bilateral yrd ago   benign   CARDIAC CATHETERIZATION N/A 06/13/2015   Procedure: Left Heart Cath and Coronary Angiography;  Surgeon: Peter M Martinique, MD;  Location: [place] CV [facility];  Service: Cardiovascular;  Laterality: N/A;   CARDIAC CATHETERIZATION N/A 10/04/2015   Procedure: Left Heart Cath and Coronary Angiography;  Surgeon: [name], MD;  Location: [facility]  INVASIVE CV LAB;  Service: Cardiovascular;  Laterality: N/A;   CARDIAC CATHETERIZATION N/A 10/04/2015   Procedure: Coronary Stent Intervention;  Surgeon: [name], MD;  Location: [place] CV LAB;  Service: Cardiovascular;  Laterality: N/A;   CORONARY ARTERY BYPASS GRAFT N/A 06/19/2015   Procedure: CORONARY ARTERY BYPASS GRAFTING (CABG) TIMES FOUR USING LEFT INTERNAL MAMMARY, RIGHT SAPHENOUS LEG VEIN AND CRYO SAPHENOUS VEIN;  Surgeon: [name], MD;  Location: [place];  Service: Open Heart Surgery;  Laterality: N/A;   CYSTOSCOPY WITH RETROGRADE PYELOGRAM, URETEROSCOPY AND STENT PLACEMENT Left 06/21/2020   Procedure:  CYSTOSCOPY WITH RETROGRADE PYELOGRAM, URETEROSCOPY, BASKET STONE EXTRACTION AND  STENT PLACEMENT;  Surgeon: Robley Fries, MD;  Location: [facility];  Service: Urology;  Laterality: Left;   DILATION AND CURETTAGE OF UTERUS  yrs ago   HOLMIUM LASER APPLICATION Left 05/23/1827   Procedure: HOLMIUM LASER APPLICATION;  Surgeon: Robley Fries, MD;  Location: [facility];  Service: Urology;  Laterality: Left;   HYDRADENITIS EXCISION Right 12/20/2019   Procedure: EXCISION OF RIGHT  HIDRADENITIS AXILLARY;  Surgeon: Donnie Mesa, MD;  Location: [place];  Service: General;  Laterality: Right;   IR URETERAL STENT LEFT NEW ACCESS W/O SEP NEPHROSTOMY CATH  01/10/2019   NEPHROLITHOTOMY Left 01/10/2019   Procedure: NEPHROLITHOTOMY PERCUTANEOUS;  Surgeon: Kathie Rhodes, MD;  Location: [facility];  Service: Urology;  Laterality: Left;   TEE WITHOUT CARDIOVERSION N/A 06/19/2015   Procedure: TRANSESOPHAGEAL ECHOCARDIOGRAM (TEE);  Surgeon: [name], MD;  Location: [place];  Service: Open Heart Surgery;  Laterality: N/A;   TONSILLECTOMY  age 65   TUBAL LIGATION  many yrs ago    Prior to Admission medications   Medication Sig Start Date End Date Taking? Authorizing Provider  acetaminophen (TYLENOL) 650 MG CR tablet Take 1 tablet (650 mg total) by mouth every 8 (eight) hours as needed for pain. 07/25/21  Yes Patrecia Pour, MD  amLODipine (NORVASC) 2.5 MG tablet Take 1 tablet (2.5 mg total) by mouth daily. 06/20/21  Yes Medina-Vargas, Monina C, NP  aspirin EC 81 MG EC tablet Take 1 tablet (81 mg total) by mouth daily. 06/25/15  Yes Lars Pinks M, [name]  atorvastatin (LIPITOR) 80 MG tablet TAKE 1 TABLET EVERY DAY  AT  6PM 06/20/21  Yes Medina-Vargas, Monina C, NP  B Complex-C (B-COMPLEX WITH VITAMIN C) tablet Place 1 tablet into feeding tube daily. 05/10/21  Yes Ghimire, Henreitta Leber, MD  cholecalciferol (VITAMIN D) 25 MCG (1000 UNIT) tablet Take 1,000 Units by mouth in the morning and at  bedtime.   Yes [provider]  escitalopram (LEXAPRO) 10 MG tablet Take 1 tablet (10 mg total) by mouth at bedtime. 06/20/21  Yes Medina-Vargas, Monina C, NP  Evolocumab with Infusor ([place]) 420 MG/3.5ML SOCT Inject 420 mg into the skin every 30 (thirty) days. 04/26/20  Yes Kroeger, Lorelee Cover., [name]  feeding supplement (ENSURE ENLIVE / ENSURE PLUS) LIQD Take 237 mLs by mouth 3 (three) times daily between meals. 07/29/21  Yes Patrecia Pour, MD  folic acid (FOLVITE) 1 MG tablet Take 1 tablet (1 mg total) by mouth daily. 07/26/21  Yes Patrecia Pour, MD  gabapentin (NEURONTIN) 100 MG capsule Take 1 capsule (100 mg total) by mouth 2 (two) times daily. 06/20/21  Yes Medina-Vargas, Monina C, NP  guaiFENesin (MUCINEX) 600 MG 12 hr tablet Take 1 tablet (600 mg total) by mouth 2 (two) times daily as needed (congestion.).  06/20/21  Yes Medina-Vargas, Monina C, NP  metFORMIN (GLUCOPHAGE) 500 MG tablet Take 1 tablet (500 mg total) by mouth 2 (two) times daily. 06/20/21  Yes Medina-Vargas, Monina C, NP  metoprolol tartrate (LOPRESSOR) 25 MG tablet Take 25 mg by mouth 2 (two) times daily.   Yes [provider]  nitroGLYCERIN (NITROSTAT) 0.4 MG SL tablet DISSOLVE 1 TABLET UNDER THE TONGUE EVERY 5 MINUTES AS  NEEDED FOR CHEST PAIN. MAX  OF 3 TABLETS IN 15 MINUTES. CALL 911 IF PAIN PERSISTS. 06/20/21  Yes Medina-Vargas, Monina C, NP  ondansetron (ZOFRAN-ODT) 4 MG disintegrating tablet Take 1 tablet (4 mg total) by mouth every 8 (eight) hours as needed. Patient taking differently: Take 4 mg by mouth every 8 (eight) hours as needed for nausea or vomiting. 06/20/21  Yes Medina-Vargas, Monina C, NP  pantoprazole (PROTONIX) 40 MG tablet Take 1 tablet (40 mg total) by mouth daily. 06/20/21  Yes Medina-Vargas, Monina C, NP  polyethylene glycol (MIRALAX / GLYCOLAX) 17 g packet Take 17 g by mouth daily.   Yes [provider]  thiamine (VITAMIN B-1) 100 MG tablet Take 250 mg by mouth daily.   Yes  [provider]    Scheduled Meds:  aspirin  81 mg Per Tube Daily   atorvastatin  80 mg Per Tube Daily   escitalopram  10 mg Per Tube QHS   feeding supplement (OSMOLITE 1.5 CAL)  1,000 mL Per Tube Q24H   feeding supplement (PROSource TF)  45 mL Per Tube Daily   folic acid  1 mg Per Tube Daily   free water  175 mL Per Tube Q4H   gabapentin  100 mg Per Tube Q12H   heparin  5,000 Units Subcutaneous Q8H   liver oil-zinc oxide   Topical QID   metoprolol tartrate  25 mg Per Tube BID   pantoprazole sodium  40 mg Per Tube Daily   Continuous Infusions:  albumin human Stopped (08/13/21 1310)   meropenem (MERREM) IV Stopped (08/13/21 0622)   PRN Meds:.acetaminophen, bisacodyl, HYDROmorphone (DILAUDID) injection, lip balm, loperamide HCl, ondansetron **OR** ondansetron (ZOFRAN) IV  Allergies as of 08/05/2021 - Review Complete 08/01/2021  Allergen Reaction Noted   Apremilast Other (See Comments) 11/21/2020   Latex Itching, Swelling, and Rash 05/03/2021    Family History  Problem Relation Age of Onset   Diabetes Mother    Heart failure Mother    Breast cancer Mother        47s   Stroke Maternal Aunt    Depression Son    Diabetes Other        maternal   Breast cancer Other        2 diff great maternal aunts -  both 75s   Alzheimer's disease Other        maternal    Social History   Socioeconomic History   Marital status: Widowed    Spouse name: Not on file   Number of children: 2   Years of education: college   Highest education level: Some college, no degree  Occupational History   Occupation: Disability  Tobacco Use   Smoking status: Former    Packs/day: 0.50    Years: 36.00    Pack years: 18.00    Types: Cigarettes    Quit date: 06/12/2014    Years since quitting: 7.1   Smokeless tobacco: Never  Vaping Use   Vaping Use: Never used  Substance and Sexual Activity   Alcohol use: No  Alcohol/week: 0.0 standard drinks   Drug use: No   Sexual activity:  Not Currently  Other Topics Concern   Not on file  Social History Narrative   Patient currently lives at home with her disabled husband who was shot in 2009 and he is total care. She lives with her son and daughter and there is an 8 that comes in. Patient currently is on disability   She occasionally drinks caffeine.    Social Determinants of Radio broadcast assistant Strain: Not on file  Food Insecurity: Food Insecurity Present   Worried About Charity fundraiser in the Last Year: Sometimes true   Arboriculturist in the Last Year: Sometimes true  Transportation Needs: Unmet Transportation Needs   Lack of Transportation (Medical): No   Lack of Transportation (Non-Medical): Yes  Physical Activity: Not on file  Stress: Not on file  Social Connections: Not on file  Intimate Partner Violence: Not on file    Review of Systems: All negative except as stated above in HPI.  Physical Exam: Vital signs: Vitals:   08/13/21 0757 08/13/21 1214  BP: (!) 148/85 126/72  Pulse: 86   Resp:    Temp: 97.7 F (36.5 C)   SpO2: 99%    Last BM Date : 08/11/21 General:   Resting in  the bed, not in acute distress Lungs:  Clear throughout to auscultation.   No wheezes, crackles, or rhonchi. No acute distress. Heart:  Regular rate and rhythm; no murmurs, clicks, rubs,  or gallops. Abdomen: Soft, nontender, nondistended, bowel sounds present, no peritoneal signs Rectal:  Deferred  GI:  Lab Results: No results for input(s): WBC, HGB, HCT, PLT in the last 72 hours. BMET Recent Labs    08/11/21 0252 08/12/21 0229 08/13/21 0839  NA 141 140 141  K 4.3 4.5 4.3  CL 113* 111 107  CO2 '25 25 28  '$ GLUCOSE 149* 125* 134*  BUN 6* 6* 6*  CREATININE 0.46 0.42* 0.42*  CALCIUM 7.3* 7.9* 8.4*   LFT Recent Labs    08/13/21 0839  ALBUMIN 2.4*   PT/INR No results for input(s): LABPROT, INR in the last 72 hours.   Studies/Results: No results found.  Impression/Plan: -Poor oral intake.   Probably multifactorial.  Patient with decreased appetite.  According to her, she coughs and chokes while swallowing and also she likes to eat slowly .  EGD showed normal-appearing esophagus last year.  Barium swallow March 2023 showed no evidence of stricture, moderate esophageal dysmotility.  Barium tablet passed in the stomach without any delay. -Sepsis with bacteremia -Aspiration pneumonia -Altered mental status.  Improving.  Recommendations ------------------------- -Patient's dysphagia is multifactorial and I do not think repeating EGD would change her management.  Patient denies any odynophagia. -Recommend slowly advance diet per speech -Called and discussed with patient's son in detail. -recommend IR consult for PEG tube placement If family decided for PEG tube placement for nutrition -GI will follow    LOS: 8 days   Otis Brace  MD, FACP 08/13/2021, 2:27 PM  Contact #  [phone]

## 2021-08-13 NOTE — Progress Notes (Signed)
Occupational Therapy Treatment Patient Details Name: [name] [name] MRN: [id number] DOB: 1960-10-09 Today's Date: 08/13/2021   History of present illness [name] [name] is a 61 y.o. female with medical history significant of failure to thrive, dysphagia secondary to moderate esophageal dysmotility (barium swallow March 2023), psoriasis arthritis, CAD status post CABG, DM-2, chronic systolic CHF LVEF 98%, was sent to hospital for evaluation of altered mentations hypoxia and fever.   OT comments  Pt with minimal progress towards OT goals today, limited by weakness but also with self limiting behaviors. Session focused on attempting to increase independence with basic self care tasks (bed mobility, suction device use, and IS use) though pt with minimal effort to engage at this time. Continue to rec SNF at DC as pt will require 24/7 extensive assist with ADLs/mobility.    Recommendations for follow up therapy are one component of a multi-disciplinary discharge planning process, led by the attending physician.  Recommendations may be updated based on patient status, additional functional criteria and insurance authorization.    Follow Up Recommendations  Skilled nursing-short term rehab (<3 hours/day)    Assistance Recommended at Discharge Frequent or constant Supervision/Assistance  Patient can return home with the following  A lot of help with walking and/or transfers;A lot of help with bathing/dressing/bathroom;Direct supervision/assist for medications management;Direct supervision/assist for financial management   Equipment Recommendations  Wheelchair (measurements OT);Wheelchair cushion (measurements OT);Hospital bed    Recommendations for Other Services      Precautions / Restrictions Precautions Precautions: Fall;Other (comment) Precaution Comments: cortrak Restrictions Weight Bearing Restrictions: No       Mobility Bed Mobility Overal bed mobility: Needs Assistance Bed  Mobility: Rolling Rolling: Total assist         General bed mobility comments: poor assist to initiate and resistant to roll towards back despite OT attempts to untangle O2 cord from under gown. declined EOB attempts    Transfers                         Balance                                           ADL either performed or assessed with clinical judgement   ADL Overall ADL's : Needs assistance/impaired                                       General ADL Comments: Attempted to guide in small tasks to increase independence and participate in care (bed mobility, coordination for manipulation of suction device, IS use for lung health) with overall poor participation and self limiting behaviors.    Extremity/Trunk Assessment Upper Extremity Assessment Upper Extremity Assessment: Generalized weakness;RUE deficits/detail;LUE deficits/detail RUE Deficits / Details: R elbow with impaired extension, unable to FF >30 degrees in shoulder RUE Coordination: decreased fine motor LUE Deficits / Details: extension impaired in L elbow but able to FF WNL in shoulder LUE Coordination: decreased fine motor   Lower Extremity Assessment Lower Extremity Assessment: Defer to PT evaluation        Vision   Vision Assessment?: No apparent visual deficits   Perception     Praxis      Cognition Arousal/Alertness: Awake/alert Behavior During Therapy: Flat affect Overall Cognitive Status: No family/caregiver present to  determine baseline cognitive functioning Area of Impairment: Following commands, Awareness, Problem solving, Safety/judgement                       Following Commands: Follows one step commands inconsistently, Follows one step commands with increased time Safety/Judgement: Decreased awareness of deficits Awareness: Intellectual Problem Solving: Slow processing, Decreased initiation, Difficulty sequencing, Requires verbal cues,  Requires tactile cues General Comments: Pt with self limiting behaviors with decreased awareness of deficits and need to participate to progress functional skills.        Exercises      Shoulder Instructions       General Comments VSS on 2 L O2    Pertinent Vitals/ Pain       Pain Assessment Pain Assessment: Faces Faces Pain Scale: Hurts little more Pain Location: L side (refused to roll onto back towards L side to avoid pain) Pain Descriptors / Indicators: Sore Pain Intervention(s): Monitored during session  Home Living                                          Prior Functioning/Environment              Frequency  Min 2X/week        Progress Toward Goals  OT Goals(current goals can now be found in the care plan section)  Progress towards OT goals: Not progressing toward goals - comment (self limiting)  Acute Rehab OT Goals Patient Stated Goal: to not be bothered OT Goal Formulation: With patient Time For Goal Achievement: 08/24/21 Potential to Achieve Goals: Fair ADL Goals Pt Will Perform Grooming: with min assist;sitting Pt Will Perform Upper Body Bathing: with mod assist;sitting Pt Will Perform Lower Body Bathing: with min assist;sitting/lateral leans;sit to/from stand;with adaptive equipment Pt Will Perform Upper Body Dressing: with max assist;with mod assist;sitting Pt Will Perform Lower Body Dressing: with min assist;with adaptive equipment;sitting/lateral leans;sit to/from stand Pt Will Transfer to Toilet: with max assist;with mod assist;with +2 assist;stand pivot transfer Pt/caregiver will Perform Home Exercise Program: Increased ROM;Increased strength;Both right and left upper extremity;With theraband;With minimal assist;With written HEP provided Additional ADL Goal #1: Pt will complete bed mobility max - mod A in to sit EOB in prep for ADL tasks Additional ADL Goal #2: Pt will sit EOB x 5-10 minutes for grooming and UB ADLS with  mod/min A for balance and support Additional ADL Goal #3: Pt will tolerate P/AAROM B UEs  Plan Discharge plan remains appropriate    Co-evaluation                 AM-PAC OT "6 Clicks" Daily Activity     Outcome Measure   Help from another person eating meals?: A Little Help from another person taking care of personal grooming?: A Lot Help from another person toileting, which includes using toliet, bedpan, or urinal?: Total Help from another person bathing (including washing, rinsing, drying)?: Total Help from another person to put on and taking off regular upper body clothing?: A Lot Help from another person to put on and taking off regular lower body clothing?: Total 6 Click Score: 10    End of Session Equipment Utilized During Treatment: Oxygen  OT Visit Diagnosis: Other abnormalities of gait and mobility (R26.89);Muscle weakness (generalized) (M62.81);Adult, failure to thrive (R62.7);Pain Pain - Right/Left: Left Pain - part of body:  (left side)  Activity Tolerance Patient limited by fatigue;Other (comment) (self limiting)   Patient Left in bed;with call bell/phone within reach   Nurse Communication Mobility status        Time: 1205-1221 OT Time Calculation (min): 16 min  Charges: OT General Charges $OT Visit: 1 Visit OT Treatments $Therapeutic Activity: 8-22 mins  Malachy Chamber, OTR/L Acute Rehab Services Office: [phone]   Layla Maw 08/13/2021, 1:14 PM

## 2021-08-14 ENCOUNTER — Inpatient Hospital Stay (HOSPITAL_COMMUNITY): Payer: Medicare HMO

## 2021-08-14 DIAGNOSIS — A419 Sepsis, unspecified organism: Secondary | ICD-10-CM | POA: Diagnosis not present

## 2021-08-14 DIAGNOSIS — G9340 Encephalopathy, unspecified: Secondary | ICD-10-CM | POA: Diagnosis not present

## 2021-08-14 DIAGNOSIS — R6521 Severe sepsis with septic shock: Secondary | ICD-10-CM | POA: Diagnosis not present

## 2021-08-14 LAB — GLUCOSE, CAPILLARY
Glucose-Capillary: 137 mg/dL — ABNORMAL HIGH (ref 70–99)
Glucose-Capillary: 134 mg/dL — ABNORMAL HIGH (ref 70–99)
Glucose-Capillary: 120 mg/dL — ABNORMAL HIGH (ref 70–99)
Glucose-Capillary: 117 mg/dL — ABNORMAL HIGH (ref 70–99)
Glucose-Capillary: 111 mg/dL — ABNORMAL HIGH (ref 70–99)

## 2021-08-14 LAB — RENAL FUNCTION PANEL
Albumin: 2.6 g/dL — ABNORMAL LOW (ref 3.5–5.0)
Anion gap: 6 (ref 5–15)
BUN: 6 mg/dL — ABNORMAL LOW (ref 8–23)
CO2: 29 mmol/L (ref 22–32)
Calcium: 8.7 mg/dL — ABNORMAL LOW (ref 8.9–10.3)
Chloride: 105 mmol/L (ref 98–111)
Creatinine, Ser: 0.54 mg/dL (ref 0.44–1.00)
GFR, Estimated: >60 mL/min (ref >60)
Glucose, Bld: 126 mg/dL — ABNORMAL HIGH (ref 70–99)
Phosphorus: 2.1 mg/dL — ABNORMAL LOW (ref 2.5–4.6)
Potassium: 4.2 mmol/L (ref 3.5–5.1)
Sodium: 140 mmol/L (ref 135–145)

## 2021-08-14 MED ORDER — JEVITY 1.5 CAL/FIBER PO LIQD
1000.0000 mL | ORAL | Status: DC
Start: 2021-08-14 — End: 2021-08-21
  Administered 2021-08-14 – 2021-08-19 (×5): 1000 mL
  Filled 2021-08-14 (×11): qty 1000

## 2021-08-14 MED ORDER — K PHOS MONO-SOD PHOS DI & MONO 155-852-130 MG PO TABS
250.0000 mg | ORAL_TABLET | Freq: Every day | ORAL | Status: DC
  Administered 2021-08-14 – 2021-08-26 (×13): 250 mg
  Filled 2021-08-14 (×13): qty 1

## 2021-08-14 MED ORDER — TRAMADOL HCL 50 MG PO TABS
50.0000 mg | ORAL_TABLET | Freq: Two times a day (BID) | ORAL | Status: DC | PRN
  Administered 2021-08-14 – 2021-08-22 (×6): 50 mg via ORAL
  Filled 2021-08-14 (×7): qty 1

## 2021-08-14 MED ORDER — PROSOURCE TF PO LIQD
45.0000 mL | Freq: Two times a day (BID) | ORAL | Status: DC
  Administered 2021-08-14 – 2021-08-26 (×25): 45 mL
  Filled 2021-08-14 (×24): qty 45

## 2021-08-14 NOTE — Progress Notes (Signed)
Request to IR for percutaneous gastrostomy placement -- patient imaging reviewed by Dr. Kathlene Cote who approves anatomy.   Patient will need to hold ASA x 5 days (last dose today per [facility]), will plan for placement 6/6.   IR APP will see patient for consult/consent this week.  Candiss Norse, [name]

## 2021-08-14 NOTE — Progress Notes (Signed)
[facility] Gastroenterology Progress Note  [name] [name] 61 y.o. 11-12-1960  CC: Poor oral intake   Subjective: Patient seen and examined at bedside.  Just came back from CT scan.  Denies any overnight GI issues.  ROS : Afebrile.    Objective: Vital signs in last 24 hours: Vitals:   08/14/21 0426 08/14/21 0805  BP: (!) 145/90 (!) 145/87  Pulse: 81   Resp: 20 20  Temp: 98.1 F (36.7 C) 98.1 F (36.7 C)  SpO2: 98% 98%    Physical Exam:  General -sitting comfortably.  Not in acute distress Abdomen -mildly distended, bowel sounds present, no peritoneal signs.  Abdomen is nontender.  Lab Results: Recent Labs    08/12/21 0229 08/13/21 0839 08/14/21 0407  NA 140 141 140  K 4.5 4.3 4.2  CL 111 107 105  CO2 '25 28 29  '$ GLUCOSE 125* 134* 126*  BUN 6* 6* 6*  CREATININE 0.42* 0.42* 0.54  CALCIUM 7.9* 8.4* 8.7*  MG 2.0 1.8  --   PHOS 1.5* 3.5 2.1*   Recent Labs    08/13/21 0839 08/14/21 0407  ALBUMIN 2.4* 2.6*   No results for input(s): WBC, NEUTROABS, HGB, HCT, MCV, PLT in the last 72 hours. No results for input(s): LABPROT, INR in the last 72 hours.    Assessment/Plan: -Poor oral intake.  Probably multifactorial.  Patient with decreased appetite.   -Oropharyngeal dysphagia    EGD 08/2020 showed normal-appearing esophagus last year.    Barium swallow March 2023 showed no evidence of stricture, moderate esophageal dysmotility.  Barium tablet passed in the stomach without any delay.  -Sepsis with bacteremia -Aspiration pneumonia -Altered mental status.  Improving.   Recommendations ------------------------- -Repeating EGD will not decrease risk for aspiration pneumonia and it will not change her desire to eat.  EGD would also not improve her esophageal dysmotility.   -Primary team is planning for IR guided PEG tube placement.  -No further GI work-up planned.  GI will sign off.  Call us back if needed  Otis Brace MD, [place] 08/14/2021, 12:33 PM  Contact  #  [phone]

## 2021-08-14 NOTE — Hospital Course (Addendum)
61 y.o. fw/ history significant of failure to thrive, dysphagia secondary to moderate esophageal dysmotility (barium swallow March 2023), psoriasis arthritis, CAD s/p CABG, DM-2, chronic systolic CHF LVEF 57%, was sent to hospital for evaluation of altered mentations hypoxia and fever. Recently hospitalized and discharged to nursing home 11 days PTA after treatment of supposed empyema and pneumonia. Patient has established history of failure to thrive and has been losing weight, and according to the record she lost about 13 kg in last 76-month.  Per report she was discharged on soft diet, last week, her diet was downgraded to pured. Despite, family observed several times patient had choking episode after eating for food. In ED- hypoxic initially briefly on 15 L of nonrebreather and titrated down to 5 L, tachycardia hypotensive but responded to IV fluid.  Febrile 101.4 CTAPE-negative for PE but patchy infiltrates on bilateral lower lung fields suspicious for pneumonia.Small right-sided pleural effusion. Patient is admitted for severe sepsis secondary to aspiration pneumonia and Bacteremia with ESBL E Coli -being managed with  Meropenem-total course 7 days, placed on Cortrak for feeding. Seen by GI who felt nothing further to add in terms of management- and if agreeable consider PEG. Seen by palliative care and have had multiple conversations with patient and family.IR evaluation placed for PEG tube placement if family agreeable for failure to thrive/dysphagia/severe protein-calorie malnutrition.. Once PEG tube is placed, core track removed can be discharged to SNF.  AKI hyponatremia hyperkalemia has resolved.  Also had hypokalemia hypophosphatemia metabolic encephalopathy.

## 2021-08-14 NOTE — Progress Notes (Signed)
Nutrition Follow-up   DOCUMENTATION CODES:   Non-severe (moderate) malnutrition in context of chronic illness  INTERVENTION:   Continue tube feeding via Cortrak: Change to Jevity 1.5 at 50 ml/h (1200 ml per day) Increase Prosource TF to 45 ml BID  Provides 1880 kcal, 99 gm protein, 912 ml free water daily.  Continue free water flushes 175 ml every 4 hours for a total of 1962 ml free water daily.  NUTRITION DIAGNOSIS:   Moderate Malnutrition related to chronic illness (dysphagia) as evidenced by mild muscle depletion, moderate muscle depletion, mild fat depletion, moderate fat depletion.  Ongoing   GOAL:   Patient will meet greater than or equal to 90% of their needs  Progressing  MONITOR:   Skin, Labs, Diet advancement  REASON FOR ASSESSMENT:   Malnutrition Screening Tool    ASSESSMENT:   61 yo female admitted with whole body pain. PMH includes HTN, lupus, hidradenitis axillaris, scoliosis, anxiety, CAD, stroke, STEMI, DM-2, GERD, psoriatic arthritis, FTT, dysphagia.  SLP following. Patient remains NPO. Cortrak tube was placed 5/24, tip is in the distal stomach per x-ray. Currently receiving Osmolite 1.5 at 55 ml/h with Prosource TF 45 ml once daily. Patient is tolerating well to meet 100% of estimated nutrition needs. Receiving free water flushes 175 ml every 4 hours.  She is having type 7 stools; RD to change TF formula to one with fiber.  Palliative Care team is following.  Plans for PEG placement by IR on 6/6.  Labs reviewed. phos 2.1 CBG: [phone]  Medications reviewed and include folic acid, Protonix. Receiving K-Phos for hypophosphatemia.  New weight taken 5/27: 74.6 kg Weight 5/29:  69.8 kg Usual weight 65-70 kg for the past 3 months.  Diet Order:   Diet Order             Diet NPO time specified Except for: Ice Chips  Diet effective now                   EDUCATION NEEDS:   No education needs have been identified at this time  Skin:   Skin Assessment: Skin Integrity Issues: Skin Integrity Issues:: Stage II Stage II: R buttocks, R hip  Last BM:  5/31 type 7  Height:   Ht Readings from Last 1 Encounters:  07/30/21 '5\' 6"'$  (1.676 m)    Weight:   Wt Readings from Last 1 Encounters:  08/12/21 69.8 kg     BMI:  Body mass index is 24.84 kg/m.  Estimated Nutritional Needs:   Kcal:  1800-2000  Protein:  90-105 gm  Fluid:  >/= 1.8 L    Lucas Mallow RD, LDN, CNSC Please refer to Amion for contact information.

## 2021-08-14 NOTE — Progress Notes (Addendum)
Physical Therapy Treatment Patient Details Name: [name] [name] MRN: [id number] DOB: 1960-09-13 Today's Date: 08/14/2021   History of Present Illness [name] [name] is a 61 y.o. female with medical history significant of failure to thrive, dysphagia secondary to moderate esophageal dysmotility (barium swallow March 2023), psoriasis arthritis, CAD status post CABG, DM-2, chronic systolic CHF LVEF 45%, was sent to hospital for evaluation of altered mentations hypoxia and fever.    PT Comments    Pt received supine with minimal progress towards acute goals secondary to weakness but also declining OOB/EOB mobility attempts and only agreeable to repositioning and bed level therex with max encouragement.  Pt able to initiate pulling with BUE to slide up and in bed and to pull to roll, and complete with max assist. Current plan remains appropriate to address deficits and maximize functional independence and decrease caregiver burden. Pt continues to benefit from skilled PT services to progress toward functional mobility goals.    Recommendations for follow up therapy are one component of a multi-disciplinary discharge planning process, led by the attending physician.  Recommendations may be updated based on patient status, additional functional criteria and insurance authorization.  Follow Up Recommendations  Skilled nursing-short term rehab (<3 hours/day)     Assistance Recommended at Discharge Frequent or constant Supervision/Assistance  Patient can return home with the following Two people to help with walking and/or transfers;Two people to help with bathing/dressing/bathroom   Equipment Recommendations  None recommended by PT    Recommendations for Other Services       Precautions / Restrictions Precautions Precautions: Fall;Other (comment) Precaution Comments: cortrak Restrictions Weight Bearing Restrictions: No     Mobility  Bed Mobility Overal bed mobility: Needs  Assistance Bed Mobility: Rolling Rolling: Max assist   Supine to sit: Total assist Sit to supine: Total assist   General bed mobility comments: poor assist to initiate and resistant to roll towards L, max cues to pull with UE on rail. Able to pull with BUE on right rail and push with LLE to scoot up in bed    Transfers            General transfer comment: unable/unsafe at this time    Ambulation/Gait      General Gait Details: unable to attempt   Stairs             Wheelchair Mobility    Modified Rankin (Stroke Patients Only)       Balance Overall balance assessment: Needs assistance Sitting-balance support: Bilateral upper extremity supported, Feet supported Sitting balance-Leahy Scale: Poor Sitting balance - Comments: not assessed Postural control: Left lateral lean Standing balance support: Bilateral upper extremity supported Standing balance-Leahy Scale: Poor Standing balance comment: stood from EOB with face to face technique                            Cognition Arousal/Alertness: Awake/alert Behavior During Therapy: Flat affect Overall Cognitive Status: No family/caregiver present to determine baseline cognitive functioning Area of Impairment: Following commands, Awareness, Problem solving, Safety/judgement                       Following Commands: Follows one step commands inconsistently, Follows one step commands with increased time Safety/Judgement: Decreased awareness of deficits Awareness: Intellectual Problem Solving: Slow processing, Decreased initiation, Difficulty sequencing, Requires verbal cues, Requires tactile cues General Comments: Pt with self limiting behaviors with decreased awareness of deficits and need to  participate to progress functional skills.        Exercises General Exercises - Lower Extremity Ankle Circles/Pumps: Both, Supine, PROM, AROM, 10 reps Quad Sets: Both, 10 reps, Supine, AROM Heel  Slides: Both, 10 reps, Supine, AROM, AAROM Hip ABduction/ADduction: Both, 20 reps, AROM, AAROM Hip Flexion/Marching: PROM, Both, 10 reps, Supine    General Comments General comments (skin integrity, edema, etc.): VSS on 2L o2      Pertinent Vitals/Pain Pain Assessment Pain Assessment: Faces Faces Pain Scale: Hurts little more Pain Location: back Pain Descriptors / Indicators: Sore Pain Intervention(s): Monitored during session, Limited activity within patient's tolerance, Repositioned    Home Living                          Prior Function            PT Goals (current goals can now be found in the care plan section) Acute Rehab PT Goals Patient Stated Goal: none stated PT Goal Formulation: With patient Time For Goal Achievement: 08/23/21    Frequency    Min 2X/week      PT Plan Current plan remains appropriate    Co-evaluation              AM-PAC PT "6 Clicks" Mobility   Outcome Measure  Help needed turning from your back to your side while in a flat bed without using bedrails?: Total Help needed moving from lying on your back to sitting on the side of a flat bed without using bedrails?: Total Help needed moving to and from a bed to a chair (including a wheelchair)?: Total Help needed standing up from a chair using your arms (e.g., wheelchair or bedside chair)?: Total Help needed to walk in hospital room?: Total Help needed climbing 3-5 steps with a railing? : Total 6 Click Score: 6    End of Session Equipment Utilized During Treatment: Oxygen Activity Tolerance: Patient limited by pain Patient left: in bed;with call bell/phone within reach Nurse Communication: Mobility status;Other (comment) PT Visit Diagnosis: Muscle weakness (generalized) (M62.81);Pain;Other abnormalities of gait and mobility (R26.89) Pain - Right/Left:  (bilateral, all over) Pain - part of body: Knee (ears)     Time: 1610-9604 PT Time Calculation (min) (ACUTE ONLY):  25 min  Charges:  $Therapeutic Exercise: 8-22 mins $Therapeutic Activity: 8-22 mins                     Patton Rabinovich R. PTA Acute Rehabilitation Services Office: [phone]    Catalina Antigua 08/14/2021, 2:04 PM

## 2021-08-14 NOTE — Progress Notes (Signed)
PROGRESS NOTE [name] [name]  DJM:[id number] DOB: [birth year] DOA: 08/05/2021 PCP: Leeroy Cha, MD   Brief Narrative/Hospital Course: 61 y.o. fw/ history significant of failure to thrive, dysphagia secondary to moderate esophageal dysmotility (barium swallow March 2023), psoriasis arthritis, CAD s/p CABG, DM-2, chronic systolic CHF LVEF 22%, was sent to hospital for evaluation of altered mentations hypoxia and fever. Recently hospitalized and discharged to nursing home 11 days PTA after treatment of supposed empyema and pneumonia. Patient has established history of failure to thrive and has been losing weight, and according to the record she lost about 13 kg in last 8-month.  Per report she was discharged on soft diet, last week, her diet was downgraded to pured. Despite, family observed several times patient had choking episode after eating for food. In ED- hypoxic initially briefly on 15 L of nonrebreather and titrated down to 5 L, tachycardia hypotensive but responded to IV fluid.  Febrile 101.4 CTAPE-negative for PE but patchy infiltrates on bilateral lower lung fields suspicious for pneumonia.Small right-sided pleural effusion. Patient is admitted for severe sepsis secondary to aspiration pneumonia and Bacteremia with ESBL E Coli -being managed with  Meropenem-total course 7 days, placed on Cortrak for feeding. Seen by GI who felt nothing further to add in terms of management- and if agreeable consider PEG. Seen by palliative care and have had multiple conversations with patient and family.IR evaluation placed for PEG tube placement if family agreeable for failure to thrive/dysphagia/severe protein-calorie malnutrition.. Once PEG tube is placed, core track removed can be discharged to SNF.  AKI hyponatremia hyperkalemia has resolved.  Also had hypokalemia hypophosphatemia metabolic encephalopathy.    Subjective: Seen and examined this morning patient is alert awake oriented pleasant  conversant.  Core track in place 2 L nasal cannula in place.  She is going for CT scan Seen again after CT scan.  Patient reports agreeable for PEG tube as discussed yesterday Assessment and Plan: Principal Problem:   Sepsis ([place] Active Problems:   Rheumatoid arthritis ([place]   Gastroesophageal reflux disease without esophagitis   AMS (altered mental status)   Hypokalemia   Hypernatremia   Pressure injury of skin   Malnutrition of moderate degree   AKI (acute kidney injury) ([place]   Adult failure to thrive syndrome   Dysphagia   Chronic systolic CHF (congestive heart failure) (HCC)   Infection due to ESBL-producing Escherichia coli   Hypophosphatemia   Folate deficiency  Severe sepsis due to ESBL E. coli bacteremia Aspiration pneumonia Possible UTI Acute respiratory with hypoxia/lactic acidosis: Blood culture ESBL E. coli seen by ID now on meropenem to complete the course x7 days.  Sepsis physiology resolved.  Hemodynamically stable.  Failure to thrive Dysphagia-esophageal dysmotility Severe protein calorie malnutrition POA: Patient esophageal dysmotility noted to have had swallowing problems, seen by speech back in March with inadequate nutrition severe hypoalbuminemia.  Getting tube feed with core track, dietitian following, PMT following, seen by GI-EGD would not improve dysmotility or decrease of aspiration pneumonia and will not change her discharge.IR consulted for PEG tube placement.  CT scan pending.  IR advised to hold aspirin x5 days  Acute metabolic encephalopathy in the setting of sepsis: Overall improved conversant alert awake oriented  AKI: Creatinine improved to 0.5 in the setting of sepsis dehydration volume contraction  Hyperkalemia resolved Hypokalemia/hypophosphatemia: Replete and monitor  Hypertension Chronic systolic CHF: BP remains stable, continue Lopressor home regimen, monitor fluid status, CHF is compensated. Net IO Since Admission: 13,677.91 mL  [08/14/21 1107] ?  Accuracy.  GERD continue PPI  Anemia/folate deficiency without overt bleeding monitor H&H transfuse for less than 7 g.Anemia panel with iron level of 19, folate of 4.0, ferritin at 1850, TIBC not calculated.  Vitamin B12 levels of 421  Pseudohypocalcemia correctedcalcium stable  Psoriasis arthritis: On outpatient follow-up with rheumatology Pressure injury POA Pressure Injury 08/05/21 Buttocks Right Stage 2 -  Partial thickness loss of dermis presenting as a shallow open injury with a red, pink wound bed without slough. red, no drainage (Active)  08/05/21 1835  Location: Buttocks  Location Orientation: Right  Staging: Stage 2 -  Partial thickness loss of dermis presenting as a shallow open injury with a red, pink wound bed without slough.  Wound Description (Comments): red, no drainage  Present on Admission: Yes     Pressure Injury 08/05/21 Hip Right Stage 2 -  Partial thickness loss of dermis presenting as a shallow open injury with a red, pink wound bed without slough. (Active)  08/05/21 1837  Location: Hip  Location Orientation: Right  Staging: Stage 2 -  Partial thickness loss of dermis presenting as a shallow open injury with a red, pink wound bed without slough.  Wound Description (Comments):   Present on Admission: Yes      DVT prophylaxis: heparin injection 5,000 Units Start: 08/05/21 1545 Code Status:   Code Status: Full Code Family Communication: plan of care discussed with patient/ at bedside. Patient status is: Inpatient because of ongoing need for further procedure of PEG tube Level of care: Telemetry Medical   Dispo: The patient is from: [place]            Anticipated disposition: Skilled nursing facility once tolerating tube feeds after PEG placement and core track is removed  Mobility Assessment (last 72 hours)     Mobility Assessment     Row Name 08/13/21 1323 08/13/21 1300 08/13/21 0900 08/12/21 2100 08/11/21 2200    Does patient have an order for bedrest or is patient medically unstable -- -- Yes- Bedfast (Level 1) - Complete Yes- Bedfast (Level 1) - Complete Yes- Bedfast (Level 1) - Complete   What is the highest level of mobility based on the progressive mobility assessment? Level 1 (Bedfast) - Unable to balance while sitting on edge of bed Level 1 (Bedfast) - Unable to balance while sitting on edge of bed Level 1 (Bedfast) - Unable to balance while sitting on edge of bed Level 1 (Bedfast) - Unable to balance while sitting on edge of bed Level 1 (Bedfast) - Unable to balance while sitting on edge of bed   Is the above level different from baseline mobility prior to current illness? -- -- Yes - Recommend PT order Yes - Recommend PT order --             Objective: Vitals last 24 hrs: Vitals:   08/13/21 1214 08/13/21 2015 08/14/21 0426 08/14/21 0805  BP: 126/72 119/77 (!) 145/90 (!) 145/87  Pulse:  95 81   Resp:  '20 20 20  '$ Temp:  98.4 F (36.9 C) 98.1 F (36.7 C) 98.1 F (36.7 C)  TempSrc:  Oral Oral Oral  SpO2:  98% 98% 98%  Weight:       Weight change:   Physical Examination: General exam: alert awake, conversant and  older appearing,older than stated age, weak appearing. HEENT:Oral mucosa moist, Ear/Nose WNL grossly, dentition normal. Respiratory system: bilaterally diminished BS, no use of accessory muscle Cardiovascular system: S1 & S2 +, No  JVD. Gastrointestinal system: Abdomen soft,NT,ND, BS+ Nervous System:Alert, awake, moving extremities and grossly nonfocal Extremities: LE edema mild,distal peripheral pulses palpable.  Skin: No rashes,no icterus. MSK: Normal muscle bulk,tone, power  Medications reviewed:  Scheduled Meds:  aspirin  81 mg Per Tube Daily   atorvastatin  80 mg Per Tube Daily   escitalopram  10 mg Per Tube QHS   feeding supplement (OSMOLITE 1.5 CAL)  1,000 mL Per Tube Q24H   feeding supplement (PROSource TF)  45 mL Per Tube Daily   folic acid  1 mg Per Tube  Daily   free water  175 mL Per Tube Q4H   gabapentin  100 mg Per Tube Q12H   heparin  5,000 Units Subcutaneous Q8H   liver oil-zinc oxide   Topical QID   metoprolol tartrate  25 mg Per Tube BID   pantoprazole sodium  40 mg Per Tube Daily   phosphorus  250 mg Per Tube Daily   Continuous Infusions:  meropenem (MERREM) IV 1 g (08/14/21 0523)    Pressure Injury 05/01/21 Sacrum Mid Stage 1 -  Intact skin with non-blanchable redness of a localized area usually over a bony prominence. (Active)  05/01/21 0400  Location: Sacrum  Location Orientation: Mid  Staging: Stage 1 -  Intact skin with non-blanchable redness of a localized area usually over a bony prominence.  Wound Description (Comments):   Present on Admission: Yes     Pressure Injury 05/08/21 Buttocks Left;Right Stage 2 -  Partial thickness loss of dermis presenting as a shallow open injury with a red, pink wound bed without slough. (Active)  05/08/21 1951  Location: Buttocks  Location Orientation: Left;Right  Staging: Stage 2 -  Partial thickness loss of dermis presenting as a shallow open injury with a red, pink wound bed without slough.  Wound Description (Comments):   Present on Admission:      Pressure Injury 08/05/21 Buttocks Right Stage 2 -  Partial thickness loss of dermis presenting as a shallow open injury with a red, pink wound bed without slough. red, no drainage (Active)  08/05/21 1835  Location: Buttocks  Location Orientation: Right  Staging: Stage 2 -  Partial thickness loss of dermis presenting as a shallow open injury with a red, pink wound bed without slough.  Wound Description (Comments): red, no drainage  Present on Admission: Yes  Dressing Type Foam - Lift dressing to assess site every shift 08/12/21 2100     Pressure Injury 08/05/21 Hip Right Stage 2 -  Partial thickness loss of dermis presenting as a shallow open injury with a red, pink wound bed without slough. (Active)  08/05/21 1837  Location: Hip   Location Orientation: Right  Staging: Stage 2 -  Partial thickness loss of dermis presenting as a shallow open injury with a red, pink wound bed without slough.  Wound Description (Comments):   Present on Admission: Yes  Dressing Type Foam - Lift dressing to assess site every shift 08/12/21 2100   Diet Order             Diet NPO time specified Except for: Ice Chips  Diet effective now                    Nutrition Problem: Moderate Malnutrition Etiology: chronic illness (dysphagia) Signs/Symptoms: mild muscle depletion, moderate muscle depletion, mild fat depletion, moderate fat depletion Interventions: Refer to RD note for recommendations   Intake/Output Summary (Last 24 hours) at 08/14/2021 1108 Last data filed at  08/13/2021 1344 Gross per 24 hour  Intake 603.48 ml  Output --  Net 603.48 ml   Net IO Since Admission: 13,677.91 mL [08/14/21 1108]  Wt Readings from Last 3 Encounters:  08/12/21 69.8 kg  07/30/21 68 kg  07/29/21 68.6 kg     Unresulted Labs (From admission, onward)     Start     Ordered   08/08/21 0500  Renal function panel  Daily,   R     Question:  Specimen collection method  Answer:  Lab=Lab collect   08/07/21 1745          Data Reviewed: I have personally reviewed following labs and imaging studies CBC: Recent Labs  Lab 08/08/21 0416 08/09/21 0638 08/10/21 0236  WBC 7.4 5.6 6.0  HGB 10.1* 11.6* 10.0*  HCT 31.9* 36.4 30.9*  MCV 96.1 94.5 93.1  PLT 148* 124* 314*   Basic Metabolic Panel: Recent Labs  Lab 08/08/21 1515 08/09/21 9702 08/10/21 0236 08/11/21 0252 08/12/21 0229 08/13/21 0839 08/14/21 0407  NA  --  139 134* 141 140 141 140  K  --  4.0 4.1 4.3 4.5 4.3 4.2  CL  --  112* 107 113* 111 107 105  CO2  --  '22 22 25 25 28 29  '$ GLUCOSE  --  233* 168* 149* 125* 134* 126*  BUN  --  8 7* 6* 6* 6* 6*  CREATININE  --  0.51 0.44 0.46 0.42* 0.42* 0.54  CALCIUM  --  7.5* 6.9* 7.3* 7.9* 8.4* 8.7*  MG 1.8 1.7 2.0  --  2.0 1.8  --    PHOS 2.6 1.3*  1.3* 1.2* 2.0* 1.5* 3.5 2.1*   GFR: Estimated Creatinine Clearance: 68.3 mL/min (by C-G formula based on SCr of 0.54 mg/dL). Liver Function Tests: Recent Labs  Lab 08/10/21 0236 08/11/21 0252 08/12/21 0229 08/13/21 0839 08/14/21 0407  ALBUMIN <1.5* <1.5* 2.1* 2.4* 2.6*   No results for input(s): LIPASE, AMYLASE in the last 168 hours. No results for input(s): AMMONIA in the last 168 hours. Coagulation Profile: No results for input(s): INR, PROTIME in the last 168 hours. BNP (last 3 results) No results for input(s): PROBNP in the last 8760 hours. HbA1C: No results for input(s): HGBA1C in the last 72 hours. CBG: Recent Labs  Lab 08/13/21 1644 08/13/21 2017 08/13/21 2316 08/14/21 0425 08/14/21 0802  GLUCAP 95 110* 155* 137* 134*   Lipid Profile: No results for input(s): CHOL, HDL, LDLCALC, TRIG, CHOLHDL, LDLDIRECT in the last 72 hours. Thyroid Function Tests: No results for input(s): TSH, T4TOTAL, FREET4, T3FREE, THYROIDAB in the last 72 hours. Sepsis Labs: No results for input(s): PROCALCITON, LATICACIDVEN in the last 168 hours.  Recent Results (from the past 240 hour(s))  Resp Panel by RT-PCR (Flu A&B, Covid) Nasopharyngeal Swab     Status: None   Collection Time: 08/05/21 10:46 AM   Specimen: Nasopharyngeal Swab; Nasopharyngeal(NP) swabs in vial transport medium  Result Value Ref Range Status   SARS Coronavirus 2 by RT PCR NEGATIVE NEGATIVE Final    Comment: (NOTE) SARS-CoV-2 target nucleic acids are NOT DETECTED.  The SARS-CoV-2 RNA is generally detectable in upper respiratory specimens during the acute phase of infection. The lowest concentration of SARS-CoV-2 viral copies this assay can detect is 138 copies/mL. A negative result does not preclude SARS-Cov-2 infection and should not be used as the sole basis for treatment or other patient management decisions. A negative result may occur with  improper specimen collection/handling, submission  of  specimen other than nasopharyngeal swab, presence of viral mutation(s) within the areas targeted by this assay, and inadequate number of viral copies(<138 copies/mL). A negative result must be combined with clinical observations, patient history, and epidemiological information. The expected result is Negative.  Fact Sheet for Patients:  EntrepreneurPulse.com.au  Fact Sheet for Healthcare Providers:  IncredibleEmployment.be  This test is no t yet approved or cleared by the [place] FDA and  has been authorized for detection and/or diagnosis of SARS-CoV-2 by FDA under an Emergency Use Authorization (EUA). This EUA will remain  in effect (meaning this test can be used) for the duration of the COVID-19 declaration under Section 564(b)(1) of the Act, 21 U.S.C.section 360bbb-3(b)(1), unless the authorization is terminated  or revoked sooner.       Influenza A by PCR NEGATIVE NEGATIVE Final   Influenza B by PCR NEGATIVE NEGATIVE Final    Comment: (NOTE) The Xpert Xpress SARS-CoV-2/FLU/RSV plus assay is intended as an aid in the diagnosis of influenza from Nasopharyngeal swab specimens and should not be used as a sole basis for treatment. Nasal washings and aspirates are unacceptable for Xpert Xpress SARS-CoV-2/FLU/RSV testing.  Fact Sheet for Patients: EntrepreneurPulse.com.au  Fact Sheet for Healthcare Providers: IncredibleEmployment.be  This test is not yet approved or cleared by the [place] FDA and has been authorized for detection and/or diagnosis of SARS-CoV-2 by FDA under an Emergency Use Authorization (EUA). This EUA will remain in effect (meaning this test can be used) for the duration of the COVID-19 declaration under Section 564(b)(1) of the Act, 21 U.S.C. section 360bbb-3(b)(1), unless the authorization is terminated or revoked.  Performed at [facility], [place] [address]  [place], [place] [address]   Blood Culture (routine x 2)     Status: Abnormal   Collection Time: 08/05/21 10:46 AM   Specimen: BLOOD  Result Value Ref Range Status   Specimen Description BLOOD BLOOD RIGHT WRIST  Final   Special Requests   Final    BOTTLES DRAWN AEROBIC AND ANAEROBIC Blood Culture adequate volume   Culture  Setup Time   Final    GRAM NEGATIVE RODS IN BOTH AEROBIC AND ANAEROBIC BOTTLES CRITICAL VALUE NOTED.  VALUE IS CONSISTENT WITH PREVIOUSLY REPORTED AND CALLED VALUE.    Culture (A)  Final    ESCHERICHIA COLI SUSCEPTIBILITIES PERFORMED ON PREVIOUS CULTURE WITHIN THE LAST 5 DAYS. Performed at [facility], [place] [address] [place], [place] [address]    Report Status 08/08/2021 FINAL  Final  Blood Culture (routine x 2)     Status: Abnormal   Collection Time: 08/05/21 10:51 AM   Specimen: BLOOD  Result Value Ref Range Status   Specimen Description BLOOD BLOOD LEFT WRIST  Final   Special Requests   Final    BOTTLES DRAWN AEROBIC AND ANAEROBIC Blood Culture adequate volume   Culture  Setup Time   Final    GRAM NEGATIVE RODS ANAEROBIC BOTTLE ONLY CRITICAL RESULT CALLED TO, READ BACK BY AND VERIFIED WITH: PHARMD ADRIENNE WELLBORN 08/06/21'@1'$ :41 BY TW IN BOTH AEROBIC AND ANAEROBIC BOTTLES Performed at [facility], [place] [address] [place], [place] [address]    Culture (A)  Final    ESCHERICHIA COLI Confirmed Extended Spectrum Beta-Lactamase Producer (ESBL).  In bloodstream infections from ESBL organisms, carbapenems are preferred over piperacillin/tazobactam. They are shown to have a lower risk of mortality.    Report Status 08/08/2021 FINAL  Final   Organism ID, Bacteria ESCHERICHIA COLI  Final  Susceptibility   Escherichia coli - MIC*    AMPICILLIN >=32 RESISTANT Resistant     CEFAZOLIN >=64 RESISTANT Resistant     CEFEPIME >=32 RESISTANT Resistant     CEFTAZIDIME >=64 RESISTANT Resistant     CEFTRIAXONE >=64 RESISTANT Resistant     CIPROFLOXACIN >=4  RESISTANT Resistant     GENTAMICIN <=1 SENSITIVE Sensitive     IMIPENEM <=0.25 SENSITIVE Sensitive     TRIMETH/SULFA >=320 RESISTANT Resistant     AMPICILLIN/SULBACTAM >=32 RESISTANT Resistant     PIP/TAZO 16 SENSITIVE Sensitive     * ESCHERICHIA COLI  Blood Culture ID Panel (Reflexed)     Status: Abnormal   Collection Time: 08/05/21 10:51 AM  Result Value Ref Range Status   Enterococcus faecalis NOT DETECTED NOT DETECTED Final   Enterococcus Faecium NOT DETECTED NOT DETECTED Final   Listeria monocytogenes NOT DETECTED NOT DETECTED Final   Staphylococcus species NOT DETECTED NOT DETECTED Final   Staphylococcus aureus (BCID) NOT DETECTED NOT DETECTED Final   Staphylococcus epidermidis NOT DETECTED NOT DETECTED Final   Staphylococcus lugdunensis NOT DETECTED NOT DETECTED Final   Streptococcus species NOT DETECTED NOT DETECTED Final   Streptococcus agalactiae NOT DETECTED NOT DETECTED Final   Streptococcus pneumoniae NOT DETECTED NOT DETECTED Final   Streptococcus pyogenes NOT DETECTED NOT DETECTED Final   A.calcoaceticus-baumannii NOT DETECTED NOT DETECTED Final   Bacteroides fragilis NOT DETECTED NOT DETECTED Final   Enterobacterales DETECTED (A) NOT DETECTED Final    Comment: Enterobacterales represent a large order of gram negative bacteria, not a single organism. CRITICAL RESULT CALLED TO, READ BACK BY AND VERIFIED WITH: PHARMD ADRIENNE WELLBORN 08/06/21'@1'$ :41 BY TW    Enterobacter cloacae complex NOT DETECTED NOT DETECTED Final   Escherichia coli DETECTED (A) NOT DETECTED Final    Comment: CRITICAL RESULT CALLED TO, READ BACK BY AND VERIFIED WITH: PHARMD ADRIENNE WELLBORN 08/06/21'@1'$ :41 BY TW    Klebsiella aerogenes NOT DETECTED NOT DETECTED Final   Klebsiella oxytoca NOT DETECTED NOT DETECTED Final   Klebsiella pneumoniae NOT DETECTED NOT DETECTED Final   Proteus species NOT DETECTED NOT DETECTED Final   Salmonella species NOT DETECTED NOT DETECTED Final   Serratia marcescens  NOT DETECTED NOT DETECTED Final   Haemophilus influenzae NOT DETECTED NOT DETECTED Final   Neisseria meningitidis NOT DETECTED NOT DETECTED Final   Pseudomonas aeruginosa NOT DETECTED NOT DETECTED Final   Stenotrophomonas maltophilia NOT DETECTED NOT DETECTED Final   Candida albicans NOT DETECTED NOT DETECTED Final   Candida auris NOT DETECTED NOT DETECTED Final   Candida glabrata NOT DETECTED NOT DETECTED Final   Candida krusei NOT DETECTED NOT DETECTED Final   Candida parapsilosis NOT DETECTED NOT DETECTED Final   Candida tropicalis NOT DETECTED NOT DETECTED Final   Cryptococcus neoformans/gattii NOT DETECTED NOT DETECTED Final   CTX-M ESBL DETECTED (A) NOT DETECTED Final    Comment: CRITICAL RESULT CALLED TO, READ BACK BY AND VERIFIED WITH: PHARMD ADRIENNE WELLBORN 08/06/21'@1'$ :41 BY TW (NOTE) Extended spectrum beta-lactamase detected. Recommend a carbapenem as initial therapy.      Carbapenem resistance IMP NOT DETECTED NOT DETECTED Final   Carbapenem resistance KPC NOT DETECTED NOT DETECTED Final   Carbapenem resistance NDM NOT DETECTED NOT DETECTED Final   Carbapenem resist OXA 48 LIKE NOT DETECTED NOT DETECTED Final   Carbapenem resistance VIM NOT DETECTED NOT DETECTED Final    Comment: Performed at [facility], [place] [address], [place], [place] [address]  MRSA Next Gen by PCR, Nasal  Status: Abnormal   Collection Time: 08/06/21  3:00 AM   Specimen: Nasal Mucosa; Nasal Swab  Result Value Ref Range Status   MRSA by PCR Next Gen DETECTED (A) NOT DETECTED Final    Comment: RESULT CALLED TO, READ BACK BY AND VERIFIED WITH: RN Y. RIANDO 08/06/21'@4'$ :41 BY TW (NOTE) The GeneXpert MRSA Assay (FDA approved for NASAL specimens only), is one component of a comprehensive MRSA colonization surveillance program. It is not intended to diagnose MRSA infection nor to guide or monitor treatment for MRSA infections. Test performance is not FDA approved in patients less than 54  years old. Performed at [facility], [place] [address], [place], [place] [address]     Antimicrobials: Anti-infectives (From admission, onward)    Start     Dose/Rate Route Frequency Ordered Stop   08/07/21 1400  meropenem (MERREM) 1 g in sodium chloride 0.9 % 100 mL IVPB        1 g 200 mL/hr over 30 Minutes Intravenous Every 8 hours 08/07/21 0915 08/14/21 1359   08/06/21 1200  vancomycin (VANCOREADY) IVPB 1500 mg/300 mL  Status:  Discontinued        1,500 mg 150 mL/hr over 120 Minutes Intravenous Every 24 hours 08/05/21 1107 08/07/21 1203   08/06/21 0400  meropenem (MERREM) 1 g in sodium chloride 0.9 % 100 mL IVPB  Status:  Discontinued        1 g 200 mL/hr over 30 Minutes Intravenous Every 12 hours 08/06/21 0241 08/07/21 0915   08/05/21 1932  vancomycin variable dose per unstable renal function (pharmacist dosing)  Status:  Discontinued         Does not apply See admin instructions 08/05/21 1933 08/07/21 0916   08/05/21 1923  ceFEPIme (MAXIPIME) 2 g in sodium chloride 0.9 % 100 mL IVPB  Status:  Discontinued        2 g 200 mL/hr over 30 Minutes Intravenous Every 12 hours 08/05/21 1923 08/06/21 0241   08/05/21 1100  ceFEPIme (MAXIPIME) 2 g in sodium chloride 0.9 % 100 mL IVPB  Status:  Discontinued        2 g 200 mL/hr over 30 Minutes Intravenous Every 8 hours 08/05/21 1056 08/05/21 1923   08/05/21 1100  vancomycin (VANCOREADY) IVPB 1500 mg/300 mL        1,500 mg 150 mL/hr over 120 Minutes Intravenous  Once 08/05/21 1056 08/05/21 1409      Culture/Microbiology    Component Value Date/Time   SDES BLOOD BLOOD LEFT WRIST 08/05/2021 1051   SPECREQUEST  08/05/2021 1051    BOTTLES DRAWN AEROBIC AND ANAEROBIC Blood Culture adequate volume   CULT (A) 08/05/2021 1051    ESCHERICHIA COLI Confirmed Extended Spectrum Beta-Lactamase Producer (ESBL).  In bloodstream infections from ESBL organisms, carbapenems are preferred over piperacillin/tazobactam. They are shown to have a lower  risk of mortality.    REPTSTATUS 08/08/2021 FINAL 08/05/2021 1051  Radiology Studies: No results found.   LOS: 9 days   Antonieta Pert, MD [facility]  08/14/2021, 11:08 AM

## 2021-08-14 NOTE — Telephone Encounter (Signed)
Concerns addressed by Dr. Martinique  I did review chart and spoke to son [name]. Mother has been quite ill with sepsis, recurrent PNA, difficulty swallowing. There does not appear to be any active Cardiac issues. Carries diagnosis of chronic CHF based on EF 45% which goes back years. Does not appear to have any active cardiac issues   [name] MD, [facility]

## 2021-08-15 DIAGNOSIS — A419 Sepsis, unspecified organism: Secondary | ICD-10-CM | POA: Diagnosis not present

## 2021-08-15 DIAGNOSIS — G9340 Encephalopathy, unspecified: Secondary | ICD-10-CM | POA: Diagnosis not present

## 2021-08-15 DIAGNOSIS — R6521 Severe sepsis with septic shock: Secondary | ICD-10-CM | POA: Diagnosis not present

## 2021-08-15 LAB — RENAL FUNCTION PANEL
Albumin: 2.3 g/dL — ABNORMAL LOW (ref 3.5–5.0)
Anion gap: 4 — ABNORMAL LOW (ref 5–15)
BUN: 7 mg/dL — ABNORMAL LOW (ref 8–23)
CO2: 27 mmol/L (ref 22–32)
Calcium: 8.8 mg/dL — ABNORMAL LOW (ref 8.9–10.3)
Chloride: 107 mmol/L (ref 98–111)
Creatinine, Ser: 0.39 mg/dL — ABNORMAL LOW (ref 0.44–1.00)
GFR, Estimated: >60 mL/min (ref >60)
Glucose, Bld: 127 mg/dL — ABNORMAL HIGH (ref 70–99)
Phosphorus: 2.4 mg/dL — ABNORMAL LOW (ref 2.5–4.6)
Potassium: 4.2 mmol/L (ref 3.5–5.1)
Sodium: 138 mmol/L (ref 135–145)

## 2021-08-15 LAB — GLUCOSE, CAPILLARY
Glucose-Capillary: 126 mg/dL — ABNORMAL HIGH (ref 70–99)
Glucose-Capillary: 133 mg/dL — ABNORMAL HIGH (ref 70–99)
Glucose-Capillary: 123 mg/dL — ABNORMAL HIGH (ref 70–99)
Glucose-Capillary: 145 mg/dL — ABNORMAL HIGH (ref 70–99)
Glucose-Capillary: 103 mg/dL — ABNORMAL HIGH (ref 70–99)
Glucose-Capillary: 76 mg/dL (ref 70–99)

## 2021-08-15 NOTE — Plan of Care (Signed)
  Problem: Education: Goal: Knowledge of General Education information will improve Description: Including pain rating scale, medication(s)/side effects and non-pharmacologic comfort measures Outcome: Not Progressing   Problem: Health Behavior/Discharge Planning: Goal: Ability to manage health-related needs will improve Outcome: Not Progressing   Problem: Clinical Measurements: Goal: Ability to maintain clinical measurements within normal limits will improve Outcome: Not Progressing Goal: Will remain free from infection Outcome: Not Progressing Goal: Diagnostic test results will improve Outcome: Not Progressing Goal: Respiratory complications will improve Outcome: Not Progressing Goal: Cardiovascular complication will be avoided Outcome: Not Progressing   Problem: Nutrition: Goal: Adequate nutrition will be maintained Outcome: Not Progressing   Problem: Coping: Goal: Level of anxiety will decrease Outcome: Not Progressing   Problem: Elimination: Goal: Will not experience complications related to bowel motility Outcome: Not Progressing Goal: Will not experience complications related to urinary retention Outcome: Not Progressing   

## 2021-08-15 NOTE — Progress Notes (Signed)
PROGRESS NOTE [name] [name]  VCB:[id number] DOB: 01-Jan-1961 DOA: 08/05/2021 PCP: Leeroy Cha, MD   Brief Narrative/Hospital Course: 61 y.o. fw/ history significant of failure to thrive, dysphagia secondary to moderate esophageal dysmotility (barium swallow March 2023), psoriasis arthritis, CAD s/p CABG, DM-2, chronic systolic CHF LVEF 38%, was sent to hospital for evaluation of altered mentations hypoxia and fever. Recently hospitalized and discharged to nursing home 11 days PTA after treatment of supposed empyema and pneumonia. Patient has established history of failure to thrive and has been losing weight, and according to the record she lost about 13 kg in last 59-month.  Per report she was discharged on soft diet, last week, her diet was downgraded to pured. Despite, family observed several times patient had choking episode after eating for food. In ED- hypoxic initially briefly on 15 L of nonrebreather and titrated down to 5 L, tachycardia hypotensive but responded to IV fluid.  Febrile 101.4 CTAPE-negative for PE but patchy infiltrates on bilateral lower lung fields suspicious for pneumonia.Small right-sided pleural effusion. Patient is admitted for severe sepsis secondary to aspiration pneumonia and Bacteremia with ESBL E Coli -being managed with  Meropenem-total course 7 days, placed on Cortrak for feeding. Seen by GI who felt nothing further to add in terms of management- and if agreeable consider PEG. Seen by palliative care and have had multiple conversations with patient and family.IR evaluation placed for PEG tube placement if family agreeable for failure to thrive/dysphagia/severe protein-calorie malnutrition.. Once PEG tube is placed, core track removed can be discharged to SNF.  AKI hyponatremia hyperkalemia has resolved.  Also had hypokalemia hypophosphatemia metabolic encephalopathy.    Subjective: Seen and examined this morning.  Patient is pleasant.  No new complaints.   Waiting for PEG tube placement this is available.  Seen examined this am Resting on bed, pleasant and conversant on 2l [place] Awaiting on  PEG  Assessment and Plan: Principal Problem:   Sepsis ([facility] Active Problems:   Rheumatoid arthritis ([facility]   Gastroesophageal reflux disease without esophagitis   AMS (altered mental status)   Hypokalemia   Hypernatremia   Pressure injury of skin   Malnutrition of moderate degree   AKI (acute kidney injury) ([facility]   Adult failure to thrive syndrome   Dysphagia   Chronic systolic CHF (congestive heart failure) (HCC)   Infection due to ESBL-producing Escherichia coli   Hypophosphatemia   Folate deficiency  Severe sepsis due to ESBL E. coli bacteremia Aspiration pneumonia Possible UTI Acute respiratory with hypoxia/lactic acidosis: Blood culture ESBL E. coli seen by ID. Completed 7 days meropenem  08/14/21 Sepsis physiology resolved.  Hemodynamically stable.  Failure to thrive Dysphagia-esophageal dysmotility Severe protein calorie malnutrition POA: Patient esophageal dysmotility noted to have had swallowing problems, seen by speech back in March with inadequate nutrition severe hypoalbuminemia.  Getting tube feed with core track, dietitian following, PMT following, seen by GI-EGD would not improve dysmotility or decrease of aspiration pneumonia and will not change her discharge.IR consulted for PEG tube placement.  CT scan anatomy amenable for PEG tube per IR.IR advised to hold aspirin x5 days.  Patient is still agreeable for PEG tube  Acute metabolic encephalopathy in the setting of sepsis: Mental status stable.  Conversant.  Following commands.    AKI: Creatinine improved to 0.5 in the setting of sepsis dehydration volume contraction  Hyperkalemia resolved Hypokalemia/hypophosphatemia: Replete and monitor.  Hypertension Chronic systolic CHF: BP remains well controlled continue home Lopressor home regimen, monitor fluid status, CHF is  compensated. GERD continue PPI  Anemia/folate deficiency without overt bleeding monitor H&H transfuse for less than 7 g.Anemia panel with iron level of 19, folate of 4.0, ferritin at 1850, TIBC not calculated.  Vitamin B12 levels of 421  Pseudohypocalcemia correctedcalcium stable  Psoriasis arthritis: On outpatient follow-up with rheumatology Pressure injury POA Pressure Injury 08/05/21 Buttocks Right Stage 2 -  Partial thickness loss of dermis presenting as a shallow open injury with a red, pink wound bed without slough. red, no drainage (Active)  08/05/21 1835  Location: Buttocks  Location Orientation: Right  Staging: Stage 2 -  Partial thickness loss of dermis presenting as a shallow open injury with a red, pink wound bed without slough.  Wound Description (Comments): red, no drainage  Present on Admission: Yes     Pressure Injury 08/05/21 Hip Right Stage 2 -  Partial thickness loss of dermis presenting as a shallow open injury with a red, pink wound bed without slough. (Active)  08/05/21 1837  Location: Hip  Location Orientation: Right  Staging: Stage 2 -  Partial thickness loss of dermis presenting as a shallow open injury with a red, pink wound bed without slough.  Wound Description (Comments):   Present on Admission: Yes      DVT prophylaxis: heparin injection 5,000 Units Start: 08/05/21 1545 Code Status:   Code Status: Full Code Family Communication: plan of care discussed with patient/ no family at bedside.  Eyes discussed with palliative care and they have updated family at bedside 2 days ago. Patient status is: Inpatient because of ongoing need for further procedure of PEG tube Level of care: Telemetry Medical   Dispo: The patient is from: [place]            Anticipated disposition: Skilled nursing facility once tolerating tube feeds after PEG placement and core track is removed  Mobility Assessment (last 72 hours)     Mobility Assessment      Row Name 08/15/21 0745 08/15/21 0400 08/14/21 2000 08/14/21 1300 08/14/21 0900   Does patient have an order for bedrest or is patient medically unstable Yes- Bedfast (Level 1) - Complete Yes- Bedfast (Level 1) - Complete Yes- Bedfast (Level 1) - Complete -- Yes- Bedfast (Level 1) - Complete   What is the highest level of mobility based on the progressive mobility assessment? Level 1 (Bedfast) - Unable to balance while sitting on edge of bed Level 1 (Bedfast) - Unable to balance while sitting on edge of bed Level 1 (Bedfast) - Unable to balance while sitting on edge of bed Level 1 (Bedfast) - Unable to balance while sitting on edge of bed Level 1 (Bedfast) - Unable to balance while sitting on edge of bed   Is the above level different from baseline mobility prior to current illness? Yes - Recommend PT order Yes - Recommend PT order Yes - Recommend PT order -- Yes - Recommend PT order    [place] Name 08/13/21 1323 08/13/21 1300 08/13/21 0900 08/12/21 2100     Does patient have an order for bedrest or is patient medically unstable -- -- Yes- Bedfast (Level 1) - Complete Yes- Bedfast (Level 1) - Complete    What is the highest level of mobility based on the progressive mobility assessment? Level 1 (Bedfast) - Unable to balance while sitting on edge of bed Level 1 (Bedfast) - Unable to balance while sitting on edge of bed Level 1 (Bedfast) - Unable to balance while sitting on edge of bed  Level 1 (Bedfast) - Unable to balance while sitting on edge of bed    Is the above level different from baseline mobility prior to current illness? -- -- Yes - Recommend PT order Yes - Recommend PT order              Objective: Vitals last 24 hrs: Vitals:   08/14/21 2022 08/15/21 0459 08/15/21 0500 08/15/21 0803  BP: 137/78 134/80  128/73  Pulse: 97 84  85  Resp: (!) 23 (!) 21  18  Temp: 98.4 F (36.9 C) 97.7 F (36.5 C)  98.6 F (37 C)  TempSrc: Oral Oral  Oral  SpO2: 98% 97%  98%  Weight:   78.5 kg     Weight change:   Physical Examination: General exam: AA, pleasant older than stated age, weak appearing. On 2l [place] HEENT:Oral mucosa moist, Ear/Nose WNL grossly, dentition normal. Respiratory system: bilaterally diminished, no use of accessory muscle Cardiovascular system: S1 & S2 +, No JVD,. Gastrointestinal system: Abdomen soft,NT,ND,BS+. Cortrak+ Nervous System:Alert, awake, moving extremities and grossly nonfocal Extremities: LE ankle edema none, distal peripheral pulses palpable.  Skin: No rashes,no icterus. MSK: Normal muscle bulk,tone, power   Medications reviewed:  Scheduled Meds:  atorvastatin  80 mg Per Tube Daily   escitalopram  10 mg Per Tube QHS   feeding supplement (PROSource TF)  45 mL Per Tube BID   folic acid  1 mg Per Tube Daily   free water  175 mL Per Tube Q4H   gabapentin  100 mg Per Tube Q12H   heparin  5,000 Units Subcutaneous Q8H   liver oil-zinc oxide   Topical QID   metoprolol tartrate  25 mg Per Tube BID   pantoprazole sodium  40 mg Per Tube Daily   phosphorus  250 mg Per Tube Daily   Continuous Infusions:  feeding supplement (JEVITY 1.5 CAL/FIBER) 1,000 mL (08/14/21 1806)    Pressure Injury 05/01/21 Sacrum Mid Stage 1 -  Intact skin with non-blanchable redness of a localized area usually over a bony prominence. (Active)  05/01/21 0400  Location: Sacrum  Location Orientation: Mid  Staging: Stage 1 -  Intact skin with non-blanchable redness of a localized area usually over a bony prominence.  Wound Description (Comments):   Present on Admission: Yes     Pressure Injury 05/08/21 Buttocks Left;Right Stage 2 -  Partial thickness loss of dermis presenting as a shallow open injury with a red, pink wound bed without slough. (Active)  05/08/21 1951  Location: Buttocks  Location Orientation: Left;Right  Staging: Stage 2 -  Partial thickness loss of dermis presenting as a shallow open injury with a red, pink wound bed without slough.  Wound Description  (Comments):   Present on Admission:      Pressure Injury 08/05/21 Buttocks Right Stage 2 -  Partial thickness loss of dermis presenting as a shallow open injury with a red, pink wound bed without slough. red, no drainage (Active)  08/05/21 1835  Location: Buttocks  Location Orientation: Right  Staging: Stage 2 -  Partial thickness loss of dermis presenting as a shallow open injury with a red, pink wound bed without slough.  Wound Description (Comments): red, no drainage  Present on Admission: Yes  Dressing Type Foam - Lift dressing to assess site every shift 08/15/21 0745     Pressure Injury 08/05/21 Hip Right Stage 2 -  Partial thickness loss of dermis presenting as a shallow open injury with a red, pink wound bed  without slough. (Active)  08/05/21 1837  Location: Hip  Location Orientation: Right  Staging: Stage 2 -  Partial thickness loss of dermis presenting as a shallow open injury with a red, pink wound bed without slough.  Wound Description (Comments):   Present on Admission: Yes  Dressing Type Foam - Lift dressing to assess site every shift 08/15/21 0745   Diet Order             Diet NPO time specified Except for: Ice Chips  Diet effective now                    Nutrition Problem: Moderate Malnutrition Etiology: chronic illness (dysphagia) Signs/Symptoms: mild muscle depletion, moderate muscle depletion, mild fat depletion, moderate fat depletion Interventions: Refer to RD note for recommendations   Intake/Output Summary (Last 24 hours) at 08/15/2021 1010 Last data filed at 08/14/2021 1734 Gross per 24 hour  Intake --  Output 700 ml  Net -700 ml    Net IO Since Admission: 12,977.91 mL [08/15/21 1010]  Wt Readings from Last 3 Encounters:  08/15/21 78.5 kg  07/30/21 68 kg  07/29/21 68.6 kg     Unresulted Labs (From admission, onward)     Start     Ordered   08/08/21 0500  Renal function panel  Daily,   R     Question:  Specimen collection method  Answer:   Lab=Lab collect   08/07/21 1745          Data Reviewed: I have personally reviewed following labs and imaging studies CBC: Recent Labs  Lab 08/09/21 0638 08/10/21 0236  WBC 5.6 6.0  HGB 11.6* 10.0*  HCT 36.4 30.9*  MCV 94.5 93.1  PLT 124* 120*    Basic Metabolic Panel: Recent Labs  Lab 08/08/21 1515 08/08/21 1515 08/09/21 2774 08/10/21 0236 08/11/21 0252 08/12/21 0229 08/13/21 0839 08/14/21 0407 08/15/21 0330  NA  --    < > 139 134* 141 140 141 140 138  K  --    < > 4.0 4.1 4.3 4.5 4.3 4.2 4.2  CL  --    < > 112* 107 113* 111 107 105 107  CO2  --    < > '22 22 25 25 28 29 27  '$ GLUCOSE  --    < > 233* 168* 149* 125* 134* 126* 127*  BUN  --    < > 8 7* 6* 6* 6* 6* 7*  CREATININE  --    < > 0.51 0.44 0.46 0.42* 0.42* 0.54 0.39*  CALCIUM  --    < > 7.5* 6.9* 7.3* 7.9* 8.4* 8.7* 8.8*  MG 1.8  --  1.7 2.0  --  2.0 1.8  --   --   PHOS 2.6  --  1.3*  1.3* 1.2* 2.0* 1.5* 3.5 2.1* 2.4*   < > = values in this interval not displayed.    GFR: Estimated Creatinine Clearance: 77.1 mL/min (A) (by C-G formula based on SCr of 0.39 mg/dL (L)). Liver Function Tests: Recent Labs  Lab 08/11/21 0252 08/12/21 0229 08/13/21 0839 08/14/21 0407 08/15/21 0330  ALBUMIN <1.5* 2.1* 2.4* 2.6* 2.3*    No results for input(s): LIPASE, AMYLASE in the last 168 hours. No results for input(s): AMMONIA in the last 168 hours. Coagulation Profile: No results for input(s): INR, PROTIME in the last 168 hours. BNP (last 3 results) No results for input(s): PROBNP in the last 8760 hours. HbA1C: No results for input(s): HGBA1C  in the last 72 hours. CBG: Recent Labs  Lab 08/14/21 1616 08/14/21 2023 08/15/21 0248 08/15/21 0502 08/15/21 0800  GLUCAP 117* 111* 126* 133* 123*    Lipid Profile: No results for input(s): CHOL, HDL, LDLCALC, TRIG, CHOLHDL, LDLDIRECT in the last 72 hours. Thyroid Function Tests: No results for input(s): TSH, T4TOTAL, FREET4, T3FREE, THYROIDAB in the last 72  hours. Sepsis Labs: No results for input(s): PROCALCITON, LATICACIDVEN in the last 168 hours.  Recent Results (from the past 240 hour(s))  Resp Panel by RT-PCR (Flu A&B, Covid) Nasopharyngeal Swab     Status: None   Collection Time: 08/05/21 10:46 AM   Specimen: Nasopharyngeal Swab; Nasopharyngeal(NP) swabs in vial transport medium  Result Value Ref Range Status   SARS Coronavirus 2 by RT PCR NEGATIVE NEGATIVE Final    Comment: (NOTE) SARS-CoV-2 target nucleic acids are NOT DETECTED.  The SARS-CoV-2 RNA is generally detectable in upper respiratory specimens during the acute phase of infection. The lowest concentration of SARS-CoV-2 viral copies this assay can detect is 138 copies/mL. A negative result does not preclude SARS-Cov-2 infection and should not be used as the sole basis for treatment or other patient management decisions. A negative result may occur with  improper specimen collection/handling, submission of specimen other than nasopharyngeal swab, presence of viral mutation(s) within the areas targeted by this assay, and inadequate number of viral copies(<138 copies/mL). A negative result must be combined with clinical observations, patient history, and epidemiological information. The expected result is Negative.  Fact Sheet for Patients:  EntrepreneurPulse.com.au  Fact Sheet for Healthcare Providers:  IncredibleEmployment.be  This test is no t yet approved or cleared by the [place] FDA and  has been authorized for detection and/or diagnosis of SARS-CoV-2 by FDA under an Emergency Use Authorization (EUA). This EUA will remain  in effect (meaning this test can be used) for the duration of the COVID-19 declaration under Section 564(b)(1) of the Act, 21 U.S.C.section 360bbb-3(b)(1), unless the authorization is terminated  or revoked sooner.       Influenza A by PCR NEGATIVE NEGATIVE Final   Influenza B by PCR NEGATIVE  NEGATIVE Final    Comment: (NOTE) The Xpert Xpress SARS-CoV-2/FLU/RSV plus assay is intended as an aid in the diagnosis of influenza from Nasopharyngeal swab specimens and should not be used as a sole basis for treatment. Nasal washings and aspirates are unacceptable for Xpert Xpress SARS-CoV-2/FLU/RSV testing.  Fact Sheet for Patients: EntrepreneurPulse.com.au  Fact Sheet for Healthcare Providers: IncredibleEmployment.be  This test is not yet approved or cleared by the [place] FDA and has been authorized for detection and/or diagnosis of SARS-CoV-2 by FDA under an Emergency Use Authorization (EUA). This EUA will remain in effect (meaning this test can be used) for the duration of the COVID-19 declaration under Section 564(b)(1) of the Act, 21 U.S.C. section 360bbb-3(b)(1), unless the authorization is terminated or revoked.  Performed at [facility], [place] [address], [place], [place] [address]   Blood Culture (routine x 2)     Status: Abnormal   Collection Time: 08/05/21 10:46 AM   Specimen: BLOOD  Result Value Ref Range Status   Specimen Description BLOOD BLOOD RIGHT WRIST  Final   Special Requests   Final    BOTTLES DRAWN AEROBIC AND ANAEROBIC Blood Culture adequate volume   Culture  Setup Time   Final    GRAM NEGATIVE RODS IN BOTH AEROBIC AND ANAEROBIC BOTTLES CRITICAL VALUE NOTED.  VALUE IS CONSISTENT WITH PREVIOUSLY REPORTED  AND CALLED VALUE.    Culture (A)  Final    ESCHERICHIA COLI SUSCEPTIBILITIES PERFORMED ON PREVIOUS CULTURE WITHIN THE LAST 5 DAYS. Performed at [facility], [place] [address], [place], [place] [address]    Report Status 08/08/2021 FINAL  Final  Blood Culture (routine x 2)     Status: Abnormal   Collection Time: 08/05/21 10:51 AM   Specimen: BLOOD  Result Value Ref Range Status   Specimen Description BLOOD BLOOD LEFT WRIST  Final   Special Requests   Final    BOTTLES DRAWN AEROBIC AND  ANAEROBIC Blood Culture adequate volume   Culture  Setup Time   Final    GRAM NEGATIVE RODS ANAEROBIC BOTTLE ONLY CRITICAL RESULT CALLED TO, READ BACK BY AND VERIFIED WITH: PHARMD ADRIENNE WELLBORN 08/06/21'@1'$ :41 BY TW IN BOTH AEROBIC AND ANAEROBIC BOTTLES Performed at [facility], [place] [address], [place], [place] [address]    Culture (A)  Final    ESCHERICHIA COLI Confirmed Extended Spectrum Beta-Lactamase Producer (ESBL).  In bloodstream infections from ESBL organisms, carbapenems are preferred over piperacillin/tazobactam. They are shown to have a lower risk of mortality.    Report Status 08/08/2021 FINAL  Final   Organism ID, Bacteria ESCHERICHIA COLI  Final      Susceptibility   Escherichia coli - MIC*    AMPICILLIN >=32 RESISTANT Resistant     CEFAZOLIN >=64 RESISTANT Resistant     CEFEPIME >=32 RESISTANT Resistant     CEFTAZIDIME >=64 RESISTANT Resistant     CEFTRIAXONE >=64 RESISTANT Resistant     CIPROFLOXACIN >=4 RESISTANT Resistant     GENTAMICIN <=1 SENSITIVE Sensitive     IMIPENEM <=0.25 SENSITIVE Sensitive     TRIMETH/SULFA >=320 RESISTANT Resistant     AMPICILLIN/SULBACTAM >=32 RESISTANT Resistant     PIP/TAZO 16 SENSITIVE Sensitive     * ESCHERICHIA COLI  Blood Culture ID Panel (Reflexed)     Status: Abnormal   Collection Time: 08/05/21 10:51 AM  Result Value Ref Range Status   Enterococcus faecalis NOT DETECTED NOT DETECTED Final   Enterococcus Faecium NOT DETECTED NOT DETECTED Final   Listeria monocytogenes NOT DETECTED NOT DETECTED Final   Staphylococcus species NOT DETECTED NOT DETECTED Final   Staphylococcus aureus (BCID) NOT DETECTED NOT DETECTED Final   Staphylococcus epidermidis NOT DETECTED NOT DETECTED Final   Staphylococcus lugdunensis NOT DETECTED NOT DETECTED Final   Streptococcus species NOT DETECTED NOT DETECTED Final   Streptococcus agalactiae NOT DETECTED NOT DETECTED Final   Streptococcus pneumoniae NOT DETECTED NOT DETECTED Final    Streptococcus pyogenes NOT DETECTED NOT DETECTED Final   A.calcoaceticus-baumannii NOT DETECTED NOT DETECTED Final   Bacteroides fragilis NOT DETECTED NOT DETECTED Final   Enterobacterales DETECTED (A) NOT DETECTED Final    Comment: Enterobacterales represent a large order of gram negative bacteria, not a single organism. CRITICAL RESULT CALLED TO, READ BACK BY AND VERIFIED WITH: PHARMD ADRIENNE WELLBORN 08/06/21'@1'$ :41 BY TW    Enterobacter cloacae complex NOT DETECTED NOT DETECTED Final   Escherichia coli DETECTED (A) NOT DETECTED Final    Comment: CRITICAL RESULT CALLED TO, READ BACK BY AND VERIFIED WITH: PHARMD ADRIENNE WELLBORN 08/06/21'@1'$ :41 BY TW    Klebsiella aerogenes NOT DETECTED NOT DETECTED Final   Klebsiella oxytoca NOT DETECTED NOT DETECTED Final   Klebsiella pneumoniae NOT DETECTED NOT DETECTED Final   Proteus species NOT DETECTED NOT DETECTED Final   Salmonella species NOT DETECTED NOT DETECTED Final   Serratia marcescens NOT DETECTED NOT  DETECTED Final   Haemophilus influenzae NOT DETECTED NOT DETECTED Final   Neisseria meningitidis NOT DETECTED NOT DETECTED Final   Pseudomonas aeruginosa NOT DETECTED NOT DETECTED Final   Stenotrophomonas maltophilia NOT DETECTED NOT DETECTED Final   Candida albicans NOT DETECTED NOT DETECTED Final   Candida auris NOT DETECTED NOT DETECTED Final   Candida glabrata NOT DETECTED NOT DETECTED Final   Candida krusei NOT DETECTED NOT DETECTED Final   Candida parapsilosis NOT DETECTED NOT DETECTED Final   Candida tropicalis NOT DETECTED NOT DETECTED Final   Cryptococcus neoformans/gattii NOT DETECTED NOT DETECTED Final   CTX-M ESBL DETECTED (A) NOT DETECTED Final    Comment: CRITICAL RESULT CALLED TO, READ BACK BY AND VERIFIED WITH: PHARMD ADRIENNE WELLBORN 08/06/21'@1'$ :41 BY TW (NOTE) Extended spectrum beta-lactamase detected. Recommend a carbapenem as initial therapy.      Carbapenem resistance IMP NOT DETECTED NOT DETECTED Final    Carbapenem resistance KPC NOT DETECTED NOT DETECTED Final   Carbapenem resistance NDM NOT DETECTED NOT DETECTED Final   Carbapenem resist OXA 48 LIKE NOT DETECTED NOT DETECTED Final   Carbapenem resistance VIM NOT DETECTED NOT DETECTED Final    Comment: Performed at [facility], [place] [address] [place], [place] [address]  MRSA Next Gen by PCR, Nasal     Status: Abnormal   Collection Time: 08/06/21  3:00 AM   Specimen: Nasal Mucosa; Nasal Swab  Result Value Ref Range Status   MRSA by PCR Next Gen DETECTED (A) NOT DETECTED Final    Comment: RESULT CALLED TO, READ BACK BY AND VERIFIED WITH: RN Heloise Ochoa 08/06/21'@4'$ :41 BY TW (NOTE) The GeneXpert MRSA Assay (FDA approved for NASAL specimens only), is one component of a comprehensive MRSA colonization surveillance program. It is not intended to diagnose MRSA infection nor to guide or monitor treatment for MRSA infections. Test performance is not FDA approved in patients less than 48 years old. Performed at [facility], [place] [address] [place], [place] [address]      Antimicrobials: Anti-infectives (From admission, onward)    Start     Dose/Rate Route Frequency Ordered Stop   08/07/21 1400  meropenem (MERREM) 1 g in sodium chloride 0.9 % 100 mL IVPB        1 g 200 mL/hr over 30 Minutes Intravenous Every 8 hours 08/07/21 0915 08/14/21 1359   08/06/21 1200  vancomycin (VANCOREADY) IVPB 1500 mg/300 mL  Status:  Discontinued        1,500 mg 150 mL/hr over 120 Minutes Intravenous Every 24 hours 08/05/21 1107 08/07/21 1203   08/06/21 0400  meropenem (MERREM) 1 g in sodium chloride 0.9 % 100 mL IVPB  Status:  Discontinued        1 g 200 mL/hr over 30 Minutes Intravenous Every 12 hours 08/06/21 0241 08/07/21 0915   08/05/21 1932  vancomycin variable dose per unstable renal function (pharmacist dosing)  Status:  Discontinued         Does not apply See admin instructions 08/05/21 1933 08/07/21 0916   08/05/21 1923  ceFEPIme  (MAXIPIME) 2 g in sodium chloride 0.9 % 100 mL IVPB  Status:  Discontinued        2 g 200 mL/hr over 30 Minutes Intravenous Every 12 hours 08/05/21 1923 08/06/21 0241   08/05/21 1100  ceFEPIme (MAXIPIME) 2 g in sodium chloride 0.9 % 100 mL IVPB  Status:  Discontinued        2 g 200 mL/hr over 30 Minutes  Intravenous Every 8 hours 08/05/21 1056 08/05/21 1923   08/05/21 1100  vancomycin (VANCOREADY) IVPB 1500 mg/300 mL        1,500 mg 150 mL/hr over 120 Minutes Intravenous  Once 08/05/21 1056 08/05/21 1409      Culture/Microbiology    Component Value Date/Time   SDES BLOOD BLOOD LEFT WRIST 08/05/2021 1051   SPECREQUEST  08/05/2021 1051    BOTTLES DRAWN AEROBIC AND ANAEROBIC Blood Culture adequate volume   CULT (A) 08/05/2021 1051    ESCHERICHIA COLI Confirmed Extended Spectrum Beta-Lactamase Producer (ESBL).  In bloodstream infections from ESBL organisms, carbapenems are preferred over piperacillin/tazobactam. They are shown to have a lower risk of mortality.    REPTSTATUS 08/08/2021 FINAL 08/05/2021 1051  Radiology Studies: CT ABDOMEN WO CONTRAST  Result Date: 08/14/2021 CLINICAL DATA:  Altered mental status, sepsis, dysphagia and evaluation for possible gastrostomy tube placement for nutrition. EXAM: CT ABDOMEN WITHOUT CONTRAST TECHNIQUE: Multidetector CT imaging of the abdomen was performed following the standard protocol without IV contrast. RADIATION DOSE REDUCTION: This exam was performed according to the departmental dose-optimization program which includes automated exposure control, adjustment of the mA and/or kV according to patient size and/or use of iterative reconstruction technique. COMPARISON:  CT a of the chest on 08/05/2021 FINDINGS: Lower chest: Stable small right pleural effusion with associated right basilar atelectasis. Mild atelectasis at the posterior left lung base. Hepatobiliary: Unenhanced appearance of the liver is unremarkable. The gallbladder is contracted.  Pancreas: Unremarkable. No pancreatic ductal dilatation or surrounding inflammatory changes. Spleen: Normal in size without focal abnormality. Adrenals/Urinary Tract: Adrenal glands are unremarkable. Kidneys are normal, without renal calculi, focal lesion, or hydronephrosis. Stomach/Bowel: The tube extends into the proximal stomach just below the GE junction. Bowel shows no evidence of obstruction, ileus or inflammation. No free air identified. No hiatal hernia. The stomach is normally positioned with predominant horizontal lie. The transverse colon is well below the stomach and there is no interposition of bowel between the stomach and abdominal wall. Left lobe of the liver does cross the proximal stomach. Vascular/Lymphatic: Aortic atherosclerosis without aneurysm. No enlarged abdominal lymph nodes. Other: No abdominal wall hernia. No ascites or abnormal fluid collections visualized. Musculoskeletal: No acute or significant osseous findings. IMPRESSION: 1. Normal positioning of stomach with no anatomic contraindication to potential attempted percutaneous gastrostomy tube placement. 2. Current feeding tube extends barely below the GE junction into the proximal stomach. 3. Stable small right pleural effusion with associated right basilar atelectasis. 4. Atherosclerosis of the abdominal aorta without aneurysm. Electronically Signed   By: Aletta Edouard M.D.   On: 08/14/2021 11:16     LOS: 10 days   Antonieta Pert, MD [facility]  08/15/2021, 10:10 AM

## 2021-08-15 NOTE — Plan of Care (Signed)

## 2021-08-16 ENCOUNTER — Encounter (HOSPITAL_COMMUNITY): Payer: Self-pay | Admitting: Internal Medicine

## 2021-08-16 ENCOUNTER — Other Ambulatory Visit: Payer: Self-pay

## 2021-08-16 DIAGNOSIS — R6521 Severe sepsis with septic shock: Secondary | ICD-10-CM | POA: Diagnosis not present

## 2021-08-16 DIAGNOSIS — A419 Sepsis, unspecified organism: Secondary | ICD-10-CM | POA: Diagnosis not present

## 2021-08-16 DIAGNOSIS — G9340 Encephalopathy, unspecified: Secondary | ICD-10-CM | POA: Diagnosis not present

## 2021-08-16 LAB — RENAL FUNCTION PANEL
Albumin: 2.2 g/dL — ABNORMAL LOW (ref 3.5–5.0)
Anion gap: 6 (ref 5–15)
BUN: 11 mg/dL (ref 8–23)
CO2: 28 mmol/L (ref 22–32)
Calcium: 8.9 mg/dL (ref 8.9–10.3)
Chloride: 104 mmol/L (ref 98–111)
Creatinine, Ser: 0.53 mg/dL (ref 0.44–1.00)
GFR, Estimated: >60 mL/min (ref >60)
Glucose, Bld: 117 mg/dL — ABNORMAL HIGH (ref 70–99)
Phosphorus: 3.2 mg/dL (ref 2.5–4.6)
Potassium: 4 mmol/L (ref 3.5–5.1)
Sodium: 138 mmol/L (ref 135–145)

## 2021-08-16 LAB — GLUCOSE, CAPILLARY
Glucose-Capillary: 124 mg/dL — ABNORMAL HIGH (ref 70–99)
Glucose-Capillary: 140 mg/dL — ABNORMAL HIGH (ref 70–99)

## 2021-08-16 MED ORDER — CEFAZOLIN SODIUM-DEXTROSE 2-4 GM/100ML-% IV SOLN
2.0000 g | INTRAVENOUS | Status: AC
  Administered 2021-08-20: 2 g via INTRAVENOUS
  Filled 2021-08-16: qty 100

## 2021-08-16 NOTE — Progress Notes (Addendum)
Nutrition Consult - Brief Note  Received consult for assessment of tube feeding formula d/t patient's report of lactose intolerance and loose stools. All [facility] formulary tube feeding formulas are appropriate for lactose intolerance. RD changed tube feeding formula to Jevity 1.5 5/31 to increase fiber intake. Hopefully addition of fiber will help improve consistency of stools. No BM documented 6/1. One BM documented this morning, type 7. RD to continue to monitor for TF tolerance.   Lucas Mallow RD, LDN, CNSC Please refer to Amion for contact information.

## 2021-08-16 NOTE — Progress Notes (Signed)
Occupational Therapy Treatment Patient Details Name: [name] [name] MRN: [id number] DOB: 20-May-1960 Today's Date: 08/16/2021   History of present illness [name] [name] is a 61 y.o. female with medical history significant of failure to thrive, dysphagia secondary to moderate esophageal dysmotility (barium swallow March 2023), psoriasis arthritis, CAD status post CABG, DM-2, chronic systolic CHF LVEF 98%, was sent to hospital for evaluation of altered mentations hypoxia and fever.   OT comments  Pt with minimal progress towards goals primarily due to self limiting behaviors. Pt was able to demo initiation to assist with rolling side to side in bed for peri care after bowel incontinence. However, pt adamantly declined EOB/OOB attempts citing various reasons. Attempted to engage in basic ADLs, suction device use, squeeze ball for grip strength and bed level UE theraband exercises with poor participation noted in all aspects. Reinforced recommendations for SNF rehab and therapy expectations of participation to progress functional skills.    Recommendations for follow up therapy are one component of a multi-disciplinary discharge planning process, led by the attending physician.  Recommendations may be updated based on patient status, additional functional criteria and insurance authorization.    Follow Up Recommendations  Skilled nursing-short term rehab (<3 hours/day) (pending improved participation with therapy at acute level)    Assistance Recommended at Discharge Frequent or constant Supervision/Assistance  Patient can return home with the following  A lot of help with walking and/or transfers;A lot of help with bathing/dressing/bathroom;Direct supervision/assist for medications management;Direct supervision/assist for financial management   Equipment Recommendations  Wheelchair (measurements OT);Wheelchair cushion (measurements OT);Hospital bed    Recommendations for Other Services       Precautions / Restrictions Precautions Precautions: Fall;Other (comment) Precaution Comments: cortrak Restrictions Weight Bearing Restrictions: No       Mobility Bed Mobility Overal bed mobility: Needs Assistance Bed Mobility: Rolling Rolling: Max assist         General bed mobility comments: Able to initiate reaching to bedrail and lifting LE to pull self for rolling side to side for peri care. declined to sit EOB    Transfers                   General transfer comment: declined to attempt     Balance                                           ADL either performed or assessed with clinical judgement   ADL Overall ADL's : Needs assistance/impaired                             Toileting- Clothing Manipulation and Hygiene: Total assistance;Bed level Toileting - Clothing Manipulation Details (indicate cue type and reason): NT present assisting with peri care after bowel incontinence       General ADL Comments: Very self limiting. did initiate to assist with rolling for peri care. Afterwards, attempted to engage in basic grooming tasks, suction device manipulating, squeeze ball for grip strength and light theraband exercises with pt reporting she cannot do these tasks though will not attempt when encouragement provided.    Extremity/Trunk Assessment Upper Extremity Assessment Upper Extremity Assessment: RUE deficits/detail;LUE deficits/detail RUE Deficits / Details: R elbow with impaired extension, unable to FF >30 degrees in shoulder RUE Coordination: decreased fine motor LUE Deficits / Details: extension impaired in L  elbow but able to FF WNL in shoulder LUE Coordination: decreased fine motor   Lower Extremity Assessment Lower Extremity Assessment: Defer to PT evaluation        Vision   Vision Assessment?: No apparent visual deficits   Perception     Praxis      Cognition Arousal/Alertness: Awake/alert Behavior During  Therapy: Flat affect Overall Cognitive Status: No family/caregiver present to determine baseline cognitive functioning Area of Impairment: Following commands, Awareness, Problem solving, Safety/judgement, Memory                     Memory: Decreased short-term memory Following Commands: Follows one step commands inconsistently, Follows one step commands with increased time Safety/Judgement: Decreased awareness of deficits Awareness: Intellectual Problem Solving: Slow processing, Decreased initiation, Difficulty sequencing, Requires verbal cues, Requires tactile cues General Comments: Pt with self limiting behaviors with decreased awareness of deficits and need to participate to progress functional skills.        Exercises      Shoulder Instructions       General Comments NT present, educated to pause cortrak feeding prior to lowering [facility] for ADLs    Pertinent Vitals/ Pain       Pain Assessment Pain Assessment: Faces Faces Pain Scale: Hurts even more Pain Location: "everything", would not specify Pain Descriptors / Indicators: Burning, Sore, Moaning Pain Intervention(s): Monitored during session  Home Living                                          Prior Functioning/Environment              Frequency  Min 2X/week        Progress Toward Goals  OT Goals(current goals can now be found in the care plan section)  Progress towards OT goals: Not progressing toward goals - comment  Acute Rehab OT Goals Patient Stated Goal: rest a little bit OT Goal Formulation: With patient Time For Goal Achievement: 08/24/21 Potential to Achieve Goals: Fair ADL Goals Pt Will Perform Grooming: with min assist;sitting Pt Will Perform Upper Body Bathing: with mod assist;sitting Pt Will Perform Lower Body Bathing: with min assist;sitting/lateral leans;sit to/from stand;with adaptive equipment Pt Will Perform Upper Body Dressing: with max assist;with mod  assist;sitting Pt Will Perform Lower Body Dressing: with min assist;with adaptive equipment;sitting/lateral leans;sit to/from stand Pt Will Transfer to Toilet: with max assist;with mod assist;with +2 assist;stand pivot transfer Pt/caregiver will Perform Home Exercise Program: Increased ROM;Increased strength;Both right and left upper extremity;With theraband;With minimal assist;With written HEP provided Additional ADL Goal #1: Pt will complete bed mobility max - mod A in to sit EOB in prep for ADL tasks Additional ADL Goal #2: Pt will sit EOB x 5-10 minutes for grooming and UB ADLS with mod/min A for balance and support Additional ADL Goal #3: Pt will tolerate P/AAROM B UEs  Plan Discharge plan remains appropriate (pending improvements in participation)    Co-evaluation                 AM-PAC OT "6 Clicks" Daily Activity     Outcome Measure   Help from another person eating meals?: A Little Help from another person taking care of personal grooming?: A Lot Help from another person toileting, which includes using toliet, bedpan, or urinal?: Total Help from another person bathing (including washing, rinsing, drying)?: Total Help from another  person to put on and taking off regular upper body clothing?: A Lot Help from another person to put on and taking off regular lower body clothing?: Total 6 Click Score: 10    End of Session Equipment Utilized During Treatment: Oxygen  OT Visit Diagnosis: Other abnormalities of gait and mobility (R26.89);Muscle weakness (generalized) (M62.81);Adult, failure to thrive (R62.7);Pain   Activity Tolerance Patient limited by fatigue;Other (comment) (self limiting)   Patient Left in bed;with call bell/phone within reach   Nurse Communication Mobility status        Time: 6606-0045 OT Time Calculation (min): 29 min  Charges: OT General Charges $OT Visit: 1 Visit OT Treatments $Self Care/Home Management : 8-22 mins $Therapeutic Activity: 8-22  mins  Malachy Chamber, OTR/L Acute Rehab Services Office: [phone]   Layla Maw 08/16/2021, 1:19 PM

## 2021-08-16 NOTE — Progress Notes (Signed)
PROGRESS NOTE [name] [name]  JXB:[id number] DOB: [birth year] DOA: 08/05/2021 PCP: [name] Cha, MD   Brief Narrative/Hospital Course: 61 y.o. fw/ history significant of failure to thrive, dysphagia secondary to moderate esophageal dysmotility (barium swallow March 2023), psoriasis arthritis, CAD s/p CABG, DM-2, chronic systolic CHF LVEF 13%, was sent to hospital for evaluation of altered mentations hypoxia and fever. Recently hospitalized and discharged to nursing home 11 days PTA after treatment of supposed empyema and pneumonia. Patient has established history of failure to thrive and has been losing weight, and according to the record she lost about 13 kg in last 37-month.  Per report she was discharged on soft diet, last week, her diet was downgraded to pured. Despite, family observed several times patient had choking episode after eating for food. In ED- hypoxic initially briefly on 15 L of nonrebreather and titrated down to 5 L, tachycardia hypotensive but responded to IV fluid.  Febrile 101.4 CTAPE-negative for PE but patchy infiltrates on bilateral lower lung fields suspicious for pneumonia.Small right-sided pleural effusion. Patient is admitted for severe sepsis secondary to aspiration pneumonia and Bacteremia with ESBL E Coli -being managed with  Meropenem-total course 7 days, placed on Cortrak for feeding. Seen by GI who felt nothing further to add in terms of management- and if agreeable consider PEG. Seen by palliative care and have had multiple conversations with patient and family.IR evaluation placed for PEG tube placement if family agreeable for failure to thrive/dysphagia/severe protein-calorie malnutrition.. Once PEG tube is placed, core track removed can be discharged to SNF.  AKI hyponatremia hyperkalemia has resolved.  Also had hypokalemia hypophosphatemia metabolic encephalopathy.    Subjective: Seen and examined this morning.  Seen resting comfortably has no new  complaints. No acute events overnight.  Labs stable BMP this morning.  albumin remains low 2.2.  Assessment and Plan: Principal Problem:   Sepsis ([place] Active Problems:   Rheumatoid arthritis ([place]   Gastroesophageal reflux disease without esophagitis   AMS (altered mental status)   Hypokalemia   Hypernatremia   Pressure injury of skin   Malnutrition of moderate degree   AKI (acute kidney injury) ([facility]   Adult failure to thrive syndrome   Dysphagia   Chronic systolic CHF (congestive heart failure) (HCC)   Infection due to ESBL-producing Escherichia coli   Hypophosphatemia   Folate deficiency  Severe sepsis due to ESBL E. coli bacteremia Aspiration pneumonia Possible UTI Acute respiratory with hypoxia/lactic acidosis: Blood culture ESBL E. coli seen by ID. Completed 7 days meropenem  08/14/21. Sepsis resolved.  Failure to thrive Dysphagia-esophageal dysmotility Severe protein calorie malnutrition POA: Patient esophageal dysmotility noted to have had swallowing problems, seen by speech back in March with inadequate nutrition severe hypoalbuminemia.  Getting tube feed with core track, dietitian following, PMT following, seen by GI-EGD would not improve dysmotility or decrease of aspiration pneumonia and will not change her discharge.IR consulted for PEG tube placement.  CT scan anatomy amenable for PEG tube per IR.IR advised to hold aspirin x5 days.  Patient is  agreeable for PEG tube- planned for 08/20/21.  Hypertension Chronic systolic CHF: BP remains well controlled continue home Lopressor home regimen, monitor fluid status, CHF is compensated.  Acute metabolic encephalopathy in the setting of sepsis: Overall condition doing well.   AKI: Creatinine has improved.  Continue enteral nutrition.   Hyperkalemia resolved Hypokalemia/hypophosphatemia: Resolved GERD continue PPI  Anemia/folate deficiency without overt bleeding monitor H&H transfuse for less than 7 g.Anemia panel with  iron level of 19,  folate of 4.0, ferritin at 1850, TIBC not calculated.  Vitamin B12 levels of 421  Pseudohypocalcemia correctedcalcium stable  Psoriasis arthritis: On outpatient follow-up with rheumatology  Pressure injury POA as below. Pressure Injury 08/05/21 Buttocks Right Stage 2 -  Partial thickness loss of dermis presenting as a shallow open injury with a red, pink wound bed without slough. red, no drainage (Active)  08/05/21 1835  Location: Buttocks  Location Orientation: Right  Staging: Stage 2 -  Partial thickness loss of dermis presenting as a shallow open injury with a red, pink wound bed without slough.  Wound Description (Comments): red, no drainage  Present on Admission: Yes     Pressure Injury 08/05/21 Hip Right Stage 2 -  Partial thickness loss of dermis presenting as a shallow open injury with a red, pink wound bed without slough. (Active)  08/05/21 1837  Location: Hip  Location Orientation: Right  Staging: Stage 2 -  Partial thickness loss of dermis presenting as a shallow open injury with a red, pink wound bed without slough.  Wound Description (Comments):   Present on Admission: Yes   DVT prophylaxis: heparin injection 5,000 Units Start: 08/05/21 1545 Code Status:   Code Status: Full Code Family Communication: plan of care discussed with patient/ no family at bedside.  PMT discussed with palliative care and they have updated family at bedside. I called her son [name] [name] and updated re POC.  Patient status VW:[id number] because of ongoing need for further procedure of PEG tube Level of care:Telemetry Medical   Dispo: The patient is from: [facility]            Anticipated disposition: Skilled nursing facility once tolerating tube feeds after PEG placement and core track is removed  Mobility Assessment (last 72 hours)     Mobility Assessment     Row Name 08/16/21 0741 08/15/21 2137 08/15/21 0745 08/15/21 0400 08/14/21 2000   Does  patient have an order for bedrest or is patient medically unstable No - Continue assessment No - Continue assessment Yes- Bedfast (Level 1) - Complete Yes- Bedfast (Level 1) - Complete Yes- Bedfast (Level 1) - Complete   What is the highest level of mobility based on the progressive mobility assessment? Level 1 (Bedfast) - Unable to balance while sitting on edge of bed Level 1 (Bedfast) - Unable to balance while sitting on edge of bed Level 1 (Bedfast) - Unable to balance while sitting on edge of bed Level 1 (Bedfast) - Unable to balance while sitting on edge of bed Level 1 (Bedfast) - Unable to balance while sitting on edge of bed   Is the above level different from baseline mobility prior to current illness? Yes - Recommend PT order No - Consider discontinuing PT/OT Yes - Recommend PT order Yes - Recommend PT order Yes - Recommend PT order    Row Name 08/14/21 1300 08/14/21 0900 08/13/21 1323 08/13/21 1300 08/13/21 0900   Does patient have an order for bedrest or is patient medically unstable -- Yes- Bedfast (Level 1) - Complete -- -- Yes- Bedfast (Level 1) - Complete   What is the highest level of mobility based on the progressive mobility assessment? Level 1 (Bedfast) - Unable to balance while sitting on edge of bed Level 1 (Bedfast) - Unable to balance while sitting on edge of bed Level 1 (Bedfast) - Unable to balance while sitting on edge of bed Level 1 (Bedfast) - Unable to balance while sitting on edge of bed  Level 1 (Bedfast) - Unable to balance while sitting on edge of bed   Is the above level different from baseline mobility prior to current illness? -- Yes - Recommend PT order -- -- Yes - Recommend PT order             Objective: Vitals last 24 hrs: Vitals:   08/15/21 0500 08/15/21 0803 08/15/21 2139 08/16/21 0515  BP:  128/73 131/77 117/73  Pulse:  85 (!) 107 89  Resp:  '18 18 20  '$ Temp:  98.6 F (37 C) 99 F (37.2 C) 97.9 F (36.6 C)  TempSrc:  Oral  Oral  SpO2:  98% 98% 100%   Weight: 78.5 kg      Weight change:   Physical Examination: General exam: AA, older than stated age, weak appearing. HEENT:Oral mucosa moist, Ear/Nose WNL grossly, dentition normal. Respiratory system: bilaterally diminished, no use of accessory muscle Cardiovascular system: S1 & S2 +, No JVD,. Gastrointestinal system: Abdomen soft,NT,ND,BS+ Nervous System:Alert, awake, moving extremities and grossly nonfocal Extremities: LE ankle edema none, distal peripheral pulses palpable.  Skin: No rashes,no icterus. MSK: Normal muscle bulk,tone, power   Medications reviewed:  Scheduled Meds:  atorvastatin  80 mg Per Tube Daily   escitalopram  10 mg Per Tube QHS   feeding supplement (PROSource TF)  45 mL Per Tube BID   folic acid  1 mg Per Tube Daily   free water  175 mL Per Tube Q4H   gabapentin  100 mg Per Tube Q12H   heparin  5,000 Units Subcutaneous Q8H   liver oil-zinc oxide   Topical QID   metoprolol tartrate  25 mg Per Tube BID   pantoprazole sodium  40 mg Per Tube Daily   phosphorus  250 mg Per Tube Daily   Continuous Infusions:  feeding supplement (JEVITY 1.5 CAL/FIBER) 1,000 mL (08/14/21 1806)    Pressure Injury 05/01/21 Sacrum Mid Stage 1 -  Intact skin with non-blanchable redness of a localized area usually over a bony prominence. (Active)  05/01/21 0400  Location: Sacrum  Location Orientation: Mid  Staging: Stage 1 -  Intact skin with non-blanchable redness of a localized area usually over a bony prominence.  Wound Description (Comments):   Present on Admission: Yes     Pressure Injury 05/08/21 Buttocks Left;Right Stage 2 -  Partial thickness loss of dermis presenting as a shallow open injury with a red, pink wound bed without slough. (Active)  05/08/21 1951  Location: Buttocks  Location Orientation: Left;Right  Staging: Stage 2 -  Partial thickness loss of dermis presenting as a shallow open injury with a red, pink wound bed without slough.  Wound Description  (Comments):   Present on Admission:      Pressure Injury 08/05/21 Buttocks Right Stage 2 -  Partial thickness loss of dermis presenting as a shallow open injury with a red, pink wound bed without slough. red, no drainage (Active)  08/05/21 1835  Location: Buttocks  Location Orientation: Right  Staging: Stage 2 -  Partial thickness loss of dermis presenting as a shallow open injury with a red, pink wound bed without slough.  Wound Description (Comments): red, no drainage  Present on Admission: Yes  Dressing Type Foam - Lift dressing to assess site every shift 08/16/21 0741     Pressure Injury 08/05/21 Hip Right Stage 2 -  Partial thickness loss of dermis presenting as a shallow open injury with a red, pink wound bed without slough. (Active)  08/05/21 1837  Location: Hip  Location Orientation: Right  Staging: Stage 2 -  Partial thickness loss of dermis presenting as a shallow open injury with a red, pink wound bed without slough.  Wound Description (Comments):   Present on Admission: Yes  Dressing Type Foam - Lift dressing to assess site every shift 08/16/21 0741   Diet Order             Diet NPO time specified Except for: Ice Chips  Diet effective now                    Nutrition Problem: Moderate Malnutrition Etiology: chronic illness (dysphagia) Signs/Symptoms: mild muscle depletion, moderate muscle depletion, mild fat depletion, moderate fat depletion Interventions: Refer to RD note for recommendations   Intake/Output Summary (Last 24 hours) at 08/16/2021 0802 Last data filed at 08/16/2021 0518 Gross per 24 hour  Intake 7089 ml  Output 700 ml  Net 6389 ml    Net IO Since Admission: 19,366.91 mL [08/16/21 0802]  Wt Readings from Last 3 Encounters:  08/15/21 78.5 kg  07/30/21 68 kg  07/29/21 68.6 kg     Unresulted Labs (From admission, onward)     Start     Ordered   08/08/21 0500  Renal function panel  Daily,   R     Question:  Specimen collection method   Answer:  Lab=Lab collect   08/07/21 1745          Data Reviewed: I have personally reviewed following labs and imaging studies CBC: Recent Labs  Lab 08/10/21 0236  WBC 6.0  HGB 10.0*  HCT 30.9*  MCV 93.1  PLT 120*    Basic Metabolic Panel: Recent Labs  Lab 08/10/21 0236 08/11/21 0252 08/12/21 0229 08/13/21 0839 08/14/21 0407 08/15/21 0330 08/16/21 0257  NA 134*   < > 140 141 140 138 138  K 4.1   < > 4.5 4.3 4.2 4.2 4.0  CL 107   < > 111 107 105 107 104  CO2 22   < > '25 28 29 27 28  '$ GLUCOSE 168*   < > 125* 134* 126* 127* 117*  BUN 7*   < > 6* 6* 6* 7* 11  CREATININE 0.44   < > 0.42* 0.42* 0.54 0.39* 0.53  CALCIUM 6.9*   < > 7.9* 8.4* 8.7* 8.8* 8.9  MG 2.0  --  2.0 1.8  --   --   --   PHOS 1.2*   < > 1.5* 3.5 2.1* 2.4* 3.2   < > = values in this interval not displayed.    GFR: Estimated Creatinine Clearance: 77.1 mL/min (by C-G formula based on SCr of 0.53 mg/dL). Liver Function Tests: Recent Labs  Lab 08/12/21 0229 08/13/21 0839 08/14/21 0407 08/15/21 0330 08/16/21 0257  ALBUMIN 2.1* 2.4* 2.6* 2.3* 2.2*    No results for input(s): LIPASE, AMYLASE in the last 168 hours. No results for input(s): AMMONIA in the last 168 hours. Coagulation Profile: No results for input(s): INR, PROTIME in the last 168 hours. BNP (last 3 results) No results for input(s): PROBNP in the last 8760 hours. HbA1C: No results for input(s): HGBA1C in the last 72 hours. CBG: Recent Labs  Lab 08/15/21 0800 08/15/21 1206 08/15/21 1701 08/15/21 2142 08/16/21 0539  GLUCAP 123* 145* 103* 76 124*    Lipid Profile: No results for input(s): CHOL, HDL, LDLCALC, TRIG, CHOLHDL, LDLDIRECT in the last 72 hours. Thyroid Function Tests: No results for input(s): TSH, T4TOTAL,  FREET4, T3FREE, THYROIDAB in the last 72 hours. Sepsis Labs: No results for input(s): PROCALCITON, LATICACIDVEN in the last 168 hours.  No results found for this or any previous visit (from the past 240  hour(s)).   Antimicrobials: Anti-infectives (From admission, onward)    Start     Dose/Rate Route Frequency Ordered Stop   08/07/21 1400  meropenem (MERREM) 1 g in sodium chloride 0.9 % 100 mL IVPB        1 g 200 mL/hr over 30 Minutes Intravenous Every 8 hours 08/07/21 0915 08/14/21 1359   08/06/21 1200  vancomycin (VANCOREADY) IVPB 1500 mg/300 mL  Status:  Discontinued        1,500 mg 150 mL/hr over 120 Minutes Intravenous Every 24 hours 08/05/21 1107 08/07/21 1203   08/06/21 0400  meropenem (MERREM) 1 g in sodium chloride 0.9 % 100 mL IVPB  Status:  Discontinued        1 g 200 mL/hr over 30 Minutes Intravenous Every 12 hours 08/06/21 0241 08/07/21 0915   08/05/21 1932  vancomycin variable dose per unstable renal function (pharmacist dosing)  Status:  Discontinued         Does not apply See admin instructions 08/05/21 1933 08/07/21 0916   08/05/21 1923  ceFEPIme (MAXIPIME) 2 g in sodium chloride 0.9 % 100 mL IVPB  Status:  Discontinued        2 g 200 mL/hr over 30 Minutes Intravenous Every 12 hours 08/05/21 1923 08/06/21 0241   08/05/21 1100  ceFEPIme (MAXIPIME) 2 g in sodium chloride 0.9 % 100 mL IVPB  Status:  Discontinued        2 g 200 mL/hr over 30 Minutes Intravenous Every 8 hours 08/05/21 1056 08/05/21 1923   08/05/21 1100  vancomycin (VANCOREADY) IVPB 1500 mg/300 mL        1,500 mg 150 mL/hr over 120 Minutes Intravenous  Once 08/05/21 1056 08/05/21 1409      Culture/Microbiology    Component Value Date/Time   SDES BLOOD BLOOD LEFT WRIST 08/05/2021 1051   SPECREQUEST  08/05/2021 1051    BOTTLES DRAWN AEROBIC AND ANAEROBIC Blood Culture adequate volume   CULT (A) 08/05/2021 1051    ESCHERICHIA COLI Confirmed Extended Spectrum Beta-Lactamase Producer (ESBL).  In bloodstream infections from ESBL organisms, carbapenems are preferred over piperacillin/tazobactam. They are shown to have a lower risk of mortality.    REPTSTATUS 08/08/2021 FINAL 08/05/2021 1051  Radiology  Studies: CT ABDOMEN WO CONTRAST  Result Date: 08/14/2021 CLINICAL DATA:  Altered mental status, sepsis, dysphagia and evaluation for possible gastrostomy tube placement for nutrition. EXAM: CT ABDOMEN WITHOUT CONTRAST TECHNIQUE: Multidetector CT imaging of the abdomen was performed following the standard protocol without IV contrast. RADIATION DOSE REDUCTION: This exam was performed according to the departmental dose-optimization program which includes automated exposure control, adjustment of the mA and/or kV according to patient size and/or use of iterative reconstruction technique. COMPARISON:  CT a of the chest on 08/05/2021 FINDINGS: Lower chest: Stable small right pleural effusion with associated right basilar atelectasis. Mild atelectasis at the posterior left lung base. Hepatobiliary: Unenhanced appearance of the liver is unremarkable. The gallbladder is contracted. Pancreas: Unremarkable. No pancreatic ductal dilatation or surrounding inflammatory changes. Spleen: Normal in size without focal abnormality. Adrenals/Urinary Tract: Adrenal glands are unremarkable. Kidneys are normal, without renal calculi, focal lesion, or hydronephrosis. Stomach/Bowel: The tube extends into the proximal stomach just below the GE junction. Bowel shows no evidence of obstruction, ileus or inflammation. No free air  identified. No hiatal hernia. The stomach is normally positioned with predominant horizontal lie. The transverse colon is well below the stomach and there is no interposition of bowel between the stomach and abdominal wall. Left lobe of the liver does cross the proximal stomach. Vascular/Lymphatic: Aortic atherosclerosis without aneurysm. No enlarged abdominal lymph nodes. Other: No abdominal wall hernia. No ascites or abnormal fluid collections visualized. Musculoskeletal: No acute or significant osseous findings. IMPRESSION: 1. Normal positioning of stomach with no anatomic contraindication to potential attempted  percutaneous gastrostomy tube placement. 2. Current feeding tube extends barely below the GE junction into the proximal stomach. 3. Stable small right pleural effusion with associated right basilar atelectasis. 4. Atherosclerosis of the abdominal aorta without aneurysm. Electronically Signed   By: Aletta Edouard M.D.   On: 08/14/2021 11:16     LOS: 11 days   Antonieta Pert, MD [facility]  08/16/2021, 8:02 AM

## 2021-08-16 NOTE — Progress Notes (Signed)
Physical Therapy Treatment Patient Details Name: [name] [name] MRN: [id number] DOB: 10-Feb-1961 Today's Date: 08/16/2021   History of Present Illness [name] [name] is a 61 y.o. female with medical history significant of failure to thrive, dysphagia secondary to moderate esophageal dysmotility (barium swallow March 2023), psoriasis arthritis, CAD status post CABG, DM-2, chronic systolic CHF LVEF 57%, was sent to hospital for evaluation of altered mentations hypoxia and fever.    PT Comments    Pt received supine in bed and agreeable for EOB attempt. PT able to mobilize LLE to and off L EOB, when asked to mobilize R pt unable to initiate movement and declining assistance from this PTA, pt then leaning toward R EOB to when asked to elevate trunk, max cues to redirect to L side of bed, pt unable to coordinate movements and bring UE to bed rail or roll toward L side of bed, and delcining max attempts at assistance from this PTA. Further attempts to come to EOB deferred secondary to pt safety.  Pt perseverating throughout session on need to void despite repetitively reminding pt of purewick placement. Pt continues to benefit from skilled PT services to progress toward functional mobility goals.    Recommendations for follow up therapy are one component of a multi-disciplinary discharge planning process, led by the attending physician.  Recommendations may be updated based on patient status, additional functional criteria and insurance authorization.  Follow Up Recommendations  Skilled nursing-short term rehab (<3 hours/day)     Assistance Recommended at Discharge Frequent or constant Supervision/Assistance  Patient can return home with the following Two people to help with walking and/or transfers;Two people to help with bathing/dressing/bathroom   Equipment Recommendations  None recommended by PT    Recommendations for Other Services       Precautions / Restrictions Precautions Precautions:  Fall;Other (comment) Precaution Comments: cortrak Restrictions Weight Bearing Restrictions: No     Mobility  Bed Mobility Overal bed mobility: Needs Assistance Bed Mobility: Rolling Rolling: Max assist   Supine to sit: Total assist Sit to supine: Total assist   General bed mobility comments: able to mobilize LLE to and off L EOB, when asked to mobilize R pt unable and declining assistance from this PTA, pt then leaning toward R EOB to elevate trunk, max cues to redirect to L side of bed, pt unable to coordinate movements, and delcining assistance    Transfers                   General transfer comment: unable    Ambulation/Gait               General Gait Details: unable   Stairs             Wheelchair Mobility    Modified Rankin (Stroke Patients Only)       Balance       Sitting balance - Comments: not assesed                                    Cognition Arousal/Alertness: Awake/alert Behavior During Therapy: Flat affect Overall Cognitive Status: No family/caregiver present to determine baseline cognitive functioning Area of Impairment: Following commands, Awareness, Problem solving, Safety/judgement, Memory                     Memory: Decreased short-term memory Following Commands: Follows one step commands inconsistently, Follows one step commands  with increased time Safety/Judgement: Decreased awareness of deficits Awareness: Intellectual Problem Solving: Slow processing, Decreased initiation, Difficulty sequencing, Requires verbal cues, Requires tactile cues General Comments: decreased awareness of deficits and need to participate to progress functional skills.        Exercises      General Comments General comments (skin integrity, edema, etc.): pt perseverating on need to urinate throughout session, consistently reminding pt of purewick, unable to plan/coordinate movments to come to sitting EOB       Pertinent Vitals/Pain Pain Assessment Pain Assessment: Faces Faces Pain Scale: Hurts little more Pain Location: "everything", would not specify Pain Descriptors / Indicators: Burning, Sore, Moaning Pain Intervention(s): Limited activity within patient's tolerance, Monitored during session    Home Living                          Prior Function            PT Goals (current goals can now be found in the care plan section) Acute Rehab PT Goals PT Goal Formulation: With patient Time For Goal Achievement: 08/23/21    Frequency    Min 2X/week      PT Plan Current plan remains appropriate    Co-evaluation              AM-PAC PT "6 Clicks" Mobility   Outcome Measure  Help needed turning from your back to your side while in a flat bed without using bedrails?: Total Help needed moving from lying on your back to sitting on the side of a flat bed without using bedrails?: Total Help needed moving to and from a bed to a chair (including a wheelchair)?: Total Help needed standing up from a chair using your arms (e.g., wheelchair or bedside chair)?: Total Help needed to walk in hospital room?: Total Help needed climbing 3-5 steps with a railing? : Total 6 Click Score: 6    End of Session Equipment Utilized During Treatment: Oxygen Activity Tolerance: Patient limited by pain Patient left: in bed;with call bell/phone within reach Nurse Communication: Mobility status PT Visit Diagnosis: Muscle weakness (generalized) (M62.81);Pain;Other abnormalities of gait and mobility (R26.89) Pain - Right/Left:  (bilateral, all over) Pain - part of body: Knee (ears)     Time: 5170-0174 PT Time Calculation (min) (ACUTE ONLY): 17 min  Charges:  $Therapeutic Activity: 8-22 mins                     Julyan Gales R. PTA Acute Rehabilitation Services Office: [place] 08/16/2021, 3:00 PM

## 2021-08-16 NOTE — Consult Note (Signed)
Chief Complaint: Dysphagia  Referring Physician(s): Eugenie Filler, MD  Supervising Physician: Aletta Edouard  Patient Status: [facility] - In-pt  History of Present Illness: [name] [name] is a 61 y.o. female with medical history significant of failure to thrive, dysphagia secondary to moderate esophageal dysmotility (barium swallow March 2023), psoriasis arthritis, CAD status post CABG, DM-2, chronic systolic CHF LVEF 25%.  Her family had noticed her choking during meals.   She was admitted with sepsis due to aspiration pneumonia.  She has been treated for the pneumonia with Meropenem.   We are asked to place a gastrostomy tube.  She is currently afebrile and WBC is normal.  Past Medical History:  Diagnosis Date   Acute ST elevation myocardial infarction (STEMI) involving right coronary artery ([place]) 10/04/2015   x 2   Anxiety    Anxiety and depression    CAD (coronary artery disease)    a. CABG 06/2015: LIMA-LAD, SVG-RCA, SVG-OM2 b. STEMI s/p PCI to distal RCA lesion with a small Promus Premier stent. ( patent LIMA to the LAD, occluded SVG to OM, occluded SVG to RCA.)   CAD (coronary artery disease) of artery bypass graft; occluded SVG to RCA and occluded SVG to OM 10/08/2015   Empty sella (HCC)    GERD (gastroesophageal reflux disease)    Headache    from stress   Hematuria wed 06-13-2020   Hidradenitis axillaris    History of kidney stones    Hyperlipemia    Hypertension    Lupus ([place]) dx'd 2008   "they think I have this; have to get retested" (06/12/2015)   Obesity (BMI 30.0-34.9)    Pneumonia X 2   last time yrs ago   PONV (postoperative nausea and vomiting)    likes iv med or scopolamine patch works well   Psoriasis 06/15/2020   hands elbows, ears, small amount on scalp and bridge of nose   Rheumatoid aortitis    psorriatic arthritis   Rheumatoid arthritis ([place])    S/P angioplasty with stent; 10/04/15 to distal RCA lesion. with Promus premier. 10/08/2015    Scoliosis    Sleep apnea    patient states "it is mild" no cpap needed   Stroke [facility]) March 2014   left MCA branches; "they say I have them all the time", denies residual on 06/12/2015   Tobacco abuse    Type II diabetes mellitus ([place])    Urinary frequency    and urgency   Wears glasses    for reading    Past Surgical History:  Procedure Laterality Date   ABDOMINAL HYSTERECTOMY  31 yrs ago   total has ovaies   BREAST BIOPSY Bilateral yrd ago   benign   CARDIAC CATHETERIZATION N/A 06/13/2015   Procedure: Left Heart Cath and Coronary Angiography;  Surgeon: Peter M Martinique, MD;  Location: [place] CV [facility];  Service: Cardiovascular;  Laterality: N/A;   CARDIAC CATHETERIZATION N/A 10/04/2015   Procedure: Left Heart Cath and Coronary Angiography;  Surgeon: [name], MD;  Location: [place] [facility];  Service: Cardiovascular;  Laterality: N/A;   CARDIAC CATHETERIZATION N/A 10/04/2015   Procedure: Coronary Stent Intervention;  Surgeon: [name], MD;  Location: [place] [facility];  Service: Cardiovascular;  Laterality: N/A;   CORONARY ARTERY BYPASS GRAFT N/A 06/19/2015   Procedure: CORONARY ARTERY BYPASS GRAFTING (CABG) TIMES FOUR USING LEFT INTERNAL MAMMARY, RIGHT SAPHENOUS LEG VEIN AND CRYO SAPHENOUS VEIN;  Surgeon: [name], MD;  Location: [facility];  Service: Open Heart Surgery;  Laterality: N/A;   CYSTOSCOPY WITH RETROGRADE PYELOGRAM, URETEROSCOPY AND STENT PLACEMENT Left 06/21/2020   Procedure: CYSTOSCOPY WITH RETROGRADE PYELOGRAM, URETEROSCOPY, BASKET STONE EXTRACTION AND  STENT PLACEMENT;  Surgeon: Robley Fries, MD;  Location: [facility];  Service: Urology;  Laterality: Left;   DILATION AND CURETTAGE OF UTERUS  yrs ago   HOLMIUM LASER APPLICATION Left 04/23/7822   Procedure: HOLMIUM LASER APPLICATION;  Surgeon: Robley Fries, MD;  Location: [facility];  Service: Urology;  Laterality: Left;   HYDRADENITIS EXCISION Right  12/20/2019   Procedure: EXCISION OF RIGHT  HIDRADENITIS AXILLARY;  Surgeon: Donnie Mesa, MD;  Location: [place];  Service: General;  Laterality: Right;   IR URETERAL STENT LEFT NEW ACCESS W/O SEP NEPHROSTOMY CATH  01/10/2019   NEPHROLITHOTOMY Left 01/10/2019   Procedure: NEPHROLITHOTOMY PERCUTANEOUS;  Surgeon: Kathie Rhodes, MD;  Location: [facility];  Service: Urology;  Laterality: Left;   TEE WITHOUT CARDIOVERSION N/A 06/19/2015   Procedure: TRANSESOPHAGEAL ECHOCARDIOGRAM (TEE);  Surgeon: [name], MD;  Location: [place];  Service: Open Heart Surgery;  Laterality: N/A;   TONSILLECTOMY  age 22   TUBAL LIGATION  many yrs ago    Allergies: Apremilast and Latex  Medications: Prior to Admission medications   Medication Sig Start Date End Date Taking? Authorizing Provider  acetaminophen (TYLENOL) 650 MG CR tablet Take 1 tablet (650 mg total) by mouth every 8 (eight) hours as needed for pain. 07/25/21  Yes Patrecia Pour, MD  amLODipine (NORVASC) 2.5 MG tablet Take 1 tablet (2.5 mg total) by mouth daily. 06/20/21  Yes Medina-Vargas, Monina C, NP  aspirin EC 81 MG EC tablet Take 1 tablet (81 mg total) by mouth daily. 06/25/15  Yes Lars Pinks M, [name]  atorvastatin (LIPITOR) 80 MG tablet TAKE 1 TABLET EVERY DAY  AT  6PM 06/20/21  Yes Medina-Vargas, Monina C, NP  B Complex-C (B-COMPLEX WITH VITAMIN C) tablet Place 1 tablet into feeding tube daily. 05/10/21  Yes Ghimire, Henreitta Leber, MD  cholecalciferol (VITAMIN D) 25 MCG (1000 UNIT) tablet Take 1,000 Units by mouth in the morning and at bedtime.   Yes [provider]  escitalopram (LEXAPRO) 10 MG tablet Take 1 tablet (10 mg total) by mouth at bedtime. 06/20/21  Yes Medina-Vargas, Monina C, NP  Evolocumab with Infusor ([place]) 420 MG/3.5ML SOCT Inject 420 mg into the skin every 30 (thirty) days. 04/26/20  Yes Kroeger, Lorelee Cover., [name]  feeding supplement (ENSURE ENLIVE / ENSURE PLUS) LIQD Take 237 mLs by mouth 3 (three) times  daily between meals. 07/29/21  Yes Patrecia Pour, MD  folic acid (FOLVITE) 1 MG tablet Take 1 tablet (1 mg total) by mouth daily. 07/26/21  Yes Patrecia Pour, MD  gabapentin (NEURONTIN) 100 MG capsule Take 1 capsule (100 mg total) by mouth 2 (two) times daily. 06/20/21  Yes Medina-Vargas, Monina C, NP  guaiFENesin (MUCINEX) 600 MG 12 hr tablet Take 1 tablet (600 mg total) by mouth 2 (two) times daily as needed (congestion.). 06/20/21  Yes Medina-Vargas, Monina C, NP  metFORMIN (GLUCOPHAGE) 500 MG tablet Take 1 tablet (500 mg total) by mouth 2 (two) times daily. 06/20/21  Yes Medina-Vargas, Monina C, NP  metoprolol tartrate (LOPRESSOR) 25 MG tablet Take 25 mg by mouth 2 (two) times daily.   Yes [provider]  nitroGLYCERIN (NITROSTAT) 0.4 MG SL tablet DISSOLVE 1 TABLET UNDER THE TONGUE EVERY 5 MINUTES  AS  NEEDED FOR CHEST PAIN. MAX  OF 3 TABLETS IN 15 MINUTES. CALL 911 IF PAIN PERSISTS. 06/20/21  Yes Medina-Vargas, Monina C, NP  ondansetron (ZOFRAN-ODT) 4 MG disintegrating tablet Take 1 tablet (4 mg total) by mouth every 8 (eight) hours as needed. Patient taking differently: Take 4 mg by mouth every 8 (eight) hours as needed for nausea or vomiting. 06/20/21  Yes Medina-Vargas, Monina C, NP  pantoprazole (PROTONIX) 40 MG tablet Take 1 tablet (40 mg total) by mouth daily. 06/20/21  Yes Medina-Vargas, Monina C, NP  polyethylene glycol (MIRALAX / GLYCOLAX) 17 g packet Take 17 g by mouth daily.   Yes [provider]  thiamine (VITAMIN B-1) 100 MG tablet Take 250 mg by mouth daily.   Yes [provider]     Family History  Problem Relation Age of Onset   Diabetes Mother    Heart failure Mother    Breast cancer Mother        64s   Stroke Maternal Aunt    Depression Son    Diabetes Other        maternal   Breast cancer Other        2 diff great maternal aunts -  both 19s   Alzheimer's disease Other        maternal    Social History   Socioeconomic History   Marital status:  Widowed    Spouse name: Not on file   Number of children: 2   Years of education: college   Highest education level: Some college, no degree  Occupational History   Occupation: Disability  Tobacco Use   Smoking status: Former    Packs/day: 0.50    Years: 36.00    Pack years: 18.00    Types: Cigarettes    Quit date: 06/12/2014    Years since quitting: 7.1   Smokeless tobacco: Never  Vaping Use   Vaping Use: Never used  Substance and Sexual Activity   Alcohol use: No    Alcohol/week: 0.0 standard drinks   Drug use: No   Sexual activity: Not Currently  Other Topics Concern   Not on file  Social History Narrative   Patient currently lives at home with her disabled husband who was shot in 2009 and he is total care. She lives with her son and daughter and there is an 8 that comes in. Patient currently is on disability   She occasionally drinks caffeine.    Social Determinants of Radio broadcast assistant Strain: Not on file  Food Insecurity: Food Insecurity Present   Worried About Charity fundraiser in the Last Year: Sometimes true   Arboriculturist in the Last Year: Sometimes true  Transportation Needs: Unmet Transportation Needs   Lack of Transportation (Medical): No   Lack of Transportation (Non-Medical): Yes  Physical Activity: Not on file  Stress: Not on file  Social Connections: Not on file     Review of Systems: A 12 point ROS discussed and pertinent positives are indicated in the HPI above.  All other systems are negative.  Review of Systems  Vital Signs: BP 134/74 (BP Location: Left Arm)   Pulse 88   Temp 98.2 F (36.8 C) (Oral)   Resp (!) 23   Wt 173 lb (78.5 kg)   SpO2 99%   BMI 27.92 kg/m   Physical Exam Vitals reviewed.  Constitutional:      Appearance: Normal appearance.  Cardiovascular:  Rate and Rhythm: Normal rate and regular rhythm.  Pulmonary:     Effort: Pulmonary effort is normal. No respiratory distress.     Breath sounds: Normal  breath sounds.  Abdominal:     General: There is no distension.     Palpations: Abdomen is soft.     Tenderness: There is no abdominal tenderness.  Skin:    General: Skin is warm and dry.  Neurological:     General: No focal deficit present.     Mental Status: She is alert and oriented to person, place, and time.  Psychiatric:        Mood and Affect: Mood normal.        Behavior: Behavior normal.        Thought Content: Thought content normal.        Judgment: Judgment normal.    Imaging: CT ABDOMEN WO CONTRAST  Result Date: 08/14/2021 CLINICAL DATA:  Altered mental status, sepsis, dysphagia and evaluation for possible gastrostomy tube placement for nutrition. EXAM: CT ABDOMEN WITHOUT CONTRAST TECHNIQUE: Multidetector CT imaging of the abdomen was performed following the standard protocol without IV contrast. RADIATION DOSE REDUCTION: This exam was performed according to the departmental dose-optimization program which includes automated exposure control, adjustment of the mA and/or kV according to patient size and/or use of iterative reconstruction technique. COMPARISON:  CT a of the chest on 08/05/2021 FINDINGS: Lower chest: Stable small right pleural effusion with associated right basilar atelectasis. Mild atelectasis at the posterior left lung base. Hepatobiliary: Unenhanced appearance of the liver is unremarkable. The gallbladder is contracted. Pancreas: Unremarkable. No pancreatic ductal dilatation or surrounding inflammatory changes. Spleen: Normal in size without focal abnormality. Adrenals/Urinary Tract: Adrenal glands are unremarkable. Kidneys are normal, without renal calculi, focal lesion, or hydronephrosis. Stomach/Bowel: The tube extends into the proximal stomach just below the GE junction. Bowel shows no evidence of obstruction, ileus or inflammation. No free air identified. No hiatal hernia. The stomach is normally positioned with predominant horizontal lie. The transverse colon  is well below the stomach and there is no interposition of bowel between the stomach and abdominal wall. Left lobe of the liver does cross the proximal stomach. Vascular/Lymphatic: Aortic atherosclerosis without aneurysm. No enlarged abdominal lymph nodes. Other: No abdominal wall hernia. No ascites or abnormal fluid collections visualized. Musculoskeletal: No acute or significant osseous findings. IMPRESSION: 1. Normal positioning of stomach with no anatomic contraindication to potential attempted percutaneous gastrostomy tube placement. 2. Current feeding tube extends barely below the GE junction into the proximal stomach. 3. Stable small right pleural effusion with associated right basilar atelectasis. 4. Atherosclerosis of the abdominal aorta without aneurysm. Electronically Signed   By: Aletta Edouard M.D.   On: 08/14/2021 11:16   DG Chest 1 View  Result Date: 07/21/2021 CLINICAL DATA:  Fever. EXAM: CHEST  1 VIEW COMPARISON:  Chest radiograph dated 07/17/2021 and CT dated 07/18/2021. FINDINGS: Trace right pleural effusion. There are bibasilar atelectasis. No pneumothorax. Mild cardiomegaly. Median sternotomy wires and CABG vascular clips. No acute osseous pathology. IMPRESSION: 1. Trace right pleural effusion. 2. Mild cardiomegaly. Electronically Signed   By: Anner Crete M.D.   On: 07/21/2021 02:45   DG Chest 2 View  Result Date: 07/18/2021 CLINICAL DATA:  History of recurrent right pleural effusion. EXAM: CHEST - 2 VIEW COMPARISON:  Chest radiograph 07/10/2021. FINDINGS: Stable cardiomegaly status post median sternotomy. Persistent small right pleural effusion with underlying opacities. No pneumothorax. Thoracic spine degenerative changes. IMPRESSION: Persistent small right pleural effusion and  underlying atelectasis. Electronically Signed   By: Lovey Newcomer M.D.   On: 07/18/2021 12:52   CT SOFT TISSUE NECK WO CONTRAST  Result Date: 07/21/2021 CLINICAL DATA:  61 year old female with sore throat,  difficulty swallowing, fever. EXAM: CT NECK WITHOUT CONTRAST TECHNIQUE: Multidetector CT imaging of the neck was performed following the standard protocol without intravenous contrast. RADIATION DOSE REDUCTION: This exam was performed according to the departmental dose-optimization program which includes automated exposure control, adjustment of the mA and/or kV according to patient size and/or use of iterative reconstruction technique. COMPARISON:  Neck CT 04/17/2021. FINDINGS: Mild motion artifact on series 3, which was repeated (series 9). Pharynx and larynx: Trace retained secretions. Laryngeal and pharyngeal soft tissue contours appear stable since February, within normal limits. Noncontrast parapharyngeal and retropharyngeal spaces appear negative. Salivary glands: Mildly nodular appearance of bilateral parotid glands again noted, but otherwise negative noncontrast appearance. Thyroid: Stable heterogeneous thyroid enlargement Recommend thyroid US (ref: J Am Coll Radiol. 2015 Feb;12(2): 143-50). Lymph nodes: No cervical lymphadenopathy identified. Vascular: Vascular patency is not evaluated in the absence of IV contrast. Mild carotid calcified atherosclerosis redemonstrated. Limited intracranial: Stable and negative aside from partially empty sella. Visualized orbits: Negative. Mastoids and visualized paranasal sinuses: Visualized paranasal sinuses and mastoids are stable and well aerated. Skeleton: Mandible motion artifact. Carious dentition with bilateral dental periapical lucency. Motion artifact also in the C-spine. Chronic lower cervical endplate degeneration. No acute osseous abnormality identified. Upper chest: Mild apical scarring. Lung scarring. Negative visible noncontrast superior mediastinum. IMPRESSION: 1. No acute finding in the noncontrast Neck. Pharynx and larynx appears stable since February and negative. No explanation for sore throat. Follow-up with ENT recommended if symptoms persist. 2.  Poor dentition. 3. Recommend Thyroid US (ref: J Am Coll Radiol. 2015 Feb;12(2): 143-50). Electronically Signed   By: Genevie Ann M.D.   On: 07/21/2021 11:01   CT Chest Wo Contrast  Result Date: 07/18/2021 CLINICAL DATA:  Loss of appetite shortness of breath EXAM: CT CHEST WITHOUT CONTRAST TECHNIQUE: Multidetector CT imaging of the chest was performed following the standard protocol without IV contrast. RADIATION DOSE REDUCTION: This exam was performed according to the departmental dose-optimization program which includes automated exposure control, adjustment of the mA and/or kV according to patient size and/or use of iterative reconstruction technique. COMPARISON:  Chest x-ray 07/17/2021 FINDINGS: Cardiovascular: Limited evaluation without intravenous contrast. Mild aortic atherosclerosis. No aneurysm. Post CABG changes. Coronary vascular calcification. Mild cardiomegaly. No pericardial effusion Mediastinum/Nodes: Midline trachea. No thyroid mass. No suspicious lymph nodes. Esophagus within normal limits. Lungs/Pleura: Trace right-sided pleural effusion and minimal pleural thickening. Mild emphysema. No acute airspace disease or pneumothorax. Punctate 2 mm left upper lobe pulmonary nodule, series 4, image 59. Mild bronchiectasis in the right lower lobe. Linear areas of probable scarring at the right base. Upper Abdomen: Hepatic steatosis. No acute abnormality. 15 mm left adrenal nodule with fat density, consistent with adenoma, no follow-up imaging recommended. Punctate nonobstructing kidney stone on the left. Musculoskeletal: No acute osseous abnormality. Post sternotomy changes. IMPRESSION: 1. Trace right pleural effusion and minimal pleural thickening without acute airspace disease. Minimal bronchiectasis in the right lower lobe with probable scarring. 2. Punctate 2 mm left upper lobe pulmonary nodule. No follow-up needed if patient is low-risk.This recommendation follows the consensus statement: Guidelines for  Management of Incidental Pulmonary Nodules Detected on CT Images: From the Fleischner Society 2017; Radiology 2017; 284:228-243. 3. Hepatic steatosis Aortic Atherosclerosis (ICD10-I70.0) and Emphysema (ICD10-J43.9). Electronically Signed   By: Donavan Foil  M.D.   On: 07/18/2021 18:07   CT Angio Chest PE W and/or Wo Contrast  Result Date: 08/05/2021 CLINICAL DATA:  High clinical suspicion for pulmonary embolism EXAM: CT ANGIOGRAPHY CHEST WITH CONTRAST TECHNIQUE: Multidetector CT imaging of the chest was performed using the standard protocol during bolus administration of intravenous contrast. Multiplanar CT image reconstructions and MIPs were obtained to evaluate the vascular anatomy. RADIATION DOSE REDUCTION: This exam was performed according to the departmental dose-optimization program which includes automated exposure control, adjustment of the mA and/or kV according to patient size and/or use of iterative reconstruction technique. CONTRAST:  167m OMNIPAQUE IOHEXOL 350 MG/ML SOLN COMPARISON:  Previous studies including the chest radiographs done on 08/05/2021 and CT chest done on 07/18/2021 FINDINGS: Cardiovascular: There is homogeneous enhancement in the thoracic aorta. There is ectasia of ascending thoracic aorta measuring 3.7 cm. Scattered coronary artery calcifications are seen. Metallic sutures are seen in the sternum suggesting possible previous coronary bypass surgery. Heart is enlarged in size. There are no intraluminal filling defects in the central pulmonary artery branches. Evaluation of small peripheral branches in the lower lung fields is limited by infiltrates and less than optimal contrast enhancement. Mediastinum/Nodes: No significant lymphadenopathy seen. Lungs/Pleura: There are patchy infiltrates in the posterior aspect of both lower lung fields more so on the right side. Small right pleural effusion is seen. There is no pneumothorax. Upper Abdomen: Unremarkable. Musculoskeletal:  Unremarkable. Review of the MIP images confirms the above findings. IMPRESSION: There is no evidence of pulmonary artery embolism. There is no evidence of thoracic aortic dissection. There is ectasia of ascending thoracic aorta measuring 3.7 cm. Coronary artery disease. There are patchy infiltrates in both lower lung fields, more so on the right side suggesting atelectasis/pneumonia. Small right pleural effusion is seen. Electronically Signed   By: PElmer PickerM.D.   On: 08/05/2021 14:48   DG CHEST PORT 1 VIEW  Result Date: 08/06/2021 CLINICAL DATA:  Pneumonia EXAM: PORTABLE CHEST 1 VIEW COMPARISON:  Chest x-ray dated Aug 05, 2021 FINDINGS: Cardiac and mediastinal contours are unchanged post median sternotomy and CABG. Small right pleural effusion and bibasilar opacities which are likely due to atelectasis. No evidence of pneumothorax. IMPRESSION: Small right pleural effusion and bibasilar opacities which are likely due to atelectasis. Electronically Signed   By: LYetta GlassmanM.D.   On: 08/06/2021 08:18   DG Chest Port 1 View  Result Date: 08/05/2021 CLINICAL DATA:  Fever.  Possible sepsis. EXAM: PORTABLE CHEST 1 VIEW COMPARISON:  Chest x-ray dated Jul 21, 2021. FINDINGS: Unchanged mild cardiomegaly status post CABG. Normal pulmonary vascularity. Unchanged trace right pleural effusion. No consolidation or pneumothorax. No acute osseous abnormality. IMPRESSION: 1. Unchanged trace right pleural effusion. Electronically Signed   By: WTitus DubinM.D.   On: 08/05/2021 12:07   DG Abd Portable 1V  Result Date: 08/07/2021 CLINICAL DATA:  Small bore feeding tube placement. EXAM: PORTABLE ABDOMEN - 1 VIEW COMPARISON:  05/06/2021 FINDINGS: A small bore feeding tube is noted with tip overlying the distal stomach. Visualized bowel gas pattern is unremarkable. Cardiomegaly, CABG change and small RIGHT pleural effusion again noted. IMPRESSION: Small bore feeding tube with tip overlying the distal  stomach. Electronically Signed   By: JMargarette CanadaM.D.   On: 08/07/2021 15:15   [place]  Result Date: 07/29/2021 CLINICAL DATA:  Thyromegaly, odynophagia EXAM: THYROID ULTRASOUND TECHNIQUE: Ultrasound examination of the thyroid gland and adjacent soft tissues was performed. Technologist describes technically difficult study secondary to altered mental  status, agitation. COMPARISON:  None FINDINGS: Parenchymal Echotexture: Mildly heterogenous Isthmus: 0.5 cm thickness Right lobe: 6.7 x 2.4 x 2.2 cm Left lobe: 4.7 x 2.1 x 2.1 cm _________________________________________________________ Estimated total number of nodules >/= 1 cm: 3 Number of spongiform nodules >/=  2 cm not described below (TR1): 0 Number of mixed cystic and solid nodules >/= 1.5 cm not described below (TR2): 0 _________________________________________________________ Nodule # 1: 0.6 cm benign colloid cyst, mid right Nodule # 2: Location: Right; inferior Maximum size: 1.1 cm; Other 2 dimensions: 1 x 0.9 cm Composition: mixed cystic and solid (1) Echogenicity: isoechoic (1) Shape: taller-than-wide (3) Margins: ill-defined (0) Echogenic foci: none (0) ACR TI-RADS total points: 5. ACR TI-RADS risk category: TR 4. ACR TI-RADS recommendations: *Given size (>/= 1 - 1.4 cm) and appearance, a follow-up ultrasound in 1 year should be considered based on TI-RADS criteria. _________________________________________________________ Nodule # 3: 0.8 cm complex nodule, superior left; This nodule does NOT meet TI-RADS criteria for biopsy or dedicated follow-up. Nodule # 4: 1.3 cm spongiform nodule, superior left; This nodule does NOT meet TI-RADS criteria for biopsy or dedicated follow-up. Nodule # 5: Location: Left; inferior Maximum size: 1 cm; Other 2 dimensions: 0.6 x 1 cm Composition: solid/almost completely solid (2) Echogenicity: isoechoic (1) Shape: not taller-than-wide (0) Margins: ill-defined (0) Echogenic foci: none (0) ACR TI-RADS total points: 3. ACR  TI-RADS risk category: TR 3. ACR TI-RADS recommendations: Given size (<1.4 cm) and appearance, this nodule does NOT meet TI-RADS criteria for biopsy or dedicated follow-up. _________________________________________________________ No regional cervical adenopathy identified. IMPRESSION: 1. Borderline thyromegaly with small nodules. None meets criteria for biopsy. 2. Recommend annual/biennial ultrasound follow-up of inferior right nodule as above, until stability x5 years confirmed. The above is in keeping with the ACR TI-RADS recommendations - J Am Coll Radiol 2017;14:587-595. Electronically Signed   By: Lucrezia Europe M.D.   On: 07/29/2021 08:37   VAS [place] LOWER EXTREMITY VENOUS (DVT)  Result Date: 07/21/2021  Lower Venous DVT Study Patient Name:  QUENTIN STREBEL  Date of Exam:   07/21/2021 Medical Rec #: [id number]        Accession #:    [id number] Date of Birth: 1960-06-02        Patient Gender: F Patient Age:   33 years Exam Location:  [facility] Procedure:      VAS [place] LOWER EXTREMITY VENOUS (DVT) Referring Phys: Bonnielee Haff --------------------------------------------------------------------------------  Indications: Edema.  Comparison Study: No prior study on file Performing Technologist: Darlin Coco RDMS, RVT  Examination Guidelines: A complete evaluation includes B-mode imaging, spectral Doppler, color Doppler, and power Doppler as needed of all accessible portions of each vessel. Bilateral testing is considered an integral part of a complete examination. Limited examinations for reoccurring indications may be performed as noted. The reflux portion of the exam is performed with the patient in reverse Trendelenburg.  +---------+---------------+---------+-----------+----------+--------------+ RIGHT    CompressibilityPhasicitySpontaneityPropertiesThrombus Aging +---------+---------------+---------+-----------+----------+--------------+ CFV      Full           Yes      Yes                                  +---------+---------------+---------+-----------+----------+--------------+ SFJ      Full                                                        +---------+---------------+---------+-----------+----------+--------------+  FV Prox  Full                                                        +---------+---------------+---------+-----------+----------+--------------+ FV Mid   Full                                                        +---------+---------------+---------+-----------+----------+--------------+ FV DistalFull                                                        +---------+---------------+---------+-----------+----------+--------------+ PFV      Full                                                        +---------+---------------+---------+-----------+----------+--------------+ POP      Full           Yes      Yes                                 +---------+---------------+---------+-----------+----------+--------------+ PTV      Full                                                        +---------+---------------+---------+-----------+----------+--------------+ PERO     Full                                                        +---------+---------------+---------+-----------+----------+--------------+ Gastroc  Full                                                        +---------+---------------+---------+-----------+----------+--------------+   +---------+---------------+---------+-----------+----------+--------------+ LEFT     CompressibilityPhasicitySpontaneityPropertiesThrombus Aging +---------+---------------+---------+-----------+----------+--------------+ CFV      Full           Yes      Yes                                 +---------+---------------+---------+-----------+----------+--------------+ SFJ      Full                                                         +---------+---------------+---------+-----------+----------+--------------+  FV Prox  Full                                                        +---------+---------------+---------+-----------+----------+--------------+ FV Mid   Full                                                        +---------+---------------+---------+-----------+----------+--------------+ FV DistalFull                                                        +---------+---------------+---------+-----------+----------+--------------+ PFV      Full                                                        +---------+---------------+---------+-----------+----------+--------------+ POP      Full           Yes      Yes                                 +---------+---------------+---------+-----------+----------+--------------+ PTV      Full                                                        +---------+---------------+---------+-----------+----------+--------------+ PERO     Full                                                        +---------+---------------+---------+-----------+----------+--------------+ Gastroc  Full                                                        +---------+---------------+---------+-----------+----------+--------------+     Summary: BILATERAL: - No evidence of deep vein thrombosis seen in the lower extremities, bilaterally. -No evidence of popliteal cyst, bilaterally.   *See table(s) above for measurements and observations. Electronically signed by Harold Barban MD on 07/21/2021 at 10:00:29 PM.    Final     Labs:  CBC: Recent Labs    08/07/21 0409 08/08/21 0416 08/09/21 0638 08/10/21 0236  WBC 6.6 7.4 5.6 6.0  HGB 13.2 10.1* 11.6* 10.0*  HCT 43.3 31.9* 36.4 30.9*  PLT 163 148* 124* 120*    COAGS: Recent Labs    04/30/21 1834 07/18/21 1904 08/05/21 1107  INR 1.2 1.1 1.1  APTT 29  --  45*    BMP: Recent Labs    08/13/21 0839 08/14/21 0407  08/15/21 0330 08/16/21 0257  NA 141 140 138 138  K 4.3 4.2 4.2 4.0  CL 107 105 107 104  CO2 '28 29 27 28  '$ GLUCOSE 134* 126* 127* 117*  BUN 6* 6* 7* 11  CALCIUM 8.4* 8.7* 8.8* 8.9  CREATININE 0.42* 0.54 0.39* 0.53  GFRNONAA >60 >60 >60 >60    LIVER FUNCTION TESTS: Recent Labs    07/18/21 1904 07/19/21 0500 08/05/21 1107 08/07/21 0409 08/08/21 0416 08/13/21 0839 08/14/21 0407 08/15/21 0330 08/16/21 0257  BILITOT 0.8 0.7 1.2 0.3  --   --   --   --   --   AST 68* 63* 75* 50*  --   --   --   --   --   ALT '18 16 21 17  '$ --   --   --   --   --   ALKPHOS 79 76 223* 169*  --   --   --   --   --   PROT 8.1 7.2 7.1 6.6  --   --   --   --   --   ALBUMIN 2.3* 2.1* 1.5* <1.5*   < > 2.4* 2.6* 2.3* 2.2*   < > = values in this interval not displayed.    TUMOR MARKERS: No results for input(s): AFPTM, CEA, CA199, CHROMGRNA in the last 8760 hours.  Assessment and Plan:  Dysphagia due to esophageal dysmotility.  Will proceed with Gastrostomy tube placement - tentatively scheduled for Tuesday, June 6 as IR schedule allows.  Risks and benefits image guided gastrostomy tube placement was discussed with the patient including, but not limited to the need for a barium enema during the procedure, bleeding, infection, peritonitis and/or damage to adjacent structures.  All of the patient's questions were answered, patient is agreeable to proceed.  Consent signed and in chart.  Orders placed to hold tube feeds Monday night at midnight and will hold   Thank you for allowing our service to participate in [name] [name] 's care.  Electronically Signed: Murrell Redden, [name]   08/16/2021, 3:52 PM      I spent a total of 20 Minutes  in face to face in clinical consultation, greater than 50% of which was counseling/coordinating care for Gtube placement.

## 2021-08-17 ENCOUNTER — Inpatient Hospital Stay (HOSPITAL_COMMUNITY): Payer: Medicare HMO

## 2021-08-17 LAB — RENAL FUNCTION PANEL
Albumin: 2.2 g/dL — ABNORMAL LOW (ref 3.5–5.0)
Anion gap: 7 (ref 5–15)
BUN: 11 mg/dL (ref 8–23)
CO2: 29 mmol/L (ref 22–32)
Calcium: 8.9 mg/dL (ref 8.9–10.3)
Chloride: 102 mmol/L (ref 98–111)
Creatinine, Ser: 0.46 mg/dL (ref 0.44–1.00)
GFR, Estimated: >60 mL/min (ref >60)
Glucose, Bld: 119 mg/dL — ABNORMAL HIGH (ref 70–99)
Phosphorus: 3.5 mg/dL (ref 2.5–4.6)
Potassium: 3.9 mmol/L (ref 3.5–5.1)
Sodium: 138 mmol/L (ref 135–145)

## 2021-08-17 LAB — GLUCOSE, CAPILLARY
Glucose-Capillary: 126 mg/dL — ABNORMAL HIGH (ref 70–99)
Glucose-Capillary: 139 mg/dL — ABNORMAL HIGH (ref 70–99)
Glucose-Capillary: 144 mg/dL — ABNORMAL HIGH (ref 70–99)
Glucose-Capillary: 86 mg/dL (ref 70–99)

## 2021-08-17 LAB — BRAIN NATRIURETIC PEPTIDE: B Natriuretic Peptide: 154 pg/mL — ABNORMAL HIGH (ref 0.0–100.0)

## 2021-08-17 MED ORDER — FUROSEMIDE 10 MG/ML IJ SOLN
20.0000 mg | Freq: Once | INTRAMUSCULAR | Status: AC
  Administered 2021-08-17: 20 mg via INTRAVENOUS
  Filled 2021-08-17: qty 2

## 2021-08-17 NOTE — Progress Notes (Signed)
PROGRESS NOTE [name] [name]  JOA:[id number] DOB: 01-27-1961 DOA: 08/05/2021 PCP: Leeroy Cha, MD   Brief Narrative/Hospital Course: 61 y.o. fw/ history significant of failure to thrive, dysphagia secondary to moderate esophageal dysmotility (barium swallow March 2023), psoriasis arthritis, CAD s/p CABG, DM-2, chronic systolic CHF LVEF 60%, was sent to hospital for evaluation of altered mentations hypoxia and fever. Recently hospitalized and discharged to nursing home 11 days PTA after treatment of supposed empyema and pneumonia. Patient has established history of failure to thrive and has been losing weight, and according to the record she lost about 13 kg in last 4-month.  Per report she was discharged on soft diet, last week, her diet was downgraded to pured. Despite, family observed several times patient had choking episode after eating for food. In ED- hypoxic initially briefly on 15 L of nonrebreather and titrated down to 5 L, tachycardia hypotensive but responded to IV fluid.  Febrile 101.4 CTAPE-negative for PE but patchy infiltrates on bilateral lower lung fields suspicious for pneumonia.Small right-sided pleural effusion. Patient is admitted for severe sepsis secondary to aspiration pneumonia and Bacteremia with ESBL E Coli -being managed with  Meropenem-total course 7 days, placed on Cortrak for feeding. Seen by GI who felt nothing further to add in terms of management- and if agreeable consider PEG. Seen by palliative care and have had multiple conversations with patient  and family.they wanted to proceed with PEG tube placement, IR consulted, aspirin on hold for PEG tube on 6/6    Subjective: Seen and examined this morning.  No new complaints able to lay flat Patient has been afebrile, BP stable, BMP stable this am Noted increasing weight and leg negative balance Recheck weight per nursing in 170 after removal of the bed linen/blankets Assessment and Plan: Principal  Problem:   Sepsis ([place] Active Problems:   Rheumatoid arthritis ([place]   Gastroesophageal reflux disease without esophagitis   AMS (altered mental status)   Hypokalemia   Hypernatremia   Pressure injury of skin   Malnutrition of moderate degree   AKI (acute kidney injury) ([facility]   Adult failure to thrive syndrome   Dysphagia   Chronic systolic CHF (congestive heart failure) (HCC)   Infection due to ESBL-producing Escherichia coli   Hypophosphatemia   Folate deficiency  Severe sepsis due to ESBL E. coli bacteremia Aspiration pneumonia Possible UTI Acute respiratory with hypoxia/lactic acidosis: Blood culture ESBL E. coli seen by ID. Completed 7 days meropenem  08/14/21. Sepsis resolved.  Remains hemodynamically stable and afebrile  Failure to thrive Dysphagia-esophageal dysmotility Severe protein calorie malnutrition POA: Patient esophageal dysmotility noted to have had swallowing problems, seen by speech back in March with inadequate nutrition severe hypoalbuminemia.  Getting tube feed with core track, dietitian following, PMT following, seen by GI-EGD would not improve dysmotility or decrease of aspiration pneumonia and will not change her discharge.IR consulted for PEG tube placement.  CT scan anatomy amenable for PEG tube per IR.IR advised to hold aspirin x5 days-and planning for PEG tube placement 6/6, hold tube feeds Monday night.  Hypertension Chronic systolic CHF: BP well controlled, continue metoprolol monitor fluid status-CHF is compensated. Net IO Since Admission: 19,066.91 mL [08/17/21 0830] Weight 164 on 5/27- 181 6/3.  Check BNP, chest x-ray-possible - right small right pleural effusion diffuse asymmetric right lung airspace disease increased -will do Lasix 20 mg x1.  Acute metabolic encephalopathy in the setting of sepsis: Overall condition doing well.   AKI: Creatinine has improved.  Continue enteral nutrition.  Hyperkalemia resolved Hypokalemia/hypophosphatemia:  Resolved.  On Neutra-Phos supplement. GERD continue PPI  Anemia/folate deficiency without overt bleeding monitor H&H transfuse for less than 7 g.Anemia panel with iron level of 19, folate of 4.0, ferritin at 1850, TIBC not calculated.  Vitamin B12 levels of 421.  Pseudohypocalcemia correctedcalcium stable  Psoriasis arthritis: On outpatient follow-up with rheumatology  Pressure injury POA as below. Pressure Injury 08/05/21 Buttocks Right Stage 2 -  Partial thickness loss of dermis presenting as a shallow open injury with a red, pink wound bed without slough. red, no drainage (Active)  08/05/21 1835  Location: Buttocks  Location Orientation: Right  Staging: Stage 2 -  Partial thickness loss of dermis presenting as a shallow open injury with a red, pink wound bed without slough.  Wound Description (Comments): red, no drainage  Present on Admission: Yes     Pressure Injury 08/05/21 Hip Right Stage 2 -  Partial thickness loss of dermis presenting as a shallow open injury with a red, pink wound bed without slough. (Active)  08/05/21 1837  Location: Hip  Location Orientation: Right  Staging: Stage 2 -  Partial thickness loss of dermis presenting as a shallow open injury with a red, pink wound bed without slough.  Wound Description (Comments):   Present on Admission: Yes   DVT prophylaxis: heparin injection 5,000 Units Start: 08/05/21 1545 Code Status:   Code Status: Full Code Family Communication: plan of care discussed with patient/ no family at bedside.  PMT discussed with palliative care and they have updated family at bedside. I called her son [name] and updated re POC 6/3-hoping for PEG tube placement discharge hopefully by Wednesday or Thursday.  Patient status [id number] because of ongoing need for further procedure of PEG tube Level of care:Telemetry Medical   Dispo: The patient is from: [facility]            Anticipated disposition: Skilled  nursing facility once tolerating tube feeds after PEG placement and core track is removed  Mobility Assessment (last 72 hours)     Mobility Assessment     Row Name 08/16/21 2234 08/16/21 1400 08/16/21 1300 08/16/21 0741 08/15/21 2137   Does patient have an order for bedrest or is patient medically unstable No - Continue assessment -- -- No - Continue assessment No - Continue assessment   What is the highest level of mobility based on the progressive mobility assessment? Level 1 (Bedfast) - Unable to balance while sitting on edge of bed Level 1 (Bedfast) - Unable to balance while sitting on edge of bed Level 1 (Bedfast) - Unable to balance while sitting on edge of bed Level 1 (Bedfast) - Unable to balance while sitting on edge of bed Level 1 (Bedfast) - Unable to balance while sitting on edge of bed   Is the above level different from baseline mobility prior to current illness? No - Consider discontinuing PT/OT -- -- Yes - Recommend PT order No - Consider discontinuing PT/OT    Row Name 08/15/21 0745 08/15/21 0400 08/14/21 2000 08/14/21 1300 08/14/21 0900   Does patient have an order for bedrest or is patient medically unstable Yes- Bedfast (Level 1) - Complete Yes- Bedfast (Level 1) - Complete Yes- Bedfast (Level 1) - Complete -- Yes- Bedfast (Level 1) - Complete   What is the highest level of mobility based on the progressive mobility assessment? Level 1 (Bedfast) - Unable to balance while sitting on edge of bed Level 1 (Bedfast) - Unable to  balance while sitting on edge of bed Level 1 (Bedfast) - Unable to balance while sitting on edge of bed Level 1 (Bedfast) - Unable to balance while sitting on edge of bed Level 1 (Bedfast) - Unable to balance while sitting on edge of bed   Is the above level different from baseline mobility prior to current illness? Yes - Recommend PT order Yes - Recommend PT order Yes - Recommend PT order -- Yes - Recommend PT order             Objective: Vitals last 24  hrs: Vitals:   08/16/21 2121 08/17/21 0500 08/17/21 0534 08/17/21 0824  BP: 116/65  130/73 120/65  Pulse: (!) 101  92 90  Resp: '17  16 18  '$ Temp:   99.2 F (37.3 C) 98.8 F (37.1 C)  TempSrc:   Oral Oral  SpO2:   97% 90%  Weight:  82.1 kg     Weight change:   Physical Examination: General exam: AA, pleasant, not in distress, on nasal cannula older than stated age, weak appearing. HEENT:Oral mucosa moist, Ear/Nose WNL grossly, dentition normal. Respiratory system: bilaterally diminished, no use of accessory muscle Cardiovascular system: S1 & S2 +, No JVD,. Gastrointestinal system: Abdomen soft,NT,ND,BS+ Nervous System:Alert, awake, moving extremities and grossly nonfocal Extremities: LE ankle edema mild, distal peripheral pulses palpable.  Skin: No rashes,no icterus. MSK: Normal muscle bulk,tone, power   Medications reviewed:  Scheduled Meds:  atorvastatin  80 mg Per Tube Daily   escitalopram  10 mg Per Tube QHS   feeding supplement (PROSource TF)  45 mL Per Tube BID   folic acid  1 mg Per Tube Daily   free water  175 mL Per Tube Q4H   gabapentin  100 mg Per Tube Q12H   heparin  5,000 Units Subcutaneous Q8H   liver oil-zinc oxide   Topical QID   metoprolol tartrate  25 mg Per Tube BID   pantoprazole sodium  40 mg Per Tube Daily   phosphorus  250 mg Per Tube Daily   Continuous Infusions:  [START ON 08/20/2021]  ceFAZolin (ANCEF) IV     feeding supplement (JEVITY 1.5 CAL/FIBER) 1,000 mL (08/16/21 1323)    Pressure Injury 05/01/21 Sacrum Mid Stage 1 -  Intact skin with non-blanchable redness of a localized area usually over a bony prominence. (Active)  05/01/21 0400  Location: Sacrum  Location Orientation: Mid  Staging: Stage 1 -  Intact skin with non-blanchable redness of a localized area usually over a bony prominence.  Wound Description (Comments):   Present on Admission: Yes     Pressure Injury 05/08/21 Buttocks Left;Right Stage 2 -  Partial thickness loss of dermis  presenting as a shallow open injury with a red, pink wound bed without slough. (Active)  05/08/21 1951  Location: Buttocks  Location Orientation: Left;Right  Staging: Stage 2 -  Partial thickness loss of dermis presenting as a shallow open injury with a red, pink wound bed without slough.  Wound Description (Comments):   Present on Admission:      Pressure Injury 08/05/21 Buttocks Right Stage 2 -  Partial thickness loss of dermis presenting as a shallow open injury with a red, pink wound bed without slough. red, no drainage (Active)  08/05/21 1835  Location: Buttocks  Location Orientation: Right  Staging: Stage 2 -  Partial thickness loss of dermis presenting as a shallow open injury with a red, pink wound bed without slough.  Wound Description (Comments): red, no drainage  Present on Admission: Yes  Dressing Type Foam - Lift dressing to assess site every shift 08/16/21 2234     Pressure Injury 08/05/21 Hip Right Stage 2 -  Partial thickness loss of dermis presenting as a shallow open injury with a red, pink wound bed without slough. (Active)  08/05/21 1837  Location: Hip  Location Orientation: Right  Staging: Stage 2 -  Partial thickness loss of dermis presenting as a shallow open injury with a red, pink wound bed without slough.  Wound Description (Comments):   Present on Admission: Yes  Dressing Type Foam - Lift dressing to assess site every shift 08/16/21 0741   Diet Order             Diet NPO time specified Except for: Other (See Comments)  Diet effective midnight           Diet NPO time specified Except for: Ice Chips  Diet effective now                    Nutrition Problem: Moderate Malnutrition Etiology: chronic illness (dysphagia) Signs/Symptoms: mild muscle depletion, moderate muscle depletion, mild fat depletion, moderate fat depletion Interventions: Refer to RD note for recommendations   Intake/Output Summary (Last 24 hours) at 08/17/2021 0829 Last data filed  at 08/16/2021 1700 Gross per 24 hour  Intake --  Output 300 ml  Net -300 ml   Net IO Since Admission: 19,066.91 mL [08/17/21 0829]  Wt Readings from Last 3 Encounters:  08/17/21 82.1 kg  07/30/21 68 kg  07/29/21 68.6 kg     Unresulted Labs (From admission, onward)     Start     Ordered   08/20/21 0500  Protime-INR  Once,   R       Question:  Specimen collection method  Answer:  Lab=Lab collect   08/16/21 1622   08/08/21 0500  Renal function panel  Daily,   R     Question:  Specimen collection method  Answer:  Lab=Lab collect   08/07/21 1745          Data Reviewed: I have personally reviewed following labs and imaging studies CBC: No results for input(s): WBC, NEUTROABS, HGB, HCT, MCV, PLT in the last 168 hours.  Basic Metabolic Panel: Recent Labs  Lab 08/12/21 0229 08/13/21 0839 08/14/21 0407 08/15/21 0330 08/16/21 0257 08/17/21 0300  NA 140 141 140 138 138 138  K 4.5 4.3 4.2 4.2 4.0 3.9  CL 111 107 105 107 104 102  CO2 '25 28 29 27 28 29  '$ GLUCOSE 125* 134* 126* 127* 117* 119*  BUN 6* 6* 6* 7* 11 11  CREATININE 0.42* 0.42* 0.54 0.39* 0.53 0.46  CALCIUM 7.9* 8.4* 8.7* 8.8* 8.9 8.9  MG 2.0 1.8  --   --   --   --   PHOS 1.5* 3.5 2.1* 2.4* 3.2 3.5   GFR: Estimated Creatinine Clearance: 78.7 mL/min (by C-G formula based on SCr of 0.46 mg/dL). Liver Function Tests: Recent Labs  Lab 08/13/21 0839 08/14/21 0407 08/15/21 0330 08/16/21 0257 08/17/21 0300  ALBUMIN 2.4* 2.6* 2.3* 2.2* 2.2*   No results for input(s): LIPASE, AMYLASE in the last 168 hours. No results for input(s): AMMONIA in the last 168 hours. Coagulation Profile: No results for input(s): INR, PROTIME in the last 168 hours. BNP (last 3 results) No results for input(s): PROBNP in the last 8760 hours. HbA1C: No results for input(s): HGBA1C in the last 72 hours. CBG: Recent Labs  Lab 08/16/21 0539 08/16/21 2119 08/17/21 0045 08/17/21 0533 08/17/21 0822  GLUCAP 124* 140* 144* 126* 139*    Lipid Profile: No results for input(s): CHOL, HDL, LDLCALC, TRIG, CHOLHDL, LDLDIRECT in the last 72 hours. Thyroid Function Tests: No results for input(s): TSH, T4TOTAL, FREET4, T3FREE, THYROIDAB in the last 72 hours. Sepsis Labs: No results for input(s): PROCALCITON, LATICACIDVEN in the last 168 hours.  No results found for this or any previous visit (from the past 240 hour(s)).   Antimicrobials: Anti-infectives (From admission, onward)    Start     Dose/Rate Route Frequency Ordered Stop   08/20/21 0600  ceFAZolin (ANCEF) IVPB 2g/100 mL premix       Note to Pharmacy: Give in IR at time of Gastrostomy tube placement   2 g 200 mL/hr over 30 Minutes Intravenous On call 08/16/21 1620 08/21/21 0600   08/07/21 1400  meropenem (MERREM) 1 g in sodium chloride 0.9 % 100 mL IVPB        1 g 200 mL/hr over 30 Minutes Intravenous Every 8 hours 08/07/21 0915 08/14/21 1359   08/06/21 1200  vancomycin (VANCOREADY) IVPB 1500 mg/300 mL  Status:  Discontinued        1,500 mg 150 mL/hr over 120 Minutes Intravenous Every 24 hours 08/05/21 1107 08/07/21 1203   08/06/21 0400  meropenem (MERREM) 1 g in sodium chloride 0.9 % 100 mL IVPB  Status:  Discontinued        1 g 200 mL/hr over 30 Minutes Intravenous Every 12 hours 08/06/21 0241 08/07/21 0915   08/05/21 1932  vancomycin variable dose per unstable renal function (pharmacist dosing)  Status:  Discontinued         Does not apply See admin instructions 08/05/21 1933 08/07/21 0916   08/05/21 1923  ceFEPIme (MAXIPIME) 2 g in sodium chloride 0.9 % 100 mL IVPB  Status:  Discontinued        2 g 200 mL/hr over 30 Minutes Intravenous Every 12 hours 08/05/21 1923 08/06/21 0241   08/05/21 1100  ceFEPIme (MAXIPIME) 2 g in sodium chloride 0.9 % 100 mL IVPB  Status:  Discontinued        2 g 200 mL/hr over 30 Minutes Intravenous Every 8 hours 08/05/21 1056 08/05/21 1923   08/05/21 1100  vancomycin (VANCOREADY) IVPB 1500 mg/300 mL        1,500 mg 150 mL/hr over  120 Minutes Intravenous  Once 08/05/21 1056 08/05/21 1409      Culture/Microbiology    Component Value Date/Time   SDES BLOOD BLOOD LEFT WRIST 08/05/2021 1051   SPECREQUEST  08/05/2021 1051    BOTTLES DRAWN AEROBIC AND ANAEROBIC Blood Culture adequate volume   CULT (A) 08/05/2021 1051    ESCHERICHIA COLI Confirmed Extended Spectrum Beta-Lactamase Producer (ESBL).  In bloodstream infections from ESBL organisms, carbapenems are preferred over piperacillin/tazobactam. They are shown to have a lower risk of mortality.    REPTSTATUS 08/08/2021 FINAL 08/05/2021 1051  Radiology Studies: No results found.   LOS: 12 days   Antonieta Pert, MD [facility]  08/17/2021, 8:29 AM

## 2021-08-18 ENCOUNTER — Inpatient Hospital Stay (HOSPITAL_COMMUNITY): Payer: Medicare HMO

## 2021-08-18 LAB — RENAL FUNCTION PANEL
Albumin: 2.2 g/dL — ABNORMAL LOW (ref 3.5–5.0)
Anion gap: 4 — ABNORMAL LOW (ref 5–15)
BUN: 11 mg/dL (ref 8–23)
CO2: 27 mmol/L (ref 22–32)
Calcium: 8.6 mg/dL — ABNORMAL LOW (ref 8.9–10.3)
Chloride: 103 mmol/L (ref 98–111)
Creatinine, Ser: 0.48 mg/dL (ref 0.44–1.00)
GFR, Estimated: >60 mL/min (ref >60)
Glucose, Bld: 174 mg/dL — ABNORMAL HIGH (ref 70–99)
Phosphorus: 3.2 mg/dL (ref 2.5–4.6)
Potassium: 4.2 mmol/L (ref 3.5–5.1)
Sodium: 134 mmol/L — ABNORMAL LOW (ref 135–145)

## 2021-08-18 LAB — CBC
HCT: 28 % — ABNORMAL LOW (ref 36.0–46.0)
Hemoglobin: 8.9 g/dL — ABNORMAL LOW (ref 12.0–15.0)
MCH: 30 pg (ref 26.0–34.0)
MCHC: 31.8 g/dL (ref 30.0–36.0)
MCV: 94.3 fL (ref 80.0–100.0)
Platelets: 374 10*3/uL (ref 150–400)
RBC: 2.97 MIL/uL — ABNORMAL LOW (ref 3.87–5.11)
RDW: 16.4 % — ABNORMAL HIGH (ref 11.5–15.5)
WBC: 6.6 10*3/uL (ref 4.0–10.5)
nRBC: 0 % (ref 0.0–0.2)

## 2021-08-18 LAB — URINALYSIS, ROUTINE W REFLEX MICROSCOPIC
Bilirubin Urine: NEGATIVE
Glucose, UA: NEGATIVE mg/dL
Hgb urine dipstick: NEGATIVE
Ketones, ur: NEGATIVE mg/dL
Leukocytes,Ua: NEGATIVE
Nitrite: NEGATIVE
Protein, ur: 30 mg/dL — AB
Specific Gravity, Urine: 1.013 (ref 1.005–1.030)
pH: 6 (ref 5.0–8.0)

## 2021-08-18 LAB — GLUCOSE, CAPILLARY
Glucose-Capillary: 122 mg/dL — ABNORMAL HIGH (ref 70–99)
Glucose-Capillary: 150 mg/dL — ABNORMAL HIGH (ref 70–99)
Glucose-Capillary: 145 mg/dL — ABNORMAL HIGH (ref 70–99)

## 2021-08-18 MED ORDER — FUROSEMIDE 10 MG/ML IJ SOLN
40.0000 mg | INTRAMUSCULAR | Status: AC
  Administered 2021-08-18: 40 mg via INTRAVENOUS
  Filled 2021-08-18: qty 4

## 2021-08-18 MED ORDER — ACETAMINOPHEN 160 MG/5ML PO SOLN
650.0000 mg | ORAL | Status: AC
Start: 2021-08-18 — End: 2021-08-18
  Administered 2021-08-18: 650 mg
  Filled 2021-08-18: qty 20.3

## 2021-08-18 MED ORDER — PIPERACILLIN-TAZOBACTAM 3.375 G IVPB
3.3750 g | Freq: Three times a day (TID) | INTRAVENOUS | Status: DC
  Administered 2021-08-18 – 2021-08-19 (×2): 3.375 g via INTRAVENOUS
  Filled 2021-08-18 (×2): qty 50

## 2021-08-18 NOTE — Progress Notes (Addendum)
08/18/2021 Patient temperature on 101.4 axillary, Dr Antonieta Pert was made aware via amion Patient temperature was rechecked it was 101.1 order was put in for Tylenol solution. Patient heart rate was sustaining 138 to 140. She also continue to have thick secretion and not able to cough it up. RN was given order for lasix 40 mg times one, a UA was order and sent to lab, Blood cultures was also ordered and patient was started on Zosyn. Rapid response was made aware of patient condition. Rylyn Ranganathan Insurance claims handler.

## 2021-08-18 NOTE — Progress Notes (Signed)
Pharmacy Antibiotic Note  LIZZIE AN is a 61 y.o. female admitted on 08/05/2021 with aspiration. Pharmacy has been consulted for Zosyn dosing. Pt recently completed meropenem.  Plan: Zosyn 3.375g IV EI q8h  Weight: 82.1 kg (181 lb)  Temp (24hrs), Avg:99.7 F (37.6 C), Min:98.2 F (36.8 C), Max:101.4 F (38.6 C)  Recent Labs  Lab 08/14/21 0407 08/15/21 0330 08/16/21 0257 08/17/21 0300 08/18/21 0221 08/18/21 0901  WBC  --   --   --   --   --  6.6  CREATININE 0.54 0.39* 0.53 0.46 0.48  --     Estimated Creatinine Clearance: 78.7 mL/min (by C-G formula based on SCr of 0.48 mg/dL).    Allergies  Allergen Reactions   Apremilast Other (See Comments)    unknown   Latex Itching, Swelling and Rash     Arrie Senate, PharmD, BCPS, [facility] Clinical Pharmacist [phone] Please check AMION for all [place] numbers 08/18/2021

## 2021-08-18 NOTE — Progress Notes (Signed)
PROGRESS NOTE JUSTISS GERBINO  XLK:[id number] DOB: 09/18/1960 DOA: 08/05/2021 PCP: Leeroy Cha, MD   Brief Narrative/Hospital Course: 61 y.o. fw/ history significant of failure to thrive, dysphagia secondary to moderate esophageal dysmotility (barium swallow March 2023), psoriasis arthritis, CAD s/p CABG, DM-2, chronic systolic CHF LVEF 36%, was sent to hospital for evaluation of altered mentations hypoxia and fever. Recently hospitalized and discharged to nursing home 11 days PTA after treatment of supposed empyema and pneumonia. Patient has established history of failure to thrive and has been losing weight, and according to the record she lost about 13 kg in last 65-month.  Per report she was discharged on soft diet, last week, her diet was downgraded to pured. Despite, family observed several times patient had choking episode after eating for food. In ED- hypoxic initially briefly on 15 L of nonrebreather and titrated down to 5 L, tachycardia hypotensive but responded to IV fluid.  Febrile 101.4 CTAPE-negative for PE but patchy infiltrates on bilateral lower lung fields suspicious for pneumonia.Small right-sided pleural effusion. Patient is admitted for severe sepsis secondary to aspiration pneumonia and Bacteremia with ESBL E Coli -being managed with  Meropenem-total course 7 days, placed on Cortrak for feeding. Seen by GI who felt nothing further to add in terms of management- and if agreeable consider PEG. Seen by palliative care and have had multiple conversations with patient  and family.they wanted to proceed with PEG tube placement, IR consulted, aspirin on hold for PEG tube on 6/6    Subjective: Seen and examined this morning complains of mild cough no other complaint no dysuria Overnight she had low-grade temp 100.7  BP stable Labs came back with normal WBC count  Assessment and Plan: Principal Problem:   Sepsis ([facility] Active Problems:   Rheumatoid arthritis (HCC)    Gastroesophageal reflux disease without esophagitis   AMS (altered mental status)   Hypokalemia   Hypernatremia   Pressure injury of skin   Malnutrition of moderate degree   AKI (acute kidney injury) ([place]   Adult failure to thrive syndrome   Dysphagia   Chronic systolic CHF (congestive heart failure) (HCC)   Infection due to ESBL-producing Escherichia coli   Hypophosphatemia   Folate deficiency  Severe sepsis due to ESBL E. coli bacteremia Aspiration pneumonia Possible UTI Acute respiratory with hypoxia/lactic acidosis: Blood culture ESBL E. coli seen by ID. Completed 7 days meropenem  08/14/21. Sepsis resolved.  Remains hemodynamically stable.  Had low-grade temperature overnight-monitor no obvious respiratory symptoms or urinary symptoms.  If recurs-we will check chest x-ray and UA.  Continue with aspiration precaution.  Failure to thrive Dysphagia-esophageal dysmotility Severe protein calorie malnutrition POA: Patient esophageal dysmotility noted to have had swallowing problems, seen by speech back in March with inadequate nutrition severe hypoalbuminemia.  Getting tube feed with core track, dietitian following, PMT following, seen by GI-EGD would not improve dysmotility or decrease of aspiration pneumonia and will not change her discharge.IR consulted for PEG tube placement.  CT scan anatomy amenable for PEG tube per IR.IR advised to hold aspirin x5 days-and planning for PEG tube placement 6/6, hold tube feeds Monday night.  Hypertension Chronic systolic CHF: BP well controlled, continue metoprolol monitor fluid status-CHF is compensated. Net IO Since Admission: 19,491.91 mL [08/18/21 0834] Weight 164 on 5/27- 181 6/3.  Check BNP, chest x-ray-possible - right small right pleural effusion diffuse asymmetric right lung airspace disease increased -s/p Lasix 20 mg x1.  Stable volume status  Acute metabolic encephalopathy in the setting of  sepsis: Overall condition doing well.   AKI:  Creatinine has improved.  Continue enteral nutrition.  Monitor renal function Hyperkalemia resolved Hypokalemia/hypophosphatemia: Resolved.  S/p Neutra-Phos supplement. GERD continue PPI  Anemia/folate deficiency without overt bleeding monitor H&H transfuse for less than 7 g.Anemia panel with iron level of 19, folate of 4.0, ferritin at 1850, TIBC not calculated.  Vitamin B12 levels of 421.  Pseudohypocalcemia correctedcalcium stable  Psoriasis arthritis: On outpatient follow-up with rheumatology  Pressure injury POA as below. Pressure Injury 08/05/21 Buttocks Right Stage 2 -  Partial thickness loss of dermis presenting as a shallow open injury with a red, pink wound bed without slough. red, no drainage (Active)  08/05/21 1835  Location: Buttocks  Location Orientation: Right  Staging: Stage 2 -  Partial thickness loss of dermis presenting as a shallow open injury with a red, pink wound bed without slough.  Wound Description (Comments): red, no drainage  Present on Admission: Yes     Pressure Injury 08/05/21 Hip Right Stage 2 -  Partial thickness loss of dermis presenting as a shallow open injury with a red, pink wound bed without slough. (Active)  08/05/21 1837  Location: Hip  Location Orientation: Right  Staging: Stage 2 -  Partial thickness loss of dermis presenting as a shallow open injury with a red, pink wound bed without slough.  Wound Description (Comments):   Present on Admission: Yes   DVT prophylaxis: heparin injection 5,000 Units Start: 08/05/21 1545 Code Status:   Code Status: Full Code Family Communication: plan of care discussed with patient/ no family at bedside.  PMT discussed with palliative care and they have updated family at bedside. I called her son Evanthia Maund and updated re POC 6/3-hoping for PEG tube placement discharge hopefully by Wednesday or Thursday.  Patient status DG:[id number] because of ongoing need for further procedure of PEG tube Level of  care:Telemetry Medical   Dispo: The patient is from: [place]            Anticipated disposition:  SNF tolerating tube feeds after PEG placement and core track is removed  Mobility Assessment (last 72 hours)     Mobility Assessment     Row Name 08/17/21 0950 08/16/21 2234 08/16/21 1400 08/16/21 1300 08/16/21 0741   Does patient have an order for bedrest or is patient medically unstable No - Continue assessment No - Continue assessment -- -- No - Continue assessment   What is the highest level of mobility based on the progressive mobility assessment? -- Level 1 (Bedfast) - Unable to balance while sitting on edge of bed Level 1 (Bedfast) - Unable to balance while sitting on edge of bed Level 1 (Bedfast) - Unable to balance while sitting on edge of bed Level 1 (Bedfast) - Unable to balance while sitting on edge of bed   Is the above level different from baseline mobility prior to current illness? Yes - Recommend PT order No - Consider discontinuing PT/OT -- -- Yes - Recommend PT order    Row Name 08/15/21 2137           Does patient have an order for bedrest or is patient medically unstable No - Continue assessment       What is the highest level of mobility based on the progressive mobility assessment? Level 1 (Bedfast) - Unable to balance while sitting on edge of bed       Is the above level different from baseline mobility prior to current illness?  No - Consider discontinuing PT/OT                 Objective: Vitals last 24 hrs: Vitals:   08/17/21 0824 08/17/21 1915 08/18/21 0503 08/18/21 0733  BP: 120/65 (!) 136/91 128/74 121/71  Pulse: 90 96  98  Resp: '18 20 20   '$ Temp: 98.8 F (37.1 C) 98.2 F (36.8 C) (!) 100.7 F (38.2 C) 98.5 F (36.9 C)  TempSrc: Oral Oral Axillary Oral  SpO2: 90% 94% 97% 95%  Weight:       Weight change:   Physical Examination: General exam: AA, pleasant and conversant older than stated age, weak appearing. HEENT:Oral  mucosa moist, Ear/Nose WNL grossly, dentition normal. Respiratory system:crackles in the right lower base,no use of accessory muscle Cardiovascular system: S1 & S2 +, No JVD,. Gastrointestinal system: Abdomen soft,NT,ND,BS+ Nervous System:Alert, awake, moving extremities and grossly nonfocal Extremities: LE ankle edema mild, distal peripheral pulses palpable.  Skin: No rashes,no icterus. MSK: Normal muscle bulk,tone, power  Cortak+  Medications reviewed:  Scheduled Meds:  atorvastatin  80 mg Per Tube Daily   escitalopram  10 mg Per Tube QHS   feeding supplement (PROSource TF)  45 mL Per Tube BID   folic acid  1 mg Per Tube Daily   free water  175 mL Per Tube Q4H   gabapentin  100 mg Per Tube Q12H   heparin  5,000 Units Subcutaneous Q8H   liver oil-zinc oxide   Topical QID   metoprolol tartrate  25 mg Per Tube BID   pantoprazole sodium  40 mg Per Tube Daily   phosphorus  250 mg Per Tube Daily   Continuous Infusions:  [START ON 08/20/2021]  ceFAZolin (ANCEF) IV     feeding supplement (JEVITY 1.5 CAL/FIBER) 1,000 mL (08/17/21 1445)    Pressure Injury 05/01/21 Sacrum Mid Stage 1 -  Intact skin with non-blanchable redness of a localized area usually over a bony prominence. (Active)  05/01/21 0400  Location: Sacrum  Location Orientation: Mid  Staging: Stage 1 -  Intact skin with non-blanchable redness of a localized area usually over a bony prominence.  Wound Description (Comments):   Present on Admission: Yes     Pressure Injury 05/08/21 Buttocks Left;Right Stage 2 -  Partial thickness loss of dermis presenting as a shallow open injury with a red, pink wound bed without slough. (Active)  05/08/21 1951  Location: Buttocks  Location Orientation: Left;Right  Staging: Stage 2 -  Partial thickness loss of dermis presenting as a shallow open injury with a red, pink wound bed without slough.  Wound Description (Comments):   Present on Admission:      Pressure Injury 08/05/21 Buttocks  Right Stage 2 -  Partial thickness loss of dermis presenting as a shallow open injury with a red, pink wound bed without slough. red, no drainage (Active)  08/05/21 1835  Location: Buttocks  Location Orientation: Right  Staging: Stage 2 -  Partial thickness loss of dermis presenting as a shallow open injury with a red, pink wound bed without slough.  Wound Description (Comments): red, no drainage  Present on Admission: Yes  Dressing Type Foam - Lift dressing to assess site every shift 08/17/21 2107     Pressure Injury 08/05/21 Hip Right Stage 2 -  Partial thickness loss of dermis presenting as a shallow open injury with a red, pink wound bed without slough. (Active)  08/05/21 1837  Location: Hip  Location Orientation: Right  Staging: Stage 2 -  Partial thickness loss of dermis presenting as a shallow open injury with a red, pink wound bed without slough.  Wound Description (Comments):   Present on Admission: Yes  Dressing Type Foam - Lift dressing to assess site every shift 08/17/21 2107   Diet Order             Diet NPO time specified Except for: Other (See Comments)  Diet effective midnight           Diet NPO time specified Except for: Ice Chips  Diet effective now                    Nutrition Problem: Moderate Malnutrition Etiology: chronic illness (dysphagia) Signs/Symptoms: mild muscle depletion, moderate muscle depletion, mild fat depletion, moderate fat depletion Interventions: Refer to RD note for recommendations   Intake/Output Summary (Last 24 hours) at 08/18/2021 0834 Last data filed at 08/18/2021 0500 Gross per 24 hour  Intake 1025 ml  Output 600 ml  Net 425 ml    Net IO Since Admission: 19,491.91 mL [08/18/21 0834]  Wt Readings from Last 3 Encounters:  08/17/21 82.1 kg  07/30/21 68 kg  07/29/21 68.6 kg     Unresulted Labs (From admission, onward)     Start     Ordered   08/20/21 0500  Protime-INR  Once,   R       Question:  Specimen collection method   Answer:  Lab=Lab collect   08/16/21 1622   08/08/21 0500  Renal function panel  Daily,   R     Question:  Specimen collection method  Answer:  Lab=Lab collect   08/07/21 1745          Data Reviewed: I have personally reviewed following labs and imaging studies CBC: No results for input(s): WBC, NEUTROABS, HGB, HCT, MCV, PLT in the last 168 hours.  Basic Metabolic Panel: Recent Labs  Lab 08/12/21 0229 08/13/21 0839 08/14/21 0407 08/15/21 0330 08/16/21 0257 08/17/21 0300 08/18/21 0221  NA 140 141 140 138 138 138 134*  K 4.5 4.3 4.2 4.2 4.0 3.9 4.2  CL 111 107 105 107 104 102 103  CO2 '25 28 29 27 28 29 27  '$ GLUCOSE 125* 134* 126* 127* 117* 119* 174*  BUN 6* 6* 6* 7* '11 11 11  '$ CREATININE 0.42* 0.42* 0.54 0.39* 0.53 0.46 0.48  CALCIUM 7.9* 8.4* 8.7* 8.8* 8.9 8.9 8.6*  MG 2.0 1.8  --   --   --   --   --   PHOS 1.5* 3.5 2.1* 2.4* 3.2 3.5 3.2    GFR: Estimated Creatinine Clearance: 78.7 mL/min (by C-G formula based on SCr of 0.48 mg/dL). Liver Function Tests: Recent Labs  Lab 08/14/21 0407 08/15/21 0330 08/16/21 0257 08/17/21 0300 08/18/21 0221  ALBUMIN 2.6* 2.3* 2.2* 2.2* 2.2*    No results for input(s): LIPASE, AMYLASE in the last 168 hours. No results for input(s): AMMONIA in the last 168 hours. Coagulation Profile: No results for input(s): INR, PROTIME in the last 168 hours. BNP (last 3 results) No results for input(s): PROBNP in the last 8760 hours. HbA1C: No results for input(s): HGBA1C in the last 72 hours. CBG: Recent Labs  Lab 08/17/21 0045 08/17/21 0533 08/17/21 0822 08/17/21 2054 08/18/21 0523  GLUCAP 144* 126* 139* 86 122*    Lipid Profile: No results for input(s): CHOL, HDL, LDLCALC, TRIG, CHOLHDL, LDLDIRECT in the last 72 hours. Thyroid Function Tests: No results for input(s): TSH, T4TOTAL, FREET4, T3FREE,  THYROIDAB in the last 72 hours. Sepsis Labs: No results for input(s): PROCALCITON, LATICACIDVEN in the last 168 hours.  No results  found for this or any previous visit (from the past 240 hour(s)).   Antimicrobials: Anti-infectives (From admission, onward)    Start     Dose/Rate Route Frequency Ordered Stop   08/20/21 0600  ceFAZolin (ANCEF) IVPB 2g/100 mL premix       Note to Pharmacy: Give in IR at time of Gastrostomy tube placement   2 g 200 mL/hr over 30 Minutes Intravenous On call 08/16/21 1620 08/21/21 0600   08/07/21 1400  meropenem (MERREM) 1 g in sodium chloride 0.9 % 100 mL IVPB        1 g 200 mL/hr over 30 Minutes Intravenous Every 8 hours 08/07/21 0915 08/14/21 1359   08/06/21 1200  vancomycin (VANCOREADY) IVPB 1500 mg/300 mL  Status:  Discontinued        1,500 mg 150 mL/hr over 120 Minutes Intravenous Every 24 hours 08/05/21 1107 08/07/21 1203   08/06/21 0400  meropenem (MERREM) 1 g in sodium chloride 0.9 % 100 mL IVPB  Status:  Discontinued        1 g 200 mL/hr over 30 Minutes Intravenous Every 12 hours 08/06/21 0241 08/07/21 0915   08/05/21 1932  vancomycin variable dose per unstable renal function (pharmacist dosing)  Status:  Discontinued         Does not apply See admin instructions 08/05/21 1933 08/07/21 0916   08/05/21 1923  ceFEPIme (MAXIPIME) 2 g in sodium chloride 0.9 % 100 mL IVPB  Status:  Discontinued        2 g 200 mL/hr over 30 Minutes Intravenous Every 12 hours 08/05/21 1923 08/06/21 0241   08/05/21 1100  ceFEPIme (MAXIPIME) 2 g in sodium chloride 0.9 % 100 mL IVPB  Status:  Discontinued        2 g 200 mL/hr over 30 Minutes Intravenous Every 8 hours 08/05/21 1056 08/05/21 1923   08/05/21 1100  vancomycin (VANCOREADY) IVPB 1500 mg/300 mL        1,500 mg 150 mL/hr over 120 Minutes Intravenous  Once 08/05/21 1056 08/05/21 1409      Culture/Microbiology    Component Value Date/Time   SDES BLOOD BLOOD LEFT WRIST 08/05/2021 1051   SPECREQUEST  08/05/2021 1051    BOTTLES DRAWN AEROBIC AND ANAEROBIC Blood Culture adequate volume   CULT (A) 08/05/2021 1051    ESCHERICHIA COLI Confirmed  Extended Spectrum Beta-Lactamase Producer (ESBL).  In bloodstream infections from ESBL organisms, carbapenems are preferred over piperacillin/tazobactam. They are shown to have a lower risk of mortality.    REPTSTATUS 08/08/2021 FINAL 08/05/2021 1051  Radiology Studies: [facility] Chest Port 1 View  Result Date: 08/17/2021 CLINICAL DATA:  Congestive heart failure.  Coronary artery disease. EXAM: PORTABLE CHEST 1 VIEW COMPARISON:  08/06/2021 FINDINGS: Stable cardiomegaly. Prior CABG. New feeding tube is seen with tip overlying the proximal stomach. Diffuse asymmetric right lung airspace disease is increased since previous study. Stable small right pleural effusion. Mild atelectasis or scarring in retrocardiac lung base is also unchanged. IMPRESSION: Increased diffuse asymmetric right lung airspace disease, which may be due to infection or asymmetric edema. Stable small right pleural effusion. New feeding tube, with tip overlying the proximal stomach. Electronically Signed   By: Marlaine Hind M.D.   On: 08/17/2021 09:33     LOS: 13 days   Antonieta Pert, MD [facility]  08/18/2021, 8:34 AM

## 2021-08-19 ENCOUNTER — Inpatient Hospital Stay (HOSPITAL_COMMUNITY): Payer: Medicare HMO

## 2021-08-19 LAB — CBC
HCT: 30.2 % — ABNORMAL LOW (ref 36.0–46.0)
Hemoglobin: 9.8 g/dL — ABNORMAL LOW (ref 12.0–15.0)
MCH: 30.6 pg (ref 26.0–34.0)
MCHC: 32.5 g/dL (ref 30.0–36.0)
MCV: 94.4 fL (ref 80.0–100.0)
Platelets: 385 10*3/uL (ref 150–400)
RBC: 3.2 MIL/uL — ABNORMAL LOW (ref 3.87–5.11)
RDW: 16.2 % — ABNORMAL HIGH (ref 11.5–15.5)
WBC: 9.7 10*3/uL (ref 4.0–10.5)
nRBC: 0 % (ref 0.0–0.2)

## 2021-08-19 LAB — GLUCOSE, CAPILLARY
Glucose-Capillary: 91 mg/dL (ref 70–99)
Glucose-Capillary: 98 mg/dL (ref 70–99)
Glucose-Capillary: 71 mg/dL (ref 70–99)
Glucose-Capillary: 73 mg/dL (ref 70–99)

## 2021-08-19 LAB — RENAL FUNCTION PANEL
Albumin: 2.1 g/dL — ABNORMAL LOW (ref 3.5–5.0)
Anion gap: 10 (ref 5–15)
BUN: 12 mg/dL (ref 8–23)
CO2: 28 mmol/L (ref 22–32)
Calcium: 8.9 mg/dL (ref 8.9–10.3)
Chloride: 100 mmol/L (ref 98–111)
Creatinine, Ser: 0.53 mg/dL (ref 0.44–1.00)
GFR, Estimated: >60 mL/min (ref >60)
Glucose, Bld: 104 mg/dL — ABNORMAL HIGH (ref 70–99)
Phosphorus: 3.7 mg/dL (ref 2.5–4.6)
Potassium: 4.3 mmol/L (ref 3.5–5.1)
Sodium: 138 mmol/L (ref 135–145)

## 2021-08-19 MED ORDER — FUROSEMIDE 10 MG/ML IJ SOLN
40.0000 mg | Freq: Two times a day (BID) | INTRAMUSCULAR | Status: AC
  Administered 2021-08-19 (×2): 40 mg via INTRAVENOUS
  Filled 2021-08-19: qty 4

## 2021-08-19 MED ORDER — FUROSEMIDE 10 MG/ML IJ SOLN
40.0000 mg | Freq: Once | INTRAMUSCULAR | Status: DC
Start: 2021-08-19 — End: 2021-08-19

## 2021-08-19 MED ORDER — SODIUM CHLORIDE 0.9 % IV SOLN
1.0000 g | Freq: Three times a day (TID) | INTRAVENOUS | Status: AC
  Administered 2021-08-19 – 2021-08-23 (×12): 1 g via INTRAVENOUS
  Filled 2021-08-19 (×12): qty 20

## 2021-08-19 MED ORDER — FUROSEMIDE 10 MG/ML IJ SOLN
INTRAMUSCULAR | Status: AC
  Filled 2021-08-19: qty 4

## 2021-08-19 NOTE — Progress Notes (Signed)
PROGRESS NOTE [name] [name]  NGE:[id number] DOB: 1960/09/04 DOA: 08/05/2021 PCP: Leeroy Cha, MD   Brief Narrative/Hospital Course: 61 y.o. fw/ history significant of failure to thrive, dysphagia secondary to moderate esophageal dysmotility (barium swallow March 2023), psoriasis arthritis, CAD s/p CABG, DM-2, chronic systolic CHF LVEF 40%, was sent to hospital for evaluation of altered mentations hypoxia and fever. Recently hospitalized and discharged to nursing home 11 days PTA after treatment of supposed empyema and pneumonia. Patient has established history of failure to thrive and has been losing weight, and according to the record she lost about 13 kg in last 47-month.  Per report she was discharged on soft diet, last week, her diet was downgraded to pured. Despite, family observed several times patient had choking episode after eating for food. In ED- hypoxic initially briefly on 15 L of nonrebreather and titrated down to 5 L, tachycardia hypotensive but responded to IV fluid.  Febrile 101.4 CTAPE-negative for PE but patchy infiltrates on bilateral lower lung fields suspicious for pneumonia.Small right-sided pleural effusion. Patient is admitted for severe sepsis secondary to aspiration pneumonia and Bacteremia with ESBL E Coli -being managed with  Meropenem-total course 7 days, placed on Cortrak for feeding. Seen by GI who felt nothing further to add in terms of management- and if agreeable consider PEG. Seen by palliative care and have had multiple conversations with patient  and family.they wanted to proceed with PEG tube placement, IR consulted, aspirin on hold for PEG tube on 6/6    Subjective: Seen and examined this morning  Seen resting comfortably alert awake oriented to place, denies shortness of breath cough dysuria.  She is on 4 L nasal cannula.  Able to cough up. Patient had episode of fever, increased secretion, increased oxygen 6/4 evening-chest x-ray was done 6/4- In  evening suctioning provided, along with Tylenol and Lasix iv x 1, placed on IV antibiotic regimen after blood culture.  Since then no recurrence of fever tachycardia resolved This morning labs shows stable CBC BMP  Assessment and Plan: Principal Problem:   Sepsis ([place] Active Problems:   Rheumatoid arthritis (HCC)   Gastroesophageal reflux disease without esophagitis   AMS (altered mental status)   Hypokalemia   Hypernatremia   Pressure injury of skin   Malnutrition of moderate degree   AKI (acute kidney injury) ([place]   Adult failure to thrive syndrome   Dysphagia   Chronic systolic CHF (congestive heart failure) (HCC)   Infection due to ESBL-producing Escherichia coli   Hypophosphatemia   Folate deficiency  Severe sepsis due to ESBL E. coli bacteremia Aspiration pneumonia Possible UTI Acute on chronic respiratory with hypoxia Fever episodes 6/5-multifocal pneumonia on chest x-ray versus pulm edema: Blood culture ESBL E. coli seen by ID. Completed 7 days meropenem  08/14/21.Sepsis resolved.  Patient had thick secretion episodes of fever along with tachycardia -given IV Lasix Tylenol fever improved.  Blood culture sent, placed on Zosyn but will empirically switching to meropenem pending further blood culture given patient's ESBL bacteremia.Continue pulmonary support bronchodilators, repeat chest x-ray- shows bilateral interstitial and patchy alveolar airspace opacities right greater than left differential concerns include pulmonary edema versus multilobar pneumonia. We will do lasix 40 mg bid- repeat cxr in am  Failure to thrive Dysphagia-esophageal dysmotility Severe protein calorie malnutrition POA: Patient esophageal dysmotility noted to have had swallowing problems, seen by speech back in March with inadequate nutrition severe hypoalbuminemia.  Getting tube feed with core track, dietitian following, PMT following, seen by GI-EGD would not  improve dysmotility or decrease of  aspiration pneumonia and will not change her discharge.IR consulted for PEG tube placement.  CT scan anatomy amenable for PEG tube per IR.IR advised to hold aspirin x5 days-and planning for PEG tube placement 6/6, hold tube feeds Monday night.  Hypertension Acute on chronic systolic CHF: BP well controlled.  Given Lasix IV as per 1. e to patient's respiratory status/x-ray changes.Monitor intake output balance although had significant net positive balance unclear if it is accurate  Net IO Since Admission: 18,816.91 mL [08/19/21 1011] Weight 164 on 5/27- 181 6/3> 171 lb.  We will repeat chest x-ray and will keep next Lasix as needed.  Acute metabolic encephalopathy in the setting of sepsis: Mentation is stable.   AKI: Resolved  Hyperkalemia resolved Hypokalemia/hypophosphatemia: Resolved.  S/p Neutra-Phos supplement. GERD continue PPI  Anemia/folate deficiency without overt bleeding monitor H&H transfuse for less than 7 g.Anemia panel with iron level of 19, folate of 4.0, ferritin at 1850, TIBC not calculated.  Vitamin B12 levels of 421.  Monitor Recent Labs  Lab 08/18/21 0901 08/19/21 0101  HGB 8.9* 9.8*  HCT 28.0* 30.2*     Pseudohypocalcemia correctedcalcium stable  Psoriasis arthritis: On outpatient follow-up with rheumatology  Pressure injury POA as below. Pressure Injury 08/05/21 Buttocks Right Stage 2 -  Partial thickness loss of dermis presenting as a shallow open injury with a red, pink wound bed without slough. red, no drainage (Active)  08/05/21 1835  Location: Buttocks  Location Orientation: Right  Staging: Stage 2 -  Partial thickness loss of dermis presenting as a shallow open injury with a red, pink wound bed without slough.  Wound Description (Comments): red, no drainage  Present on Admission: Yes     Pressure Injury 08/05/21 Hip Right Stage 2 -  Partial thickness loss of dermis presenting as a shallow open injury with a red, pink wound bed without slough. (Active)   08/05/21 1837  Location: Hip  Location Orientation: Right  Staging: Stage 2 -  Partial thickness loss of dermis presenting as a shallow open injury with a red, pink wound bed without slough.  Wound Description (Comments):   Present on Admission: Yes   DVT prophylaxis: heparin injection 5,000 Units Start: 08/05/21 1545 Code Status:   Code Status: Full Code Family Communication: plan of care discussed with patient/ no family at bedside.  PMT discussed with palliative care and they have updated family at bedside. I called her son Porchea Charrier and updated re POC 6/3-hoping for PEG tube placement discharge hopefully by Wednesday or Thursday.  Patient status DP:[id number] because of ongoing need for further procedure of PEG tube Level of care:Telemetry Medical   Dispo: The patient is from: [place]            Anticipated disposition:  SNF tolerating tube feeds after PEG placement and core track is removed  Mobility Assessment (last 72 hours)     Mobility Assessment     Row Name 08/18/21 1116 08/17/21 0950 08/16/21 2234 08/16/21 1400 08/16/21 1300   Does patient have an order for bedrest or is patient medically unstable No - Continue assessment No - Continue assessment No - Continue assessment -- --   What is the highest level of mobility based on the progressive mobility assessment? -- -- Level 1 (Bedfast) - Unable to balance while sitting on edge of bed Level 1 (Bedfast) - Unable to balance while sitting on edge of bed Level 1 (Bedfast) - Unable to balance while  sitting on edge of bed   Is the above level different from baseline mobility prior to current illness? Yes - Recommend PT order Yes - Recommend PT order No - Consider discontinuing PT/OT -- --             Objective: Vitals last 24 hrs: Vitals:   08/19/21 0435 08/19/21 0445 08/19/21 0500 08/19/21 0900  BP: 134/85  134/85 125/69  Pulse:   91 60  Resp: '15  15 20  '$ Temp: 99 F (37.2 C)  99 F (37.2  C) 98.6 F (37 C)  TempSrc: Axillary  Axillary Oral  SpO2:   99% 97%  Weight:  77.6 kg     Weight change:   Physical Examination: General exam: AA, at baseline older than stated age, weak appearing. HEENT:Oral mucosa moist, Ear/Nose WNL grossly, dentition normal. Respiratory system: Basal crackles bilateral lung , on 4l [place]yer no use of accessory muscle Cardiovascular system: S1 & S2 +, No JVD,. Gastrointestinal system: Abdomen soft,NT,ND, BS+ Nervous System:Alert, awake, moving extremities and grossly nonfocal Extremities: edema trace on leg,distal peripheral pulses palpable.  Skin: No rashes,no icterus. MSK: Normal muscle bulk,tone, power Cortrak- NGT+  Medications reviewed:  Scheduled Meds:  atorvastatin  80 mg Per Tube Daily   escitalopram  10 mg Per Tube QHS   feeding supplement (PROSource TF)  45 mL Per Tube BID   folic acid  1 mg Per Tube Daily   free water  175 mL Per Tube Q4H   furosemide  40 mg Intravenous Once   gabapentin  100 mg Per Tube Q12H   heparin  5,000 Units Subcutaneous Q8H   liver oil-zinc oxide   Topical QID   metoprolol tartrate  25 mg Per Tube BID   pantoprazole sodium  40 mg Per Tube Daily   phosphorus  250 mg Per Tube Daily   Continuous Infusions:  [START ON 08/20/2021]  ceFAZolin (ANCEF) IV     feeding supplement (JEVITY 1.5 CAL/FIBER) 1,000 mL (08/18/21 1111)   meropenem (MERREM) IV      Pressure Injury 05/01/21 Sacrum Mid Stage 1 -  Intact skin with non-blanchable redness of a localized area usually over a bony prominence. (Active)  05/01/21 0400  Location: Sacrum  Location Orientation: Mid  Staging: Stage 1 -  Intact skin with non-blanchable redness of a localized area usually over a bony prominence.  Wound Description (Comments):   Present on Admission: Yes     Pressure Injury 05/08/21 Buttocks Left;Right Stage 2 -  Partial thickness loss of dermis presenting as a shallow open injury with a red, pink wound bed without slough. (Active)   05/08/21 1951  Location: Buttocks  Location Orientation: Left;Right  Staging: Stage 2 -  Partial thickness loss of dermis presenting as a shallow open injury with a red, pink wound bed without slough.  Wound Description (Comments):   Present on Admission:      Pressure Injury 08/05/21 Buttocks Right Stage 2 -  Partial thickness loss of dermis presenting as a shallow open injury with a red, pink wound bed without slough. red, no drainage (Active)  08/05/21 1835  Location: Buttocks  Location Orientation: Right  Staging: Stage 2 -  Partial thickness loss of dermis presenting as a shallow open injury with a red, pink wound bed without slough.  Wound Description (Comments): red, no drainage  Present on Admission: Yes  Dressing Type Foam - Lift dressing to assess site every shift 08/18/21 1116     Pressure Injury 08/05/21  Hip Right Stage 2 -  Partial thickness loss of dermis presenting as a shallow open injury with a red, pink wound bed without slough. (Active)  08/05/21 1837  Location: Hip  Location Orientation: Right  Staging: Stage 2 -  Partial thickness loss of dermis presenting as a shallow open injury with a red, pink wound bed without slough.  Wound Description (Comments):   Present on Admission: Yes  Dressing Type Foam - Lift dressing to assess site every shift 08/17/21 2107   Diet Order             Diet NPO time specified Except for: Other (See Comments)  Diet effective midnight           Diet NPO time specified Except for: Ice Chips  Diet effective now                    Nutrition Problem: Moderate Malnutrition Etiology: chronic illness (dysphagia) Signs/Symptoms: mild muscle depletion, moderate muscle depletion, mild fat depletion, moderate fat depletion Interventions: Refer to RD note for recommendations   Intake/Output Summary (Last 24 hours) at 08/19/2021 1011 Last data filed at 08/19/2021 0430 Gross per 24 hour  Intake 50 ml  Output 725 ml  Net -675 ml   Net  IO Since Admission: 18,816.91 mL [08/19/21 1011]  Wt Readings from Last 3 Encounters:  08/19/21 77.6 kg  07/30/21 68 kg  07/29/21 68.6 kg     Unresulted Labs (From admission, onward)     Start     Ordered   08/20/21 0500  Protime-INR  Once,   R       Question:  Specimen collection method  Answer:  Lab=Lab collect   08/16/21 1622   08/08/21 0500  Renal function panel  Daily,   R     Question:  Specimen collection method  Answer:  Lab=Lab collect   08/07/21 1745          Data Reviewed: I have personally reviewed following labs and imaging studies CBC: Recent Labs  Lab 08/18/21 0901 08/19/21 0101  WBC 6.6 9.7  HGB 8.9* 9.8*  HCT 28.0* 30.2*  MCV 94.3 94.4  PLT 374 353    Basic Metabolic Panel: Recent Labs  Lab 08/13/21 0839 08/14/21 0407 08/15/21 0330 08/16/21 0257 08/17/21 0300 08/18/21 0221 08/19/21 0238  NA 141   < > 138 138 138 134* 138  K 4.3   < > 4.2 4.0 3.9 4.2 4.3  CL 107   < > 107 104 102 103 100  CO2 28   < > '27 28 29 27 28  '$ GLUCOSE 134*   < > 127* 117* 119* 174* 104*  BUN 6*   < > 7* '11 11 11 12  '$ CREATININE 0.42*   < > 0.39* 0.53 0.46 0.48 0.53  CALCIUM 8.4*   < > 8.8* 8.9 8.9 8.6* 8.9  MG 1.8  --   --   --   --   --   --   PHOS 3.5   < > 2.4* 3.2 3.5 3.2 3.7   < > = values in this interval not displayed.   GFR: Estimated Creatinine Clearance: 76.7 mL/min (by C-G formula based on SCr of 0.53 mg/dL). Liver Function Tests: Recent Labs  Lab 08/15/21 0330 08/16/21 0257 08/17/21 0300 08/18/21 0221 08/19/21 0238  ALBUMIN 2.3* 2.2* 2.2* 2.2* 2.1*   No results for input(s): LIPASE, AMYLASE in the last 168 hours. No results for input(s): AMMONIA in the  last 168 hours. Coagulation Profile: No results for input(s): INR, PROTIME in the last 168 hours. BNP (last 3 results) No results for input(s): PROBNP in the last 8760 hours. HbA1C: No results for input(s): HGBA1C in the last 72 hours. CBG: Recent Labs  Lab 08/17/21 2054 08/18/21 0523  08/18/21 0911 08/18/21 1959 08/19/21 0446  GLUCAP 86 122* 150* 145* 91   Lipid Profile: No results for input(s): CHOL, HDL, LDLCALC, TRIG, CHOLHDL, LDLDIRECT in the last 72 hours. Thyroid Function Tests: No results for input(s): TSH, T4TOTAL, FREET4, T3FREE, THYROIDAB in the last 72 hours. Sepsis Labs: No results for input(s): PROCALCITON, LATICACIDVEN in the last 168 hours.  Recent Results (from the past 240 hour(s))  Culture, blood (Routine X 2) w Reflex to ID Panel     Status: None (Preliminary result)   Collection Time: 08/18/21  5:00 PM   Specimen: BLOOD  Result Value Ref Range Status   Specimen Description BLOOD LEFT ANTECUBITAL  Final   Special Requests   Final    BOTTLES DRAWN AEROBIC AND ANAEROBIC Blood Culture adequate volume   Culture   Final    NO GROWTH < 24 HOURS Performed at [facility], [address], [place], [place] [address]    Report Status PENDING  Incomplete  Culture, blood (Routine X 2) w Reflex to ID Panel     Status: None (Preliminary result)   Collection Time: 08/18/21  5:01 PM   Specimen: BLOOD  Result Value Ref Range Status   Specimen Description BLOOD BLOOD RIGHT HAND  Final   Special Requests   Final    BOTTLES DRAWN AEROBIC AND ANAEROBIC Blood Culture adequate volume   Culture   Final    NO GROWTH < 24 HOURS Performed at [facility], [place] [address], [place], [place] [address]    Report Status PENDING  Incomplete     Antimicrobials: Anti-infectives (From admission, onward)    Start     Dose/Rate Route Frequency Ordered Stop   08/20/21 0600  ceFAZolin (ANCEF) IVPB 2g/100 mL premix       Note to Pharmacy: Give in IR at time of Gastrostomy tube placement   2 g 200 mL/hr over 30 Minutes Intravenous On call 08/16/21 1620 08/21/21 0600   08/19/21 1000  meropenem (MERREM) 1 g in sodium chloride 0.9 % 100 mL IVPB        1 g 200 mL/hr over 30 Minutes Intravenous Every 8 hours 08/19/21 0615     08/18/21 1800   piperacillin-tazobactam (ZOSYN) IVPB 3.375 g  Status:  Discontinued        3.375 g 12.5 mL/hr over 240 Minutes Intravenous Every 8 hours 08/18/21 1711 08/19/21 0615   08/07/21 1400  meropenem (MERREM) 1 g in sodium chloride 0.9 % 100 mL IVPB        1 g 200 mL/hr over 30 Minutes Intravenous Every 8 hours 08/07/21 0915 08/14/21 1359   08/06/21 1200  vancomycin (VANCOREADY) IVPB 1500 mg/300 mL  Status:  Discontinued        1,500 mg 150 mL/hr over 120 Minutes Intravenous Every 24 hours 08/05/21 1107 08/07/21 1203   08/06/21 0400  meropenem (MERREM) 1 g in sodium chloride 0.9 % 100 mL IVPB  Status:  Discontinued        1 g 200 mL/hr over 30 Minutes Intravenous Every 12 hours 08/06/21 0241 08/07/21 0915   08/05/21 1932  vancomycin variable dose per unstable renal function (pharmacist dosing)  Status:  Discontinued         Does not apply See admin instructions 08/05/21 1933 08/07/21 0916   08/05/21 1923  ceFEPIme (MAXIPIME) 2 g in sodium chloride 0.9 % 100 mL IVPB  Status:  Discontinued        2 g 200 mL/hr over 30 Minutes Intravenous Every 12 hours 08/05/21 1923 08/06/21 0241   08/05/21 1100  ceFEPIme (MAXIPIME) 2 g in sodium chloride 0.9 % 100 mL IVPB  Status:  Discontinued        2 g 200 mL/hr over 30 Minutes Intravenous Every 8 hours 08/05/21 1056 08/05/21 1923   08/05/21 1100  vancomycin (VANCOREADY) IVPB 1500 mg/300 mL        1,500 mg 150 mL/hr over 120 Minutes Intravenous  Once 08/05/21 1056 08/05/21 1409      Culture/Microbiology    Component Value Date/Time   SDES BLOOD BLOOD RIGHT HAND 08/18/2021 1701   SPECREQUEST  08/18/2021 1701    BOTTLES DRAWN AEROBIC AND ANAEROBIC Blood Culture adequate volume   CULT  08/18/2021 1701    NO GROWTH < 24 HOURS Performed at [facility] [address] [place], [place] [address]    REPTSTATUS PENDING 08/18/2021 1701  Radiology Studies: [facility] Chest Port 1 View  Result Date: 08/19/2021 CLINICAL DATA:  Altered mental status EXAM: PORTABLE  CHEST 1 VIEW COMPARISON:  08/18/2021 FINDINGS: Bilateral interstitial and patchy alveolar airspace opacities, right greater than left. Small right pleural effusion. Possible trace left pleural effusion. No pneumothorax. No pleural effusion or pneumothorax. Mild stable cardiomegaly. Prior CABG. Nasogastric tube with the tip projecting over the stomach. No acute osseous abnormality. IMPRESSION: 1. Bilateral interstitial and patchy alveolar airspace opacities, right greater than left. Differential considerations include pulmonary edema versus multilobar pneumonia. Electronically Signed   By: Kathreen Devoid M.D.   On: 08/19/2021 08:48   DG Chest Port 1 View  Result Date: 08/18/2021 CLINICAL DATA:  Fever, shortness of breath EXAM: PORTABLE CHEST 1 VIEW COMPARISON:  Previous studies including the examination of 08/17/2021 FINDINGS: Transverse diameter of heart is increased. There is evidence of previous coronary bypass surgery. There is interval improvement in aeration in the right parahilar region and right lower lung fields. Residual patchy alveolar densities seen in the right mid and right lower lung fields. Small linear densities seen in the left lower lung fields. There is blunting of right lateral CP angle. There is no pneumothorax. Tip of feeding tube is noted in the medial aspect of fundus of the stomach close to the gastroesophageal junction. Feeding tube should be advanced 5-10 cm to place the tip well within the stomach. IMPRESSION: Cardiomegaly. There is interval decrease in alveolar densities in the right parahilar region and right lower lung fields suggesting resolving pneumonia or resolving asymmetric pulmonary edema. Small right pleural effusion is seen. Small linear densities in the left lower lung fields may suggest scarring or subsegmental atelectasis. Tip of feeding tube is noted in the medial aspect of fundus of the stomach close to the gastroesophageal junction. Feeding tube should be advanced 5-10  cm to place the tip well within the stomach. Electronically Signed   By: Elmer Picker M.D.   On: 08/18/2021 17:33     LOS: 14 days   Antonieta Pert, MD [facility]  08/19/2021, 10:11 AM

## 2021-08-19 NOTE — Plan of Care (Signed)
  Problem: Education: Goal: Knowledge of General Education information will improve Description: Including pain rating scale, medication(s)/side effects and non-pharmacologic comfort measures Outcome: Not Progressing   Problem: Health Behavior/Discharge Planning: Goal: Ability to manage health-related needs will improve Outcome: Not Progressing   Problem: Activity: Goal: Risk for activity intolerance will decrease Outcome: Not Progressing   Problem: Nutrition: Goal: Adequate nutrition will be maintained Outcome: Not Progressing   Problem: Coping: Goal: Level of anxiety will decrease Outcome: Not Progressing   Problem: Elimination: Goal: Will not experience complications related to bowel motility Outcome: Not Progressing Goal: Will not experience complications related to urinary retention Outcome: Not Progressing

## 2021-08-20 ENCOUNTER — Inpatient Hospital Stay (HOSPITAL_COMMUNITY): Payer: Medicare HMO

## 2021-08-20 ENCOUNTER — Ambulatory Visit: Payer: Medicare HMO | Admitting: Pulmonary Disease

## 2021-08-20 HISTORY — PX: IR GASTROSTOMY TUBE MOD SED: IMG625

## 2021-08-20 LAB — RENAL FUNCTION PANEL
Albumin: 2.1 g/dL — ABNORMAL LOW (ref 3.5–5.0)
Anion gap: 9 (ref 5–15)
BUN: 16 mg/dL (ref 8–23)
CO2: 31 mmol/L (ref 22–32)
Calcium: 9 mg/dL (ref 8.9–10.3)
Chloride: 98 mmol/L (ref 98–111)
Creatinine, Ser: 0.52 mg/dL (ref 0.44–1.00)
GFR, Estimated: >60 mL/min (ref >60)
Glucose, Bld: 117 mg/dL — ABNORMAL HIGH (ref 70–99)
Phosphorus: 3.5 mg/dL (ref 2.5–4.6)
Potassium: 3.7 mmol/L (ref 3.5–5.1)
Sodium: 138 mmol/L (ref 135–145)

## 2021-08-20 LAB — PROTIME-INR
INR: 1.1 (ref 0.8–1.2)
Prothrombin Time: 14.1 seconds (ref 11.4–15.2)

## 2021-08-20 LAB — GLUCOSE, CAPILLARY
Glucose-Capillary: 101 mg/dL — ABNORMAL HIGH (ref 70–99)
Glucose-Capillary: 83 mg/dL (ref 70–99)
Glucose-Capillary: 84 mg/dL (ref 70–99)
Glucose-Capillary: 88 mg/dL (ref 70–99)
Glucose-Capillary: 97 mg/dL (ref 70–99)
Glucose-Capillary: 98 mg/dL (ref 70–99)

## 2021-08-20 MED ORDER — STERILE WATER FOR INJECTION IJ SOLN
INTRAMUSCULAR | Status: AC
  Filled 2021-08-20: qty 10

## 2021-08-20 MED ORDER — CEFAZOLIN SODIUM-DEXTROSE 2-4 GM/100ML-% IV SOLN
INTRAVENOUS | Status: AC
  Filled 2021-08-20: qty 100

## 2021-08-20 MED ORDER — FENTANYL CITRATE (PF) 100 MCG/2ML IJ SOLN
INTRAMUSCULAR | Status: AC
  Filled 2021-08-20: qty 2

## 2021-08-20 MED ORDER — MIDAZOLAM HCL 2 MG/2ML IJ SOLN
INTRAMUSCULAR | Status: AC | PRN
  Administered 2021-08-20: 1 mg via INTRAVENOUS

## 2021-08-20 MED ORDER — MIDAZOLAM HCL 2 MG/2ML IJ SOLN
INTRAMUSCULAR | Status: AC
  Filled 2021-08-20: qty 2

## 2021-08-20 MED ORDER — DEXTROSE-NACL 5-0.9 % IV SOLN: INTRAVENOUS | Status: DC

## 2021-08-20 MED ORDER — LIDOCAINE-EPINEPHRINE 1 %-1:100000 IJ SOLN
INTRAMUSCULAR | Status: AC
  Filled 2021-08-20: qty 1

## 2021-08-20 MED ORDER — LIDOCAINE HCL (PF) 1 % IJ SOLN
INTRAMUSCULAR | Status: AC | PRN
  Administered 2021-08-20: 20 mL

## 2021-08-20 MED ORDER — FENTANYL CITRATE (PF) 100 MCG/2ML IJ SOLN
INTRAMUSCULAR | Status: AC | PRN
  Administered 2021-08-20: 25 ug via INTRAVENOUS

## 2021-08-20 MED ORDER — IOHEXOL 300 MG/ML  SOLN
100.0000 mL | Freq: Once | INTRAMUSCULAR | Status: AC | PRN
Start: 2021-08-20 — End: 2021-08-20
  Administered 2021-08-20: 30 mL

## 2021-08-20 NOTE — Progress Notes (Signed)
PROGRESS NOTE [name] [name]  XNA:[id number] DOB: [birth year] DOA: 08/05/2021 PCP: Leeroy Cha, MD   Brief Narrative/Hospital Course: 61 y.o. fw/ history significant of failure to thrive, dysphagia secondary to moderate esophageal dysmotility (barium swallow March 2023), psoriasis arthritis, CAD s/p CABG, DM-2, chronic systolic CHF LVEF 54%, was sent to hospital for evaluation of altered mentations hypoxia and fever. Recently hospitalized and discharged to nursing home 11 days PTA after treatment of supposed empyema and pneumonia. Patient has established history of failure to thrive and has been losing weight, and according to the record she lost about 13 kg in last 12-month.  Per report she was discharged on soft diet, last week, her diet was downgraded to pured. Despite, family observed several times patient had choking episode after eating for food. In ED- hypoxic initially briefly on 15 L of nonrebreather and titrated down to 5 L, tachycardia hypotensive but responded to IV fluid.  Febrile 101.4 CTAPE-negative for PE but patchy infiltrates on bilateral lower lung fields suspicious for pneumonia.Small right-sided pleural effusion. Patient is admitted for severe sepsis secondary to aspiration pneumonia and Bacteremia with ESBL E Coli -being managed with  Meropenem-total course 7 days, placed on Cortrak for feeding. Seen by GI who felt nothing further to add in terms of management- and if agreeable consider PEG. Seen by palliative care and have had multiple conversations with patient  and family.they wanted to proceed with PEG tube placement, IR consulted, aspirin on hold for PEG tube on 6/6    Subjective: Seen and examined this morning.  Alert awake mildly confused with oxygen to 3 L saturating at 99% Able to cough cold. Overnight no episodes of fever, BP stable heart rate is stable Labs with stable renal function Chest x-ray-shows persistence interstitial and airspace  process  Assessment and Plan: Principal Problem:   Sepsis ([facility] Active Problems:   Rheumatoid arthritis ([facility]   Gastroesophageal reflux disease without esophagitis   AMS (altered mental status)   Hypokalemia   Hypernatremia   Pressure injury of skin   Malnutrition of moderate degree   AKI (acute kidney injury) ([facility]   Adult failure to thrive syndrome   Dysphagia   Chronic systolic CHF (congestive heart failure) (HCC)   Infection due to ESBL-producing Escherichia coli   Hypophosphatemia   Folate deficiency  Severe sepsis due to ESBL E. coli bacteremia Aspiration pneumonia Possible UTI Acute on chronic respiratory with hypoxia Fever episodes 6/5-multifocal pneumonia on chest x-ray versus pulm edema: Blood culture ESBL E. coli seen by ID. Completed 7 days meropenem  08/14/21.Sepsis resolved.  Patient had thick secretion episodes of fever along with tachycardia -given IV Lasix Tylenol.no fever recurrence since  Blood culture from 6/4 NGTD. Cont Meropenem given patient's ESBL bacteremia-.Continue pulmonary support- if blood culture negative can de-escalate antibiotics to cover for aspiration pneumonia.repeating chest x-ray- persistence interstitial and airspace process  Failure to thrive Dysphagia-esophageal dysmotility Severe protein calorie malnutrition POA: Patient esophageal dysmotility noted to have had swallowing problems, seen by speech back in March with inadequate nutrition severe hypoalbuminemia.  Getting tube feed with core track, dietitian following, PMT following, seen by GI-EGD would not improve dysmotility or decrease of aspiration pneumonia and will not change her discharge.IR consulted for PEG tube placement.  CT scan anatomy amenable for PEG tube per IR.IR advised to hold aspirin x5 days-and planning for PEG tube placement 6/6, tube feed held last night.  Hypertension Acute on chronic systolic CHF: BP stable.given Lasix IV as per 1. e to patient's respiratory  status/x-ray changes.Monitor intake output balance although had significant net positive balance unclear if it is accurate  Net IO Since Admission: 20,464.91 mL [08/20/21 0812] Weight 164 on 5/27- 181 6/3> 171 lb  Acute metabolic encephalopathy in the setting of sepsis: Alert awake and conversant, but remains intermittently confused AKI: Resolved  Hyperkalemia resolved Hypokalemia/hypophosphatemia: Resolved.  S/p Neutra-Phos supplement. GERD continue PPI  Anemia/folate deficiency without overt bleeding monitor H&H transfuse for less than 7 g.Anemia panel with iron level of 19, folate of 4.0, ferritin at 1850 Vitamin B12 levels of 421.  Monitor Recent Labs  Lab 08/18/21 0901 08/19/21 0101  HGB 8.9* 9.8*  HCT 28.0* 30.2*   Pseudohypocalcemia correctedcalcium stable  Psoriasis arthritis: On outpatient follow-up with rheumatology  Pressure injury POA as below. Pressure Injury 08/05/21 Buttocks Right Stage 2 -  Partial thickness loss of dermis presenting as a shallow open injury with a red, pink wound bed without slough. red, no drainage (Active)  08/05/21 1835  Location: Buttocks  Location Orientation: Right  Staging: Stage 2 -  Partial thickness loss of dermis presenting as a shallow open injury with a red, pink wound bed without slough.  Wound Description (Comments): red, no drainage  Present on Admission: Yes     Pressure Injury 08/05/21 Hip Right Stage 2 -  Partial thickness loss of dermis presenting as a shallow open injury with a red, pink wound bed without slough. (Active)  08/05/21 1837  Location: Hip  Location Orientation: Right  Staging: Stage 2 -  Partial thickness loss of dermis presenting as a shallow open injury with a red, pink wound bed without slough.  Wound Description (Comments):   Present on Admission: Yes   DVT prophylaxis: heparin injection 5,000 Units Start: 08/05/21 1545 Code Status:   Code Status: Full Code Family Communication: plan of care discussed with  patient/ no family at bedside.  PMT discussed with palliative care and they have updated family at bedside. I called her son and updated previously I updated patient's son [name] today.  Patient status NF:[id number] because of ongoing need for further procedure of PEG tube Level of care:Telemetry Medical   Dispo: The patient is from: [facility]            Anticipated disposition:SNF hopefully Thursday after tolerating tube feeds. Encourage PT OT participation  Mobility Assessment (last 72 hours)     Mobility Assessment     Row Name 08/19/21 0815 08/18/21 1116 08/17/21 0950       Does patient have an order for bedrest or is patient medically unstable No - Continue assessment No - Continue assessment No - Continue assessment     Is the above level different from baseline mobility prior to current illness? Yes - Recommend PT order Yes - Recommend PT order Yes - Recommend PT order               Objective: Vitals last 24 hrs: Vitals:   08/19/21 1913 08/19/21 2111 08/20/21 0011 08/20/21 0417  BP:   114/74 103/64  Pulse: 80 (!) 108 94 93  Resp: '17 18 20 19  '$ Temp:  99 F (37.2 C) 98.7 F (37.1 C) 98.8 F (37.1 C)  TempSrc:  Oral Oral Oral  SpO2: (!) 89% 96% 100% 100%  Weight:       Weight change:   Physical Examination: General exam: Aaox2-3, older than stated age, weak appearing. HEENT:Oral mucosa moist, Ear/Nose WNL grossly, dentition normal. Respiratory system: bilaterally basal crackles,no use of accessory  muscle Cardiovascular system: S1 & S2 +, No JVD,. Gastrointestinal system: Abdomen soft,NT,ND,BS+. Cortrak+ Nervous System:Alert, awake, moving extremities and grossly nonfocal Extremities: LE ankle edema neg, distal peripheral pulses palpable.  Skin: No rashes,no icterus. MSK: Normal muscle bulk,tone, power  Cortrak- NGT+  Medications reviewed:  Scheduled Meds:  atorvastatin  80 mg Per Tube Daily   escitalopram  10 mg Per Tube QHS    feeding supplement (PROSource TF)  45 mL Per Tube BID   folic acid  1 mg Per Tube Daily   free water  175 mL Per Tube Q4H   gabapentin  100 mg Per Tube Q12H   heparin  5,000 Units Subcutaneous Q8H   liver oil-zinc oxide   Topical QID   metoprolol tartrate  25 mg Per Tube BID   pantoprazole sodium  40 mg Per Tube Daily   phosphorus  250 mg Per Tube Daily   Continuous Infusions:   ceFAZolin (ANCEF) IV     feeding supplement (JEVITY 1.5 CAL/FIBER) 25 mL/hr at 08/19/21 2106   meropenem (MERREM) IV 1 g (08/20/21 0245)    Pressure Injury 08/05/21 Buttocks Right Stage 2 -  Partial thickness loss of dermis presenting as a shallow open injury with a red, pink wound bed without slough. red, no drainage (Active)  08/05/21 1835  Location: Buttocks  Location Orientation: Right  Staging: Stage 2 -  Partial thickness loss of dermis presenting as a shallow open injury with a red, pink wound bed without slough.  Wound Description (Comments): red, no drainage  Present on Admission: Yes  Dressing Type Foam - Lift dressing to assess site every shift 08/19/21 0815     Pressure Injury 08/05/21 Hip Right Stage 2 -  Partial thickness loss of dermis presenting as a shallow open injury with a red, pink wound bed without slough. (Active)  08/05/21 1837  Location: Hip  Location Orientation: Right  Staging: Stage 2 -  Partial thickness loss of dermis presenting as a shallow open injury with a red, pink wound bed without slough.  Wound Description (Comments):   Present on Admission: Yes  Dressing Type Foam - Lift dressing to assess site every shift 08/17/21 2107   Diet Order             Diet NPO time specified Except for: Other (See Comments)  Diet effective midnight                    Nutrition Problem: Moderate Malnutrition Etiology: chronic illness (dysphagia) Signs/Symptoms: mild muscle depletion, moderate muscle depletion, mild fat depletion, moderate fat depletion Interventions: Refer to RD  note for recommendations   Intake/Output Summary (Last 24 hours) at 08/20/2021 0812 Last data filed at 08/19/2021 2258 Gross per 24 hour  Intake 2801 ml  Output 1153 ml  Net 1648 ml    Net IO Since Admission: 20,464.91 mL [08/20/21 0812]  Wt Readings from Last 3 Encounters:  08/19/21 77.6 kg  07/30/21 68 kg  07/29/21 68.6 kg     Unresulted Labs (From admission, onward)     Start     Ordered   08/08/21 0500  Renal function panel  Daily,   R     Question:  Specimen collection method  Answer:  Lab=Lab collect   08/07/21 1745          Data Reviewed: I have personally reviewed following labs and imaging studies CBC: Recent Labs  Lab 08/18/21 0901 08/19/21 0101  WBC 6.6 9.7  HGB 8.9*  9.8*  HCT 28.0* 30.2*  MCV 94.3 94.4  PLT 374 385     Basic Metabolic Panel: Recent Labs  Lab 08/13/21 0839 08/14/21 0407 08/16/21 0257 08/17/21 0300 08/18/21 0221 08/19/21 0238 08/20/21 0249  NA 141   < > 138 138 134* 138 138  K 4.3   < > 4.0 3.9 4.2 4.3 3.7  CL 107   < > 104 102 103 100 98  CO2 28   < > '28 29 27 28 31  '$ GLUCOSE 134*   < > 117* 119* 174* 104* 117*  BUN 6*   < > '11 11 11 12 16  '$ CREATININE 0.42*   < > 0.53 0.46 0.48 0.53 0.52  CALCIUM 8.4*   < > 8.9 8.9 8.6* 8.9 9.0  MG 1.8  --   --   --   --   --   --   PHOS 3.5   < > 3.2 3.5 3.2 3.7 3.5   < > = values in this interval not displayed.    GFR: Estimated Creatinine Clearance: 76.7 mL/min (by C-G formula based on SCr of 0.52 mg/dL). Liver Function Tests: Recent Labs  Lab 08/16/21 0257 08/17/21 0300 08/18/21 0221 08/19/21 0238 08/20/21 0249  ALBUMIN 2.2* 2.2* 2.2* 2.1* 2.1*    No results for input(s): LIPASE, AMYLASE in the last 168 hours. No results for input(s): AMMONIA in the last 168 hours. Coagulation Profile: Recent Labs  Lab 08/20/21 0249  INR 1.1   BNP (last 3 results) No results for input(s): PROBNP in the last 8760 hours. HbA1C: No results for input(s): HGBA1C in the last 72  hours. CBG: Recent Labs  Lab 08/19/21 1113 08/19/21 1615 08/19/21 2114 08/20/21 0010 08/20/21 0418  GLUCAP 98 71 73 97 101*    Lipid Profile: No results for input(s): CHOL, HDL, LDLCALC, TRIG, CHOLHDL, LDLDIRECT in the last 72 hours. Thyroid Function Tests: No results for input(s): TSH, T4TOTAL, FREET4, T3FREE, THYROIDAB in the last 72 hours. Sepsis Labs: No results for input(s): PROCALCITON, LATICACIDVEN in the last 168 hours.  Recent Results (from the past 240 hour(s))  Culture, blood (Routine X 2) w Reflex to ID Panel     Status: None (Preliminary result)   Collection Time: 08/18/21  5:00 PM   Specimen: BLOOD  Result Value Ref Range Status   Specimen Description BLOOD LEFT ANTECUBITAL  Final   Special Requests   Final    BOTTLES DRAWN AEROBIC AND ANAEROBIC Blood Culture adequate volume   Culture   Final    NO GROWTH < 24 HOURS Performed at [facility], [address], [place], [place] [address]    Report Status PENDING  Incomplete  Culture, blood (Routine X 2) w Reflex to ID Panel     Status: None (Preliminary result)   Collection Time: 08/18/21  5:01 PM   Specimen: BLOOD  Result Value Ref Range Status   Specimen Description BLOOD BLOOD RIGHT HAND  Final   Special Requests   Final    BOTTLES DRAWN AEROBIC AND ANAEROBIC Blood Culture adequate volume   Culture   Final    NO GROWTH < 24 HOURS Performed at [facility], [place] [address] [place], [place] [address]    Report Status PENDING  Incomplete     Antimicrobials: Anti-infectives (From admission, onward)    Start     Dose/Rate Route Frequency Ordered Stop   08/20/21 0600  ceFAZolin (ANCEF) IVPB 2g/100 mL premix  Note to Pharmacy: Give in IR at time of Gastrostomy tube placement   2 g 200 mL/hr over 30 Minutes Intravenous On call 08/16/21 1620 08/21/21 0600   08/19/21 1000  meropenem (MERREM) 1 g in sodium chloride 0.9 % 100 mL IVPB        1 g 200 mL/hr over 30 Minutes Intravenous Every 8  hours 08/19/21 0615     08/18/21 1800  piperacillin-tazobactam (ZOSYN) IVPB 3.375 g  Status:  Discontinued        3.375 g 12.5 mL/hr over 240 Minutes Intravenous Every 8 hours 08/18/21 1711 08/19/21 0615   08/07/21 1400  meropenem (MERREM) 1 g in sodium chloride 0.9 % 100 mL IVPB        1 g 200 mL/hr over 30 Minutes Intravenous Every 8 hours 08/07/21 0915 08/14/21 1359   08/06/21 1200  vancomycin (VANCOREADY) IVPB 1500 mg/300 mL  Status:  Discontinued        1,500 mg 150 mL/hr over 120 Minutes Intravenous Every 24 hours 08/05/21 1107 08/07/21 1203   08/06/21 0400  meropenem (MERREM) 1 g in sodium chloride 0.9 % 100 mL IVPB  Status:  Discontinued        1 g 200 mL/hr over 30 Minutes Intravenous Every 12 hours 08/06/21 0241 08/07/21 0915   08/05/21 1932  vancomycin variable dose per unstable renal function (pharmacist dosing)  Status:  Discontinued         Does not apply See admin instructions 08/05/21 1933 08/07/21 0916   08/05/21 1923  ceFEPIme (MAXIPIME) 2 g in sodium chloride 0.9 % 100 mL IVPB  Status:  Discontinued        2 g 200 mL/hr over 30 Minutes Intravenous Every 12 hours 08/05/21 1923 08/06/21 0241   08/05/21 1100  ceFEPIme (MAXIPIME) 2 g in sodium chloride 0.9 % 100 mL IVPB  Status:  Discontinued        2 g 200 mL/hr over 30 Minutes Intravenous Every 8 hours 08/05/21 1056 08/05/21 1923   08/05/21 1100  vancomycin (VANCOREADY) IVPB 1500 mg/300 mL        1,500 mg 150 mL/hr over 120 Minutes Intravenous  Once 08/05/21 1056 08/05/21 1409      Culture/Microbiology    Component Value Date/Time   SDES BLOOD BLOOD RIGHT HAND 08/18/2021 1701   SPECREQUEST  08/18/2021 1701    BOTTLES DRAWN AEROBIC AND ANAEROBIC Blood Culture adequate volume   CULT  08/18/2021 1701    NO GROWTH < 24 HOURS Performed at [facility], [employer] [address], [place], [place] [address]    REPTSTATUS PENDING 08/18/2021 1701  Radiology Studies: [facility] Chest Port 1 View  Result Date: 08/19/2021 CLINICAL  DATA:  Altered mental status EXAM: PORTABLE CHEST 1 VIEW COMPARISON:  08/18/2021 FINDINGS: Bilateral interstitial and patchy alveolar airspace opacities, right greater than left. Small right pleural effusion. Possible trace left pleural effusion. No pneumothorax. No pleural effusion or pneumothorax. Mild stable cardiomegaly. Prior CABG. Nasogastric tube with the tip projecting over the stomach. No acute osseous abnormality. IMPRESSION: 1. Bilateral interstitial and patchy alveolar airspace opacities, right greater than left. Differential considerations include pulmonary edema versus multilobar pneumonia. Electronically Signed   By: Kathreen Devoid M.D.   On: 08/19/2021 08:48   DG Chest Port 1 View  Result Date: 08/18/2021 CLINICAL DATA:  Fever, shortness of breath EXAM: PORTABLE CHEST 1 VIEW COMPARISON:  Previous studies including the examination of 08/17/2021 FINDINGS: Transverse diameter of heart is increased. There is evidence  of previous coronary bypass surgery. There is interval improvement in aeration in the right parahilar region and right lower lung fields. Residual patchy alveolar densities seen in the right mid and right lower lung fields. Small linear densities seen in the left lower lung fields. There is blunting of right lateral CP angle. There is no pneumothorax. Tip of feeding tube is noted in the medial aspect of fundus of the stomach close to the gastroesophageal junction. Feeding tube should be advanced 5-10 cm to place the tip well within the stomach. IMPRESSION: Cardiomegaly. There is interval decrease in alveolar densities in the right parahilar region and right lower lung fields suggesting resolving pneumonia or resolving asymmetric pulmonary edema. Small right pleural effusion is seen. Small linear densities in the left lower lung fields may suggest scarring or subsegmental atelectasis. Tip of feeding tube is noted in the medial aspect of fundus of the stomach close to the gastroesophageal  junction. Feeding tube should be advanced 5-10 cm to place the tip well within the stomach. Electronically Signed   By: Elmer Picker M.D.   On: 08/18/2021 17:33     LOS: 15 days   Antonieta Pert, MD [facility]  08/20/2021, 8:12 AM

## 2021-08-20 NOTE — Progress Notes (Signed)
PT Cancellation Note  Patient Details Name: [name] [name] MRN: [id number] DOB: 12-02-1960   Cancelled Treatment:    Reason Eval/Treat Not Completed: Patient at procedure or test/unavailable, pt off unit for PEG tube placement. Will check back as schedule allows to continue with PT POC.    Betsey Holiday Bayleigh Loflin 08/20/2021, 12:10 PM

## 2021-08-20 NOTE — Progress Notes (Signed)
Occupational Therapy Treatment Patient Details Name: [name] [name] MRN: [id number] DOB: [birth year] Today's Date: 08/20/2021   History of present illness [name] [name] is a 61 y.o. female sent to hospital for evaluation of altered mentations hypoxia and fever. Admitted for sepsis. Palliative consult during admission with family opting for PEG tub placement on 6/6. PMH: failure to thrive, dysphagia secondary to moderate esophageal dysmotility (barium swallow March 2023), psoriasis arthritis, CAD status post CABG, DM-2, chronic systolic CHF LVEF 02%,   OT comments  Pt reports pain improvements today and more participatory in therapeutic tasks (UE strengthening, pressure relief strategies and repositioning in bed). However, pt remains limited in progress due to deficits in attention and problem solving requiring frequent redirection to tasks. Session abbreviated with transport arrival for procedure this AM. Will continue to follow acutely.    Recommendations for follow up therapy are one component of a multi-disciplinary discharge planning process, led by the attending physician.  Recommendations may be updated based on patient status, additional functional criteria and insurance authorization.    Follow Up Recommendations  Skilled nursing-short term rehab (<3 hours/day) (pending improved participation with therapy at acute level)    Assistance Recommended at Discharge Frequent or constant Supervision/Assistance  Patient can return home with the following  A lot of help with walking and/or transfers;A lot of help with bathing/dressing/bathroom;Direct supervision/assist for medications management;Direct supervision/assist for financial management   Equipment Recommendations  Wheelchair (measurements OT);Wheelchair cushion (measurements OT);Hospital bed    Recommendations for Other Services      Precautions / Restrictions Precautions Precautions: Fall;Other (comment) Precaution Comments:  cortrak Restrictions Weight Bearing Restrictions: No       Mobility Bed Mobility                    Transfers                         Balance                                           ADL either performed or assessed with clinical judgement   ADL Overall ADL's : Needs assistance/impaired                                       General ADL Comments: Emphasis on UE exercises, attention to tasks and increasing independence/ability to relieve pressure with progression of ability to reposition self in bed    Extremity/Trunk Assessment Upper Extremity Assessment Upper Extremity Assessment: Generalized weakness;RUE deficits/detail;LUE deficits/detail RUE Deficits / Details: R elbow with impaired extension, unable to FF >30 degrees in shoulder RUE Coordination: decreased fine motor LUE Deficits / Details: extension impaired in L elbow but able to FF WNL in shoulder LUE Coordination: decreased fine motor   Lower Extremity Assessment Lower Extremity Assessment: Defer to PT evaluation        Vision   Vision Assessment?: No apparent visual deficits   Perception     Praxis      Cognition Arousal/Alertness: Awake/alert Behavior During Therapy: Flat affect Overall Cognitive Status: No family/caregiver present to determine baseline cognitive functioning Area of Impairment: Following commands, Awareness, Problem solving, Safety/judgement, Memory  Memory: Decreased short-term memory Following Commands: Follows one step commands inconsistently, Follows one step commands with increased time Safety/Judgement: Decreased awareness of deficits Awareness: Intellectual Problem Solving: Slow processing, Decreased initiation, Difficulty sequencing, Requires verbal cues, Requires tactile cues General Comments: decreased awareness of deficits and need to participate to progress functional skills. tangential with  poor attention to tasks        Exercises Exercises: Other exercises, General Upper Extremity General Exercises - Upper Extremity Shoulder Horizontal ABduction: Theraband, 5 reps, Strengthening, Both Theraband Level (Shoulder Horizontal Abduction): Level 1 (Yellow) Other Exercises Other Exercises: squeeze ball x 5 BUE Other Exercises: reaching to B bedrails to pull self to long sitting (unable)    Shoulder Instructions       General Comments      Pertinent Vitals/ Pain       Pain Assessment Pain Assessment: Faces Faces Pain Scale: Hurts little more Pain Location: R UE with reaching Pain Descriptors / Indicators: Grimacing, Sore Pain Intervention(s): Monitored during session  Home Living                                          Prior Functioning/Environment              Frequency  Min 2X/week        Progress Toward Goals  OT Goals(current goals can now be found in the care plan section)  Progress towards OT goals: OT to reassess next treatment  Acute Rehab OT Goals Patient Stated Goal: get PEG tube today OT Goal Formulation: With patient Time For Goal Achievement: 08/24/21 Potential to Achieve Goals: [place] Discharge plan remains appropriate (pending improvements in participation)    Co-evaluation                 AM-PAC OT "6 Clicks" Daily Activity     Outcome Measure   Help from another person eating meals?: A Little Help from another person taking care of personal grooming?: A Lot Help from another person toileting, which includes using toliet, bedpan, or urinal?: Total Help from another person bathing (including washing, rinsing, drying)?: Total Help from another person to put on and taking off regular upper body clothing?: A Lot Help from another person to put on and taking off regular lower body clothing?: Total 6 Click Score: 10    End of Session Equipment Utilized During Treatment: Oxygen  OT Visit Diagnosis:  Other abnormalities of gait and mobility (R26.89);Muscle weakness (generalized) (M62.81);Adult, failure to thrive (R62.7);Pain Pain - Right/Left: Left Pain - part of body: Shoulder   Activity Tolerance Other (comment) (limited by cognition)   Patient Left in bed;with call bell/phone within reach   Nurse Communication Mobility status        Time: 8527-7824 OT Time Calculation (min): 12 min  Charges: OT General Charges $OT Visit: 1 Visit OT Treatments $Therapeutic Activity: 8-22 mins  Malachy Chamber, OTR/L Acute Rehab Services Office: [phone]   Layla Maw 08/20/2021, 10:44 AM

## 2021-08-20 NOTE — Procedures (Signed)
Interventional Radiology Procedure Note  Procedure: Placement of percutaneous 20 Fr balloon retention gastrostomy tube.  Complications: None  Recommendations: - NPO except for sips and chips remainder of today and overnight - Maintain G-tube to LWS until tomorrow morning  - May advance diet as tolerated and begin using tube tomorrow morning   Ruthann Cancer, MD

## 2021-08-21 DIAGNOSIS — E538 Deficiency of other specified B group vitamins: Secondary | ICD-10-CM

## 2021-08-21 LAB — GLUCOSE, CAPILLARY
Glucose-Capillary: 104 mg/dL — ABNORMAL HIGH (ref 70–99)
Glucose-Capillary: 121 mg/dL — ABNORMAL HIGH (ref 70–99)
Glucose-Capillary: 145 mg/dL — ABNORMAL HIGH (ref 70–99)
Glucose-Capillary: 160 mg/dL — ABNORMAL HIGH (ref 70–99)
Glucose-Capillary: 61 mg/dL — ABNORMAL LOW (ref 70–99)
Glucose-Capillary: 70 mg/dL (ref 70–99)
Glucose-Capillary: 85 mg/dL (ref 70–99)
Glucose-Capillary: 94 mg/dL (ref 70–99)

## 2021-08-21 LAB — RENAL FUNCTION PANEL
Albumin: 2.2 g/dL — ABNORMAL LOW (ref 3.5–5.0)
Anion gap: 7 (ref 5–15)
BUN: 12 mg/dL (ref 8–23)
CO2: 32 mmol/L (ref 22–32)
Calcium: 9.2 mg/dL (ref 8.9–10.3)
Chloride: 100 mmol/L (ref 98–111)
Creatinine, Ser: 0.49 mg/dL (ref 0.44–1.00)
GFR, Estimated: >60 mL/min (ref >60)
Glucose, Bld: 79 mg/dL (ref 70–99)
Phosphorus: 3.1 mg/dL (ref 2.5–4.6)
Potassium: 3.8 mmol/L (ref 3.5–5.1)
Sodium: 139 mmol/L (ref 135–145)

## 2021-08-21 MED ORDER — SODIUM CHLORIDE 0.9 % IV SOLN: INTRAVENOUS | Status: DC | PRN

## 2021-08-21 MED ORDER — DEXTROSE 50 % IV SOLN
12.5000 g | INTRAVENOUS | Status: AC
  Administered 2021-08-21: 12.5 g via INTRAVENOUS

## 2021-08-21 MED ORDER — FREE WATER
100.0000 mL | Freq: Four times a day (QID) | Status: DC
  Administered 2021-08-21 – 2021-08-26 (×17): 100 mL

## 2021-08-21 MED ORDER — JEVITY 1.5 CAL/FIBER PO LIQD
237.0000 mL | Freq: Every day | ORAL | Status: DC
  Administered 2021-08-21 – 2021-08-26 (×25): 237 mL
  Filled 2021-08-21: qty 237
  Filled 2021-08-21: qty 1000
  Filled 2021-08-21 (×4): qty 237
  Filled 2021-08-21: qty 1000
  Filled 2021-08-21 (×16): qty 237
  Filled 2021-08-21 (×2): qty 1000
  Filled 2021-08-21 (×2): qty 237
  Filled 2021-08-21: qty 1000
  Filled 2021-08-21 (×2): qty 237

## 2021-08-21 MED ORDER — FREE WATER
120.0000 mL | Freq: Every day | Status: DC
  Administered 2021-08-21 – 2021-08-24 (×16): 120 mL
  Administered 2021-08-24: 60 mL
  Administered 2021-08-24 – 2021-08-26 (×8): 120 mL

## 2021-08-21 MED ORDER — ORAL CARE MOUTH RINSE
15.0000 mL | Freq: Two times a day (BID) | OROMUCOSAL | Status: DC
Start: 2021-08-22 — End: 2021-08-22

## 2021-08-21 MED ORDER — ORAL CARE MOUTH RINSE
15.0000 mL | Freq: Two times a day (BID) | OROMUCOSAL | Status: DC
  Administered 2021-08-22 – 2021-08-26 (×8): 15 mL via OROMUCOSAL

## 2021-08-21 MED ORDER — CHLORHEXIDINE GLUCONATE 0.12 % MT SOLN
15.0000 mL | Freq: Two times a day (BID) | OROMUCOSAL | Status: DC
  Administered 2021-08-21 – 2021-08-26 (×10): 15 mL via OROMUCOSAL
  Filled 2021-08-21 (×10): qty 15

## 2021-08-21 MED ORDER — DEXTROSE 50 % IV SOLN
INTRAVENOUS | Status: AC
  Filled 2021-08-21: qty 50

## 2021-08-21 NOTE — NC FL2 (Signed)
[place] LEVEL OF CARE SCREENING TOOL     IDENTIFICATION  Patient Name: [name] [name] Birthdate: 1961-01-23 Sex: female Admission Date (Current Location): 08/05/2021  [facility] and [place] Number:      Facility and Address:  [facility] [place], [place] [address], [place], [place] [address]      Provider Number:    Attending Physician Name and Address:  [place] Eric J, DO  Relative Name and Phone Number:  Shoshanah, Dapper   [phone]    Current Level of Care: Hospital Recommended Level of Care: [place] Prior Approval Number:    Date Approved/Denied:   PASRR Number: [id number]  Discharge Plan:      Current Diagnoses: Patient Active Problem List   Diagnosis Date Noted   Folate deficiency 08/09/2021   Hypophosphatemia 77/82/4235   Chronic systolic CHF (congestive heart failure) ([place]) 08/07/2021   Infection due to ESBL-producing Escherichia coli    Aortic atherosclerosis ([place]) 08/02/2021   Adult failure to thrive syndrome 08/01/2021   Dysphagia 08/01/2021   Troponin I above reference range 07/18/2021   Chronic combined systolic and diastolic CHF (congestive heart failure) ([place]) 07/18/2021   AKI (acute kidney injury) ([place]) 05/15/2021   Sepsis ([place]) 05/14/2021   Multiple thyroid nodules 05/14/2021   AMS (altered mental status) 05/01/2021   Pressure injury of skin 05/01/2021   Malnutrition of moderate degree 05/01/2021   Hypokalemia    Urinary tract infection without hematuria    Lactic acidosis    Hypernatremia    Reactive depression (situational) 04/05/2021   Chest pain 04/06/2020   CAD (coronary artery disease)    Gastroesophageal reflux disease without esophagitis    Renal calculus, left 01/10/2019   Chest pain in adult 03/02/2017   Hypoxia    Acute on chronic diastolic CHF (congestive heart failure) ([place])    Hx of CABG    S/P angioplasty with stent; 10/04/15 to distal RCA lesion. with Promus premier.  10/08/2015   CAD (coronary artery disease) of artery bypass graft 10/08/2015   History of ST elevation myocardial infarction (STEMI) 10/04/2015   Superficial incisional surgical site infection, medial left lower extremity 07/10/2015   Hyperlipidemia with target LDL less than 70 06/13/2015   Chest pain with moderate risk of acute coronary syndrome    Tobacco abuse    Insulin-requiring or dependent type II diabetes mellitus ([place]) 06/04/2012   Lupus ([place]) 06/03/2012   History of stroke 06/03/2012   Empty sella syndrome ([place]) 06/03/2012   Accelerated hypertension 06/03/2012   Rheumatoid arthritis ([place]) 06/03/2012   Hidradenitis axillaris 06/03/2012   Obesity (BMI 30.0-34.9)     Orientation RESPIRATION BLADDER Height & Weight     Time, Self, Place  O2 Incontinent, External catheter Weight: 171 lb (77.6 kg) Height:     BEHAVIORAL SYMPTOMS/MOOD NEUROLOGICAL BOWEL NUTRITION STATUS        Feeding tube  AMBULATORY STATUS COMMUNICATION OF NEEDS Skin   Total Care   PU Stage and Appropriate Care (two Stage 2 PIs at the right buttock and right hip. New area is moisture-related)                       Personal Care Assistance Level of Assistance    Bathing Assistance: Maximum assistance Feeding assistance: Limited assistance Dressing Assistance: Maximum assistance     Functional Limitations Info    Sight Info: Adequate Hearing Info: Adequate Speech Info: Adequate    SPECIAL CARE FACTORS FREQUENCY  Contractures      Additional Factors Info                  Current Medications (08/21/2021):  This is the current hospital active medication list Current Facility-Administered Medications  Medication Dose Route Frequency Provider Last Rate Last Admin   acetaminophen (TYLENOL) suppository 650 mg  650 mg Rectal Q6H PRN Sheikh, Omair Latif, DO       atorvastatin (LIPITOR) tablet 80 mg  80 mg Per Tube Daily Eugenie Filler, MD   80 mg at 08/20/21  1812   bisacodyl (DULCOLAX) suppository 10 mg  10 mg Rectal Daily PRN Wynetta Fines T, MD       dextrose 50 % solution            escitalopram (LEXAPRO) tablet 10 mg  10 mg Per Tube QHS Eugenie Filler, MD   10 mg at 08/20/21 2019   feeding supplement (JEVITY 1.5 CAL/FIBER) liquid 237 mL  237 mL Per Tube 5 X Daily [place] Eric J, DO   237 mL at 08/21/21 1120   feeding supplement (PROSource TF) liquid 45 mL  45 mL Per Tube BID Kc, Maren [place], MD   45 mL at 12/45/80 9983   folic acid (FOLVITE) tablet 1 mg  1 mg Per Tube Daily Eugenie Filler, MD   1 mg at 08/21/21 0948   free water 100 mL  100 mL Per Tube Q6H [place] Donnamarie Poag, DO   100 mL at 08/21/21 1121   free water 120 mL  120 mL Per Tube 5 X Daily [place] Eric J, DO   120 mL at 08/21/21 1120   gabapentin (NEURONTIN) 250 MG/5ML solution 100 mg  100 mg Per Tube Q12H Eugenie Filler, MD   100 mg at 08/21/21 0948   heparin injection 5,000 Units  5,000 Units Subcutaneous Q8H Wynetta Fines T, MD   5,000 Units at 08/21/21 0550   HYDROmorphone (DILAUDID) injection 0.5-1 mg  0.5-1 mg Intravenous Q2H PRN Wynetta Fines T, MD   1 mg at 08/20/21 2019   lip balm (CARMEX) ointment   Topical PRN Eugenie Filler, MD       liver oil-zinc oxide (DESITIN) 40 % ointment   Topical QID Eugenie Filler, MD   Given at 08/21/21 [id number]   loperamide HCl (IMODIUM) 1 MG/7.5ML suspension 2 mg  2 mg Per Tube PRN Elie Confer B, NP   2 mg at 08/16/21 0953   meropenem (MERREM) 1 g in sodium chloride 0.9 % 100 mL IVPB  1 g Intravenous Q8H Kc, Ramesh, MD 200 mL/hr at 08/21/21 1120 1 g at 08/21/21 1120   metoprolol tartrate (LOPRESSOR) 25 mg/10 mL oral suspension 25 mg  25 mg Per Tube BID Eugenie Filler, MD   25 mg at 08/21/21 0947   ondansetron (ZOFRAN) tablet 4 mg  4 mg Oral Q6H PRN Wynetta Fines T, MD       Or   ondansetron [facility]) injection 4 mg  4 mg Intravenous Q6H PRN Wynetta Fines T, MD       pantoprazole sodium (PROTONIX) 40 mg/20 mL oral suspension 40 mg  40 mg  Per Tube Daily Eugenie Filler, MD   40 mg at 08/21/21 0947   phosphorus (K PHOS NEUTRAL) tablet 250 mg  250 mg Per Tube Daily Kc, Maren [place], MD   250 mg at 08/21/21 0948   traMADol (ULTRAM) tablet 50 mg  50 mg Oral Q12H PRN Antonieta Pert,  MD   50 mg at 08/21/21 7322     Discharge Medications: Please see discharge summary for a list of discharge medications.  Relevant Imaging Results:  Relevant Lab Results:   Additional Information    Joanne Chars, LCSW

## 2021-08-21 NOTE — Progress Notes (Signed)
Physical Therapy Treatment Patient Details Name: [name] [name] MRN: [id number] DOB: January 10, 1961 Today's Date: 08/21/2021   History of Present Illness [name] [name] is a 61 y.o. female sent to hospital for evaluation of altered mentations hypoxia and fever. Admitted for sepsis. Palliative consult during admission with family opting for PEG tub placement 6/6. PMH: failure to thrive, dysphagia secondary to moderate esophageal dysmotility (barium swallow March 2023), psoriasis arthritis, CAD status post CABG, DM-2, chronic systolic CHF LVEF 40%,    PT Comments    Pt received supine and agreeable to session with great progress towards goals, however limited by abdominal pain s/p PEG tube placement and general fatigue. Pt able to come to sitting on right EOB with mod assist with pt able to initiate BLEs toward EOB and reach for R bedrail with assist needed to scoot with bed pad toward edge and elevate trunk to full upright sitting. Pt able to maintain static sitting for ~5 mins without assist with BUE on back of chair placed in front of pt. Pt mod assist to come back to supine as pt stating max fatigue and need for bedpan and requesting back to bed. Session limited to pt stated tolerance and need for bed pan. Pt continues to benefit from skilled PT services to progress toward functional mobility goals.    Recommendations for follow up therapy are one component of a multi-disciplinary discharge planning process, led by the attending physician.  Recommendations may be updated based on patient status, additional functional criteria and insurance authorization.  Follow Up Recommendations  Skilled nursing-short term rehab (<3 hours/day)     Assistance Recommended at Discharge Frequent or constant Supervision/Assistance  Patient can return home with the following Two people to help with walking and/or transfers;Two people to help with bathing/dressing/bathroom   Equipment Recommendations  None  recommended by PT    Recommendations for Other Services       Precautions / Restrictions Precautions Precautions: Fall;Other (comment) Precaution Comments: PEG tube Restrictions Weight Bearing Restrictions: No     Mobility  Bed Mobility Overal bed mobility: Needs Assistance Bed Mobility: Supine to Sit     Supine to sit: Mod assist Sit to supine: Mod assist   General bed mobility comments: mod a for LE management and to cue pt to reach for bedrail and assist to raise trunk, mod a to bring LE back into bed and reposition    Transfers                   General transfer comment: unable    Ambulation/Gait               General Gait Details: unable   Stairs             Wheelchair Mobility    Modified Rankin (Stroke Patients Only)       Balance Overall balance assessment: Needs assistance Sitting-balance support: Bilateral upper extremity supported Sitting balance-Leahy Scale: Poor Sitting balance - Comments: able to static sit at EOB with BUE on back of chair placed in front of pt, unable to maintain for short time with Bhands on bed besdie her before comig to supine                                    Cognition Arousal/Alertness: Awake/alert Behavior During Therapy: Flat affect Overall Cognitive Status: No family/caregiver present to determine baseline cognitive functioning Area of Impairment:  Following commands, Awareness, Problem solving, Safety/judgement, Memory                     Memory: Decreased short-term memory Following Commands: Follows one step commands inconsistently, Follows one step commands with increased time Safety/Judgement: Decreased awareness of deficits Awareness: Intellectual Problem Solving: Slow processing, Decreased initiation, Difficulty sequencing, Requires verbal cues, Requires tactile cues General Comments: decreased awareness of deficits.  tangential with poor attention to tasks         Exercises      General Comments        Pertinent Vitals/Pain Pain Assessment Pain Assessment: Faces Faces Pain Scale: Hurts little more Pain Location: abdomen Pain Descriptors / Indicators: Grimacing, Sore, Guarding Pain Intervention(s): Monitored during session, Repositioned    Home Living                          Prior Function            PT Goals (current goals can now be found in the care plan section) Acute Rehab PT Goals PT Goal Formulation: With patient Time For Goal Achievement: 08/23/21    Frequency    Min 2X/week      PT Plan Current plan remains appropriate    Co-evaluation              AM-PAC PT "6 Clicks" Mobility   Outcome Measure  Help needed turning from your back to your side while in a flat bed without using bedrails?: Total Help needed moving from lying on your back to sitting on the side of a flat bed without using bedrails?: Total Help needed moving to and from a bed to a chair (including a wheelchair)?: Total Help needed standing up from a chair using your arms (e.g., wheelchair or bedside chair)?: Total Help needed to walk in hospital room?: Total Help needed climbing 3-5 steps with a railing? : Total 6 Click Score: 6    End of Session Equipment Utilized During Treatment: Oxygen Activity Tolerance: Patient limited by pain;Patient limited by fatigue Patient left: in bed;with call bell/phone within reach;with nursing/sitter in room Nurse Communication: Mobility status PT Visit Diagnosis: Muscle weakness (generalized) (M62.81);Pain;Other abnormalities of gait and mobility (R26.89) Pain - part of body:  (abdomen)     Time: 1212-1229 PT Time Calculation (min) (ACUTE ONLY): 17 min  Charges:  $Therapeutic Activity: 8-22 mins                     Ermelinda Eckert R. PTA Acute Rehabilitation Services Office: [place] 08/21/2021, 12:57 PM

## 2021-08-21 NOTE — Progress Notes (Addendum)
PROGRESS NOTE    [name] [name]  CBS:[id number] DOB: 25-May-1960 DOA: 08/05/2021 PCP: Leeroy Cha, MD    Brief Narrative:   [name] [name] is a 61 y.o. female with past medical history significant for dysphagia secondary to moderate esophageal dysmotility (barium swallow 05/2021), psoriatic arthritis, CAD s/p CABG, DM2, chronic systolic congestive heart failure (LVEF 45%), adult failure to thrive who presented to [facility] ED from [facility] on 5/22 for confusion, hypoxia and fever.  Recently hospitalized and discharged to SNF 11 days PTA after treatment for supposed empyema and pneumonia.  Also patient has lost roughly 13 kg over the last 5 months.  Patient was discharged on a pured diet despite family observing several times she had choking episodes after eating food.  In the ED, temperature 101.4 F, BP 101/59, HR 87, RR 27, SPO2 92% on 15 L nonrebreather.  Sodium 149, potassium 5.2, chloride 117, CO2 23, glucose 129, BUN 31, creatinine 1.54.  Alkaline phosphatase 223, AST 75, ALT 21, total bilirubin 1.2.  Lactic acid 3.1, WBC 12.2, hemoglobin 13.7, platelets 179.  INR 1.1.  COVID-19 PCR negative.  Influenza A/B PCR negative.  Urinalysis with moderate leukocytes, negative nitrite, many bacteria, 21-50 WBCs.  CT angiogram chest with no evidence of pulmonary embolism, no evidence of thoracic aortic dissection, noted ectasia ascending thoracic aorta measuring 3.7 cm, CAD, patchy infiltrates both lower lung fields, greatest on the right suggesting pneumonia.  Patient was started on vancomycin and cefepime.  Hospital service consulted for further evaluation and management of acute hypoxic restaurant failure secondary to pneumonia.  Assessment & Plan:    Acute hypoxic respiratory failure Aspiration pneumonia Patient presenting to ED from SNF with fever, shortness of breath and found to be hypoxic, tachypneic requiring 15 L nonrebreather on arrival.  Influenza A/B and COVID-19 PCR negative.   Lactic acid elevated 2.5.  Imaging notable for patchy infiltrates bilateral lower lung fields, greatest on right concerning for aspiration pneumonia in the setting of her known esophageal dysmotility; no pulmonary embolism.  Patient was initially started on empiric antibiotics with vancomycin and cefepime and changed to meropenem due to her underlying ESBL E. coli UTI as below.  Patient was able to be titrated off of supplemental oxygen. --Blood cultures x2 5/22: ESBL Ecoli --Blood cultures x2 6/4: No growth x 2 days --Continue meropenem --Aspiration precautions  ESBL E. coli UTI/bacteremia Urinalysis with moderate leukocytes, negative nitrite, many bacteria, 21-50 WBCs.  Urine culture and blood cultures x2 5/22 positive for ESBL E. coli. --Continue meropenem as above  Type 2 diabetes mellitus On metformin 500 mg p.o. twice daily at baseline.  Hemoglobin A1c 6.7, well controlled. --Holding metformin, likely will discontinue on discharge  CAD --Continue statin  HLD: Atorvastatin 80 mg per tube daily  Depression/anxiety: --Lexapro 10 mg per tube daily  GERD: Protonix 40 mg per tube daily  Chronic systolic congestive heart failure, compensated Essential hypertension On amlodipine 2.5 mg p.o. daily, metoprolol tartrate 25 mg PO BID at home. --Continue to hold home amlodipine --Continue metoprolol tartrate 25 mg per tube twice daily --Strict I's and O's and daily weights  Acute renal failure Etiology likely secondary to poor oral intake in the setting of dehydration and adult failure to thrive.  Creatinine 1.54 on admission.  Patient was started IV fluid hydration and initially a core track was placed to initiate tube feeds.  Core track has now been transitioned to percutaneous G-tube placement.  Creatinine now improved to 0.49.  Esophageal dysmotility Dysphagia Adult  failure to thrive Moderate protein calorie malnutrition Patient is followed by [facility] gastroenterology outpatient.   Has had EGD 08/2020 showing normal-appearing esophagus.  Barium swallow March 2023 with no evidence of stricture and notable moderate esophageal dysmotility.  Seen by gastroenterology once again during his hospitalization with no recommendations of repeating EGD at this time as this will not decrease risk of aspiration pneumonia and will not change her desire to eat; normal will also not improve her esophageal dysmotility.  Interventional radiology was consulted and patient underwent percutaneous G-tube placement on 08/20/2021. --Dietitian consulted to initiate tube feeds via G-tube that was placed 6/6  Nutrition Status: Nutrition Problem: Moderate Malnutrition Etiology: chronic illness (dysphagia) Signs/Symptoms: mild muscle depletion, moderate muscle depletion, mild fat depletion, moderate fat depletion Interventions: Refer to RD note for recommendations   Pressure injury, POA Noted right buttock stage II and right hip stage II pressure injury.  Seen by wound care.  Continue local wound care and offloading.  DVT prophylaxis: heparin injection 5,000 Units Start: 08/05/21 1545    Code Status: Full Code Family Communication:   Disposition Plan:  Level of care: Telemetry Medical Status is: Inpatient Remains inpatient appropriate because: IV antibiotics, needs up titration of tube feeds, anticipate discharge back to SNF in 1-2 days    Consultants:  Infectious disease, Dr. Juleen China - signed off 5/25 Palliative care Gastroenterology, Dr. Alessandra Bevels - signed off 5/31 Interventional radiology  Procedures:  Percutaneous G-tube placement by interventional radiology 6/6  Antimicrobials:  Vancomycin 5/22 - 5/24 Cefepime 5/22 - 5/22 Cefazolin 6/6 - 6/6 Meropenem 5/22 - 5/30, 6/5>>   Subjective: Patient seen examined bedside, resting comfortably.  No family present.  Reports her abdomen "hurts some" at G-tube insertion site.  No other specific complaints or concerns this morning.  Denies  headache, no chest pain, no shortness of breath, no nausea/vomiting/diarrhea, no cough/congestion, no generalized fatigue, no focal weakness, no paresthesias.  No acute events overnight per nursing staff.  Objective: Vitals:   08/21/21 0017 08/21/21 0441 08/21/21 0843 08/21/21 1158  BP: 129/79 122/71 126/78 113/75  Pulse: 91 92 93 85  Resp: '20 19 20 17  '$ Temp: 98.2 F (36.8 C) 98.3 F (36.8 C) 98.4 F (36.9 C) 98.1 F (36.7 C)  TempSrc: Oral Oral Oral Oral  SpO2: 100% 100% 92% 97%  Weight:        Intake/Output Summary (Last 24 hours) at 08/21/2021 1352 Last data filed at 08/21/2021 0500 Gross per 24 hour  Intake 875.92 ml  Output 250 ml  Net 625.92 ml   Filed Weights   08/15/21 0500 08/17/21 0500 08/19/21 0445  Weight: 78.5 kg 82.1 kg 77.6 kg    Examination:  Physical Exam: GEN: NAD, alert and oriented x 3, appears older than stated age, chronically ill in appearance HEENT: NCAT, PERRL, EOMI, sclera clear, MMM PULM: Breath sounds slight decreased bilateral bases, normal [place]paire effort without accessory muscle use, no wheezing/crackles, on room air with SPO2 100% at rest CV: RRR w/o M/G/R GI: abd soft, NTND, NABS, no R/G/M, noted G-tube abdomen with binder in place MSK: no peripheral edema, muscle strength globally intact 5/5 bilateral upper/lower extremities NEURO: CN II-XII intact, no focal deficits, sensation to light touch intact PSYCH: normal mood/affect Integumentary: Right hip/buttock stage II wound noted, otherwise no other concerning rashes/lesions/wounds    Data Reviewed: I have personally reviewed following labs and imaging studies  CBC: Recent Labs  Lab 08/18/21 0901 08/19/21 0101  WBC 6.6 9.7  HGB 8.9* 9.8*  HCT  28.0* 30.2*  MCV 94.3 94.4  PLT 374 665   Basic Metabolic Panel: Recent Labs  Lab 08/17/21 0300 08/18/21 0221 08/19/21 0238 08/20/21 0249 08/21/21 0402  NA 138 134* 138 138 139  K 3.9 4.2 4.3 3.7 3.8  CL 102 103 100 98 100  CO2 '29  27 28 31 '$ 32  GLUCOSE 119* 174* 104* 117* 79  BUN '11 11 12 16 12  '$ CREATININE 0.46 0.48 0.53 0.52 0.49  CALCIUM 8.9 8.6* 8.9 9.0 9.2  PHOS 3.5 3.2 3.7 3.5 3.1   GFR: Estimated Creatinine Clearance: 76.7 mL/min (by C-G formula based on SCr of 0.49 mg/dL). Liver Function Tests: Recent Labs  Lab 08/17/21 0300 08/18/21 0221 08/19/21 0238 08/20/21 0249 08/21/21 0402  ALBUMIN 2.2* 2.2* 2.1* 2.1* 2.2*   No results for input(s): LIPASE, AMYLASE in the last 168 hours. No results for input(s): AMMONIA in the last 168 hours. Coagulation Profile: Recent Labs  Lab 08/20/21 0249  INR 1.1   Cardiac Enzymes: No results for input(s): CKTOTAL, CKMB, CKMBINDEX, TROPONINI in the last 168 hours. BNP (last 3 results) No results for input(s): PROBNP in the last 8760 hours. HbA1C: No results for input(s): HGBA1C in the last 72 hours. CBG: Recent Labs  Lab 08/21/21 0016 08/21/21 0444 08/21/21 0842 08/21/21 0927 08/21/21 1138  GLUCAP 94 70 61* 85 104*   Lipid Profile: No results for input(s): CHOL, HDL, LDLCALC, TRIG, CHOLHDL, LDLDIRECT in the last 72 hours. Thyroid Function Tests: No results for input(s): TSH, T4TOTAL, FREET4, T3FREE, THYROIDAB in the last 72 hours. Anemia Panel: No results for input(s): VITAMINB12, FOLATE, FERRITIN, TIBC, IRON, RETICCTPCT in the last 72 hours. Sepsis Labs: No results for input(s): PROCALCITON, LATICACIDVEN in the last 168 hours.  Recent Results (from the past 240 hour(s))  Culture, blood (Routine X 2) w Reflex to ID Panel     Status: None (Preliminary result)   Collection Time: 08/18/21  5:00 PM   Specimen: BLOOD  Result Value Ref Range Status   Specimen Description BLOOD LEFT ANTECUBITAL  Final   Special Requests   Final    BOTTLES DRAWN AEROBIC AND ANAEROBIC Blood Culture adequate volume   Culture   Final    NO GROWTH 3 DAYS Performed at [facility], [address], [place], [place] [address]    Report Status PENDING  Incomplete   Culture, blood (Routine X 2) w Reflex to ID Panel     Status: None (Preliminary result)   Collection Time: 08/18/21  5:01 PM   Specimen: BLOOD  Result Value Ref Range Status   Specimen Description BLOOD BLOOD RIGHT HAND  Final   Special Requests   Final    BOTTLES DRAWN AEROBIC AND ANAEROBIC Blood Culture adequate volume   Culture   Final    NO GROWTH 3 DAYS Performed at [facility], [place] [address], [place], [place] [address]    Report Status PENDING  Incomplete         Radiology Studies: IR GASTROSTOMY TUBE MOD SED  Result Date: 08/20/2021 INDICATION: 61 year old female with history of severe esophageal dysmotility and failure to thrive presenting for percutaneous gastrostomy tube placement. EXAM: PERC PLACEMENT GASTROSTOMY MEDICATIONS: Ancef 2 gm IV; Antibiotics were administered within 1 hour of the procedure. ANESTHESIA/SEDATION: Versed 1 mg IV; Fentanyl 25 mcg IV Moderate Sedation Time:  15 The patient was continuously monitored during the procedure by the interventional radiology nurse under my direct supervision. CONTRAST:  51m OMNIPAQUE IOHEXOL 300 MG/ML  SOLN - administered into the gastric lumen. FLUOROSCOPY TIME:  Fluoroscopy Time: 0 minutes 18 seconds (1 mGy). COMPLICATIONS: None immediate. PROCEDURE: Informed written consent was obtained from the patient after a thorough discussion of the procedural risks, benefits and alternatives. All questions were addressed. Maximal Sterile barrier Technique was utilized including caps, mask, sterile gowns, sterile gloves, sterile drape, hand hygiene and skin antiseptic. A timeout was performed prior to the initiation of the procedure. The patient was placed on the procedure table in the supine position. Pre-procedure abdominal film confirmed visualization of the transverse colon. The patient was prepped and draped in usual sterile fashion. The stomach was insufflated with air via the indwelling nasogastric tube. Under fluoroscopy, a  puncture site was selected and local analgesia achieved with 1% lidocaine infiltrated subcutaneously. Under fluoroscopic guidance, a gastropexy needle was passed into the stomach and the T-bar suture was released. Entry into the stomach was confirmed with fluoroscopy, aspiration of air, and injection of contrast material. This was repeated with an additional gastropexy suture (for a total of 2 fasteners). At the center of these gastropexy sutures, a dermatotomy was performed. An 18 gauge needle was passed into the stomach at the site of this dermatotomy, and position within the gastric lumen again confirmed under fluoroscopy using aspiration of air and contrast injection. An Amplatz guidewire was passed through this needle and intraluminal placement within the stomach was confirmed by fluoroscopy. The needle was removed. Over the guidewire, the percutaneous tract was dilated using a 10 mm non-compliant balloon. The balloon was deflated, then pushed into the gastric lumen followed in concert by the 20 Fr gastrostomy tube. The retention balloon of the percutaneous gastrostomy tube was inflated with 20 mL of sterile water. The tube was withdrawn until the retention balloon was at the edge of the gastric lumen. The external bumper was brought to the abdominal wall. Contrast was injected through the gastrostomy tube, confirming intraluminal positioning. The patient tolerated the procedure well without any immediate post-procedural complications. IMPRESSION: Technically successful placement of 20 Fr gastrostomy tube. Ruthann Cancer, MD [facility]iology Electronically Signed   By: Ruthann Cancer M.D.   On: 08/20/2021 17:41   DG Chest Port 1 View  Result Date: 08/20/2021 CLINICAL DATA:  Shortness of breath. EXAM: PORTABLE CHEST 1 VIEW COMPARISON:  Multiple recent chest x-rays. FINDINGS: The cardiac silhouette, mediastinal hilar contours are within normal limits and  stable. Persistent interstitial and airspace process mainly in the right lung. No significant interval change. No new pulmonary findings. The feeding tube tip remains in the fundal region of the stomach. IMPRESSION: Persistent interstitial and airspace process. Electronically Signed   By: Marijo Sanes M.D.   On: 08/20/2021 08:36        Scheduled Meds:  atorvastatin  80 mg Per Tube Daily   dextrose       escitalopram  10 mg Per Tube QHS   feeding supplement (JEVITY 1.5 CAL/FIBER)  237 mL Per Tube 5 X Daily   feeding supplement (PROSource TF)  45 mL Per Tube BID   folic acid  1 mg Per Tube Daily   free water  100 mL Per Tube Q6H   free water  120 mL Per Tube 5 X Daily   gabapentin  100 mg Per Tube Q12H   heparin  5,000 Units Subcutaneous Q8H   liver oil-zinc oxide   Topical QID   metoprolol tartrate  25 mg Per Tube BID   pantoprazole sodium  40 mg Per Tube Daily   phosphorus  250 mg Per Tube Daily   Continuous Infusions:  meropenem (MERREM) IV 1 g (08/21/21 1120)     LOS: 16 days    Time spent: 51 minutes spent on chart review, discussion with nursing staff, consultants, updating family and interview/physical exam; more than 50% of that time was spent in counseling and/or coordination of care.    Astor Gentle J [place] DO [facility] Available via Epic secure chat 7am-7pm After these hours, please refer to coverage provider listed on [email or web address] 08/21/2021, 1:52 PM

## 2021-08-21 NOTE — Progress Notes (Signed)
Nutrition Follow-up   DOCUMENTATION CODES:   Non-severe (moderate) malnutrition in context of chronic illness  INTERVENTION:   Bolus tube feeding via G-tube: Jevity 1.5 237 ml (1 carton) 5 times per day. Continue Prosource TF 45 ml BID. Free water 60 ml before and after each bolus feeding.  Provides 1855 kcal, 98 gm protein, 900 ml free water daily.  Additional free water flushes 100 ml QID for a total of 1900 ml free water daily.  NUTRITION DIAGNOSIS:   Moderate Malnutrition related to chronic illness (dysphagia) as evidenced by mild muscle depletion, moderate muscle depletion, mild fat depletion, moderate fat depletion.  Ongoing   GOAL:   Patient will meet greater than or equal to 90% of their needs  Progressing  MONITOR:   Skin, Labs, Diet advancement  REASON FOR ASSESSMENT:   Malnutrition Screening Tool    ASSESSMENT:   61 yo female admitted with whole body pain. PMH includes HTN, lupus, hidradenitis axillaris, scoliosis, anxiety, CAD, stroke, STEMI, DM-2, GERD, psoriatic arthritis, FTT, dysphagia.  Patient remains NPO. G-tube was placed by IR on 6/6. Received MD Consult to resume TF via G-tube. Will order bolus feedings to begin today.  Patient has been receiving Jevity 1.5 TF continuous via Cortrak with improvement in stool consistency. BM on 6/4 documented as type 3-4. LBM 6/6, consistency not documented.   Labs reviewed.  CBG: 84-94-70  Medications reviewed and include folic acid, Protonix, K-Phos.  Weight 6/5:  77.6 kg  Diet Order:   Diet Order             Diet NPO time specified Except for: Other (See Comments)  Diet effective midnight                   EDUCATION NEEDS:   No education needs have been identified at this time  Skin:  Skin Assessment: Skin Integrity Issues: Skin Integrity Issues:: Stage II Stage II: R buttocks, R hip  Last BM:  6/6  Height:   Ht Readings from Last 1 Encounters:  07/30/21 '5\' 6"'$  (1.676 m)     Weight:   Wt Readings from Last 1 Encounters:  08/19/21 77.6 kg     BMI:  Body mass index is 27.6 kg/m.  Estimated Nutritional Needs:   Kcal:  1800-2000  Protein:  90-105 gm  Fluid:  >/= 1.8 L    Lucas Mallow RD, LDN, CNSC Please refer to Amion for contact information.

## 2021-08-21 NOTE — TOC Progression Note (Signed)
Transition of Care [facility]) - Progression Note    Patient Details  Name: [name] [name] MRN: [id number] Date of Birth: 12/12/1960  Transition of Care [facility] CM/SW Contact  Joanne Chars, LCSW Phone Number: 08/21/2021, 1:33 PM  Clinical Narrative:   CSW spoke with pt, who was aware of plan for her to return to [facility], discussed that she may be able to go in the next few days.  CSW spoke with pt son Rolin Barry by phone and updated him as well.  CSW spoke with [facility], new referral/FL2/PT note sent.  She will initiate [facility] request today.      Expected Discharge Plan: [place] Barriers to Discharge: Continued Medical Work up  Expected Discharge Plan and Services Expected Discharge Plan: [place] In-house Referral: Clinical Social Work   Post Acute Care Choice: [place] Living arrangements for the past 2 months: Single Family Home                                       Social Determinants of Health (SDOH) Interventions    Readmission Risk Interventions     View : No data to display.

## 2021-08-21 NOTE — Plan of Care (Signed)
  Problem: Health Behavior/Discharge Planning: Goal: Ability to manage health-related needs will improve Outcome: Not Progressing   Problem: Clinical Measurements: Goal: Ability to maintain clinical measurements within normal limits will improve Outcome: Not Progressing Goal: Will remain free from infection Outcome: Not Progressing Goal: Diagnostic test results will improve Outcome: Not Progressing Goal: Respiratory complications will improve Outcome: Not Progressing Goal: Cardiovascular complication will be avoided Outcome: Not Progressing   Problem: Nutrition: Goal: Adequate nutrition will be maintained Outcome: Not Progressing   Problem: Coping: Goal: Level of anxiety will decrease Outcome: Not Progressing   Problem: Elimination: Goal: Will not experience complications related to bowel motility Outcome: Not Progressing Goal: Will not experience complications related to urinary retention Outcome: Not Progressing

## 2021-08-22 DIAGNOSIS — N179 Acute kidney failure, unspecified: Secondary | ICD-10-CM | POA: Diagnosis not present

## 2021-08-22 DIAGNOSIS — R627 Adult failure to thrive: Secondary | ICD-10-CM | POA: Diagnosis not present

## 2021-08-22 DIAGNOSIS — A419 Sepsis, unspecified organism: Secondary | ICD-10-CM | POA: Diagnosis not present

## 2021-08-22 DIAGNOSIS — R1310 Dysphagia, unspecified: Secondary | ICD-10-CM

## 2021-08-22 DIAGNOSIS — R4182 Altered mental status, unspecified: Secondary | ICD-10-CM | POA: Diagnosis not present

## 2021-08-22 LAB — GLUCOSE, CAPILLARY
Glucose-Capillary: 93 mg/dL (ref 70–99)
Glucose-Capillary: 81 mg/dL (ref 70–99)
Glucose-Capillary: 130 mg/dL — ABNORMAL HIGH (ref 70–99)
Glucose-Capillary: 120 mg/dL — ABNORMAL HIGH (ref 70–99)
Glucose-Capillary: 129 mg/dL — ABNORMAL HIGH (ref 70–99)

## 2021-08-22 MED ORDER — GUAIFENESIN 100 MG/5ML PO LIQD
15.0000 mL | Freq: Four times a day (QID) | ORAL | Status: DC
  Administered 2021-08-22 – 2021-08-26 (×16): 15 mL
  Filled 2021-08-22 (×15): qty 15

## 2021-08-22 MED ORDER — AMOXICILLIN-POT CLAVULANATE 875-125 MG PO TABS
1.0000 | ORAL_TABLET | Freq: Two times a day (BID) | ORAL | Status: DC
Start: 2021-08-23 — End: 2021-08-25
  Administered 2021-08-23 – 2021-08-24 (×4): 1
  Filled 2021-08-22 (×4): qty 1

## 2021-08-22 MED ORDER — OXYCODONE HCL 5 MG PO TABS
5.0000 mg | ORAL_TABLET | ORAL | Status: DC | PRN
  Administered 2021-08-22 – 2021-08-26 (×4): 5 mg via ORAL
  Filled 2021-08-22 (×4): qty 1

## 2021-08-22 NOTE — Progress Notes (Signed)
PROGRESS NOTE    [name] [name]  NGE:[id number] DOB: Mar 04, 1961 DOA: 08/05/2021 PCP: Leeroy Cha, MD    Brief Narrative:   [name] [name] is a 61 y.o. female with past medical history significant for dysphagia secondary to moderate esophageal dysmotility (barium swallow 05/2021), psoriatic arthritis, CAD s/p CABG, DM2, chronic systolic congestive heart failure (LVEF 45%), adult failure to thrive who presented to [facility] ED from [facility] SNF on 5/22 for confusion, hypoxia and fever.  Recently hospitalized and discharged to SNF 11 days PTA after treatment for supposed empyema and pneumonia.  Also patient has lost roughly 13 kg over the last 5 months.  Patient was discharged on a pured diet despite family observing several times she had choking episodes after eating food.  In the ED, temperature 101.4 F, BP 101/59, HR 87, RR 27, SPO2 92% on 15 L nonrebreather.  Sodium 149, potassium 5.2, chloride 117, CO2 23, glucose 129, BUN 31, creatinine 1.54.  Alkaline phosphatase 223, AST 75, ALT 21, total bilirubin 1.2.  Lactic acid 3.1, WBC 12.2, hemoglobin 13.7, platelets 179.  INR 1.1.  COVID-19 PCR negative.  Influenza A/B PCR negative.  Urinalysis with moderate leukocytes, negative nitrite, many bacteria, 21-50 WBCs.  CT angiogram chest with no evidence of pulmonary embolism, no evidence of thoracic aortic dissection, noted ectasia ascending thoracic aorta measuring 3.7 cm, CAD, patchy infiltrates both lower lung fields, greatest on the right suggesting pneumonia.  Patient was started on vancomycin and cefepime.  Hospital service consulted for further evaluation and management of acute hypoxic restaurant failure secondary to pneumonia.  Assessment & Plan:    Acute hypoxic respiratory failure Aspiration pneumonia Patient presenting to ED from SNF with fever, shortness of breath and found to be hypoxic, tachypneic requiring 15 L nonrebreather on arrival.  Influenza A/B and COVID-19 PCR negative.   Lactic acid elevated 2.5.  Imaging notable for patchy infiltrates bilateral lower lung fields, greatest on right concerning for aspiration pneumonia in the setting of her known esophageal dysmotility; no pulmonary embolism.  Patient was initially started on empiric antibiotics with vancomycin and cefepime and changed to meropenem due to her underlying ESBL E. coli UTI as below.  Patient was able to be titrated off of supplemental oxygen. --Blood cultures x2 5/22: ESBL Ecoli --Blood cultures x2 6/4: No growth x 3 days --Continue meropenem --Aspiration precautions  ESBL E. coli UTI/bacteremia Urinalysis with moderate leukocytes, negative nitrite, many bacteria, 21-50 WBCs.  Urine culture and blood cultures x2 5/22 positive for ESBL E. coli. --Continue meropenem as above  Type 2 diabetes mellitus On metformin 500 mg p.o. twice daily at baseline.  Hemoglobin A1c 6.7, well controlled. --Holding metformin, likely will discontinue on discharge  CAD --Continue statin  HLD: Atorvastatin 80 mg per tube daily  Depression/anxiety: --Lexapro 10 mg per tube daily  GERD: Protonix 40 mg per tube daily  Chronic systolic congestive heart failure, compensated Essential hypertension On amlodipine 2.5 mg p.o. daily, metoprolol tartrate 25 mg PO BID at home. --Continue to hold home amlodipine --Continue metoprolol tartrate 25 mg per tube twice daily --Strict I's and O's and daily weights  Acute renal failure Etiology likely secondary to poor oral intake in the setting of dehydration and adult failure to thrive.  Creatinine 1.54 on admission.  Patient was started IV fluid hydration and initially a core track was placed to initiate tube feeds.  Core track has now been transitioned to percutaneous G-tube placement.  Creatinine now improved to 0.49.  Esophageal dysmotility Dysphagia Adult  failure to thrive Moderate protein calorie malnutrition Patient is followed by [facility] gastroenterology outpatient.   Has had EGD 08/2020 showing normal-appearing esophagus.  Barium swallow March 2023 with no evidence of stricture and notable moderate esophageal dysmotility.  Seen by gastroenterology once again during his hospitalization with no recommendations of repeating EGD at this time as this will not decrease risk of aspiration pneumonia and will not change her desire to eat; normal will also not improve her esophageal dysmotility.  Interventional radiology was consulted and patient underwent percutaneous G-tube placement on 08/20/2021. --Dietitian consulted to resume tube feeds via G-tube that was placed 6/6  Bolus tube feeding via G-tube: Jevity 1.5 237 ml (1 carton) 5 times per day. Continue Prosource TF 45 ml BID. Free water 60 ml before and after each bolus feeding.   Provides 1855 kcal, 98 gm protein, 900 ml free water daily.   Additional free water flushes 100 ml QID for a total of 1900 ml free water daily.  Nutrition Status: Nutrition Problem: Moderate Malnutrition Etiology: chronic illness (dysphagia) Signs/Symptoms: mild muscle depletion, moderate muscle depletion, mild fat depletion, moderate fat depletion Interventions: Refer to RD note for recommendations   Pressure injury, POA Noted right buttock stage II and right hip stage II pressure injury.  Seen by wound care.  Continue local wound care and offloading.  DVT prophylaxis: heparin injection 5,000 Units Start: 08/05/21 1545    Code Status: Full Code Family Communication:   Disposition Plan:  Level of care: Telemetry Medical Status is: Inpatient Remains inpatient appropriate because: Pending insurance authorization for SNF, [facility]    Consultants:  Infectious disease, Dr. Juleen China - signed off 5/25 Palliative care Gastroenterology, Dr. Alessandra Bevels - signed off 5/31 Interventional radiology  Procedures:  Percutaneous G-tube placement by interventional radiology 6/6  Antimicrobials:  Vancomycin 5/22 - 5/24 Cefepime 5/22 -  5/22 Cefazolin 6/6 - 6/6 Meropenem 5/22 - 5/30, 6/5>>   Subjective: Patient seen examined bedside, resting comfortably.  No family present.  Reports that she feels cold in the room this morning and requesting the covers over her hands and feet.  No other questions or concerns at this time.  Tolerating bolus tube feeds.  Discussed with social work, anticipate discharge back to SNF once insurance authorization back. Denies headache, no chest pain, no shortness of breath, no nausea/vomiting/diarrhea, no cough/congestion, no generalized fatigue, no focal weakness, no paresthesias.  No acute events overnight per nursing staff.  Objective: Vitals:   08/21/21 2340 08/22/21 0408 08/22/21 0728 08/22/21 1000  BP: 124/70 126/85 (!) 143/82   Pulse: 93 86    Resp: 15 11    Temp: 98.5 F (36.9 C) 98.5 F (36.9 C)  98.2 F (36.8 C)  TempSrc: Oral Oral  Oral  SpO2: 100% 100%    Weight:        Intake/Output Summary (Last 24 hours) at 08/22/2021 1206 Last data filed at 08/22/2021 0400 Gross per 24 hour  Intake 1565.41 ml  Output 0 ml  Net 1565.41 ml   Filed Weights   08/15/21 0500 08/17/21 0500 08/19/21 0445  Weight: 78.5 kg 82.1 kg 77.6 kg    Examination:  Physical Exam: GEN: NAD, alert and oriented x 3, appears older than stated age, chronically ill in appearance HEENT: NCAT, PERRL, EOMI, sclera clear, MMM PULM: Breath sounds slight decreased bilateral bases, normal [place] effort without accessory muscle use, no wheezing/crackles, on room air with SPO2 100% at rest CV: RRR w/o M/G/R GI: abd soft, NTND, NABS, no R/G/M, noted  G-tube abdomen with binder in place MSK: no peripheral edema, muscle strength globally intact 5/5 bilateral upper/lower extremities NEURO: CN II-XII intact, no focal deficits, sensation to light touch intact PSYCH: normal mood/affect Integumentary: Right hip/buttock stage II wound noted, otherwise no other concerning rashes/lesions/wounds    Data Reviewed: I have  personally reviewed following labs and imaging studies  CBC: Recent Labs  Lab 08/18/21 0901 08/19/21 0101  WBC 6.6 9.7  HGB 8.9* 9.8*  HCT 28.0* 30.2*  MCV 94.3 94.4  PLT 374 124   Basic Metabolic Panel: Recent Labs  Lab 08/17/21 0300 08/18/21 0221 08/19/21 0238 08/20/21 0249 08/21/21 0402  NA 138 134* 138 138 139  K 3.9 4.2 4.3 3.7 3.8  CL 102 103 100 98 100  CO2 '29 27 28 31 '$ 32  GLUCOSE 119* 174* 104* 117* 79  BUN '11 11 12 16 12  '$ CREATININE 0.46 0.48 0.53 0.52 0.49  CALCIUM 8.9 8.6* 8.9 9.0 9.2  PHOS 3.5 3.2 3.7 3.5 3.1   GFR: Estimated Creatinine Clearance: 76.7 mL/min (by C-G formula based on SCr of 0.49 mg/dL). Liver Function Tests: Recent Labs  Lab 08/17/21 0300 08/18/21 0221 08/19/21 0238 08/20/21 0249 08/21/21 0402  ALBUMIN 2.2* 2.2* 2.1* 2.1* 2.2*   No results for input(s): "LIPASE", "AMYLASE" in the last 168 hours. No results for input(s): "AMMONIA" in the last 168 hours. Coagulation Profile: Recent Labs  Lab 08/20/21 0249  INR 1.1   Cardiac Enzymes: No results for input(s): "CKTOTAL", "CKMB", "CKMBINDEX", "TROPONINI" in the last 168 hours. BNP (last 3 results) No results for input(s): "PROBNP" in the last 8760 hours. HbA1C: No results for input(s): "HGBA1C" in the last 72 hours. CBG: Recent Labs  Lab 08/21/21 1545 08/21/21 1955 08/21/21 2339 08/22/21 0429 08/22/21 0728  GLUCAP 121* 145* 160* 93 81   Lipid Profile: No results for input(s): "CHOL", "HDL", "LDLCALC", "TRIG", "CHOLHDL", "LDLDIRECT" in the last 72 hours. Thyroid Function Tests: No results for input(s): "TSH", "T4TOTAL", "FREET4", "T3FREE", "THYROIDAB" in the last 72 hours. Anemia Panel: No results for input(s): "VITAMINB12", "FOLATE", "FERRITIN", "TIBC", "IRON", "RETICCTPCT" in the last 72 hours. Sepsis Labs: No results for input(s): "PROCALCITON", "LATICACIDVEN" in the last 168 hours.  Recent Results (from the past 240 hour(s))  Culture, blood (Routine X 2) w Reflex  to ID Panel     Status: None (Preliminary result)   Collection Time: 08/18/21  5:00 PM   Specimen: BLOOD  Result Value Ref Range Status   Specimen Description BLOOD LEFT ANTECUBITAL  Final   Special Requests   Final    BOTTLES DRAWN AEROBIC AND ANAEROBIC Blood Culture adequate volume   Culture   Final    NO GROWTH 3 DAYS Performed at [facility], [address] [place], [place] [address]    Report Status PENDING  Incomplete  Culture, blood (Routine X 2) w Reflex to ID Panel     Status: None (Preliminary result)   Collection Time: 08/18/21  5:01 PM   Specimen: BLOOD  Result Value Ref Range Status   Specimen Description BLOOD BLOOD RIGHT HAND  Final   Special Requests   Final    BOTTLES DRAWN AEROBIC AND ANAEROBIC Blood Culture adequate volume   Culture   Final    NO GROWTH 3 DAYS Performed at [facility], [place] [address] [place], [place] [address]    Report Status PENDING  Incomplete         Radiology Studies: IR GASTROSTOMY TUBE MOD SED  Result Date: 08/20/2021 INDICATION: 61 year old female with history of severe esophageal dysmotility and failure to thrive presenting for percutaneous gastrostomy tube placement. EXAM: PERC PLACEMENT GASTROSTOMY MEDICATIONS: Ancef 2 gm IV; Antibiotics were administered within 1 hour of the procedure. ANESTHESIA/SEDATION: Versed 1 mg IV; Fentanyl 25 mcg IV Moderate Sedation Time:  15 The patient was continuously monitored during the procedure by the interventional radiology nurse under my direct supervision. CONTRAST:  70m OMNIPAQUE IOHEXOL 300 MG/ML SOLN - administered into the gastric lumen. FLUOROSCOPY TIME:  Fluoroscopy Time: 0 minutes 18 seconds (1 mGy). COMPLICATIONS: None immediate. PROCEDURE: Informed written consent was obtained from the patient after a thorough discussion of the procedural risks, benefits and alternatives. All questions were addressed. Maximal Sterile barrier Technique was utilized including caps, mask,  sterile gowns, sterile gloves, sterile drape, hand hygiene and skin antiseptic. A timeout was performed prior to the initiation of the procedure. The patient was placed on the procedure table in the supine position. Pre-procedure abdominal film confirmed visualization of the transverse colon. The patient was prepped and draped in usual sterile fashion. The stomach was insufflated with air via the indwelling nasogastric tube. Under fluoroscopy, a puncture site was selected and local analgesia achieved with 1% lidocaine infiltrated subcutaneously. Under fluoroscopic guidance, a gastropexy needle was passed into the stomach and the T-bar suture was released. Entry into the stomach was confirmed with fluoroscopy, aspiration of air, and injection of contrast material. This was repeated with an additional gastropexy suture (for a total of 2 fasteners). At the center of these gastropexy sutures, a dermatotomy was performed. An 18 gauge needle was passed into the stomach at the site of this dermatotomy, and position within the gastric lumen again confirmed under fluoroscopy using aspiration of air and contrast injection. An Amplatz guidewire was passed through this needle and intraluminal placement within the stomach was confirmed by fluoroscopy. The needle was removed. Over the guidewire, the percutaneous tract was dilated using a 10 mm non-compliant balloon. The balloon was deflated, then pushed into the gastric lumen followed in concert by the 20 Fr gastrostomy tube. The retention balloon of the percutaneous gastrostomy tube was inflated with 20 mL of sterile water. The tube was withdrawn until the retention balloon was at the edge of the gastric lumen. The external bumper was brought to the abdominal wall. Contrast was injected through the gastrostomy tube, confirming intraluminal positioning. The patient tolerated the procedure well without any immediate post-procedural complications. IMPRESSION: Technically successful  placement of 20 Fr gastrostomy tube. DRuthann Cancer MD [facility] Electronically Signed   By: DRuthann CancerM.D.   On: 08/20/2021 17:41        Scheduled Meds:  atorvastatin  80 mg Per Tube Daily   chlorhexidine  15 mL Mouth Rinse BID   escitalopram  10 mg Per Tube QHS   feeding supplement (JEVITY 1.5 CAL/FIBER)  237 mL Per Tube 5 X Daily   feeding supplement (PROSource TF)  45 mL Per Tube BID   folic acid  1 mg Per Tube Daily   free water  100 mL Per Tube Q6H   free water  120 mL Per Tube 5 X Daily   gabapentin  100 mg Per Tube Q12H   heparin  5,000 Units Subcutaneous Q8H   liver oil-zinc oxide   Topical QID   mouth rinse  15 mL Mouth Rinse q12n4p   metoprolol tartrate  25 mg Per Tube BID   pantoprazole sodium  40  mg Per Tube Daily   phosphorus  250 mg Per Tube Daily   Continuous Infusions:  sodium chloride 10 mL/hr at 08/22/21 0313   meropenem (MERREM) IV 1 g (08/22/21 1045)     LOS: 17 days    Time spent: 48 minutes spent on chart review, discussion with nursing staff, consultants, updating family and interview/physical exam; more than 50% of that time was spent in counseling and/or coordination of care.    Kimaya Whitlatch J [place] DO [facility] Available via Epic secure chat 7am-7pm After these hours, please refer to coverage provider listed on [email or web address] 08/22/2021, 12:06 PM

## 2021-08-22 NOTE — Progress Notes (Signed)
Palliative Medicine Progress Note   Patient Name: [name] [name]       Date: 08/22/2021 DOB: 09-30-1960  Age: 61 y.o. MRN#: [id number] Attending Physician: [place] Eric J, DO Primary Care Physician: Leeroy Cha, MD Admit Date: 08/05/2021    HPI/Patient Profile: 61 y.o. female  with past medical history of failure to thrive, dysphagia secondary to moderate esophageal dysmotility (barium swallow March 2023), psoriasis arthritis, CAD status post CABG, DM-2, and chronic systolic CHF LVEF 26% admitted on 08/05/2021 with AMS and fever.  Patient recently hospitalized with pneumonia and was discharged to a nursing home.  Patient has lost about 13 kg in the last 5 months per chart review.  Family reports several choking episodes at rehab.  This admission, patient diagnosed with severe sepsis secondary to ESBL E. coli bacteremia and aspiration pneumonia.  Patient with albumin less than 1.5 and ongoing severe protein calorie malnutrition.  PMT consulted to discuss goals of care.  Subjective: Chart reviewed. Patient is status post G-tube placement 6/6.    I went to see patient at bedside.  She is alert and seems to be in good spirits.  She tells me she is doing "pretty good" except for some pain where the G-tube was placed.  She is holding a cup of ice chips and expresses grateful to be able to have this.  Discussed that she may eventually get strong enough to be able to eat and drink for pleasure, however there would likely always be risk for aspiration.  Garrett verbalizes understanding and indicates that she is willing to do what is necessary to improve.  I later spoke with her son [name] by phone.  Provided an update on his mother's current clinical condition.  Discussed that if his mother can become  stronger, she may eventually be able to eat and drink for pleasure but that it is unlikely she would ever be able to have enough oral intake to sustain herself.  Emphasized there would likely always be a risk of aspiration.  Also emphasized that she will likely need a feeding tube for the rest of her life. [name] verbalizes understanding. I offered and explained the option for an outpatient palliative services to check-in periodically with patient and family and continue goals of care discussions.  [name] is agreeable to outpatient palliative referral.   Objective:  Physical Exam Vitals reviewed.  Constitutional:      General: She is not in acute distress.    Appearance: She is ill-appearing.     Comments: Temporal wasting  Pulmonary:     Effort: Pulmonary effort is normal.  Neurological:     Mental Status: She is alert and oriented to person, place, and time.     Motor: Weakness present.             Vital Signs: BP (!) 143/82 (BP Location: Left Arm)   Pulse 86   Temp 98.5 F (36.9 C) (Oral)   Resp 11   Wt 77.6 kg   SpO2 100%   BMI 27.60 kg/m  SpO2: SpO2: 100 % O2 Device: O2 Device: Nasal Cannula   LBM: Last BM Date : 08/21/21     Palliative Assessment/Data: PPS 30%     Palliative Medicine Assessment & Plan   Assessment: Principal Problem:   Sepsis ([place]) Active Problems:   Rheumatoid arthritis (HCC)   Gastroesophageal reflux disease without esophagitis   AMS (altered mental status)   Hypokalemia   Hypernatremia   Pressure injury of skin   Malnutrition of moderate degree   AKI (acute kidney injury) ([place])   Adult failure to thrive syndrome   Dysphagia   Chronic systolic CHF (congestive heart failure) ([place])   Infection due to ESBL-producing Escherichia coli   Hypophosphatemia   Folate deficiency    Recommendations/Plan: Full code full scope Continue all current interventions Patient and family are hopeful for improvement Short-term goal is for rehab to  improve functional status Outpatient palliative referral - [facility]   Prognosis:  Unable to determine  Discharge Planning: [facility] for rehab with Palliative care service follow-up    Thank you for allowing the Palliative Medicine Team to assist in the care of this patient.   MDM - moderate   Lavena Bullion, NP   Please contact Palliative Medicine Team phone at [phone] for questions and concerns.  For individual providers, please see AMION.

## 2021-08-22 NOTE — Plan of Care (Signed)

## 2021-08-23 ENCOUNTER — Inpatient Hospital Stay (HOSPITAL_COMMUNITY): Payer: Medicare HMO

## 2021-08-23 LAB — URINALYSIS, ROUTINE W REFLEX MICROSCOPIC
Bilirubin Urine: NEGATIVE
Glucose, UA: NEGATIVE mg/dL
Hgb urine dipstick: NEGATIVE
Ketones, ur: NEGATIVE mg/dL
Nitrite: NEGATIVE
Protein, ur: NEGATIVE mg/dL
Specific Gravity, Urine: 1.012 (ref 1.005–1.030)
pH: 6 (ref 5.0–8.0)

## 2021-08-23 LAB — BASIC METABOLIC PANEL
Anion gap: 8 (ref 5–15)
BUN: 11 mg/dL (ref 8–23)
CO2: 31 mmol/L (ref 22–32)
Calcium: 8.5 mg/dL — ABNORMAL LOW (ref 8.9–10.3)
Chloride: 98 mmol/L (ref 98–111)
Creatinine, Ser: 0.49 mg/dL (ref 0.44–1.00)
GFR, Estimated: >60 mL/min (ref >60)
Glucose, Bld: 115 mg/dL — ABNORMAL HIGH (ref 70–99)
Potassium: 4 mmol/L (ref 3.5–5.1)
Sodium: 137 mmol/L (ref 135–145)

## 2021-08-23 LAB — GLUCOSE, CAPILLARY
Glucose-Capillary: 109 mg/dL — ABNORMAL HIGH (ref 70–99)
Glucose-Capillary: 145 mg/dL — ABNORMAL HIGH (ref 70–99)
Glucose-Capillary: 91 mg/dL (ref 70–99)
Glucose-Capillary: 97 mg/dL (ref 70–99)

## 2021-08-23 LAB — CBC
HCT: 28.8 % — ABNORMAL LOW (ref 36.0–46.0)
Hemoglobin: 9.1 g/dL — ABNORMAL LOW (ref 12.0–15.0)
MCH: 29.7 pg (ref 26.0–34.0)
MCHC: 31.6 g/dL (ref 30.0–36.0)
MCV: 94.1 fL (ref 80.0–100.0)
Platelets: 429 10*3/uL — ABNORMAL HIGH (ref 150–400)
RBC: 3.06 MIL/uL — ABNORMAL LOW (ref 3.87–5.11)
RDW: 16.2 % — ABNORMAL HIGH (ref 11.5–15.5)
WBC: 6.2 10*3/uL (ref 4.0–10.5)
nRBC: 0 % (ref 0.0–0.2)

## 2021-08-23 LAB — CULTURE, BLOOD (ROUTINE X 2)
Culture: NO GROWTH
Culture: NO GROWTH
Special Requests: ADEQUATE
Special Requests: ADEQUATE

## 2021-08-23 LAB — PROCALCITONIN: Procalcitonin: <0.1 ng/mL

## 2021-08-23 LAB — D-DIMER, QUANTITATIVE: D-Dimer, Quant: 2.51 ug/mL-FEU — ABNORMAL HIGH (ref 0.00–0.50)

## 2021-08-23 MED ORDER — GABAPENTIN 250 MG/5ML PO SOLN: 100.0000 mg | Freq: Two times a day (BID) | ORAL | 12 refills | Status: AC

## 2021-08-23 MED ORDER — ATORVASTATIN CALCIUM 80 MG PO TABS: 80.0000 mg | ORAL_TABLET | Freq: Every day | ORAL | Status: AC

## 2021-08-23 MED ORDER — SODIUM CHLORIDE 0.9 % IV SOLN: INTRAVENOUS | Status: DC

## 2021-08-23 MED ORDER — ASPIRIN 81 MG PO CHEW: 81.0000 mg | CHEWABLE_TABLET | Freq: Every day | ORAL | 0 refills | Status: AC

## 2021-08-23 MED ORDER — SODIUM CHLORIDE 0.9 % IV BOLUS
500.0000 mL | Freq: Once | INTRAVENOUS | Status: AC
  Administered 2021-08-23: 500 mL via INTRAVENOUS

## 2021-08-23 MED ORDER — ESCITALOPRAM OXALATE 10 MG PO TABS: 10.0000 mg | ORAL_TABLET | Freq: Every day | ORAL | Status: AC

## 2021-08-23 MED ORDER — PROSOURCE TF PO LIQD: 45.0000 mL | Freq: Two times a day (BID) | ORAL | Status: AC

## 2021-08-23 MED ORDER — FREE WATER: 120.0000 mL | Freq: Every day | Status: AC

## 2021-08-23 MED ORDER — AMOXICILLIN-POT CLAVULANATE 875-125 MG PO TABS: 1.0000 | ORAL_TABLET | Freq: Two times a day (BID) | ORAL | 0 refills | Status: DC

## 2021-08-23 MED ORDER — FOLIC ACID 1 MG PO TABS: 1.0000 mg | ORAL_TABLET | Freq: Every day | ORAL | Status: AC

## 2021-08-23 MED ORDER — PANTOPRAZOLE SODIUM 40 MG PO PACK
40.0000 mg | PACK | Freq: Every day | ORAL | Status: AC
Start: 2021-08-23 — End: ?

## 2021-08-23 MED ORDER — ACETAMINOPHEN 160 MG/5ML PO SOLN
650.0000 mg | Freq: Four times a day (QID) | ORAL | Status: DC | PRN
  Administered 2021-08-23: 650 mg via ORAL
  Filled 2021-08-23: qty 20.3

## 2021-08-23 MED ORDER — GUAIFENESIN 100 MG/5ML PO LIQD: 15.0000 mL | Freq: Four times a day (QID) | ORAL | 0 refills | Status: AC | PRN

## 2021-08-23 MED ORDER — JEVITY 1.5 CAL/FIBER PO LIQD: 237.0000 mL | Freq: Every day | ORAL | Status: AC

## 2021-08-23 MED ORDER — FREE WATER: 100.0000 mL | Freq: Four times a day (QID) | Status: AC

## 2021-08-23 MED ORDER — METOPROLOL TARTRATE 25 MG/10 ML ORAL SUSPENSION: 25.0000 mg | Freq: Two times a day (BID) | ORAL | Status: AC

## 2021-08-23 MED ORDER — BISACODYL 10 MG RE SUPP: 10.0000 mg | Freq: Every day | RECTAL | 0 refills | Status: AC | PRN

## 2021-08-23 MED ORDER — ACETAMINOPHEN 650 MG RE SUPP: 650.0000 mg | Freq: Four times a day (QID) | RECTAL | 0 refills | Status: AC | PRN

## 2021-08-23 MED ORDER — K PHOS MONO-SOD PHOS DI & MONO 155-852-130 MG PO TABS: 250.0000 mg | ORAL_TABLET | Freq: Every day | ORAL | Status: AC

## 2021-08-23 NOTE — Progress Notes (Signed)
Physical Therapy Treatment Patient Details Name: [name] [name] MRN: [id number] DOB: 17-Jun-1960 Today's Date: 08/23/2021   History of Present Illness [name] [name] is a 61 y.o. female sent to hospital for evaluation of altered mentations hypoxia and fever. Admitted for sepsis. Palliative consult during admission with family opting for PEG tub placement 6/6. PMH: failure to thrive, dysphagia secondary to moderate esophageal dysmotility (barium swallow March 2023), psoriasis arthritis, CAD status post CABG, DM-2, chronic systolic CHF LVEF 72%,    PT Comments    Pt received supine with HOB elevated for session focused on bed mobility and sitting tolerance. Max multimodal cues for pt to initiate LE movement to EOB with poor return. Max assist for pt to bring UEs to R bedrail to begin to bring trunk to sitting, pt unable to maintain and continuation of attempts deferred 2/2 safety and waxing/waning of pt cognition/attention throughout, RN notified at end of session of lack of engagement this session. Pt needing max assist to complete LE therex for increased ROM. Current plan remains appropriate to address deficits and maximize functional independence and decrease caregiver burden. Pt continues to benefit from skilled PT services to progress toward functional mobility goals.    Recommendations for follow up therapy are one component of a multi-disciplinary discharge planning process, led by the attending physician.  Recommendations may be updated based on patient status, additional functional criteria and insurance authorization.  Follow Up Recommendations  Skilled nursing-short term rehab (<3 hours/day)     Assistance Recommended at Discharge Frequent or constant Supervision/Assistance  Patient can return home with the following Two people to help with walking and/or transfers;Two people to help with bathing/dressing/bathroom   Equipment Recommendations  None recommended by PT    Recommendations  for Other Services       Precautions / Restrictions Precautions Precautions: Fall;Other (comment) Precaution Comments: PEG tube Restrictions Weight Bearing Restrictions: No     Mobility  Bed Mobility Overal bed mobility: Needs Assistance             General bed mobility comments: max multimodal cues to initiate LE movement to EOB with poor return, assist to guide pt UEs to right bedrail to begin to transisiton from supine to sit with pt unable to maintain. further attempts defered 2/2 waxing/waning of pt cognition throughout session    Transfers                        Ambulation/Gait                   Stairs             Wheelchair Mobility    Modified Rankin (Stroke Patients Only)       Balance Overall balance assessment: Needs assistance Sitting-balance support: Bilateral upper extremity supported Sitting balance-Leahy Scale: Poor Sitting balance - Comments: able to static sit at EOB with BUE on back of chair placed in front of pt, unable to maintain for short time with Bhands on bed besdie her before comig to supine                                    Cognition Arousal/Alertness: Awake/alert Behavior During Therapy: Flat affect Overall Cognitive Status: No family/caregiver present to determine baseline cognitive functioning  Following Commands: Follows one step commands inconsistently, Follows one step commands with increased time                Exercises General Exercises - Lower Extremity Ankle Circles/Pumps: AROM, Right, Left, 20 reps, Supine Hip ABduction/ADduction: AROM, AAROM, Right, Left, 10 reps, Supine Straight Leg Raises: PROM, Both, 5 reps, Supine Hip Flexion/Marching: PROM, Right, Left, 5 reps, Supine    General Comments        Pertinent Vitals/Pain Pain Assessment Pain Assessment: Faces Faces Pain Scale: Hurts little more Pain Location: pt. said "everywhere"  and was unable to specify pain location Pain Descriptors / Indicators: Grimacing, Sore, Guarding Pain Intervention(s): Limited activity within patient's tolerance, Monitored during session    Home Living                          Prior Function            PT Goals (current goals can now be found in the care plan section) Acute Rehab PT Goals PT Goal Formulation: With patient Time For Goal Achievement: 08/23/21    Frequency    Min 2X/week      PT Plan Current plan remains appropriate    Co-evaluation              AM-PAC PT "6 Clicks" Mobility   Outcome Measure  Help needed turning from your back to your side while in a flat bed without using bedrails?: Total Help needed moving from lying on your back to sitting on the side of a flat bed without using bedrails?: Total Help needed moving to and from a bed to a chair (including a wheelchair)?: Total Help needed standing up from a chair using your arms (e.g., wheelchair or bedside chair)?: Total Help needed to walk in hospital room?: Total Help needed climbing 3-5 steps with a railing? : Total 6 Click Score: 6    End of Session Equipment Utilized During Treatment: Oxygen Activity Tolerance: Patient limited by fatigue Patient left: in bed;with call bell/phone within reach Nurse Communication: Mobility status PT Visit Diagnosis: Muscle weakness (generalized) (M62.81);Pain;Other abnormalities of gait and mobility (R26.89) Pain - Right/Left:  (bilateral, all over) Pain - part of body:  (abdomen)     Time: 1129-1140 PT Time Calculation (min) (ACUTE ONLY): 11 min  Charges:  $Therapeutic Activity: 8-22 mins                     Sahmya Arai R. PTA Acute Rehabilitation Services Office: [place] 08/23/2021, 1:26 PM

## 2021-08-23 NOTE — TOC Transition Note (Addendum)
Transition of Care [facility]) - CM/SW Discharge Note   Patient Details  Name: LAYAH SKOUSEN MRN: [id number] Date of Birth: [birth year]  Transition of Care [facility]) CM/SW Contact:  Joanne Chars, LCSW Phone Number: 08/23/2021, 10:37 AM   Clinical Narrative:  Pt discharging to [place].  RN call [phone] for report.     1355: DC cancelled, PTAR cancelled, son [name] informed.   Final next level of care: Skilled Nursing Facility Barriers to Discharge: Barriers Resolved   Patient Goals and CMS Choice     Choice offered to / list presented to : Adult Children (son Rolin Barry)  Discharge Placement              Patient chooses bed at:  [facility]) Patient to be transferred to facility by: [place] Name of family member notified: son [name] Patient and family notified of of transfer: 08/23/21  Discharge Plan and Services In-house Referral: Clinical Social Work   Post Acute Care Choice: [place]                               Social Determinants of Health (SDOH) Interventions     Readmission Risk Interventions     No data to display

## 2021-08-23 NOTE — Progress Notes (Signed)
Pt needs new IV for CT 20g or bigger. IV team was consulted. Night RN was informed to call CT scan once IV was placed.

## 2021-08-23 NOTE — Progress Notes (Signed)
Occupational Therapy Treatment Patient Details Name: [name] [name] MRN: [id number] DOB: 25-Nov-1960 Today's Date: 08/23/2021   History of present illness [name] [name] is a 61 y.o. female sent to hospital for evaluation of altered mentations hypoxia and fever. Admitted for sepsis. Palliative consult during admission with family opting for PEG tub placement 6/6. PMH: failure to thrive, dysphagia secondary to moderate esophageal dysmotility (barium swallow March 2023), psoriasis arthritis, CAD status post CABG, DM-2, chronic systolic CHF LVEF 21%,   OT comments  Pt. Seen for skilled OT treatment with focus on HEP B UES.  Pt. With hob elevated in seated position.  Cues and intermittent hand over hand assistance provided for tech. During completion of B UE exercises.  Max hand over hand assistance for washing face with R UE.  Current d/c recommendations remain appropriate.   Recommendations for follow up therapy are one component of a multi-disciplinary discharge planning process, led by the attending physician.  Recommendations may be updated based on patient status, additional functional criteria and insurance authorization.    Follow Up Recommendations  Skilled nursing-short term rehab (<3 hours/day)    Assistance Recommended at Discharge Frequent or constant Supervision/Assistance  Patient can return home with the following  A lot of help with walking and/or transfers;A lot of help with bathing/dressing/bathroom;Direct supervision/assist for medications management;Direct supervision/assist for financial management   Equipment Recommendations  Wheelchair (measurements OT);Wheelchair cushion (measurements OT);Hospital bed    Recommendations for Other Services      Precautions / Restrictions Precautions Precautions: Fall;Other (comment) Precaution Comments: PEG tube       Mobility Bed Mobility                    Transfers                         Balance                                            ADL either performed or assessed with clinical judgement   ADL                                              Extremity/Trunk Assessment              Vision       Perception     Praxis      Cognition Arousal/Alertness: Awake/alert Behavior During Therapy: Flat affect Overall Cognitive Status: No family/caregiver present to determine baseline cognitive functioning                         Following Commands: Follows one step commands inconsistently, Follows one step commands with increased time                Exercises General Exercises - Upper Extremity Shoulder Flexion: AROM, Both, 10 reps, Supine Shoulder ABduction: AROM, Both, 10 reps, Supine Elbow Flexion: AROM, Both, 10 reps, Supine Elbow Extension: AROM, Both, 10 reps, Supine    Shoulder Instructions       General Comments      Pertinent Vitals/ Pain       Pain Assessment Pain Assessment: Faces Faces Pain Scale: Hurts little more Pain Location: pt.  said "everywhere" and was unable to specify pain location Pain Intervention(s): Limited activity within patient's tolerance  Home Living                                          Prior Functioning/Environment              Frequency  Min 2X/week        Progress Toward Goals  OT Goals(current goals can now be found in the care plan section)  Progress towards OT goals: Progressing toward goals     Plan Discharge plan remains appropriate    Co-evaluation                 AM-PAC OT "6 Clicks" Daily Activity     Outcome Measure   Help from another person eating meals?: A Little Help from another person taking care of personal grooming?: A Lot Help from another person toileting, which includes using toliet, bedpan, or urinal?: Total Help from another person bathing (including washing, rinsing, drying)?: Total Help from another person  to put on and taking off regular upper body clothing?: A Lot Help from another person to put on and taking off regular lower body clothing?: Total 6 Click Score: 10    End of Session Equipment Utilized During Treatment: Oxygen  OT Visit Diagnosis: Other abnormalities of gait and mobility (R26.89);Muscle weakness (generalized) (M62.81);Adult, failure to thrive (R62.7);Pain Pain - Right/Left: Left Pain - part of body: Shoulder   Activity Tolerance Other (comment)   Patient Left in bed;with call bell/phone within reach   Nurse Communication          Time: 4627-0350 OT Time Calculation (min): 8 min  Charges: OT General Charges $OT Visit: 1 Visit OT Treatments $Therapeutic Exercise: 8-22 mins  Sonia Baller, COTA/L Acute Rehabilitation [phone]   Tanya Nones 08/23/2021, 11:39 AM

## 2021-08-23 NOTE — Progress Notes (Signed)
Referring Physician(s): Dr Johnette Abraham [place]  Supervising Physician: Daryll Brod  Patient Status:  [facility] - In-pt  Chief Complaint:  Dysphagia   Subjective:  Percutaneous gastric tube placed in IR 08/20/21 G tube in use No sign of infection  Allergies: Apremilast and Latex  Medications: Prior to Admission medications   Medication Sig Start Date End Date Taking? Authorizing Provider  acetaminophen (TYLENOL) 650 MG CR tablet Take 1 tablet (650 mg total) by mouth every 8 (eight) hours as needed for pain. 07/25/21  Yes Patrecia Pour, MD  amLODipine (NORVASC) 2.5 MG tablet Take 1 tablet (2.5 mg total) by mouth daily. 06/20/21  Yes Medina-Vargas, Monina C, NP  aspirin (ASPIRIN CHILDRENS) 81 MG chewable tablet Place 1 tablet (81 mg total) into feeding tube daily. 08/23/21 09/22/21 Yes [place] Donnamarie Poag, DO  aspirin EC 81 MG EC tablet Take 1 tablet (81 mg total) by mouth daily. 06/25/15  Yes Lars Pinks M, [name]  atorvastatin (LIPITOR) 80 MG tablet TAKE 1 TABLET EVERY DAY  AT  6PM 06/20/21  Yes Medina-Vargas, Monina C, NP  B Complex-C (B-COMPLEX WITH VITAMIN C) tablet Place 1 tablet into feeding tube daily. 05/10/21  Yes Ghimire, Henreitta Leber, MD  cholecalciferol (VITAMIN D) 25 MCG (1000 UNIT) tablet Take 1,000 Units by mouth in the morning and at bedtime.   Yes [provider]  escitalopram (LEXAPRO) 10 MG tablet Take 1 tablet (10 mg total) by mouth at bedtime. 06/20/21  Yes Medina-Vargas, Monina C, NP  Evolocumab with Infusor (Learned) 420 MG/3.5ML SOCT Inject 420 mg into the skin every 30 (thirty) days. 04/26/20  Yes Kroeger, Lorelee Cover., [name]  feeding supplement (ENSURE ENLIVE / ENSURE PLUS) LIQD Take 237 mLs by mouth 3 (three) times daily between meals. 07/29/21  Yes Patrecia Pour, MD  folic acid (FOLVITE) 1 MG tablet Take 1 tablet (1 mg total) by mouth daily. 07/26/21  Yes Patrecia Pour, MD  gabapentin (NEURONTIN) 100 MG capsule Take 1 capsule (100 mg total) by mouth 2  (two) times daily. 06/20/21  Yes Medina-Vargas, Monina C, NP  guaiFENesin (MUCINEX) 600 MG 12 hr tablet Take 1 tablet (600 mg total) by mouth 2 (two) times daily as needed (congestion.). 06/20/21  Yes Medina-Vargas, Monina C, NP  metFORMIN (GLUCOPHAGE) 500 MG tablet Take 1 tablet (500 mg total) by mouth 2 (two) times daily. 06/20/21  Yes Medina-Vargas, Monina C, NP  metoprolol tartrate (LOPRESSOR) 25 MG tablet Take 25 mg by mouth 2 (two) times daily.   Yes [provider]  nitroGLYCERIN (NITROSTAT) 0.4 MG SL tablet DISSOLVE 1 TABLET UNDER THE TONGUE EVERY 5 MINUTES AS  NEEDED FOR CHEST PAIN. MAX  OF 3 TABLETS IN 15 MINUTES. CALL 911 IF PAIN PERSISTS. 06/20/21  Yes Medina-Vargas, Monina C, NP  ondansetron (ZOFRAN-ODT) 4 MG disintegrating tablet Take 1 tablet (4 mg total) by mouth every 8 (eight) hours as needed. Patient taking differently: Take 4 mg by mouth every 8 (eight) hours as needed for nausea or vomiting. 06/20/21  Yes Medina-Vargas, Monina C, NP  pantoprazole (PROTONIX) 40 MG tablet Take 1 tablet (40 mg total) by mouth daily. 06/20/21  Yes Medina-Vargas, Monina C, NP  polyethylene glycol (MIRALAX / GLYCOLAX) 17 g packet Take 17 g by mouth daily.   Yes [provider]  thiamine (VITAMIN B-1) 100 MG tablet Take 250 mg by mouth daily.   Yes [provider]  acetaminophen (TYLENOL) 650 MG suppository Place 1 suppository (  650 mg total) rectally every 6 (six) hours as needed for fever or mild pain. 08/23/21   [place] Donnamarie Poag, DO  amoxicillin-clavulanate (AUGMENTIN) 875-125 MG tablet Place 1 tablet into feeding tube every 12 (twelve) hours for 2 days. 08/23/21 08/25/21  [place] Donnamarie Poag, DO  atorvastatin (LIPITOR) 80 MG tablet Place 1 tablet (80 mg total) into feeding tube daily. 08/23/21   [place] Donnamarie Poag, DO  bisacodyl (DULCOLAX) 10 MG suppository Place 1 suppository (10 mg total) rectally daily as needed for moderate constipation. 08/23/21   [place] Eric J, DO  escitalopram (LEXAPRO) 10 MG  tablet Place 1 tablet (10 mg total) into feeding tube at bedtime. 08/23/21   [place] Donnamarie Poag, DO  folic acid (FOLVITE) 1 MG tablet Place 1 tablet (1 mg total) into feeding tube daily. 08/24/21   [place] Eric J, DO  gabapentin (NEURONTIN) 250 MG/5ML solution Place 2 mLs (100 mg total) into feeding tube every 12 (twelve) hours. 08/23/21   [place] Donnamarie Poag, DO  guaiFENesin (ROBITUSSIN) 100 MG/5ML liquid Place 15 mLs into feeding tube every 6 (six) hours as needed for cough or to loosen phlegm. 08/23/21   [place] Donnamarie Poag, DO  metoprolol tartrate (LOPRESSOR) 25 mg/10 mL SUSP Place 10 mLs (25 mg total) into feeding tube 2 (two) times daily. 08/23/21   [place] Donnamarie Poag, DO  Nutritional Supplements (FEEDING SUPPLEMENT, JEVITY 1.5 CAL/FIBER,) LIQD Place 237 mLs into feeding tube 5 (five) times daily. 08/23/21   [place] Donnamarie Poag, DO  Nutritional Supplements (FEEDING SUPPLEMENT, PROSOURCE TF,) liquid Place 45 mLs into feeding tube 2 (two) times daily. 08/23/21   [place] Eric J, DO  pantoprazole sodium (PROTONIX) 40 mg Place 40 mg into feeding tube daily. 08/23/21   [place] Donnamarie Poag, DO  phosphorus (K PHOS NEUTRAL) [phone] MG tablet Place 1 tablet (250 mg total) into feeding tube daily. 08/24/21   [place] Donnamarie Poag, DO  Water For Irrigation, Sterile (FREE WATER) SOLN Place 100 mLs into feeding tube every 6 (six) hours. 08/23/21   [place] Donnamarie Poag, DO  Water For Irrigation, Sterile (FREE WATER) SOLN Place 120 mLs into feeding tube 5 (five) times daily. 08/23/21   [place] Donnamarie Poag, DO     Vital Signs: BP 123/75 (BP Location: Left Arm)   Pulse (!) 117   Temp (!) 100.4 F (38 C) (Axillary)   Resp 14   Wt 171 lb (77.6 kg)   SpO2 96%   BMI 27.60 kg/m   Physical Exam Abdominal:     General: Bowel sounds are normal.  Skin:    General: Skin is warm.     Comments: No redness NT No sign of infection No bleeding     Imaging: IR GASTROSTOMY TUBE MOD SED  Result Date: 08/20/2021 INDICATION: 61 year old female with history of  severe esophageal dysmotility and failure to thrive presenting for percutaneous gastrostomy tube placement. EXAM: PERC PLACEMENT GASTROSTOMY MEDICATIONS: Ancef 2 gm IV; Antibiotics were administered within 1 hour of the procedure. ANESTHESIA/SEDATION: Versed 1 mg IV; Fentanyl 25 mcg IV Moderate Sedation Time:  15 The patient was continuously monitored during the procedure by the interventional radiology nurse under my direct supervision. CONTRAST:  36m OMNIPAQUE IOHEXOL 300 MG/ML SOLN - administered into the gastric lumen. FLUOROSCOPY TIME:  Fluoroscopy Time: 0 minutes 18 seconds (1 mGy). COMPLICATIONS: None immediate. PROCEDURE: Informed written consent was obtained from the patient after a thorough discussion of the procedural risks, benefits and alternatives. All questions were addressed. Maximal Sterile barrier Technique was  utilized including caps, mask, sterile gowns, sterile gloves, sterile drape, hand hygiene and skin antiseptic. A timeout was performed prior to the initiation of the procedure. The patient was placed on the procedure table in the supine position. Pre-procedure abdominal film confirmed visualization of the transverse colon. The patient was prepped and draped in usual sterile fashion. The stomach was insufflated with air via the indwelling nasogastric tube. Under fluoroscopy, a puncture site was selected and local analgesia achieved with 1% lidocaine infiltrated subcutaneously. Under fluoroscopic guidance, a gastropexy needle was passed into the stomach and the T-bar suture was released. Entry into the stomach was confirmed with fluoroscopy, aspiration of air, and injection of contrast material. This was repeated with an additional gastropexy suture (for a total of 2 fasteners). At the center of these gastropexy sutures, a dermatotomy was performed. An 18 gauge needle was passed into the stomach at the site of this dermatotomy, and position within the gastric lumen again confirmed under  fluoroscopy using aspiration of air and contrast injection. An Amplatz guidewire was passed through this needle and intraluminal placement within the stomach was confirmed by fluoroscopy. The needle was removed. Over the guidewire, the percutaneous tract was dilated using a 10 mm non-compliant balloon. The balloon was deflated, then pushed into the gastric lumen followed in concert by the 20 Fr gastrostomy tube. The retention balloon of the percutaneous gastrostomy tube was inflated with 20 mL of sterile water. The tube was withdrawn until the retention balloon was at the edge of the gastric lumen. The external bumper was brought to the abdominal wall. Contrast was injected through the gastrostomy tube, confirming intraluminal positioning. The patient tolerated the procedure well without any immediate post-procedural complications. IMPRESSION: Technically successful placement of 20 Fr gastrostomy tube. Ruthann Cancer, MD [facility] Radiology Electronically Signed   By: Ruthann Cancer M.D.   On: 08/20/2021 17:41   DG Chest Port 1 View  Result Date: 08/20/2021 CLINICAL DATA:  Shortness of breath. EXAM: PORTABLE CHEST 1 VIEW COMPARISON:  Multiple recent chest x-rays. FINDINGS: The cardiac silhouette, mediastinal hilar contours are within normal limits and stable. Persistent interstitial and airspace process mainly in the right lung. No significant interval change. No new pulmonary findings. The feeding tube tip remains in the fundal region of the stomach. IMPRESSION: Persistent interstitial and airspace process. Electronically Signed   By: Marijo Sanes M.D.   On: 08/20/2021 08:36    Labs:  CBC: Recent Labs    08/09/21 0638 08/10/21 0236 08/18/21 0901 08/19/21 0101  WBC 5.6 6.0 6.6 9.7  HGB 11.6* 10.0* 8.9* 9.8*  HCT 36.4 30.9* 28.0* 30.2*  PLT 124* 120* 374 385    COAGS: Recent Labs    04/30/21 1834 07/18/21 1904 08/05/21 1107 08/20/21 0249  INR  1.2 1.1 1.1 1.1  APTT 29  --  45*  --     BMP: Recent Labs    08/18/21 0221 08/19/21 0238 08/20/21 0249 08/21/21 0402  NA 134* 138 138 139  K 4.2 4.3 3.7 3.8  CL 103 100 98 100  CO2 '27 28 31 '$ 32  GLUCOSE 174* 104* 117* 79  BUN '11 12 16 12  '$ CALCIUM 8.6* 8.9 9.0 9.2  CREATININE 0.48 0.53 0.52 0.49  GFRNONAA >60 >60 >60 >60    LIVER FUNCTION TESTS: Recent Labs    07/18/21 1904 07/19/21 0500 08/05/21 1107 08/07/21 0409 08/08/21 0416 08/18/21 0221 08/19/21 0238 08/20/21 0249 08/21/21 0402  BILITOT 0.8 0.7 1.2 0.3  --   --   --   --   --  AST 68* 63* 75* 50*  --   --   --   --   --   ALT '18 16 21 17  '$ --   --   --   --   --   ALKPHOS 79 76 223* 169*  --   --   --   --   --   PROT 8.1 7.2 7.1 6.6  --   --   --   --   --   ALBUMIN 2.3* 2.1* 1.5* <1.5*   < > 2.2* 2.1* 2.1* 2.2*   < > = values in this interval not displayed.    Assessment and Plan:  Percutaneous gastric tube  Placed in IR 08/20/21 Doing well Low grade temp today Sutures will dissolve on own Continue use--- call if any needs   Electronically Signed: Lavonia Drafts, [name] 08/23/2021, 1:11 PM   I spent a total of 15 Minutes at the the patient's bedside AND on the patient's hospital floor or unit, greater than 50% of which was counseling/coordinating care for percutaneous G tube

## 2021-08-23 NOTE — Discharge Summary (Signed)
Physician Discharge Summary  PEACE NOYES JEH:[id number] DOB: 12-07-1960 DOA: 08/05/2021  PCP: Leeroy Cha, MD  Admit date: 08/05/2021 Discharge date: 08/23/2021  Admitted From: Home Disposition: [facility]  Recommendations for Outpatient Follow-up:  Follow up with PCP in 1-2 weeks Would recommend further goals of care discussion with patient's family outpatient    Discharge Condition: Stable CODE STATUS: Full code  Diet recommendation:  Bolus tube feeding via G-tube: Jevity 1.5 237 ml (1 carton) 5 times per day. Continue Prosource TF 45 ml BID. Free water 60 ml before and after each bolus feeding.   Provides 1855 kcal, 98 gm protein, 900 ml free water daily.   Additional free water flushes 100 ml QID for a total of 1900 ml free water daily.  History of present illness:  [name] [name] is a 61 y.o. female with past medical history significant for dysphagia secondary to moderate esophageal dysmotility (barium swallow 05/2021), psoriatic arthritis, CAD s/p CABG, DM2, chronic systolic congestive heart failure (LVEF 45%), adult failure to thrive who presented to [facility] ED from [facility] on 5/22 for confusion, hypoxia and fever.   Recently hospitalized and discharged to SNF 11 days PTA after treatment for supposed empyema and pneumonia.  Also patient has lost roughly 13 kg over the last 5 months.  Patient was discharged on a pured diet despite family observing several times she had choking episodes after eating food.   In the ED, temperature 101.4 F, BP 101/59, HR 87, RR 27, SPO2 92% on 15 L nonrebreather.  Sodium 149, potassium 5.2, chloride 117, CO2 23, glucose 129, BUN 31, creatinine 1.54.  Alkaline phosphatase 223, AST 75, ALT 21, total bilirubin 1.2.  Lactic acid 3.1, WBC 12.2, hemoglobin 13.7, platelets 179.  INR 1.1.  COVID-19 PCR negative.  Influenza A/B PCR negative.  Urinalysis with moderate leukocytes, negative nitrite, many bacteria, 21-50 WBCs.  CT  angiogram chest with no evidence of pulmonary embolism, no evidence of thoracic aortic dissection, noted ectasia ascending thoracic aorta measuring 3.7 cm, CAD, patchy infiltrates both lower lung fields, greatest on the right suggesting pneumonia.  Patient was started on vancomycin and cefepime.  Hospital service consulted for further evaluation and management of acute hypoxic restaurant failure secondary to pneumonia.  Hospital course:  Acute hypoxic respiratory failure Aspiration pneumonia Patient presenting to ED from SNF with fever, shortness of breath and found to be hypoxic, tachypneic requiring 15 L nonrebreather on arrival.  Influenza A/B and COVID-19 PCR negative.  Lactic acid elevated 2.5.  Imaging notable for patchy infiltrates bilateral lower lung fields, greatest on right concerning for aspiration pneumonia in the setting of her known esophageal dysmotility; no pulmonary embolism.  Patient was initially started on empiric antibiotics with vancomycin and cefepime and changed to meropenem due to her underlying ESBL E. coli UTI as below.  Patient was able to be titrated off of supplemental oxygen.  Repeat blood culture 6/4 showed no growth.  Completed course of meropenem followed by continues on Augmentin to complete 7-day course thereafter.  Continue aspiration precautions, make sure she is sitting upright greater than 30 degrees in the bed.   ESBL E. coli UTI/bacteremia Urinalysis with moderate leukocytes, negative nitrite, many bacteria, 21-50 WBCs. Urine culture and blood cultures x2 5/22 positive for ESBL E. coli.  Completed course with meropenem.  Repeat blood cultures 6/4 showed no growth.   Type 2 diabetes mellitus On metformin 500 mg p.o. twice daily at baseline.  Hemoglobin A1c 6.7, well controlled.  Discontinued metformin  is now on tube feeds and glucose has been adequately controlled during hospitalization.   CAD Continue statin   HLD: Atorvastatin 80 mg per tube daily    Depression/anxiety: Lexapro 10 mg per tube daily   GERD: Protonix 40 mg per tube daily   Chronic systolic congestive heart failure, compensated Essential hypertension On amlodipine 2.5 mg p.o. daily, metoprolol tartrate 25 mg PO BID at home.  Amlodipine discontinued, continue metoprolol tartrate 25 mg per tube twice daily.   Acute renal failure Etiology likely secondary to poor oral intake in the setting of dehydration and adult failure to thrive.  Creatinine 1.54 on admission.  Patient was started IV fluid hydration and initially a core track was placed to initiate tube feeds.  Core track has now been transitioned to percutaneous G-tube placement.  Creatinine now improved to 0.49.   Esophageal dysmotility Dysphagia Adult failure to thrive Moderate protein calorie malnutrition Patient is followed by [facility] gastroenterology outpatient.  Has had EGD 08/2020 showing normal-appearing esophagus.  Barium swallow March 2023 with no evidence of stricture and notable moderate esophageal dysmotility.  Seen by gastroenterology once again during his hospitalization with no recommendations of repeating EGD at this time as this will not decrease risk of aspiration pneumonia and will not change her desire to eat; normal will also not improve her esophageal dysmotility.  Interventional radiology was consulted and patient underwent percutaneous G-tube placement on 08/20/2021.  Continue tube feeds.   Bolus tube feeding via G-tube: Jevity 1.5 237 ml (1 carton) 5 times per day. Continue Prosource TF 45 ml BID. Free water 60 ml before and after each bolus feeding.   Provides 1855 kcal, 98 gm protein, 900 ml free water daily.   Additional free water flushes 100 ml QID for a total of 1900 ml free water daily.   Nutrition Status: Nutrition Problem: Moderate Malnutrition Etiology: chronic illness (dysphagia) Signs/Symptoms: mild muscle depletion, moderate muscle depletion, mild fat depletion, moderate fat  depletion Interventions: Refer to RD note for recommendations     Pressure injury, POA Noted right buttock stage II and right hip stage II pressure injury.  Seen by wound care.  Continue local wound care and offloading.  Discharge Diagnoses:  Principal Problem:   Sepsis ([place]) Active Problems:   Rheumatoid arthritis ([place])   Gastroesophageal reflux disease without esophagitis   AMS (altered mental status)   Hypokalemia   Hypernatremia   Pressure injury of skin   Malnutrition of moderate degree   AKI (acute kidney injury) ([place])   Adult failure to thrive syndrome   Dysphagia   Chronic systolic CHF (congestive heart failure) (HCC)   Infection due to ESBL-producing Escherichia coli   Hypophosphatemia   Folate deficiency    Discharge Instructions  Discharge Instructions     Diet - low sodium heart healthy   Complete by: As directed    Discharge wound care:   Complete by: As directed    Foam dressing to right buttock and right hip, change Q 3 days or PRN soiling   Increase activity slowly   Complete by: As directed       Allergies as of 08/23/2021       Reactions   Apremilast Other (See Comments)   unknown   Latex Itching, Swelling, Rash        Medication List     STOP taking these medications    acetaminophen 650 MG CR tablet Commonly known as: TYLENOL Replaced by: acetaminophen 650 MG suppository   amLODipine 2.5  MG tablet Commonly known as: NORVASC   aspirin EC 81 MG tablet Replaced by: aspirin 81 MG chewable tablet   B-complex with vitamin C tablet   cholecalciferol 25 MCG (1000 UNIT) tablet Commonly known as: VITAMIN D   fluconazole 200 MG tablet Commonly known as: DIFLUCAN   gabapentin 100 MG capsule Commonly known as: NEURONTIN Replaced by: gabapentin 250 MG/5ML solution   guaiFENesin 600 MG 12 hr tablet Commonly known as: MUCINEX Replaced by: guaiFENesin 100 MG/5ML liquid   metFORMIN 500 MG tablet Commonly known as: GLUCOPHAGE    metoprolol tartrate 25 MG tablet Commonly known as: LOPRESSOR Replaced by: metoprolol tartrate 25 mg/10 mL Susp   pantoprazole 40 MG tablet Commonly known as: PROTONIX Replaced by: pantoprazole sodium 40 mg   polyethylene glycol 17 g packet Commonly known as: MIRALAX / GLYCOLAX   thiamine 100 MG tablet Commonly known as: Vitamin B-1       TAKE these medications    acetaminophen 650 MG suppository Commonly known as: TYLENOL Place 1 suppository (650 mg total) rectally every 6 (six) hours as needed for fever or mild pain. Replaces: acetaminophen 650 MG CR tablet   amoxicillin-clavulanate 875-125 MG tablet Commonly known as: AUGMENTIN Place 1 tablet into feeding tube every 12 (twelve) hours for 2 days.   aspirin 81 MG chewable tablet Commonly known as: Aspirin Children[facility] Place 1 tablet (81 mg total) into feeding tube daily. Replaces: aspirin EC 81 MG tablet   atorvastatin 80 MG tablet Commonly known as: LIPITOR Place 1 tablet (80 mg total) into feeding tube daily. What changed:  how much to take how to take this when to take this additional instructions   bisacodyl 10 MG suppository Commonly known as: DULCOLAX Place 1 suppository (10 mg total) rectally daily as needed for moderate constipation.   escitalopram 10 MG tablet Commonly known as: LEXAPRO Place 1 tablet (10 mg total) into feeding tube at bedtime. What changed: how to take this   feeding supplement (PROSource TF) liquid Place 45 mLs into feeding tube 2 (two) times daily. What changed:  how much to take how to take this when to take this   feeding supplement (JEVITY 1.5 CAL/FIBER) Liqd Place 237 mLs into feeding tube 5 (five) times daily. What changed: You were already taking a medication with the same name, and this prescription was added. Make sure you understand how and when to take each.   folic acid 1 MG tablet Commonly known as: FOLVITE Place 1 tablet (1 mg total) into feeding tube  daily. Start taking on: August 24, 2021 What changed: how to take this   free water Soln Place 100 mLs into feeding tube every 6 (six) hours.   free water Soln Place 120 mLs into feeding tube 5 (five) times daily.   gabapentin 250 MG/5ML solution Commonly known as: NEURONTIN Place 2 mLs (100 mg total) into feeding tube every 12 (twelve) hours. Replaces: gabapentin 100 MG capsule   guaiFENesin 100 MG/5ML liquid Commonly known as: ROBITUSSIN Place 15 mLs into feeding tube every 6 (six) hours as needed for cough or to loosen phlegm. Replaces: guaiFENesin 600 MG 12 hr tablet   metoprolol tartrate 25 mg/10 mL Susp Commonly known as: LOPRESSOR Place 10 mLs (25 mg total) into feeding tube 2 (two) times daily. Replaces: metoprolol tartrate 25 MG tablet   nitroGLYCERIN 0.4 MG SL tablet Commonly known as: NITROSTAT DISSOLVE 1 TABLET UNDER THE TONGUE EVERY 5 MINUTES AS  NEEDED FOR CHEST PAIN. MAX  OF  3 TABLETS IN 15 MINUTES. CALL 911 IF PAIN PERSISTS.   ondansetron 4 MG disintegrating tablet Commonly known as: ZOFRAN-ODT Take 1 tablet (4 mg total) by mouth every 8 (eight) hours as needed. What changed: reasons to take this   pantoprazole sodium 40 mg Commonly known as: PROTONIX Place 40 mg into feeding tube daily. Replaces: pantoprazole 40 MG tablet   phosphorus 155-852-130 MG tablet Commonly known as: K PHOS NEUTRAL Place 1 tablet (250 mg total) into feeding tube daily. Start taking on: August 24, 2021   [place] Pushtronex System 420 MG/3.5ML Soct Generic drug: Evolocumab with Infusor Inject 420 mg into the skin every 30 (thirty) days.               Discharge Care Instructions  (From admission, onward)           Start     Ordered   08/23/21 0000  Discharge wound care:       Comments: Foam dressing to right buttock and right hip, change Q 3 days or PRN soiling   08/23/21 1019            Follow-up Information     Leeroy Cha, MD. Schedule an  appointment as soon as possible for a visit in 1 week(s).   Specialty: Internal Medicine Contact information: [address] [place] 10/07/2021 [phone]         [place], Peter M, MD .   Specialty: Cardiology Contact information: [address] [place] [address] [phone]                Allergies  Allergen Reactions   Apremilast Other (See Comments)    unknown   Latex Itching, Swelling and Rash    Consultations: Infectious disease, Dr. Juleen China - signed off 5/25 Palliative care Gastroenterology, Dr. Alessandra Bevels - signed off 5/31 Interventional radiology   Procedures/Studies: IR GASTROSTOMY TUBE MOD SED  Result Date: 08/20/2021 INDICATION: 61 year old female with history of severe esophageal dysmotility and failure to thrive presenting for percutaneous gastrostomy tube placement. EXAM: PERC PLACEMENT GASTROSTOMY MEDICATIONS: Ancef 2 gm IV; Antibiotics were administered within 1 hour of the procedure. ANESTHESIA/SEDATION: Versed 1 mg IV; Fentanyl 25 mcg IV Moderate Sedation Time:  15 The patient was continuously monitored during the procedure by the interventional radiology nurse under my direct supervision. CONTRAST:  33m OMNIPAQUE IOHEXOL 300 MG/ML SOLN - administered into the gastric lumen. FLUOROSCOPY TIME:  Fluoroscopy Time: 0 minutes 18 seconds (1 mGy). COMPLICATIONS: None immediate. PROCEDURE: Informed written consent was obtained from the patient after a thorough discussion of the procedural risks, benefits and alternatives. All questions were addressed. Maximal Sterile barrier Technique was utilized including caps, mask, sterile gowns, sterile gloves, sterile drape, hand hygiene and skin antiseptic. A timeout was performed prior to the initiation of the procedure. The patient was placed on the procedure table in the supine position. Pre-procedure abdominal film confirmed visualization of the transverse colon. The patient was prepped  and draped in usual sterile fashion. The stomach was insufflated with air via the indwelling nasogastric tube. Under fluoroscopy, a puncture site was selected and local analgesia achieved with 1% lidocaine infiltrated subcutaneously. Under fluoroscopic guidance, a gastropexy needle was passed into the stomach and the T-bar suture was released. Entry into the stomach was confirmed with fluoroscopy, aspiration of air, and injection of contrast material. This was repeated with an additional gastropexy suture (for a total of 2 fasteners). At the center of these gastropexy sutures, a  dermatotomy was performed. An 18 gauge needle was passed into the stomach at the site of this dermatotomy, and position within the gastric lumen again confirmed under fluoroscopy using aspiration of air and contrast injection. An Amplatz guidewire was passed through this needle and intraluminal placement within the stomach was confirmed by fluoroscopy. The needle was removed. Over the guidewire, the percutaneous tract was dilated using a 10 mm non-compliant balloon. The balloon was deflated, then pushed into the gastric lumen followed in concert by the 20 Fr gastrostomy tube. The retention balloon of the percutaneous gastrostomy tube was inflated with 20 mL of sterile water. The tube was withdrawn until the retention balloon was at the edge of the gastric lumen. The external bumper was brought to the abdominal wall. Contrast was injected through the gastrostomy tube, confirming intraluminal positioning. The patient tolerated the procedure well without any immediate post-procedural complications. IMPRESSION: Technically successful placement of 20 Fr gastrostomy tube. Ruthann Cancer, MD [facility] Radiology Electronically Signed   By: Ruthann Cancer M.D.   On: 08/20/2021 17:41   DG Chest Port 1 View  Result Date: 08/20/2021 CLINICAL DATA:  Shortness of breath. EXAM: PORTABLE CHEST 1 VIEW  COMPARISON:  Multiple recent chest x-rays. FINDINGS: The cardiac silhouette, mediastinal hilar contours are within normal limits and stable. Persistent interstitial and airspace process mainly in the right lung. No significant interval change. No new pulmonary findings. The feeding tube tip remains in the fundal region of the stomach. IMPRESSION: Persistent interstitial and airspace process. Electronically Signed   By: Marijo Sanes M.D.   On: 08/20/2021 08:36   DG Chest Port 1 View  Result Date: 08/19/2021 CLINICAL DATA:  Altered mental status EXAM: PORTABLE CHEST 1 VIEW COMPARISON:  08/18/2021 FINDINGS: Bilateral interstitial and patchy alveolar airspace opacities, right greater than left. Small right pleural effusion. Possible trace left pleural effusion. No pneumothorax. No pleural effusion or pneumothorax. Mild stable cardiomegaly. Prior CABG. Nasogastric tube with the tip projecting over the stomach. No acute osseous abnormality. IMPRESSION: 1. Bilateral interstitial and patchy alveolar airspace opacities, right greater than left. Differential considerations include pulmonary edema versus multilobar pneumonia. Electronically Signed   By: Kathreen Devoid M.D.   On: 08/19/2021 08:48   DG Chest Port 1 View  Result Date: 08/18/2021 CLINICAL DATA:  Fever, shortness of breath EXAM: PORTABLE CHEST 1 VIEW COMPARISON:  Previous studies including the examination of 08/17/2021 FINDINGS: Transverse diameter of heart is increased. There is evidence of previous coronary bypass surgery. There is interval improvement in aeration in the right parahilar region and right lower lung fields. Residual patchy alveolar densities seen in the right mid and right lower lung fields. Small linear densities seen in the left lower lung fields. There is blunting of right lateral CP angle. There is no pneumothorax. Tip of feeding tube is noted in the medial aspect of fundus of the stomach close to the gastroesophageal junction. Feeding  tube should be advanced 5-10 cm to place the tip well within the stomach. IMPRESSION: Cardiomegaly. There is interval decrease in alveolar densities in the right parahilar region and right lower lung fields suggesting resolving pneumonia or resolving asymmetric pulmonary edema. Small right pleural effusion is seen. Small linear densities in the left lower lung fields may suggest scarring or subsegmental atelectasis. Tip of feeding tube is noted in the medial aspect of fundus of the stomach close to the gastroesophageal junction. Feeding tube should be advanced 5-10 cm to place the tip well within the  stomach. Electronically Signed   By: Elmer Picker M.D.   On: 08/18/2021 17:33   DG Chest Port 1 View  Result Date: 08/17/2021 CLINICAL DATA:  Congestive heart failure.  Coronary artery disease. EXAM: PORTABLE CHEST 1 VIEW COMPARISON:  08/06/2021 FINDINGS: Stable cardiomegaly. Prior CABG. New feeding tube is seen with tip overlying the proximal stomach. Diffuse asymmetric right lung airspace disease is increased since previous study. Stable small right pleural effusion. Mild atelectasis or scarring in retrocardiac lung base is also unchanged. IMPRESSION: Increased diffuse asymmetric right lung airspace disease, which may be due to infection or asymmetric edema. Stable small right pleural effusion. New feeding tube, with tip overlying the proximal stomach. Electronically Signed   By: Marlaine Hind M.D.   On: 08/17/2021 09:33   CT ABDOMEN WO CONTRAST  Result Date: 08/14/2021 CLINICAL DATA:  Altered mental status, sepsis, dysphagia and evaluation for possible gastrostomy tube placement for nutrition. EXAM: CT ABDOMEN WITHOUT CONTRAST TECHNIQUE: Multidetector CT imaging of the abdomen was performed following the standard protocol without IV contrast. RADIATION DOSE REDUCTION: This exam was performed according to the departmental dose-optimization program which includes automated exposure control, adjustment of  the mA and/or kV according to patient size and/or use of iterative reconstruction technique. COMPARISON:  CT a of the chest on 08/05/2021 FINDINGS: Lower chest: Stable small right pleural effusion with associated right basilar atelectasis. Mild atelectasis at the posterior left lung base. Hepatobiliary: Unenhanced appearance of the liver is unremarkable. The gallbladder is contracted. Pancreas: Unremarkable. No pancreatic ductal dilatation or surrounding inflammatory changes. Spleen: Normal in size without focal abnormality. Adrenals/Urinary Tract: Adrenal glands are unremarkable. Kidneys are normal, without renal calculi, focal lesion, or hydronephrosis. Stomach/Bowel: The tube extends into the proximal stomach just below the GE junction. Bowel shows no evidence of obstruction, ileus or inflammation. No free air identified. No hiatal hernia. The stomach is normally positioned with predominant horizontal lie. The transverse colon is well below the stomach and there is no interposition of bowel between the stomach and abdominal wall. Left lobe of the liver does cross the proximal stomach. Vascular/Lymphatic: Aortic atherosclerosis without aneurysm. No enlarged abdominal lymph nodes. Other: No abdominal wall hernia. No ascites or abnormal fluid collections visualized. Musculoskeletal: No acute or significant osseous findings. IMPRESSION: 1. Normal positioning of stomach with no anatomic contraindication to potential attempted percutaneous gastrostomy tube placement. 2. Current feeding tube extends barely below the GE junction into the proximal stomach. 3. Stable small right pleural effusion with associated right basilar atelectasis. 4. Atherosclerosis of the abdominal aorta without aneurysm. Electronically Signed   By: Aletta Edouard M.D.   On: 08/14/2021 11:16   DG Abd Portable 1V  Result Date: 08/07/2021 CLINICAL DATA:  Small bore feeding tube placement. EXAM: PORTABLE ABDOMEN - 1 VIEW COMPARISON:  05/06/2021  FINDINGS: A small bore feeding tube is noted with tip overlying the distal stomach. Visualized bowel gas pattern is unremarkable. Cardiomegaly, CABG change and small RIGHT pleural effusion again noted. IMPRESSION: Small bore feeding tube with tip overlying the distal stomach. Electronically Signed   By: Margarette Canada M.D.   On: 08/07/2021 15:15   DG CHEST PORT 1 VIEW  Result Date: 08/06/2021 CLINICAL DATA:  Pneumonia EXAM: PORTABLE CHEST 1 VIEW COMPARISON:  Chest x-ray dated Aug 05, 2021 FINDINGS: Cardiac and mediastinal contours are unchanged post median sternotomy and CABG. Small right pleural effusion and bibasilar opacities which are likely due to atelectasis. No evidence of pneumothorax. IMPRESSION: Small right pleural effusion and bibasilar opacities  which are likely due to atelectasis. Electronically Signed   By: Yetta Glassman M.D.   On: 08/06/2021 08:18   CT Angio Chest PE W and/or Wo Contrast  Result Date: 08/05/2021 CLINICAL DATA:  High clinical suspicion for pulmonary embolism EXAM: CT ANGIOGRAPHY CHEST WITH CONTRAST TECHNIQUE: Multidetector CT imaging of the chest was performed using the standard protocol during bolus administration of intravenous contrast. Multiplanar CT image reconstructions and MIPs were obtained to evaluate the vascular anatomy. RADIATION DOSE REDUCTION: This exam was performed according to the departmental dose-optimization program which includes automated exposure control, adjustment of the mA and/or kV according to patient size and/or use of iterative reconstruction technique. CONTRAST:  147m OMNIPAQUE IOHEXOL 350 MG/ML SOLN COMPARISON:  Previous studies including the chest radiographs done on 08/05/2021 and CT chest done on 07/18/2021 FINDINGS: Cardiovascular: There is homogeneous enhancement in the thoracic aorta. There is ectasia of ascending thoracic aorta measuring 3.7 cm. Scattered coronary artery calcifications are seen. Metallic sutures are seen in the sternum  suggesting possible previous coronary bypass surgery. Heart is enlarged in size. There are no intraluminal filling defects in the central pulmonary artery branches. Evaluation of small peripheral branches in the lower lung fields is limited by infiltrates and less than optimal contrast enhancement. Mediastinum/Nodes: No significant lymphadenopathy seen. Lungs/Pleura: There are patchy infiltrates in the posterior aspect of both lower lung fields more so on the right side. Small right pleural effusion is seen. There is no pneumothorax. Upper Abdomen: Unremarkable. Musculoskeletal: Unremarkable. Review of the MIP images confirms the above findings. IMPRESSION: There is no evidence of pulmonary artery embolism. There is no evidence of thoracic aortic dissection. There is ectasia of ascending thoracic aorta measuring 3.7 cm. Coronary artery disease. There are patchy infiltrates in both lower lung fields, more so on the right side suggesting atelectasis/pneumonia. Small right pleural effusion is seen. Electronically Signed   By: PElmer PickerM.D.   On: 08/05/2021 14:48   DG Chest Port 1 View  Result Date: 08/05/2021 CLINICAL DATA:  Fever.  Possible sepsis. EXAM: PORTABLE CHEST 1 VIEW COMPARISON:  Chest x-ray dated Jul 21, 2021. FINDINGS: Unchanged mild cardiomegaly status post CABG. Normal pulmonary vascularity. Unchanged trace right pleural effusion. No consolidation or pneumothorax. No acute osseous abnormality. IMPRESSION: 1. Unchanged trace right pleural effusion. Electronically Signed   By: WTitus DubinM.D.   On: 08/05/2021 12:07   [place]  Result Date: 07/29/2021 CLINICAL DATA:  Thyromegaly, odynophagia EXAM: THYROID ULTRASOUND TECHNIQUE: Ultrasound examination of the thyroid gland and adjacent soft tissues was performed. Technologist describes technically difficult study secondary to altered mental status, agitation. COMPARISON:  None FINDINGS: Parenchymal Echotexture: Mildly heterogenous  Isthmus: 0.5 cm thickness Right lobe: 6.7 x 2.4 x 2.2 cm Left lobe: 4.7 x 2.1 x 2.1 cm _________________________________________________________ Estimated total number of nodules >/= 1 cm: 3 Number of spongiform nodules >/=  2 cm not described below (TR1): 0 Number of mixed cystic and solid nodules >/= 1.5 cm not described below (TR2): 0 _________________________________________________________ Nodule # 1: 0.6 cm benign colloid cyst, mid right Nodule # 2: Location: Right; inferior Maximum size: 1.1 cm; Other 2 dimensions: 1 x 0.9 cm Composition: mixed cystic and solid (1) Echogenicity: isoechoic (1) Shape: taller-than-wide (3) Margins: ill-defined (0) Echogenic foci: none (0) ACR TI-RADS total points: 5. ACR TI-RADS risk category: TR 4. ACR TI-RADS recommendations: *Given size (>/= 1 - 1.4 cm) and appearance, a follow-up ultrasound in 1 year should be considered based on TI-RADS criteria. _________________________________________________________ Nodule #  3: 0.8 cm complex nodule, superior left; This nodule does NOT meet TI-RADS criteria for biopsy or dedicated follow-up. Nodule # 4: 1.3 cm spongiform nodule, superior left; This nodule does NOT meet TI-RADS criteria for biopsy or dedicated follow-up. Nodule # 5: Location: Left; inferior Maximum size: 1 cm; Other 2 dimensions: 0.6 x 1 cm Composition: solid/almost completely solid (2) Echogenicity: isoechoic (1) Shape: not taller-than-wide (0) Margins: ill-defined (0) Echogenic foci: none (0) ACR TI-RADS total points: 3. ACR TI-RADS risk category: TR 3. ACR TI-RADS recommendations: Given size (<1.4 cm) and appearance, this nodule does NOT meet TI-RADS criteria for biopsy or dedicated follow-up. _________________________________________________________ No regional cervical adenopathy identified. IMPRESSION: 1. Borderline thyromegaly with small nodules. None meets criteria for biopsy. 2. Recommend annual/biennial ultrasound follow-up of inferior right nodule as above,  until stability x5 years confirmed. The above is in keeping with the ACR TI-RADS recommendations - J Am Coll Radiol 2017;14:587-595. Electronically Signed   By: Lucrezia Europe M.D.   On: 07/29/2021 08:37     Subjective: Patient seen examined bedside, resting calmly.  No family present.  No specific complaints or concerns this morning.  Continues to tolerate tube feeds.  Discharging to SNF today.  Denies headache, no chest pain, no shortness of breath, no nausea/vomiting/diarrhea.  No acute events overnight per nursing staff.  Discharge Exam: Vitals:   08/23/21 0412 08/23/21 0835  BP: 118/69 123/75  Pulse: 94 (!) 117  Resp: 14   Temp: 98.4 F (36.9 C) 99.5 F (37.5 C)  SpO2: 95% 96%   Vitals:   08/22/21 2006 08/23/21 0002 08/23/21 0412 08/23/21 0835  BP: 127/72 107/62 118/69 123/75  Pulse:  97 94 (!) 117  Resp: '16 20 14   '$ Temp:  98.6 F (37 C) 98.4 F (36.9 C) 99.5 F (37.5 C)  TempSrc:  Oral Oral Axillary  SpO2:  96% 95% 96%  Weight:        Physical Exam: GEN: NAD, alert, chronically ill appearance, appears older than stated age HEENT: NCAT, PERRL, EOMI, sclera clear, MMM PULM: CTAB w/o wheezes/crackles, normal respiratory effort, on room air with SPO2 95% at rest CV: RRR w/o M/G/R GI: abd soft, NTND, NABS, no R/G/M, noted G-tube in place with surrounding dressing clean/dry/intact MSK: no peripheral edema, moves all extremities independently NEURO: CN II-XII intact, no focal deficits, sensation to light touch intact PSYCH: normal mood/affect Integumentary: Right buttock/hip wound stage II noted, otherwise no other concerning rashes/lesions/wounds    The results of significant diagnostics from this hospitalization (including imaging, microbiology, ancillary and laboratory) are listed below for reference.     Microbiology: Recent Results (from the past 240 hour(s))  Culture, blood (Routine X 2) w Reflex to ID Panel     Status: None (Preliminary result)   Collection Time:  08/18/21  5:00 PM   Specimen: BLOOD  Result Value Ref Range Status   Specimen Description BLOOD LEFT ANTECUBITAL  Final   Special Requests   Final    BOTTLES DRAWN AEROBIC AND ANAEROBIC Blood Culture adequate volume   Culture   Final    NO GROWTH 4 DAYS Performed at [facility], [address] [place], [place] [address]    Report Status PENDING  Incomplete  Culture, blood (Routine X 2) w Reflex to ID Panel     Status: None (Preliminary result)   Collection Time: 08/18/21  5:01 PM   Specimen: BLOOD  Result Value Ref Range Status   Specimen Description BLOOD BLOOD RIGHT HAND  Final   Special Requests   Final    BOTTLES DRAWN AEROBIC AND ANAEROBIC Blood Culture adequate volume   Culture   Final    NO GROWTH 4 DAYS Performed at [facility], [address] [place], [place] [address]    Report Status PENDING  Incomplete     Labs: BNP (last 3 results) Recent Labs    08/17/21 0907  BNP 253.6*   Basic Metabolic Panel: Recent Labs  Lab 08/17/21 0300 08/18/21 0221 08/19/21 0238 08/20/21 0249 08/21/21 0402  NA 138 134* 138 138 139  K 3.9 4.2 4.3 3.7 3.8  CL 102 103 100 98 100  CO2 '29 27 28 31 '$ 32  GLUCOSE 119* 174* 104* 117* 79  BUN '11 11 12 16 12  '$ CREATININE 0.46 0.48 0.53 0.52 0.49  CALCIUM 8.9 8.6* 8.9 9.0 9.2  PHOS 3.5 3.2 3.7 3.5 3.1   Liver Function Tests: Recent Labs  Lab 08/17/21 0300 08/18/21 0221 08/19/21 0238 08/20/21 0249 08/21/21 0402  ALBUMIN 2.2* 2.2* 2.1* 2.1* 2.2*   No results for input(s): "LIPASE", "AMYLASE" in the last 168 hours. No results for input(s): "AMMONIA" in the last 168 hours. CBC: Recent Labs  Lab 08/18/21 0901 08/19/21 0101  WBC 6.6 9.7  HGB 8.9* 9.8*  HCT 28.0* 30.2*  MCV 94.3 94.4  PLT 374 385   Cardiac Enzymes: No results for input(s): "CKTOTAL", "CKMB", "CKMBINDEX", "TROPONINI" in the last 168 hours. BNP: Invalid input(s): "POCBNP" CBG: Recent Labs  Lab 08/22/21 1229 08/22/21 1635 08/22/21 1931  08/23/21 0001 08/23/21 0411  GLUCAP 130* 120* 129* 109* 91   D-Dimer No results for input(s): "DDIMER" in the last 72 hours. Hgb A1c No results for input(s): "HGBA1C" in the last 72 hours. Lipid Profile No results for input(s): "CHOL", "HDL", "LDLCALC", "TRIG", "CHOLHDL", "LDLDIRECT" in the last 72 hours. Thyroid function studies No results for input(s): "TSH", "T4TOTAL", "T3FREE", "THYROIDAB" in the last 72 hours.  Invalid input(s): "FREET3" Anemia work up No results for input(s): "VITAMINB12", "FOLATE", "FERRITIN", "TIBC", "IRON", "RETICCTPCT" in the last 72 hours. Urinalysis    Component Value Date/Time   COLORURINE YELLOW 08/18/2021 1654   APPEARANCEUR CLEAR 08/18/2021 1654   LABSPEC 1.013 08/18/2021 1654   PHURINE 6.0 08/18/2021 1654   GLUCOSEU NEGATIVE 08/18/2021 1654   HGBUR NEGATIVE 08/18/2021 1654   BILIRUBINUR NEGATIVE 08/18/2021 1654   KETONESUR NEGATIVE 08/18/2021 1654   PROTEINUR 30 (A) 08/18/2021 1654   UROBILINOGEN 1.0 06/17/2012 1909   NITRITE NEGATIVE 08/18/2021 1654   LEUKOCYTESUR NEGATIVE 08/18/2021 1654   Sepsis Labs Recent Labs  Lab 08/18/21 0901 08/19/21 0101  WBC 6.6 9.7   Microbiology Recent Results (from the past 240 hour(s))  Culture, blood (Routine X 2) w Reflex to ID Panel     Status: None (Preliminary result)   Collection Time: 08/18/21  5:00 PM   Specimen: BLOOD  Result Value Ref Range Status   Specimen Description BLOOD LEFT ANTECUBITAL  Final   Special Requests   Final    BOTTLES DRAWN AEROBIC AND ANAEROBIC Blood Culture adequate volume   Culture   Final    NO GROWTH 4 DAYS Performed at [facility], [place] [address] [place], [place] [address]    Report Status PENDING  Incomplete  Culture, blood (Routine X 2) w Reflex to ID Panel     Status: None (Preliminary result)   Collection Time: 08/18/21  5:01 PM   Specimen: BLOOD  Result Value Ref Range Status  Specimen Description BLOOD BLOOD RIGHT HAND  Final   Special Requests    Final    BOTTLES DRAWN AEROBIC AND ANAEROBIC Blood Culture adequate volume   Culture   Final    NO GROWTH 4 DAYS Performed at [facility], [address] [place], [place] [address]    Report Status PENDING  Incomplete     Time coordinating discharge: Over 30 minutes  SIGNED:   Dyshaun Bonzo J [place] DO  [facility] 08/23/2021, 10:20 AM

## 2021-08-23 NOTE — Progress Notes (Signed)
PROGRESS NOTE    [name] [name]  KZS:[id number] DOB: Mar 04, 1961 DOA: 08/05/2021 PCP: Leeroy Cha, MD    Brief Narrative:   [name] [name] is a 61 y.o. female with past medical history significant for dysphagia secondary to moderate esophageal dysmotility (barium swallow 05/2021), psoriatic arthritis, CAD s/p CABG, DM2, chronic systolic congestive heart failure (LVEF 45%), adult failure to thrive who presented to [facility] ED from [facility] SNF on 5/22 for confusion, hypoxia and fever.  Recently hospitalized and discharged to SNF 11 days PTA after treatment for supposed empyema and pneumonia.  Also patient has lost roughly 13 kg over the last 5 months.  Patient was discharged on a pured diet despite family observing several times she had choking episodes after eating food.  In the ED, temperature 101.4 F, BP 101/59, HR 87, RR 27, SPO2 92% on 15 L nonrebreather.  Sodium 149, potassium 5.2, chloride 117, CO2 23, glucose 129, BUN 31, creatinine 1.54.  Alkaline phosphatase 223, AST 75, ALT 21, total bilirubin 1.2.  Lactic acid 3.1, WBC 12.2, hemoglobin 13.7, platelets 179.  INR 1.1.  COVID-19 PCR negative.  Influenza A/B PCR negative.  Urinalysis with moderate leukocytes, negative nitrite, many bacteria, 21-50 WBCs.  CT angiogram chest with no evidence of pulmonary embolism, no evidence of thoracic aortic dissection, noted ectasia ascending thoracic aorta measuring 3.7 cm, CAD, patchy infiltrates both lower lung fields, greatest on the right suggesting pneumonia.  Patient was started on vancomycin and cefepime.  Hospital service consulted for further evaluation and management of acute hypoxic restaurant failure secondary to pneumonia.  Assessment & Plan:    Fever Patient developed fever 101.7 this afternoon.  Unclear etiology, but broad differential given her history of ESBL E. coli UTI/bacteremia, aspiration pneumonia; also consideration for DVT/PE. --Obtain CBC, BMP, urinalysis with urine  culture, chest x-ray, D-dimer, blood cultures x2 --Continue on Augmentin for now as below --Continue monitor fever curve --If D-dimer positive will consider CTA chest/lower extremity duplex ultrasound  Acute hypoxic respiratory failure Aspiration pneumonia Patient presenting to ED from SNF with fever, shortness of breath and found to be hypoxic, tachypneic requiring 15 L nonrebreather on arrival.  Influenza A/B and COVID-19 PCR negative.  Lactic acid elevated 2.5.  Imaging notable for patchy infiltrates bilateral lower lung fields, greatest on right concerning for aspiration pneumonia in the setting of her known esophageal dysmotility; no pulmonary embolism.  Patient was initially started on empiric antibiotics with vancomycin and cefepime and changed to meropenem due to her underlying ESBL E. coli UTI as below.  Patient was able to be titrated off of supplemental oxygen. --Blood cultures x2 5/22: ESBL Ecoli --Blood cultures x2 6/4: No growth x 5 days --Continue meropenem --Aspiration precautions  ESBL E. coli UTI/bacteremia Urinalysis with moderate leukocytes, negative nitrite, many bacteria, 21-50 WBCs.  Urine culture and blood cultures x2 5/22 positive for ESBL E. coli. --Continue meropenem as above  Type 2 diabetes mellitus On metformin 500 mg p.o. twice daily at baseline.  Hemoglobin A1c 6.7, well controlled. --Holding metformin, likely will discontinue on discharge  CAD --Continue statin  HLD: Atorvastatin 80 mg per tube daily  Depression/anxiety: --Lexapro 10 mg per tube daily  GERD: Protonix 40 mg per tube daily  Chronic systolic congestive heart failure, compensated Essential hypertension On amlodipine 2.5 mg p.o. daily, metoprolol tartrate 25 mg PO BID at home. --Continue to hold home amlodipine --Continue metoprolol tartrate 25 mg per tube twice daily --Strict I's and O's and daily weights  Acute renal failure  Etiology likely secondary to poor oral intake in the  setting of dehydration and adult failure to thrive.  Creatinine 1.54 on admission.  Patient was started IV fluid hydration and initially a core track was placed to initiate tube feeds.  Core track has now been transitioned to percutaneous G-tube placement.  Creatinine now improved to 0.49.  Esophageal dysmotility Dysphagia Adult failure to thrive Moderate protein calorie malnutrition Patient is followed by [facility] gastroenterology outpatient.  Has had EGD 08/2020 showing normal-appearing esophagus.  Barium swallow March 2023 with no evidence of stricture and notable moderate esophageal dysmotility.  Seen by gastroenterology once again during his hospitalization with no recommendations of repeating EGD at this time as this will not decrease risk of aspiration pneumonia and will not change her desire to eat; normal will also not improve her esophageal dysmotility.  Interventional radiology was consulted and patient underwent percutaneous G-tube placement on 08/20/2021. --Dietitian consulted to resume tube feeds via G-tube that was placed 6/6  Bolus tube feeding via G-tube: Jevity 1.5 237 ml (1 carton) 5 times per day. Continue Prosource TF 45 ml BID. Free water 60 ml before and after each bolus feeding.   Provides 1855 kcal, 98 gm protein, 900 ml free water daily.   Additional free water flushes 100 ml QID for a total of 1900 ml free water daily.  Nutrition Status: Nutrition Problem: Moderate Malnutrition Etiology: chronic illness (dysphagia) Signs/Symptoms: mild muscle depletion, moderate muscle depletion, mild fat depletion, moderate fat depletion Interventions: Refer to RD note for recommendations   Pressure injury, POA Noted right buttock stage II and right hip stage II pressure injury.  Seen by wound care.  Continue local wound care and offloading.  DVT prophylaxis: heparin injection 5,000 Units Start: 08/05/21 1545    Code Status: Full Code Family Communication:   Disposition Plan:   Level of care: Telemetry Medical Status is: Inpatient Remains inpatient appropriate because: Pending insurance authorization for SNF, [facility]    Consultants:  Infectious disease, Dr. Juleen China - signed off 5/25 Palliative care Gastroenterology, Dr. Alessandra Bevels - signed off 5/31 Interventional radiology  Procedures:  Percutaneous G-tube placement by interventional radiology 6/6  Antimicrobials:  Vancomycin 5/22 - 5/24 Cefepime 5/22 - 5/22 Cefazolin 6/6 - 6/6 Meropenem 5/22 - 5/30, 6/5>>   Subjective: Patient was planned for discharge to SNF today but developed fever this afternoon of unclear etiology.  Wide differential to include pneumonia, UTI, PE/DVT.  Canceling discharge and working up further.  Objective: Vitals:   08/23/21 0412 08/23/21 0835 08/23/21 1235 08/23/21 1349  BP: 118/69 123/75    Pulse: 94 (!) 117    Resp: 14     Temp: 98.4 F (36.9 C) 99.5 F (37.5 C) (!) 100.4 F (38 C) (!) 101.7 F (38.7 C)  TempSrc: Oral Axillary Axillary Rectal  SpO2: 95% 96%    Weight:        Intake/Output Summary (Last 24 hours) at 08/23/2021 1353 Last data filed at 08/23/2021 1143 Gross per 24 hour  Intake 0 ml  Output 0 ml  Net 0 ml   Filed Weights   08/15/21 0500 08/17/21 0500 08/19/21 0445  Weight: 78.5 kg 82.1 kg 77.6 kg    Examination:  Physical Exam: GEN: NAD, alert and oriented x 3, appears older than stated age, chronically ill in appearance HEENT: NCAT, PERRL, EOMI, sclera clear, MMM PULM: Breath sounds slight decreased bilateral bases, normal [place] effort without accessory muscle use, no wheezing/crackles, on room air with SPO2 100% at rest CV:  RRR w/o M/G/R GI: abd soft, NTND, NABS, no R/G/M, noted G-tube abdomen with binder in place MSK: no peripheral edema, muscle strength globally intact 5/5 bilateral upper/lower extremities NEURO: CN II-XII intact, no focal deficits, sensation to light touch intact PSYCH: normal mood/affect Integumentary: Right  hip/buttock stage II wound noted, otherwise no other concerning rashes/lesions/wounds    Data Reviewed: I have personally reviewed following labs and imaging studies  CBC: Recent Labs  Lab 08/18/21 0901 08/19/21 0101  WBC 6.6 9.7  HGB 8.9* 9.8*  HCT 28.0* 30.2*  MCV 94.3 94.4  PLT 374 222   Basic Metabolic Panel: Recent Labs  Lab 08/17/21 0300 08/18/21 0221 08/19/21 0238 08/20/21 0249 08/21/21 0402  NA 138 134* 138 138 139  K 3.9 4.2 4.3 3.7 3.8  CL 102 103 100 98 100  CO2 '29 27 28 31 '$ 32  GLUCOSE 119* 174* 104* 117* 79  BUN '11 11 12 16 12  '$ CREATININE 0.46 0.48 0.53 0.52 0.49  CALCIUM 8.9 8.6* 8.9 9.0 9.2  PHOS 3.5 3.2 3.7 3.5 3.1   GFR: Estimated Creatinine Clearance: 76.7 mL/min (by C-G formula based on SCr of 0.49 mg/dL). Liver Function Tests: Recent Labs  Lab 08/17/21 0300 08/18/21 0221 08/19/21 0238 08/20/21 0249 08/21/21 0402  ALBUMIN 2.2* 2.2* 2.1* 2.1* 2.2*   No results for input(s): "LIPASE", "AMYLASE" in the last 168 hours. No results for input(s): "AMMONIA" in the last 168 hours. Coagulation Profile: Recent Labs  Lab 08/20/21 0249  INR 1.1   Cardiac Enzymes: No results for input(s): "CKTOTAL", "CKMB", "CKMBINDEX", "TROPONINI" in the last 168 hours. BNP (last 3 results) No results for input(s): "PROBNP" in the last 8760 hours. HbA1C: No results for input(s): "HGBA1C" in the last 72 hours. CBG: Recent Labs  Lab 08/22/21 1229 08/22/21 1635 08/22/21 1931 08/23/21 0001 08/23/21 0411  GLUCAP 130* 120* 129* 109* 91   Lipid Profile: No results for input(s): "CHOL", "HDL", "LDLCALC", "TRIG", "CHOLHDL", "LDLDIRECT" in the last 72 hours. Thyroid Function Tests: No results for input(s): "TSH", "T4TOTAL", "FREET4", "T3FREE", "THYROIDAB" in the last 72 hours. Anemia Panel: No results for input(s): "VITAMINB12", "FOLATE", "FERRITIN", "TIBC", "IRON", "RETICCTPCT" in the last 72 hours. Sepsis Labs: No results for input(s): "PROCALCITON",  "LATICACIDVEN" in the last 168 hours.  Recent Results (from the past 240 hour(s))  Culture, blood (Routine X 2) w Reflex to ID Panel     Status: None   Collection Time: 08/18/21  5:00 PM   Specimen: BLOOD  Result Value Ref Range Status   Specimen Description BLOOD LEFT ANTECUBITAL  Final   Special Requests   Final    BOTTLES DRAWN AEROBIC AND ANAEROBIC Blood Culture adequate volume   Culture   Final    NO GROWTH 5 DAYS Performed at [facility], [address] [place], [place] [address]    Report Status 08/23/2021 FINAL  Final  Culture, blood (Routine X 2) w Reflex to ID Panel     Status: None   Collection Time: 08/18/21  5:01 PM   Specimen: BLOOD  Result Value Ref Range Status   Specimen Description BLOOD BLOOD RIGHT HAND  Final   Special Requests   Final    BOTTLES DRAWN AEROBIC AND ANAEROBIC Blood Culture adequate volume   Culture   Final    NO GROWTH 5 DAYS Performed at [facility], [place] [address], [place], [place] [address]    Report Status 08/23/2021 FINAL  Final  Radiology Studies: No results found.      Scheduled Meds:  amoxicillin-clavulanate  1 tablet Per Tube Q12H   atorvastatin  80 mg Per Tube Daily   chlorhexidine  15 mL Mouth Rinse BID   escitalopram  10 mg Per Tube QHS   feeding supplement (JEVITY 1.5 CAL/FIBER)  237 mL Per Tube 5 X Daily   feeding supplement (PROSource TF)  45 mL Per Tube BID   folic acid  1 mg Per Tube Daily   free water  100 mL Per Tube Q6H   free water  120 mL Per Tube 5 X Daily   gabapentin  100 mg Per Tube Q12H   guaiFENesin  15 mL Per Tube Q6H   heparin  5,000 Units Subcutaneous Q8H   liver oil-zinc oxide   Topical QID   mouth rinse  15 mL Mouth Rinse q12n4p   metoprolol tartrate  25 mg Per Tube BID   pantoprazole sodium  40 mg Per Tube Daily   phosphorus  250 mg Per Tube Daily   Continuous Infusions:  sodium chloride 10 mL/hr at 08/22/21 0313     LOS: 18 days    Time spent: 48 minutes spent  on chart review, discussion with nursing staff, consultants, updating family and interview/physical exam; more than 50% of that time was spent in counseling and/or coordination of care.    Viviana Trimble J [place] DO [facility] Available via Epic secure chat 7am-7pm After these hours, please refer to coverage provider listed on [email or web address] 08/23/2021, 1:53 PM

## 2021-08-23 NOTE — Progress Notes (Incomplete)
MD was made aware of the pt's Yellow MEWS. Awaiting for response. HR is currently at 108.  08/23/21 0835  Assess: MEWS Score  Temp 99.5 F (37.5 C)  BP 123/75  MAP (mmHg) 90  Pulse Rate (!) 117  SpO2 96 %  O2 Device Nasal Cannula  Assess: MEWS Score  MEWS Temp 0  MEWS Systolic 0  MEWS Pulse 2  MEWS RR 0  MEWS LOC 0  MEWS Score 2  MEWS Score Color Yellow  Assess: if the MEWS score is Yellow or Red  Were vital signs taken at a resting state? Yes  Focused Assessment No change from prior assessment  Does the patient meet 2 or more of the SIRS criteria? No  MEWS guidelines implemented *See Row Information* No, vital signs rechecked  Assess: SIRS CRITERIA  SIRS Temperature  0  SIRS Pulse 1  SIRS Respirations  0  SIRS WBC 0  SIRS Score Sum  1

## 2021-08-23 NOTE — Progress Notes (Signed)
Pt had a fever and MD and case manager were informed. Tylenol supp was given but still have fever. MD will keep the pt in, discharge order was discontinued. Multiple test were ordered. Fluid started. Yellow MEWS vitals are implemented.  08/23/21 1349  Assess: MEWS Score  Temp (!) 101.7 F (38.7 C)  BP (!) 102/55  Pulse Rate 70  Resp 19  Level of Consciousness Alert  SpO2 92 %  O2 Device Room Air  Assess: MEWS Score  MEWS Temp 2  MEWS Systolic 0  MEWS Pulse 0  MEWS RR 0  MEWS LOC 0  MEWS Score 2  MEWS Score Color Yellow  Assess: if the MEWS score is Yellow or Red  Were vital signs taken at a resting state? Yes  Focused Assessment No change from prior assessment  Does the patient meet 2 or more of the SIRS criteria? No  MEWS guidelines implemented *See Row Information* No, previously yellow, continue vital signs every 4 hours  Treat  Pain Score 0  Faces Pain Scale 0  Escalate  MEWS: Escalate Yellow: discuss with charge nurse/RN and consider discussing with provider and RRT  Notify: Provider  Provider Name/Title [place]  Date Provider Notified 08/23/21  Time Provider Notified 1349  Method of Notification Page  Notification Reason Other (Comment) (Yellow MEWS: Fever.)  Provider response See new orders  Date of Provider Response 08/23/21  Time of Provider Response 1350  Assess: SIRS CRITERIA  SIRS Temperature  1  SIRS Pulse 0  SIRS Respirations  0  SIRS WBC 0  SIRS Score Sum  1

## 2021-08-24 ENCOUNTER — Inpatient Hospital Stay (HOSPITAL_COMMUNITY): Payer: Medicare HMO

## 2021-08-24 DIAGNOSIS — M7989 Other specified soft tissue disorders: Secondary | ICD-10-CM

## 2021-08-24 LAB — BASIC METABOLIC PANEL
Anion gap: 8 (ref 5–15)
BUN: 10 mg/dL (ref 8–23)
CO2: 32 mmol/L (ref 22–32)
Calcium: 8.3 mg/dL — ABNORMAL LOW (ref 8.9–10.3)
Chloride: 98 mmol/L (ref 98–111)
Creatinine, Ser: 0.47 mg/dL (ref 0.44–1.00)
GFR, Estimated: >60 mL/min (ref >60)
Glucose, Bld: 93 mg/dL (ref 70–99)
Potassium: 3.8 mmol/L (ref 3.5–5.1)
Sodium: 138 mmol/L (ref 135–145)

## 2021-08-24 LAB — PROTEIN, TOTAL: Total Protein: 7.2 g/dL (ref 6.5–8.1)

## 2021-08-24 LAB — CBC
HCT: 29 % — ABNORMAL LOW (ref 36.0–46.0)
Hemoglobin: 9 g/dL — ABNORMAL LOW (ref 12.0–15.0)
MCH: 29.3 pg (ref 26.0–34.0)
MCHC: 31 g/dL (ref 30.0–36.0)
MCV: 94.5 fL (ref 80.0–100.0)
Platelets: 395 10*3/uL (ref 150–400)
RBC: 3.07 MIL/uL — ABNORMAL LOW (ref 3.87–5.11)
RDW: 16.2 % — ABNORMAL HIGH (ref 11.5–15.5)
WBC: 5.6 10*3/uL (ref 4.0–10.5)
nRBC: 0 % (ref 0.0–0.2)

## 2021-08-24 LAB — LACTATE DEHYDROGENASE: LDH: 233 U/L — ABNORMAL HIGH (ref 98–192)

## 2021-08-24 LAB — BODY FLUID CELL COUNT WITH DIFFERENTIAL
Eos, Fluid: 0 %
Lymphs, Fluid: 65 %
Monocyte-Macrophage-Serous Fluid: 34 % — ABNORMAL LOW (ref 50–90)
Neutrophil Count, Fluid: 1 % (ref 0–25)
Total Nucleated Cell Count, Fluid: 618 cu mm (ref 0–1000)

## 2021-08-24 LAB — GLUCOSE, CAPILLARY
Glucose-Capillary: 71 mg/dL (ref 70–99)
Glucose-Capillary: 67 mg/dL — ABNORMAL LOW (ref 70–99)
Glucose-Capillary: 125 mg/dL — ABNORMAL HIGH (ref 70–99)
Glucose-Capillary: 120 mg/dL — ABNORMAL HIGH (ref 70–99)
Glucose-Capillary: 97 mg/dL (ref 70–99)

## 2021-08-24 LAB — PROTEIN, PLEURAL OR PERITONEAL FLUID: Total protein, fluid: 5 g/dL

## 2021-08-24 LAB — LACTATE DEHYDROGENASE, PLEURAL OR PERITONEAL FLUID: LD, Fluid: 778 U/L — ABNORMAL HIGH (ref 3–23)

## 2021-08-24 LAB — PROCALCITONIN: Procalcitonin: <0.1 ng/mL

## 2021-08-24 MED ORDER — LIDOCAINE HCL (PF) 1 % IJ SOLN
INTRAMUSCULAR | Status: AC
  Filled 2021-08-24: qty 30

## 2021-08-24 MED ORDER — FUROSEMIDE 20 MG PO TABS
20.0000 mg | ORAL_TABLET | Freq: Every day | ORAL | Status: DC
  Administered 2021-08-25 – 2021-08-26 (×2): 20 mg
  Filled 2021-08-24 (×2): qty 1

## 2021-08-24 MED ORDER — IOHEXOL 350 MG/ML SOLN
75.0000 mL | Freq: Once | INTRAVENOUS | Status: AC | PRN
  Administered 2021-08-24: 75 mL via INTRAVENOUS

## 2021-08-24 MED ORDER — DEXTROSE-NACL 5-0.45 % IV SOLN: INTRAVENOUS | Status: DC

## 2021-08-24 NOTE — Progress Notes (Deleted)
Cardiology Office Note:    Date:  08/24/2021   ID:  [name] [name], DOB 1960-11-22, MRN [id number]  PCP:  Leeroy Cha, MD   [facility] Cardiologist:  Nickey Canedo Martinique, MD     Referring MD: Leeroy Cha,*   No chief complaint on file.   History of Present Illness:    [name] [name] is a 61 y.o. female with a hx of CAD s/ CABG 2017, HTN, HLD, DM II, lupus, and psoriatic arthritis.  Patient initially hospitalized with NSTEMI in April 2017, cardiac catheterization revealed severe three-vessel disease.  She ultimately underwent CABG x3 with LIMA-LAD, SVG-RCA, SVG-OM 2.  Postprocedure, she was discharged on aspirin and Plavix.  She returned back to the hospital in July 2017 with complaint of chest discomfort and noted to have ST elevation in the inferior and lateral leads.  Emergent cardiac catheterization revealed patent LIMA to LAD, occluded SVG to OM, occluded SVG to RCA, distal RCA had 99% stenosis, she ultimately underwent Promus DES to distal RCA.  Plavix switched to Brilinta.  Echocardiogram showed EF 55 to 60%, inferior hypokinesis.  Her Zocor was switched to Lipitor.   Patient was seen in December 2018 with atypical lateral chest pain that is worse with deep breathing.  Troponin was negative.  Echocardiogram showed EF 45 to 50%, this was reviewed and I felt there was no significant change when compared to the previous echo.  Patient was admitted in January 2022 with accelerated hypertension after presenting with chest pain.  Serial troponin was borderline elevated at 23-->29.  She was started on amlodipine, Imdur was increased. She  has been to the emergency room several times since January 2023.  She was seen for chest pain on 04/06/2021 that woke her up from sleep.  Troponin negative x2.  She was treated with sucralfate.   She was admitted to the hospital on 04/30/2021 with altered mental status and was lying partially in bed.  She was found by her son.  On  arrival, she was hypotensive concerning for sepsis. She spent 9 days in the hospital.  Images revealed an empyema, patient underwent thoracentesis on 05/01/2021.  Afterward, she was treated with antibiotics.  Hospital course was complicated by AKI and thrombocytopenia which was felt to be reactive to sepsis.  CT soft tissue of the neck performed on 2/1 revealed thyroid nodule, it was deferred to PCP to consider FNA as outpatient.  Echocardiogram obtained during the hospitalization on 05/01/2021 revealed EF 45%, regional wall motion abnormality with basal to mid inferior, basal to mid inferolateral akinesis suggestive of prior MI, grade 1 DD, RVSP 35.5 mmHg, grade 1 DD.  She was discharged on 2 weeks of Augmentin.  She was readmitted 5/5-5/15/23 generalized weakness. Found to be hypernatremic and was treated for dehydration and bronchiectasis. She was readmitted 5/22-08/23/21 with hypoxia, confusion and fever. She had AKI and aspiration PNA. Also treated for ESBL Ecoli bacteremia. Had severe esophageal dysmotility and had PEG tube placed. She was DC to [facility] facility.     Past Medical History:  Diagnosis Date   Acute ST elevation myocardial infarction (STEMI) involving right coronary artery ([place]) 10/04/2015   x 2   Anxiety    Anxiety and depression    CAD (coronary artery disease)    a. CABG 06/2015: LIMA-LAD, SVG-RCA, SVG-OM2 b. STEMI s/p PCI to distal RCA lesion with a small Promus Premier stent. ( patent LIMA to the LAD, occluded SVG to OM, occluded SVG to RCA.)  CAD (coronary artery disease) of artery bypass graft; occluded SVG to RCA and occluded SVG to OM 10/08/2015   Empty sella (HCC)    GERD (gastroesophageal reflux disease)    Headache    from stress   Hematuria wed 06-13-2020   Hidradenitis axillaris    History of kidney stones    Hyperlipemia    Hypertension    Lupus ([place]) dx'd 2008   "they think I have this; have to get retested" (06/12/2015)   Obesity (BMI 30.0-34.9)     Pneumonia X 2   last time yrs ago   PONV (postoperative nausea and vomiting)    likes iv med or scopolamine patch works well   Psoriasis 06/15/2020   hands elbows, ears, small amount on scalp and bridge of nose   Rheumatoid aortitis    psorriatic arthritis   Rheumatoid arthritis ([place])    S/P angioplasty with stent; 10/04/15 to distal RCA lesion. with Promus premier. 10/08/2015   Scoliosis    Sleep apnea    patient states "it is mild" no cpap needed   Stroke [facility]) March 2014   left MCA branches; "they say I have them all the time", denies residual on 06/12/2015   Tobacco abuse    Type II diabetes mellitus ([place])    Urinary frequency    and urgency   Wears glasses    for reading    Past Surgical History:  Procedure Laterality Date   ABDOMINAL HYSTERECTOMY  31 yrs ago   total has ovaies   BREAST BIOPSY Bilateral yrd ago   benign   CARDIAC CATHETERIZATION N/A 06/13/2015   Procedure: Left Heart Cath and Coronary Angiography;  Surgeon: Keenan Dimitrov M Martinique, MD;  Location: [facility];  Service: Cardiovascular;  Laterality: N/A;   CARDIAC CATHETERIZATION N/A 10/04/2015   Procedure: Left Heart Cath and Coronary Angiography;  Surgeon: [name], MD;  Location: [facility];  Service: Cardiovascular;  Laterality: N/A;   CARDIAC CATHETERIZATION N/A 10/04/2015   Procedure: Coronary Stent Intervention;  Surgeon: [name], MD;  Location: [facility];  Service: Cardiovascular;  Laterality: N/A;   CORONARY ARTERY BYPASS GRAFT N/A 06/19/2015   Procedure: CORONARY ARTERY BYPASS GRAFTING (CABG) TIMES FOUR USING LEFT INTERNAL MAMMARY, RIGHT SAPHENOUS LEG VEIN AND CRYO SAPHENOUS VEIN;  Surgeon: [name], MD;  Location: [facility];  Service: Open Heart Surgery;  Laterality: N/A;   CYSTOSCOPY WITH RETROGRADE PYELOGRAM, URETEROSCOPY AND STENT PLACEMENT Left 06/21/2020   Procedure: CYSTOSCOPY WITH RETROGRADE PYELOGRAM, URETEROSCOPY, BASKET STONE EXTRACTION AND  STENT PLACEMENT;   Surgeon: Robley Fries, MD;  Location: [facility];  Service: Urology;  Laterality: Left;   DILATION AND CURETTAGE OF UTERUS  yrs ago   HOLMIUM LASER APPLICATION Left 08/22/3417   Procedure: HOLMIUM LASER APPLICATION;  Surgeon: Robley Fries, MD;  Location: [facility];  Service: Urology;  Laterality: Left;   HYDRADENITIS EXCISION Right 12/20/2019   Procedure: EXCISION OF RIGHT  HIDRADENITIS AXILLARY;  Surgeon: Donnie Mesa, MD;  Location: [facility];  Service: General;  Laterality: Right;   IR GASTROSTOMY TUBE MOD SED  08/20/2021   IR URETERAL STENT LEFT NEW ACCESS W/O SEP NEPHROSTOMY CATH  01/10/2019   NEPHROLITHOTOMY Left 01/10/2019   Procedure: NEPHROLITHOTOMY PERCUTANEOUS;  Surgeon: Kathie Rhodes, MD;  Location: [facility];  Service: Urology;  Laterality: Left;   TEE WITHOUT CARDIOVERSION N/A 06/19/2015   Procedure: TRANSESOPHAGEAL ECHOCARDIOGRAM (TEE);  Surgeon: [name], MD;  Location: [facility];  Service: Open Heart Surgery;  Laterality: N/A;   TONSILLECTOMY  age 74   TUBAL LIGATION  many yrs ago    Current Medications: No outpatient medications have been marked as taking for the 08/28/21 encounter (Appointment) with Martin[place], Nicco Reaume M, MD.     Allergies:   Apremilast and Latex   Social History   Socioeconomic History   Marital status: Widowed    Spouse name: Not on file   Number of children: 2   Years of education: college   Highest education level: Some college, no degree  Occupational History   Occupation: Disability  Tobacco Use   Smoking status: Former    Packs/day: 0.50    Years: 36.00    Total pack years: 18.00    Types: Cigarettes    Quit date: 06/12/2014    Years since quitting: 7.2   Smokeless tobacco: Never  Vaping Use   Vaping Use: Never used  Substance and Sexual Activity   Alcohol use: No    Alcohol/week: 0.0 standard drinks of alcohol   Drug use: No   Sexual activity: Not Currently  Other Topics Concern   Not on file   Social History Narrative   Patient currently lives at home with her disabled husband who was shot in 2009 and he is total care. She lives with her son and daughter and there is an 8 that comes in. Patient currently is on disability   She occasionally drinks caffeine.    Social Determinants of Health   Financial Resource Strain: High Risk (04/12/2020)   Overall Financial Resource Strain (CARDIA)    Difficulty of Paying Living Expenses: Very hard  Food Insecurity: Food Insecurity Present (11/01/2020)   Hunger Vital Sign    Worried About Running Out of Food in the Last Year: Sometimes true    Ran Out of Food in the Last Year: Sometimes true  Transportation Needs: Unmet Transportation Needs (11/01/2020)   PRAPARE - Transportation    Lack of Transportation (Medical): No    Lack of Transportation (Non-Medical): Yes  Physical Activity: Inactive (04/12/2020)   Exercise Vital Sign    Days of Exercise per Week: 0 days    Minutes of Exercise per Session: 0 min  Stress: No Stress Concern Present (04/12/2020)   [place]    Feeling of Stress : Only a little  Social Connections: Moderately Integrated (04/12/2020)   Social Connection and Isolation Panel [NHANES]    Frequency of Communication with Friends and Family: More than three times a week    Frequency of Social Gatherings with Friends and Family: More than three times a week    Attends Religious Services: 1 to 4 times per year    Active Member of [employer] or Organizations: No    Attends Archivist Meetings: 1 to 4 times per year    Marital Status: Widowed     Family History: The patient's family history includes Alzheimer's disease in an other family member; Breast cancer in her mother and another family member; Depression in her son; Diabetes in her mother and another family member; Heart failure in her mother; Stroke in her maternal aunt.  ROS:   Please see the  history of present illness.     All other systems reviewed and are negative.  EKGs/Labs/Other Studies Reviewed:    The following studies were reviewed today:  Echo 05/01/2021 1. Left ventricular ejection fraction, by estimation, is 45%. The  left  ventricle has mildly decreased function. The left ventricle demonstrates  regional wall motion abnormalities with basal to mid inferior and basal to  mid inferolateral akinesis  suggestive of prior MI. There is moderate left ventricular hypertrophy.  Left ventricular diastolic parameters are consistent with Grade I  diastolic dysfunction (impaired relaxation).   2. Right ventricular systolic function is normal. The right ventricular  size is normal. There is normal pulmonary artery systolic pressure. The  estimated right ventricular systolic pressure is 24.4 mmHg.   3. The mitral valve is normal in structure. No evidence of mitral valve  regurgitation. No evidence of mitral stenosis.   4. The aortic valve is tricuspid. Aortic valve regurgitation is not  visualized. Aortic valve sclerosis/calcification is present, without any  evidence of aortic stenosis.   5. The inferior vena cava is normal in size with greater than 50%  respiratory variability, suggesting right atrial pressure of 3 mmHg.   EKG:  EKG is ordered today.  The ekg ordered today demonstrates normal sinus rhythm, no significant ST-T wave changes.  Recent Labs: 07/19/2021: TSH 1.305 08/07/2021: ALT 17 08/13/2021: Magnesium 1.8 08/17/2021: B Natriuretic Peptide 154.0 08/24/2021: BUN 10; Creatinine, Ser 0.47; Hemoglobin 9.0; Platelets 395; Potassium 3.8; Sodium 138  Recent Lipid Panel    Component Value Date/Time   CHOL 128 10/03/2019 1455   TRIG 211 (H) 10/03/2019 1455   HDL 63 10/03/2019 1455   CHOLHDL 2.0 10/03/2019 1455   CHOLHDL 2.9 10/05/2015 0305   VLDL 28 10/05/2015 0305   LDLCALC 32 10/03/2019 1455     Risk Assessment/Calculations:           Physical Exam:     VS:  There were no vitals taken for this visit.    Wt Readings from Last 3 Encounters:  08/19/21 171 lb (77.6 kg)  07/30/21 150 lb (68 kg)  07/29/21 151 lb 3.8 oz (68.6 kg)     GEN:  Well nourished, well developed in no acute distress HEENT: Normal NECK: No JVD; No carotid bruits LYMPHATICS: No lymphadenopathy CARDIAC: RRR, no murmurs, rubs, gallops RESPIRATORY:  Clear to auscultation without rales, wheezing or rhonchi  ABDOMEN: Soft, non-tender, non-distended MUSCULOSKELETAL:  No edema; No deformity  SKIN: Warm and dry NEUROLOGIC:  Alert and oriented x 3 PSYCHIATRIC:  Normal affect   ASSESSMENT:    No diagnosis found.  PLAN:    In order of problems listed above:  Sinus tachycardia: Likely related to his deconditioning after the recent hospitalization for pneumonia.  Heart rate improved after addition of low-dose beta-blocker.  CAD s/p CABG: Denies any significant chest pain  Hypertension: Blood pressure stable  Hyperlipidemia: On Lipitor  DM2: Managed by primary care provider            Medication Adjustments/Labs and Tests Ordered: Current medicines are reviewed at length with the patient today.  Concerns regarding medicines are outlined above.  No orders of the defined types were placed in this encounter.  No orders of the defined types were placed in this encounter.   There are no Patient Instructions on file for this visit.   Signed, Anagha Loseke Martinique, MD  08/24/2021 9:53 AM    [facility]

## 2021-08-24 NOTE — Progress Notes (Signed)
VASCULAR LAB    Bilateral lower extremity venous duplex has been performed.  See CV proc for preliminary results.   Montrail Mehrer, RVT 08/24/2021, 11:24 AM

## 2021-08-24 NOTE — Progress Notes (Signed)
[facility] notified [facility] 71 patient did not tolerate her feeding last night

## 2021-08-24 NOTE — Progress Notes (Signed)
PROGRESS NOTE    [name] [name]  XBJ:[id number] DOB: [birth year] DOA: 08/05/2021 PCP: Leeroy Cha, MD    Brief Narrative:   [name] [name] is a 61 y.o. female with past medical history significant for dysphagia secondary to moderate esophageal dysmotility (barium swallow 05/2021), psoriatic arthritis, CAD s/p CABG, DM2, chronic systolic congestive heart failure (LVEF 45%), adult failure to thrive who presented to [facility] ED from [facility]F on 5/22 for confusion, hypoxia and fever.  Recently hospitalized and discharged to SNF 11 days PTA after treatment for supposed empyema and pneumonia.  Also patient has lost roughly 13 kg over the last 5 months.  Patient was discharged on a pured diet despite family observing several times she had choking episodes after eating food.  In the ED, temperature 101.4 F, BP 101/59, HR 87, RR 27, SPO2 92% on 15 L nonrebreather.  Sodium 149, potassium 5.2, chloride 117, CO2 23, glucose 129, BUN 31, creatinine 1.54.  Alkaline phosphatase 223, AST 75, ALT 21, total bilirubin 1.2.  Lactic acid 3.1, WBC 12.2, hemoglobin 13.7, platelets 179.  INR 1.1.  COVID-19 PCR negative.  Influenza A/B PCR negative.  Urinalysis with moderate leukocytes, negative nitrite, many bacteria, 21-50 WBCs.  CT angiogram chest with no evidence of pulmonary embolism, no evidence of thoracic aortic dissection, noted ectasia ascending thoracic aorta measuring 3.7 cm, CAD, patchy infiltrates both lower lung fields, greatest on the right suggesting pneumonia.  Patient was started on vancomycin and cefepime.  Hospital service consulted for further evaluation and management of acute hypoxic restaurant failure secondary to pneumonia.  Assessment & Plan:    Fever Elevated D-dimer Large right pleural effusion Patient developed fever 101.7 this afternoon.  Unclear etiology, but broad differential given her history of ESBL E. coli UTI/bacteremia, aspiration pneumonia; Hx empyema also  consideration for DVT/PE.  WBC count 5.6, procalcitonin less than 0.10.  D-dimer elevated.  CTA chest with trace left pleural effusion, enlarging right pleural effusion that is now moderate/large in size. --blood cultures x2: Pending --IR consulted for thoracentesis --Continue on Augmentin for now as below --Vascular duplex ultrasound bilateral lower extremities: Pending --Continue monitor fever curve  Acute hypoxic respiratory failure Aspiration pneumonia Patient presenting to ED from SNF with fever, shortness of breath and found to be hypoxic, tachypneic requiring 15 L nonrebreather on arrival.  Influenza A/B and COVID-19 PCR negative.  Lactic acid elevated 2.5.  Imaging notable for patchy infiltrates bilateral lower lung fields, greatest on right concerning for aspiration pneumonia in the setting of her known esophageal dysmotility; no pulmonary embolism.  Patient was initially started on empiric antibiotics with vancomycin and cefepime and changed to meropenem due to her underlying ESBL E. coli UTI as below.  Patient was able to be titrated off of supplemental oxygen. --Blood cultures x2 5/22: ESBL Ecoli --Blood cultures x2 6/4: No growth x 5 days --Blood cultures x2 6/9: No growth less than 24 hours --Continue Augmentin --IR for thoracentesis as above for large right pleural effusion --Aspiration precautions  ESBL E. coli UTI/bacteremia Urinalysis with moderate leukocytes, negative nitrite, many bacteria, 21-50 WBCs.  Urine culture and blood cultures x2 5/22 positive for ESBL E. coli.  Completed extended course of meropenem while inpatient.  Type 2 diabetes mellitus On metformin 500 mg p.o. twice daily at baseline.  Hemoglobin A1c 6.7, well controlled. --Holding metformin, likely will discontinue on discharge  CAD --Continue statin  HLD: Atorvastatin 80 mg per tube daily  Depression/anxiety: --Lexapro 10 mg per tube daily  GERD: Protonix  40 mg per tube daily  Chronic systolic  congestive heart failure, compensated Essential hypertension On amlodipine 2.5 mg p.o. daily, metoprolol tartrate 25 mg PO BID at home. --Continue to hold home amlodipine --Continue metoprolol tartrate 25 mg per tube twice daily --Strict I's and O's and daily weights  Acute renal failure Etiology likely secondary to poor oral intake in the setting of dehydration and adult failure to thrive.  Creatinine 1.54 on admission.  Patient was started IV fluid hydration and initially a core track was placed to initiate tube feeds.  Core track has now been transitioned to percutaneous G-tube placement.  Creatinine now improved to 0.47.  Esophageal dysmotility Dysphagia Adult failure to thrive Moderate protein calorie malnutrition Patient is followed by [facility] gastroenterology outpatient.  Has had EGD 08/2020 showing normal-appearing esophagus.  Barium swallow March 2023 with no evidence of stricture and notable moderate esophageal dysmotility.  Seen by gastroenterology once again during his hospitalization with no recommendations of repeating EGD at this time as this will not decrease risk of aspiration pneumonia and will not change her desire to eat; normal will also not improve her esophageal dysmotility.  Interventional radiology was consulted and patient underwent percutaneous G-tube placement on 08/20/2021. --Dietitian consulted to resume tube feeds via G-tube that was placed 6/6  Bolus tube feeding via G-tube: Jevity 1.5 237 ml (1 carton) 5 times per day. Continue Prosource TF 45 ml BID. Free water 60 ml before and after each bolus feeding. Provides 1855 kcal, 98 gm protein, 900 ml free water daily. Additional free water flushes 100 ml QID for a total of 1900 ml free water daily.  Nutrition Status: Nutrition Problem: Moderate Malnutrition Etiology: chronic illness (dysphagia) Signs/Symptoms: mild muscle depletion, moderate muscle depletion, mild fat depletion, moderate fat depletion Interventions:  Refer to RD note for recommendations  Pressure injury, POA Noted right buttock stage II and right hip stage II pressure injury.  Seen by wound care.  Continue local wound care and offloading.  DVT prophylaxis: heparin injection 5,000 Units Start: 08/05/21 1545    Code Status: Full Code Family Communication: No family present at bedside this morning, attempted to update patient's son, [name] via telephone this afternoon unsuccessful as no answer.  Disposition Plan:  Level of care: Telemetry Medical Status is: Inpatient Remains inpatient appropriate because: Anticipate discharge to [facility] SNF in 2-3 days    Consultants:  Infectious disease, Dr. Juleen China - signed off 5/25 Palliative care Gastroenterology, Dr. Alessandra Bevels - signed off 5/31 Interventional radiology  Procedures:  Percutaneous G-tube placement by interventional radiology 6/6  Antimicrobials:  Vancomycin 5/22 - 5/24 Cefepime 5/22 - 5/22 Cefazolin 6/6 - 6/6 Meropenem 5/22 - 5/30, 6/5 - 6/9 Augmentin 6/9>>   Subjective: Patient seen and examined at bedside, resting comfortably, lying in bed.  No family present at bedside this morning.  Patient was refusing some of her medications overnight and this morning.  Discussed with patient this morning findings on CT concerning for enlarging right pleural effusion; possibly related to her underlying systolic CHF; but also concern for infectious etiology with history of empyema and being febrile overnight.  Discussed will obtain IR guided thoracentesis today for symptomatic relief as well as fluid analysis.  Attempted to update patient's son via telephone this afternoon unsuccessful as no answer.  Objective: Vitals:   08/23/21 1805 08/23/21 2100 08/24/21 0018 08/24/21 0235  BP: 129/71 137/77 107/67 117/69  Pulse: (!) 101 (!) 110 91 89  Resp:      Temp: 99 F (37.2  C) (!) 101.2 F (38.4 C) 99.1 F (37.3 C) 99.1 F (37.3 C)  TempSrc: Oral Oral Oral Oral  SpO2: 90%      Weight:        Intake/Output Summary (Last 24 hours) at 08/24/2021 1209 Last data filed at 08/24/2021 0902 Gross per 24 hour  Intake 477 ml  Output 2 ml  Net 475 ml   Filed Weights   08/15/21 0500 08/17/21 0500 08/19/21 0445  Weight: 78.5 kg 82.1 kg 77.6 kg    Examination:  Physical Exam: GEN: NAD, alert and oriented x 3, appears older than stated age, chronically ill in appearance HEENT: NCAT, PERRL, EOMI, sclera clear, MMM PULM: Breath sounds slight decreased bilateral bases, normal respiratory effort without accessory muscle use, no wheezing/crackles, on room air with SPO2 92% at rest CV: RRR w/o M/G/R GI: abd soft, NTND, NABS, no R/G/M, noted G-tube abdomen with binder in place MSK: no peripheral edema, muscle strength globally intact 5/5 bilateral upper/lower extremities NEURO: CN II-XII intact, no focal deficits, sensation to light touch intact PSYCH: normal mood/affect Integumentary: Right hip/buttock stage II wound noted, otherwise no other concerning rashes/lesions/wounds    Data Reviewed: I have personally reviewed following labs and imaging studies  CBC: Recent Labs  Lab 08/18/21 0901 08/19/21 0101 08/23/21 1411 08/24/21 0222  WBC 6.6 9.7 6.2 5.6  HGB 8.9* 9.8* 9.1* 9.0*  HCT 28.0* 30.2* 28.8* 29.0*  MCV 94.3 94.4 94.1 94.5  PLT 374 385 429* 742   Basic Metabolic Panel: Recent Labs  Lab 08/18/21 0221 08/19/21 0238 08/20/21 0249 08/21/21 0402 08/23/21 1411 08/24/21 0222  NA 134* 138 138 139 137 138  K 4.2 4.3 3.7 3.8 4.0 3.8  CL 103 100 98 100 98 98  CO2 '27 28 31 '$ 32 31 32  GLUCOSE 174* 104* 117* 79 115* 93  BUN '11 12 16 12 11 10  '$ CREATININE 0.48 0.53 0.52 0.49 0.49 0.47  CALCIUM 8.6* 8.9 9.0 9.2 8.5* 8.3*  PHOS 3.2 3.7 3.5 3.1  --   --    GFR: Estimated Creatinine Clearance: 76.7 mL/min (by C-G formula based on SCr of 0.47 mg/dL). Liver Function Tests: Recent Labs  Lab 08/18/21 0221 08/19/21 0238 08/20/21 0249 08/21/21 0402  08/24/21 0731  PROT  --   --   --   --  7.2  ALBUMIN 2.2* 2.1* 2.1* 2.2*  --    No results for input(s): "LIPASE", "AMYLASE" in the last 168 hours. No results for input(s): "AMMONIA" in the last 168 hours. Coagulation Profile: Recent Labs  Lab 08/20/21 0249  INR 1.1   Cardiac Enzymes: No results for input(s): "CKTOTAL", "CKMB", "CKMBINDEX", "TROPONINI" in the last 168 hours. BNP (last 3 results) No results for input(s): "PROBNP" in the last 8760 hours. HbA1C: No results for input(s): "HGBA1C" in the last 72 hours. CBG: Recent Labs  Lab 08/23/21 2047 08/23/21 2346 08/24/21 0405 08/24/21 0844 08/24/21 0942  GLUCAP 97 145* 71 67* 125*   Lipid Profile: No results for input(s): "CHOL", "HDL", "LDLCALC", "TRIG", "CHOLHDL", "LDLDIRECT" in the last 72 hours. Thyroid Function Tests: No results for input(s): "TSH", "T4TOTAL", "FREET4", "T3FREE", "THYROIDAB" in the last 72 hours. Anemia Panel: No results for input(s): "VITAMINB12", "FOLATE", "FERRITIN", "TIBC", "IRON", "RETICCTPCT" in the last 72 hours. Sepsis Labs: Recent Labs  Lab 08/23/21 1411 08/24/21 0222  PROCALCITON <0.10 <0.10    Recent Results (from the past 240 hour(s))  Culture, blood (Routine X 2) w Reflex to ID Panel  Status: None   Collection Time: 08/18/21  5:00 PM   Specimen: BLOOD  Result Value Ref Range Status   Specimen Description BLOOD LEFT ANTECUBITAL  Final   Special Requests   Final    BOTTLES DRAWN AEROBIC AND ANAEROBIC Blood Culture adequate volume   Culture   Final    NO GROWTH 5 DAYS Performed at [facility], [address] [place], [place] [address]    Report Status 08/23/2021 FINAL  Final  Culture, blood (Routine X 2) w Reflex to ID Panel     Status: None   Collection Time: 08/18/21  5:01 PM   Specimen: BLOOD  Result Value Ref Range Status   Specimen Description BLOOD BLOOD RIGHT HAND  Final   Special Requests   Final    BOTTLES DRAWN AEROBIC AND ANAEROBIC Blood Culture  adequate volume   Culture   Final    NO GROWTH 5 DAYS Performed at [facility], [place] [address], [place], [place] [address]    Report Status 08/23/2021 FINAL  Final  Culture, blood (Routine X 2) w Reflex to ID Panel     Status: None (Preliminary result)   Collection Time: 08/23/21  2:12 PM   Specimen: BLOOD  Result Value Ref Range Status   Specimen Description BLOOD RIGHT ANTECUBITAL  Final   Special Requests   Final    BOTTLES DRAWN AEROBIC AND ANAEROBIC Blood Culture adequate volume   Culture   Final    NO GROWTH < 24 HOURS Performed at [facility], [place] [address], [place], [place] [address]    Report Status PENDING  Incomplete  Culture, blood (Routine X 2) w Reflex to ID Panel     Status: None (Preliminary result)   Collection Time: 08/23/21  2:24 PM   Specimen: BLOOD  Result Value Ref Range Status   Specimen Description BLOOD BLOOD LEFT FOREARM  Final   Special Requests   Final    BOTTLES DRAWN AEROBIC AND ANAEROBIC Blood Culture adequate volume   Culture   Final    NO GROWTH < 24 HOURS Performed at [facility], [place] [address], [place], [place] [address]    Report Status PENDING  Incomplete         Radiology Studies: CT Angio Chest Pulmonary Embolism (PE) W or WO Contrast  Result Date: 08/24/2021 CLINICAL DATA:  Pulmonary embolism (PE) suspected, positive D-dimer. Hypoxia EXAM: CT ANGIOGRAPHY CHEST WITH CONTRAST TECHNIQUE: Multidetector CT imaging of the chest was performed using the standard protocol during bolus administration of intravenous contrast. Multiplanar CT image reconstructions and MIPs were obtained to evaluate the vascular anatomy. RADIATION DOSE REDUCTION: This exam was performed according to the departmental dose-optimization program which includes automated exposure control, adjustment of the mA and/or kV according to patient size and/or use of iterative reconstruction technique. CONTRAST:  28m OMNIPAQUE IOHEXOL 350 MG/ML SOLN  COMPARISON:  08/05/2021 FINDINGS: Cardiovascular: No filling defects in the pulmonary arteries to suggest pulmonary emboli. Heart is normal size. Aorta is normal caliber. Prior CABG. Scattered aortic calcifications. Mediastinum/Nodes: No mediastinal, hilar, or axillary adenopathy. Trachea and esophagus are unremarkable. Thyroid unremarkable. Lungs/Pleura: Moderate to large right pleural effusion, enlarging since prior study. Trace left pleural effusion. Bilateral lower lobe airspace opacities, right greater than left concerning for pneumonia. Airspace disease also noted in the posterior right upper lobe concerning for pneumonia. Airspace disease has worsened since prior study. Upper Abdomen: No acute findings Musculoskeletal: Chest wall soft tissues are  unremarkable. No acute bony abnormality. Review of the MIP images confirms the above findings. IMPRESSION: Trace left pleural effusion. Enlarging right pleural effusion, now moderate to large. Worsening bilateral lower lobe airspace disease and posterior right upper lobe airspace disease concerning for pneumonia. No evidence of pulmonary embolus. Aortic Atherosclerosis (ICD10-I70.0). Electronically Signed   By: Rolm Baptise M.D.   On: 08/24/2021 03:50   DG CHEST PORT 1 VIEW  Result Date: 08/23/2021 CLINICAL DATA:  Altered mental status, chest pain EXAM: PORTABLE CHEST 1 VIEW COMPARISON:  Portable exam 1428 hours compared to 08/20/2021 FINDINGS: Enlargement of cardiac silhouette post CABG. Atherosclerotic calcification aorta. Bibasilar atelectasis with small RIGHT pleural effusion. Patchy infiltrates RIGHT lung. No pneumothorax or acute osseous findings. IMPRESSION: Patchy RIGHT lung infiltrates question asymmetric edema versus pneumonia. Bibasilar atelectasis and small RIGHT pleural effusion. Electronically Signed   By: Lavonia Dana M.D.   On: 08/23/2021 14:41        Scheduled Meds:  amoxicillin-clavulanate  1 tablet Per Tube Q12H   atorvastatin  80 mg Per  Tube Daily   chlorhexidine  15 mL Mouth Rinse BID   escitalopram  10 mg Per Tube QHS   feeding supplement (JEVITY 1.5 CAL/FIBER)  237 mL Per Tube 5 X Daily   feeding supplement (PROSource TF)  45 mL Per Tube BID   folic acid  1 mg Per Tube Daily   free water  100 mL Per Tube Q6H   free water  120 mL Per Tube 5 X Daily   gabapentin  100 mg Per Tube Q12H   guaiFENesin  15 mL Per Tube Q6H   heparin  5,000 Units Subcutaneous Q8H   lidocaine (PF)       liver oil-zinc oxide   Topical QID   mouth rinse  15 mL Mouth Rinse q12n4p   metoprolol tartrate  25 mg Per Tube BID   pantoprazole sodium  40 mg Per Tube Daily   phosphorus  250 mg Per Tube Daily   Continuous Infusions:  sodium chloride 10 mL/hr at 08/22/21 0313   sodium chloride 75 mL/hr at 08/23/21 1802   dextrose 5 % and 0.45% NaCl 50 mL/hr at 08/24/21 0850     LOS: 19 days    Time spent: 48 minutes spent on chart review, discussion with nursing staff, consultants, updating family and interview/physical exam; more than 50% of that time was spent in counseling and/or coordination of care.    Chalsey Leeth J [place] DO [facility] Available via Epic secure chat 7am-7pm After these hours, please refer to coverage provider listed on [email or web address] 08/24/2021, 12:09 PM

## 2021-08-24 NOTE — Progress Notes (Signed)
MD notified that patient refused to have medication given.

## 2021-08-24 NOTE — Progress Notes (Signed)
CMT called 4 beat V-tach vital signs WNL charted in epic patient is asymptomatic Arthor Captain LPN

## 2021-08-24 NOTE — Procedures (Signed)
PROCEDURE SUMMARY:  Successful image-guided right thoracentesis. Yielded 400 mL of hazy yellow fluid. Pt tolerated procedure well. No immediate complications. EBL = trace   Specimen was sent for labs. CXR ordered.  Please see imaging section of Epic for full dictation.  Armando Gang Shade Rivenbark [name] 08/24/2021 12:25 PM

## 2021-08-24 NOTE — Progress Notes (Signed)
Telephone call to CT to inquire about CT scan No answer will try later

## 2021-08-25 LAB — GLUCOSE, CAPILLARY
Glucose-Capillary: 106 mg/dL — ABNORMAL HIGH (ref 70–99)
Glucose-Capillary: 109 mg/dL — ABNORMAL HIGH (ref 70–99)
Glucose-Capillary: 113 mg/dL — ABNORMAL HIGH (ref 70–99)
Glucose-Capillary: 115 mg/dL — ABNORMAL HIGH (ref 70–99)
Glucose-Capillary: 74 mg/dL (ref 70–99)
Glucose-Capillary: 76 mg/dL (ref 70–99)

## 2021-08-25 LAB — BASIC METABOLIC PANEL
Anion gap: 4 — ABNORMAL LOW (ref 5–15)
BUN: 9 mg/dL (ref 8–23)
CO2: 33 mmol/L — ABNORMAL HIGH (ref 22–32)
Calcium: 8.5 mg/dL — ABNORMAL LOW (ref 8.9–10.3)
Chloride: 101 mmol/L (ref 98–111)
Creatinine, Ser: 0.45 mg/dL (ref 0.44–1.00)
GFR, Estimated: >60 mL/min (ref >60)
Glucose, Bld: 95 mg/dL (ref 70–99)
Potassium: 4.1 mmol/L (ref 3.5–5.1)
Sodium: 138 mmol/L (ref 135–145)

## 2021-08-25 LAB — GRAM STAIN

## 2021-08-25 LAB — PROCALCITONIN: Procalcitonin: <0.1 ng/mL

## 2021-08-25 MED ORDER — AMOXICILLIN-POT CLAVULANATE 875-125 MG PO TABS
1.0000 | ORAL_TABLET | Freq: Two times a day (BID) | ORAL | Status: DC
  Administered 2021-08-25 – 2021-08-26 (×3): 1
  Filled 2021-08-25 (×3): qty 1

## 2021-08-25 NOTE — Progress Notes (Signed)
PROGRESS NOTE    [name] [name]  PIR:[id number] DOB: [birth year] DOA: 08/05/2021 PCP: Leeroy Cha, MD    Brief Narrative:   [name] [name] is a 61 y.o. female with past medical history significant for dysphagia secondary to moderate esophageal dysmotility (barium swallow 05/2021), psoriatic arthritis, CAD s/p CABG, DM2, chronic systolic congestive heart failure (LVEF 45%), adult failure to thrive who presented to [facility] ED from [facility] SNF on 5/22 for confusion, hypoxia and fever.  Recently hospitalized and discharged to SNF 11 days PTA after treatment for supposed empyema and pneumonia.  Also patient has lost roughly 13 kg over the last 5 months.  Patient was discharged on a pured diet despite family observing several times she had choking episodes after eating food.  In the ED, temperature 101.4 F, BP 101/59, HR 87, RR 27, SPO2 92% on 15 L nonrebreather.  Sodium 149, potassium 5.2, chloride 117, CO2 23, glucose 129, BUN 31, creatinine 1.54.  Alkaline phosphatase 223, AST 75, ALT 21, total bilirubin 1.2.  Lactic acid 3.1, WBC 12.2, hemoglobin 13.7, platelets 179.  INR 1.1.  COVID-19 PCR negative.  Influenza A/B PCR negative.  Urinalysis with moderate leukocytes, negative nitrite, many bacteria, 21-50 WBCs.  CT angiogram chest with no evidence of pulmonary embolism, no evidence of thoracic aortic dissection, noted ectasia ascending thoracic aorta measuring 3.7 cm, CAD, patchy infiltrates both lower lung fields, greatest on the right suggesting pneumonia.  Patient was started on vancomycin and cefepime.  Hospital service consulted for further evaluation and management of acute hypoxic restaurant failure secondary to pneumonia.  Assessment & Plan:    Fever Elevated D-dimer Large right pleural effusion Patient developed fever 101.7 this afternoon.  Unclear etiology, but broad differential given her history of ESBL E. coli UTI/bacteremia, aspiration pneumonia; Hx empyema also  consideration for DVT/PE.  WBC count 5.6, procalcitonin less than 0.10.  D-dimer elevated.  Vascular duplex ultrasound bilateral lower extremities negative for DVT.  CTA chest with trace left pleural effusion, enlarging right pleural effusion that is now moderate/large in size.  Underwent IR ultrasound-guided thoracentesis on 08/24/2021 with 400 mL fluid removed.  Fluid analysis consistent with exudative effusion. --blood cultures x2: No growth less than 24 hours --Pleural fluid culture: No organisms on Gram stain, further culture pending --Pleural fluid cytology: Pending --Continue on Augmentin for now as below --Furosemide 20 mg per tube daily --Continue monitor fever curve; afebrile now greater than 24 hours  Acute hypoxic respiratory failure Aspiration pneumonia Patient presenting to ED from SNF with fever, shortness of breath and found to be hypoxic, tachypneic requiring 15 L nonrebreather on arrival.  Influenza A/B and COVID-19 PCR negative.  Lactic acid elevated 2.5.  Imaging notable for patchy infiltrates bilateral lower lung fields, greatest on right concerning for aspiration pneumonia in the setting of her known esophageal dysmotility; no pulmonary embolism.  Patient was initially started on empiric antibiotics with vancomycin and cefepime and changed to meropenem due to her underlying ESBL E. coli UTI as below.  Patient was able to be titrated off of supplemental oxygen. --Blood cultures x2 5/22: ESBL Ecoli --Blood cultures x2 6/4: No growth x 5 days --Blood cultures x2 6/9: No growth less than 24 hours --Continue Augmentin --Aspiration precautions  ESBL E. coli UTI/bacteremia Urinalysis with moderate leukocytes, negative nitrite, many bacteria, 21-50 WBCs.  Urine culture and blood cultures x2 5/22 positive for ESBL E. coli.  Completed extended course of meropenem while inpatient.  Type 2 diabetes mellitus On metformin 500 mg p.o.  twice daily at baseline.  Hemoglobin A1c 6.7, well  controlled. --Holding metformin, likely will discontinue on discharge  CAD --Continue statin  HLD: Atorvastatin 80 mg per tube daily  Depression/anxiety: Lexapro 10 mg per tube daily  GERD: Protonix 40 mg per tube daily  Chronic systolic congestive heart failure, compensated Essential hypertension On amlodipine 2.5 mg p.o. daily, metoprolol tartrate 25 mg PO BID at home. --Continue to hold home amlodipine --Continue metoprolol tartrate 25 mg per tube twice daily --Furosemide 20 mg per tube daily --Strict I's and O's and daily weights  Acute renal failure Etiology likely secondary to poor oral intake in the setting of dehydration and adult failure to thrive.  Creatinine 1.54 on admission.  Patient was started IV fluid hydration and initially a core track was placed to initiate tube feeds.  Core track has now been transitioned to percutaneous G-tube placement.  Creatinine now improved to 0.47.  Esophageal dysmotility Dysphagia Adult failure to thrive Moderate protein calorie malnutrition Patient is followed by [facility] gastroenterology outpatient.  Has had EGD 08/2020 showing normal-appearing esophagus.  Barium swallow March 2023 with no evidence of stricture and notable moderate esophageal dysmotility.  Seen by gastroenterology once again during his hospitalization with no recommendations of repeating EGD at this time as this will not decrease risk of aspiration pneumonia and will not change her desire to eat; normal will also not improve her esophageal dysmotility.  Interventional radiology was consulted and patient underwent percutaneous G-tube placement on 08/20/2021. --Continue tube feeds via G-tube that was placed 6/6  Bolus tube feeding via G-tube: Jevity 1.5 237 ml (1 carton) 5 times per day. Continue Prosource TF 45 ml BID. Free water 60 ml before and after each bolus feeding. Provides 1855 kcal, 98 gm protein, 900 ml free water daily. Additional free water flushes 100 ml QID for  a total of 1900 ml free water daily.  Nutrition Status: Nutrition Problem: Moderate Malnutrition Etiology: chronic illness (dysphagia) Signs/Symptoms: mild muscle depletion, moderate muscle depletion, mild fat depletion, moderate fat depletion Interventions: Refer to RD note for recommendations  Pressure injury, POA Noted right buttock stage II and right hip stage II pressure injury.  Seen by wound care.  Continue local wound care and offloading.  DVT prophylaxis: heparin injection 5,000 Units Start: 08/05/21 1545    Code Status: Full Code Family Communication: No family present at bedside this morning, patient's son [name] updated via telephone yesterday afternoon  Disposition Plan:  Level of care: Telemetry Medical Status is: Inpatient Remains inpatient appropriate because: Anticipate discharge to [facility] SNFon 6/12 if remains afebrile    Consultants:  Infectious disease, Dr. Juleen China - signed off 5/25 Palliative care Gastroenterology, Dr. Alessandra Bevels - signed off 5/31 Interventional radiology  Procedures:  Percutaneous G-tube placement by interventional radiology 6/6  Antimicrobials:  Vancomycin 5/22 - 5/24 Cefepime 5/22 - 5/22 Cefazolin 6/6 - 6/6 Meropenem 5/22 - 5/30, 6/5 - 6/9 Augmentin 6/9>>   Subjective: Patient seen and examined at bedside, resting comfortably, lying in bed.  No family present.  RN at bedside.  Patient reports doing well this morning, continues with mild abdominal discomfort relatively unchanged since placing NG tube.  RN reports multiple bowel movements and patient remains afebrile without leukocytosis.  Discussed with patient if remains afebrile likely will discharge back to SNF tomorrow.  No other specific complaints or concerns at this time, denies headache, no dizziness, no chest pain, no shortness of breath, no nausea/vomiting, no focal weakness, no congestion, no paresthesias.  No acute events  overnight per nursing staff.  Objective: Vitals:    08/24/21 1652 08/24/21 2027 08/25/21 0024 08/25/21 0554  BP:  121/71 121/74 126/77  Pulse: 83 92    Resp:   17 20  Temp: (!) 97.5 F (36.4 C) 98.1 F (36.7 C)  97.9 F (36.6 C)  TempSrc: Oral Oral  Oral  SpO2: 97% 94%  90%  Weight:        Intake/Output Summary (Last 24 hours) at 08/25/2021 1040 Last data filed at 08/25/2021 0400 Gross per 24 hour  Intake 5321.88 ml  Output --  Net 5321.88 ml   Filed Weights   08/15/21 0500 08/17/21 0500 08/19/21 0445  Weight: 78.5 kg 82.1 kg 77.6 kg    Examination:  Physical Exam: GEN: NAD, alert and oriented x 3, appears older than stated age, chronically ill in appearance HEENT: NCAT, PERRL, EOMI, sclera clear, MMM PULM: Breath sounds slight decreased bilateral bases, normal respiratory effort without accessory muscle use, no wheezing/crackles, on room air with SPO2 92% at rest CV: RRR w/o M/G/R GI: abd soft, NTND, NABS, no R/G/M, noted G-tube abdomen with binder in place MSK: no peripheral edema, muscle strength globally intact 5/5 bilateral upper/lower extremities NEURO: CN II-XII intact, no focal deficits, sensation to light touch intact PSYCH: normal mood/affect Integumentary: Right hip/buttock stage II wound noted, otherwise no other concerning rashes/lesions/wounds    Data Reviewed: I have personally reviewed following labs and imaging studies  CBC: Recent Labs  Lab 08/19/21 0101 08/23/21 1411 08/24/21 0222  WBC 9.7 6.2 5.6  HGB 9.8* 9.1* 9.0*  HCT 30.2* 28.8* 29.0*  MCV 94.4 94.1 94.5  PLT 385 429* 678   Basic Metabolic Panel: Recent Labs  Lab 08/19/21 0238 08/20/21 0249 08/21/21 0402 08/23/21 1411 08/24/21 0222 08/25/21 0200  NA 138 138 139 137 138 138  K 4.3 3.7 3.8 4.0 3.8 4.1  CL 100 98 100 98 98 101  CO2 28 31 32 31 32 33*  GLUCOSE 104* 117* 79 115* 93 95  BUN '12 16 12 11 10 9  '$ CREATININE 0.53 0.52 0.49 0.49 0.47 0.45  CALCIUM 8.9 9.0 9.2 8.5* 8.3* 8.5*  PHOS 3.7 3.5 3.1  --   --   --     GFR: Estimated Creatinine Clearance: 76.7 mL/min (by C-G formula based on SCr of 0.45 mg/dL). Liver Function Tests: Recent Labs  Lab 08/19/21 0238 08/20/21 0249 08/21/21 0402 08/24/21 0731  PROT  --   --   --  7.2  ALBUMIN 2.1* 2.1* 2.2*  --    No results for input(s): "LIPASE", "AMYLASE" in the last 168 hours. No results for input(s): "AMMONIA" in the last 168 hours. Coagulation Profile: Recent Labs  Lab 08/20/21 0249  INR 1.1   Cardiac Enzymes: No results for input(s): "CKTOTAL", "CKMB", "CKMBINDEX", "TROPONINI" in the last 168 hours. BNP (last 3 results) No results for input(s): "PROBNP" in the last 8760 hours. HbA1C: No results for input(s): "HGBA1C" in the last 72 hours. CBG: Recent Labs  Lab 08/24/21 1646 08/24/21 2019 08/25/21 0018 08/25/21 0408 08/25/21 0802  GLUCAP 120* 97 113* 76 74   Lipid Profile: No results for input(s): "CHOL", "HDL", "LDLCALC", "TRIG", "CHOLHDL", "LDLDIRECT" in the last 72 hours. Thyroid Function Tests: No results for input(s): "TSH", "T4TOTAL", "FREET4", "T3FREE", "THYROIDAB" in the last 72 hours. Anemia Panel: No results for input(s): "VITAMINB12", "FOLATE", "FERRITIN", "TIBC", "IRON", "RETICCTPCT" in the last 72 hours. Sepsis Labs: Recent Labs  Lab 08/23/21 1411 08/24/21 0222 08/25/21 0200  PROCALCITON <0.10 <0.10 <0.10    Recent Results (from the past 240 hour(s))  Culture, blood (Routine X 2) w Reflex to ID Panel     Status: None   Collection Time: 08/18/21  5:00 PM   Specimen: BLOOD  Result Value Ref Range Status   Specimen Description BLOOD LEFT ANTECUBITAL  Final   Special Requests   Final    BOTTLES DRAWN AEROBIC AND ANAEROBIC Blood Culture adequate volume   Culture   Final    NO GROWTH 5 DAYS Performed at [facility], [place] [address], [place], [place] [address]    Report Status 08/23/2021 FINAL  Final  Culture, blood (Routine X 2) w Reflex to ID Panel     Status: None   Collection Time: 08/18/21  5:01  PM   Specimen: BLOOD  Result Value Ref Range Status   Specimen Description BLOOD BLOOD RIGHT HAND  Final   Special Requests   Final    BOTTLES DRAWN AEROBIC AND ANAEROBIC Blood Culture adequate volume   Culture   Final    NO GROWTH 5 DAYS Performed at [facility], [place] [address], [place], [place] [address]    Report Status 08/23/2021 FINAL  Final  Culture, blood (Routine X 2) w Reflex to ID Panel     Status: None (Preliminary result)   Collection Time: 08/23/21  2:12 PM   Specimen: BLOOD  Result Value Ref Range Status   Specimen Description BLOOD RIGHT ANTECUBITAL  Final   Special Requests   Final    BOTTLES DRAWN AEROBIC AND ANAEROBIC Blood Culture adequate volume   Culture   Final    NO GROWTH < 24 HOURS Performed at [facility], [place] [address] [place], [place] [address]    Report Status PENDING  Incomplete  Culture, blood (Routine X 2) w Reflex to ID Panel     Status: None (Preliminary result)   Collection Time: 08/23/21  2:24 PM   Specimen: BLOOD  Result Value Ref Range Status   Specimen Description BLOOD BLOOD LEFT FOREARM  Final   Special Requests   Final    BOTTLES DRAWN AEROBIC AND ANAEROBIC Blood Culture adequate volume   Culture   Final    NO GROWTH < 24 HOURS Performed at [facility], [place] [address] [place], [place] [address]    Report Status PENDING  Incomplete  Gram stain     Status: None   Collection Time: 08/24/21 12:53 PM   Specimen: Pleura  Result Value Ref Range Status   Specimen Description PLEURAL  Final   Special Requests NONE  Final   Gram Stain   Final    RARE WBC PRESENT, PREDOMINANTLY MONONUCLEAR NO ORGANISMS SEEN Performed at [facility], [place] [address], [place],  [address]    Report Status 08/25/2021 FINAL  Final         Radiology Studies: VAS [place] LOWER EXTREMITY VENOUS (DVT)  Result Date: 08/25/2021  Lower Venous DVT Study Patient Name:  [name] [name]  Date of Exam:   08/24/2021 Medical Rec #:  [id number]        Accession #:    [id number] Date of Birth: 1960-05-23        Patient Gender: F Patient Age:   63 years Exam Location:  [facility] Procedure:      VAS [place] LOWER EXTREMITY VENOUS (DVT) Referring Phys: Kyara Boxer [place] --------------------------------------------------------------------------------  Indications: Swelling.  Comparison Study: Prior negative  bilateral LEV done 07/21/2021 Performing Technologist: Sharion Dove RVS  Examination Guidelines: A complete evaluation includes B-mode imaging, spectral Doppler, color Doppler, and power Doppler as needed of all accessible portions of each vessel. Bilateral testing is considered an integral part of a complete examination. Limited examinations for reoccurring indications may be performed as noted. The reflux portion of the exam is performed with the patient in reverse Trendelenburg.  +---------+---------------+---------+-----------+----------+--------------+ RIGHT    CompressibilityPhasicitySpontaneityPropertiesThrombus Aging +---------+---------------+---------+-----------+----------+--------------+ CFV      Full           Yes      Yes                                 +---------+---------------+---------+-----------+----------+--------------+ SFJ      Full                                                        +---------+---------------+---------+-----------+----------+--------------+ FV Prox  Full                                                        +---------+---------------+---------+-----------+----------+--------------+ FV Mid   Full                                                        +---------+---------------+---------+-----------+----------+--------------+ FV DistalFull                                                        +---------+---------------+---------+-----------+----------+--------------+ PFV      Full                                                         +---------+---------------+---------+-----------+----------+--------------+ POP      Full           Yes      Yes                                 +---------+---------------+---------+-----------+----------+--------------+ PTV      Full                                                        +---------+---------------+---------+-----------+----------+--------------+ PERO     Full                                                        +---------+---------------+---------+-----------+----------+--------------+   +---------+---------------+---------+-----------+----------+--------------+  LEFT     CompressibilityPhasicitySpontaneityPropertiesThrombus Aging +---------+---------------+---------+-----------+----------+--------------+ CFV      Full           Yes      Yes                                 +---------+---------------+---------+-----------+----------+--------------+ SFJ      Full                                                        +---------+---------------+---------+-----------+----------+--------------+ FV Prox  Full                                                        +---------+---------------+---------+-----------+----------+--------------+ FV Mid   Full                                                        +---------+---------------+---------+-----------+----------+--------------+ FV DistalFull                                                        +---------+---------------+---------+-----------+----------+--------------+ PFV      Full                                                        +---------+---------------+---------+-----------+----------+--------------+ POP      Full           Yes      Yes                                 +---------+---------------+---------+-----------+----------+--------------+ PTV      Full                                                         +---------+---------------+---------+-----------+----------+--------------+ PERO     Full                                                        +---------+---------------+---------+-----------+----------+--------------+     Summary: BILATERAL: - No evidence of deep vein thrombosis seen in the lower extremities, bilaterally. -No evidence of popliteal cyst, bilaterally. RIGHT: - Ultrasound characteristics of enlarged lymph nodes are noted in the groin.  LEFT: - Ultrasound characteristics of enlarged lymph nodes  noted in the groin.  *See table(s) above for measurements and observations. Electronically signed by Servando Snare MD on 08/25/2021 at 10:08:44 AM.    Final    US THORACENTESIS ASP PLEURAL SPACE W/IMG GUIDE  Result Date: 08/24/2021 INDICATION: Shortness of breath, previous chest x-ray showed right pleural effusion. Request for therapeutic and diagnostic thoracentesis. EXAM: ULTRASOUND GUIDED RIGHT THORACENTESIS MEDICATIONS: 10 mL 1% lidocaine COMPLICATIONS: None immediate. PROCEDURE: An ultrasound guided thoracentesis was thoroughly discussed with the patient and questions answered. The benefits, risks, alternatives and complications were also discussed. The patient understands and wishes to proceed with the procedure. Written consent was obtained. Ultrasound was performed to localize and mark an adequate pocket of fluid in the right chest. The area was then prepped and draped in the normal sterile fashion. 1% Lidocaine was used for local anesthesia. Under ultrasound guidance a 6 Fr Safe-T-Centesis catheter was introduced. Thoracentesis was performed. The catheter was removed and a dressing applied. FINDINGS: A total of approximately 400 mL of hazy yellow fluid was removed. Samples were sent to the laboratory as requested by the clinical team. Post procedure chest X-ray reviewed, negative for pneumothorax. IMPRESSION: Successful ultrasound guided right thoracentesis yielding 400 mL of pleural fluid.  Read by: Durenda Guthrie, [name] Electronically Signed   By: Lucrezia Europe M.D.   On: 08/24/2021 13:48   DG Chest 1 View  Result Date: 08/24/2021 CLINICAL DATA:  S/p right Tobias Alexander [id number] EXAM: CHEST  1 VIEW COMPARISON:  Radiograph 08/23/2021 FINDINGS: Reduction in RIGHT pleural fluid. No pneumothorax. Stable enlarged cardiac silhouette. Persistent mild RIGHT effusion. IMPRESSION: 1. No pneumothorax. 2. Reduction in RIGHT pleural fluid Electronically Signed   By: Suzy Bouchard M.D.   On: 08/24/2021 13:20   CT Angio Chest Pulmonary Embolism (PE) W or WO Contrast  Result Date: 08/24/2021 CLINICAL DATA:  Pulmonary embolism (PE) suspected, positive D-dimer. Hypoxia EXAM: CT ANGIOGRAPHY CHEST WITH CONTRAST TECHNIQUE: Multidetector CT imaging of the chest was performed using the standard protocol during bolus administration of intravenous contrast. Multiplanar CT image reconstructions and MIPs were obtained to evaluate the vascular anatomy. RADIATION DOSE REDUCTION: This exam was performed according to the departmental dose-optimization program which includes automated exposure control, adjustment of the mA and/or kV according to patient size and/or use of iterative reconstruction technique. CONTRAST:  32m OMNIPAQUE IOHEXOL 350 MG/ML SOLN COMPARISON:  08/05/2021 FINDINGS: Cardiovascular: No filling defects in the pulmonary arteries to suggest pulmonary emboli. Heart is normal size. Aorta is normal caliber. Prior CABG. Scattered aortic calcifications. Mediastinum/Nodes: No mediastinal, hilar, or axillary adenopathy. Trachea and esophagus are unremarkable. Thyroid unremarkable. Lungs/Pleura: Moderate to large right pleural effusion, enlarging since prior study. Trace left pleural effusion. Bilateral lower lobe airspace opacities, right greater than left concerning for pneumonia. Airspace disease also noted in the posterior right upper lobe concerning for pneumonia. Airspace disease has worsened since prior study. Upper  Abdomen: No acute findings Musculoskeletal: Chest wall soft tissues are unremarkable. No acute bony abnormality. Review of the MIP images confirms the above findings. IMPRESSION: Trace left pleural effusion. Enlarging right pleural effusion, now moderate to large. Worsening bilateral lower lobe airspace disease and posterior right upper lobe airspace disease concerning for pneumonia. No evidence of pulmonary embolus. Aortic Atherosclerosis (ICD10-I70.0). Electronically Signed   By: KRolm BaptiseM.D.   On: 08/24/2021 03:50   DG CHEST PORT 1 VIEW  Result Date: 08/23/2021 CLINICAL DATA:  Altered mental status, chest pain EXAM: PORTABLE CHEST 1 VIEW COMPARISON:  Portable exam 1428 hours compared  to 08/20/2021 FINDINGS: Enlargement of cardiac silhouette post CABG. Atherosclerotic calcification aorta. Bibasilar atelectasis with small RIGHT pleural effusion. Patchy infiltrates RIGHT lung. No pneumothorax or acute osseous findings. IMPRESSION: Patchy RIGHT lung infiltrates question asymmetric edema versus pneumonia. Bibasilar atelectasis and small RIGHT pleural effusion. Electronically Signed   By: Lavonia Dana M.D.   On: 08/23/2021 14:41        Scheduled Meds:  amoxicillin-clavulanate  1 tablet Per Tube Q12H   atorvastatin  80 mg Per Tube Daily   chlorhexidine  15 mL Mouth Rinse BID   escitalopram  10 mg Per Tube QHS   feeding supplement (JEVITY 1.5 CAL/FIBER)  237 mL Per Tube 5 X Daily   feeding supplement (PROSource TF)  45 mL Per Tube BID   folic acid  1 mg Per Tube Daily   free water  100 mL Per Tube Q6H   free water  120 mL Per Tube 5 X Daily   furosemide  20 mg Per Tube Daily   gabapentin  100 mg Per Tube Q12H   guaiFENesin  15 mL Per Tube Q6H   heparin  5,000 Units Subcutaneous Q8H   liver oil-zinc oxide   Topical QID   mouth rinse  15 mL Mouth Rinse q12n4p   metoprolol tartrate  25 mg Per Tube BID   pantoprazole sodium  40 mg Per Tube Daily   phosphorus  250 mg Per Tube Daily    Continuous Infusions:  sodium chloride 10 mL/hr at 08/22/21 0313     LOS: 20 days    Time spent: 48 minutes spent on chart review, discussion with nursing staff, consultants, updating family and interview/physical exam; more than 50% of that time was spent in counseling and/or coordination of care.    Kenecia Barren J [place] DO [facility] Available via Epic secure chat 7am-7pm After these hours, please refer to coverage provider listed on [email or web address] 08/25/2021, 10:40 AM

## 2021-08-26 DIAGNOSIS — R627 Adult failure to thrive: Secondary | ICD-10-CM | POA: Diagnosis not present

## 2021-08-26 DIAGNOSIS — R2681 Unsteadiness on feet: Secondary | ICD-10-CM | POA: Diagnosis not present

## 2021-08-26 DIAGNOSIS — J9691 Respiratory failure, unspecified with hypoxia: Secondary | ICD-10-CM | POA: Diagnosis present

## 2021-08-26 DIAGNOSIS — M069 Rheumatoid arthritis, unspecified: Secondary | ICD-10-CM | POA: Diagnosis not present

## 2021-08-26 DIAGNOSIS — I69328 Other speech and language deficits following cerebral infarction: Secondary | ICD-10-CM | POA: Diagnosis not present

## 2021-08-26 DIAGNOSIS — I69391 Dysphagia following cerebral infarction: Secondary | ICD-10-CM | POA: Diagnosis not present

## 2021-08-26 DIAGNOSIS — R0902 Hypoxemia: Secondary | ICD-10-CM | POA: Diagnosis not present

## 2021-08-26 DIAGNOSIS — Z8673 Personal history of transient ischemic attack (TIA), and cerebral infarction without residual deficits: Secondary | ICD-10-CM | POA: Diagnosis not present

## 2021-08-26 DIAGNOSIS — M6259 Muscle wasting and atrophy, not elsewhere classified, multiple sites: Secondary | ICD-10-CM | POA: Diagnosis not present

## 2021-08-26 DIAGNOSIS — E785 Hyperlipidemia, unspecified: Secondary | ICD-10-CM | POA: Diagnosis not present

## 2021-08-26 DIAGNOSIS — I248 Other forms of acute ischemic heart disease: Secondary | ICD-10-CM | POA: Diagnosis present

## 2021-08-26 DIAGNOSIS — R41841 Cognitive communication deficit: Secondary | ICD-10-CM | POA: Diagnosis not present

## 2021-08-26 DIAGNOSIS — I25810 Atherosclerosis of coronary artery bypass graft(s) without angina pectoris: Secondary | ICD-10-CM | POA: Diagnosis present

## 2021-08-26 DIAGNOSIS — E119 Type 2 diabetes mellitus without complications: Secondary | ICD-10-CM | POA: Diagnosis present

## 2021-08-26 DIAGNOSIS — I2510 Atherosclerotic heart disease of native coronary artery without angina pectoris: Secondary | ICD-10-CM | POA: Diagnosis not present

## 2021-08-26 DIAGNOSIS — Z7401 Bed confinement status: Secondary | ICD-10-CM | POA: Diagnosis not present

## 2021-08-26 DIAGNOSIS — J690 Pneumonitis due to inhalation of food and vomit: Secondary | ICD-10-CM | POA: Diagnosis not present

## 2021-08-26 DIAGNOSIS — Z66 Do not resuscitate: Secondary | ICD-10-CM | POA: Diagnosis not present

## 2021-08-26 DIAGNOSIS — G9341 Metabolic encephalopathy: Secondary | ICD-10-CM | POA: Diagnosis not present

## 2021-08-26 DIAGNOSIS — R4182 Altered mental status, unspecified: Secondary | ICD-10-CM | POA: Diagnosis not present

## 2021-08-26 DIAGNOSIS — R0789 Other chest pain: Secondary | ICD-10-CM | POA: Diagnosis not present

## 2021-08-26 DIAGNOSIS — E870 Hyperosmolality and hypernatremia: Secondary | ICD-10-CM | POA: Diagnosis not present

## 2021-08-26 DIAGNOSIS — L89312 Pressure ulcer of right buttock, stage 2: Secondary | ICD-10-CM | POA: Diagnosis present

## 2021-08-26 DIAGNOSIS — I5022 Chronic systolic (congestive) heart failure: Secondary | ICD-10-CM | POA: Diagnosis not present

## 2021-08-26 DIAGNOSIS — K219 Gastro-esophageal reflux disease without esophagitis: Secondary | ICD-10-CM | POA: Diagnosis not present

## 2021-08-26 DIAGNOSIS — E871 Hypo-osmolality and hyponatremia: Secondary | ICD-10-CM | POA: Diagnosis not present

## 2021-08-26 DIAGNOSIS — I5042 Chronic combined systolic (congestive) and diastolic (congestive) heart failure: Secondary | ICD-10-CM | POA: Diagnosis present

## 2021-08-26 DIAGNOSIS — L89310 Pressure ulcer of right buttock, unstageable: Secondary | ICD-10-CM | POA: Diagnosis not present

## 2021-08-26 DIAGNOSIS — R52 Pain, unspecified: Secondary | ICD-10-CM | POA: Diagnosis not present

## 2021-08-26 DIAGNOSIS — R000 Tachycardia, unspecified: Secondary | ICD-10-CM | POA: Diagnosis not present

## 2021-08-26 DIAGNOSIS — J90 Pleural effusion, not elsewhere classified: Secondary | ICD-10-CM | POA: Diagnosis not present

## 2021-08-26 DIAGNOSIS — F32A Depression, unspecified: Secondary | ICD-10-CM | POA: Diagnosis present

## 2021-08-26 DIAGNOSIS — L89150 Pressure ulcer of sacral region, unstageable: Secondary | ICD-10-CM | POA: Diagnosis not present

## 2021-08-26 DIAGNOSIS — M329 Systemic lupus erythematosus, unspecified: Secondary | ICD-10-CM | POA: Diagnosis present

## 2021-08-26 DIAGNOSIS — J9621 Acute and chronic respiratory failure with hypoxia: Secondary | ICD-10-CM | POA: Diagnosis not present

## 2021-08-26 DIAGNOSIS — R6520 Severe sepsis without septic shock: Secondary | ICD-10-CM | POA: Diagnosis present

## 2021-08-26 DIAGNOSIS — M6281 Muscle weakness (generalized): Secondary | ICD-10-CM | POA: Diagnosis not present

## 2021-08-26 DIAGNOSIS — R531 Weakness: Secondary | ICD-10-CM | POA: Diagnosis not present

## 2021-08-26 DIAGNOSIS — I110 Hypertensive heart disease with heart failure: Secondary | ICD-10-CM | POA: Diagnosis present

## 2021-08-26 DIAGNOSIS — L8992 Pressure ulcer of unspecified site, stage 2: Secondary | ICD-10-CM | POA: Diagnosis not present

## 2021-08-26 DIAGNOSIS — R1310 Dysphagia, unspecified: Secondary | ICD-10-CM | POA: Diagnosis not present

## 2021-08-26 DIAGNOSIS — R042 Hemoptysis: Secondary | ICD-10-CM | POA: Diagnosis not present

## 2021-08-26 DIAGNOSIS — B9562 Methicillin resistant Staphylococcus aureus infection as the cause of diseases classified elsewhere: Secondary | ICD-10-CM | POA: Diagnosis not present

## 2021-08-26 DIAGNOSIS — G629 Polyneuropathy, unspecified: Secondary | ICD-10-CM | POA: Diagnosis not present

## 2021-08-26 DIAGNOSIS — E118 Type 2 diabetes mellitus with unspecified complications: Secondary | ICD-10-CM | POA: Diagnosis not present

## 2021-08-26 DIAGNOSIS — E860 Dehydration: Secondary | ICD-10-CM | POA: Diagnosis present

## 2021-08-26 DIAGNOSIS — J159 Unspecified bacterial pneumonia: Secondary | ICD-10-CM | POA: Diagnosis not present

## 2021-08-26 DIAGNOSIS — Z515 Encounter for palliative care: Secondary | ICD-10-CM | POA: Diagnosis not present

## 2021-08-26 DIAGNOSIS — Z91040 Latex allergy status: Secondary | ICD-10-CM | POA: Diagnosis not present

## 2021-08-26 DIAGNOSIS — E1129 Type 2 diabetes mellitus with other diabetic kidney complication: Secondary | ICD-10-CM | POA: Diagnosis not present

## 2021-08-26 DIAGNOSIS — E11649 Type 2 diabetes mellitus with hypoglycemia without coma: Secondary | ICD-10-CM | POA: Diagnosis present

## 2021-08-26 DIAGNOSIS — Z7189 Other specified counseling: Secondary | ICD-10-CM | POA: Diagnosis not present

## 2021-08-26 DIAGNOSIS — Z951 Presence of aortocoronary bypass graft: Secondary | ICD-10-CM | POA: Diagnosis not present

## 2021-08-26 DIAGNOSIS — J189 Pneumonia, unspecified organism: Secondary | ICD-10-CM | POA: Diagnosis not present

## 2021-08-26 DIAGNOSIS — R079 Chest pain, unspecified: Secondary | ICD-10-CM | POA: Diagnosis not present

## 2021-08-26 DIAGNOSIS — Z7982 Long term (current) use of aspirin: Secondary | ICD-10-CM | POA: Diagnosis not present

## 2021-08-26 DIAGNOSIS — L89152 Pressure ulcer of sacral region, stage 2: Secondary | ICD-10-CM | POA: Diagnosis present

## 2021-08-26 DIAGNOSIS — I160 Hypertensive urgency: Secondary | ICD-10-CM | POA: Diagnosis not present

## 2021-08-26 DIAGNOSIS — N179 Acute kidney failure, unspecified: Secondary | ICD-10-CM | POA: Diagnosis not present

## 2021-08-26 DIAGNOSIS — R7881 Bacteremia: Secondary | ICD-10-CM | POA: Diagnosis not present

## 2021-08-26 DIAGNOSIS — R1312 Dysphagia, oropharyngeal phase: Secondary | ICD-10-CM | POA: Diagnosis not present

## 2021-08-26 DIAGNOSIS — Z741 Need for assistance with personal care: Secondary | ICD-10-CM | POA: Diagnosis not present

## 2021-08-26 DIAGNOSIS — A419 Sepsis, unspecified organism: Secondary | ICD-10-CM | POA: Diagnosis not present

## 2021-08-26 DIAGNOSIS — E440 Moderate protein-calorie malnutrition: Secondary | ICD-10-CM | POA: Diagnosis not present

## 2021-08-26 DIAGNOSIS — A4102 Sepsis due to Methicillin resistant Staphylococcus aureus: Secondary | ICD-10-CM | POA: Diagnosis not present

## 2021-08-26 DIAGNOSIS — J9601 Acute respiratory failure with hypoxia: Secondary | ICD-10-CM | POA: Diagnosis not present

## 2021-08-26 DIAGNOSIS — E876 Hypokalemia: Secondary | ICD-10-CM | POA: Diagnosis not present

## 2021-08-26 DIAGNOSIS — J9622 Acute and chronic respiratory failure with hypercapnia: Secondary | ICD-10-CM | POA: Diagnosis not present

## 2021-08-26 LAB — GLUCOSE, CAPILLARY
Glucose-Capillary: 82 mg/dL (ref 70–99)
Glucose-Capillary: 75 mg/dL (ref 70–99)
Glucose-Capillary: 154 mg/dL — ABNORMAL HIGH (ref 70–99)

## 2021-08-26 LAB — CYTOLOGY - NON PAP

## 2021-08-26 LAB — PHOSPHORUS: Phosphorus: 3 mg/dL (ref 2.5–4.6)

## 2021-08-26 MED ORDER — FUROSEMIDE 20 MG PO TABS: 20.0000 mg | ORAL_TABLET | Freq: Every day | ORAL | Status: AC

## 2021-08-26 MED ORDER — AMOXICILLIN-POT CLAVULANATE 875-125 MG PO TABS: 1.0000 | ORAL_TABLET | Freq: Two times a day (BID) | ORAL | 0 refills | Status: AC

## 2021-08-26 MED ORDER — METOCLOPRAMIDE HCL 10 MG PO TABS: 10.0000 mg | ORAL_TABLET | Freq: Three times a day (TID) | ORAL | 1 refills | Status: AC | PRN

## 2021-08-26 MED ORDER — ONDANSETRON HCL 4 MG PO TABS: 4.0000 mg | ORAL_TABLET | Freq: Three times a day (TID) | ORAL | 1 refills | Status: AC | PRN

## 2021-08-26 NOTE — Progress Notes (Signed)
Report given to Dexter at [facility].  PTAR picked up pt at this time

## 2021-08-26 NOTE — TOC Transition Note (Signed)
Transition of Care [facility]) - CM/SW Discharge Note   Patient Details  Name: [name] [name] MRN: [id number] Date of Birth: 1960/07/30  Transition of Care [facility]) CM/SW Contact:  Joanne Chars, LCSW Phone Number: 08/26/2021, 10:18 AM   Clinical Narrative:   Pt discharging to [place].  RN call [phone] for report.     Final next level of care: Skilled Nursing Facility Barriers to Discharge: Barriers Resolved   Patient Goals and CMS Choice     Choice offered to / list presented to : Adult Children (son Rolin Barry)  Discharge Placement              Patient chooses bed at: [facility] Patient to be transferred to facility by: [place] Name of family member notified: son [name] Patient and family notified of of transfer: 08/26/21  Discharge Plan and Services In-house Referral: Clinical Social Work   Post Acute Care Choice: [place]                               Social Determinants of Health (SDOH) Interventions     Readmission Risk Interventions     No data to display

## 2021-08-27 DIAGNOSIS — M6281 Muscle weakness (generalized): Secondary | ICD-10-CM | POA: Diagnosis not present

## 2021-08-27 DIAGNOSIS — Z951 Presence of aortocoronary bypass graft: Secondary | ICD-10-CM | POA: Diagnosis not present

## 2021-08-27 DIAGNOSIS — Z8673 Personal history of transient ischemic attack (TIA), and cerebral infarction without residual deficits: Secondary | ICD-10-CM | POA: Diagnosis not present

## 2021-08-27 DIAGNOSIS — R1312 Dysphagia, oropharyngeal phase: Secondary | ICD-10-CM | POA: Diagnosis not present

## 2021-08-28 ENCOUNTER — Ambulatory Visit: Payer: Medicare HMO | Admitting: Cardiology

## 2021-08-28 LAB — CULTURE, BLOOD (ROUTINE X 2)
Culture: NO GROWTH
Culture: NO GROWTH
Special Requests: ADEQUATE
Special Requests: ADEQUATE

## 2021-08-29 LAB — CULTURE, BODY FLUID W GRAM STAIN -BOTTLE: Culture: NO GROWTH

## 2021-09-03 ENCOUNTER — Inpatient Hospital Stay: Payer: Medicare HMO | Admitting: Pulmonary Disease

## 2021-09-03 DIAGNOSIS — J159 Unspecified bacterial pneumonia: Secondary | ICD-10-CM | POA: Diagnosis not present

## 2021-09-04 ENCOUNTER — Encounter: Payer: Self-pay | Admitting: Cardiology

## 2021-09-10 ENCOUNTER — Emergency Department (HOSPITAL_COMMUNITY): Payer: Medicare HMO

## 2021-09-10 ENCOUNTER — Other Ambulatory Visit: Payer: Self-pay

## 2021-09-10 ENCOUNTER — Emergency Department (HOSPITAL_COMMUNITY)
Admission: EM | Admit: 2021-09-10 | Discharge: 2021-09-10 | Disposition: A | Discharge status: Skilled Nursing Facility | Payer: Medicare HMO | Attending: Emergency Medicine | Admitting: Emergency Medicine

## 2021-09-10 DIAGNOSIS — J90 Pleural effusion, not elsewhere classified: Secondary | ICD-10-CM | POA: Diagnosis not present

## 2021-09-10 DIAGNOSIS — R042 Hemoptysis: Secondary | ICD-10-CM | POA: Diagnosis not present

## 2021-09-10 DIAGNOSIS — Z7982 Long term (current) use of aspirin: Secondary | ICD-10-CM | POA: Insufficient documentation

## 2021-09-10 DIAGNOSIS — Z91040 Latex allergy status: Secondary | ICD-10-CM | POA: Diagnosis not present

## 2021-09-10 DIAGNOSIS — J189 Pneumonia, unspecified organism: Secondary | ICD-10-CM | POA: Diagnosis not present

## 2021-09-10 DIAGNOSIS — R000 Tachycardia, unspecified: Secondary | ICD-10-CM | POA: Diagnosis not present

## 2021-09-10 LAB — COMPREHENSIVE METABOLIC PANEL
ALT: 24 U/L (ref 0–44)
AST: 58 U/L — ABNORMAL HIGH (ref 15–41)
Albumin: 1.9 g/dL — ABNORMAL LOW (ref 3.5–5.0)
Alkaline Phosphatase: 115 U/L (ref 38–126)
Anion gap: 11 (ref 5–15)
BUN: 17 mg/dL (ref 8–23)
CO2: 27 mmol/L (ref 22–32)
Calcium: 8.9 mg/dL (ref 8.9–10.3)
Chloride: 101 mmol/L (ref 98–111)
Creatinine, Ser: 0.56 mg/dL (ref 0.44–1.00)
GFR, Estimated: >60 mL/min (ref >60)
Glucose, Bld: 84 mg/dL (ref 70–99)
Potassium: 4.4 mmol/L (ref 3.5–5.1)
Sodium: 139 mmol/L (ref 135–145)
Total Bilirubin: 0.6 mg/dL (ref 0.3–1.2)
Total Protein: 8.3 g/dL — ABNORMAL HIGH (ref 6.5–8.1)

## 2021-09-10 LAB — CBC WITH DIFFERENTIAL/PLATELET
Abs Immature Granulocytes: 0 10*3/uL (ref 0.00–0.07)
Basophils Absolute: 0 10*3/uL (ref 0.0–0.1)
Basophils Relative: 0 %
Eosinophils Absolute: 0 10*3/uL (ref 0.0–0.5)
Eosinophils Relative: 0 %
HCT: 34.9 % — ABNORMAL LOW (ref 36.0–46.0)
Hemoglobin: 10.8 g/dL — ABNORMAL LOW (ref 12.0–15.0)
Lymphocytes Relative: 4 %
Lymphs Abs: 0.3 10*3/uL — ABNORMAL LOW (ref 0.7–4.0)
MCH: 29 pg (ref 26.0–34.0)
MCHC: 30.9 g/dL (ref 30.0–36.0)
MCV: 93.8 fL (ref 80.0–100.0)
Monocytes Absolute: 0.3 10*3/uL (ref 0.1–1.0)
Monocytes Relative: 4 %
Neutro Abs: 5.9 10*3/uL (ref 1.7–7.7)
Neutrophils Relative %: 92 %
Platelets: 264 10*3/uL (ref 150–400)
RBC: 3.72 MIL/uL — ABNORMAL LOW (ref 3.87–5.11)
RDW: 16.7 % — ABNORMAL HIGH (ref 11.5–15.5)
WBC: 6.4 10*3/uL (ref 4.0–10.5)
nRBC: 0 % (ref 0.0–0.2)
nRBC: 0 /100 WBC

## 2021-09-10 LAB — LACTIC ACID, PLASMA: Lactic Acid, Venous: 1.3 mmol/L (ref 0.5–1.9)

## 2021-09-10 LAB — PROTIME-INR
INR: 1.1 (ref 0.8–1.2)
Prothrombin Time: 14.2 seconds (ref 11.4–15.2)

## 2021-09-10 LAB — APTT: aPTT: 39 seconds — ABNORMAL HIGH (ref 24–36)

## 2021-09-10 MED ORDER — IOHEXOL 350 MG/ML SOLN
100.0000 mL | Freq: Once | INTRAVENOUS | Status: AC | PRN
  Administered 2021-09-10: 50 mL via INTRAVENOUS

## 2021-09-10 MED ORDER — IOHEXOL 350 MG/ML SOLN
55.0000 mL | Freq: Once | INTRAVENOUS | Status: AC | PRN
Start: 2021-09-10 — End: 2021-09-10
  Administered 2021-09-10: 55 mL via INTRAVENOUS

## 2021-09-10 MED ORDER — PROCHLORPERAZINE EDISYLATE 10 MG/2ML IJ SOLN
10.0000 mg | Freq: Once | INTRAMUSCULAR | Status: AC
  Administered 2021-09-10: 10 mg via INTRAVENOUS
  Filled 2021-09-10: qty 2

## 2021-09-10 MED ORDER — LACTATED RINGERS IV BOLUS (SEPSIS)
500.0000 mL | Freq: Once | INTRAVENOUS | Status: AC
  Administered 2021-09-10: 500 mL via INTRAVENOUS

## 2021-09-10 MED ORDER — DIPHENHYDRAMINE HCL 50 MG/ML IJ SOLN
25.0000 mg | Freq: Once | INTRAMUSCULAR | Status: AC
  Administered 2021-09-10: 25 mg via INTRAVENOUS
  Filled 2021-09-10: qty 1

## 2021-09-10 NOTE — ED Provider Notes (Signed)
[facility] EMERGENCY DEPARTMENT Provider Note   CSN: [id number] Arrival date & time: 09/10/21  1200     History  Chief Complaint  Patient presents with   Hematemesis   Pneumonia    [name] [name] is a 61 y.o. female.  HPI Patient presenting for hemoptysis, reportedly ongoing despite treatment with antibiotics for pneumonia.  She is in a rehab facility and states that she has had this problem for several months.  Patient thinks that the blood is coming from her feeding tube.  She does not take anything orally.  She states she has only had 1 episode of vomiting.  She describes cough with blood, as well.    Home Medications Prior to Admission medications   Medication Sig Start Date End Date Taking? Authorizing Provider  acetaminophen (TYLENOL) 650 MG suppository Place 1 suppository (650 mg total) rectally every 6 (six) hours as needed for fever or mild pain. 08/23/21   [place], Alvira Philips, DO  aspirin (ASPIRIN CHILDRENS) 81 MG chewable tablet Place 1 tablet (81 mg total) into feeding tube daily. 08/23/21 09/22/21  [place], Alvira Philips, DO  atorvastatin (LIPITOR) 80 MG tablet Place 1 tablet (80 mg total) into feeding tube daily. 08/23/21   [place], Alvira Philips, DO  bisacodyl (DULCOLAX) 10 MG suppository Place 1 suppository (10 mg total) rectally daily as needed for moderate constipation. 08/23/21   [place], Eric J, DO  escitalopram (LEXAPRO) 10 MG tablet Place 1 tablet (10 mg total) into feeding tube at bedtime. 08/23/21   [place], Alvira Philips, DO  Evolocumab with Infusor (REPATHA PUSHTRONEX SYSTEM) 420 MG/3.5ML SOCT Inject 420 mg into the skin every 30 (thirty) days. 04/26/20   Kroeger, Ovidio Kin., [name]  folic acid (FOLVITE) 1 MG tablet Place 1 tablet (1 mg total) into feeding tube daily. 08/24/21   [place], Alvira Philips, DO  furosemide (LASIX) 20 MG tablet Place 1 tablet (20 mg total) into feeding tube daily. 08/27/21   [place], Alvira Philips, DO  gabapentin (NEURONTIN) 250 MG/5ML solution Place 2 mLs (100 mg  total) into feeding tube every 12 (twelve) hours. 08/23/21   [place], Alvira Philips, DO  guaiFENesin (ROBITUSSIN) 100 MG/5ML liquid Place 15 mLs into feeding tube every 6 (six) hours as needed for cough or to loosen phlegm. 08/23/21   [place], Alvira Philips, DO  metoCLOPramide (REGLAN) 10 MG tablet Take 1 tablet (10 mg total) by mouth every 8 (eight) hours as needed for refractory nausea / vomiting. 08/26/21 08/26/22  [place], Eric J, DO  metoprolol tartrate (LOPRESSOR) 25 mg/10 mL SUSP Place 10 mLs (25 mg total) into feeding tube 2 (two) times daily. 08/23/21   [place], Eric J, DO  nitroGLYCERIN (NITROSTAT) 0.4 MG SL tablet DISSOLVE 1 TABLET UNDER THE TONGUE EVERY 5 MINUTES AS  NEEDED FOR CHEST PAIN. MAX  OF 3 TABLETS IN 15 MINUTES. CALL 911 IF PAIN PERSISTS. 06/20/21   Medina-Vargas, Monina C, NP  Nutritional Supplements (FEEDING SUPPLEMENT, JEVITY 1.5 CAL/FIBER,) LIQD Place 237 mLs into feeding tube 5 (five) times daily. 08/23/21   [place], Alvira Philips, DO  Nutritional Supplements (FEEDING SUPPLEMENT, PROSOURCE TF,) liquid Place 45 mLs into feeding tube 2 (two) times daily. 08/23/21   [place], Eric J, DO  ondansetron (ZOFRAN) 4 MG tablet Place 1 tablet (4 mg total) into feeding tube every 8 (eight) hours as needed for nausea or vomiting. 08/26/21 08/26/22  [place], Alvira Philips, DO  ondansetron (ZOFRAN-ODT) 4 MG disintegrating tablet Take 1 tablet (4 mg total) by mouth every  8 (eight) hours as needed. Patient taking differently: Take 4 mg by mouth every 8 (eight) hours as needed for nausea or vomiting. 06/20/21   Medina-Vargas, Monina C, NP  pantoprazole sodium (PROTONIX) 40 mg Place 40 mg into feeding tube daily. 08/23/21   [place], Alvira Philips, DO  phosphorus (K PHOS NEUTRAL) [phone] MG tablet Place 1 tablet (250 mg total) into feeding tube daily. 08/24/21   [place], Alvira Philips, DO  Water For Irrigation, Sterile (FREE WATER) SOLN Place 100 mLs into feeding tube every 6 (six) hours. 08/23/21   [place], Alvira Philips, DO  Water For Irrigation, Sterile (FREE  WATER) SOLN Place 120 mLs into feeding tube 5 (five) times daily. 08/23/21   [place], Eric J, DO      Allergies    Apremilast and Latex    Review of Systems   Review of Systems  Physical Exam Updated Vital Signs BP 119/66   Pulse 95   Temp 98.9 F (37.2 C) (Oral)   Resp 18   SpO2 100%  Physical Exam Vitals and nursing note reviewed.  Constitutional:      Appearance: She is well-developed.     Comments: Appears older than stated age.  Somewhat overweight.  Moderately debilitated.  HENT:     Head: Normocephalic and atraumatic.     Right Ear: External ear normal.     Left Ear: External ear normal.     Nose: No congestion or rhinorrhea.     Mouth/Throat:     Comments: Patient is holding a Yankauer suction tip in her mouth, and there is blood in the suction device and tubing. Eyes:     Conjunctiva/sclera: Conjunctivae normal.     Pupils: Pupils are equal, round, and reactive to light.  Neck:     Trachea: Phonation normal.  Cardiovascular:     Rate and Rhythm: Normal rate and regular rhythm.     Heart sounds: Normal heart sounds.  Pulmonary:     Effort: Pulmonary effort is normal. No respiratory distress.     Breath sounds: Normal breath sounds. No stridor.  Abdominal:     General: There is no distension.     Palpations: Abdomen is soft.     Tenderness: There is no abdominal tenderness. There is no guarding.     Comments: Feeding tube, left upper quadrant, site and appliance appear normal.  Musculoskeletal:        General: Normal range of motion.     Cervical back: Normal range of motion and neck supple.  Skin:    General: Skin is warm and dry.  Neurological:     Mental Status: She is alert and oriented to person, place, and time.     Cranial Nerves: No cranial nerve deficit.     Sensory: No sensory deficit.     Motor: No abnormal muscle tone.     Coordination: Coordination normal.  Psychiatric:        Mood and Affect: Mood normal.        Behavior: Behavior normal.      ED Results / Procedures / Treatments   Labs (all labs ordered are listed, but only abnormal results are displayed) Labs Reviewed  COMPREHENSIVE METABOLIC PANEL - Abnormal; Notable for the following components:      Result Value   Total Protein 8.3 (*)    Albumin 1.9 (*)    AST 58 (*)    All other components within normal limits  CBC WITH DIFFERENTIAL/PLATELET - Abnormal; Notable for the  following components:   RBC 3.72 (*)    Hemoglobin 10.8 (*)    HCT 34.9 (*)    RDW 16.7 (*)    Lymphs Abs 0.3 (*)    All other components within normal limits  APTT - Abnormal; Notable for the following components:   aPTT 39 (*)    All other components within normal limits  CULTURE, BLOOD (ROUTINE X 2)  CULTURE, BLOOD (ROUTINE X 2)  LACTIC ACID, PLASMA  PROTIME-INR  LACTIC ACID, PLASMA    EKG EKG Interpretation  Date/Time:  Tuesday September 10 2021 13:32:00 EDT Ventricular Rate:  106 PR Interval:  137 QRS Duration: 97 QT Interval:  366 QTC Calculation: 486 R Axis:   56 Text Interpretation: Sinus tachycardia Ventricular premature complex Inferior infarct, old since last tracing no significant change Confirmed by Mancel Bale [phone]) on 09/10/2021 1:41:34 PM  Radiology DG Chest Port 1 View  Result Date: 09/10/2021 CLINICAL DATA:  Questionable sepsis. Currently with pneumonia and hematemesis. EXAM: PORTABLE CHEST 1 VIEW COMPARISON:  Chest x-rays dated 08/24/2021 and 08/23/2021. Chest CT dated 08/24/2021. FINDINGS: Heart size and mediastinal contours are stable. Median sternotomy wires appear grossly intact and stable in alignment. Coarse interstitial markings are again seen throughout both lungs. Small residual RIGHT pleural effusion. Probable mild atelectasis at the LEFT lung base. IMPRESSION: 1. No evidence of pneumonia or pulmonary edema. 2. Small residual RIGHT pleural effusion. 3. Probable mild atelectasis at the LEFT lung base. Electronically Signed   By: Bary Richard M.D.   On:  09/10/2021 13:09    Procedures Procedures    Medications Ordered in ED Medications  lactated ringers bolus 500 mL (0 mLs Intravenous Stopped 09/10/21 1626)    ED Course/ Medical Decision Making/ A&P Clinical Course as of 09/10/21 1642  Tue Sep 10, 2021  1638 CT PE and dc [MK]    Clinical Course User Index [MK] Kommor, Madison, MD                           Medical Decision Making Patient resides at a skilled nursing facility where she is being treated for pneumonia.  She apparently has ongoing hemoptysis since was sent here for further evaluation on arrival she is hemodynamically stable.  Amount and/or Complexity of Data Reviewed Independent Historian:     Details: She is a cogent historian External Data Reviewed: labs, radiology and notes.    Details: Treatment notes from skilled nursing facility Labs: ordered.    Details: CBC, metabolic panel, INR, PTT, lactate, blood culture-normal except hemoglobin low, albumin low, AST low, total protein high Radiology: ordered and independent interpretation performed.    Details: Chest x-ray -- right pleural effusion, chronic, atelectasis left base.  CT chest, angio to evaluate for PE and specify lung pathology -- ECG/medicine tests: ordered and independent interpretation performed.    Details: Cardiac monitor -- normal sinus rhythm  Risk Decision regarding hospitalization. Risk Details: Patient presenting with ongoing hemoptysis while being treated for pneumonia.  She resides in a nursing care facility.  She is hemodynamically stable on arrival.  She does not take oral anticoagulants.  Disposition relative to treatment and/or hospitalization will be made following return of CT angiogram to evaluate for PE or other acute intrathoracic/pulmonary disorders.Care to Dr. Audrie Lia.           Final Clinical Impression(s) / ED Diagnoses Final diagnoses:  Hemoptysis    Rx / DC Orders ED Discharge Orders  None         Mancel Bale, MD 09/10/21 [phone]

## 2021-09-10 NOTE — ED Provider Notes (Signed)
  Physical Exam  BP 124/71   Pulse 98   Temp 98.9 F (37.2 C) (Oral)   Resp 16   SpO2 99%   Physical Exam Vitals and nursing note reviewed.  Constitutional:      General: She is not in acute distress.    Appearance: She is well-developed.  HENT:     Head: Normocephalic and atraumatic.     Comments: Dry oral mucosa likely secondary to chronic oxygen use, scant  Hemoptysis Eyes:     Conjunctiva/sclera: Conjunctivae normal.  Cardiovascular:     Rate and Rhythm: Normal rate and regular rhythm.     Heart sounds: No murmur heard. Pulmonary:     Effort: Pulmonary effort is normal. No respiratory distress.     Breath sounds: Normal breath sounds.  Abdominal:     Palpations: Abdomen is soft.     Tenderness: There is no abdominal tenderness.  Musculoskeletal:        General: No swelling.     Cervical back: Neck supple.  Skin:    General: Skin is warm and dry.     Capillary Refill: Capillary refill takes less than 2 seconds.  Neurological:     Mental Status: She is alert.  Psychiatric:        Mood and Affect: Mood normal.     Procedures  Procedures  ED Course / MDM   Clinical Course as of 09/10/21 2201  Tue Sep 10, 2021  1638 CT PE and dc [MK]    Clinical Course User Index [MK] Maybree Riling, Wyn Forster, MD   Medical Decision Making Amount and/or Complexity of Data Reviewed Labs: ordered. Radiology: ordered. ECG/medicine tests: ordered.  Risk Prescription drug management.  Patient received in handoff.  Patient presenting with hemoptysis.  Currently in nursing facility on daily Augmentin for known pneumonia after completing courses of meropenem.  Hematemesis has been present throughout her previous hospital stay and in the nursing home.  At time of signout, CT PE pending.  CT PE with no pulmonary embolism, improving pleural effusion and persistent pneumonia versus aspiration.  She is already on antibiotics for this and is currently hemodynamically stable.  She does not meet  inpatient criteria for admission at this time and is safe for discharge back to her healthcare facility.       Glendora Score, MD 09/10/21 2206

## 2021-09-15 LAB — CULTURE, BLOOD (ROUTINE X 2)
Culture: NO GROWTH
Culture: NO GROWTH

## 2021-09-16 DIAGNOSIS — L89150 Pressure ulcer of sacral region, unstageable: Secondary | ICD-10-CM | POA: Diagnosis not present

## 2021-09-16 DIAGNOSIS — L89310 Pressure ulcer of right buttock, unstageable: Secondary | ICD-10-CM | POA: Diagnosis not present

## 2021-09-18 DIAGNOSIS — J690 Pneumonitis due to inhalation of food and vomit: Secondary | ICD-10-CM | POA: Diagnosis not present

## 2021-09-18 DIAGNOSIS — E440 Moderate protein-calorie malnutrition: Secondary | ICD-10-CM | POA: Diagnosis not present

## 2021-09-18 DIAGNOSIS — M069 Rheumatoid arthritis, unspecified: Secondary | ICD-10-CM | POA: Diagnosis not present

## 2021-09-18 DIAGNOSIS — G629 Polyneuropathy, unspecified: Secondary | ICD-10-CM | POA: Diagnosis not present

## 2021-09-18 DIAGNOSIS — M6281 Muscle weakness (generalized): Secondary | ICD-10-CM | POA: Diagnosis not present

## 2021-09-18 DIAGNOSIS — M6259 Muscle wasting and atrophy, not elsewhere classified, multiple sites: Secondary | ICD-10-CM | POA: Diagnosis not present

## 2021-09-18 DIAGNOSIS — E785 Hyperlipidemia, unspecified: Secondary | ICD-10-CM | POA: Diagnosis not present

## 2021-09-18 DIAGNOSIS — E118 Type 2 diabetes mellitus with unspecified complications: Secondary | ICD-10-CM | POA: Diagnosis not present

## 2021-09-18 DIAGNOSIS — I69391 Dysphagia following cerebral infarction: Secondary | ICD-10-CM | POA: Diagnosis not present

## 2021-09-19 ENCOUNTER — Other Ambulatory Visit: Payer: Self-pay

## 2021-09-19 ENCOUNTER — Emergency Department (HOSPITAL_COMMUNITY): Payer: Medicare HMO

## 2021-09-19 ENCOUNTER — Encounter (HOSPITAL_COMMUNITY): Payer: Self-pay | Admitting: Internal Medicine

## 2021-09-19 ENCOUNTER — Inpatient Hospital Stay (HOSPITAL_COMMUNITY)
Admission: EM | Admit: 2021-09-19 | Discharge: 2021-10-15 | DRG: 871 | Disposition: E | Payer: Medicare HMO | Attending: Family Medicine | Admitting: Family Medicine

## 2021-09-19 DIAGNOSIS — A419 Sepsis, unspecified organism: Secondary | ICD-10-CM

## 2021-09-19 DIAGNOSIS — L89611 Pressure ulcer of right heel, stage 1: Secondary | ICD-10-CM | POA: Diagnosis present

## 2021-09-19 DIAGNOSIS — I5042 Chronic combined systolic (congestive) and diastolic (congestive) heart failure: Secondary | ICD-10-CM | POA: Diagnosis not present

## 2021-09-19 DIAGNOSIS — I248 Other forms of acute ischemic heart disease: Secondary | ICD-10-CM | POA: Diagnosis present

## 2021-09-19 DIAGNOSIS — I160 Hypertensive urgency: Secondary | ICD-10-CM | POA: Diagnosis not present

## 2021-09-19 DIAGNOSIS — R6520 Severe sepsis without septic shock: Secondary | ICD-10-CM | POA: Diagnosis present

## 2021-09-19 DIAGNOSIS — E119 Type 2 diabetes mellitus without complications: Secondary | ICD-10-CM | POA: Diagnosis present

## 2021-09-19 DIAGNOSIS — G9341 Metabolic encephalopathy: Secondary | ICD-10-CM | POA: Diagnosis not present

## 2021-09-19 DIAGNOSIS — E860 Dehydration: Secondary | ICD-10-CM | POA: Diagnosis present

## 2021-09-19 DIAGNOSIS — J9622 Acute and chronic respiratory failure with hypercapnia: Secondary | ICD-10-CM | POA: Diagnosis not present

## 2021-09-19 DIAGNOSIS — Z823 Family history of stroke: Secondary | ICD-10-CM

## 2021-09-19 DIAGNOSIS — J90 Pleural effusion, not elsewhere classified: Secondary | ICD-10-CM | POA: Diagnosis not present

## 2021-09-19 DIAGNOSIS — L8990 Pressure ulcer of unspecified site, unspecified stage: Secondary | ICD-10-CM | POA: Diagnosis present

## 2021-09-19 DIAGNOSIS — I25810 Atherosclerosis of coronary artery bypass graft(s) without angina pectoris: Secondary | ICD-10-CM | POA: Diagnosis present

## 2021-09-19 DIAGNOSIS — Z7189 Other specified counseling: Secondary | ICD-10-CM | POA: Diagnosis not present

## 2021-09-19 DIAGNOSIS — R079 Chest pain, unspecified: Secondary | ICD-10-CM | POA: Diagnosis not present

## 2021-09-19 DIAGNOSIS — Z87891 Personal history of nicotine dependence: Secondary | ICD-10-CM

## 2021-09-19 DIAGNOSIS — A4102 Sepsis due to Methicillin resistant Staphylococcus aureus: Principal | ICD-10-CM | POA: Diagnosis present

## 2021-09-19 DIAGNOSIS — R1310 Dysphagia, unspecified: Secondary | ICD-10-CM

## 2021-09-19 DIAGNOSIS — R0902 Hypoxemia: Secondary | ICD-10-CM | POA: Diagnosis not present

## 2021-09-19 DIAGNOSIS — E1129 Type 2 diabetes mellitus with other diabetic kidney complication: Secondary | ICD-10-CM | POA: Diagnosis not present

## 2021-09-19 DIAGNOSIS — R627 Adult failure to thrive: Secondary | ICD-10-CM | POA: Diagnosis present

## 2021-09-19 DIAGNOSIS — Z794 Long term (current) use of insulin: Secondary | ICD-10-CM

## 2021-09-19 DIAGNOSIS — Z8701 Personal history of pneumonia (recurrent): Secondary | ICD-10-CM

## 2021-09-19 DIAGNOSIS — Z4682 Encounter for fitting and adjustment of non-vascular catheter: Secondary | ICD-10-CM | POA: Diagnosis not present

## 2021-09-19 DIAGNOSIS — E785 Hyperlipidemia, unspecified: Secondary | ICD-10-CM | POA: Diagnosis not present

## 2021-09-19 DIAGNOSIS — Z66 Do not resuscitate: Secondary | ICD-10-CM | POA: Diagnosis not present

## 2021-09-19 DIAGNOSIS — F32A Depression, unspecified: Secondary | ICD-10-CM | POA: Diagnosis present

## 2021-09-19 DIAGNOSIS — L89152 Pressure ulcer of sacral region, stage 2: Secondary | ICD-10-CM | POA: Diagnosis present

## 2021-09-19 DIAGNOSIS — Z515 Encounter for palliative care: Secondary | ICD-10-CM

## 2021-09-19 DIAGNOSIS — J9621 Acute and chronic respiratory failure with hypoxia: Secondary | ICD-10-CM | POA: Diagnosis present

## 2021-09-19 DIAGNOSIS — J690 Pneumonitis due to inhalation of food and vomit: Secondary | ICD-10-CM | POA: Diagnosis present

## 2021-09-19 DIAGNOSIS — R52 Pain, unspecified: Secondary | ICD-10-CM | POA: Diagnosis not present

## 2021-09-19 DIAGNOSIS — L89322 Pressure ulcer of left buttock, stage 2: Secondary | ICD-10-CM | POA: Diagnosis present

## 2021-09-19 DIAGNOSIS — B9562 Methicillin resistant Staphylococcus aureus infection as the cause of diseases classified elsewhere: Secondary | ICD-10-CM | POA: Diagnosis not present

## 2021-09-19 DIAGNOSIS — R0789 Other chest pain: Secondary | ICD-10-CM | POA: Diagnosis not present

## 2021-09-19 DIAGNOSIS — E870 Hyperosmolality and hypernatremia: Secondary | ICD-10-CM | POA: Diagnosis present

## 2021-09-19 DIAGNOSIS — R7881 Bacteremia: Secondary | ICD-10-CM | POA: Diagnosis not present

## 2021-09-19 DIAGNOSIS — R000 Tachycardia, unspecified: Secondary | ICD-10-CM | POA: Diagnosis not present

## 2021-09-19 DIAGNOSIS — R54 Age-related physical debility: Secondary | ICD-10-CM | POA: Diagnosis present

## 2021-09-19 DIAGNOSIS — M329 Systemic lupus erythematosus, unspecified: Secondary | ICD-10-CM | POA: Diagnosis present

## 2021-09-19 DIAGNOSIS — K224 Dyskinesia of esophagus: Secondary | ICD-10-CM | POA: Diagnosis present

## 2021-09-19 DIAGNOSIS — E440 Moderate protein-calorie malnutrition: Secondary | ICD-10-CM | POA: Diagnosis present

## 2021-09-19 DIAGNOSIS — L89312 Pressure ulcer of right buttock, stage 2: Secondary | ICD-10-CM | POA: Diagnosis present

## 2021-09-19 DIAGNOSIS — N179 Acute kidney failure, unspecified: Secondary | ICD-10-CM | POA: Diagnosis not present

## 2021-09-19 DIAGNOSIS — Z833 Family history of diabetes mellitus: Secondary | ICD-10-CM

## 2021-09-19 DIAGNOSIS — Z8673 Personal history of transient ischemic attack (TIA), and cerebral infarction without residual deficits: Secondary | ICD-10-CM

## 2021-09-19 DIAGNOSIS — G4733 Obstructive sleep apnea (adult) (pediatric): Secondary | ICD-10-CM | POA: Diagnosis present

## 2021-09-19 DIAGNOSIS — J9601 Acute respiratory failure with hypoxia: Secondary | ICD-10-CM | POA: Diagnosis not present

## 2021-09-19 DIAGNOSIS — I10 Essential (primary) hypertension: Secondary | ICD-10-CM | POA: Diagnosis present

## 2021-09-19 DIAGNOSIS — I2510 Atherosclerotic heart disease of native coronary artery without angina pectoris: Secondary | ICD-10-CM | POA: Diagnosis not present

## 2021-09-19 DIAGNOSIS — E11649 Type 2 diabetes mellitus with hypoglycemia without coma: Secondary | ICD-10-CM | POA: Diagnosis present

## 2021-09-19 DIAGNOSIS — Z9861 Coronary angioplasty status: Secondary | ICD-10-CM

## 2021-09-19 DIAGNOSIS — E876 Hypokalemia: Secondary | ICD-10-CM | POA: Diagnosis present

## 2021-09-19 DIAGNOSIS — F419 Anxiety disorder, unspecified: Secondary | ICD-10-CM | POA: Diagnosis present

## 2021-09-19 DIAGNOSIS — J189 Pneumonia, unspecified organism: Secondary | ICD-10-CM | POA: Diagnosis not present

## 2021-09-19 DIAGNOSIS — J9691 Respiratory failure, unspecified with hypoxia: Secondary | ICD-10-CM | POA: Diagnosis present

## 2021-09-19 DIAGNOSIS — Z7982 Long term (current) use of aspirin: Secondary | ICD-10-CM

## 2021-09-19 DIAGNOSIS — Z79899 Other long term (current) drug therapy: Secondary | ICD-10-CM

## 2021-09-19 DIAGNOSIS — E861 Hypovolemia: Secondary | ICD-10-CM | POA: Diagnosis present

## 2021-09-19 DIAGNOSIS — L4050 Arthropathic psoriasis, unspecified: Secondary | ICD-10-CM | POA: Diagnosis present

## 2021-09-19 DIAGNOSIS — L8992 Pressure ulcer of unspecified site, stage 2: Secondary | ICD-10-CM | POA: Diagnosis not present

## 2021-09-19 DIAGNOSIS — L309 Dermatitis, unspecified: Secondary | ICD-10-CM | POA: Diagnosis present

## 2021-09-19 DIAGNOSIS — R197 Diarrhea, unspecified: Secondary | ICD-10-CM | POA: Diagnosis present

## 2021-09-19 DIAGNOSIS — K651 Peritoneal abscess: Secondary | ICD-10-CM | POA: Diagnosis not present

## 2021-09-19 DIAGNOSIS — Z6824 Body mass index (BMI) 24.0-24.9, adult: Secondary | ICD-10-CM

## 2021-09-19 DIAGNOSIS — I110 Hypertensive heart disease with heart failure: Secondary | ICD-10-CM | POA: Diagnosis present

## 2021-09-19 DIAGNOSIS — M069 Rheumatoid arthritis, unspecified: Secondary | ICD-10-CM | POA: Diagnosis present

## 2021-09-19 DIAGNOSIS — E871 Hypo-osmolality and hyponatremia: Secondary | ICD-10-CM | POA: Diagnosis not present

## 2021-09-19 DIAGNOSIS — Z931 Gastrostomy status: Secondary | ICD-10-CM

## 2021-09-19 DIAGNOSIS — I252 Old myocardial infarction: Secondary | ICD-10-CM

## 2021-09-19 DIAGNOSIS — Z951 Presence of aortocoronary bypass graft: Secondary | ICD-10-CM | POA: Diagnosis not present

## 2021-09-19 DIAGNOSIS — Z818 Family history of other mental and behavioral disorders: Secondary | ICD-10-CM

## 2021-09-19 DIAGNOSIS — Z888 Allergy status to other drugs, medicaments and biological substances status: Secondary | ICD-10-CM

## 2021-09-19 DIAGNOSIS — Z7401 Bed confinement status: Secondary | ICD-10-CM

## 2021-09-19 DIAGNOSIS — K219 Gastro-esophageal reflux disease without esophagitis: Secondary | ICD-10-CM | POA: Diagnosis present

## 2021-09-19 DIAGNOSIS — Z8249 Family history of ischemic heart disease and other diseases of the circulatory system: Secondary | ICD-10-CM

## 2021-09-19 DIAGNOSIS — L89626 Pressure-induced deep tissue damage of left heel: Secondary | ICD-10-CM | POA: Diagnosis present

## 2021-09-19 DIAGNOSIS — R1312 Dysphagia, oropharyngeal phase: Secondary | ICD-10-CM | POA: Diagnosis present

## 2021-09-19 LAB — I-STAT VENOUS BLOOD GAS, ED
Acid-Base Excess: 7 mmol/L — ABNORMAL HIGH (ref 0.0–2.0)
Bicarbonate: 32.7 mmol/L — ABNORMAL HIGH (ref 20.0–28.0)
Calcium, Ion: 1.14 mmol/L — ABNORMAL LOW (ref 1.15–1.40)
HCT: 29 % — ABNORMAL LOW (ref 36.0–46.0)
Hemoglobin: 9.9 g/dL — ABNORMAL LOW (ref 12.0–15.0)
O2 Saturation: 85 %
Potassium: 4 mmol/L (ref 3.5–5.1)
Sodium: 156 mmol/L — ABNORMAL HIGH (ref 135–145)
TCO2: 34 mmol/L — ABNORMAL HIGH (ref 22–32)
pCO2, Ven: 51.2 mmHg (ref 44–60)
pH, Ven: 7.414 (ref 7.25–7.43)
pO2, Ven: 51 mmHg — ABNORMAL HIGH (ref 32–45)

## 2021-09-19 LAB — COMPREHENSIVE METABOLIC PANEL
ALT: 24 U/L (ref 0–44)
AST: 77 U/L — ABNORMAL HIGH (ref 15–41)
Albumin: 1.8 g/dL — ABNORMAL LOW (ref 3.5–5.0)
Alkaline Phosphatase: 108 U/L (ref 38–126)
Anion gap: 8 (ref 5–15)
BUN: 49 mg/dL — ABNORMAL HIGH (ref 8–23)
CO2: 35 mmol/L — ABNORMAL HIGH (ref 22–32)
Calcium: 9.3 mg/dL (ref 8.9–10.3)
Chloride: 111 mmol/L (ref 98–111)
Creatinine, Ser: 1.3 mg/dL — ABNORMAL HIGH (ref 0.44–1.00)
GFR, Estimated: 46 mL/min — ABNORMAL LOW (ref >60)
Glucose, Bld: 132 mg/dL — ABNORMAL HIGH (ref 70–99)
Potassium: 4.6 mmol/L (ref 3.5–5.1)
Sodium: 154 mmol/L — ABNORMAL HIGH (ref 135–145)
Total Bilirubin: 0.6 mg/dL (ref 0.3–1.2)
Total Protein: 9.8 g/dL — ABNORMAL HIGH (ref 6.5–8.1)

## 2021-09-19 LAB — URINALYSIS, MICROSCOPIC (REFLEX)

## 2021-09-19 LAB — BASIC METABOLIC PANEL
Anion gap: 7 (ref 5–15)
BUN: 47 mg/dL — ABNORMAL HIGH (ref 8–23)
CO2: 30 mmol/L (ref 22–32)
Calcium: 8.1 mg/dL — ABNORMAL LOW (ref 8.9–10.3)
Chloride: 115 mmol/L — ABNORMAL HIGH (ref 98–111)
Creatinine, Ser: 1.25 mg/dL — ABNORMAL HIGH (ref 0.44–1.00)
GFR, Estimated: 49 mL/min — ABNORMAL LOW (ref >60)
Glucose, Bld: 103 mg/dL — ABNORMAL HIGH (ref 70–99)
Potassium: 4.5 mmol/L (ref 3.5–5.1)
Sodium: 152 mmol/L — ABNORMAL HIGH (ref 135–145)

## 2021-09-19 LAB — PROTIME-INR
INR: 1.2 (ref 0.8–1.2)
Prothrombin Time: 14.7 seconds (ref 11.4–15.2)

## 2021-09-19 LAB — CBC WITH DIFFERENTIAL/PLATELET
Abs Immature Granulocytes: 0 10*3/uL (ref 0.00–0.07)
Basophils Absolute: 0 10*3/uL (ref 0.0–0.1)
Basophils Relative: 0 %
Eosinophils Absolute: 0 10*3/uL (ref 0.0–0.5)
Eosinophils Relative: 0 %
HCT: 50.2 % — ABNORMAL HIGH (ref 36.0–46.0)
Hemoglobin: 14.4 g/dL (ref 12.0–15.0)
Lymphocytes Relative: 7 %
Lymphs Abs: 0.4 10*3/uL — ABNORMAL LOW (ref 0.7–4.0)
MCH: 28.2 pg (ref 26.0–34.0)
MCHC: 28.7 g/dL — ABNORMAL LOW (ref 30.0–36.0)
MCV: 98.4 fL (ref 80.0–100.0)
Monocytes Absolute: 0.2 10*3/uL (ref 0.1–1.0)
Monocytes Relative: 3 %
Neutro Abs: 5.7 10*3/uL (ref 1.7–7.7)
Neutrophils Relative %: 90 %
Platelets: 204 10*3/uL (ref 150–400)
RBC: 5.1 MIL/uL (ref 3.87–5.11)
RDW: 17.2 % — ABNORMAL HIGH (ref 11.5–15.5)
WBC: 6.3 10*3/uL (ref 4.0–10.5)
nRBC: 0.3 % — ABNORMAL HIGH (ref 0.0–0.2)
nRBC: 1 /100 WBC — ABNORMAL HIGH

## 2021-09-19 LAB — LACTIC ACID, PLASMA
Lactic Acid, Venous: 3.5 mmol/L (ref 0.5–1.9)
Lactic Acid, Venous: 2.2 mmol/L (ref 0.5–1.9)
Lactic Acid, Venous: 5 mmol/L (ref 0.5–1.9)

## 2021-09-19 LAB — TROPONIN I (HIGH SENSITIVITY)
Troponin I (High Sensitivity): 98 ng/L — ABNORMAL HIGH (ref <18)
Troponin I (High Sensitivity): 93 ng/L — ABNORMAL HIGH (ref <18)
Troponin I (High Sensitivity): 111 ng/L (ref <18)

## 2021-09-19 LAB — URINALYSIS, ROUTINE W REFLEX MICROSCOPIC
Bilirubin Urine: NEGATIVE
Glucose, UA: NEGATIVE mg/dL
Ketones, ur: NEGATIVE mg/dL
Leukocytes,Ua: NEGATIVE
Nitrite: NEGATIVE
Protein, ur: >300 mg/dL — AB
Specific Gravity, Urine: 1.025 (ref 1.005–1.030)
pH: 5.5 (ref 5.0–8.0)

## 2021-09-19 LAB — APTT: aPTT: 40 seconds — ABNORMAL HIGH (ref 24–36)

## 2021-09-19 LAB — GLUCOSE, CAPILLARY
Glucose-Capillary: 93 mg/dL (ref 70–99)
Glucose-Capillary: 69 mg/dL — ABNORMAL LOW (ref 70–99)
Glucose-Capillary: 75 mg/dL (ref 70–99)
Glucose-Capillary: 95 mg/dL (ref 70–99)

## 2021-09-19 LAB — PROCALCITONIN: Procalcitonin: 0.47 ng/mL

## 2021-09-19 LAB — CBG MONITORING, ED
Glucose-Capillary: 63 mg/dL — ABNORMAL LOW (ref 70–99)
Glucose-Capillary: 99 mg/dL (ref 70–99)

## 2021-09-19 LAB — BRAIN NATRIURETIC PEPTIDE: B Natriuretic Peptide: 187.9 pg/mL — ABNORMAL HIGH (ref 0.0–100.0)

## 2021-09-19 MED ORDER — BISACODYL 10 MG RE SUPP: 10.0000 mg | Freq: Every day | RECTAL | Status: DC | PRN

## 2021-09-19 MED ORDER — SODIUM CHLORIDE 0.9% FLUSH
3.0000 mL | Freq: Two times a day (BID) | INTRAVENOUS | Status: DC
  Administered 2021-09-19 – 2021-09-22 (×3): 3 mL via INTRAVENOUS

## 2021-09-19 MED ORDER — SODIUM CHLORIDE 0.9 % IV SOLN
2.0000 g | Freq: Two times a day (BID) | INTRAVENOUS | Status: DC
  Administered 2021-09-19: 2 g via INTRAVENOUS
  Filled 2021-09-19: qty 12.5

## 2021-09-19 MED ORDER — PROSOURCE TF PO LIQD
45.0000 mL | Freq: Two times a day (BID) | ORAL | Status: DC
  Administered 2021-09-19 – 2021-09-20 (×2): 45 mL
  Filled 2021-09-19 (×2): qty 45

## 2021-09-19 MED ORDER — DEXTROSE 50 % IV SOLN
INTRAVENOUS | Status: AC
  Administered 2021-09-19: 50 mL
  Filled 2021-09-19: qty 50

## 2021-09-19 MED ORDER — ONDANSETRON HCL 4 MG/2ML IJ SOLN: 4.0000 mg | Freq: Four times a day (QID) | INTRAMUSCULAR | Status: DC | PRN

## 2021-09-19 MED ORDER — PANTOPRAZOLE 2 MG/ML SUSPENSION
40.0000 mg | Freq: Every day | ORAL | Status: DC
  Administered 2021-09-20 – 2021-09-21 (×2): 40 mg
  Filled 2021-09-19 (×5): qty 20

## 2021-09-19 MED ORDER — INSULIN ASPART 100 UNIT/ML IJ SOLN: 0.0000 [IU] | Freq: Three times a day (TID) | INTRAMUSCULAR | Status: DC

## 2021-09-19 MED ORDER — HYDRALAZINE HCL 20 MG/ML IJ SOLN: 5.0000 mg | INTRAMUSCULAR | Status: DC | PRN

## 2021-09-19 MED ORDER — BISACODYL 5 MG PO TBEC: 5.0000 mg | DELAYED_RELEASE_TABLET | Freq: Every day | ORAL | Status: DC | PRN

## 2021-09-19 MED ORDER — SODIUM CHLORIDE 0.9 % IV SOLN
2.0000 g | Freq: Once | INTRAVENOUS | Status: AC
  Administered 2021-09-19: 2 g via INTRAVENOUS
  Filled 2021-09-19: qty 12.5

## 2021-09-19 MED ORDER — ONDANSETRON HCL 4 MG PO TABS: 4.0000 mg | ORAL_TABLET | Freq: Four times a day (QID) | ORAL | Status: DC | PRN

## 2021-09-19 MED ORDER — VITAL HIGH PROTEIN PO LIQD
1000.0000 mL | ORAL | Status: DC
  Administered 2021-09-19: 1000 mL
  Filled 2021-09-19: qty 1000

## 2021-09-19 MED ORDER — GUAIFENESIN ER 600 MG PO TB12: 600.0000 mg | ORAL_TABLET | Freq: Two times a day (BID) | ORAL | Status: DC | PRN

## 2021-09-19 MED ORDER — FREE WATER
200.0000 mL | Status: DC
  Administered 2021-09-19 (×3): 200 mL

## 2021-09-19 MED ORDER — LACTATED RINGERS IV BOLUS (SEPSIS)
1000.0000 mL | Freq: Once | INTRAVENOUS | Status: AC
  Administered 2021-09-19: 1000 mL via INTRAVENOUS

## 2021-09-19 MED ORDER — CHLORHEXIDINE GLUCONATE CLOTH 2 % EX PADS
6.0000 | MEDICATED_PAD | Freq: Every day | CUTANEOUS | Status: DC
  Administered 2021-09-19 – 2021-09-22 (×4): 6 via TOPICAL

## 2021-09-19 MED ORDER — METOCLOPRAMIDE HCL 10 MG PO TABS: 10.0000 mg | ORAL_TABLET | Freq: Three times a day (TID) | ORAL | Status: DC | PRN

## 2021-09-19 MED ORDER — WHITE PETROLATUM EX OINT: TOPICAL_OINTMENT | CUTANEOUS | Status: DC | PRN

## 2021-09-19 MED ORDER — ACETAMINOPHEN 325 MG PO TABS
650.0000 mg | ORAL_TABLET | Freq: Four times a day (QID) | ORAL | Status: DC | PRN
  Administered 2021-09-19 – 2021-09-21 (×5): 650 mg
  Filled 2021-09-19 (×5): qty 2

## 2021-09-19 MED ORDER — FOLIC ACID 1 MG PO TABS
1.0000 mg | ORAL_TABLET | Freq: Every day | ORAL | Status: DC
Start: 2021-09-19 — End: 2021-09-22
  Administered 2021-09-19 – 2021-09-22 (×4): 1 mg
  Filled 2021-09-19 (×4): qty 1

## 2021-09-19 MED ORDER — GUAIFENESIN 100 MG/5ML PO LIQD
15.0000 mL | Freq: Four times a day (QID) | ORAL | Status: DC | PRN
Start: 2021-09-19 — End: 2021-09-23
  Administered 2021-09-19: 15 mL
  Filled 2021-09-19: qty 15

## 2021-09-19 MED ORDER — ORAL CARE MOUTH RINSE: 15.0000 mL | OROMUCOSAL | Status: DC | PRN

## 2021-09-19 MED ORDER — CEFEPIME HCL 2 G IV SOLR: 2.0000 g | Freq: Three times a day (TID) | INTRAVENOUS | Status: DC

## 2021-09-19 MED ORDER — METRONIDAZOLE 500 MG/100ML IV SOLN
500.0000 mg | Freq: Once | INTRAVENOUS | Status: AC
  Administered 2021-09-19: 500 mg via INTRAVENOUS
  Filled 2021-09-19: qty 100

## 2021-09-19 MED ORDER — ENOXAPARIN SODIUM 40 MG/0.4ML IJ SOSY
40.0000 mg | PREFILLED_SYRINGE | Freq: Every day | INTRAMUSCULAR | Status: DC
  Administered 2021-09-19 – 2021-09-22 (×4): 40 mg via SUBCUTANEOUS
  Filled 2021-09-19 (×4): qty 0.4

## 2021-09-19 MED ORDER — METRONIDAZOLE 500 MG/100ML IV SOLN
500.0000 mg | Freq: Two times a day (BID) | INTRAVENOUS | Status: DC
Start: 2021-09-19 — End: 2021-09-19

## 2021-09-19 MED ORDER — ASPIRIN 81 MG PO CHEW
81.0000 mg | CHEWABLE_TABLET | Freq: Every day | ORAL | Status: DC
Start: 2021-09-19 — End: 2021-09-22
  Administered 2021-09-19 – 2021-09-22 (×4): 81 mg
  Filled 2021-09-19 (×4): qty 1

## 2021-09-19 MED ORDER — POLYETHYLENE GLYCOL 3350 17 G PO PACK: 17.0000 g | PACK | Freq: Every day | ORAL | Status: DC | PRN

## 2021-09-19 MED ORDER — ESCITALOPRAM OXALATE 10 MG PO TABS
10.0000 mg | ORAL_TABLET | Freq: Every day | ORAL | Status: DC
Start: 2021-09-19 — End: 2021-09-22
  Administered 2021-09-19 – 2021-09-21 (×3): 10 mg
  Filled 2021-09-19 (×3): qty 1

## 2021-09-19 MED ORDER — LACTATED RINGERS IV SOLN: INTRAVENOUS | Status: DC

## 2021-09-19 MED ORDER — ACETAMINOPHEN 650 MG RE SUPP
650.0000 mg | Freq: Four times a day (QID) | RECTAL | Status: DC | PRN
  Filled 2021-09-19: qty 1

## 2021-09-19 MED ORDER — IPRATROPIUM-ALBUTEROL 0.5-2.5 (3) MG/3ML IN SOLN
3.0000 mL | Freq: Once | RESPIRATORY_TRACT | Status: DC
  Filled 2021-09-19: qty 3

## 2021-09-19 MED ORDER — ACETAMINOPHEN 325 MG PO TABS
650.0000 mg | ORAL_TABLET | Freq: Four times a day (QID) | ORAL | Status: DC | PRN
Start: 2021-09-19 — End: 2021-09-19
  Filled 2021-09-19: qty 2

## 2021-09-19 MED ORDER — MORPHINE SULFATE (PF) 2 MG/ML IV SOLN: 2.0000 mg | INTRAVENOUS | Status: DC | PRN

## 2021-09-19 MED ORDER — ORAL CARE MOUTH RINSE
15.0000 mL | OROMUCOSAL | Status: DC
  Administered 2021-09-19 – 2021-09-23 (×14): 15 mL via OROMUCOSAL

## 2021-09-19 MED ORDER — K PHOS MONO-SOD PHOS DI & MONO 155-852-130 MG PO TABS
250.0000 mg | ORAL_TABLET | Freq: Every day | ORAL | Status: DC
  Administered 2021-09-20 – 2021-09-22 (×3): 250 mg
  Filled 2021-09-19 (×4): qty 1

## 2021-09-19 MED ORDER — VANCOMYCIN HCL 1500 MG/300ML IV SOLN
1500.0000 mg | Freq: Once | INTRAVENOUS | Status: AC
  Administered 2021-09-19: 1500 mg via INTRAVENOUS
  Filled 2021-09-19: qty 300

## 2021-09-19 MED ORDER — SODIUM CHLORIDE 0.9 % IV SOLN: INTRAVENOUS | Status: DC

## 2021-09-19 MED ORDER — ACETAMINOPHEN 650 MG RE SUPP
650.0000 mg | Freq: Once | RECTAL | Status: AC
  Administered 2021-09-19: 650 mg via RECTAL
  Filled 2021-09-19: qty 1

## 2021-09-19 MED ORDER — ACETAMINOPHEN 650 MG RE SUPP: 650.0000 mg | Freq: Four times a day (QID) | RECTAL | Status: DC | PRN

## 2021-09-19 MED ORDER — INSULIN ASPART 100 UNIT/ML IJ SOLN
0.0000 [IU] | INTRAMUSCULAR | Status: DC
  Administered 2021-09-20: 1 [IU] via SUBCUTANEOUS
  Administered 2021-09-20: 2 [IU] via SUBCUTANEOUS
  Administered 2021-09-20 – 2021-09-21 (×3): 1 [IU] via SUBCUTANEOUS
  Administered 2021-09-21: 2 [IU] via SUBCUTANEOUS
  Administered 2021-09-22: 1 [IU] via SUBCUTANEOUS

## 2021-09-19 MED ORDER — PANTOPRAZOLE SODIUM 40 MG PO PACK
40.0000 mg | PACK | Freq: Every day | ORAL | Status: DC
Start: 2021-09-19 — End: 2021-09-19

## 2021-09-19 MED ORDER — VANCOMYCIN HCL IN DEXTROSE 1-5 GM/200ML-% IV SOLN: 1000.0000 mg | Freq: Once | INTRAVENOUS | Status: DC

## 2021-09-19 MED ORDER — LACTATED RINGERS IV BOLUS
1000.0000 mL | Freq: Once | INTRAVENOUS | Status: AC
  Administered 2021-09-19: 1000 mL via INTRAVENOUS

## 2021-09-19 MED ORDER — ALBUTEROL SULFATE (2.5 MG/3ML) 0.083% IN NEBU: 2.5000 mg | INHALATION_SOLUTION | RESPIRATORY_TRACT | Status: DC | PRN

## 2021-09-19 MED ORDER — VANCOMYCIN HCL IN DEXTROSE 1-5 GM/200ML-% IV SOLN
1000.0000 mg | INTRAVENOUS | Status: DC
  Administered 2021-09-20 – 2021-09-22 (×3): 1000 mg via INTRAVENOUS
  Filled 2021-09-19 (×3): qty 200

## 2021-09-19 MED ORDER — ATORVASTATIN CALCIUM 80 MG PO TABS
80.0000 mg | ORAL_TABLET | Freq: Every day | ORAL | Status: DC
Start: 2021-09-19 — End: 2021-09-22
  Administered 2021-09-19 – 2021-09-22 (×4): 80 mg
  Filled 2021-09-19 (×2): qty 1
  Filled 2021-09-19 (×2): qty 2

## 2021-09-19 MED ORDER — FREE WATER
200.0000 mL | Status: DC
  Administered 2021-09-19 – 2021-09-20 (×10): 200 mL

## 2021-09-19 MED ORDER — FREE WATER: 120.0000 mL | Status: DC

## 2021-09-19 NOTE — Progress Notes (Signed)
Previous RN noted existing pressure ulcers. Night Rn to note size.

## 2021-09-19 NOTE — ED Triage Notes (Signed)
Pt arriving via [facility] coming from [facility]. Pt came in c/o of chest pain at 3am. Pt received 0.4 nitro and experienced relief.   EMS reported pt hearing rales in the left lung and rhonchi on the right lung   Pt sats dropped to 87. Pt is hypotensive. Manual BP in the field was 78/55. Pt tachycardic at 133 BPM.  CBG 130

## 2021-09-19 NOTE — Sepsis Progress Note (Signed)
Monitoring for the code sepsis protocol. °

## 2021-09-19 NOTE — ED Notes (Signed)
MD Hunsucker notified of CBG of 63, Amp of D50 verbal ordered

## 2021-09-19 NOTE — ED Notes (Signed)
Respiratory at bedside applying Bi-Pap

## 2021-09-19 NOTE — TOC Progression Note (Addendum)
Transition of Care [facility]) - Progression Note    Patient Details  Name: ADELYNNE JOERGER MRN: [id number] Date of Birth: Nov 09, 1960  Transition of Care [facility]) CM/SW [place], RN Phone Number:[phone]  09/24/2021, 3:09 PM  Clinical Narrative:    [facility] acknowledges consult for SNF. Patient is from [place]. SW will follow for return to [place].         Expected Discharge Plan and Services                                                 Social Determinants of Health (SDOH) Interventions    Readmission Risk Interventions     No data to display

## 2021-09-19 NOTE — Evaluation (Signed)
Clinical/Bedside Swallow Evaluation Patient Details  Name: [name] [name] MRN: [id number] Date of Birth: [birth year]  Today's Date: 09/28/2021 Time: SLP Start Time (ACUTE ONLY): 1400 SLP Stop Time (ACUTE ONLY): 1415 SLP Time Calculation (min) (ACUTE ONLY): 15 min  Past Medical History:  Past Medical History:  Diagnosis Date   Anxiety and depression    CAD (coronary artery disease)    a. CABG 06/2015: LIMA-LAD, SVG-RCA, SVG-OM2 b. STEMI s/p PCI to distal RCA lesion with a small Promus Premier stent. ( patent LIMA to the LAD, occluded SVG to OM, occluded SVG to RCA.)   Empty sella (HCC)    GERD (gastroesophageal reflux disease)    Hidradenitis axillaris    History of kidney stones    Hyperlipemia    Hypertension    Lupus ([place]) dx'd 2008   "they think I have this; have to get retested" (06/12/2015)   PONV (postoperative nausea and vomiting)    likes iv med or scopolamine patch works well   Psoriasis 06/15/2020   hands elbows, ears, small amount on scalp and bridge of nose   Rheumatoid aortitis    psorriatic arthritis   Rheumatoid arthritis ([place])    S/P angioplasty with stent; 10/04/15 to distal RCA lesion. with Promus premier. 10/08/2015   Scoliosis    Sleep apnea    patient states "it is mild" no cpap needed   Stroke ([place]) 05/2012   left MCA branches; "they say I have them all the time", denies residual on 06/12/2015   Tobacco abuse    Type II diabetes mellitus ([place])    Urinary frequency    and urgency   Wears glasses    for reading   Past Surgical History:  Past Surgical History:  Procedure Laterality Date   ABDOMINAL HYSTERECTOMY  31 yrs ago   total has ovaies   BREAST BIOPSY Bilateral yrd ago   benign   CARDIAC CATHETERIZATION N/A 06/13/2015   Procedure: Left Heart Cath and Coronary Angiography;  Surgeon: Peter M Martinique, MD;  Location: [place] [facility];  Service: Cardiovascular;  Laterality: N/A;   CARDIAC CATHETERIZATION N/A 10/04/2015   Procedure: Left Heart Cath and  Coronary Angiography;  Surgeon: [name], MD;  Location: [place] [facility];  Service: Cardiovascular;  Laterality: N/A;   CARDIAC CATHETERIZATION N/A 10/04/2015   Procedure: Coronary Stent Intervention;  Surgeon: [name], MD;  Location: [place] [facility];  Service: Cardiovascular;  Laterality: N/A;   CORONARY ARTERY BYPASS GRAFT N/A 06/19/2015   Procedure: CORONARY ARTERY BYPASS GRAFTING (CABG) TIMES FOUR USING LEFT INTERNAL MAMMARY, RIGHT SAPHENOUS LEG VEIN AND CRYO SAPHENOUS VEIN;  Surgeon: [name], MD;  Location: [place];  Service: Open Heart Surgery;  Laterality: N/A;   CYSTOSCOPY WITH RETROGRADE PYELOGRAM, URETEROSCOPY AND STENT PLACEMENT Left 06/21/2020   Procedure: CYSTOSCOPY WITH RETROGRADE PYELOGRAM, URETEROSCOPY, BASKET STONE EXTRACTION AND  STENT PLACEMENT;  Surgeon: Robley Fries, MD;  Location: [facility];  Service: Urology;  Laterality: Left;   DILATION AND CURETTAGE OF UTERUS  yrs ago   HOLMIUM LASER APPLICATION Left 09/22/2954   Procedure: HOLMIUM LASER APPLICATION;  Surgeon: Robley Fries, MD;  Location[facility];  Service: Urology;  Laterality: Left;   HYDRADENITIS EXCISION Right 12/20/2019   Procedure: EXCISION OF RIGHT  HIDRADENITIS AXILLARY;  Surgeon: Donnie Mesa, MD;  Location: [place];  Service: General;  Laterality: Right;   IR GASTROSTOMY TUBE MOD SED  08/20/2021   IR URETERAL STENT  LEFT NEW ACCESS W/O SEP NEPHROSTOMY CATH  01/10/2019   NEPHROLITHOTOMY Left 01/10/2019   Procedure: NEPHROLITHOTOMY PERCUTANEOUS;  Surgeon: Kathie Rhodes, MD;  Location: [facility];  Service: Urology;  Laterality: Left;   TEE WITHOUT CARDIOVERSION N/A 06/19/2015   Procedure: TRANSESOPHAGEAL ECHOCARDIOGRAM (TEE);  Surgeon: [name], MD;  Location: [place];  Service: Open Heart Surgery;  Laterality: N/A;   TONSILLECTOMY  age 41   TUBAL LIGATION  many yrs ago   HPI:  Patient known to SLP department as she has been seen during recent  past admissions in February and May. She had an MBS on 08/13/21 which reported a moderately impaired pharyngeal and cervical esophageal dysphagia with moderate to severe amount of pharyngeal residuals with thin and nectar thick liquids and puree solids. Recommendation at that time was for puree solids, nectar thick liquids if patient willing to take a high risk of aspiration. She got a PEG tube and was discharged to SNF. Per chart review, she reportedly has not been taking any PO's and is only getting PEG feedings but continues with audible secretions which she is unable to clear. Other PMH includes: OSA not on CPAP, CVA, esophageal dysmotility, HTN, depression/anxiety, CAD s/p CABG. She was admitted on 10/02/2021 with acute on chronic hypoxemia and hypercarbic respiratory failure, found to be in renal failure, dehydrated and profoundly hypovolemic. Her son had reported at time of admission that patient has been at SNF getting primary nutrition via PEG and not taking any PO's.    Assessment / Plan / Recommendation  Clinical Impression  Patient presenting with suspected severe and chronic pharyngeal and esophageal dysphagia as has been noted in prior admissions. Currently, she is lethargic, weak and continues with audible secretions that she is unable to expectorate despite attempts with cough. No attempts to swallow when SLP providing small amounts water on toothette sponge, but patient did appear to derive some pleasure/comfort from having mouth moistened. Unfortunately, SLP services does not have anything to offer this patient as she continues with mod-severe pharyngeal and esophageal phase dysphagia and she continues to decline medically. SLP not recommending any PO's but recommends frequent oral care and keeping patient's mouth moist and clear of secretions. SLP Visit Diagnosis: Dysphagia, unspecified (R13.10)    Aspiration Risk  Severe aspiration risk;Risk for inadequate nutrition/hydration    Diet  Recommendation NPO   Medication Administration: Via alternative means    Other  Recommendations Oral Care Recommendations: Oral care QID;Staff/trained caregiver to provide oral care    Recommendations for follow up therapy are one component of a multi-disciplinary discharge planning process, led by the attending physician.  Recommendations may be updated based on patient status, additional functional criteria and insurance authorization.  Follow up Recommendations No SLP follow up      Assistance Recommended at Discharge None  Functional Status Assessment Patient has had a recent decline in their functional status and demonstrates the ability to make significant improvements in function in a reasonable and predictable amount of time.  Frequency and Duration            Prognosis        Swallow Study   General Date of Onset: 10/13/2021 HPI: Patient known to SLP department as she has been seen during recent past admissions in February and May. She had an MBS on 08/13/21 which reported a moderately impaired pharyngeal and cervical esophageal dysphagia with moderate to severe amount of pharyngeal residuals with thin and nectar thick liquids and puree solids. Recommendation at  that time was for puree solids, nectar thick liquids if patient willing to take a high risk of aspiration. She got a PEG tube and was discharged to SNF. Per chart review, she reportedly has not been taking any PO's and is only getting PEG feedings but continues with audible secretions which she is unable to clear. Other PMH includes: OSA not on CPAP, CVA, esophageal dysmotility, HTN, depression/anxiety, CAD s/p CABG. She was admitted on 10/04/2021 with acute on chronic hypoxemia and hypercarbic respiratory failure, found to be in renal failure, dehydrated and profoundly hypovolemic. Her son had reported at time of admission that patient has been at SNF getting primary nutrition via PEG and not taking any PO's. Type of Study:  Bedside Swallow Evaluation Previous Swallow Assessment: BSE's during recent past admissions, MBS 07/3021 Diet Prior to this Study: NPO Temperature Spikes Noted: No Respiratory Status: Nasal cannula History of Recent Intubation: No Behavior/Cognition: Cooperative;Alert;Lethargic/Drowsy Oral Cavity Assessment: Dry;Dried secretions Oral Care Completed by SLP: Yes Oral Cavity - Dentition: Adequate natural dentition Self-Feeding Abilities: Total assist Patient Positioning: Partially reclined Baseline Vocal Quality: Low vocal intensity;Normal Volitional Cough: Congested;Wet Volitional Swallow: Unable to elicit    Oral/Motor/Sensory Function Overall Oral Motor/Sensory Function: Other (comment) (patient is profoundly weak and oral motor exam not completed)   Ice Chips     Thin Liquid Thin Liquid: Impaired Pharyngeal  Phase Impairments: Other (comments) Other Comments: very small amount of water via toothette swab with patient accepting but not attempting to swallow    Nectar Thick     Honey Thick     Puree     Solid           Sonia Baller, MA, CCC-SLP Speech Therapy

## 2021-09-19 NOTE — ED Notes (Signed)
IV team at bedside 

## 2021-09-19 NOTE — ED Provider Notes (Signed)
[facility] Emergency Department Provider Note MRN:  [id number]  Arrival date & time: 09/18/2021     Chief Complaint   Shortness of Breath   History of Present Illness   [name] [name] is a 61 y.o. year-old female presents to the ED with chief complaint of SOB and chest pain.  From [place].  Began with CP and SOB that started around 3am.  Took a SL nitro with some relief.  EMS called due to worsening SOB.  BP was reportedly 78/55 in the field with tachycardia to 133.  Hx of CHF and pneumonia.  Denies fever. Marland Kitchen  History provided by patient.   Review of Systems  Pertinent review of systems noted in HPI.    Physical Exam   Vitals:   10/14/2021 0620 10/01/2021 0625  BP: 117/73 117/75  Pulse: (!) 114 (!) 110  Resp: 18 (!) 26  Temp: (!) 101.4 F (38.6 C) (!) 101.3 F (38.5 C)  SpO2: 100% 100%    CONSTITUTIONAL:  ill-appearing, NAD NEURO:  Alert and oriented x 3, CN 3-12 grossly intact EYES:  eyes equal and reactive ENT/NECK:  Supple, no stridor  CARDIO:  tachycardic, regular rhythm, appears well-perfused  PULM:  Moderate respiratory distress, increased WOB, rales and rhonchi bilaterally GI/GU:  non-distended, non tender, g-tube in place MSK/SPINE:  No gross deformities, no edema, moves all extremities  SKIN:  no rash, sacral decubitus ulcer   *Additional and/or pertinent findings included in MDM below  Diagnostic and Interventional Summary    EKG Interpretation  Date/Time:  Thursday September 19 2021 04:37:46 EDT Ventricular Rate:  133 PR Interval:  96 QRS Duration: 92 QT Interval:  367 QTC Calculation: 546 R Axis:   86 Text Interpretation: Sinus tachycardia Borderline right axis deviation Low voltage, precordial leads Nonspecific T abnrm, anterolateral leads Minimal ST elevation, inferior leads Prolonged QT interval Otherwise no significant change Confirmed by Deno Etienne [phone]) on 09/25/2021 4:55:41 AM       Labs Reviewed   LACTIC ACID, PLASMA - Abnormal; Notable for the following components:      Result Value   Lactic Acid, Venous 3.5 (*)    All other components within normal limits  COMPREHENSIVE METABOLIC PANEL - Abnormal; Notable for the following components:   Sodium 154 (*)    CO2 35 (*)    Glucose, Bld 132 (*)    BUN 49 (*)    Creatinine, Ser 1.30 (*)    Total Protein 9.8 (*)    Albumin 1.8 (*)    AST 77 (*)    GFR, Estimated 46 (*)    All other components within normal limits  CBC WITH DIFFERENTIAL/PLATELET - Abnormal; Notable for the following components:   HCT 50.2 (*)    MCHC 28.7 (*)    RDW 17.2 (*)    nRBC 0.3 (*)    Lymphs Abs 0.4 (*)    nRBC 1 (*)    All other components within normal limits  APTT - Abnormal; Notable for the following components:   aPTT 40 (*)    All other components within normal limits  TROPONIN I (HIGH SENSITIVITY) - Abnormal; Notable for the following components:   Troponin I (High Sensitivity) 98 (*)    All other components within normal limits  CULTURE, BLOOD (ROUTINE X 2)  CULTURE, BLOOD (ROUTINE X 2)  URINE CULTURE  PROTIME-INR  LACTIC ACID, PLASMA  URINALYSIS, ROUTINE W REFLEX MICROSCOPIC  BRAIN NATRIURETIC PEPTIDE  TROPONIN I (  HIGH SENSITIVITY)    DG Chest Port 1 View  Final Result      Medications  ipratropium-albuterol (DUONEB) 0.5-2.5 (3) MG/3ML nebulizer solution 3 mL (3 mLs Nebulization Not Given 09/18/2021 0508)  metroNIDAZOLE (FLAGYL) IVPB 500 mg (500 mg Intravenous New Bag/Given 09/22/2021 0610)  vancomycin (VANCOREADY) IVPB 1500 mg/300 mL (has no administration in time range)  lactated ringers infusion ( Intravenous New Bag/Given 10/04/2021 0604)  lactated ringers bolus 1,000 mL (has no administration in time range)    And  lactated ringers bolus 1,000 mL (has no administration in time range)  ceFEPIme (MAXIPIME) 2 g in sodium chloride 0.9 % 100 mL IVPB (2 g Intravenous New Bag/Given 10/01/2021 0524)  lactated ringers bolus 1,000 mL (1,000 mLs  Intravenous New Bag/Given 09/18/2021 0544)  acetaminophen (TYLENOL) suppository 650 mg (650 mg Rectal Given 10/06/2021 0557)     Procedures  /  Critical Care .Critical Care  Performed by: Montine Circle, [name] Authorized by: Montine Circle, [name]   Critical care provider statement:    Critical care time (minutes):  60   Critical care was necessary to treat or prevent imminent or life-threatening deterioration of the following conditions:  Respiratory failure and sepsis   Critical care was time spent personally by me on the following activities:  Development of treatment plan with patient or surrogate, discussions with consultants, evaluation of patient's response to treatment, examination of patient, ordering and review of laboratory studies, ordering and review of radiographic studies, ordering and performing treatments and interventions, pulse oximetry, re-evaluation of patient's condition and review of old charts   ED Course and Medical Decision Making  I have reviewed the triage vital signs, the nursing notes, and pertinent available records from the EMR.  Social Determinants Affecting Complexity of Care: Patient has no clinically significant social determinants affecting this chief complaint..   ED Course: Clinical Course as of 10/13/2021 0629  Thu Sep 19, 2021  0504 Patient seen by and discussed with Dr. Tyrone Nine.  Will start with a liter of fluid and continue to monitor.  Full code.  On bipap.  May need intubation if not improving.   [RB]    Clinical Course User Index [RB] Montine Circle, [name]   Patient here with SOB.  Top differential diagnoses include aspiration pneumonia, CHF exacerbation, sepsis. Medical Decision Making Patient's work-up is concerning for sepsis thought secondary to aspiration pneumonia.  Patient noted to be febrile to 102.7, given rectal Tylenol.  Patient started on antibiotics immediately.  Decision was made to start with 1 L of IV fluid due to patient's CHF  history.  Chest x-ray was inconsistent with pulmonary edema and lactic acid resulted at 3.5.  At this point additional fluid resuscitation was ordered 30 mL/kg for sepsis.  Patient currently on BiPAP and maintaining O2 saturation in the mid 90s.  She remains tachycardic and tachypneic.  Amount and/or Complexity of Data Reviewed Labs: ordered.    Details: No leukocytosis, hypernatremia to 154, normal potassium, mildly increased creatinine at 1.3, lactic acid 3.5 worrisome for sepsis, troponin of 98 thought elevated secondary to demand Radiology: ordered and independent interpretation performed.    Details: Infiltrates to left lower lobe ECG/medicine tests: ordered.  Risk OTC drugs. Prescription drug management. Decision regarding hospitalization.     Consultants: I consulted with Dr. [name], Hospitalist, who has reviewed labs and patient with me.  She requests repeat lactic and repeat BMP prior to hospitalist accepting admission.  If lactic is uptrending or Na is worsening, then they  request consult with intensivist.  If labs are improving, the hospitalist will admit.  Patient signed out at shift change to [place], [place].  Plan as above.   Treatment and Plan: Patient's exam and diagnostic results are concerning for sepsis thought 2/2 aspiration pneumonia.  Feel that patient will need admission to the hospital for further treatment and evaluation.    Final Clinical Impressions(s) / ED Diagnoses     ICD-10-CM   1. Aspiration pneumonia of both lungs, unspecified aspiration pneumonia type, unspecified part of lung ([place])  J69.0     2. Sepsis, due to unspecified organism, unspecified whether acute organ dysfunction present [facility])  A41.9       ED Discharge Orders     None         Discharge Instructions Discussed with and Provided to Patient:   Discharge Instructions   None      Montine Circle, [name] 10/07/2021 [place], [place], DO 09/18/2021 [phone]

## 2021-09-19 NOTE — Progress Notes (Signed)
Patient received at 1424, during epic downtime.  Unable to chart or start working on new orders from doctor.  Incoming RN will chart a full assessment and start working on MDs orders.

## 2021-09-19 NOTE — H&P (Signed)
History and Physical    Patient: [name] [name] FTD:[id number] DOB: Jun 02, 1960 DOA: 09/29/2021 DOS: the patient was seen and examined on 10/06/2021 PCP: Leeroy Cha, MD  Patient coming from: SNF - [facility]; NOK: [place], [phone]   Chief Complaint: CP, hypoxia  HPI: [name] [name] is a 62 y.o. female with medical history significant of CAD s/p CABG; depression/anxiety; HTN; HLD; SLE; psoriasis with psoriatic arthritis; RA; OSA not on CPAP; CVA; esophageal dysmotility/dysphagia; chronic systolic CHF; and DM presenting with CP, hypoxia to 87%.  She was last admitted from 5/22-6/13 with aspiration PNA and ESBL E coli UTI with bacteremia.  She was treated with Meropenem -> Augmentin and was able to wean off O2.  She also had a large R-sided pleural effusion s/p thoracentesis.  She has had persistent hematemesis since and was seen for this in the ER on 6/27.    Her son reports that in 2017 she had CABG.  She did cardiac rehab, got better.  Her arthritis has gotten worse over time.  Last October, she stayed with her brother, was trying to buy a house.  She complained that it was cold in the house and her joints would hurt.  In December she moved into her own place but she continued to deteriorate.  She started having some trouble swallowing.  She was unresponsive in February - sepsis, losing weight, treated with antibiotics, had thoracentesis, went to [place].  She went home before Easter, was home maybe 2 weeks (able to ambulate with assistance) and then had a similar presentation.  She was having esophageal dysmotility, recommended hospice, had feeding tube.  [facility] told him last week she is not taking anything PO, but still has PNA again.  She is weak and can't really get up, can't turn herself over.  He wants her to be a full code.      ER Course:  SOB, tachycardia, recently admitted for aspiration PNA.  Given Vanc/Cefepime.  VBG ok.  Has foley - ulcers in pelvic  region.     Review of Systems: unable to review all systems due to the inability of the patient to answer questions. Past Medical History:  Diagnosis Date   Anxiety and depression    CAD (coronary artery disease)    a. CABG 06/2015: LIMA-LAD, SVG-RCA, SVG-OM2 b. STEMI s/p PCI to distal RCA lesion with a small Promus Premier stent. ( patent LIMA to the LAD, occluded SVG to OM, occluded SVG to RCA.)   Empty sella (HCC)    GERD (gastroesophageal reflux disease)    Hidradenitis axillaris    History of kidney stones    Hyperlipemia    Hypertension    Lupus ([place]) dx'd 2008   "they think I have this; have to get retested" (06/12/2015)   PONV (postoperative nausea and vomiting)    likes iv med or scopolamine patch works well   Psoriasis 06/15/2020   hands elbows, ears, small amount on scalp and bridge of nose   Rheumatoid aortitis    psorriatic arthritis   Rheumatoid arthritis ([place])    S/P angioplasty with stent; 10/04/15 to distal RCA lesion. with Promus premier. 10/08/2015   Scoliosis    Sleep apnea    patient states "it is mild" no cpap needed   Stroke ([place]) 05/2012   left MCA branches; "they say I have them all the time", denies residual on 06/12/2015   Tobacco abuse    Type II diabetes mellitus ([place])    Urinary frequency  and urgency   Wears glasses    for reading   Past Surgical History:  Procedure Laterality Date   ABDOMINAL HYSTERECTOMY  31 yrs ago   total has ovaies   BREAST BIOPSY Bilateral yrd ago   benign   CARDIAC CATHETERIZATION N/A 06/13/2015   Procedure: Left Heart Cath and Coronary Angiography;  Surgeon: Peter M Martinique, MD;  Location: [facility];  Service: Cardiovascular;  Laterality: N/A;   CARDIAC CATHETERIZATION N/A 10/04/2015   Procedure: Left Heart Cath and Coronary Angiography;  Surgeon: [name], MD;  Location: [facility];  Service: Cardiovascular;  Laterality: N/A;   CARDIAC CATHETERIZATION N/A 10/04/2015   Procedure: Coronary  Stent Intervention;  Surgeon: [name], MD;  Location: [facility];  Service: Cardiovascular;  Laterality: N/A;   CORONARY ARTERY BYPASS GRAFT N/A 06/19/2015   Procedure: CORONARY ARTERY BYPASS GRAFTING (CABG) TIMES FOUR USING LEFT INTERNAL MAMMARY, RIGHT SAPHENOUS LEG VEIN AND CRYO SAPHENOUS VEIN;  Surgeon: [name], MD;  Location: [facility];  Service: Open Heart Surgery;  Laterality: N/A;   CYSTOSCOPY WITH RETROGRADE PYELOGRAM, URETEROSCOPY AND STENT PLACEMENT Left 06/21/2020   Procedure: CYSTOSCOPY WITH RETROGRADE PYELOGRAM, URETEROSCOPY, BASKET STONE EXTRACTION AND  STENT PLACEMENT;  Surgeon: Robley Fries, MD;  Location: [facility];  Service: Urology;  Laterality: Left;   DILATION AND CURETTAGE OF UTERUS  yrs ago   HOLMIUM LASER APPLICATION Left 10/19/8848   Procedure: HOLMIUM LASER APPLICATION;  Surgeon: Robley Fries, MD;  Location: [facility];  Service: Urology;  Laterality: Left;   HYDRADENITIS EXCISION Right 12/20/2019   Procedure: EXCISION OF RIGHT  HIDRADENITIS AXILLARY;  Surgeon: Donnie Mesa, MD;  Location: [facility];  Service: General;  Laterality: Right;   IR GASTROSTOMY TUBE MOD SED  08/20/2021   IR URETERAL STENT LEFT NEW ACCESS W/O SEP NEPHROSTOMY CATH  01/10/2019   NEPHROLITHOTOMY Left 01/10/2019   Procedure: NEPHROLITHOTOMY PERCUTANEOUS;  Surgeon: Kathie Rhodes, MD;  Location: [facility];  Service: Urology;  Laterality: Left;   TEE WITHOUT CARDIOVERSION N/A 06/19/2015   Procedure: TRANSESOPHAGEAL ECHOCARDIOGRAM (TEE);  Surgeon: [name], MD;  Location: [facility];  Service: Open Heart Surgery;  Laterality: N/A;   TONSILLECTOMY  age 46   TUBAL LIGATION  many yrs ago   Social History:  reports that she quit smoking about 7 years ago. Her smoking use included cigarettes. She has a 18.00 pack-year smoking history. She has never used smokeless tobacco. She reports that she does not drink alcohol and does not use drugs.  Allergies   Allergen Reactions   Apremilast Other (See Comments)    unknown   Latex Itching, Swelling and Rash    Family History  Problem Relation Age of Onset   Diabetes Mother    Heart failure Mother    Breast cancer Mother        70s   Stroke Maternal Aunt    Depression Son    Diabetes Other        maternal   Breast cancer Other        2 diff great maternal aunts -  both 44s   Alzheimer's disease Other        maternal    Prior to Admission medications   Medication Sig Start Date End Date Taking? Authorizing Provider  acetaminophen (TYLENOL) 650 MG suppository Place 1 suppository (650 mg total) rectally every 6 (six) hours as needed for fever or mild pain. 08/23/21  [place] Donnamarie Poag, DO  aspirin (ASPIRIN CHILDRENS) 81 MG chewable tablet Place 1 tablet (81 mg total) into feeding tube daily. 08/23/21 09/22/21  [place] Donnamarie Poag, DO  atorvastatin (LIPITOR) 80 MG tablet Place 1 tablet (80 mg total) into feeding tube daily. 08/23/21   [place] Donnamarie Poag, DO  bisacodyl (DULCOLAX) 10 MG suppository Place 1 suppository (10 mg total) rectally daily as needed for moderate constipation. 08/23/21   [place] Eric J, DO  escitalopram (LEXAPRO) 10 MG tablet Place 1 tablet (10 mg total) into feeding tube at bedtime. 08/23/21   [place] Donnamarie Poag, DO  Evolocumab with Infusor ([place]) 420 MG/3.5ML SOCT Inject 420 mg into the skin every 30 (thirty) days. 04/26/20   Kroeger, Lorelee Cover., [name]  folic acid (FOLVITE) 1 MG tablet Place 1 tablet (1 mg total) into feeding tube daily. 08/24/21   [place] Donnamarie Poag, DO  furosemide (LASIX) 20 MG tablet Place 1 tablet (20 mg total) into feeding tube daily. 08/27/21   [place] Donnamarie Poag, DO  gabapentin (NEURONTIN) 250 MG/5ML solution Place 2 mLs (100 mg total) into feeding tube every 12 (twelve) hours. 08/23/21   [place] Donnamarie Poag, DO  guaiFENesin (ROBITUSSIN) 100 MG/5ML liquid Place 15 mLs into feeding tube every 6 (six) hours as needed for cough or to loosen phlegm. 08/23/21   [place]  Donnamarie Poag, DO  metoCLOPramide (REGLAN) 10 MG tablet Take 1 tablet (10 mg total) by mouth every 8 (eight) hours as needed for refractory nausea / vomiting. 08/26/21 08/26/22  [place] Eric J, DO  metoprolol tartrate (LOPRESSOR) 25 mg/10 mL SUSP Place 10 mLs (25 mg total) into feeding tube 2 (two) times daily. 08/23/21   [place] Eric J, DO  nitroGLYCERIN (NITROSTAT) 0.4 MG SL tablet DISSOLVE 1 TABLET UNDER THE TONGUE EVERY 5 MINUTES AS  NEEDED FOR CHEST PAIN. MAX  OF 3 TABLETS IN 15 MINUTES. CALL 911 IF PAIN PERSISTS. 06/20/21   Medina-Vargas, Monina C, NP  Nutritional Supplements (FEEDING SUPPLEMENT, JEVITY 1.5 CAL/FIBER,) LIQD Place 237 mLs into feeding tube 5 (five) times daily. 08/23/21   [place] Donnamarie Poag, DO  Nutritional Supplements (FEEDING SUPPLEMENT, PROSOURCE TF,) liquid Place 45 mLs into feeding tube 2 (two) times daily. 08/23/21   [place] Eric J, DO  ondansetron (ZOFRAN) 4 MG tablet Place 1 tablet (4 mg total) into feeding tube every 8 (eight) hours as needed for nausea or vomiting. 08/26/21 08/26/22  [place] Donnamarie Poag, DO  ondansetron (ZOFRAN-ODT) 4 MG disintegrating tablet Take 1 tablet (4 mg total) by mouth every 8 (eight) hours as needed. Patient taking differently: Take 4 mg by mouth every 8 (eight) hours as needed for nausea or vomiting. 06/20/21   Medina-Vargas, Monina C, NP  pantoprazole sodium (PROTONIX) 40 mg Place 40 mg into feeding tube daily. 08/23/21   [place] Donnamarie Poag, DO  phosphorus (K PHOS NEUTRAL) [phone] MG tablet Place 1 tablet (250 mg total) into feeding tube daily. 08/24/21   [place] Donnamarie Poag, DO  Water For Irrigation, Sterile (FREE WATER) SOLN Place 100 mLs into feeding tube every 6 (six) hours. 08/23/21   [place] Donnamarie Poag, DO  Water For Irrigation, Sterile (FREE WATER) SOLN Place 120 mLs into feeding tube 5 (five) times daily. 08/23/21   [place] Eric J, DO    Physical Exam: Vitals:   09/16/2021 0845 10/09/2021 0855 10/03/2021 1030 09/21/2021 1045  BP:  107/75 92/62 105/71  Pulse:    (!) 102   Resp: (!) 21 (!) 25 (!) 23 (!) 23  Temp: 99.4 F (37.4 C) 99.4 F (37.4 C) 99.4 F (37.4 C) 99.5 F (37.5 C)  SpO2:    99%  Weight:      Height:       General:  Appears frail, debilitated, on BIPAP Eyes:    normal lids, eyes closed throughout evaluation ENT:  grossly normal lip; BIPAP in place Neck:  no LAD, masses or thyromegaly Cardiovascular:  RR with tachycardia, no m/r/g. No LE edema.  Respiratory:   Scattered rhonchi.  Mildly to moderately increased respiratory effort on BIPAP Abdomen:  soft, NT, ND Skin:  no rash or induration seen on limited exam Musculoskeletal:  decreased tone BUE/BLE, no bony abnormality Lower extremity:  No LE edema.  Limited foot exam with no ulcerations.  2+ distal pulses. Psychiatric:  did not open eyes throughout evaluation Neurologic:  unable to perform   Radiological Exams on Admission: Independently reviewed - see discussion in A/P where applicable  DG Chest Port 1 View  Result Date: 09/15/2021 CLINICAL DATA:  Questionable sepsis EXAM: PORTABLE CHEST 1 VIEW COMPARISON:  09/10/2021 FINDINGS: Chronic cardiomegaly. Prior CABG. Infiltrate at the left lung base and right mid chest. Trace pleural effusions. Chronic hyperinflation. IMPRESSION: Continued infiltrates in the left lower and right mid chest. Stable trace pleural effusion on the right. Electronically Signed   By: Jorje Guild M.D.   On: 09/22/2021 05:17    EKG: Independently reviewed.  Sinus tachycardia with rate 133; prolonged QTc 546; nonspecific ST changes with no evidence of acute ischemia   Labs on Admission: I have personally reviewed the available labs and imaging studies at the time of the admission.  Pertinent labs:    VBG: 7.414/51.2/32.7 Na++ 154; 139 on 6/27 Glucose 132 BUN 49/Creatinine 1.3/GFR 46; 17/0.56/>60 on 6/27 Albumin 1.8 BNP 187.9 HS troponin 98, 93 Lactate 3.5 WBC 6.3 INR 1.2 UA: moderate Hgb, >300 protein, many bacteria Blood and urine cultures  pending   Assessment and Plan: Principal Problem:   Sepsis due to pneumonia [facility]) Active Problems:   Hyperlipidemia with target LDL less than 70   Accelerated hypertension   Insulin-requiring or dependent type II diabetes mellitus ([place])   CAD (coronary artery disease) of artery bypass graft   Chronic combined systolic and diastolic CHF (congestive heart failure) (HCC)   Aspiration pneumonia (HCC)   Dehydration with hypernatremia    Sepsis due to Aspiration PNA -SIRS criteria in this patient includes: Fever, tachycardia, tachpynea  -Patient has evidence of acute organ failure with elevated lactate >2; encephalopathy; BIPAP-dependent respiratory failure that is not easily explained by another condition. -While awaiting blood cultures, this appears to be a preseptic condition. -Sepsis protocol initiated -Suspected source is recurrent aspiration PNA -Blood and urine cultures pending -Will admit to progressive due to: hemodynamic instability; AMS that is severe or persistent; failure of outpatient treatment -Treat with IV Flagyl/Cefepime/Vanc  -Will trend lactate to ensure improvement -Procalcitonin level is pending -This patient is at risk for shock and may require vasopressors to keep MAP >65 and/or due to lactate >2 despite volume resuscitation; shock is associated with >40% mortality. -Imaging is c/w aspiration PNA -She has known esophageal dysmotility and a PEG tube but is continuing to aspirate despite strict NPO status -She was seen on 6/27 and had persistent infiltrates c/w aspiration -Pulmonology has been consulted due to recurrent aspiration  -Will also consult palliative care  Hypernatremia/AKI -She appears to be dehydrated currently -Will rehydrate with IVF and continue to follow  CAD -s/p CABG -Continue ASA  HTN -Hold Lopressor due to marginal BP at this time  HLD -Continue Lipitor -She is also on Repatha  Lupus/RA/psoriasis -She doesn't appear to be on  immunosuppressants at this time  OSA -Not on CPAP  Chronic combined CHF -Echo on 05/01/21 was 45% with grade 1 diastolic dysfunction -Appears to be dehydrated currently  DM -A1c was 6.7 on 2/14 -She is not on apparent medications at this time -Will order sensitive-scale SSI  Mood d/o -Continue escitalopram     Total critical care time: 65 minutes Critical care time was exclusive of separately billable procedures and treating other patients. Critical care was necessary to treat or prevent imminent or life-threatening deterioration. Critical care was time spent personally by me on the following activities: development of treatment plan with patient and/or surrogate as well as nursing, discussions with consultants, evaluation of patient's response to treatment, examination of patient, obtaining history from patient or surrogate, ordering and performing treatments and interventions, ordering and review of laboratory studies, ordering and review of radiographic studies, pulse oximetry and re-evaluation of patient's condition.     Advance Care Planning:   Code Status: Full Code   Consults: Pulmonology; Palliative care; RT; TOC team; ST  DVT Prophylaxis: Lovenox  Family Communication: None present; I spoke with her son by telephone at the time of admission  Severity of Illness: The appropriate patient status for this patient is INPATIENT. Inpatient status is judged to be reasonable and necessary in order to provide the required intensity of service to ensure the patient's safety. The patient's presenting symptoms, physical exam findings, and initial radiographic and laboratory data in the context of their chronic comorbidities is felt to place them at high risk for further clinical deterioration. Furthermore, it is not anticipated that the patient will be medically stable for discharge from the hospital within 2 midnights of admission.   * I certify that at the point of admission it is  my clinical judgment that the patient will require inpatient hospital care spanning beyond 2 midnights from the point of admission due to high intensity of service, high risk for further deterioration and high frequency of surveillance required.*  Author: Karmen Bongo, MD 09/30/2021 10:53 AM  For on call review [email or web address].

## 2021-09-19 NOTE — Progress Notes (Signed)
Pharmacy Antibiotic Note  [name] [name] is a 61 y.o. female admitted on 10/11/2021 with sepsis. Given vancomycin, flagyl, and cefepime in the ED. WBC WNL and afebrile. Pharmacy has been consulted for vancomycin dosing.  AUC goal: 400-550  Plan: Vancomycin '1500mg'$  x 1 load followed by vancomycin '1000mg'$  q24h (eAUC 455, Scr 1.3) F/u renal function, clinical course, and levels as needed  Height: '5\' 6"'$  (167.6 cm) Weight: 77.6 kg (171 lb 1.2 oz) IBW/kg (Calculated) : 59.3  Temp (24hrs), Avg:101.1 F (38.4 C), Min:99.4 F (37.4 C), Max:102.7 F (39.3 C)  Recent Labs  Lab 09/21/2021 0458  WBC 6.3  CREATININE 1.30*  LATICACIDVEN 3.5*    Estimated Creatinine Clearance: 47.2 mL/min (A) (by C-G formula based on SCr of 1.3 mg/dL (H)).    Allergies  Allergen Reactions   Apremilast Other (See Comments)    unknown   Latex Itching, Swelling and Rash    Antimicrobials this admission: Vancomycin 7/6 > Cefepime 7/6 > Flagyl 7/6 >  Dose adjustments this admission:  Microbiology results: 7/6 BCx: sent 7/6 UCx: sent   Thank you for allowing pharmacy to be a part of this patient's care.  Levonne Spiller 09/24/2021 10:42 AM

## 2021-09-19 NOTE — Consult Note (Signed)
NAME:  [name] [name], MRN:  [id number], DOB:  Dec 30, 1960, LOS: 0 ADMISSION DATE:  09/25/2021, CONSULTATION DATE:  10/01/2021 REFERRING MD:  Dr. Lorin Mercy, CHIEF COMPLAINT:  SOB   History of Present Illness:  HPI obtained from medical chart review and per son at bedside as patient is lethargic on BiPAP.  61 year old female with prior history as below presenting from [facility] with complaints of progressive weakness, chest pain, SOB, and hypoxia> 87% on room air.  Patient has had declining health since December.  Multiple ER visits and admitted in February and twice in May with sepsis, aspiration pneumonia, ESBL UTI and bacteremia, altered mental status, and failure to thrive.  Son reports she will get better for a week or so, but not good enough to come home, then get sick again despite antibiotics.   A peg tube was placed (08/20/21) last admission for recurrent aspiration pneumonia and dysphagia after barium swallow demonstrated moderate esophageal dysmotility without evidence of stricture.  Evaluated by GI with no further workup recommendations.   Son reports since PEG placement, she has been NPO and uses PEG for all feedings/ meds, however has copious thick oral secretions and frequent coughs frequently.    In ER, she was febrile 102.2, tachypneic with increased WOB requiring BiPAP, hypoxic, with initial SBP 90's improved after 1L NS.  CXR showing recurrent LLL and suspected RML infiltrate.  UA ok, other labs significant for Na 154, sCr 1.3, BNP 187, trop 98> 93, lactic 3.5> 2.2, PCT 0.47 and normal WBC.  EKG showing prolonged Qtc.  TRH called for admit.  Remains lethargic on BiPAP and full code per family wishes.  PCCM eval for ICU admit.   Pertinent  Medical History  Former smoker, OSA not on CPAP, esophageal dysmotility s/p PEG 08/20/21, dysphagia, HFrEF/ HFpEF, (CAD s/p CABG, HTN, CVA, HLD, psoriatic arthritis, psoriasis, RA, SLE, DM, anxiety, depression  Significant Hospital Events: Including procedures,  antibiotic start and stop dates in addition to other pertinent events   7/6 Admit  Interim History / Subjective:   Objective   Blood pressure 105/71, pulse (!) 102, temperature 99.5 F (37.5 C), resp. rate (!) 23, height '5\' 6"'$  (1.676 m), weight 77.6 kg, SpO2 99 %.    FiO2 (%):  [80 %] 80 %   Intake/Output Summary (Last 24 hours) at 09/25/2021 1122 Last data filed at 10/09/2021 1042 Gross per 24 hour  Intake 3828.44 ml  Output --  Net 3828.44 ml   Filed Weights   10/01/2021 0443  Weight: 77.6 kg   Examination: General:  chronically ill and debilitated elderly female in ER stretcher on BiPAP in NAD HEENT: temporal wasting, full face bipap Neuro: lethargic but arouses to verbal and follows simple commands CV: rr, NSR PULM:  BiPAP 18/5, 70% with TVs 400-450, coarse lungs and diminished in bases, no wheezes GI: soft, bs+, peg tube site with milky/tan drainage Extremities: warm/dry, no LE edema, poor turgor   Resolved Hospital Problem list    Assessment & Plan:   Recurrent aspiration pneumonia Hypoxic respiratory failure P:  - admit to ICU for now as she remains full code, high risk for deterioration/intubation - has been on BiPAP for over 6 hrs now> WOB much better.  Will trial on HFNC, and if more support needed, HHFNC would be a better option rather than BiPAP given recurrent aspiration - wean O2 for sat goal > 92% - NPO - continue cefepime/ vanc for now.  Check MRSA PCR, if neg, can  stop vanc - check urine strep  - aggressive pulm hygiene> prn BD, mucolytic's, IS, frequent turns - ongoing GOC.  Recommended continued noninvasive support with fluids, abx, supportive care but no CPR/ intubation as these will not reverse or improve her chronic aspiration.  PMT consulted.  Son, [name] seems receptive but needs to discuss with his brother prior to making decision.  Remains full code  Sepsis secondary to aspiration pneumonia - remains normotensive, MAP  - lactate trending down, 3.5>  2.2 - abx as above - follow cultures   Oropharyngeal dysphagia Esophageal dysmotility s/p PEG 08/20/21 - EGD 08/2020> normal appearing esophagus - Barium swallow 05/2021> no stricture, moderate esophageal dysmotility - unclear etiology of esophageal dysmotility, ? CTD given hx of PSA, RA, SLE P:  - NPO - HOB> 30 at all times - TF per PEG - repeat SLP - hold chronic reglan/ prn zofran given prolonged Qtc  Hypernatremia AKI > appears clinically dry  P:  - s/p IVF in ER.  Increase FWF 200 ml q 2hrs - strict I/O, trend renal indices - avoid nephrotoxins  Encephalopathy - presumed septic.  Supportive care as above  - non focal, trend neuro exams  CAB w/ prior CABG HTN HLD Combined systolic/ diastolic HF Prolonged QTC - tele monitoring - avoid Qtc prolonging meds; goal K> 4, Mag> 2 - cont ASA, lipitor - hold home antihypertensives   DM - SSI  Anxiety/ depression - cont escitalopram  Failure to thrive Poor mobility - ongoing GOC.  Appreciate PMT input  Best Practice (right click and "Reselect all SmartList Selections" daily)   Diet/type: NPO; TF per PEG DVT prophylaxis: LMWH GI prophylaxis: PPI Lines: N/A Foley:  N/A Code Status:  full code Last date of multidisciplinary goals of care discussion [7/6] - see above.  PMT consulted.   Labs   CBC: Recent Labs  Lab 09/30/2021 0458 09/14/2021 0742  WBC 6.3  --   NEUTROABS 5.7  --   HGB 14.4 9.9*  HCT 50.2* 29.0*  MCV 98.4  --   PLT 204  --     Basic Metabolic Panel: Recent Labs  Lab 10/01/2021 0458 09/24/2021 0742 09/17/2021 0952  NA 154* 156* 152*  K 4.6 4.0 4.5  CL 111  --  115*  CO2 35*  --  30  GLUCOSE 132*  --  103*  BUN 49*  --  47*  CREATININE 1.30*  --  1.25*  CALCIUM 9.3  --  8.1*   GFR: Estimated Creatinine Clearance: 49.1 mL/min (A) (by C-G formula based on SCr of 1.25 mg/dL (H)). Recent Labs  Lab 09/25/2021 0458 10/05/2021 0953  WBC 6.3  --   LATICACIDVEN 3.5* 2.2*    Liver Function  Tests: Recent Labs  Lab 10/11/2021 0458  AST 77*  ALT 24  ALKPHOS 108  BILITOT 0.6  PROT 9.8*  ALBUMIN 1.8*   No results for input(s): "LIPASE", "AMYLASE" in the last 168 hours. No results for input(s): "AMMONIA" in the last 168 hours.  ABG    Component Value Date/Time   PHART 7.391 08/05/2021 1153   PCO2ART 45.9 08/05/2021 1153   PO2ART 73 (L) 08/05/2021 1153   HCO3 32.7 (H) 09/19/2021 0742   TCO2 34 (H) 09/29/2021 0742   ACIDBASEDEF 3.0 (H) 04/30/2021 2130   O2SAT 85 09/20/2021 0742     Coagulation Profile: Recent Labs  Lab 09/14/2021 0458  INR 1.2    Cardiac Enzymes: No results for input(s): "CKTOTAL", "CKMB", "CKMBINDEX", "TROPONINI" in the  last 168 hours.  HbA1C: Hgb A1c MFr Bld  Date/Time Value Ref Range Status  04/30/2021 06:34 PM 6.7 (H) 4.8 - 5.6 % Final    Comment:    (NOTE) Pre diabetes:          5.7%-6.4%  Diabetes:              >6.4%  Glycemic control for   <7.0% adults with diabetes   04/06/2020 07:43 PM 7.4 (H) 4.8 - 5.6 % Final    Comment:    (NOTE) Pre diabetes:          5.7%-6.4%  Diabetes:              >6.4%  Glycemic control for   <7.0% adults with diabetes     CBG: No results for input(s): "GLUCAP" in the last 168 hours.  Review of Systems:   Unable  Past Medical History:  She,  has a past medical history of Anxiety and depression, CAD (coronary artery disease), Empty sella ([place]), GERD (gastroesophageal reflux disease), Hidradenitis axillaris, History of kidney stones, Hyperlipemia, Hypertension, Lupus ([place]) (dx'd 2008), PONV (postoperative nausea and vomiting), Psoriasis (06/15/2020), Rheumatoid aortitis, Rheumatoid arthritis ([place]), S/P angioplasty with stent; 10/04/15 to distal RCA lesion. with Promus premier. (10/08/2015), Scoliosis, Sleep apnea, Stroke ([place]) (05/2012), Tobacco abuse, Type II diabetes mellitus ([place]), Urinary frequency, and Wears glasses.   Surgical History:   Past Surgical History:  Procedure Laterality Date    ABDOMINAL HYSTERECTOMY  31 yrs ago   total has ovaies   BREAST BIOPSY Bilateral yrd ago   benign   CARDIAC CATHETERIZATION N/A 06/13/2015   Procedure: Left Heart Cath and Coronary Angiography;  Surgeon: Peter M Martinique, MD;  Location: [facility];  Service: Cardiovascular;  Laterality: N/A;   CARDIAC CATHETERIZATION N/A 10/04/2015   Procedure: Left Heart Cath and Coronary Angiography;  Surgeon: [name], MD;  Location: [facility];  Service: Cardiovascular;  Laterality: N/A;   CARDIAC CATHETERIZATION N/A 10/04/2015   Procedure: Coronary Stent Intervention;  Surgeon: [name], MD;  Location: [facility];  Service: Cardiovascular;  Laterality: N/A;   CORONARY ARTERY BYPASS GRAFT N/A 06/19/2015   Procedure: CORONARY ARTERY BYPASS GRAFTING (CABG) TIMES FOUR USING LEFT INTERNAL MAMMARY, RIGHT SAPHENOUS LEG VEIN AND CRYO SAPHENOUS VEIN;  Surgeon: [name], MD;  Location: [facility];  Service: Open Heart Surgery;  Laterality: N/A;   CYSTOSCOPY WITH RETROGRADE PYELOGRAM, URETEROSCOPY AND STENT PLACEMENT Left 06/21/2020   Procedure: CYSTOSCOPY WITH RETROGRADE PYELOGRAM, URETEROSCOPY, BASKET STONE EXTRACTION AND  STENT PLACEMENT;  Surgeon: Robley Fries, MD;  Location: [facility];  Service: Urology;  Laterality: Left;   DILATION AND CURETTAGE OF UTERUS  yrs ago   HOLMIUM LASER APPLICATION Left 09/22/2954   Procedure: HOLMIUM LASER APPLICATION;  Surgeon: Robley Fries, MD;  Location: [facility];  Service: Urology;  Laterality: Left;   HYDRADENITIS EXCISION Right 12/20/2019   Procedure: EXCISION OF RIGHT  HIDRADENITIS AXILLARY;  Surgeon: Donnie Mesa, MD;  Location: [facility];  Service: General;  Laterality: Right;   IR GASTROSTOMY TUBE MOD SED  08/20/2021   IR URETERAL STENT LEFT NEW ACCESS W/O SEP NEPHROSTOMY CATH  01/10/2019   NEPHROLITHOTOMY Left 01/10/2019   Procedure: NEPHROLITHOTOMY PERCUTANEOUS;  Surgeon: Kathie Rhodes, MD;   Location: [facility];  Service: Urology;  Laterality: Left;   TEE WITHOUT CARDIOVERSION N/A 06/19/2015   Procedure: TRANSESOPHAGEAL ECHOCARDIOGRAM (TEE);  Surgeon: [name], MD;  Location: [facility];  Service: Open Heart Surgery;  Laterality: N/A;   TONSILLECTOMY  age 73   TUBAL LIGATION  many yrs ago     Social History:   reports that she quit smoking about 7 years ago. Her smoking use included cigarettes. She has a 18.00 pack-year smoking history. She has never used smokeless tobacco. She reports that she does not drink alcohol and does not use drugs.   Family History:  Her family history includes Alzheimer's disease in an other family member; Breast cancer in her mother and another family member; Depression in her son; Diabetes in her mother and another family member; Heart failure in her mother; Stroke in her maternal aunt.   Allergies Allergies  Allergen Reactions   Apremilast Other (See Comments)    unknown   Latex Itching, Swelling and Rash     Home Medications  Prior to Admission medications   Medication Sig Start Date End Date Taking? Authorizing Provider  acetaminophen (TYLENOL) 325 MG tablet Take 650 mg by mouth every 6 (six) hours as needed for mild pain or moderate pain. Take 2 tablets='650mg'$  via G-tube every 8 hours prn for pain.   Yes [provider]  acetaminophen (TYLENOL) 650 MG suppository Place 1 suppository (650 mg total) rectally every 6 (six) hours as needed for fever or mild pain. 08/23/21  Yes [place] Donnamarie Poag, DO  aspirin (ASPIRIN CHILDRENS) 81 MG chewable tablet Place 1 tablet (81 mg total) into feeding tube daily. 08/23/21 09/22/21 Yes [place] Eric J, DO  atorvastatin (LIPITOR) 80 MG tablet Place 1 tablet (80 mg total) into feeding tube daily. 08/23/21  Yes [place] Eric J, DO  bisacodyl (DULCOLAX) 10 MG suppository Place 1 suppository (10 mg total) rectally daily as needed for moderate constipation. 08/23/21  Yes [place] Eric J, DO  docusate sodium (COLACE) 100 MG  capsule Take 100 mg by mouth daily as needed for mild constipation. 09/16/21  Yes [provider]  escitalopram (LEXAPRO) 10 MG tablet Place 1 tablet (10 mg total) into feeding tube at bedtime. 08/23/21  Yes [place] Donnamarie Poag, DO  folic acid (FOLVITE) 1 MG tablet Place 1 tablet (1 mg total) into feeding tube daily. 08/24/21  Yes [place] Eric J, DO  furosemide (LASIX) 20 MG tablet Place 1 tablet (20 mg total) into feeding tube daily. 08/27/21  Yes [place] Eric J, DO  gabapentin (NEURONTIN) 250 MG/5ML solution Place 2 mLs (100 mg total) into feeding tube every 12 (twelve) hours. 08/23/21  Yes [place] Eric J, DO  guaiFENesin (MUCINEX MAXIMUM STRENGTH PO) 20 mLs by Gastric Tube route every 4 (four) hours as needed (thick secretions and/or cough).   Yes [provider]  guaiFENesin (ROBITUSSIN) 100 MG/5ML liquid Place 15 mLs into feeding tube every 6 (six) hours as needed for cough or to loosen phlegm. 08/23/21  Yes [place] Eric J, DO  metoCLOPramide (REGLAN) 10 MG tablet Take 1 tablet (10 mg total) by mouth every 8 (eight) hours as needed for refractory nausea / vomiting. 08/26/21 08/26/22 Yes [place] Donnamarie Poag, DO  metoprolol tartrate (LOPRESSOR) 25 mg/10 mL SUSP Place 10 mLs (25 mg total) into feeding tube 2 (two) times daily. 08/23/21  Yes [place] Eric J, DO  nitroGLYCERIN (NITROSTAT) 0.4 MG SL tablet DISSOLVE 1 TABLET UNDER THE TONGUE EVERY 5 MINUTES AS  NEEDED FOR CHEST PAIN. MAX  OF 3 TABLETS IN 15 MINUTES. CALL 911 IF PAIN PERSISTS. 06/20/21  Yes Medina-Vargas, Monina C, NP  Nutritional Supplements (FEEDING SUPPLEMENT, JEVITY  1.5 CAL/FIBER,) LIQD Place 237 mLs into feeding tube 5 (five) times daily. 08/23/21  Yes [place] Donnamarie Poag, DO  Nutritional Supplements (FEEDING SUPPLEMENT, PROSOURCE TF,) liquid Place 45 mLs into feeding tube 2 (two) times daily. 08/23/21  Yes [place] Eric J, DO  ondansetron (ZOFRAN) 4 MG tablet Place 1 tablet (4 mg total) into feeding tube every 8 (eight) hours as needed for  nausea or vomiting. 08/26/21 08/26/22 Yes [place] Eric J, DO  pantoprazole sodium (PROTONIX) 40 mg Place 40 mg into feeding tube daily. 08/23/21  Yes [place] Donnamarie Poag, DO  phosphorus (K PHOS NEUTRAL) [phone] MG tablet Place 1 tablet (250 mg total) into feeding tube daily. 08/24/21  Yes [place] Eric J, DO  traMADol (ULTRAM) 50 MG tablet Take 50 mg by mouth every 6 (six) hours as needed for moderate pain.   Yes [provider]  Water For Irrigation, Sterile (FREE WATER) SOLN Place 100 mLs into feeding tube every 6 (six) hours. 08/23/21  Yes [place] Donnamarie Poag, DO  Evolocumab with Infusor ([place]) 420 MG/3.5ML SOCT Inject 420 mg into the skin every 30 (thirty) days. Patient not taking: Reported on 09/18/2021 04/26/20   Abigail Butts., [name]  ondansetron (ZOFRAN-ODT) 4 MG disintegrating tablet Take 1 tablet (4 mg total) by mouth every 8 (eight) hours as needed. Patient not taking: Reported on 10/08/2021 06/20/21   Medina-Vargas, Senaida Lange, NP  Water For Irrigation, Sterile (FREE WATER) SOLN Place 120 mLs into feeding tube 5 (five) times daily. Patient not taking: Reported on 10/11/2021 08/23/21   [place] Eric J, DO     Critical care time: 36 mins       Kennieth Rad, ACNP [facility] 09/29/2021, 11:22 AM  See Amion for pager If no response to pager, please call PCCM consult pager After 7:00 pm call [place]

## 2021-09-19 NOTE — ED Provider Notes (Signed)
  Physical Exam  BP 129/79   Pulse (!) 105   Temp 99.7 F (37.6 C)   Resp (!) 22   Ht '5\' 6"'$  (1.676 m)   Wt 77.6 kg   SpO2 99%   BMI 27.61 kg/m   Physical Exam  Procedures  Procedures  ED Course / MDM   Clinical Course as of 10/11/2021 0825  Thu Sep 19, 2021  0504 Patient seen by and discussed with Dr. Tyrone Nine.  Will start with a liter of fluid and continue to monitor.  Full code.  On bipap.  May need intubation if not improving.   [RB]    Clinical Course User Index [RB] Montine Circle, [name]   Medical Decision Making Amount and/or Complexity of Data Reviewed Labs: ordered. Radiology: ordered. ECG/medicine tests: ordered.  Risk OTC drugs. Prescription drug management.   Patient care assumed at shift change from [place]. PA at shift change, please see his note for full HPI.  Briefly, she here with shortness of breath arrived tachycardic, febrile, Neck.  Recent admission for aspiration pneumonia, suspect likely same cause.  Started on antibiotics such as for pain, Flagyl, vancomycin she was also given Tylenol to treat fever.  Prior EDP spoke to Dr. Francia Greaves who recommended repeat BMP, lactic acid to evaluate versus critical care admission.  Repeat VBG showed no acidosis, lactic acid is pending.  Patient is improving clinically with improved vital signs.  Temperature is 99.9, heart rate has improved at 108, respirations 22.  I do feel that call for hospitalist.   8:25 AM Spoke to Dr. Lorin Mercy, appreciate her assitance. She is agreeable of admiting patient for further management.   Portions of this note were generated with L[employer]. Dictation errors may occur despite best attempts at proofreading.        Janeece Fitting, [name] 10/04/2021 Frederich Balding    Carmin Muskrat, MD 10/05/2021 1409

## 2021-09-20 ENCOUNTER — Inpatient Hospital Stay (HOSPITAL_COMMUNITY): Payer: Medicare HMO

## 2021-09-20 DIAGNOSIS — L8992 Pressure ulcer of unspecified site, stage 2: Secondary | ICD-10-CM

## 2021-09-20 DIAGNOSIS — R7881 Bacteremia: Secondary | ICD-10-CM

## 2021-09-20 DIAGNOSIS — B9562 Methicillin resistant Staphylococcus aureus infection as the cause of diseases classified elsewhere: Secondary | ICD-10-CM | POA: Diagnosis not present

## 2021-09-20 DIAGNOSIS — Z7189 Other specified counseling: Secondary | ICD-10-CM

## 2021-09-20 DIAGNOSIS — J9601 Acute respiratory failure with hypoxia: Secondary | ICD-10-CM

## 2021-09-20 DIAGNOSIS — E785 Hyperlipidemia, unspecified: Secondary | ICD-10-CM

## 2021-09-20 DIAGNOSIS — Z515 Encounter for palliative care: Secondary | ICD-10-CM

## 2021-09-20 DIAGNOSIS — I160 Hypertensive urgency: Secondary | ICD-10-CM

## 2021-09-20 DIAGNOSIS — A419 Sepsis, unspecified organism: Secondary | ICD-10-CM | POA: Diagnosis not present

## 2021-09-20 DIAGNOSIS — I248 Other forms of acute ischemic heart disease: Secondary | ICD-10-CM

## 2021-09-20 DIAGNOSIS — N179 Acute kidney failure, unspecified: Secondary | ICD-10-CM

## 2021-09-20 DIAGNOSIS — J690 Pneumonitis due to inhalation of food and vomit: Secondary | ICD-10-CM | POA: Diagnosis not present

## 2021-09-20 DIAGNOSIS — I2510 Atherosclerotic heart disease of native coronary artery without angina pectoris: Secondary | ICD-10-CM

## 2021-09-20 DIAGNOSIS — I5042 Chronic combined systolic (congestive) and diastolic (congestive) heart failure: Secondary | ICD-10-CM

## 2021-09-20 DIAGNOSIS — E1129 Type 2 diabetes mellitus with other diabetic kidney complication: Secondary | ICD-10-CM

## 2021-09-20 DIAGNOSIS — E871 Hypo-osmolality and hyponatremia: Secondary | ICD-10-CM

## 2021-09-20 LAB — BLOOD CULTURE ID PANEL (REFLEXED) - BCID2

## 2021-09-20 LAB — BASIC METABOLIC PANEL
Anion gap: 15 (ref 5–15)
Anion gap: 10 (ref 5–15)
BUN: 50 mg/dL — ABNORMAL HIGH (ref 8–23)
BUN: 49 mg/dL — ABNORMAL HIGH (ref 8–23)
CO2: 27 mmol/L (ref 22–32)
CO2: 27 mmol/L (ref 22–32)
Calcium: 8.4 mg/dL — ABNORMAL LOW (ref 8.9–10.3)
Calcium: 8.3 mg/dL — ABNORMAL LOW (ref 8.9–10.3)
Chloride: 108 mmol/L (ref 98–111)
Chloride: 107 mmol/L (ref 98–111)
Creatinine, Ser: 1.28 mg/dL — ABNORMAL HIGH (ref 0.44–1.00)
Creatinine, Ser: 1.05 mg/dL — ABNORMAL HIGH (ref 0.44–1.00)
GFR, Estimated: 47 mL/min — ABNORMAL LOW (ref >60)
GFR, Estimated: >60 mL/min (ref >60)
Glucose, Bld: 128 mg/dL — ABNORMAL HIGH (ref 70–99)
Glucose, Bld: 157 mg/dL — ABNORMAL HIGH (ref 70–99)
Potassium: 4.4 mmol/L (ref 3.5–5.1)
Potassium: 3.1 mmol/L — ABNORMAL LOW (ref 3.5–5.1)
Sodium: 150 mmol/L — ABNORMAL HIGH (ref 135–145)
Sodium: 144 mmol/L (ref 135–145)

## 2021-09-20 LAB — C DIFFICILE QUICK SCREEN W PCR REFLEX
C Diff antigen: NEGATIVE
C Diff interpretation: NOT DETECTED
C Diff toxin: NEGATIVE

## 2021-09-20 LAB — CBC
HCT: 40.8 % (ref 36.0–46.0)
Hemoglobin: 11.6 g/dL — ABNORMAL LOW (ref 12.0–15.0)
MCH: 28.2 pg (ref 26.0–34.0)
MCHC: 28.4 g/dL — ABNORMAL LOW (ref 30.0–36.0)
MCV: 99 fL (ref 80.0–100.0)
Platelets: 106 10*3/uL — ABNORMAL LOW (ref 150–400)
RBC: 4.12 MIL/uL (ref 3.87–5.11)
RDW: 17.1 % — ABNORMAL HIGH (ref 11.5–15.5)
WBC: 10.9 10*3/uL — ABNORMAL HIGH (ref 4.0–10.5)
nRBC: 0.4 % — ABNORMAL HIGH (ref 0.0–0.2)

## 2021-09-20 LAB — GLUCOSE, CAPILLARY
Glucose-Capillary: 109 mg/dL — ABNORMAL HIGH (ref 70–99)
Glucose-Capillary: 113 mg/dL — ABNORMAL HIGH (ref 70–99)
Glucose-Capillary: 137 mg/dL — ABNORMAL HIGH (ref 70–99)
Glucose-Capillary: 138 mg/dL — ABNORMAL HIGH (ref 70–99)
Glucose-Capillary: 144 mg/dL — ABNORMAL HIGH (ref 70–99)
Glucose-Capillary: 167 mg/dL — ABNORMAL HIGH (ref 70–99)
Glucose-Capillary: 97 mg/dL (ref 70–99)

## 2021-09-20 LAB — MAGNESIUM: Magnesium: 1.9 mg/dL (ref 1.7–2.4)

## 2021-09-20 LAB — URINE CULTURE: Culture: NO GROWTH

## 2021-09-20 LAB — LACTIC ACID, PLASMA
Lactic Acid, Venous: 3.8 mmol/L (ref 0.5–1.9)
Lactic Acid, Venous: 4.2 mmol/L (ref 0.5–1.9)
Lactic Acid, Venous: 2.5 mmol/L (ref 0.5–1.9)

## 2021-09-20 LAB — PHOSPHORUS: Phosphorus: 4.1 mg/dL (ref 2.5–4.6)

## 2021-09-20 LAB — MRSA NEXT GEN BY PCR, NASAL: MRSA by PCR Next Gen: DETECTED — AB

## 2021-09-20 MED ORDER — PROSOURCE TF PO LIQD
45.0000 mL | Freq: Four times a day (QID) | ORAL | Status: DC
  Administered 2021-09-20 – 2021-09-22 (×7): 45 mL
  Filled 2021-09-20 (×5): qty 45

## 2021-09-20 MED ORDER — JEVITY 1.5 CAL/FIBER PO LIQD
237.0000 mL | Freq: Every day | ORAL | Status: DC
  Administered 2021-09-20 – 2021-09-22 (×7): 237 mL
  Filled 2021-09-20 (×12): qty 237

## 2021-09-20 MED ORDER — GERHARDT'S BUTT CREAM
TOPICAL_CREAM | Freq: Four times a day (QID) | CUTANEOUS | Status: DC
  Administered 2021-09-20 – 2021-09-21 (×2): 1 via TOPICAL
  Filled 2021-09-20 (×2): qty 1

## 2021-09-20 MED ORDER — JUVEN PO PACK
1.0000 | PACK | Freq: Two times a day (BID) | ORAL | Status: DC
  Administered 2021-09-20 – 2021-09-22 (×4): 1
  Filled 2021-09-20 (×4): qty 1

## 2021-09-20 MED ORDER — DEXTROSE 5 % IV SOLN: INTRAVENOUS | Status: DC

## 2021-09-20 MED ORDER — FREE WATER
150.0000 mL | Status: DC
  Administered 2021-09-21 – 2021-09-22 (×9): 150 mL

## 2021-09-20 MED ORDER — IOHEXOL 300 MG/ML  SOLN
80.0000 mL | Freq: Once | INTRAMUSCULAR | Status: AC | PRN
  Administered 2021-09-20: 80 mL via INTRAVENOUS

## 2021-09-20 NOTE — Progress Notes (Signed)
PROGRESS NOTE    [name] [name]  XAJ:[id number] DOB: [birth year] DOA: 09/20/2021 PCP: [name] Cha, MD     Brief Narrative:  [name] [name] is a 61 y.o. female with medical history significant of CAD s/p CABG; depression/anxiety; HTN; HLD; SLE; psoriasis with psoriatic arthritis; RA; OSA not on CPAP; CVA; esophageal dysmotility/dysphagia; chronic systolic CHF; and DM presenting with CP, hypoxia to 87%.    She was last admitted from 5/22-6/13 with aspiration PNA and ESBL E coli UTI with bacteremia.  She was treated with Meropenem -> Augmentin and was able to wean off O2.  She also had a large R-sided pleural effusion s/p thoracentesis.  She has had persistent hematemesis since and was seen for this in the ER on 6/27.    Patient was admitted with sepsis secondary to suspected aspiration pneumonia.  She required BiPAP on admission.  PCCM was consulted.  Patient started on broad-spectrum antibiotics.  New events last 24 hours / Subjective: Patient seen in the ICU.  She is off BiPAP and remains on 5 L nasal cannula O2. Her main complaint is having a sore buttock, has some excoriation around her anus from sitting in stool  Assessment & Plan:   Principal Problem:   MRSA bacteremia Active Problems:   Hyperlipidemia with target LDL less than 70   Accelerated hypertension   Insulin-requiring or dependent type II diabetes mellitus (HCC)   Pressure injury of skin   CAD (coronary artery disease) of artery bypass graft   AKI (acute kidney injury) ([place])   Chronic combined systolic and diastolic CHF (congestive heart failure) (HCC)   Dysphagia   Severe sepsis (HCC)   Aspiration pneumonia (HCC)   Dehydration with hypernatremia   Respiratory failure with hypoxia (HCC)   Demand ischemia (HCC)   Severe sepsis secondary to MRSA bacteremia, sepsis present on admission -Sepsis present on admission with fever 102.7, tachycardia 130s, tachypnea 20s, respiratory failure and lactic acid peak of  5.0 -Infectious disease following -Echocardiogram, CT abdomen pelvis ordered -Continue vancomycin  Hypernatremia -Continue free water flushes via PEG, D5 IV fluid and monitor BMP  Oropharyngeal dysphagia, esophageal dysmotility -Major contributor to recurrent aspiration and aspiration pneumonitis -N.p.o.  Diarrhea -Check C Diff   AKI  -Baseline creatinine 0.5 -Continue IV fluid  Demand ischemia -Troponin 98, 111 -Insetting of sepsis  CAD status post CABG -Aspirin  Hypertension  -Lopressor on hold  Hyperlipidemia -Lipitor  Chronic combined systolic and diastolic heart failure -Echo on 05/01/21 was 45% with grade 1 diastolic dysfunction -Does not appear to be fluid overloaded, but rather dehydrated  Diabetes mellitus -A1c 6.7 -Sliding scale insulin  History of lupus/rheumatoid arthritis/psoriasis -Does not appear to be on immunosuppressants prior to admission  OSA -Not on CPAP  Goals of care -Palliative care consulted   In agreement with assessment of the pressure ulcer as below:  Pressure Injury 10/09/2021 Buttocks Bilateral Stage 2 -  Partial thickness loss of dermis presenting as a shallow open injury with a red, pink wound bed without slough. (Active)  09/26/2021 1900  Location: Buttocks  Location Orientation: Bilateral  Staging: Stage 2 -  Partial thickness loss of dermis presenting as a shallow open injury with a red, pink wound bed without slough.  Wound Description (Comments):   Present on Admission: Yes  Dressing Type Foam - Lift dressing to assess site every shift 09/20/21 0800     Pressure Injury 09/14/2021 Sacrum Stage 2 -  Partial thickness loss of dermis presenting as a shallow open  injury with a red, pink wound bed without slough. (Active)  10/04/2021 1900  Location: Sacrum  Location Orientation:   Staging: Stage 2 -  Partial thickness loss of dermis presenting as a shallow open injury with a red, pink wound bed without slough.  Wound Description  (Comments):   Present on Admission: Yes     Pressure Injury 10/07/2021 Heel Right Stage 1 -  Intact skin with non-blanchable redness of a localized area usually over a bony prominence. (Active)  09/24/2021 1900  Location: Heel  Location Orientation: Right  Staging: Stage 1 -  Intact skin with non-blanchable redness of a localized area usually over a bony prominence.  Wound Description (Comments):   Present on Admission:   Dressing Type Foam - Lift dressing to assess site every shift 09/20/21 0800     Pressure Injury 10/11/2021 Heel Left Deep Tissue Pressure Injury - Purple or maroon localized area of discolored intact skin or blood-filled blister due to damage of underlying soft tissue from pressure and/or shear. (Active)  09/28/2021 1900  Location: Heel  Location Orientation: Left  Staging: Deep Tissue Pressure Injury - Purple or maroon localized area of discolored intact skin or blood-filled blister due to damage of underlying soft tissue from pressure and/or shear.  Wound Description (Comments):   Present on Admission: Yes  Dressing Type Foam - Lift dressing to assess site every shift 09/20/21 0800         DVT prophylaxis:  enoxaparin (LOVENOX) injection 40 mg Start: 09/16/2021 1145  Code Status: Full Family Communication: Updated son [name] over the phone today  Disposition Plan:  Status is: Inpatient Remains inpatient appropriate because: IV antibiotics and work up   Consultants:  PCCM Cardiology    Antimicrobials:  Anti-infectives (From admission, onward)    Start     Dose/Rate Route Frequency Ordered Stop   09/20/21 0700  vancomycin (VANCOCIN) IVPB 1000 mg/200 mL premix        1,000 mg 200 mL/hr over 60 Minutes Intravenous Every 24 hours 10/04/2021 1110     10/07/2021 1800  metroNIDAZOLE (FLAGYL) IVPB 500 mg  Status:  Discontinued        500 mg 100 mL/hr over 60 Minutes Intravenous Every 12 hours 09/22/2021 1042 10/08/2021 1118   09/30/2021 1700  ceFEPIme (MAXIPIME) 2 g in sodium  chloride 0.9 % 100 mL IVPB  Status:  Discontinued        2 g 200 mL/hr over 30 Minutes Intravenous Every 12 hours 10/06/2021 1047 09/20/21 0905   10/06/2021 1400  ceFEPIme (MAXIPIME) 2 g in sodium chloride 0.9 % 100 mL IVPB  Status:  Discontinued        2 g 200 mL/hr over 30 Minutes Intravenous Every 8 hours 10/01/2021 1042 10/06/2021 1047   10/12/2021 0515  vancomycin (VANCOREADY) IVPB 1500 mg/300 mL        1,500 mg 150 mL/hr over 120 Minutes Intravenous  Once 09/15/2021 0506 09/27/2021 0925   09/14/2021 0500  ceFEPIme (MAXIPIME) 2 g in sodium chloride 0.9 % 100 mL IVPB        2 g 200 mL/hr over 30 Minutes Intravenous  Once 10/12/2021 0453 09/30/2021 0638   09/18/2021 0500  metroNIDAZOLE (FLAGYL) IVPB 500 mg        500 mg 100 mL/hr over 60 Minutes Intravenous  Once 10/10/2021 0453 09/29/2021 0710   09/26/2021 0500  vancomycin (VANCOCIN) IVPB 1000 mg/200 mL premix  Status:  Discontinued        1,000 mg 200  mL/hr over 60 Minutes Intravenous  Once 09/28/2021 0453 10/06/2021 0503        Objective: Vitals:   09/20/21 0800 09/20/21 0900 09/20/21 1000 09/20/21 1145  BP: 113/72 107/61 115/75 107/66  Pulse: 84 85  93  Resp: 12 (!) '21 19 19  '$ Temp:    98.3 F (36.8 C)  TempSrc:    Oral  SpO2: 97% 90%  92%  Weight:      Height:        Intake/Output Summary (Last 24 hours) at 09/20/2021 1224 Last data filed at 09/20/2021 1200 Gross per 24 hour  Intake 2945.34 ml  Output 700 ml  Net 2245.34 ml   Filed Weights   09/27/2021 0443 09/20/21 0500  Weight: 77.6 kg 68.2 kg    Examination:  General exam: Appears uncomfortable  Respiratory system: Clear to auscultation anteriorly. Respiratory effort normal. No respiratory distress. On 5L O2  Cardiovascular system: S1 & S2 heard, RRR. No murmurs.  Gastrointestinal system: Abdomen is nondistended, soft and nontender. +PEG tube site with some purulence  Central nervous system: Alert  Extremities: Symmetric in appearance  Skin: Excoriation around her anus  Data Reviewed: I  have personally reviewed following labs and imaging studies  CBC: Recent Labs  Lab 09/29/2021 0458 09/30/2021 0742 09/20/21 0423  WBC 6.3  --  10.9*  NEUTROABS 5.7  --   --   HGB 14.4 9.9* 11.6*  HCT 50.2* 29.0* 40.8  MCV 98.4  --  99.0  PLT 204  --  671*   Basic Metabolic Panel: Recent Labs  Lab 09/26/2021 0458 09/18/2021 0742 10/01/2021 0952 09/20/21 0423  NA 154* 156* 152* 150*  K 4.6 4.0 4.5 4.4  CL 111  --  115* 108  CO2 35*  --  30 27  GLUCOSE 132*  --  103* 128*  BUN 49*  --  47* 50*  CREATININE 1.30*  --  1.25* 1.28*  CALCIUM 9.3  --  8.1* 8.4*  MG  --   --   --  1.9  PHOS  --   --   --  4.1   GFR: Estimated Creatinine Clearance: 42.7 mL/min (A) (by C-G formula based on SCr of 1.28 mg/dL (H)). Liver Function Tests: Recent Labs  Lab 09/26/2021 0458  AST 77*  ALT 24  ALKPHOS 108  BILITOT 0.6  PROT 9.8*  ALBUMIN 1.8*   No results for input(s): "LIPASE", "AMYLASE" in the last 168 hours. No results for input(s): "AMMONIA" in the last 168 hours. Coagulation Profile: Recent Labs  Lab 10/03/2021 0458  INR 1.2   Cardiac Enzymes: No results for input(s): "CKTOTAL", "CKMB", "CKMBINDEX", "TROPONINI" in the last 168 hours. BNP (last 3 results) No results for input(s): "PROBNP" in the last 8760 hours. HbA1C: No results for input(s): "HGBA1C" in the last 72 hours. CBG: Recent Labs  Lab 10/09/2021 2000 09/20/21 0005 09/20/21 0350 09/20/21 0801 09/20/21 1151  GLUCAP 95 109* 137* 144* 138*   Lipid Profile: No results for input(s): "CHOL", "HDL", "LDLCALC", "TRIG", "CHOLHDL", "LDLDIRECT" in the last 72 hours. Thyroid Function Tests: No results for input(s): "TSH", "T4TOTAL", "FREET4", "T3FREE", "THYROIDAB" in the last 72 hours. Anemia Panel: No results for input(s): "VITAMINB12", "FOLATE", "FERRITIN", "TIBC", "IRON", "RETICCTPCT" in the last 72 hours. Sepsis Labs: Recent Labs  Lab 09/21/2021 0952 09/14/2021 0953 09/14/2021 1223 09/20/21 0800 09/20/21 0948   PROCALCITON 0.47  --   --   --   --   LATICACIDVEN  --  2.2* 5.0* 3.8*  4.2*    Recent Results (from the past 240 hour(s))  Blood Culture (routine x 2)     Status: None   Collection Time: 09/10/21 12:54 PM   Specimen: BLOOD  Result Value Ref Range Status   Specimen Description BLOOD BLOOD RIGHT HAND  Final   Special Requests   Final    BOTTLES DRAWN AEROBIC AND ANAEROBIC Blood Culture results may not be optimal due to an inadequate volume of blood received in culture bottles   Culture   Final    NO GROWTH 5 DAYS Performed at [facility], [place] [address], [place], [place] [address]    Report Status 09/15/2021 FINAL  Final  Blood Culture (routine x 2)     Status: None   Collection Time: 09/10/21  1:39 PM   Specimen: BLOOD  Result Value Ref Range Status   Specimen Description BLOOD BLOOD LEFT HAND  Final   Special Requests   Final    BOTTLES DRAWN AEROBIC AND ANAEROBIC Blood Culture results may not be optimal due to an inadequate volume of blood received in culture bottles   Culture   Final    NO GROWTH 5 DAYS Performed at [facility], [place] [address], [place], [place] [address]    Report Status 09/15/2021 FINAL  Final  Blood Culture (routine x 2)     Status: None (Preliminary result)   Collection Time: 10/04/2021  4:58 AM   Specimen: BLOOD  Result Value Ref Range Status   Specimen Description BLOOD LEFT ANTECUBITAL  Final   Special Requests   Final    BOTTLES DRAWN AEROBIC AND ANAEROBIC Blood Culture results may not be optimal due to an excessive volume of blood received in culture bottles   Culture   Final    NO GROWTH 1 DAY Performed at [facility], [place] [address], [place], [place] [address]    Report Status PENDING  Incomplete  Blood Culture (routine x 2)     Status: None (Preliminary result)   Collection Time: 09/16/2021  5:41 AM   Specimen: BLOOD  Result Value Ref Range Status   Specimen Description BLOOD SITE NOT SPECIFIED  Final   Special Requests    Final    BOTTLES DRAWN AEROBIC AND ANAEROBIC Blood Culture adequate volume   Culture  Setup Time   Final    GRAM POSITIVE COCCI AEROBIC BOTTLE ONLY CRITICAL RESULT CALLED TO, READ BACK BY AND VERIFIED WITH: T RUDISILL,PHARMD'@0605'$  09/20/21 [place] Performed at [facility], [place] [address], [place], [place] [address]    Culture GRAM POSITIVE COCCI  Final   Report Status PENDING  Incomplete  Blood Culture ID Panel (Reflexed)     Status: Abnormal   Collection Time: 10/11/2021  5:41 AM  Result Value Ref Range Status   Enterococcus faecalis NOT DETECTED NOT DETECTED Final   Enterococcus Faecium NOT DETECTED NOT DETECTED Final   Listeria monocytogenes NOT DETECTED NOT DETECTED Final   Staphylococcus species DETECTED (A) NOT DETECTED Final    Comment: CRITICAL RESULT CALLED TO, READ BACK BY AND VERIFIED WITH: T RUDISILL,PHARMD'@0605'$  09/20/21 [place]    Staphylococcus aureus (BCID) DETECTED (A) NOT DETECTED Final    Comment: Methicillin (oxacillin)-resistant Staphylococcus aureus (MRSA). MRSA is predictably resistant to beta-lactam antibiotics (except ceftaroline). Preferred therapy is vancomycin unless clinically contraindicated. Patient requires contact precautions if  hospitalized. CRITICAL RESULT CALLED TO, READ BACK BY AND VERIFIED WITH: T RUDISILL,PHARMD'@0605'$  09/20/21 [place]    Staphylococcus epidermidis NOT  DETECTED NOT DETECTED Final   Staphylococcus lugdunensis NOT DETECTED NOT DETECTED Final   Streptococcus species NOT DETECTED NOT DETECTED Final   Streptococcus agalactiae NOT DETECTED NOT DETECTED Final   Streptococcus pneumoniae NOT DETECTED NOT DETECTED Final   Streptococcus pyogenes NOT DETECTED NOT DETECTED Final   A.calcoaceticus-baumannii NOT DETECTED NOT DETECTED Final   Bacteroides fragilis NOT DETECTED NOT DETECTED Final   Enterobacterales NOT DETECTED NOT DETECTED Final   Enterobacter cloacae complex NOT DETECTED NOT DETECTED Final   Escherichia coli NOT DETECTED NOT DETECTED  Final   Klebsiella aerogenes NOT DETECTED NOT DETECTED Final   Klebsiella oxytoca NOT DETECTED NOT DETECTED Final   Klebsiella pneumoniae NOT DETECTED NOT DETECTED Final   Proteus species NOT DETECTED NOT DETECTED Final   Salmonella species NOT DETECTED NOT DETECTED Final   Serratia marcescens NOT DETECTED NOT DETECTED Final   Haemophilus influenzae NOT DETECTED NOT DETECTED Final   Neisseria meningitidis NOT DETECTED NOT DETECTED Final   Pseudomonas aeruginosa NOT DETECTED NOT DETECTED Final   Stenotrophomonas maltophilia NOT DETECTED NOT DETECTED Final   Candida albicans NOT DETECTED NOT DETECTED Final   Candida auris NOT DETECTED NOT DETECTED Final   Candida glabrata NOT DETECTED NOT DETECTED Final   Candida krusei NOT DETECTED NOT DETECTED Final   Candida parapsilosis NOT DETECTED NOT DETECTED Final   Candida tropicalis NOT DETECTED NOT DETECTED Final   Cryptococcus neoformans/gattii NOT DETECTED NOT DETECTED Final   Meth resistant mecA/C and MREJ DETECTED (A) NOT DETECTED Final    Comment: CRITICAL RESULT CALLED TO, READ BACK BY AND VERIFIED WITH: T RUDISILL,PHARMD'@0605'$  09/20/21 [place] Performed at [facility], [address], [place], [place] 09/16/2021   Urine Culture     Status: None   Collection Time: 10/03/2021  8:03 AM   Specimen: In/Out Cath Urine  Result Value Ref Range Status   Specimen Description IN/OUT CATH URINE  Final   Special Requests NONE  Final   Culture   Final    NO GROWTH Performed at [facility], [place] [address], [place], [place] [address]    Report Status 09/20/2021 FINAL  Final      Radiology Studies: DG Chest Port 1 View  Result Date: 10/02/2021 CLINICAL DATA:  Questionable sepsis EXAM: PORTABLE CHEST 1 VIEW COMPARISON:  09/10/2021 FINDINGS: Chronic cardiomegaly. Prior CABG. Infiltrate at the left lung base and right mid chest. Trace pleural effusions. Chronic hyperinflation. IMPRESSION: Continued infiltrates in the left lower and right mid  chest. Stable trace pleural effusion on the right. Electronically Signed   By: Jorje Guild M.D.   On: 10/12/2021 05:17      Scheduled Meds:  aspirin  81 mg Per Tube Daily   atorvastatin  80 mg Per Tube Daily   Chlorhexidine Gluconate Cloth  6 each Topical Daily   enoxaparin (LOVENOX) injection  40 mg Subcutaneous Daily   escitalopram  10 mg Per Tube QHS   feeding supplement (PROSource TF)  45 mL Per Tube BID   feeding supplement (VITAL HIGH PROTEIN)  1,000 mL Per Tube T51V   folic acid  1 mg Per Tube Daily   free water  200 mL Per Tube Q2H   Gerhardt's butt cream   Topical QID   insulin aspart  0-9 Units Subcutaneous Q4H   mouth rinse  15 mL Mouth Rinse 4 times per day   pantoprazole sodium  40 mg Per Tube Daily   phosphorus  250 mg Per Tube Daily  sodium chloride flush  3 mL Intravenous Q12H   Continuous Infusions:  dextrose 50 mL/hr at 09/20/21 1200   vancomycin Stopped (09/20/21 0658)     LOS: 1 day     Dessa Phi, DO [facility] 09/20/2021, 12:24 PM   Available via Epic secure chat 7am-7pm After these hours, please refer to coverage provider listed on [email or web address]

## 2021-09-20 NOTE — Consult Note (Addendum)
[facility]ious Disease    Date of Admission:  09/26/2021     Reason for Consult:MRSA Bacteremia     Referring Physician: CHAMP Auto Consult  Current antibiotics: Vancomycin Cefepime MTZ   ASSESSMENT:    61 y.o. female admitted with:  MRSA bacteremia:  Patient here with sepsis and blood cultures growing MRSA.  Potential source includes PEG tube site which appears erythematous and purulent.  Other etiology could be sacral pressure ulcer with excoriations vs recurrent pneumonia given initial hypoxia and previous history of aspiration, however, her imaging appears overall stable/unchanged and she is improved rapidly from an O2 requirement standpoint. Sepsis: Due to #1. History of recurrent pneumonia: In the setting of dysphagia and esophageal dysmotility.  Status post PEG tube placement 08/20/21. Acute kidney injury: Admitted with creatinine 1.3 in the setting of sepsis/dehydration.  Stable today. Pressure ulcer: Evaluated by WOC for stage 2 pressure injury, partial thickness with surrounding excoriation. Diarrhea:  C diff has been ordered.  RECOMMENDATIONS:    Continue vancomycin Stop cefepime and MTZ Repeat blood cultures tomorrow morning TTE CT abd/pelvis with contrast to evaluate for PEG tube complication/abscess Follow up C diff testing Wean oxygen Aspiration precautions, NPO Wound care Lab monitoring Will follow.  Dr Gale Journey here as needed over the weekend.   Principal Problem:   Sepsis due to pneumonia [facility]) Active Problems:   Accelerated hypertension   Insulin-requiring or dependent type II diabetes mellitus ([place])   Hyperlipidemia with target LDL less than 70   CAD (coronary artery disease) of artery bypass graft   Chronic combined systolic and diastolic CHF (congestive heart failure) (HCC)   Aspiration pneumonia (HCC)   Dehydration with hypernatremia   Respiratory failure with hypoxia (HCC)   MEDICATIONS:    Scheduled Meds:  aspirin  81 mg Per  Tube Daily   atorvastatin  80 mg Per Tube Daily   Chlorhexidine Gluconate Cloth  6 each Topical Daily   enoxaparin (LOVENOX) injection  40 mg Subcutaneous Daily   escitalopram  10 mg Per Tube QHS   feeding supplement (PROSource TF)  45 mL Per Tube BID   feeding supplement (VITAL HIGH PROTEIN)  1,000 mL Per Tube I96V   folic acid  1 mg Per Tube Daily   free water  200 mL Per Tube Q2H   insulin aspart  0-9 Units Subcutaneous Q4H   mouth rinse  15 mL Mouth Rinse 4 times per day   pantoprazole sodium  40 mg Per Tube Daily   phosphorus  250 mg Per Tube Daily   sodium chloride flush  3 mL Intravenous Q12H   Continuous Infusions:  ceFEPime (MAXIPIME) IV Stopped (09/15/2021 1839)   dextrose     vancomycin 1,000 mg (09/20/21 0557)   PRN Meds:.acetaminophen **OR** acetaminophen, albuterol, bisacodyl, bisacodyl, guaiFENesin, hydrALAZINE, mouth rinse, polyethylene glycol, white petrolatum  HPI:    [name] [name] is a 61 y.o. female with multiple medical problems and recurrent hospital admissions due to recurrent pneumonia was admitted yesterday from [facility] with complaints of weakness, chest pain, SOB and reported hypoxia.  In the ED she was found to be febrile and tachypneic.  Upon evaluation by PCCM in the ED, she was on BiPap with adequate work of breathing and satting well on this.  Her oxygen was titrated down and this morning she is on nasal cannula and saturating well.  PCCM notes that her CXR looks relatively unchanged from prior and CTA chest showed scattered perifissural and tree  in bud opacities in both upper and lower lobes hila suggestive of pneumonia and bronchiolitis, likely infectious or inflammatory.  She was started on broad spectrum antibiotics overnight.  During a recent admission, she had a PEG tube placed on 08/20/21 for recurrent aspiration pneumonia and dysphagia.  Since that time, she has been NPO and uses PEG for feedings and medications.  However, she does have oral  secretions that she has difficulty clearing and coughs frequently.  It was noted on admission that the site of her PEG was erythematous and overall appeared dirty.  Blood cultures from admission have detected MRSA.   This morning, in addition to the above, she reports feeling achy and terrible.  Her backside hurts where RN has noted a stage II pressure ulcer and excoriation.    Past Medical History:  Diagnosis Date   Anxiety and depression    CAD (coronary artery disease)    a. CABG 06/2015: LIMA-LAD, SVG-RCA, SVG-OM2 b. STEMI s/p PCI to distal RCA lesion with a small Promus Premier stent. ( patent LIMA to the LAD, occluded SVG to OM, occluded SVG to RCA.)   Empty sella (HCC)    GERD (gastroesophageal reflux disease)    Hidradenitis axillaris    History of kidney stones    Hyperlipemia    Hypertension    Lupus ([place]) dx'd 2008   "they think I have this; have to get retested" (06/12/2015)   PONV (postoperative nausea and vomiting)    likes iv med or scopolamine patch works well   Psoriasis 06/15/2020   hands elbows, ears, small amount on scalp and bridge of nose   Rheumatoid aortitis    psorriatic arthritis   Rheumatoid arthritis ([place])    S/P angioplasty with stent; 10/04/15 to distal RCA lesion. with Promus premier. 10/08/2015   Scoliosis    Sleep apnea    patient states "it is mild" no cpap needed   Stroke ([place]) 05/2012   left MCA branches; "they say I have them all the time", denies residual on 06/12/2015   Tobacco abuse    Type II diabetes mellitus (HCC)    Urinary frequency    and urgency   Wears glasses    for reading    Social History   Tobacco Use   Smoking status: Former    Packs/day: 0.50    Years: 36.00    Total pack years: 18.00    Types: Cigarettes    Quit date: 06/12/2014    Years since quitting: 7.2   Smokeless tobacco: Never  Vaping Use   Vaping Use: Never used  Substance Use Topics   Alcohol use: No    Alcohol/week: 0.0 standard drinks of alcohol    Drug use: No    Family History  Problem Relation Age of Onset   Diabetes Mother    Heart failure Mother    Breast cancer Mother        17s   Stroke Maternal Aunt    Depression Son    Diabetes Other        maternal   Breast cancer Other        2 diff great maternal aunts -  both 46s   Alzheimer's disease Other        maternal    Allergies  Allergen Reactions   Apremilast Other (See Comments)    unknown   Latex Itching, Swelling and Rash    Review of Systems  All other systems reviewed and are negative.  Except as  otherwise noted above.  OBJECTIVE:   Blood pressure 119/87, pulse 86, temperature 98.6 F (37 C), temperature source Bladder, resp. rate (!) 7, height '5\' 6"'$  (1.676 m), weight 68.2 kg, SpO2 99 %. Body mass index is 24.27 kg/m.  Physical Exam Constitutional:      Comments: She is chronically ill appearing, NAD, lying on her left side in the ICU.   HENT:     Head: Normocephalic and atraumatic.     Mouth/Throat:     Comments: Dentition is poor.  Eyes:     Extraocular Movements: Extraocular movements intact.     Conjunctiva/sclera: Conjunctivae normal.  Cardiovascular:     Rate and Rhythm: Normal rate and regular rhythm.  Pulmonary:     Effort: Pulmonary effort is normal. No respiratory distress.     Comments: Bilateral breath sounds are coarse and diminished at bases.  Abdominal:     General: There is no distension.     Palpations: Abdomen is soft.     Tenderness: There is abdominal tenderness.     Comments: PEG tube site is erythematous with purulent looking debris.   Musculoskeletal:     Cervical back: Normal range of motion and neck supple.     Right lower leg: No edema.     Left lower leg: No edema.  Skin:    General: Skin is warm and dry.     Findings: No rash.  Neurological:     General: No focal deficit present.     Mental Status: She is oriented to person, place, and time.  Psychiatric:        Mood and Affect: Mood normal.         Behavior: Behavior normal.      Lab Results: Lab Results  Component Value Date   WBC 10.9 (H) 09/20/2021   HGB 11.6 (L) 09/20/2021   HCT 40.8 09/20/2021   MCV 99.0 09/20/2021   PLT 106 (L) 09/20/2021    Lab Results  Component Value Date   NA 150 (H) 09/20/2021   K 4.4 09/20/2021   CO2 27 09/20/2021   GLUCOSE 128 (H) 09/20/2021   BUN 50 (H) 09/20/2021   CREATININE 1.28 (H) 09/20/2021   CALCIUM 8.4 (L) 09/20/2021   GFRNONAA 47 (L) 09/20/2021   GFRAA >60 12/12/2019    Lab Results  Component Value Date   ALT 24 09/29/2021   AST 77 (H) 09/29/2021   ALKPHOS 108 10/13/2021   BILITOT 0.6 09/26/2021       Component Value Date/Time   CRP 31.0 (H) 08/05/2021 2218       Component Value Date/Time   ESRSEDRATE 54 (H) 08/06/2021 0011    I have reviewed the micro and lab results in Epic.  Imaging: DG Chest Port 1 View  Result Date: 09/29/2021 CLINICAL DATA:  Questionable sepsis EXAM: PORTABLE CHEST 1 VIEW COMPARISON:  09/10/2021 FINDINGS: Chronic cardiomegaly. Prior CABG. Infiltrate at the left lung base and right mid chest. Trace pleural effusions. Chronic hyperinflation. IMPRESSION: Continued infiltrates in the left lower and right mid chest. Stable trace pleural effusion on the right. Electronically Signed   By: Jorje Guild M.D.   On: 09/29/2021 05:17     Imaging independently reviewed in Epic.  [facility] for Infectious Disease [facility] [phone] pager 09/20/2021, 7:53 AM

## 2021-09-20 NOTE — Progress Notes (Signed)
PHARMACY - PHYSICIAN COMMUNICATION CRITICAL VALUE ALERT - BLOOD CULTURE IDENTIFICATION (BCID)  [name] [name] is an 61 y.o. female who presented to [facility] on 10/05/2021 with a chief complaint of acute on chronic hypoxemic and hypercarbic respiratory failure.  Assessment:  1 of 4 MRSA    Name of physician (or Provider) Contacted: S Sommer  Current antibiotics: Vancomycin + cefepime + flagyl  Changes to prescribed antibiotics recommended: de-escalate cefepime Recommendations declined by provider - infection may be polymicrobial  Results for orders placed or performed during the hospital encounter of 09/17/2021  Blood Culture ID Panel (Reflexed) (Collected: 09/21/2021  5:41 AM)  Result Value Ref Range   Enterococcus faecalis NOT DETECTED NOT DETECTED   Enterococcus Faecium NOT DETECTED NOT DETECTED   Listeria monocytogenes NOT DETECTED NOT DETECTED   Staphylococcus species DETECTED (A) NOT DETECTED   Staphylococcus aureus (BCID) DETECTED (A) NOT DETECTED   Staphylococcus epidermidis NOT DETECTED NOT DETECTED   Staphylococcus lugdunensis NOT DETECTED NOT DETECTED   Streptococcus species NOT DETECTED NOT DETECTED   Streptococcus agalactiae NOT DETECTED NOT DETECTED   Streptococcus pneumoniae NOT DETECTED NOT DETECTED   Streptococcus pyogenes NOT DETECTED NOT DETECTED   A.calcoaceticus-baumannii NOT DETECTED NOT DETECTED   Bacteroides fragilis NOT DETECTED NOT DETECTED   Enterobacterales NOT DETECTED NOT DETECTED   Enterobacter cloacae complex NOT DETECTED NOT DETECTED   Escherichia coli NOT DETECTED NOT DETECTED   Klebsiella aerogenes NOT DETECTED NOT DETECTED   Klebsiella oxytoca NOT DETECTED NOT DETECTED   Klebsiella pneumoniae NOT DETECTED NOT DETECTED   Proteus species NOT DETECTED NOT DETECTED   Salmonella species NOT DETECTED NOT DETECTED   Serratia marcescens NOT DETECTED NOT DETECTED   Haemophilus influenzae NOT DETECTED NOT DETECTED   Neisseria meningitidis NOT DETECTED  NOT DETECTED   Pseudomonas aeruginosa NOT DETECTED NOT DETECTED   Stenotrophomonas maltophilia NOT DETECTED NOT DETECTED   Candida albicans NOT DETECTED NOT DETECTED   Candida auris NOT DETECTED NOT DETECTED   Candida glabrata NOT DETECTED NOT DETECTED   Candida krusei NOT DETECTED NOT DETECTED   Candida parapsilosis NOT DETECTED NOT DETECTED   Candida tropicalis NOT DETECTED NOT DETECTED   Cryptococcus neoformans/gattii NOT DETECTED NOT DETECTED   Meth resistant mecA/C and MREJ DETECTED (A) NOT DETECTED    [name] [name] 09/20/2021  6:15 AM

## 2021-09-20 NOTE — Consult Note (Signed)
[facility] Nurse Consult Note: Patient receiving care in [facility] [phone] Consult completed remotely after review of chart and flowsheet Reason for Consult: pressure ulcer Wound type: Stage 2 Pressure injury, partial thickness with surrounding excoriation Right heel-stage 1 Left heel-DTPI Pressure Injury POA: Yes Measurement: see flowsheet Wound bed: red, yellow Dressing procedure/placement/frequency: Clean the sacral area with no rinse cleanser. Pat dry and place a cut to fit piece of Xeroform gauze over open wound and secure with foam dressing. Change Xeroform daily. May change sacral foam dressing every 3 days or PRN soiling.  Place feet in Prevalon boots and turn patient every 2 hours.  Assess these areas each shift and re-consult if any evolution of wounds.  Monitor the wound area(s) for worsening of condition such as: Signs/symptoms of infection, increase in size, development of or worsening of odor, development of pain, or increased pain at the affected locations.   Notify the medical team if any of these develop.  Thank you for the consult. [facility] nurse will not follow at this time.   Please re-consult the [facility] team if needed.  Cathlean Marseilles Tamala Julian, MSN, RN, [place], Lysle Pearl, [facility] Wound Treatment Associate Pager [phone]

## 2021-09-20 NOTE — Progress Notes (Signed)
Initial Nutrition Assessment  DOCUMENTATION CODES:   Non-severe (moderate) malnutrition in context of chronic illness  INTERVENTION:   Bolus Tube Feeds via PEG: 1 carton Jevity 1.5 - 5 times per day Increase 45 mL ProSource TF - QID 50 mL free water flush before and after each bolus + 100 mL free water flush - QID or per MD Provides 1938 kcal, 120 gm protein, 1800 mL free water flush (TF + flushes) daily. 1 packet Juven BID, each packet provides 95 calories, 2.5 grams of protein (collagen), and 9.8 grams of carbohydrate (3 grams sugar); also contains 7 grams of L-arginine and L-glutamine, 300 mg vitamin C, 15 mg vitamin E, 1.2 mcg vitamin B-12, 9.5 mg zinc, 200 mg calcium, and 1.5 g  Calcium Beta-hydroxy-Beta-methylbutyrate to support wound healing  NUTRITION DIAGNOSIS:   Moderate Malnutrition related to chronic illness (CHF, dysphagia) as evidenced by severe muscle depletion, moderate fat depletion.  GOAL:   Patient will meet greater than or equal to 90% of their needs  MONITOR:   Labs, Weight trends, TF tolerance, Skin  REASON FOR ASSESSMENT:   Consult Assessment of nutrition requirement/status, Enteral/tube feeding initiation and management  ASSESSMENT:   61 y.o. female presented [facility] ED from SNF with increased weakness, SOB, and chest pain. PMH includes CHF, HTN, T2DM, FTT, GERD, Lupus, and esophageal dysmotility s/p PEG. Pt admitted with sepsis 2/2 aspiration pneumonia.   Home TF regimen is 237 mL Jevity 1.5 - Five times per day with 45 mL ProSource TF - BID.   Pt resting in bed. Reports that she is tired and that she wants to sleep. States that when she eats, she coughs and then throws up. Per H&P, pt has not had any PO since PEG was placed on 08/20/21. Pt unable to answer about any weight loss. Per EMR, pt weight has been stable. Pt reports that she uses a walker to ambulate.  Medications reviewed and include: Folic acid, NovoLog,Protonix, Phosphorus, IV antibiotics   Labs reviewed: Sodium 150, BUN 50, Creatinine 1.28  NUTRITION - FOCUSED PHYSICAL EXAM:  Flowsheet Row Most Recent Value  Orbital Region Moderate depletion  Upper Arm Region Moderate depletion  Thoracic and Lumbar Region Moderate depletion  Buccal Region Moderate depletion  Temple Region Severe depletion  Clavicle Bone Region Severe depletion  Clavicle and Acromion Bone Region Severe depletion  Scapular Bone Region Severe depletion  Dorsal Hand Severe depletion  Patellar Region Moderate depletion  Anterior Thigh Region Moderate depletion  Posterior Calf Region Moderate depletion  Edema (RD Assessment) None  Hair Reviewed  Eyes Reviewed  Mouth Reviewed  Skin Reviewed  Nails Reviewed   Diet Order:   Diet Order             Diet NPO time specified  Diet effective now                   EDUCATION NEEDS:   No education needs have been identified at this time  Skin:  Skin Assessment: Skin Integrity Issues: Skin Integrity Issues:: Stage II, Stage I, DTI DTI: L Heel Stage I: R Heel Stage II: Buttucks & Sacrum  Last BM:  7/7 - Type 7  Height:   Ht Readings from Last 1 Encounters:  10/10/2021 '5\' 6"'$  (1.676 m)    Weight:   Wt Readings from Last 1 Encounters:  09/20/21 68.2 kg    Ideal Body Weight:  59.1 kg  BMI:  Body mass index is 24.27 kg/m.  Estimated Nutritional Needs:  Kcal:  1900-2100  Protein:  95-110 grams  Fluid:  >/= 2 L    Hermina Barters RD, LDN Clinical Dietitian See [facility] for contact information.

## 2021-09-20 NOTE — Plan of Care (Signed)
  Problem: Education: Goal: Knowledge of General Education information will improve Description Including pain rating scale, medication(s)/side effects and non-pharmacologic comfort measures Outcome: Progressing   Problem: Health Behavior/Discharge Planning: Goal: Ability to manage health-related needs will improve Outcome: Progressing   

## 2021-09-20 NOTE — Consult Note (Signed)
Palliative Medicine Inpatient Consult Note  Consulting Provider: Karmen Bongo, MD  Reason for consult:  [name] Palliative Medicine Consult  Reason for Consult? goals of care    09/20/2021  HPI:  Per intake H&P --> [name] [name] is a 61 y.o. female with medical history significant of CAD s/p CABG; depression/anxiety; HTN; HLD; SLE; psoriasis with psoriatic arthritis; RA; OSA not on CPAP; CVA; esophageal dysmotility/dysphagia; chronic systolic CHF; and DM. Presently being treated for severe sepsis. Palliative care has been asked to get involved to further address goals of care in the setting of ongoing aspiration.   Clinical Assessment/Goals of Care:  *Please note that this is a verbal dictation therefore any spelling or grammatical errors are due to the "[employer]" system interpretation.  I have reviewed medical records including EPIC notes, labs and imaging, received report from bedside RN, assessed the patient who is lying in bed she incrementally falls asleep during our conversation though arouses back and understands where conversation left off.    I met with [name] [name] and called her son, [name] [name] to further discuss diagnosis prognosis, GOC, EOL wishes, disposition and options.   I introduced Palliative Medicine as specialized medical care for people living with serious illness. It focuses on providing relief from the symptoms and stress of a serious illness. The goal is to improve quality of life for both the patient and the family.  Medical History Review and Understanding:  Discussed [name] [name] past history of CAD, depression, HTN, SLE, psoriasis, and ongoing esophageal dysmotility and aspiration.   Social, Functional, and Nutritional State:  [name] [name] has continued to decline since December 2022 - she had had recurrent re-hospitalizations. She has not been living alone and has cycled in and out of the acute care and rehabilitation settings. She has  most recently [name]. Bed bound over there. Able to control suction device. Working with therapy 1x a week. Per [name] [name] - nursing director she has gone downhill and has been bedbound since her most recent hospitalizations and not been able to "bounce back". [name] [name] that [name] [name] has declined after each acute hospitalization.    Advance Directives:  A detailed discussion was had today regarding advanced directives.  Reviewed that [name] does not have advance directives though she would rely on her son, [name] [name] to make decisions for her if needed.   Code Status:  Concepts specific to code status, artifical feeding and hydration, continued IV antibiotics and rehospitalization was had.  The difference between a aggressive medical intervention path  and a palliative comfort care path for this patient at this time was had.   MOST reviewed from 12/29/20 which was FULL CODE/FULL SCOPE OF CARE.  Encouraged patient/family to consider DNR/DNI status understanding evidenced based poor outcomes in similar hospitalized patient, as the cause of arrest is likely associated with advanced chronic/terminal illness rather than an easily reversible acute cardio-pulmonary event. I explained that DNR/DNI does not change the medical plan and it only comes into effect after a person has arrested (died).  It is a protective measure to keep [place] from harming the patient in their last moments of life. [name] [name] would like to see her son in person to speak more about this.   Discussion:  We discussed that [name] [name] recently got a gastrostomy tube. Reviewed that the placement of this does not mitigate the ongoing risk of aspirational events.  We reviewed that in all honesty [name] [name] is very likely to continue aspirating and I  suspect we will see her circling in and out of the [name] recurrently until unfortunately she either has a massive cardiac or respiratory event.  I reviewed the importance of really  considering what we should do moving forward in the setting of her likely demise.  Reviewed that the body can only take so much before it starts to show signs of inability to compensate.  [name] [name] is reasonable at this time and [name] that he is more than willing to come in and meet in person with the palliative care team as well as his mother.  We reviewed the importance of ongoing conversations given [name]'s very tenuous health state and her high risk of clinical decompensation.  Discussed the importance of continued conversation with family and their  medical providers regarding overall plan of care and treatment options, ensuring decisions are within the context of the patients values and GOCs.  Decision Maker: [name] [name] (Son): [phone] ([place])  SUMMARY OF RECOMMENDATIONS   FULL CODE/FULL SCOPE OF CARE  Plan to meet with patient's son [name] tomorrow at [employer]  Discussed the ongoing aspiration risk with or without a gastrostomy tube based upon patient's esophageal dysmotility  Ongoing palliative care support until discharge  Code Status/Advance Care Planning: FULL CODE  Palliative Prophylaxis:  Aspiration, Bowel Regimen, Delirium Protocol, Frequent Pain Assessment, Oral Care, Palliative Wound Care, and Turn Reposition  Additional Recommendations (Limitations, Scope, Preferences): Continue current care  Psycho-social/Spiritual:  Desire for further Chaplaincy support: Yes Additional Recommendations: Education on recurrent aspiration   Prognosis: Very limited in the setting of her frail state, chronic co-morbidities, and recurrent re-hospitalizations.   Discharge Planning: Patient came from [place]:   09/20/21 1000 09/20/21 1145  BP: 115/75 107/66  Pulse:  93  Resp: 19 19  Temp:  98.3 F (36.8 C)  SpO2:  92%    Intake/Output Summary (Last 24 hours) at 09/20/2021 1603 Last data filed at 09/20/2021 1243 Gross per 24 hour  Intake  2795.34 ml  Output 750 ml  Net 2045.34 ml   Last Weight  Most recent update: 09/20/2021  5:58 AM    Weight  68.2 kg (150 lb 5.7 oz)            Gen: Older African-American female in moderate distress HEENT: moist mucous membranes CV: Regular rate and rhythm PULM: On 4 L a minute of supplemental oxygen, notable wet cough with copious secretions ABD: soft/nontender EXT: No edema Neuro: Alert and oriented to person place and situation incrementally drowsy  PPS: 10%   This conversation/these recommendations were discussed with patient primary care team, Dr. Maylene Roes  Billing based on MDM: High  Problems Addressed: One acute or chronic illness or injury that poses a threat to life or bodily function  Amount and/or Complexity of Data: Category 3:Discussion of management or test interpretation with external physician/other qualified health care professional/appropriate source (not separately reported)  Risks: Decision regarding hospitalization or escalation of [name] care ______________________________________________________ [place] Team Team Cell Phone: [phone] Please utilize secure chat with additional questions, if there is no response within 30 minutes please call the above phone number  Palliative Medicine Team providers are available by phone from 7am to 7pm daily and can be reached through the team cell phone.  Should this patient require assistance outside of these hours, please call the patient's attending physician.

## 2021-09-21 ENCOUNTER — Inpatient Hospital Stay (HOSPITAL_COMMUNITY): Payer: Medicare HMO

## 2021-09-21 ENCOUNTER — Other Ambulatory Visit (HOSPITAL_COMMUNITY): Payer: Medicare HMO

## 2021-09-21 DIAGNOSIS — R7881 Bacteremia: Secondary | ICD-10-CM | POA: Diagnosis not present

## 2021-09-21 DIAGNOSIS — B9562 Methicillin resistant Staphylococcus aureus infection as the cause of diseases classified elsewhere: Secondary | ICD-10-CM | POA: Diagnosis not present

## 2021-09-21 LAB — ECHOCARDIOGRAM COMPLETE
AR max vel: 1.32 cm2
AV Area VTI: 1.39 cm2
AV Area mean vel: 1.36 cm2
AV Mean grad: 10 mmHg
AV Peak grad: 19.2 mmHg
Ao pk vel: 2.19 m/s
Area-P 1/2: 5.13 cm2
Calc EF: 47.7 %
Height: 66 in
S' Lateral: 3.8 cm
Single Plane A2C EF: 35 %
Single Plane A4C EF: 54.7 %
Weight: 2384.5 oz

## 2021-09-21 LAB — GLUCOSE, CAPILLARY
Glucose-Capillary: 141 mg/dL — ABNORMAL HIGH (ref 70–99)
Glucose-Capillary: 88 mg/dL (ref 70–99)
Glucose-Capillary: 114 mg/dL — ABNORMAL HIGH (ref 70–99)
Glucose-Capillary: 142 mg/dL — ABNORMAL HIGH (ref 70–99)
Glucose-Capillary: 170 mg/dL — ABNORMAL HIGH (ref 70–99)

## 2021-09-21 LAB — BASIC METABOLIC PANEL
Anion gap: 7 (ref 5–15)
BUN: 52 mg/dL — ABNORMAL HIGH (ref 8–23)
CO2: 28 mmol/L (ref 22–32)
Calcium: 8.1 mg/dL — ABNORMAL LOW (ref 8.9–10.3)
Chloride: 108 mmol/L (ref 98–111)
Creatinine, Ser: 1.06 mg/dL — ABNORMAL HIGH (ref 0.44–1.00)
GFR, Estimated: 59 mL/min — ABNORMAL LOW (ref >60)
Glucose, Bld: 145 mg/dL — ABNORMAL HIGH (ref 70–99)
Potassium: 3 mmol/L — ABNORMAL LOW (ref 3.5–5.1)
Sodium: 143 mmol/L (ref 135–145)

## 2021-09-21 LAB — MAGNESIUM: Magnesium: 1.8 mg/dL (ref 1.7–2.4)

## 2021-09-21 LAB — CBC
HCT: 32.2 % — ABNORMAL LOW (ref 36.0–46.0)
Hemoglobin: 9.6 g/dL — ABNORMAL LOW (ref 12.0–15.0)
MCH: 28.1 pg (ref 26.0–34.0)
MCHC: 29.8 g/dL — ABNORMAL LOW (ref 30.0–36.0)
MCV: 94.2 fL (ref 80.0–100.0)
Platelets: 176 10*3/uL (ref 150–400)
RBC: 3.42 MIL/uL — ABNORMAL LOW (ref 3.87–5.11)
RDW: 17 % — ABNORMAL HIGH (ref 11.5–15.5)
WBC: 19.3 10*3/uL — ABNORMAL HIGH (ref 4.0–10.5)
nRBC: 0.1 % (ref 0.0–0.2)

## 2021-09-21 LAB — STREP PNEUMONIAE URINARY ANTIGEN: Strep Pneumo Urinary Antigen: NEGATIVE

## 2021-09-21 MED ORDER — DIATRIZOATE MEGLUMINE & SODIUM 66-10 % PO SOLN
30.0000 mL | Freq: Once | ORAL | Status: AC
  Administered 2021-09-21: 30 mL
  Filled 2021-09-21: qty 30

## 2021-09-21 MED ORDER — POTASSIUM CHLORIDE 10 MEQ/100ML IV SOLN
10.0000 meq | INTRAVENOUS | Status: AC
  Administered 2021-09-21 (×5): 10 meq via INTRAVENOUS
  Filled 2021-09-21 (×5): qty 100

## 2021-09-21 MED ORDER — ZINC OXIDE 12.8 % EX OINT
TOPICAL_OINTMENT | Freq: Every day | CUTANEOUS | Status: DC
  Administered 2021-09-21: 1 via TOPICAL
  Filled 2021-09-21: qty 56.7

## 2021-09-21 MED ORDER — MUPIROCIN 2 % EX OINT
1.0000 | TOPICAL_OINTMENT | Freq: Two times a day (BID) | CUTANEOUS | Status: DC
  Administered 2021-09-21 – 2021-09-22 (×2): 1 via NASAL
  Filled 2021-09-21 (×2): qty 22

## 2021-09-21 MED ORDER — CHLORHEXIDINE GLUCONATE CLOTH 2 % EX PADS
6.0000 | MEDICATED_PAD | Freq: Every day | CUTANEOUS | Status: DC
  Administered 2021-09-22: 6 via TOPICAL

## 2021-09-21 MED ORDER — SODIUM CHLORIDE 0.9 % IV SOLN: INTRAVENOUS | Status: DC

## 2021-09-21 MED ORDER — ONDANSETRON HCL 4 MG/2ML IJ SOLN
4.0000 mg | Freq: Four times a day (QID) | INTRAMUSCULAR | Status: DC | PRN
  Administered 2021-09-21: 4 mg via INTRAVENOUS
  Filled 2021-09-21: qty 2

## 2021-09-21 NOTE — Consult Note (Signed)
WOC Nurse Consult Note: Reason for Consult:Peri-tube skin irritation at G-Tube site, partial thickness (Stage 2) Wound type: irritant contact dermatitis vs device related pressure injury Pressure Injury POA: Yes Measurement: At 12 o'clock:0.6cm round x 0.1 At 6 o'clock:0.3cm x 1cm x 0.1cm Wound GHW:EXHB, red Drainage (amount, consistency, odor) None Periwound: intact Dressing procedure/placement/frequency: I have provided Nursing with conservative guidance for daily cleansing of te area with soap and water, rinse and pat dry followed by a thin  application of zinc paste skin barrier to the affected areas. This is to be covered with a single split-gauge dressing.  If desired, further guidance can be provided by GI MD.  [place] nursing team will not follow, but will remain available to this patient, the nursing and medical teams.  Please re-consult if needed.  Thank you for inviting [place] to participate in this patient's Plan of Care.  Maudie Flakes, MSN, RN, CNS, [place], Serita Grammes, [employer], [employer] phone:  [phone]

## 2021-09-21 NOTE — Progress Notes (Signed)
PROGRESS NOTE    [name] [name]  YBW:[id number] DOB: 1960/09/09 DOA: 10/10/2021 PCP: Leeroy Cha, MD     Brief Narrative:  [name] [name] is a 61 y.o. female with medical history significant of CAD s/p CABG; depression/anxiety; HTN; HLD; SLE; psoriasis with psoriatic arthritis; RA; OSA not on CPAP; CVA; esophageal dysmotility/dysphagia; chronic systolic CHF; and DM presenting with CP, hypoxia to 87%.    She was last admitted from 5/22-6/13 with aspiration PNA and ESBL E coli UTI with bacteremia.  She was treated with Meropenem -> Augmentin and was able to wean off O2.  She also had a large R-sided pleural effusion s/p thoracentesis.  She has had persistent hematemesis since and was seen for this in the ER on 6/27.    Patient was admitted with sepsis secondary to suspected aspiration pneumonia.  She required BiPAP on admission.  PCCM was consulted.  Patient started on broad-spectrum antibiotics.  New events last 24 hours / Subjective: Seen and examined.  She complains of pain in the buttock area and peg site..  Assessment & Plan:   Principal Problem:   MRSA bacteremia Active Problems:   Hyperlipidemia with target LDL less than 70   Accelerated hypertension   Insulin-requiring or dependent type II diabetes mellitus (HCC)   Pressure injury of skin   CAD (coronary artery disease) of artery bypass graft   AKI (acute kidney injury) ([place])   Chronic combined systolic and diastolic CHF (congestive heart failure) (HCC)   Dysphagia   Severe sepsis (HCC)   Aspiration pneumonia (HCC)   Dehydration with hypernatremia   Respiratory failure with hypoxia ([place])   Demand ischemia (HCC)   Severe sepsis secondary to MRSA bacteremia, sepsis present on admission -Sepsis present on admission with fever 102.7, tachycardia 130s, tachypnea 20s, respiratory failure and lactic acid peak of 5.0 -Infectious disease following -Has purulent discharge from PEG tube site.  CT abdomen pelvis  completed which ruled out any abscess.  Wound care consulted for this.  Continue vancomycin  Hypernatremia Resolved.  Oropharyngeal dysphagia, esophageal dysmotility -Major contributor to recurrent aspiration and aspiration pneumonitis -N.p.o.  Diarrhea Improving.  C. difficile ruled out.  AKI  -Baseline creatinine 0.5 -Improving.  Will resume IV fluids.  Demand ischemia -Troponin 98, 111 -Insetting of sepsis  CAD status post CABG -Aspirin  Hypertension  -Lopressor on hold  Hyperlipidemia -Lipitor  Chronic combined systolic and diastolic heart failure -Echo on 05/01/21 was 45% with grade 1 diastolic dysfunction -Euvolemic.  Diabetes mellitus -A1c 6.7 -Sliding scale insulin.  Fluctuating blood sugar.  History of lupus/rheumatoid arthritis/psoriasis -Does not appear to be on immunosuppressants prior to admission  OSA -Not on CPAP  Goals of care -Palliative care consulted  Hypokalemia: We will replace.  In agreement with assessment of the pressure ulcer as below:  Pressure Injury 10/05/2021 Buttocks Bilateral Stage 2 -  Partial thickness loss of dermis presenting as a shallow open injury with a red, pink wound bed without slough. (Active)  09/18/2021 1900  Location: Buttocks  Location Orientation: Bilateral  Staging: Stage 2 -  Partial thickness loss of dermis presenting as a shallow open injury with a red, pink wound bed without slough.  Wound Description (Comments):   Present on Admission: Yes  Dressing Type Foam - Lift dressing to assess site every shift 09/20/21 1639     Pressure Injury 10/02/2021 Sacrum Stage 2 -  Partial thickness loss of dermis presenting as a shallow open injury with a red, pink wound bed without slough. (  Active)  09/27/2021 1900  Location: Sacrum  Location Orientation:   Staging: Stage 2 -  Partial thickness loss of dermis presenting as a shallow open injury with a red, pink wound bed without slough.  Wound Description (Comments):    Present on Admission: Yes  Dressing Type Foam - Lift dressing to assess site every shift 09/20/21 2330     Pressure Injury 09/24/2021 Heel Right Stage 1 -  Intact skin with non-blanchable redness of a localized area usually over a bony prominence. (Active)  10/07/2021 1900  Location: Heel  Location Orientation: Right  Staging: Stage 1 -  Intact skin with non-blanchable redness of a localized area usually over a bony prominence.  Wound Description (Comments):   Present on Admission:   Dressing Type Foam - Lift dressing to assess site every shift 09/20/21 2330     Pressure Injury 10/10/2021 Heel Left Deep Tissue Pressure Injury - Purple or maroon localized area of discolored intact skin or blood-filled blister due to damage of underlying soft tissue from pressure and/or shear. (Active)  10/01/2021 1900  Location: Heel  Location Orientation: Left  Staging: Deep Tissue Pressure Injury - Purple or maroon localized area of discolored intact skin or blood-filled blister due to damage of underlying soft tissue from pressure and/or shear.  Wound Description (Comments):   Present on Admission: Yes  Dressing Type Foam - Lift dressing to assess site every shift 09/20/21 2330     Nutrition Problem: Moderate Malnutrition Etiology: chronic illness (CHF, dysphagia)   DVT prophylaxis:  enoxaparin (LOVENOX) injection 40 mg Start: 10/12/2021 1145  Code Status: Full Family Communication: None at bedside. Disposition Plan:  Status is: Inpatient Remains inpatient appropriate because: IV antibiotics and work up   Consultants:  PCCM Cardiology    Antimicrobials:  Anti-infectives (From admission, onward)    Start     Dose/Rate Route Frequency Ordered Stop   09/20/21 0700  vancomycin (VANCOCIN) IVPB 1000 mg/200 mL premix        1,000 mg 200 mL/hr over 60 Minutes Intravenous Every 24 hours 09/18/2021 1110     10/01/2021 1800  metroNIDAZOLE (FLAGYL) IVPB 500 mg  Status:  Discontinued        500 mg 100  mL/hr over 60 Minutes Intravenous Every 12 hours 09/21/2021 1042 10/11/2021 1118   10/04/2021 1700  ceFEPIme (MAXIPIME) 2 g in sodium chloride 0.9 % 100 mL IVPB  Status:  Discontinued        2 g 200 mL/hr over 30 Minutes Intravenous Every 12 hours 09/20/2021 1047 09/20/21 0905   10/03/2021 1400  ceFEPIme (MAXIPIME) 2 g in sodium chloride 0.9 % 100 mL IVPB  Status:  Discontinued        2 g 200 mL/hr over 30 Minutes Intravenous Every 8 hours 09/14/2021 1042 09/27/2021 1047   09/29/2021 0515  vancomycin (VANCOREADY) IVPB 1500 mg/300 mL        1,500 mg 150 mL/hr over 120 Minutes Intravenous  Once 10/11/2021 0506 09/22/2021 0925   10/01/2021 0500  ceFEPIme (MAXIPIME) 2 g in sodium chloride 0.9 % 100 mL IVPB        2 g 200 mL/hr over 30 Minutes Intravenous  Once 09/14/2021 0453 09/24/2021 0638   10/05/2021 0500  metroNIDAZOLE (FLAGYL) IVPB 500 mg        500 mg 100 mL/hr over 60 Minutes Intravenous  Once 10/04/2021 0453 10/10/2021 0710   09/24/2021 0500  vancomycin (VANCOCIN) IVPB 1000 mg/200 mL premix  Status:  Discontinued  1,000 mg 200 mL/hr over 60 Minutes Intravenous  Once 09/18/2021 0453 09/29/2021 0503        Objective: Vitals:   09/20/21 2040 09/20/21 2347 09/21/21 0423 09/21/21 0424  BP:  123/71 123/72   Pulse: 97 92 86   Resp: '18 18 20   '$ Temp:  98.4 F (36.9 C) 98.6 F (37 C)   TempSrc:  Oral Axillary   SpO2: 93% 98% 94%   Weight:    67.6 kg  Height:        Intake/Output Summary (Last 24 hours) at 09/21/2021 0916 Last data filed at 09/21/2021 0429 Gross per 24 hour  Intake 1456.26 ml  Output 1000 ml  Net 456.26 ml    Filed Weights   10/10/2021 0443 09/20/21 0500 09/21/21 0424  Weight: 77.6 kg 68.2 kg 67.6 kg    Examination:   General exam: Appears calm and comfortable but chronically sick. Respiratory system: Clear to auscultation. Respiratory effort normal. Cardiovascular system: S1 & S2 heard, RRR. No JVD, murmurs, rubs, gallops or clicks. No pedal edema. Gastrointestinal system: Abdomen is  nondistended, soft and nontender. No organomegaly or masses felt. Normal bowel sounds heard.  Has PEG tube and has some purulent discharge from there.  Some erythema around that and tender to palpation as well. Central nervous system: Alert and oriented. No focal neurological deficits. Extremities: Symmetric 5 x 5 power. Skin: No rashes, lesions or ulcers.  Psychiatry: Judgement and insight appear normal. Mood & affect appropriate.   Data Reviewed: I have personally reviewed following labs and imaging studies  CBC: Recent Labs  Lab 09/29/2021 0458 09/18/2021 0742 09/20/21 0423 09/21/21 0440  WBC 6.3  --  10.9* 19.3*  NEUTROABS 5.7  --   --   --   HGB 14.4 9.9* 11.6* 9.6*  HCT 50.2* 29.0* 40.8 32.2*  MCV 98.4  --  99.0 94.2  PLT 204  --  106* 742    Basic Metabolic Panel: Recent Labs  Lab 09/25/2021 0458 09/25/2021 0742 09/20/2021 0952 09/20/21 0423 09/20/21 2120 09/21/21 0440  NA 154* 156* 152* 150* 144 143  K 4.6 4.0 4.5 4.4 3.1* 3.0*  CL 111  --  115* 108 107 108  CO2 35*  --  '30 27 27 28  '$ GLUCOSE 132*  --  103* 128* 157* 145*  BUN 49*  --  47* 50* 49* 52*  CREATININE 1.30*  --  1.25* 1.28* 1.05* 1.06*  CALCIUM 9.3  --  8.1* 8.4* 8.3* 8.1*  MG  --   --   --  1.9  --  1.8  PHOS  --   --   --  4.1  --   --     GFR: Estimated Creatinine Clearance: 51.5 mL/min (A) (by C-G formula based on SCr of 1.06 mg/dL (H)). Liver Function Tests: Recent Labs  Lab 10/10/2021 0458  AST 77*  ALT 24  ALKPHOS 108  BILITOT 0.6  PROT 9.8*  ALBUMIN 1.8*    No results for input(s): "LIPASE", "AMYLASE" in the last 168 hours. No results for input(s): "AMMONIA" in the last 168 hours. Coagulation Profile: Recent Labs  Lab 09/22/2021 0458  INR 1.2    Cardiac Enzymes: No results for input(s): "CKTOTAL", "CKMB", "CKMBINDEX", "TROPONINI" in the last 168 hours. BNP (last 3 results) No results for input(s): "PROBNP" in the last 8760 hours. HbA1C: No results for input(s): "HGBA1C" in the last  72 hours. CBG: Recent Labs  Lab 09/20/21 1552 09/20/21 1951 09/20/21 2343 09/21/21  1610 09/21/21 0756  GLUCAP 113* 167* 97 141* 88    Lipid Profile: No results for input(s): "CHOL", "HDL", "LDLCALC", "TRIG", "CHOLHDL", "LDLDIRECT" in the last 72 hours. Thyroid Function Tests: No results for input(s): "TSH", "T4TOTAL", "FREET4", "T3FREE", "THYROIDAB" in the last 72 hours. Anemia Panel: No results for input(s): "VITAMINB12", "FOLATE", "FERRITIN", "TIBC", "IRON", "RETICCTPCT" in the last 72 hours. Sepsis Labs: Recent Labs  Lab 10/03/2021 0952 09/26/2021 0953 09/28/2021 1223 09/20/21 0800 09/20/21 0948 09/20/21 2120  PROCALCITON 0.47  --   --   --   --   --   LATICACIDVEN  --    < > 5.0* 3.8* 4.2* 2.5*   < > = values in this interval not displayed.     Recent Results (from the past 240 hour(s))  Blood Culture (routine x 2)     Status: None (Preliminary result)   Collection Time: 10/06/2021  4:58 AM   Specimen: BLOOD  Result Value Ref Range Status   Specimen Description BLOOD LEFT ANTECUBITAL  Final   Special Requests   Final    BOTTLES DRAWN AEROBIC AND ANAEROBIC Blood Culture results may not be optimal due to an excessive volume of blood received in culture bottles   Culture   Final    NO GROWTH 2 DAYS Performed at [facility] [address], [place], [place] [address]    Report Status PENDING  Incomplete  Blood Culture (routine x 2)     Status: Abnormal (Preliminary result)   Collection Time: 10/09/2021  5:41 AM   Specimen: BLOOD  Result Value Ref Range Status   Specimen Description BLOOD SITE NOT SPECIFIED  Final   Special Requests   Final    BOTTLES DRAWN AEROBIC AND ANAEROBIC Blood Culture adequate volume   Culture  Setup Time   Final    GRAM POSITIVE COCCI AEROBIC BOTTLE ONLY CRITICAL RESULT CALLED TO, READ BACK BY AND VERIFIED WITH: T RUDISILL,PHARMD'@0605'$  09/20/21 [place]    Culture (A)  Final    STAPHYLOCOCCUS AUREUS SUSCEPTIBILITIES TO FOLLOW Performed at  [facility], [place] [address], [place], [place] [address]    Report Status PENDING  Incomplete  Blood Culture ID Panel (Reflexed)     Status: Abnormal   Collection Time: 10/01/2021  5:41 AM  Result Value Ref Range Status   Enterococcus faecalis NOT DETECTED NOT DETECTED Final   Enterococcus Faecium NOT DETECTED NOT DETECTED Final   Listeria monocytogenes NOT DETECTED NOT DETECTED Final   Staphylococcus species DETECTED (A) NOT DETECTED Final    Comment: CRITICAL RESULT CALLED TO, READ BACK BY AND VERIFIED WITH: T RUDISILL,PHARMD'@0605'$  09/20/21 [place]    Staphylococcus aureus (BCID) DETECTED (A) NOT DETECTED Final    Comment: Methicillin (oxacillin)-resistant Staphylococcus aureus (MRSA). MRSA is predictably resistant to beta-lactam antibiotics (except ceftaroline). Preferred therapy is vancomycin unless clinically contraindicated. Patient requires contact precautions if  hospitalized. CRITICAL RESULT CALLED TO, READ BACK BY AND VERIFIED WITH: T RUDISILL,PHARMD'@0605'$  09/20/21 [place]    Staphylococcus epidermidis NOT DETECTED NOT DETECTED Final   Staphylococcus lugdunensis NOT DETECTED NOT DETECTED Final   Streptococcus species NOT DETECTED NOT DETECTED Final   Streptococcus agalactiae NOT DETECTED NOT DETECTED Final   Streptococcus pneumoniae NOT DETECTED NOT DETECTED Final   Streptococcus pyogenes NOT DETECTED NOT DETECTED Final   A.calcoaceticus-baumannii NOT DETECTED NOT DETECTED Final   Bacteroides fragilis NOT DETECTED NOT DETECTED Final   Enterobacterales NOT DETECTED NOT DETECTED Final   Enterobacter cloacae complex NOT DETECTED NOT DETECTED Final  Escherichia coli NOT DETECTED NOT DETECTED Final   Klebsiella aerogenes NOT DETECTED NOT DETECTED Final   Klebsiella oxytoca NOT DETECTED NOT DETECTED Final   Klebsiella pneumoniae NOT DETECTED NOT DETECTED Final   Proteus species NOT DETECTED NOT DETECTED Final   Salmonella species NOT DETECTED NOT DETECTED Final   Serratia marcescens  NOT DETECTED NOT DETECTED Final   Haemophilus influenzae NOT DETECTED NOT DETECTED Final   Neisseria meningitidis NOT DETECTED NOT DETECTED Final   Pseudomonas aeruginosa NOT DETECTED NOT DETECTED Final   Stenotrophomonas maltophilia NOT DETECTED NOT DETECTED Final   Candida albicans NOT DETECTED NOT DETECTED Final   Candida auris NOT DETECTED NOT DETECTED Final   Candida glabrata NOT DETECTED NOT DETECTED Final   Candida krusei NOT DETECTED NOT DETECTED Final   Candida parapsilosis NOT DETECTED NOT DETECTED Final   Candida tropicalis NOT DETECTED NOT DETECTED Final   Cryptococcus neoformans/gattii NOT DETECTED NOT DETECTED Final   Meth resistant mecA/C and MREJ DETECTED (A) NOT DETECTED Final    Comment: CRITICAL RESULT CALLED TO, READ BACK BY AND VERIFIED WITH: T RUDISILL,PHARMD'@0605'$  09/20/21 [place] Performed at [facility], [place] [address], [place], [place] [address]   Urine Culture     Status: None   Collection Time: 10/11/2021  8:03 AM   Specimen: In/Out Cath Urine  Result Value Ref Range Status   Specimen Description IN/OUT CATH URINE  Final   Special Requests NONE  Final   Culture   Final    NO GROWTH Performed at [facility], [place] [address], [place], [place] [address]    Report Status 09/20/2021 FINAL  Final  C Difficile Quick Screen w PCR reflex     Status: None   Collection Time: 09/20/21 12:18 PM   Specimen: STOOL  Result Value Ref Range Status   C Diff antigen NEGATIVE NEGATIVE Final   C Diff toxin NEGATIVE NEGATIVE Final   C Diff interpretation No C. difficile detected.  Final    Comment: NEGATIVE Performed at [facility], [place] [address] [place], [place] [address]   MRSA Next Gen by PCR, Nasal     Status: Abnormal   Collection Time: 09/20/21 12:54 PM   Specimen: Nasal Mucosa; Nasal Swab  Result Value Ref Range Status   MRSA by PCR Next Gen DETECTED (A) NOT DETECTED Final    Comment: RESULT CALLED TO, READ BACK BY AND VERIFIED WITH: RN Saul Fordyce  [phone] @ [phone] FH  (NOTE) The GeneXpert MRSA Assay (FDA approved for NASAL specimens only), is one component of a comprehensive MRSA colonization surveillance program. It is not intended to diagnose MRSA infection nor to guide or monitor treatment for MRSA infections. Test performance is not FDA approved in patients less than 39 years old. Performed at [facility], [place] [address] [place], [place] [address]       Radiology Studies: CT ABDOMEN PELVIS W CONTRAST  Result Date: 09/20/2021 CLINICAL DATA:  Intra-abdominal abscess, sepsis EXAM: CT ABDOMEN AND PELVIS WITH CONTRAST TECHNIQUE: Multidetector CT imaging of the abdomen and pelvis was performed using the standard protocol following bolus administration of intravenous contrast. RADIATION DOSE REDUCTION: This exam was performed according to the departmental dose-optimization program which includes automated exposure control, adjustment of the mA and/or kV according to patient size and/or use of iterative reconstruction technique. CONTRAST:  30m OMNIPAQUE IOHEXOL 300 MG/ML  SOLN COMPARISON:  08/14/2021 FINDINGS: Lower chest: Small right pleural effusion. Dependent bilateral lower lobe consolidation,  left greater than right, consistent with pneumonia and/or atelectasis. Heart is enlarged without pericardial effusion. Hepatobiliary: No focal liver abnormality is seen. No gallstones, gallbladder wall thickening, or biliary dilatation. Pancreas: Unremarkable. No pancreatic ductal dilatation or surrounding inflammatory changes. Spleen: Normal in size without focal abnormality. Adrenals/Urinary Tract: Bladder is decompressed with a Foley catheter. Renals are unremarkable. Kidneys enhance normally without urinary tract calculi or obstruction. Stomach/Bowel: No bowel obstruction or ileus. No bowel wall thickening or inflammatory change. Percutaneous gastrostomy tube within the gastric lumen Vascular/Lymphatic: Aortic atherosclerosis. No enlarged  abdominal or pelvic lymph nodes. Subcentimeter inguinal lymph nodes are likely reactive. Reproductive: Status post hysterectomy. No adnexal masses. Other: Trace pelvic free fluid. No free intraperitoneal gas. No abdominal wall hernia. Musculoskeletal: No acute or destructive bony lesions. Reconstructed images demonstrate no additional findings. IMPRESSION: 1. Bilateral lower lobe consolidation, left greater than right, favor combination of pneumonia and atelectasis. 2. Small right pleural effusion. 3. Trace pelvic free fluid, nonspecific. 4. Likely reactive bilateral inguinal lymph nodes. No pathologic adenopathy. 5.  Aortic Atherosclerosis (ICD10-I70.0). Electronically Signed   By: Randa Ngo M.D.   On: 09/20/2021 17:18      Scheduled Meds:  aspirin  81 mg Per Tube Daily   atorvastatin  80 mg Per Tube Daily   Chlorhexidine Gluconate Cloth  6 each Topical Daily   enoxaparin (LOVENOX) injection  40 mg Subcutaneous Daily   escitalopram  10 mg Per Tube QHS   feeding supplement (JEVITY 1.5 CAL/FIBER)  237 mL Per Tube 5 X Daily   feeding supplement (PROSource TF)  45 mL Per Tube QID   folic acid  1 mg Per Tube Daily   free water  150 mL Per Tube Q4H   Gerhardt's butt cream   Topical QID   insulin aspart  0-9 Units Subcutaneous Q4H   nutrition supplement (JUVEN)  1 packet Per Tube BID BM   mouth rinse  15 mL Mouth Rinse 4 times per day   pantoprazole sodium  40 mg Per Tube Daily   phosphorus  250 mg Per Tube Daily   sodium chloride flush  3 mL Intravenous Q12H   Continuous Infusions:  potassium chloride 10 mEq (09/21/21 0835)   vancomycin 1,000 mg (09/21/21 0606)     LOS: 2 days   Darliss Cheney, MD [facility] 09/21/2021, 9:16 AM   Available via Epic secure chat 7am-7pm After these hours, please refer to coverage provider listed on [email or web address]

## 2021-09-21 NOTE — Progress Notes (Signed)
  Echocardiogram 2D Echocardiogram has been performed.  [name] [name] F 09/21/2021, 3:19 PM

## 2021-09-21 NOTE — Progress Notes (Signed)
Mobility Specialist Progress Note:   09/21/21 1119  Mobility  Activity  (Bed level exercises)  Level of Assistance Moderate assist, patient does 50-74%  Assistive Device None  Activity Response Tolerated fair  $Mobility charge 1 Mobility   Pt agreeable to bed exercises. Completed arm exercises then pt had loose BM. Assisted with cleaning pt and left in bed with call bell in reach and all needs met.   [facility] [name] Mobility Specialist

## 2021-09-21 NOTE — Plan of Care (Signed)
  Problem: Activity: Goal: Ability to tolerate increased activity will improve Outcome: Progressing   Problem: Clinical Measurements: Goal: Ability to maintain a body temperature in the normal range will improve Outcome: Progressing   

## 2021-09-21 NOTE — Progress Notes (Addendum)
Palliative Medicine Inpatient Follow Up Note   HPI: [name] [name] is a 61 y.o. female with medical history significant of CAD s/p CABG; depression/anxiety; HTN; HLD; SLE; psoriasis with psoriatic arthritis; RA; OSA not on CPAP; CVA; esophageal dysmotility/dysphagia; chronic systolic CHF; and DM. Presently being treated for severe sepsis. Palliative care has been asked to get involved to further address goals of care in the setting of ongoing aspiration.   Today's Discussion 09/21/2021  *Please note that this is a verbal dictation therefore any spelling or grammatical errors are due to the "[employer]" system interpretation.  Chart reviewed inclusive of vital signs, progress notes, laboratory results, and diagnostic images.   I met this early evening with patient's sister and brother at bedside.  Patient's son [name] [name] did not show up -I tried to call him 3 times and waited over 80 minutes for him to participate in her family meeting.  I was able to help assist [name] [name] to turn and be changed.  This was a miserable event whereby she was screaming and moaning in pain.  She is notable peritoneal dermatitis and shearing of her buttock.  She continues to yell throughout her linen change saying she cannot do this anymore and she is ready to die.  While changing her I was able to note her gastrostomy tube which has notable leakage around it.  It appears that part of the apparatus initially inserted is migrated into her skin.   I reviewed with patient's siblings that she is in a very tenuous situation whereby she is a very high likelihood of aspirating again on her oral secretions.  We reviewed that even her gastrostomy tube cannot abate this reality.  I had a very detailed conversation in regards to cardiopulmonary resuscitation and the risks and benefits of such an aggressive effort.  I shared with [name] [name] very openly and honestly that I do not feel it would benefit her and if anything would be sure  to cause her body great trauma.  [name] [name] continues to doze off in conversation during my time with her though it does seem that her siblings understand the situation.  We reviewed the differences between hospice and palliative care.  I shared that at this point time I would recommend strongly that the consideration of hospice for [name] [name] as we have an incurable underlying disease process in the setting of her recurrent aspirations.  Created space and opportunity for patient to explore thoughts feelings and fears regarding current medical situation.  Family shares that they understand and that "this is part of life".  I reviewed the importance of patient's sons being present for further conversations as they will be the primary decision makers along with [name] [name].  Questions and concerns addressed   Palliative Support Provided ___________________________ Addendum:  I called patient's son, [name] [name] after leaving the room multiple times the reached voicemail.  I called patient's other son [name] [name] and discussed [name] [name]'s poor clinical state which she is well aware of.  We agreed to meet tomorrow at noon he shared that he will update his brother to ensure presence.  Objective Assessment: Vital Signs Vitals:   09/21/21 1110 09/21/21 1633  BP: 120/71 123/70  Pulse: (!) 101 (!) 103  Resp: 19 17  Temp: 98.4 F (36.9 C) 98.2 F (36.8 C)  SpO2: 92% 98%    Intake/Output Summary (Last 24 hours) at 09/21/2021 1735 Last data filed at 09/21/2021 1110 Gross per 24 hour  Intake 1036.35 ml  Output 1200 ml  Net -163.65 ml   Last Weight  Most recent update: 09/21/2021  4:24 AM    Weight  67.6 kg (149 lb 0.5 oz)            Gen: Older African-American female in moderate distress HEENT: moist mucous membranes CV: Regular rate and rhythm PULM: On 4 L a minute of supplemental oxygen, notable wet cough with copious secretions ABD: soft/nontender EXT: No edema Neuro: Alert and oriented to person place and  situation incrementally drowsy  SUMMARY OF RECOMMENDATIONS   FULL CODE/FULL SCOPE OF CARE --> have shared with both patient and her family that I worry cardiopulmonary resuscitation will cause her more trauma and harm than true benefit   Plan to meet with patient's son(s) at noon tomorrow   Discussed the ongoing aspiration risk with or without a gastrostomy tube based upon patient's esophageal dysmotility  Reviewed the differences between palliative care and hospice   Ongoing palliative care support until discharge  Time Spent: 96  Billing based on MDM: High ______________________________________________________________________________________ [facility] Team Team Cell Phone: [phone] Please utilize secure chat with additional questions, if there is no response within 30 minutes please call the above phone number  Palliative Medicine Team providers are available by phone from 7am to 7pm daily and can be reached through the team cell phone.  Should this patient require assistance outside of these hours, please call the patient's attending physician.

## 2021-09-22 DIAGNOSIS — R7881 Bacteremia: Secondary | ICD-10-CM | POA: Diagnosis not present

## 2021-09-22 DIAGNOSIS — B9562 Methicillin resistant Staphylococcus aureus infection as the cause of diseases classified elsewhere: Secondary | ICD-10-CM | POA: Diagnosis not present

## 2021-09-22 LAB — VANCOMYCIN, PEAK: Vancomycin Pk: 44 ug/mL — ABNORMAL HIGH (ref 30–40)

## 2021-09-22 LAB — BASIC METABOLIC PANEL
Anion gap: 9 (ref 5–15)
BUN: 52 mg/dL — ABNORMAL HIGH (ref 8–23)
CO2: 26 mmol/L (ref 22–32)
Calcium: 8.1 mg/dL — ABNORMAL LOW (ref 8.9–10.3)
Chloride: 111 mmol/L (ref 98–111)
Creatinine, Ser: 0.95 mg/dL (ref 0.44–1.00)
GFR, Estimated: >60 mL/min (ref >60)
Glucose, Bld: 106 mg/dL — ABNORMAL HIGH (ref 70–99)
Potassium: 3.1 mmol/L — ABNORMAL LOW (ref 3.5–5.1)
Sodium: 146 mmol/L — ABNORMAL HIGH (ref 135–145)

## 2021-09-22 LAB — CULTURE, BLOOD (ROUTINE X 2): Special Requests: ADEQUATE

## 2021-09-22 LAB — GLUCOSE, CAPILLARY
Glucose-Capillary: 107 mg/dL — ABNORMAL HIGH (ref 70–99)
Glucose-Capillary: 137 mg/dL — ABNORMAL HIGH (ref 70–99)
Glucose-Capillary: 64 mg/dL — ABNORMAL LOW (ref 70–99)
Glucose-Capillary: 78 mg/dL (ref 70–99)

## 2021-09-22 MED ORDER — ACETAMINOPHEN 325 MG PO TABS: 650.0000 mg | ORAL_TABLET | Freq: Four times a day (QID) | ORAL | Status: DC | PRN

## 2021-09-22 MED ORDER — HYDROMORPHONE HCL 1 MG/ML IJ SOLN: 1.0000 mg | INTRAMUSCULAR | Status: DC | PRN

## 2021-09-22 MED ORDER — HYDROMORPHONE HCL 1 MG/ML IJ SOLN
1.0000 mg | INTRAMUSCULAR | Status: DC
  Administered 2021-09-22 – 2021-09-23 (×5): 1 mg via INTRAVENOUS
  Filled 2021-09-22 (×5): qty 1

## 2021-09-22 MED ORDER — BIOTENE DRY MOUTH MT LIQD: 15.0000 mL | OROMUCOSAL | Status: DC | PRN

## 2021-09-22 MED ORDER — ACETAMINOPHEN 650 MG RE SUPP: 650.0000 mg | Freq: Four times a day (QID) | RECTAL | Status: DC | PRN

## 2021-09-22 MED ORDER — SCOPOLAMINE 1 MG/3DAYS TD PT72
1.0000 | MEDICATED_PATCH | TRANSDERMAL | Status: DC
  Administered 2021-09-22: 1.5 mg via TRANSDERMAL
  Filled 2021-09-22: qty 1

## 2021-09-22 MED ORDER — GLYCOPYRROLATE 0.2 MG/ML IJ SOLN
0.4000 mg | INTRAMUSCULAR | Status: DC
  Administered 2021-09-22 (×2): 0.2 mg via INTRAVENOUS
  Administered 2021-09-23 (×3): 0.4 mg via INTRAVENOUS
  Filled 2021-09-22 (×5): qty 2

## 2021-09-22 MED ORDER — LORAZEPAM 2 MG/ML IJ SOLN
1.0000 mg | INTRAMUSCULAR | Status: DC
  Administered 2021-09-22 – 2021-09-23 (×4): 1 mg via INTRAVENOUS
  Filled 2021-09-22 (×4): qty 1

## 2021-09-22 MED ORDER — LORAZEPAM 2 MG/ML IJ SOLN: 0.5000 mg | INTRAMUSCULAR | Status: DC | PRN

## 2021-09-22 MED ORDER — PANTOPRAZOLE SODIUM 40 MG IV SOLR: 40.0000 mg | INTRAVENOUS | Status: DC

## 2021-09-22 MED ORDER — POLYVINYL ALCOHOL 1.4 % OP SOLN: 1.0000 [drp] | Freq: Four times a day (QID) | OPHTHALMIC | Status: DC | PRN

## 2021-09-22 NOTE — Progress Notes (Signed)
Patients peg tube is leaking and unable to be used for tube administered medications. Pahwani, MD made aware. MD stated that he  found out that patient had PEG tube placement by IR here just last month.  he consulted IR to check the PEG tube but they might not be able to do so today due to weekend. Patient also made aware.

## 2021-09-22 NOTE — Progress Notes (Signed)
Blood sugar 64, Pahwani,MD made aware.

## 2021-09-22 NOTE — Progress Notes (Signed)
Patient s/p 20 Fr push through G tube placement 08/20/21 in IR.   Attempted to assess patient's G tube due to reports of leaking - on presentation to room patient undergoing [place] meeting with family/palliative care provider and this provider asked to return at a later time.  Per RN attempted to use for meds this morning however tube leaked and patient screamed so they are not currently using the tube.  KUB yesterday shows G tube to be in appropriate position with contrast entering gastric lumen.  IR APP will assess G tube tomorrow (7/10) and ensure balloon is functional and in appropriate position/bumper is cinched tightly to the skin. IR APP will determine if repeat KUB is needed tomorrow. We typically do not remove or replace G tubes that are less than 47 weeks old due to high chance of peritonitis from immature tract.  Candiss Norse, [name]

## 2021-09-22 NOTE — Progress Notes (Signed)
[name] NOTE    JOURNEY CASTONGUAY  DPO:[id number] DOB: [birth year] DOA: 09/27/2021 PCP: Leeroy Cha, [name]     Brief Narrative:  [name] [name] is a 61 y.o. female with medical history significant of CAD s/p CABG; depression/anxiety; HTN; HLD; SLE; psoriasis with psoriatic arthritis; RA; OSA not on CPAP; CVA; esophageal dysmotility/dysphagia; chronic systolic CHF; and DM presenting with CP, hypoxia to 87%.    She was last admitted from 5/22-6/13 with aspiration PNA and ESBL E coli UTI with bacteremia.  She was treated with Meropenem -> Augmentin and was able to wean off O2.  She also had a large R-sided pleural effusion s/p thoracentesis.  She has had persistent hematemesis since and was seen for this in the ER on 6/27.    Patient was admitted with sepsis secondary to suspected aspiration pneumonia.  She required BiPAP on admission.  PCCM was consulted.  Patient started on broad-spectrum antibiotics.  New events last 24 hours / Subjective: Patient seen and examined this morning.  She was crying and pain from the PEG tube site.  She was having copious foul-smelling discharge from her PEG tube site as well.  I tried to broach the topic with her to change her CODE STATUS to DNR, she was tearful but also understanding.  I left her with that decision to make as she was going to meet with the palliative care in about 2 hours.   Assessment & Plan:   Principal Problem:   MRSA bacteremia Active Problems:   Hyperlipidemia with target LDL less than 70   Accelerated hypertension   Insulin-requiring or dependent type II diabetes mellitus (HCC)   Pressure injury of skin   CAD (coronary artery disease) of artery bypass graft   AKI (acute kidney injury) ([place])   Chronic combined systolic and diastolic CHF (congestive heart failure) (HCC)   Dysphagia   Severe sepsis (HCC)   Aspiration pneumonia (HCC)   Dehydration with hypernatremia   Respiratory failure with hypoxia ([place])   Demand ischemia  (HCC)   Severe sepsis secondary to MRSA bacteremia, sepsis present on admission -Sepsis present on admission with fever 102.7, tachycardia 130s, tachypnea 20s, respiratory failure and lactic acid peak of 5.0 -Infectious disease following -Has purulent discharge from PEG tube site.  CT abdomen pelvis completed which ruled out any abscess.  Wound care consulted for this.  Now that she has been transition to comfort care, will defer to palliative care if they have discussed with the family about discontinuing vancomycin.  Hypernatremia Resolved.  Oropharyngeal dysphagia, esophageal dysmotility -Major contributor to recurrent aspiration and aspiration pneumonitis -N.p.o.  Diarrhea Improving.  C. difficile ruled out.  AKI  Resolved  Demand ischemia -Troponin 98, 111 -Insetting of sepsis  CAD status post CABG -Aspirin  Hypertension  -Lopressor on hold  Hyperlipidemia -Lipitor  Chronic combined systolic and diastolic heart failure -Echo on 05/01/21 was 45% with grade 1 diastolic dysfunction -Euvolemic.  Diabetes mellitus -A1c 6.7 -Sliding scale insulin.  Fluctuating blood sugar.  History of lupus/rheumatoid arthritis/psoriasis -Does not appear to be on immunosuppressants prior to admission  OSA -Not on CPAP  Goals of care -Palliative care consulted and they had meeting with both of her sons today and the family decided to transition to comfort care.  Hypokalemia: Resolved  In agreement with assessment of the pressure ulcer as below:  Pressure Injury 10/01/2021 Buttocks Bilateral Stage 2 -  Partial thickness loss of dermis presenting as a shallow open injury with a red, pink wound bed  without slough. (Active)  09/20/2021 1900  Location: Buttocks  Location Orientation: Bilateral  Staging: Stage 2 -  Partial thickness loss of dermis presenting as a shallow open injury with a red, pink wound bed without slough.  Wound Description (Comments):   Present on Admission: Yes   Dressing Type [name] 09/21/21 1936     Pressure Injury 10/09/2021 Sacrum Stage 2 -  Partial thickness loss of dermis presenting as a shallow open injury with a red, pink wound bed without slough. (Active)  09/21/2021 1900  Location: Sacrum  Location Orientation:   Staging: Stage 2 -  Partial thickness loss of dermis presenting as a shallow open injury with a red, pink wound bed without slough.  Wound Description (Comments):   Present on Admission: Yes  Dressing Type [name] 09/21/21 1936     Pressure Injury 10/12/2021 Heel Right Stage 1 -  Intact skin with non-blanchable redness of a localized area usually over a bony prominence. (Active)  09/16/2021 1900  Location: Heel  Location Orientation: Right  Staging: Stage 1 -  Intact skin with non-blanchable redness of a localized area usually over a bony prominence.  Wound Description (Comments):   Present on Admission:   Dressing Type Foam - Lift dressing to assess site every shift 09/21/21 1936     Pressure Injury 09/26/2021 Heel Left Deep Tissue Pressure Injury - Purple or maroon localized area of discolored intact skin or blood-filled blister due to damage of underlying soft tissue from pressure and/or shear. (Active)  09/26/2021 1900  Location: Heel  Location Orientation: Left  Staging: Deep Tissue Pressure Injury - Purple or maroon localized area of discolored intact skin or blood-filled blister due to damage of underlying soft tissue from pressure and/or shear.  Wound Description (Comments):   Present on Admission: Yes  Dressing Type Foam - Lift dressing to assess site every shift 09/21/21 1936     Nutrition Problem: Moderate Malnutrition Etiology: chronic illness (CHF, dysphagia)   DVT prophylaxis:    Code Status: Full Family Communication: [name] at bedside.  Called and discussed with patient's son and [name] over the phone Disposition Plan:  Status is: Inpatient Remains inpatient appropriate because: Patient now comfort  care  Consultants:  PCCM Cardiology    Antimicrobials:  Anti-infectives (From admission, onward)    Start     Dose/Rate Route Frequency Ordered Stop   09/20/21 0700  vancomycin (VANCOCIN) IVPB 1000 mg/200 mL premix  Status:  Discontinued        1,000 mg 200 mL/hr over 60 Minutes Intravenous Every 24 hours 10/10/2021 1110 09/22/21 1250   10/04/2021 1800  metroNIDAZOLE (FLAGYL) IVPB 500 mg  Status:  Discontinued        500 mg 100 mL/hr over 60 Minutes Intravenous Every 12 hours 09/28/2021 1042 09/16/2021 1118   09/29/2021 1700  ceFEPIme (MAXIPIME) 2 g in sodium chloride 0.9 % 100 mL IVPB  Status:  Discontinued        2 g 200 mL/hr over 30 Minutes Intravenous Every 12 hours 09/16/2021 1047 09/20/21 0905   10/11/2021 1400  ceFEPIme (MAXIPIME) 2 g in sodium chloride 0.9 % 100 mL IVPB  Status:  Discontinued        2 g 200 mL/hr over 30 Minutes Intravenous Every 8 hours 09/17/2021 1042 10/07/2021 1047   10/08/2021 0515  vancomycin (VANCOREADY) IVPB 1500 mg/300 mL        1,500 mg 150 mL/hr over 120 Minutes Intravenous  Once 09/18/2021 0506 09/30/2021 0925   09/14/2021 0500  ceFEPIme (MAXIPIME) 2 g in sodium chloride 0.9 % 100 mL IVPB        2 g 200 mL/hr over 30 Minutes Intravenous  Once 09/22/2021 0453 10/13/2021 0638   10/03/2021 0500  metroNIDAZOLE (FLAGYL) IVPB 500 mg        500 mg 100 mL/hr over 60 Minutes Intravenous  Once 09/18/2021 0453 10/02/2021 0710   09/16/2021 0500  vancomycin (VANCOCIN) IVPB 1000 mg/200 mL premix  Status:  Discontinued        1,000 mg 200 mL/hr over 60 Minutes Intravenous  Once 10/11/2021 0453 09/15/2021 0503        Objective: Vitals:   09/21/21 1959 09/22/21 0430 09/22/21 0447 09/22/21 0911  BP: 129/84 128/74  (!) 151/87  Pulse: (!) 110 93  (!) 109  Resp: '20 19  17  '$ Temp: 98.1 F (36.7 C) 97.7 F (36.5 C)  98.5 F (36.9 C)  TempSrc: Axillary Axillary  Oral  SpO2: 95% 96%  96%  Weight:   66.5 kg   Height:        Intake/Output Summary (Last 24 hours) at 09/22/2021 1317 Last data  filed at 09/22/2021 0700 Gross per 24 hour  Intake 2597.74 ml  Output 450 ml  Net 2147.74 ml    Filed Weights   09/20/21 0500 09/21/21 0424 09/22/21 0447  Weight: 68.2 kg 67.6 kg 66.5 kg    Examination:   General exam: Appears tearful and suffering in pain Respiratory system: Clear to auscultation. Respiratory effort normal. Cardiovascular system: S1 & S2 heard, RRR. No JVD, murmurs, rubs, gallops or clicks. No pedal edema. Gastrointestinal system: Abdomen is soft, nondistended but tender to palpation, mainly around PEG tube site, she has copious discharge which is foul-smelling coming from the PEG tube site and she has erythema around that as well. Central nervous system: Alert and oriented. No focal neurological deficits. Extremities: Symmetric 5 x 5 power. Skin: No rashes, lesions or ulcers.  Psychiatry: Judgement and insight appear normal. Mood & affect appropriate.    Data Reviewed: I have personally reviewed following labs and imaging studies  CBC: Recent Labs  Lab 09/21/2021 0458 10/07/2021 0742 09/20/21 0423 09/21/21 0440  WBC 6.3  --  10.9* 19.3*  NEUTROABS 5.7  --   --   --   HGB 14.4 9.9* 11.6* 9.6*  HCT 50.2* 29.0* 40.8 32.2*  MCV 98.4  --  99.0 94.2  PLT 204  --  106* 166    Basic Metabolic Panel: Recent Labs  Lab 09/27/2021 0952 09/20/21 0423 09/20/21 2120 09/21/21 0440 09/22/21 0404  NA 152* 150* 144 143 146*  K 4.5 4.4 3.1* 3.0* 3.1*  CL 115* 108 107 108 111  CO2 '30 27 27 28 26  '$ GLUCOSE 103* 128* 157* 145* 106*  BUN 47* 50* 49* 52* 52*  CREATININE 1.25* 1.28* 1.05* 1.06* 0.95  CALCIUM 8.1* 8.4* 8.3* 8.1* 8.1*  MG  --  1.9  --  1.8  --   PHOS  --  4.1  --   --   --     GFR: Estimated Creatinine Clearance: 57.5 mL/min (by C-G formula based on SCr of 0.95 mg/dL). Liver Function Tests: Recent Labs  Lab 10/11/2021 0458  AST 77*  ALT 24  ALKPHOS 108  BILITOT 0.6  PROT 9.8*  ALBUMIN 1.8*    No results for input(s): "LIPASE", "AMYLASE" in the  last 168 hours. No results for input(s): "AMMONIA" in the last 168 hours. Coagulation Profile: Recent Labs  Lab  09/25/2021 0458  INR 1.2    Cardiac Enzymes: No results for input(s): "CKTOTAL", "CKMB", "CKMBINDEX", "TROPONINI" in the last 168 hours. BNP (last 3 results) No results for input(s): "PROBNP" in the last 8760 hours. HbA1C: No results for input(s): "HGBA1C" in the last 72 hours. CBG: Recent Labs  Lab 09/21/21 1954 09/22/21 0007 09/22/21 0427 09/22/21 0850 09/22/21 1141  GLUCAP 170* 137* 107* 78 64*    Lipid Profile: No results for input(s): "CHOL", "HDL", "LDLCALC", "TRIG", "CHOLHDL", "LDLDIRECT" in the last 72 hours. Thyroid Function Tests: No results for input(s): "TSH", "T4TOTAL", "FREET4", "T3FREE", "THYROIDAB" in the last 72 hours. Anemia Panel: No results for input(s): "VITAMINB12", "FOLATE", "FERRITIN", "TIBC", "IRON", "RETICCTPCT" in the last 72 hours. Sepsis Labs: Recent Labs  Lab 09/26/2021 0952 10/12/2021 0953 09/20/2021 1223 09/20/21 0800 09/20/21 0948 09/20/21 2120  PROCALCITON 0.47  --   --   --   --   --   LATICACIDVEN  --    < > 5.0* 3.8* 4.2* 2.5*   < > = values in this interval not displayed.     Recent Results (from the past 240 hour(s))  Blood Culture (routine x 2)     Status: [name] (Preliminary result)   Collection Time: 10/10/2021  4:58 AM   Specimen: BLOOD  Result Value Ref Range Status   Specimen Description BLOOD LEFT ANTECUBITAL  Final   Special Requests   Final    BOTTLES DRAWN AEROBIC AND ANAEROBIC Blood Culture results may not be optimal due to an excessive volume of blood received in culture bottles   Culture   Final    NO GROWTH 3 DAYS Performed at [facility], [place] [address], [place], [place] [address]    Report Status PENDING  Incomplete  Blood Culture (routine x 2)     Status: Abnormal   Collection Time: 10/14/2021  5:41 AM   Specimen: BLOOD  Result Value Ref Range Status   Specimen Description BLOOD SITE NOT  SPECIFIED  Final   Special Requests   Final    BOTTLES DRAWN AEROBIC AND ANAEROBIC Blood Culture adequate volume   Culture  Setup Time   Final    GRAM POSITIVE COCCI AEROBIC BOTTLE ONLY CRITICAL RESULT CALLED TO, READ BACK BY AND VERIFIED WITH: T RUDISILL,PHARMD'@0605'$  09/20/21 [place] Performed at [facility], [place] [address] [place], [place] [address]    Culture METHICILLIN RESISTANT STAPHYLOCOCCUS AUREUS (A)  Final   Report Status 09/22/2021 FINAL  Final   Organism ID, Bacteria METHICILLIN RESISTANT STAPHYLOCOCCUS AUREUS  Final      Susceptibility   Methicillin resistant staphylococcus aureus - MIC*    CIPROFLOXACIN >=8 RESISTANT Resistant     ERYTHROMYCIN >=8 RESISTANT Resistant     GENTAMICIN <=0.5 SENSITIVE Sensitive     OXACILLIN >=4 RESISTANT Resistant     TETRACYCLINE <=1 SENSITIVE Sensitive     VANCOMYCIN 1 SENSITIVE Sensitive     TRIMETH/SULFA >=320 RESISTANT Resistant     CLINDAMYCIN <=0.25 SENSITIVE Sensitive     RIFAMPIN <=0.5 SENSITIVE Sensitive     Inducible Clindamycin NEGATIVE Sensitive     * METHICILLIN RESISTANT STAPHYLOCOCCUS AUREUS  Blood Culture ID Panel (Reflexed)     Status: Abnormal   Collection Time: 09/14/2021  5:41 AM  Result Value Ref Range Status   Enterococcus faecalis NOT DETECTED NOT DETECTED Final   Enterococcus Faecium NOT DETECTED NOT DETECTED Final   Listeria monocytogenes NOT DETECTED NOT DETECTED Final   Staphylococcus species DETECTED (A) NOT  DETECTED Final    Comment: CRITICAL RESULT CALLED TO, READ BACK BY AND VERIFIED WITH: T RUDISILL,PHARMD'@0605'$  09/20/21 [place]    Staphylococcus aureus (BCID) DETECTED (A) NOT DETECTED Final    Comment: Methicillin (oxacillin)-resistant Staphylococcus aureus (MRSA). MRSA is predictably resistant to beta-lactam antibiotics (except ceftaroline). Preferred therapy is vancomycin unless clinically contraindicated. Patient requires contact precautions if  hospitalized. CRITICAL RESULT CALLED TO, READ BACK BY AND  VERIFIED WITH: T RUDISILL,PHARMD'@0605'$  09/20/21 [place]    Staphylococcus epidermidis NOT DETECTED NOT DETECTED Final   Staphylococcus lugdunensis NOT DETECTED NOT DETECTED Final   Streptococcus species NOT DETECTED NOT DETECTED Final   Streptococcus agalactiae NOT DETECTED NOT DETECTED Final   Streptococcus pneumoniae NOT DETECTED NOT DETECTED Final   Streptococcus pyogenes NOT DETECTED NOT DETECTED Final   A.calcoaceticus-baumannii NOT DETECTED NOT DETECTED Final   Bacteroides fragilis NOT DETECTED NOT DETECTED Final   Enterobacterales NOT DETECTED NOT DETECTED Final   Enterobacter cloacae complex NOT DETECTED NOT DETECTED Final   Escherichia coli NOT DETECTED NOT DETECTED Final   Klebsiella aerogenes NOT DETECTED NOT DETECTED Final   Klebsiella oxytoca NOT DETECTED NOT DETECTED Final   Klebsiella pneumoniae NOT DETECTED NOT DETECTED Final   Proteus species NOT DETECTED NOT DETECTED Final   Salmonella species NOT DETECTED NOT DETECTED Final   Serratia marcescens NOT DETECTED NOT DETECTED Final   Haemophilus influenzae NOT DETECTED NOT DETECTED Final   Neisseria meningitidis NOT DETECTED NOT DETECTED Final   Pseudomonas aeruginosa NOT DETECTED NOT DETECTED Final   Stenotrophomonas maltophilia NOT DETECTED NOT DETECTED Final   Candida albicans NOT DETECTED NOT DETECTED Final   Candida auris NOT DETECTED NOT DETECTED Final   Candida glabrata NOT DETECTED NOT DETECTED Final   Candida krusei NOT DETECTED NOT DETECTED Final   Candida parapsilosis NOT DETECTED NOT DETECTED Final   Candida tropicalis NOT DETECTED NOT DETECTED Final   Cryptococcus neoformans/gattii NOT DETECTED NOT DETECTED Final   Meth resistant mecA/C and MREJ DETECTED (A) NOT DETECTED Final    Comment: CRITICAL RESULT CALLED TO, READ BACK BY AND VERIFIED WITH: T RUDISILL,PHARMD'@0605'$  09/20/21 [place] Performed at [facility], [address] [place], [place] [address]   Urine Culture     Status: [name]   Collection Time:  10/11/2021  8:03 AM   Specimen: In/Out Cath Urine  Result Value Ref Range Status   Specimen Description IN/OUT CATH URINE  Final   Special Requests [name]  Final   Culture   Final    NO GROWTH Performed at [facility], [place] [address], [place], [place] [address]    Report Status 09/20/2021 FINAL  Final  C Difficile Quick Screen w PCR reflex     Status: [name]   Collection Time: 09/20/21 12:18 PM   Specimen: STOOL  Result Value Ref Range Status   C Diff antigen NEGATIVE NEGATIVE Final   C Diff toxin NEGATIVE NEGATIVE Final   C Diff interpretation No C. difficile detected.  Final    Comment: NEGATIVE Performed at [facility], [place] [address] [place],  [address]   MRSA Next Gen by PCR, Nasal     Status: Abnormal   Collection Time: 09/20/21 12:54 PM   Specimen: Nasal Mucosa; Nasal Swab  Result Value Ref Range Status   MRSA by PCR Next Gen DETECTED (A) NOT DETECTED Final    Comment: RESULT CALLED TO, READ BACK BY AND VERIFIED WITH: RN Saul Fordyce [phone] @ [phone] FH  (NOTE) The GeneXpert MRSA  Assay (FDA approved for NASAL specimens only), is one component of a comprehensive MRSA colonization surveillance program. It is not intended to diagnose MRSA infection nor to guide or monitor treatment for MRSA infections. Test performance is not FDA approved in patients less than 72 years old. Performed at [facility], [place] [address] [place], [place] [address]   Culture, blood (Routine X 2) w Reflex to ID Panel     Status: [name] (Preliminary result)   Collection Time: 09/21/21  4:40 AM   Specimen: BLOOD LEFT HAND  Result Value Ref Range Status   Specimen Description BLOOD LEFT HAND  Final   Special Requests IN PEDIATRIC BOTTLE Blood Culture adequate volume  Final   Culture   Final    NO GROWTH 1 DAY Performed at [facility], [place] [address], [place], [place] [address]    Report Status PENDING  Incomplete  Culture, blood (Routine X 2) w Reflex to ID Panel      Status: [name] (Preliminary result)   Collection Time: 09/21/21  4:53 AM   Specimen: BLOOD  Result Value Ref Range Status   Specimen Description BLOOD RIGHT ANTECUBITAL  Final   Special Requests IN PEDIATRIC BOTTLE Blood Culture adequate volume  Final   Culture   Final    NO GROWTH 1 DAY Performed at [facility], [place] [address] [place], [place] [address]    Report Status PENDING  Incomplete      Radiology Studies: ECHOCARDIOGRAM COMPLETE  Result Date: 09/21/2021    ECHOCARDIOGRAM REPORT   Patient Name:   PENINA REISNER Date of Exam: 09/21/2021 Medical Rec #:  [id number]       Height:       66.0 in Accession #:    [id number]      Weight:       149.0 lb Date of Birth:  04-02-1960       BSA:          1.765 m Patient Age:    89 years        BP:           120/71 mmHg Patient Gender: F               HR:           103 bpm. Exam Location:  Inpatient Procedure: 2D Echo, Cardiac Doppler, Color Doppler and 3D Echo Indications:    Bacteremia  History:        Patient has prior history of Echocardiogram examinations, most                 recent 10/05/2015.  Sonographer:    Merrie Roof RDCS Referring Phys: [id number] [place]  1. Left ventricular ejection fraction, by estimation, is 40 to 45%. Left ventricular ejection fraction by 3D volume is 41 %. The left ventricle has mildly decreased function. The left ventricle demonstrates regional wall motion abnormalities (see scoring diagram/findings for description). Indeterminate diastolic filling due to E-A fusion.  2. Right ventricular systolic function is normal. The right ventricular size is normal. Tricuspid regurgitation signal is inadequate for assessing PA pressure.  3. The mitral valve is grossly normal. Mild mitral valve regurgitation. No evidence of mitral stenosis.  4. The aortic valve is tricuspid. There is mild calcification of the aortic valve. Aortic valve regurgitation is not visualized. Aortic valve sclerosis is present, with no  evidence of aortic valve stenosis.  5. The inferior vena cava is  normal in size with greater than 50% respiratory variability, suggesting right atrial pressure of 3 mmHg. Comparison(s): No significant change from prior study. Conclusion(s)/Recommendation(s): No evidence of valvular vegetations on this transthoracic echocardiogram. Consider a transesophageal echocardiogram to exclude infective endocarditis if clinically indicated. FINDINGS  Left Ventricle: Left ventricular ejection fraction, by estimation, is 40 to 45%. Left ventricular ejection fraction by 3D volume is 41 %. The left ventricle has mildly decreased function. The left ventricle demonstrates regional wall motion abnormalities. The left ventricular internal cavity size was normal in size. There is no left ventricular hypertrophy. Indeterminate diastolic filling due to E-A fusion.  LV Wall Scoring: The inferior wall and posterior wall are akinetic. Right Ventricle: The right ventricular size is normal. No increase in right ventricular wall thickness. Right ventricular systolic function is normal. Tricuspid regurgitation signal is inadequate for assessing PA pressure. Left Atrium: Left atrial size was normal in size. Right Atrium: Right atrial size was normal in size. Pericardium: There is no evidence of pericardial effusion. Mitral Valve: The mitral valve is grossly normal. Mild mitral valve regurgitation. No evidence of mitral valve stenosis. There is no evidence of mitral valve vegetation. Tricuspid Valve: The tricuspid valve is grossly normal. Tricuspid valve regurgitation is trivial. No evidence of tricuspid stenosis. There is no evidence of tricuspid valve vegetation. Aortic Valve: The aortic valve is tricuspid. There is mild calcification of the aortic valve. Aortic valve regurgitation is not visualized. Aortic valve sclerosis is present, with no evidence of aortic valve stenosis. Aortic valve mean gradient measures 10.0 mmHg. Aortic valve peak  gradient measures 19.2 mmHg. Aortic valve area, by VTI measures 1.39 cm. There is no evidence of aortic valve vegetation. Pulmonic Valve: The pulmonic valve was grossly normal. Pulmonic valve regurgitation is not visualized. No evidence of pulmonic stenosis. Aorta: The aortic root is normal in size and structure. Venous: The inferior vena cava is normal in size with greater than 50% respiratory variability, suggesting right atrial pressure of 3 mmHg. IAS/Shunts: The atrial septum is grossly normal.  LEFT VENTRICLE PLAX 2D LVIDd:         5.10 cm         Diastology LVIDs:         3.80 cm         LV e' medial:    8.95 cm/s LV PW:         1.00 cm         LV E/e' medial:  11.6 LV IVS:        1.20 cm         LV e' lateral:   11.00 cm/s LVOT diam:     1.80 cm         LV E/e' lateral: 9.5 LV SV:         48 LV SV Index:   27 LVOT Area:     2.54 cm        3D Volume EF                                LV 3D EF:    Left                                             ventricul LV Volumes (MOD)  ar LV vol d, MOD    130.0 ml                   ejection A2C:                                        fraction LV vol d, MOD    122.0 ml                   by 3D A4C:                                        volume is LV vol s, MOD    84.5 ml                    41 %. A2C: LV vol s, MOD    55.3 ml A4C:                           3D Volume EF: LV SV MOD A2C:   45.5 ml       3D EF:        41 % LV SV MOD A4C:   122.0 ml      LV EDV:       178 ml LV SV MOD BP:    61.5 ml       LV ESV:       105 ml                                LV SV:        73 ml RIGHT VENTRICLE RV Basal diam:  3.20 cm RV S prime:     14.50 cm/s TAPSE (M-mode): 1.4 cm LEFT ATRIUM             Index        RIGHT ATRIUM           Index LA diam:        3.90 cm 2.21 cm/m   RA Area:     14.40 cm LA Vol (A2C):   82.0 ml 46.46 ml/m  RA Volume:   33.80 ml  19.15 ml/m LA Vol (A4C):   27.6 ml 15.64 ml/m LA Biplane Vol: 48.1 ml 27.26 ml/m  AORTIC VALVE AV Area  (Vmax):    1.32 cm AV Area (Vmean):   1.36 cm AV Area (VTI):     1.39 cm AV Vmax:           219.00 cm/s AV Vmean:          146.000 cm/s AV VTI:            0.347 m AV Peak Grad:      19.2 mmHg AV Mean Grad:      10.0 mmHg LVOT Vmax:         114.00 cm/s LVOT Vmean:        77.900 cm/s LVOT VTI:          0.189 m LVOT/AV VTI ratio: 0.54  AORTA Ao Root diam: 3.30 cm Ao Asc diam:  3.20 cm MITRAL VALVE MV Area (PHT): 5.13 cm     SHUNTS MV Decel Time: 148 msec  Systemic VTI:  0.19 m MV E velocity: 104.00 cm/s  Systemic Diam: 1.80 cm MV A velocity: 148.00 cm/s MV E/A ratio:  0.70 Eleonore Chiquito [name] Electronically signed by Eleonore Chiquito [name] Signature Date/Time: 09/21/2021/4:23:46 PM    Final    DG ABDOMEN PEG TUBE LOCATION  Result Date: 09/21/2021 CLINICAL DATA:  PEG tube location EXAM: ABDOMEN - 1 VIEW COMPARISON:  [name] Available. FINDINGS: Contrast is administered via percutaneous gastrostomy tube. Tip is within the distal gastric body, with opacification of the gastric lumen. No extraluminal contrast identified. General paucity of included bowel gas. No obvious free air. IMPRESSION: Contrast is administered via percutaneous gastrostomy tube. Tip is within the distal gastric body, with opacification of the gastric lumen. No extraluminal contrast identified. Electronically Signed   By: Delanna Ahmadi M.D.   On: 09/21/2021 12:54   CT ABDOMEN PELVIS W CONTRAST  Result Date: 09/20/2021 CLINICAL DATA:  Intra-abdominal abscess, sepsis EXAM: CT ABDOMEN AND PELVIS WITH CONTRAST TECHNIQUE: Multidetector CT imaging of the abdomen and pelvis was performed using the standard protocol following bolus administration of intravenous contrast. RADIATION DOSE REDUCTION: This exam was performed according to the departmental dose-optimization program which includes automated exposure control, adjustment of the mA and/or kV according to patient size and/or use of iterative reconstruction technique. CONTRAST:  72m OMNIPAQUE IOHEXOL 300  MG/ML  SOLN COMPARISON:  08/14/2021 FINDINGS: Lower chest: Small right pleural effusion. Dependent bilateral lower lobe consolidation, left greater than right, consistent with pneumonia and/or atelectasis. Heart is enlarged without pericardial effusion. Hepatobiliary: No focal liver abnormality is seen. No gallstones, gallbladder wall thickening, or biliary dilatation. Pancreas: Unremarkable. No pancreatic ductal dilatation or surrounding inflammatory changes. Spleen: Normal in size without focal abnormality. Adrenals/Urinary Tract: Bladder is decompressed with a Foley catheter. Renals are unremarkable. Kidneys enhance normally without urinary tract calculi or obstruction. Stomach/Bowel: No bowel obstruction or ileus. No bowel wall thickening or inflammatory change. Percutaneous gastrostomy tube within the gastric lumen Vascular/Lymphatic: Aortic atherosclerosis. No enlarged abdominal or pelvic lymph nodes. Subcentimeter inguinal lymph nodes are likely reactive. Reproductive: Status post hysterectomy. No adnexal masses. Other: Trace pelvic free fluid. No free intraperitoneal gas. No abdominal wall hernia. Musculoskeletal: No acute or destructive bony lesions. Reconstructed images demonstrate no additional findings. IMPRESSION: 1. Bilateral lower lobe consolidation, left greater than right, favor combination of pneumonia and atelectasis. 2. Small right pleural effusion. 3. Trace pelvic free fluid, nonspecific. 4. Likely reactive bilateral inguinal lymph nodes. No pathologic adenopathy. 5.  Aortic Atherosclerosis (ICD10-I70.0). Electronically Signed   By: MRanda NgoM.D.   On: 09/20/2021 17:18      Scheduled Meds:  glycopyrrolate  0.4 mg Intravenous Q4H    HYDROmorphone (DILAUDID) injection  1 mg Intravenous Q4H   LORazepam  1 mg Intravenous Q4H   mouth rinse  15 mL Mouth Rinse 4 times per day   scopolamine  1 patch Transdermal Q72H   Continuous Infusions:     LOS: 3 days   RDarliss Cheney [name] 09/22/2021, 1:17 PM   Available via Epic secure chat 7am-7pm After these hours, please refer to coverage provider listed on [email or web address]

## 2021-09-22 NOTE — Progress Notes (Addendum)
Palliative Medicine Inpatient Follow Up Note   HPI: [name] [name] is a 61 y.o. female with medical history significant of CAD s/p CABG; depression/anxiety; HTN; HLD; SLE; psoriasis with psoriatic arthritis; RA; OSA not on CPAP; CVA; esophageal dysmotility/dysphagia; chronic systolic CHF; and DM. Presently being treated for severe sepsis. Palliative care has been asked to get involved to further address goals of care in the setting of ongoing aspiration.   Today's Discussion 09/22/2021  *Please note that this is a verbal dictation therefore any spelling or grammatical errors are due to the "[place] [employer]" system interpretation.  Chart reviewed inclusive of vital signs, progress notes, laboratory results, and diagnostic images.   I met this after noon with [name] [name] and her son(s): [name] [name] and [name] [name].  A formal conversation we held in the setting of [name] [name]'s poor physical state.  We reviewed the impact of [name] [name]'s dysphagia and esophageal dysmotility.  We discussed that this is very worrisome in the setting of her recurrent aspiration pneumonias.  I shared that I wish we could fix this problem though at this juncture it does not appear to be something that we are able to remediate.  We discussed that I understand extensive conversations were held prior to placing patient's gastrostomy tube -we reviewed that unfortunately the gastrostomy tube appears to have ruptured the skin significantly and is presently not functional.  Further conversations were held in the setting of [name] [name]'s horrific pain with movement.  We reviewed that she has terrible dermatitis on her buttock region and perianal region.  We reviewed that her skin is quite raw and causing her a lot of discomfort though there is concern about giving medications in the setting of her tenuous respiratory state.  We further reviewed that she is overall failing and in many contacts slowly transitioning from this life to the next.  We reviewed  cardiopulmonary resuscitation again and how a full effort would not benefit [name] [name] at this point in time.  We discussed that it would impact her negatively creating a far worse functional baseline than she is already at.  We further discussed whether or not we should continue aggressive measures being that she is recurrently come into the hospital and is overall declining significantly.  There was a tremendous amount of time utilized for reflective listening.  Ultimately [name] [name] was clear about her desire to be comfortable.  We further discussed what that meant in the context of hospitalization.We talked about transition to comfort measures in house and what that would entail inclusive of medications to control pain, dyspnea, agitation, nausea, itching, and hiccups.  We discussed stopping all uneccessary measures such as cardiac monitoring, blood draws, needle sticks, and frequent vital signs.   I shared I suspect [name] [name] only has days left to live which was very very difficult for her sons to hear.  Utilized reflective listening throughout our time together and provided support.  We discussed the importance of all family and friends who know and love [name] [name] coming in sooner than later.  We reviewed that at this time [name] [name] is appropriate for a medication drips though being that family wants to see her we will hold off to make today - her cognition is clear as possible prior to the oncoming days which anticipate she will become more lethargic and eventually drift off.  Medication orders have been placed and primary team has been informed of the conversation held as above.   Objective Assessment: Vital Signs Vitals:   09/22/21 0430 09/22/21 0911  BP: 128/74 (!) 151/87  Pulse: 93 (!) 109  Resp: 19 17  Temp: 97.7 F (36.5 C) 98.5 F (36.9 C)  SpO2: 96% 96%    Intake/Output Summary (Last 24 hours) at 09/22/2021 1330 Last data filed at 09/22/2021 0700 Gross per 24 hour  Intake 2597.74 ml  Output 450 ml   Net 2147.74 ml    Last Weight  Most recent update: 09/22/2021  4:47 AM    Weight  66.5 kg (146 lb 9.7 oz)            Gen: Older African-American female in moderate distress HEENT: moist mucous membranes CV: Regular rate and rhythm PULM: On 4 L a minute of supplemental oxygen, notable wet cough with copious secretions ABD: soft/nontender EXT: No edema Neuro: Alert and oriented to person place and situation incrementally drowsy  SUMMARY OF RECOMMENDATIONS   DNAR/DNI  Comfort care  Comfort medications per MAR --> I have added low-dose Dilaudid and Ativan around-the-clock.  Patient is low threshold for initiation of a Dilaudid drip though family and I decided to hold off in an effort to make her has coherent as possible during family visits tonight  Ongoing palliative support  Prognosis estimated days anticipate in-hospital death  Time Spent: 72  Billing based on MDM: High ______________________________________________________________________________________ [place] Team Team Cell Phone: [phone] Please utilize secure chat with additional questions, if there is no response within 30 minutes please call the above phone number  Palliative Medicine Team providers are available by phone from 7am to 7pm daily and can be reached through the team cell phone.  Should this patient require assistance outside of these hours, please call the patient's attending physician.

## 2021-09-22 NOTE — Progress Notes (Signed)
Pharmacy Antibiotic Note  [name] [name] is a 61 y.o. female admitted on 10/01/2021 with sepsis 2/2 MRSA bacteremia with potential sources being aspiration vs. Sacral decubitus ulcers. Patient remains afebrile, however WBC have increased to 19.3. Renal function continues to improve. Pharmacy has been consulted for vancomycin dosing.  AUC goal: 400-550  Plan: Continue Vancomycin 1,000 mg IV Q24H (eAUC 400, Scr used 0.95, Vd 0.72)  Monitor renal function, F/U levels (0830 VP on 7/9, 0630 VT on 7/10)  F/U GOC discussion F/U ID recs for LOT   Height: '5\' 6"'$  (167.6 cm) Weight: 66.5 kg (146 lb 9.7 oz) IBW/kg (Calculated) : 59.3  Temp (24hrs), Avg:98.1 F (36.7 C), Min:97.7 F (36.5 C), Max:98.4 F (36.9 C)  Recent Labs  Lab 09/30/2021 0458 10/13/2021 0952 10/07/2021 0953 10/01/2021 1223 09/20/21 0423 09/20/21 0800 09/20/21 0948 09/20/21 2120 09/21/21 0440 09/22/21 0404  WBC 6.3  --   --   --  10.9*  --   --   --  19.3*  --   CREATININE 1.30* 1.25*  --   --  1.28*  --   --  1.05* 1.06* 0.95  LATICACIDVEN 3.5*  --  2.2* 5.0*  --  3.8* 4.2* 2.5*  --   --      Estimated Creatinine Clearance: 57.5 mL/min (by C-G formula based on SCr of 0.95 mg/dL).    Allergies  Allergen Reactions   Apremilast Other (See Comments)    unknown   Latex Itching, Swelling and Rash    Antimicrobials this admission: Vancomycin 7/6 > Cefepime 7/6 >7/7 Flagyl 7/6   Dose adjustments this admission:  Microbiology results: 7/6 Bcx 1/4 mrsa 7/7 C diff negative 7/7 S PNA Ag negative    Thank you for allowing pharmacy to be a part of this patient's care.  Adria Dill, PharmD PGY-2 Infectious Diseases Resident  09/22/2021 7:26 AM

## 2021-09-23 DIAGNOSIS — B9562 Methicillin resistant Staphylococcus aureus infection as the cause of diseases classified elsewhere: Secondary | ICD-10-CM | POA: Diagnosis not present

## 2021-09-23 DIAGNOSIS — R7881 Bacteremia: Secondary | ICD-10-CM | POA: Diagnosis not present

## 2021-09-24 LAB — CULTURE, BLOOD (ROUTINE X 2): Culture: NO GROWTH

## 2021-09-26 LAB — CULTURE, BLOOD (ROUTINE X 2)
Culture: NO GROWTH
Culture: NO GROWTH
Special Requests: ADEQUATE
Special Requests: ADEQUATE

## 2021-10-15 NOTE — Death Summary Note (Signed)
DEATH SUMMARY   Patient Details  Name: [name] [name] MRN: [id number] DOB: Jul 22, 1960 [id number], Ronie Spies, MD Admission/Discharge Information   Admit Date:  10/14/21  Date of Death: 10/18/2021  Time of Death: 10:54 AM  Length of Stay: 4   Principle Cause of death: Severe sepsis secondary to MRSA bacteremia  Hospital Diagnoses: Principal Problem:   MRSA bacteremia Active Problems:   Hyperlipidemia with target LDL less than 70   Accelerated hypertension   Insulin-requiring or dependent type II diabetes mellitus ([place])   Pressure injury of skin   CAD (coronary artery disease) of artery bypass graft   AKI (acute kidney injury) ([place])   Chronic combined systolic and diastolic CHF (congestive heart failure) (HCC)   Dysphagia   Severe sepsis (HCC)   Aspiration pneumonia ([place])   Dehydration with hypernatremia   Respiratory failure with hypoxia ([place])   Demand ischemia [facility])   Hospital Course: [name] [name] is a 61 y.o. female with medical history significant of CAD s/p CABG; depression/anxiety; HTN; HLD; SLE; psoriasis with psoriatic arthritis; RA; OSA not on CPAP; CVA; esophageal dysmotility/dysphagia; chronic systolic CHF; and DM presented with CP, hypoxia to 87%.     She was last admitted from 5/22-6/13 with aspiration PNA and ESBL E coli UTI with bacteremia.  She was treated with Meropenem -> Augmentin and was able to wean off O2.  She also had a large R-sided pleural effusion s/p thoracentesis.  She has had persistent hematemesis since and was seen for this in the ER on 6/27.     Patient was admitted with severe sepsis , detailed hospitalization as below.   Severe sepsis secondary to aspiration pneumonia and MRSA bacteremia, sepsis present on admission -Sepsis present on admission with fever 102.7, tachycardia 130s, tachypnea 20s, respiratory failure and lactic acid peak of 5.0 -Infectious disease was following. -Has purulent discharge from PEG tube site.  CT abdomen  pelvis completed which ruled out any abscess.  She was treated with IV vancomycin.  Source of bacteremia remains unclear.   Hypernatremia Resolved.   Oropharyngeal dysphagia, esophageal dysmotility -Major contributor to recurrent aspiration and aspiration pneumonitis -She was NPO.  Had PEG tube.   Diarrhea Improving.  C. difficile ruled out.   AKI  Resolved   Demand ischemia -Troponin 98, 111 -Insetting of sepsis   CAD status post CABG -Aspirin   Hypertension  -Lopressor on hold   Hyperlipidemia -Lipitor   Chronic combined systolic and diastolic heart failure -Echo on 05/01/21 was 45% with grade 1 diastolic dysfunction.  This was stable.   Goals of care -Palliative care consulted and they had meeting with both of her sons on 09/22/2021 and the family decided to transition to comfort care.  Patient passed away at 10:54 AM today on Oct 18, 2021.       Procedures: None  Consultations: ID and palliative care  The results of significant diagnostics from this hospitalization (including imaging, microbiology, ancillary and laboratory) are listed below for reference.   Significant Diagnostic Studies: ECHOCARDIOGRAM COMPLETE  Result Date: 09/21/2021    ECHOCARDIOGRAM REPORT   Patient Name:   [name] [name] Date of Exam: 09/21/2021 Medical Rec #:  [id number]       Height:       66.0 in Accession #:    [id number]      Weight:       149.0 lb Date of Birth:  07/19/1960       BSA:  1.765 m Patient Age:    86 years        BP:           120/71 mmHg Patient Gender: F               HR:           103 bpm. Exam Location:  Inpatient Procedure: 2D Echo, Cardiac Doppler, Color Doppler and 3D Echo Indications:    Bacteremia  History:        Patient has prior history of Echocardiogram examinations, most                 recent 10/05/2015.  Sonographer:    Merrie Roof RDCS Referring Phys: [id number] [place]  1. Left ventricular ejection fraction, by estimation, is 40 to 45%. Left  ventricular ejection fraction by 3D volume is 41 %. The left ventricle has mildly decreased function. The left ventricle demonstrates regional wall motion abnormalities (see scoring diagram/findings for description). Indeterminate diastolic filling due to E-A fusion.  2. Right ventricular systolic function is normal. The right ventricular size is normal. Tricuspid regurgitation signal is inadequate for assessing PA pressure.  3. The mitral valve is grossly normal. Mild mitral valve regurgitation. No evidence of mitral stenosis.  4. The aortic valve is tricuspid. There is mild calcification of the aortic valve. Aortic valve regurgitation is not visualized. Aortic valve sclerosis is present, with no evidence of aortic valve stenosis.  5. The inferior vena cava is normal in size with greater than 50% respiratory variability, suggesting right atrial pressure of 3 mmHg. Comparison(s): No significant change from prior study. Conclusion(s)/Recommendation(s): No evidence of valvular vegetations on this transthoracic echocardiogram. Consider a transesophageal echocardiogram to exclude infective endocarditis if clinically indicated. FINDINGS  Left Ventricle: Left ventricular ejection fraction, by estimation, is 40 to 45%. Left ventricular ejection fraction by 3D volume is 41 %. The left ventricle has mildly decreased function. The left ventricle demonstrates regional wall motion abnormalities. The left ventricular internal cavity size was normal in size. There is no left ventricular hypertrophy. Indeterminate diastolic filling due to E-A fusion.  LV Wall Scoring: The inferior wall and posterior wall are akinetic. Right Ventricle: The right ventricular size is normal. No increase in right ventricular wall thickness. Right ventricular systolic function is normal. Tricuspid regurgitation signal is inadequate for assessing PA pressure. Left Atrium: Left atrial size was normal in size. Right Atrium: Right atrial size was normal in  size. Pericardium: There is no evidence of pericardial effusion. Mitral Valve: The mitral valve is grossly normal. Mild mitral valve regurgitation. No evidence of mitral valve stenosis. There is no evidence of mitral valve vegetation. Tricuspid Valve: The tricuspid valve is grossly normal. Tricuspid valve regurgitation is trivial. No evidence of tricuspid stenosis. There is no evidence of tricuspid valve vegetation. Aortic Valve: The aortic valve is tricuspid. There is mild calcification of the aortic valve. Aortic valve regurgitation is not visualized. Aortic valve sclerosis is present, with no evidence of aortic valve stenosis. Aortic valve mean gradient measures 10.0 mmHg. Aortic valve peak gradient measures 19.2 mmHg. Aortic valve area, by VTI measures 1.39 cm. There is no evidence of aortic valve vegetation. Pulmonic Valve: The pulmonic valve was grossly normal. Pulmonic valve regurgitation is not visualized. No evidence of pulmonic stenosis. Aorta: The aortic root is normal in size and structure. Venous: The inferior vena cava is normal in size with greater than 50% respiratory variability, suggesting right atrial pressure of 3 mmHg. IAS/Shunts:  The atrial septum is grossly normal.  LEFT VENTRICLE PLAX 2D LVIDd:         5.10 cm         Diastology LVIDs:         3.80 cm         LV e' medial:    8.95 cm/s LV PW:         1.00 cm         LV E/e' medial:  11.6 LV IVS:        1.20 cm         LV e' lateral:   11.00 cm/s LVOT diam:     1.80 cm         LV E/e' lateral: 9.5 LV SV:         48 LV SV Index:   27 LVOT Area:     2.54 cm        3D Volume EF                                LV 3D EF:    Left                                             ventricul LV Volumes (MOD)                            ar LV vol d, MOD    130.0 ml                   ejection A2C:                                        fraction LV vol d, MOD    122.0 ml                   by 3D A4C:                                        volume is LV vol s, MOD     84.5 ml                    41 %. A2C: LV vol s, MOD    55.3 ml A4C:                           3D Volume EF: LV SV MOD A2C:   45.5 ml       3D EF:        41 % LV SV MOD A4C:   122.0 ml      LV EDV:       178 ml LV SV MOD BP:    61.5 ml       LV ESV:       105 ml                                LV SV:  73 ml RIGHT VENTRICLE RV Basal diam:  3.20 cm RV S prime:     14.50 cm/s TAPSE (M-mode): 1.4 cm LEFT ATRIUM             Index        RIGHT ATRIUM           Index LA diam:        3.90 cm 2.21 cm/m   RA Area:     14.40 cm LA Vol (A2C):   82.0 ml 46.46 ml/m  RA Volume:   33.80 ml  19.15 ml/m LA Vol (A4C):   27.6 ml 15.64 ml/m LA Biplane Vol: 48.1 ml 27.26 ml/m  AORTIC VALVE AV Area (Vmax):    1.32 cm AV Area (Vmean):   1.36 cm AV Area (VTI):     1.39 cm AV Vmax:           219.00 cm/s AV Vmean:          146.000 cm/s AV VTI:            0.347 m AV Peak Grad:      19.2 mmHg AV Mean Grad:      10.0 mmHg LVOT Vmax:         114.00 cm/s LVOT Vmean:        77.900 cm/s LVOT VTI:          0.189 m LVOT/AV VTI ratio: 0.54  AORTA Ao Root diam: 3.30 cm Ao Asc diam:  3.20 cm MITRAL VALVE MV Area (PHT): 5.13 cm     SHUNTS MV Decel Time: 148 msec     Systemic VTI:  0.19 m MV E velocity: 104.00 cm/s  Systemic Diam: 1.80 cm MV A velocity: 148.00 cm/s MV E/A ratio:  0.70 Eleonore Chiquito MD Electronically signed by Eleonore Chiquito MD Signature Date/Time: 09/21/2021/4:23:46 PM    Final    DG ABDOMEN PEG TUBE LOCATION  Result Date: 09/21/2021 CLINICAL DATA:  PEG tube location EXAM: ABDOMEN - 1 VIEW COMPARISON:  None Available. FINDINGS: Contrast is administered via percutaneous gastrostomy tube. Tip is within the distal gastric body, with opacification of the gastric lumen. No extraluminal contrast identified. General paucity of included bowel gas. No obvious free air. IMPRESSION: Contrast is administered via percutaneous gastrostomy tube. Tip is within the distal gastric body, with opacification of the gastric lumen. No  extraluminal contrast identified. Electronically Signed   By: Delanna Ahmadi M.D.   On: 09/21/2021 12:54   CT ABDOMEN PELVIS W CONTRAST  Result Date: 09/20/2021 CLINICAL DATA:  Intra-abdominal abscess, sepsis EXAM: CT ABDOMEN AND PELVIS WITH CONTRAST TECHNIQUE: Multidetector CT imaging of the abdomen and pelvis was performed using the standard protocol following bolus administration of intravenous contrast. RADIATION DOSE REDUCTION: This exam was performed according to the departmental dose-optimization program which includes automated exposure control, adjustment of the mA and/or kV according to patient size and/or use of iterative reconstruction technique. CONTRAST:  59m OMNIPAQUE IOHEXOL 300 MG/ML  SOLN COMPARISON:  08/14/2021 FINDINGS: Lower chest: Small right pleural effusion. Dependent bilateral lower lobe consolidation, left greater than right, consistent with pneumonia and/or atelectasis. Heart is enlarged without pericardial effusion. Hepatobiliary: No focal liver abnormality is seen. No gallstones, gallbladder wall thickening, or biliary dilatation. Pancreas: Unremarkable. No pancreatic ductal dilatation or surrounding inflammatory changes. Spleen: Normal in size without focal abnormality. Adrenals/Urinary Tract: Bladder is decompressed with a Foley catheter. Renals are unremarkable. Kidneys enhance normally without urinary tract calculi or obstruction. Stomach/Bowel: No bowel obstruction or ileus.  No bowel wall thickening or inflammatory change. Percutaneous gastrostomy tube within the gastric lumen Vascular/Lymphatic: Aortic atherosclerosis. No enlarged abdominal or pelvic lymph nodes. Subcentimeter inguinal lymph nodes are likely reactive. Reproductive: Status post hysterectomy. No adnexal masses. Other: Trace pelvic free fluid. No free intraperitoneal gas. No abdominal wall hernia. Musculoskeletal: No acute or destructive bony lesions. Reconstructed images demonstrate no additional findings.  IMPRESSION: 1. Bilateral lower lobe consolidation, left greater than right, favor combination of pneumonia and atelectasis. 2. Small right pleural effusion. 3. Trace pelvic free fluid, nonspecific. 4. Likely reactive bilateral inguinal lymph nodes. No pathologic adenopathy. 5.  Aortic Atherosclerosis (ICD10-I70.0). Electronically Signed   By: Randa Ngo M.D.   On: 09/20/2021 17:18   DG Chest Port 1 View  Result Date: 09/18/2021 CLINICAL DATA:  Questionable sepsis EXAM: PORTABLE CHEST 1 VIEW COMPARISON:  09/10/2021 FINDINGS: Chronic cardiomegaly. Prior CABG. Infiltrate at the left lung base and right mid chest. Trace pleural effusions. Chronic hyperinflation. IMPRESSION: Continued infiltrates in the left lower and right mid chest. Stable trace pleural effusion on the right. Electronically Signed   By: Jorje Guild M.D.   On: 09/30/2021 05:17   CT Angio Chest PE W/Cm &/Or Wo Cm  Result Date: 09/10/2021 CLINICAL DATA:  Hemoptysis.  Ongoing pneumonia. EXAM: CT ANGIOGRAPHY CHEST WITH CONTRAST TECHNIQUE: Multidetector CT imaging of the chest was performed using the standard protocol during bolus administration of intravenous contrast. Multiplanar CT image reconstructions and MIPs were obtained to evaluate the vascular anatomy. Initial IV infiltrated, amount of contrast not documented, patient was evaluated by on-site radiologist. RADIATION DOSE REDUCTION: This exam was performed according to the departmental dose-optimization program which includes automated exposure control, adjustment of the mA and/or kV according to patient size and/or use of iterative reconstruction technique. CONTRAST:  59m OMNIPAQUE IOHEXOL 350 MG/ML SOLN COMPARISON:  Radiograph earlier today, radiograph and CTA 08/24/2021 FINDINGS: Cardiovascular: There are no filling defects within the pulmonary arteries to suggest pulmonary embolus. Atherosclerosis of the thoracic aorta which is mildly tortuous. No aortic aneurysm. Common origin of  brachiocephalic and left common carotid artery, variant arch anatomy. Post CABG. Calcification of native coronary arteries. Upper normal heart size with mild left ventricular dilatation. No pericardial effusion. Mediastinum/Nodes: Scattered small mediastinal and hilar lymph nodes are not enlarged by size criteria. No enlarged lymph nodes in the mediastinal or hilar stations. There are prominent bilateral axillary nodes that are unchanged, all subcentimeter short axis. The esophagus is decompressed. No visualized thyroid nodule Lungs/Pleura: Diminished right pleural effusion from prior CT with small residual. Associated dependent airspace disease in the right lower lobe, also improved. Small perifissural opacity and tree in bud opacities in the right upper lobe. Small dependent opacity in the dependent left lower lobe with adjacent nodular and ground-glass airspace disease anteriorly. Minimal tree in bud opacities in the perifissural left upper lobe. No left pleural effusion. Scattered thin walled pulmonary cysts. No pneumothorax. No pulmonary mass. Minimal retained mucus within the central bronchi. Upper Abdomen: Excretion of IV contrast in both renal collecting systems, possibly related to IV contrast extravasation. No definite acute upper abdominal findings. Musculoskeletal: Contrast extravasation noted in the left upper extremity, likely small volume, although amount of contrast not noted by the technologist. Prior median sternotomy. Thoracic spondylosis with anterior spurring. No acute osseous findings. Review of the MIP images confirms the above findings. IMPRESSION: 1. No pulmonary embolus. 2. Diminished right pleural effusion from prior CT with small residual. Associated dependent airspace disease in the right lower lobe, also  improved. 3. Scattered perifissural and tree in bud opacities in both upper and lower lobes hila suggestive of pneumonia and bronchiolitis, likely infectious or inflammatory. This  includes aspiration. Aortic Atherosclerosis (ICD10-I70.0). Electronically Signed   By: Keith Rake M.D.   On: 09/10/2021 21:24   DG Chest Port 1 View  Result Date: 09/10/2021 CLINICAL DATA:  Questionable sepsis. Currently with pneumonia and hematemesis. EXAM: PORTABLE CHEST 1 VIEW COMPARISON:  Chest x-rays dated 08/24/2021 and 08/23/2021. Chest CT dated 08/24/2021. FINDINGS: Heart size and mediastinal contours are stable. Median sternotomy wires appear grossly intact and stable in alignment. Coarse interstitial markings are again seen throughout both lungs. Small residual RIGHT pleural effusion. Probable mild atelectasis at the LEFT lung base. IMPRESSION: 1. No evidence of pneumonia or pulmonary edema. 2. Small residual RIGHT pleural effusion. 3. Probable mild atelectasis at the LEFT lung base. Electronically Signed   By: Franki Cabot M.D.   On: 09/10/2021 13:09   US THORACENTESIS ASP PLEURAL SPACE W/IMG GUIDE  Result Date: 08/24/2021 INDICATION: Shortness of breath, previous chest x-ray showed right pleural effusion. Request for therapeutic and diagnostic thoracentesis. EXAM: ULTRASOUND GUIDED RIGHT THORACENTESIS MEDICATIONS: 10 mL 1% lidocaine COMPLICATIONS: None immediate. PROCEDURE: An ultrasound guided thoracentesis was thoroughly discussed with the patient and questions answered. The benefits, risks, alternatives and complications were also discussed. The patient understands and wishes to proceed with the procedure. Written consent was obtained. Ultrasound was performed to localize and mark an adequate pocket of fluid in the right chest. The area was then prepped and draped in the normal sterile fashion. 1% Lidocaine was used for local anesthesia. Under ultrasound guidance a 6 Fr Safe-T-Centesis catheter was introduced. Thoracentesis was performed. The catheter was removed and a dressing applied. FINDINGS: A total of approximately 400 mL of hazy yellow fluid was removed. Samples were sent to the  laboratory as requested by the clinical team. Post procedure chest X-ray reviewed, negative for pneumothorax. IMPRESSION: Successful ultrasound guided right thoracentesis yielding 400 mL of pleural fluid. Read by: Durenda Guthrie, [name] Electronically Signed   By: Lucrezia Europe M.D.   On: 08/24/2021 13:48   DG Chest 1 View  Result Date: 08/24/2021 CLINICAL DATA:  S/p right Tobias Alexander [id number] EXAM: CHEST  1 VIEW COMPARISON:  Radiograph 08/23/2021 FINDINGS: Reduction in RIGHT pleural fluid. No pneumothorax. Stable enlarged cardiac silhouette. Persistent mild RIGHT effusion. IMPRESSION: 1. No pneumothorax. 2. Reduction in RIGHT pleural fluid Electronically Signed   By: Suzy Bouchard M.D.   On: 08/24/2021 13:20    Microbiology: Recent Results (from the past 240 hour(s))  Blood Culture (routine x 2)     Status: None (Preliminary result)   Collection Time: 10/09/2021  4:58 AM   Specimen: BLOOD  Result Value Ref Range Status   Specimen Description BLOOD LEFT ANTECUBITAL  Final   Special Requests   Final    BOTTLES DRAWN AEROBIC AND ANAEROBIC Blood Culture results may not be optimal due to an excessive volume of blood received in culture bottles   Culture   Final    NO GROWTH 4 DAYS Performed at [facility], [place] [address] [place], [place] [address]    Report Status PENDING  Incomplete  Blood Culture (routine x 2)     Status: Abnormal   Collection Time: 09/27/2021  5:41 AM   Specimen: BLOOD  Result Value Ref Range Status   Specimen Description BLOOD SITE NOT SPECIFIED  Final   Special Requests   Final    BOTTLES  DRAWN AEROBIC AND ANAEROBIC Blood Culture adequate volume   Culture  Setup Time   Final    GRAM POSITIVE COCCI AEROBIC BOTTLE ONLY CRITICAL RESULT CALLED TO, READ BACK BY AND VERIFIED WITH: T RUDISILL,PHARMD'@0605'$  09/20/21 [place] Performed at [facility], [address] [place], [place] [address]    Culture METHICILLIN RESISTANT STAPHYLOCOCCUS AUREUS (A)  Final   Report Status  09/22/2021 FINAL  Final   Organism ID, Bacteria METHICILLIN RESISTANT STAPHYLOCOCCUS AUREUS  Final      Susceptibility   Methicillin resistant staphylococcus aureus - MIC*    CIPROFLOXACIN >=8 RESISTANT Resistant     ERYTHROMYCIN >=8 RESISTANT Resistant     GENTAMICIN <=0.5 SENSITIVE Sensitive     OXACILLIN >=4 RESISTANT Resistant     TETRACYCLINE <=1 SENSITIVE Sensitive     VANCOMYCIN 1 SENSITIVE Sensitive     TRIMETH/SULFA >=320 RESISTANT Resistant     CLINDAMYCIN <=0.25 SENSITIVE Sensitive     RIFAMPIN <=0.5 SENSITIVE Sensitive     Inducible Clindamycin NEGATIVE Sensitive     * METHICILLIN RESISTANT STAPHYLOCOCCUS AUREUS  Blood Culture ID Panel (Reflexed)     Status: Abnormal   Collection Time: 10/09/2021  5:41 AM  Result Value Ref Range Status   Enterococcus faecalis NOT DETECTED NOT DETECTED Final   Enterococcus Faecium NOT DETECTED NOT DETECTED Final   Listeria monocytogenes NOT DETECTED NOT DETECTED Final   Staphylococcus species DETECTED (A) NOT DETECTED Final    Comment: CRITICAL RESULT CALLED TO, READ BACK BY AND VERIFIED WITH: T RUDISILL,PHARMD'@0605'$  09/20/21 [place]    Staphylococcus aureus (BCID) DETECTED (A) NOT DETECTED Final    Comment: Methicillin (oxacillin)-resistant Staphylococcus aureus (MRSA). MRSA is predictably resistant to beta-lactam antibiotics (except ceftaroline). Preferred therapy is vancomycin unless clinically contraindicated. Patient requires contact precautions if  hospitalized. CRITICAL RESULT CALLED TO, READ BACK BY AND VERIFIED WITH: T RUDISILL,PHARMD'@0605'$  09/20/21 [place]    Staphylococcus epidermidis NOT DETECTED NOT DETECTED Final   Staphylococcus lugdunensis NOT DETECTED NOT DETECTED Final   Streptococcus species NOT DETECTED NOT DETECTED Final   Streptococcus agalactiae NOT DETECTED NOT DETECTED Final   Streptococcus pneumoniae NOT DETECTED NOT DETECTED Final   Streptococcus pyogenes NOT DETECTED NOT DETECTED Final   A.calcoaceticus-baumannii NOT  DETECTED NOT DETECTED Final   Bacteroides fragilis NOT DETECTED NOT DETECTED Final   Enterobacterales NOT DETECTED NOT DETECTED Final   Enterobacter cloacae complex NOT DETECTED NOT DETECTED Final   Escherichia coli NOT DETECTED NOT DETECTED Final   Klebsiella aerogenes NOT DETECTED NOT DETECTED Final   Klebsiella oxytoca NOT DETECTED NOT DETECTED Final   Klebsiella pneumoniae NOT DETECTED NOT DETECTED Final   Proteus species NOT DETECTED NOT DETECTED Final   Salmonella species NOT DETECTED NOT DETECTED Final   Serratia marcescens NOT DETECTED NOT DETECTED Final   Haemophilus influenzae NOT DETECTED NOT DETECTED Final   Neisseria meningitidis NOT DETECTED NOT DETECTED Final   Pseudomonas aeruginosa NOT DETECTED NOT DETECTED Final   Stenotrophomonas maltophilia NOT DETECTED NOT DETECTED Final   Candida albicans NOT DETECTED NOT DETECTED Final   Candida auris NOT DETECTED NOT DETECTED Final   Candida glabrata NOT DETECTED NOT DETECTED Final   Candida krusei NOT DETECTED NOT DETECTED Final   Candida parapsilosis NOT DETECTED NOT DETECTED Final   Candida tropicalis NOT DETECTED NOT DETECTED Final   Cryptococcus neoformans/gattii NOT DETECTED NOT DETECTED Final   Meth resistant mecA/C and MREJ DETECTED (A) NOT DETECTED Final    Comment: CRITICAL RESULT CALLED TO, READ BACK BY AND  VERIFIED WITH: T RUDISILL,PHARMD'@0605'$  09/20/21 [place] Performed at [facility], [place] [address], [place], [place] [address]   Urine Culture     Status: None   Collection Time: 10/05/2021  8:03 AM   Specimen: In/Out Cath Urine  Result Value Ref Range Status   Specimen Description IN/OUT CATH URINE  Final   Special Requests NONE  Final   Culture   Final    NO GROWTH Performed at [facility], [place] [address], [place], [place] [address]    Report Status 09/20/2021 FINAL  Final  C Difficile Quick Screen w PCR reflex     Status: None   Collection Time: 09/20/21 12:18 PM   Specimen: STOOL  Result Value Ref  Range Status   C Diff antigen NEGATIVE NEGATIVE Final   C Diff toxin NEGATIVE NEGATIVE Final   C Diff interpretation No C. difficile detected.  Final    Comment: NEGATIVE Performed at [facility], [place] [address] [place], [place] [address]   MRSA Next Gen by PCR, Nasal     Status: Abnormal   Collection Time: 09/20/21 12:54 PM   Specimen: Nasal Mucosa; Nasal Swab  Result Value Ref Range Status   MRSA by PCR Next Gen DETECTED (A) NOT DETECTED Final    Comment: RESULT CALLED TO, READ BACK BY AND VERIFIED WITH: RN Saul Fordyce [phone] @ [phone] FH  (NOTE) The GeneXpert MRSA Assay (FDA approved for NASAL specimens only), is one component of a comprehensive MRSA colonization surveillance program. It is not intended to diagnose MRSA infection nor to guide or monitor treatment for MRSA infections. Test performance is not FDA approved in patients less than 32 years old. Performed at [facility], [place] [address] [place], [place] [address]   Culture, blood (Routine X 2) w Reflex to ID Panel     Status: None (Preliminary result)   Collection Time: 09/21/21  4:40 AM   Specimen: BLOOD LEFT HAND  Result Value Ref Range Status   Specimen Description BLOOD LEFT HAND  Final   Special Requests IN PEDIATRIC BOTTLE Blood Culture adequate volume  Final   Culture   Final    NO GROWTH 2 DAYS Performed at [facility], [place] [address] [place], [place] [address]    Report Status PENDING  Incomplete  Culture, blood (Routine X 2) w Reflex to ID Panel     Status: None (Preliminary result)   Collection Time: 09/21/21  4:53 AM   Specimen: BLOOD  Result Value Ref Range Status   Specimen Description BLOOD RIGHT ANTECUBITAL  Final   Special Requests IN PEDIATRIC BOTTLE Blood Culture adequate volume  Final   Culture   Final    NO GROWTH 2 DAYS Performed at [facility], [place] [address] [place],  [address]    Report Status PENDING  Incomplete    Time spent: 30  minutes  Signed: Darliss Cheney, MD 09/26/2021

## 2021-10-15 NOTE — Progress Notes (Signed)
Nutrition Brief Note  Chart reviewed. Pt has now transitioned to comfort care.    No further nutrition interventions planned at this time. Please re-consult as needed.   Hermina Barters RD, LDN Clinical Dietitian See Shea Evans for contact information.

## 2021-10-15 NOTE — Plan of Care (Signed)
  Problem: Activity: Goal: Ability to tolerate increased activity will improve Outcome: Progressing   Problem: Activity: Goal: Ability to tolerate increased activity will improve Outcome: Progressing   Problem: Clinical Measurements: Goal: Ability to maintain a body temperature in the normal range will improve Outcome: Progressing   Problem: Respiratory: Goal: Ability to maintain adequate ventilation will improve Outcome: Progressing Goal: Ability to maintain a clear airway will improve Outcome: Progressing   Problem: Education: Goal: Ability to describe self-care measures that may prevent or decrease complications (Diabetes Survival Skills Education) will improve Outcome: Progressing Goal: Individualized Educational Video(s) Outcome: Progressing   Problem: Coping: Goal: Ability to adjust to condition or change in health will improve Outcome: Progressing   Problem: Fluid Volume: Goal: Ability to maintain a balanced intake and output will improve Outcome: Progressing

## 2021-10-15 DEATH — deceased

## 2022-04-07 ENCOUNTER — Other Ambulatory Visit (HOSPITAL_COMMUNITY): Payer: Self-pay
